# Patient Record
Sex: Female | Born: 1965
Health system: Southern US, Community
[De-identification: ages and names within clinical notes are randomized; demographics above are authoritative.]

## PROBLEM LIST (undated history)

## (undated) DIAGNOSIS — M797 Fibromyalgia: Secondary | ICD-10-CM

## (undated) DIAGNOSIS — W19XXXA Unspecified fall, initial encounter: Secondary | ICD-10-CM

## (undated) DIAGNOSIS — IMO0001 Reserved for inherently not codable concepts without codable children: Secondary | ICD-10-CM

## (undated) DIAGNOSIS — E669 Obesity, unspecified: Secondary | ICD-10-CM

## (undated) DIAGNOSIS — S0300XA Dislocation of jaw, unspecified side, initial encounter: Secondary | ICD-10-CM

## (undated) DIAGNOSIS — F40240 Claustrophobia: Secondary | ICD-10-CM

## (undated) DIAGNOSIS — N390 Urinary tract infection, site not specified: Secondary | ICD-10-CM

## (undated) DIAGNOSIS — N200 Calculus of kidney: Secondary | ICD-10-CM

## (undated) DIAGNOSIS — R000 Tachycardia, unspecified: Secondary | ICD-10-CM

## (undated) DIAGNOSIS — M1990 Unspecified osteoarthritis, unspecified site: Secondary | ICD-10-CM

## (undated) DIAGNOSIS — K219 Gastro-esophageal reflux disease without esophagitis: Secondary | ICD-10-CM

## (undated) DIAGNOSIS — J45909 Unspecified asthma, uncomplicated: Secondary | ICD-10-CM

## (undated) DIAGNOSIS — E119 Type 2 diabetes mellitus without complications: Secondary | ICD-10-CM

## (undated) DIAGNOSIS — D649 Anemia, unspecified: Secondary | ICD-10-CM

## (undated) DIAGNOSIS — E282 Polycystic ovarian syndrome: Secondary | ICD-10-CM

## (undated) DIAGNOSIS — F419 Anxiety disorder, unspecified: Secondary | ICD-10-CM

## (undated) DIAGNOSIS — F32A Depression, unspecified: Secondary | ICD-10-CM

## (undated) DIAGNOSIS — T7840XA Allergy, unspecified, initial encounter: Secondary | ICD-10-CM

## (undated) DIAGNOSIS — R61 Generalized hyperhidrosis: Secondary | ICD-10-CM

## (undated) DIAGNOSIS — F329 Major depressive disorder, single episode, unspecified: Secondary | ICD-10-CM

## (undated) DIAGNOSIS — K760 Fatty (change of) liver, not elsewhere classified: Secondary | ICD-10-CM

## (undated) DIAGNOSIS — R82993 Hyperuricosuria: Secondary | ICD-10-CM

## (undated) DIAGNOSIS — G4730 Sleep apnea, unspecified: Secondary | ICD-10-CM

## (undated) DIAGNOSIS — I890 Lymphedema, not elsewhere classified: Secondary | ICD-10-CM

## (undated) DIAGNOSIS — E785 Hyperlipidemia, unspecified: Secondary | ICD-10-CM

## (undated) HISTORY — PX: ESOPHAGOGASTRODUODENOSCOPY: SHX1529

## (undated) HISTORY — DX: Allergy, unspecified, initial encounter: T78.40XA

## (undated) HISTORY — PX: SEPTOPLASTY: SUR1290

## (undated) HISTORY — DX: Hyperlipidemia, unspecified: E78.5

## (undated) HISTORY — DX: Obesity, unspecified: E66.9

## (undated) HISTORY — PX: TONSILLECTOMY: SUR1361

## (undated) HISTORY — DX: Polycystic ovarian syndrome: E28.2

## (undated) HISTORY — DX: Unspecified fall, initial encounter: W19.XXXA

## (undated) HISTORY — PX: VAGUS NERVE STIMULATOR INSERTION: SHX348

## (undated) HISTORY — DX: Type 2 diabetes mellitus without complications: E11.9

## (undated) HISTORY — DX: Tachycardia, unspecified: R00.0

## (undated) HISTORY — DX: Fatty (change of) liver, not elsewhere classified: K76.0

## (undated) HISTORY — DX: Depression, unspecified: F32.A

## (undated) HISTORY — DX: Gastro-esophageal reflux disease without esophagitis: K21.9

## (undated) HISTORY — DX: Unspecified osteoarthritis, unspecified site: M19.90

## (undated) HISTORY — DX: Anxiety disorder, unspecified: F41.9

## (undated) HISTORY — DX: Lymphedema, not elsewhere classified: I89.0

## (undated) HISTORY — DX: Generalized hyperhidrosis: R61

## (undated) HISTORY — DX: Sleep apnea, unspecified: G47.30

## (undated) HISTORY — DX: Major depressive disorder, single episode, unspecified: F32.9

## (undated) HISTORY — PX: FOOT SURGERY: SHX648

---

## 1999-08-03 ENCOUNTER — Encounter: Payer: Self-pay | Admitting: Family Medicine

## 1999-08-03 ENCOUNTER — Encounter: Payer: Self-pay | Admitting: Emergency Medicine

## 1999-08-03 ENCOUNTER — Emergency Department (HOSPITAL_COMMUNITY): Admission: EM | Admit: 1999-08-03 | Discharge: 1999-08-03 | Payer: Self-pay | Admitting: Emergency Medicine

## 2001-07-04 ENCOUNTER — Encounter: Payer: Self-pay | Admitting: Family Medicine

## 2001-07-04 ENCOUNTER — Other Ambulatory Visit: Admission: RE | Admit: 2001-07-04 | Discharge: 2001-07-04 | Payer: Self-pay | Admitting: Family Medicine

## 2001-07-04 LAB — CONVERTED CEMR LAB: Pap Smear: NORMAL

## 2001-09-12 HISTORY — PX: OTHER SURGICAL HISTORY: SHX169

## 2001-10-14 ENCOUNTER — Encounter: Payer: Self-pay | Admitting: Gastroenterology

## 2001-10-17 ENCOUNTER — Ambulatory Visit (HOSPITAL_COMMUNITY): Admission: RE | Admit: 2001-10-17 | Discharge: 2001-10-17 | Payer: Self-pay | Admitting: Gastroenterology

## 2001-10-17 ENCOUNTER — Encounter: Payer: Self-pay | Admitting: Gastroenterology

## 2002-12-05 ENCOUNTER — Inpatient Hospital Stay (HOSPITAL_COMMUNITY): Admission: AD | Admit: 2002-12-05 | Discharge: 2002-12-07 | Payer: Self-pay | Admitting: Obstetrics and Gynecology

## 2002-12-05 ENCOUNTER — Encounter: Payer: Self-pay | Admitting: Obstetrics and Gynecology

## 2003-07-20 ENCOUNTER — Other Ambulatory Visit: Payer: Self-pay

## 2003-12-28 ENCOUNTER — Ambulatory Visit: Payer: Self-pay | Admitting: Family Medicine

## 2004-01-13 HISTORY — PX: ENDOMETRIAL BIOPSY: SHX622

## 2004-02-01 ENCOUNTER — Ambulatory Visit: Payer: Self-pay | Admitting: Family Medicine

## 2004-02-23 ENCOUNTER — Ambulatory Visit: Payer: Self-pay | Admitting: Family Medicine

## 2004-05-31 ENCOUNTER — Ambulatory Visit: Payer: Self-pay | Admitting: Family Medicine

## 2004-06-30 ENCOUNTER — Ambulatory Visit: Payer: Self-pay | Admitting: Family Medicine

## 2004-10-10 ENCOUNTER — Ambulatory Visit: Payer: Self-pay | Admitting: Family Medicine

## 2004-12-27 ENCOUNTER — Ambulatory Visit: Payer: Self-pay | Admitting: Internal Medicine

## 2005-01-03 ENCOUNTER — Ambulatory Visit: Payer: Self-pay | Admitting: Family Medicine

## 2005-01-31 ENCOUNTER — Ambulatory Visit: Payer: Self-pay | Admitting: Family Medicine

## 2005-02-02 ENCOUNTER — Ambulatory Visit: Payer: Self-pay | Admitting: Otolaryngology

## 2005-04-12 ENCOUNTER — Ambulatory Visit: Payer: Self-pay | Admitting: Family Medicine

## 2005-05-08 ENCOUNTER — Ambulatory Visit: Payer: Self-pay | Admitting: Family Medicine

## 2005-05-31 ENCOUNTER — Ambulatory Visit: Payer: Self-pay | Admitting: Family Medicine

## 2005-06-12 HISTORY — PX: UTERINE FIBROID SURGERY: SHX826

## 2005-06-15 ENCOUNTER — Ambulatory Visit: Payer: Self-pay | Admitting: Unknown Physician Specialty

## 2005-09-13 ENCOUNTER — Ambulatory Visit: Payer: Self-pay | Admitting: Family Medicine

## 2006-01-08 ENCOUNTER — Ambulatory Visit: Payer: Self-pay | Admitting: Family Medicine

## 2006-03-12 ENCOUNTER — Ambulatory Visit: Payer: Self-pay | Admitting: Family Medicine

## 2006-03-12 LAB — CONVERTED CEMR LAB
ALT: 11 units/L (ref 0–40)
AST: 17 units/L (ref 0–37)
Cholesterol: 156 mg/dL (ref 0–200)
Direct LDL: 77.7 mg/dL
HDL: 53.2 mg/dL (ref >39.0)
Hgb A1c MFr Bld: 5.6 % (ref 4.6–6.0)
Total CHOL/HDL Ratio: 2.9
Triglycerides: 213 mg/dL (ref 0–149)
VLDL: 43 mg/dL — ABNORMAL HIGH (ref 0–40)

## 2006-04-23 ENCOUNTER — Ambulatory Visit: Payer: Self-pay | Admitting: Family Medicine

## 2006-05-06 ENCOUNTER — Encounter: Payer: Self-pay | Admitting: Family Medicine

## 2006-05-14 ENCOUNTER — Encounter: Payer: Self-pay | Admitting: Family Medicine

## 2006-08-07 ENCOUNTER — Ambulatory Visit: Payer: Self-pay | Admitting: Otolaryngology

## 2006-10-16 ENCOUNTER — Encounter: Payer: Self-pay | Admitting: Family Medicine

## 2006-10-16 ENCOUNTER — Ambulatory Visit: Payer: Self-pay | Admitting: Pulmonary Disease

## 2006-10-16 DIAGNOSIS — F502 Bulimia nervosa, unspecified: Secondary | ICD-10-CM | POA: Insufficient documentation

## 2006-10-16 DIAGNOSIS — J4520 Mild intermittent asthma, uncomplicated: Secondary | ICD-10-CM | POA: Insufficient documentation

## 2006-10-16 DIAGNOSIS — G5600 Carpal tunnel syndrome, unspecified upper limb: Secondary | ICD-10-CM

## 2006-10-16 DIAGNOSIS — E282 Polycystic ovarian syndrome: Secondary | ICD-10-CM | POA: Insufficient documentation

## 2006-10-16 DIAGNOSIS — M26609 Unspecified temporomandibular joint disorder, unspecified side: Secondary | ICD-10-CM | POA: Insufficient documentation

## 2006-10-16 DIAGNOSIS — M1990 Unspecified osteoarthritis, unspecified site: Secondary | ICD-10-CM | POA: Insufficient documentation

## 2006-10-16 DIAGNOSIS — I890 Lymphedema, not elsewhere classified: Secondary | ICD-10-CM | POA: Insufficient documentation

## 2006-10-16 DIAGNOSIS — G44209 Tension-type headache, unspecified, not intractable: Secondary | ICD-10-CM

## 2006-10-16 DIAGNOSIS — K589 Irritable bowel syndrome without diarrhea: Secondary | ICD-10-CM

## 2006-10-16 DIAGNOSIS — J309 Allergic rhinitis, unspecified: Secondary | ICD-10-CM

## 2006-10-16 DIAGNOSIS — K219 Gastro-esophageal reflux disease without esophagitis: Secondary | ICD-10-CM | POA: Insufficient documentation

## 2006-10-16 DIAGNOSIS — R05 Cough: Secondary | ICD-10-CM

## 2006-10-16 DIAGNOSIS — E785 Hyperlipidemia, unspecified: Secondary | ICD-10-CM

## 2006-10-16 DIAGNOSIS — IMO0001 Reserved for inherently not codable concepts without codable children: Secondary | ICD-10-CM

## 2006-10-16 DIAGNOSIS — F410 Panic disorder [episodic paroxysmal anxiety] without agoraphobia: Secondary | ICD-10-CM

## 2006-10-16 DIAGNOSIS — F411 Generalized anxiety disorder: Secondary | ICD-10-CM

## 2006-10-16 DIAGNOSIS — Z6841 Body Mass Index (BMI) 40.0 and over, adult: Secondary | ICD-10-CM

## 2006-10-16 DIAGNOSIS — F329 Major depressive disorder, single episode, unspecified: Secondary | ICD-10-CM | POA: Insufficient documentation

## 2006-10-30 ENCOUNTER — Ambulatory Visit: Payer: Self-pay | Admitting: Family Medicine

## 2006-10-31 ENCOUNTER — Encounter: Payer: Self-pay | Admitting: Family Medicine

## 2006-11-15 DIAGNOSIS — R5382 Chronic fatigue, unspecified: Secondary | ICD-10-CM

## 2006-11-15 DIAGNOSIS — Z8719 Personal history of other diseases of the digestive system: Secondary | ICD-10-CM

## 2006-11-28 ENCOUNTER — Ambulatory Visit: Payer: Self-pay | Admitting: Pulmonary Disease

## 2006-12-23 ENCOUNTER — Telehealth: Payer: Self-pay | Admitting: Family Medicine

## 2007-02-11 ENCOUNTER — Telehealth: Payer: Self-pay | Admitting: Family Medicine

## 2007-05-09 ENCOUNTER — Encounter: Payer: Self-pay | Admitting: Family Medicine

## 2007-05-12 ENCOUNTER — Telehealth (INDEPENDENT_AMBULATORY_CARE_PROVIDER_SITE_OTHER): Payer: Self-pay | Admitting: *Deleted

## 2007-05-14 ENCOUNTER — Encounter: Payer: Self-pay | Admitting: Family Medicine

## 2007-05-22 ENCOUNTER — Encounter: Payer: Self-pay | Admitting: Family Medicine

## 2007-05-22 ENCOUNTER — Telehealth: Payer: Self-pay | Admitting: Family Medicine

## 2007-05-29 ENCOUNTER — Ambulatory Visit: Payer: Self-pay | Admitting: Family Medicine

## 2007-05-30 LAB — CONVERTED CEMR LAB
Basophils Absolute: 0.1 10*3/uL
Basophils Relative: 0.7 %
Eosinophils Absolute: 0.1 10*3/uL
Eosinophils Relative: 1.9 %
Folate: 5.4 ng/mL
Free T4: 0.7 ng/dL
HCT: 37.8 %
Hemoglobin: 12.8 g/dL
Hgb A1c MFr Bld: 5.8 %
Lymphocytes Relative: 26 %
MCHC: 33.8 g/dL
MCV: 93.4 fL
Monocytes Absolute: 0.4 10*3/uL
Monocytes Relative: 5.8 %
Neutro Abs: 5.1 10*3/uL
Neutrophils Relative %: 65.6 %
Platelets: 231 10*3/uL
RBC: 4.05 M/uL
RDW: 13 %
TSH: 3.67 u[IU]/mL
Vitamin B-12: 140 pg/mL — ABNORMAL LOW
WBC: 7.7 10*3/uL

## 2007-06-04 ENCOUNTER — Ambulatory Visit: Payer: Self-pay | Admitting: Family Medicine

## 2007-06-04 DIAGNOSIS — E538 Deficiency of other specified B group vitamins: Secondary | ICD-10-CM

## 2007-06-10 ENCOUNTER — Telehealth (INDEPENDENT_AMBULATORY_CARE_PROVIDER_SITE_OTHER): Payer: Self-pay | Admitting: *Deleted

## 2007-06-12 ENCOUNTER — Telehealth: Payer: Self-pay | Admitting: Family Medicine

## 2007-06-25 ENCOUNTER — Emergency Department (HOSPITAL_COMMUNITY): Admission: EM | Admit: 2007-06-25 | Discharge: 2007-06-25 | Payer: Self-pay | Admitting: Emergency Medicine

## 2007-06-27 ENCOUNTER — Encounter: Payer: Self-pay | Admitting: Family Medicine

## 2007-07-17 ENCOUNTER — Ambulatory Visit: Payer: Self-pay | Admitting: Family Medicine

## 2007-07-17 ENCOUNTER — Telehealth: Payer: Self-pay | Admitting: Family Medicine

## 2007-09-12 ENCOUNTER — Telehealth (INDEPENDENT_AMBULATORY_CARE_PROVIDER_SITE_OTHER): Payer: Self-pay | Admitting: *Deleted

## 2007-10-10 ENCOUNTER — Telehealth (INDEPENDENT_AMBULATORY_CARE_PROVIDER_SITE_OTHER): Payer: Self-pay | Admitting: *Deleted

## 2007-10-13 ENCOUNTER — Encounter: Payer: Self-pay | Admitting: Family Medicine

## 2007-11-02 ENCOUNTER — Encounter: Payer: Self-pay | Admitting: Pulmonary Disease

## 2007-12-30 ENCOUNTER — Ambulatory Visit: Payer: Self-pay | Admitting: Family Medicine

## 2008-01-01 ENCOUNTER — Telehealth: Payer: Self-pay | Admitting: Family Medicine

## 2008-02-16 ENCOUNTER — Telehealth (INDEPENDENT_AMBULATORY_CARE_PROVIDER_SITE_OTHER): Payer: Self-pay | Admitting: *Deleted

## 2008-04-14 ENCOUNTER — Telehealth: Payer: Self-pay | Admitting: Family Medicine

## 2008-05-07 ENCOUNTER — Ambulatory Visit: Payer: Self-pay | Admitting: Family Medicine

## 2008-05-07 DIAGNOSIS — E559 Vitamin D deficiency, unspecified: Secondary | ICD-10-CM | POA: Insufficient documentation

## 2008-05-17 ENCOUNTER — Ambulatory Visit: Payer: Self-pay | Admitting: Family Medicine

## 2008-05-18 LAB — CONVERTED CEMR LAB: Vit D, 25-Hydroxy: 15 ng/mL — ABNORMAL LOW (ref 30–89)

## 2008-05-19 LAB — CONVERTED CEMR LAB
ALT: 13 units/L (ref 0–35)
AST: 18 units/L (ref 0–37)
Albumin: 3.3 g/dL — ABNORMAL LOW (ref 3.5–5.2)
Alkaline Phosphatase: 59 units/L (ref 39–117)
BUN: 13 mg/dL (ref 6–23)
Basophils Absolute: 0 10*3/uL (ref 0.0–0.1)
Basophils Relative: 0.6 % (ref 0.0–3.0)
Bilirubin, Direct: 0.1 mg/dL (ref 0.0–0.3)
CO2: 25 meq/L (ref 19–32)
Calcium: 9.4 mg/dL (ref 8.4–10.5)
Chloride: 110 meq/L (ref 96–112)
Cholesterol: 145 mg/dL (ref 0–200)
Creatinine, Ser: 0.9 mg/dL (ref 0.4–1.2)
Eosinophils Absolute: 0.2 10*3/uL (ref 0.0–0.7)
Eosinophils Relative: 2.9 % (ref 0.0–5.0)
GFR calc non Af Amer: 72.69 mL/min (ref >60)
Glucose, Bld: 142 mg/dL — ABNORMAL HIGH (ref 70–99)
HCT: 40.3 % (ref 36.0–46.0)
HDL: 43.4 mg/dL (ref >39.00)
Hemoglobin: 13.3 g/dL (ref 12.0–15.0)
Hgb A1c MFr Bld: 6.8 % — ABNORMAL HIGH (ref 4.6–6.5)
LDL Cholesterol: 68 mg/dL (ref 0–99)
Lymphocytes Relative: 25.3 % (ref 12.0–46.0)
Lymphs Abs: 1.5 10*3/uL (ref 0.7–4.0)
MCHC: 33 g/dL (ref 30.0–36.0)
MCV: 93.6 fL (ref 78.0–100.0)
Monocytes Absolute: 0.4 10*3/uL (ref 0.1–1.0)
Monocytes Relative: 6 % (ref 3.0–12.0)
Neutro Abs: 3.8 10*3/uL (ref 1.4–7.7)
Neutrophils Relative %: 65.2 % (ref 43.0–77.0)
Platelets: 205 10*3/uL (ref 150.0–400.0)
Potassium: 4.5 meq/L (ref 3.5–5.1)
RBC: 4.31 M/uL (ref 3.87–5.11)
RDW: 13.1 % (ref 11.5–14.6)
Sodium: 142 meq/L (ref 135–145)
TSH: 3.45 microintl units/mL (ref 0.35–5.50)
Total Bilirubin: 0.4 mg/dL (ref 0.3–1.2)
Total CHOL/HDL Ratio: 3
Total Protein: 5.9 g/dL — ABNORMAL LOW (ref 6.0–8.3)
Triglycerides: 169 mg/dL — ABNORMAL HIGH (ref 0.0–149.0)
VLDL: 33.8 mg/dL (ref 0.0–40.0)
Vitamin B-12: 403 pg/mL (ref 211–911)
WBC: 5.9 10*3/uL (ref 4.5–10.5)

## 2008-06-01 ENCOUNTER — Encounter: Payer: Self-pay | Admitting: Family Medicine

## 2008-06-04 ENCOUNTER — Telehealth: Payer: Self-pay | Admitting: Family Medicine

## 2008-06-21 ENCOUNTER — Ambulatory Visit: Payer: Self-pay | Admitting: Family Medicine

## 2008-07-13 DIAGNOSIS — K760 Fatty (change of) liver, not elsewhere classified: Secondary | ICD-10-CM

## 2008-07-13 HISTORY — DX: Fatty (change of) liver, not elsewhere classified: K76.0

## 2008-07-29 ENCOUNTER — Ambulatory Visit: Payer: Self-pay | Admitting: Family Medicine

## 2008-07-29 DIAGNOSIS — K6289 Other specified diseases of anus and rectum: Secondary | ICD-10-CM

## 2008-07-29 DIAGNOSIS — R197 Diarrhea, unspecified: Secondary | ICD-10-CM

## 2008-08-10 ENCOUNTER — Encounter: Payer: Self-pay | Admitting: Family Medicine

## 2008-08-11 ENCOUNTER — Ambulatory Visit: Payer: Self-pay | Admitting: Family Medicine

## 2008-08-12 ENCOUNTER — Encounter: Payer: Self-pay | Admitting: Family Medicine

## 2008-08-19 ENCOUNTER — Telehealth (INDEPENDENT_AMBULATORY_CARE_PROVIDER_SITE_OTHER): Payer: Self-pay | Admitting: *Deleted

## 2008-08-19 ENCOUNTER — Telehealth: Payer: Self-pay | Admitting: Gastroenterology

## 2008-08-19 ENCOUNTER — Telehealth: Payer: Self-pay | Admitting: Family Medicine

## 2008-08-20 ENCOUNTER — Encounter: Payer: Self-pay | Admitting: Family Medicine

## 2008-08-23 ENCOUNTER — Telehealth: Payer: Self-pay | Admitting: Family Medicine

## 2008-08-23 ENCOUNTER — Ambulatory Visit: Payer: Self-pay | Admitting: Family Medicine

## 2008-08-23 DIAGNOSIS — E119 Type 2 diabetes mellitus without complications: Secondary | ICD-10-CM | POA: Insufficient documentation

## 2008-08-24 ENCOUNTER — Encounter: Payer: Self-pay | Admitting: Family Medicine

## 2008-08-24 LAB — CONVERTED CEMR LAB
AST: 26 units/L (ref 0–37)
CO2: 27 meq/L (ref 19–32)
Calcium: 8.6 mg/dL (ref 8.4–10.5)
HDL: 42.3 mg/dL (ref >39.00)
Hgb A1c MFr Bld: 6.4 % (ref 4.6–6.5)
Potassium: 4.3 meq/L (ref 3.5–5.1)
Sodium: 142 meq/L (ref 135–145)

## 2008-08-25 ENCOUNTER — Encounter: Payer: Self-pay | Admitting: Family Medicine

## 2008-08-26 ENCOUNTER — Telehealth: Payer: Self-pay | Admitting: Family Medicine

## 2008-09-02 ENCOUNTER — Ambulatory Visit: Payer: Self-pay | Admitting: Unknown Physician Specialty

## 2008-09-02 ENCOUNTER — Encounter: Payer: Self-pay | Admitting: Family Medicine

## 2008-09-10 ENCOUNTER — Ambulatory Visit: Payer: Self-pay | Admitting: Family Medicine

## 2008-09-10 ENCOUNTER — Encounter (INDEPENDENT_AMBULATORY_CARE_PROVIDER_SITE_OTHER): Payer: Self-pay | Admitting: Internal Medicine

## 2008-09-15 ENCOUNTER — Encounter: Payer: Self-pay | Admitting: Family Medicine

## 2008-11-26 ENCOUNTER — Ambulatory Visit: Payer: Self-pay | Admitting: Internal Medicine

## 2008-11-26 LAB — CONVERTED CEMR LAB: Rapid Strep: POSITIVE

## 2008-12-17 ENCOUNTER — Telehealth: Payer: Self-pay | Admitting: Family Medicine

## 2008-12-22 ENCOUNTER — Ambulatory Visit: Payer: Self-pay | Admitting: Family Medicine

## 2008-12-22 ENCOUNTER — Telehealth: Payer: Self-pay | Admitting: Family Medicine

## 2008-12-23 ENCOUNTER — Telehealth: Payer: Self-pay | Admitting: Family Medicine

## 2008-12-24 ENCOUNTER — Ambulatory Visit: Payer: Self-pay | Admitting: Family Medicine

## 2008-12-31 LAB — CONVERTED CEMR LAB: Potassium: 4.8 meq/L (ref 3.5–5.1)

## 2009-01-11 ENCOUNTER — Ambulatory Visit: Payer: Self-pay | Admitting: Family Medicine

## 2009-01-18 ENCOUNTER — Ambulatory Visit: Payer: Self-pay | Admitting: Family Medicine

## 2009-01-21 ENCOUNTER — Ambulatory Visit: Payer: Self-pay | Admitting: Cardiology

## 2009-01-25 ENCOUNTER — Ambulatory Visit: Payer: Self-pay

## 2009-01-25 ENCOUNTER — Encounter: Payer: Self-pay | Admitting: Cardiology

## 2009-02-07 ENCOUNTER — Ambulatory Visit: Payer: Self-pay | Admitting: Cardiology

## 2009-02-07 ENCOUNTER — Encounter: Payer: Self-pay | Admitting: Cardiology

## 2009-02-14 ENCOUNTER — Encounter: Payer: Self-pay | Admitting: Cardiology

## 2009-03-04 ENCOUNTER — Telehealth: Payer: Self-pay | Admitting: Family Medicine

## 2009-03-07 ENCOUNTER — Telehealth (INDEPENDENT_AMBULATORY_CARE_PROVIDER_SITE_OTHER): Payer: Self-pay | Admitting: *Deleted

## 2009-03-07 ENCOUNTER — Ambulatory Visit: Payer: Self-pay | Admitting: Family Medicine

## 2009-03-07 LAB — CONVERTED CEMR LAB
Albumin: 3.1 g/dL — ABNORMAL LOW (ref 3.5–5.2)
Calcium: 9.3 mg/dL (ref 8.4–10.5)
Chloride: 105 meq/L (ref 96–112)
Cholesterol: 156 mg/dL (ref 0–200)
Direct LDL: 84.9 mg/dL
Phosphorus: 4 mg/dL (ref 2.3–4.6)
Potassium: 4 meq/L (ref 3.5–5.1)
Triglycerides: 227 mg/dL — ABNORMAL HIGH (ref 0.0–149.0)
Vitamin B-12: 293 pg/mL (ref 211–911)

## 2009-03-23 ENCOUNTER — Ambulatory Visit: Payer: Self-pay | Admitting: Family Medicine

## 2009-06-14 ENCOUNTER — Telehealth: Payer: Self-pay | Admitting: Family Medicine

## 2009-06-17 ENCOUNTER — Ambulatory Visit: Payer: Self-pay | Admitting: Family Medicine

## 2009-06-17 DIAGNOSIS — L820 Inflamed seborrheic keratosis: Secondary | ICD-10-CM | POA: Insufficient documentation

## 2009-06-17 DIAGNOSIS — N951 Menopausal and female climacteric states: Secondary | ICD-10-CM | POA: Insufficient documentation

## 2009-06-20 LAB — CONVERTED CEMR LAB
AST: 17 units/L (ref 0–37)
Albumin: 3.6 g/dL (ref 3.5–5.2)
Alkaline Phosphatase: 60 units/L (ref 39–117)
BUN: 12 mg/dL (ref 6–23)
HDL: 49 mg/dL (ref >39)
LDL Cholesterol: 44 mg/dL (ref 0–99)
Potassium: 4.4 meq/L (ref 3.5–5.3)
Sodium: 139 meq/L (ref 135–145)
TSH: 2.243 microintl units/mL (ref 0.350–4.500)
Total Bilirubin: 0.2 mg/dL — ABNORMAL LOW (ref 0.3–1.2)
VLDL: 59 mg/dL — ABNORMAL HIGH (ref 0–40)
Vit D, 25-Hydroxy: 37 ng/mL (ref 30–89)

## 2009-07-18 ENCOUNTER — Telehealth: Payer: Self-pay | Admitting: Family Medicine

## 2009-07-18 ENCOUNTER — Encounter (INDEPENDENT_AMBULATORY_CARE_PROVIDER_SITE_OTHER): Payer: Self-pay | Admitting: *Deleted

## 2009-09-21 ENCOUNTER — Ambulatory Visit: Payer: Self-pay | Admitting: Family Medicine

## 2009-09-21 DIAGNOSIS — L538 Other specified erythematous conditions: Secondary | ICD-10-CM

## 2009-10-20 ENCOUNTER — Telehealth: Payer: Self-pay | Admitting: Family Medicine

## 2009-10-27 ENCOUNTER — Ambulatory Visit: Payer: Self-pay | Admitting: Family Medicine

## 2009-10-28 ENCOUNTER — Ambulatory Visit: Payer: Self-pay | Admitting: Family Medicine

## 2009-10-28 DIAGNOSIS — L919 Hypertrophic disorder of the skin, unspecified: Secondary | ICD-10-CM

## 2009-10-28 DIAGNOSIS — L909 Atrophic disorder of skin, unspecified: Secondary | ICD-10-CM | POA: Insufficient documentation

## 2009-11-04 ENCOUNTER — Telehealth: Payer: Self-pay | Admitting: Family Medicine

## 2009-11-07 ENCOUNTER — Ambulatory Visit: Payer: Self-pay | Admitting: Family Medicine

## 2009-11-07 DIAGNOSIS — R61 Generalized hyperhidrosis: Secondary | ICD-10-CM

## 2010-03-12 LAB — CONVERTED CEMR LAB
Platelets: 241 10*3/uL (ref 150–400)
RDW: 14.1 % (ref 11.5–15.5)

## 2010-03-14 NOTE — Assessment & Plan Note (Signed)
Summary: RECHECK WHERE SKIN TAG REMOVED/RI   Vital Signs:  Patient profile:   45 year old female Height:      64 inches Temp:     98.3 degrees F oral Pulse rate:   88 / minute Pulse rhythm:   regular BP sitting:   122 / 90  (left arm) Cuff size:   large  Vitals Entered By: Lewanda Rife LPN (November 07, 2009 3:11 PM) CC: Ck tag mole that was removed.   History of Present Illness: area where skin tag was removed  ? infected - is sore -- maybe a little swollen  she herself cannot see it  mother looked at it - ? whether infected  using the bactroban ointment when someone can help her put it on  sweats a lot in that area - has tried otc antipersperant/deoderants and powders  no more itching  has fungal powder to use  also sweats under her breast - ?anything to try, is very bothersome  is weight related   no fever - but eyes have been burning  that may be from allergies -worse when she gets up     Allergies: 1)  ! * Tape 2)  ! Zoloft 3)  Codeine 4)  * Ambien 5)  Halcion 6)  Prednisone 7)  * Ortho Evra 8)  Hydrocodone 9)  * Cymbalta 10)  Keflex 11)  * Seroquel 12)  * Band Aids 13)  * Sonata  Past History:  Past Surgical History: Last updated: 09/08/2008 Tonsillectomy Septoplasty Foot surgery EGD- neg (06/2001) Uterine tumor (09/2001) EGD- normal (09/2001) MRI C-spine-  normal (04/2003) Vagus nerve stimulator implanted Endometrial biopsy (01/2004) Uterine fibroid (06/2005)- uterine ablation Abd [place] - normal (04/2006) abd [place] - fatty liver, slt dilated cbd (no stones) 6/10 7/10 colonoscopy- polyp 7/10 EGD - erythematous mucosa, polyp  Family History: Last updated: 02/05/09 Father: HTN, deceased- pancreatic cancer Mother:  Siblings: sister with HTN Grandmother with MI in her 53s, other grandmother with MI in her 49s.   Social History: Last updated: 02/05/2009 Marital Status: single Children: none Occupation: disabled Nonsmoker No drugs 1  caffeinated beverage daily No ETOH  Risk Factors: Alcohol Use: <1 (02-05-09) Caffeine Use: 3 cups  (05-Feb-2009) Exercise: no (February 05, 2009)  Risk Factors: Smoking Status: never (05-Feb-2009)  Past Medical History: 1. Allergic rhinitis 2. Anxiety 3. Depression 4. GERD 5. Hyperlipidemia 6. Osteoarthritis 7. morbid obesity 8. Lymphedema 9. Tachycardia 10. Fibromyalgia hyperhidrosis  psychiatry GI- [facility] Jordan  Review of Systems General:  Complains of fatigue; denies chills, fever, loss of appetite, and malaise. Eyes:  Complains of itching; denies blurring. ENT:  Complains of postnasal drainage; from allergies. Resp:  Denies cough. GI:  Denies abdominal pain, nausea, and vomiting. Derm:  Complains of excessive perspiration, lesion(s), and poor wound healing; denies itching and rash. Heme:  Denies abnormal bruising and bleeding.  Physical Exam  General:  morbidly obese but well appearing Head:  normocephalic, atraumatic, and no abnormalities observed.   Neck:  supple with full rom and no masses or thyromegally, no JVD or carotid bruit  Lungs:  Normal respiratory effort, chest expands symmetrically. Lungs are clear to auscultation, no crackles or wheezes. Abdomen:  soft and non-tender.   Skin:  skin tag sites clear  larger one has 1-2 mm of slt redness surrounding scar no open areas or drainage  no rash noted this time  much sweating in skin creases - but better hygiene   Inguinal Nodes:  No significant adenopathy Psych:  nl  affect today   Impression & Recommendations:  Problem # 1:  SKIN TAG (ICD-701.9) Assessment Improved  per pt irritated as it heals- and she could not see / came in for a re check wound looks to be healing well / not infected appearing  adv to continue the bactroban until healed - and have someone check regularly for redness/ drainage or update if pain  Orders: Prescription Created Electronically [phone])  Problem # 2:  HYPERHIDROSIS  (ICD-780.8) Assessment: New  for the most part due to obesity - under breasts and in groin under pannus  this sets her up for frequent irritation and yeast infections  will tx with drysol at night and update with results   Orders: Prescription Created Electronically [phone])  Complete Medication List: 1)  Spironolactone 50 Mg Tabs (Spironolactone) .... One by mouth once daily 2)  Xanax 1 Mg Tabs (Alprazolam) .... Two tablets at bedtime and as needed during the day 3)  Yasmin 28 3-0.03 Mg Tabs (Drospirenone-ethinyl estradiol) .... Continuous dosing take by mouth as directed 4)  Simvastatin 80 Mg Tabs (Simvastatin) .... One by mouth once daily 5)  Prilosec 40 Mg Cpdr (Omeprazole) .Marland Kitchen.. 1 by mouth twice daily 6)  Trazodone Hcl 150 Mg Tabs (Trazodone hcl) .... 2-3 tablets by mouth at bedtime 7)  Lidoderm 5 % Ptch (Lidocaine) .... Apply once daily (12 hours on and 12 hours off) to aff areas as directed as needed pain 8)  Cobal-1000 1000 Mcg/ml Inj Soln (Cyanocobalamin) .Marland Kitchen.. 1 ml (1000 micrograms) im as directed every  month 9)  Tramadol Hcl 50 Mg Tabs (Tramadol hcl) .Marland Kitchen.. 1 every 6 hrs as needed pain, may combine with 1 tylenol 500mg   by mouth 10)  Tylenol Extra Strength 500 Mg Tabs (Acetaminophen) .... Otc as directed. 11)  Topicort 0.25 % Crea (Desoximetasone) .... Apply as directed 12)  Refenesen 400 400 Mg Tabs (Guaifenesin) .... Otc as directed. 13)  Prozac 40 Mg Caps (Fluoxetine hcl) .Marland Kitchen.. 1 by mouth daily 14)  Flexeril 5 Mg Tabs (Cyclobenzaprine hcl) .... 1/2 to 1 tab up to three times a day as needed for neck pain and spasm 15)  Tessalon Perles 100 Mg Caps (Benzonatate) .Marland Kitchen.. 1-2 by mouth up to three times a day as needed cough 16)  Vitamin D 1000 Iu  .Marland Kitchen.. 1 by mouth once daily 17)  Clonidine Hcl 0.1 Mg Tabs (Clonidine hcl) .... Take 1 tablet by mouth twice a day 18)  Nystop 100000 Unit/gm Powd (Nystatin) .... Apply to affected area two times a day until clear as needed 19)  Ketoconazole 2  % Crea (Ketoconazole) .... Apply to aff area once daily for 2 weeks 20)  Bactroban 2 % Oint (Mupirocin) .... Apply to affected area two times a day 21)  Clear Eyes Redness Relief 0.012-0.2 % Soln (Naphazoline-glycerin) .... Otc as directed. 22)  Drysol 20 % Soln (Aluminum chloride) .... Apply to affected areas at bedtime - then wash off in the am  once sweating improves- can use twice weekly  Patient Instructions: 1)  keep using the antibiotic ointment on the skin tag area  2)  after about a week(if improved)- can try the drysol for sweating and see if that helps Prescriptions: DRYSOL 20 % SOLN (ALUMINUM CHLORIDE) apply to affected areas at bedtime - then wash off in the am  once sweating improves- can use twice weekly  #1 medium x 3   Entered and Authorized by:   Judith Part MD   Signed  by:   Judith Part MD on 11/07/2009   Method used:   Electronically to        [facility]* (retail)       [address]       [place], [place]  [address]       Ph: [phone]       Fax: [phone]   RxID:   [id number]   Current Allergies (reviewed today): ! * TAPE ! ZOLOFT CODEINE * AMBIEN HALCION PREDNISONE * ORTHO EVRA HYDROCODONE * CYMBALTA KEFLEX * SEROQUEL * BAND AIDS * SONATA

## 2010-03-14 NOTE — Assessment & Plan Note (Signed)
Summary: KNOT ON HEAD, OTHER ISSUES/ 30 MIN/ lb   Vital Signs:  Patient profile:   45 year old female Height:      64 inches Weight:      383.25 pounds BMI:     66.02 Temp:     98.5 degrees F oral Pulse rate:   100 / minute Pulse rhythm:   regular BP sitting:   114 / 80  (left arm) Cuff size:   large  Vitals Entered By: Lewanda Rife LPN (Jun 18, 8411 3:51 PM) CC: knot on head on left side, skin feels warm, some fever and cramping   History of Present Illness: here for multiple issues   knot on head -- unsure if may be a tick bite-- (but never saw a tick)  bled when she scratched it    skin complaint  face stays hot all the time sometimes this happens in limbs and they feel really hot  does not have fever here -- but usually 97 never gets above 100 ? perimenopause  mother was 45 at menopause- early  sister went through in her 40s , other sister is 44 and not menop yet   no periods for years - on continuous birth control   horrible cramps in legs and arms  no swollen joints   fibro- ? if worse   R foot stays swollen has been a while since her last massage  Allergies: 1)  ! * Tape 2)  ! Zoloft 3)  Codeine 4)  * Ambien 5)  Halcion 6)  Prednisone 7)  * Ortho Evra 8)  Hydrocodone 9)  * Cymbalta 10)  Keflex 11)  * Seroquel 12)  * Band Aids 13)  * Sonata  Past History:  Past Medical History: Last updated: 2009-01-30 1. Allergic rhinitis 2. Anxiety 3. Depression 4. GERD 5. Hyperlipidemia 6. Osteoarthritis 7. morbid obesity 8. Lymphedema 9. Tachycardia 10. Fibromyalgia  psychiatry GI- [facility] Jordan  Past Surgical History: Last updated: 09/08/2008 Tonsillectomy Septoplasty Foot surgery EGD- neg (06/2001) Uterine tumor (09/2001) EGD- normal (09/2001) MRI C-spine-  normal (04/2003) Vagus nerve stimulator implanted Endometrial biopsy (01/2004) Uterine fibroid (06/2005)- uterine ablation Abd [place] - normal (04/2006) abd [place] - fatty liver, slt dilated cbd  (no stones) 6/10 7/10 colonoscopy- polyp 7/10 EGD - erythematous mucosa, polyp  Family History: Last updated: 01/30/09 Father: HTN, deceased- pancreatic cancer Mother:  Siblings: sister with HTN Grandmother with MI in her 83s, other grandmother with MI in her 69s.   Social History: Last updated: 2009-01-30 Marital Status: single Children: none Occupation: disabled Nonsmoker No drugs 1 caffeinated beverage daily No ETOH  Risk Factors: Alcohol Use: <1 (2009-01-30) Caffeine Use: 3 cups  (30-Jan-2009) Exercise: no (30-Jan-2009)  Risk Factors: Smoking Status: never (01-30-2009)  Review of Systems General:  Complains of fatigue; denies chills, fever, loss of appetite, and malaise. Eyes:  Denies blurring and eye irritation. CV:  Denies chest pain or discomfort, lightheadness, and palpitations. Resp:  Denies cough, pleuritic, shortness of breath, and wheezing. GI:  Denies abdominal pain, bloody stools, change in bowel habits, and indigestion. GU:  Denies abnormal vaginal bleeding, discharge, dysuria, and urinary frequency. MS:  Complains of joint pain and muscle aches; denies joint redness, joint swelling, cramps, and muscle weakness. Derm:  Denies itching, lesion(s), poor wound healing, and rash. Neuro:  Denies numbness and tingling. Psych:  Complains of anxiety and depression. Endo:  Denies excessive thirst and excessive urination. Heme:  Denies abnormal bruising, bleeding, enlarge lymph nodes, fevers, pallor,  and skin discoloration.  Physical Exam  General:  morbidly obese but well appearing Head:  normocephalic, atraumatic, and no abnormalities observed.   Eyes:  vision grossly intact, pupils equal, pupils round, and pupils reactive to light.  no conjunctival pallor, injection or icterus  Mouth:  pharynx pink and moist, no erythema, and no exudates.   Neck:  supple with full rom and no masses or thyromegally, no JVD or carotid bruit  Chest Wall:  No deformities, masses,  or tenderness noted. Lungs:  Normal respiratory effort, chest expands symmetrically. Lungs are clear to auscultation, no crackles or wheezes. Heart:  normal rate, regular rhythm, and no murmur.   Abdomen:  Bowel sounds positive,abdomen soft and non-tender without masses, organomegaly or hernias noted. no renal bruits  Msk:  No deformity or scoliosis noted of thoracic or lumbar spine.  poor rom knees bilat  Pulses:  R and L carotid,radial,femoral,dorsalis pedis and posterior tibial pulses are full and equal bilaterally Extremities:  No clubbing, cyanosis, edema, or deformity noted with normal full range of motion of all joints.   Neurologic:  sensation intact to light touch, gait normal, and DTRs symmetrical and normal.   Skin:  1 cm raised rough brown SK on L scalp with abrasion and dried blood  rest of scalp is clear  Cervical Nodes:  No lymphadenopathy noted Inguinal Nodes:  No significant adenopathy Psych:  less depressed today- more talkative   Diabetes Management Exam:    Foot Exam (with socks and/or shoes not present):       Sensory-Pinprick/Light touch:          Left medial foot (L-4): normal          Left dorsal foot (L-5): normal          Left lateral foot (S-1): normal          Right medial foot (L-4): normal          Right dorsal foot (L-5): normal          Right lateral foot (S-1): normal       Sensory-Monofilament:          Left foot: normal          Right foot: normal       Inspection:          Left foot: normal          Right foot: normal       Nails:          Left foot: normal          Right foot: normal   Impression & Recommendations:  Problem # 1:  SEBORRHEIC KERATOSIS, INFLAMED (ICD-702.11) Assessment New on scalp --irritated with dried blood adv use of abx ointment  if not healing - may need bx  adv not to pick at it   Problem # 2:  HOT FLASHES (ICD-627.2) Assessment: New suspect this is related to perimenopause- given hx in family disc wt loss/ ways  to keep cool still on continuous oc-refilled that and she will f/u with gyn  Problem # 3:  VITAMIN D DEFICIENCY (ICD-268.9) Assessment: Unchanged lab today with suppl Orders: T- * Misc. Laboratory test [phone]) Specimen Handling ([address]295) Specimen Handling (28413)  Problem # 4:  VITAMIN B12 DEFICIENCY (ICD-266.2) Assessment: Unchanged pt continues shots at drugstore(cheaper) check level today in light of skin c/o Orders: Venipuncture ([address]) T-Renal Function Panel [id number]) T-Lipid Profile [id number]) T-Hepatic Function [phone]) T- Hemoglobin A1C ([id number]) T-Vitamin B12 (30160-10932) Specimen  Handling (99000)  Problem # 5:  HYPERLIPIDEMIA (ICD-272.4) Assessment: Unchanged  labs and disc diet  f/u 1-2 mo to disc  Her updated medication list for this problem includes:    Simvastatin 80 Mg Tabs (Simvastatin) ..... One by mouth once daily  Orders: Venipuncture [phone]) T-Renal Function Panel [phone]) T-Lipid Profile [id number]) T-Hepatic Function [id number]) T- Hemoglobin A1C [phone]) T-Vitamin B12 [phone]) Specimen Handling ([address])  Labs Reviewed: SGOT: 20 (03/07/2009)   SGPT: 13 (03/07/2009)   HDL:50.30 (03/07/2009), 42.30 (08/23/2008)  LDL:94 (08/23/2008), 68 (05/17/2008)  Chol:156 (03/07/2009), 167 (08/23/2008)  Trig:227.0 (03/07/2009), 155.0 (08/23/2008)  Problem # 6:  DIABETES MELLITUS (ICD-250.00) Assessment: Unchanged DM - check AIC rev [facility] diet in detail- feel this is a good option f/u 1-2 mo  keep working on wt loss  Orders: Venipuncture ([address]124) T-Renal Function Panel [phone]) T-Lipid Profile [id number]) T-Hepatic Function [id number]) T- Hemoglobin A1C ([id number]) T-Vitamin B12 (68341-96222) Specimen Handling (97989)  Complete Medication List: 1)  Spironolactone 50 Mg Tabs (Spironolactone) .... One by mouth once daily 2)  Xanax 1 Mg Tabs (Alprazolam) .... Two tablets at bedtime and as needed during the  day 3)  Yasmin 28 3-0.03 Mg Tabs (Drospirenone-ethinyl estradiol) .... Continuous dosing take by mouth as directed 4)  Simvastatin 80 Mg Tabs (Simvastatin) .... One by mouth once daily 5)  Prilosec 40 Mg Cpdr (Omeprazole) .Marland Kitchen.. 1 by mouth twice daily 6)  Trazodone Hcl 150 Mg Tabs (Trazodone hcl) .... 2-3 tablets by mouth at bedtime 7)  Lidoderm 5 % Ptch (Lidocaine) .... Apply once daily (12 hours on and 12 hours off) to aff areas as directed as needed pain 8)  Cobal-1000 1000 Mcg/ml Inj Soln (Cyanocobalamin) .Marland Kitchen.. 1 ml (1000 micrograms) im as directed every  month 9)  Tramadol Hcl 50 Mg Tabs (Tramadol hcl) .Marland Kitchen.. 1 every 6 hrs as needed pain, may combine with 1 tylenol 500mg   by mouth 10)  Tylenol Extra Strength 500 Mg Tabs (Acetaminophen) .... Otc as directed. 11)  Topicort 0.25 % Crea (Desoximetasone) .... Apply as directed 12)  Refenesen 400 400 Mg Tabs (Guaifenesin) .... Otc as directed. 13)  Prozac 40 Mg Caps (Fluoxetine hcl) .Marland Kitchen.. 1 by mouth daily 14)  Flexeril 5 Mg Tabs (Cyclobenzaprine hcl) .... 1/2 to 1 tab up to three times a day as needed for neck pain and spasm 15)  Tessalon Perles 100 Mg Caps (Benzonatate) .Marland Kitchen.. 1-2 by mouth up to three times a day as needed cough 16)  Vitamin D 1000 Iu  .Marland Kitchen.. 1 by mouth once daily  Patient Instructions: 1)  use triple antibiotic ointment on scalp spot until healed  2)  go foward with the [facility] diet and fitness plan  3)  I think you may be going through perimenopause- follow up with your gyn doctor when you can  4)  follow up with me in about a month  Prescriptions: YASMIN 28 3-0.03 MG  TABS (DROSPIRENONE-ETHINYL ESTRADIOL) continuous dosing take by mouth as directed  #1 pack x 5   Entered and Authorized by:   Judith Part MD   Signed by:   Judith Part MD on 06/17/2009   Method used:   Print then Give to Patient   RxID:   [id number] TRAMADOL HCL 50 MG TABS (TRAMADOL HCL) 1 every 6 hrs as needed pain, may combine with 1 tylenol 500mg   by  mouth  #90 x 1   Entered and Authorized by:   Judith Part MD   Signed by:  Judith Part MD on 06/17/2009   Method used:   Print then Give to Patient   RxID:   [id number]   Current Allergies (reviewed today): ! * TAPE ! ZOLOFT CODEINE * AMBIEN HALCION PREDNISONE * ORTHO EVRA HYDROCODONE * CYMBALTA KEFLEX * SEROQUEL * BAND AIDS * SONATA

## 2010-03-14 NOTE — Progress Notes (Signed)
Summary: Benzonatate 100mg  refill  Phone Note From Pharmacy Call back at [phone] Message from:  [facility] on March 04, 2009 3:46 PM  Caller: [facility]* Call For: Dr. Milinda Antis  Summary of Call: [facility] request refill for Benzonatate 100mg  caps take one to two caps every eight hours as needed for cough. Last filled 01/12/09.  I called pt to see if pt was having problem and pt said since endoscopy end of July 2010 pt has had a cough. Sometimes it is productive but mostly a tickle sensation in throat. Pt said had some cough prior to endoscopy but more cough since the procedure. Pt said she cannot take med with decongestant because it speeds up her heart. Initial call taken by: Lewanda Rife LPN,  March 04, 2009 3:50 PM  Follow-up for Phone Call        ok to fill tessalon- but f/u if not imp in 2 weeks px written on EMR for call in  Follow-up by: Judith Part MD,  March 04, 2009 5:22 PM  Additional Follow-up for Phone Call Additional follow up Details #1::        Called to [facility]. Additional Follow-up by: Lowella Petties CMA,  March 04, 2009 5:46 PM    New/Updated Medications: TESSALON PERLES 100 MG CAPS (BENZONATATE) 1-2 by mouth up to three times a day as needed cough Prescriptions: TESSALON PERLES 100 MG CAPS (BENZONATATE) 1-2 by mouth up to three times a day as needed cough  #60 x 0   Entered and Authorized by:   Judith Part MD   Signed by:   Lowella Petties CMA on 03/04/2009   Method used:   Telephoned to ...       [facility]* (retail)       [address]       [place], [place]  [address]       Ph: [phone]       Fax: [phone]   RxID:   [id number]

## 2010-03-14 NOTE — Letter (Signed)
Summary: Clearance Letter  Architectural technologist at [facility] [address]   [place], [place] [address]   Phone: [phone]  Fax: [phone]    February 14, 2009  Re:     [name] Address:   [address]  [address]     [place], [place]  [address] DOB:     Dec 27, 1965 MRN:     [id number]   Dear Dr. Gertie Baron-  [name] has been cleared from a cardiology standpoint to proceed with her facial surgery.  Should you need any additional information or assistance, please feel free to contact our office at [phone].       Sincerely,  Marca Ancona, MD Charlena Cross, RN, BSN

## 2010-03-14 NOTE — Assessment & Plan Note (Signed)
Summary: ?RASH ON THIGHS/CLE   Vital Signs:  Patient profile:   45 year old female Height:      64 inches Weight:      350.25 pounds BMI:     60.34 Temp:     98.8 degrees F oral Pulse rate:   108 / minute Pulse rhythm:   regular BP sitting:   124 / 90  (left arm) Cuff size:   large  Vitals Entered By: Lewanda Rife LPN (September 21, 2009 4:28 PM) CC: rash on thighs and groin   History of Present Illness: rash started on thighs -- on saturday  thinks it is heat rash  used cool wash cloth  better if she can air it out  burns and itches at night   has lost 30 lb eating greek yogurt and healthy diet   Allergies: 1)  ! * Tape 2)  ! Zoloft 3)  Codeine 4)  * Ambien 5)  Halcion 6)  Prednisone 7)  * Ortho Evra 8)  Hydrocodone 9)  * Cymbalta 10)  Keflex 11)  * Seroquel 12)  * Band Aids 13)  * Sonata  Past History:  Past Medical History: Last updated: 01/22/2009 1. Allergic rhinitis 2. Anxiety 3. Depression 4. GERD 5. Hyperlipidemia 6. Osteoarthritis 7. morbid obesity 8. Lymphedema 9. Tachycardia 10. Fibromyalgia  psychiatry GI- [facility]  Past Surgical History: Last updated: 09/08/2008 Tonsillectomy Septoplasty Foot surgery EGD- neg (06/2001) Uterine tumor (09/2001) EGD- normal (09/2001) MRI C-spine-  normal (04/2003) Vagus nerve stimulator implanted Endometrial biopsy (01/2004) Uterine fibroid (06/2005)- uterine ablation Abd [place] - normal (04/2006) abd [place] - fatty liver, slt dilated cbd (no stones) 6/10 7/10 colonoscopy- polyp 7/10 EGD - erythematous mucosa, polyp  Family History: Last updated: Jan 22, 2009 Father: HTN, deceased- pancreatic cancer Mother:  Siblings: sister with HTN Grandmother with MI in her 56s, other grandmother with MI in her 48s.   Social History: Last updated: 01-22-09 Marital Status: single Children: none Occupation: disabled Nonsmoker No drugs 1 caffeinated beverage daily No ETOH  Risk Factors: Alcohol Use: <1  (Jan 22, 2009) Caffeine Use: 3 cups  (2009-01-22) Exercise: no (01/22/09)  Risk Factors: Smoking Status: never (January 22, 2009)  Review of Systems General:  Complains of fatigue; denies fever, loss of appetite, and malaise. Eyes:  Denies blurring and eye irritation. CV:  Denies chest pain or discomfort and palpitations. Resp:  Denies cough, shortness of breath, and wheezing. GI:  Denies abdominal pain and change in bowel habits. GU:  Denies discharge and dysuria. Derm:  Complains of itching and rash. Heme:  Denies abnormal bruising and bleeding.  Physical Exam  General:  morbidly obese but well appearing Head:  normocephalic, atraumatic, and no abnormalities observed.   Mouth:  pharynx pink and moist.   Neck:  supple with full rom and no masses or thyromegally, no JVD or carotid bruit  Heart:  normal rate, regular rhythm, and no murmur.   Abdomen:  soft and non-tender.   Skin:  erythematous rash with satellite lesions in creases under pannus  no open skin or drainage generally sweaty/ wet area  Cervical Nodes:  No lymphadenopathy noted Inguinal Nodes:  No significant adenopathy Psych:  nl affect today- less depressed   Impression & Recommendations:  Problem # 1:  INTERTRIGO, CANDIDAL (ICD-695.89) Assessment New  will tx with nystatin powder under pannus in thigh creases disc keeping the area as clean and dry as possible -- see inst update if not imp next week  Orders: Prescription Created Electronically [phone])  Complete  Medication List: 1)  Spironolactone 50 Mg Tabs (Spironolactone) .... One by mouth once daily 2)  Xanax 1 Mg Tabs (Alprazolam) .... Two tablets at bedtime and as needed during the day 3)  Yasmin 28 3-0.03 Mg Tabs (Drospirenone-ethinyl estradiol) .... Continuous dosing take by mouth as directed 4)  Simvastatin 80 Mg Tabs (Simvastatin) .... One by mouth once daily 5)  Prilosec 40 Mg Cpdr (Omeprazole) .Marland Kitchen.. 1 by mouth twice daily 6)  Trazodone Hcl 150 Mg  Tabs (Trazodone hcl) .... 2-3 tablets by mouth at bedtime 7)  Lidoderm 5 % Ptch (Lidocaine) .... Apply once daily (12 hours on and 12 hours off) to aff areas as directed as needed pain 8)  Cobal-1000 1000 Mcg/ml Inj Soln (Cyanocobalamin) .Marland Kitchen.. 1 ml (1000 micrograms) im as directed every  month 9)  Tramadol Hcl 50 Mg Tabs (Tramadol hcl) .Marland Kitchen.. 1 every 6 hrs as needed pain, may combine with 1 tylenol 500mg   by mouth 10)  Tylenol Extra Strength 500 Mg Tabs (Acetaminophen) .... Otc as directed. 11)  Topicort 0.25 % Crea (Desoximetasone) .... Apply as directed 12)  Refenesen 400 400 Mg Tabs (Guaifenesin) .... Otc as directed. 13)  Prozac 40 Mg Caps (Fluoxetine hcl) .Marland Kitchen.. 1 by mouth daily 14)  Flexeril 5 Mg Tabs (Cyclobenzaprine hcl) .... 1/2 to 1 tab up to three times a day as needed for neck pain and spasm 15)  Tessalon Perles 100 Mg Caps (Benzonatate) .Marland Kitchen.. 1-2 by mouth up to three times a day as needed cough 16)  Vitamin D 1000 Iu  .Marland Kitchen.. 1 by mouth once daily 17)  Clonidine Hcl 0.1 Mg Tabs (Clonidine hcl) .... Take 1 tablet by mouth once a day 18)  Nystop 100000 Unit/gm Powd (Nystatin) .... Apply to affected area two times a day until clear  Patient Instructions: 1)  after bathing  - dry affected area very well -- hairdryer works on [employer] setting you can get  2)  then use the nystatin powder two times a day until clear  3)  if not improved in 7-10 days - call  Prescriptions: NYSTOP 100000 UNIT/GM POWD (NYSTATIN) apply to affected area two times a day until clear  #1 medium x 1   Entered and Authorized by:   Judith Part MD   Signed by:   Judith Part MD on 09/21/2009   Method used:   Electronically to        [facility]* (retail)       [address]       [place], [place]  [address]       Ph: [phone]       Fax: [phone]   RxID:   [id number]   Current Allergies (reviewed today): ! * TAPE ! ZOLOFT CODEINE * AMBIEN HALCION PREDNISONE * ORTHO  EVRA HYDROCODONE * CYMBALTA KEFLEX * SEROQUEL * BAND AIDS * SONATA

## 2010-03-14 NOTE — Progress Notes (Signed)
Summary: Questions regarding labs   Phone Note Call from Patient   Summary of Call: Pt wants to cancell appt for Wed, June 8th to discuss labs. Pt says she review her labs on [facility], however, she has questions. Pt says she went to see her OBGYN on last week, she was given Clonidine 0.1 mg for the hot flashes.Marland KitchenMarland KitchenMarland KitchenDaine Gip  July 18, 2009 11:25 AM. Call back # [phone]  Initial call taken by: Daine Gip,  July 18, 2009 11:25 AM  Follow-up for Phone Call        Spoke with patient.  She says it sounds like everything is okay with her labs from [facility] and I did cancel the follow up appt. at the patient's request.  If you feel differently about this, please let me know and I will phone the patient to reschedule.  I added the Clonidine to her meds list as stated above from her OB/GYN. Follow-up by: Delilah Shan CMA Duncan Dull),  July 18, 2009 3:18 PM

## 2010-03-14 NOTE — Miscellaneous (Signed)
Summary: CONTROLLED SUBSTANCES CONTRACT  CONTROLLED SUBSTANCES CONTRACT   Imported By: Wilder Glade 09/28/2009 12:47:09  _____________________________________________________________________  External Attachment:    Type:   Image     Comment:   External Document

## 2010-03-14 NOTE — Progress Notes (Signed)
Summary: wants medicine for rash  Phone Note Call from Patient Call back at Home Phone [phone]   Caller: Patient Call For: Judith Part MD Summary of Call: Pt was seen for a rash, she says this is spreading and she is asking if she can have an oral medicine for this.  It's difficult for her to get the powder where it's supposed to go.  [facility]ses [facility]. Initial call taken by: Lowella Petties CMA,  October 20, 2009 11:15 AM  Follow-up for Phone Call        I'm going to have her take a diflucan and also try ketoconazole cream  px written on EMR for call in  update / follow up if not imp Follow-up by: Judith Part MD,  October 20, 2009 11:56 AM  Additional Follow-up for Phone Call Additional follow up Details #1::        script written for diflucan x 1. Additional Follow-up by: Eustaquio Boyden  MD,  October 20, 2009 1:11 PM    Additional Follow-up for Phone Call Additional follow up Details #2::    Patient notified as instructed by telephone. Medication sent electronically to [facility] as instructed.Lewanda Rife LPN  October 20, 2009 2:08 PM   New/Updated Medications: KETOCONAZOLE 2 % CREA (KETOCONAZOLE) apply to aff area once daily for 2 weeks FLUCONAZOLE 150 MG TABS (FLUCONAZOLE) one po x 1 Prescriptions: KETOCONAZOLE 2 % CREA (KETOCONAZOLE) apply to aff area once daily for 2 weeks  #1 medium x 0   Entered by:   Lewanda Rife LPN   Authorized by:   Judith Part MD   Signed by:   Lewanda Rife LPN on 91/47/8295   Method used:   Electronically to        [facility]* (retail)       [address]       [place], [place]  [address]       Ph: [phone]       Fax: [phone]   RxID:   [id number] FLUCONAZOLE 150 MG TABS (FLUCONAZOLE) one po x 1  #1 x 0   Entered by:   Lewanda Rife LPN   Authorized by:   Judith Part MD   Signed by:   Lewanda Rife LPN on 42/59/5638   Method used:   Electronically to        [facility]* (retail)       [address]       [place], [place]  [address]       Ph: [phone]       Fax: [phone]   RxID:   [id number] FLUCONAZOLE 150 MG TABS (FLUCONAZOLE) one po x 1  #1 x 0   Entered and Authorized by:   Eustaquio Boyden  MD   Signed by:   Eustaquio Boyden  MD on 10/20/2009   Method used:   Telephoned to ...       [facility]* (retail)       [address]       [place], [place]  [address]       Ph: [phone]       Fax: [phone]   RxID:   [id number] KETOCONAZOLE 2 % CREA (KETOCONAZOLE) apply to aff area once daily for 2 weeks  #1 medium x 0   Entered and Authorized  by:   Judith Part MD   Signed by:   Judith Part MD on 10/20/2009   Method used:   Telephoned to ...       [facility]* (retail)       [address]       [place], [place]  [address]       Ph: [phone]       Fax: [phone]   RxID:   [id number]

## 2010-03-14 NOTE — Progress Notes (Signed)
Summary: skin tag   Phone Note Call from Patient Call back at Home Phone [phone]   Caller: Patient Call For: Judith Part MD Summary of Call: Patient says that the place on her leg were her skin tag was removed is really sore. It feels like the skin is stretching when she stands up. She can't see the area were it was removed to see what it looks like and it is really hard for her to keep it clean and dry. She is asking what she should put on it to help with the pain.  Uses [facility] if needed.  Initial call taken by: Melody Comas,  November 04, 2009 3:09 PM  Follow-up for Phone Call        I will px bactroban -- a stronger px antibiotic ointment to put on it two times a day  if fever or other symptoms - let me know/ or if discharge  she may need to ask a friend or family member if possible to help her see it or clean area  needs re check , so if nothing availible today- she can choose monday or saturday clinic depending on her symptoms  px written on EMR for call in  Follow-up by: Judith Part MD,  November 04, 2009 3:31 PM  Additional Follow-up for Phone Call Additional follow up Details #1::        Patient notified as instructed by telephone. Pt made appt to see Dr Milinda Antis on Mon 11/07/09 at 3pm. Ptsaid she would prefer to see Dr Milinda Antis than any one else. Pt did take Sat clinic # to call if fever or it appears infected. Medication phoned to [facility] as instructed. Lewanda Rife LPN  November 04, 2009 4:36 PM     New/Updated Medications: BACTROBAN 2 % OINT (MUPIROCIN) apply to affected area two times a day Prescriptions: BACTROBAN 2 % OINT (MUPIROCIN) apply to affected area two times a day  #1 small x 0   Entered and Authorized by:   Judith Part MD   Signed by:   Lewanda Rife LPN on 09/81/1914   Method used:   Telephoned to ...       [facility]* (retail)       [address]       [place], [place]  [address]       Ph:  [phone]       Fax: [phone]   RxID:   [id number]

## 2010-03-14 NOTE — Miscellaneous (Signed)
  Medications Added CLONIDINE HCL 0.1 MG TABS (CLONIDINE HCL) Take 1 tablet by mouth once a day       Clinical Lists Changes  Medications: Added new medication of CLONIDINE HCL 0.1 MG TABS (CLONIDINE HCL) Take 1 tablet by mouth once a day

## 2010-03-14 NOTE — Progress Notes (Signed)
----   Converted from flag ---- ---- 03/07/2009 1:45 PM, Colon Flattery Tower MD wrote: go ahead and add it for vit D deficiency-- thanks  ---- 03/07/2009 9:55 AM, Liane Comber CMA (AAMA) wrote: Pt had labs today she wanted to add a vit d level, she says she stopped taking her vit d and now thinks it is low. Is this ok to add?  Thanks Tasha ------------------------------

## 2010-03-14 NOTE — Assessment & Plan Note (Signed)
Summary: F/U/CLE   Vital Signs:  Patient profile:   45 year old female Height:      64 inches Weight:      363.75 pounds BMI:     62.66 Temp:     98.3 degrees F oral Pulse rate:   72 / minute Pulse rhythm:   regular BP sitting:   118 / 84  (left arm) Cuff size:   large  Vitals Entered By: Lewanda Rife LPN (March 23, 2009 12:00 PM)  History of Present Illness: here for f/u of chronic health probs  has been doing about the same  did not end up having her surgery -- did change her plan  financially was too overwhelming   heart rate is better today   anx and dep -- coming out of a cycle   wt is up 3 lb today   lipids fair but trig up at 227  good LDL at 84 sugar and fat intake are about the same - appetite goes up and down  not motivated to change   still trouble with vertigo in am and pm   AIC stable at 6.3 diet - has not really changed  exercise -- doing stuff around the house  injured back - going to the chiropractor and will go to massage  fibro is bad in the winter   chronic cough and congestion at night -- that is about the same- a lot of post nasal drip  no allergy meds work so far  thinks her reflux is relatively well controlled    B12 is 293- down significantly  was getting shots every 6 weeks / and last shot over 2 mo ago    vit D is up to 41 -- up from 20s   not taking any   needs flexeril refilled for her fibromyalgia   had toenail removed - is doing some better   Allergies: 1)  ! * Tape 2)  Codeine 3)  * Ambien 4)  Halcion 5)  Prednisone 6)  * Ortho Evra 7)  Hydrocodone 8)  * Cymbalta 9)  Keflex 10)  * Seroquel 11)  * Band Aids 12)  * Sonata  Past History:  Past Medical History: Last updated: 2009/01/26 1. Allergic rhinitis 2. Anxiety 3. Depression 4. GERD 5. Hyperlipidemia 6. Osteoarthritis 7. morbid obesity 8. Lymphedema 9. Tachycardia 10. Fibromyalgia  psychiatry GI- [facility]  Past Surgical History: Last updated:  09/08/2008 Tonsillectomy Septoplasty Foot surgery EGD- neg (06/2001) Uterine tumor (09/2001) EGD- normal (09/2001) MRI C-spine-  normal (04/2003) Vagus nerve stimulator implanted Endometrial biopsy (01/2004) Uterine fibroid (06/2005)- uterine ablation Abd [place] - normal (04/2006) abd [place] - fatty liver, slt dilated cbd (no stones) 6/10 7/10 colonoscopy- polyp 7/10 EGD - erythematous mucosa, polyp  Family History: Last updated: January 26, 2009 Father: HTN, deceased- pancreatic cancer Mother:  Siblings: sister with HTN Grandmother with MI in her 40s, other grandmother with MI in her 39s.   Social History: Last updated: 01-26-09 Marital Status: single Children: none Occupation: disabled Nonsmoker No drugs 1 caffeinated beverage daily No ETOH  Risk Factors: Alcohol Use: <1 (01/26/09) Caffeine Use: 3 cups  (2009/01/26) Exercise: no (2009-01-26)  Risk Factors: Smoking Status: never (01-26-2009)  Review of Systems General:  Complains of fatigue; denies chills, fever, loss of appetite, and malaise. Eyes:  Denies blurring and eye irritation. ENT:  Complains of nasal congestion and postnasal drainage; denies sinus pressure and sore throat. CV:  Denies chest pain or discomfort, palpitations, and shortness of  breath with exertion. Resp:  Denies cough and wheezing. GI:  Complains of gas; denies abdominal pain, indigestion, nausea, and vomiting. GU:  Denies dysuria and hematuria. MS:  Complains of joint pain and stiffness; denies muscle weakness. Derm:  Denies itching, lesion(s), poor wound healing, and rash. Neuro:  Denies numbness and tingling. Psych:  Complains of anxiety and depression. Endo:  Denies cold intolerance, excessive thirst, excessive urination, and heat intolerance. Heme:  Denies abnormal bruising and bleeding.  Physical Exam  General:  morbidly obese but well appearing Head:  normocephalic, atraumatic, and no abnormalities observed.   Eyes:  vision grossly  intact, pupils equal, pupils round, and pupils reactive to light.  no conjunctival pallor, injection or icterus  Ears:  R ear normal and L ear normal.   Nose:  nares are boggy but clear  Mouth:  pharynx pink and moist, no erythema, and no exudates.   Neck:  supple with full rom and no masses or thyromegally, no JVD or carotid bruit  Chest Wall:  No deformities, masses, or tenderness noted. Lungs:  Normal respiratory effort, chest expands symmetrically. Lungs are clear to auscultation, no crackles or wheezes. Heart:  normal rate, regular rhythm, and no murmur.   Abdomen:  soft, non-tender, and normal bowel sounds.   Msk:  No deformity or scoliosis noted of thoracic or lumbar spine.   Pulses:  R and L carotid,radial,femoral,dorsalis pedis and posterior tibial pulses are full and equal bilaterally Extremities:  No clubbing, cyanosis, edema, or deformity noted with normal full range of motion of all joints.   Neurologic:  sensation intact to light touch, gait normal, and DTRs symmetrical and normal.   Skin:  Intact without suspicious lesions or rashes Cervical Nodes:  No lymphadenopathy noted Psych:  depressed but less than last visit  good eye contact and communication skills  Diabetes Management Exam:    Foot Exam (with socks and/or shoes not present):       Sensory-Pinprick/Light touch:          Left medial foot (L-4): normal          Left dorsal foot (L-5): normal          Left lateral foot (S-1): normal          Right medial foot (L-4): normal          Right dorsal foot (L-5): normal          Right lateral foot (S-1): normal       Sensory-Monofilament:          Left foot: normal          Right foot: normal       Sensory-other: R large toe - wrapped after toenail removal       Inspection:          Left foot: normal          Right foot: normal       Nails:          Left foot: normal          Right foot: normal   Impression & Recommendations:  Problem # 1:  DIABETES MELLITUS  (ICD-250.00) Assessment Unchanged  stable with AIC of 6.3 currently disc healthy diet (low simple sugar/ choose complex carbs/ low sat fat) diet and exercise in detail  suggested wt watchers for wt loss lab and f/u in 6 mo   Labs Reviewed: Creat: 1.0 (03/07/2009)    Reviewed HgBA1c results: 6.3 (03/07/2009)  6.4 (  08/23/2008)  Problem # 2:  VITAMIN D DEFICIENCY (ICD-268.9) Assessment: Improved level ok now  asked to take 1000 international units D3 daily  Problem # 3:  VITAMIN B12 DEFICIENCY (ICD-266.2) Assessment: Deteriorated  step up shots to monthly due to dec levels  also continue otc oral suppl once daily   Orders: Admin of Therapeutic Inj  intramuscular or subcutaneous (13086) Vit B12 1000 mcg (J3420)  Problem # 4:  HYPERLIPIDEMIA (ICD-272.4) Assessment: Deteriorated  overall well controlled except for trig  this may due to inc sugar intake disc healthy diet (low simple sugar/ choose complex carbs/ low sat fat) diet and exercise in detail  re check 6 mo and f/u Her updated medication list for this problem includes:    Simvastatin 80 Mg Tabs (Simvastatin) ..... One by mouth once daily  Labs Reviewed: SGOT: 20 (03/07/2009)   SGPT: 13 (03/07/2009)   HDL:50.30 (03/07/2009), 42.30 (08/23/2008)  LDL:94 (08/23/2008), 68 (05/17/2008)  Chol:156 (03/07/2009), 167 (08/23/2008)  Trig:227.0 (03/07/2009), 155.0 (08/23/2008)  Complete Medication List: 1)  Spironolactone 50 Mg Tabs (Spironolactone) .... One by mouth once daily 2)  Xanax 1 Mg Tabs (Alprazolam) .... Two tablets at bedtime and as needed during the day 3)  Yasmin 28 3-0.03 Mg Tabs (Drospirenone-ethinyl estradiol) .... Continuous dosing take by mouth as directed 4)  Simvastatin 80 Mg Tabs (Simvastatin) .... One by mouth once daily 5)  Prilosec 40 Mg Cpdr (Omeprazole) .Marland Kitchen.. 1 by mouth twice daily 6)  Trazodone Hcl 150 Mg Tabs (Trazodone hcl) .... 2-3 tablets by mouth at bedtime 7)  Lidoderm 5 % Ptch (Lidocaine) ....  Apply once daily (12 hours on and 12 hours off) to aff areas as directed as needed pain 8)  Cobal-1000 1000 Mcg/ml Inj Soln (Cyanocobalamin) .Marland Kitchen.. 1 ml (1000 micrograms) im as directed every  month 9)  Tramadol Hcl 50 Mg Tabs (Tramadol hcl) .Marland Kitchen.. 1 every 6 hrs as needed pain, may combine with 1 tylenol 500mg   by mouth 10)  Tylenol Extra Strength 500 Mg Tabs (Acetaminophen) .... Otc as directed. 11)  Topicort 0.25 % Crea (Desoximetasone) .... Apply as directed 12)  Refenesen 400 400 Mg Tabs (Guaifenesin) .... Otc as directed. 13)  Prozac 40 Mg Caps (Fluoxetine hcl) .Marland Kitchen.. 1 by mouth daily 14)  Flexeril 5 Mg Tabs (Cyclobenzaprine hcl) .... 1/2 to 1 tab up to three times a day as needed for neck pain and spasm 15)  Vitamin D 2000 Unit Tabs (Cholecalciferol) .Marland Kitchen.. 1 by mouth daily 16)  Tessalon Perles 100 Mg Caps (Benzonatate) .Marland Kitchen.. 1-2 by mouth up to three times a day as needed cough 17)  Neosporin Original [phone] Oint (Neomycin-bacitracin-polymyxin) .... Otc as directed. 18)  Vitamin D 1000 Iu  .Marland Kitchen.. 1 by mouth once daily  Other Orders: Pneumococcal Vaccine (57846) Admin 1st Vaccine ([address]) Admin 1st Vaccine [facility]) [phone])  Patient Instructions: 1)  take vitamin D (D3 ) - 1000 international units per day 2)  increase your B12 shots to once per month- schedule next one in 1 mo 3)  B12 shot today  4)  pneumonia vaccine today 5)  keep working on weight loss diet and exercise  6)  think about weight watchers program  7)  schedule fasting lab and then follow up in 6 months lipid/ast/alt/ renal / AIC 272 / 250.0 Prescriptions: FLEXERIL 5 MG TABS (CYCLOBENZAPRINE HCL) 1/2 to 1 tab up to three times a day as needed for neck pain and spasm  #30 x 3   Entered and Authorized  by:   Judith Part MD   Signed by:   Judith Part MD on 03/23/2009   Method used:   Print then Give to Patient   RxID:   [id number]   Current Allergies (reviewed today): ! * TAPE CODEINE *  AMBIEN HALCION PREDNISONE * ORTHO EVRA HYDROCODONE * CYMBALTA KEFLEX * SEROQUEL * BAND AIDS * SONATA   Pneumovax Vaccine    Vaccine Type: Pneumovax    Site: right deltoid    Mfr: Merck    Dose: 0.5 ml    Route: IM    Given by: Lewanda Rife LPN    Exp. Date: 06/02/2010    Lot #: [id number]    VIS given: 09/10/95 version given March 23, 2009.    Medication Administration  Injection # 1:    Medication: Vit B12 1000 mcg    Diagnosis: VITAMIN B12 DEFICIENCY (ICD-266.2)    Route: IM    Site: L deltoid    Exp Date: 11/13/2010    Lot #: [id number]    Mfr: [employer]    Patient tolerated injection without complications    Given by: Lewanda Rife LPN (March 23, 2009 2:16 PM)  Orders Added: 1)  Pneumococcal Vaccine [90732] 2)  Admin 1st Vaccine [90471] 3)  Admin 1st Vaccine [facility]) [phone] 4)  Admin of Therapeutic Inj  intramuscular or subcutaneous [96372] 5)  Vit B12 1000 mcg [J3420] 6)  Est. Patient Level IV [74259]       Medication Administration  Injection # 1:    Medication: Vit B12 1000 mcg    Diagnosis: VITAMIN B12 DEFICIENCY (ICD-266.2)    Route: IM    Site: L deltoid    Exp Date: 11/13/2010    Lot #: [id number]    Mfr: [employer]    Patient tolerated injection without complications    Given by: Lewanda Rife LPN (March 23, 2009 2:16 PM)  Orders Added: 1)  Pneumococcal Vaccine [90732] 2)  Admin 1st Vaccine [90471] 3)  Admin 1st Vaccine [facility]) [75643P] 4)  Admin of Therapeutic Inj  intramuscular or subcutaneous [96372] 5)  Vit B12 1000 mcg [J3420] 6)  Est. Patient Level IV [29518]

## 2010-03-14 NOTE — Progress Notes (Signed)
Summary: several problems  Phone Note Call from Patient Call back at Home Phone [phone]   Caller: Patient Call For: Judith Part MD Summary of Call: Pt has a knot on her scalp for about 2 weeks that she thinks might be a tick bite.  Since finding that she has had unusual sxs.  Feels like she has a suburn, but doesnt- skin burns.  Muscle aches- which could be her fibromyalgia.  I just dont know where to  put her for an appt.  Please look at your schedule and let me know.  Will she need extra time? Initial call taken by: Lowella Petties CMA,  Jun 14, 2009 4:17 PM  Follow-up for Phone Call        please put her in on friday for 3:30 appt and give her 30 minutes  thanks  Follow-up by: Judith Part MD,  Jun 14, 2009 5:00 PM  Additional Follow-up for Phone Call Additional follow up Details #1::        Appt made. Additional Follow-up by: Lowella Petties CMA,  Jun 14, 2009 5:09 PM

## 2010-03-14 NOTE — Assessment & Plan Note (Signed)
Summary: ? ABSCESS UPPER LEFT THIGH   Vital Signs:  Patient profile:   45 year old female Height:      64 inches Weight:      381 pounds BMI:     65.63 Temp:     97.6 degrees F oral Pulse rate:   100 / minute Pulse rhythm:   regular BP sitting:   120 / 90  (left arm) Cuff size:   large  Vitals Entered By: Lewanda Rife LPN (October 28, 2009 12:37 PM) CC: ? abscess upper leg near groin on left   History of Present Illness: wt is up on out scale -- ? does not line up on her scale   feels a little bloated - hard to tell  clothes feel looser   L sided lesion-- ? abcess in crease of leg on L  cannot see it  is hurting  cannot tell if draining   the yeast is better with the oral pill       Allergies: 1)  ! * Tape 2)  ! Zoloft 3)  Codeine 4)  * Ambien 5)  Halcion 6)  Prednisone 7)  * Ortho Evra 8)  Hydrocodone 9)  * Cymbalta 10)  Keflex 11)  * Seroquel 12)  * Band Aids 13)  * Sonata  Past History:  Past Medical History: Last updated: 30-Jan-2009 1. Allergic rhinitis 2. Anxiety 3. Depression 4. GERD 5. Hyperlipidemia 6. Osteoarthritis 7. morbid obesity 8. Lymphedema 9. Tachycardia 10. Fibromyalgia  psychiatry GI- [facility]  Past Surgical History: Last updated: 09/08/2008 Tonsillectomy Septoplasty Foot surgery EGD- neg (06/2001) Uterine tumor (09/2001) EGD- normal (09/2001) MRI C-spine-  normal (04/2003) Vagus nerve stimulator implanted Endometrial biopsy (01/2004) Uterine fibroid (06/2005)- uterine ablation Abd [place] - normal (04/2006) abd [place] - fatty liver, slt dilated cbd (no stones) 6/10 7/10 colonoscopy- polyp 7/10 EGD - erythematous mucosa, polyp  Family History: Last updated: 01/30/09 Father: HTN, deceased- pancreatic cancer Mother:  Siblings: sister with HTN Grandmother with MI in her 32s, other grandmother with MI in her 89s.   Social History: Last updated: 2009/01/30 Marital Status: single Children: none Occupation:  disabled Nonsmoker No drugs 1 caffeinated beverage daily No ETOH  Risk Factors: Alcohol Use: <1 (01/30/09) Caffeine Use: 3 cups  (01-30-2009) Exercise: no (Jan 30, 2009)  Risk Factors: Smoking Status: never (01/30/2009)  Review of Systems General:  Complains of fatigue; denies fever, loss of appetite, and malaise. Eyes:  Denies blurring. CV:  Denies chest pain or discomfort and palpitations. Resp:  Denies cough. GI:  Denies indigestion, nausea, and vomiting. GU:  Denies dysuria and urinary frequency. MS:  Denies joint swelling and cramps. Derm:  Complains of lesion(s); denies itching, poor wound healing, and rash. Neuro:  Denies numbness and tingling. Psych:  Complains of anxiety and depression. Endo:  Complains of heat intolerance; denies cold intolerance, excessive thirst, and excessive urination. Heme:  Denies abnormal bruising and bleeding.  Physical Exam  General:  morbidly obese but well appearing Head:  normocephalic, atraumatic, and no abnormalities observed.   Eyes:  vision grossly intact, pupils equal, pupils round, and pupils reactive to light.   Mouth:  pharynx pink and moist.   Neck:  supple with full rom and no masses or thyromegally, no JVD or carotid bruit  Lungs:  Normal respiratory effort, chest expands symmetrically. Lungs are clear to auscultation, no crackles or wheezes. Heart:  normal rate, regular rhythm, and no murmur.   Abdomen:  obese with large pannus soft and nt  nl bs  Extremities:  No clubbing, cyanosis, edema, or deformity noted with normal full range of motion of all joints.   Neurologic:  sensation intact to light touch, gait normal, and DTRs symmetrical and normal.   Skin:  3 mm and 1 mm skin tags in L hip crease with irritation/ partial autoamputation - tan to black in color cleaned sterilly anesth with topical spray clipped with scissors hemostasis with pressure and silver nitrate stick  pt tol well dressed with abx oint and  bandaid  intertrigo is much improved  Cervical Nodes:  No lymphadenopathy noted Inguinal Nodes:  No significant adenopathy Psych:  tearful today when disc wt problems  Diabetes Management Exam:    Foot Exam (with socks and/or shoes not present):       Sensory-Pinprick/Light touch:          Left medial foot (L-4): normal          Left dorsal foot (L-5): normal          Left lateral foot (S-1): normal          Right medial foot (L-4): normal          Right dorsal foot (L-5): normal          Right lateral foot (S-1): normal       Sensory-Monofilament:          Left foot: normal          Right foot: normal       Sensory-other: generally dry skin on feet       Inspection:          Left foot: normal          Right foot: normal       Nails:          Left foot: normal          Right foot: normal   Impression & Recommendations:  Problem # 1:  SKIN TAG (ICD-701.9) Assessment New  in crease of thigh that is irritated and partially auto amputated without signs of infection  removed today successfully aftercare discussed  pt advised to update me if symptoms worsen or do not improve   Orders: Removal of Skin Tags up to 15 Lesions (11200)  Problem # 2:  INTERTRIGO, CANDIDAL (ICD-695.89) Assessment: Improved this is improved with combination of antifungal cream/ powder and diflucan rev need to keep dry I recommended using powder regularly  Problem # 3:  OBESITY, MORBID (ICD-278.01) Assessment: Deteriorated pt upset by the scale today , but I believe that may not be accurate I adv her to stick to one scale in one place - her own and update me  disc healthy diet (low simple sugar/ choose complex carbs/ low sat fat) diet and exercise in detail   Complete Medication List: 1)  Spironolactone 50 Mg Tabs (Spironolactone) .... One by mouth once daily 2)  Xanax 1 Mg Tabs (Alprazolam) .... Two tablets at bedtime and as needed during the day 3)  Yasmin 28 3-0.03 Mg Tabs  (Drospirenone-ethinyl estradiol) .... Continuous dosing take by mouth as directed 4)  Simvastatin 80 Mg Tabs (Simvastatin) .... One by mouth once daily 5)  Prilosec 40 Mg Cpdr (Omeprazole) .Marland Kitchen.. 1 by mouth twice daily 6)  Trazodone Hcl 150 Mg Tabs (Trazodone hcl) .... 2-3 tablets by mouth at bedtime 7)  Lidoderm 5 % Ptch (Lidocaine) .... Apply once daily (12 hours on and 12 hours off) to aff areas as directed as  needed pain 8)  Cobal-1000 1000 Mcg/ml Inj Soln (Cyanocobalamin) .Marland Kitchen.. 1 ml (1000 micrograms) im as directed every  month 9)  Tramadol Hcl 50 Mg Tabs (Tramadol hcl) .Marland Kitchen.. 1 every 6 hrs as needed pain, may combine with 1 tylenol 500mg   by mouth 10)  Tylenol Extra Strength 500 Mg Tabs (Acetaminophen) .... Otc as directed. 11)  Topicort 0.25 % Crea (Desoximetasone) .... Apply as directed 12)  Refenesen 400 400 Mg Tabs (Guaifenesin) .... Otc as directed. 13)  Prozac 40 Mg Caps (Fluoxetine hcl) .Marland Kitchen.. 1 by mouth daily 14)  Flexeril 5 Mg Tabs (Cyclobenzaprine hcl) .... 1/2 to 1 tab up to three times a day as needed for neck pain and spasm 15)  Tessalon Perles 100 Mg Caps (Benzonatate) .Marland Kitchen.. 1-2 by mouth up to three times a day as needed cough 16)  Vitamin D 1000 Iu  .Marland Kitchen.. 1 by mouth once daily 17)  Clonidine Hcl 0.1 Mg Tabs (Clonidine hcl) .... Take 1 tablet by mouth twice a day 18)  Nystop 100000 Unit/gm Powd (Nystatin) .... Apply to affected area two times a day until clear as needed 19)  Ketoconazole 2 % Crea (Ketoconazole) .... Apply to aff area once daily for 2 weeks  Patient Instructions: 1)  we removed a skin tag today  2)  keep watching diet and work on weight loss  3)  keep area clean and dry - antibiotic ointment is ok 4)  if redness or pain let me know   Current Allergies (reviewed today): ! * TAPE ! ZOLOFT CODEINE * AMBIEN HALCION PREDNISONE * ORTHO EVRA HYDROCODONE * CYMBALTA KEFLEX * SEROQUEL * BAND AIDS * SONATA

## 2010-05-16 ENCOUNTER — Telehealth: Payer: Self-pay | Admitting: *Deleted

## 2010-05-16 NOTE — Telephone Encounter (Signed)
Patient had surgery recently and called to schedule appointment for Dr. Milinda Antis to look at her wound. Per Dr. Milinda Antis patient needs to go back to surgeon to have wound care. Patient was advised.

## 2010-06-08 ENCOUNTER — Other Ambulatory Visit: Payer: Self-pay | Admitting: Family Medicine

## 2010-06-09 NOTE — Telephone Encounter (Signed)
Medication phoned to [facility] pharmacy as instructed.

## 2010-06-09 NOTE — Telephone Encounter (Signed)
Px written for call in   

## 2010-06-10 ENCOUNTER — Encounter: Payer: Self-pay | Admitting: Family Medicine

## 2010-06-16 ENCOUNTER — Telehealth: Payer: Self-pay | Admitting: *Deleted

## 2010-06-16 ENCOUNTER — Ambulatory Visit: Payer: Self-pay | Admitting: Family Medicine

## 2010-06-16 NOTE — Telephone Encounter (Signed)
Pt is asking if she needs labs, she says she usually gets labs every 6 months.

## 2010-06-16 NOTE — Telephone Encounter (Signed)
Go ahead and sched fasting labs for lipids and a1c and B12 level and D level  For 250.0 and 401.1 and vit B12 def and vit D def and 272 Then f/u  Thanks

## 2010-06-16 NOTE — Telephone Encounter (Signed)
Fasting Lab appt scheduled as instructed 06/23/10 at 10:00am.

## 2010-06-20 ENCOUNTER — Other Ambulatory Visit: Payer: Self-pay | Admitting: Family Medicine

## 2010-06-20 DIAGNOSIS — E559 Vitamin D deficiency, unspecified: Secondary | ICD-10-CM

## 2010-06-20 DIAGNOSIS — E782 Mixed hyperlipidemia: Secondary | ICD-10-CM

## 2010-06-20 DIAGNOSIS — E538 Deficiency of other specified B group vitamins: Secondary | ICD-10-CM

## 2010-06-20 DIAGNOSIS — I10 Essential (primary) hypertension: Secondary | ICD-10-CM

## 2010-06-23 ENCOUNTER — Other Ambulatory Visit (INDEPENDENT_AMBULATORY_CARE_PROVIDER_SITE_OTHER): Payer: Medicare Other | Admitting: Family Medicine

## 2010-06-23 DIAGNOSIS — E559 Vitamin D deficiency, unspecified: Secondary | ICD-10-CM

## 2010-06-23 DIAGNOSIS — E782 Mixed hyperlipidemia: Secondary | ICD-10-CM

## 2010-06-23 DIAGNOSIS — E538 Deficiency of other specified B group vitamins: Secondary | ICD-10-CM

## 2010-06-23 DIAGNOSIS — E119 Type 2 diabetes mellitus without complications: Secondary | ICD-10-CM

## 2010-06-23 DIAGNOSIS — Z13220 Encounter for screening for lipoid disorders: Secondary | ICD-10-CM

## 2010-06-23 DIAGNOSIS — I10 Essential (primary) hypertension: Secondary | ICD-10-CM

## 2010-06-23 LAB — LIPID PANEL
HDL: 49.1 mg/dL (ref >39.00)
VLDL: 61.2 mg/dL — ABNORMAL HIGH (ref 0.0–40.0)

## 2010-06-23 LAB — VITAMIN B12: Vitamin B-12: 415 pg/mL (ref 211–911)

## 2010-06-23 LAB — LDL CHOLESTEROL, DIRECT: Direct LDL: 139.1 mg/dL

## 2010-06-23 LAB — HEMOGLOBIN A1C: Hgb A1c MFr Bld: 7.4 % — ABNORMAL HIGH (ref 4.6–6.5)

## 2010-06-24 LAB — VITAMIN D 25 HYDROXY (VIT D DEFICIENCY, FRACTURES): Vit D, 25-Hydroxy: 38 ng/mL (ref 30–89)

## 2010-06-27 NOTE — Assessment & Plan Note (Signed)
[facility]                             PULMONARY OFFICE NOTE   NAME:Mackenzie Bonilla, Mackenzie Bonilla                  MRN:          [id number]  DATE:10/16/2006                            DOB:          07-20-1965    HISTORY OF PRESENT ILLNESS:  The patient is a 45 year old female who I  have been asked to see for evaluation of obstructive sleep apnea.  The  patient had a recent nocturnal polysomnogram which showed a respiratory  disturbance index of three events per hour and O2 desaturation as low as  85%.  Even though the patient did not meet the AHI criteria for  obstructive sleep apnea syndrome, there was a question raised of whether  she may have the upper airway resistant syndrome.  The patient states  that she typically goes to bed between 10 and 11 and gets up between 8  and 10 to start her day.  She is not rested upon arising.  Unfortunately, there is no one around to see if she snores or has pauses  in her breathing during sleep.  She denies any choking arousals.  She  does note a lot of chronic pain at night which significantly disrupts  her sleep.  The patient thinks that her depression may be causing her to  feel poorly in the mornings.  The patient states that she will often  take naps in the afternoon.  She denies sleepiness with short driving,  but does admit to some degree of sleep pressure with long distances.  Overall, the patient's weight has been neutral, although, it has been up  and down over the last two years.   PAST MEDICAL HISTORY:  1. Significant for dyslipidemia.  2. Allergic rhinitis.  3. History of sinus surgery two years ago.  4. History of fibromyalgia and chronic fatigue syndrome.  5. History of IBS.  6. History of polycystic ovary disease.  7. History of depression and anxiety.  8. History of diverticulitis.  9. History of lymphedema.   CURRENT MEDICATIONS:  1. Fluoxetine 80 mg daily.  2. Flunisolide nasal spray daily.  3. Spironolactone 50 mg daily.  4. Wellbutrin XL 300 mg daily.  5. Loratadine 10 mg daily.  6. Trazodone 300 to 450 mg daily.  7. Xanax 2 mg daily.  8. Simvastatin 80 mg daily.  9. Nexium 40 mg daily.  10.Tramadol 50 mg daily.  11.Hyoscyamine daily.  12.Lidoderm patch 5% daily.   ALLERGIES:  No known drug allergies.   SOCIAL HISTORY:  She is single.  She does not have children.  She has  never smoked.   FAMILY HISTORY:  Remarkable for her mother having asthma and allergies.  Her father had pancreatic cancer.   REVIEW OF SYSTEMS:  As per history of present illness.  See patient  intake form documented in the chart.   PHYSICAL EXAMINATION:  GENERAL:  She is a morbidly obese female in no  acute distress.  VITAL SIGNS:  Blood pressure 128/88, pulse 103, temperature 98.8, weight  330 pounds.  She is 5 feet 4 inches tall.  O2 saturation on room air is  96%.  HEENT:  Pupils equal, round, and reactive to light and accommodation.  Extraocular muscles are intact.  Nares show mild septal deviation to the  left, but patent.  Oropharynx shows fairly normal uvula and palate.  NECK:  Large and difficult to assess for JVD.  There is no obvious  thyromegaly or lymphadenopathy.  CHEST:  Totally clear.  HEART:  Regular rate and rhythm.  ABDOMEN:  Soft and nontender with good bowel sounds.  GENITOURINARY:  RECTAL:  BREASTS:  Not done and not indicated.  EXTREMITIES:  Lower extremities show 1+ edema.  Pulses are intact  distally.  NEUROLOGY:  Alert and oriented with no obvious motor deficits.   IMPRESSION:  1. 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  The patient does not      have significant sleep apnea, but does have snoring by her tests      and a lot of sleep disruption.  Certainly some of this could be due      to her chronic pain syndrome.  I also think that a lot of her      daytime sleepiness and decreased alertness could be from her      medications and also from her psychiatric  disease.  The patient      really needs to work aggressively on weight loss.  2. O2 desaturation that is more than likely secondary to hypoaeration      from her morbid obesity while sleeping.  The patient does not      qualify for CPAP but may benefit from oxygen q.h.s. until she is      able to lose weight.   PLAN:  1. Work on weight loss.  2. We will do overnight oximetry on room air.  3. The patient will follow up on a p.r.n. basis.  I will call and let      her know if she does need oxygen.     Barbaraann Share, MD,FCCP  Electronically Signed    [facility]  DD: 11/04/2006  DT: 11/04/2006  Job #: [id number]   cc:   Gertie Baron, M.D. [facility]  Marne A. Milinda Antis, MD

## 2010-06-29 ENCOUNTER — Telehealth: Payer: Self-pay

## 2010-06-29 NOTE — Telephone Encounter (Signed)
Message copied by Lewanda Rife on Thu Jun 29, 2010  4:56 PM ------      Message from: Roxy Manns      Created: Sun Jun 25, 2010  6:30 PM       Sugar control is worse - please sched f/u when able -- 30 min if poss      (if not already sched)      Will rev all labs then      Chol is up some as well

## 2010-06-29 NOTE — Telephone Encounter (Signed)
Patient notified as instructed by telephone. Pt scheduled 30 min appt with Dr Milinda Antis 07/05/10 at 2pm.

## 2010-07-05 ENCOUNTER — Ambulatory Visit (INDEPENDENT_AMBULATORY_CARE_PROVIDER_SITE_OTHER): Payer: Medicare Other | Admitting: Family Medicine

## 2010-07-05 ENCOUNTER — Encounter: Payer: Self-pay | Admitting: Family Medicine

## 2010-07-05 VITALS — BP 110/78 | HR 96 | Temp 98.2°F | Ht 64.0 in | Wt 380.0 lb

## 2010-07-05 DIAGNOSIS — E538 Deficiency of other specified B group vitamins: Secondary | ICD-10-CM

## 2010-07-05 DIAGNOSIS — M25561 Pain in right knee: Secondary | ICD-10-CM | POA: Insufficient documentation

## 2010-07-05 DIAGNOSIS — E559 Vitamin D deficiency, unspecified: Secondary | ICD-10-CM

## 2010-07-05 DIAGNOSIS — M25569 Pain in unspecified knee: Secondary | ICD-10-CM

## 2010-07-05 DIAGNOSIS — E785 Hyperlipidemia, unspecified: Secondary | ICD-10-CM

## 2010-07-05 DIAGNOSIS — E119 Type 2 diabetes mellitus without complications: Secondary | ICD-10-CM

## 2010-07-05 DIAGNOSIS — L538 Other specified erythematous conditions: Secondary | ICD-10-CM

## 2010-07-05 DIAGNOSIS — F329 Major depressive disorder, single episode, unspecified: Secondary | ICD-10-CM

## 2010-07-05 MED ORDER — METFORMIN HCL 500 MG PO TABS: 500.0000 mg | ORAL_TABLET | Freq: Two times a day (BID) | ORAL | Status: DC

## 2010-07-05 NOTE — Assessment & Plan Note (Signed)
Per hx sounds like patellofemoral problem-  Hyperactive patella Obesity puts her at risk Ref sport med

## 2010-07-05 NOTE — Patient Instructions (Addendum)
We will schedule appt with Dr Patsy Lager at check out for knee pain  Cholesterol is up - eat low fat diet and stop fast food  Start low dose metformin for sugar  sched lab and follow up in 3 month Get your glucometer in working order to check sugar twice daily - am and 2 hours after a meal

## 2010-07-05 NOTE — Assessment & Plan Note (Signed)
Area acting up under L breast  Adv continue the nystop powder bid unti imp Also keep as clean and dry as possible

## 2010-07-05 NOTE — Assessment & Plan Note (Signed)
Stable/ improved on current dose No changes Rev labs

## 2010-07-05 NOTE — Assessment & Plan Note (Signed)
Worse now requiring medication Will again try low dose metformin Check sugar bid  Rev low glycemic diet in detail -- very difficult given emotional problems

## 2010-07-05 NOTE — Progress Notes (Signed)
Subjective:    Patient ID: Mackenzie Bonilla, female    DOB: 07/25/1965, 45 y.o.   MRN: [id number]  HPI Here for f/u of DM and lipids and D def and several acute problems  Hurt her knee and has rash   Wt is down 6 lb  a1c is up to 7.4 Up since she went on "new diet"  Seriously craves sugar -- and occ has to have some , tries not to buy sweet  Perhaps too much starch, no longer cooking (too much fast food and sugar)  No meds- ? Metformin in the past  No exercise at all   Cannot walk due to knee instability  This comes and goes  Chiropractor fixes it temporarily  It cracks and pops out of place Hurts to fully extend and flex her knee R Feels unstable   Rash under breast L is worse this am  Used px powder and it burned    Vit D38- stable on current dose   Lipids are up with LDL 130s Eating fast food  Told by her [facility] pharmacist to stop it due to muscle pain  Was on 80 mg simvastatin  Pain did improve  Lab Results  Component Value Date   CHOL 224* 06/23/2010   CHOL 152 06/17/2009   CHOL 156 03/07/2009   Lab Results  Component Value Date   HDL 49.10 06/23/2010   HDL 49 06/17/2009   HDL 50.30 03/07/2009   Lab Results  Component Value Date   LDLCALC 44 06/17/2009   LDLCALC 94 08/23/2008   LDLCALC 68 05/17/2008   Lab Results  Component Value Date   TRIG 306.0* 06/23/2010   TRIG 294* 06/17/2009   TRIG 227.0* 03/07/2009   Lab Results  Component Value Date   CHOLHDL 5 06/23/2010   CHOLHDL 3.1 Ratio 06/17/2009   CHOLHDL 3 03/07/2009   Lab Results  Component Value Date   LDLDIRECT 139.1 06/23/2010   LDLDIRECT 84.9 03/07/2009   LDLDIRECT 77.7 03/12/2006    Past Medical History  Diagnosis Date  . Allergy     allergic rhinitis  . Anxiety   . Depression   . Hyperlipidemia   . Arthritis     osteoarthritis  . Obesity   . Lymphedema   . Tachycardia   . Neuromuscular disorder     fibromyalgia  . Hyperhidrosis   . GERD (gastroesophageal reflux disease)     EGD negative  06/2001// EGD erythematous mucosa, polyp 08/2008  . Fatty liver 07/2008    abd. ultrasound - fatty liver ; slt dilated cbd (no stones ) 06/10// abd.  ultrasound normal on 04/2006    History   Social History  . Marital Status: Single    Spouse Name: N/A    Number of Children: N/A  . Years of Education: N/A   Occupational History  . Not on file.   Social History Main Topics  . Smoking status: Never Smoker   . Smokeless tobacco: Not on file  . Alcohol Use: No  . Drug Use: No  . Sexually Active: Not on file   Other Topics Concern  . Not on file   Social History Narrative  . No narrative on file     Review of Systems Review of Systems  Constitutional: Negative for fever, appetite change, and unexpected weight change. pos for fatigue  Eyes: Negative for pain and visual disturbance.  Respiratory: Negative for cough and shortness of breath.   Cardiovascular: Negative.for cp or sob  Gastrointestinal: Negative for nausea, diarrhea and constipation.  Genitourinary: Negative for urgency and frequency. neg for thirst Skin: Negative for pallor.pos for itchy rash  MSK pos for knee and generalized pain   Neurological: Negative for weakness, light-headedness, numbness and headaches.  Hematological: Negative for adenopathy. Does not bruise/bleed easily.  Psychiatric/Behavioral: pos for anx and depression, no SI          Objective:   Physical Exam  Constitutional: She appears well-developed and well-nourished.       Morbidly obese and depressed appearing   HENT:  Head: Normocephalic and atraumatic.  Mouth/Throat: Oropharynx is clear and moist.  Eyes: Conjunctivae and EOM are normal. Pupils are equal, round, and reactive to light.  Neck: Normal range of motion. Neck supple. No JVD present. Carotid bruit is not present. No thyromegaly present.  Cardiovascular: Normal rate, regular rhythm, normal heart sounds and intact distal pulses.   Pulmonary/Chest: Effort normal and breath  sounds normal. No respiratory distress. She has no wheezes.  Abdominal: Soft. Bowel sounds are normal. She exhibits no distension. There is no tenderness.  Musculoskeletal: She exhibits edema. She exhibits no tenderness.       Non pitting edema in ankles bilat  Generally poor rom knees  Obese legs  Patellar hypermobility suspected  Gait slow and nl  Lymphadenopathy:    She has no cervical adenopathy.  Neurological: She is alert. She has normal reflexes. Coordination normal.  Skin: Skin is warm and dry.       Erythema with sharp borders under L breast  Few satellite lesions No vesicles or skin breakdown           Assessment & Plan:

## 2010-07-05 NOTE — Assessment & Plan Note (Signed)
This is worse with poor diet Long disc about stopping fast food and following low sat fat diet  Currently off zocor for pain  Will re eval in future and see if statin is needed

## 2010-07-05 NOTE — Assessment & Plan Note (Signed)
Stable with shots on current sched No changes

## 2010-07-07 ENCOUNTER — Telehealth: Payer: Self-pay | Admitting: *Deleted

## 2010-07-07 MED ORDER — CLOTRIMAZOLE-BETAMETHASONE 1-0.05 % EX CREA: TOPICAL_CREAM | Freq: Two times a day (BID) | CUTANEOUS | Status: AC

## 2010-07-07 NOTE — Telephone Encounter (Signed)
Patient notified as instructed by telephone. Medication phoned to [facility]  pharmacy as instructed.

## 2010-07-07 NOTE — Telephone Encounter (Signed)
Sure Px written for call in   Let me  Know if no improvement Will try lotrisone cream

## 2010-07-07 NOTE — Telephone Encounter (Signed)
Pt is complaining of yeast under left breast.  She has been using powder and area is red, irritated, painful.  She is asking if she can switch from powder to lotion.  Uses [facility], which closes at 6.

## 2010-07-13 ENCOUNTER — Ambulatory Visit (INDEPENDENT_AMBULATORY_CARE_PROVIDER_SITE_OTHER)
Admission: RE | Admit: 2010-07-13 | Discharge: 2010-07-13 | Disposition: A | Discharge status: Home / Self Care | Payer: Medicare Other | Source: Ambulatory Visit | Attending: Family Medicine | Admitting: Family Medicine

## 2010-07-13 ENCOUNTER — Ambulatory Visit (INDEPENDENT_AMBULATORY_CARE_PROVIDER_SITE_OTHER): Payer: Medicare Other | Admitting: Family Medicine

## 2010-07-13 ENCOUNTER — Encounter: Payer: Self-pay | Admitting: Family Medicine

## 2010-07-13 VITALS — BP 140/80 | HR 119 | Temp 97.8°F | Ht 64.0 in | Wt 380.0 lb

## 2010-07-13 DIAGNOSIS — M25569 Pain in unspecified knee: Secondary | ICD-10-CM

## 2010-07-13 DIAGNOSIS — M1990 Unspecified osteoarthritis, unspecified site: Secondary | ICD-10-CM

## 2010-07-13 DIAGNOSIS — M25561 Pain in right knee: Secondary | ICD-10-CM

## 2010-07-13 NOTE — Progress Notes (Signed)
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  She also complains of significant medial pain.  In the last few months, her knee will hurt almost all the time.  Some on the left, but on the right is where is is really hurting.  She does fall a lot. Has been a year or more where she falls on her knee.   She does describe sensation of intermittent instability, where her knee within about, primarily on the right. She does have significant pain and problems going up and down stairs. This had some limitation in her motion. No mechanical locking up of her joint. No prior history of traumatic fracture or operative intervention.  Patient Active Problem List  Diagnoses  . DIABETES MELLITUS  . POLYCYSTIC OVARIAN DISEASE  . VITAMIN B12 DEFICIENCY  . VITAMIN D DEFICIENCY  . HYPERLIPIDEMIA  . OBESITY, MORBID  . ANXIETY  . PANIC DISORDER  . BULIMIA  . HEADACHE, TENSION  . DEPRESSION  . CARPAL TUNNEL SYNDROME  . LYMPHEDEMA  . ALLERGIC RHINITIS  . ASTHMA, MILD, INTERMITTENT  . TMJ SYNDROME  . GERD  . IRRITABLE BOWEL SYNDROME  . PROCTITIS  . HOT FLASHES  . INTERTRIGO, CANDIDAL  . SKIN TAG  . SEBORRHEIC KERATOSIS, INFLAMED  . OSTEOARTHRITIS  . FIBROMYALGIA  . CHRONIC FATIGUE SYNDROME  . HYPERHIDROSIS  . COUGH, CHRONIC  . DIARRHEA, ACUTE, CHRONIC  . DIVERTICULITIS, HX OF  . Right knee pain   Past Medical History  Diagnosis Date  . Allergy     allergic rhinitis  . Anxiety   . Depression   . Hyperlipidemia   . Arthritis     osteoarthritis  . Obesity   . Lymphedema   . Tachycardia   . Neuromuscular disorder     fibromyalgia  . Hyperhidrosis   . GERD (gastroesophageal reflux disease)     EGD negative 06/2001// EGD erythematous mucosa, polyp 08/2008  . Fatty liver 07/2008    abd. ultrasound - fatty liver ; slt dilated cbd (no stones ) 06/10// abd.   ultrasound normal on 04/2006   Past Surgical History  Procedure Date  . Tonsillectomy   . Septoplasty   . Foot surgery   . Uterine tumor 09/2001  . Vagus nerve stimulator insertion   . Endometrial biopsy 01/2004  . Uterine fibroid surgery 06/2005    ablation   History  Substance Use Topics  . Smoking status: Never Smoker   . Smokeless tobacco: Not on file  . Alcohol Use: No   Family History  Problem Relation Age of Onset  . Hypertension Father   . Cancer Father     pancreatic cancer  . Hypertension Sister   . Heart disease Maternal Grandmother 60    MI  . Heart disease Paternal Grandmother 18    MI   Allergies  Allergen Reactions  . Cephalexin   . Codeine     REACTION: ? reaction  . Duloxetine   . Hydrocodone     REACTION: ? reaction  . Norelgestromin-Eth Estradiol   . Prednisone     REACTION: ? reaction  . Quetiapine   . Sertraline Hcl     REACTION: vivid dreams  . Triazolam     REACTION: ? reaction  . Zaleplon   . Zocor (Simvastatin - High Dose)     achey  .  Zolpidem Tartrate     REACTION: ? reaction   Current Outpatient Prescriptions on File Prior to Visit  Medication Sig Dispense Refill  . acetaminophen (TYLENOL) 500 MG tablet Take 500 mg by mouth every 6 (six) hours as needed. OTC as directed       . ALPRAZolam (XANAX) 1 MG tablet Take 1 mg by mouth at bedtime as needed. Two tablets at bedtime and as needed during the day       . aluminum chloride (DRYSOL) 20 % external solution Apply topically at bedtime. Apply to affected areas at bedtime - then wash off in the AM once sweating improves - can use twice weekly       . benzonatate (TESSALON) 100 MG capsule Take 100 mg by mouth 3 (three) times daily as needed. 1-2 by mouth up to three times a day as needed       . Cholecalciferol (VITAMIN D) 1000 UNITS capsule Take 1,000 Units by mouth daily.        . CloNIDine HCl 0.1 MG TB12 Take by mouth 2 (two) times daily. Take 1 tablet by mouth twice a day       .  clotrimazole-betamethasone (LOTRISONE) cream Apply topically 2 (two) times daily.  15 g  0  . cyanocobalamin (,VITAMIN B-12,) 1000 MCG/ML injection Inject 1,000 mcg into the muscle once. 1 ml (1000 mcg) IM as directed every month       . cyclobenzaprine (FLEXERIL) 5 MG tablet TAKE ONE-HALF TO ONE TABLET THREE       TIMES A DAY AS NEEDED FOR NECK PAIN     AND SPASM  30 tablet  1  . drospirenone-ethinyl estradiol (YASMIN) 3-0.03 MG per tablet Take 1 tablet by mouth daily. Continuous dosing take by mouth       . FLUoxetine (PROZAC) 40 MG capsule Take 80 mg by mouth daily.       Marland Kitchen guaifenesin (REFENESEN 400) 400 MG TABS Take 400 mg by mouth every 4 (four) hours as needed. OTC as directed       . ketoconazole (NIZORAL) 2 % cream Apply topically daily. Apply to affected area once daily for 2 weeks       . lidocaine (LIDODERM) 5 % Place 1 patch onto the skin daily. Remove & Discard patch within 12 hours or as directed by MD       . metFORMIN (GLUCOPHAGE) 500 MG tablet Take 1 tablet (500 mg total) by mouth 2 (two) times daily with a meal.  60 tablet  11  . naphazoline-glycerin (CLEAR EYES) 0.012-0.2 % SOLN Place 1-2 drops into both eyes every 4 (four) hours as needed. OTC as directed       . nystatin (NYSTOP) 100000 UNIT/GM POWD Apply topically 2 (two) times daily as needed. Apply to affected area two times a day until clear asneeded       . omeprazole (PRILOSEC) 40 MG capsule Take 40 mg by mouth 2 (two) times daily.        Marland Kitchen spironolactone (ALDACTONE) 50 MG tablet Take 50 mg by mouth daily.        . TraMADol HCl 50 MG TBSO Take by mouth every 6 (six) hours as needed. 1 every 6 hours as needed for pain; may combine with 1 tylenol 500 mg by mouth       . traZODone (DESYREL) 50 MG tablet Take 6 tablets  By mouth at bedtime. But can take up to 9 tablets daily       .  desoximetasone (TOPICORT) 0.25 % cream Apply topically. Apply as directed       . traZODone (DESYREL) 150 MG tablet Take 150 mg by mouth at  bedtime. 2 to 3 tablets by mouth at bedtime       REVIEW OF SYSTEMS  GEN: No fevers, chills. Nontoxic. Primarily MSK c/o today. MSK: Detailed in the HPI GI: tolerating PO intake without difficulty Neuro: No numbness, parasthesias, or tingling associated. Otherwise the pertinent positives of the ROS are noted above.    Physical Exam  Blood pressure 140/80, pulse 119, temperature 97.8 F (36.6 C), temperature source Oral, height 5\' 4"  (1.626 m), weight 380 lb (172.367 kg), SpO2 98.00%.  GEN: Well-developed, morbidly obese,in no acute distress; alert,appropriate and cooperative throughout examination HEENT: Normocephalic and atraumatic without obvious abnormalities. Ears, externally no deformities PULM: Breathing comfortably in no respiratory distress EXT: No clubbing, cyanosis, or edema PSYCH: Normally interactive. Cooperative during the interview. Pleasant. Friendly and conversant. Not anxious or depressed appearing. Normal, full affect.  Gait: mildly antalgic ROM: 0-110 Effusion: neg Echymosis or edema: none Patellar tendon NT Painful PLICA: neg Patellar grind: pos PATELLAR APPREHENSION NEG EFFECTIVELY NO PATELLAR MOTION ON THE R Medial and lateral patellar facet loading: PAINFUL medial and lateral joint lines: TTP MEDIAL JOINT LINE Mcmurray's pos for pain Flexion-pinch pos Varus and valgus stress: stable Lachman: neg Ant and Post drawer: neg Hip abduction, IR, ER: WNL Hip flexion str: 4+/5 Hip abd: 4+/5 Quad: 4+/5 VMO atrophy: yes - extensive adipose Hamstring concentric and eccentric: 5/5  A/P: Right knee pain. Right knee osteoarthritis.   X-rays: AP Bilateral Weight-bearing, Weightbearing Lateral, Sunrise views Indication: knee pain Findings: There is a moderate degree of osteoarthritic changes, predominantly in the medial compartment as well as the patellofemoral compartment. Notable superior patellar osteophyte.  Moderate osteoarthritis in a patient with  advanced osteoarthritis for age with a BMI of 65. Her prognosis is not good.  I encouraged the patient to lose weight at all costs.  Examination is not consistent with patellar instability. Cannot rule out distantly, but at this point patella is relatively immobile secondary to arthritis. I suspect that her sensation of instability is primarily come from her lack of proprioception and lack of strength for support of her weight.  Arthritic pathophysiology reviewed. Strongly encouraged exercise in a pool. Basic quadricep and hip routine from [facility] was reviewed with the patient. Also encouraged her to use tramadol and Tylenol up to 4 times daily for pain control.

## 2010-07-13 NOTE — Patient Instructions (Signed)
OSTEOARTHRITIS:  For symptomatic relief: Tylenol: 2 tablets up to 3-4 times a day Regular NSAIDS are helpful (avoid in kidney disease and ulcers) Topical Capzaicin Cream, as needed (wear glove to put on)  For flares, corticosteroid injections help. Hyaluronic Acid injections have good success, average relief is 6 months  Glucosamine and Chondroitin often helpful - will take about 3 months to see if you have an effect. If you do, great, keep them up, if none at that point, no need to take in the future.  Omega-3 fish oils may help, 2 grams daily Ice joints on bad days, 20 min, 2-3 x / day REGULAR EXERCISE: swimming, Yoga, Tai Chi, bicycle (NON-IMPACT activity)  

## 2010-08-02 ENCOUNTER — Encounter: Payer: Self-pay | Admitting: Family Medicine

## 2010-08-02 ENCOUNTER — Ambulatory Visit (INDEPENDENT_AMBULATORY_CARE_PROVIDER_SITE_OTHER): Payer: Medicare Other | Admitting: Family Medicine

## 2010-08-02 DIAGNOSIS — N390 Urinary tract infection, site not specified: Secondary | ICD-10-CM | POA: Insufficient documentation

## 2010-08-02 DIAGNOSIS — J019 Acute sinusitis, unspecified: Secondary | ICD-10-CM

## 2010-08-02 DIAGNOSIS — B9689 Other specified bacterial agents as the cause of diseases classified elsewhere: Secondary | ICD-10-CM | POA: Insufficient documentation

## 2010-08-02 DIAGNOSIS — M549 Dorsalgia, unspecified: Secondary | ICD-10-CM

## 2010-08-02 LAB — POCT URINALYSIS DIPSTICK
Nitrite, UA: NEGATIVE
Urobilinogen, UA: 0.2
pH, UA: 6

## 2010-08-02 MED ORDER — LEVOFLOXACIN 500 MG PO TABS: 500.0000 mg | ORAL_TABLET | Freq: Every day | ORAL | Status: AC

## 2010-08-02 NOTE — Assessment & Plan Note (Signed)
Pos ua  Urine micro with 2-3 wbc, 3-5 rbc, few bacteria , 1-2 epi/ no casts or other findings tx with levaquin  Fluids ucx Update if worse or no improvement

## 2010-08-02 NOTE — Assessment & Plan Note (Signed)
With nasal cong and green d/c and maxillary pain  tx with levaquin (also covering uti) Update if not imp Recommend saline ns- simply saline

## 2010-08-02 NOTE — Patient Instructions (Signed)
Drink lots of fluids to help sinus and urine infection  Take levaquin as directed We will culture urine  If worse or not improving next week please let us know Use some nasal saline spray in nose

## 2010-08-02 NOTE — Progress Notes (Signed)
Subjective:    Patient ID: Mackenzie Bonilla, female    DOB: [birth year], 45 y.o.   MRN: [id number]  HPI Here for uti and sinus symptoms   Urine symptoms for several months  Some pressure at top of labia and pubic area / at times painful to urinate Pressure remains even after emptying bladder  This comes and goes  Yesterday was worse -- radiated to low back (not as bad as a kidney stone)  Took some tramadol   Sinus congestion  Pain under eyes and also in top teeth Eyes are uncomfortable Allergies have been bad lately  ENT gave her nasal spray on she lost it   Green d/c from nose  Some cough too   Patient Active Problem List  Diagnoses  . DIABETES MELLITUS  . POLYCYSTIC OVARIAN DISEASE  . VITAMIN B12 DEFICIENCY  . VITAMIN D DEFICIENCY  . HYPERLIPIDEMIA  . OBESITY, MORBID  . ANXIETY  . PANIC DISORDER  . BULIMIA  . HEADACHE, TENSION  . DEPRESSION  . CARPAL TUNNEL SYNDROME  . LYMPHEDEMA  . ALLERGIC RHINITIS  . ASTHMA, MILD, INTERMITTENT  . TMJ SYNDROME  . GERD  . IRRITABLE BOWEL SYNDROME  . PROCTITIS  . HOT FLASHES  . INTERTRIGO, CANDIDAL  . SKIN TAG  . SEBORRHEIC KERATOSIS, INFLAMED  . OSTEOARTHRITIS  . FIBROMYALGIA  . CHRONIC FATIGUE SYNDROME  . HYPERHIDROSIS  . COUGH, CHRONIC  . DIARRHEA, ACUTE, CHRONIC  . DIVERTICULITIS, HX OF  . Right knee pain  . UTI (lower urinary tract infection)  . Acute sinusitis   Past Medical History  Diagnosis Date  . Allergy     allergic rhinitis  . Anxiety   . Depression   . Hyperlipidemia   . Arthritis     osteoarthritis  . Obesity   . Lymphedema   . Tachycardia   . Neuromuscular disorder     fibromyalgia  . Hyperhidrosis   . GERD (gastroesophageal reflux disease)     EGD negative 06/2001// EGD erythematous mucosa, polyp 08/2008  . Fatty liver 07/2008    abd. ultrasound - fatty liver ; slt dilated cbd (no stones ) 06/10// abd.  ultrasound normal on 04/2006   Past Surgical History  Procedure Date  .  Tonsillectomy   . Septoplasty   . Foot surgery   . Uterine tumor 09/2001  . Vagus nerve stimulator insertion   . Endometrial biopsy 01/2004  . Uterine fibroid surgery 06/2005    ablation   History  Substance Use Topics  . Smoking status: Never Smoker   . Smokeless tobacco: Not on file  . Alcohol Use: No   Family History  Problem Relation Age of Onset  . Hypertension Father   . Cancer Father     pancreatic cancer  . Hypertension Sister   . Heart disease Maternal Grandmother 60    MI  . Heart disease Paternal Grandmother 79    MI   Allergies  Allergen Reactions  . Cephalexin   . Codeine     REACTION: ? reaction  . Duloxetine   . Hydrocodone     REACTION: ? reaction  . Norelgestromin-Eth Estradiol   . Prednisone     REACTION: ? reaction  . Quetiapine   . Sertraline Hcl     REACTION: vivid dreams  . Triazolam     REACTION: ? reaction  . Zaleplon   . Zocor (Simvastatin - High Dose)     achey  . Zolpidem Tartrate  REACTION: ? reaction   Current Outpatient Prescriptions on File Prior to Visit  Medication Sig Dispense Refill  . acetaminophen (TYLENOL) 500 MG tablet Take 500 mg by mouth every 6 (six) hours as needed. OTC as directed       . ALPRAZolam (XANAX) 1 MG tablet Take 1 mg by mouth at bedtime as needed. Two tablets at bedtime and as needed during the day       . aluminum chloride (DRYSOL) 20 % external solution Apply topically at bedtime. Apply to affected areas at bedtime - then wash off in the AM once sweating improves - can use twice weekly       . benzonatate (TESSALON) 100 MG capsule Take 100 mg by mouth 3 (three) times daily as needed. 1-2 by mouth up to three times a day as needed       . Cholecalciferol (VITAMIN D) 1000 UNITS capsule Take 1,000 Units by mouth daily.        . CloNIDine HCl 0.1 MG TB12 Take by mouth 2 (two) times daily. Take 1 tablet by mouth twice a day       . clotrimazole-betamethasone (LOTRISONE) cream Apply topically 2 (two) times  daily.  15 g  0  . cyanocobalamin (,VITAMIN B-12,) 1000 MCG/ML injection Inject 1,000 mcg into the muscle once. 1 ml (1000 mcg) IM as directed every month       . cyclobenzaprine (FLEXERIL) 5 MG tablet TAKE ONE-HALF TO ONE TABLET THREE       TIMES A DAY AS NEEDED FOR NECK PAIN     AND SPASM  30 tablet  1  . desoximetasone (TOPICORT) 0.25 % cream Apply topically. Apply as directed       . drospirenone-ethinyl estradiol (YASMIN) 3-0.03 MG per tablet Take 1 tablet by mouth daily. Continuous dosing take by mouth       . FLUoxetine (PROZAC) 40 MG capsule Take 80 mg by mouth daily.       Marland Kitchen guaifenesin (REFENESEN 400) 400 MG TABS Take 400 mg by mouth every 4 (four) hours as needed. OTC as directed       . ketoconazole (NIZORAL) 2 % cream Apply topically daily. Apply to affected area once daily for 2 weeks       . lidocaine (LIDODERM) 5 % Place 1 patch onto the skin daily. Remove & Discard patch within 12 hours or as directed by MD       . metFORMIN (GLUCOPHAGE) 500 MG tablet Take 1 tablet (500 mg total) by mouth 2 (two) times daily with a meal.  60 tablet  11  . naphazoline-glycerin (CLEAR EYES) 0.012-0.2 % SOLN Place 1-2 drops into both eyes every 4 (four) hours as needed. OTC as directed       . nystatin (NYSTOP) 100000 UNIT/GM POWD Apply topically 2 (two) times daily as needed. Apply to affected area two times a day until clear asneeded       . omeprazole (PRILOSEC) 40 MG capsule Take 40 mg by mouth 2 (two) times daily.        Marland Kitchen spironolactone (ALDACTONE) 50 MG tablet Take 50 mg by mouth daily.        . TraMADol HCl 50 MG TBSO Take by mouth every 6 (six) hours as needed. 1 every 6 hours as needed for pain; may combine with 1 tylenol 500 mg by mouth       . traZODone (DESYREL) 150 MG tablet Take 150 mg by mouth at bedtime. 2  to 3 tablets by mouth at bedtime      . traZODone (DESYREL) 50 MG tablet Take 6 tablets  By mouth at bedtime. But can take up to 9 tablets daily            Review of  Systems Review of Systems  Constitutional: Negative for fever, appetite change, and unexpected weight change. pos for fatigue ENT pos for congestion and facial pain / no st Eyes: Negative for pain and visual disturbance.  Respiratory: Negative for cough and shortness of breath.   Cardiovascular: Negative.  for cp or palpitati Gastrointestinal: Negative for nausea, diarrhea and constipation.  Genitourinary: pos for urgency and urinary discomfort / neg for hematuria  Skin: Negative for pallor.  Neurological: Negative for weakness, light-headedness, numbness and headaches.  Hematological: Negative for adenopathy. Does not bruise/bleed easily.  Psychiatric/Behavioral: Negative for dysphoric mood. The patient is not nervous/anxious.          Objective:   Physical Exam  Constitutional: She appears well-developed and well-nourished. No distress.       Obese and fatigued appearing   HENT:  Head: Normocephalic and atraumatic.  Right Ear: External ear normal.  Left Ear: External ear normal.  Mouth/Throat: Oropharynx is clear and moist.       bilat maxillary sinus tenderness Nares are congested and injected  Some post nasal drip   Eyes: Conjunctivae and EOM are normal. Pupils are equal, round, and reactive to light. Right eye exhibits no discharge. Left eye exhibits no discharge.  Neck: Normal range of motion. Neck supple. No JVD present. No thyromegaly present.  Cardiovascular: Normal rate, regular rhythm and normal heart sounds.   Pulmonary/Chest: Effort normal and breath sounds normal. No respiratory distress. She has no wheezes. She has no rales.  Abdominal: Soft. Bowel sounds are normal. There is no rebound and no guarding.       Mild suprapubic tenderness  Musculoskeletal: She exhibits no edema.       No cva tenderness   Lymphadenopathy:    She has no cervical adenopathy.  Neurological: She is alert. She has normal reflexes. She displays normal reflexes.  Skin: Skin is warm and  dry. No rash noted. No erythema. No pallor.  Psychiatric:       Generally depressed - baseline Is pleasant today          Assessment & Plan:

## 2010-08-04 ENCOUNTER — Telehealth: Payer: Self-pay | Admitting: *Deleted

## 2010-08-04 LAB — URINE CULTURE: Organism ID, Bacteria: NO GROWTH

## 2010-08-04 NOTE — Telephone Encounter (Signed)
If not improving, may try monistat. If not better with monistat, may need to return for ov.

## 2010-08-04 NOTE — Telephone Encounter (Signed)
Spoke with patient. She denies any pain and/or discharge at this time-only the itching, pressure and odor. She hasn't tried OTC for yeast. She said that Dr. Milinda Antis put her on the Levaquin to cover both sinus and bladder if indeed she had a UTI. I told her that was correct and that it would cover both, but since culture came back negative that it was likely she didn't have one. I told her I would call her back with suggestion of OTC monistat or Rx.

## 2010-08-04 NOTE — Telephone Encounter (Signed)
Reviewed note.  UCx returned no growth.  Taking levaquin for sinusitis.  Any vaginal discharge?  Any pain? Would have her finish abx, push fluids, and if not better to update [place], may need return visit for further evaluation. Pressure should improve after a few days of abx if bladder infection.

## 2010-08-04 NOTE — Telephone Encounter (Signed)
Uses [facility] if needed.

## 2010-08-04 NOTE — Telephone Encounter (Signed)
Patient notified and will try OTC monistat if no better. She will come back if no improvement.

## 2010-08-04 NOTE — Telephone Encounter (Signed)
Patient was seen two day ago and she says that she is still having some pressure when she urinates. Also has had a lot of itching and odor and didn't know if maybe it was from the antibiotic. I advised her that she could possibly have yeast infection from the antibiotic. She is asking if she should use something for possible yeast infection and also how long before the pressure should start to go away.

## 2010-08-09 ENCOUNTER — Ambulatory Visit (INDEPENDENT_AMBULATORY_CARE_PROVIDER_SITE_OTHER): Payer: Medicare Other | Admitting: Family Medicine

## 2010-08-09 ENCOUNTER — Encounter: Payer: Self-pay | Admitting: Family Medicine

## 2010-08-09 ENCOUNTER — Telehealth: Payer: Self-pay

## 2010-08-09 VITALS — BP 102/80 | HR 106 | Temp 97.6°F | Ht 64.0 in | Wt 363.5 lb

## 2010-08-09 DIAGNOSIS — N898 Other specified noninflammatory disorders of vagina: Secondary | ICD-10-CM

## 2010-08-09 DIAGNOSIS — R319 Hematuria, unspecified: Secondary | ICD-10-CM | POA: Insufficient documentation

## 2010-08-09 DIAGNOSIS — R300 Dysuria: Secondary | ICD-10-CM

## 2010-08-09 DIAGNOSIS — N939 Abnormal uterine and vaginal bleeding, unspecified: Secondary | ICD-10-CM | POA: Insufficient documentation

## 2010-08-09 LAB — POCT URINALYSIS DIPSTICK
Ketones, UA: 40
Protein, UA: 30
pH, UA: 5

## 2010-08-09 NOTE — Telephone Encounter (Signed)
Patient notified as instructed by telephone. Pt said she saw Dr Dayton Martes this morning and has appt to see urologist in [facility] tomorrow 08/10/10.

## 2010-08-09 NOTE — Telephone Encounter (Signed)
Appreciate the update 

## 2010-08-09 NOTE — Progress Notes (Signed)
Subjective:    Patient ID: Mackenzie Bonilla, female    DOB: [birth year], 45 y.o.   MRN: [id number]  HPI 45 yo here pt of Dr. Royden Purl here for ? Irregular vaginal bleeding. Saw Dr. Milinda Antis last week for URI and UTI symptoms, notes reviewed. UA pos- placed on Levaquin, three doses left.  Urine culture negative. Right lower quadrant pain has improved but still has some suprapubic pressure and ?vaginal bleeding.   These symptoms have been intermittent for past several months.   Levaquin making her dizzy and nauseated.  Has a h/o nephrolithiasis but feels this pain is different.  S/p uterine ablation in 2007 so has not had any uterine bleeding since, but past several months, has bright red blood on toilet paper when she wipes.   Due to her body habitus, she is not sure if it's coming from her urine or her vaginal area.   Patient Active Problem List  Diagnoses  . DIABETES MELLITUS  . POLYCYSTIC OVARIAN DISEASE  . VITAMIN B12 DEFICIENCY  . VITAMIN D DEFICIENCY  . HYPERLIPIDEMIA  . OBESITY, MORBID  . ANXIETY  . PANIC DISORDER  . BULIMIA  . HEADACHE, TENSION  . DEPRESSION  . CARPAL TUNNEL SYNDROME  . LYMPHEDEMA  . ALLERGIC RHINITIS  . ASTHMA, MILD, INTERMITTENT  . TMJ SYNDROME  . GERD  . IRRITABLE BOWEL SYNDROME  . PROCTITIS  . HOT FLASHES  . INTERTRIGO, CANDIDAL  . SKIN TAG  . SEBORRHEIC KERATOSIS, INFLAMED  . OSTEOARTHRITIS  . FIBROMYALGIA  . CHRONIC FATIGUE SYNDROME  . HYPERHIDROSIS  . COUGH, CHRONIC  . DIARRHEA, ACUTE, CHRONIC  . DIVERTICULITIS, HX OF  . Right knee pain  . UTI (lower urinary tract infection)  . Acute sinusitis   Past Medical History  Diagnosis Date  . Allergy     allergic rhinitis  . Anxiety   . Depression   . Hyperlipidemia   . Arthritis     osteoarthritis  . Obesity   . Lymphedema   . Tachycardia   . Neuromuscular disorder     fibromyalgia  . Hyperhidrosis   . GERD (gastroesophageal reflux disease)     EGD negative 06/2001// EGD  erythematous mucosa, polyp 08/2008  . Fatty liver 07/2008    abd. ultrasound - fatty liver ; slt dilated cbd (no stones ) 06/10// abd.  ultrasound normal on 04/2006   Past Surgical History  Procedure Date  . Tonsillectomy   . Septoplasty   . Foot surgery   . Uterine tumor 09/2001  . Vagus nerve stimulator insertion   . Endometrial biopsy 01/2004  . Uterine fibroid surgery 06/2005    ablation   History  Substance Use Topics  . Smoking status: Never Smoker   . Smokeless tobacco: Not on file  . Alcohol Use: No   Family History  Problem Relation Age of Onset  . Hypertension Father   . Cancer Father     pancreatic cancer  . Hypertension Sister   . Heart disease Maternal Grandmother 60    MI  . Heart disease Paternal Grandmother 43    MI   Allergies  Allergen Reactions  . Cephalexin   . Codeine     REACTION: ? reaction  . Duloxetine   . Hydrocodone     REACTION: ? reaction  . Norelgestromin-Eth Estradiol   . Prednisone     REACTION: ? reaction  . Quetiapine   . Sertraline Hcl     REACTION: vivid dreams  . Triazolam  REACTION: ? reaction  . Zaleplon   . Zocor (Simvastatin - High Dose)     achey  . Zolpidem Tartrate     REACTION: ? reaction   Current Outpatient Prescriptions on File Prior to Visit  Medication Sig Dispense Refill  . acetaminophen (TYLENOL) 500 MG tablet Take 500 mg by mouth every 6 (six) hours as needed. OTC as directed       . ALPRAZolam (XANAX) 1 MG tablet Take 1 mg by mouth at bedtime as needed. Two tablets at bedtime and as needed during the day       . aluminum chloride (DRYSOL) 20 % external solution Apply topically at bedtime. Apply to affected areas at bedtime - then wash off in the AM once sweating improves - can use twice weekly       . benzonatate (TESSALON) 100 MG capsule Take 100 mg by mouth 3 (three) times daily as needed. 1-2 by mouth up to three times a day as needed       . Cholecalciferol (VITAMIN D) 1000 UNITS capsule Take  1,000 Units by mouth daily.        . CloNIDine HCl 0.1 MG TB12 Take by mouth 2 (two) times daily. Take 1 tablet by mouth twice a day       . clotrimazole-betamethasone (LOTRISONE) cream Apply topically 2 (two) times daily.  15 g  0  . cyanocobalamin (,VITAMIN B-12,) 1000 MCG/ML injection Inject 1,000 mcg into the muscle once. 1 ml (1000 mcg) IM as directed every month       . cyclobenzaprine (FLEXERIL) 5 MG tablet TAKE ONE-HALF TO ONE TABLET THREE       TIMES A DAY AS NEEDED FOR NECK PAIN     AND SPASM  30 tablet  1  . desoximetasone (TOPICORT) 0.25 % cream Apply topically. Apply as directed       . drospirenone-ethinyl estradiol (YASMIN) 3-0.03 MG per tablet Take 1 tablet by mouth daily. Continuous dosing take by mouth       . FLUoxetine (PROZAC) 40 MG capsule Take 80 mg by mouth daily.       Marland Kitchen guaifenesin (REFENESEN 400) 400 MG TABS Take 400 mg by mouth every 4 (four) hours as needed. OTC as directed       . ketoconazole (NIZORAL) 2 % cream Apply topically daily. Apply to affected area once daily for 2 weeks       . levofloxacin (LEVAQUIN) 500 MG tablet Take 1 tablet (500 mg total) by mouth daily.  10 tablet  0  . lidocaine (LIDODERM) 5 % Place 1 patch onto the skin daily. Remove & Discard patch within 12 hours or as directed by MD       . metFORMIN (GLUCOPHAGE) 500 MG tablet Take 1 tablet (500 mg total) by mouth 2 (two) times daily with a meal.  60 tablet  11  . naphazoline-glycerin (CLEAR EYES) 0.012-0.2 % SOLN Place 1-2 drops into both eyes every 4 (four) hours as needed. OTC as directed       . nystatin (NYSTOP) 100000 UNIT/GM POWD Apply topically 2 (two) times daily as needed. Apply to affected area two times a day until clear asneeded       . omeprazole (PRILOSEC) 40 MG capsule Take 40 mg by mouth 2 (two) times daily.        Marland Kitchen spironolactone (ALDACTONE) 50 MG tablet Take 50 mg by mouth daily.        . TraMADol HCl 50  MG TBSO Take by mouth every 6 (six) hours as needed. 1 every 6 hours as  needed for pain; may combine with 1 tylenol 500 mg by mouth       . traZODone (DESYREL) 150 MG tablet Take 150 mg by mouth at bedtime. 2 to 3 tablets by mouth at bedtime      . traZODone (DESYREL) 50 MG tablet Take 6 tablets  By mouth at bedtime. But can take up to 9 tablets daily         The PMH, PSH, Social History, Family History, Medications, and allergies have been reviewed in [facility], and have been updated if relevant.   Review of Systems  See HPI     Objective:   Physical Exam  BP 102/80  Pulse 106  Temp(Src) 97.6 F (36.4 C) (Oral)  Ht 5\' 4"  (1.626 m)  Wt 363 lb 8 oz (164.883 kg)  BMI 62.39 kg/m2  Constitutional: She appears well-developed and well-nourished. No distress.       Obese and fatigued appearing   HENT:  Head: Normocephalic and atraumatic.  Abdominal: Soft. Bowel sounds are normal. There is no rebound and no guarding.     Musculoskeletal: She exhibits no edema.       No cva tenderness   Lymphadenopathy:    She has no cervical adenopathy.  Neurological: She is alert. She has normal reflexes. She displays normal reflexes.  Skin: Skin is warm and dry. No rash noted. No erythema. No pallor.  Psychiatric:       Generally depressed - baseline per office notes Rectal:  no external abnormalities.   Genitalia:  Pelvic Exam:        External: normal female genitalia without lesions or masses, + small abrasion on outer left labia        Vagina: normal without lesions or masses        Cervix: normal without lesions or masses              Assessment & Plan:   1. Vaginal bleeding   Etiology unclear and does not seem to be vaginal source unless it is from labial abrasion. Due to body habitus, pelvic exam was difficult. UA again positive for blood but unlikely UTI given urine culture from last week was negative. Will resend for culture but will also send to urology for further work up of hematuria. The patient indicates understanding of these issues and agrees with the  plan.

## 2010-08-09 NOTE — Patient Instructions (Signed)
Let's stop taking the Levaquin. Please stop by to see Aram Beecham on your way out.

## 2010-08-09 NOTE — Telephone Encounter (Signed)
Message copied by Patience Musca on Wed Aug 09, 2010  1:59 PM ------      Message from: Roxy Manns A      Created: Fri Aug 04, 2010  4:26 PM       Urine cx came back neg      Continue med for sinus infx (levaquin)      Let me know if symptoms do not imp       Avoid any beverages besides water

## 2010-08-14 NOTE — Progress Notes (Signed)
Thanks for the update

## 2010-08-15 ENCOUNTER — Telehealth: Payer: Self-pay | Admitting: *Deleted

## 2010-08-15 NOTE — Telephone Encounter (Signed)
I think he does them here? - please check on that and if so can schedule if he gives the ok thanks

## 2010-08-15 NOTE — Telephone Encounter (Signed)
Patient says that she saw Dr. Patsy Lager a couple of weeks ago for her knee and he suggested her having a hyaluronic acid injection. Patient is asking where she could go for this injection.

## 2010-08-17 ENCOUNTER — Ambulatory Visit: Payer: Self-pay | Admitting: Urology

## 2010-08-21 ENCOUNTER — Telehealth: Payer: Self-pay

## 2010-08-21 NOTE — Telephone Encounter (Signed)
Thanks for the update - please make sure she does end up getting appt with Dr Patsy Lager

## 2010-08-21 NOTE — Telephone Encounter (Signed)
Patient notified as instructed by telephone that we are waiting to hear from Dr Patsy Lager about injection and she said that was fine. Pt also wanted Dr Milinda Antis to know that she is to see Dr Orson Slick Urologist to get results of CT scan.

## 2010-08-21 NOTE — Telephone Encounter (Signed)
Call --- I want you to call and speak to this patient directly, not a message and not on phone tree  I would be happy to do them. I do them in the office. A very reasonable option.   (The product I use is called Synvisc. I have some in stock now) It is an injection series of 3 scheduled 1 week apart.  Please schedule her an appointment on Monday 09/11/2010 and on the next successive 2 Mondays. 8/6 and 8/13

## 2010-08-21 NOTE — Telephone Encounter (Signed)
Spoke with Herbert Seta, Dr Copland's nurse and she is not familiar with that type injection and suggest checking with Dr Patsy Lager. Left message for pt to callback.

## 2010-08-21 NOTE — Telephone Encounter (Signed)
Spoke with Herbert Seta, Dr Copland's nurse and she will let me know Dr Copland's response and then will do appt.

## 2010-08-21 NOTE — Telephone Encounter (Signed)
Patient advised and also advised that a prior authorization from insurance would needed before shots can be recieved but, appt shedules

## 2010-08-22 NOTE — Telephone Encounter (Signed)
Patient set up for injection here with dr copland

## 2010-08-23 NOTE — Telephone Encounter (Signed)
Noted and forms completed

## 2010-09-11 ENCOUNTER — Encounter: Payer: Self-pay | Admitting: Family Medicine

## 2010-09-11 ENCOUNTER — Ambulatory Visit (INDEPENDENT_AMBULATORY_CARE_PROVIDER_SITE_OTHER): Payer: Medicare Other | Admitting: Family Medicine

## 2010-09-11 DIAGNOSIS — M25561 Pain in right knee: Secondary | ICD-10-CM

## 2010-09-11 DIAGNOSIS — J0100 Acute maxillary sinusitis, unspecified: Secondary | ICD-10-CM

## 2010-09-11 DIAGNOSIS — M25569 Pain in unspecified knee: Secondary | ICD-10-CM

## 2010-09-11 DIAGNOSIS — M1990 Unspecified osteoarthritis, unspecified site: Secondary | ICD-10-CM

## 2010-09-11 MED ORDER — AZITHROMYCIN 250 MG PO TABS: ORAL_TABLET | ORAL | Status: DC

## 2010-09-11 NOTE — Patient Instructions (Signed)
F/u with me next Monday, then following Monday

## 2010-09-11 NOTE — Progress Notes (Signed)
Mackenzie Bonilla, a 45 y.o. female presents today in the office for the following:    OA, reviewed films: B knee OA, knee pain  X-rays: AP Bilateral Weight-bearing, Weightbearing Lateral, Sunrise views Indication: knee pain Findings: Moderate medial compartmental changes with superior patellar osteophyte on the R with minimal OA changes on the L. Reviewed in the office with patient. 30 pound weight loss.  Sinusitis: maxillary pain > 1 week Some runny nose, colored.  Minimal cough. No sore throat. No earache  Synvisc-3 for R knee.  Patient Active Problem List  Diagnoses  . DIABETES MELLITUS  . POLYCYSTIC OVARIAN DISEASE  . VITAMIN B12 DEFICIENCY  . VITAMIN D DEFICIENCY  . HYPERLIPIDEMIA  . OBESITY, MORBID  . ANXIETY  . PANIC DISORDER  . BULIMIA  . HEADACHE, TENSION  . DEPRESSION  . CARPAL TUNNEL SYNDROME  . LYMPHEDEMA  . ALLERGIC RHINITIS  . ASTHMA, MILD, INTERMITTENT  . TMJ SYNDROME  . GERD  . IRRITABLE BOWEL SYNDROME  . PROCTITIS  . HOT FLASHES  . INTERTRIGO, CANDIDAL  . SKIN TAG  . SEBORRHEIC KERATOSIS, INFLAMED  . OSTEOARTHRITIS  . FIBROMYALGIA  . CHRONIC FATIGUE SYNDROME  . HYPERHIDROSIS  . COUGH, CHRONIC  . DIARRHEA, ACUTE, CHRONIC  . DIVERTICULITIS, HX OF  . Right knee pain  . UTI (lower urinary tract infection)  . Acute sinusitis  . Vaginal bleeding  . Hematuria   Past Medical History  Diagnosis Date  . Allergy     allergic rhinitis  . Anxiety   . Depression   . Hyperlipidemia   . Arthritis     osteoarthritis  . Obesity   . Lymphedema   . Tachycardia   . Neuromuscular disorder     fibromyalgia  . Hyperhidrosis   . GERD (gastroesophageal reflux disease)     EGD negative 06/2001// EGD erythematous mucosa, polyp 08/2008  . Fatty liver 07/2008    abd. ultrasound - fatty liver ; slt dilated cbd (no stones ) 06/10// abd.  ultrasound normal on 04/2006   Past Surgical History  Procedure Date  . Tonsillectomy   . Septoplasty   . Foot  surgery   . Uterine tumor 09/2001  . Vagus nerve stimulator insertion   . Endometrial biopsy 01/2004  . Uterine fibroid surgery 06/2005    ablation   History  Substance Use Topics  . Smoking status: Never Smoker   . Smokeless tobacco: Not on file  . Alcohol Use: No   Family History  Problem Relation Age of Onset  . Hypertension Father   . Cancer Father     pancreatic cancer  . Hypertension Sister   . Heart disease Maternal Grandmother 60    MI  . Heart disease Paternal Grandmother 98    MI   Allergies  Allergen Reactions  . Cephalexin   . Codeine     REACTION: ? reaction  . Duloxetine   . Hydrocodone     REACTION: ? reaction  . Norelgestromin-Eth Estradiol   . Prednisone     REACTION: ? reaction  . Quetiapine   . Sertraline Hcl     REACTION: vivid dreams  . Triazolam     REACTION: ? reaction  . Zaleplon   . Zocor (Simvastatin - High Dose)     achey  . Zolpidem Tartrate     REACTION: ? reaction   Current Outpatient Prescriptions on File Prior to Visit  Medication Sig Dispense Refill  . acetaminophen (TYLENOL) 500 MG tablet Take 500  mg by mouth every 6 (six) hours as needed. OTC as directed       . ALPRAZolam (XANAX) 1 MG tablet Take 1 mg by mouth at bedtime as needed. Two tablets at bedtime and as needed during the day       . aluminum chloride (DRYSOL) 20 % external solution Apply topically at bedtime. Apply to affected areas at bedtime - then wash off in the AM once sweating improves - can use twice weekly       . benzonatate (TESSALON) 100 MG capsule Take 100 mg by mouth 3 (three) times daily as needed. 1-2 by mouth up to three times a day as needed       . Cholecalciferol (VITAMIN D) 1000 UNITS capsule Take 1,000 Units by mouth daily.        . CloNIDine HCl 0.1 MG TB12 Take by mouth 2 (two) times daily. Take 1 tablet by mouth twice a day       . clotrimazole-betamethasone (LOTRISONE) cream Apply topically 2 (two) times daily.  15 g  0  . cyanocobalamin  (,VITAMIN B-12,) 1000 MCG/ML injection Inject 1,000 mcg into the muscle once. 1 ml (1000 mcg) IM as directed every month       . cyclobenzaprine (FLEXERIL) 5 MG tablet TAKE ONE-HALF TO ONE TABLET THREE       TIMES A DAY AS NEEDED FOR NECK PAIN     AND SPASM  30 tablet  1  . desoximetasone (TOPICORT) 0.25 % cream Apply topically. Apply as directed       . drospirenone-ethinyl estradiol (YASMIN) 3-0.03 MG per tablet Take 1 tablet by mouth daily. Continuous dosing take by mouth       . FLUoxetine (PROZAC) 40 MG capsule Take 80 mg by mouth daily.       Marland Kitchen guaifenesin (REFENESEN 400) 400 MG TABS Take 400 mg by mouth every 4 (four) hours as needed. OTC as directed       . ketoconazole (NIZORAL) 2 % cream Apply topically daily. Apply to affected area once daily for 2 weeks       . lidocaine (LIDODERM) 5 % Place 1 patch onto the skin daily. Remove & Discard patch within 12 hours or as directed by MD       . metFORMIN (GLUCOPHAGE) 500 MG tablet Take 1 tablet (500 mg total) by mouth 2 (two) times daily with a meal.  60 tablet  11  . naphazoline-glycerin (CLEAR EYES) 0.012-0.2 % SOLN Place 1-2 drops into both eyes every 4 (four) hours as needed. OTC as directed       . nystatin (NYSTOP) 100000 UNIT/GM POWD Apply topically 2 (two) times daily as needed. Apply to affected area two times a day until clear asneeded       . omeprazole (PRILOSEC) 40 MG capsule Take 40 mg by mouth 2 (two) times daily.        Marland Kitchen spironolactone (ALDACTONE) 50 MG tablet Take 50 mg by mouth daily.        . TraMADol HCl 50 MG TBSO Take by mouth every 6 (six) hours as needed. 1 every 6 hours as needed for pain; may combine with 1 tylenol 500 mg by mouth       . traZODone (DESYREL) 150 MG tablet Take 150 mg by mouth at bedtime. 2 to 3 tablets by mouth at bedtime      . traZODone (DESYREL) 50 MG tablet Take 6 tablets  By mouth at bedtime. But  can take up to 9 tablets daily        ROS: GEN: Acute illness details above GI: Tolerating PO  intake GU: maintaining adequate hydration and urination Pulm: No SOB Interactive and getting along well at home.  Otherwise, ROS is as per the HPI.   Physical Exam  Blood pressure 130/70, pulse 110, temperature 98.3 F (36.8 C), temperature source Oral, height 5\' 4"  (1.626 m), weight 343 lb 1.9 oz (155.638 kg), SpO2 96.00%.  Gen: WDWN, NAD; alert,appropriate and cooperative throughout exam  HEENT: Normocephalic and atraumatic. Throat clear, w/o exudate, no LAD, R TM clear, L TM - good landmarks, No fluid present. rhinnorhea.  Left frontal and maxillary sinuses: Tender max Right frontal and maxillary sinuses: Tender max  Neck: No ant or post LAD CV: RRR, No M/G/R Pulm: Breathing comfortably in no resp distress. no w/c/r MSK: R knee joint line medially TTP, crepitus Abd: S,NT,ND,+BS Extr: no c/c/e Psych: full affect, pleasant  Assessment and Plan: 1.  Acute sinusitis:  ABX as below.  Refer to the patient instructions sections for details of plan shared with patient.  Reviewed symptomatic care as well as ABX in this case.   2. OA: reviewed supplements and answered all questions. Emphasized weight loss.  Knee Injection: Synvisc-3, 2 mL, R knee Patient verbally consented to procedure. Risks, benefits, and alternatives explained. Sterilely prepped with betadine. Ethyl cholride used for anesthesia, then 5 cc of Lidocaine 1% used for anesthesia in the anterolateral position. Reprepped with Betadine.  Anterolateral approach without difficulty, injected with Synvisc-3, 2 mL. No complications with procedure and tolerated well.

## 2010-09-12 ENCOUNTER — Telehealth: Payer: Self-pay | Admitting: *Deleted

## 2010-09-12 ENCOUNTER — Emergency Department: Payer: Self-pay | Admitting: Internal Medicine

## 2010-09-12 NOTE — Telephone Encounter (Signed)
Patient says that she is having a lot of pain in her lower back, still having just a little pressure when she urinates. She says that the urologist can't see her until the 8-15 and she is having blood in her urine again. She is concerned about waiting until the 15 th in case she has possible infection. She is currently on a zpak for a sinus infection so is wondering if that would help with bladder infection.  She is asking if you could call her at your convenience. She says that you always calm her down.

## 2010-09-12 NOTE — Telephone Encounter (Signed)
Patient walked into the office crying and in pain. Patient was having difficulty breathing because she was so upset, which we were able to get her calmed down. After speaking with Dr. Milinda Antis patient was advised that she needs to go to the ER because she would need test and treatment that we would not be able to give her here at the office. Patient called her sister to come to the office to take her to the ER.  Patient stated that she will go to [facility] for treatment.

## 2010-09-12 NOTE — Telephone Encounter (Signed)
I was told that she may have a kidney stone - will need CT or IVP- so advised ER so she can be evaluated in a timely fashion Of note - our phones are not working today Will wait for records from the ER

## 2010-09-14 ENCOUNTER — Ambulatory Visit: Payer: Self-pay | Admitting: Urology

## 2010-09-18 ENCOUNTER — Ambulatory Visit (INDEPENDENT_AMBULATORY_CARE_PROVIDER_SITE_OTHER): Payer: Medicare Other | Admitting: Family Medicine

## 2010-09-18 ENCOUNTER — Encounter: Payer: Self-pay | Admitting: Family Medicine

## 2010-09-18 DIAGNOSIS — M1990 Unspecified osteoarthritis, unspecified site: Secondary | ICD-10-CM

## 2010-09-18 NOTE — Progress Notes (Signed)
  Subjective:    Patient ID: Mackenzie Bonilla, female    DOB: [birth year], 45 y.o.   MRN: [id number]  HPI    Review of Systems     Objective:   Physical Exam        Assessment & Plan:  OA, R: Synvisc Injection #2  Knee Injection: [id number] Patient verbally consented to procedure. Risks, benefits, and alternatives explained. Sterilely prepped with betadine. Ethyl cholride used for anesthesia, then 5 cc of Lidocaine 1% used for anesthesia in the anterolateral position. Reprepped with Betadine.  Anteromedial approach without difficulty, injected with Synvisc-3, 2 mL. No complications with procedure and tolerated well.

## 2010-09-18 NOTE — Patient Instructions (Signed)
REFERRAL: GO THE THE FRONT ROOM AT THE ENTRANCE OF OUR CLINIC, NEAR CHECK IN. ASK FOR Mackenzie Bonilla. SHE WILL HELP YOU SET UP YOUR REFERRAL. DATE: TIME:  

## 2010-09-25 ENCOUNTER — Telehealth: Payer: Self-pay | Admitting: *Deleted

## 2010-09-25 ENCOUNTER — Ambulatory Visit (INDEPENDENT_AMBULATORY_CARE_PROVIDER_SITE_OTHER): Payer: Medicare Other | Admitting: Family Medicine

## 2010-09-25 ENCOUNTER — Encounter: Payer: Self-pay | Admitting: Family Medicine

## 2010-09-25 DIAGNOSIS — M25561 Pain in right knee: Secondary | ICD-10-CM

## 2010-09-25 DIAGNOSIS — M25569 Pain in unspecified knee: Secondary | ICD-10-CM

## 2010-09-25 DIAGNOSIS — M1990 Unspecified osteoarthritis, unspecified site: Secondary | ICD-10-CM

## 2010-09-25 NOTE — Telephone Encounter (Signed)
Patient notified as instructed by telephone. Pt said she will reschedule lab and f/u appt with Dr Milinda Antis end of Sept. Pt wonders if should have other lab test like cholesterol and liver function etc. Next lab draw. Please advise..Pt already cancelled August appts.

## 2010-09-25 NOTE — Telephone Encounter (Signed)
Patient notified as instructed by telephone. 

## 2010-09-25 NOTE — Progress Notes (Signed)
  Subjective:    Patient ID: Mackenzie Bonilla, female    DOB: 03-01-1965, 45 y.o.   MRN: [id number]  Knee Pain       Review of Systems      Objective:   Physical Exam         Assessment & Plan:  OA, R: Synvisc Injection #3 in series.  Knee Injection: [id number] Patient verbally consented to procedure. Risks, benefits, and alternatives explained. Sterilely prepped with betadine. Ethyl cholride used for anesthesia, then 5 cc of Lidocaine 1% used for anesthesia in the anterolateral position. Reprepped with Betadine.  Anteromedial approach without difficulty, injected with Synvisc-3, 2 mL. No complications with procedure and tolerated well.

## 2010-09-25 NOTE — Telephone Encounter (Signed)
Patient is scheduled for a1c next week wants to know if she should reschedule this b/c she has had and lot of stress and pain medication lately. She doesn't want a false reading because she has been trying so hard

## 2010-09-25 NOTE — Telephone Encounter (Signed)
Pt having dry mouth,fatigue,nausea and abdominal pain. Pt is concerned about ketoacidosis and wonders if should be tested. Pt is looking on the internet and now she states she is just confused. Pt has appt with urologist on Wed.09/27/10 and she will check with them. If pt does not feel satisfied after seeing urologist pt will call back to Dr Milinda Antis.

## 2010-09-25 NOTE — Telephone Encounter (Signed)
The a1c reflects 3 months of sugars - so let her know this and re sched for whenever she wants to do it

## 2010-09-25 NOTE — Telephone Encounter (Signed)
Left v/m on pt's home and cell for pt to call back.

## 2010-09-25 NOTE — Telephone Encounter (Signed)
Ok - follow up with me after urologist if needed

## 2010-10-02 ENCOUNTER — Other Ambulatory Visit: Payer: Medicare Other

## 2010-10-06 LAB — US OB TRANSVAGINAL

## 2010-10-09 ENCOUNTER — Ambulatory Visit: Payer: Medicare Other | Admitting: Family Medicine

## 2010-10-10 ENCOUNTER — Ambulatory Visit: Payer: Medicare Other | Admitting: Family Medicine

## 2010-11-08 LAB — TROPONIN I: Troponin I: <0.01

## 2011-04-16 ENCOUNTER — Emergency Department: Payer: Self-pay

## 2011-04-16 ENCOUNTER — Ambulatory Visit: Payer: Medicare Other | Admitting: Family Medicine

## 2011-04-16 DIAGNOSIS — Z0289 Encounter for other administrative examinations: Secondary | ICD-10-CM

## 2011-04-16 LAB — CBC
HCT: 39.8 % (ref 35.0–47.0)
HGB: 13.2 g/dL (ref 12.0–16.0)
MCHC: 33.2 g/dL (ref 32.0–36.0)
Platelet: 245 10*3/uL (ref 150–440)
WBC: 6.7 10*3/uL (ref 3.6–11.0)

## 2011-04-16 LAB — URINALYSIS, COMPLETE
Bacteria: NONE SEEN
Bilirubin,UR: NEGATIVE
Glucose,UR: NEGATIVE mg/dL (ref 0–75)
Glucose,UR: NEGATIVE mg/dL (ref 0–75)
Leukocyte Esterase: NEGATIVE
Leukocyte Esterase: NEGATIVE
Nitrite: NEGATIVE
Nitrite: NEGATIVE
Ph: 5 (ref 4.5–8.0)
Ph: 5 (ref 4.5–8.0)
Protein: 100
Protein: 100
RBC,UR: 142 /HPF (ref 0–5)
RBC,UR: 556 /HPF (ref 0–5)
Specific Gravity: 1.03 (ref 1.003–1.030)
Specific Gravity: 1.029 (ref 1.003–1.030)
WBC UR: 15 /HPF (ref 0–5)

## 2011-04-16 LAB — COMPREHENSIVE METABOLIC PANEL
Alkaline Phosphatase: 52 U/L (ref 50–136)
Calcium, Total: 8.6 mg/dL (ref 8.5–10.1)
Co2: 25 mmol/L (ref 21–32)
EGFR (Non-African Amer.): >60
SGOT(AST): 19 U/L (ref 15–37)
SGPT (ALT): 20 U/L

## 2011-04-16 LAB — WET PREP, GENITAL

## 2011-04-25 ENCOUNTER — Ambulatory Visit (INDEPENDENT_AMBULATORY_CARE_PROVIDER_SITE_OTHER): Payer: Medicare Other | Admitting: Family Medicine

## 2011-04-25 ENCOUNTER — Encounter: Payer: Self-pay | Admitting: Family Medicine

## 2011-04-25 DIAGNOSIS — R319 Hematuria, unspecified: Secondary | ICD-10-CM

## 2011-04-25 DIAGNOSIS — F410 Panic disorder [episodic paroxysmal anxiety] without agoraphobia: Secondary | ICD-10-CM

## 2011-04-25 LAB — POCT URINALYSIS DIPSTICK
Glucose, UA: NEGATIVE
Ketones, UA: NEGATIVE
Nitrite, UA: NEGATIVE
Spec Grav, UA: >=1.03
pH, UA: 7.5

## 2011-04-25 LAB — POCT UA - MICROSCOPIC ONLY: WBC, Ur, HPF, POC: 0

## 2011-04-25 MED ORDER — CYCLOBENZAPRINE HCL 5 MG PO TABS: 5.0000 mg | ORAL_TABLET | Freq: Three times a day (TID) | ORAL | Status: DC | PRN

## 2011-04-25 MED ORDER — TRAMADOL HCL 50 MG PO TABS: 50.0000 mg | ORAL_TABLET | Freq: Four times a day (QID) | ORAL | Status: AC | PRN

## 2011-04-25 NOTE — Patient Instructions (Signed)
Do your best to try and give a specimen Exam today is reassuring Follow up with Dr Achilles Dunk as planned  Drink more water- dehydration puts a stress on kidneys and bladder (if you are nauseated at night than drink more during the day )  Schedule annual exam with labs prior late spring / early summer
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
## 2011-04-25 NOTE — Assessment & Plan Note (Addendum)
Per pt at first thought this was vaginal - but has had several vaginal exams (nl ) incl one in ER recently  Rev ER notes and labs and studies with pt in detail incl [place] Also cath urine with rbc cx neg  Repeat urine today Cannot r/o kidney stone - pt to see Dr Achilles Dunk soon (worried she cannot do CT due to $$) Rev ultrasound that was reassuring  L flank pain is diminished now
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
## 2011-04-28 LAB — URINE CULTURE: Organism ID, Bacteria: NO GROWTH

## 2011-06-05 ENCOUNTER — Emergency Department: Payer: Self-pay | Admitting: Emergency Medicine

## 2011-06-05 LAB — CBC
MCH: 30.8 pg (ref 26.0–34.0)
MCHC: 32.1 g/dL (ref 32.0–36.0)
MCV: 96 fL (ref 80–100)
Platelet: 250 10*3/uL (ref 150–440)
RBC: 4.41 10*6/uL (ref 3.80–5.20)
WBC: 11.3 10*3/uL — ABNORMAL HIGH (ref 3.6–11.0)

## 2011-06-05 LAB — COMPREHENSIVE METABOLIC PANEL
Alkaline Phosphatase: 51 U/L (ref 50–136)
Anion Gap: 15 (ref 7–16)
BUN: 13 mg/dL (ref 7–18)
Bilirubin,Total: 0.2 mg/dL (ref 0.2–1.0)
Calcium, Total: 9 mg/dL (ref 8.5–10.1)
Chloride: 106 mmol/L (ref 98–107)
Co2: 21 mmol/L (ref 21–32)
Creatinine: 1.13 mg/dL (ref 0.60–1.30)
EGFR (African American): >60
EGFR (Non-African Amer.): 59 — ABNORMAL LOW
SGOT(AST): 22 U/L (ref 15–37)
SGPT (ALT): 17 U/L
Total Protein: 6.9 g/dL (ref 6.4–8.2)

## 2011-06-06 ENCOUNTER — Telehealth: Payer: Self-pay | Admitting: Family Medicine

## 2011-06-06 LAB — URINALYSIS, COMPLETE
Bilirubin,UR: NEGATIVE
Ph: 5 (ref 4.5–8.0)
RBC,UR: 250 /HPF (ref 0–5)
Specific Gravity: 1.035 (ref 1.003–1.030)
Squamous Epithelial: 8
WBC UR: 22 /HPF (ref 0–5)

## 2011-06-06 NOTE — Telephone Encounter (Signed)
If no appts available she needs to go to UC or ER and get checked out --  If could be kidney stone

## 2011-06-06 NOTE — Telephone Encounter (Signed)
Spoke with patient via telephone, she was seen in the ER last night [facility]) and she did have a kidney stone.  She will call to schedule follow up when she feels better.

## 2011-06-06 NOTE — Telephone Encounter (Signed)
Triage Record Num: [id number] Operator: Albertine Grates Patient Name: Mackenzie Bonilla Call Date & Time: 06/05/2011 8:48:27PM Patient Phone: [phone] PCP: Audrie Gallus. Tower Patient Gender: Female PCP Fax : Patient DOB: [birth year] Practice Name: [facility] Reason for Call: Caller: [name]/Patient; PCP: [name]; CB#: [phone]; Call regarding Side pain radiating to back, vomiting, pain pill did not help ; Has pain in flank since 4-23. Took Tramadol but did not help so took Percocet and has helped "take the edge off". Has history of kidney stones and has had similar symptoms. Has vomited due to pain. Afebrile. Pain has been happening frequently and will ease off for few minutes. Advised ED. Protocol(s) Used: Flank Pain Recommended Outcome per Protocol: See Provider within 4 hours Reason for Outcome: Sudden onset of flank pain AND NOT relieved by aspirin, acetaminophen, or nonsteroidal anti-inflammatory agents Care Advice: ~ 06/05/2011 8:59:13PM Page 1 of 1 [email or web address]

## 2011-06-06 NOTE — Telephone Encounter (Signed)
Thanks for the update

## 2011-06-19 ENCOUNTER — Encounter: Payer: Self-pay | Admitting: Family Medicine

## 2011-06-19 ENCOUNTER — Telehealth: Payer: Self-pay

## 2011-06-19 ENCOUNTER — Ambulatory Visit (INDEPENDENT_AMBULATORY_CARE_PROVIDER_SITE_OTHER): Payer: Medicare Other | Admitting: Family Medicine

## 2011-06-19 VITALS — BP 124/86 | HR 84 | Temp 98.2°F | Ht 64.0 in | Wt 358.8 lb

## 2011-06-19 DIAGNOSIS — N939 Abnormal uterine and vaginal bleeding, unspecified: Secondary | ICD-10-CM

## 2011-06-19 DIAGNOSIS — R300 Dysuria: Secondary | ICD-10-CM

## 2011-06-19 DIAGNOSIS — N898 Other specified noninflammatory disorders of vagina: Secondary | ICD-10-CM

## 2011-06-19 LAB — POCT URINALYSIS DIPSTICK
Bilirubin, UA: NEGATIVE
Glucose, UA: NEGATIVE
Ketones, UA: POSITIVE
Leukocytes, UA: NEGATIVE
Nitrite, UA: NEGATIVE
Protein, UA: 30
Spec Grav, UA: 1.025
Urobilinogen, UA: 0.2
pH, UA: 5

## 2011-06-19 NOTE — Telephone Encounter (Signed)
Patient saw Dr. Milinda Antis today.

## 2011-06-19 NOTE — Patient Instructions (Signed)
It appears that your bleeding is vaginal  Go ahead and call to get your appt with Dr Kym Groom also need to drink more water

## 2011-06-19 NOTE — Assessment & Plan Note (Addendum)
In morbidly obese pt with remote hx of endometrial ablation/ neg endom bx/ and stable pelvic US in 8/12 Sees Dr Vincente Poli and will make f/u appt  No cramping or other symptoms Disc the significance of obesity in terms of endometrial risks today - she voiced understanding  Did carefully review ER and urology records we have today with patient

## 2011-06-19 NOTE — Telephone Encounter (Signed)
Pt seen [facility] 2 weeks ago with kidney stone and UTI. Pt not straining urine so not sure if passed kidney stone. Pt finished Cipro for UTI. Pt has low back pain in middle of back; blood on tissue when wipes and blood in panty started this AM. Pt said Dr Milinda Antis wanted to see pt next time she was actively bleeding. Pt not sure if vaginal bleeding or not. Pt said pain level 2. Pt to see Dr Milinda Antis today at 3 pm.

## 2011-06-19 NOTE — Progress Notes (Signed)
Subjective:    Patient ID: Mackenzie Bonilla, female    DOB: 1965-03-16, 46 y.o.   MRN: [id number]  HPI Here for bleeding  Blood when she wipes -- is bright red  Is on the toilet paper - just enough to tint the urine     Sees Dr Achilles Dunk for urology Last renal US did not show stones  ucx 3/14 was negative   Then had a kidney stone since then -- was 2 weeks ago - had to go to ER at [facility] Started vomiting and would not stop  R sided severe pain  Did a CT and xray and [place] -- Kidney stone on the R  ? If it passed on it's own  Also said she had a uti  She was told to f/u with Dr Achilles Dunk - but she called and they told her not to come in if her pain was better   Today not really having pain  Not in abd or back   Lab Results  Component Value Date   CALCIUM 9.9 06/17/2009   PHOS 4.0 03/07/2009     Last gyn [place] was 8/12 and wa normal also   Wt is down 9 lb bmi of 61  Patient Active Problem List  Diagnoses  . DIABETES MELLITUS  . POLYCYSTIC OVARIAN DISEASE  . VITAMIN B12 DEFICIENCY  . VITAMIN D DEFICIENCY  . HYPERLIPIDEMIA  . OBESITY, MORBID  . ANXIETY  . PANIC DISORDER  . BULIMIA  . HEADACHE, TENSION  . DEPRESSION  . CARPAL TUNNEL SYNDROME  . LYMPHEDEMA  . ALLERGIC RHINITIS  . ASTHMA, MILD, INTERMITTENT  . TMJ SYNDROME  . GERD  . IRRITABLE BOWEL SYNDROME  . PROCTITIS  . HOT FLASHES  . INTERTRIGO, CANDIDAL  . SKIN TAG  . SEBORRHEIC KERATOSIS, INFLAMED  . OSTEOARTHRITIS  . FIBROMYALGIA  . CHRONIC FATIGUE SYNDROME  . HYPERHIDROSIS  . COUGH, CHRONIC  . DIARRHEA, ACUTE, CHRONIC  . DIVERTICULITIS, HX OF  . Right knee pain  . UTI (lower urinary tract infection)  . Acute sinusitis  . Vaginal bleeding  . Hematuria   Past Medical History  Diagnosis Date  . Allergy     allergic rhinitis  . Anxiety   . Depression   . Hyperlipidemia   . Arthritis     osteoarthritis  . Obesity   . Lymphedema   . Tachycardia   . Neuromuscular disorder     fibromyalgia  .  Hyperhidrosis   . GERD (gastroesophageal reflux disease)     EGD negative 06/2001// EGD erythematous mucosa, polyp 08/2008  . Fatty liver 07/2008    abd. ultrasound - fatty liver ; slt dilated cbd (no stones ) 06/10// abd.  ultrasound normal on 04/2006   Past Surgical History  Procedure Date  . Tonsillectomy   . Septoplasty   . Foot surgery   . Uterine tumor 09/2001  . Vagus nerve stimulator insertion   . Endometrial biopsy 01/2004  . Uterine fibroid surgery 06/2005    ablation   History  Substance Use Topics  . Smoking status: Never Smoker   . Smokeless tobacco: Not on file  . Alcohol Use: No   Family History  Problem Relation Age of Onset  . Hypertension Father   . Cancer Father     pancreatic cancer  . Hypertension Sister   . Heart disease Maternal Grandmother 60    MI  . Heart disease Paternal Grandmother 17    MI   Allergies  Allergen  Reactions  . Cephalexin   . Codeine     REACTION: ? reaction  . Duloxetine   . Hydrocodone     REACTION: ? reaction  . Norelgestromin-Eth Estradiol   . Prednisone     REACTION: ? reaction  . Quetiapine   . Sertraline Hcl     REACTION: vivid dreams  . Triazolam     REACTION: ? reaction  . Zaleplon   . Zocor (Simvastatin - High Dose)     achey  . Zolpidem Tartrate     REACTION: ? reaction   Current Outpatient Prescriptions on File Prior to Visit  Medication Sig Dispense Refill  . acetaminophen (TYLENOL) 500 MG tablet Take 500 mg by mouth every 6 (six) hours as needed. OTC as directed       . ALPRAZolam (XANAX) 1 MG tablet Take 2 mg by mouth 3 (three) times daily as needed.       Marland Kitchen aluminum chloride (DRYSOL) 20 % external solution Apply topically at bedtime. Apply to affected areas at bedtime - then wash off in the AM once sweating improves - can use twice weekly       . azithromycin (ZITHROMAX) 250 MG tablet 2 tabs po on day 1 then 1 tab po for 4 days   6 each  0  . benzonatate (TESSALON) 100 MG capsule Take 100 mg by  mouth 3 (three) times daily as needed. 1-2 by mouth up to three times a day as needed       . Cholecalciferol (VITAMIN D) 1000 UNITS capsule Take 1,000 Units by mouth daily.        . cloNIDine (CATAPRES) 0.1 MG tablet       . CloNIDine HCl 0.1 MG TB12 Take by mouth 2 (two) times daily. Take 1 tablet by mouth twice a day       . clotrimazole-betamethasone (LOTRISONE) cream Apply topically 2 (two) times daily.  15 g  0  . cyanocobalamin (,VITAMIN B-12,) 1000 MCG/ML injection Inject 1,000 mcg into the muscle once. 1 ml (1000 mcg) IM as directed every month       . cyclobenzaprine (FLEXERIL) 5 MG tablet Take 1 tablet (5 mg total) by mouth 3 (three) times daily as needed for muscle spasms.  30 tablet  1  . desoximetasone (TOPICORT) 0.25 % cream Apply topically. Apply as directed       . drospirenone-ethinyl estradiol (YASMIN) 3-0.03 MG per tablet Take 1 tablet by mouth daily. Continuous dosing take by mouth       . FLUoxetine (PROZAC) 40 MG capsule Take 80 mg by mouth daily.       Marland Kitchen guaifenesin (REFENESEN 400) 400 MG TABS Take 400 mg by mouth every 4 (four) hours as needed. OTC as directed       . ketoconazole (NIZORAL) 2 % cream Apply topically daily. Apply to affected area once daily for 2 weeks       . lidocaine (LIDODERM) 5 % Place 1 patch onto the skin daily. Remove & Discard patch within 12 hours or as directed by MD       . metFORMIN (GLUCOPHAGE) 500 MG tablet Take 1 tablet (500 mg total) by mouth 2 (two) times daily with a meal.  60 tablet  11  . naphazoline-glycerin (CLEAR EYES) 0.012-0.2 % SOLN Place 1-2 drops into both eyes every 4 (four) hours as needed. OTC as directed       . nystatin (NYSTOP) 100000 UNIT/GM POWD Apply topically 2 (two)  times daily as needed. Apply to affected area two times a day until clear asneeded       . omeprazole (PRILOSEC) 40 MG capsule Take 40 mg by mouth 2 (two) times daily.        Marland Kitchen spironolactone (ALDACTONE) 50 MG tablet Take 50 mg by mouth daily.        .  traZODone (DESYREL) 150 MG tablet Take 150 mg by mouth at bedtime. 2 to 3 tablets by mouth at bedtime      . traZODone (DESYREL) 50 MG tablet Take 6 tablets  By mouth at bedtime. But can take up to 9 tablets daily           Review of Systems Review of Systems  Constitutional: Negative for fever, appetite change  Eyes: Negative for pain and visual disturbance.  Respiratory: Negative for cough and shortness of breath.   Cardiovascular: Negative for cp or palpitations    Gastrointestinal: Negative for, diarrhea and constipation. pos for chronic nausea Genitourinary: Negative for urgency and frequency. pos for blood in urine/ neg for flank pain  Skin: Negative for pallor or rash   Neurological: Negative for weakness, light-headedness, numbness and headaches.  Hematological: Negative for adenopathy. Does not bruise/bleed easily.  Psychiatric/Behavioral: pos for depression and anxiety        Objective:   Physical Exam  Constitutional: She appears well-developed and well-nourished. No distress.       mobidly obese and anxious  HENT:  Head: Normocephalic and atraumatic.  Mouth/Throat: Oropharynx is clear and moist.  Eyes: Conjunctivae and EOM are normal. Pupils are equal, round, and reactive to light. No scleral icterus.  Neck: Normal range of motion. Neck supple. No JVD present.  Cardiovascular: Normal rate and regular rhythm.   Pulmonary/Chest: Effort normal and breath sounds normal.  Abdominal: Soft. Bowel sounds are normal. She exhibits no distension and no mass. There is no tenderness. There is no rebound and no guarding.       No suprapubic tenderness    Genitourinary:       Vaginal bleeding noted on speculum exam- appears to be coming from cervix  Mild tenderness on exam  No lesions or vaginal tears noted   Musculoskeletal:       No cva tenderness   Lymphadenopathy:    She has no cervical adenopathy.  Neurological: She is alert.  Skin: Skin is warm and dry. No pallor.    Psychiatric:       anxious          Assessment & Plan:

## 2011-06-21 ENCOUNTER — Telehealth: Payer: Self-pay

## 2011-06-21 NOTE — Telephone Encounter (Signed)
Pt left v/m requesting copy of 06/19/11 visit sent to Dr Vincente Poli. Pt has appt to see Dr Vincente Poli next week.Pt can be reached at [phone].

## 2011-06-21 NOTE — Telephone Encounter (Signed)
Please let me know when it's finished and I will send it to Dr. Vincente Poli.

## 2011-06-24 NOTE — Telephone Encounter (Signed)
Not done-please fax it , thanks

## 2011-06-25 NOTE — Telephone Encounter (Signed)
Note faxed to Dr. Vincente Poli today.

## 2011-06-28 ENCOUNTER — Other Ambulatory Visit: Payer: Self-pay | Admitting: Obstetrics and Gynecology

## 2011-07-04 ENCOUNTER — Other Ambulatory Visit: Payer: Self-pay | Admitting: *Deleted

## 2011-07-04 MED ORDER — OMEPRAZOLE 40 MG PO CPDR: 40.0000 mg | DELAYED_RELEASE_CAPSULE | Freq: Two times a day (BID) | ORAL | Status: DC

## 2011-10-19 ENCOUNTER — Telehealth: Payer: Self-pay

## 2011-10-19 NOTE — Telephone Encounter (Signed)
Pt said [facility] pharmacy has been requesting refill on allopurinol; not on med list and no request noted from pharmacy. Spoke with Amy at [facility] and request has gone to Dr Achilles Dunk who refills that med. Pt notified while on phone.

## 2011-10-19 NOTE — Telephone Encounter (Signed)
Request for Allopurinol 100 mg #30. Ok to refill? Did not see the medication on med list.

## 2011-11-14 ENCOUNTER — Ambulatory Visit (INDEPENDENT_AMBULATORY_CARE_PROVIDER_SITE_OTHER): Payer: Medicare Other | Admitting: Family Medicine

## 2011-11-14 ENCOUNTER — Encounter: Payer: Self-pay | Admitting: Family Medicine

## 2011-11-14 VITALS — BP 138/80 | HR 76 | Temp 98.1°F | Ht 64.0 in | Wt 369.2 lb

## 2011-11-14 DIAGNOSIS — E559 Vitamin D deficiency, unspecified: Secondary | ICD-10-CM

## 2011-11-14 DIAGNOSIS — E538 Deficiency of other specified B group vitamins: Secondary | ICD-10-CM

## 2011-11-14 DIAGNOSIS — E785 Hyperlipidemia, unspecified: Secondary | ICD-10-CM

## 2011-11-14 DIAGNOSIS — E119 Type 2 diabetes mellitus without complications: Secondary | ICD-10-CM

## 2011-11-14 LAB — COMPREHENSIVE METABOLIC PANEL
ALT: 25 U/L (ref 0–35)
CO2: 23 mEq/L (ref 19–32)
Calcium: 9.1 mg/dL (ref 8.4–10.5)
Chloride: 103 mEq/L (ref 96–112)
Creatinine, Ser: 0.7 mg/dL (ref 0.4–1.2)
GFR: 94.06 mL/min (ref >60.00)
Glucose, Bld: 153 mg/dL — ABNORMAL HIGH (ref 70–99)
Total Bilirubin: 0.5 mg/dL (ref 0.3–1.2)
Total Protein: 6.6 g/dL (ref 6.0–8.3)

## 2011-11-14 LAB — VITAMIN B12: Vitamin B-12: 327 pg/mL (ref 211–911)

## 2011-11-14 LAB — CBC WITH DIFFERENTIAL/PLATELET
Basophils Absolute: 0 10*3/uL (ref 0.0–0.1)
Eosinophils Relative: 2 % (ref 0.0–5.0)
HCT: 40.8 % (ref 36.0–46.0)
Hemoglobin: 13.3 g/dL (ref 12.0–15.0)
Lymphocytes Relative: 22.4 % (ref 12.0–46.0)
Lymphs Abs: 1.5 10*3/uL (ref 0.7–4.0)
Monocytes Relative: 6 % (ref 3.0–12.0)
Neutro Abs: 4.7 10*3/uL (ref 1.4–7.7)
RBC: 4.25 Mil/uL (ref 3.87–5.11)
RDW: 14.2 % (ref 11.5–14.6)
WBC: 6.8 10*3/uL (ref 4.5–10.5)

## 2011-11-14 LAB — TSH: TSH: 4.48 u[IU]/mL (ref 0.35–5.50)

## 2011-11-14 NOTE — Patient Instructions (Addendum)
I'm glad you got your flu shot  Labs today  I may refer you again to diabetic teaching if it is appropriate Please try to eliminate simple sugars from your diet Stay as active as you can Follow up with me in 3 months

## 2011-11-14 NOTE — Progress Notes (Signed)
Subjective:    Patient ID: Mackenzie Bonilla, female    DOB: July 23, 1965, 46 y.o.   MRN: [id number]  HPI Here for disc about DM  Wt is up 11 lb with bmi of 63  Had her flu shot  Diabetes Does not check her sugars at home  DM diet - the same - eats what she can afford  Also has to watch diet for uric acid (per urol) Has cut way back on tea, drinks only diet drinks 2-3 times a month, and drinks tons of water  Exercise -not a lot - is morbidly obese and cannot do much  Symptoms - urinates a lot - but has overactive bladder (takes oxybutinin), but no thirst  A1C last  Lab Results  Component Value Date   HGBA1C 7.4* 06/23/2010    Was on metformin and did not tolerate it  Renal protection Last eye exam - oct of last year   She has called about DM teaching - needs formal dx of diabetic    bkfast - frozen pancakes , light syrup Or sandwhich - with low fat meat progresso soup  Dinner- soup or another sandwhich or lean quisine  Does eat sweats  Longest she has gone without is 3-4 days Gets a headache if she does not eat it     Depression is worse lately- she sees psychiatry  Lots of stress  Starting wellbutrin-that dec her appetite a bit   Patient Active Problem List  Diagnosis  . DIABETES MELLITUS  . POLYCYSTIC OVARIAN DISEASE  . VITAMIN B12 DEFICIENCY  . VITAMIN D DEFICIENCY  . HYPERLIPIDEMIA  . OBESITY, MORBID  . ANXIETY  . PANIC DISORDER  . BULIMIA  . HEADACHE, TENSION  . DEPRESSION  . CARPAL TUNNEL SYNDROME  . LYMPHEDEMA  . ALLERGIC RHINITIS  . ASTHMA, MILD, INTERMITTENT  . TMJ SYNDROME  . GERD  . IRRITABLE BOWEL SYNDROME  . PROCTITIS  . HOT FLASHES  . INTERTRIGO, CANDIDAL  . SKIN TAG  . SEBORRHEIC KERATOSIS, INFLAMED  . OSTEOARTHRITIS  . FIBROMYALGIA  . CHRONIC FATIGUE SYNDROME  . HYPERHIDROSIS  . COUGH, CHRONIC  . DIARRHEA, ACUTE, CHRONIC  . DIVERTICULITIS, HX OF  . Right knee pain  . UTI (lower urinary tract infection)  . Acute sinusitis  .  Vaginal bleeding  . Hematuria   Past Medical History  Diagnosis Date  . Allergy     allergic rhinitis  . Anxiety   . Depression   . Hyperlipidemia   . Arthritis     osteoarthritis  . Obesity   . Lymphedema   . Tachycardia   . Neuromuscular disorder     fibromyalgia  . Hyperhidrosis   . GERD (gastroesophageal reflux disease)     EGD negative 06/2001// EGD erythematous mucosa, polyp 08/2008  . Fatty liver 07/2008    abd. ultrasound - fatty liver ; slt dilated cbd (no stones ) 06/10// abd.  ultrasound normal on 04/2006   Past Surgical History  Procedure Date  . Tonsillectomy   . Septoplasty   . Foot surgery   . Uterine tumor 09/2001  . Vagus nerve stimulator insertion   . Endometrial biopsy 01/2004  . Uterine fibroid surgery 06/2005    ablation   History  Substance Use Topics  . Smoking status: Never Smoker   . Smokeless tobacco: Not on file  . Alcohol Use: No   Family History  Problem Relation Age of Onset  . Hypertension Father   . Cancer Father  pancreatic cancer  . Hypertension Sister   . Heart disease Maternal Grandmother 60    MI  . Heart disease Paternal Grandmother 98    MI   Allergies  Allergen Reactions  . Cephalexin   . Codeine     REACTION: ? reaction  . Duloxetine   . Hydrocodone     REACTION: ? reaction  . Norelgestromin-Eth Estradiol   . Prednisone     REACTION: ? reaction  . Quetiapine   . Sertraline Hcl     REACTION: vivid dreams  . Triazolam     REACTION: ? reaction  . Zaleplon   . Zocor (Simvastatin - High Dose)     achey  . Zolpidem Tartrate     REACTION: ? reaction   Current Outpatient Prescriptions on File Prior to Visit  Medication Sig Dispense Refill  . acetaminophen (TYLENOL) 500 MG tablet Take 500 mg by mouth every 6 (six) hours as needed. OTC as directed       . allopurinol (ZYLOPRIM) 100 MG tablet daily.      Marland Kitchen ALPRAZolam (XANAX) 1 MG tablet Take 2 mg by mouth 3 (three) times daily as needed.       Marland Kitchen buPROPion  (WELLBUTRIN XL) 150 MG 24 hr tablet daily.      . cyanocobalamin (,VITAMIN B-12,) 1000 MCG/ML injection Inject 1,000 mcg into the muscle once. 1 ml (1000 mcg) IM as directed every month       . cyclobenzaprine (FLEXERIL) 5 MG tablet Take 1 tablet (5 mg total) by mouth 3 (three) times daily as needed for muscle spasms.  30 tablet  1  . FLUoxetine (PROZAC) 40 MG capsule Take 80 mg by mouth daily.       . fluticasone (FLONASE) 50 MCG/ACT nasal spray Place 2 puffs into the nose daily.      . naphazoline-glycerin (CLEAR EYES) 0.012-0.2 % SOLN Place 1-2 drops into both eyes every 4 (four) hours as needed. OTC as directed       . norethindrone-ethinyl estradiol (GILDESS 1/20) 1-20 MG-MCG tablet Take 1 tablet by mouth daily.      Marland Kitchen omeprazole (PRILOSEC) 40 MG capsule Take 1 capsule (40 mg total) by mouth 2 (two) times daily.  60 capsule  11  . oxybutynin (DITROPAN XL) 15 MG 24 hr tablet daily.      . traZODone (DESYREL) 50 MG tablet Take 6 tablets  By mouth at bedtime. But can take up to 9 tablets daily       . aluminum chloride (DRYSOL) 20 % external solution Apply topically at bedtime. Apply to affected areas at bedtime - then wash off in the AM once sweating improves - can use twice weekly       . benzonatate (TESSALON) 100 MG capsule Take 100 mg by mouth 3 (three) times daily as needed. 1-2 by mouth up to three times a day as needed       . Cholecalciferol (VITAMIN D) 1000 UNITS capsule Take 1,000 Units by mouth daily.        . cloNIDine (CATAPRES) 0.1 MG tablet       . CloNIDine HCl 0.1 MG TB12 Take by mouth 2 (two) times daily. Take 1 tablet by mouth twice a day       . desoximetasone (TOPICORT) 0.25 % cream Apply topically. Apply as directed       . guaifenesin (REFENESEN 400) 400 MG TABS Take 400 mg by mouth every 4 (four) hours as needed. OTC  as directed       . ketoconazole (NIZORAL) 2 % cream Apply topically daily. Apply to affected area once daily for 2 weeks       . lidocaine (LIDODERM) 5 %  Place 1 patch onto the skin daily. Remove & Discard patch within 12 hours or as directed by MD      . metFORMIN (GLUCOPHAGE) 500 MG tablet Take 1 tablet (500 mg total) by mouth 2 (two) times daily with a meal.  60 tablet  11  . nystatin (NYSTOP) 100000 UNIT/GM POWD Apply topically 2 (two) times daily as needed. Apply to affected area two times a day until clear asneeded       . spironolactone (ALDACTONE) 50 MG tablet Take 50 mg by mouth daily.        Marland Kitchen DISCONTD: traZODone (DESYREL) 150 MG tablet Take 150 mg by mouth at bedtime. 2 to 3 tablets by mouth at bedtime          Review of Systems Review of Systems  Constitutional: Negative for fever, appetite change,  and unexpected weight change. pos for chronic fatigue Eyes: Negative for pain and visual disturbance.  Respiratory: Negative for cough and shortness of breath.   Cardiovascular: Negative for cp or palpitations    Gastrointestinal: Negative for nausea, diarrhea and constipation.  Genitourinary: Negative for urgency and frequency.  Skin: Negative for pallor or rash   MSK pos for joint pain from weight and obesity Neurological: Negative for weakness, light-headedness, numbness and headaches.  Hematological: Negative for adenopathy. Does not bruise/bleed easily.  Psychiatric/Behavioral: pos for depression and anxiety that is at times severe with decreased motivation and emotional eating.         Objective:   Physical Exam  Constitutional: She appears well-developed and well-nourished. No distress.  HENT:  Head: Normocephalic and atraumatic.  Mouth/Throat: Oropharynx is clear and moist.  Eyes: Conjunctivae normal and EOM are normal. Pupils are equal, round, and reactive to light. No scleral icterus.  Neck: Normal range of motion. Neck supple. No JVD present. Carotid bruit is not present. No thyromegaly present.  Cardiovascular: Normal rate, regular rhythm, normal heart sounds and intact distal pulses.  Exam reveals no gallop.     Pulmonary/Chest: Effort normal and breath sounds normal. No respiratory distress. She has no wheezes.  Abdominal: Soft. Bowel sounds are normal. She exhibits no distension, no abdominal bruit and no mass. There is no tenderness.  Musculoskeletal: She exhibits tenderness. She exhibits no edema.       Knees- limited rom and tenderness  Lymphadenopathy:    She has no cervical adenopathy.  Neurological: She is alert. She has normal reflexes. No cranial nerve deficit. She exhibits normal muscle tone. Coordination normal.  Skin: Skin is warm and dry. No rash noted. No pallor.  Psychiatric: She has a normal mood and affect.          Assessment & Plan:

## 2011-11-14 NOTE — Assessment & Plan Note (Signed)
Lab today , disc at f/u

## 2011-11-14 NOTE — Assessment & Plan Note (Signed)
Lab today- disc at f/u Diet is poor- pt has a variety of reasons for that - GI and emotional

## 2011-11-14 NOTE — Assessment & Plan Note (Signed)
Suspect fueled by stress and depression Wt is up 10lb Discussed how this problem influences overall health and the risks it imposes  Reviewed plan for weight loss with lower calorie diet (via better food choices and also portion control or program like weight watchers) and exercise building up to or more than 30 minutes 5 days per week including some aerobic activity   pt is not motivated for change at this time

## 2011-11-14 NOTE — Assessment & Plan Note (Signed)
Level today- disc at follow up

## 2011-11-14 NOTE — Assessment & Plan Note (Signed)
Lab today If a1c is above 6.5 then I would like to try again to ref to DM teaching  In general- pt is dismotivated to work on diet and exercise due to depression and stresssors She will get eye exam this month

## 2011-11-15 LAB — LIPID PANEL
HDL: 42.8 mg/dL (ref >39.00)
VLDL: 53.4 mg/dL — ABNORMAL HIGH (ref 0.0–40.0)

## 2011-11-15 LAB — LDL CHOLESTEROL, DIRECT: Direct LDL: 103.7 mg/dL

## 2011-11-16 ENCOUNTER — Telehealth: Payer: Self-pay

## 2011-11-16 NOTE — Telephone Encounter (Signed)
Pt request lab results; pt said she has appt 11/20/11 to discuss labs but hoped for results sooner.Please advise.

## 2011-11-17 NOTE — Telephone Encounter (Signed)
I released her results on My Chart - let her know we will discuss them in detail on my chart  My apologies- let her know I have been out sick and unable to work for several days

## 2011-11-19 NOTE — Telephone Encounter (Signed)
Notified pt lab results are on Mychart and will discuss results at appt. tomorrow

## 2011-11-20 ENCOUNTER — Encounter: Payer: Self-pay | Admitting: Family Medicine

## 2011-11-20 ENCOUNTER — Ambulatory Visit (INDEPENDENT_AMBULATORY_CARE_PROVIDER_SITE_OTHER): Payer: Medicare Other | Admitting: Family Medicine

## 2011-11-20 VITALS — BP 136/82 | HR 62 | Temp 98.1°F | Ht 64.0 in | Wt 366.0 lb

## 2011-11-20 DIAGNOSIS — E119 Type 2 diabetes mellitus without complications: Secondary | ICD-10-CM

## 2011-11-20 DIAGNOSIS — E785 Hyperlipidemia, unspecified: Secondary | ICD-10-CM

## 2011-11-20 DIAGNOSIS — N200 Calculus of kidney: Secondary | ICD-10-CM

## 2011-11-20 DIAGNOSIS — E538 Deficiency of other specified B group vitamins: Secondary | ICD-10-CM

## 2011-11-20 DIAGNOSIS — E559 Vitamin D deficiency, unspecified: Secondary | ICD-10-CM

## 2011-11-20 MED ORDER — CYCLOBENZAPRINE HCL 5 MG PO TABS: 5.0000 mg | ORAL_TABLET | Freq: Three times a day (TID) | ORAL | Status: DC | PRN

## 2011-11-20 NOTE — Patient Instructions (Addendum)
Stop up front for nutrition referral  I sent flexeril px to the pharmacy We will not start new medicine at this time Follow up with me in 3 months  Continue counseling with Clydie Braun
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
## 2011-11-21 ENCOUNTER — Encounter: Payer: Medicare Other | Attending: Family Medicine | Admitting: Dietician

## 2011-11-21 VITALS — Ht 64.0 in | Wt 364.3 lb

## 2011-11-21 DIAGNOSIS — Z713 Dietary counseling and surveillance: Secondary | ICD-10-CM | POA: Insufficient documentation

## 2011-11-21 DIAGNOSIS — E119 Type 2 diabetes mellitus without complications: Secondary | ICD-10-CM | POA: Insufficient documentation

## 2011-11-21 NOTE — Progress Notes (Signed)
Medical Nutrition Therapy:  Appt start time: 1515 end time:  1715.   Assessment:  Primary concerns today: new onset diabetes and is here to learn how to eat and to take care of herself with the new diagnosis of diabetes.  Comes with a number of chronic illnesses.  History of UTI and kidney stones that are of the oxylate and uric acid variety; Obesity, Chronic Fatigue Syndrome/fibromyalgia, depression, Anxiety/Panic Disorder, IBS, Diverticulitis, Bulmia, PCOS, GERD, and TMJ syndrome.  A number of these problems have already impacted on her diet and today, she is concerned that with diabetes, there are no foods left for her to eat.   She has many questions and grows anxious when a direct, simple answer is not available.  BLOOD GLUCOSE MONITORING:  Currently not monitoring  HYPOGLYCEMIA: Reports no S/S of low blood glucose.  HYPERGLYCEMIA:  Reports not S/S of high blood glucose.  MEDICATIONS: Completed review of medications.  Currently not a medication specifically for lowering blood glucose.   DIETARY INTAKE:  Usual eating pattern includes mixed pattern of meals and snacks daily.  Notes that recently with snacking, she is better able to control the portion size of the chips that she enjoys.  She notes a decrease in hunger, and has on the last few days, not purposefullyskipped meals,and later recalled that she had missed a meal.  Everyday foods include a number of limited foods..  Avoided foods include foods containing purines in high proportions, oxylates, and uric acid..    24-hr recall:  B ( AM): 10:00 Honey nut Cheerios  (2 cups) with 1% milk about 1 cup.  English muffin with egg white, canadian bacon. OR Sandwich ham using the Healthy White bread 2 slices and  Lite mayo and sometimes brown mustard.  Store bran of buttermilk pancakes, with the sugar free or lite syrup.  Water.    (64-18-30 gm carb) Snk ( AM): 11;00  [place] Chips (few to many) (1 serving =15gm and more might be as  high as 52 gm for 3-4 handfuls). L ( PM): Less hunger, with new medication.  If eating at home 12:30- 3:00 Sandwich if not had one at breakfast. OR tries to have chips or Soup (Progressio) with crackers (about 55 gm carb), OR fast food,such as stuffed Burrito and nachoes from [employer] ( about 87 gm carb) Snk ( PM): Arnitra Sokol[place]ki or [place] not snack depends on the time for breakfast D ( PM): 4:30 will sometimes have Soup or Sandwich or lean Cusine, Out, grill chicken or spicy chicken or pretzel burger (59 gm carb), rarely has a baked potato, usually the side salad. Snk ( PM): Sometimes chips.(15-+/- 52 gm) Beverages: [place] Water, water (2-3 16 oz bottled water each day) sweet tea.  Usual physical activity: Currently no planned exercise. Limited in activity.  Notes that the MD caring for her lymphedema, recommended she not do exercises.  Is willing to try to work with some weights.  Estimated energy needs: HT:64 in  WT: 364.3 lb  BMI: 62.7 kg/m2  Adj WT: 198 lb (90 kg)   1400-1500 calories 165-170 g carbohydrates 110-115 g protein 40-42 g fat  Progress Towards Goal(s):  No progress.   Nutritional Diagnosis:  [place]-2.1 Inpaired nutrition utilization As related to blood glucose metabolism.  As evidenced by new diagnosis of type 2 diabetes with A1Cof 7.3 and history of fasting glucose levels > 130 .Marland Kitchen    Intervention:  Nutrition Review of the food groups and their effects on blood  glucose. Recommended that she will be using the same foods as other individuals with the exception of using the sugar substitutes and diet beverages and monitoring her portion sizes to maintain a carb level of 30-45 gm per meal and 15 gm at snack times.  She will need to monitor her intake and to limit/omit the nuts and seeds as she has issues with diverticulitis.  She will need to monitor the meats/sea food for issues with the purines.  She will need to monitor the vegetables and some fruits for the presence of oxylates.   Emphasis was  placed on using the food label, monitoring portion sizes, practicing measuring portions at home.  Using the exchange list for portions.  Use the www.calorieking.com web site for carb content.  All used together to assist with trying to count her carbs.   Recommended starting with the food labels currently in her kitchen.  Recommended going to the manufacturer web site for nutrition counts.  She can continue to order her groceries from [facility] and still have done her homework at the computer.  Recommended keeping a small diary of her favorite foods and their carb counts  Handouts given during visit include:  Living Well with Diabetes  Controlling Blood Glucose  Foot Care by [employer] suggestions for 30 and 45 gm CHO meals  Snack list  Yellow Card with diet prescription  Monitoring/Evaluation:  Dietary intake, exercise, and body weight in 4-6 weeks.  To call or e-mail with questions.  To call and schedule a f/u appointment for 60 minutes.

## 2011-11-22 NOTE — Assessment & Plan Note (Signed)
Disc goals for lipids and reasons to control them Rev labs with pt Rev low sat fat diet in detail Dietician ref made F/u 3 mo

## 2011-11-22 NOTE — Assessment & Plan Note (Signed)
Lab Results  Component Value Date   VITAMINB12 327 11/14/2011   This is currently controlled

## 2011-11-22 NOTE — Assessment & Plan Note (Signed)
Long discussion about this dx , and risks associated  There are many many barriers to lifestyle change at this point - see HPI (mainly from a mental health perspective) Lack of motivation and lack of education included Will refer to dietician to help with this  Disc imp of exercise  At this time in light of irratic eating - hesitant to start any glucose reducing meds Also suspect that glucose testing may make pt very anxious Will hold off on those until next visit  Helpful having pharmacy rep here to disc medications

## 2011-11-22 NOTE — Assessment & Plan Note (Signed)
Pt was asked by her urologist to follow very low uric acid/purine diet Dietician ref made

## 2011-11-22 NOTE — Assessment & Plan Note (Signed)
Labs and D sources discussed Pt will begin 2000 iu daily otc D3

## 2011-11-22 NOTE — Assessment & Plan Note (Signed)
Again disc this as causing many of her health problems  Mental health problems and prior stressors/ trauma play a role in emotional eating  Pt will address this with new counselor Mackenzie Bonilla as well as psychiatry  Would not be a candidate for any wt loss sx until these issues were resolved Ref to dietician today  >25 min spent with face to face with patient, >50% counseling and/or coordinating care

## 2011-12-19 ENCOUNTER — Encounter: Payer: Medicare Other | Attending: Family Medicine | Admitting: Dietician

## 2011-12-19 VITALS — Ht 64.0 in | Wt 352.9 lb

## 2011-12-19 DIAGNOSIS — Z713 Dietary counseling and surveillance: Secondary | ICD-10-CM | POA: Insufficient documentation

## 2011-12-19 DIAGNOSIS — E119 Type 2 diabetes mellitus without complications: Secondary | ICD-10-CM

## 2011-12-19 NOTE — Patient Instructions (Addendum)
   Consider the interventions you have used or experienced in the past to relieve constipation.  Then do one intervention  If the intervention does not work, talk with your pharmacist regarding an over-the counter medication for use with constipation.  Look at your food labels for the Lean Cuisine, try to keep the carb levels at about 30 gms.

## 2011-12-19 NOTE — Progress Notes (Signed)
  Medical Nutrition Therapy:  Appt start time: 1200 end time:  1300.  Assessment:  Primary concerns today: Comes today to review her carb counting and her nutritional experiences of the last 4 weeks.  Since her appointment on 11/21/2011, she has lost 11.4 lbs .   Weight is at 352.9 lb and her BMI is at 60.9 kg/m2.  She did do some meal planning and has been reading her food labels and trying to keep her carb content at the 30-45 gm per meal.  She is using her labels and the www.calorieking.com web site for her planning and resource prior to shopping. Today, she reports that over the last 2 weeks she has developed an issue with constipation.  She is having an increase in the reflux issues and this along with bowel changes she believes in constipation.  She is hesitant to use a laxative given her history of bulmia.  In the past, when her diet contained more fat, she did not experience this issue.  She Mackenzie Bonilla try to do one fatty meal to get things going.  If this does not work, then she plans to consult with her pharmacist in [place]. Today she brings a cookbook for diabetes that contains the recipes, the food label for the dish, and use limited ingredients.  She is reviewing the book for recipes she would like to try.  She is trying hard to follow a low carb diet.  But this is proving to be quiet expensive for her.  She continues to shop at [employer] and at times the fresh fruits and vegetables and the sugar free products are more expensive.  We discussed the need to try to use fruits in season and to use the frozen as well as the fresh.  She is eating out less and is making a major effort to monitor and decrease her portion sizes.   She reports that her most recent A1C on 11/14/2011 was 7.3%.  MEDICATIONS: Completed medication review.  DIETARY INTAKE:  Review of her food diary.  She had kept a diary for about 3 days during the last 4 weeks.  With the diary, she brought a number of food related questions.   She is quite concerned that she do the right thing, eat the right things in the right amounts.  Recent physical activity: Has started to do more walking in her home and a little in her neighborhood.   Estimated energy needs:  HT: 64 in  WT:352.9 lb  BMI: 60.2 kg/m2  Adj WT:194 lb (88 kg) 1400-1500 calories 165-170 g carbohydrates 119-115 g protein 40-42 g fat  Progress Towards Goal(s):  In progress.   Nutritional Diagnosis:  [place]-2.1 Inpaired nutrition utilization As related to blood glucose.  As evidenced by new diagnosis of type 2 diabetes with an A1C of 7.2% and a history of fasting blood glucose levels > 130.Marland Kitchen    Intervention:  Nutrition Completed review of food diary and review of the food label.  Made a number of recommendations for meals and snacks and changes that would assist with decreasing carb intake.   Monitoring/Evaluation:  Dietary intake, exercise, blood glucose levels, and body weight in 4-6 weeks, she is to call to make the appointment.Marland Kitchen

## 2011-12-25 ENCOUNTER — Encounter: Payer: Self-pay | Admitting: Family Medicine

## 2011-12-25 ENCOUNTER — Encounter: Payer: Self-pay | Admitting: Dietician

## 2011-12-25 ENCOUNTER — Ambulatory Visit (INDEPENDENT_AMBULATORY_CARE_PROVIDER_SITE_OTHER): Payer: Medicare Other | Admitting: Family Medicine

## 2011-12-25 VITALS — BP 134/82 | HR 76 | Temp 98.4°F | Ht 64.0 in | Wt 351.2 lb

## 2011-12-25 DIAGNOSIS — K5900 Constipation, unspecified: Secondary | ICD-10-CM

## 2011-12-25 DIAGNOSIS — J019 Acute sinusitis, unspecified: Secondary | ICD-10-CM

## 2011-12-25 MED ORDER — CIPROFLOXACIN HCL 500 MG PO TABS: 500.0000 mg | ORAL_TABLET | Freq: Two times a day (BID) | ORAL | Status: DC

## 2011-12-25 NOTE — Assessment & Plan Note (Signed)
After uri Will cover with cipro and update (numerous med intol)  Disc sympt care

## 2011-12-25 NOTE — Assessment & Plan Note (Signed)
Worse with higher fiber diet I suspect due to low fluid intake Disc imp of more fluids in diet -even if it is difficult for her  Also stool softeners/ miralax prn  Update if not starting to improve in a week or if worsening  - esp if fever or other symptoms given hx of diverticulosis

## 2011-12-25 NOTE — Patient Instructions (Addendum)
With new higher fiber diet you need to drink much more water/ fluids/ no calorie miralax is helpful over the counter as directed and stool softeners also  You may be having some diverticular irritation - so let me know if pain does not improved once bowels move better  Take the cipro for sinus infection  Update if not starting to improve in a week or if worsening

## 2011-12-25 NOTE — Progress Notes (Signed)
Subjective:    Patient ID: Mackenzie Bonilla, female    DOB: 1965/07/08, 46 y.o.   MRN: [id number]  HPI Here for sinus and GI issues  Yesterday- pain in her R face  Just got over a sinus infection- finished abx just before last visit Yellow / green d/c Dry nose overall Some cough- no ST  No fever  R ear is bothering her No rash   GI problems started after starting new diabetic diet - after last visit  Pain over L side of abdomen- crampy pain , and some ache over low abdomen Yesterday had to take a pain pill Also constipated Took 2 stool softeners yesterday- then had 2 BMs yesterday evening  Some heartburn in ams also   Stool softener- store brand  Is not drinking enough water - having a difficult time getting 2 bottles of water in a day  She did buy some crystal lite  She really hates to drink fluids   Patient Active Problem List  Diagnosis  . DIABETES MELLITUS  . POLYCYSTIC OVARIAN DISEASE  . VITAMIN B12 DEFICIENCY  . VITAMIN D DEFICIENCY  . HYPERLIPIDEMIA  . OBESITY, MORBID  . ANXIETY  . PANIC DISORDER  . BULIMIA  . HEADACHE, TENSION  . DEPRESSION  . CARPAL TUNNEL SYNDROME  . LYMPHEDEMA  . ALLERGIC RHINITIS  . ASTHMA, MILD, INTERMITTENT  . TMJ SYNDROME  . GERD  . IRRITABLE BOWEL SYNDROME  . PROCTITIS  . HOT FLASHES  . INTERTRIGO, CANDIDAL  . SKIN TAG  . SEBORRHEIC KERATOSIS, INFLAMED  . OSTEOARTHRITIS  . FIBROMYALGIA  . CHRONIC FATIGUE SYNDROME  . HYPERHIDROSIS  . COUGH, CHRONIC  . DIARRHEA, ACUTE, CHRONIC  . DIVERTICULITIS, HX OF  . Right knee pain  . UTI (lower urinary tract infection)  . Acute sinusitis  . Vaginal bleeding  . Hematuria  . Uric acid kidney stone   Past Medical History  Diagnosis Date  . Allergy     allergic rhinitis  . Anxiety   . Depression   . Hyperlipidemia   . Arthritis     osteoarthritis  . Obesity   . Lymphedema   . Tachycardia   . Neuromuscular disorder     fibromyalgia  . Hyperhidrosis   . GERD  (gastroesophageal reflux disease)     EGD negative 06/2001// EGD erythematous mucosa, polyp 08/2008  . Fatty liver 07/2008    abd. ultrasound - fatty liver ; slt dilated cbd (no stones ) 06/10// abd.  ultrasound normal on 04/2006  . Diabetes mellitus without complication    Past Surgical History  Procedure Date  . Tonsillectomy   . Septoplasty   . Foot surgery   . Uterine tumor 09/2001  . Vagus nerve stimulator insertion   . Endometrial biopsy 01/2004  . Uterine fibroid surgery 06/2005    ablation   History  Substance Use Topics  . Smoking status: Never Smoker   . Smokeless tobacco: Not on file  . Alcohol Use: No   Family History  Problem Relation Age of Onset  . Hypertension Father   . Cancer Father     pancreatic cancer  . Hypertension Sister   . Heart disease Maternal Grandmother 60    MI  . Heart disease Paternal Grandmother 23    MI   Allergies  Allergen Reactions  . Cephalexin   . Codeine     REACTION: ? reaction  . Duloxetine   . Hydrocodone     REACTION: ? reaction  .  Levaquin (Levofloxacin) Nausea Only  . Metformin And Related     GI side effects   . Norelgestromin-Eth Estradiol   . Prednisone     REACTION: ? reaction  . Quetiapine   . Sertraline Hcl     REACTION: vivid dreams  . Triazolam     REACTION: ? reaction  . Zaleplon   . Zocor (Simvastatin - High Dose)     achey  . Zolpidem Tartrate     REACTION: ? reaction   Current Outpatient Prescriptions on File Prior to Visit  Medication Sig Dispense Refill  . acetaminophen (TYLENOL) 500 MG tablet Take 500 mg by mouth every 6 (six) hours as needed. OTC as directed       . allopurinol (ZYLOPRIM) 100 MG tablet daily.      Marland Kitchen ALPRAZolam (XANAX) 1 MG tablet Take 2 mg by mouth 3 (three) times daily as needed.       Marland Kitchen aluminum chloride (DRYSOL) 20 % external solution Apply topically at bedtime. Apply to affected areas at bedtime - then wash off in the AM once sweating improves - can use twice weekly        . benzonatate (TESSALON) 100 MG capsule Take 100 mg by mouth 3 (three) times daily as needed. 1-2 by mouth up to three times a day as needed       . buPROPion (WELLBUTRIN XL) 150 MG 24 hr tablet 300 mg daily.       . Cholecalciferol (VITAMIN D) 1000 UNITS capsule Take 1,000 Units by mouth daily.        . cloNIDine (CATAPRES) 0.1 MG tablet       . CloNIDine HCl 0.1 MG TB12 Take by mouth 2 (two) times daily. Take 1 tablet by mouth twice a day       . cyanocobalamin (,VITAMIN B-12,) 1000 MCG/ML injection Inject 1,000 mcg into the muscle once. 1 ml (1000 mcg) IM as directed every month       . cyclobenzaprine (FLEXERIL) 5 MG tablet Take 1 tablet (5 mg total) by mouth 3 (three) times daily as needed for muscle spasms.  30 tablet  1  . desoximetasone (TOPICORT) 0.25 % cream Apply topically. Apply as directed       . dimenhyDRINATE (DRAMAMINE) 50 MG tablet Take 50 mg by mouth every 8 (eight) hours as needed.      Marland Kitchen FLUoxetine (PROZAC) 40 MG capsule Take 80 mg by mouth daily.       . fluticasone (FLONASE) 50 MCG/ACT nasal spray Place 2 puffs into the nose daily.      Marland Kitchen guaifenesin (REFENESEN 400) 400 MG TABS Take 400 mg by mouth every 4 (four) hours as needed. OTC as directed       . ketoconazole (NIZORAL) 2 % cream Apply topically daily. Apply to affected area once daily for 2 weeks       . lidocaine (LIDODERM) 5 % Place 1 patch onto the skin daily. Remove & Discard patch within 12 hours or as directed by MD      . naphazoline-glycerin (CLEAR EYES) 0.012-0.2 % SOLN Place 1-2 drops into both eyes every 4 (four) hours as needed. OTC as directed       . norethindrone-ethinyl estradiol (GILDESS 1/20) 1-20 MG-MCG tablet Take 1 tablet by mouth daily.      Marland Kitchen nystatin (NYSTOP) 100000 UNIT/GM POWD Apply topically 2 (two) times daily as needed. Apply to affected area two times a day until clear asneeded       .  omeprazole (PRILOSEC) 40 MG capsule Take 1 capsule (40 mg total) by mouth 2 (two) times daily.  60  capsule  11  . oxybutynin (DITROPAN XL) 15 MG 24 hr tablet daily.      Marland Kitchen spironolactone (ALDACTONE) 50 MG tablet Take 50 mg by mouth daily.        . traMADol (ULTRAM) 50 MG tablet Take 50 mg by mouth every 6 (six) hours as needed.      . traZODone (DESYREL) 50 MG tablet Take 6 tablets  By mouth at bedtime. But can take up to 9 tablets daily       . metFORMIN (GLUCOPHAGE) 500 MG tablet Take 1 tablet (500 mg total) by mouth 2 (two) times daily with a meal.  60 tablet  11      Review of Systems Review of Systems  Constitutional: Negative for fever, appetite change,  and unexpected weight change. pos for baseline fatigue Eyes: Negative for pain and visual disturbance.  ENT pos for nasal cong and purulent drainage and facial pain  Respiratory: Negative for cough and shortness of breath.   Cardiovascular: Negative for cp or palpitations    Gastrointestinal: Negative for nausea, diarrhea and pos for constipation with bloating, neg for blood in stool or dark stool Genitourinary: Negative for urgency and frequency.  Skin: Negative for pallor or rash   Neurological: Negative for weakness, light-headedness, numbness and headaches.  Hematological: Negative for adenopathy. Does not bruise/bleed easily.  Psychiatric/Behavioral: Negative for dysphoric mood. The patient is not nervous/anxious.         Objective:   Physical Exam  Constitutional: She appears well-developed. No distress.       Morbidly obese and well appearing   HENT:  Head: Normocephalic and atraumatic.  Right Ear: External ear normal.  Left Ear: External ear normal.  Mouth/Throat: Oropharynx is clear and moist. No oropharyngeal exudate.       Nares are injected and congested  R sided sinus tenderness/ maxillary Post nasal drip   Eyes: Conjunctivae normal and EOM are normal. Pupils are equal, round, and reactive to light. Right eye exhibits no discharge. Left eye exhibits no discharge.  Neck: Normal range of motion. Neck supple.  No thyromegaly present.  Cardiovascular: Normal rate, regular rhythm and intact distal pulses.   Pulmonary/Chest: Effort normal and breath sounds normal. No respiratory distress. She has no wheezes.  Abdominal: Soft. Bowel sounds are normal. She exhibits no distension and no mass. There is tenderness. There is no rebound and no guarding.       Mild tenderness L mid abdomen  Nl M palp but difficult exam due to obesity Neg murphy sign  Musculoskeletal: She exhibits no edema.  Lymphadenopathy:    She has no cervical adenopathy.  Neurological: She is alert.  Skin: Skin is warm. No rash noted. No erythema. No pallor.  Psychiatric:       Depressed affect  She voices negative comments about general health and lifestyle change          Assessment & Plan:

## 2012-01-23 ENCOUNTER — Encounter: Payer: Medicare Other | Attending: Family Medicine | Admitting: Dietician

## 2012-01-23 ENCOUNTER — Encounter: Payer: Self-pay | Admitting: Dietician

## 2012-01-23 VITALS — Ht 64.0 in | Wt 350.0 lb

## 2012-01-23 DIAGNOSIS — Z713 Dietary counseling and surveillance: Secondary | ICD-10-CM | POA: Insufficient documentation

## 2012-01-23 DIAGNOSIS — E119 Type 2 diabetes mellitus without complications: Secondary | ICD-10-CM | POA: Insufficient documentation

## 2012-01-23 NOTE — Patient Instructions (Addendum)
   For the family get together at Christmas; Choose the foods you don't have ob a daily basis.  Aim for 1/2-1 cup of the hash  [employer] or the breakfast casserole at 15 gm for 1/2 cup and 30 gm for 1 cup.  Have as serving (about 1 cup of the fruit)  Stay with the 2 rolls of the ham delights at 20-30 gm of carb.  A couple or the cheese straws and a couple of the sausage balls.  Have a sliver of each of the breads.  Carry out one bag of trash per day and mark off on the calendar.  Smart Ones or the [employer] cost and then go on the computer to www.calorieking.com and look for the food label. Aim for a meal that is 30-38-40 gm carb and about 300 calories.  Use the 50 % juice.50% less calories.  Carb at 5-10 gm per serving.

## 2012-01-23 NOTE — Progress Notes (Signed)
  Medical Nutrition Therapy:  Appt start time: 1200 end time:  1300.  Assessment:  Primary concerns today: Concerned that she has not lost weight.  Has had her depression to come back and has found it hard to cope.  She notes that for her," coping is eating all the wrong things." Today she had lost 2.9 lb since her last visit four weeks ago. She was tearful and noted "that the last 4 weeks have  been hard.  Her depression has come back and along with Seasonal Affective Disorder and the holidays, it has been difficult to just do her daily task."  She reports that she is watching TV, reading and sleeping.  Many of her household tasks have gone undone.  She is having problems "keeping her thinking straight at times."  She comes with anxiety regarding the family Christmas celebration that is this Saturday.  She reminds me she does not get along with her sister and that the sisters have pulled together a carb and fat laden menu.  She brings the menu and some of the ingredients and is asking for assistance with her food choices.  MEDICATIONS: Med review completed.  DIETARY INTAKE:  24-hr recall:  Ask that we not cover her intake because she can't really recall all that she has been eating.  She did report having ordered a "[place] with pineapple, ham, and cheese toping with just a little tomato sauce and she just had to try their chocolate cookie."    She request that we review the menu for the family get-together on Saturday and help her make healthier choices.  Together we reviewed the menu and I attempted to guide her in making decisions regarding healthier choices. Beverages: Has continued to try to drink water.  Reports having had one diet coke last week.  Recent physical activity: Decreased activity with the onset of the Depression.  Not completing household task.  Estimated energy needs: HT: 64 in  WT: 350.0 lb  BMI: 60.2 kg/m2  Adj WT: 194 lb (88 kg) 1400-1500 calories 165-170 carbs g  carbohydrates 119-115 g protein 40-42 g fat  Progress Towards Goal(s):  In progress.   Nutritional Diagnosis:  [place]-2.1 Inpaired nutrition utilization As related to blood glucose.  As evidenced by new diagnosis of type 2 diabetes with an A1C of 7.2% and a history of fasting blood glucose levels > 130 mg/dl.    Intervention:  Nutrition Recommended that she aim for 1/2 -1 cup of the egg/sausage/cheese/potato casserole (15-30 gm CHO) and 1-2 of the small roll ham delights (20-30 gm CHO) a small serving of fruit (15 gm CHO), and a sliver of each of the sweetened breads (30 gm CHO).  The goal being to try to stay at about 60 gm CHO for this meal and allow her to feel that she has participated and not over indulged in her carb intake at this special occasion.  Handouts given during visit include:  Novo Nordisk Carb Counting Booklet  Monitoring/Evaluation:  Dietary intake, exercise, and body weight in 4-6 weeks.  She will contact her new insurance company and see how many hours of visits she has left of  her 10 hours of ititial diabetes education with Medicare.

## 2012-02-07 ENCOUNTER — Telehealth: Payer: Self-pay | Admitting: Family Medicine

## 2012-02-07 NOTE — Telephone Encounter (Signed)
Patient has a follow up with Dr. Milinda Antis on 12/30.  She feels like she just needs bloodwork for her A1C and does not need an appointment with Dr. Milinda Antis.  Is that okay?  Also, she wants to know if she has to be fasting for an A1C.

## 2012-02-07 NOTE — Telephone Encounter (Signed)
We can do whatever she wants- if she only wants a1c without a visit , that is fine- go ahead and schedule and it does not need to be fasting

## 2012-02-07 NOTE — Telephone Encounter (Signed)
Patient also states she has depression right now due to it being 1 year since her dad passed away.  She said she has been making some good choices in her eating and some bad choices.  She is not sure if we should wait to do her A1C for a couple of months.

## 2012-02-08 NOTE — Telephone Encounter (Signed)
Advise pt that we could do what she wanted to do we just need to check her a1c. Pt decided to keep appt with Dr. Milinda Antis and get a1c done at [facility], pt is keeping appt for 02/11/12

## 2012-02-11 ENCOUNTER — Ambulatory Visit (INDEPENDENT_AMBULATORY_CARE_PROVIDER_SITE_OTHER): Payer: Medicare Other | Admitting: Family Medicine

## 2012-02-11 ENCOUNTER — Ambulatory Visit: Payer: Medicare Other | Admitting: Family Medicine

## 2012-02-11 ENCOUNTER — Encounter: Payer: Self-pay | Admitting: Family Medicine

## 2012-02-11 VITALS — BP 134/78 | HR 102 | Temp 98.1°F | Ht 64.0 in | Wt 352.8 lb

## 2012-02-11 DIAGNOSIS — M79672 Pain in left foot: Secondary | ICD-10-CM | POA: Insufficient documentation

## 2012-02-11 DIAGNOSIS — M79609 Pain in unspecified limb: Secondary | ICD-10-CM

## 2012-02-11 DIAGNOSIS — E785 Hyperlipidemia, unspecified: Secondary | ICD-10-CM

## 2012-02-11 DIAGNOSIS — E119 Type 2 diabetes mellitus without complications: Secondary | ICD-10-CM

## 2012-02-11 LAB — LIPID PANEL: Cholesterol: 210 mg/dL — ABNORMAL HIGH (ref 0–200)

## 2012-02-11 LAB — HEMOGLOBIN A1C: Hgb A1c MFr Bld: 7 % — ABNORMAL HIGH (ref 4.6–6.5)

## 2012-02-11 NOTE — Assessment & Plan Note (Signed)
Unfortunately- pt can no longer afford to see dietician - I told her to have them call me if we need to change any orders It was helpful, however - and eating improved until depression became overwhelming again  a1c today-hope for some improvement Wt is up 2 lb  Urged to keep working on diet and exercise

## 2012-02-11 NOTE — Progress Notes (Signed)
Subjective:    Patient ID: Mackenzie Bonilla, female    DOB: 10-27-1965, 46 y.o.   MRN: [id number]  HPI Here for f/u of DM and also chronic med problems  Feeling ok overall  Depression is still a problem - and in financial distress due to her medicine bills   Diabetes Does not check her home sugars  DM diet - did really well until the month of December -- her depression got worse and then she eats more sweets  Exercise -not a lot because she is always so sleepy Wt is up 2 lbs  Symptoms A1C last  Lab Results  Component Value Date   HGBA1C 7.3* 11/14/2011  hopes this will be down   Wants to check cholesterol today also   No problems with medications  Renal protection Last eye exam   last visit ref to nutritionist and also had a counselor through [facility]  (dietician was Seward Grater)- she thought this was free and then got charged (so she has not met with her again)  Needs to get "dx number changed"  She had to move and lost her counselor  Also keeps calling [facility] and no luck getting someone to come out or get her shoes They have helped get her some resources   Her L foot is bothering her - big toe bothers her - fleeting stinging pain occas , and feels like there is a knot under it sometimes when she is walking  Is on allopurinol  No numbness   Gets her B12 shots at her pharmacy   Patient Active Problem List  Diagnosis  . DIABETES MELLITUS  . POLYCYSTIC OVARIAN DISEASE  . VITAMIN B12 DEFICIENCY  . VITAMIN D DEFICIENCY  . HYPERLIPIDEMIA  . OBESITY, MORBID  . ANXIETY  . PANIC DISORDER  . BULIMIA  . HEADACHE, TENSION  . DEPRESSION  . CARPAL TUNNEL SYNDROME  . LYMPHEDEMA  . ALLERGIC RHINITIS  . ASTHMA, MILD, INTERMITTENT  . TMJ SYNDROME  . GERD  . IRRITABLE BOWEL SYNDROME  . PROCTITIS  . HOT FLASHES  . INTERTRIGO, CANDIDAL  . SKIN TAG  . SEBORRHEIC KERATOSIS, INFLAMED  . OSTEOARTHRITIS  . FIBROMYALGIA  . CHRONIC FATIGUE SYNDROME  . HYPERHIDROSIS  . COUGH, CHRONIC    . DIARRHEA, ACUTE, CHRONIC  . DIVERTICULITIS, HX OF  . Right knee pain  . UTI (lower urinary tract infection)  . Acute bacterial sinusitis  . Vaginal bleeding  . Hematuria  . Uric acid kidney stone  . Acute constipation   Past Medical History  Diagnosis Date  . Allergy     allergic rhinitis  . Anxiety   . Depression   . Hyperlipidemia   . Arthritis     osteoarthritis  . Obesity   . Lymphedema   . Tachycardia   . Neuromuscular disorder     fibromyalgia  . Hyperhidrosis   . GERD (gastroesophageal reflux disease)     EGD negative 06/2001// EGD erythematous mucosa, polyp 08/2008  . Fatty liver 07/2008    abd. ultrasound - fatty liver ; slt dilated cbd (no stones ) 06/10// abd.  ultrasound normal on 04/2006  . Diabetes mellitus without complication    Past Surgical History  Procedure Date  . Tonsillectomy   . Septoplasty   . Foot surgery   . Uterine tumor 09/2001  . Vagus nerve stimulator insertion   . Endometrial biopsy 01/2004  . Uterine fibroid surgery 06/2005    ablation   History  Substance Use Topics  .  Smoking status: Never Smoker   . Smokeless tobacco: Not on file  . Alcohol Use: No   Family History  Problem Relation Age of Onset  . Hypertension Father   . Cancer Father     pancreatic cancer  . Hypertension Sister   . Heart disease Maternal Grandmother 60    MI  . Heart disease Paternal Grandmother 67    MI   Allergies  Allergen Reactions  . Cephalexin   . Codeine     REACTION: ? reaction  . Duloxetine   . Hydrocodone     REACTION: ? reaction  . Levaquin (Levofloxacin) Nausea Only  . Metformin And Related     GI side effects   . Norelgestromin-Eth Estradiol   . Prednisone     REACTION: ? reaction  . Quetiapine   . Sertraline Hcl     REACTION: vivid dreams  . Triazolam     REACTION: ? reaction  . Zaleplon   . Zocor (Simvastatin - High Dose)     achey  . Zolpidem Tartrate     REACTION: ? reaction   Current Outpatient Prescriptions  on File Prior to Visit  Medication Sig Dispense Refill  . acetaminophen (TYLENOL) 500 MG tablet Take 500 mg by mouth every 6 (six) hours as needed. OTC as directed       . allopurinol (ZYLOPRIM) 100 MG tablet daily.      Marland Kitchen ALPRAZolam (XANAX) 1 MG tablet Take 2 mg by mouth 3 (three) times daily as needed.       Marland Kitchen aluminum chloride (DRYSOL) 20 % external solution Apply topically at bedtime. Apply to affected areas at bedtime - then wash off in the AM once sweating improves - can use twice weekly       . benzonatate (TESSALON) 100 MG capsule Take 100 mg by mouth 3 (three) times daily as needed. 1-2 by mouth up to three times a day as needed       . buPROPion (WELLBUTRIN XL) 150 MG 24 hr tablet 300 mg daily.       . calcium carbonate (TUMS - DOSED IN MG ELEMENTAL CALCIUM) 500 MG chewable tablet Chew 1 tablet by mouth as needed.      . Cholecalciferol (VITAMIN D) 1000 UNITS capsule Take 1,000 Units by mouth daily.        . ciprofloxacin (CIPRO) 500 MG tablet Take 1 tablet (500 mg total) by mouth 2 (two) times daily.  14 tablet  0  . cloNIDine (CATAPRES) 0.1 MG tablet       . CloNIDine HCl 0.1 MG TB12 Take by mouth 2 (two) times daily. Take 1 tablet by mouth twice a day       . cyanocobalamin (,VITAMIN B-12,) 1000 MCG/ML injection Inject 1,000 mcg into the muscle once. 1 ml (1000 mcg) IM as directed every month       . cyclobenzaprine (FLEXERIL) 5 MG tablet Take 1 tablet (5 mg total) by mouth 3 (three) times daily as needed for muscle spasms.  30 tablet  1  . desoximetasone (TOPICORT) 0.25 % cream Apply topically. Apply as directed       . dimenhyDRINATE (DRAMAMINE) 50 MG tablet Take 50 mg by mouth every 8 (eight) hours as needed.      Marland Kitchen FLUoxetine (PROZAC) 40 MG capsule Take 80 mg by mouth daily.       . fluticasone (FLONASE) 50 MCG/ACT nasal spray Place 2 puffs into the nose daily.      Marland Kitchen  guaifenesin (REFENESEN 400) 400 MG TABS Take 400 mg by mouth every 4 (four) hours as needed. OTC as directed         . ketoconazole (NIZORAL) 2 % cream Apply topically daily. Apply to affected area once daily for 2 weeks       . lidocaine (LIDODERM) 5 % Place 1 patch onto the skin daily. Remove & Discard patch within 12 hours or as directed by MD      . naphazoline-glycerin (CLEAR EYES) 0.012-0.2 % SOLN Place 1-2 drops into both eyes every 4 (four) hours as needed. OTC as directed       . norethindrone-ethinyl estradiol (GILDESS 1/20) 1-20 MG-MCG tablet Take 1 tablet by mouth daily.      Marland Kitchen nystatin (NYSTOP) 100000 UNIT/GM POWD Apply topically 2 (two) times daily as needed. Apply to affected area two times a day until clear asneeded       . omeprazole (PRILOSEC) 40 MG capsule Take 1 capsule (40 mg total) by mouth 2 (two) times daily.  60 capsule  11  . oxybutynin (DITROPAN XL) 15 MG 24 hr tablet daily.      Marland Kitchen spironolactone (ALDACTONE) 50 MG tablet Take 50 mg by mouth daily.        . traMADol (ULTRAM) 50 MG tablet Take 50 mg by mouth every 6 (six) hours as needed.      . traZODone (DESYREL) 50 MG tablet Take 6 tablets  By mouth at bedtime. But can take up to 9 tablets daily           Review of Systems Review of Systems  Constitutional: Negative for fever, appetite change,  and unexpected weight change. pos for chronic fatigue  Eyes: Negative for pain and visual disturbance.  Respiratory: Negative for cough and shortness of breath.   Cardiovascular: Negative for cp or palpitations    Gastrointestinal: Negative for nausea, diarrhea and constipation.  Genitourinary: Negative for urgency and frequency.  Skin: Negative for pallor or rash   MSK pos for foot pain, neg for joint swelling or redness Neurological: Negative for weakness, light-headedness, numbness and headaches.  Hematological: Negative for adenopathy. Does not bruise/bleed easily.  Psychiatric/Behavioral: pos for depression and anxiety.         Objective:   Physical Exam  Constitutional: She appears well-developed and well-nourished. No  distress.       Morbidly obese and depressed appearing   HENT:  Head: Normocephalic and atraumatic.  Mouth/Throat: Oropharynx is clear and moist. No oropharyngeal exudate.  Eyes: Conjunctivae normal and EOM are normal. Pupils are equal, round, and reactive to light. No scleral icterus.  Neck: Normal range of motion. Neck supple. No JVD present. Carotid bruit is not present. No thyromegaly present.  Cardiovascular: Normal rate, regular rhythm, normal heart sounds and intact distal pulses.  Exam reveals no gallop.   Pulmonary/Chest: Effort normal and breath sounds normal. No respiratory distress. She has no wheezes. She exhibits no tenderness.  Abdominal: Soft. Bowel sounds are normal. She exhibits no distension, no abdominal bruit and no mass. There is no tenderness.  Musculoskeletal: She exhibits tenderness. She exhibits no edema.       Tender under head of L first metatarsal Nl deformity or skin change Nl rom toes   Lymphadenopathy:    She has no cervical adenopathy.  Neurological: She is alert. She has normal reflexes. No cranial nerve deficit. She exhibits normal muscle tone. Coordination normal.  Skin: Skin is warm and dry. No rash noted. No  erythema. No pallor.  Psychiatric: Her speech is normal. Thought content normal. Her mood appears anxious. Her affect is labile. She is slowed. She exhibits a depressed mood. She expresses no homicidal and no suicidal ideation. She is attentive.          Assessment & Plan:

## 2012-02-11 NOTE — Assessment & Plan Note (Signed)
Lab today Some improvement in diet temporarily - hopes to get back on track

## 2012-02-11 NOTE — Patient Instructions (Addendum)
See your psychiatrist to keep working on your depression and eating  Labs today  Make effort to push yourself to exercise some -even if you are tired  Get a corn pad to use under base of your toe to cushion - hope this will help with your pain

## 2012-02-11 NOTE — Assessment & Plan Note (Signed)
Pt is having pain under head of first metatarsal L foot Urged to get a pad for shoe or foot to offload pressure  Obesity may play a role

## 2012-02-12 ENCOUNTER — Encounter: Payer: Self-pay | Admitting: *Deleted

## 2012-02-19 ENCOUNTER — Encounter: Payer: Self-pay | Admitting: Family Medicine

## 2012-02-19 ENCOUNTER — Telehealth: Payer: Self-pay | Admitting: Family Medicine

## 2012-02-19 ENCOUNTER — Ambulatory Visit (INDEPENDENT_AMBULATORY_CARE_PROVIDER_SITE_OTHER): Payer: Medicare Other | Admitting: Family Medicine

## 2012-02-19 VITALS — BP 128/84 | HR 98 | Temp 98.0°F | Ht 64.0 in | Wt 353.8 lb

## 2012-02-19 DIAGNOSIS — H1030 Unspecified acute conjunctivitis, unspecified eye: Secondary | ICD-10-CM | POA: Insufficient documentation

## 2012-02-19 MED ORDER — NEOMYCIN-POLYMYXIN-HC 3.5-10000-1 OP SUSP: 1.0000 [drp] | Freq: Three times a day (TID) | OPHTHALMIC | Status: DC

## 2012-02-19 MED ORDER — BENZONATATE 200 MG PO CAPS: 200.0000 mg | ORAL_CAPSULE | Freq: Three times a day (TID) | ORAL | Status: DC | PRN

## 2012-02-19 NOTE — Patient Instructions (Addendum)
Use the cortisporin eye drops for conjunctivitis (pink eye) as directed If no improvement in a week or if worse- let me know  Drink lots of fluids and rest for your cold  Use tessalon for cough as needed

## 2012-02-19 NOTE — Progress Notes (Signed)
Subjective:    Patient ID: Mackenzie Bonilla, female    DOB: 01-13-1966, 47 y.o.   MRN: [id number]  HPI Here with pink eye symptoms   Worse in R eye - and she has itching and redness and irritation  No vision problems  Some discharge from eyes - clear  No headache  Some crust along eyelids, worse in the am   Also has runny nose with yellow d/c She may have a cold   Has been coughing  A little st from cough  No fever   Patient Active Problem List  Diagnosis  . DIABETES MELLITUS  . POLYCYSTIC OVARIAN DISEASE  . VITAMIN B12 DEFICIENCY  . VITAMIN D DEFICIENCY  . HYPERLIPIDEMIA  . OBESITY, MORBID  . ANXIETY  . PANIC DISORDER  . BULIMIA  . HEADACHE, TENSION  . DEPRESSION  . CARPAL TUNNEL SYNDROME  . LYMPHEDEMA  . ALLERGIC RHINITIS  . ASTHMA, MILD, INTERMITTENT  . TMJ SYNDROME  . GERD  . IRRITABLE BOWEL SYNDROME  . PROCTITIS  . HOT FLASHES  . INTERTRIGO, CANDIDAL  . SKIN TAG  . SEBORRHEIC KERATOSIS, INFLAMED  . OSTEOARTHRITIS  . FIBROMYALGIA  . CHRONIC FATIGUE SYNDROME  . HYPERHIDROSIS  . COUGH, CHRONIC  . DIARRHEA, ACUTE, CHRONIC  . DIVERTICULITIS, HX OF  . Right knee pain  . UTI (lower urinary tract infection)  . Acute bacterial sinusitis  . Vaginal bleeding  . Hematuria  . Uric acid kidney stone  . Acute constipation  . Left foot pain   Past Medical History  Diagnosis Date  . Allergy     allergic rhinitis  . Anxiety   . Depression   . Hyperlipidemia   . Arthritis     osteoarthritis  . Obesity   . Lymphedema   . Tachycardia   . Neuromuscular disorder     fibromyalgia  . Hyperhidrosis   . GERD (gastroesophageal reflux disease)     EGD negative 06/2001// EGD erythematous mucosa, polyp 08/2008  . Fatty liver 07/2008    abd. ultrasound - fatty liver ; slt dilated cbd (no stones ) 06/10// abd.  ultrasound normal on 04/2006  . Diabetes mellitus without complication    Past Surgical History  Procedure Date  . Tonsillectomy   . Septoplasty   .  Foot surgery   . Uterine tumor 09/2001  . Vagus nerve stimulator insertion   . Endometrial biopsy 01/2004  . Uterine fibroid surgery 06/2005    ablation   History  Substance Use Topics  . Smoking status: Never Smoker   . Smokeless tobacco: Not on file  . Alcohol Use: No   Family History  Problem Relation Age of Onset  . Hypertension Father   . Cancer Father     pancreatic cancer  . Hypertension Sister   . Heart disease Maternal Grandmother 60    MI  . Heart disease Paternal Grandmother 58    MI   Allergies  Allergen Reactions  . Cephalexin   . Codeine     REACTION: ? reaction  . Duloxetine   . Hydrocodone     REACTION: ? reaction  . Levaquin (Levofloxacin) Nausea Only  . Metformin And Related     GI side effects   . Norelgestromin-Eth Estradiol   . Prednisone     REACTION: ? reaction  . Quetiapine   . Sertraline Hcl     REACTION: vivid dreams  . Triazolam     REACTION: ? reaction  . Zaleplon   .  Zocor (Simvastatin - High Dose)     achey  . Zolpidem Tartrate     REACTION: ? reaction   Current Outpatient Prescriptions on File Prior to Visit  Medication Sig Dispense Refill  . acetaminophen (TYLENOL) 500 MG tablet Take 500 mg by mouth every 6 (six) hours as needed. OTC as directed       . allopurinol (ZYLOPRIM) 100 MG tablet daily.      Marland Kitchen ALPRAZolam (XANAX) 1 MG tablet Take 2 mg by mouth 3 (three) times daily as needed.       Marland Kitchen aluminum chloride (DRYSOL) 20 % external solution Apply topically at bedtime. Apply to affected areas at bedtime - then wash off in the AM once sweating improves - can use twice weekly       . buPROPion (WELLBUTRIN XL) 150 MG 24 hr tablet 300 mg daily.       . calcium carbonate (TUMS - DOSED IN MG ELEMENTAL CALCIUM) 500 MG chewable tablet Chew 1 tablet by mouth as needed.      . Cholecalciferol (VITAMIN D) 1000 UNITS capsule Take 1,000 Units by mouth daily.        . cloNIDine (CATAPRES) 0.1 MG tablet       . CloNIDine HCl 0.1 MG TB12 Take  by mouth 2 (two) times daily. Take 1 tablet by mouth twice a day       . cyanocobalamin (,VITAMIN B-12,) 1000 MCG/ML injection Inject 1,000 mcg into the muscle once. 1 ml (1000 mcg) IM as directed every month       . cyclobenzaprine (FLEXERIL) 5 MG tablet Take 1 tablet (5 mg total) by mouth 3 (three) times daily as needed for muscle spasms.  30 tablet  1  . desoximetasone (TOPICORT) 0.25 % cream Apply topically. Apply as directed       . dimenhyDRINATE (DRAMAMINE) 50 MG tablet Take 50 mg by mouth every 8 (eight) hours as needed.      Marland Kitchen FLUoxetine (PROZAC) 40 MG capsule Take 80 mg by mouth daily.       . fluticasone (FLONASE) 50 MCG/ACT nasal spray Place 2 puffs into the nose daily.      Marland Kitchen guaifenesin (REFENESEN 400) 400 MG TABS Take 400 mg by mouth every 4 (four) hours as needed. OTC as directed       . ketoconazole (NIZORAL) 2 % cream Apply topically daily. Apply to affected area once daily for 2 weeks       . lidocaine (LIDODERM) 5 % Place 1 patch onto the skin daily. Remove & Discard patch within 12 hours or as directed by MD      . naphazoline-glycerin (CLEAR EYES) 0.012-0.2 % SOLN Place 1-2 drops into both eyes every 4 (four) hours as needed. OTC as directed       . norethindrone-ethinyl estradiol (GILDESS 1/20) 1-20 MG-MCG tablet Take 1 tablet by mouth daily.      Marland Kitchen nystatin (NYSTOP) 100000 UNIT/GM POWD Apply topically 2 (two) times daily as needed. Apply to affected area two times a day until clear asneeded       . omeprazole (PRILOSEC) 40 MG capsule Take 1 capsule (40 mg total) by mouth 2 (two) times daily.  60 capsule  11  . oxybutynin (DITROPAN XL) 15 MG 24 hr tablet daily.      Marland Kitchen spironolactone (ALDACTONE) 50 MG tablet Take 50 mg by mouth daily.        . traMADol (ULTRAM) 50 MG tablet Take 50 mg  by mouth every 6 (six) hours as needed.      . traZODone (DESYREL) 50 MG tablet Take 6 tablets  By mouth at bedtime. But can take up to 9 tablets daily             Review of Systems Review  of Systems  Constitutional: Negative for fever, appetite change,  and unexpected weight change.  ENT pos for rhinorrhea and cong, neg for sinus pain  Eyes: Negative for  visual disturbance. pos for pain and itching and d/c Respiratory: Negative for wheeze and shortness of breath.   Cardiovascular: Negative for cp or palpitations    Gastrointestinal: Negative for nausea, diarrhea and constipation.  Genitourinary: Negative for urgency and frequency.  Skin: Negative for pallor or rash   Neurological: Negative for weakness, light-headedness, numbness and headaches.  Hematological: Negative for adenopathy. Does not bruise/bleed easily.  Psychiatric/Behavioral: Negative for dysphoric mood. The patient is not nervous/anxious.        Objective:   Physical Exam  Constitutional: She appears well-developed and well-nourished. No distress.       Morbidly obese and well appearing   HENT:  Head: Normocephalic and atraumatic.  Right Ear: External ear normal.  Left Ear: External ear normal.  Mouth/Throat: Oropharynx is clear and moist. No oropharyngeal exudate.       Nares are injected and congested  No facial tenderness  Eyes: EOM are normal. Pupils are equal, round, and reactive to light. Right eye exhibits discharge. Left eye exhibits discharge. No scleral icterus.       Conj injection worse on R with cloudy d/c and no vision changes No lid swelling but some redness  Neck: Normal range of motion. Neck supple.  Cardiovascular: Normal rate and regular rhythm.   Pulmonary/Chest: Effort normal and breath sounds normal. No respiratory distress. She has no wheezes. She has no rales.  Lymphadenopathy:    She has no cervical adenopathy.  Neurological: She is alert.  Skin: Skin is warm and dry. No rash noted. There is erythema.  Psychiatric: She has a normal mood and affect.          Assessment & Plan:

## 2012-02-19 NOTE — Assessment & Plan Note (Signed)
Pt does have mild uri but her eye symptoms seem out of proportion with a pure viral illness tx with cortisporin drops and update if no imp  Disc hygiene and sympt care  Has tessalon for cough symptoms with cold

## 2012-02-19 NOTE — Telephone Encounter (Signed)
Patient Information:  Caller Name: [name]  Phone: [phone]  Patient: Mackenzie, Bonilla  Gender: Female  DOB: 1965-06-28  Age: 47 Years  PCP: Roxy Manns [facility])  Pregnant: No  Office Follow Up:  Does the office need to follow up with this patient?: No  Instructions For The Office: N/A  RN Note:  Eyes have been bothering patient on and off since December 2013.  States that eye itching intensified on 02/16/11 with itching and watering.  Whites of eyes are pink with lids puffy.  Lashes matte together at rest times and night.  Also states has had green- yellow drainage from nose > than 10 days with cough. Patient denies facial pain or pressure.  Patient has multiple allergies.  Appointment made for 16:00 with Dr. Milinda Antis.  Patient unable to reach office in time for 15:30 appointment.  Symptoms  Reason For Call & Symptoms: Eye are itching and swollen.  Has bothered her for past month has gotten worse.  Reviewed Health History In EMR: Yes  Reviewed Medications In EMR: Yes  Reviewed Allergies In EMR: Yes  Reviewed Surgeries / Procedures: Yes  Date of Onset of Symptoms: 02/16/2012  Treatments Tried: Just washing eyes  Treatments Tried Worked: No OB / GYN:  LMP: Unknown  Guideline(s) Used:  Eye - Pus or Discharge  Disposition Per Guideline:   See Today in Office  Reason For Disposition Reached:   Lots of yellow or green nasal discharge  Advice Given:  Call Back If  You become worse.  Appointment Scheduled:  02/19/2012 16:00:00 Appointment Scheduled Provider:  Roxy Manns [facility])

## 2012-02-19 NOTE — Telephone Encounter (Signed)
She was seen  

## 2012-02-20 ENCOUNTER — Ambulatory Visit: Payer: Medicare Other | Admitting: Dietician

## 2012-03-06 ENCOUNTER — Encounter: Payer: Medicare PPO | Attending: Family Medicine | Admitting: Dietician

## 2012-03-06 ENCOUNTER — Encounter: Payer: Self-pay | Admitting: Dietician

## 2012-03-06 VITALS — Ht 64.0 in | Wt 346.1 lb

## 2012-03-06 DIAGNOSIS — E119 Type 2 diabetes mellitus without complications: Secondary | ICD-10-CM | POA: Insufficient documentation

## 2012-03-06 DIAGNOSIS — Z713 Dietary counseling and surveillance: Secondary | ICD-10-CM | POA: Insufficient documentation

## 2012-03-06 NOTE — Progress Notes (Addendum)
Medical Nutrition Therapy:  Appt start time: 1200 end time:  1300.  Assessment:  Primary concerns today: Continued weight loss and the depression that has continued for the last 6-8 weeks.   Notes that she feels that the depression is not getting worse but it is hanging on and not getting better.  She is quite proud of herself in that she has not had a pizza in the last 2-3 weeks.  She is requesting snack ideas.  Since her last visit, she saw her MD and on 02/12/2012, her A1C was at 7.0% a drop of 0.3 point.  Since her last visit on 01/23/2012, she has lost 4.0 lbs.  This pleased her.  She noted that she had gotten through the holidays and tried to stay on her diet, but had strayed off the path at times. Today, she comes with concerns for finances.  She is finding that her health care cost are more than she Mackenzie Bonilla be able to afford.  She is about trying to cut items from her budget in order to be able to continue her therapy and regular MD appointments.  For the visit with me, I suggested that she consider coming to the Diabetes Support Group meetings.  They meet the Second Monday of every month at 6:00-7:00 PM here in this building.  She noted that this might be an option.  She is a little hesitant to drive at night.  She Mackenzie Bonilla consider coming when the daylight savings time changes and it is light later in the evening.  MEDICATIONS: Completed brief med review.  Meds unchanged.  DIETARY INTAKE: Eating patterns remain the same.  She is seeking snack suggestions that will help with weight loss.  Recent physical activity: Has not gotten into a regular exercise regimen.  Will continue to promote using the water for walking.  She is to explore the option of Silver Sneakers with her current insurance plan.  Estimated energy needs:  HT: 64 in  WT: 346.1 lb  BMI: 59.5 kg/m2  Adj WT: 192 lb (87 kg) 1400-1500 calories 165-170 g carbohydrates 110-115 g protein 40-42 g fat  Progress Towards Goal(s):  In  progress.   Nutritional Diagnosis:  [place]-2.1 Inpaired nutrition utilization As related to blood glucose.  As evidenced by new onset DM Type 2 with a current A1C at 7.0% and a history of fasting blood glucose levels > 130 mg/dl..    Intervention:  Nutrition Review of snack suggestions .  Reminded her of the advantages of protein in limiting the increase of blood glucose.  Review of food label and the levels of sugar and fiber she is looking to find in various items.    Consider the Diabetes Support Group when it gets dark later.  Support group is free, Second Monday of every Month.  Meets in the 4th floor conference room at [facility].  6Pm-7pm.     [place] bacon is 25 calories per slice, make only the number of slices that you will eat at one meal.  With the pork chops, use a egg white, beaten to dreg prior to the planko bread crumbs, brown in the pan, then put uncovered in oven to finish cooking.  Use the snack list to make up some snack selections.  Make yourself a list of starchy sides and a list of non-starch sides.  Then, when making a recipe for meat, choose a side and plan to eat about 11/2 cup for the cooked.    Plan to to freeze  half or servings of the meat dish for later.   Handouts given during visit include:  Snack List  New list of the Non-Starchy Vegetables  Monitoring/Evaluation:  Dietary intake, exercise, and body weight to determine what she is able to afford in the way of visits.  She can e-mail and call with questions.  She will make her appointment for follow-up.

## 2012-03-06 NOTE — Patient Instructions (Addendum)
   Consider the Diabetes Support Group when it gets dark later.  Support group is free, Second Monday of every Month.  Meets in the 4th floor conference room at [facility].  6Pm-7pm.     [place] bacon is 25 calories per slice, make only the number of slices that you will eat at one meal.  With the pork chops, use a egg white, beaten to dreg prior to the planko bread crumbs, brown in the pan, then put uncovered in oven to finish cooking.  Use the snack list to make up some snack selections.  Make yourself a list of starchy sides and a list of non-starch sides.  Then, when making a recipe for meat, choose a side and plan to eat about 11/2 cup for the cooked.    Plan to to freeze half or servings of the meat dish for later.

## 2012-04-15 ENCOUNTER — Telehealth: Payer: Self-pay | Admitting: Family Medicine

## 2012-04-15 NOTE — Telephone Encounter (Signed)
Patient Information:  Caller Name: Haruna  Phone: [phone]  Patient: Mackenzie Bonilla, Mackenzie Bonilla  Gender: Female  DOB: [birth year]  Age: 47 Years  PCP: [facility])  Pregnant: No  Office Follow Up:  Does the office need to follow up with this patient?: No  Instructions For The Office: N/A   Symptoms  Reason For Call & Symptoms: Pt has had explosive diarrhea x 3-4 weeks. Pt can barely make it through a meal. She has 3-5 stools per day. Pt is also getting up in the night. No vomiting. No fever but pt maintains a stomach ache.  Reviewed Health History In EMR: Yes  Reviewed Medications In EMR: Yes  Reviewed Allergies In EMR: Yes  Reviewed Surgeries / Procedures: Yes  Date of Onset of Symptoms: 03/24/2012 OB / GYN:  LMP: Unknown  Guideline(s) Used:  Diarrhea  Disposition Per Guideline:   See Within 3 Days in Office  Reason For Disposition Reached:   Diarrhea persists > 7 days  Advice Given:  N/A  Appointment Scheduled:  04/18/2012 03:30:00 Appointment Scheduled Provider:  Roxy Manns [facility])

## 2012-04-18 ENCOUNTER — Ambulatory Visit: Payer: Self-pay | Admitting: Family Medicine

## 2012-04-21 ENCOUNTER — Telehealth: Payer: Self-pay | Admitting: Family Medicine

## 2012-04-21 DIAGNOSIS — E785 Hyperlipidemia, unspecified: Secondary | ICD-10-CM

## 2012-04-21 DIAGNOSIS — E559 Vitamin D deficiency, unspecified: Secondary | ICD-10-CM

## 2012-04-21 DIAGNOSIS — E538 Deficiency of other specified B group vitamins: Secondary | ICD-10-CM

## 2012-04-21 DIAGNOSIS — N200 Calculus of kidney: Secondary | ICD-10-CM

## 2012-04-21 DIAGNOSIS — E119 Type 2 diabetes mellitus without complications: Secondary | ICD-10-CM

## 2012-04-21 NOTE — Telephone Encounter (Signed)
Message copied by Judy Pimple on Mon Apr 21, 2012 10:12 PM ------      Message from: Alvina Chou      Created: Mon Apr 21, 2012  5:13 PM      Regarding: lab orders for Tuesday, 3.11.14       Labs for 3 month f/u appt ------

## 2012-04-22 ENCOUNTER — Other Ambulatory Visit (INDEPENDENT_AMBULATORY_CARE_PROVIDER_SITE_OTHER): Payer: Medicare PPO

## 2012-04-22 DIAGNOSIS — E119 Type 2 diabetes mellitus without complications: Secondary | ICD-10-CM

## 2012-04-22 DIAGNOSIS — E538 Deficiency of other specified B group vitamins: Secondary | ICD-10-CM

## 2012-04-22 DIAGNOSIS — E785 Hyperlipidemia, unspecified: Secondary | ICD-10-CM

## 2012-04-22 LAB — COMPREHENSIVE METABOLIC PANEL
Albumin: 3.3 g/dL — ABNORMAL LOW (ref 3.5–5.2)
BUN: 9 mg/dL (ref 6–23)
CO2: 24 mEq/L (ref 19–32)
Calcium: 8.7 mg/dL (ref 8.4–10.5)
GFR: 65.49 mL/min (ref >60.00)
Glucose, Bld: 159 mg/dL — ABNORMAL HIGH (ref 70–99)
Potassium: 4.2 mEq/L (ref 3.5–5.1)
Sodium: 142 mEq/L (ref 135–145)
Total Protein: 6.7 g/dL (ref 6.0–8.3)

## 2012-04-22 LAB — LIPID PANEL: VLDL: 57.2 mg/dL — ABNORMAL HIGH (ref 0.0–40.0)

## 2012-04-22 LAB — HEMOGLOBIN A1C: Hgb A1c MFr Bld: 6.9 % — ABNORMAL HIGH (ref 4.6–6.5)

## 2012-04-22 LAB — LDL CHOLESTEROL, DIRECT: Direct LDL: 106.1 mg/dL

## 2012-04-23 ENCOUNTER — Encounter: Payer: Self-pay | Admitting: Family Medicine

## 2012-04-23 ENCOUNTER — Ambulatory Visit (INDEPENDENT_AMBULATORY_CARE_PROVIDER_SITE_OTHER): Payer: Medicare PPO | Admitting: Family Medicine

## 2012-04-23 VITALS — BP 124/86 | HR 103 | Temp 98.5°F | Ht 64.0 in | Wt 349.5 lb

## 2012-04-23 DIAGNOSIS — R109 Unspecified abdominal pain: Secondary | ICD-10-CM | POA: Insufficient documentation

## 2012-04-23 DIAGNOSIS — I890 Lymphedema, not elsewhere classified: Secondary | ICD-10-CM

## 2012-04-23 NOTE — Patient Instructions (Addendum)
I filled out forms for diabetic shoes and lymphedema pump today  Keep working on healthy diet and also exercise for weight loss  Shirlee Limerick will call you about referrals  Follow up in 6 months with labs prior

## 2012-04-23 NOTE — Progress Notes (Signed)
Subjective:    Patient ID: Mackenzie Bonilla, female    DOB: 1965-03-06, 47 y.o.   MRN: [id number]  HPI Here for f/u of chronic health problems   Wt is up 3 lb with bmi of 59.9  Diabetes Home sugar results -not checking DM diet - had education, but diet was worse since the bad weather happened Also more depression in winter -- good and bad times with it  Exercise -no exercise , not motivated  Symptoms -not overly thirsty or urinating a lot  She does try to get a good water intake  A1C last  Lab Results  Component Value Date   HGBA1C 6.9* 04/22/2012    No problems with medications  Renal protection Last eye exam - is due for that -- we need to refer   B12 level is ok with current shots Lab Results  Component Value Date   VITAMINB12 412 04/22/2012    Lipid Lab Results  Component Value Date   CHOL 188 04/22/2012   CHOL 210* 02/11/2012   CHOL 185 11/14/2011   Lab Results  Component Value Date   HDL 37.90* 04/22/2012   HDL 42.10 02/11/2012   HDL 42.80 11/14/2011   Lab Results  Component Value Date   LDLCALC 44 06/17/2009   LDLCALC 94 08/23/2008   LDLCALC 68 05/17/2008   Lab Results  Component Value Date   TRIG 286.0* 04/22/2012   TRIG 280.0* 02/11/2012   TRIG 267.0* 11/14/2011   Lab Results  Component Value Date   CHOLHDL 5 04/22/2012   CHOLHDL 5 02/11/2012   CHOLHDL 4 11/14/2011   Lab Results  Component Value Date   LDLDIRECT 106.1 04/22/2012   LDLDIRECT 132.9 02/11/2012   LDLDIRECT 103.7 11/14/2011   some improvement in cholesterol  Trying to order better groceries  She stopped her nutritional counseling right now due to finances   Needs order for lymphedema pump Has had lymphedema in both feet since age 74 - and this was confirmed by surgical procedure that showed poor lymphatic flow in both Has had to wear special shoes ever since  Is morbidly obese She had lymphedema pump in childhood and it really helped   Needs order for DM shoes She has diabetes - and  I am caring for her diabetes and is seen today  No hx of amputation, nad no hx of ulcer or pre ulcerative callus DM neuropathy- does have tingling in big toes only (not numbness  Vascular status Has had toenail loss , has had plantar fasciitis in the past  Has neuropathy in both great toes - but not other areas  Has chronic foot pain and sees podiatry  Patient Active Problem List  Diagnosis  . Type 2 diabetes, controlled, with neuropathy  . POLYCYSTIC OVARIAN DISEASE  . VITAMIN B12 DEFICIENCY  . VITAMIN D DEFICIENCY  . HYPERLIPIDEMIA  . OBESITY, MORBID  . ANXIETY  . PANIC DISORDER  . BULIMIA  . DEPRESSION  . CARPAL TUNNEL SYNDROME  . LYMPHEDEMA  . ALLERGIC RHINITIS  . ASTHMA, MILD, INTERMITTENT  . TMJ SYNDROME  . GERD  . IRRITABLE BOWEL SYNDROME  . HOT FLASHES  . OSTEOARTHRITIS  . FIBROMYALGIA  . CHRONIC FATIGUE SYNDROME  . HYPERHIDROSIS  . COUGH, CHRONIC  . DIVERTICULITIS, HX OF  . Uric acid kidney stone  . Abdominal pain, unspecified site   Past Medical History  Diagnosis Date  . Allergy     allergic rhinitis  . Anxiety   . Depression   .  Hyperlipidemia   . Arthritis     osteoarthritis  . Obesity   . Lymphedema   . Tachycardia   . Neuromuscular disorder     fibromyalgia  . Hyperhidrosis   . GERD (gastroesophageal reflux disease)     EGD negative 06/2001// EGD erythematous mucosa, polyp 08/2008  . Fatty liver 07/2008    abd. ultrasound - fatty liver ; slt dilated cbd (no stones ) 06/10// abd.  ultrasound normal on 04/2006  . Diabetes mellitus without complication    Past Surgical History  Procedure Laterality Date  . Tonsillectomy    . Septoplasty    . Foot surgery    . Uterine tumor  09/2001  . Vagus nerve stimulator insertion    . Endometrial biopsy  01/2004  . Uterine fibroid surgery  06/2005    ablation   History  Substance Use Topics  . Smoking status: Never Smoker   . Smokeless tobacco: Not on file  . Alcohol Use: No   Family History   Problem Relation Age of Onset  . Hypertension Father   . Cancer Father     pancreatic cancer  . Hypertension Sister   . Heart disease Maternal Grandmother 60    MI  . Heart disease Paternal Grandmother 63    MI   Allergies  Allergen Reactions  . Cephalexin   . Codeine     REACTION: ? reaction  . Duloxetine   . Hydrocodone     REACTION: ? reaction  . Levaquin (Levofloxacin) Nausea Only  . Metformin And Related     GI side effects   . Norelgestromin-Eth Estradiol   . Prednisone     REACTION: ? reaction  . Quetiapine   . Sertraline Hcl     REACTION: vivid dreams  . Triazolam     REACTION: ? reaction  . Zaleplon   . Zocor (Simvastatin - High Dose)     achey  . Zolpidem Tartrate     REACTION: ? reaction   Current Outpatient Prescriptions on File Prior to Visit  Medication Sig Dispense Refill  . acetaminophen (TYLENOL) 500 MG tablet Take 500 mg by mouth every 6 (six) hours as needed. OTC as directed       . allopurinol (ZYLOPRIM) 100 MG tablet daily.      Marland Kitchen ALPRAZolam (XANAX) 1 MG tablet Take 2 mg by mouth 3 (three) times daily as needed.       Marland Kitchen aluminum chloride (DRYSOL) 20 % external solution Apply topically at bedtime. Apply to affected areas at bedtime - then wash off in the AM once sweating improves - can use twice weekly       . benzonatate (TESSALON) 200 MG capsule Take 1 capsule (200 mg total) by mouth 3 (three) times daily as needed for cough.  30 capsule  1  . buPROPion (WELLBUTRIN XL) 150 MG 24 hr tablet 300 mg daily.       . calcium carbonate (TUMS - DOSED IN MG ELEMENTAL CALCIUM) 500 MG chewable tablet Chew 1 tablet by mouth as needed.      . Cholecalciferol (VITAMIN D) 1000 UNITS capsule Take 1,000 Units by mouth daily.        . CloNIDine HCl 0.1 MG TB12 Take by mouth 2 (two) times daily. Take 1 tablet by mouth twice a day       . cyanocobalamin (,VITAMIN B-12,) 1000 MCG/ML injection Inject 1,000 mcg into the muscle once. 1 ml (1000 mcg) IM as directed every  month       . cyclobenzaprine (FLEXERIL) 5 MG tablet Take 1 tablet (5 mg total) by mouth 3 (three) times daily as needed for muscle spasms.  30 tablet  1  . desoximetasone (TOPICORT) 0.25 % cream Apply topically as needed. Apply as directed      . dimenhyDRINATE (DRAMAMINE) 50 MG tablet Take 50 mg by mouth every 8 (eight) hours as needed.      Marland Kitchen FLUoxetine (PROZAC) 40 MG capsule Take 80 mg by mouth daily.       Marland Kitchen guaifenesin (REFENESEN 400) 400 MG TABS Take 400 mg by mouth every 4 (four) hours as needed. OTC as directed      . ketoconazole (NIZORAL) 2 % cream Apply topically daily. Apply to affected area once daily for 2 weeks       . naphazoline-glycerin (CLEAR EYES) 0.012-0.2 % SOLN Place 1-2 drops into both eyes every 4 (four) hours as needed. OTC as directed       . norethindrone-ethinyl estradiol (GILDESS 1/20) 1-20 MG-MCG tablet Take 1 tablet by mouth daily.      Marland Kitchen nystatin (NYSTOP) 100000 UNIT/GM POWD Apply topically 2 (two) times daily as needed. Apply to affected area two times a day until clear asneeded       . omeprazole (PRILOSEC) 40 MG capsule Take 1 capsule (40 mg total) by mouth 2 (two) times daily.  60 capsule  11  . oxybutynin (DITROPAN XL) 15 MG 24 hr tablet daily.      . traMADol (ULTRAM) 50 MG tablet Take 50 mg by mouth every 6 (six) hours as needed.      . traZODone (DESYREL) 50 MG tablet Take 6 tablets  By mouth at bedtime. But can take up to 9 tablets daily        No current facility-administered medications on file prior to visit.      Review of Systems Review of Systems  Constitutional: Negative for fever, appetite change,  and unexpected weight change. pos for chronic fatigue Eyes: Negative for pain and visual disturbance.  Respiratory: Negative for cough and shortness of breath.   Cardiovascular: Negative for cp or palpitations    Gastrointestinal: Negative for  diarrhea and constipation. pos for chronic/ constant abdominal pain all over and nausea Genitourinary:  Negative for urgency and frequency.  Skin: Negative for pallor or rash   MSK pos for chronic widespread pain and also oa Neurological: Negative for weakness, light-headedness, numbness and headaches.  Hematological: Negative for adenopathy. Does not bruise/bleed easily.  Psychiatric/Behavioral: pos for chronic depression and anxiety       Objective:   Physical Exam  Constitutional: She appears well-developed and well-nourished. No distress.  mobidly obese and well appearing   HENT:  Head: Normocephalic and atraumatic.  Mouth/Throat: Oropharynx is clear and moist.  Eyes: Conjunctivae and EOM are normal. Pupils are equal, round, and reactive to light. Right eye exhibits no discharge. Left eye exhibits no discharge. No scleral icterus.  Neck: Normal range of motion. Neck supple. No thyromegaly present.  Cardiovascular: Normal rate and regular rhythm.   Pulmonary/Chest: Effort normal and breath sounds normal. No respiratory distress. She has no wheezes.  Abdominal: Soft. Bowel sounds are normal. She exhibits no distension and no mass. There is no tenderness.  Musculoskeletal: She exhibits edema.  Lymphedema bilat lower legs below the knee without drainage or skin interruption    Lymphadenopathy:    She has no cervical adenopathy.  Neurological: She is alert. She has  normal reflexes. No cranial nerve deficit. She exhibits normal muscle tone. Coordination normal.  Skin: Skin is warm and dry. No rash noted. No erythema. No pallor.  Psychiatric: She has a normal mood and affect.          Assessment & Plan:

## 2012-04-24 NOTE — Assessment & Plan Note (Signed)
Discussed how this problem influences overall health and the risks it imposes  Reviewed plan for weight loss with lower calorie diet (via better food choices and also portion control or program like weight watchers) and exercise building up to or more than 30 minutes 5 days per week including some aerobic activity   Pt understands this but states she is not motivated to loose wt at this time

## 2012-04-24 NOTE — Assessment & Plan Note (Signed)
Disc goals for lipids and reasons to control them Rev labs with pt Rev low sat fat diet in detail   

## 2012-04-24 NOTE — Assessment & Plan Note (Signed)
Again stressed imp of diet and wt loss - but pt is not motivated (she blames depression and medical problems) , and states she cannot afford continued dietary counseling  Ref for annual eye exam  Rev labs

## 2012-04-24 NOTE — Assessment & Plan Note (Signed)
Ongoing since age 47 Filled out forms for lymphedema pump she requested - this has been helpful in the past

## 2012-04-24 NOTE — Assessment & Plan Note (Signed)
Level in tx range with current supplementation

## 2012-04-24 NOTE — Assessment & Plan Note (Signed)
Acute on chronic-pt mentions this on the way out -has had this and nausea for years - also has never had colonoscopy She is interested in GI ref- that was done

## 2012-05-07 ENCOUNTER — Telehealth: Payer: Self-pay

## 2012-05-07 ENCOUNTER — Other Ambulatory Visit: Payer: Medicare Other

## 2012-05-07 NOTE — Telephone Encounter (Signed)
Mackenzie Bonilla with [employer] medical request office notes to support lymphedema pump faxed to [phone] atten: Mackenzie Bonilla.

## 2012-05-07 NOTE — Telephone Encounter (Signed)
Office notes faxed 

## 2012-05-13 ENCOUNTER — Telehealth: Payer: Self-pay

## 2012-05-13 NOTE — Telephone Encounter (Signed)
Let pt know her request was denied - and I do not think there is anything I can do to get it approved, unfortunately

## 2012-05-13 NOTE — Telephone Encounter (Signed)
Pt.notified

## 2012-05-13 NOTE — Telephone Encounter (Signed)
Left voicemail requesting pt to call office 

## 2012-05-13 NOTE — Telephone Encounter (Signed)
Dondra Spry nurse clinical advisor with [facility] left v/m re: pneumatic compression device and stockings for that. Not approved as not medically necessary. Dr Milinda Antis can do pier to pier review if call bu 05/14/12 9 central standard time.

## 2012-05-14 ENCOUNTER — Ambulatory Visit: Payer: Medicare Other | Admitting: Family Medicine

## 2012-05-20 ENCOUNTER — Telehealth: Payer: Self-pay | Admitting: Family Medicine

## 2012-05-20 NOTE — Telephone Encounter (Signed)
Sounds like we probably need to do a prior auth--- please call for one  Ask pt what other meds and doses she has taken for this in the past - and what did not work or did not agree with her so I can fill it out thanks

## 2012-05-20 NOTE — Telephone Encounter (Signed)
Caller: Murline/Patient; Phone: [phone]; Reason for Call: Patient went to pick up her Prilosec on 05/02/12.  Pharmacy was only able to give her half of the total prescription.  She is supposed to take 40mg  twice a day.  Her insurance will only cover 20mg  twice a day.  They will only cover 60 pills for the patient at this time.  In order to purchase the other half of the month's supply will be approximately $30.00 which the patient is not able to do.  Please contact patient with instructions on what to do when the prescription runs out.  Thanks.

## 2012-05-27 NOTE — Telephone Encounter (Signed)
Pt called for status of getting more Prilosec; pt has taken Nexium in past and it worked well but was too expensive. Pt said Necium and Prilosec are only 2 meds pt has taken for this.

## 2012-05-29 MED ORDER — OMEPRAZOLE 40 MG PO CPDR: 40.0000 mg | DELAYED_RELEASE_CAPSULE | Freq: Two times a day (BID) | ORAL | Status: DC

## 2012-05-29 NOTE — Telephone Encounter (Signed)
Rx didn't need a prior auth we had to do a quantity override. I did that over the phone with pt's insurance and they said it was approved for pt to get Prilosec 40 mg BID, and gave me a reference # [id number] if there was any problems. I left a detailed voicemail letting pt know insurance said that med was approved and gave her the reference # and advise pt to call pharmacy and request refill of med

## 2012-07-04 ENCOUNTER — Ambulatory Visit: Payer: Medicare PPO | Admitting: *Deleted

## 2012-07-30 ENCOUNTER — Encounter: Payer: Self-pay | Admitting: Family Medicine

## 2012-07-30 ENCOUNTER — Ambulatory Visit (INDEPENDENT_AMBULATORY_CARE_PROVIDER_SITE_OTHER): Payer: Medicare PPO | Admitting: Family Medicine

## 2012-07-30 VITALS — BP 118/78 | HR 88 | Temp 97.9°F | Wt 333.8 lb

## 2012-07-30 DIAGNOSIS — J45909 Unspecified asthma, uncomplicated: Secondary | ICD-10-CM

## 2012-07-30 MED ORDER — ALBUTEROL SULFATE HFA 108 (90 BASE) MCG/ACT IN AERS: 2.0000 | INHALATION_SPRAY | Freq: Four times a day (QID) | RESPIRATORY_TRACT | Status: DC | PRN

## 2012-07-30 NOTE — Progress Notes (Addendum)
  Subjective:    Patient ID: Mackenzie Bonilla, female    DOB: January 31, 1966, 47 y.o.   MRN: [id number]  HPI CC: asthma flare   47 yo patient of Dr. Royden Purl who has h/o mild intermittent asthma, allergic rhinitis, DM, GERD, and morbid obesity presents today with concerns for asthma acting up.  Progressively worsening trouble with dyspnea, wheezing over last 2-3 months.  Last 4 days much worse.  Cough productive of thick mucous.  Feels hot weather/humidity has worsened asthma.  Cold weather is another trigger.  Outdoor exposure is another trigger.  Prior on alb inhaler, but hasn't used recently. Waking up at night coughing.   No fevers/chills, headaches, ear or tooth pain, sinus congestion. No smokers at home.  No sick contacts at home.  Sunday went to park - trouble walking down sidewalk 2/2 dyspnea.  Hasn't tried anything for breathing so far.  Allergic rhinitis - allergies to weeds, mold, dust, grasses and trees. Currently not on asthma/allergy controller medication.   Past Medical History  Diagnosis Date  . Allergy     allergic rhinitis  . Anxiety   . Depression   . Hyperlipidemia   . Arthritis     osteoarthritis  . Obesity   . Lymphedema   . Tachycardia   . Neuromuscular disorder     fibromyalgia  . Hyperhidrosis   . GERD (gastroesophageal reflux disease)     EGD negative 06/2001// EGD erythematous mucosa, polyp 08/2008  . Fatty liver 07/2008    abd. ultrasound - fatty liver ; slt dilated cbd (no stones ) 06/10// abd.  ultrasound normal on 04/2006  . Diabetes mellitus without complication      Review of Systems Per HPI    Objective:   Physical Exam  Nursing note and vitals reviewed. Constitutional: She appears well-developed and well-nourished. No distress.  Morbid obesity  HENT:  Head: Normocephalic and atraumatic.  Right Ear: Hearing, tympanic membrane and external ear normal.  Left Ear: Hearing, tympanic membrane, external ear and ear canal normal.  Nose: No  mucosal edema or rhinorrhea. Right sinus exhibits no maxillary sinus tenderness and no frontal sinus tenderness. Left sinus exhibits no maxillary sinus tenderness and no frontal sinus tenderness.  Mouth/Throat: Uvula is midline, oropharynx is clear and moist and mucous membranes are normal. No oropharyngeal exudate, posterior oropharyngeal edema, posterior oropharyngeal erythema or tonsillar abscesses.  R canal full of soft cerumen   Eyes: Conjunctivae and EOM are normal. Pupils are equal, round, and reactive to light. No scleral icterus.  Neck: Normal range of motion. Neck supple.  Cardiovascular: Normal rate, regular rhythm, normal heart sounds and intact distal pulses.   No murmur heard. Pulmonary/Chest: Effort normal and breath sounds normal. No respiratory distress. She has no wheezes. She has no rales.  diminished breath sounds  Lymphadenopathy:    She has no cervical adenopathy.  Skin: Skin is warm and dry. No rash noted.       Assessment & Plan:

## 2012-07-30 NOTE — Assessment & Plan Note (Addendum)
Anticipate reactive airway flare due to triggers of humidity/weather. Restart albuterol inhaler regularly for next 3-5 days, consider addition of advair if not improving (sample inhaler 115/21 provided today - pt does not do well with DPI). Noted allergy to prednisone - pt unsure what reaction was.  expected PEF for age and height = 420. Actual PEF = 360 which is 86% anticipated.

## 2012-07-30 NOTE — Patient Instructions (Signed)
I think you have reactive airway flare due to allergies and weather/humidity. Let's treat with albuterol inhaler scheduled for the next 3-5 days (every 6 hours).  If not better with this, start advair (sample provided today). Let us know if not improving as expected, return if any fever >101 or worsening productive cough or other concerns. Good to see you today, call us with quesitons.

## 2012-08-08 ENCOUNTER — Ambulatory Visit: Payer: Medicare PPO | Admitting: *Deleted

## 2012-08-12 ENCOUNTER — Ambulatory Visit: Payer: Self-pay | Admitting: Emergency Medicine

## 2012-08-12 HISTORY — PX: COLONOSCOPY: SHX174

## 2012-08-18 ENCOUNTER — Ambulatory Visit: Payer: Self-pay | Admitting: Emergency Medicine

## 2012-08-21 ENCOUNTER — Encounter: Payer: Self-pay | Admitting: Family Medicine

## 2012-08-21 ENCOUNTER — Encounter: Payer: Self-pay | Admitting: Emergency Medicine

## 2012-10-08 ENCOUNTER — Other Ambulatory Visit: Payer: Self-pay | Admitting: Family Medicine

## 2012-10-08 NOTE — Telephone Encounter (Signed)
done

## 2012-10-08 NOTE — Telephone Encounter (Signed)
Please refill 30 with 1 refil -thanks

## 2012-10-08 NOTE — Telephone Encounter (Signed)
Electronic refill request, please advise  

## 2012-11-04 ENCOUNTER — Telehealth: Payer: Self-pay

## 2012-11-04 NOTE — Telephone Encounter (Signed)
Have her f/u when able so we can discuss each med and where she was in terms of specialist follow up so we can get a plan going - since these will be new meds/ issues for me to take on

## 2012-11-04 NOTE — Telephone Encounter (Signed)
Pt wanting Dr Milinda Antis to begin to fill allopurinol 100 mg taking one daily to control uric acid and oxybutynin 15 mg taking one daily per Dr Achilles Dunk urologist. Also junel Fe 1/20 taking one daily per Dr Vincente Poli. [facility] pharmacy. Pt said difficult for her to pay so many copays to specialist. Pt does not need refills now but wants to know if Dr Milinda Antis would agree to take over these meds for future refills. Pt request cb.

## 2012-11-05 NOTE — Telephone Encounter (Signed)
Pt advise to scheduled f/u to discuss meds/issues with Dr. Milinda Antis, pt wasn't able to schedule a f/u now but said she will call back to schedule a f/u

## 2012-11-19 ENCOUNTER — Encounter: Payer: Self-pay | Admitting: Family Medicine

## 2012-11-19 ENCOUNTER — Ambulatory Visit (INDEPENDENT_AMBULATORY_CARE_PROVIDER_SITE_OTHER): Payer: Medicare PPO | Admitting: Family Medicine

## 2012-11-19 VITALS — BP 122/82 | HR 115 | Temp 98.7°F | Ht 64.0 in | Wt 353.5 lb

## 2012-11-19 DIAGNOSIS — F3289 Other specified depressive episodes: Secondary | ICD-10-CM

## 2012-11-19 DIAGNOSIS — F411 Generalized anxiety disorder: Secondary | ICD-10-CM

## 2012-11-19 DIAGNOSIS — Z79899 Other long term (current) drug therapy: Secondary | ICD-10-CM

## 2012-11-19 DIAGNOSIS — Z23 Encounter for immunization: Secondary | ICD-10-CM

## 2012-11-19 DIAGNOSIS — N200 Calculus of kidney: Secondary | ICD-10-CM

## 2012-11-19 DIAGNOSIS — E282 Polycystic ovarian syndrome: Secondary | ICD-10-CM

## 2012-11-19 DIAGNOSIS — F329 Major depressive disorder, single episode, unspecified: Secondary | ICD-10-CM

## 2012-11-19 MED ORDER — ALLOPURINOL 100 MG PO TABS: 100.0000 mg | ORAL_TABLET | Freq: Every day | ORAL | Status: DC

## 2012-11-19 MED ORDER — TRAMADOL HCL 50 MG PO TABS: 50.0000 mg | ORAL_TABLET | Freq: Three times a day (TID) | ORAL | Status: DC | PRN

## 2012-11-19 MED ORDER — CYCLOBENZAPRINE HCL 5 MG PO TABS: 5.0000 mg | ORAL_TABLET | Freq: Two times a day (BID) | ORAL | Status: DC | PRN

## 2012-11-19 MED ORDER — OXYBUTYNIN CHLORIDE ER 15 MG PO TB24: 15.0000 mg | ORAL_TABLET | Freq: Every day | ORAL | Status: DC

## 2012-11-19 MED ORDER — GLYCOPYRROLATE 1 MG PO TABS: ORAL_TABLET | ORAL | Status: DC

## 2012-11-19 NOTE — Progress Notes (Signed)
Subjective:    Patient ID: Mackenzie Bonilla, female    DOB: [birth year], 47 y.o.   MRN: [id number]  HPI Here to review medicines and see what I can poss take over for specialists in light of lack of money for copay visits   Has medicines for different conditions- some for specialists   Dr Cecelia Byars - GI- his practice closed - waiting for a new doctor  She is on robinul- for spasms in intestines - takes it for abdominal cramping - takes it 1-2 times per day  Formal GI dx: include IBS and GERD  Did not make f/u  She had her colonosc  Dr Delle Reining - takes care of psyche meds and will continue that   Dr Vincente Poli 5/13 - she was doing an ultrasound and endo bx  Has also had an ablation  Was ok  Has not seen her this year  ? Does ultrasound every year or not that often  Takes the OC continuously - and does not have a period   Dr Achilles Dunk for urologist Hx of kidney stones - well over a year since last kidney stone that passed  She has uric acid stones and allopuinol has helped that  Last visit 4/13 that we have a note for (Poss a visit since then)  Not doing routine scanning  allupurinol for prev of uric acid stones  Also oxybutinin for her overactive bladder -takes it as needed   Patient Active Problem List   Diagnosis Date Noted  . Abdominal pain, unspecified site 04/23/2012  . Uric acid kidney stone 11/20/2011  . HYPERHIDROSIS 11/07/2009  . HOT FLASHES 06/17/2009  . Type 2 diabetes, controlled, with neuropathy 08/23/2008  . VITAMIN D DEFICIENCY 05/07/2008  . VITAMIN B12 DEFICIENCY 06/04/2007  . CHRONIC FATIGUE SYNDROME 11/15/2006  . DIVERTICULITIS, HX OF 11/15/2006  . POLYCYSTIC OVARIAN DISEASE 10/16/2006  . HYPERLIPIDEMIA 10/16/2006  . OBESITY, MORBID 10/16/2006  . ANXIETY 10/16/2006  . PANIC DISORDER 10/16/2006  . BULIMIA 10/16/2006  . DEPRESSION 10/16/2006  . CARPAL TUNNEL SYNDROME 10/16/2006  . LYMPHEDEMA 10/16/2006  . ALLERGIC RHINITIS 10/16/2006  . ASTHMA, MILD, INTERMITTENT  10/16/2006  . TMJ SYNDROME 10/16/2006  . GERD 10/16/2006  . IRRITABLE BOWEL SYNDROME 10/16/2006  . OSTEOARTHRITIS 10/16/2006  . FIBROMYALGIA 10/16/2006  . COUGH, CHRONIC 10/16/2006   Past Medical History  Diagnosis Date  . Allergy     allergic rhinitis  . Anxiety   . Depression   . Hyperlipidemia   . Arthritis     osteoarthritis  . Obesity   . Lymphedema   . Tachycardia   . Neuromuscular disorder     fibromyalgia  . Hyperhidrosis   . GERD (gastroesophageal reflux disease)     EGD negative 06/2001// EGD erythematous mucosa, polyp 08/2008  . Fatty liver 07/2008    abd. ultrasound - fatty liver ; slt dilated cbd (no stones ) 06/10// abd.  ultrasound normal on 04/2006  . Diabetes mellitus without complication    Past Surgical History  Procedure Laterality Date  . Tonsillectomy    . Septoplasty    . Foot surgery    . Uterine tumor  09/2001  . Vagus nerve stimulator insertion    . Endometrial biopsy  01/2004  . Uterine fibroid surgery  06/2005    ablation   History  Substance Use Topics  . Smoking status: Never Smoker   . Smokeless tobacco: Not on file  . Alcohol Use: No   Family History  Problem Relation Age  of Onset  . Hypertension Father   . Cancer Father     pancreatic cancer  . Hypertension Sister   . Heart disease Maternal Grandmother 60    MI  . Heart disease Paternal Grandmother 55    MI   Allergies  Allergen Reactions  . Cephalexin   . Codeine     REACTION: ? reaction  . Duloxetine   . Hydrocodone     REACTION: ? reaction  . Levaquin [Levofloxacin] Nausea Only  . Metformin And Related     GI side effects   . Norelgestromin-Eth Estradiol   . Prednisone     REACTION: ? reaction  . Quetiapine   . Sertraline Hcl     REACTION: vivid dreams  . Triazolam     REACTION: ? reaction  . Zaleplon   . Zocor [Simvastatin - High Dose]     achey  . Zolpidem Tartrate     REACTION: ? reaction   Current Outpatient Prescriptions on File Prior to Visit   Medication Sig Dispense Refill  . acetaminophen (TYLENOL) 500 MG tablet Take 500 mg by mouth every 6 (six) hours as needed. OTC as directed       . ALPRAZolam (XANAX) 1 MG tablet Take 2 mg by mouth 3 (three) times daily as needed.       Marland Kitchen aluminum chloride (DRYSOL) 20 % external solution Apply topically at bedtime. Apply to affected areas at bedtime - then wash off in the AM once sweating improves - can use twice weekly       . benzonatate (TESSALON) 200 MG capsule Take 1 capsule (200 mg total) by mouth 3 (three) times daily as needed for cough.  30 capsule  1  . buPROPion (WELLBUTRIN XL) 150 MG 24 hr tablet 450 mg daily.       . calcium carbonate (TUMS - DOSED IN MG ELEMENTAL CALCIUM) 500 MG chewable tablet Chew 1 tablet by mouth as needed.      . Cholecalciferol (VITAMIN D) 1000 UNITS capsule Take 1,000 Units by mouth daily.        . CloNIDine HCl 0.1 MG TB12 Take by mouth 2 (two) times daily. Take 1 tablet by mouth twice a day       . cyanocobalamin (,VITAMIN B-12,) 1000 MCG/ML injection Inject 1,000 mcg into the muscle once. 1 ml (1000 mcg) IM as directed every month       . desoximetasone (TOPICORT) 0.25 % cream Apply topically as needed. Apply as directed      . dimenhyDRINATE (DRAMAMINE) 50 MG tablet Take 50 mg by mouth every 8 (eight) hours as needed.      Marland Kitchen FLUoxetine (PROZAC) 40 MG capsule Take 80 mg by mouth daily.       Marland Kitchen guaifenesin (REFENESEN 400) 400 MG TABS Take 400 mg by mouth every 4 (four) hours as needed. OTC as directed      . ketoconazole (NIZORAL) 2 % cream Apply topically daily. Apply to affected area once daily for 2 weeks       . naphazoline-glycerin (CLEAR EYES) 0.012-0.2 % SOLN Place 1-2 drops into both eyes every 4 (four) hours as needed. OTC as directed       . norethindrone-ethinyl estradiol (GILDESS 1/20) 1-20 MG-MCG tablet Take 1 tablet by mouth daily.      Marland Kitchen nystatin (NYSTOP) 100000 UNIT/GM POWD Apply topically 2 (two) times daily as needed. Apply to affected area  two times a day until clear asneeded       .  omeprazole (PRILOSEC) 40 MG capsule Take 1 capsule (40 mg total) by mouth 2 (two) times daily.  60 capsule  5  . talc (ZEASORB) powder Apply topically as needed.      . traZODone (DESYREL) 50 MG tablet Take 6 tablets  By mouth at bedtime. But can take up to 9 tablets daily       . [DISCONTINUED] oxybutynin (DITROPAN XL) 15 MG 24 hr tablet daily.       No current facility-administered medications on file prior to visit.    Review of Systems Review of Systems  Constitutional: Negative for fever, appetite change,  and unexpected weight change. pos for chronic fatigue Eyes: Negative for pain and visual disturbance.  Respiratory: Negative for cough and shortness of breath.   Cardiovascular: Negative for cp or palpitations    Gastrointestinal: pos for IBS with constipation/ diarrhea and abd cramping, pos for gerd .  Genitourinary: pos for urgency and frequency. (baseline) MSK pos for aches and pains/ fibromyalgia-no joint changes  Skin: Negative for pallor or rash   Neurological: Negative for weakness, light-headedness, numbness and headaches.  Hematological: Negative for adenopathy. Does not bruise/bleed easily.  Psychiatric/Behavioral: pos for depression and anxiety without SI         Objective:   Physical Exam  Constitutional: She appears well-developed and well-nourished. No distress.  Morbidly obese and well app  HENT:  Head: Normocephalic and atraumatic.  Eyes: Conjunctivae and EOM are normal. Pupils are equal, round, and reactive to light.  Musculoskeletal: She exhibits no tenderness.  Neurological: She is alert.  No tremor today  Skin: Skin is warm and dry. No rash noted. No erythema. No pallor.  Psychiatric: Her speech is normal. Thought content normal. Her mood appears anxious. Her affect is blunt. She is slowed. She exhibits a depressed mood.          Assessment & Plan:

## 2012-11-19 NOTE — Patient Instructions (Signed)
Take care of yourself  Stay active and this should help the fibromyalgia  Glad you had your flu shot

## 2012-11-20 DIAGNOSIS — Z79899 Other long term (current) drug therapy: Secondary | ICD-10-CM | POA: Insufficient documentation

## 2012-11-20 NOTE — Assessment & Plan Note (Signed)
Has not had any kidney stone activity in over a year On allopurinol to prevent stone formation  Also ditropan for her overactive bladder  I will take over these px for Dr Isabel Caprice as long as she is stable - but in event of stone passage or other problem-she will return to him

## 2012-11-20 NOTE — Assessment & Plan Note (Signed)
Spent a prolonged period of time disc all her medications- rev the reasons for each and poss side eff- and disc which ones I could take over from specialist care  >25 min spent with face to face with patient, >50% counseling and/or coordinating care

## 2012-11-20 NOTE — Assessment & Plan Note (Signed)
This continues to worsen as pt has less and less control over compulsive eating  As usual- I stressed she address this with her mental health professionals and recommended lifestyle change

## 2012-11-20 NOTE — Assessment & Plan Note (Signed)
Pt's anxiety and depression are severe and diff to control It was agreed that she will continue to see Dr Evelene Croon for tx of these problems No SI

## 2012-11-20 NOTE — Assessment & Plan Note (Signed)
Pt will continue to see Dr Vincente Poli for gyn - for this and annual exams as well as to watch her endometrial lining in light of morbid obesity and irreg menses in the past (s/p ablation) Disc menopausal expectations

## 2012-12-05 ENCOUNTER — Encounter: Payer: Self-pay | Admitting: Family Medicine

## 2012-12-05 ENCOUNTER — Ambulatory Visit (INDEPENDENT_AMBULATORY_CARE_PROVIDER_SITE_OTHER): Payer: Medicare PPO | Admitting: Family Medicine

## 2012-12-05 VITALS — BP 132/70 | HR 94 | Temp 98.3°F

## 2012-12-05 DIAGNOSIS — J069 Acute upper respiratory infection, unspecified: Secondary | ICD-10-CM

## 2012-12-05 MED ORDER — BENZONATATE 200 MG PO CAPS: 200.0000 mg | ORAL_CAPSULE | Freq: Three times a day (TID) | ORAL | Status: DC | PRN

## 2012-12-05 NOTE — Patient Instructions (Signed)
I think you have a viral upper respiratory infection (cold) with cough  Drink lots of fluids  Use nasal saline spray as tolerated  Breathe hot steam in shower  Acetaminophen (tylenol ) for aches or fever  Sleep in recliner or prop up if congested  Stop loratadine (Claritin) , and try zyrtec instead (10 mg one pill daily- generic or store brand is fine)  For cough - try the tessalon  Update if not starting to improve in a week or if worsening

## 2012-12-05 NOTE — Progress Notes (Signed)
Subjective:    Patient ID: Mackenzie Bonilla, female    DOB: 08/31/1965, 47 y.o.   MRN: [id number]  HPI Here with uri symptoms   Last night - got a headache and congestion and became dizzy  She got up and laid down on the couch- could not sleep  Also her GERD acted up and she took tums  Congestion started in her chest - and cough became productive over the night- making her nauseated  ? Post nasal drip  Sinus pain- under her eyes and her R ear hurts too   Mucous - ? If color or not  Unsure if she has had a fever - head got hot today (also hot flashes)   No cold medicines   claritin does not work well anymore (loratidine)  Patient Active Problem List   Diagnosis Date Noted  . Medication management 11/20/2012  . Abdominal pain, unspecified site 04/23/2012  . Uric acid kidney stone 11/20/2011  . HYPERHIDROSIS 11/07/2009  . HOT FLASHES 06/17/2009  . Type 2 diabetes, controlled, with neuropathy 08/23/2008  . VITAMIN D DEFICIENCY 05/07/2008  . VITAMIN B12 DEFICIENCY 06/04/2007  . CHRONIC FATIGUE SYNDROME 11/15/2006  . DIVERTICULITIS, HX OF 11/15/2006  . POLYCYSTIC OVARIAN DISEASE 10/16/2006  . HYPERLIPIDEMIA 10/16/2006  . OBESITY, MORBID 10/16/2006  . ANXIETY 10/16/2006  . PANIC DISORDER 10/16/2006  . BULIMIA 10/16/2006  . DEPRESSION 10/16/2006  . CARPAL TUNNEL SYNDROME 10/16/2006  . LYMPHEDEMA 10/16/2006  . ALLERGIC RHINITIS 10/16/2006  . ASTHMA, MILD, INTERMITTENT 10/16/2006  . TMJ SYNDROME 10/16/2006  . GERD 10/16/2006  . IRRITABLE BOWEL SYNDROME 10/16/2006  . OSTEOARTHRITIS 10/16/2006  . FIBROMYALGIA 10/16/2006  . COUGH, CHRONIC 10/16/2006   Past Medical History  Diagnosis Date  . Allergy     allergic rhinitis  . Anxiety   . Depression   . Hyperlipidemia   . Arthritis     osteoarthritis  . Obesity   . Lymphedema   . Tachycardia   . Neuromuscular disorder     fibromyalgia  . Hyperhidrosis   . GERD (gastroesophageal reflux disease)     EGD negative  06/2001// EGD erythematous mucosa, polyp 08/2008  . Fatty liver 07/2008    abd. ultrasound - fatty liver ; slt dilated cbd (no stones ) 06/10// abd.  ultrasound normal on 04/2006  . Diabetes mellitus without complication    Past Surgical History  Procedure Laterality Date  . Tonsillectomy    . Septoplasty    . Foot surgery    . Uterine tumor  09/2001  . Vagus nerve stimulator insertion    . Endometrial biopsy  01/2004  . Uterine fibroid surgery  06/2005    ablation   History  Substance Use Topics  . Smoking status: Never Smoker   . Smokeless tobacco: Not on file  . Alcohol Use: No   Family History  Problem Relation Age of Onset  . Hypertension Father   . Cancer Father     pancreatic cancer  . Hypertension Sister   . Heart disease Maternal Grandmother 60    MI  . Heart disease Paternal Grandmother 29    MI   Allergies  Allergen Reactions  . Cephalexin   . Codeine     REACTION: ? reaction  . Duloxetine   . Hydrocodone     REACTION: ? reaction  . Levaquin [Levofloxacin] Nausea Only  . Metformin And Related     GI side effects   . Norelgestromin-Eth Estradiol   . Prednisone  REACTION: ? reaction  . Quetiapine   . Sertraline Hcl     REACTION: vivid dreams  . Triazolam     REACTION: ? reaction  . Zaleplon   . Zocor [Simvastatin - High Dose]     achey  . Zolpidem Tartrate     REACTION: ? reaction   Current Outpatient Prescriptions on File Prior to Visit  Medication Sig Dispense Refill  . acetaminophen (TYLENOL) 500 MG tablet Take 500 mg by mouth every 6 (six) hours as needed. OTC as directed       . albuterol (PROVENTIL HFA;VENTOLIN HFA) 108 (90 BASE) MCG/ACT inhaler Inhale 2 puffs into the lungs every 4 (four) hours as needed for wheezing.      Marland Kitchen allopurinol (ZYLOPRIM) 100 MG tablet Take 1 tablet (100 mg total) by mouth daily.  30 tablet  11  . ALPRAZolam (XANAX) 1 MG tablet Take 2 mg by mouth 3 (three) times daily as needed.       Marland Kitchen aluminum chloride  (DRYSOL) 20 % external solution Apply topically at bedtime. Apply to affected areas at bedtime - then wash off in the AM once sweating improves - can use twice weekly       . benzonatate (TESSALON) 200 MG capsule Take 1 capsule (200 mg total) by mouth 3 (three) times daily as needed for cough.  30 capsule  1  . buPROPion (WELLBUTRIN XL) 150 MG 24 hr tablet 450 mg daily.       . calcium carbonate (TUMS - DOSED IN MG ELEMENTAL CALCIUM) 500 MG chewable tablet Chew 1 tablet by mouth as needed.      . Cholecalciferol (VITAMIN D) 1000 UNITS capsule Take 1,000 Units by mouth daily.        . CloNIDine HCl 0.1 MG TB12 Take by mouth 2 (two) times daily. Take 1 tablet by mouth twice a day       . cyanocobalamin (,VITAMIN B-12,) 1000 MCG/ML injection Inject 1,000 mcg into the muscle once. 1 ml (1000 mcg) IM as directed every month       . cyclobenzaprine (FLEXERIL) 5 MG tablet Take 1 tablet (5 mg total) by mouth 2 (two) times daily as needed for muscle spasms.  60 tablet  3  . desoximetasone (TOPICORT) 0.25 % cream Apply topically as needed. Apply as directed      . dimenhyDRINATE (DRAMAMINE) 50 MG tablet Take 50 mg by mouth every 8 (eight) hours as needed.      Marland Kitchen FLUoxetine (PROZAC) 40 MG capsule Take 80 mg by mouth daily.       Marland Kitchen glycopyrrolate (ROBINUL) 1 MG tablet Take 2 tablets by mouth twice daily as needed for abdominal cramping  120 tablet  5  . guaifenesin (REFENESEN 400) 400 MG TABS Take 400 mg by mouth every 4 (four) hours as needed. OTC as directed      . ketoconazole (NIZORAL) 2 % cream Apply topically daily. Apply to affected area once daily for 2 weeks       . naphazoline-glycerin (CLEAR EYES) 0.012-0.2 % SOLN Place 1-2 drops into both eyes every 4 (four) hours as needed. OTC as directed       . norethindrone-ethinyl estradiol (GILDESS 1/20) 1-20 MG-MCG tablet Take 1 tablet by mouth daily.      Marland Kitchen nystatin (NYSTOP) 100000 UNIT/GM POWD Apply topically 2 (two) times daily as needed. Apply to affected  area two times a day until clear asneeded       . omeprazole (  PRILOSEC) 40 MG capsule Take 1 capsule (40 mg total) by mouth 2 (two) times daily.  60 capsule  5  . oxybutynin (DITROPAN XL) 15 MG 24 hr tablet Take 1 tablet (15 mg total) by mouth daily.  30 tablet  11  . talc (ZEASORB) powder Apply topically as needed.      . traMADol (ULTRAM) 50 MG tablet Take 1 tablet (50 mg total) by mouth every 8 (eight) hours as needed.  60 tablet  3  . traZODone (DESYREL) 50 MG tablet Take 6 tablets  By mouth at bedtime. But can take up to 9 tablets daily       . [DISCONTINUED] oxybutynin (DITROPAN XL) 15 MG 24 hr tablet daily.       No current facility-administered medications on file prior to visit.     Review of Systems Review of Systems  Constitutional: Negative for , appetite change,  and unexpected weight change.  ENT pos for congestion/ rhinorrhea/ otalgia and ST Eyes: Negative for pain and visual disturbance.  Respiratory: Negative for wheeze and shortness of breath.   Cardiovascular: Negative for cp or palpitations    Gastrointestinal: Negative for nausea, diarrhea and constipation.  Genitourinary: Negative for urgency and frequency.  Skin: Negative for pallor or rash   Neurological: Negative for weakness, light-headedness, numbness and headaches.  Hematological: Negative for adenopathy. Does not bruise/bleed easily.  Psychiatric/Behavioral: Negative for dysphoric mood. The patient is not nervous/anxious.         Objective:   Physical Exam  Constitutional: She appears well-developed and well-nourished. No distress.  Morbidly obese and well app  HENT:  Head: Normocephalic and atraumatic.  Mouth/Throat: Oropharynx is clear and moist. No oropharyngeal exudate.  Nares are injected and congested  TMs dull  Mild maxillary sinus tenderness Clear rhinorrhea  Eyes: Conjunctivae and EOM are normal. Pupils are equal, round, and reactive to light. Right eye exhibits no discharge. Left eye  exhibits no discharge.  Eyes are watery  Neck: Normal range of motion. Neck supple.  Cardiovascular: Regular rhythm and normal heart sounds.   Pulmonary/Chest: Effort normal and breath sounds normal. No respiratory distress. She has no wheezes. She has no rales.  Musculoskeletal: She exhibits no edema.  Lymphadenopathy:    She has no cervical adenopathy.  Neurological: She is alert. She has normal reflexes.  Skin: Skin is warm and dry. No rash noted.  Psychiatric: Her mood appears anxious. She exhibits a depressed mood.  pleasant          Assessment & Plan:

## 2012-12-07 NOTE — Assessment & Plan Note (Signed)
Disc symptomatic care - see instructions on AVS Handout given  Disc use of tessalon for cough prn Update if not starting to improve in a week or if worsening

## 2013-01-19 ENCOUNTER — Other Ambulatory Visit: Payer: Self-pay | Admitting: Family Medicine

## 2013-01-20 ENCOUNTER — Ambulatory Visit (INDEPENDENT_AMBULATORY_CARE_PROVIDER_SITE_OTHER): Payer: Medicare PPO | Admitting: Podiatry

## 2013-01-20 ENCOUNTER — Encounter: Payer: Self-pay | Admitting: Podiatry

## 2013-01-20 VITALS — BP 123/88 | HR 122 | Resp 18

## 2013-01-20 DIAGNOSIS — B351 Tinea unguium: Secondary | ICD-10-CM

## 2013-01-20 DIAGNOSIS — M79609 Pain in unspecified limb: Secondary | ICD-10-CM

## 2013-01-20 DIAGNOSIS — L600 Ingrowing nail: Secondary | ICD-10-CM

## 2013-01-20 NOTE — Telephone Encounter (Signed)
done

## 2013-01-20 NOTE — Telephone Encounter (Signed)
Please refill times 3 

## 2013-01-20 NOTE — Telephone Encounter (Signed)
Electronic refill request, please advise  

## 2013-01-20 NOTE — Patient Instructions (Signed)

## 2013-01-20 NOTE — Progress Notes (Signed)
   Subjective:    Patient ID: Mackenzie Bonilla, female    DOB: 25-Mar-1965, 47 y.o.   MRN: [id number]  HPI Comments: Left great toenail lateral corner , very painful      Review of Systems     Objective:   Physical Exam        Assessment & Plan:

## 2013-01-21 NOTE — Progress Notes (Signed)
Subjective:     Patient ID: Mackenzie Bonilla, female   DOB: 07-12-1965, 47 y.o.   MRN: [id number]  HPI patient presents after not being seen for a number of years with an ingrown toenail left hallux pain in her feet in general and nail disease that she can not take care of herself with thickness and discomfort. She is obese with a long-term history of diabetes and physical ailments   Review of Systems  All other systems reviewed and are negative.       Objective:   Physical Exam  Nursing note and vitals reviewed. Constitutional: She is oriented to person, place, and time.  Cardiovascular: Intact distal pulses.   Musculoskeletal: Normal range of motion.  Neurological: She is oriented to person, place, and time.  Skin: Skin is warm.   patient is found to have incurvated hallux nail left lateral border this painful and nail disease 1-5 both feet. Neurovascular status is intact with edema of the foot and ankle both feet which is related to a obesity and pain disease most likely     Assessment:     Ingrown toenail deformity left hallux with nail disease 1-5 both feet and edema secondary to venous disease    Plan:     H&P and education rendered concerning conditions. Recommended removal of the ingrown toenail trimming of toenails along with long-term diabetic care. Explained risk and infiltrated the left hallux 60 mg Xylocaine Marcaine mixture removing the lateral border exposing matrix and applied 3 applications phenol followed by alcohol lavaged and sterile dressing. Instructed on soaks and debrided remaining nails 1-5 both feet with no iatrogenic bleeding noted

## 2013-03-04 ENCOUNTER — Other Ambulatory Visit: Payer: Self-pay | Admitting: Obstetrics and Gynecology

## 2013-03-31 ENCOUNTER — Ambulatory Visit: Payer: Medicare PPO | Admitting: Podiatry

## 2013-04-07 ENCOUNTER — Ambulatory Visit: Payer: Medicare PPO | Admitting: Podiatry

## 2013-04-17 ENCOUNTER — Ambulatory Visit: Payer: Self-pay | Admitting: Podiatry

## 2013-05-12 ENCOUNTER — Ambulatory Visit (INDEPENDENT_AMBULATORY_CARE_PROVIDER_SITE_OTHER): Payer: Medicare PPO | Admitting: Family Medicine

## 2013-05-12 ENCOUNTER — Encounter: Payer: Self-pay | Admitting: Family Medicine

## 2013-05-12 VITALS — BP 120/88 | HR 105 | Temp 98.4°F | Ht 64.0 in | Wt 340.2 lb

## 2013-05-12 DIAGNOSIS — M79609 Pain in unspecified limb: Secondary | ICD-10-CM

## 2013-05-12 DIAGNOSIS — N200 Calculus of kidney: Secondary | ICD-10-CM

## 2013-05-12 DIAGNOSIS — E1142 Type 2 diabetes mellitus with diabetic polyneuropathy: Secondary | ICD-10-CM

## 2013-05-12 DIAGNOSIS — M79676 Pain in unspecified toe(s): Secondary | ICD-10-CM | POA: Insufficient documentation

## 2013-05-12 DIAGNOSIS — J309 Allergic rhinitis, unspecified: Secondary | ICD-10-CM

## 2013-05-12 DIAGNOSIS — E1149 Type 2 diabetes mellitus with other diabetic neurological complication: Secondary | ICD-10-CM

## 2013-05-12 DIAGNOSIS — E785 Hyperlipidemia, unspecified: Secondary | ICD-10-CM

## 2013-05-12 DIAGNOSIS — E1140 Type 2 diabetes mellitus with diabetic neuropathy, unspecified: Secondary | ICD-10-CM

## 2013-05-12 LAB — URIC ACID: Uric Acid, Serum: 3.4 mg/dL (ref 2.4–7.0)

## 2013-05-12 MED ORDER — FLUTICASONE PROPIONATE 50 MCG/ACT NA SUSP: 2.0000 | Freq: Every day | NASAL | Status: DC

## 2013-05-12 MED ORDER — METFORMIN HCL 500 MG PO TABS: 500.0000 mg | ORAL_TABLET | Freq: Two times a day (BID) | ORAL | Status: DC

## 2013-05-12 NOTE — Assessment & Plan Note (Signed)
No recent stone symptoms Pt has not been compliant with allopurinol and stressed imp of this  Uric acid level today

## 2013-05-12 NOTE — Progress Notes (Signed)
Subjective:    Patient ID: Mackenzie Bonilla, female    DOB: 1965/11/13, 48 y.o.   MRN: [id number]  HPI Here for pain in both big toes  Started several months ago - pretty much just the 2 big toes  In the past Dr Paulla Dolly gave her a shot in her L great toe and removed part of her toenail   No redness or swelling  Did hurt when the sheet touches it in bed  She does not take her allopurinol every day     Her toes are "always cold" Prickly sensation under her toes - ? Wondered about DM neuropathy  Sensitive if she bumps into something also   Had A1C 7.9 with Dr Helane Rima  That was Jan   In the past - metformin gave her diarrhea - but she has that all the time anyway so ? If drug effects  She has not been ready to change her diet for diabetes  Most of the time she is just not motivated   Also suffering from nasal allergies and really needs flonase px  Clear nasal d/c and sniffling and congestion   Patient Active Problem List   Diagnosis Date Noted  . Great toe pain 05/12/2013  . Medication management 11/20/2012  . Abdominal pain, unspecified site 04/23/2012  . Uric acid kidney stone 11/20/2011  . HYPERHIDROSIS 11/07/2009  . HOT FLASHES 06/17/2009  . Type 2 diabetes, controlled, with neuropathy 08/23/2008  . VITAMIN D DEFICIENCY 05/07/2008  . VITAMIN B12 DEFICIENCY 06/04/2007  . CHRONIC FATIGUE SYNDROME 11/15/2006  . DIVERTICULITIS, HX OF 11/15/2006  . POLYCYSTIC OVARIAN DISEASE 10/16/2006  . HYPERLIPIDEMIA 10/16/2006  . OBESITY, MORBID 10/16/2006  . ANXIETY 10/16/2006  . PANIC DISORDER 10/16/2006  . BULIMIA 10/16/2006  . DEPRESSION 10/16/2006  . CARPAL TUNNEL SYNDROME 10/16/2006  . LYMPHEDEMA 10/16/2006  . ALLERGIC RHINITIS 10/16/2006  . ASTHMA, MILD, INTERMITTENT 10/16/2006  . TMJ SYNDROME 10/16/2006  . GERD 10/16/2006  . IRRITABLE BOWEL SYNDROME 10/16/2006  . OSTEOARTHRITIS 10/16/2006  . FIBROMYALGIA 10/16/2006  . COUGH, CHRONIC 10/16/2006   Past Medical History   Diagnosis Date  . Allergy     allergic rhinitis  . Anxiety   . Depression   . Hyperlipidemia   . Arthritis     osteoarthritis  . Obesity   . Lymphedema   . Tachycardia   . Neuromuscular disorder     fibromyalgia  . Hyperhidrosis   . GERD (gastroesophageal reflux disease)     EGD negative 06/2001// EGD erythematous mucosa, polyp 08/2008  . Fatty liver 07/2008    abd. ultrasound - fatty liver ; slt dilated cbd (no stones ) 06/10// abd.  ultrasound normal on 04/2006  . Diabetes mellitus without complication    Past Surgical History  Procedure Laterality Date  . Tonsillectomy    . Septoplasty    . Foot surgery    . Uterine tumor  09/2001  . Vagus nerve stimulator insertion    . Endometrial biopsy  01/2004  . Uterine fibroid surgery  06/2005    ablation   History  Substance Use Topics  . Smoking status: Never Smoker   . Smokeless tobacco: Not on file  . Alcohol Use: No   Family History  Problem Relation Age of Onset  . Hypertension Father   . Cancer Father     pancreatic cancer  . Hypertension Sister   . Heart disease Maternal Grandmother 60    MI  . Heart disease Paternal Grandmother 73  MI   Allergies  Allergen Reactions  . Cephalexin   . Codeine     REACTION: ? reaction  . Duloxetine   . Hydrocodone     REACTION: ? reaction  . Levaquin [Levofloxacin] Nausea Only  . Norelgestromin-Eth Estradiol   . Prednisone     REACTION: ? reaction  . Quetiapine   . Sertraline Hcl     REACTION: vivid dreams  . Triazolam     REACTION: ? reaction  . Zaleplon   . Zocor [Simvastatin - High Dose]     achey  . Zolpidem Tartrate     REACTION: ? reaction   Current Outpatient Prescriptions on File Prior to Visit  Medication Sig Dispense Refill  . acetaminophen (TYLENOL) 500 MG tablet Take 500 mg by mouth every 6 (six) hours as needed. OTC as directed       . albuterol (PROVENTIL HFA;VENTOLIN HFA) 108 (90 BASE) MCG/ACT inhaler Inhale 2 puffs into the lungs every 4  (four) hours as needed for wheezing.      Marland Kitchen allopurinol (ZYLOPRIM) 100 MG tablet Take 1 tablet (100 mg total) by mouth daily.  30 tablet  11  . ALPRAZolam (XANAX) 1 MG tablet Take 2 mg by mouth 3 (three) times daily as needed.       Marland Kitchen aluminum chloride (DRYSOL) 20 % external solution Apply topically at bedtime. Apply to affected areas at bedtime - then wash off in the AM once sweating improves - can use twice weekly       . benzonatate (TESSALON) 200 MG capsule TAKE ONE CAPSULE THREE TIMES A DAY AS NEEDED FOR COUGH--SWALLOW WHOLE--DO NOT CHEW  30 capsule  2  . buPROPion (WELLBUTRIN XL) 150 MG 24 hr tablet 450 mg daily.       . calcium carbonate (TUMS - DOSED IN MG ELEMENTAL CALCIUM) 500 MG chewable tablet Chew 1 tablet by mouth as needed.      . Cholecalciferol (VITAMIN D) 1000 UNITS capsule Take 1,000 Units by mouth daily.        . CloNIDine HCl 0.1 MG TB12 Take by mouth 2 (two) times daily. Take 1 tablet by mouth twice a day       . cyanocobalamin (,VITAMIN B-12,) 1000 MCG/ML injection Inject 1,000 mcg into the muscle once. 1 ml (1000 mcg) IM as directed every month       . cyclobenzaprine (FLEXERIL) 5 MG tablet Take 1 tablet (5 mg total) by mouth 2 (two) times daily as needed for muscle spasms.  60 tablet  3  . desoximetasone (TOPICORT) 0.25 % cream Apply topically as needed. Apply as directed      . dimenhyDRINATE (DRAMAMINE) 50 MG tablet Take 50 mg by mouth every 8 (eight) hours as needed.      Marland Kitchen FLUoxetine (PROZAC) 40 MG capsule Take 80 mg by mouth daily.       Marland Kitchen glycopyrrolate (ROBINUL) 1 MG tablet Take 2 tablets by mouth twice daily as needed for abdominal cramping  120 tablet  5  . guaifenesin (REFENESEN 400) 400 MG TABS Take 400 mg by mouth every 4 (four) hours as needed. OTC as directed      . ketoconazole (NIZORAL) 2 % cream Apply topically daily. Apply to affected area once daily for 2 weeks       . naphazoline-glycerin (CLEAR EYES) 0.012-0.2 % SOLN Place 1-2 drops into both eyes every  4 (four) hours as needed. OTC as directed       .  norethindrone-ethinyl estradiol (GILDESS 1/20) 1-20 MG-MCG tablet Take 1 tablet by mouth daily.      Marland Kitchen nystatin (NYSTOP) 100000 UNIT/GM POWD Apply topically 2 (two) times daily as needed. Apply to affected area two times a day until clear asneeded       . omeprazole (PRILOSEC) 40 MG capsule Take 1 capsule (40 mg total) by mouth 2 (two) times daily.  60 capsule  5  . oxybutynin (DITROPAN XL) 15 MG 24 hr tablet Take 1 tablet (15 mg total) by mouth daily.  30 tablet  11  . talc (ZEASORB) powder Apply topically as needed.      . traMADol (ULTRAM) 50 MG tablet Take 1 tablet (50 mg total) by mouth every 8 (eight) hours as needed.  60 tablet  3  . traZODone (DESYREL) 50 MG tablet Take 6 tablets  By mouth at bedtime. But can take up to 9 tablets daily       . [DISCONTINUED] oxybutynin (DITROPAN XL) 15 MG 24 hr tablet daily.       No current facility-administered medications on file prior to visit.     Review of Systems    Review of Systems  Constitutional: Negative for fever, appetite change, and unexpected weight change.  Eyes: Negative for pain and visual disturbance.  Respiratory: Negative for cough and shortness of breath.   Cardiovascular: Negative for cp or palpitations    Gastrointestinal: Negative for nausea,  and constipation. pos for chronic loose stools from IBS that have not changed  Genitourinary: Negative for urgency and frequency.  Skin: Negative for pallor or rash   Neurological: Negative for weakness, light-headedness, numbness and headaches.  Hematological: Negative for adenopathy. Does not bruise/bleed easily.  Psychiatric/Behavioral: pos for depression and anxiety that is not opt controlled (baseline)      Objective:   Physical Exam  Constitutional: She appears well-developed and well-nourished. No distress.  Morbidly obese and well app female  HENT:  Head: Normocephalic and atraumatic.  Right Ear: External ear normal.    Left Ear: External ear normal.  Mouth/Throat: Oropharynx is clear and moist.  Nares are boggy with clear rhinorrhea  No sinus tenderness  Eyes: Conjunctivae and EOM are normal. Pupils are equal, round, and reactive to light. Right eye exhibits no discharge. Left eye exhibits no discharge.  Neck: Normal range of motion. Neck supple.  Cardiovascular: Regular rhythm.   Pulmonary/Chest: Effort normal and breath sounds normal.  Musculoskeletal: She exhibits tenderness. She exhibits no edema.  bilat great toes - tenderness to fully flex  No redness or swelling  Nl sensation  Toenails nl appearing     Lymphadenopathy:    She has no cervical adenopathy.  Neurological: She is alert. She has normal reflexes. No cranial nerve deficit. She exhibits normal muscle tone. Coordination normal.  Skin: Skin is warm and dry. No rash noted. No erythema. No pallor.  Dry skin on extremeties  Psychiatric: Her speech is normal and behavior is normal. Thought content normal. Her mood appears anxious. Her affect is not blunt and not labile. She exhibits a depressed mood.  Tearful at times This is pretty much baseline for her           Assessment & Plan:

## 2013-05-12 NOTE — Assessment & Plan Note (Signed)
Discussed how this problem influences overall health and the risks it imposes  Reviewed plan for weight loss with lower calorie diet (via better food choices and also portion control or program like weight watchers) and exercise building up to or more than 30 minutes 5 days per week including some aerobic activity   Pt is not motivated - likely due to emotional issues

## 2013-05-12 NOTE — Assessment & Plan Note (Signed)
Will check this at 3 mo f/u Stressed imp of low sat fat diet

## 2013-05-12 NOTE — Progress Notes (Signed)
Pre visit review using our clinic review tool, if applicable. No additional management support is needed unless otherwise documented below in the visit note. 

## 2013-05-12 NOTE — Patient Instructions (Signed)
Uric acid level today-for possible gout and also kidney stones Make sure you are taking allopurinol  Start back on metformin 500 mg twice daily Follow up in 3 months with labs prior for diabetes   Get started back on flonase for allergies

## 2013-05-12 NOTE — Assessment & Plan Note (Signed)
Last A1C at gyn office 7.9 in mid jan  Pt wants to try metformin again  Not motivated for diet/exercise or wt loss unfortunately (her emotional issues are too profound) She has lost wt -? Due to high sugar Rev low glycemic diet

## 2013-05-12 NOTE — Assessment & Plan Note (Signed)
flonase px today This has helped seasonally in the past

## 2013-05-12 NOTE — Assessment & Plan Note (Signed)
Differential includes gout (if so improved now), wear and tear due to wt, oa , or poss DM neuropathy Uric acid level today Start metformin and then lab and f/u 3 mo

## 2013-05-14 ENCOUNTER — Telehealth: Payer: Self-pay

## 2013-05-14 NOTE — Telephone Encounter (Signed)
Her uric acid level is ok - I inst her to continue her allopurinol-please mail her a copy  Under references with epic you can find a low uric acid or low purine diet (? May be listed under gout diet)-please print one out and mail as well  She needs to be walked though the process of "un enlisting" from [employer] please

## 2013-05-14 NOTE — Telephone Encounter (Signed)
Pt request recent lab results; pt does not want to use mychart; pt notified as instructed from mychart note. Pt also request a diet to reduce uric acid; pt is aware to avoid meats. Pt said no rush next couple of weeks OK.

## 2013-05-18 NOTE — Telephone Encounter (Signed)
Unenrolled pt in [employer], mailed letter with Dr. Marliss Coots comments/labs and info abut low uric acid diet to pt

## 2013-05-22 ENCOUNTER — Ambulatory Visit: Payer: Medicare PPO | Admitting: Podiatry

## 2013-05-26 ENCOUNTER — Telehealth: Payer: Self-pay

## 2013-05-26 NOTE — Telephone Encounter (Signed)
Relevant patient education assigned to patient using Emmi. ° °

## 2013-06-03 ENCOUNTER — Telehealth: Payer: Self-pay | Admitting: Family Medicine

## 2013-06-03 NOTE — Telephone Encounter (Signed)
Patient Information:  Caller Name: Sheriann  Phone: [phone]  Patient: Rossi, Silvestro  Gender: Female  DOB: 04/28/1965  Age: 48 Years  PCP: Loura Pardon [facility])  Pregnant: No  Office Follow Up:  Does the office need to follow up with this patient?: No  Instructions For The Office: N/A   Symptoms  Reason For Call & Symptoms: Pt noticed a red dime sized area on her calf. Onset 05/18/13. Pt thinks she got bit by a spider/tic or ant - she didn't see anything at the time. It has not gotten any better . Pt has been applying triple abx ung TID.  Reviewed Health History In EMR: Yes  Reviewed Medications In EMR: Yes  Reviewed Allergies In EMR: Yes  Reviewed Surgeries / Procedures: Yes  Date of Onset of Symptoms: 05/18/2013 OB / GYN:  LMP: Unknown  Guideline(s) Used:  Spider Bite  Disposition Per Guideline:   See Within 3 Days in Office  Reason For Disposition Reached:   Bite starts to look bad (e.g., blister, purplish skin, ulcer)  Advice Given:  N/A  Patient Will Follow Care Advice:  YES  Appointment Scheduled:  06/05/2013 10:15:00 Appointment Scheduled Provider:  Loura Pardon [facility])

## 2013-06-03 NOTE — Telephone Encounter (Signed)
Will see her then 

## 2013-06-05 ENCOUNTER — Ambulatory Visit (INDEPENDENT_AMBULATORY_CARE_PROVIDER_SITE_OTHER): Payer: Medicare PPO | Admitting: Family Medicine

## 2013-06-05 ENCOUNTER — Encounter: Payer: Self-pay | Admitting: Family Medicine

## 2013-06-05 VITALS — BP 128/82 | HR 86 | Temp 98.3°F | Ht 64.0 in | Wt 355.5 lb

## 2013-06-05 DIAGNOSIS — E1149 Type 2 diabetes mellitus with other diabetic neurological complication: Secondary | ICD-10-CM

## 2013-06-05 DIAGNOSIS — E1142 Type 2 diabetes mellitus with diabetic polyneuropathy: Secondary | ICD-10-CM

## 2013-06-05 DIAGNOSIS — R059 Cough, unspecified: Secondary | ICD-10-CM

## 2013-06-05 DIAGNOSIS — T148 Other injury of unspecified body region: Secondary | ICD-10-CM

## 2013-06-05 DIAGNOSIS — E1140 Type 2 diabetes mellitus with diabetic neuropathy, unspecified: Secondary | ICD-10-CM

## 2013-06-05 DIAGNOSIS — R05 Cough: Secondary | ICD-10-CM

## 2013-06-05 DIAGNOSIS — W57XXXA Bitten or stung by nonvenomous insect and other nonvenomous arthropods, initial encounter: Secondary | ICD-10-CM | POA: Insufficient documentation

## 2013-06-05 MED ORDER — MOMETASONE FUROATE 0.1 % EX CREA: 1.0000 "application " | TOPICAL_CREAM | Freq: Every day | CUTANEOUS | Status: DC

## 2013-06-05 MED ORDER — BENZONATATE 200 MG PO CAPS: ORAL_CAPSULE | ORAL | Status: DC

## 2013-06-05 NOTE — Progress Notes (Signed)
Subjective:    Patient ID: Mackenzie Bonilla, female    DOB: Dec 20, 1965, 48 y.o.   MRN: [id number]  HPI Here for a suspected spider bite  Thinks it happened the Sat before Easter- a while ago- [place]  Does not remember being bitten (was just sitting outside) She never picked a tick off of it  No rings or bullseye pattern to it that she can tell  Monday she noticed a spot on her leg - did not itch or hurt but very deep red in color  There was a blister and dhe did not realize until she inadvertently popped it -it bled Is using triple abx  ? If scabbing up  Some diarrhea from the metformin  ? If tolerable  Has had to stop it at times - and it is not as bad  She wants to try 1 pill at lunch  Lab Results  Component Value Date   HGBA1C 6.9* 04/22/2012      Cough -needs tessalon refill - still has a cough -needs a bigger amt   Cough is chronic and it helps a lot    Patient Active Problem List   Diagnosis Date Noted  . Insect bite 06/05/2013  . Great toe pain 05/12/2013  . Medication management 11/20/2012  . Abdominal pain, unspecified site 04/23/2012  . Uric acid kidney stone 11/20/2011  . HYPERHIDROSIS 11/07/2009  . HOT FLASHES 06/17/2009  . Type 2 diabetes, controlled, with neuropathy 08/23/2008  . VITAMIN D DEFICIENCY 05/07/2008  . VITAMIN B12 DEFICIENCY 06/04/2007  . CHRONIC FATIGUE SYNDROME 11/15/2006  . DIVERTICULITIS, HX OF 11/15/2006  . POLYCYSTIC OVARIAN DISEASE 10/16/2006  . HYPERLIPIDEMIA 10/16/2006  . OBESITY, MORBID 10/16/2006  . ANXIETY 10/16/2006  . PANIC DISORDER 10/16/2006  . BULIMIA 10/16/2006  . DEPRESSION 10/16/2006  . CARPAL TUNNEL SYNDROME 10/16/2006  . LYMPHEDEMA 10/16/2006  . ALLERGIC RHINITIS 10/16/2006  . ASTHMA, MILD, INTERMITTENT 10/16/2006  . TMJ SYNDROME 10/16/2006  . GERD 10/16/2006  . IRRITABLE BOWEL SYNDROME 10/16/2006  . OSTEOARTHRITIS 10/16/2006  . FIBROMYALGIA 10/16/2006  . COUGH, CHRONIC  10/16/2006   Past Medical History  Diagnosis Date  . Allergy     allergic rhinitis  . Anxiety   . Depression   . Hyperlipidemia   . Arthritis     osteoarthritis  . Obesity   . Lymphedema   . Tachycardia   . Neuromuscular disorder     fibromyalgia  . Hyperhidrosis   . GERD (gastroesophageal reflux disease)     EGD negative 06/2001// EGD erythematous mucosa, polyp 08/2008  . Fatty liver 07/2008    abd. ultrasound - fatty liver ; slt dilated cbd (no stones ) 06/10// abd.  ultrasound normal on 04/2006  . Diabetes mellitus without complication    Past Surgical History  Procedure Laterality Date  . Tonsillectomy    . Septoplasty    . Foot surgery    . Uterine tumor  09/2001  . Vagus nerve stimulator insertion    . Endometrial biopsy  01/2004  . Uterine fibroid surgery  06/2005    ablation   History  Substance Use Topics  . Smoking status: Never Smoker   . Smokeless tobacco: Not on file  . Alcohol Use: No   Family History  Problem Relation Age of Onset  . Hypertension Father   . Cancer Father     pancreatic cancer  . Hypertension Sister   . Heart disease Maternal Grandmother 68  MI  . Heart disease Paternal Grandmother 19    MI   Allergies  Allergen Reactions  . Cephalexin   . Codeine     REACTION: ? reaction  . Duloxetine   . Hydrocodone     REACTION: ? reaction  . Levaquin [Levofloxacin] Nausea Only  . Norelgestromin-Eth Estradiol   . Prednisone     REACTION: ? reaction  . Quetiapine   . Sertraline Hcl     REACTION: vivid dreams  . Triazolam     REACTION: ? reaction  . Zaleplon   . Zocor [Simvastatin - High Dose]     achey  . Zolpidem Tartrate     REACTION: ? reaction   Current Outpatient Prescriptions on File Prior to Visit  Medication Sig Dispense Refill  . acetaminophen (TYLENOL) 500 MG tablet Take 500 mg by mouth every 6 (six) hours as needed. OTC as directed       . albuterol (PROVENTIL HFA;VENTOLIN HFA) 108 (90 BASE) MCG/ACT inhaler  Inhale 2 puffs into the lungs every 4 (four) hours as needed for wheezing.      Marland Kitchen allopurinol (ZYLOPRIM) 100 MG tablet Take 1 tablet (100 mg total) by mouth daily.  30 tablet  11  . ALPRAZolam (XANAX) 1 MG tablet Take 2 mg by mouth 3 (three) times daily as needed.       Marland Kitchen aluminum chloride (DRYSOL) 20 % external solution Apply topically at bedtime. Apply to affected areas at bedtime - then wash off in the AM once sweating improves - can use twice weekly       . buPROPion (WELLBUTRIN XL) 150 MG 24 hr tablet 450 mg daily.       . calcium carbonate (TUMS - DOSED IN MG ELEMENTAL CALCIUM) 500 MG chewable tablet Chew 1 tablet by mouth as needed.      . Cholecalciferol (VITAMIN D) 1000 UNITS capsule Take 1,000 Units by mouth daily.        . CloNIDine HCl 0.1 MG TB12 Take by mouth 2 (two) times daily. Take 1 tablet by mouth twice a day       . cyanocobalamin (,VITAMIN B-12,) 1000 MCG/ML injection Inject 1,000 mcg into the muscle once. 1 ml (1000 mcg) IM as directed every month       . cyclobenzaprine (FLEXERIL) 5 MG tablet Take 1 tablet (5 mg total) by mouth 2 (two) times daily as needed for muscle spasms.  60 tablet  3  . desoximetasone (TOPICORT) 0.25 % cream Apply topically as needed. Apply as directed      . dimenhyDRINATE (DRAMAMINE) 50 MG tablet Take 50 mg by mouth every 8 (eight) hours as needed.      Marland Kitchen FLUoxetine (PROZAC) 40 MG capsule Take 80 mg by mouth daily.       . fluticasone (FLONASE) 50 MCG/ACT nasal spray Place 2 sprays into both nostrils daily.  16 g  6  . glycopyrrolate (ROBINUL) 1 MG tablet Take 2 tablets by mouth twice daily as needed for abdominal cramping  120 tablet  5  . guaifenesin (REFENESEN 400) 400 MG TABS Take 400 mg by mouth every 4 (four) hours as needed. OTC as directed      . ketoconazole (NIZORAL) 2 % cream Apply topically daily. Apply to affected area once daily for 2 weeks       . metFORMIN (GLUCOPHAGE) 500 MG tablet Take 1 tablet (500 mg total) by mouth 2 (two) times  daily with a meal.  60 tablet  5  . naphazoline-glycerin (CLEAR EYES) 0.012-0.2 % SOLN Place 1-2 drops into both eyes every 4 (four) hours as needed. OTC as directed       . norethindrone-ethinyl estradiol (GILDESS 1/20) 1-20 MG-MCG tablet Take 1 tablet by mouth daily.      Marland Kitchen nystatin (NYSTOP) 100000 UNIT/GM POWD Apply topically 2 (two) times daily as needed. Apply to affected area two times a day until clear asneeded       . omeprazole (PRILOSEC) 40 MG capsule Take 1 capsule (40 mg total) by mouth 2 (two) times daily.  60 capsule  5  . oxybutynin (DITROPAN XL) 15 MG 24 hr tablet Take 1 tablet (15 mg total) by mouth daily.  30 tablet  11  . PARoxetine Mesylate (BRISDELLE) 7.5 MG CAPS Take 7.5 mg by mouth daily.      Marland Kitchen talc (ZEASORB) powder Apply topically as needed.      . traMADol (ULTRAM) 50 MG tablet Take 1 tablet (50 mg total) by mouth every 8 (eight) hours as needed.  60 tablet  3  . traZODone (DESYREL) 50 MG tablet Take 6 tablets  By mouth at bedtime. But can take up to 9 tablets daily       . [DISCONTINUED] oxybutynin (DITROPAN XL) 15 MG 24 hr tablet daily.       No current facility-administered medications on file prior to visit.    Review of Systems Review of Systems  Constitutional: Negative for fever, appetite change, and unexpected weight change.  Eyes: Negative for pain and visual disturbance.  Respiratory: Negative for cough and shortness of breath.   Cardiovascular: Negative for cp or palpitations    Gastrointestinal: Negative for nausea,  and constipation. pos for diarrhea intermittent and IBS Genitourinary: Negative for urgency and frequency.  Skin: Negative for pallor or rash pos for scab/insect bite   MSK pos for chronic pain  Neurological: Negative for weakness, light-headedness, numbness and headaches.  Hematological: Negative for adenopathy. Does not bruise/bleed easily.  Psychiatric/Behavioral: pos for dep and anx          Objective:   Physical Exam    Constitutional: She appears well-developed and well-nourished. No distress.  HENT:  Head: Normocephalic and atraumatic.  Eyes: Conjunctivae and EOM are normal. Pupils are equal, round, and reactive to light.  Cardiovascular: Normal rate and regular rhythm.   Pulmonary/Chest: Effort normal and breath sounds normal.  Musculoskeletal: She exhibits tenderness. She exhibits no edema.  Neurological: She is alert. She has normal reflexes.  Skin: Skin is warm and dry.  Nickel size erythematous scab on lower L leg No tick present and no central clearing  No swelling or drainage   No other lesions   Psychiatric: She has a normal mood and affect.          Assessment & Plan:

## 2013-06-05 NOTE — Progress Notes (Signed)
Pre visit review using our clinic review tool, if applicable. No additional management support is needed unless otherwise documented below in the visit note. 

## 2013-06-05 NOTE — Patient Instructions (Signed)
Play with your metfomin- try one dose in the middle of the day  Get sugar free metamucil or citrucel and try one dose per day  This may help diarrhea  If no improvement -stop the metformin Use elocon cream on the spot on your leg once daily in the evening  Then use triple antibiotic topcial otc once daily in the am  If no improvement-call and let me know

## 2013-06-07 NOTE — Assessment & Plan Note (Signed)
Pt will try metformin dose in middle of day with meal and also fiber supplement daily If diarrhea continues (more than her regular IBS) and is intolerable-inst to stop med and update [place]

## 2013-06-07 NOTE — Assessment & Plan Note (Signed)
Scab/healing and does not app infected inst to keep clean /cover if necessary abx oint otc bid  Update if not starting to improve in a week or if worsening

## 2013-06-07 NOTE — Assessment & Plan Note (Signed)
Refill tessalon- 90 pills with refills

## 2013-06-12 ENCOUNTER — Ambulatory Visit (INDEPENDENT_AMBULATORY_CARE_PROVIDER_SITE_OTHER): Payer: Medicare PPO | Admitting: Podiatry

## 2013-06-12 VITALS — Resp 16 | Ht 64.0 in | Wt 350.0 lb

## 2013-06-12 DIAGNOSIS — B351 Tinea unguium: Secondary | ICD-10-CM

## 2013-06-12 DIAGNOSIS — M79609 Pain in unspecified limb: Secondary | ICD-10-CM

## 2013-06-12 DIAGNOSIS — L600 Ingrowing nail: Secondary | ICD-10-CM

## 2013-06-12 NOTE — Patient Instructions (Signed)

## 2013-06-14 NOTE — Progress Notes (Signed)
Subjective:     Patient ID: Mackenzie Bonilla, female   DOB: [birth year], 48 y.o.   MRN: [id number]  HPI patient presents with nail disease 1-5 both feet that are thick incurvated and possible for her to cut do to extreme obesity and also an incurvated left hallux medial order nail that the patient cannot take care of   Review of Systems     Objective:   Physical Exam Neurovascular status is unchanged with patient's digits are well perfused and well oriented x3. Left hallux medial border is incurvated and sore and nailbeds of both feet are brittle yellow and painful in the corners    Assessment:     Ingrown toenail deformity left hallux medial border and mycotic nail infection to 1-5 both feet with pain    Plan:     Explained removal of the corner which she wants done understanding risk. Infiltrated 60 mg Xylocaine Marcaine mixture and remove the corner exposing matrix and applied phenol 3 applications 30 seconds followed by alcohol lavaged and sterile dressing. Debrided remaining nails with no iatrogenic bleeding noted

## 2013-07-23 ENCOUNTER — Encounter (INDEPENDENT_AMBULATORY_CARE_PROVIDER_SITE_OTHER): Payer: Self-pay

## 2013-07-23 ENCOUNTER — Ambulatory Visit (INDEPENDENT_AMBULATORY_CARE_PROVIDER_SITE_OTHER): Payer: Medicare PPO | Admitting: Family Medicine

## 2013-07-23 ENCOUNTER — Encounter: Payer: Self-pay | Admitting: Family Medicine

## 2013-07-23 ENCOUNTER — Ambulatory Visit (INDEPENDENT_AMBULATORY_CARE_PROVIDER_SITE_OTHER)
Admission: RE | Admit: 2013-07-23 | Discharge: 2013-07-23 | Disposition: A | Discharge status: Home / Self Care | Payer: Medicare PPO | Source: Ambulatory Visit | Attending: Family Medicine | Admitting: Family Medicine

## 2013-07-23 VITALS — BP 118/76 | HR 76 | Temp 98.2°F | Wt 344.2 lb

## 2013-07-23 DIAGNOSIS — W19XXXA Unspecified fall, initial encounter: Secondary | ICD-10-CM | POA: Insufficient documentation

## 2013-07-23 DIAGNOSIS — M25561 Pain in right knee: Secondary | ICD-10-CM

## 2013-07-23 DIAGNOSIS — M25569 Pain in unspecified knee: Secondary | ICD-10-CM

## 2013-07-23 MED ORDER — NAPROXEN 500 MG PO TABS
ORAL_TABLET | ORAL | Status: DC
Start: 2013-07-23 — End: 2013-08-11

## 2013-07-23 NOTE — Assessment & Plan Note (Addendum)
Difficult exam but I'm not appreciating obvious ligament injury. However there is significant swelling/bruising at R knee concerning for intrinsic injury. Check knee xray today - overall stable on my read, will await radiology read. Discussed ice, elevation, NSAID, flexeril and tramadol for breakthrough pain. I did suggest referral to SM/ortho for further eval if not improving over time. Pt did not fit into knee brace offered today given morbid obesity. Discussed importance of weight loss.

## 2013-07-23 NOTE — Progress Notes (Signed)
Pre visit review using our clinic review tool, if applicable. No additional management support is needed unless otherwise documented below in the visit note. 

## 2013-07-23 NOTE — Assessment & Plan Note (Signed)
Fall suffered 4 d ago, with residual diffuse body aches after fall but no localizing sxs other than R knee. rec NSAID, flexeril and tramadol for breakthrough pain. Update if not improving over time.

## 2013-07-23 NOTE — Progress Notes (Signed)
BP 118/76  Pulse 76  Temp(Src) 98.2 F (36.8 C) (Oral)  Wt 344 lb 4 oz (156.151 kg)   CC: fall Saturday  Subjective:    Patient ID: Mackenzie Bonilla, female    DOB: Dec 26, 1965, 48 y.o.   MRN: [id number]  HPI: Mackenzie Bonilla is a 48 y.o. female presenting on 07/23/2013 for Pain   DOI: Fall Saturday evening 07/18/2013 [place] outside tripped into unseen hole. Fell on knees and scraped face, hands and wrist. Since then having neck, arm pain.  Also having R knee pain along with lateral R leg numbness. Knee tightness but no swelling. Anterior knee sensitive as well. R foot numbness present.  H/o R leg lymphedema. H/o FM. Known h/o osteoarthritis - wrists and knees  Taking 600mg  ibuprofen tid per pharmacist's recs which has helped. Started flexeril as well but has not been taking this since ibuprofen was started. Only taken tramadol x1.  Past Medical History  Diagnosis Date  . Allergy     allergic rhinitis  . Anxiety   . Depression   . Hyperlipidemia   . Arthritis     osteoarthritis  . Obesity   . Lymphedema   . Tachycardia   . Neuromuscular disorder     fibromyalgia  . Hyperhidrosis   . GERD (gastroesophageal reflux disease)     EGD negative 06/2001// EGD erythematous mucosa, polyp 08/2008  . Fatty liver 07/2008    abd. ultrasound - fatty liver ; slt dilated cbd (no stones ) 06/10// abd.  ultrasound normal on 04/2006  . Diabetes mellitus without complication      Relevant past medical, surgical, family and social history reviewed and updated as indicated.  Allergies and medications reviewed and updated. Current Outpatient Prescriptions on File Prior to Visit  Medication Sig  . acetaminophen (TYLENOL) 500 MG tablet Take 500 mg by mouth every 6 (six) hours as needed. OTC as directed   . albuterol (PROVENTIL HFA;VENTOLIN HFA) 108 (90 BASE) MCG/ACT inhaler Inhale 2 puffs into the lungs every 4 (four) hours as needed for wheezing.  Marland Kitchen allopurinol (ZYLOPRIM) 100 MG tablet  Take 1 tablet (100 mg total) by mouth daily.  Marland Kitchen ALPRAZolam (XANAX) 1 MG tablet Take 2 mg by mouth 3 (three) times daily as needed.   Marland Kitchen aluminum chloride (DRYSOL) 20 % external solution Apply topically at bedtime. Apply to affected areas at bedtime - then wash off in the AM once sweating improves - can use twice weekly   . benzonatate (TESSALON) 200 MG capsule TAKE ONE CAPSULE THREE TIMES A DAY AS NEEDED FOR COUGH--SWALLOW WHOLE--DO NOT CHEW  . buPROPion (WELLBUTRIN XL) 150 MG 24 hr tablet 450 mg daily.   . calcium carbonate (TUMS - DOSED IN MG ELEMENTAL CALCIUM) 500 MG chewable tablet Chew 1 tablet by mouth as needed.  . Cholecalciferol (VITAMIN D) 1000 UNITS capsule Take 1,000 Units by mouth daily.    . CloNIDine HCl 0.1 MG TB12 Take by mouth 2 (two) times daily. Take 1 tablet by mouth twice a day   . cyanocobalamin (,VITAMIN B-12,) 1000 MCG/ML injection Inject 1,000 mcg into the muscle once. 1 ml (1000 mcg) IM as directed every month   . cyclobenzaprine (FLEXERIL) 5 MG tablet Take 1 tablet (5 mg total) by mouth 2 (two) times daily as needed for muscle spasms.  Marland Kitchen desoximetasone (TOPICORT) 0.25 % cream Apply topically as needed. Apply as directed  . dimenhyDRINATE (DRAMAMINE) 50 MG tablet Take 50 mg by mouth every 8 (eight)  hours as needed.  Marland Kitchen FLUoxetine (PROZAC) 40 MG capsule Take 80 mg by mouth daily.   . fluticasone (FLONASE) 50 MCG/ACT nasal spray Place 2 sprays into both nostrils daily.  Marland Kitchen glycopyrrolate (ROBINUL) 1 MG tablet Take 2 tablets by mouth twice daily as needed for abdominal cramping  . guaifenesin (REFENESEN 400) 400 MG TABS Take 400 mg by mouth every 4 (four) hours as needed. OTC as directed  . ketoconazole (NIZORAL) 2 % cream Apply topically daily. Apply to affected area once daily for 2 weeks   . metFORMIN (GLUCOPHAGE) 500 MG tablet Take 1 tablet (500 mg total) by mouth 2 (two) times daily with a meal.  . mometasone (ELOCON) 0.1 % cream Apply 1 application topically daily. Apply  to affected area once daily  . naphazoline-glycerin (CLEAR EYES) 0.012-0.2 % SOLN Place 1-2 drops into both eyes every 4 (four) hours as needed. OTC as directed   . norethindrone-ethinyl estradiol (GILDESS 1/20) 1-20 MG-MCG tablet Take 1 tablet by mouth daily.  Marland Kitchen nystatin (NYSTOP) 100000 UNIT/GM POWD Apply topically 2 (two) times daily as needed. Apply to affected area two times a day until clear asneeded   . omeprazole (PRILOSEC) 40 MG capsule Take 1 capsule (40 mg total) by mouth 2 (two) times daily.  Marland Kitchen oxybutynin (DITROPAN XL) 15 MG 24 hr tablet Take 1 tablet (15 mg total) by mouth daily.  Marland Kitchen PARoxetine Mesylate (BRISDELLE) 7.5 MG CAPS Take 7.5 mg by mouth daily.  Marland Kitchen talc (ZEASORB) powder Apply topically as needed.  . traMADol (ULTRAM) 50 MG tablet Take 1 tablet (50 mg total) by mouth every 8 (eight) hours as needed.  . traZODone (DESYREL) 50 MG tablet Take 6 tablets  By mouth at bedtime. But can take up to 9 tablets daily   . [DISCONTINUED] oxybutynin (DITROPAN XL) 15 MG 24 hr tablet daily.   No current facility-administered medications on file prior to visit.    Review of Systems Per HPI unless specifically indicated above    Objective:    BP 118/76  Pulse 76  Temp(Src) 98.2 F (36.8 C) (Oral)  Wt 344 lb 4 oz (156.151 kg)  Physical Exam  Nursing note and vitals reviewed. Constitutional: She appears well-developed and well-nourished. No distress.  Morbidly obese - difficult MSK exam  Musculoskeletal: She exhibits edema (chronic nonpitting (lymph)).  Mild discomfort lumbar spine  L knee - mild swelling, ecchymosis anterior leg inferior to patella, tender at medial joint line, neg drawer, neg mcmurray's, no ligament laxity. R knee - moderate swelling with anterior knee abrasion and ecchymosis noted, tender to palpation bilateral joint lines and anterior knee. Neg drawer, neg mcmurray, no evident ligament laxity. Able to ambulate without significant antalgic gait. 2+ DP, good cap  refill.  Neurological:  nonantalgic gait Mild numbness lateral lower leg.       Assessment & Plan:   Problem List Items Addressed This Visit   Right knee pain - Primary     Difficult exam but I'm not appreciating obvious ligament injury. However there is significant swelling/bruising at R knee concerning for intrinsic injury. Check knee xray today - overall stable on my read, will await radiology read. Discussed ice, elevation, NSAID, flexeril and tramadol for breakthrough pain. I did suggest referral to SM/ortho for further eval if not improving over time. Pt did not fit into knee brace offered today given morbid obesity. Discussed importance of weight loss.    Relevant Orders      DG Knee Complete 4 Views  Right   Fall     Fall suffered 4 d ago, with residual diffuse body aches after fall but no localizing sxs other than R knee. rec NSAID, flexeril and tramadol for breakthrough pain. Update if not improving over time.        Follow up plan: Return if symptoms worsen or fail to improve.

## 2013-07-23 NOTE — Patient Instructions (Addendum)
Knee xrays are looking ok - this may be bad knee strain, but I'd still like to send you to orthopedist for further evaluation and help rule out knee ligament injury. In the interim I want you to take flexeril together with naprosyn (sent to pharmacy) and may use tramadol for breakthrough pain. Take naprosyn twice daily with food for 5 days then as needed. We will call you tomorrow to schedule appointment

## 2013-08-11 ENCOUNTER — Ambulatory Visit (INDEPENDENT_AMBULATORY_CARE_PROVIDER_SITE_OTHER): Payer: Medicare PPO | Admitting: Family Medicine

## 2013-08-11 ENCOUNTER — Encounter: Payer: Self-pay | Admitting: Family Medicine

## 2013-08-11 ENCOUNTER — Ambulatory Visit (INDEPENDENT_AMBULATORY_CARE_PROVIDER_SITE_OTHER): Payer: Medicare PPO | Admitting: Podiatry

## 2013-08-11 VITALS — BP 132/86 | HR 117 | Resp 16

## 2013-08-11 VITALS — BP 110/72 | HR 107 | Temp 98.8°F | Ht 64.0 in | Wt 354.0 lb

## 2013-08-11 DIAGNOSIS — J019 Acute sinusitis, unspecified: Secondary | ICD-10-CM | POA: Insufficient documentation

## 2013-08-11 DIAGNOSIS — L600 Ingrowing nail: Secondary | ICD-10-CM | POA: Insufficient documentation

## 2013-08-11 DIAGNOSIS — E1149 Type 2 diabetes mellitus with other diabetic neurological complication: Secondary | ICD-10-CM

## 2013-08-11 DIAGNOSIS — E1140 Type 2 diabetes mellitus with diabetic neuropathy, unspecified: Secondary | ICD-10-CM

## 2013-08-11 DIAGNOSIS — E1142 Type 2 diabetes mellitus with diabetic polyneuropathy: Secondary | ICD-10-CM

## 2013-08-11 DIAGNOSIS — S93609A Unspecified sprain of unspecified foot, initial encounter: Secondary | ICD-10-CM

## 2013-08-11 DIAGNOSIS — J0101 Acute recurrent maxillary sinusitis: Secondary | ICD-10-CM

## 2013-08-11 DIAGNOSIS — M25561 Pain in right knee: Secondary | ICD-10-CM

## 2013-08-11 DIAGNOSIS — J0100 Acute maxillary sinusitis, unspecified: Secondary | ICD-10-CM

## 2013-08-11 DIAGNOSIS — E538 Deficiency of other specified B group vitamins: Secondary | ICD-10-CM

## 2013-08-11 DIAGNOSIS — M25569 Pain in unspecified knee: Secondary | ICD-10-CM

## 2013-08-11 LAB — COMPREHENSIVE METABOLIC PANEL
ALBUMIN: 3.4 g/dL — AB (ref 3.5–5.2)
ALT: 20 U/L (ref 0–35)
AST: 25 U/L (ref 0–37)
Alkaline Phosphatase: 48 U/L (ref 39–117)
BUN: 9 mg/dL (ref 6–23)
CALCIUM: 9.3 mg/dL (ref 8.4–10.5)
CHLORIDE: 101 meq/L (ref 96–112)
CO2: 27 mEq/L (ref 19–32)
CREATININE: 0.9 mg/dL (ref 0.4–1.2)
GFR: 74.83 mL/min (ref >60.00)
GLUCOSE: 282 mg/dL — AB (ref 70–99)
Potassium: 4.2 mEq/L (ref 3.5–5.1)
Sodium: 139 mEq/L (ref 135–145)
Total Bilirubin: 0.3 mg/dL (ref 0.2–1.2)
Total Protein: 6.8 g/dL (ref 6.0–8.3)

## 2013-08-11 LAB — HEMOGLOBIN A1C: HEMOGLOBIN A1C: 8 % — AB (ref 4.6–6.5)

## 2013-08-11 LAB — VITAMIN B12: Vitamin B-12: 247 pg/mL (ref 211–911)

## 2013-08-11 MED ORDER — AMOXICILLIN-POT CLAVULANATE 875-125 MG PO TABS: 1.0000 | ORAL_TABLET | Freq: Two times a day (BID) | ORAL | Status: DC

## 2013-08-11 NOTE — Progress Notes (Signed)
Subjective:     Patient ID: Mackenzie Bonilla, female   DOB: 07-29-1965, 48 y.o.   MRN: [id number]  HPI patient presents stating that she traumatized her right foot and it's been swelling and that also she is an ingrown toenail on the fourth nail right that's been sore. States her sugar has been okay  Review of Systems     Objective:   Physical Exam Neurovascular status unchanged with edema in the forefoot right of a nondescript nature and an incurvated fourth nail right that is painful and lateral corner    Assessment:     Trauma to the right foot with no apparent fracture and ingrown toenail right fourth toe    Plan:     Reviewed condition and discussed and at this time discussed correction of the ingrown toenail. Patient wants to have this done and I explained the procedure and risk and today I infiltrated 60 mg Xylocaine Marcaine mixture to the right fourth toe and after sterile prep remove the nail border lateral and exposed the matrix applying chemical phenol 3 applications followed by alcohol lavaged and sterile dressing. Reappoint to recheck and instructed on soaks

## 2013-08-11 NOTE — Progress Notes (Signed)
Subjective:    Patient ID: Mackenzie Bonilla, female    DOB: 1965-03-03, 48 y.o.   MRN: [id number]  HPI Here with several problems   R knee pain since fall on 6/6 - ? Twisted or fell on it  Got some better and then worse again  Saw Dr Darnell Level (see his note) rec elevation and nsaid and flexeril and tramadol  Knee is now numb in a different place  Knee hurts- over top of it - pins and needles and then sharp pains - on and off  Bruises are gone at this point  ? If swollen  Pops when she bends it  (this is also her lymphedema leg)   Also 1/2 of her toenail came off -and bled a little yesterday   She bought a knee brace - having trouble getting it to stay on - ? Too big and complicates lymphedema   Also nasal symptoms -  Has headaches and also upper teeth hurt  Pain is just under her eyes - with purulent nasal drainage  Temp is up a bit  No cough  Patient Active Problem List   Diagnosis Date Noted  . Acute sinusitis 08/11/2013  . Ingrown toenail 08/11/2013  . Right knee pain 07/23/2013  . Fall 07/23/2013  . Insect bite 06/05/2013  . Great toe pain 05/12/2013  . Medication management 11/20/2012  . Abdominal pain, unspecified site 04/23/2012  . Uric acid kidney stone 11/20/2011  . HYPERHIDROSIS 11/07/2009  . HOT FLASHES 06/17/2009  . Type 2 diabetes, controlled, with neuropathy 08/23/2008  . VITAMIN D DEFICIENCY 05/07/2008  . VITAMIN B12 DEFICIENCY 06/04/2007  . CHRONIC FATIGUE SYNDROME 11/15/2006  . DIVERTICULITIS, HX OF 11/15/2006  . POLYCYSTIC OVARIAN DISEASE 10/16/2006  . HYPERLIPIDEMIA 10/16/2006  . OBESITY, MORBID 10/16/2006  . ANXIETY 10/16/2006  . PANIC DISORDER 10/16/2006  . BULIMIA 10/16/2006  . DEPRESSION 10/16/2006  . CARPAL TUNNEL SYNDROME 10/16/2006  . LYMPHEDEMA 10/16/2006  . ALLERGIC RHINITIS 10/16/2006  . ASTHMA, MILD, INTERMITTENT 10/16/2006  . TMJ SYNDROME 10/16/2006  . GERD 10/16/2006  . IRRITABLE BOWEL SYNDROME 10/16/2006  . OSTEOARTHRITIS  10/16/2006  . FIBROMYALGIA 10/16/2006  . COUGH, CHRONIC 10/16/2006   Past Medical History  Diagnosis Date  . Allergy     allergic rhinitis  . Anxiety   . Depression   . Hyperlipidemia   . Arthritis     osteoarthritis  . Obesity   . Lymphedema   . Tachycardia   . Neuromuscular disorder     fibromyalgia  . Hyperhidrosis   . GERD (gastroesophageal reflux disease)     EGD negative 06/2001// EGD erythematous mucosa, polyp 08/2008  . Fatty liver 07/2008    abd. ultrasound - fatty liver ; slt dilated cbd (no stones ) 06/10// abd.  ultrasound normal on 04/2006  . Diabetes mellitus without complication    Past Surgical History  Procedure Laterality Date  . Tonsillectomy    . Septoplasty    . Foot surgery    . Uterine tumor  09/2001  . Vagus nerve stimulator insertion    . Endometrial biopsy  01/2004  . Uterine fibroid surgery  06/2005    ablation   History  Substance Use Topics  . Smoking status: Never Smoker   . Smokeless tobacco: Not on file  . Alcohol Use: No   Family History  Problem Relation Age of Onset  . Hypertension Father   . Cancer Father     pancreatic cancer  . Hypertension Sister   .  Heart disease Maternal Grandmother 60    MI  . Heart disease Paternal Grandmother 75    MI   Allergies  Allergen Reactions  . Cephalexin   . Codeine     REACTION: ? reaction  . Duloxetine   . Hydrocodone     REACTION: ? reaction  . Levaquin [Levofloxacin] Nausea Only  . Norelgestromin-Eth Estradiol   . Prednisone     REACTION: ? reaction  . Quetiapine   . Sertraline Hcl     REACTION: vivid dreams  . Triazolam     REACTION: ? reaction  . Zaleplon   . Zocor [Simvastatin - High Dose]     achey  . Zolpidem Tartrate     REACTION: ? reaction   Current Outpatient Prescriptions on File Prior to Visit  Medication Sig Dispense Refill  . acetaminophen (TYLENOL) 500 MG tablet Take 500 mg by mouth every 6 (six) hours as needed. OTC as directed       . albuterol  (PROVENTIL HFA;VENTOLIN HFA) 108 (90 BASE) MCG/ACT inhaler Inhale 2 puffs into the lungs every 4 (four) hours as needed for wheezing.      Marland Kitchen allopurinol (ZYLOPRIM) 100 MG tablet Take 1 tablet (100 mg total) by mouth daily.  30 tablet  11  . ALPRAZolam (XANAX) 1 MG tablet Take 2 mg by mouth 3 (three) times daily as needed.       Marland Kitchen aluminum chloride (DRYSOL) 20 % external solution Apply topically at bedtime. Apply to affected areas at bedtime - then wash off in the AM once sweating improves - can use twice weekly       . benzonatate (TESSALON) 200 MG capsule TAKE ONE CAPSULE THREE TIMES A DAY AS NEEDED FOR COUGH--SWALLOW WHOLE--DO NOT CHEW  90 capsule  3  . buPROPion (WELLBUTRIN XL) 150 MG 24 hr tablet 450 mg daily.       . calcium carbonate (TUMS - DOSED IN MG ELEMENTAL CALCIUM) 500 MG chewable tablet Chew 1 tablet by mouth as needed.      . Cholecalciferol (VITAMIN D) 1000 UNITS capsule Take 1,000 Units by mouth daily.        . CloNIDine HCl 0.1 MG TB12 Take by mouth 2 (two) times daily. Take 1 tablet by mouth twice a day       . cyanocobalamin (,VITAMIN B-12,) 1000 MCG/ML injection Inject 1,000 mcg into the muscle once. 1 ml (1000 mcg) IM as directed every month       . cyclobenzaprine (FLEXERIL) 5 MG tablet Take 1 tablet (5 mg total) by mouth 2 (two) times daily as needed for muscle spasms.  60 tablet  3  . desoximetasone (TOPICORT) 0.25 % cream Apply topically as needed. Apply as directed      . dimenhyDRINATE (DRAMAMINE) 50 MG tablet Take 50 mg by mouth every 8 (eight) hours as needed.      Marland Kitchen FLUoxetine (PROZAC) 40 MG capsule Take 80 mg by mouth daily.       . fluticasone (FLONASE) 50 MCG/ACT nasal spray Place 2 sprays into both nostrils daily.  16 g  6  . glycopyrrolate (ROBINUL) 1 MG tablet Take 2 tablets by mouth twice daily as needed for abdominal cramping  120 tablet  5  . guaifenesin (REFENESEN 400) 400 MG TABS Take 400 mg by mouth every 4 (four) hours as needed. OTC as directed      .  ketoconazole (NIZORAL) 2 % cream Apply topically daily. Apply to affected area once  daily for 2 weeks       . metFORMIN (GLUCOPHAGE) 500 MG tablet Take 1 tablet (500 mg total) by mouth 2 (two) times daily with a meal.  60 tablet  5  . mometasone (ELOCON) 0.1 % cream Apply 1 application topically daily. Apply to affected area once daily  15 g  0  . naphazoline-glycerin (CLEAR EYES) 0.012-0.2 % SOLN Place 1-2 drops into both eyes every 4 (four) hours as needed. OTC as directed       . norethindrone-ethinyl estradiol (GILDESS 1/20) 1-20 MG-MCG tablet Take 1 tablet by mouth daily.      Marland Kitchen nystatin (NYSTOP) 100000 UNIT/GM POWD Apply topically 2 (two) times daily as needed. Apply to affected area two times a day until clear asneeded       . omeprazole (PRILOSEC) 40 MG capsule Take 1 capsule (40 mg total) by mouth 2 (two) times daily.  60 capsule  5  . oxybutynin (DITROPAN XL) 15 MG 24 hr tablet Take 1 tablet (15 mg total) by mouth daily.  30 tablet  11  . PARoxetine Mesylate (BRISDELLE) 7.5 MG CAPS Take 7.5 mg by mouth daily.      Marland Kitchen talc (ZEASORB) powder Apply topically as needed.      . traMADol (ULTRAM) 50 MG tablet Take 1 tablet (50 mg total) by mouth every 8 (eight) hours as needed.  60 tablet  3  . traZODone (DESYREL) 50 MG tablet Take 6 tablets  By mouth at bedtime. But can take up to 9 tablets daily        No current facility-administered medications on file prior to visit.    Review of Systems Review of Systems  Constitutional: Negative for fever, appetite change,  and unexpected weight change.  Eyes: Negative for pain and visual disturbance. ENT pos for congestion/ facial pain and purulent nasal drainage  Respiratory: Negative for cough and shortness of breath.   Cardiovascular: Negative for cp or palpitations    Gastrointestinal: Negative for nausea, diarrhea and constipation.  Genitourinary: Negative for urgency and frequency.  Skin: Negative for pallor or rash  pos for toenail  abnormality MSK pos for R knee pain/ neg for other joint c/o Neurological: Negative for weakness, light-headedness, numbness and pos for  headaches.  Hematological: Negative for adenopathy. Does not bruise/bleed easily.  Psychiatric/Behavioral: Negative for dysphoric mood. The patient is not nervous/anxious.         Objective:   Physical Exam  Constitutional: She appears well-developed and well-nourished. No distress.  Morbidly obese and well appearing   HENT:  Head: Normocephalic and atraumatic.  Right Ear: External ear normal.  Left Ear: External ear normal.  Nose: Nose normal.  Mouth/Throat: Oropharynx is clear and moist. No oropharyngeal exudate.  Nares are injected and congested   Bilateral maxillary sinus tenderness Throat clear   Eyes: Conjunctivae and EOM are normal. Pupils are equal, round, and reactive to light. No scleral icterus.  Neck: Normal range of motion. Neck supple. No JVD present. Carotid bruit is not present. No thyromegaly present.  Cardiovascular: Regular rhythm and intact distal pulses.   Pulmonary/Chest: Effort normal and breath sounds normal. No respiratory distress. She has no wheezes. She has no rales.  Musculoskeletal: She exhibits tenderness. She exhibits no edema.  L knee- slt soft tissue swelling (difficult to examine in light of obesity) Tender over patella and patellar tendon and lat joint line Limited rom  Pt favors other leg   Lymphadenopathy:    She has no  cervical adenopathy.  Neurological: She is alert. She has normal reflexes. No cranial nerve deficit. She exhibits normal muscle tone. Coordination normal.  Skin: Skin is warm and dry. No rash noted.  R 4th toenail is ingrown laterally with some erythema  Diffuse dry skin on feet   Psychiatric: Her mood appears anxious. She exhibits a depressed mood.          Assessment & Plan:   Problem List Items Addressed This Visit     Respiratory   Acute sinusitis     Cover with augmentin    Fluids/ nasal saline as needed Update if not starting to improve in a week or if worsening      Relevant Medications      amoxicillin-clavulanate (AUGMENTIN) tablet 875-125 mg     Endocrine   Type 2 diabetes, controlled, with neuropathy     A1c today    Relevant Orders      Hemoglobin A1c (Completed)      Comprehensive metabolic panel (Completed)     Musculoskeletal and Integument   Ingrown toenail     4th R toenail appears to be ingrown laterally with scant erythema  No pus or drainage  Cover with augmentin - disc soaks and keeping it clean Update if not starting to improve in a week or if worsening        Other   VITAMIN B12 DEFICIENCY     Level today on current supplementation     Relevant Orders      Vitamin B12 (Completed)   Right knee pain - Primary     After injury Pain has returned and worsened Hx of OA as well  No imp with nsaid Using acetaminophen Cannot find a knee brace to fit her - morbidly obese and lymphedema  Will schedule f/u with Dr Roderic Scarce rev-deg change

## 2013-08-11 NOTE — Assessment & Plan Note (Signed)
Level today on current supplementation

## 2013-08-11 NOTE — Progress Notes (Signed)
Pre visit review using our clinic review tool, if applicable. No additional management support is needed unless otherwise documented below in the visit note. 

## 2013-08-11 NOTE — Patient Instructions (Signed)
At check out -please make an appointment with Dr Lorelei Pont for knee pain  Use ice on your knee whenever you can  Keep toenail clean with antibacterial soap and water  Take the augmentin for sinus and toenail infection Update if not starting to improve in a week or if worsening

## 2013-08-11 NOTE — Assessment & Plan Note (Signed)
After injury Pain has returned and worsened Hx of OA as well  No imp with nsaid Using acetaminophen Cannot find a knee brace to fit her - morbidly obese and lymphedema  Will schedule f/u with Dr Roderic Scarce rev-deg change

## 2013-08-11 NOTE — Assessment & Plan Note (Signed)
4th R toenail appears to be ingrown laterally with scant erythema  No pus or drainage  Cover with augmentin - disc soaks and keeping it clean Update if not starting to improve in a week or if worsening

## 2013-08-11 NOTE — Assessment & Plan Note (Signed)
A1c today.  

## 2013-08-11 NOTE — Patient Instructions (Signed)

## 2013-08-11 NOTE — Assessment & Plan Note (Signed)
Cover with augmentin  Fluids/ nasal saline as needed Update if not starting to improve in a week or if worsening

## 2013-08-12 ENCOUNTER — Encounter: Payer: Self-pay | Admitting: Family Medicine

## 2013-08-12 ENCOUNTER — Ambulatory Visit (INDEPENDENT_AMBULATORY_CARE_PROVIDER_SITE_OTHER): Payer: Medicare PPO | Admitting: Family Medicine

## 2013-08-12 VITALS — BP 126/92 | HR 109 | Temp 98.4°F | Ht 64.0 in | Wt 352.8 lb

## 2013-08-12 DIAGNOSIS — IMO0001 Reserved for inherently not codable concepts without codable children: Secondary | ICD-10-CM

## 2013-08-12 DIAGNOSIS — Z6841 Body Mass Index (BMI) 40.0 and over, adult: Secondary | ICD-10-CM

## 2013-08-12 DIAGNOSIS — F411 Generalized anxiety disorder: Secondary | ICD-10-CM

## 2013-08-12 DIAGNOSIS — S8411XA Injury of peroneal nerve at lower leg level, right leg, initial encounter: Secondary | ICD-10-CM

## 2013-08-12 DIAGNOSIS — M1710 Unilateral primary osteoarthritis, unspecified knee: Secondary | ICD-10-CM

## 2013-08-12 DIAGNOSIS — S8011XS Contusion of right lower leg, sequela: Secondary | ICD-10-CM

## 2013-08-12 DIAGNOSIS — S8410XA Injury of peroneal nerve at lower leg level, unspecified leg, initial encounter: Secondary | ICD-10-CM

## 2013-08-12 DIAGNOSIS — I890 Lymphedema, not elsewhere classified: Secondary | ICD-10-CM

## 2013-08-12 DIAGNOSIS — M1711 Unilateral primary osteoarthritis, right knee: Secondary | ICD-10-CM

## 2013-08-12 DIAGNOSIS — F329 Major depressive disorder, single episode, unspecified: Secondary | ICD-10-CM

## 2013-08-12 DIAGNOSIS — T148XXA Other injury of unspecified body region, initial encounter: Secondary | ICD-10-CM

## 2013-08-12 DIAGNOSIS — IMO0002 Reserved for concepts with insufficient information to code with codable children: Secondary | ICD-10-CM

## 2013-08-12 DIAGNOSIS — F3289 Other specified depressive episodes: Secondary | ICD-10-CM

## 2013-08-12 NOTE — Progress Notes (Signed)
Pre visit review using our clinic review tool, if applicable. No additional management support is needed unless otherwise documented below in the visit note. 

## 2013-08-12 NOTE — Progress Notes (Signed)
[address] [place] [address] Phone: [phone] Fax: [phone]  Patient ID: Mackenzie Bonilla MRN: [id number], DOB: 07/20/1965, 48 y.o. Date of Encounter: 08/12/2013  Primary Physician:  Roxy Manns, MD   Chief Complaint: Knee Pain   Subjective:   History of Present Illness:  Mackenzie Bonilla is a 48 y.o. very pleasant female patient who presents with the following:  Dr. Milinda Antis asked me to evaluate the patient's knee pain. This patient quite well, and I did bilateral Synvisc injections on the patient's knees in August of 2012. She had a dramatic, excellent response to these injections, and she has been basically pain-free in her knees until her recent accident last month.  DOI: Fall Saturday evening 07/18/2013  Was hurting and fell down the steps. Was walking in mother's backyard and then out of the blue, fell flat on her face. TMJ is "coming out." She describes falling and striking her knee directly on the ground, she also struck part of her face per report, but she had no sequelae or head injury. She was by herself, and she had to take a significant amount of time to try to reposition herself so she could stand up and RIGHT herself.  Her history is significant for a BMI is 61. When I last saw the patient she did have bilateral significant moderate osteoarthritis of both knees. I reviewed her prior films as well as her 4 view non-weightbearing trauma series.  She also describes when she hit she had a large hematoma that developed on the lateral aspect of her lower extremity. Subsequent to this, the patient's had some very significant numbness and paresthesias on the lateral aspect of her lower extremity.  Past Medical History, Surgical History, Social History, Family History, Problem List, Medications, and Allergies have been reviewed and updated if relevant.  Review of Systems: GEN: no acute illness or fever CV: No chest pain or shortness of breath MSK: detailed  above Neuro: neurological signs are described above O/w per HPI  Objective:   Physical Examination: BP 126/92  Pulse 109  Temp(Src) 98.4 F (36.9 C) (Oral)  Ht 5\' 4"  (1.626 m)  Wt 352 lb 12 oz (160.006 kg)  BMI 60.52 kg/m2   GEN: WDWN, NAD, Non-toxic, Alert & Oriented x 3 HEENT: Atraumatic, Normocephalic.  Ears and Nose: No external deformity. EXTR: 1+ LE edema NEURO: antalgic gait.  PSYCH: Normally interactive. Conversant. Not depressed or anxious appearing.  Flat affect.  RIGHT knee: Compared to the rest there is some mild tenderness at tibial tubercle and medially in the tibia. No significant tenderness in the patellar tendon. The patella was also mildly tender to palpation. The medial joint line is significantly tender. Lateral joint line is also significantly tender, but less than compared to the medial joint line.  On the lateral aspect of the lower extremity the patient has decreased sensation to pinprick and soft touch just distal to the fibular head. This extends throughout most of the lateral lower extremity on the RIGHT.  There is a minimal effusion. Her extension is lacking 2 and she flexes to 117. Bounce home test is negative. McMurray's test is grossly negative. Flexion pinch test is minimally painful.  Medial collateral ligament, lateral collateral ligament, anterior cruciate ligament, and posterior cruciate ligament appear negative and intact on examination.    Radiology: Dg Knee Complete 4 Views Right  07/24/2013   CLINICAL DATA:  Right knee pain status post fall  EXAM: RIGHT KNEE - COMPLETE 4+ VIEW  COMPARISON:  Right knee series of Jul 13, 2010  FINDINGS: The study is degraded due to scatter effects from the overlying soft tissues. The bones are adequately mineralized. There is narrowing of all 3 joint compartments. There is beaking of the tibial spines. There is a lateral tibial plateau osteophyte. There is no joint effusion. Mild soft tissue swelling anteriorly  is present.  IMPRESSION: There is no acute bony abnormality. There are moderate degenerative changes.   Electronically Signed   By: David  [place]   On: 07/24/2013 08:38   The radiological images were independently reviewed by myself in the office and results were reviewed with the patient. My independent interpretation of images:  Intervally, do not think there is any significant change compared to prior films in 2012. There is tricompartmental arthritis, to a moderate degree in all compartments. There is no evidence for occult fracture or dislocation. Multiple osteophytes are present. Electronically Signed  By: Hannah Beat, MD On: 08/13/2013 11:11 AM    Assessment & Plan:   Primary osteoarthritis of right knee  Morbid obesity with body mass index of 60.0-69.9 in adult  LYMPHEDEMA  Bone bruise  Common peroneal nerve dysfunction, right, initial encounter  FIBROMYALGIA  DEPRESSION  ANXIETY  Traumatic hematoma of lower leg, right, sequela  >40 minutes spent in face to face time with patient, >50% spent in counselling or coordination of care: extensive amount of time spent reviewing case and probable cause of injury with the patient. She has multiple questions, and she was anxious and repeated herself sometimes in the office. Using anatomical models, tried to explain what happened to the patient.  It sounds as if the patient had a blunt injury to the lateral aspect of her knee, and she sustained a significant bone bruising throughout most of her knee, patella and proximal tibia. She also developed a hematoma laterally, and from either the blunt trauma or the hematoma has done some trauma to her common peroneal nerve. At this point, this only needs time, and may resolve over time, but this may take on the order of months.  BMI 61 complicates this case and may have led to longer time needed for bone bruise healing with daily ambulation.  Psychiatric co-morbidities may impact pain  response. Pain response greater than anticipated from minimal touch CRPS cannot be excluded. Would only follow this over time.   OA exacerbation suspected as primary pain driver now.   Knee Injection, RIGHT Patient verbally consented to procedure. Risks (including potential rare risk of infection), benefits, and alternatives explained. Sterilely prepped with Chloraprep. Ethyl cholride used for anesthesia. 8 cc Lidocaine 1% mixed with Depo-Medrol 80 mg injected using the anterolateral approach without difficulty with visual confirmation of joint fluid. Initial anteromedial approach unsuccessful. No complications with procedure and tolerated well. Patient had decreased pain post-injection.   Follow-up: 1 month Unless noted above, the patient is to follow-up if symptoms worsen. Red flags were reviewed with the patient.  Signed,  Elpidio Galea. Xander Jutras, MD, [facility]   Discontinued Medications   CYCLOBENZAPRINE (FLEXERIL) 5 MG TABLET    Take 1 tablet (5 mg total) by mouth 2 (two) times daily as needed for muscle spasms.   Current Medications at Discharge:   Medication List       This list is accurate as of: 08/12/13 11:59 PM.  Always use your most recent med list.               acetaminophen 500 MG tablet  Commonly  known as:  TYLENOL  Take 500 mg by mouth every 6 (six) hours as needed. OTC as directed     albuterol 108 (90 BASE) MCG/ACT inhaler  Commonly known as:  PROVENTIL HFA;VENTOLIN HFA  Inhale 2 puffs into the lungs every 4 (four) hours as needed for wheezing.     allopurinol 100 MG tablet  Commonly known as:  ZYLOPRIM  Take 1 tablet (100 mg total) by mouth daily.     ALPRAZolam 1 MG tablet  Commonly known as:  XANAX  Take 2 mg by mouth 3 (three) times daily as needed.     aluminum chloride 20 % external solution  Commonly known as:  DRYSOL  Apply topically at bedtime. Apply to affected areas at bedtime - then wash off in the AM once sweating improves - can use  twice weekly     amoxicillin-clavulanate 875-125 MG per tablet  Commonly known as:  AUGMENTIN  Take 1 tablet by mouth 2 (two) times daily.     benzonatate 200 MG capsule  Commonly known as:  TESSALON  TAKE ONE CAPSULE THREE TIMES A DAY AS NEEDED FOR COUGH--SWALLOW WHOLE--DO NOT CHEW     BRISDELLE 7.5 MG Caps  Generic drug:  PARoxetine Mesylate  Take 7.5 mg by mouth daily.     buPROPion 150 MG 24 hr tablet  Commonly known as:  WELLBUTRIN XL  450 mg daily.     calcium carbonate 500 MG chewable tablet  Commonly known as:  TUMS - dosed in mg elemental calcium  Chew 1 tablet by mouth as needed.     cloNIDine HCl 0.1 MG Tb12 ER tablet  Commonly known as:  KAPVAY  Take by mouth 2 (two) times daily. Take 1 tablet by mouth twice a day     cyanocobalamin 1000 MCG/ML injection  Commonly known as:  (VITAMIN B-12)  Inject 1,000 mcg into the muscle once. 1 ml (1000 mcg) IM as directed every month     desoximetasone 0.25 % cream  Commonly known as:  TOPICORT  Apply topically as needed. Apply as directed     dimenhyDRINATE 50 MG tablet  Commonly known as:  DRAMAMINE  Take 50 mg by mouth every 8 (eight) hours as needed.     FLUoxetine 40 MG capsule  Commonly known as:  PROZAC  Take 80 mg by mouth daily.     fluticasone 50 MCG/ACT nasal spray  Commonly known as:  FLONASE  Place 2 sprays into both nostrils daily.     GILDESS 1/20 1-20 MG-MCG tablet  Generic drug:  norethindrone-ethinyl estradiol  Take 1 tablet by mouth daily.     glycopyrrolate 1 MG tablet  Commonly known as:  ROBINUL  Take 2 tablets by mouth twice daily as needed for abdominal cramping     ketoconazole 2 % cream  Commonly known as:  NIZORAL  Apply topically daily. Apply to affected area once daily for 2 weeks     metFORMIN 500 MG tablet  Commonly known as:  GLUCOPHAGE  Take 1 tablet (500 mg total) by mouth 2 (two) times daily with a meal.     mometasone 0.1 % cream  Commonly known as:  ELOCON  Apply 1  application topically daily. Apply to affected area once daily     naphazoline-glycerin 0.012-0.2 % Soln  Commonly known as:  CLEAR EYES  Place 1-2 drops into both eyes every 4 (four) hours as needed. OTC as directed     nystatin 100000 UNIT/GM Powd  Apply topically 2 (two) times daily as needed. Apply to affected area two times a day until clear asneeded     omeprazole 40 MG capsule  Commonly known as:  PRILOSEC  Take 1 capsule (40 mg total) by mouth 2 (two) times daily.     oxybutynin 15 MG 24 hr tablet  Commonly known as:  DITROPAN XL  Take 1 tablet (15 mg total) by mouth daily.     REFENESEN 400 400 MG Tabs tablet  Generic drug:  guaifenesin  Take 400 mg by mouth every 4 (four) hours as needed. OTC as directed     traMADol 50 MG tablet  Commonly known as:  ULTRAM  Take 1 tablet (50 mg total) by mouth every 8 (eight) hours as needed.     traZODone 50 MG tablet  Commonly known as:  DESYREL  Take 6 tablets  By mouth at bedtime. But can take up to 9 tablets daily     Vitamin D 1000 UNITS capsule  Take 1,000 Units by mouth daily.     ZEASORB powder  Generic drug:  talc  Apply topically as needed.

## 2013-08-12 NOTE — Patient Instructions (Signed)
Flexeril STOP this medication.

## 2013-09-01 ENCOUNTER — Ambulatory Visit: Payer: Medicare PPO | Admitting: Family Medicine

## 2013-09-02 ENCOUNTER — Other Ambulatory Visit: Payer: Self-pay | Admitting: *Deleted

## 2013-09-02 MED ORDER — OMEPRAZOLE 40 MG PO CPDR: 40.0000 mg | DELAYED_RELEASE_CAPSULE | Freq: Two times a day (BID) | ORAL | Status: DC

## 2013-09-03 MED ORDER — OMEPRAZOLE 20 MG PO CPDR: 20.0000 mg | DELAYED_RELEASE_CAPSULE | Freq: Two times a day (BID) | ORAL | Status: DC

## 2013-09-03 NOTE — Addendum Note (Signed)
Addended by: Tammi Sou on: 09/03/2013 01:26 PM   Modules accepted: Orders

## 2013-09-03 NOTE — Telephone Encounter (Signed)
Received fax from [facility] saying pt is taking omeprazole 20mg  BID not 40 mg. They requested new Rx. Rx sent

## 2013-09-08 ENCOUNTER — Ambulatory Visit (INDEPENDENT_AMBULATORY_CARE_PROVIDER_SITE_OTHER): Payer: Medicare PPO | Admitting: Family Medicine

## 2013-09-08 ENCOUNTER — Encounter: Payer: Self-pay | Admitting: Family Medicine

## 2013-09-08 ENCOUNTER — Other Ambulatory Visit: Payer: Self-pay | Admitting: Family Medicine

## 2013-09-08 VITALS — BP 112/74 | HR 101 | Temp 98.7°F | Ht 64.0 in | Wt 349.5 lb

## 2013-09-08 DIAGNOSIS — E785 Hyperlipidemia, unspecified: Secondary | ICD-10-CM

## 2013-09-08 DIAGNOSIS — E1142 Type 2 diabetes mellitus with diabetic polyneuropathy: Secondary | ICD-10-CM

## 2013-09-08 DIAGNOSIS — F410 Panic disorder [episodic paroxysmal anxiety] without agoraphobia: Secondary | ICD-10-CM

## 2013-09-08 DIAGNOSIS — F411 Generalized anxiety disorder: Secondary | ICD-10-CM

## 2013-09-08 DIAGNOSIS — E538 Deficiency of other specified B group vitamins: Secondary | ICD-10-CM

## 2013-09-08 DIAGNOSIS — E1149 Type 2 diabetes mellitus with other diabetic neurological complication: Secondary | ICD-10-CM

## 2013-09-08 DIAGNOSIS — E1140 Type 2 diabetes mellitus with diabetic neuropathy, unspecified: Secondary | ICD-10-CM

## 2013-09-08 DIAGNOSIS — Z6841 Body Mass Index (BMI) 40.0 and over, adult: Secondary | ICD-10-CM

## 2013-09-08 MED ORDER — TALC EX POWD: CUTANEOUS | Status: DC | PRN

## 2013-09-08 MED ORDER — CYANOCOBALAMIN 1000 MCG/ML IJ SOLN
1000.0000 ug | Freq: Once | INTRAMUSCULAR | Status: AC
  Administered 2013-09-08: 1000 ug via INTRAMUSCULAR

## 2013-09-08 MED ORDER — GLIPIZIDE ER 5 MG PO TB24: 5.0000 mg | ORAL_TABLET | Freq: Every day | ORAL | Status: DC

## 2013-09-08 NOTE — Patient Instructions (Signed)
Call your insurance to find out what brand of meter/stips/lancets you need and let us know so I can Px them  Stop at check out for ref to opthamology and also counseling  Start glipizide xl 5 mg 1 pill each am -do not skip meals and also go over your diabetic literature again for diet  Follow up in 3 months with labs prior  B 12 shot today
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
## 2013-09-08 NOTE — Progress Notes (Signed)
Pre visit review using our clinic review tool, if applicable. No additional management support is needed unless otherwise documented below in the visit note. 

## 2013-09-09 NOTE — Assessment & Plan Note (Signed)
Lab Results  Component Value Date   HGBA1C 8.0* 08/11/2013   Intol of metformin  Will try glipizide 5 xl - only if she can keep up with regular meals  Also will call in testing equipt when she knows what brand medicare will cover  Check glucose bid  Will review her literature for DM diet -has worked with dietician in the past

## 2013-09-09 NOTE — Assessment & Plan Note (Signed)
Injection today-she prefers this to pills

## 2013-09-09 NOTE — Assessment & Plan Note (Signed)
LDL almost at goal  Trig high Disc goals for lipids and reasons to control them Rev labs with pt Rev low sat fat diet in detail
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
## 2013-09-09 NOTE — Assessment & Plan Note (Signed)
Discussed how this problem influences overall health and the risks it imposes  Reviewed plan for weight loss with lower calorie diet (via better food choices and also portion control or program like weight watchers) and exercise building up to or more than 30 minutes 5 days per week including some aerobic activity   Pt has emotional / mental health issues that keep her from doing this - disc in detail and will try to connect her with a counselor

## 2013-09-10 ENCOUNTER — Encounter: Payer: Self-pay | Admitting: Family Medicine

## 2013-09-10 ENCOUNTER — Ambulatory Visit (INDEPENDENT_AMBULATORY_CARE_PROVIDER_SITE_OTHER): Payer: Medicare PPO | Admitting: Family Medicine

## 2013-09-10 VITALS — BP 118/78 | HR 108 | Temp 98.8°F | Ht 64.0 in | Wt 349.0 lb

## 2013-09-10 DIAGNOSIS — M1711 Unilateral primary osteoarthritis, right knee: Secondary | ICD-10-CM

## 2013-09-10 DIAGNOSIS — S8411XA Injury of peroneal nerve at lower leg level, right leg, initial encounter: Secondary | ICD-10-CM

## 2013-09-10 DIAGNOSIS — S8011XS Contusion of right lower leg, sequela: Secondary | ICD-10-CM

## 2013-09-10 DIAGNOSIS — T148XXA Other injury of unspecified body region, initial encounter: Secondary | ICD-10-CM

## 2013-09-10 DIAGNOSIS — S8410XA Injury of peroneal nerve at lower leg level, unspecified leg, initial encounter: Secondary | ICD-10-CM

## 2013-09-10 DIAGNOSIS — IMO0002 Reserved for concepts with insufficient information to code with codable children: Secondary | ICD-10-CM

## 2013-09-10 DIAGNOSIS — M1710 Unilateral primary osteoarthritis, unspecified knee: Secondary | ICD-10-CM

## 2013-09-10 NOTE — Progress Notes (Signed)
Pre visit review using our clinic review tool, if applicable. No additional management support is needed unless otherwise documented below in the visit note. 

## 2013-09-10 NOTE — Progress Notes (Signed)
[address] [place] [address] Phone: [phone] Fax: [phone]  Patient ID: Mackenzie Bonilla MRN: [id number], DOB: 03-06-1965, 48 y.o. Date of Encounter: 09/10/2013  Primary Physician:  Roxy Manns, MD   Chief Complaint: Follow-up   Subjective:   History of Present Illness:  Mackenzie Bonilla is a 48 y.o. very pleasant female patient who presents with the following:  F/u R leg and R knee: Overall, she is doing quite a bit better, s/p rest and injection on last OV.  Slipped this morning.   Knee and leg feel much better, pins and needles will come and will bend up to her. Worse then. Sometimes out of the blue.  Sheet will bother it sometimes.   Left knee is hurting her some.    08/12/2013 Last OV with Hannah Beat, MD  Dr. Milinda Antis asked me to evaluate the patient's knee pain. This patient quite well, and I did bilateral Synvisc injections on the patient's knees in August of 2012. She had a dramatic, excellent response to these injections, and she has been basically pain-free in her knees until her recent accident last month.  DOI: Fall Saturday evening 07/18/2013  Was hurting and fell down the steps. Was walking in mother's backyard and then out of the blue, fell flat on her face. TMJ is "coming out." She describes falling and striking her knee directly on the ground, she also struck part of her face per report, but she had no sequelae or head injury. She was by herself, and she had to take a significant amount of time to try to reposition herself so she could stand up and RIGHT herself.  Her history is significant for a BMI is 61. When I last saw the patient she did have bilateral significant moderate osteoarthritis of both knees. I reviewed her prior films as well as her 4 view non-weightbearing trauma series.  She also describes when she hit she had a large hematoma that developed on the lateral aspect of her lower extremity. Subsequent to this, the patient's had  some very significant numbness and paresthesias on the lateral aspect of her lower extremity.  Past Medical History, Surgical History, Social History, Family History, Problem List, Medications, and Allergies have been reviewed and updated if relevant.  Review of Systems: GEN: no acute illness or fever CV: No chest pain or shortness of breath MSK: detailed above Neuro: neurological signs are described above O/w per HPI  Objective:   Physical Examination: BP 118/78  Pulse 108  Temp(Src) 98.8 F (37.1 C) (Oral)  Ht 5\' 4"  (1.626 m)  Wt 349 lb (158.305 kg)  BMI 59.88 kg/m2   GEN: WDWN, NAD, Non-toxic, Alert & Oriented x 3 HEENT: Atraumatic, Normocephalic.  Ears and Nose: No external deformity. EXTR: 1+ LE edema NEURO: antalgic gait.  PSYCH: Normally interactive. Conversant. Not depressed or anxious appearing.  Flat affect.  RIGHT knee: No to mild tenderness at tibial tubercle and medially in the tibia. No significant tenderness in the patellar tendon. The patella NT,  The medial joint line is minimally tender. Lateral joint line is nontender  On the lateral aspect of the lower extremity the patient has decreased sensation to pinprick and soft touch just distal to the fibular head. This extends throughout most of the lateral lower extremity on the RIGHT. Overall, much better  There is a minimal effusion. Her extension is lacking 2 and she flexes to 117. Bounce home test is negative. McMurray's test is grossly negative. Flexion  pinch test is minimally painful.  Medial collateral ligament, lateral collateral ligament, anterior cruciate ligament, and posterior cruciate ligament appear negative and intact on examination.    Radiology: Dg Knee Complete 4 Views Right  07/24/2013   CLINICAL DATA:  Right knee pain status post fall  EXAM: RIGHT KNEE - COMPLETE 4+ VIEW  COMPARISON:  Right knee series of Jul 13, 2010  FINDINGS: The study is degraded due to scatter effects from the overlying  soft tissues. The bones are adequately mineralized. There is narrowing of all 3 joint compartments. There is beaking of the tibial spines. There is a lateral tibial plateau osteophyte. There is no joint effusion. Mild soft tissue swelling anteriorly is present.  IMPRESSION: There is no acute bony abnormality. There are moderate degenerative changes.   Electronically Signed   By: David  [place]   On: 07/24/2013 08:38   The radiological images were independently reviewed by myself in the office and results were reviewed with the patient. My independent interpretation of images:  Intervally, do not think there is any significant change compared to prior films in 2012. There is tricompartmental arthritis, to a moderate degree in all compartments. There is no evidence for occult fracture or dislocation. Multiple osteophytes are present. Electronically Signed  By: Hannah Beat, MD On: 08/13/2013 11:11 AM    Assessment & Plan:   Primary osteoarthritis of right knee  Bone bruise  Common peroneal nerve dysfunction, right, initial encounter  Traumatic hematoma of lower leg, right, sequela  Overall much better.  Suspect nerve trauma at fibular head will take months to resolve - hopefully it will  Follow-up:  Unless noted above, the patient is to follow-up if symptoms worsen. Red flags were reviewed with the patient.  Signed,  Elpidio Galea. Analei Whinery, MD, [facility] Sports Medicine   Discontinued Medications   No medications on file   Current Medications at Discharge:   Medication List       This list is accurate as of: 09/10/13  6:48 PM.  Always use your most recent med list.               acetaminophen 500 MG tablet  Commonly known as:  TYLENOL  Take 500 mg by mouth every 6 (six) hours as needed. OTC as directed     albuterol 108 (90 BASE) MCG/ACT inhaler  Commonly known as:  PROVENTIL HFA;VENTOLIN HFA  Inhale 2 puffs into the lungs every 4 (four) hours as needed for wheezing.      allopurinol 100 MG tablet  Commonly known as:  ZYLOPRIM  Take 1 tablet (100 mg total) by mouth daily.     ALPRAZolam 1 MG tablet  Commonly known as:  XANAX  Take 2 mg by mouth 3 (three) times daily as needed.     aluminum chloride 20 % external solution  Commonly known as:  DRYSOL  Apply topically at bedtime. Apply to affected areas at bedtime - then wash off in the AM once sweating improves - can use twice weekly     benzonatate 200 MG capsule  Commonly known as:  TESSALON  TAKE ONE CAPSULE THREE TIMES A DAY AS NEEDED FOR COUGH--SWALLOW WHOLE--DO NOT CHEW     BRISDELLE 7.5 MG Caps  Generic drug:  PARoxetine Mesylate  Take 7.5 mg by mouth daily.     buPROPion 150 MG 24 hr tablet  Commonly known as:  WELLBUTRIN XL  450 mg daily.     calcium carbonate 500 MG chewable tablet  Commonly  known as:  TUMS - dosed in mg elemental calcium  Chew 1 tablet by mouth as needed.     cyanocobalamin 1000 MCG/ML injection  Commonly known as:  (VITAMIN B-12)  Inject 1,000 mcg into the muscle once a month.     dimenhyDRINATE 50 MG tablet  Commonly known as:  DRAMAMINE  Take 50 mg by mouth every 8 (eight) hours as needed.     FLUoxetine 40 MG capsule  Commonly known as:  PROZAC  Take 80 mg by mouth daily.     fluticasone 50 MCG/ACT nasal spray  Commonly known as:  FLONASE  Place 2 sprays into both nostrils daily.     GILDESS 1/20 1-20 MG-MCG tablet  Generic drug:  norethindrone-ethinyl estradiol  Take 1 tablet by mouth daily.     glipiZIDE 5 MG 24 hr tablet  Commonly known as:  GLUCOTROL XL  Take 1 tablet (5 mg total) by mouth daily with breakfast.     glycopyrrolate 1 MG tablet  Commonly known as:  ROBINUL  Take 2 tablets by mouth twice daily as needed for abdominal cramping     ketoconazole 2 % cream  Commonly known as:  NIZORAL  Apply topically daily. Apply to affected area once daily for 2 weeks     mometasone 0.1 % cream  Commonly known as:  ELOCON  Apply 1 application  topically daily. Apply to affected area once daily     naphazoline-glycerin 0.012-0.2 % Soln  Commonly known as:  CLEAR EYES  Place 1-2 drops into both eyes every 4 (four) hours as needed. OTC as directed     nystatin 100000 UNIT/GM Powd  Apply topically 2 (two) times daily as needed. Apply to affected area two times a day until clear asneeded     omeprazole 20 MG capsule  Commonly known as:  PRILOSEC  Take 1 capsule (20 mg total) by mouth 2 (two) times daily.     oxybutynin 15 MG 24 hr tablet  Commonly known as:  DITROPAN XL  Take 1 tablet (15 mg total) by mouth daily.     REFENESEN 400 400 MG Tabs tablet  Generic drug:  guaifenesin  Take 400 mg by mouth every 4 (four) hours as needed. OTC as directed     talc powder  Commonly known as:  ZEASORB  Apply topically as needed.     traMADol 50 MG tablet  Commonly known as:  ULTRAM  Take 1 tablet (50 mg total) by mouth every 8 (eight) hours as needed.     traZODone 50 MG tablet  Commonly known as:  DESYREL  Take 6 tablets  By mouth at bedtime. But can take up to 9 tablets daily     Vitamin D 1000 UNITS capsule  Take 1,000 Units by mouth daily.

## 2013-09-15 ENCOUNTER — Telehealth: Payer: Self-pay | Admitting: Family Medicine

## 2013-09-15 NOTE — Telephone Encounter (Signed)
Pt needs to know if we got the auth form from [facility] to fill out approving her to get a diabetic meter, test strips, and lancets. Please call pt.

## 2013-09-16 NOTE — Telephone Encounter (Signed)
Pt had not received phone call yet. Spoke to [place] and she stated forms were faxed Monday 09/14/13. Pt informed and will call [employer] in 1 week.

## 2013-09-23 ENCOUNTER — Ambulatory Visit (INDEPENDENT_AMBULATORY_CARE_PROVIDER_SITE_OTHER): Payer: Medicare PPO | Admitting: Psychology

## 2013-09-23 DIAGNOSIS — F331 Major depressive disorder, recurrent, moderate: Secondary | ICD-10-CM

## 2013-10-13 ENCOUNTER — Ambulatory Visit (INDEPENDENT_AMBULATORY_CARE_PROVIDER_SITE_OTHER): Payer: Medicare PPO | Admitting: Psychology

## 2013-10-13 DIAGNOSIS — F331 Major depressive disorder, recurrent, moderate: Secondary | ICD-10-CM

## 2013-10-16 ENCOUNTER — Ambulatory Visit: Payer: Medicare PPO | Admitting: Podiatry

## 2013-10-20 ENCOUNTER — Ambulatory Visit (INDEPENDENT_AMBULATORY_CARE_PROVIDER_SITE_OTHER): Payer: Medicare PPO | Admitting: Psychology

## 2013-10-20 ENCOUNTER — Other Ambulatory Visit: Payer: Self-pay | Admitting: Family Medicine

## 2013-10-20 DIAGNOSIS — F331 Major depressive disorder, recurrent, moderate: Secondary | ICD-10-CM

## 2013-10-20 NOTE — Telephone Encounter (Signed)
Will refill electronically  

## 2013-10-20 NOTE — Telephone Encounter (Signed)
Electronic refill request, please advise  

## 2013-10-21 ENCOUNTER — Ambulatory Visit: Payer: Medicare PPO | Admitting: Family Medicine

## 2013-10-21 ENCOUNTER — Ambulatory Visit (INDEPENDENT_AMBULATORY_CARE_PROVIDER_SITE_OTHER): Payer: Medicare PPO | Admitting: Family Medicine

## 2013-10-21 ENCOUNTER — Encounter: Payer: Self-pay | Admitting: Family Medicine

## 2013-10-21 VITALS — BP 118/78 | HR 106 | Temp 98.3°F | Ht 64.0 in | Wt 350.2 lb

## 2013-10-21 DIAGNOSIS — H6504 Acute serous otitis media, recurrent, right ear: Secondary | ICD-10-CM

## 2013-10-21 DIAGNOSIS — H6500 Acute serous otitis media, unspecified ear: Secondary | ICD-10-CM

## 2013-10-21 DIAGNOSIS — H6691 Otitis media, unspecified, right ear: Secondary | ICD-10-CM | POA: Insufficient documentation

## 2013-10-21 DIAGNOSIS — J019 Acute sinusitis, unspecified: Secondary | ICD-10-CM

## 2013-10-21 DIAGNOSIS — B9689 Other specified bacterial agents as the cause of diseases classified elsewhere: Secondary | ICD-10-CM | POA: Insufficient documentation

## 2013-10-21 MED ORDER — AMOXICILLIN-POT CLAVULANATE 875-125 MG PO TABS: 1.0000 | ORAL_TABLET | Freq: Two times a day (BID) | ORAL | Status: DC

## 2013-10-21 NOTE — Assessment & Plan Note (Signed)
With sinusitis Cover with augmentin  Update if not starting to improve in a week or if worsening

## 2013-10-21 NOTE — Progress Notes (Signed)
Subjective:    Patient ID: Mackenzie Bonilla, female    DOB: Jul 17, 1965, 48 y.o.   MRN: [id number]  HPI Here for "another sinus infection"  2 weeks  Green nasal discharge  Dry packed nose and congestion- pain behind eyes  R ear is uncomfortable and also full  Cannot sleep - tries to stay propped up  Cannot breathe out of her nose   Has not checked temp  Feels lousy -like a fever  No chills  Patient Active Problem List   Diagnosis Date Noted  . Acute sinusitis 08/11/2013  . Ingrown toenail 08/11/2013  . Right knee pain 07/23/2013  . Fall 07/23/2013  . Insect bite 06/05/2013  . Great toe pain 05/12/2013  . Medication management 11/20/2012  . Abdominal pain, unspecified site 04/23/2012  . Uric acid kidney stone 11/20/2011  . HYPERHIDROSIS 11/07/2009  . HOT FLASHES 06/17/2009  . Type 2 diabetes, controlled, with neuropathy 08/23/2008  . VITAMIN D DEFICIENCY 05/07/2008  . VITAMIN B12 DEFICIENCY 06/04/2007  . CHRONIC FATIGUE SYNDROME 11/15/2006  . DIVERTICULITIS, HX OF 11/15/2006  . POLYCYSTIC OVARIAN DISEASE 10/16/2006  . HYPERLIPIDEMIA 10/16/2006  . Morbid obesity with body mass index of 60.0-69.9 in adult 10/16/2006  . ANXIETY 10/16/2006  . PANIC DISORDER 10/16/2006  . BULIMIA 10/16/2006  . DEPRESSION 10/16/2006  . CARPAL TUNNEL SYNDROME 10/16/2006  . LYMPHEDEMA 10/16/2006  . ALLERGIC RHINITIS 10/16/2006  . ASTHMA, MILD, INTERMITTENT 10/16/2006  . TMJ SYNDROME 10/16/2006  . GERD 10/16/2006  . IRRITABLE BOWEL SYNDROME 10/16/2006  . OSTEOARTHRITIS 10/16/2006  . FIBROMYALGIA 10/16/2006  . COUGH, CHRONIC 10/16/2006   Past Medical History  Diagnosis Date  . Allergy     allergic rhinitis  . Anxiety   . Depression   . Hyperlipidemia   . Arthritis     osteoarthritis  . Obesity   . Lymphedema   . Tachycardia   . Neuromuscular disorder     fibromyalgia  . Hyperhidrosis   . GERD (gastroesophageal reflux disease)     EGD negative 06/2001// EGD erythematous  mucosa, polyp 08/2008  . Fatty liver 07/2008    abd. ultrasound - fatty liver ; slt dilated cbd (no stones ) 06/10// abd.  ultrasound normal on 04/2006  . Diabetes mellitus without complication    Past Surgical History  Procedure Laterality Date  . Tonsillectomy    . Septoplasty    . Foot surgery    . Uterine tumor  09/2001  . Vagus nerve stimulator insertion    . Endometrial biopsy  01/2004  . Uterine fibroid surgery  06/2005    ablation   History  Substance Use Topics  . Smoking status: Never Smoker   . Smokeless tobacco: Never Used  . Alcohol Use: No   Family History  Problem Relation Age of Onset  . Hypertension Father   . Cancer Father     pancreatic cancer  . Hypertension Sister   . Heart disease Maternal Grandmother 60    MI  . Heart disease Paternal Grandmother 98    MI   Allergies  Allergen Reactions  . Cephalexin   . Codeine     REACTION: ? reaction  . Duloxetine   . Hydrocodone     REACTION: ? reaction  . Levaquin [Levofloxacin] Nausea Only  . Metformin And Related Diarrhea    GI side effects   . Norelgestromin-Eth Estradiol   . Prednisone     Tolerates intraarticular depo-medrol.  . Quetiapine   . Sertraline Hcl  REACTION: vivid dreams  . Triazolam     REACTION: ? reaction  . Zaleplon   . Zocor [Simvastatin - High Dose]     achey  . Zolpidem Tartrate     REACTION: ? reaction   Current Outpatient Prescriptions on File Prior to Visit  Medication Sig Dispense Refill  . acetaminophen (TYLENOL) 500 MG tablet Take 500 mg by mouth every 6 (six) hours as needed. OTC as directed       . albuterol (PROVENTIL HFA;VENTOLIN HFA) 108 (90 BASE) MCG/ACT inhaler Inhale 2 puffs into the lungs every 4 (four) hours as needed for wheezing.      Marland Kitchen allopurinol (ZYLOPRIM) 100 MG tablet Take 1 tablet (100 mg total) by mouth daily.  30 tablet  11  . ALPRAZolam (XANAX) 1 MG tablet Take 2 mg by mouth 3 (three) times daily as needed.       Marland Kitchen aluminum chloride  (DRYSOL) 20 % external solution Apply topically at bedtime. Apply to affected areas at bedtime - then wash off in the AM once sweating improves - can use twice weekly       . benzonatate (TESSALON) 200 MG capsule TAKE ONE CAPSULE THREE TIMES A DAY AS NEEDED FOR COUGH--SWALLOW WHOLE--DO NOT CHEW  90 capsule  3  . buPROPion (WELLBUTRIN XL) 150 MG 24 hr tablet 450 mg daily.       . calcium carbonate (TUMS - DOSED IN MG ELEMENTAL CALCIUM) 500 MG chewable tablet Chew 1 tablet by mouth as needed.      . Cholecalciferol (VITAMIN D) 1000 UNITS capsule Take 1,000 Units by mouth daily.        . cyanocobalamin (,VITAMIN B-12,) 1000 MCG/ML injection Inject 1,000 mcg into the muscle once a month.  1 mL  5  . cyclobenzaprine (FLEXERIL) 5 MG tablet TAKE 1 TABLET 3 TIMES DAILY AS NEEDED FOR MUSCLE SPASM  30 tablet  1  . dimenhyDRINATE (DRAMAMINE) 50 MG tablet Take 50 mg by mouth every 8 (eight) hours as needed.      Marland Kitchen FLUoxetine (PROZAC) 40 MG capsule Take 80 mg by mouth daily.       . fluticasone (FLONASE) 50 MCG/ACT nasal spray Place 2 sprays into both nostrils daily.  16 g  6  . glipiZIDE (GLUCOTROL XL) 5 MG 24 hr tablet Take 1 tablet (5 mg total) by mouth daily with breakfast.  30 tablet  3  . glycopyrrolate (ROBINUL) 1 MG tablet Take 2 tablets by mouth twice daily as needed for abdominal cramping  120 tablet  5  . guaifenesin (REFENESEN 400) 400 MG TABS Take 400 mg by mouth every 4 (four) hours as needed. OTC as directed      . ketoconazole (NIZORAL) 2 % cream Apply topically daily. Apply to affected area once daily for 2 weeks       . mometasone (ELOCON) 0.1 % cream Apply 1 application topically daily. Apply to affected area once daily  15 g  0  . naphazoline-glycerin (CLEAR EYES) 0.012-0.2 % SOLN Place 1-2 drops into both eyes every 4 (four) hours as needed. OTC as directed       . norethindrone-ethinyl estradiol (GILDESS 1/20) 1-20 MG-MCG tablet Take 1 tablet by mouth daily.      Marland Kitchen nystatin (NYSTOP) 100000  UNIT/GM POWD Apply topically 2 (two) times daily as needed. Apply to affected area two times a day until clear asneeded       . omeprazole (PRILOSEC) 20 MG capsule Take 1  capsule (20 mg total) by mouth 2 (two) times daily.  60 capsule  5  . oxybutynin (DITROPAN XL) 15 MG 24 hr tablet Take 1 tablet (15 mg total) by mouth daily.  30 tablet  11  . PARoxetine Mesylate (BRISDELLE) 7.5 MG CAPS Take 7.5 mg by mouth daily.      Marland Kitchen talc (ZEASORB) powder Apply topically as needed.  240 g  3  . traMADol (ULTRAM) 50 MG tablet Take 1 tablet (50 mg total) by mouth every 8 (eight) hours as needed.  60 tablet  3  . traZODone (DESYREL) 50 MG tablet Take 6 tablets  By mouth at bedtime. But can take up to 9 tablets daily        No current facility-administered medications on file prior to visit.       Review of Systems Review of Systems  Constitutional: Negative for fever, appetite change,  and unexpected weight change.  ENT pos for congestion/ facial pain/ ear pain and purulent nasal drainage Eyes: Negative for pain and visual disturbance.  Respiratory: Negative for wheeze and shortness of breath.   Cardiovascular: Negative for cp or palpitations    Gastrointestinal: Negative for nausea, diarrhea and constipation.  Genitourinary: Negative for urgency and frequency.  Skin: Negative for pallor or rash   Neurological: Negative for weakness, light-headedness, numbness and headaches.  Hematological: Negative for adenopathy. Does not bruise/bleed easily.  Psychiatric/Behavioral: pos for dysphoric mood. The patient is  nervous/anxious.         Objective:   Physical Exam  Constitutional: She appears well-developed and well-nourished. No distress.  Morbidly obese and well appearing   HENT:  Head: Normocephalic and atraumatic.  Mouth/Throat: Oropharynx is clear and moist. No oropharyngeal exudate.  Nares are injected and congested   bilat ethmoid and L maxillary tenderness R TM is erythematous with effusion  and bulging    Eyes: Conjunctivae and EOM are normal. Pupils are equal, round, and reactive to light. Right eye exhibits no discharge. Left eye exhibits no discharge. No scleral icterus.  Neck: Normal range of motion. Neck supple.  Cardiovascular: Normal rate, regular rhythm and normal heart sounds.   Pulmonary/Chest: Effort normal and breath sounds normal. No respiratory distress. She has no wheezes. She has no rales.  Lymphadenopathy:    She has no cervical adenopathy.  Neurological: She is alert.  Skin: Skin is warm and dry. No rash noted.  Psychiatric: She has a normal mood and affect.          Assessment & Plan:   Problem List Items Addressed This Visit     Respiratory   Acute bacterial sinusitis - Primary     With R OM as well Remote hx of nasal surgery- ? If this predisposes her  augmentin as directed Disc symptomatic care - see instructions on AVS  Update if not starting to improve in a week or if worsening      Relevant Medications      amoxicillin-clavulanate (AUGMENTIN) tablet 875-125 mg     Nervous and Auditory   Right otitis media     With sinusitis Cover with augmentin  Update if not starting to improve in a week or if worsening       Relevant Medications      amoxicillin-clavulanate (AUGMENTIN) tablet 875-125 mg

## 2013-10-21 NOTE — Assessment & Plan Note (Signed)
With R OM as well Remote hx of nasal surgery- ? If this predisposes her  augmentin as directed Disc symptomatic care - see instructions on AVS  Update if not starting to improve in a week or if worsening

## 2013-10-21 NOTE — Patient Instructions (Signed)
Take augmentin as directed for sinusitis mucinex may help congestion  Breathe steam  Warm compresses on face can help also  Update if not starting to improve in a week or if worsening

## 2013-10-21 NOTE — Progress Notes (Signed)
Pre visit review using our clinic review tool, if applicable. No additional management support is needed unless otherwise documented below in the visit note. 

## 2013-10-30 ENCOUNTER — Ambulatory Visit (INDEPENDENT_AMBULATORY_CARE_PROVIDER_SITE_OTHER): Payer: Medicare PPO | Admitting: Podiatry

## 2013-10-30 DIAGNOSIS — M79609 Pain in unspecified limb: Secondary | ICD-10-CM

## 2013-10-30 DIAGNOSIS — M79676 Pain in unspecified toe(s): Secondary | ICD-10-CM

## 2013-10-30 DIAGNOSIS — B351 Tinea unguium: Secondary | ICD-10-CM

## 2013-10-30 NOTE — Progress Notes (Signed)
Subjective:  °  ° Patient ID: Mackenzie Bonilla, female   DOB: 07/10/1965, 48 y.o.   MRN: [id number] ° °HPI patient presents with incurvated painful nailbeds 1-5 both feet that she cannot reach or cut ° ° °Review of Systems ° °   °Objective:  ° Physical Exam °Neurovascular status unchanged with thick incurvated nail bed 1-5 both feet that are painful °   °Assessment:  °   °Mycotic nail infection is with pain 1-5 both feet °   °Plan:  °   °Debris painful nailbeds 1-5 both feet with no iatrogenic bleeding noted °   ° ° °

## 2013-11-04 ENCOUNTER — Ambulatory Visit (INDEPENDENT_AMBULATORY_CARE_PROVIDER_SITE_OTHER): Payer: Medicare PPO | Admitting: Psychology

## 2013-11-04 ENCOUNTER — Telehealth: Payer: Self-pay

## 2013-11-04 DIAGNOSIS — F331 Major depressive disorder, recurrent, moderate: Secondary | ICD-10-CM

## 2013-11-04 DIAGNOSIS — H6504 Acute serous otitis media, recurrent, right ear: Secondary | ICD-10-CM

## 2013-11-04 NOTE — Telephone Encounter (Signed)
Pt left note; pt seeing Dr Rexene Edison today from 12:45 - 1:45 pm; pt did not want to make f/u appt; pt finished antibiotic 2 weeks ago;rt ear still has pain and rt side of face hurts including jaw; pt is also dizzy.Please advise.

## 2013-11-04 NOTE — Telephone Encounter (Signed)
I want to ref her to ENT for further eval  Make sure she is using her flonase  Referral done

## 2013-11-04 NOTE — Telephone Encounter (Signed)
Pt stopped on way out of office and advised as instructed; pt said she cannot use flonase because it causes her to get sinus infection. Pt will wait for pt care coordinator call and pt wants to see young Dr Pryor Ochoa at [facility].

## 2013-11-04 NOTE — Telephone Encounter (Signed)
I will route to [facility], thanks

## 2013-11-06 ENCOUNTER — Ambulatory Visit: Payer: Medicare PPO

## 2013-12-02 ENCOUNTER — Other Ambulatory Visit (INDEPENDENT_AMBULATORY_CARE_PROVIDER_SITE_OTHER): Payer: Medicare PPO

## 2013-12-02 DIAGNOSIS — E785 Hyperlipidemia, unspecified: Secondary | ICD-10-CM

## 2013-12-02 DIAGNOSIS — E1140 Type 2 diabetes mellitus with diabetic neuropathy, unspecified: Secondary | ICD-10-CM

## 2013-12-02 LAB — HEMOGLOBIN A1C: Hgb A1c MFr Bld: 7.6 % — ABNORMAL HIGH (ref 4.6–6.5)

## 2013-12-03 LAB — LIPID PANEL
Cholesterol: 221 mg/dL — ABNORMAL HIGH (ref 0–200)
HDL: 40.2 mg/dL (ref >39.00)
NonHDL: 180.8
Total CHOL/HDL Ratio: 5
Triglycerides: 392 mg/dL — ABNORMAL HIGH (ref 0.0–149.0)
VLDL: 78.4 mg/dL — ABNORMAL HIGH (ref 0.0–40.0)

## 2013-12-03 LAB — COMPREHENSIVE METABOLIC PANEL
ALT: 15 U/L (ref 0–35)
AST: 20 U/L (ref 0–37)
Albumin: 3.1 g/dL — ABNORMAL LOW (ref 3.5–5.2)
Alkaline Phosphatase: 45 U/L (ref 39–117)
BILIRUBIN TOTAL: 0.6 mg/dL (ref 0.2–1.2)
BUN: 9 mg/dL (ref 6–23)
CO2: 29 mEq/L (ref 19–32)
Calcium: 9.3 mg/dL (ref 8.4–10.5)
Chloride: 102 mEq/L (ref 96–112)
Creatinine, Ser: 1 mg/dL (ref 0.4–1.2)
GFR: 65.83 mL/min (ref >60.00)
GLUCOSE: 185 mg/dL — AB (ref 70–99)
Potassium: 4.5 mEq/L (ref 3.5–5.1)
SODIUM: 139 meq/L (ref 135–145)
Total Protein: 7 g/dL (ref 6.0–8.3)

## 2013-12-03 LAB — LDL CHOLESTEROL, DIRECT: Direct LDL: 124.3 mg/dL

## 2013-12-04 ENCOUNTER — Encounter: Payer: Self-pay | Admitting: *Deleted

## 2013-12-08 ENCOUNTER — Ambulatory Visit: Payer: Medicare PPO | Admitting: Psychology

## 2013-12-09 ENCOUNTER — Ambulatory Visit: Payer: Medicare PPO | Admitting: Family Medicine

## 2013-12-09 ENCOUNTER — Ambulatory Visit: Payer: Medicare PPO | Admitting: Psychology

## 2013-12-16 ENCOUNTER — Encounter: Payer: Self-pay | Admitting: Family Medicine

## 2013-12-16 ENCOUNTER — Ambulatory Visit (INDEPENDENT_AMBULATORY_CARE_PROVIDER_SITE_OTHER): Payer: Medicare PPO | Admitting: Family Medicine

## 2013-12-16 VITALS — BP 128/78 | HR 99 | Temp 98.5°F | Ht 64.0 in | Wt 356.5 lb

## 2013-12-16 DIAGNOSIS — E785 Hyperlipidemia, unspecified: Secondary | ICD-10-CM

## 2013-12-16 DIAGNOSIS — E1140 Type 2 diabetes mellitus with diabetic neuropathy, unspecified: Secondary | ICD-10-CM

## 2013-12-16 DIAGNOSIS — Z6841 Body Mass Index (BMI) 40.0 and over, adult: Secondary | ICD-10-CM

## 2013-12-16 NOTE — Progress Notes (Signed)
Subjective:    Patient ID: Mackenzie Bonilla, female    DOB: 23-Oct-1965, 48 y.o.   MRN: [id number]  HPI Here for f/u of DM and lipids and obesity and other chronic health problems   Having TMJ problems - went to ENT and dentist and chiropractor and also cracked a back tooth on the R  Has a temporary tooth in  Has a mouth guard  Still having pain on that side  Naproxen helps pain but it makes her "dazed"   Wt is up 6 lb  Morbidly obese with bmi of 61 She has not been able to loose wt due to psychologic problems   Diabetes Home sugar results -had a low sugar at the beginning and now stabilized  Having been testing blood sugars  DM diet - stays on DM diet about 1/2 the time  Exercise not much  No symptoms -nocturia times 2  A1C last  Lab Results  Component Value Date   HGBA1C 7.6* 12/02/2013  improved from 8  No problems with medications  - put on glipizide -  Renal protection not on ace or arb -intolerant  Last eye exam -needs to get her eye exam (but insurance will not cover it)  Had her flu shot  Hyperlipidemia is worse Lab Results  Component Value Date   CHOL 221* 12/02/2013   CHOL 188 04/22/2012   CHOL 210* 02/11/2012   Lab Results  Component Value Date   HDL 40.20 12/02/2013   HDL 37.90* 04/22/2012   HDL 42.10 02/11/2012   Lab Results  Component Value Date   LDLCALC 44 06/17/2009   LDLCALC 94 08/23/2008   LDLCALC 68 05/17/2008   Lab Results  Component Value Date   TRIG 392.0* 12/02/2013   TRIG 286.0* 04/22/2012   TRIG 280.0* 02/11/2012   Lab Results  Component Value Date   CHOLHDL 5 12/02/2013   CHOLHDL 5 04/22/2012   CHOLHDL 5 02/11/2012   Lab Results  Component Value Date   LDLDIRECT 124.3 12/02/2013   LDLDIRECT 106.1 04/22/2012   LDLDIRECT 132.9 02/11/2012     ? If eating a little differently  Eating at different times  No red meat , occ fried chicken and cheese hot dogs  Has taken zocor -gave her myalgias , she wants to avoid other  statins   Patient Active Problem List   Diagnosis Date Noted  . Acute bacterial sinusitis 10/21/2013  . Right otitis media 10/21/2013  . Ingrown toenail 08/11/2013  . Right knee pain 07/23/2013  . Fall 07/23/2013  . Insect bite 06/05/2013  . Great toe pain 05/12/2013  . Medication management 11/20/2012  . Abdominal pain, unspecified site 04/23/2012  . Uric acid kidney stone 11/20/2011  . HYPERHIDROSIS 11/07/2009  . HOT FLASHES 06/17/2009  . Type 2 diabetes, controlled, with neuropathy 08/23/2008  . VITAMIN D DEFICIENCY 05/07/2008  . VITAMIN B12 DEFICIENCY 06/04/2007  . CHRONIC FATIGUE SYNDROME 11/15/2006  . DIVERTICULITIS, HX OF 11/15/2006  . POLYCYSTIC OVARIAN DISEASE 10/16/2006  . Hyperlipidemia LDL goal <100 10/16/2006  . Morbid obesity with body mass index of 60.0-69.9 in adult 10/16/2006  . ANXIETY 10/16/2006  . PANIC DISORDER 10/16/2006  . BULIMIA 10/16/2006  . DEPRESSION 10/16/2006  . CARPAL TUNNEL SYNDROME 10/16/2006  . LYMPHEDEMA 10/16/2006  . ALLERGIC RHINITIS 10/16/2006  . ASTHMA, MILD, INTERMITTENT 10/16/2006  . TMJ SYNDROME 10/16/2006  . GERD 10/16/2006  . IRRITABLE BOWEL SYNDROME 10/16/2006  . OSTEOARTHRITIS 10/16/2006  . FIBROMYALGIA 10/16/2006  . COUGH, CHRONIC  10/16/2006   Past Medical History  Diagnosis Date  . Allergy     allergic rhinitis  . Anxiety   . Depression   . Hyperlipidemia   . Arthritis     osteoarthritis  . Obesity   . Lymphedema   . Tachycardia   . Neuromuscular disorder     fibromyalgia  . Hyperhidrosis   . GERD (gastroesophageal reflux disease)     EGD negative 06/2001// EGD erythematous mucosa, polyp 08/2008  . Fatty liver 07/2008    abd. ultrasound - fatty liver ; slt dilated cbd (no stones ) 06/10// abd.  ultrasound normal on 04/2006  . Diabetes mellitus without complication    Past Surgical History  Procedure Laterality Date  . Tonsillectomy    . Septoplasty    . Foot surgery    . Uterine tumor  09/2001  . Vagus  nerve stimulator insertion    . Endometrial biopsy  01/2004  . Uterine fibroid surgery  06/2005    ablation   History  Substance Use Topics  . Smoking status: Never Smoker   . Smokeless tobacco: Never Used  . Alcohol Use: No   Family History  Problem Relation Age of Onset  . Hypertension Father   . Cancer Father     pancreatic cancer  . Hypertension Sister   . Heart disease Maternal Grandmother 60    MI  . Heart disease Paternal Grandmother 5    MI   Allergies  Allergen Reactions  . Cephalexin   . Codeine     REACTION: ? reaction  . Duloxetine   . Hydrocodone     REACTION: ? reaction  . Levaquin [Levofloxacin] Nausea Only  . Metformin And Related Diarrhea    GI side effects   . Norelgestromin-Eth Estradiol   . Prednisone     Tolerates intraarticular depo-medrol.  . Quetiapine   . Sertraline Hcl     REACTION: vivid dreams  . Triazolam     REACTION: ? reaction  . Zaleplon   . Zocor [Simvastatin - High Dose]     achey  . Zolpidem Tartrate     REACTION: ? reaction   Current Outpatient Prescriptions on File Prior to Visit  Medication Sig Dispense Refill  . acetaminophen (TYLENOL) 500 MG tablet Take 500 mg by mouth every 6 (six) hours as needed. OTC as directed     . albuterol (PROVENTIL HFA;VENTOLIN HFA) 108 (90 BASE) MCG/ACT inhaler Inhale 2 puffs into the lungs every 4 (four) hours as needed for wheezing.    Marland Kitchen allopurinol (ZYLOPRIM) 100 MG tablet Take 1 tablet (100 mg total) by mouth daily. 30 tablet 11  . ALPRAZolam (XANAX) 1 MG tablet Take 2 mg by mouth 3 (three) times daily as needed.     Marland Kitchen aluminum chloride (DRYSOL) 20 % external solution Apply topically at bedtime. Apply to affected areas at bedtime - then wash off in the AM once sweating improves - can use twice weekly     . amoxicillin-clavulanate (AUGMENTIN) 875-125 MG per tablet Take 1 tablet by mouth 2 (two) times daily. 20 tablet 0  . benzonatate (TESSALON) 200 MG capsule TAKE ONE CAPSULE THREE TIMES A  DAY AS NEEDED FOR COUGH--SWALLOW WHOLE--DO NOT CHEW 90 capsule 3  . buPROPion (WELLBUTRIN XL) 150 MG 24 hr tablet 450 mg daily.     . calcium carbonate (TUMS - DOSED IN MG ELEMENTAL CALCIUM) 500 MG chewable tablet Chew 1 tablet by mouth as needed.    . Cholecalciferol (  VITAMIN D) 1000 UNITS capsule Take 1,000 Units by mouth daily.      . cyanocobalamin (,VITAMIN B-12,) 1000 MCG/ML injection Inject 1,000 mcg into the muscle once a month. 1 mL 5  . cyclobenzaprine (FLEXERIL) 5 MG tablet TAKE 1 TABLET 3 TIMES DAILY AS NEEDED FOR MUSCLE SPASM 30 tablet 1  . dimenhyDRINATE (DRAMAMINE) 50 MG tablet Take 50 mg by mouth every 8 (eight) hours as needed.    Marland Kitchen FLUoxetine (PROZAC) 40 MG capsule Take 80 mg by mouth daily.     . fluticasone (FLONASE) 50 MCG/ACT nasal spray Place 2 sprays into both nostrils daily. 16 g 6  . glipiZIDE (GLUCOTROL XL) 5 MG 24 hr tablet Take 1 tablet (5 mg total) by mouth daily with breakfast. 30 tablet 3  . glycopyrrolate (ROBINUL) 1 MG tablet Take 2 tablets by mouth twice daily as needed for abdominal cramping 120 tablet 5  . guaifenesin (REFENESEN 400) 400 MG TABS Take 400 mg by mouth every 4 (four) hours as needed. OTC as directed    . ketoconazole (NIZORAL) 2 % cream Apply topically daily. Apply to affected area once daily for 2 weeks     . mometasone (ELOCON) 0.1 % cream Apply 1 application topically daily. Apply to affected area once daily 15 g 0  . naphazoline-glycerin (CLEAR EYES) 0.012-0.2 % SOLN Place 1-2 drops into both eyes every 4 (four) hours as needed. OTC as directed     . norethindrone-ethinyl estradiol (GILDESS 1/20) 1-20 MG-MCG tablet Take 1 tablet by mouth daily.    Marland Kitchen nystatin (NYSTOP) 100000 UNIT/GM POWD Apply topically 2 (two) times daily as needed. Apply to affected area two times a day until clear asneeded     . omeprazole (PRILOSEC) 20 MG capsule Take 1 capsule (20 mg total) by mouth 2 (two) times daily. 60 capsule 5  . oxybutynin (DITROPAN XL) 15 MG 24 hr  tablet Take 1 tablet (15 mg total) by mouth daily. 30 tablet 11  . PARoxetine Mesylate (BRISDELLE) 7.5 MG CAPS Take 7.5 mg by mouth daily.    Marland Kitchen talc (ZEASORB) powder Apply topically as needed. 240 g 3  . traMADol (ULTRAM) 50 MG tablet Take 1 tablet (50 mg total) by mouth every 8 (eight) hours as needed. 60 tablet 3  . traZODone (DESYREL) 50 MG tablet Take 6 tablets  By mouth at bedtime. But can take up to 9 tablets daily      No current facility-administered medications on file prior to visit.    Review of Systems Review of Systems  Constitutional: Negative for fever, appetite change,  and unexpected weight change.  Eyes: Negative for pain and visual disturbance.  ENT pos for TMJ pain  Respiratory: Negative for cough and shortness of breath.   Cardiovascular: Negative for cp or palpitations    Gastrointestinal: Negative for nausea, diarrhea and constipation.  Genitourinary: Negative for urgency and frequency.  Skin: Negative for pallor or rash   Neurological: Negative for weakness, light-headedness, numbness and headaches.  Hematological: Negative for adenopathy. Does not bruise/bleed easily.  Psychiatric/Behavioral: pos for baseline poorly controlled depression and anxiety         Objective:   Physical Exam  Constitutional: She appears well-developed and well-nourished. No distress.  Morbidly obese and well app  HENT:  Head: Normocephalic and atraumatic.  Mouth/Throat: Oropharynx is clear and moist.  Eyes: Conjunctivae and EOM are normal. Pupils are equal, round, and reactive to light. No scleral icterus.  Neck: Normal range of motion.  Neck supple. No JVD present. Carotid bruit is not present. No thyromegaly present.  Cardiovascular: Normal rate, regular rhythm and normal heart sounds.   Pulmonary/Chest: Effort normal and breath sounds normal. No respiratory distress. She has no wheezes. She has no rales.  Musculoskeletal: She exhibits no edema.  Lymphadenopathy:    She has no  cervical adenopathy.  Neurological: She is alert. She has normal reflexes. No cranial nerve deficit. She exhibits normal muscle tone. Coordination normal.  Skin: Skin is warm and dry. No rash noted. No erythema. No pallor.  Psychiatric: Her mood appears anxious. She is slowed. She exhibits a depressed mood.          Assessment & Plan:

## 2013-12-16 NOTE — Patient Instructions (Signed)
Check your glucose twice daily (am fasting and 2 hours after a meal)- send me some results in about 2 weeks so I know whether to increase your glipizide  Try to eat a diabetic diet  For cholesterol - also a low fat diet (Avoid red meat/ fried foods/ egg yolks/ fatty breakfast meats/ butter, cheese and high fat dairy/ and shellfish ) Don't forget to schedule an annual diabetic eye exam   Follow up in 3 months with labs prior

## 2013-12-16 NOTE — Progress Notes (Signed)
Pre visit review using our clinic review tool, if applicable. No additional management support is needed unless otherwise documented below in the visit note. 

## 2013-12-17 NOTE — Assessment & Plan Note (Signed)
Cholesterol is up  Disc goals for lipids and reasons to control them Rev labs with pt Rev low sat fat diet in detail  Intol of simvastatin in the past - she declines other statins  Rev diet and she will try to make some changes F.u 3 mo with labs prior

## 2013-12-17 NOTE — Assessment & Plan Note (Signed)
A1C is improved with glipizide though wt has gone up  Noncompliant with diet and glucose monitoring  Asked pt to check glucose bid and prn and send results in 2 wk  May inc dose if no hypoglycemia Rev low glycemic diet and need for wt loss and exercise  Enc to make her eye exam appt when she can afford to

## 2013-12-17 NOTE — Assessment & Plan Note (Signed)
Again-this fuels her other health problems  Very difficult for her to loose in light of her mood disorder issues however Discussed how this problem influences overall health and the risks it imposes  Reviewed plan for weight loss with lower calorie diet (via better food choices and also portion control or program like weight watchers) and exercise building up to or more than 30 minutes 5 days per week including some aerobic activity   Enc her to do her best

## 2013-12-22 ENCOUNTER — Telehealth: Payer: Self-pay | Admitting: *Deleted

## 2013-12-22 ENCOUNTER — Ambulatory Visit (INDEPENDENT_AMBULATORY_CARE_PROVIDER_SITE_OTHER): Payer: Medicare PPO | Admitting: Psychology

## 2013-12-22 DIAGNOSIS — Z6841 Body Mass Index (BMI) 40.0 and over, adult: Secondary | ICD-10-CM

## 2013-12-22 DIAGNOSIS — E1140 Type 2 diabetes mellitus with diabetic neuropathy, unspecified: Secondary | ICD-10-CM

## 2013-12-22 DIAGNOSIS — F332 Major depressive disorder, recurrent severe without psychotic features: Secondary | ICD-10-CM

## 2013-12-22 NOTE — Telephone Encounter (Signed)
Pt was seeing a dietitian named Burman Nieves but she has retired. Pt would like a referral to see a new dietitian in the same office as [place]. Maggie's address was: [address] On the 4th floor  Pt aware Marion/Linda will call her back once referral is done

## 2014-01-05 ENCOUNTER — Ambulatory Visit (INDEPENDENT_AMBULATORY_CARE_PROVIDER_SITE_OTHER): Payer: Medicare PPO | Admitting: Psychology

## 2014-01-05 DIAGNOSIS — F332 Major depressive disorder, recurrent severe without psychotic features: Secondary | ICD-10-CM

## 2014-01-14 ENCOUNTER — Telehealth: Payer: Self-pay

## 2014-01-14 NOTE — Telephone Encounter (Signed)
On 01/11/14 pt had back pain with hx of kidney stones, then pt developed fever, chills and vomiting, yesterday pt started with non prod cough with clear phlegm and chest congestion, ears have started to ache today. Pt cannot drive and has no way to go to be evaluated. Pt spoke with pharmacist and was advised could be the flu and call PCP. Pt request cb. Lea. Dr Glori Bickers has not been on computer so far today.Please advise.

## 2014-01-14 NOTE — Telephone Encounter (Signed)
I'll defer to Dr. Glori Bickers but it appears she would already be out of the window for treatment with tamiflu even if she did have the flu. Home care if not SOB.   She has tessalon rx for cough, would use that in the meantime.  Rest and fluids, prn tylenol for fever.  Routed to PCP as FYI.

## 2014-01-14 NOTE — Telephone Encounter (Signed)
Agree with Dr Damita Dunnings- home care/tessalon/fluids  Watch for s/s of dehydration - UC or ER if necessary  Too late for tamiflu   Thanks

## 2014-01-14 NOTE — Telephone Encounter (Signed)
Pt notified of Dr. Tower/Dr. Josefine Class comments/recommendations and pt verbalized understanding

## 2014-01-26 LAB — HM DIABETES EYE EXAM

## 2014-01-27 ENCOUNTER — Encounter: Payer: Self-pay | Admitting: Family Medicine

## 2014-01-27 ENCOUNTER — Ambulatory Visit (INDEPENDENT_AMBULATORY_CARE_PROVIDER_SITE_OTHER): Payer: Medicare PPO | Admitting: Family Medicine

## 2014-01-27 VITALS — BP 110/76 | HR 81 | Temp 98.5°F | Ht 64.0 in | Wt 345.2 lb

## 2014-01-27 DIAGNOSIS — S8411XD Injury of peroneal nerve at lower leg level, right leg, subsequent encounter: Secondary | ICD-10-CM

## 2014-01-27 DIAGNOSIS — S8011XS Contusion of right lower leg, sequela: Secondary | ICD-10-CM

## 2014-01-27 DIAGNOSIS — IMO0001 Reserved for inherently not codable concepts without codable children: Secondary | ICD-10-CM

## 2014-01-27 NOTE — Progress Notes (Signed)
[address] [place] [address] Phone: [phone] Fax: [phone]  Patient ID: Mackenzie Bonilla MRN: [id number], DOB: 06-29-1965, 48 y.o. Date of Encounter: 01/27/2014  Primary Physician:  Roxy Manns, MD   Chief Complaint: Knee Pain   Subjective:   History of Present Illness:  Mackenzie Bonilla is a 48 y.o. very pleasant female patient who presents with the following:  On 07/18/2013, fell. All soft tissue injury. Still having altered sensation to the right knee and will get some neuropathic sensation. More tight on the right. The history is gone over in detail below.  The patient fell, it sounds as if she had a relatively large hematoma, and she subsequently has developed some neuropathic sensation around her, peroneal nerve and the fibular head.  These have not gone away completely, and she is here today to discuss them again.   09/10/2013 Last OV with Hannah Beat, MD  F/u R leg and R knee: Overall, she is doing quite a bit better, s/p rest and injection on last OV.  Slipped this morning.   Knee and leg feel much better, pins and needles will come and will bend up to her. Worse then. Sometimes out of the blue.  Sheet will bother it sometimes.   Left knee is hurting her some.    08/12/2013 Last OV with Hannah Beat, MD  Dr. Milinda Antis asked me to evaluate the patient's knee pain. This patient quite well, and I did bilateral Synvisc injections on the patient's knees in August of 2012. She had a dramatic, excellent response to these injections, and she has been basically pain-free in her knees until her recent accident last month.  DOI: Fall Saturday evening 07/18/2013  Was hurting and fell down the steps. Was walking in mother's backyard and then out of the blue, fell flat on her face. TMJ is "coming out." She describes falling and striking her knee directly on the ground, she also struck part of her face per report, but she had no sequelae or head injury. She was by  herself, and she had to take a significant amount of time to try to reposition herself so she could stand up and RIGHT herself.  Her history is significant for a BMI is 61. When I last saw the patient she did have bilateral significant moderate osteoarthritis of both knees. I reviewed her prior films as well as her 4 view non-weightbearing trauma series.  She also describes when she hit she had a large hematoma that developed on the lateral aspect of her lower extremity. Subsequent to this, the patient's had some very significant numbness and paresthesias on the lateral aspect of her lower extremity.  Past Medical History, Surgical History, Social History, Family History, Problem List, Medications, and Allergies have been reviewed and updated if relevant.  Review of Systems: GEN: no acute illness or fever CV: No chest pain or shortness of breath MSK: detailed above Neuro: neurological signs are described above O/w per HPI  Objective:   Physical Examination: BP 110/76 mmHg  Pulse 81  Temp(Src) 98.5 F (36.9 C) (Oral)  Ht 5\' 4"  (1.626 m)  Wt 345 lb 4 oz (156.604 kg)  BMI 59.23 kg/m2   GEN: WDWN, NAD, Non-toxic, Alert & Oriented x 3 HEENT: Atraumatic, Normocephalic.  Ears and Nose: No external deformity. EXTR: 1+ LE edema NEURO: antalgic gait.  PSYCH: Normally interactive. Conversant. Not depressed or anxious appearing.  Flat affect.  RIGHT knee: No to mild tenderness at tibial tubercle and  medially in the tibia. No significant tenderness in the patellar tendon. The patella NT,  The medial joint line is minimally tender. Lateral joint line is nontender  On the lateral aspect of the lower extremity the patient has decreased sensation to pinprick and soft touch just distal to the fibular head. Overall, much better compared to exam over the summer.  There is a minimal effusion. Her extension is lacking 2 and she flexes to 117. Bounce home test is negative. McMurray's test is grossly  negative. Flexion pinch test is minimally painful.  Medial collateral ligament, lateral collateral ligament, anterior cruciate ligament, and posterior cruciate ligament appear negative and intact on examination.    Radiology: Dg Knee Complete 4 Views Right  07/24/2013   CLINICAL DATA:  Right knee pain status post fall  EXAM: RIGHT KNEE - COMPLETE 4+ VIEW  COMPARISON:  Right knee series of Jul 13, 2010  FINDINGS: The study is degraded due to scatter effects from the overlying soft tissues. The bones are adequately mineralized. There is narrowing of all 3 joint compartments. There is beaking of the tibial spines. There is a lateral tibial plateau osteophyte. There is no joint effusion. Mild soft tissue swelling anteriorly is present.  IMPRESSION: There is no acute bony abnormality. There are moderate degenerative changes.   Electronically Signed   By: David  [place]   On: 07/24/2013 08:38   The radiological images were independently reviewed by myself in the office and results were reviewed with the patient. My independent interpretation of images:  Intervally, do not think there is any significant change compared to prior films in 2012. There is tricompartmental arthritis, to a moderate degree in all compartments. There is no evidence for occult fracture or dislocation. Multiple osteophytes are present. Electronically Signed  By: Hannah Beat, MD On: 08/13/2013 11:11 AM    Assessment & Plan:   Common peroneal nerve dysfunction, right, subsequent encounter  Traumatic hematoma of lower leg, right, sequela  This is a challenging case, and it is possible that she may have some symptoms of a neuropathic nature in this region of her lateral knee indefinitely.  He certainly could try some neuropathic agents, TCA or something such as gabapentin, but the patient is [name] on a well psychiatric medication, so I am very hesitant to do so.  The patient agrees with me about this.  For now we will follow this, and  I again recommended weight loss.  Signed,  Elpidio Galea. Nadav Swindell, MD, [facility] Sports [facility]  Patient's Medications  New Prescriptions   No medications on file  Previous Medications   ACETAMINOPHEN (TYLENOL) 500 MG TABLET    Take 500 mg by mouth every 6 (six) hours as needed. OTC as directed    ALBUTEROL (PROVENTIL HFA;VENTOLIN HFA) 108 (90 BASE) MCG/ACT INHALER    Inhale 2 puffs into the lungs every 4 (four) hours as needed for wheezing.   ALLOPURINOL (ZYLOPRIM) 100 MG TABLET    Take 1 tablet (100 mg total) by mouth daily.   ALPRAZOLAM (XANAX) 1 MG TABLET    Take 2 mg by mouth 3 (three) times daily as needed.    ALUMINUM CHLORIDE (DRYSOL) 20 % EXTERNAL SOLUTION    Apply topically at bedtime. Apply to affected areas at bedtime - then wash off in the AM once sweating improves - can use twice weekly    AMOXICILLIN-CLAVULANATE (AUGMENTIN) 875-125 MG PER TABLET    Take 1 tablet by mouth 2 (two) times daily.   BENZONATATE (TESSALON) 200  MG CAPSULE    TAKE ONE CAPSULE THREE TIMES A DAY AS NEEDED FOR COUGH--SWALLOW WHOLE--DO NOT CHEW   BUPROPION (WELLBUTRIN XL) 150 MG 24 HR TABLET    450 mg daily.    CALCIUM CARBONATE (TUMS - DOSED IN MG ELEMENTAL CALCIUM) 500 MG CHEWABLE TABLET    Chew 1 tablet by mouth as needed.   CHOLECALCIFEROL (VITAMIN D) 1000 UNITS CAPSULE    Take 1,000 Units by mouth daily.     CLONIDINE (CATAPRES) 0.2 MG TABLET    Take 0.2 mg by mouth at bedtime.    CYANOCOBALAMIN (,VITAMIN B-12,) 1000 MCG/ML INJECTION    Inject 1,000 mcg into the muscle once a month.   CYCLOBENZAPRINE (FLEXERIL) 5 MG TABLET    TAKE 1 TABLET 3 TIMES DAILY AS NEEDED FOR MUSCLE SPASM   DIMENHYDRINATE (DRAMAMINE) 50 MG TABLET    Take 50 mg by mouth every 8 (eight) hours as needed.   FLUOXETINE (PROZAC) 40 MG CAPSULE    Take 80 mg by mouth daily.    FLUTICASONE (FLONASE) 50 MCG/ACT NASAL SPRAY    Place 2 sprays into both nostrils daily.   FLUTICASONE-SALMETEROL (ADVAIR HFA) 115-21 MCG/ACT INHALER    Inhale 2 puffs  into the lungs 2 (two) times daily.   GLIPIZIDE (GLUCOTROL XL) 5 MG 24 HR TABLET    Take 1 tablet (5 mg total) by mouth daily with breakfast.   GLYCOPYRROLATE (ROBINUL) 1 MG TABLET    Take 2 tablets by mouth twice daily as needed for abdominal cramping   GUAIFENESIN (REFENESEN 400) 400 MG TABS    Take 400 mg by mouth every 4 (four) hours as needed. OTC as directed   KETOCONAZOLE (NIZORAL) 2 % CREAM    Apply topically daily. Apply to affected area once daily for 2 weeks    MOMETASONE (ELOCON) 0.1 % CREAM    Apply 1 application topically daily. Apply to affected area once daily   NAPHAZOLINE-GLYCERIN (CLEAR EYES) 0.012-0.2 % SOLN    Place 1-2 drops into both eyes every 4 (four) hours as needed. OTC as directed    NORETHINDRONE-ETHINYL ESTRADIOL (GILDESS 1/20) 1-20 MG-MCG TABLET    Take 1 tablet by mouth daily.   NYSTATIN (NYSTOP) 100000 UNIT/GM POWD    Apply topically 2 (two) times daily as needed. Apply to affected area two times a day until clear asneeded    OMEPRAZOLE (PRILOSEC) 20 MG CAPSULE    Take 1 capsule (20 mg total) by mouth 2 (two) times daily.   OXYBUTYNIN (DITROPAN XL) 15 MG 24 HR TABLET    Take 1 tablet (15 mg total) by mouth daily.   PAROXETINE MESYLATE (BRISDELLE) 7.5 MG CAPS    Take 7.5 mg by mouth daily.   TALC (ZEASORB) POWDER    Apply topically as needed.   TRAMADOL (ULTRAM) 50 MG TABLET    Take 1 tablet (50 mg total) by mouth every 8 (eight) hours as needed.   TRAZODONE (DESYREL) 50 MG TABLET    Take 6 tablets  By mouth at bedtime. But can take up to 9 tablets daily   Modified Medications   No medications on file  Discontinued Medications   No medications on file

## 2014-01-27 NOTE — Progress Notes (Signed)
Pre visit review using our clinic review tool, if applicable. No additional management support is needed unless otherwise documented below in the visit note. 

## 2014-01-28 ENCOUNTER — Encounter: Payer: Self-pay | Admitting: *Deleted

## 2014-01-28 ENCOUNTER — Encounter: Payer: Medicare PPO | Attending: Family Medicine | Admitting: *Deleted

## 2014-01-28 VITALS — Ht 64.0 in | Wt 345.5 lb

## 2014-01-28 DIAGNOSIS — E1140 Type 2 diabetes mellitus with diabetic neuropathy, unspecified: Secondary | ICD-10-CM | POA: Diagnosis present

## 2014-01-28 DIAGNOSIS — Z713 Dietary counseling and surveillance: Secondary | ICD-10-CM | POA: Insufficient documentation

## 2014-01-28 DIAGNOSIS — Z6841 Body Mass Index (BMI) 40.0 and over, adult: Secondary | ICD-10-CM | POA: Diagnosis not present

## 2014-01-28 NOTE — Patient Instructions (Signed)
Plan:  Aim for 3 Carb Choices per meal (45 grams) +/- 1 either way  Aim for 0-1 Carbs per snack if hungry  Include protein in moderation with your meals and snacks Consider reading food labels for Total Carbohydrate of foods We will discuss ways to increase your activity level at our next visit.

## 2014-01-28 NOTE — Progress Notes (Signed)
  Medical Nutrition Therapy:  Appt start time: 1400 end time:  0947.  Assessment:  Primary concerns today: 01/28/14.Marland Kitchen Patient has worked with Burman Nieves May, RD, RN, CDE in this office before, but she has retired so I will be seeing her now.  She has had DM 2 for a couple of years. She states she suffers with depression and bipolar disorder along with anxiety attacks and claustrophobia. She struggles with being in crowds such as shopping. She states she has fibromyalgia and PCOS. She has a meter but has not been testing lately. She states she is new on Glipizide and her BG's have improved some. She has a meter but has not checked since having teh flu and being too sick. Not physically active. 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 Doesn't like water.   Preferred Learning Style:  Visual  Hands on   Learning Readiness:   Contemplating  Ready  Change in progress  MEDICATIONS: see list, diabetes medication is Glipizide XL   DIETARY INTAKE:  24-hr recall:  B ( AM): has problem with food intake in the AM, and she sleeps late  Snk ( AM): no  L ( PM): depends on what is in the house. May go to [employer] and get a burger, drink such as sweet tea and fries or other food (she thinks in 3's) Snk ( PM): grazes throughout the day D ( PM): She may cook 2-3 nights a week. Then she may not cook for a week or so. She cooks for 4 servings so she can save for a later day. Snk ( PM): no Beverages: soda, water  Usual physical activity: none at this time. She would prefer inside versus outside activities, she is not at all interested in water activities  Estimated energy needs: 1600 calories 180 g carbohydrates 120 g protein 44 g fat  Progress Towards Goal(s):  In progress.   Nutritional Diagnosis:  NI-1.5 Excessive energy intake As related to activity level.  As evidenced by BMI of 59.4.    Intervention:   Nutrition counseling and diabetes education initiated. Discussed Carb Counting by food group as method of portion control, reading food labels, and benefits of increased activity. We concentrated on reading food labels and recipes to identify carb content and how to integrate into her meal plan. Discussed that sugar is a type of carbohydrate and is included in the total on the food label. Provided ideas for her Grocery List per her request. She is not ready to discuss activity today.  Teaching Method Utilized:  Visual Auditory Hands on  Handouts given during visit include: Carb Counting and Food Label handouts Meal Plan Card  Barriers to learning/adherence to lifestyle change: multiple mental health and physical issues  Demonstrated degree of understanding via:  Teach Back   Monitoring/Evaluation:  Dietary intake, exercise, reading food labels, and body weight in 6 week(s).

## 2014-01-29 ENCOUNTER — Other Ambulatory Visit: Payer: Medicare PPO

## 2014-02-02 ENCOUNTER — Ambulatory Visit: Payer: Medicare PPO | Admitting: Psychology

## 2014-02-12 DIAGNOSIS — W19XXXA Unspecified fall, initial encounter: Secondary | ICD-10-CM

## 2014-02-12 HISTORY — PX: BREAST BIOPSY: SHX20

## 2014-02-12 HISTORY — DX: Unspecified fall, initial encounter: W19.XXXA

## 2014-02-19 ENCOUNTER — Telehealth: Payer: Self-pay | Admitting: Family Medicine

## 2014-02-19 NOTE — Telephone Encounter (Signed)
Schedule appt next week please  Make sure to drink lots of water

## 2014-02-19 NOTE — Telephone Encounter (Signed)
°  Patient Name: Mackenzie Bonilla  DOB: 03-04-1965    Nurse Assessment  Nurse: Donalynn Furlong, RN, Myna Hidalgo Date/Time Eilene Ghazi Time): 02/19/2014 12:13:05 PM  Confirm and document reason for call. If symptomatic, describe symptoms. ---Caller states is having some vaginal itching, cloudy urine, lower back ache for over a week . Has colon spasms and back spasms.Has been taking Oxybutinin 15 mg once a day for over a year. Also has quit taking the medicine for 2 weeks, and started back on the medicine 1 hour ago  Has the patient traveled out of the country within the last 30 days? ---No  Does the patient require triage? ---Yes  Related visit to physician within the last 2 weeks? ---No  Does the PT have any chronic conditions? (i.e. diabetes, asthma, etc.) ---Yes  List chronic conditions. ---diabetes, depression, Hx bladder and colon spasms  Did the patient indicate they were pregnant? ---No     Guidelines    Guideline Title Affirmed Question Affirmed Notes  Vaginal Discharge Symptoms of a vaginal yeast infection (i.e., white, thick, cottage-cheese-like, itchy, not bad smelling discharge)   Urinary Symptoms All other urine symptoms    Final Disposition User   See PCP within [place], RN, Myna Hidalgo    Comments  cbwn - missed the nurses call  pt states the slight cloudiness in urine may be from the "itchiness in my vagina", no urinary burning, urgency. Also started drinking cranberry juice(low sugar) yesterday. Will not wear tight jeans etc.

## 2014-02-19 NOTE — Telephone Encounter (Signed)
Pt advise Dr. Glori Bickers wants to see her next week. Pt declined to schedule an appt., she is going to see how she feels and call us back on Monday to schedule appt if not improved

## 2014-03-01 ENCOUNTER — Other Ambulatory Visit: Payer: Self-pay | Admitting: Family Medicine

## 2014-03-02 ENCOUNTER — Encounter: Payer: Self-pay | Admitting: Family Medicine

## 2014-03-02 ENCOUNTER — Ambulatory Visit (INDEPENDENT_AMBULATORY_CARE_PROVIDER_SITE_OTHER): Payer: Medicare PPO | Admitting: Family Medicine

## 2014-03-02 VITALS — BP 112/72 | HR 96 | Temp 98.2°F | Ht 64.0 in | Wt 331.8 lb

## 2014-03-02 DIAGNOSIS — N3000 Acute cystitis without hematuria: Secondary | ICD-10-CM

## 2014-03-02 DIAGNOSIS — R350 Frequency of micturition: Secondary | ICD-10-CM

## 2014-03-02 DIAGNOSIS — N390 Urinary tract infection, site not specified: Secondary | ICD-10-CM | POA: Insufficient documentation

## 2014-03-02 LAB — POCT URINALYSIS DIPSTICK
Bilirubin, UA: NEGATIVE
NITRITE UA: NEGATIVE
Urobilinogen, UA: 0.2
pH, UA: 5.5

## 2014-03-02 MED ORDER — FLUCONAZOLE 150 MG PO TABS: 150.0000 mg | ORAL_TABLET | Freq: Once | ORAL | Status: DC

## 2014-03-02 MED ORDER — CIPROFLOXACIN HCL 250 MG PO TABS: 250.0000 mg | ORAL_TABLET | Freq: Two times a day (BID) | ORAL | Status: DC

## 2014-03-02 MED ORDER — SPIRONOLACTONE 50 MG PO TABS: 50.0000 mg | ORAL_TABLET | Freq: Every day | ORAL | Status: DC

## 2014-03-02 NOTE — Progress Notes (Signed)
Pre visit review using our clinic review tool, if applicable. No additional management support is needed unless otherwise documented below in the visit note. 

## 2014-03-02 NOTE — Patient Instructions (Addendum)
I think you have a urinary tract infection  We will send your urine for a culture and get back to you with result Take cipro as directed  Take diflucan for yeast infection after done with cipro   Drink lots of water - increase by at least 2 more bottles per day   Update if not starting to improve in several days  or if worsening

## 2014-03-02 NOTE — Progress Notes (Signed)
Subjective:    Patient ID: Mackenzie Bonilla, female    DOB: [birth year], 49 y.o.   MRN: [id number]  HPI Here for urinary issues and poss yeast infection   Urine cloudy  A little bit of vaginal itch/odor - unsure if scant white d/c  Low back pain also - went to chiropractor -helpful  Some flank discomfort  No blood in urine  No burning to urinate  Some frequency - hard to say if more than usual  Some urgency  Some cramping in low abdomen/ bladder area - no vaginal bleeding     Results for orders placed or performed in visit on 03/02/14  POCT urinalysis dipstick  Result Value Ref Range   Color, UA Yellow    Clarity, UA Cloudy    Glucose, UA 100+    Bilirubin, UA neg.    Ketones, UA 5+    Spec Grav, UA >=1.030    Blood, UA moderate    pH, UA 5.5    Protein, UA 15+    Urobilinogen, UA 0.2    Nitrite, UA neg.    Leukocytes, UA large (3+)     She did start drinking diet cranberry juice   Has been seeing the nutritionist and doing better -it helped a lot! Scheduled for another visit  Wt is down 14 lb  Working hard to [employer] better / has DM cook books and got a slow cooker    Lab Results  Component Value Date   HGBA1C 7.6* 12/02/2013   Lab Results  Component Value Date   CREATININE 1.0 12/02/2013   BUN 9 12/02/2013   NA 139 12/02/2013   K 4.5 12/02/2013   CL 102 12/02/2013   CO2 29 12/02/2013   Patient Active Problem List   Diagnosis Date Noted  . Right otitis media 10/21/2013  . Ingrown toenail 08/11/2013  . Right knee pain 07/23/2013  . Fall 07/23/2013  . Insect bite 06/05/2013  . Great toe pain 05/12/2013  . Medication management 11/20/2012  . Abdominal pain, unspecified site 04/23/2012  . Uric acid kidney stone 11/20/2011  . HYPERHIDROSIS 11/07/2009  . HOT FLASHES 06/17/2009  . Type 2 diabetes, controlled, with neuropathy 08/23/2008  . VITAMIN D DEFICIENCY 05/07/2008  . VITAMIN B12 DEFICIENCY 06/04/2007  . CHRONIC FATIGUE SYNDROME 11/15/2006  .  DIVERTICULITIS, HX OF 11/15/2006  . POLYCYSTIC OVARIAN DISEASE 10/16/2006  . Hyperlipidemia LDL goal <100 10/16/2006  . Morbid obesity with body mass index of 60.0-69.9 in adult 10/16/2006  . ANXIETY 10/16/2006  . PANIC DISORDER 10/16/2006  . BULIMIA 10/16/2006  . DEPRESSION 10/16/2006  . CARPAL TUNNEL SYNDROME 10/16/2006  . LYMPHEDEMA 10/16/2006  . ALLERGIC RHINITIS 10/16/2006  . ASTHMA, MILD, INTERMITTENT 10/16/2006  . TMJ SYNDROME 10/16/2006  . GERD 10/16/2006  . IRRITABLE BOWEL SYNDROME 10/16/2006  . OSTEOARTHRITIS 10/16/2006  . FIBROMYALGIA 10/16/2006  . COUGH, CHRONIC 10/16/2006   Past Medical History  Diagnosis Date  . Allergy     allergic rhinitis  . Anxiety   . Depression   . Hyperlipidemia   . Arthritis     osteoarthritis  . Obesity   . Lymphedema   . Tachycardia   . Neuromuscular disorder     fibromyalgia  . Hyperhidrosis   . GERD (gastroesophageal reflux disease)     EGD negative 06/2001// EGD erythematous mucosa, polyp 08/2008  . Fatty liver 07/2008    abd. ultrasound - fatty liver ; slt dilated cbd (no stones ) 06/10// abd.  ultrasound normal  on 04/2006  . Diabetes mellitus without complication    Past Surgical History  Procedure Laterality Date  . Tonsillectomy    . Septoplasty    . Foot surgery    . Uterine tumor  09/2001  . Vagus nerve stimulator insertion    . Endometrial biopsy  01/2004  . Uterine fibroid surgery  06/2005    ablation   History  Substance Use Topics  . Smoking status: Never Smoker   . Smokeless tobacco: Never Used  . Alcohol Use: No   Family History  Problem Relation Age of Onset  . Hypertension Father   . Cancer Father     pancreatic cancer  . Hypertension Sister   . Heart disease Maternal Grandmother 60    MI  . Heart disease Paternal Grandmother 75    MI   Allergies  Allergen Reactions  . Cephalexin   . Codeine     REACTION: ? reaction  . Duloxetine   . Hydrocodone     REACTION: ? reaction  . Levaquin  [Levofloxacin] Nausea Only  . Metformin And Related Diarrhea    GI side effects   . Norelgestromin-Eth Estradiol   . Prednisone     Tolerates intraarticular depo-medrol.  . Quetiapine   . Sertraline Hcl     REACTION: vivid dreams  . Triazolam     REACTION: ? reaction  . Zaleplon   . Zocor [Simvastatin - High Dose]     achey  . Zolpidem Tartrate     REACTION: ? reaction   Current Outpatient Prescriptions on File Prior to Visit  Medication Sig Dispense Refill  . acetaminophen (TYLENOL) 500 MG tablet Take 500 mg by mouth every 6 (six) hours as needed. OTC as directed     . albuterol (PROVENTIL HFA;VENTOLIN HFA) 108 (90 BASE) MCG/ACT inhaler Inhale 2 puffs into the lungs every 4 (four) hours as needed for wheezing.    Marland Kitchen allopurinol (ZYLOPRIM) 100 MG tablet Take 1 tablet (100 mg total) by mouth daily. 30 tablet 11  . ALPRAZolam (XANAX) 1 MG tablet Take 2 mg by mouth 3 (three) times daily as needed.     Marland Kitchen aluminum chloride (DRYSOL) 20 % external solution Apply topically at bedtime. Apply to affected areas at bedtime - then wash off in the AM once sweating improves - can use twice weekly     . amoxicillin-clavulanate (AUGMENTIN) 875-125 MG per tablet Take 1 tablet by mouth 2 (two) times daily. 20 tablet 0  . benzonatate (TESSALON) 200 MG capsule TAKE ONE CAPSULE THREE TIMES A DAY AS NEEDED FOR COUGH--SWALLOW WHOLE--DO NOT CHEW 90 capsule 3  . buPROPion (WELLBUTRIN XL) 150 MG 24 hr tablet 450 mg daily.     . calcium carbonate (TUMS - DOSED IN MG ELEMENTAL CALCIUM) 500 MG chewable tablet Chew 1 tablet by mouth as needed.    . Cholecalciferol (VITAMIN D) 1000 UNITS capsule Take 1,000 Units by mouth daily.      . cloNIDine (CATAPRES) 0.2 MG tablet Take 0.2 mg by mouth at bedtime.     . cyanocobalamin (,VITAMIN B-12,) 1000 MCG/ML injection Inject 1,000 mcg into the muscle once a month. 1 mL 5  . cyclobenzaprine (FLEXERIL) 5 MG tablet TAKE 1 TABLET 3 TIMES DAILY AS NEEDED FOR MUSCLE SPASM 30 tablet  1  . dimenhyDRINATE (DRAMAMINE) 50 MG tablet Take 50 mg by mouth every 8 (eight) hours as needed.    Marland Kitchen FLUoxetine (PROZAC) 40 MG capsule Take 80 mg by mouth daily.     Marland Kitchen  fluticasone (FLONASE) 50 MCG/ACT nasal spray Place 2 sprays into both nostrils daily. 16 g 6  . fluticasone-salmeterol (ADVAIR HFA) 115-21 MCG/ACT inhaler Inhale 2 puffs into the lungs 2 (two) times daily.    Marland Kitchen GLIPIZIDE XL 5 MG 24 hr tablet TAKE 1 TABLET ONCE DAILY WITH BREAKFAST 30 tablet 0  . glycopyrrolate (ROBINUL) 1 MG tablet Take 2 tablets by mouth twice daily as needed for abdominal cramping 120 tablet 5  . guaifenesin (REFENESEN 400) 400 MG TABS Take 400 mg by mouth every 4 (four) hours as needed. OTC as directed    . ketoconazole (NIZORAL) 2 % cream Apply topically daily. Apply to affected area once daily for 2 weeks     . mometasone (ELOCON) 0.1 % cream Apply 1 application topically daily. Apply to affected area once daily 15 g 0  . naphazoline-glycerin (CLEAR EYES) 0.012-0.2 % SOLN Place 1-2 drops into both eyes every 4 (four) hours as needed. OTC as directed     . norethindrone-ethinyl estradiol (GILDESS 1/20) 1-20 MG-MCG tablet Take 1 tablet by mouth daily.    Marland Kitchen nystatin (NYSTOP) 100000 UNIT/GM POWD Apply topically 2 (two) times daily as needed. Apply to affected area two times a day until clear asneeded     . omeprazole (PRILOSEC) 20 MG capsule Take 1 capsule (20 mg total) by mouth 2 (two) times daily. 60 capsule 5  . oxybutynin (DITROPAN XL) 15 MG 24 hr tablet Take 1 tablet (15 mg total) by mouth daily. 30 tablet 11  . PARoxetine Mesylate (BRISDELLE) 7.5 MG CAPS Take 7.5 mg by mouth daily.    Marland Kitchen talc (ZEASORB) powder Apply topically as needed. 240 g 3  . traMADol (ULTRAM) 50 MG tablet Take 1 tablet (50 mg total) by mouth every 8 (eight) hours as needed. 60 tablet 3  . traZODone (DESYREL) 50 MG tablet Take 6 tablets  By mouth at bedtime. But can take up to 9 tablets daily      No current facility-administered  medications on file prior to visit.      Review of Systems Review of Systems  Constitutional: Negative for fever, appetite change, fatigue and unexpected weight change.  Eyes: Negative for pain and visual disturbance.  Respiratory: Negative for cough and shortness of breath.   Cardiovascular: Negative for cp or palpitations    Gastrointestinal: Negative for nausea, diarrhea and constipation.  Genitourinary: pos  for urgency and frequency. neg for hematuria or flank pain  Skin: Negative for pallor or rash   Neurological: Negative for weakness, light-headedness, numbness and headaches.  Hematological: Negative for adenopathy. Does not bruise/bleed easily.  Psychiatric/Behavioral: Negative for dysphoric mood. The patient is not nervous/anxious.         Objective:   Physical Exam  Constitutional: She appears well-developed and well-nourished.  Morbidly obese and well appearing   HENT:  Head: Normocephalic and atraumatic.  Eyes: Conjunctivae and EOM are normal.  Neck: Normal range of motion. Neck supple.  Cardiovascular: Normal rate and regular rhythm.   Pulmonary/Chest: Effort normal and breath sounds normal.  Abdominal: Soft. Bowel sounds are normal. She exhibits no distension. There is tenderness. There is no rebound and no guarding.  Mild suprapubic tenderness without rebound or guarding   Neurological: She is alert.  Skin: Skin is warm and dry. No rash noted. No erythema. No pallor.  Psychiatric: She has a normal mood and affect.          Assessment & Plan:   Problem List Items Addressed  This Visit      Genitourinary   UTI (urinary tract infection) - Primary    Cover with cipro  Enc fluids  Pend urine culture      Relevant Medications   fluconazole (DIFLUCAN) tablet 150 mg   Other Relevant Orders   Urine culture (Completed)    Other Visit Diagnoses    Urinary frequency        Relevant Orders    POCT urinalysis dipstick (Completed)

## 2014-03-05 LAB — URINE CULTURE: Colony Count: >=100000

## 2014-03-05 NOTE — Assessment & Plan Note (Signed)
Cover with cipro  Enc fluids  Pend urine culture

## 2014-03-08 ENCOUNTER — Encounter: Payer: Self-pay | Admitting: *Deleted

## 2014-03-09 ENCOUNTER — Ambulatory Visit (INDEPENDENT_AMBULATORY_CARE_PROVIDER_SITE_OTHER): Payer: Medicare PPO | Admitting: Podiatry

## 2014-03-09 DIAGNOSIS — M79676 Pain in unspecified toe(s): Secondary | ICD-10-CM

## 2014-03-09 DIAGNOSIS — B351 Tinea unguium: Secondary | ICD-10-CM

## 2014-03-09 NOTE — Progress Notes (Signed)
Subjective:     Patient ID: Mackenzie Bonilla, female   DOB: [birth year], 49 y.o.   MRN: [id number]  HPI patient presents with incurvated painful nailbeds 1-5 both feet that she cannot reach or cut   Review of Systems     Objective:   Physical Exam Neurovascular status unchanged with thick incurvated nail bed 1-5 both feet that are painful    Assessment:     Mycotic nail infection is with pain 1-5 both feet    Plan:     Debris painful nailbeds 1-5 both feet with no iatrogenic bleeding noted

## 2014-03-10 NOTE — Progress Notes (Signed)
Subjective:     Patient ID: Mackenzie Bonilla, female   DOB: [birth year], 49 y.o.   MRN: [id number]  HPI obese female who is had long-term history of diabetes who has swelling of her feet structural deformity and nail disease with thickness 1-5 both feet that are painful and she cannot reach or cut   Review of Systems     Objective:   Physical Exam Neurovascular status is intact which is quite a bit of swelling of her feet and structural digital deformities along with nail disease with thickness 1-5 both feet that become painful with pressure    Assessment:     Obese female with structural deformities and edema along with nail disease and mycosis with pain 1-5 both feet    Plan:     Debrided nailbeds 1-5 both feet with no iatrogenic bleeding and discussed long-term diabetic shoes that she states will help with the discomfort she experiences in her feet and the swelling which I do believe would be of an advantage to her

## 2014-03-16 ENCOUNTER — Other Ambulatory Visit: Payer: Medicare PPO

## 2014-03-23 ENCOUNTER — Encounter: Payer: Self-pay | Admitting: Podiatry

## 2014-03-23 ENCOUNTER — Ambulatory Visit: Payer: Medicare PPO | Admitting: Family Medicine

## 2014-03-23 ENCOUNTER — Ambulatory Visit (INDEPENDENT_AMBULATORY_CARE_PROVIDER_SITE_OTHER): Payer: Medicare PPO | Admitting: Podiatry

## 2014-03-23 DIAGNOSIS — M2040 Other hammer toe(s) (acquired), unspecified foot: Secondary | ICD-10-CM

## 2014-03-23 DIAGNOSIS — E1151 Type 2 diabetes mellitus with diabetic peripheral angiopathy without gangrene: Secondary | ICD-10-CM

## 2014-03-23 DIAGNOSIS — R609 Edema, unspecified: Secondary | ICD-10-CM

## 2014-03-23 NOTE — Progress Notes (Signed)
Measured for diabetic shoes and insoles. 

## 2014-03-23 NOTE — Progress Notes (Signed)
Subjective:     Patient ID: Mackenzie Bonilla, female   DOB: 05-Feb-1966, 49 y.o.   MRN: [id number]  HPI patient is long-term diabetic with obesity and swelling feet along with digital deformities noted and dry skin.   Review of Systems     Objective:   Physical Exam Neurovascular status diminished with swelling of both feet digital deformities and hyper dry type skin secondary to above conditions with poor health also    Assessment:     At risk diabetic with multiple health issues    Plan:     Patient's placed and casted for diabetic shoes which we will get back to her as soon as possible

## 2014-03-25 ENCOUNTER — Ambulatory Visit: Payer: Medicare PPO | Admitting: *Deleted

## 2014-03-31 ENCOUNTER — Other Ambulatory Visit (INDEPENDENT_AMBULATORY_CARE_PROVIDER_SITE_OTHER): Payer: Medicare PPO

## 2014-03-31 ENCOUNTER — Ambulatory Visit (INDEPENDENT_AMBULATORY_CARE_PROVIDER_SITE_OTHER): Payer: Medicare PPO | Admitting: Psychology

## 2014-03-31 DIAGNOSIS — E1140 Type 2 diabetes mellitus with diabetic neuropathy, unspecified: Secondary | ICD-10-CM

## 2014-03-31 DIAGNOSIS — E785 Hyperlipidemia, unspecified: Secondary | ICD-10-CM

## 2014-03-31 DIAGNOSIS — F332 Major depressive disorder, recurrent severe without psychotic features: Secondary | ICD-10-CM

## 2014-03-31 LAB — LIPID PANEL
CHOL/HDL RATIO: 5
CHOLESTEROL: 203 mg/dL — AB (ref 0–200)
HDL: 38.1 mg/dL — AB (ref >39.00)

## 2014-03-31 LAB — HEMOGLOBIN A1C: Hgb A1c MFr Bld: 8.9 % — ABNORMAL HIGH (ref 4.6–6.5)

## 2014-03-31 LAB — LDL CHOLESTEROL, DIRECT: Direct LDL: 90 mg/dL

## 2014-04-07 ENCOUNTER — Ambulatory Visit: Payer: Medicare PPO | Admitting: Family Medicine

## 2014-04-13 ENCOUNTER — Encounter: Payer: Self-pay | Admitting: Family Medicine

## 2014-04-13 ENCOUNTER — Ambulatory Visit (INDEPENDENT_AMBULATORY_CARE_PROVIDER_SITE_OTHER): Payer: Medicare PPO | Admitting: Family Medicine

## 2014-04-13 ENCOUNTER — Ambulatory Visit: Payer: Medicare PPO | Admitting: Psychology

## 2014-04-13 VITALS — BP 114/72 | HR 103 | Temp 98.4°F | Ht 64.0 in | Wt 338.8 lb

## 2014-04-13 DIAGNOSIS — E785 Hyperlipidemia, unspecified: Secondary | ICD-10-CM

## 2014-04-13 DIAGNOSIS — Z6841 Body Mass Index (BMI) 40.0 and over, adult: Secondary | ICD-10-CM

## 2014-04-13 DIAGNOSIS — E1140 Type 2 diabetes mellitus with diabetic neuropathy, unspecified: Secondary | ICD-10-CM

## 2014-04-13 DIAGNOSIS — E559 Vitamin D deficiency, unspecified: Secondary | ICD-10-CM

## 2014-04-13 MED ORDER — CYANOCOBALAMIN 1000 MCG/ML IJ SOLN
1000.0000 ug | Freq: Once | INTRAMUSCULAR | Status: AC
  Administered 2014-04-13: 1000 ug via INTRAMUSCULAR

## 2014-04-13 MED ORDER — FENOFIBRATE 48 MG PO TABS: 48.0000 mg | ORAL_TABLET | Freq: Every day | ORAL | Status: DC

## 2014-04-13 NOTE — Progress Notes (Signed)
Subjective:    Patient ID: Mackenzie Bonilla, female    DOB: 08/04/1965, 49 y.o.   MRN: [id number]  HPI Here for f/u of hyperlipidemia and DM2  Has been feeling very tired - still sleeping late  More difficult to breathe with wt gain  Bloated in abdomen - struggling with IBS and constipation  She cannot get stomach to feel "empty" or right   Diet is not good  Nutritionist cancelled last appt - last appt will be Thursday (she is retiring)  Last visit was really helpful  Uses a diabetic cookbook and freezing portions - (uses crock pot)  Eats too much  Needs more green vegetables   Depression is bad (she sees Dr Toy Care soon)  Her motivation is gone She sometimes does not eat in the am and will not take her medicine    Wt is up 7 lb bmi 56 Morbid obesity  Hyperlipidemia Intol of statin (zocor in the past) Lab Results  Component Value Date   CHOL 203* 03/31/2014   CHOL 221* 12/02/2013   CHOL 188 04/22/2012   Lab Results  Component Value Date   HDL 38.10* 03/31/2014   HDL 40.20 12/02/2013   HDL 37.90* 04/22/2012   Lab Results  Component Value Date   LDLCALC 44 06/17/2009   [place] 94 08/23/2008   [place] 68 05/17/2008   Lab Results  Component Value Date   TRIG * 03/31/2014    526.0 Triglyceride is over 400; calculations on Lipids are invalid.   TRIG 392.0* 12/02/2013   TRIG 286.0* 04/22/2012   Lab Results  Component Value Date   CHOLHDL 5 03/31/2014   CHOLHDL 5 12/02/2013   CHOLHDL 5 04/22/2012   Lab Results  Component Value Date   LDLDIRECT 90.0 03/31/2014   LDLDIRECT 124.3 12/02/2013   LDLDIRECT 106.1 04/22/2012    Diabetes Home sugar results -have gone up  DM diet -not great  Exercise she started walking -(she will get her new diabetic shoes next week)  Symptoms- gastroparesis symptoms  A1C last  Lab Results  Component Value Date   HGBA1C 8.9* 03/31/2014   This is up from 7.6 Is missing doses of her glipizide because she does not eat      Patient Active Problem List   Diagnosis Date Noted  . UTI (urinary tract infection) 03/02/2014  . Right otitis media 10/21/2013  . Ingrown toenail 08/11/2013  . Right knee pain 07/23/2013  . Fall 07/23/2013  . Insect bite 06/05/2013  . Great toe pain 05/12/2013  . Medication management 11/20/2012  . Abdominal pain, unspecified site 04/23/2012  . Uric acid kidney stone 11/20/2011  . HYPERHIDROSIS 11/07/2009  . HOT FLASHES 06/17/2009  . Type 2 diabetes, controlled, with neuropathy 08/23/2008  . VITAMIN D DEFICIENCY 05/07/2008  . VITAMIN B12 DEFICIENCY 06/04/2007  . CHRONIC FATIGUE SYNDROME 11/15/2006  . DIVERTICULITIS, HX OF 11/15/2006  . POLYCYSTIC OVARIAN DISEASE 10/16/2006  . Hyperlipidemia LDL goal <100 10/16/2006  . Morbid obesity with body mass index of 60.0-69.9 in adult 10/16/2006  . ANXIETY 10/16/2006  . PANIC DISORDER 10/16/2006  . BULIMIA 10/16/2006  . DEPRESSION 10/16/2006  . CARPAL TUNNEL SYNDROME 10/16/2006  . LYMPHEDEMA 10/16/2006  . ALLERGIC RHINITIS 10/16/2006  . ASTHMA, MILD, INTERMITTENT 10/16/2006  . TMJ SYNDROME 10/16/2006  . GERD 10/16/2006  . IRRITABLE BOWEL SYNDROME 10/16/2006  . OSTEOARTHRITIS 10/16/2006  . FIBROMYALGIA 10/16/2006  . COUGH, CHRONIC 10/16/2006   Past Medical History  Diagnosis Date  . Allergy  allergic rhinitis  . Anxiety   . Depression   . Hyperlipidemia   . Arthritis     osteoarthritis  . Obesity   . Lymphedema   . Tachycardia   . Neuromuscular disorder     fibromyalgia  . Hyperhidrosis   . GERD (gastroesophageal reflux disease)     EGD negative 06/2001// EGD erythematous mucosa, polyp 08/2008  . Fatty liver 07/2008    abd. ultrasound - fatty liver ; slt dilated cbd (no stones ) 06/10// abd.  ultrasound normal on 04/2006  . Diabetes mellitus without complication    Past Surgical History  Procedure Laterality Date  . Tonsillectomy    . Septoplasty    . Foot surgery    . Uterine tumor  09/2001  . Vagus nerve  stimulator insertion    . Endometrial biopsy  01/2004  . Uterine fibroid surgery  06/2005    ablation   History  Substance Use Topics  . Smoking status: Never Smoker   . Smokeless tobacco: Never Used  . Alcohol Use: No   Family History  Problem Relation Age of Onset  . Hypertension Father   . Cancer Father     pancreatic cancer  . Hypertension Sister   . Heart disease Maternal Grandmother 60    MI  . Heart disease Paternal Grandmother 60    MI   Allergies  Allergen Reactions  . Cephalexin   . Codeine     REACTION: ? reaction  . Duloxetine   . Hydrocodone     REACTION: ? reaction  . Levaquin [Levofloxacin] Nausea Only  . Metformin And Related Diarrhea    GI side effects   . Norelgestromin-Eth Estradiol   . Prednisone     Tolerates intraarticular depo-medrol.  . Quetiapine   . Sertraline Hcl     REACTION: vivid dreams  . Triazolam     REACTION: ? reaction  . Zaleplon   . Zocor [Simvastatin - High Dose]     achey  . Zolpidem Tartrate     REACTION: ? reaction   Current Outpatient Prescriptions on File Prior to Visit  Medication Sig Dispense Refill  . acetaminophen (TYLENOL) 500 MG tablet Take 500 mg by mouth every 6 (six) hours as needed. OTC as directed     . albuterol (PROVENTIL HFA;VENTOLIN HFA) 108 (90 BASE) MCG/ACT inhaler Inhale 2 puffs into the lungs every 4 (four) hours as needed for wheezing.    Marland Kitchen allopurinol (ZYLOPRIM) 100 MG tablet Take 1 tablet (100 mg total) by mouth daily. 30 tablet 11  . ALPRAZolam (XANAX) 1 MG tablet Take 2 mg by mouth 3 (three) times daily as needed.     Marland Kitchen aluminum chloride (DRYSOL) 20 % external solution Apply topically at bedtime. Apply to affected areas at bedtime - then wash off in the AM once sweating improves - can use twice weekly     . benzonatate (TESSALON) 200 MG capsule TAKE ONE CAPSULE THREE TIMES A DAY AS NEEDED FOR COUGH--SWALLOW WHOLE--DO NOT CHEW 90 capsule 3  . buPROPion (WELLBUTRIN XL) 150 MG 24 hr tablet 450 mg  daily.     . calcium carbonate (TUMS - DOSED IN MG ELEMENTAL CALCIUM) 500 MG chewable tablet Chew 1 tablet by mouth as needed.    . Cholecalciferol (VITAMIN D) 1000 UNITS capsule Take 1,000 Units by mouth daily.      . ciprofloxacin (CIPRO) 250 MG tablet Take 1 tablet (250 mg total) by mouth 2 (two) times daily. 10 tablet  0  . cloNIDine (CATAPRES) 0.2 MG tablet Take 0.2 mg by mouth at bedtime.     . cyanocobalamin (,VITAMIN B-12,) 1000 MCG/ML injection Inject 1,000 mcg into the muscle once a month. 1 mL 5  . cyclobenzaprine (FLEXERIL) 5 MG tablet TAKE 1 TABLET 3 TIMES DAILY AS NEEDED FOR MUSCLE SPASM 30 tablet 1  . dimenhyDRINATE (DRAMAMINE) 50 MG tablet Take 50 mg by mouth every 8 (eight) hours as needed.    . fluconazole (DIFLUCAN) 150 MG tablet Take 1 tablet (150 mg total) by mouth once. Take it when done with your antibiotic 1 tablet 0  . FLUoxetine (PROZAC) 40 MG capsule Take 80 mg by mouth daily.     . fluticasone (FLONASE) 50 MCG/ACT nasal spray Place 2 sprays into both nostrils daily. 16 g 6  . fluticasone-salmeterol (ADVAIR HFA) 115-21 MCG/ACT inhaler Inhale 2 puffs into the lungs 2 (two) times daily.    Marland Kitchen GLIPIZIDE XL 5 MG 24 hr tablet TAKE 1 TABLET ONCE DAILY WITH BREAKFAST 30 tablet 0  . glycopyrrolate (ROBINUL) 1 MG tablet Take 2 tablets by mouth twice daily as needed for abdominal cramping 120 tablet 5  . guaifenesin (REFENESEN 400) 400 MG TABS Take 400 mg by mouth every 4 (four) hours as needed. OTC as directed    . ketoconazole (NIZORAL) 2 % cream Apply topically daily. Apply to affected area once daily for 2 weeks     . mometasone (ELOCON) 0.1 % cream Apply 1 application topically daily. Apply to affected area once daily 15 g 0  . naphazoline-glycerin (CLEAR EYES) 0.012-0.2 % SOLN Place 1-2 drops into both eyes every 4 (four) hours as needed. OTC as directed     . norethindrone-ethinyl estradiol (GILDESS 1/20) 1-20 MG-MCG tablet Take 1 tablet by mouth daily.    Marland Kitchen nystatin (NYSTOP)  100000 UNIT/GM POWD Apply topically 2 (two) times daily as needed. Apply to affected area two times a day until clear asneeded     . omeprazole (PRILOSEC) 20 MG capsule Take 1 capsule (20 mg total) by mouth 2 (two) times daily. 60 capsule 5  . oxybutynin (DITROPAN XL) 15 MG 24 hr tablet Take 1 tablet (15 mg total) by mouth daily. 30 tablet 11  . PARoxetine Mesylate (BRISDELLE) 7.5 MG CAPS Take 7.5 mg by mouth daily.    Marland Kitchen spironolactone (ALDACTONE) 50 MG tablet Take 1 tablet (50 mg total) by mouth daily. 30 tablet 3  . talc (ZEASORB) powder Apply topically as needed. 240 g 3  . traMADol (ULTRAM) 50 MG tablet Take 1 tablet (50 mg total) by mouth every 8 (eight) hours as needed. 60 tablet 3  . traZODone (DESYREL) 50 MG tablet Take 6 tablets  By mouth at bedtime. But can take up to 9 tablets daily      No current facility-administered medications on file prior to visit.     Review of Systems Review of Systems  Constitutional: Negative for fever, appetite change,  and unexpected weight change.  Eyes: Negative for pain and visual disturbance.  Respiratory: Negative for cough and shortness of breath.   Cardiovascular: Negative for cp or palpitations    Gastrointestinal: Negative for nausea, diarrhea and constipation.  Genitourinary: Negative for urgency and frequency.  Skin: Negative for pallor or rash   Neurological: Negative for weakness, light-headedness, numbness and headaches.  Hematological: Negative for adenopathy. Does not bruise/bleed easily.  Psychiatric/Behavioral: pos for worsening anxiety and depression        Objective:  Physical Exam  Constitutional: She appears well-developed and well-nourished. No distress.  Morbidly obese and depressed appearing   HENT:  Head: Normocephalic and atraumatic.  Mouth/Throat: Oropharynx is clear and moist.  Eyes: Conjunctivae and EOM are normal. Pupils are equal, round, and reactive to light. No scleral icterus.  Neck: Normal range of  motion. Neck supple. No JVD present. Carotid bruit is not present. No thyromegaly present.  Cardiovascular: Normal rate, regular rhythm, normal heart sounds and intact distal pulses.  Exam reveals no gallop.   Pulmonary/Chest: Effort normal and breath sounds normal. No respiratory distress. She has no wheezes. She has no rales.  Abdominal: Soft. Bowel sounds are normal. She exhibits no distension and no mass. There is no tenderness.  Musculoskeletal: She exhibits no edema.  Lymphadenopathy:    She has no cervical adenopathy.  Neurological: She is alert. She has normal reflexes. No cranial nerve deficit. She exhibits normal muscle tone. Coordination normal.  Skin: Skin is warm and dry. No rash noted. No erythema. No pallor.  Psychiatric: Her mood appears anxious. Her affect is labile. Her affect is not blunt. She is slowed. Thought content is not paranoid. She exhibits a depressed mood. She expresses no homicidal and no suicidal ideation.  Tearful at times           Assessment & Plan:   Problem List Items Addressed This Visit      Endocrine   Type 2 diabetes, controlled, with neuropathy - Primary    Now less well controlled Lab Results  Component Value Date   HGBA1C 8.9* 03/31/2014   irratic eating - compulsive overeating mixed with times of fasting -skips meals/ very picky, cannot seem to put together a dm eating plan despite work with nutritionist Will continue this  Depression plays a big role Ref to endocrine inst not to take glipizide unless she eats regular meals re: risk of hypoglycemia       Relevant Orders   Ambulatory referral to Endocrinology     Other   Hyperlipidemia LDL goal <100    Triglycerides on the rise Some rel to sugar control Intol of statin Will try tricor low dose Schedule f/u lab Long disc re: low fat diet      Relevant Medications   fenofibrate (TRICOR) tablet   Morbid obesity with body mass index of 60.0-69.9 in adult    This seems to be  linked to eating disorder/picky eating and depression Discussed how this problem influences overall health and the risks it imposes  Reviewed plan for weight loss with lower calorie diet (via better food choices and also portion control or program like weight watchers) and exercise building up to or more than 30 minutes 5 days per week including some aerobic activity   Pt is only sometimes motivated  Urged to continue to work with counselor and psychiatrist to control her depression        Other Visit Diagnoses    Vitamin D deficiency        Relevant Medications    cyanocobalamin ((VITAMIN B-12)) injection 1,000 mcg (Completed)

## 2014-04-13 NOTE — Progress Notes (Signed)
Pre visit review using our clinic review tool, if applicable. No additional management support is needed unless otherwise documented below in the visit note. 

## 2014-04-13 NOTE — Patient Instructions (Signed)
Stop at check out for referral to endocrinology for diabetes treatment  Don't take the glipizide unless you do not skip meals  See your nutritionist as planned  For high triglycerides take tricor 48 mg one daily -if any problems or side effects let me know  Schedule fasting lab for cholesterol in 6 weeks

## 2014-04-15 ENCOUNTER — Encounter: Payer: Medicare PPO | Attending: Family Medicine | Admitting: *Deleted

## 2014-04-15 VITALS — Ht 64.0 in | Wt 339.6 lb

## 2014-04-15 DIAGNOSIS — Z6841 Body Mass Index (BMI) 40.0 and over, adult: Secondary | ICD-10-CM | POA: Insufficient documentation

## 2014-04-15 DIAGNOSIS — Z713 Dietary counseling and surveillance: Secondary | ICD-10-CM | POA: Insufficient documentation

## 2014-04-15 DIAGNOSIS — E1140 Type 2 diabetes mellitus with diabetic neuropathy, unspecified: Secondary | ICD-10-CM | POA: Diagnosis present

## 2014-04-15 NOTE — Progress Notes (Signed)
  Medical Nutrition Therapy:  Appt start time: 9371 end time:  1600.  Assessment:  Primary concerns today: 04/15/14.. States her A1c is up to 8.9%. States she is not feeling well. Weight loss of 6 pounds since December visit noted. She is sleeping later and not getting her AM meds. She brought a list of questions for me for this visit. Per last visit, she has had DM 2 for a couple of years. She states she suffers with depression and bipolar disorder along with anxiety attacks and claustrophobia. She struggles with being in crowds such as shopping. She states she has fibromyalgia and PCOS. She has a meter but has not been testing lately.  Not physically active. She would like some guidance in menu planning but did not offer that until the end of our planned visit.  Preferred Learning Style:  Visual  Hands on   Learning Readiness:   Contemplating  Ready  Change in progress  MEDICATIONS: see list, diabetes medication is Glipizide XL   DIETARY INTAKE:  24-hr recall:  B ( AM): has problem with food intake in the AM, and she sleeps late  Snk ( AM): no  L ( PM): depends on what is in the house. May go to [employer] and get a burger, drink such as sweet tea and fries or other food (she thinks in 3's) Snk ( PM): grazes throughout the day D ( PM): She may cook 2-3 nights a week. Then she may not cook for a week or so. She cooks for 4 servings so she can save for a later day. Snk ( PM): no Beverages: soda, water  Usual physical activity: none at this time. She would prefer inside versus outside activities, she is not at all interested in water activities  Estimated energy needs: 1600 calories 180 g carbohydrates 120 g protein 44 g fat  Progress Towards Goal(s):  In progress.   Nutritional Diagnosis:  NI-1.5 Excessive energy intake As related to activity level.  As evidenced by BMI of 59.4 now down to 58.4    Intervention:  Nutrition counseling and diabetes education continued. I  answered her questions which were quite diverse in nature. We discussed how to start buying her foods in such a way that she would have healthy choices in the house. We reviewed some of her more recent meals and how they relate to the concept of Carb Counting and resulting BG control.  Teaching Method Utilized:  Visual Auditory Hands on  Handouts given during visit include: Menu Planning Sheet  Barriers to learning/adherence to lifestyle change: multiple mental health and physical issues  Demonstrated degree of understanding via:  Teach Back   Monitoring/Evaluation:  Dietary intake, exercise, reading food labels, and body weight in 4 week(s).

## 2014-04-15 NOTE — Patient Instructions (Addendum)
Plan:  Aim for 3 Carb Choices per meal (45 grams) +/- 1 either way  Aim for 0-1 Carbs per snack if hungry  Include protein in moderation with your meals and snacks Consider reading food labels for Total Carbohydrate of foods  Ask the new endocrinologist about "Gastroparesis" Try Almond milk on your cereal. You may have 1 bowl of cereal a day if you'd like

## 2014-04-18 NOTE — Assessment & Plan Note (Signed)
Triglycerides on the rise Some rel to sugar control Intol of statin Will try tricor low dose Schedule f/u lab Long disc re: low fat diet

## 2014-04-18 NOTE — Assessment & Plan Note (Signed)
This seems to be linked to eating disorder/picky eating and depression Discussed how this problem influences overall health and the risks it imposes  Reviewed plan for weight loss with lower calorie diet (via better food choices and also portion control or program like weight watchers) and exercise building up to or more than 30 minutes 5 days per week including some aerobic activity   Pt is only sometimes motivated  Urged to continue to work with counselor and psychiatrist to control her depression

## 2014-04-18 NOTE — Assessment & Plan Note (Signed)
Now less well controlled Lab Results  Component Value Date   HGBA1C 8.9* 03/31/2014   irratic eating - compulsive overeating mixed with times of fasting -skips meals/ very picky, cannot seem to put together a dm eating plan despite work with nutritionist Will continue this  Depression plays a big role Ref to endocrine inst not to take glipizide unless she eats regular meals re: risk of hypoglycemia

## 2014-04-19 ENCOUNTER — Ambulatory Visit (INDEPENDENT_AMBULATORY_CARE_PROVIDER_SITE_OTHER): Payer: Medicare PPO | Admitting: Podiatry

## 2014-04-19 DIAGNOSIS — M2040 Other hammer toe(s) (acquired), unspecified foot: Secondary | ICD-10-CM

## 2014-04-19 DIAGNOSIS — E1151 Type 2 diabetes mellitus with diabetic peripheral angiopathy without gangrene: Secondary | ICD-10-CM

## 2014-04-19 NOTE — Progress Notes (Signed)
Diabetic shoes dispensed.Instructions for breakin given.

## 2014-04-27 ENCOUNTER — Encounter (INDEPENDENT_AMBULATORY_CARE_PROVIDER_SITE_OTHER): Payer: Self-pay

## 2014-04-27 ENCOUNTER — Ambulatory Visit: Payer: Medicare PPO | Admitting: Psychology

## 2014-04-27 ENCOUNTER — Encounter: Payer: Self-pay | Admitting: Endocrinology

## 2014-04-27 ENCOUNTER — Ambulatory Visit (INDEPENDENT_AMBULATORY_CARE_PROVIDER_SITE_OTHER): Payer: Medicare PPO | Admitting: Endocrinology

## 2014-04-27 VITALS — BP 120/76 | HR 109 | Resp 18 | Ht 64.0 in | Wt 339.8 lb

## 2014-04-27 DIAGNOSIS — E785 Hyperlipidemia, unspecified: Secondary | ICD-10-CM

## 2014-04-27 DIAGNOSIS — E1140 Type 2 diabetes mellitus with diabetic neuropathy, unspecified: Secondary | ICD-10-CM | POA: Diagnosis not present

## 2014-04-27 DIAGNOSIS — Z6841 Body Mass Index (BMI) 40.0 and over, adult: Secondary | ICD-10-CM | POA: Diagnosis not present

## 2014-04-27 NOTE — Assessment & Plan Note (Signed)
Recently started on Fenofibrate- pt hasn't started medication yet and asked her to do so.

## 2014-04-27 NOTE — Assessment & Plan Note (Signed)
Discussed that last A1c is not controlled. Need for her to start checking her sugars. Use those as learning tools regarding your diet and try not to get frustrated.  Mentioned that she needed help with urinary incontinence and polyuria and I have asked her to follow up with her PCP regarding this. If it is from elevated sugars, then it should get better after they improve.   Discussed that due to her mood issues and eating disorder it is best that she continues to work with nutrition for meal planning.  Discussed principles of healthy eating at this visit.  Discussed checking sugars 2 x daily.  Restart Glipizide for now at 5 mg daily.  Discussed addition of SGLT2 versus GLP1 and discussed risk profiles. She is going to research more about this and will find out by next visit, which of the listed options are covered by her insurance and which one she would be willing to try. Mentioned added benefit of weight loss with these medications, and some appetite control with GLP-1 agents.

## 2014-04-27 NOTE — Progress Notes (Signed)
Pre visit review using our clinic review tool, if applicable. No additional management support is needed unless otherwise documented below in the visit note. 

## 2014-04-27 NOTE — Patient Instructions (Signed)
Check sugars 2 x daily ( before breakfast/lunch/supper and beddtime).  Record them in a log book and bring that/meter to next appointment.   Work with nutrition on better meal planning. earing more vegetables, eating better portion sizes, not skipping meals.   Restart Glipizide at 5 mg daily.   By next visit, please find out which of these agents will be covered under your insurance plan- Trulicity/tanzeum/Bydureon ( injectables) or Invokana ( tablets).   Please come back for a follow-up appointment in 3 weeks

## 2014-04-27 NOTE — Assessment & Plan Note (Signed)
Continue to work on portion control, not skipping meals and continue nutrition follow up.

## 2014-04-27 NOTE — Progress Notes (Signed)
Reason for visit-  Mackenzie Bonilla is a 49 y.o.-year-old female, referred by her PCP,  Tower, Wynelle Fanny, MD for management of Type 2 diabetes, uncontrolled, with complications ( neuropathy). Associated hx Bipolar disease, PCOS, morbid obesity and bulimia. Used to see Dr Lew Dawes at [facility] in remote past for DM.   HPI- Patient has been diagnosed with diabetes in 2014-2015, with prior hx PCOS and PreDM. Recalls being initially on lifestyle modifications. Recently started seeing new nutritionist.  Tried  Metformin, Glipizide. she has not been on insulin before.   *Didn't tolerate metformin due to cramping, GI upset *Gets frustrated very easily. Addicted to sugars. Binges per mood changes, more if depressed. In the morning can't eat anything Then gets to taking her medications later in the day and eats and snacks through the day.  Gets freq kidney stones, urologist told to cut back on root veggies. Now feels that she is very limited with her dietary choices Trying hard to buy better grocery , cook per DM cookbook but not that successful Needs help with meal planning and sees nutr monthly.  * earlier didn't have family support to go through bariatric surgery program and also felt not to be a good candidate psych wise.   Pt is currently on a regimen of:  -Glipizide 5 mg daily ( stopped Feb 2016- stopped due to ?erratic eating)>>now has symptoms of polyuria    Last hemoglobin A1c was: Lab Results  Component Value Date   HGBA1C 8.9* 03/31/2014   HGBA1C 7.6* 12/02/2013   HGBA1C 8.0* 08/11/2013     Pt checks her sugars 0 a day . Uses AccuChek glucometer. By recall/meter download/meter review they are:  PREMEAL Breakfast Lunch Dinner Bedtime Overall  Glucose range:     n/a  Mean/median:        POST-MEAL PC Breakfast PC Lunch PC Dinner  Glucose range:     Mean/median:       Hypoglycemia-  No lows. Lowest sugar was n/a; she has hypoglycemia awareness at 70.   Dietary habits- eats  2 times daily. Sees nutrition. See above Exercise- started walking- sees podiatrist - q 3 months-started to form blisters on balls of feet- got Dm shoes-podiatry asked her to hold off on walking for some time due to this Weight - going up Wt Readings from Last 3 Encounters:  04/27/14 339 lb 12 oz (154.11 kg)  04/15/14 339 lb 9.6 oz (154.042 kg)  04/13/14 338 lb 12.8 oz (153.679 kg)    Diabetes Complications-  Nephropathy- No, ? Developing Stage 2  CKD, last BUN/creatinine- GFR 65-75 Lab Results  Component Value Date   BUN 9 12/02/2013   CREATININE 1.0 12/02/2013   Lab Results  Component Value Date   GFR 65.83 12/02/2013     Retinopathy- No, Last DEE was in 2016 Neuropathy- some numbness and tingling in )her feet. known neuropathy. Sees podiatry and wears DM shoes Associated history - No CAD . No prior stroke. No hypothyroidism. her last TSH was  Lab Results  Component Value Date   TSH 4.48 11/14/2011    Hyperlipidemia-  her last set of lipids were- Currently on fenofibrate ( just started Feb 2016- pt hasnt started med).  Allergic to high dose Zocor ( muscle aches). Lab Results  Component Value Date   CHOL 203* 03/31/2014   HDL 38.10* 03/31/2014   LDLCALC 44 06/17/2009   LDLDIRECT 90.0 03/31/2014   TRIG * 03/31/2014    526.0 Triglyceride is over 400; calculations  on Lipids are invalid.   CHOLHDL 5 03/31/2014    Blood Pressure/HTN- Patient's blood pressure is well controlled today on current regimen.  Pt has FH of DM in grandmothers.  I have reviewed the patient's past medical history, family and social history, surgical history, medications and allergies.  Past Medical History  Diagnosis Date  . Allergy     allergic rhinitis  . Anxiety   . Depression   . Hyperlipidemia   . Arthritis     osteoarthritis  . Obesity   . Lymphedema   . Tachycardia   . Neuromuscular disorder     fibromyalgia  . Hyperhidrosis   . GERD (gastroesophageal reflux disease)     EGD  negative 06/2001// EGD erythematous mucosa, polyp 08/2008  . Fatty liver 07/2008    abd. ultrasound - fatty liver ; slt dilated cbd (no stones ) 06/10// abd.  ultrasound normal on 04/2006  . Diabetes mellitus without complication    Past Surgical History  Procedure Laterality Date  . Tonsillectomy    . Septoplasty    . Foot surgery    . Uterine tumor  09/2001  . Vagus nerve stimulator insertion    . Endometrial biopsy  01/2004  . Uterine fibroid surgery  06/2005    ablation   Family History  Problem Relation Age of Onset  . Hypertension Father   . Cancer Father     pancreatic cancer  . Hypertension Sister   . Heart disease Maternal Grandmother 60    MI  . Diabetes Maternal Grandmother   . Heart disease Paternal Grandmother 35    MI  . Diabetes Paternal Grandmother    History   Social History  . Marital Status: Single    Spouse Name: N/A  . Number of Children: N/A  . Years of Education: N/A   Occupational History  . Not on file.   Social History Main Topics  . Smoking status: Never Smoker   . Smokeless tobacco: Never Used  . Alcohol Use: No  . Drug Use: No  . Sexual Activity: Not on file   Other Topics Concern  . Not on file   Social History Narrative   Current Outpatient Prescriptions on File Prior to Visit  Medication Sig Dispense Refill  . acetaminophen (TYLENOL) 500 MG tablet Take 500 mg by mouth every 6 (six) hours as needed. OTC as directed     . albuterol (PROVENTIL HFA;VENTOLIN HFA) 108 (90 BASE) MCG/ACT inhaler Inhale 2 puffs into the lungs every 4 (four) hours as needed for wheezing.    Marland Kitchen allopurinol (ZYLOPRIM) 100 MG tablet Take 1 tablet (100 mg total) by mouth daily. 30 tablet 11  . ALPRAZolam (XANAX) 1 MG tablet Take 2 mg by mouth 3 (three) times daily as needed.     Marland Kitchen aluminum chloride (DRYSOL) 20 % external solution Apply topically at bedtime. Apply to affected areas at bedtime - then wash off in the AM once sweating improves - can use twice  weekly     . benzonatate (TESSALON) 200 MG capsule TAKE ONE CAPSULE THREE TIMES A DAY AS NEEDED FOR COUGH--SWALLOW WHOLE--DO NOT CHEW 90 capsule 3  . buPROPion (WELLBUTRIN XL) 150 MG 24 hr tablet 450 mg daily.     . calcium carbonate (TUMS - DOSED IN MG ELEMENTAL CALCIUM) 500 MG chewable tablet Chew 1 tablet by mouth as needed.    . Cholecalciferol (VITAMIN D) 1000 UNITS capsule Take 1,000 Units by mouth daily.      Marland Kitchen  cloNIDine (CATAPRES) 0.2 MG tablet Take 0.2 mg by mouth at bedtime.     . cyanocobalamin (,VITAMIN B-12,) 1000 MCG/ML injection Inject 1,000 mcg into the muscle once a month. 1 mL 5  . cyclobenzaprine (FLEXERIL) 5 MG tablet TAKE 1 TABLET 3 TIMES DAILY AS NEEDED FOR MUSCLE SPASM 30 tablet 1  . dimenhyDRINATE (DRAMAMINE) 50 MG tablet Take 50 mg by mouth every 8 (eight) hours as needed.    . fenofibrate (TRICOR) 48 MG tablet Take 1 tablet (48 mg total) by mouth daily. 30 tablet 11  . FLUoxetine (PROZAC) 40 MG capsule Take 80 mg by mouth daily.     . fluticasone (FLONASE) 50 MCG/ACT nasal spray Place 2 sprays into both nostrils daily. 16 g 6  . fluticasone-salmeterol (ADVAIR HFA) 115-21 MCG/ACT inhaler Inhale 2 puffs into the lungs 2 (two) times daily.    Marland Kitchen GLIPIZIDE XL 5 MG 24 hr tablet TAKE 1 TABLET ONCE DAILY WITH BREAKFAST 30 tablet 0  . glycopyrrolate (ROBINUL) 1 MG tablet Take 2 tablets by mouth twice daily as needed for abdominal cramping 120 tablet 5  . guaifenesin (REFENESEN 400) 400 MG TABS Take 400 mg by mouth every 4 (four) hours as needed. OTC as directed    . ketoconazole (NIZORAL) 2 % cream Apply topically daily. Apply to affected area once daily for 2 weeks     . mometasone (ELOCON) 0.1 % cream Apply 1 application topically daily. Apply to affected area once daily 15 g 0  . naphazoline-glycerin (CLEAR EYES) 0.012-0.2 % SOLN Place 1-2 drops into both eyes every 4 (four) hours as needed. OTC as directed     . norethindrone-ethinyl estradiol (GILDESS 1/20) 1-20 MG-MCG tablet  Take 1 tablet by mouth daily.    Marland Kitchen nystatin (NYSTOP) 100000 UNIT/GM POWD Apply topically 2 (two) times daily as needed. Apply to affected area two times a day until clear asneeded     . omeprazole (PRILOSEC) 20 MG capsule Take 1 capsule (20 mg total) by mouth 2 (two) times daily. 60 capsule 5  . oxybutynin (DITROPAN XL) 15 MG 24 hr tablet Take 1 tablet (15 mg total) by mouth daily. 30 tablet 11  . PARoxetine Mesylate (BRISDELLE) 7.5 MG CAPS Take 7.5 mg by mouth daily.    Marland Kitchen spironolactone (ALDACTONE) 50 MG tablet Take 1 tablet (50 mg total) by mouth daily. 30 tablet 3  . talc (ZEASORB) powder Apply topically as needed. 240 g 3  . traMADol (ULTRAM) 50 MG tablet Take 1 tablet (50 mg total) by mouth every 8 (eight) hours as needed. 60 tablet 3  . traZODone (DESYREL) 50 MG tablet Take 6 tablets  By mouth at bedtime. But can take up to 9 tablets daily      No current facility-administered medications on file prior to visit.   Allergies  Allergen Reactions  . Cephalexin   . Codeine     REACTION: ? reaction  . Duloxetine   . Hydrocodone     REACTION: ? reaction  . Levaquin [Levofloxacin] Nausea Only  . Metformin And Related Diarrhea    GI side effects   . Norelgestromin-Eth Estradiol   . Prednisone     Tolerates intraarticular depo-medrol.  . Quetiapine   . Sertraline Hcl     REACTION: vivid dreams  . Triazolam     REACTION: ? reaction  . Zaleplon   . Zocor [Simvastatin - High Dose]     achey  . Zolpidem Tartrate     REACTION: ?  reaction     Review of Systems: [x]  complains of  [  ] denies General:   [ x ] Recent weight change [x  ] Fatigue  [ x ] Loss of appetite Eyes: [x  ]  Vision Difficulty [  ]  Eye pain ENT: [  ]  Hearing difficulty [  ]  Difficulty Swallowing CVS: [  ] Chest pain [  ]  Palpitations/Irregular Heart beat [  ]  Shortness of breath lying flat [x  ] Swelling of legs Resp: [ x ] Frequent Cough [ x ] Shortness of Breath  [ x ]  Wheezing GI: [x  ] Heartburn  [x  ]  Nausea , no Vomiting  [ x ] Diarrhea [ x ] Constipation  [ x ] Abdominal Pain GU: [ x ]  Polyuria  [x  ]  nocturia Bones/joints:  [  ]  Muscle aches  [x  ] Joint Pain  [ x ] Bone pain Skin/Hair/Nails: [  ]  Rash  [  ] New stretch marks [ x ]  Itching [x  ] Hair loss [x  ]  Excessive hair growth Reproduction: [  ] Low sexual desire , [  ]  Women: Menstrual cycle problems [  ]  Women: Breast Discharge [  ] Men: Difficulty with erections [  ]  Men: Enlarged Breasts CNS: [  ] Frequent Headaches [  ] Blurry vision [  ] Tremors [  ] Seizures [  ] Loss of consciousness [  x] Localized weakness-wrists and knees Endocrine: [ x ]  Excess thirst [ x ]  Feeling excessively hot [x  ]  Feeling excessively cold Heme: [  ]  Easy bruising [  ]  Enlarged glands or lumps in neck Allergy: [ x ]  Food allergies [ x ] Environmental allergies  PE: BP 120/76 mmHg  Pulse 109  Resp 18  Ht 5\' 4"  (1.626 m)  Wt 339 lb 12 oz (154.11 kg)  BMI 58.29 kg/m2  SpO2 96% Wt Readings from Last 3 Encounters:  04/27/14 339 lb 12 oz (154.11 kg)  04/15/14 339 lb 9.6 oz (154.042 kg)  04/13/14 338 lb 12.8 oz (153.679 kg)   GENERAL: No acute distress, well developed, obese HEENT:  Eye exam shows normal external appearance. Oral exam shows normal mucosa .  NECK:   Neck exam shows no lymphadenopathy. No Carotids bruits. Thyroid is not enlarged and no nodules felt.   LUNGS:         Chest is symmetrical. Lungs are clear to auscultation.Marland Kitchen   HEART:         Heart sounds:  S1 and S2 are normal. No murmurs or clicks heard. ABDOMEN:  No Distention present. Liver and spleen are not palpable. No other mass or tenderness present. limited exam EXTREMITIES:     There is no edema.   NEUROLOGICAL:     Grossly intact.            Diabetic foot exam deferred as she needed help with footwear removal  SKIN:       No rash  ASSESSMENT AND PLAN: Problem List Items Addressed This Visit      Endocrine   Type 2 diabetes, controlled, with neuropathy     Discussed that last A1c is not controlled. Need for her to start checking her sugars. Use those as learning tools regarding your diet and try not to get frustrated.  Mentioned that she needed help with urinary  incontinence and polyuria and I have asked her to follow up with her PCP regarding this. If it is from elevated sugars, then it should get better after they improve.   Discussed that due to her mood issues and eating disorder it is best that she continues to work with nutrition for meal planning.  Discussed principles of healthy eating at this visit.  Discussed checking sugars 2 x daily.  Restart Glipizide for now at 5 mg daily.  Discussed addition of SGLT2 versus GLP1 and discussed risk profiles. She is going to research more about this and will find out by next visit, which of the listed options are covered by her insurance and which one she would be willing to try. Mentioned added benefit of weight loss with these medications, and some appetite control with GLP-1 agents.         Other   Hyperlipidemia LDL goal <100    Recently started on Fenofibrate- pt hasn't started medication yet and asked her to do so.       Morbid obesity with body mass index of 60.0-69.9 in adult - Primary    Continue to work on portion control, not skipping meals and continue nutrition follow up.            - Return to clinic in 3 weeks with sugar log/meter.  Jemima Petko [facility] 04/27/2014 3:14 PM

## 2014-04-28 ENCOUNTER — Telehealth: Payer: Self-pay

## 2014-04-28 NOTE — Telephone Encounter (Signed)
Well, we expect some weight loss with both lines of treatment. GLP-1 has the added benefit of suppressing appetite.  The weekly GLP-1 agents have similar GI side effects with the most convenience- Trulicity pen is simplest to use if she wants to try that.  There are no generics, unless her plan prefers daily Victoza injection ( GLP-1) or twice daily ( Byetta- GLP-1). These might be lower co pay. The number of injections go up though.  Please ask her to find out. thanks

## 2014-04-28 NOTE — Telephone Encounter (Signed)
I think this is supposed to go to Dr Howell Rucks -I will forward it

## 2014-04-28 NOTE — Telephone Encounter (Signed)
Patient to let me know that she checked on the prices of the medication and they all are too expensive for the patient.  Trulicity $85 Tranzeum $46 Bydureon [id number] Invokana $40 Patient stated that he would need a discount card or a medication that has a generic. She wanted to know if the bydureon would help with significant weight loss. Please advise

## 2014-04-29 NOTE — Telephone Encounter (Signed)
Spoke to patient to notify her of Dr. Boyd Kerbs comments. Patient verbalized understanding. Stated that she call to check prices for victoza and byetta and give me call back. Awaiting patient's call at this time.

## 2014-04-29 NOTE — Telephone Encounter (Signed)
Called patient, no answer. Left voicemail on patient's phone reqesting a callback. Left office phone number on vic[facility] as well.

## 2014-05-04 ENCOUNTER — Ambulatory Visit: Payer: Medicare PPO | Admitting: Psychology

## 2014-05-04 ENCOUNTER — Ambulatory Visit (INDEPENDENT_AMBULATORY_CARE_PROVIDER_SITE_OTHER): Payer: Medicare PPO | Admitting: Psychology

## 2014-05-04 ENCOUNTER — Other Ambulatory Visit: Payer: Self-pay

## 2014-05-04 DIAGNOSIS — F332 Major depressive disorder, recurrent severe without psychotic features: Secondary | ICD-10-CM

## 2014-05-04 MED ORDER — OXYBUTYNIN CHLORIDE ER 15 MG PO TB24: 15.0000 mg | ORAL_TABLET | Freq: Every day | ORAL | Status: DC

## 2014-05-04 NOTE — Telephone Encounter (Signed)
Called patient, no answer. Left voicemail on home phone requesting a callback to discuss medication options. Left callback number for patient to reach me.

## 2014-05-04 NOTE — Telephone Encounter (Signed)
Oxybutynin refilled.

## 2014-05-05 NOTE — Telephone Encounter (Signed)
sooner she starts the medication- the better. She can continue on glipizide for now like discussed in clinic. Okay to do the April 6th visit for pen teaching.

## 2014-05-05 NOTE — Telephone Encounter (Signed)
Noted  

## 2014-05-05 NOTE — Telephone Encounter (Signed)
Spoke to patient to notify her of Dr. Boyd Kerbs comments. Patient stated she will wait until office visit before starting the medication. Patient has more questions and would like to discuss further with dr. Howell Rucks before starting the medication. Patient wants to make she its the right choice before spending the co pay.

## 2014-05-05 NOTE — Telephone Encounter (Signed)
Victoza $40 Byetta needs prior authorization

## 2014-05-05 NOTE — Telephone Encounter (Signed)
Patient would like to start the trulicity but does not want to start until her appt on April 6 so I can teach her how to use the pen. Explained to patient that I could teach her during a nurse visit but patient stated that she has a hard time coming to appts and she already has enough scheduled within the next 2wks. Patient wants to be taught during her office visit on April 6th. Is that okay or does patient need to come in sooner?

## 2014-05-10 ENCOUNTER — Telehealth: Payer: Self-pay

## 2014-05-10 MED ORDER — GLIPIZIDE ER 5 MG PO TB24: ORAL_TABLET | ORAL | Status: DC

## 2014-05-10 NOTE — Telephone Encounter (Signed)
Patient need refill on Rx sent to local pharmacy.

## 2014-05-19 ENCOUNTER — Ambulatory Visit (INDEPENDENT_AMBULATORY_CARE_PROVIDER_SITE_OTHER): Payer: Medicare PPO | Admitting: Endocrinology

## 2014-05-19 ENCOUNTER — Other Ambulatory Visit: Payer: Self-pay | Admitting: Family Medicine

## 2014-05-19 VITALS — BP 124/82 | HR 90 | Resp 16 | Ht 64.0 in | Wt 333.2 lb

## 2014-05-19 DIAGNOSIS — Z6841 Body Mass Index (BMI) 40.0 and over, adult: Secondary | ICD-10-CM

## 2014-05-19 DIAGNOSIS — E1140 Type 2 diabetes mellitus with diabetic neuropathy, unspecified: Secondary | ICD-10-CM

## 2014-05-19 MED ORDER — DULAGLUTIDE 0.75 MG/0.5ML ~~LOC~~ SOAJ: 0.7500 mg | SUBCUTANEOUS | Status: DC

## 2014-05-19 NOTE — Telephone Encounter (Signed)
Tramadol refill request.  Last seen 04/13/2014.  Last filled 11/19/2012.  Please advise.

## 2014-05-19 NOTE — Patient Instructions (Addendum)
Check sugars 2 x daily ( before breakfast and before supper/rotating time checks).  Record them in a log book and bring that/meter to next appointment.   Continue working with dietary efforts and weight loss efforts with help of nutrition.   Continue current Glipizide for now.  If you start to notice numbers below 100, then let me know so that Glipizide could be adjusted.   Start Trulicity 7.48 mg Traill weekly. Report back if donot tolerate due to nausea, vomiting, upper abdominal pain radiating to back. Try cutting back on portion size, greasy foods if develop nausea. Try ginger if you develop nausea.   Please come back for a follow-up appointment in 1 month.

## 2014-05-19 NOTE — Progress Notes (Signed)
Reason for visit-  Mackenzie Bonilla is a 49 y.o.-year-old female,  For follow up management of Type 2 diabetes, uncontrolled, with complications ( neuropathy). Associated hx Bipolar disease, PCOS, morbid obesity and bulimia. Used to see Dr Lew Dawes at [facility] in remote past for DM.   HPI- Patient has been diagnosed with diabetes in 2014-2015, with prior hx PCOS and PreDM. Recalls being initially on lifestyle modifications. Recently started seeing new nutritionist.  Tried  Metformin, Glipizide. she has not been on insulin before.   *Didn't tolerate metformin due to cramping, GI upset *Gets frustrated very easily. Addicted to sugars. Binges per mood changes, more if depressed. In the morning can't eat anything Then gets to taking her medications later in the day and eats and snacks through the day.  Gets freq kidney stones, urologist told to cut back on root veggies. Now feels that she is very limited with her dietary choices Trying hard to buy better grocery , cook per DM cookbook but not that successful Needs help with meal planning and sees nutr monthly.  * earlier didn't have family support to go through bariatric surgery program and also felt not to be a good candidate psych wise.   Pt is currently on a regimen of:  -Glipizide 5 mg daily ( restarted 3 weeks ago- tolerating well)    Last hemoglobin A1c was: Lab Results  Component Value Date   HGBA1C 8.9* 03/31/2014   HGBA1C 7.6* 12/02/2013   HGBA1C 8.0* 08/11/2013     Pt checks her sugars 2 a day . Uses AccuChek glucometer. By sugar log they are:  PREMEAL Breakfast Lunch Dinner Bedtime Overall  Glucose range: 105-191   115-155   Mean/median:        POST-MEAL PC Breakfast PC Lunch PC Dinner  Glucose range:     Mean/median:      * higher sugars with dietary discretions * done much much better with dietary modifications   Hypoglycemia-  No lows. Lowest sugar was n/a; she has hypoglycemia awareness at 70.   Dietary  habits- eats 2 times daily. Sees nutrition. See above Exercise- started walking- sees podiatrist - q 3 months-started to form blisters on balls of feet- got Dm shoes-podiatry asked her to hold off on walking for some time due to this. Going to get stretch bands through [facility] Weight - going down Wt Readings from Last 3 Encounters:  05/19/14 333 lb 4 oz (151.161 kg)  04/27/14 339 lb 12 oz (154.11 kg)  04/15/14 339 lb 9.6 oz (154.042 kg)    Diabetes Complications-  Nephropathy- No, ? Developing Stage 2  CKD, last BUN/creatinine- GFR 65-75 Lab Results  Component Value Date   BUN 9 12/02/2013   CREATININE 1.0 12/02/2013   Lab Results  Component Value Date   GFR 65.83 12/02/2013   No results found for: MICRALBCREAT     Retinopathy- No, Last DEE was in 2016 Neuropathy- some numbness and tingling in )her feet. known neuropathy. Sees podiatry and wears DM shoes Associated history - No CAD . No prior stroke. No hypothyroidism. her last TSH was  Lab Results  Component Value Date   TSH 4.48 11/14/2011    Hyperlipidemia-  her last set of lipids were- Currently on fenofibrate ( just started Feb 2016- pt hasnt started med).  Allergic to high dose Zocor ( muscle aches). Lab Results  Component Value Date   CHOL 203* 03/31/2014   HDL 38.10* 03/31/2014   LDLCALC 44 06/17/2009   LDLDIRECT  90.0 03/31/2014   TRIG * 03/31/2014    526.0 Triglyceride is over 400; calculations on Lipids are invalid.   CHOLHDL 5 03/31/2014    Blood Pressure/HTN- Patient's blood pressure is well controlled today on current regimen.    I have reviewed the patient's past medical history, medications and allergies.   Current Outpatient Prescriptions on File Prior to Visit  Medication Sig Dispense Refill  . acetaminophen (TYLENOL) 500 MG tablet Take 500 mg by mouth every 6 (six) hours as needed. OTC as directed     . albuterol (PROVENTIL HFA;VENTOLIN HFA) 108 (90 BASE) MCG/ACT inhaler Inhale 2 puffs into the  lungs every 4 (four) hours as needed for wheezing.    Marland Kitchen allopurinol (ZYLOPRIM) 100 MG tablet Take 1 tablet (100 mg total) by mouth daily. 30 tablet 11  . ALPRAZolam (XANAX) 1 MG tablet Take 2 mg by mouth 3 (three) times daily as needed.     . benzonatate (TESSALON) 200 MG capsule TAKE ONE CAPSULE THREE TIMES A DAY AS NEEDED FOR COUGH--SWALLOW WHOLE--DO NOT CHEW 90 capsule 3  . buPROPion (WELLBUTRIN XL) 150 MG 24 hr tablet 450 mg daily.     . calcium carbonate (TUMS - DOSED IN MG ELEMENTAL CALCIUM) 500 MG chewable tablet Chew 1 tablet by mouth as needed.    . Cholecalciferol (VITAMIN D) 1000 UNITS capsule Take 1,000 Units by mouth daily.      . cloNIDine (CATAPRES) 0.2 MG tablet Take 0.2 mg by mouth at bedtime.     . cyanocobalamin (,VITAMIN B-12,) 1000 MCG/ML injection Inject 1,000 mcg into the muscle once a month. 1 mL 5  . cyclobenzaprine (FLEXERIL) 5 MG tablet TAKE 1 TABLET 3 TIMES DAILY AS NEEDED FOR MUSCLE SPASM 30 tablet 1  . dimenhyDRINATE (DRAMAMINE) 50 MG tablet Take 50 mg by mouth every 8 (eight) hours as needed.    . fenofibrate (TRICOR) 48 MG tablet Take 1 tablet (48 mg total) by mouth daily. 30 tablet 11  . FLUoxetine (PROZAC) 40 MG capsule Take 80 mg by mouth daily.     . fluticasone-salmeterol (ADVAIR HFA) 115-21 MCG/ACT inhaler Inhale 2 puffs into the lungs 2 (two) times daily.    Marland Kitchen glipiZIDE (GLIPIZIDE XL) 5 MG 24 hr tablet TAKE 1 TABLET ONCE DAILY WITH BREAKFAST 30 tablet 5  . glycopyrrolate (ROBINUL) 1 MG tablet Take 2 tablets by mouth twice daily as needed for abdominal cramping 120 tablet 5  . guaifenesin (REFENESEN 400) 400 MG TABS Take 400 mg by mouth every 4 (four) hours as needed. OTC as directed    . mometasone (ELOCON) 0.1 % cream Apply 1 application topically daily. Apply to affected area once daily 15 g 0  . naphazoline-glycerin (CLEAR EYES) 0.012-0.2 % SOLN Place 1-2 drops into both eyes every 4 (four) hours as needed. OTC as directed     . norethindrone-ethinyl  estradiol (GILDESS 1/20) 1-20 MG-MCG tablet Take 1 tablet by mouth daily.    Marland Kitchen nystatin (NYSTOP) 100000 UNIT/GM POWD Apply topically 2 (two) times daily as needed. Apply to affected area two times a day until clear asneeded     . omeprazole (PRILOSEC) 20 MG capsule Take 1 capsule (20 mg total) by mouth 2 (two) times daily. 60 capsule 5  . oxybutynin (DITROPAN XL) 15 MG 24 hr tablet Take 1 tablet (15 mg total) by mouth daily. 90 tablet 3  . PARoxetine Mesylate (BRISDELLE) 7.5 MG CAPS Take 7.5 mg by mouth daily.    Marland Kitchen spironolactone (ALDACTONE)  50 MG tablet Take 1 tablet (50 mg total) by mouth daily. 30 tablet 3  . talc (ZEASORB) powder Apply topically as needed. 240 g 3  . traMADol (ULTRAM) 50 MG tablet Take 1 tablet (50 mg total) by mouth every 8 (eight) hours as needed. 60 tablet 3  . traZODone (DESYREL) 50 MG tablet Take 6 tablets  By mouth at bedtime. But can take up to 9 tablets daily      No current facility-administered medications on file prior to visit.   Allergies  Allergen Reactions  . Cephalexin   . Codeine     REACTION: ? reaction  . Duloxetine   . Hydrocodone     REACTION: ? reaction  . Levaquin [Levofloxacin] Nausea Only  . Metformin And Related Diarrhea    GI side effects   . Norelgestromin-Eth Estradiol   . Prednisone     Tolerates intraarticular depo-medrol.  . Quetiapine   . Sertraline Hcl     REACTION: vivid dreams  . Triazolam     REACTION: ? reaction  . Zaleplon   . Zocor [Simvastatin - High Dose]     achey  . Zolpidem Tartrate     REACTION: ? reaction    Review of Systems- [ x ]  Complains of    [  ]  denies [  ] Recent weight change [ x ]  Fatigue [ x ] polydipsia [  ] polyuria [  ]  nocturia [ x ]  vision difficulty [  ] chest pain [  ] shortness of breath [ x ] leg swelling [  x] cough [ x ] nausea/vomiting [  ] diarrhea [ x ] constipation [  ] abdominal pain [x  ]  tingling/numbness in extremities [  ]  concern with feet ( wounds/sores)   PE: BP  124/82 mmHg  Pulse 90  Resp 16  Ht 5\' 4"  (1.626 m)  Wt 333 lb 4 oz (151.161 kg)  BMI 57.17 kg/m2  SpO2 97% Wt Readings from Last 3 Encounters:  05/19/14 333 lb 4 oz (151.161 kg)  04/27/14 339 lb 12 oz (154.11 kg)  04/15/14 339 lb 9.6 oz (154.042 kg)       Diabetic foot exam deferred as she needed help with footwear removal    ASSESSMENT AND PLAN: Problem List Items Addressed This Visit      Endocrine   Type 2 diabetes, controlled, with neuropathy - Primary    Congratulated her on recent lifestyle efforts. Her sugars have improved since last time on the log.  She still struggles with binge eating and we discussed the role of GLP-1 therapy in appetite control. She is going to cut back the freq of her therapy sessions in order to pay for the medication.   Continue to follow up with nutrition. Discussed checking sugars 2 x daily.  Continue Glipizide for now at 5 mg daily. If start to see lows, then notify me for further changes.  She is agreeable to try Trulicity. Pen teaching given. Start at 0.75 mg [place] weekly.  Discussed risk of pancreatitis and common side effects - aware to report back if does not tolerate.          Relevant Medications   Dulaglutide (TRULICITY) 5.36 UY/4.0HK SOPN     Other   Morbid obesity with body mass index of 60.0-69.9 in adult    Continue to work on portion control, not skipping meals and continue nutrition follow up.   Congratulated her  on her dietary effort and recent weight loss.  We discussed at length regarding binge eating and avoiding too restrictive of a diet.       Relevant Medications   Dulaglutide (TRULICITY) 9.15 WC/1.3SC SOPN        - Return to clinic in 4 weeks with sugar log/meter. 25 minutes spent with the patient, >50% time spent on discussion of topics mentioned above.   Io Dieujuste [facility] 05/19/2014 2:49 PM

## 2014-05-19 NOTE — Telephone Encounter (Signed)
Phoned in medication to [facility], patient notified.

## 2014-05-19 NOTE — Assessment & Plan Note (Addendum)
Congratulated her on recent lifestyle efforts. Her sugars have improved since last time on the log.  She still struggles with binge eating and we discussed the role of GLP-1 therapy in appetite control. She is going to cut back the freq of her therapy sessions in order to pay for the medication.   Continue to follow up with nutrition. Discussed checking sugars 2 x daily.  Continue Glipizide for now at 5 mg daily. If start to see lows, then notify me for further changes.  She is agreeable to try Trulicity. Pen teaching given. Start at 0.75 mg [place] weekly.  Discussed risk of pancreatitis and common side effects - aware to report back if does not tolerate.

## 2014-05-19 NOTE — Progress Notes (Signed)
Pre visit review using our clinic review tool, if applicable. No additional management support is needed unless otherwise documented below in the visit note. 

## 2014-05-19 NOTE — Assessment & Plan Note (Signed)
Continue to work on portion control, not skipping meals and continue nutrition follow up.   Congratulated her on her dietary effort and recent weight loss.  We discussed at length regarding binge eating and avoiding too restrictive of a diet.

## 2014-05-19 NOTE — Telephone Encounter (Signed)
Px written for call in   

## 2014-05-20 ENCOUNTER — Encounter: Payer: Self-pay | Admitting: *Deleted

## 2014-05-20 ENCOUNTER — Other Ambulatory Visit: Payer: Self-pay | Admitting: Family Medicine

## 2014-05-20 ENCOUNTER — Encounter: Payer: Medicare PPO | Attending: Family Medicine | Admitting: *Deleted

## 2014-05-20 VITALS — Ht 64.0 in | Wt 334.0 lb

## 2014-05-20 DIAGNOSIS — E1140 Type 2 diabetes mellitus with diabetic neuropathy, unspecified: Secondary | ICD-10-CM | POA: Diagnosis not present

## 2014-05-20 DIAGNOSIS — Z713 Dietary counseling and surveillance: Secondary | ICD-10-CM | POA: Diagnosis not present

## 2014-05-20 DIAGNOSIS — Z6841 Body Mass Index (BMI) 40.0 and over, adult: Secondary | ICD-10-CM | POA: Diagnosis not present

## 2014-05-20 NOTE — Telephone Encounter (Signed)
Last OV 04/13/14, Last

## 2014-05-20 NOTE — Telephone Encounter (Signed)
Request was for Tramadol. It was filled by Dr. Glori Bickers on 05/19/2014.

## 2014-05-20 NOTE — Progress Notes (Signed)
  Medical Nutrition Therapy:  Appt start time: 1400 end time:  1500.  Assessment:  Primary concerns today: 05/20/14.  She returns for follow up visit for Diabetes and Obesity. She is in pain today with severe headache and jaw pain. She states she saw a new MD yesterday, an Endocrinologist in [place], who is suggesting additional medications to help control her BG better. She has questions about how these medications work and expresses concern over their cost. She acknowledged the additional 6 pounds she has lost in past 4 weeks, though wishes it was more. She is also very concerned over a letter she received yesterday that indicated there were some discrepancies with her taxes and she is anxious about what that might mean.  She has started cooking more at home and reviewed some recipes she has tried. She brought her BG record sheet and is recording twice a day, fasting and 2 hour post any meal of the day. Most are within [employer].  Preferred Learning Style:  Visual  Hands on   Learning Readiness:   Contemplating  Ready  Change in progress  MEDICATIONS: see list, diabetes medication is Glipizide XL   DIETARY INTAKE:  24-hr recall:  B ( AM): has problem with food intake in the AM, and she sleeps late  Snk ( AM): no  L ( PM): depends on what is in the house. May go to [employer] and get a burger, drink such as sweet tea and fries or other food (she thinks in 3's) Snk ( PM): grazes throughout the day D ( PM): She may cook 2-3 nights a week. Then she may not cook for a week or so. She cooks for 4 servings so she can save for a later day. Snk ( PM): no Beverages: soda, water  Usual physical activity: none at this time. She would prefer inside versus outside activities, she is not at all interested in water activities  Estimated energy needs: 1600 calories 180 g carbohydrates 120 g protein 44 g fat  Progress Towards Goal(s):  In progress.   Nutritional Diagnosis:  NI-1.5  Excessive energy intake As related to activity level.  As evidenced by BMI of 59.4 now down to 58.4    Intervention:  Nutrition counseling and diabetes education continued. I commended her on trying new recipes she is finding in [place]. Reviewed carb content of these choices and discussed future ideas to try. Also reviewed portion control to assist with further weight loss.   Teaching Method Utilized:  Visual Auditory Hands on  Handouts given during visit include: Menu Planning Sheet  Barriers to learning/adherence to lifestyle change: multiple mental health and physical issues  Demonstrated degree of understanding via:  Teach Back   Monitoring/Evaluation:  Dietary intake, exercise, reading food labels, and body weight in 4 week(s).

## 2014-05-21 ENCOUNTER — Telehealth: Payer: Self-pay | Admitting: Family Medicine

## 2014-05-21 NOTE — Telephone Encounter (Signed)
Thanks -will look for those

## 2014-05-21 NOTE — Telephone Encounter (Signed)
[facility] Patient Name: Mackenzie Bonilla DOB: [birth year] Initial Comment Caller states she has a headache. Nurse Assessment Nurse: Mechele Dawley, RN, Amy Date/Time Eilene Ghazi Time): 05/21/2014 2:07:02 PM Confirm and document reason for call. If symptomatic, describe symptoms. ---Headache all week. She states even her scalp hurts. Right ear and right side of her nose hurts. She has been to the dentist to see if it has been her teeth and they are fine. She has been taking tramadol, flexeril and Tylenol. She states that when it hurts its very intense . She can not get relief. She states she is not sure if it is sinus infection or tmj may be causing her some issues. She does not have migraines. Pain level is a 10/10 Has the patient traveled out of the country within the last 30 days? ---Not Applicable Does the patient require triage? ---Yes Related visit to physician within the last 2 weeks? ---No Does the PT have any chronic conditions? (i.e. diabetes, asthma, etc.) ---Yes List chronic conditions. ---diabetic, Did the patient indicate they were pregnant? ---No Guidelines Guideline Title Affirmed Question Affirmed Notes Headache [1] SEVERE headache (e.g., excruciating) AND [2] "worst headache" of life Final Disposition User Go to ED Now (or PCP triage) Mechele Dawley, RN, Amy Comments caller may go to urgent care. she does not want to go to the ed. tried to get her an appt with the office - triage nurse checked everyone's schedule and no one had an opening today. she did not want to go to [facility] tomorrow. called office to speak with nurse to report patient doesn't want to go to ed and she is with a patient. left message with the office stafff.

## 2014-05-21 NOTE — Telephone Encounter (Signed)
Amy @ [facility] called She stated ms Mackenzie Bonilla refused to go to er for her headaches She stated she will either go to urgent care or follow up next week  Amy will also sent her notes

## 2014-05-21 NOTE — Telephone Encounter (Signed)
Pt will go to [facility]

## 2014-05-24 ENCOUNTER — Telehealth: Payer: Self-pay | Admitting: Family Medicine

## 2014-05-24 DIAGNOSIS — E785 Hyperlipidemia, unspecified: Secondary | ICD-10-CM

## 2014-05-24 NOTE — Telephone Encounter (Signed)
-----   Message from Ellamae Sia sent at 05/20/2014  3:56 PM EDT ----- Regarding: Lab orders for Tuesday, 4.12.16 Lab orders for a 6 week f/u

## 2014-05-25 ENCOUNTER — Other Ambulatory Visit: Payer: Self-pay

## 2014-05-26 ENCOUNTER — Telehealth: Payer: Self-pay | Admitting: Dietician

## 2014-05-26 NOTE — Telephone Encounter (Signed)
Patient called [facility] this morning asking to speak w/ Jeanie Sewer who was out on vacation & will not be back in the office until Tuesday, 4/19. The patient advised that she was having a "crisis" & needed to talk to [facility] as she is an "emotional eater" & "just cannot stop eating." I explained to her that Rise Paganini was out of the office & not on call. I asked if she had another provider/PCP that she could call to talk to & she said no. She was very tearful. I spoke with Lilly Cove as I wanted to know if there was someone else that she could speak to/someone else that I could refer her to. Kathlee Nations advised that I suggest that she call [facility] Crisis Line that is available 24/7. Called the patient back & provided her with the Crisis line #. She was still tearful but took the # advising she would give them a call. Reminded her that Rise Paganini would be back next week.

## 2014-06-01 ENCOUNTER — Ambulatory Visit (INDEPENDENT_AMBULATORY_CARE_PROVIDER_SITE_OTHER): Payer: Medicare PPO | Admitting: Psychology

## 2014-06-01 ENCOUNTER — Telehealth: Payer: Self-pay | Admitting: *Deleted

## 2014-06-01 DIAGNOSIS — F332 Major depressive disorder, recurrent severe without psychotic features: Secondary | ICD-10-CM | POA: Diagnosis not present

## 2014-06-01 NOTE — Telephone Encounter (Signed)
Noted  

## 2014-06-01 NOTE — Telephone Encounter (Signed)
Pt called states she does not feel well.  States her blood sugar was 157 at 9:05am before meals.  She states she feels nauseated.  She further states she started Trulicity last week.  I advised pt to call her PCP however she wanted to send a message to Dr Howell Rucks and Joycelyn Schmid.

## 2014-06-01 NOTE — Telephone Encounter (Signed)
Trulicity can cause some nausea. If she can keep things down ( no vomiting/severe upper abdominal pain)- then would recommend continuing the medication.  The nausea should get better with the next few injections.  In the meantime, please decrease amount of food that you are eating, decrease greasiness of foods,  Try ginger tea to reduce nausea. She could see her PCP too since "she is not feeling well"

## 2014-06-01 NOTE — Telephone Encounter (Signed)
Spoke to patient to get an update on symptoms. Patient stated that she has a headache, stomach ache, diarreha and feels nausea Patient stated she was Dx with a kidney infection yesterday by her GYN provider. She also stated that she got really upset last week and began binge eating. Patient stated that she does not believe these symptoms are releated to her trulicity. She just wanted to let us know what was going on. She has an appointment with her psychiatrist this afternoon.

## 2014-06-07 ENCOUNTER — Other Ambulatory Visit: Payer: Self-pay | Admitting: Family Medicine

## 2014-06-07 NOTE — Telephone Encounter (Signed)
Refill sent to pharmacy electronically as instructed.

## 2014-06-07 NOTE — Telephone Encounter (Signed)
Please refill # 90 with 3 ref

## 2014-06-07 NOTE — Telephone Encounter (Signed)
Received refill request electronically from pharmacy. Last refill 06/07/13 #90/3, last office visit 04/1614. Is it okay to refill medication?

## 2014-06-10 ENCOUNTER — Encounter: Payer: Self-pay | Admitting: Podiatry

## 2014-06-10 ENCOUNTER — Ambulatory Visit (INDEPENDENT_AMBULATORY_CARE_PROVIDER_SITE_OTHER): Payer: Medicare PPO | Admitting: Podiatry

## 2014-06-10 ENCOUNTER — Other Ambulatory Visit: Payer: Self-pay | Admitting: *Deleted

## 2014-06-10 VITALS — Ht 64.0 in | Wt 330.0 lb

## 2014-06-10 DIAGNOSIS — B351 Tinea unguium: Secondary | ICD-10-CM | POA: Diagnosis not present

## 2014-06-10 DIAGNOSIS — M79676 Pain in unspecified toe(s): Secondary | ICD-10-CM

## 2014-06-11 ENCOUNTER — Other Ambulatory Visit: Payer: Self-pay | Admitting: Obstetrics and Gynecology

## 2014-06-11 ENCOUNTER — Ambulatory Visit: Payer: Medicare PPO

## 2014-06-11 DIAGNOSIS — N649 Disorder of breast, unspecified: Secondary | ICD-10-CM

## 2014-06-14 NOTE — Progress Notes (Signed)
Patient ID: ALENCIA GORDON, female   DOB: 12/08/1965, 49 y.o.   MRN: [id number]  Subjective: 49 y.o.-year-old female returns the office today for painful, elongated, thickened toenails. Denies any redness or drainage around the nails. Denies any acute changes since last appointment and no new complaints today. Denies any systemic complaints such as fevers, chills, nausea, vomiting.   Objective: AAO 3, NAD DP/PT pulses palpable, CRT less than 3 seconds Protective sensation decreased with Simms Weinstein monofilament, Achilles tendon reflex intact.  Nails hypertrophic, dystrophic, elongated, brittle, discolored 10. There is tenderness overlying the nails 1-5 bilaterally. There is no surrounding erythema or drainage along the nail sites. No open lesions or pre-ulcerative lesions are identified. Hammertoe contractures are present bilaterally with prominent metatarsal heads and atrophy of the fat pad.  No other areas of tenderness bilateral lower extremities. No overlying edema, erythema, increased warmth. No pain with calf compression, swelling, warmth, erythema.  Assessment: Patient presents with symptomatic onychomycosis  Plan: -Treatment options including alternatives, risks, complications were discussed -Nails sharply debrided 10 without complication/bleeding. -Discussed daily foot inspection. If there are any changes, to call the office immediately.  -Follow-up in 3 months or sooner if any problems are to arise. In the meantime, encouraged to call the office with any questions, concerns, changes symptoms.

## 2014-06-17 ENCOUNTER — Encounter: Payer: Medicare PPO | Attending: Family Medicine | Admitting: *Deleted

## 2014-06-17 VITALS — Ht 64.0 in | Wt 337.8 lb

## 2014-06-17 DIAGNOSIS — Z713 Dietary counseling and surveillance: Secondary | ICD-10-CM | POA: Insufficient documentation

## 2014-06-17 DIAGNOSIS — E1140 Type 2 diabetes mellitus with diabetic neuropathy, unspecified: Secondary | ICD-10-CM | POA: Insufficient documentation

## 2014-06-17 DIAGNOSIS — Z6841 Body Mass Index (BMI) 40.0 and over, adult: Secondary | ICD-10-CM | POA: Diagnosis not present

## 2014-06-17 NOTE — Progress Notes (Signed)
Medical Nutrition Therapy:  Appt start time: 1400 end time:  4665.  Assessment:  Primary concerns today: 06/17/14.  She returns for follow up visit for Diabetes and Obesity. She has been very upset since her last MD visit as she was told she had gained all of her weight back when in reality she had continued to lose another 4 pounds. She has been eating fast foods in the meantime out of spite and disappointment. She states she has binged too. She does state there have been some family gatherings with salad bar meals and she brought her own healthier food choices. She is still trying new recipes and would like some more to work with. She continues to be pain with severe headache and jaw pain. She is now working with an Musician in [place], who is suggesting additional medications to help control her BG better. She is still very concerned over a letter she received that indicated there were some discrepancies with her taxes and she is anxious about what that might mean.    Preferred Learning Style:  Visual  Hands on   Learning Readiness:   Contemplating  Ready  Change in progress  MEDICATIONS: see list, diabetes medication is Glipizide XL   DIETARY INTAKE:  24-hr recall:  B ( AM): has problem with food intake in the AM, and she sleeps late  Snk ( AM): no  L ( PM): depends on what is in the house. May go to [employer] and get a burger, drink such as sweet tea and fries or other food (she thinks in 3's) Snk ( PM): grazes throughout the day D ( PM): She may cook 2-3 nights a week. Then she may not cook for a week or so. She cooks for 4 servings so she can save for a later day. Snk ( PM): no Beverages: soda, water  Usual physical activity: none at this time. She would prefer inside versus outside activities, she is not at all interested in water activities  Estimated energy needs: 1600 calories 180 g carbohydrates 120 g protein 44 g fat  Progress Towards Goal(s):  In  progress.   Nutritional Diagnosis:  NI-1.5 Excessive energy intake As related to activity level.  As evidenced by BMI of 59.4 now down to 58.4    Intervention:  Nutrition counseling and diabetes education continued. Spent most of visit listening to her concerns and encouraging her to follow up with her therapist regarding her depression. Helped her acknowledge the successes she has made with her improved eating habits overall and to resume those now that she is aware of the continued weight loss that she was truly having.  I again commended her on trying new recipes she is finding in [place].   Plan:  Aim for 3 Carb Choices per meal (45 grams) +/- 1 either way  Aim for 0-1 Carbs per snack if hungry  Include protein in moderation with your meals and snacks Consider reading food labels for Total Carbohydrate of foods Continue looking for new recipes to try  Ask the new endocrinologist about "Gastroparesis" Try Almond milk on your cereal. You may have 1 bowl of cereal a day if you'd like   Teaching Method Utilized:  Visual Auditory  Handouts given during visit include: Menu Planning Sheet  Barriers to learning/adherence to lifestyle change: multiple mental health and physical issues  Demonstrated degree of understanding via:  Teach Back   Monitoring/Evaluation:  Dietary intake, exercise, reading food labels, and body weight in  4 week(s).

## 2014-06-17 NOTE — Patient Instructions (Signed)
Plan:  Aim for 3 Carb Choices per meal (45 grams) +/- 1 either way  Aim for 0-1 Carbs per snack if hungry  Include protein in moderation with your meals and snacks Consider reading food labels for Total Carbohydrate of foods Continue looking for new recipes to try  Ask the new endocrinologist about "Gastroparesis" Try Almond milk on your cereal. You may have 1 bowl of cereal a day if you'd like

## 2014-06-17 NOTE — Progress Notes (Deleted)
Patient called [facility] this morning asking to speak w/ Jeanie Sewer who was out on vacation & will not be back in the office until Tuesday, 4/19. The patient advised that she was having a "crisis" & needed to talk to [facility] as she is an "emotional eater" & "just cannot stop eating." I explained to her that Rise Paganini was out of the office & not on call. I asked if she had another provider/PCP that she could call to talk to & she said no. She was very tearful. I spoke with Lilly C[place] as I wanted to know if there was someone else that she could speak to/someone else that I could refer her to. Kathlee Nations advised that I suggest that she call [facility] Crisis Line that is available 24/7. Called the patient back & provided her with the Crisis line #. She was still tearful but took the # advising she would give them a call. Reminded her that Rise Paganini would be back next week.

## 2014-06-21 ENCOUNTER — Other Ambulatory Visit: Payer: Self-pay | Admitting: Obstetrics and Gynecology

## 2014-06-21 ENCOUNTER — Ambulatory Visit
Admission: RE | Admit: 2014-06-21 | Discharge: 2014-06-21 | Disposition: A | Discharge status: Home / Self Care | Payer: Medicare PPO | Source: Ambulatory Visit | Attending: Obstetrics and Gynecology | Admitting: Obstetrics and Gynecology

## 2014-06-21 ENCOUNTER — Ambulatory Visit: Payer: Self-pay

## 2014-06-21 DIAGNOSIS — N649 Disorder of breast, unspecified: Secondary | ICD-10-CM

## 2014-06-21 DIAGNOSIS — R920 Mammographic microcalcification found on diagnostic imaging of breast: Secondary | ICD-10-CM | POA: Insufficient documentation

## 2014-06-21 DIAGNOSIS — R922 Inconclusive mammogram: Secondary | ICD-10-CM | POA: Diagnosis not present

## 2014-06-23 ENCOUNTER — Encounter: Payer: Self-pay | Admitting: Endocrinology

## 2014-06-23 ENCOUNTER — Ambulatory Visit (INDEPENDENT_AMBULATORY_CARE_PROVIDER_SITE_OTHER): Payer: Medicare PPO | Admitting: Endocrinology

## 2014-06-23 VITALS — BP 110/78 | HR 94 | Resp 14 | Ht 64.0 in | Wt 334.0 lb

## 2014-06-23 DIAGNOSIS — E785 Hyperlipidemia, unspecified: Secondary | ICD-10-CM

## 2014-06-23 DIAGNOSIS — Z6841 Body Mass Index (BMI) 40.0 and over, adult: Secondary | ICD-10-CM

## 2014-06-23 DIAGNOSIS — E1140 Type 2 diabetes mellitus with diabetic neuropathy, unspecified: Secondary | ICD-10-CM | POA: Diagnosis not present

## 2014-06-23 LAB — HM DIABETES FOOT EXAM: HM DIABETIC FOOT EXAM: NORMAL

## 2014-06-23 NOTE — Patient Instructions (Signed)
Continue current meds.  Labs next week. Please come back for a follow-up appointment in 1 month.

## 2014-06-23 NOTE — Progress Notes (Signed)
Pre visit review using our clinic review tool, if applicable. No additional management support is needed unless otherwise documented below in the visit note. 

## 2014-06-23 NOTE — Progress Notes (Signed)
Reason for visit-  Mackenzie Bonilla is a 49 y.o.-year-old female,  For follow up management of Type 2 diabetes, uncontrolled, with complications ( neuropathy). Associated hx Bipolar disease, PCOS, morbid obesity and bulimia. Used to see Dr Lew Dawes at [facility] in remote past for DM. Last visit with me April 2016   HPI- Patient has been diagnosed with diabetes in 2014-2015, with prior hx PCOS and PreDM. Recalls being initially on lifestyle modifications. Recently started seeing new nutritionist.  Tried  Metformin, Glipizide. she has not been on insulin before.   *Didn't tolerate metformin due to cramping, GI upset *Gets frustrated very easily. Addicted to sugars. Binges per mood changes, more if depressed. In the morning can't eat anything Then gets to taking her medications later in the day and eats and snacks through the day.  Gets freq kidney stones, urologist told to cut back on root veggies. Now feels that she is very limited with her dietary choices Trying hard to buy better grocery , cook per DM cookbook but not that successful Needs help with meal planning and sees nutr monthly.  * earlier didn't have family support to go through bariatric surgery program and also felt not to be a good candidate psych wise.  *recent stress increase, as needs breast bopisy    Pt is currently on a regimen of:  -Glipizide 5 mg daily ( restarted March 1962) -Trulicity 2.29 mg [place] weekly ( start April 2016)  * notes some bloating and constant GERD, with nausea. Can tolerate these symptoms. Since last time, had another course of cipro for UTI, still having on and off left flank pain   Last hemoglobin A1c was: Lab Results  Component Value Date   HGBA1C 8.9* 03/31/2014   HGBA1C 7.6* 12/02/2013   HGBA1C 8.0* 08/11/2013     Pt checks her sugars 2 a day . Uses AccuChek glucometer. By sugar log they are:  PREMEAL Breakfast Lunch Dinner Bedtime Overall  Glucose range: 137-153 110-191 115-155     Mean/median:        POST-MEAL PC Breakfast PC Lunch PC Dinner  Glucose range:     Mean/median:      * higher sugars with dietary discretions * not done so well with diet recently  Hypoglycemia-  No lows. Lowest sugar was n/a; she has hypoglycemia awareness at 70.   Dietary habits- eats 2 times daily. Sees nutrition. See above Exercise- started walking- sees podiatrist - q 3 months-started to form blisters on balls of feet- got Dm shoes-podiatry asked her to hold off on walking for some time due to this. Going to get stretch bands through [facility].  Weight - up again  Wt Readings from Last 3 Encounters:  06/23/14 334 lb (151.501 kg)  06/17/14 337 lb 12.8 oz (153.225 kg)  06/10/14 330 lb (149.687 kg)    Diabetes Complications-  Nephropathy- No, ? Developing Stage 2  CKD, last BUN/creatinine- GFR 65-75 Lab Results  Component Value Date   BUN 9 12/02/2013   CREATININE 1.0 12/02/2013   Lab Results  Component Value Date   GFR 65.83 12/02/2013   No results found for: MICRALBCREAT     Retinopathy- No, Last DEE was in 2016 Neuropathy- some numbness and tingling in )her feet. known neuropathy. Sees podiatry and wears DM shoes Associated history - No CAD . No prior stroke. No hypothyroidism. her last TSH was at upper end of normal Lab Results  Component Value Date   TSH 4.48 11/14/2011  Hyperlipidemia-  her last set of lipids were- Currently on fenofibrate ( just started Feb 2016- started taking med since last time).  Allergic to high dose Zocor ( muscle aches). Lab Results  Component Value Date   CHOL 203* 03/31/2014   HDL 38.10* 03/31/2014   LDLCALC 44 06/17/2009   LDLDIRECT 90.0 03/31/2014   TRIG * 03/31/2014    526.0 Triglyceride is over 400; calculations on Lipids are invalid.   CHOLHDL 5 03/31/2014    Blood Pressure/HTN- Patient's blood pressure is well controlled today on current regimen.    I have reviewed the patient's past medical history, medications and  allergies.   Current Outpatient Prescriptions on File Prior to Visit  Medication Sig Dispense Refill  . acetaminophen (TYLENOL) 500 MG tablet Take 500 mg by mouth every 6 (six) hours as needed. OTC as directed     . albuterol (PROVENTIL HFA;VENTOLIN HFA) 108 (90 BASE) MCG/ACT inhaler Inhale 2 puffs into the lungs every 4 (four) hours as needed for wheezing.    Marland Kitchen allopurinol (ZYLOPRIM) 100 MG tablet Take 1 tablet (100 mg total) by mouth daily. 30 tablet 11  . ALPRAZolam (XANAX) 1 MG tablet Take 2 mg by mouth 3 (three) times daily as needed.     Marland Kitchen azelastine (ASTELIN) 0.1 % nasal spray     . benzonatate (TESSALON) 200 MG capsule TAKE ONE CAPSULE BY MOUTH THREE TIMES DAILY AS NEEDED FOR COUGH 90 capsule 3  . buPROPion (WELLBUTRIN XL) 150 MG 24 hr tablet 450 mg daily.     . calcium carbonate (TUMS - DOSED IN MG ELEMENTAL CALCIUM) 500 MG chewable tablet Chew 1 tablet by mouth as needed.    . chlorhexidine (PERIDEX) 0.12 % solution Use as directed 15 mLs in the mouth or throat 3 (three) times daily.    . Cholecalciferol (VITAMIN D) 1000 UNITS capsule Take 1,000 Units by mouth daily.      . cloNIDine (CATAPRES) 0.2 MG tablet Take 0.2 mg by mouth at bedtime.     . cyanocobalamin (,VITAMIN B-12,) 1000 MCG/ML injection Inject 1,000 mcg into the muscle once a month. 1 mL 5  . cyclobenzaprine (FLEXERIL) 5 MG tablet TAKE 1 TABLET 3 TIMES DAILY AS NEEDED FOR MUSCLE SPASM 30 tablet 1  . dimenhyDRINATE (DRAMAMINE) 50 MG tablet Take 50 mg by mouth every 8 (eight) hours as needed.    . Dulaglutide (TRULICITY) 2.42 PN/3.6RW SOPN Inject 0.75 mg into the skin once a week. 2 mL 3  . fenofibrate (TRICOR) 48 MG tablet Take 1 tablet (48 mg total) by mouth daily. 30 tablet 11  . FLUoxetine (PROZAC) 40 MG capsule Take 80 mg by mouth daily.     . fluticasone-salmeterol (ADVAIR HFA) 115-21 MCG/ACT inhaler Inhale 2 puffs into the lungs 2 (two) times daily.    Marland Kitchen glipiZIDE (GLIPIZIDE XL) 5 MG 24 hr tablet TAKE 1 TABLET ONCE  DAILY WITH BREAKFAST 30 tablet 5  . glycopyrrolate (ROBINUL) 1 MG tablet Take 2 tablets by mouth twice daily as needed for abdominal cramping 120 tablet 5  . guaifenesin (REFENESEN 400) 400 MG TABS Take 400 mg by mouth every 4 (four) hours as needed. OTC as directed    . JUNEL FE 1/20 1-20 MG-MCG tablet Take 1 tablet by mouth daily.    . mometasone (ELOCON) 0.1 % cream Apply 1 application topically daily. Apply to affected area once daily 15 g 0  . naphazoline-glycerin (CLEAR EYES) 0.012-0.2 % SOLN Place 1-2 drops into both eyes  every 4 (four) hours as needed. OTC as directed     . norethindrone-ethinyl estradiol (GILDESS 1/20) 1-20 MG-MCG tablet Take 1 tablet by mouth daily.    Marland Kitchen nystatin (NYSTOP) 100000 UNIT/GM POWD Apply topically 2 (two) times daily as needed. Apply to affected area two times a day until clear asneeded     . omeprazole (PRILOSEC) 20 MG capsule Take 1 capsule (20 mg total) by mouth 2 (two) times daily. 60 capsule 5  . oxybutynin (DITROPAN XL) 15 MG 24 hr tablet Take 1 tablet (15 mg total) by mouth daily. 90 tablet 3  . PARoxetine Mesylate (BRISDELLE) 7.5 MG CAPS Take 7.5 mg by mouth daily.    . simvastatin (ZOCOR) 5 MG tablet Take 5 mg by mouth daily.    Marland Kitchen spironolactone (ALDACTONE) 100 MG tablet     . spironolactone (ALDACTONE) 50 MG tablet Take 1 tablet (50 mg total) by mouth daily. 30 tablet 3  . talc (ZEASORB) powder Apply topically as needed. 240 g 3  . traMADol (ULTRAM) 50 MG tablet TAKE ONE TABLET EVERY EIGHT HOURS AS NEEDED FOR PAIN 60 tablet 1  . traZODone (DESYREL) 150 MG tablet     . traZODone (DESYREL) 50 MG tablet Take 6 tablets  By mouth at bedtime. But can take up to 9 tablets daily      No current facility-administered medications on file prior to visit.   Allergies  Allergen Reactions  . Cephalexin   . Codeine     REACTION: ? reaction  . Duloxetine   . Hydrocodone     REACTION: ? reaction  . Levaquin [Levofloxacin] Nausea Only  . Metformin And  Related Diarrhea    GI side effects   . Norelgestromin-Eth Estradiol   . Prednisone     Tolerates intraarticular depo-medrol.  . Quetiapine   . Sertraline Hcl     REACTION: vivid dreams  . Triazolam     REACTION: ? reaction  . Zaleplon   . Zocor [Simvastatin - High Dose]     achey  . Zolpidem Tartrate     REACTION: ? reaction    Review of Systems- [ x ]  Complains of    [  ]  denies [ x ] Recent weight change [ x ]  Fatigue [  ] polydipsia [  ] polyuria [  ]  nocturia [  ]  vision difficulty [  ] chest pain [  ] shortness of breath [x  ] leg swelling [  ] cough [ x ] nausea/vomiting [  ] diarrhea [ x ] constipation [ x ] abdominal pain ( left flank on and off feels that it could be related to her kidneys) [  ]  tingling/numbness in extremities [  ]  concern with feet ( wounds/sores)    PE: BP 110/78 mmHg  Pulse 94  Resp 14  Ht 5\' 4"  (1.626 m)  Wt 334 lb (151.501 kg)  BMI 57.30 kg/m2  SpO2 94% Wt Readings from Last 3 Encounters:  06/23/14 334 lb (151.501 kg)  06/17/14 337 lb 12.8 oz (153.225 kg)  06/10/14 330 lb (149.687 kg)       EXTREMITIES:     There is 1+ edema. 1+ DP pulses  NEUROLOGICAL:     Grossly intact.            Diabetic foot exam done with shoes and socks removed: Normal Monofilament testing bilaterally. No deformities of toes.  Nails  Not dystrophic. Skin normal color.  No open wounds. Dry scaly skin.      ASSESSMENT AND PLAN: Problem List Items Addressed This Visit      Endocrine   Type 2 diabetes, controlled, with neuropathy    Congratulated her on recent lifestyle efforts. Her sugars are looking good since last time on the log.  She still struggles with binge eating and we discussed the role of GLP-1 therapy in appetite control. She was going to cut back the freq of her therapy sessions in order to pay for the medication.   Continue to follow up with nutrition. Discussed checking sugars 2 x daily.  Continue Glipizide for now at 5 mg daily. If  start to see lows, then notify me for further changes.  She is having mild intolerance from the Trulicity. Asked her to avoid binge eating and assess symptoms for the next few weeks -  If does not tolerate, then will discuss about switching to alternate. Continue current Trulicity at 6.43 mg [place] weekly.   Labs next week.             Relevant Orders   Comprehensive metabolic panel   TSH   T4, free   Hemoglobin A1c   Microalbumin / creatinine urine ratio     Other   Hyperlipidemia LDL goal <100    Recently started on Fenofibrate about 1 month ago.         Relevant Orders   Comprehensive metabolic panel   TSH   T4, free   Hemoglobin A1c   Microalbumin / creatinine urine ratio   Morbid obesity with body mass index of 60.0-69.9 in adult - Primary    Continue to work on portion control, not skipping meals and continue nutrition follow up.   Congratulated her on her dietary effort and asked her to keep working on this.  We discussed at length regarding binge eating and avoiding too restrictive of a diet.         Relevant Orders   Comprehensive metabolic panel   TSH   T4, free   Hemoglobin A1c   Microalbumin / creatinine urine ratio        - Return to clinic in 4 weeks with sugar log/meter. She will visit with her PCP regarding the flank pain, that is episodic. Recently completed extended course of cipro.    Mackenzie Bonilla [facility] 06/24/2014 1:31 PM

## 2014-06-24 ENCOUNTER — Other Ambulatory Visit: Payer: Self-pay | Admitting: Obstetrics and Gynecology

## 2014-06-24 DIAGNOSIS — R921 Mammographic calcification found on diagnostic imaging of breast: Secondary | ICD-10-CM

## 2014-06-24 NOTE — Assessment & Plan Note (Signed)
Congratulated her on recent lifestyle efforts. Her sugars are looking good since last time on the log.  She still struggles with binge eating and we discussed the role of GLP-1 therapy in appetite control. She was going to cut back the freq of her therapy sessions in order to pay for the medication.   Continue to follow up with nutrition. Discussed checking sugars 2 x daily.  Continue Glipizide for now at 5 mg daily. If start to see lows, then notify me for further changes.  She is having mild intolerance from the Trulicity. Asked her to avoid binge eating and assess symptoms for the next few weeks -  If does not tolerate, then will discuss about switching to alternate. Continue current Trulicity at 5.44 mg [place] weekly.   Labs next week.

## 2014-06-24 NOTE — Assessment & Plan Note (Signed)
Continue to work on portion control, not skipping meals and continue nutrition follow up.   Congratulated her on her dietary effort and asked her to keep working on this.  We discussed at length regarding binge eating and avoiding too restrictive of a diet.

## 2014-06-24 NOTE — Assessment & Plan Note (Signed)
Recently started on Fenofibrate about 1 month ago.

## 2014-06-25 ENCOUNTER — Ambulatory Visit
Admission: RE | Admit: 2014-06-25 | Discharge: 2014-06-25 | Disposition: A | Discharge status: Home / Self Care | Payer: Medicare PPO | Source: Ambulatory Visit

## 2014-06-25 ENCOUNTER — Ambulatory Visit
Admission: RE | Admit: 2014-06-25 | Discharge: 2014-06-25 | Disposition: A | Discharge status: Home / Self Care | Payer: Medicare PPO | Source: Ambulatory Visit | Attending: Obstetrics and Gynecology | Admitting: Obstetrics and Gynecology

## 2014-06-25 ENCOUNTER — Other Ambulatory Visit: Payer: Self-pay | Admitting: Obstetrics and Gynecology

## 2014-06-25 ENCOUNTER — Other Ambulatory Visit: Payer: Self-pay

## 2014-06-25 DIAGNOSIS — R921 Mammographic calcification found on diagnostic imaging of breast: Secondary | ICD-10-CM

## 2014-06-25 DIAGNOSIS — N6011 Diffuse cystic mastopathy of right breast: Secondary | ICD-10-CM | POA: Diagnosis not present

## 2014-06-25 DIAGNOSIS — N649 Disorder of breast, unspecified: Secondary | ICD-10-CM

## 2014-06-25 DIAGNOSIS — N62 Hypertrophy of breast: Secondary | ICD-10-CM | POA: Insufficient documentation

## 2014-06-25 DIAGNOSIS — N63 Unspecified lump in breast: Secondary | ICD-10-CM | POA: Diagnosis present

## 2014-06-28 LAB — SURGICAL PATHOLOGY

## 2014-06-29 ENCOUNTER — Other Ambulatory Visit (INDEPENDENT_AMBULATORY_CARE_PROVIDER_SITE_OTHER): Payer: Medicare PPO

## 2014-06-29 ENCOUNTER — Other Ambulatory Visit: Payer: Medicare PPO

## 2014-06-29 ENCOUNTER — Ambulatory Visit: Payer: Medicare PPO | Admitting: Psychology

## 2014-06-29 DIAGNOSIS — E1140 Type 2 diabetes mellitus with diabetic neuropathy, unspecified: Secondary | ICD-10-CM | POA: Diagnosis not present

## 2014-06-29 DIAGNOSIS — Z6841 Body Mass Index (BMI) 40.0 and over, adult: Secondary | ICD-10-CM | POA: Diagnosis not present

## 2014-06-29 DIAGNOSIS — E785 Hyperlipidemia, unspecified: Secondary | ICD-10-CM

## 2014-06-29 LAB — COMPREHENSIVE METABOLIC PANEL
ALT: 9 U/L (ref 0–35)
AST: 10 U/L (ref 0–37)
Albumin: 3.7 g/dL (ref 3.5–5.2)
Alkaline Phosphatase: 58 U/L (ref 39–117)
BUN: 9 mg/dL (ref 6–23)
CALCIUM: 9.3 mg/dL (ref 8.4–10.5)
CO2: 27 mEq/L (ref 19–32)
CREATININE: 0.79 mg/dL (ref 0.40–1.20)
Chloride: 105 mEq/L (ref 96–112)
GFR: 82.23 mL/min (ref >60.00)
Glucose, Bld: 134 mg/dL — ABNORMAL HIGH (ref 70–99)
POTASSIUM: 4.4 meq/L (ref 3.5–5.1)
Sodium: 139 mEq/L (ref 135–145)
TOTAL PROTEIN: 6.2 g/dL (ref 6.0–8.3)
Total Bilirubin: 0.3 mg/dL (ref 0.2–1.2)

## 2014-06-29 LAB — MICROALBUMIN / CREATININE URINE RATIO
CREATININE, U: 146.5 mg/dL
Microalb Creat Ratio: 6.5 mg/g (ref 0.0–30.0)
Microalb, Ur: 9.5 mg/dL — ABNORMAL HIGH (ref 0.0–1.9)

## 2014-06-29 LAB — T4, FREE: Free T4: 0.72 ng/dL (ref 0.60–1.60)

## 2014-06-29 LAB — HEMOGLOBIN A1C: Hgb A1c MFr Bld: 7.3 % — ABNORMAL HIGH (ref 4.6–6.5)

## 2014-06-29 LAB — TSH: TSH: 2.19 u[IU]/mL (ref 0.35–4.50)

## 2014-07-15 ENCOUNTER — Ambulatory Visit: Payer: Self-pay | Admitting: *Deleted

## 2014-07-16 ENCOUNTER — Ambulatory Visit (INDEPENDENT_AMBULATORY_CARE_PROVIDER_SITE_OTHER): Payer: Medicare PPO | Admitting: Podiatry

## 2014-07-16 DIAGNOSIS — M79673 Pain in unspecified foot: Secondary | ICD-10-CM

## 2014-07-16 DIAGNOSIS — B351 Tinea unguium: Secondary | ICD-10-CM | POA: Diagnosis not present

## 2014-07-16 DIAGNOSIS — E1142 Type 2 diabetes mellitus with diabetic polyneuropathy: Secondary | ICD-10-CM | POA: Diagnosis not present

## 2014-07-16 DIAGNOSIS — E1151 Type 2 diabetes mellitus with diabetic peripheral angiopathy without gangrene: Secondary | ICD-10-CM | POA: Diagnosis not present

## 2014-07-16 MED ORDER — GABAPENTIN 100 MG PO CAPS: 100.0000 mg | ORAL_CAPSULE | Freq: Two times a day (BID) | ORAL | Status: DC

## 2014-07-17 NOTE — Progress Notes (Signed)
She presents today complaining of pain to the ball of her foot bilaterally while walking sitting and at bedtime. She states that the pain ranges from pressure numbness and tingling to sharp shooting pain and burning. She states she continues to see her endocrinologist and her sugars continue to improve stating that her A1c is around a 7.6. She denies any other changes in her past medical history medications or allergies.  Objective: Vital signs are stable she is alert and oriented 3 she seems to be sad or depressed today and is crying very easily. Pulses are palpable bilateral. Neurologic sensorium is intact per Semmes-Weinstein monofilament. She has some reproducible pain on palpation of the forefoot and the toes bilaterally. She has flexible hammertoe deformities left greater than right. Edematous legs and feet with small petechiae scattered dorsally as well as lichen simplex chronicus to the dorsum of the toes from either shoe gear or some other irritation. Nails are elongated and dystrophic. They are painful on debridement.  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 Nails are elongated  Plan: We discussed the etiology pathology conservative versus surgical therapies. I expressed to her at this point to diagnose small fiber disease we would actually have to take a punch biopsy and send for epidermal nerve fiber counts. However I suggested because of our uncertainty to wound healing at this point we try gabapentin 100 mg twice daily to see if this makes any difference for her at all. I will follow-up with her in 1 month for reevaluation and further titration of the gabapentin if tolerated. Nursing staff also debrided her nails for her today.

## 2014-07-21 ENCOUNTER — Ambulatory Visit: Payer: Medicare PPO | Admitting: Endocrinology

## 2014-07-22 ENCOUNTER — Ambulatory Visit: Payer: Self-pay | Admitting: *Deleted

## 2014-07-27 ENCOUNTER — Ambulatory Visit: Payer: Medicare PPO | Admitting: Psychology

## 2014-07-28 ENCOUNTER — Other Ambulatory Visit: Payer: Self-pay | Admitting: General Surgery

## 2014-07-28 DIAGNOSIS — N63 Unspecified lump in unspecified breast: Secondary | ICD-10-CM

## 2014-07-29 ENCOUNTER — Ambulatory Visit
Admission: RE | Admit: 2014-07-29 | Discharge: 2014-07-29 | Disposition: A | Discharge status: Home / Self Care | Payer: Medicare PPO | Source: Ambulatory Visit | Attending: General Surgery | Admitting: General Surgery

## 2014-07-29 ENCOUNTER — Inpatient Hospital Stay (HOSPITAL_COMMUNITY): Admission: RE | Admit: 2014-07-29 | Payer: Self-pay | Source: Ambulatory Visit

## 2014-07-29 DIAGNOSIS — N6489 Other specified disorders of breast: Secondary | ICD-10-CM | POA: Diagnosis not present

## 2014-07-29 DIAGNOSIS — N63 Unspecified lump in unspecified breast: Secondary | ICD-10-CM

## 2014-07-30 ENCOUNTER — Encounter (HOSPITAL_COMMUNITY): Payer: Self-pay

## 2014-07-30 ENCOUNTER — Encounter (HOSPITAL_COMMUNITY)
Admission: RE | Admit: 2014-07-30 | Discharge: 2014-07-30 | Disposition: A | Discharge status: Home / Self Care | Payer: Medicare PPO | Source: Ambulatory Visit | Attending: General Surgery | Admitting: General Surgery

## 2014-07-30 ENCOUNTER — Other Ambulatory Visit (HOSPITAL_COMMUNITY): Payer: Self-pay

## 2014-07-30 DIAGNOSIS — E119 Type 2 diabetes mellitus without complications: Secondary | ICD-10-CM | POA: Diagnosis not present

## 2014-07-30 DIAGNOSIS — F329 Major depressive disorder, single episode, unspecified: Secondary | ICD-10-CM | POA: Diagnosis not present

## 2014-07-30 DIAGNOSIS — Z800 Family history of malignant neoplasm of digestive organs: Secondary | ICD-10-CM | POA: Diagnosis not present

## 2014-07-30 DIAGNOSIS — Z6841 Body Mass Index (BMI) 40.0 and over, adult: Secondary | ICD-10-CM | POA: Diagnosis not present

## 2014-07-30 DIAGNOSIS — K219 Gastro-esophageal reflux disease without esophagitis: Secondary | ICD-10-CM | POA: Diagnosis not present

## 2014-07-30 DIAGNOSIS — N63 Unspecified lump in breast: Secondary | ICD-10-CM | POA: Diagnosis present

## 2014-07-30 DIAGNOSIS — Z833 Family history of diabetes mellitus: Secondary | ICD-10-CM | POA: Diagnosis not present

## 2014-07-30 DIAGNOSIS — Z87442 Personal history of urinary calculi: Secondary | ICD-10-CM | POA: Diagnosis not present

## 2014-07-30 DIAGNOSIS — Z888 Allergy status to other drugs, medicaments and biological substances status: Secondary | ICD-10-CM | POA: Diagnosis not present

## 2014-07-30 DIAGNOSIS — M1990 Unspecified osteoarthritis, unspecified site: Secondary | ICD-10-CM | POA: Diagnosis not present

## 2014-07-30 DIAGNOSIS — N6091 Unspecified benign mammary dysplasia of right breast: Secondary | ICD-10-CM | POA: Diagnosis not present

## 2014-07-30 DIAGNOSIS — M797 Fibromyalgia: Secondary | ICD-10-CM | POA: Diagnosis not present

## 2014-07-30 DIAGNOSIS — F419 Anxiety disorder, unspecified: Secondary | ICD-10-CM | POA: Diagnosis not present

## 2014-07-30 DIAGNOSIS — Z8249 Family history of ischemic heart disease and other diseases of the circulatory system: Secondary | ICD-10-CM | POA: Diagnosis not present

## 2014-07-30 DIAGNOSIS — E785 Hyperlipidemia, unspecified: Secondary | ICD-10-CM | POA: Diagnosis not present

## 2014-07-30 DIAGNOSIS — J45909 Unspecified asthma, uncomplicated: Secondary | ICD-10-CM | POA: Diagnosis not present

## 2014-07-30 HISTORY — DX: Urinary tract infection, site not specified: N39.0

## 2014-07-30 HISTORY — DX: Calculus of kidney: N20.0

## 2014-07-30 HISTORY — DX: Fibromyalgia: M79.7

## 2014-07-30 HISTORY — DX: Anemia, unspecified: D64.9

## 2014-07-30 HISTORY — DX: Claustrophobia: F40.240

## 2014-07-30 HISTORY — DX: Hyperuricosuria: R82.993

## 2014-07-30 HISTORY — DX: Reserved for inherently not codable concepts without codable children: IMO0001

## 2014-07-30 HISTORY — DX: Dislocation of jaw, unspecified side, initial encounter: S03.00XA

## 2014-07-30 HISTORY — DX: Unspecified asthma, uncomplicated: J45.909

## 2014-07-30 LAB — GLUCOSE, CAPILLARY: GLUCOSE-CAPILLARY: 153 mg/dL — AB (ref 65–99)

## 2014-07-30 LAB — CBC WITH DIFFERENTIAL/PLATELET
BASOS PCT: 0 % (ref 0–1)
Basophils Absolute: 0 10*3/uL (ref 0.0–0.1)
EOS ABS: 0.2 10*3/uL (ref 0.0–0.7)
Eosinophils Relative: 2 % (ref 0–5)
HCT: 42 % (ref 36.0–46.0)
HEMOGLOBIN: 13.7 g/dL (ref 12.0–15.0)
Lymphocytes Relative: 24 % (ref 12–46)
Lymphs Abs: 1.7 10*3/uL (ref 0.7–4.0)
MCH: 30.4 pg (ref 26.0–34.0)
MCHC: 32.6 g/dL (ref 30.0–36.0)
MCV: 93.3 fL (ref 78.0–100.0)
MONO ABS: 0.5 10*3/uL (ref 0.1–1.0)
MONOS PCT: 7 % (ref 3–12)
Neutro Abs: 4.8 10*3/uL (ref 1.7–7.7)
Neutrophils Relative %: 67 % (ref 43–77)
Platelets: 254 10*3/uL (ref 150–400)
RBC: 4.5 MIL/uL (ref 3.87–5.11)
RDW: 13.5 % (ref 11.5–15.5)
WBC: 7.3 10*3/uL (ref 4.0–10.5)

## 2014-07-30 LAB — BASIC METABOLIC PANEL
Anion gap: 9 (ref 5–15)
BUN: 10 mg/dL (ref 6–20)
CALCIUM: 9.7 mg/dL (ref 8.9–10.3)
CO2: 25 mmol/L (ref 22–32)
CREATININE: 0.82 mg/dL (ref 0.44–1.00)
Chloride: 106 mmol/L (ref 101–111)
GFR calc Af Amer: >60 mL/min (ref >60)
GFR calc non Af Amer: >60 mL/min (ref >60)
Glucose, Bld: 131 mg/dL — ABNORMAL HIGH (ref 65–99)
Potassium: 4.3 mmol/L (ref 3.5–5.1)
SODIUM: 140 mmol/L (ref 135–145)

## 2014-07-30 NOTE — Pre-Procedure Instructions (Signed)
MCKYNZI CAMMON  07/30/2014      [place], [place] [address] [place] [address] Phone: [phone] Fax: [phone]    Your procedure is scheduled on June 20  Report to [facility] at 1015 A.M.  Call this number if you have problems the morning of surgery:  [phone]   Remember:  Do not eat food or drink liquids after midnight.  Take these medicines the morning of surgery with A SIP OF WATER tylenol if needed, albuterol inhaler if needed, allopurinol, xanax is needed, astelin nasal spray if needed, Wellbutrin, Flexeril, if needed, Prozac, Neurontin, Prilosec, oxybutynin, ultram if needed  Stop taking Aspirin, Ibuprofen, Aleve, BC's, Goody's, Herbal medications, Fish Oil   Do not wear jewelry, make-up or nail polish.  Do not wear lotions, powders, or perfumes.  You may wear deodorant.  Do not shave 48 hours prior to surgery.  Men may shave face and neck.  Do not bring valuables to the hospital.  [facility] is not responsible for any belongings or valuables.  Contacts, dentures or bridgework may not be worn into surgery.  Leave your suitcase in the car.  After surgery it may be brought to your room.  For patients admitted to the hospital, discharge time will be determined by your treatment team.  Patients discharged the day of surgery will not be allowed to drive home.    Special instructions:  [facility] - Preparing for Surgery  Before surgery, you can play an important role.  Because skin is not sterile, your skin needs to be as free of germs as possible.  You can reduce the number of germs on you skin by washing with CHG (chlorahexidine gluconate) soap before surgery.  CHG is an antiseptic cleaner which kills germs and bonds with the skin to continue killing germs even after washing.  Please DO NOT use if you have an allergy to CHG or antibacterial soaps.  If your skin becomes reddened/irritated  stop using the CHG and inform your nurse when you arrive at Short Stay.  Do not shave (including legs and underarms) for at least 48 hours prior to the first CHG shower.  You may shave your face.  Please follow these instructions carefully:   1.  Shower with CHG Soap the night before surgery and the     morning of Surgery.  2.  If you choose to wash your hair, wash your hair first as usual with your   normal shampoo.  3.  After you shampoo, rinse your hair and body thoroughly to remove the      Shampoo.  4.  Use CHG as you would any other liquid soap.  You can apply chg directly   to the skin and wash gently with scrungie or a clean washcloth.  5.  Apply the CHG Soap to your body ONLY FROM THE NECK DOWN.        Do not use on open wounds or open sores.  Avoid contact with your eyes,  ears, mouth and genitals (private parts).  Wash genitals (private parts) with your normal soap.  6.  Wash thoroughly, paying special attention to the area where your surgery  will be performed.  7.  Thoroughly rinse your body with warm water from the neck down.  8.  DO NOT shower/wash with your normal soap after using and rinsing off the CHG Soap.  9.  Pat yourself dry with a  clean towel.            10.  Wear clean pajamas.            11.  Place clean sheets on your bed the night of your first shower and do not sleep with pets.  Day of Surgery  Do not apply any lotions/deoderants the morning of surgery.  Please wear clean clothes to the hospital/surgery center.     Please read over the following fact sheets that you were given. Pain Booklet, Coughing and Deep Breathing and Surgical Site Infection Prevention

## 2014-07-30 NOTE — Progress Notes (Signed)
Anesthesia Chart Review:  Pt is 49 year old female scheduled for R breast lumpectomy with radioactive seed localization on 08/02/2014 with Dr. Donne Hazel.   PMH includes: DM, hyperlipidemia, tachycardia, fatty liver, asthma, anemia. Never smoker. BMI 58.  Medications include: dulaglutide, glipizide, spironolactone  Preoperative labs reviewed.    EKG 07/30/2014: NSR. Possible Left atrial enlargement. Left posterior fascicular block. Possible Anterior infarct, age undetermined. Appears unchanged when compared to EKGs dating back to 01/21/2009.  Echo 01/25/2009:  - Left ventricle: LV not well seen. Overall EF appears normal - Impressions: Extremely limited study due to poor image quality  If no changes, I anticipate pt can proceed with surgery as scheduled.   Willeen Cass, FNP-BC [facility] Stay Surgical [facility]/Anesthesiology Phone: [phone] 07/30/2014 4:15 PM

## 2014-07-30 NOTE — Progress Notes (Signed)
PCP is Dr Glori Bickers Denies seeing a cardiologist. Denies having a stress test, echo, or card cath. Denies having a recent CXR, or EKG Reports fasting cbg's run in the 120's.

## 2014-07-31 ENCOUNTER — Other Ambulatory Visit: Payer: Self-pay | Admitting: Family Medicine

## 2014-07-31 LAB — HEMOGLOBIN A1C
Hgb A1c MFr Bld: 7.3 % — ABNORMAL HIGH (ref 4.8–5.6)
MEAN PLASMA GLUCOSE: 163 mg/dL

## 2014-08-02 ENCOUNTER — Ambulatory Visit (HOSPITAL_COMMUNITY)
Admission: RE | Admit: 2014-08-02 | Discharge: 2014-08-02 | Disposition: A | Discharge status: Home / Self Care | Payer: Medicare PPO | Source: Ambulatory Visit | Attending: General Surgery | Admitting: General Surgery

## 2014-08-02 ENCOUNTER — Ambulatory Visit
Admission: RE | Admit: 2014-08-02 | Discharge: 2014-08-02 | Disposition: A | Discharge status: Home / Self Care | Payer: Medicare PPO | Source: Ambulatory Visit | Attending: General Surgery | Admitting: General Surgery

## 2014-08-02 ENCOUNTER — Encounter (HOSPITAL_COMMUNITY): Payer: Self-pay | Admitting: *Deleted

## 2014-08-02 ENCOUNTER — Ambulatory Visit (HOSPITAL_COMMUNITY): Payer: Medicare PPO | Admitting: Emergency Medicine

## 2014-08-02 ENCOUNTER — Encounter (HOSPITAL_COMMUNITY)
Admission: RE | Disposition: A | Discharge status: Home / Self Care | Payer: Self-pay | Source: Ambulatory Visit | Attending: General Surgery

## 2014-08-02 ENCOUNTER — Ambulatory Visit (HOSPITAL_COMMUNITY): Payer: Medicare PPO | Admitting: Certified Registered"

## 2014-08-02 DIAGNOSIS — F419 Anxiety disorder, unspecified: Secondary | ICD-10-CM | POA: Insufficient documentation

## 2014-08-02 DIAGNOSIS — Z8249 Family history of ischemic heart disease and other diseases of the circulatory system: Secondary | ICD-10-CM | POA: Insufficient documentation

## 2014-08-02 DIAGNOSIS — K219 Gastro-esophageal reflux disease without esophagitis: Secondary | ICD-10-CM | POA: Insufficient documentation

## 2014-08-02 DIAGNOSIS — E785 Hyperlipidemia, unspecified: Secondary | ICD-10-CM | POA: Diagnosis not present

## 2014-08-02 DIAGNOSIS — J45909 Unspecified asthma, uncomplicated: Secondary | ICD-10-CM | POA: Diagnosis not present

## 2014-08-02 DIAGNOSIS — F329 Major depressive disorder, single episode, unspecified: Secondary | ICD-10-CM | POA: Insufficient documentation

## 2014-08-02 DIAGNOSIS — R921 Mammographic calcification found on diagnostic imaging of breast: Secondary | ICD-10-CM | POA: Diagnosis not present

## 2014-08-02 DIAGNOSIS — E119 Type 2 diabetes mellitus without complications: Secondary | ICD-10-CM | POA: Diagnosis not present

## 2014-08-02 DIAGNOSIS — Z888 Allergy status to other drugs, medicaments and biological substances status: Secondary | ICD-10-CM | POA: Insufficient documentation

## 2014-08-02 DIAGNOSIS — N6091 Unspecified benign mammary dysplasia of right breast: Secondary | ICD-10-CM | POA: Diagnosis not present

## 2014-08-02 DIAGNOSIS — Z6841 Body Mass Index (BMI) 40.0 and over, adult: Secondary | ICD-10-CM | POA: Insufficient documentation

## 2014-08-02 DIAGNOSIS — M797 Fibromyalgia: Secondary | ICD-10-CM | POA: Insufficient documentation

## 2014-08-02 DIAGNOSIS — Z87442 Personal history of urinary calculi: Secondary | ICD-10-CM | POA: Insufficient documentation

## 2014-08-02 DIAGNOSIS — N63 Unspecified lump in unspecified breast: Secondary | ICD-10-CM

## 2014-08-02 DIAGNOSIS — N6489 Other specified disorders of breast: Secondary | ICD-10-CM | POA: Diagnosis not present

## 2014-08-02 DIAGNOSIS — N6011 Diffuse cystic mastopathy of right breast: Secondary | ICD-10-CM | POA: Diagnosis not present

## 2014-08-02 DIAGNOSIS — M1990 Unspecified osteoarthritis, unspecified site: Secondary | ICD-10-CM | POA: Insufficient documentation

## 2014-08-02 DIAGNOSIS — Z800 Family history of malignant neoplasm of digestive organs: Secondary | ICD-10-CM | POA: Insufficient documentation

## 2014-08-02 DIAGNOSIS — D241 Benign neoplasm of right breast: Secondary | ICD-10-CM | POA: Diagnosis not present

## 2014-08-02 DIAGNOSIS — Z833 Family history of diabetes mellitus: Secondary | ICD-10-CM | POA: Insufficient documentation

## 2014-08-02 HISTORY — PX: BREAST LUMPECTOMY WITH RADIOACTIVE SEED LOCALIZATION: SHX6424

## 2014-08-02 LAB — GLUCOSE, CAPILLARY
GLUCOSE-CAPILLARY: 163 mg/dL — AB (ref 65–99)
Glucose-Capillary: 111 mg/dL — ABNORMAL HIGH (ref 65–99)

## 2014-08-02 SURGERY — BREAST LUMPECTOMY WITH RADIOACTIVE SEED LOCALIZATION
Anesthesia: General | Site: Breast | Laterality: Right

## 2014-08-02 MED ORDER — FENTANYL CITRATE (PF) 100 MCG/2ML IJ SOLN
INTRAMUSCULAR | Status: DC | PRN
  Administered 2014-08-02: 50 ug via INTRAVENOUS
  Administered 2014-08-02: 75 ug via INTRAVENOUS
  Administered 2014-08-02: 25 ug via INTRAVENOUS

## 2014-08-02 MED ORDER — ONDANSETRON HCL 4 MG/2ML IJ SOLN: 4.0000 mg | Freq: Once | INTRAMUSCULAR | Status: DC | PRN

## 2014-08-02 MED ORDER — LIDOCAINE HCL (CARDIAC) 20 MG/ML IV SOLN
INTRAVENOUS | Status: DC | PRN
Start: 2014-08-02 — End: 2014-08-02
  Administered 2014-08-02: 100 mg via INTRAVENOUS

## 2014-08-02 MED ORDER — SUCCINYLCHOLINE CHLORIDE 20 MG/ML IJ SOLN
INTRAMUSCULAR | Status: DC | PRN
  Administered 2014-08-02: 125 mg via INTRAVENOUS

## 2014-08-02 MED ORDER — ONDANSETRON HCL 4 MG/2ML IJ SOLN
INTRAMUSCULAR | Status: DC | PRN
  Administered 2014-08-02 (×2): 4 mg via INTRAVENOUS

## 2014-08-02 MED ORDER — HYDROCODONE-ACETAMINOPHEN 10-325 MG PO TABS: 1.0000 | ORAL_TABLET | Freq: Four times a day (QID) | ORAL | Status: DC | PRN

## 2014-08-02 MED ORDER — SODIUM CHLORIDE 0.9 % IJ SOLN
INTRAMUSCULAR | Status: AC
  Filled 2014-08-02: qty 20

## 2014-08-02 MED ORDER — PHENYLEPHRINE 40 MCG/ML (10ML) SYRINGE FOR IV PUSH (FOR BLOOD PRESSURE SUPPORT)
PREFILLED_SYRINGE | INTRAVENOUS | Status: AC
  Filled 2014-08-02: qty 10

## 2014-08-02 MED ORDER — EPHEDRINE SULFATE 50 MG/ML IJ SOLN
INTRAMUSCULAR | Status: AC
  Filled 2014-08-02: qty 2

## 2014-08-02 MED ORDER — SCOPOLAMINE 1 MG/3DAYS TD PT72
MEDICATED_PATCH | TRANSDERMAL | Status: AC
  Filled 2014-08-02: qty 1

## 2014-08-02 MED ORDER — ACETAMINOPHEN 325 MG PO TABS
650.0000 mg | ORAL_TABLET | ORAL | Status: DC | PRN
  Administered 2014-08-02: 650 mg via ORAL

## 2014-08-02 MED ORDER — PROPOFOL 10 MG/ML IV BOLUS
INTRAVENOUS | Status: AC
  Filled 2014-08-02: qty 20

## 2014-08-02 MED ORDER — HYDROMORPHONE HCL 1 MG/ML IJ SOLN
INTRAMUSCULAR | Status: AC
  Filled 2014-08-02: qty 1

## 2014-08-02 MED ORDER — 0.9 % SODIUM CHLORIDE (POUR BTL) OPTIME
TOPICAL | Status: DC | PRN
  Administered 2014-08-02: 1000 mL

## 2014-08-02 MED ORDER — LACTATED RINGERS IV SOLN
INTRAVENOUS | Status: DC
  Administered 2014-08-02 (×2): via INTRAVENOUS

## 2014-08-02 MED ORDER — OXYCODONE-ACETAMINOPHEN 5-325 MG PO TABS
ORAL_TABLET | ORAL | Status: AC
  Filled 2014-08-02: qty 2

## 2014-08-02 MED ORDER — OXYCODONE HCL 5 MG PO TABS
5.0000 mg | ORAL_TABLET | ORAL | Status: DC | PRN
  Administered 2014-08-02: 10 mg via ORAL

## 2014-08-02 MED ORDER — BUPIVACAINE-EPINEPHRINE 0.25% -1:200000 IJ SOLN
INTRAMUSCULAR | Status: DC | PRN
  Administered 2014-08-02: 10 mL

## 2014-08-02 MED ORDER — LIDOCAINE HCL (CARDIAC) 20 MG/ML IV SOLN
INTRAVENOUS | Status: AC
  Filled 2014-08-02: qty 25

## 2014-08-02 MED ORDER — ACETAMINOPHEN 650 MG RE SUPP: 650.0000 mg | RECTAL | Status: DC | PRN

## 2014-08-02 MED ORDER — OXYCODONE HCL 5 MG PO TABS
ORAL_TABLET | ORAL | Status: AC
  Administered 2014-08-02: 10 mg via ORAL
  Filled 2014-08-02: qty 2

## 2014-08-02 MED ORDER — PROPOFOL 10 MG/ML IV BOLUS
INTRAVENOUS | Status: DC | PRN
  Administered 2014-08-02: 200 mg via INTRAVENOUS

## 2014-08-02 MED ORDER — FENTANYL CITRATE (PF) 100 MCG/2ML IJ SOLN
25.0000 ug | INTRAMUSCULAR | Status: DC | PRN
  Administered 2014-08-02: 50 ug via INTRAVENOUS

## 2014-08-02 MED ORDER — ARTIFICIAL TEARS OP OINT
TOPICAL_OINTMENT | OPHTHALMIC | Status: AC
  Filled 2014-08-02: qty 3.5

## 2014-08-02 MED ORDER — ONDANSETRON HCL 4 MG/2ML IJ SOLN
INTRAMUSCULAR | Status: AC
  Filled 2014-08-02: qty 8

## 2014-08-02 MED ORDER — LIDOCAINE HCL 4 % MT SOLN
OROMUCOSAL | Status: DC | PRN
  Administered 2014-08-02: 4 mL via TOPICAL

## 2014-08-02 MED ORDER — FENTANYL CITRATE (PF) 250 MCG/5ML IJ SOLN
INTRAMUSCULAR | Status: AC
  Filled 2014-08-02: qty 5

## 2014-08-02 MED ORDER — FENTANYL CITRATE (PF) 100 MCG/2ML IJ SOLN
INTRAMUSCULAR | Status: AC
  Administered 2014-08-02: 50 ug via INTRAVENOUS
  Filled 2014-08-02: qty 2

## 2014-08-02 MED ORDER — ACETAMINOPHEN 325 MG PO TABS
ORAL_TABLET | ORAL | Status: AC
  Administered 2014-08-02: 650 mg via ORAL
  Filled 2014-08-02: qty 2

## 2014-08-02 MED ORDER — SUCCINYLCHOLINE CHLORIDE 20 MG/ML IJ SOLN
INTRAMUSCULAR | Status: AC
  Filled 2014-08-02: qty 2

## 2014-08-02 MED ORDER — ROCURONIUM BROMIDE 50 MG/5ML IV SOLN
INTRAVENOUS | Status: AC
  Filled 2014-08-02: qty 1

## 2014-08-02 MED ORDER — MENTHOL 3 MG MT LOZG
LOZENGE | OROMUCOSAL | Status: AC
  Filled 2014-08-02: qty 9

## 2014-08-02 SURGICAL SUPPLY — 51 items
APPLIER CLIP 9.375 MED OPEN (MISCELLANEOUS)
APR CLP MED 9.3 20 MLT OPN (MISCELLANEOUS)
BINDER BREAST LRG (GAUZE/BANDAGES/DRESSINGS) IMPLANT
BINDER BREAST XLRG (GAUZE/BANDAGES/DRESSINGS) ×2 IMPLANT
BLADE SURG 10 STRL SS (BLADE) ×3 IMPLANT
BLADE SURG 15 STRL LF DISP TIS (BLADE) ×1 IMPLANT
BLADE SURG 15 STRL SS (BLADE) ×3
CANISTER SUCTION 2500CC (MISCELLANEOUS) IMPLANT
CHLORAPREP W/TINT 26ML (MISCELLANEOUS) ×3 IMPLANT
CLIP APPLIE 9.375 MED OPEN (MISCELLANEOUS) IMPLANT
CLOSURE WOUND 1/2 X4 (GAUZE/BANDAGES/DRESSINGS) ×1
COVER PROBE W GEL 5X96 (DRAPES) ×3 IMPLANT
COVER SURGICAL LIGHT HANDLE (MISCELLANEOUS) ×3 IMPLANT
DEVICE DUBIN SPECIMEN MAMMOGRA (MISCELLANEOUS) ×3 IMPLANT
DRAPE CHEST BREAST 15X10 FENES (DRAPES) ×3 IMPLANT
DRAPE UTILITY W/TAPE 26X15 (DRAPES) ×3 IMPLANT
ELECT CAUTERY BLADE 6.4 (BLADE) ×2 IMPLANT
ELECT COATED BLADE 2.86 ST (ELECTRODE) ×3 IMPLANT
ELECT REM PT RETURN 9FT ADLT (ELECTROSURGICAL) ×3
ELECTRODE REM PT RTRN 9FT ADLT (ELECTROSURGICAL) ×1 IMPLANT
GLOVE BIO SURGEON STRL SZ7 (GLOVE) ×6 IMPLANT
GLOVE BIOGEL PI IND STRL 7.0 (GLOVE) IMPLANT
GLOVE BIOGEL PI IND STRL 7.5 (GLOVE) ×1 IMPLANT
GLOVE BIOGEL PI INDICATOR 7.0 (GLOVE) ×2
GLOVE BIOGEL PI INDICATOR 7.5 (GLOVE) ×2
GLOVE SURG SS PI 7.0 STRL IVOR (GLOVE) ×2 IMPLANT
GOWN STRL REUS W/ TWL LRG LVL3 (GOWN DISPOSABLE) ×2 IMPLANT
GOWN STRL REUS W/TWL LRG LVL3 (GOWN DISPOSABLE) ×6
KIT BASIN OR (CUSTOM PROCEDURE TRAY) ×3 IMPLANT
KIT MARKER MARGIN INK (KITS) ×3 IMPLANT
LIQUID BAND (GAUZE/BANDAGES/DRESSINGS) ×2 IMPLANT
NDL HYPO 25X1 1.5 SAFETY (NEEDLE) ×1 IMPLANT
NEEDLE HYPO 25X1 1.5 SAFETY (NEEDLE) ×3 IMPLANT
NS IRRIG 1000ML POUR BTL (IV SOLUTION) IMPLANT
PACK SURGICAL SETUP 50X90 (CUSTOM PROCEDURE TRAY) ×3 IMPLANT
PENCIL BUTTON HOLSTER BLD 10FT (ELECTRODE) ×3 IMPLANT
SPONGE LAP 18X18 X RAY DECT (DISPOSABLE) ×3 IMPLANT
STRIP CLOSURE SKIN 1/2X4 (GAUZE/BANDAGES/DRESSINGS) ×1 IMPLANT
SUT MNCRL AB 4-0 PS2 18 (SUTURE) ×3 IMPLANT
SUT SILK 2 0 SH (SUTURE) IMPLANT
SUT VIC AB 2-0 SH 27 (SUTURE) ×3
SUT VIC AB 2-0 SH 27XBRD (SUTURE) ×1 IMPLANT
SUT VIC AB 3-0 SH 27 (SUTURE) ×3
SUT VIC AB 3-0 SH 27X BRD (SUTURE) ×1 IMPLANT
SYR BULB 3OZ (MISCELLANEOUS) ×3 IMPLANT
SYR CONTROL 10ML LL (SYRINGE) ×3 IMPLANT
TOWEL OR 17X24 6PK STRL BLUE (TOWEL DISPOSABLE) ×3 IMPLANT
TOWEL OR 17X26 10 PK STRL BLUE (TOWEL DISPOSABLE) ×3 IMPLANT
TUBE CONNECTING 12'X1/4 (SUCTIONS)
TUBE CONNECTING 12X1/4 (SUCTIONS) IMPLANT
YANKAUER SUCT BULB TIP NO VENT (SUCTIONS) IMPLANT

## 2014-08-02 NOTE — Discharge Instructions (Signed)
[facility] °Office Phone Number [phone] °POST OP INSTRUCTIONS ° °Always review your discharge instruction sheet given to you by the facility where your surgery was performed. ° °IF YOU HAVE DISABILITY OR FAMILY LEAVE FORMS, YOU MUST BRING THEM TO THE OFFICE FOR PROCESSING.  DO NOT GIVE THEM TO YOUR DOCTOR. ° °1. A prescription for pain medication may be given to you upon discharge.  Take your pain medication as prescribed, if needed.  If narcotic pain medicine is not needed, then you may take acetaminophen (Tylenol), naprosyn (Alleve) or ibuprofen (Advil) as needed. °2. Take your usually prescribed medications unless otherwise directed °3. If you need a refill on your pain medication, please contact your pharmacy.  They will contact our office to request authorization.  Prescriptions will not be filled after 5pm or on week-ends. °4. You should eat very light the first 24 hours after surgery, such as soup, crackers, pudding, etc.  Resume your normal diet the day after surgery. °5. Most patients will experience some swelling and bruising in the breast.  Ice packs and a good support bra will help.  Wear the breast binder provided or a sports bra for 72 hours day and night.  After that wear a sports bra during the day until you return to the office. Swelling and bruising can take several days to resolve.  °6. It is common to experience some constipation if taking pain medication after surgery.  Increasing fluid intake and taking a stool softener will usually help or prevent this problem from occurring.  A mild laxative (Milk of Magnesia or Miralax) should be taken according to package directions if there are no bowel movements after 48 hours. °7. Unless discharge instructions indicate otherwise, you may remove your bandages 48 hours after surgery and you may shower at that time.  You may have steri-strips (small skin tapes) in place directly over the incision.  These strips should be left on the  skin for 7-10 days and will come off on their own.  If your surgeon used skin glue on the incision, you may shower in 24 hours.  The glue will flake off over the next 2-3 weeks.  Any sutures or staples will be removed at the office during your follow-up visit. °8. ACTIVITIES:  You may resume regular daily activities (gradually increasing) beginning the next day.  Wearing a good support bra or sports bra minimizes pain and swelling.  You may have sexual intercourse when it is comfortable. °a. You may drive when you no longer are taking prescription pain medication, you can comfortably wear a seatbelt, and you can safely maneuver your car and apply brakes. °b. RETURN TO WORK:  ______________________________________________________________________________________ °9. You should see your doctor in the office for a follow-up appointment approximately two weeks after your surgery.  Your doctor’s nurse will typically make your follow-up appointment when she calls you with your pathology report.  Expect your pathology report 3-4 business days after your surgery.  You may call to check if you do not hear from us after three days. °10. OTHER INSTRUCTIONS: _______________________________________________________________________________________________ _____________________________________________________________________________________________________________________________________ °_____________________________________________________________________________________________________________________________________ °_____________________________________________________________________________________________________________________________________ ° °WHEN TO CALL DR Dezeray Puccio: °1. Fever over 101.0 °2. Nausea and/or vomiting. °3. Extreme swelling or bruising. °4. Continued bleeding from incision. °5. Increased pain, redness, or drainage from the incision. ° °The clinic staff is available to answer your questions during regular  business hours.  Please don’t hesitate to call and ask to speak to one of the nurses for clinical concerns.  If you   have a medical emergency, go to the nearest emergency room or call 911.  A surgeon from [facility] is always on call at the hospital. ° °For further questions, please visit [email or web address] ° °

## 2014-08-02 NOTE — Transfer of Care (Signed)
Immediate Anesthesia Transfer of Care Note  Patient: Mackenzie Bonilla  Procedure(s) Performed: Procedure(s): BREAST LUMPECTOMY WITH RADIOACTIVE SEED LOCALIZATION (Right)  Patient Location: PACU  Anesthesia Type:General  Level of Consciousness: awake and alert   Airway & Oxygen Therapy: Patient Spontanous Breathing and Patient connected to face mask oxygen  Post-op Assessment: Report given to RN and Post -op Vital signs reviewed and stable  Post vital signs: Reviewed and stable  Last Vitals:  Filed Vitals:   08/02/14 1035  BP: 142/79  Pulse: 96  Temp: 36.5 C  Resp: 20    Complications: No apparent anesthesia complications

## 2014-08-02 NOTE — Anesthesia Procedure Notes (Signed)
Procedure Name: Intubation Date/Time: 08/02/2014 12:38 PM Performed by: Suzy Bouchard Pre-anesthesia Checklist: Patient identified, Timeout performed, Emergency Drugs available, Suction available and Patient being monitored Patient Re-evaluated:Patient Re-evaluated prior to inductionOxygen Delivery Method: Circle system utilized Preoxygenation: Pre-oxygenation with 100% oxygen Intubation Type: IV induction and Rapid sequence Ventilation: Mask ventilation without difficulty Laryngoscope Size: Miller and 2 Grade View: Grade I Tube type: Oral Number of attempts: 1 Airway Equipment and Method: Stylet and LTA kit utilized Placement Confirmation: ETT inserted through vocal cords under direct vision,  breath sounds checked- equal and bilateral and positive ETCO2 Secured at: 23 cm Tube secured with: Tape Dental Injury: Teeth and Oropharynx as per pre-operative assessment

## 2014-08-02 NOTE — Op Note (Signed)
Preoperative diagnosis: Right breast mass with discordant core biopsy Postoperative diagnosis: Same as above Procedure: Right breast radioactive seed guided excisional biopsy Surgeon: Dr. Serita Grammes Anesthesia: Gen. Specimens: Right breast tissue marked with paint EBL: minimal Complications: None Drains: None Disposition to recovery in stable condition Sponge and needle count correct at completion  Indications: This a 13 yof who had screening mammogram with right breast abnormality.  Core biopsy showed pash that was discordant.  We discussed excision due to this. She had a radioactive seed placed prior to beginning.  These mammograms were in the operating room.   Procedure: After informed consent was obtained the patient was taken to the operating room. Sequential compression devices were on her legs. She was then placed under general anesthesia without complication. Her right breast was prepped and draped in the standard sterile surgical fashion. A surgical time out was then performed.  I located the radioactive seed in the superior portion of the breast with the neoprobe. I elected to make an incision over it as opposed to the areola as it was quite a long distance. I then was able to identify the location of the seed after making a curvilinear incision. I infiltrated Marcaine.  I then proceeded to remove the area around the radioactive seed. This was then passed off the table. Hemostasis was observed. The radioactive seed was confirmed to be removed also by taking a mammogram. The clip was also present.  This was confirmed by radiology. I then closed with 2-0 Vicryl, 3-0 Vicryl, 4-0 Monocryl, glue and Steri-Strips. She tolerated this well. A breast binder was placed. She was transferred to recovery in stable condition.

## 2014-08-02 NOTE — Telephone Encounter (Signed)
Electronic refill request, last OV was f/u on 04/13/14, please advise

## 2014-08-02 NOTE — Anesthesia Postprocedure Evaluation (Signed)
  Anesthesia Post-op Note  Patient: Mackenzie Bonilla  Procedure(s) Performed: Procedure(s): BREAST LUMPECTOMY WITH RADIOACTIVE SEED LOCALIZATION (Right)  Patient Location: PACU  Anesthesia Type:General  Level of Consciousness: awake, alert , oriented and patient cooperative  Airway and Oxygen Therapy: Patient Spontanous Breathing  Post-op Pain: mild  Post-op Assessment: Post-op Vital signs reviewed, Patient's Cardiovascular Status Stable, Respiratory Function Stable, Patent Airway, No signs of Nausea or vomiting and Pain level controlled              Post-op Vital Signs: stable  Last Vitals:  Filed Vitals:   08/02/14 1035  BP: 142/79  Pulse: 96  Temp: 36.5 C  Resp: 20    Complications: No apparent anesthesia complications

## 2014-08-02 NOTE — Interval H&P Note (Signed)
History and Physical Interval Note:  08/02/2014 12:17 PM  Mackenzie Bonilla  has presented today for surgery, with the diagnosis of RIGHT BREAST MASS  The various methods of treatment have been discussed with the patient and family. After consideration of risks, benefits and other options for treatment, the patient has consented to  Procedure(s): BREAST LUMPECTOMY WITH RADIOACTIVE SEED LOCALIZATION (Right) as a surgical intervention .  The patient's history has been reviewed, patient examined, no change in status, stable for surgery.  I have reviewed the patient's chart and labs.  Questions were answered to the patient's satisfaction.     Balbina Depace

## 2014-08-02 NOTE — Telephone Encounter (Signed)
Please refill times 3 

## 2014-08-02 NOTE — H&P (Signed)
Mackenzie Bonilla is an 49 y.o. female.   Chief Complaint: right breast mass HPI:  89 yof with multiple medical problems, no prior breast history, presents after undergoing routine mm that shows an upper outer breast mass/distortion. US shows a cluster of small cysts. density is B. biopsy was done showing fibrocystic changes and pash- biopsy found to be discordant. she was referred for excision. no discharge or palpable mass   Past Medical History  Diagnosis Date  . Allergy     allergic rhinitis  . Depression   . Hyperlipidemia   . Arthritis     osteoarthritis  . Obesity   . Lymphedema   . Tachycardia   . Neuromuscular disorder     fibromyalgia  . Hyperhidrosis   . GERD (gastroesophageal reflux disease)     EGD negative 06/2001// EGD erythematous mucosa, polyp 08/2008  . Fatty liver 07/2008    abd. ultrasound - fatty liver ; slt dilated cbd (no stones ) 06/10// abd.  ultrasound normal on 04/2006  . Diabetes mellitus without complication   . Fibromyalgia   . Shortness of breath dyspnea   . Asthma   . Kidney stones   . UTI (lower urinary tract infection)   . High uric acid in 24 hour urine specimen   . Anemia   . TMJ (dislocation of temporomandibular joint)   . Anxiety   . Claustrophobia     does not like oxygen mask covering face    Past Surgical History  Procedure Laterality Date  . Tonsillectomy    . Septoplasty      two times  . Foot surgery    . Uterine tumor  09/2001  . Vagus nerve stimulator insertion    . Endometrial biopsy  01/2004  . Uterine fibroid surgery  06/2005    ablation  . Colonoscopy    . Esophagogastroduodenoscopy      Family History  Problem Relation Age of Onset  . Hypertension Father   . Cancer Father     pancreatic cancer  . Hypertension Sister   . Heart disease Maternal Grandmother 60    MI  . Diabetes Maternal Grandmother   . Heart disease Paternal Grandmother 74    MI  . Diabetes Paternal Grandmother    Social History:   reports that she has never smoked. She has never used smokeless tobacco. She reports that she does not drink alcohol or use illicit drugs.  Allergies:  Allergies  Allergen Reactions  . Cephalexin   . Codeine     REACTION: ? reaction  . Duloxetine   . Hydrocodone     REACTION: ? reaction  . Levaquin [Levofloxacin] Nausea Only  . Metformin And Related Diarrhea    GI side effects   . Norelgestromin-Eth Estradiol   . Prednisone     Tolerates intraarticular depo-medrol.  . Quetiapine   . Sertraline Hcl     REACTION: vivid dreams  . Triazolam     REACTION: ? reaction  . Zaleplon   . Zocor [Simvastatin - High Dose]     achey  . Zolpidem Tartrate     REACTION: ? reaction    Medications Prior to Admission  Medication Sig Dispense Refill  . acetaminophen (TYLENOL) 500 MG tablet Take 500 mg by mouth every 6 (six) hours as needed.     Marland Kitchen albuterol (PROVENTIL HFA;VENTOLIN HFA) 108 (90 BASE) MCG/ACT inhaler Inhale 2 puffs into the lungs every 4 (four) hours as needed for wheezing.    Marland Kitchen  allopurinol (ZYLOPRIM) 100 MG tablet Take 1 tablet (100 mg total) by mouth daily. 30 tablet 11  . ALPRAZolam (XANAX) 1 MG tablet Take 2 mg by mouth 3 (three) times daily as needed.     . benzonatate (TESSALON) 200 MG capsule TAKE ONE CAPSULE BY MOUTH THREE TIMES DAILY AS NEEDED FOR COUGH (Patient taking differently: TAKE ONE CAPSULE BY MOUTH AT BEDTIME AS NEEDED FOR COUGH) 90 capsule 3  . buPROPion (WELLBUTRIN XL) 150 MG 24 hr tablet Take 300 mg by mouth every morning.     . cyclobenzaprine (FLEXERIL) 5 MG tablet TAKE 1 TABLET 3 TIMES DAILY AS NEEDED FOR MUSCLE SPASM 30 tablet 1  . Dulaglutide (TRULICITY) 1.61 WR/6.0AV SOPN Inject 0.75 mg into the skin once a week. 2 mL 3  . fenofibrate (TRICOR) 48 MG tablet Take 1 tablet (48 mg total) by mouth daily. 30 tablet 11  . FLUoxetine (PROZAC) 40 MG capsule Take 80 mg by mouth daily.     Marland Kitchen gabapentin (NEURONTIN) 100 MG capsule Take 1 capsule (100 mg total) by mouth  2 (two) times daily. 60 capsule 3  . glipiZIDE (GLIPIZIDE XL) 5 MG 24 hr tablet TAKE 1 TABLET ONCE DAILY WITH BREAKFAST 30 tablet 5  . norethindrone-ethinyl estradiol (GILDESS 1/20) 1-20 MG-MCG tablet Take 1 tablet by mouth daily.    Marland Kitchen omeprazole (PRILOSEC) 20 MG capsule Take 1 capsule (20 mg total) by mouth 2 (two) times daily. 60 capsule 5  . oxybutynin (DITROPAN XL) 15 MG 24 hr tablet Take 1 tablet (15 mg total) by mouth daily. 90 tablet 3  . spironolactone (ALDACTONE) 100 MG tablet Take 100 mg by mouth daily.     Marland Kitchen talc (ZEASORB) powder Apply topically as needed. (Patient taking differently: Apply 1 application topically daily as needed (sweat). ) 240 g 3  . traMADol (ULTRAM) 50 MG tablet TAKE ONE TABLET EVERY EIGHT HOURS AS NEEDED FOR PAIN 60 tablet 1  . traZODone (DESYREL) 150 MG tablet Take 450 mg by mouth at bedtime.     Marland Kitchen azelastine (ASTELIN) 0.1 % nasal spray Place 1 spray into both nostrils daily as needed for allergies.     . calcium carbonate (TUMS - DOSED IN MG ELEMENTAL CALCIUM) 500 MG chewable tablet Chew 1 tablet by mouth as needed for indigestion.       Results for orders placed or performed during the hospital encounter of 08/02/14 (from the past 48 hour(s))  Glucose, capillary     Status: Abnormal   Collection Time: 08/02/14 10:32 AM  Result Value Ref Range   Glucose-Capillary 163 (H) 65 - 99 mg/dL   No results found.  Review of Systems  All other systems reviewed and are negative.   Blood pressure 142/79, pulse 96, temperature 97.7 F (36.5 C), temperature source Oral, resp. rate 20, height 5\' 4"  (1.626 m), weight 149.687 kg (330 lb), SpO2 95 %. Physical Exam  Vitals (Sonya Bynum CMA; 07/22/2014 2:43 PM) 07/22/2014 2:43 PM Weight: 341 lb Height: 64in Body Surface Area: 2.64 m Body Mass Index: 58.53 kg/m Temp.: 80F(Temporal)  Pulse: 105 (Regular)  BP: 132/76 (Sitting, Left Arm, Standard)    Physical Exam Rolm Bookbinder MD; 07/22/2014 4:10  PM) General Mental Status-Alert. Orientation-Oriented X3.  Chest and Lung Exam Chest and lung exam reveals -on auscultation, normal breath sounds, no adventitious sounds and normal vocal resonance.  Breast Nipples-No Discharge. Breast Lump-No Palpable Breast Mass.  Cardiovascular Cardiovascular examination reveals -normal heart sounds, regular rate and rhythm with no  murmurs.  Lymphatic Note: difficult to examine due to habitus  Assessment/Plan Assessment & Plan Rolm Bookbinder MD; 07/22/2014 4:13 PM) BREAST MASS IN FEMALE [id number]  N63) Story: Right breast seed guided excisional biopsy discussed indication due to discordance and possibility of even having breast cancer. risks/recovery discussed. will do with seed guidance.     [place] 08/02/2014, 12:14 PM

## 2014-08-02 NOTE — Anesthesia Preprocedure Evaluation (Addendum)
Anesthesia Evaluation  Patient identified by MRN, date of birth, ID band  Reviewed: Allergy & Precautions, NPO status , Patient's Chart, lab work & pertinent test results  Airway Mallampati: II  TM Distance: <3 FB Neck ROM: Full    Dental  (+) Teeth Intact, Dental Advisory Given   Pulmonary shortness of breath, asthma ,    Pulmonary exam normal       Cardiovascular Normal cardiovascular exam    Neuro/Psych PSYCHIATRIC DISORDERS Anxiety Depression  Neuromuscular disease    GI/Hepatic GERD-  ,  Endo/Other  diabetes, Type 2, Oral Hypoglycemic AgentsMorbid obesity  Renal/GU Renal disease     Musculoskeletal  (+) Arthritis -, Fibromyalgia -  Abdominal   Peds  Hematology   Anesthesia Other Findings   Reproductive/Obstetrics                            Anesthesia Physical Anesthesia Plan  ASA: III  Anesthesia Plan: General   Post-op Pain Management:    Induction: Intravenous  Airway Management Planned: Oral ETT  Additional Equipment:   Intra-op Plan:   Post-operative Plan: Extubation in OR  Informed Consent: I have reviewed the patients History and Physical, chart, labs and discussed the procedure including the risks, benefits and alternatives for the proposed anesthesia with the patient or authorized representative who has indicated his/her understanding and acceptance.     Plan Discussed with: CRNA, Anesthesiologist and Surgeon  Anesthesia Plan Comments:         Anesthesia Quick Evaluation

## 2014-08-03 ENCOUNTER — Encounter (HOSPITAL_COMMUNITY): Payer: Self-pay | Admitting: General Surgery

## 2014-08-03 NOTE — Telephone Encounter (Signed)
done

## 2014-08-06 ENCOUNTER — Telehealth: Payer: Self-pay

## 2014-08-06 NOTE — Telephone Encounter (Signed)
Diabetic Bundle. Left voicemail advising pt her A1C blood test is due. Pt advised to contact PCP's office to schedule.  

## 2014-08-10 ENCOUNTER — Other Ambulatory Visit: Payer: Self-pay | Admitting: Family Medicine

## 2014-08-11 ENCOUNTER — Telehealth: Payer: Self-pay | Admitting: *Deleted

## 2014-08-11 NOTE — Telephone Encounter (Signed)
See note below

## 2014-08-11 NOTE — Telephone Encounter (Signed)
Spoke to the patient. We discussed about follow up care and she has elected to follow up with Dr Dwyane Dee. Asked her to check sugars 2 x daily and continue current meds. Last A1c looks better. Follow up plan at next visit might be to lower glipizide and increase Trulicity per tolerance.  Please could you schedule an appt with Dr Dwyane Dee for mid July.

## 2014-08-11 NOTE — Telephone Encounter (Signed)
PT requested acll back from Dr. Howell Rucks. I tried to help her but prefers a call back from her because she values her opinion on what she is to do from here and who she recommends she follow up. Offered pt appointment with [facility] in [place] or referral to [facility]. States she would like Dr. Howell Rucks to decide. She states she would like to know waht she should be doing, started Trulicity at last appointment. Pt states she has not been checking her sugars since she had breast surgery. Call back [phone]

## 2014-08-12 NOTE — Telephone Encounter (Signed)
See Dr. Boyd Kerbs note, pt needs scheduled with Dr. Dwyane Dee and notified. Thanks!

## 2014-08-13 NOTE — Telephone Encounter (Signed)
Pt scheduled 09/23/14 w/ Dr. Dwyane Dee. Pt is aware

## 2014-08-18 ENCOUNTER — Ambulatory Visit (INDEPENDENT_AMBULATORY_CARE_PROVIDER_SITE_OTHER): Payer: Medicare PPO | Admitting: Podiatry

## 2014-08-18 DIAGNOSIS — E1142 Type 2 diabetes mellitus with diabetic polyneuropathy: Secondary | ICD-10-CM | POA: Diagnosis not present

## 2014-08-18 MED ORDER — GABAPENTIN 300 MG PO CAPS: 300.0000 mg | ORAL_CAPSULE | Freq: Two times a day (BID) | ORAL | Status: DC

## 2014-08-18 NOTE — Progress Notes (Signed)
Patient presents today for follow up numbness and tingling as well as burning sensations to both feet. She does feel like the medication is helping, but makes her sleepy.   Objective: Vital signs are stable she is alert and oriented 3. Pulses are palpable bilateral. Neurologic sensorium is intact per Semmes-Weinstein monofilament. She seems to be tolerating the  gabapentin fairly well.  Assessment: Small fiber neuropathy most likely associated with diabetic peripheral neuropathy  Plan: Increased gabapentin to 300 mg twice daily. I will follow up with her in 1 month to see how she is doing.

## 2014-08-19 ENCOUNTER — Ambulatory Visit: Payer: Self-pay | Admitting: *Deleted

## 2014-09-02 ENCOUNTER — Ambulatory Visit: Payer: Self-pay | Admitting: *Deleted

## 2014-09-07 ENCOUNTER — Ambulatory Visit: Payer: Medicare PPO

## 2014-09-07 ENCOUNTER — Other Ambulatory Visit: Payer: Self-pay | Admitting: Endocrinology

## 2014-09-07 NOTE — Telephone Encounter (Signed)
This is a patient of Dr. Boyd Kerbs, she has an appointment with you on August 9th, she is requesting a refill of Trulicity, Please advise on what to do?

## 2014-09-21 ENCOUNTER — Ambulatory Visit: Payer: Self-pay | Admitting: Endocrinology

## 2014-09-22 ENCOUNTER — Ambulatory Visit (INDEPENDENT_AMBULATORY_CARE_PROVIDER_SITE_OTHER): Payer: Medicare PPO | Admitting: Podiatry

## 2014-09-22 ENCOUNTER — Encounter: Payer: Self-pay | Admitting: Podiatry

## 2014-09-22 DIAGNOSIS — M79673 Pain in unspecified foot: Secondary | ICD-10-CM | POA: Diagnosis not present

## 2014-09-22 DIAGNOSIS — M9901 Segmental and somatic dysfunction of cervical region: Secondary | ICD-10-CM | POA: Diagnosis not present

## 2014-09-22 DIAGNOSIS — E1142 Type 2 diabetes mellitus with diabetic polyneuropathy: Secondary | ICD-10-CM

## 2014-09-22 DIAGNOSIS — M9902 Segmental and somatic dysfunction of thoracic region: Secondary | ICD-10-CM | POA: Diagnosis not present

## 2014-09-22 DIAGNOSIS — B351 Tinea unguium: Secondary | ICD-10-CM

## 2014-09-22 DIAGNOSIS — M5432 Sciatica, left side: Secondary | ICD-10-CM | POA: Diagnosis not present

## 2014-09-22 DIAGNOSIS — M797 Fibromyalgia: Secondary | ICD-10-CM | POA: Diagnosis not present

## 2014-09-22 DIAGNOSIS — M2550 Pain in unspecified joint: Secondary | ICD-10-CM | POA: Diagnosis not present

## 2014-09-22 DIAGNOSIS — M9903 Segmental and somatic dysfunction of lumbar region: Secondary | ICD-10-CM | POA: Diagnosis not present

## 2014-09-22 NOTE — Progress Notes (Signed)
Patient presents today for follow up numbness and tingling and to have her toenails cut. She does feel like the medication is helping, but makes her sleepy.   Objective: Vital signs are stable she is alert and oriented 3. Pulses are palpable bilateral. Neurologic sensorium is intact per Semmes-Weinstein monofilament. She seems to be tolerating the  gabapentin fairly well.  Assessment: Small fiber neuropathy most likely associated with diabetic peripheral neuropathy. Onychomycosis 1 through 5 bilateral.  Plan: Continue with gabapentin and I will follow up with her in 2 months.

## 2014-09-23 ENCOUNTER — Other Ambulatory Visit: Payer: Self-pay | Admitting: Family Medicine

## 2014-09-23 ENCOUNTER — Ambulatory Visit: Payer: Self-pay | Admitting: *Deleted

## 2014-09-28 ENCOUNTER — Telehealth: Payer: Self-pay | Admitting: Family Medicine

## 2014-09-28 NOTE — Telephone Encounter (Signed)
Pt needs diabetic testing supplies refilled but is in between endocrinologists at this time.  She sees her new endoconroligist in Sept. Can you authorize rx for testing supplies for pt. Pt uses [facility] mail order pharmacy fax number is [phone].   Pt call back [phone] thanks

## 2014-09-28 NOTE — Telephone Encounter (Signed)
Yes -please get me whatever paperwork is required-thanks  Ask her how many times a day she checks her blood sugar

## 2014-09-29 ENCOUNTER — Other Ambulatory Visit: Payer: Self-pay | Admitting: *Deleted

## 2014-09-29 ENCOUNTER — Encounter: Payer: Self-pay | Admitting: Family Medicine

## 2014-09-29 MED ORDER — ALBUTEROL SULFATE HFA 108 (90 BASE) MCG/ACT IN AERS: 2.0000 | INHALATION_SPRAY | RESPIRATORY_TRACT | Status: DC | PRN

## 2014-09-29 NOTE — Telephone Encounter (Signed)
See prev note, pt check BS twice a day, pt said we have to fax over a paper Rx to them since it's diabetic supplies

## 2014-09-29 NOTE — Telephone Encounter (Signed)
Written order in IN box 

## 2014-09-29 NOTE — Telephone Encounter (Signed)
Rx faxed

## 2014-09-29 NOTE — Telephone Encounter (Signed)
Left voicemail asking pt what testing supplies she needs and what brand, and also is there a form we need to fill out or does she need [place] to just send in the Rx electronically

## 2014-09-29 NOTE — Telephone Encounter (Signed)
Pt returned your call. She needs Accu check aviva plus test strips, accu check softclick lancets and accu check aviva control solutions.  She said that a rx needs to be faxed to [phone]. She stated that there were no forms that need to be filled out

## 2014-09-30 ENCOUNTER — Encounter: Payer: Self-pay | Admitting: Family Medicine

## 2014-09-30 ENCOUNTER — Ambulatory Visit (INDEPENDENT_AMBULATORY_CARE_PROVIDER_SITE_OTHER): Payer: Medicare PPO | Admitting: Family Medicine

## 2014-09-30 VITALS — BP 124/84 | HR 99 | Temp 98.6°F | Ht 64.0 in | Wt 333.2 lb

## 2014-09-30 DIAGNOSIS — Z6841 Body Mass Index (BMI) 40.0 and over, adult: Secondary | ICD-10-CM

## 2014-09-30 DIAGNOSIS — M1711 Unilateral primary osteoarthritis, right knee: Secondary | ICD-10-CM

## 2014-09-30 NOTE — Progress Notes (Signed)
Dr. Karleen Hampshire T. Rori Goar, MD, CAQ Sports Medicine Primary [facility] and Sports Medicine [address] [place], [address] Phone: [phone] Fax: [phone]  09/30/2014  Patient: Mackenzie Bonilla, MRN: [id number], DOB: Jun 14, 1965, 49 y.o.  Primary Physician:  Roxy Manns, MD  Chief Complaint: Knee Pain  Subjective:   MASSIEL Bonilla is a 49 y.o. very pleasant female patient who presents with the following:  Joined a gym.  R lateral numbness has started to gone away.   Leg curls -  Could only do 6 yesterday.  Working out at [employer].  Showed a few arm workouts.  Neck pain down the her neck and had to take a tylenol yesterday.   Leg extensions.   Past Medical History, Surgical History, Social History, Family History, Problem List, Medications, and Allergies have been reviewed and updated if relevant.  Patient Active Problem List   Diagnosis Date Noted  . Breast mass in female 07/28/2014  . UTI (urinary tract infection) 03/02/2014  . Right otitis media 10/21/2013  . Ingrown toenail 08/11/2013  . Right knee pain 07/23/2013  . Fall 07/23/2013  . Insect bite 06/05/2013  . Great toe pain 05/12/2013  . Medication management 11/20/2012  . Abdominal pain, unspecified site 04/23/2012  . Uric acid kidney stone 11/20/2011  . HYPERHIDROSIS 11/07/2009  . HOT FLASHES 06/17/2009  . Type 2 diabetes, uncontrolled, with neuropathy 08/23/2008  . VITAMIN D DEFICIENCY 05/07/2008  . VITAMIN B12 DEFICIENCY 06/04/2007  . CHRONIC FATIGUE SYNDROME 11/15/2006  . DIVERTICULITIS, HX OF 11/15/2006  . POLYCYSTIC OVARIAN DISEASE 10/16/2006  . Hyperlipidemia LDL goal <100 10/16/2006  . Morbid obesity with body mass index of 60.0-69.9 in adult 10/16/2006  . ANXIETY 10/16/2006  . PANIC DISORDER 10/16/2006  . BULIMIA 10/16/2006  . DEPRESSION 10/16/2006  . CARPAL TUNNEL SYNDROME 10/16/2006  . LYMPHEDEMA 10/16/2006  . ALLERGIC RHINITIS 10/16/2006  . ASTHMA, MILD, INTERMITTENT 10/16/2006    . TMJ SYNDROME 10/16/2006  . GERD 10/16/2006  . IRRITABLE BOWEL SYNDROME 10/16/2006  . OSTEOARTHRITIS 10/16/2006  . FIBROMYALGIA 10/16/2006  . COUGH, CHRONIC 10/16/2006    Past Medical History  Diagnosis Date  . Allergy     allergic rhinitis  . Depression   . Hyperlipidemia   . Arthritis     osteoarthritis  . Obesity   . Lymphedema   . Tachycardia   . Neuromuscular disorder     fibromyalgia  . Hyperhidrosis   . GERD (gastroesophageal reflux disease)     EGD negative 06/2001// EGD erythematous mucosa, polyp 08/2008  . Fatty liver 07/2008    abd. ultrasound - fatty liver ; slt dilated cbd (no stones ) 06/10// abd.  ultrasound normal on 04/2006  . Diabetes mellitus without complication   . Fibromyalgia   . Shortness of breath dyspnea   . Asthma   . Kidney stones   . UTI (lower urinary tract infection)   . High uric acid in 24 hour urine specimen   . Anemia   . TMJ (dislocation of temporomandibular joint)   . Anxiety   . Claustrophobia     does not like oxygen mask covering face    Past Surgical History  Procedure Laterality Date  . Tonsillectomy    . Septoplasty      two times  . Foot surgery    . Uterine tumor  09/2001  . Vagus nerve stimulator insertion    . Endometrial biopsy  01/2004  . Uterine fibroid surgery  06/2005  ablation  . Colonoscopy    . Esophagogastroduodenoscopy    . Breast lumpectomy with radioactive seed localization Right 08/02/2014    Procedure: BREAST LUMPECTOMY WITH RADIOACTIVE SEED LOCALIZATION;  Surgeon: Emelia Loron, MD;  Location: [facility] OR;  Service: General;  Laterality: Right;    Social History   Social History  . Marital Status: Single    Spouse Name: N/A  . Number of Children: N/A  . Years of Education: N/A   Occupational History  . Not on file.   Social History Main Topics  . Smoking status: Never Smoker   . Smokeless tobacco: Never Used  . Alcohol Use: No  . Drug Use: No  . Sexual Activity: Not on file    Other Topics Concern  . Not on file   Social History Narrative    Family History  Problem Relation Age of Onset  . Hypertension Father   . Cancer Father     pancreatic cancer  . Hypertension Sister   . Heart disease Maternal Grandmother 60    MI  . Diabetes Maternal Grandmother   . Heart disease Paternal Grandmother 54    MI  . Diabetes Paternal Grandmother     Allergies  Allergen Reactions  . Cephalexin   . Codeine     REACTION: ? reaction  . Duloxetine   . Hydrocodone     REACTION: ? reaction  . Levaquin [Levofloxacin] Nausea Only  . Metformin And Related Diarrhea    GI side effects   . Norelgestromin-Eth Estradiol   . Prednisone     Tolerates intraarticular depo-medrol.  . Quetiapine   . Sertraline Hcl     REACTION: vivid dreams  . Triazolam     REACTION: ? reaction  . Zaleplon   . Zocor [Simvastatin - High Dose]     achey  . Zolpidem Tartrate     REACTION: ? reaction   Medication list reviewed and updated in full in [facility].  GEN: No fevers, chills. Nontoxic. Primarily MSK c/o today. MSK: Detailed in the HPI GI: tolerating PO intake without difficulty Neuro: No numbness, parasthesias, or tingling associated. Otherwise the pertinent positives of the ROS are noted above.   Objective:   BP 124/84 mmHg  Pulse 99  Temp(Src) 98.6 F (37 C) (Oral)  Ht 5\' 4"  (1.626 m)  Wt 333 lb 4 oz (151.161 kg)  BMI 57.17 kg/m2   GEN: WDWN, NAD, Non-toxic, Alert & Oriented x 3 HEENT: Atraumatic, Normocephalic.  Ears and Nose: No external deformity. EXTR: No clubbing/cyanosis/edema NEURO: Normal gait.  PSYCH: tearful  There is crepitus on bilateral patellar with motion. She also has medial and lateral joint line tenderness.  Radiology: No results found.  Assessment and Plan:   Primary osteoarthritis of right knee  Morbid obesity with body mass index of 60.0-69.9 in adult  >15 minutes spent in face to face time with patient, >50% spent in  counselling or coordination of care: Essentially all of this visit was spent in counseling. I talked about basic exercise and nutrition with the patient. She is quite discouraged and she has been to the gym twice after joining, but she is discouraged and requested a doctor's note documenting her arthritis and restriction in her motion on her right wrist as a reason to get out of her gym payment. I provided her such, but tried to encourage her to the best of my ability to remain physically active and do whatever she could to lose weight at all  cost.  She also appeared more emotional than at other times when I had seen her in the past, and she is frustrated by her size and pain limitations. Body mass index is 57.17 kg/(m^2).   Signed,  Elpidio Galea. Reya Aurich, MD   Patient's Medications  New Prescriptions   No medications on file  Previous Medications   ACETAMINOPHEN (TYLENOL) 500 MG TABLET    Take 500 mg by mouth every 6 (six) hours as needed.    ALBUTEROL (PROVENTIL HFA;VENTOLIN HFA) 108 (90 BASE) MCG/ACT INHALER    Inhale 2 puffs into the lungs every 4 (four) hours as needed for wheezing.   ALLOPURINOL (ZYLOPRIM) 100 MG TABLET    Take 1 tablet (100 mg total) by mouth daily.   ALPRAZOLAM (XANAX) 1 MG TABLET    Take 2 mg by mouth 3 (three) times daily as needed.    BENZONATATE (TESSALON) 200 MG CAPSULE    TAKE ONE CAPSULE BY MOUTH THREE TIMES DAILY AS NEEDED FOR COUGH   BUPROPION (WELLBUTRIN XL) 150 MG 24 HR TABLET    Take 300 mg by mouth every morning.    CALCIUM CARBONATE (TUMS - DOSED IN MG ELEMENTAL CALCIUM) 500 MG CHEWABLE TABLET    Chew 1 tablet by mouth as needed for indigestion.    CYCLOBENZAPRINE (FLEXERIL) 5 MG TABLET    TAKE ONE TABLET THREE TIMES A DAY AS NEEDED FOR MUSCLE SPASM   FENOFIBRATE (TRICOR) 48 MG TABLET    Take 1 tablet (48 mg total) by mouth daily.   FLUOXETINE (PROZAC) 40 MG CAPSULE    Take 80 mg by mouth daily.    GABAPENTIN (NEURONTIN) 300 MG CAPSULE    Take 1 capsule (300  mg total) by mouth 2 (two) times daily.   GLIPIZIDE (GLIPIZIDE XL) 5 MG 24 HR TABLET    TAKE 1 TABLET ONCE DAILY WITH BREAKFAST   NORETHINDRONE-ETHINYL ESTRADIOL (JUNEL FE,GILDESS FE,LOESTRIN FE) 1-20 MG-MCG TABLET    Take 1 tablet by mouth daily.   OMEPRAZOLE (PRILOSEC) 20 MG CAPSULE    TAKE ONE CAPSULE TWICE A DAY   OXYBUTYNIN (DITROPAN XL) 15 MG 24 HR TABLET    Take 1 tablet (15 mg total) by mouth daily.   SPIRONOLACTONE (ALDACTONE) 100 MG TABLET    Take 100 mg by mouth daily.    TALC (ZEASORB) POWDER    Apply topically as needed.   TRAMADOL (ULTRAM) 50 MG TABLET    TAKE ONE TABLET EVERY EIGHT HOURS AS NEEDED FOR PAIN   TRAZODONE (DESYREL) 150 MG TABLET    Take 450 mg by mouth at bedtime.    TRULICITY 0.75 MG/0.5ML SOPN    INJECT THE CONTENTS OF ONE SYRINGE (0.75MG ) INTO THE SKIN ONCE A WEEK  Modified Medications   No medications on file  Discontinued Medications   AZELASTINE (ASTELIN) 0.1 % NASAL SPRAY    Place 1 spray into both nostrils daily as needed for allergies.    HYDROCODONE-ACETAMINOPHEN (NORCO) 10-325 MG PER TABLET    Take 1 tablet by mouth every 6 (six) hours as needed.   NORETHINDRONE-ETHINYL ESTRADIOL (GILDESS 1/20) 1-20 MG-MCG TABLET    Take 1 tablet by mouth daily.   OMEPRAZOLE (PRILOSEC) 20 MG CAPSULE    Take 1 capsule (20 mg total) by mouth 2 (two) times daily.

## 2014-09-30 NOTE — Progress Notes (Signed)
Pre visit review using our clinic review tool, if applicable. No additional management support is needed unless otherwise documented below in the visit note. 

## 2014-10-06 DIAGNOSIS — M2550 Pain in unspecified joint: Secondary | ICD-10-CM | POA: Diagnosis not present

## 2014-10-06 DIAGNOSIS — M9903 Segmental and somatic dysfunction of lumbar region: Secondary | ICD-10-CM | POA: Diagnosis not present

## 2014-10-06 DIAGNOSIS — M5432 Sciatica, left side: Secondary | ICD-10-CM | POA: Diagnosis not present

## 2014-10-06 DIAGNOSIS — M9902 Segmental and somatic dysfunction of thoracic region: Secondary | ICD-10-CM | POA: Diagnosis not present

## 2014-10-06 DIAGNOSIS — M9901 Segmental and somatic dysfunction of cervical region: Secondary | ICD-10-CM | POA: Diagnosis not present

## 2014-10-06 DIAGNOSIS — M797 Fibromyalgia: Secondary | ICD-10-CM | POA: Diagnosis not present

## 2014-10-14 ENCOUNTER — Ambulatory Visit (INDEPENDENT_AMBULATORY_CARE_PROVIDER_SITE_OTHER): Payer: Medicare PPO | Admitting: Endocrinology

## 2014-10-14 ENCOUNTER — Ambulatory Visit (INDEPENDENT_AMBULATORY_CARE_PROVIDER_SITE_OTHER): Payer: Medicare PPO | Admitting: Family Medicine

## 2014-10-14 ENCOUNTER — Ambulatory Visit: Payer: Self-pay | Admitting: Endocrinology

## 2014-10-14 ENCOUNTER — Encounter: Payer: Self-pay | Admitting: Endocrinology

## 2014-10-14 ENCOUNTER — Encounter: Payer: Self-pay | Admitting: Family Medicine

## 2014-10-14 VITALS — BP 120/90 | HR 106 | Temp 98.7°F | Ht 64.0 in | Wt 339.0 lb

## 2014-10-14 VITALS — BP 138/84 | HR 105 | Temp 98.2°F | Resp 16 | Ht 64.0 in | Wt 340.0 lb

## 2014-10-14 DIAGNOSIS — M791 Myalgia: Secondary | ICD-10-CM

## 2014-10-14 DIAGNOSIS — M1711 Unilateral primary osteoarthritis, right knee: Secondary | ICD-10-CM

## 2014-10-14 DIAGNOSIS — E1140 Type 2 diabetes mellitus with diabetic neuropathy, unspecified: Secondary | ICD-10-CM | POA: Diagnosis not present

## 2014-10-14 DIAGNOSIS — E781 Pure hyperglyceridemia: Secondary | ICD-10-CM

## 2014-10-14 DIAGNOSIS — E1165 Type 2 diabetes mellitus with hyperglycemia: Secondary | ICD-10-CM | POA: Diagnosis not present

## 2014-10-14 DIAGNOSIS — F410 Panic disorder [episodic paroxysmal anxiety] without agoraphobia: Secondary | ICD-10-CM | POA: Diagnosis not present

## 2014-10-14 DIAGNOSIS — IMO0002 Reserved for concepts with insufficient information to code with codable children: Secondary | ICD-10-CM

## 2014-10-14 DIAGNOSIS — Z6841 Body Mass Index (BMI) 40.0 and over, adult: Secondary | ICD-10-CM

## 2014-10-14 DIAGNOSIS — F502 Bulimia nervosa, unspecified: Secondary | ICD-10-CM

## 2014-10-14 DIAGNOSIS — IMO0001 Reserved for inherently not codable concepts without codable children: Secondary | ICD-10-CM

## 2014-10-14 DIAGNOSIS — M609 Myositis, unspecified: Secondary | ICD-10-CM

## 2014-10-14 LAB — POCT GLUCOSE (DEVICE FOR HOME USE): GLUCOSE FASTING, POC: 190 mg/dL — AB (ref 70–99)

## 2014-10-14 MED ORDER — FUROSEMIDE 20 MG PO TABS: 20.0000 mg | ORAL_TABLET | Freq: Every day | ORAL | Status: DC

## 2014-10-14 NOTE — Patient Instructions (Addendum)
Check blood sugars on waking up .Marland Kitchen 2-3 .Marland Kitchen times a week Also check blood sugars about 2 hours after a meal and do this after different meals by rotation  Recommended blood sugar levels on waking up is 90-130 and about 2 hours after meal is 140-180 Please bring blood sugar monitor to each visit.  Take Trulicty 2x per week and call for rx on 9/12  Find out cost of Invokana   When sugars <110 will reduce Glipizide  No sweet tea

## 2014-10-14 NOTE — Progress Notes (Signed)
Pre visit review using our clinic review tool, if applicable. No additional management support is needed unless otherwise documented below in the visit note. 

## 2014-10-14 NOTE — Patient Instructions (Signed)
OSTEOARTHRITIS:  For symptomatic relief: Tylenol: 2 tablets up to 3-4 times a day Regular NSAIDS are helpful (avoid in kidney disease and ulcers)  Topical Capzaicin Cream, as needed (wear glove to put on)  Hyaluronic Acid injections have good success, average relief is 6 months Glucosamine and Chondroitin often helpful - will take about 3 months to see if you have an effect. If you do, great, keep them up, if none at that point, no need to take in the future.  Omega-3 fish oils may help, 2 grams daily Ice joints on bad days, 20 min, 2-3 x / day REGULAR EXERCISE: swimming, Yoga, Tai Chi, bicycle (NON-IMPACT activity)  

## 2014-10-14 NOTE — Progress Notes (Signed)
Dr. Karleen Hampshire T. Melinna Linarez, MD, CAQ Sports Medicine Primary [facility] and Sports Medicine [address] [place], [address] Phone: [phone] Fax: [phone]  10/14/2014  Patient: Mackenzie Bonilla, MRN: [id number], DOB: July 20, 1965, 49 y.o.  Primary Physician:  Roxy Manns, MD  Chief Complaint: Knee Pain  Subjective:   Mackenzie Bonilla is a 49 y.o. very pleasant female patient who presents with the following:  See dictation below.  Pins and needles started in her R knee, muscles are jumping in her arms and neck.  Having a lot of pain, multiple places.   Lymphedema: B Trial of   09/30/2014 Last OV with Hannah Beat, MD  Joined a gym.  R lateral numbness has started to gone away.   Leg curls -  Could only do 6 yesterday.  Working out at [employer].  Showed a few arm workouts.  Neck pain down the her neck and had to take a tylenol yesterday.   Leg extensions.   Past Medical History, Surgical History, Social History, Family History, Problem List, Medications, and Allergies have been reviewed and updated if relevant.  Patient Active Problem List   Diagnosis Date Noted  . Hypertriglyceridemia 10/14/2014  . Breast mass in female 07/28/2014  . UTI (urinary tract infection) 03/02/2014  . Right otitis media 10/21/2013  . Ingrown toenail 08/11/2013  . Right knee pain 07/23/2013  . Fall 07/23/2013  . Insect bite 06/05/2013  . Great toe pain 05/12/2013  . Medication management 11/20/2012  . Abdominal pain, unspecified site 04/23/2012  . Uric acid kidney stone 11/20/2011  . HYPERHIDROSIS 11/07/2009  . HOT FLASHES 06/17/2009  . Type 2 diabetes, uncontrolled, with neuropathy 08/23/2008  . VITAMIN D DEFICIENCY 05/07/2008  . VITAMIN B12 DEFICIENCY 06/04/2007  . CHRONIC FATIGUE SYNDROME 11/15/2006  . DIVERTICULITIS, HX OF 11/15/2006  . POLYCYSTIC OVARIAN DISEASE 10/16/2006  . Hyperlipidemia LDL goal <100 10/16/2006  . Morbid obesity with body mass index of 60.0-69.9 in  adult 10/16/2006  . ANXIETY 10/16/2006  . PANIC DISORDER 10/16/2006  . BULIMIA 10/16/2006  . DEPRESSION 10/16/2006  . CARPAL TUNNEL SYNDROME 10/16/2006  . LYMPHEDEMA 10/16/2006  . ALLERGIC RHINITIS 10/16/2006  . ASTHMA, MILD, INTERMITTENT 10/16/2006  . TMJ SYNDROME 10/16/2006  . GERD 10/16/2006  . IRRITABLE BOWEL SYNDROME 10/16/2006  . OSTEOARTHRITIS 10/16/2006  . FIBROMYALGIA 10/16/2006  . COUGH, CHRONIC 10/16/2006    Past Medical History  Diagnosis Date  . Allergy     allergic rhinitis  . Depression   . Hyperlipidemia   . Arthritis     osteoarthritis  . Obesity   . Lymphedema   . Tachycardia   . Neuromuscular disorder     fibromyalgia  . Hyperhidrosis   . GERD (gastroesophageal reflux disease)     EGD negative 06/2001// EGD erythematous mucosa, polyp 08/2008  . Fatty liver 07/2008    abd. ultrasound - fatty liver ; slt dilated cbd (no stones ) 06/10// abd.  ultrasound normal on 04/2006  . Diabetes mellitus without complication   . Fibromyalgia   . Shortness of breath dyspnea   . Asthma   . Kidney stones   . UTI (lower urinary tract infection)   . High uric acid in 24 hour urine specimen   . Anemia   . TMJ (dislocation of temporomandibular joint)   . Anxiety   . Claustrophobia     does not like oxygen mask covering face    Past Surgical History  Procedure Laterality Date  . Tonsillectomy    .  Septoplasty      two times  . Foot surgery    . Uterine tumor  09/2001  . Vagus nerve stimulator insertion    . Endometrial biopsy  01/2004  . Uterine fibroid surgery  06/2005    ablation  . Colonoscopy    . Esophagogastroduodenoscopy    . Breast lumpectomy with radioactive seed localization Right 08/02/2014    Procedure: BREAST LUMPECTOMY WITH RADIOACTIVE SEED LOCALIZATION;  Surgeon: Emelia Loron, MD;  Location: [facility] OR;  Service: General;  Laterality: Right;    Social History   Social History  . Marital Status: Single    Spouse Name: N/A  . Number of  Children: N/A  . Years of Education: N/A   Occupational History  . Not on file.   Social History Main Topics  . Smoking status: Never Smoker   . Smokeless tobacco: Never Used  . Alcohol Use: No  . Drug Use: No  . Sexual Activity: Not on file   Other Topics Concern  . Not on file   Social History Narrative    Family History  Problem Relation Age of Onset  . Hypertension Father   . Cancer Father     pancreatic cancer  . Hypertension Sister   . Heart disease Maternal Grandmother 60    MI  . Diabetes Maternal Grandmother   . Heart disease Paternal Grandmother 78    MI  . Diabetes Paternal Grandmother     Allergies  Allergen Reactions  . Cephalexin   . Codeine     REACTION: ? reaction  . Duloxetine   . Hydrocodone     REACTION: ? reaction  . Levaquin [Levofloxacin] Nausea Only  . Metformin And Related Diarrhea    GI side effects   . Norelgestromin-Eth Estradiol   . Prednisone     Tolerates intraarticular depo-medrol.  . Quetiapine   . Sertraline Hcl     REACTION: vivid dreams  . Triazolam     REACTION: ? reaction  . Zaleplon   . Zocor [Simvastatin - High Dose]     achey  . Zolpidem Tartrate     REACTION: ? reaction   Medication list reviewed and updated in full in [facility].  GEN: No fevers, chills. Nontoxic. Primarily MSK c/o today. MSK: Detailed in the HPI GI: tolerating PO intake without difficulty Neuro: No numbness, parasthesias, or tingling associated. Otherwise the pertinent positives of the ROS are noted above.   Objective:   BP 120/90 mmHg  Pulse 106  Temp(Src) 98.7 F (37.1 C) (Oral)  Ht 5\' 4"  (1.626 m)  Wt 339 lb (153.769 kg)  BMI 58.16 kg/m2   GEN: WDWN, NAD, Non-toxic, Alert & Oriented x 3 HEENT: Atraumatic, Normocephalic.  Ears and Nose: No external deformity. EXTR: No clubbing/cyanosis/diffuse lower extremity lymphedema. NEURO: Normal gait.  PSYCH: tearful  There is crepitus on bilateral patellar with motion. She  also has medial and lateral joint line tenderness. Limited motion with flexion to only about a 100 on the right.  Radiology: No results found.  Assessment and Plan:   Primary osteoarthritis of right knee  Morbid obesity with body mass index of 60.0-69.9 in adult  PANIC DISORDER  Myalgia and myositis  BULIMIA  >25 minutes spent in face to face time with patient, >50% spent in counselling or coordination of care  The patient seems to not be doing all that well from a general mental health standpoint.  She is relatively decompensated and cried throughout most of  the examination and discussion today.  She seems worse compared to prior examinations in this regard.  She is embarrassed and seems to be ashamed that she is having difficulty at the gym.  She requested for me to get a note for her to get out of her financial obligations.  This is reasonable I think in this situation, given that the patient has gained 8 pounds since going to the gym, and she has resumed binge eating at fast food restaurants.  She actually is having to take some Xanax prior to going to [employer]. She does not appear acutely unstable, but notably worse than prior.  Previously, I gave her the contact information of Ms. Glennie Isle, who in my opinion has more experience and expertise compared to any other female personal trainer in this area.  The patient had a good rapport with her, and she is going to try to work out with her some in her smaller gym.  The patient's knees are not in good shape, and she has a BMI of 60.  She had a great response to Synvisc 3 series, so he may want to repeat that in the future.  She had a number of questions about lymphedema, and I did my best to answer them.  I'm going to send a copy of this note to my partner.  I think it might be reasonable to use some Lasix a few times a week to help with edema.  I was very clear with the patient that extra doses of this medication is harmful and  potentially lethal.  New Prescriptions   FUROSEMIDE (LASIX) 20 MG TABLET    Take 1 tablet (20 mg total) by mouth daily. Prn edema   Patient Instructions  OSTEOARTHRITIS:  For symptomatic relief: Tylenol: 2 tablets up to 3-4 times a day Regular NSAIDS are helpful (avoid in kidney disease and ulcers)  Topical Capzaicin Cream, as needed (wear glove to put on)  Hyaluronic Acid injections have good success, average relief is 6 months Glucosamine and Chondroitin often helpful - will take about 3 months to see if you have an effect. If you do, great, keep them up, if none at that point, no need to take in the future.  Omega-3 fish oils may help, 2 grams daily Ice joints on bad days, 20 min, 2-3 x / day REGULAR EXERCISE: swimming, Yoga, Tai Chi, bicycle (NON-IMPACT activity)      Signed,  Neenah Canter T. Inocencia Murtaugh, MD   Patient's Medications  New Prescriptions   FUROSEMIDE (LASIX) 20 MG TABLET    Take 1 tablet (20 mg total) by mouth daily. Prn edema  Previous Medications   ACCU-CHEK AVIVA PLUS TEST STRIP       ACCU-CHEK SOFTCLIX LANCETS LANCETS       ACETAMINOPHEN (TYLENOL) 500 MG TABLET    Take 500 mg by mouth every 6 (six) hours as needed.    ALBUTEROL (PROVENTIL HFA;VENTOLIN HFA) 108 (90 BASE) MCG/ACT INHALER    Inhale 2 puffs into the lungs every 4 (four) hours as needed for wheezing.   ALLOPURINOL (ZYLOPRIM) 100 MG TABLET    Take 1 tablet (100 mg total) by mouth daily.   ALPRAZOLAM (XANAX) 1 MG TABLET    Take 2 mg by mouth 3 (three) times daily as needed.    BENZONATATE (TESSALON) 200 MG CAPSULE    TAKE ONE CAPSULE BY MOUTH THREE TIMES DAILY AS NEEDED FOR COUGH   BLOOD GLUCOSE CALIBRATION (ACCU-CHEK AVIVA) SOLN  BUPROPION (WELLBUTRIN XL) 150 MG 24 HR TABLET    Take 300 mg by mouth every morning.    CALCIUM CARBONATE (TUMS - DOSED IN MG ELEMENTAL CALCIUM) 500 MG CHEWABLE TABLET    Chew 1 tablet by mouth as needed for indigestion.    CYCLOBENZAPRINE (FLEXERIL) 5 MG TABLET    TAKE  ONE TABLET THREE TIMES A DAY AS NEEDED FOR MUSCLE SPASM   FENOFIBRATE (TRICOR) 48 MG TABLET    Take 1 tablet (48 mg total) by mouth daily.   FLUOXETINE (PROZAC) 40 MG CAPSULE    Take 80 mg by mouth daily.    GABAPENTIN (NEURONTIN) 300 MG CAPSULE    Take 1 capsule (300 mg total) by mouth 2 (two) times daily.   GLIPIZIDE (GLIPIZIDE XL) 5 MG 24 HR TABLET    TAKE 1 TABLET ONCE DAILY WITH BREAKFAST   NORETHINDRONE-ETHINYL ESTRADIOL (JUNEL FE,GILDESS FE,LOESTRIN FE) 1-20 MG-MCG TABLET    Take 1 tablet by mouth daily.   OMEPRAZOLE (PRILOSEC) 20 MG CAPSULE    TAKE ONE CAPSULE TWICE A DAY   OXYBUTYNIN (DITROPAN XL) 15 MG 24 HR TABLET    Take 1 tablet (15 mg total) by mouth daily.   SPIRONOLACTONE (ALDACTONE) 100 MG TABLET    Take 100 mg by mouth daily.    TALC (ZEASORB) POWDER    Apply topically as needed.   TRAMADOL (ULTRAM) 50 MG TABLET    TAKE ONE TABLET EVERY EIGHT HOURS AS NEEDED FOR PAIN   TRAZODONE (DESYREL) 150 MG TABLET    Take 450 mg by mouth at bedtime.    TRULICITY 0.75 MG/0.5ML SOPN    INJECT THE CONTENTS OF ONE SYRINGE (0.75MG ) INTO THE SKIN ONCE A WEEK  Modified Medications   No medications on file  Discontinued Medications   No medications on file

## 2014-10-14 NOTE — Progress Notes (Signed)
Patient ID: Mackenzie Bonilla, female   DOB: Apr 25, 1965, 49 y.o.   MRN: [id number]   Reason for Appointment: Type II Diabetes follow-up   History of Present Illness   Diagnosis date: 2012  Previous history:    She probably had prediabetes at the onset and was initially tried on diet changes only  Her diagnosis was probably made when her A1c was 7.4 in 2012  Tried  Metformin which caused GI side effects   Also has had Glipizide on and off.  she has not been on insulin before.   Recent history:      She was last seen by her endocrinologist in May.  Because of her A1c going up to 8.9% in 2/16 she was started on Trulicity 0.75 mg weekly   With this her blood sugars appear to be improving in her A1c was better in June /May Oral hypoglycemic drugs:  Glipizide ER 5 mg daily ( restarted March 2016)  -Trulicity 0.75 mg Wallace weekly ( start April 2016)        Side effects from medications: metformin due to cramping, GI upset         Monitors blood glucose:  none since June at least.    Glucometer:  Accu-Chek         Blood Glucose readings 140-179  Hypoglycemia:  none         Meals: 2 meals per day.  Has difficulty complying with diet despite having follow-up sessions with dietitian Gets frustrated very easily. Addicted to sugars. Binges per mood changes, more if depressed. In the morning can't eat anything          Physical activity: exercise: at Gym, some elliptical, toning           Dietician visit: Most recent: 5/16   Weight control:  Wt Readings from Last 3 Encounters:  10/14/14 340 lb (154.223 kg)  09/30/14 333 lb 4 oz (151.161 kg)  08/02/14 330 lb (149.687 kg)           Diabetes labs:  Lab Results  Component Value Date   HGBA1C 7.3* 07/30/2014   HGBA1C 7.3* 06/29/2014   HGBA1C 8.9* 03/31/2014   Lab Results  Component Value Date   MICROALBUR 9.5* 06/29/2014   LDLCALC 44 06/17/2009   CREATININE 0.82 07/30/2014       Medication List       This list  is accurate as of: 10/14/14  2:50 PM.  Always use your most recent med list.               ACCU-CHEK AVIVA PLUS test strip  Generic drug:  glucose blood     ACCU-CHEK AVIVA Soln     ACCU-CHEK SOFTCLIX LANCETS lancets     acetaminophen 500 MG tablet  Commonly known as:  TYLENOL  Take 500 mg by mouth every 6 (six) hours as needed.     albuterol 108 (90 BASE) MCG/ACT inhaler  Commonly known as:  PROVENTIL HFA;VENTOLIN HFA  Inhale 2 puffs into the lungs every 4 (four) hours as needed for wheezing.     allopurinol 100 MG tablet  Commonly known as:  ZYLOPRIM  Take 1 tablet (100 mg total) by mouth daily.     ALPRAZolam 1 MG tablet  Commonly known as:  XANAX  Take 2 mg by mouth 3 (three) times daily as needed.     benzonatate 200 MG capsule  Commonly known as:  TESSALON  TAKE ONE CAPSULE BY MOUTH  THREE TIMES DAILY AS NEEDED FOR COUGH     buPROPion 150 MG 24 hr tablet  Commonly known as:  WELLBUTRIN XL  Take 300 mg by mouth every morning.     calcium carbonate 500 MG chewable tablet  Commonly known as:  TUMS - dosed in mg elemental calcium  Chew 1 tablet by mouth as needed for indigestion.     cyclobenzaprine 5 MG tablet  Commonly known as:  FLEXERIL  TAKE ONE TABLET THREE TIMES A DAY AS NEEDED FOR MUSCLE SPASM     fenofibrate 48 MG tablet  Commonly known as:  TRICOR  Take 1 tablet (48 mg total) by mouth daily.     FLUoxetine 40 MG capsule  Commonly known as:  PROZAC  Take 80 mg by mouth daily.     gabapentin 300 MG capsule  Commonly known as:  NEURONTIN  Take 1 capsule (300 mg total) by mouth 2 (two) times daily.     glipiZIDE 5 MG 24 hr tablet  Commonly known as:  GLIPIZIDE XL  TAKE 1 TABLET ONCE DAILY WITH BREAKFAST     norethindrone-ethinyl estradiol 1-20 MG-MCG tablet  Commonly known as:  JUNEL FE,GILDESS FE,LOESTRIN FE  Take 1 tablet by mouth daily.     omeprazole 20 MG capsule  Commonly known as:  PRILOSEC  TAKE ONE CAPSULE TWICE A DAY     oxybutynin  15 MG 24 hr tablet  Commonly known as:  DITROPAN XL  Take 1 tablet (15 mg total) by mouth daily.     spironolactone 100 MG tablet  Commonly known as:  ALDACTONE  Take 100 mg by mouth daily.     talc powder  Commonly known as:  ZEASORB  Apply topically as needed.     traMADol 50 MG tablet  Commonly known as:  ULTRAM  TAKE ONE TABLET EVERY EIGHT HOURS AS NEEDED FOR PAIN     traZODone 150 MG tablet  Commonly known as:  DESYREL  Take 450 mg by mouth at bedtime.     TRULICITY 0.75 MG/0.5ML Sopn  Generic drug:  Dulaglutide  INJECT THE CONTENTS OF ONE SYRINGE (0.75MG ) INTO THE SKIN ONCE A WEEK        Allergies:  Allergies  Allergen Reactions  . Cephalexin   . Codeine     REACTION: ? reaction  . Duloxetine   . Hydrocodone     REACTION: ? reaction  . Levaquin [Levofloxacin] Nausea Only  . Metformin And Related Diarrhea    GI side effects   . Norelgestromin-Eth Estradiol   . Prednisone     Tolerates intraarticular depo-medrol.  . Quetiapine   . Sertraline Hcl     REACTION: vivid dreams  . Triazolam     REACTION: ? reaction  . Zaleplon   . Zocor [Simvastatin - High Dose]     achey  . Zolpidem Tartrate     REACTION: ? reaction    Past Medical History  Diagnosis Date  . Allergy     allergic rhinitis  . Depression   . Hyperlipidemia   . Arthritis     osteoarthritis  . Obesity   . Lymphedema   . Tachycardia   . Neuromuscular disorder     fibromyalgia  . Hyperhidrosis   . GERD (gastroesophageal reflux disease)     EGD negative 06/2001// EGD erythematous mucosa, polyp 08/2008  . Fatty liver 07/2008    abd. ultrasound - fatty liver ; slt dilated cbd (no stones ) 06/10// abd.  ultrasound normal on 04/2006  . Diabetes mellitus without complication   . Fibromyalgia   . Shortness of breath dyspnea   . Asthma   . Kidney stones   . UTI (lower urinary tract infection)   . High uric acid in 24 hour urine specimen   . Anemia   . TMJ (dislocation of  temporomandibular joint)   . Anxiety   . Claustrophobia     does not like oxygen mask covering face    Past Surgical History  Procedure Laterality Date  . Tonsillectomy    . Septoplasty      two times  . Foot surgery    . Uterine tumor  09/2001  . Vagus nerve stimulator insertion    . Endometrial biopsy  01/2004  . Uterine fibroid surgery  06/2005    ablation  . Colonoscopy    . Esophagogastroduodenoscopy    . Breast lumpectomy with radioactive seed localization Right 08/02/2014    Procedure: BREAST LUMPECTOMY WITH RADIOACTIVE SEED LOCALIZATION;  Surgeon: Emelia Loron, MD;  Location: [facility] OR;  Service: General;  Laterality: Right;    Family History  Problem Relation Age of Onset  . Hypertension Father   . Cancer Father     pancreatic cancer  . Hypertension Sister   . Heart disease Maternal Grandmother 60    MI  . Diabetes Maternal Grandmother   . Heart disease Paternal Grandmother 67    MI  . Diabetes Paternal Grandmother     Social History:  reports that she has never smoked. She has never used smokeless tobacco. She reports that she does not drink alcohol or use illicit drugs.  Review of Systems:  Review of Systems  Gastrointestinal: Positive for nausea.       Worse this year, transient, some at bedtime   Retinopathy- No, Last DEE was in 2016  Neuropathy- some numbness and tingling in her feet. known neuropathy. Sees podiatry and wears DM shoes  Associated history - No CAD . No prior stroke.   No hypothyroidism. her last TSH was   Lab Results  Component Value Date   TSH 2.19 06/29/2014      Hyperlipidemia- her last set of lipids  Showed high triglycerides Currently on fenofibrate just started  In 3/ 2016  But no follow-up labs available  Allergic to high dose Zocor ( muscle aches).    Lab Results  Component Value Date   CHOL 203* 03/31/2014   HDL 38.10* 03/31/2014   LDLCALC 44 06/17/2009   LDLDIRECT 90.0 03/31/2014   TRIG * 03/31/2014     526.0 Triglyceride is over 400; calculations on Lipids are invalid.   CHOLHDL 5 03/31/2014       Examination:   BP 138/84 mmHg  Pulse 105  Temp(Src) 98.2 F (36.8 C)  Resp 16  Ht 5\' 4"  (1.626 m)  Wt 340 lb (154.223 kg)  BMI 58.33 kg/m2  SpO2 94%  Body mass index is 58.33 kg/(m^2).   She is anxious. No pedal edema  ASSESSMENT/ PLAN:    Diabetes type 2:  Blood glucose control is fair and can improve but is limited by her compliance with lifestyle changes She is not able to deal with requirements to manage her diabetes because of intercurrent illnesses, severe anxiety problems and difficulties with remembering what to do And financial limitations.   Her blood sugars are probably not controlled at present but A1c was done about 2 months ago.    Her glucose is high in the  office because of having regular  Sugar drink at lunch   Although she reports intermittent transient nausea and not clear this is more since she started Trulicity , she has difficulty pinpointing the symptoms.  However she is willing to try an increased dose of Trulicity  For simplicity will try her on the 0.75 mg twice a week since she has a supply at home and if she has no nausea with this she can call for a prescription for 1.5 mg  weekly.  She is not objecting to trying this medication for her diabetes if it helps despite her cost out of pocket per  Discussed importance of consistent diet and avoiding simple sugars She will also need to start checking her blood sugar again as directed at various times and discussed blood sugar targets    She has started a mild exercise program and hopefully she can continue to increase intensity and frequency    She may be able to taper off her glipizide when her blood sugars are near-normal , discussed when to call.   LIPIDS: she will request fasting lipids to be done by her PCP which will be more convenient  Patient Instructions  Check blood sugars on waking up .Marland Kitchen 2-3 .Marland Kitchen  times a week Also check blood sugars about 2 hours after a meal and do this after different meals by rotation  Recommended blood sugar levels on waking up is 90-130 and about 2 hours after meal is 140-180 Please bring blood sugar monitor to each visit.  Take Trulicty 2x per week and call for rx on 9/12  Find out cost of Invokana   When sugars <110 will reduce Glipizide  No sweet tea   Counseling time on subjects discussed above is over 50% of today's 25 minute visit   Zannie Locastro 10/14/2014, 2:50 PM

## 2014-10-26 ENCOUNTER — Telehealth: Payer: Self-pay | Admitting: Endocrinology

## 2014-10-26 ENCOUNTER — Other Ambulatory Visit: Payer: Self-pay | Admitting: *Deleted

## 2014-10-26 MED ORDER — DULAGLUTIDE 0.75 MG/0.5ML ~~LOC~~ SOAJ: SUBCUTANEOUS | Status: DC

## 2014-10-26 NOTE — Telephone Encounter (Signed)
Pt needs refill called in for trulicity please call into [facility]

## 2014-10-26 NOTE — Telephone Encounter (Signed)
Stay on Trulicity?

## 2014-10-26 NOTE — Telephone Encounter (Signed)
FYI  Please see below

## 2014-10-26 NOTE — Telephone Encounter (Signed)
rx sent

## 2014-10-26 NOTE — Telephone Encounter (Signed)
Pt letting us know that invokana is 40 dollars but she does not want to switch, the cost is no different

## 2014-10-27 ENCOUNTER — Other Ambulatory Visit: Payer: Self-pay | Admitting: *Deleted

## 2014-10-27 MED ORDER — DULAGLUTIDE 1.5 MG/0.5ML ~~LOC~~ SOAJ: SUBCUTANEOUS | Status: DC

## 2014-10-27 NOTE — Telephone Encounter (Signed)
15

## 2014-10-27 NOTE — Telephone Encounter (Signed)
Fax from Pharmacy says patient was expecting a dose increase on her Trulicity, rx was sent in for 0.75, is she suppose to be on the 1.5?

## 2014-10-27 NOTE — Telephone Encounter (Signed)
Noted, rx sent.

## 2014-10-28 ENCOUNTER — Encounter: Payer: Medicare PPO | Attending: Family Medicine | Admitting: *Deleted

## 2014-10-28 VITALS — Ht 64.0 in | Wt 336.0 lb

## 2014-10-28 DIAGNOSIS — Z713 Dietary counseling and surveillance: Secondary | ICD-10-CM | POA: Insufficient documentation

## 2014-10-28 DIAGNOSIS — E1165 Type 2 diabetes mellitus with hyperglycemia: Secondary | ICD-10-CM | POA: Insufficient documentation

## 2014-10-28 DIAGNOSIS — E1140 Type 2 diabetes mellitus with diabetic neuropathy, unspecified: Secondary | ICD-10-CM | POA: Diagnosis not present

## 2014-10-28 DIAGNOSIS — IMO0002 Reserved for concepts with insufficient information to code with codable children: Secondary | ICD-10-CM

## 2014-10-28 NOTE — Progress Notes (Signed)
  Medical Nutrition Therapy:  Appt start time: 1400 end time:  1430.  Assessment:  Primary concerns today:10/28/14.  Her endocrinologist from [facility] has moved away and she is now seeing Dr. Dwyane Dee here in [place]. She states she is cooking more now and she states she joined [employer]. But she states her Dr. Has told her not to go to gym due to her knee problems. Her ankles were swollen too so she is now on Lasix. Weight is stable at 336 pounds, but she has not gained weight since last visit in May.   Preferred Learning Style:  Visual  Hands on   Learning Readiness:   Contemplating  Ready  Change in progress  MEDICATIONS: see list, diabetes medication is Glipizide XL and now Trulicity   DIETARY INTAKE:  24-hr recall:  B ( AM): has problem with food intake in the AM, and she sleeps late  Snk ( AM): no  L ( PM): depends on what is in the house. May go to [employer] and get a burger, drink such as sweet tea and fries or other food (she thinks in 3's) Snk ( PM): grazes throughout the day D ( PM): She may cook 2-3 nights a week. Then she may not cook for a week or so. She cooks for 4 servings so she can save for a later day. Snk ( PM): no Beverages: soda, water  Usual physical activity: none at this time. She would prefer inside versus outside activities, she is not at all interested in water activities  Estimated energy needs: 1600 calories 180 g carbohydrates 120 g protein 44 g fat  Progress Towards Goal(s):  In progress.   Nutritional Diagnosis:  NI-1.5 Excessive energy intake As related to activity level.  As evidenced by BMI of 59.4 now down to 57.8    Intervention:  Nutrition counseling and diabetes education continued. Spent most of visit listening to her concerns and comending her on cooking more and the effort she appears to be making to cook more meals at home.  We disussed Sodium in more detail today due to the swelling in her legs recently. Also encouraged her  to be active every day in whatever form works for her.  Plan:  Aim for 3 Carb Choices per meal (45 grams) +/- 1 either way  Aim for 0-1 Carbs per snack if hungry  Include protein in moderation with your meals and snacks Continue reading food labels for Total Carbohydrate of foods Every 15 grams of carbohydrate = 1 Carb Choice and  Every 5 grams of fat is 1 Fat Choice Aim for 2300 mg Sodium per day  Aim for 600 mg or less / meal  Aim for 200 mg or less/snack Continue looking for new recipes to try, good job Consider options for an activity every day. Arm chair exercises at home would be terrific!  Teaching Method Utilized:  Visual Auditory  Handouts given during visit include: Menu Planning Sheet  Barriers to learning/adherence to lifestyle change: multiple mental health and physical issues  Demonstrated degree of understanding via:  Teach Back   Monitoring/Evaluation:  Dietary intake, exercise, reading food labels, and body weight in 4 week(s).

## 2014-10-28 NOTE — Patient Instructions (Addendum)
Plan:  Aim for 3 Carb Choices per meal (45 grams) +/- 1 either way  Aim for 0-1 Carbs per snack if hungry  Include protein in moderation with your meals and snacks Continue reading food labels for Total Carbohydrate of foods Every 15 grams of carbohydrate = 1 Carb Choice and  Every 5 grams of fat is 1 Fat Choice Aim for 2300 mg Sodium per day  Aim for 600 mg or less / meal  Aim for 200 mg or less/snack Continue looking for new recipes to try, good job Consider options for an activity every day. Arm chair exercises at home would be terrific!

## 2014-11-04 ENCOUNTER — Encounter: Payer: Self-pay | Admitting: *Deleted

## 2014-11-05 ENCOUNTER — Ambulatory Visit (INDEPENDENT_AMBULATORY_CARE_PROVIDER_SITE_OTHER): Payer: Medicare PPO | Admitting: Family Medicine

## 2014-11-05 ENCOUNTER — Encounter: Payer: Self-pay | Admitting: Family Medicine

## 2014-11-05 VITALS — BP 100/60 | HR 102 | Temp 98.8°F | Ht 64.0 in | Wt 339.0 lb

## 2014-11-05 DIAGNOSIS — F410 Panic disorder [episodic paroxysmal anxiety] without agoraphobia: Secondary | ICD-10-CM

## 2014-11-05 DIAGNOSIS — F329 Major depressive disorder, single episode, unspecified: Secondary | ICD-10-CM | POA: Diagnosis not present

## 2014-11-05 DIAGNOSIS — E1140 Type 2 diabetes mellitus with diabetic neuropathy, unspecified: Secondary | ICD-10-CM

## 2014-11-05 DIAGNOSIS — E1165 Type 2 diabetes mellitus with hyperglycemia: Secondary | ICD-10-CM

## 2014-11-05 DIAGNOSIS — F32A Depression, unspecified: Secondary | ICD-10-CM

## 2014-11-05 DIAGNOSIS — E781 Pure hyperglyceridemia: Secondary | ICD-10-CM

## 2014-11-05 DIAGNOSIS — IMO0002 Reserved for concepts with insufficient information to code with codable children: Secondary | ICD-10-CM

## 2014-11-05 DIAGNOSIS — Z23 Encounter for immunization: Secondary | ICD-10-CM

## 2014-11-05 DIAGNOSIS — E785 Hyperlipidemia, unspecified: Secondary | ICD-10-CM | POA: Diagnosis not present

## 2014-11-05 LAB — LIPID PANEL
Cholesterol: 192 mg/dL (ref 0–200)
HDL: 43.3 mg/dL (ref >39.00)
NonHDL: 148.27
Total CHOL/HDL Ratio: 4
Triglycerides: 305 mg/dL — ABNORMAL HIGH (ref 0.0–149.0)
VLDL: 61 mg/dL — ABNORMAL HIGH (ref 0.0–40.0)

## 2014-11-05 LAB — COMPREHENSIVE METABOLIC PANEL
ALK PHOS: 50 U/L (ref 39–117)
ALT: 14 U/L (ref 0–35)
AST: 15 U/L (ref 0–37)
Albumin: 3.8 g/dL (ref 3.5–5.2)
BUN: 13 mg/dL (ref 6–23)
CO2: 28 mEq/L (ref 19–32)
Calcium: 9.5 mg/dL (ref 8.4–10.5)
Chloride: 104 mEq/L (ref 96–112)
Creatinine, Ser: 0.82 mg/dL (ref 0.40–1.20)
GFR: 78.66 mL/min (ref >60.00)
Glucose, Bld: 152 mg/dL — ABNORMAL HIGH (ref 70–99)
Potassium: 4.3 mEq/L (ref 3.5–5.1)
Sodium: 142 mEq/L (ref 135–145)
TOTAL PROTEIN: 6.6 g/dL (ref 6.0–8.3)
Total Bilirubin: 0.2 mg/dL (ref 0.2–1.2)

## 2014-11-05 LAB — HEMOGLOBIN A1C: HEMOGLOBIN A1C: 6.9 % — AB (ref 4.6–6.5)

## 2014-11-05 LAB — LDL CHOLESTEROL, DIRECT: Direct LDL: 112 mg/dL

## 2014-11-05 NOTE — Patient Instructions (Addendum)
Talk to Dr Lajoyce Corners about considering biofeedback for your anxiety Mackenzie Bonilla  Keep working on diet change When you are ready for a referral to a new counselor please let me know  Labs today  Follow up with Dr. Dwyane Dee as planned  I do not think you are anemic- normal labs in June     Flu shot today

## 2014-11-05 NOTE — Progress Notes (Signed)
Subjective:    Patient ID: Mackenzie Bonilla, female    DOB: January 23, 1966, 49 y.o.   MRN: [id number]  HPI Here with several acute issues   It was mentioned to her that her nail beds were too pale - ? Of anemia  Also pale in her eyes ?   BP Readings from Last 3 Encounters:  11/05/14 100/60  10/14/14 120/90  10/14/14 138/84      Panic attacks -- 2 xanax am and 2 in pm  Having palpitations at the beginning of her panic attacks  Has "cried for 2 months solid" and depression  Sees psychiatry - Dr Lajoyce Corners - sees her twice monthly --"she says that she is medicine resistant" and that things will likely not change  She does not want to change doctors  Has seen Dr Rexene Edison for psychology - did now work out well  Cannot afford another counselor right now   Coping skills from years of counseling "are gone"  Has tried to meditate  Is not open to ECT She did have some success with biofeedback - briefly in her 20s   She scheduled her eye exam (was done last dec)     A lot of stress Breast surgery  Medical bills are coming in - overwhelming   Lab Results  Component Value Date   HGBA1C 7.3* 07/30/2014    Lab Results  Component Value Date   WBC 7.3 07/30/2014   HGB 13.7 07/30/2014   HCT 42.0 07/30/2014   MCV 93.3 07/30/2014   PLT 254 07/30/2014     Patient Active Problem List   Diagnosis Date Noted  . Hypertriglyceridemia 10/14/2014  . Breast mass in female 07/28/2014  . UTI (urinary tract infection) 03/02/2014  . Right otitis media 10/21/2013  . Ingrown toenail 08/11/2013  . Right knee pain 07/23/2013  . Fall 07/23/2013  . Insect bite 06/05/2013  . Great toe pain 05/12/2013  . Medication management 11/20/2012  . Abdominal pain, unspecified site 04/23/2012  . Uric acid kidney stone 11/20/2011  . HYPERHIDROSIS 11/07/2009  . HOT FLASHES 06/17/2009  . Type 2 diabetes, uncontrolled, with neuropathy 08/23/2008  . VITAMIN D DEFICIENCY 05/07/2008  . VITAMIN B12 DEFICIENCY  06/04/2007  . CHRONIC FATIGUE SYNDROME 11/15/2006  . DIVERTICULITIS, HX OF 11/15/2006  . POLYCYSTIC OVARIAN DISEASE 10/16/2006  . Hyperlipidemia LDL goal <100 10/16/2006  . Morbid obesity with body mass index of 60.0-69.9 in adult 10/16/2006  . ANXIETY 10/16/2006  . PANIC DISORDER 10/16/2006  . BULIMIA 10/16/2006  . Depression 10/16/2006  . CARPAL TUNNEL SYNDROME 10/16/2006  . LYMPHEDEMA 10/16/2006  . ALLERGIC RHINITIS 10/16/2006  . ASTHMA, MILD, INTERMITTENT 10/16/2006  . TMJ SYNDROME 10/16/2006  . GERD 10/16/2006  . IRRITABLE BOWEL SYNDROME 10/16/2006  . OSTEOARTHRITIS 10/16/2006  . Myalgia and myositis 10/16/2006  . COUGH, CHRONIC 10/16/2006   Past Medical History  Diagnosis Date  . Allergy     allergic rhinitis  . Depression   . Hyperlipidemia   . Arthritis     osteoarthritis  . Obesity   . Lymphedema   . Tachycardia   . Neuromuscular disorder     fibromyalgia  . Hyperhidrosis   . GERD (gastroesophageal reflux disease)     EGD negative 06/2001// EGD erythematous mucosa, polyp 08/2008  . Fatty liver 07/2008    abd. ultrasound - fatty liver ; slt dilated cbd (no stones ) 06/10// abd.  ultrasound normal on 04/2006  . Diabetes mellitus without complication   . Fibromyalgia   .  Shortness of breath dyspnea   . Asthma   . Kidney stones   . UTI (lower urinary tract infection)   . High uric acid in 24 hour urine specimen   . Anemia   . TMJ (dislocation of temporomandibular joint)   . Anxiety   . Claustrophobia     does not like oxygen mask covering face   Past Surgical History  Procedure Laterality Date  . Tonsillectomy    . Septoplasty      two times  . Foot surgery    . Uterine tumor  09/2001  . Vagus nerve stimulator insertion    . Endometrial biopsy  01/2004  . Uterine fibroid surgery  06/2005    ablation  . Colonoscopy    . Esophagogastroduodenoscopy    . Breast lumpectomy with radioactive seed localization Right 08/02/2014    Procedure: BREAST  LUMPECTOMY WITH RADIOACTIVE SEED LOCALIZATION;  Surgeon: Rolm Bookbinder, MD;  Location: [facility];  Service: General;  Laterality: Right;   Social History  Substance Use Topics  . Smoking status: Never Smoker   . Smokeless tobacco: Never Used  . Alcohol Use: No   Family History  Problem Relation Age of Onset  . Hypertension Father   . Cancer Father     pancreatic cancer  . Hypertension Sister   . Heart disease Maternal Grandmother 60    MI  . Diabetes Maternal Grandmother   . Heart disease Paternal Grandmother 31    MI  . Diabetes Paternal Grandmother    Allergies  Allergen Reactions  . Cephalexin   . Codeine     REACTION: ? reaction  . Duloxetine   . Hydrocodone     REACTION: ? reaction  . Levaquin [Levofloxacin] Nausea Only  . Metformin And Related Diarrhea    GI side effects   . Prednisone     Tolerates intraarticular depo-medrol.  . Quetiapine   . Sertraline Hcl     REACTION: vivid dreams  . Triazolam     REACTION: ? reaction  . Zaleplon   . Zocor [Simvastatin - High Dose]     achey  . Zolpidem Tartrate     hallucinations  . Norelgestromin-Eth Estradiol     Patch didn't stick to skin   Current Outpatient Prescriptions on File Prior to Visit  Medication Sig Dispense Refill  . ACCU-CHEK AVIVA PLUS test strip     . ACCU-CHEK SOFTCLIX LANCETS lancets     . acetaminophen (TYLENOL) 500 MG tablet Take 500 mg by mouth every 6 (six) hours as needed.     Marland Kitchen albuterol (PROVENTIL HFA;VENTOLIN HFA) 108 (90 BASE) MCG/ACT inhaler Inhale 2 puffs into the lungs every 4 (four) hours as needed for wheezing. 1 Inhaler 3  . allopurinol (ZYLOPRIM) 100 MG tablet Take 1 tablet (100 mg total) by mouth daily. 30 tablet 11  . ALPRAZolam (XANAX) 1 MG tablet Take 2 mg by mouth 3 (three) times daily as needed.     . benzonatate (TESSALON) 200 MG capsule TAKE ONE CAPSULE BY MOUTH THREE TIMES DAILY AS NEEDED FOR COUGH (Patient taking differently: TAKE ONE CAPSULE BY MOUTH AT BEDTIME AS  NEEDED FOR COUGH) 90 capsule 3  . Blood Glucose Calibration (ACCU-CHEK AVIVA) SOLN     . buPROPion (WELLBUTRIN XL) 150 MG 24 hr tablet Take 300 mg by mouth every morning.     . calcium carbonate (TUMS - DOSED IN MG ELEMENTAL CALCIUM) 500 MG chewable tablet Chew 1 tablet by mouth as needed for  indigestion.     . cyclobenzaprine (FLEXERIL) 5 MG tablet TAKE ONE TABLET THREE TIMES A DAY AS NEEDED FOR MUSCLE SPASM 30 tablet 2  . Dulaglutide (TRULICITY) 1.5 TI/4.5YK SOPN Inject contents of one pen once a week 4 pen 3  . fenofibrate (TRICOR) 48 MG tablet Take 1 tablet (48 mg total) by mouth daily. 30 tablet 11  . FLUoxetine (PROZAC) 40 MG capsule Take 80 mg by mouth daily.     . furosemide (LASIX) 20 MG tablet Take 1 tablet (20 mg total) by mouth daily. Prn edema 30 tablet 3  . gabapentin (NEURONTIN) 300 MG capsule Take 1 capsule (300 mg total) by mouth 2 (two) times daily. 60 capsule 5  . glipiZIDE (GLIPIZIDE XL) 5 MG 24 hr tablet TAKE 1 TABLET ONCE DAILY WITH BREAKFAST 30 tablet 5  . norethindrone-ethinyl estradiol (JUNEL FE,GILDESS FE,LOESTRIN FE) 1-20 MG-MCG tablet Take 1 tablet by mouth daily.    Marland Kitchen omeprazole (PRILOSEC) 20 MG capsule TAKE ONE CAPSULE TWICE A DAY 60 capsule 3  . oxybutynin (DITROPAN XL) 15 MG 24 hr tablet Take 1 tablet (15 mg total) by mouth daily. 90 tablet 3  . spironolactone (ALDACTONE) 100 MG tablet Take 100 mg by mouth daily.     Marland Kitchen talc (ZEASORB) powder Apply topically as needed. (Patient taking differently: Apply 1 application topically daily as needed (sweat). ) 240 g 3  . traMADol (ULTRAM) 50 MG tablet TAKE ONE TABLET EVERY EIGHT HOURS AS NEEDED FOR PAIN 60 tablet 1  . traZODone (DESYREL) 150 MG tablet Take 450 mg by mouth at bedtime.      No current facility-administered medications on file prior to visit.    Review of Systems Review of Systems  Constitutional: Negative for fever, appetite change,  and unexpected weight change. pos for fatigue  Eyes: Negative for pain  and visual disturbance.  Respiratory: Negative for  shortness of breath.   Cardiovascular: Negative for cp or palpitations    Gastrointestinal: Negative for nausea, diarrhea and constipation. pos for chronic abd pain and indigestion  Genitourinary: Negative for urgency and frequency.  Skin: Negative for pallor or rash   Neurological: Negative for weakness, light-headedness, numbness and headaches.  Hematological: Negative for adenopathy. Does not bruise/bleed easily.  Psychiatric/Behavioral: pos for severe dep/anx mood disorder with panic and also eating disorder .         Objective:   Physical Exam  Constitutional: She appears well-developed and well-nourished. No distress.  Morbidly obese and anxious appearing  HENT:  Head: Normocephalic and atraumatic.  Mouth/Throat: Oropharynx is clear and moist.  Eyes: Conjunctivae and EOM are normal. Pupils are equal, round, and reactive to light.  No conj pallor  Neck: Normal range of motion. Neck supple. No JVD present. Carotid bruit is not present. No thyromegaly present.  Cardiovascular: Normal rate, regular rhythm, normal heart sounds and intact distal pulses.  Exam reveals no gallop.   Pulmonary/Chest: Effort normal and breath sounds normal. No respiratory distress. She has no wheezes. She has no rales.  No crackles  Abdominal: Soft. Bowel sounds are normal. She exhibits no distension, no abdominal bruit and no mass. There is no tenderness.  Musculoskeletal: She exhibits no edema.  Lymphadenopathy:    She has no cervical adenopathy.  Neurological: She is alert. She has normal reflexes.  Skin: Skin is warm and dry. No rash noted. No erythema. No pallor.  Psychiatric: Her mood appears anxious. Her affect is labile. Her speech is tangential. She is slowed. She  is not agitated. Thought content is not paranoid. Cognition and memory are normal. She exhibits a depressed mood. She expresses no homicidal and no suicidal ideation. She is inattentive.           Assessment & Plan:   Problem List Items Addressed This Visit      Endocrine   Type 2 diabetes, uncontrolled, with neuropathy    Seeing endocrinology  Pt requested A1C with lab today  Her anx/dep and eating disorder complicate the disease-unable to work on lifestyle change       Relevant Orders   Hemoglobin A1c (Completed)     Other   Depression    Mixed mood disorder with eating disorder (bulemia / compulsive eating) Per pt is "medicine resistant" and she wants to stay with her current psychiatrist  Cannot currently afford counseling (which would be most important) I suspect an underlying personality disorder   This adds to many of her health problems ie : inability to control eating and lifestyle       Hyperlipidemia LDL goal <100    Diet not optimal  On tricor for trig  Lab today      Relevant Orders   Comprehensive metabolic panel (Completed)   Lipid panel (Completed)   Hypertriglyceridemia    Lab today Diet not optimal due to mood disorder  Has worked with a nutritionist       Relevant Orders   Comprehensive metabolic panel (Completed)   Lipid panel (Completed)   PANIC DISORDER - Primary    Urged to continue psychiatric f/u and see if she has coverage for ongoing CBT        Other Visit Diagnoses    Need for influenza vaccination        Relevant Orders    Flu Vaccine QUAD 36+ mos PF IM (Fluarix & Fluzone Quad PF) (Completed)

## 2014-11-05 NOTE — Progress Notes (Signed)
Pre visit review using our clinic review tool, if applicable. No additional management support is needed unless otherwise documented below in the visit note. 

## 2014-11-07 NOTE — Assessment & Plan Note (Signed)
Mixed mood disorder with eating disorder (bulemia / compulsive eating) Per pt is "medicine resistant" and she wants to stay with her current psychiatrist  Cannot currently afford counseling (which would be most important) I suspect an underlying personality disorder   This adds to many of her health problems ie : inability to control eating and lifestyle

## 2014-11-07 NOTE — Assessment & Plan Note (Signed)
Seeing endocrinology  Pt requested A1C with lab today  Her anx/dep and eating disorder complicate the disease-unable to work on lifestyle change

## 2014-11-07 NOTE — Assessment & Plan Note (Signed)
Urged to continue psychiatric f/u and see if she has coverage for ongoing CBT

## 2014-11-07 NOTE — Assessment & Plan Note (Signed)
Diet not optimal  On tricor for trig  Lab today

## 2014-11-07 NOTE — Assessment & Plan Note (Signed)
Lab today Diet not optimal due to mood disorder  Has worked with a nutritionist

## 2014-11-08 ENCOUNTER — Encounter: Payer: Self-pay | Admitting: *Deleted

## 2014-11-09 ENCOUNTER — Ambulatory Visit: Payer: Self-pay | Admitting: Family Medicine

## 2014-11-18 ENCOUNTER — Telehealth: Payer: Self-pay | Admitting: Endocrinology

## 2014-11-18 NOTE — Telephone Encounter (Signed)
Kayzlee,  What is she talking about??

## 2014-11-18 NOTE — Telephone Encounter (Signed)
Patient stated that she only need to come see him on need to basis, because she can't pay the copay, she has to choose food over her co pay. And send to all medication to [facility] on [address]

## 2014-11-22 NOTE — Telephone Encounter (Signed)
[facility] is on [address] and [address] wanted [place] to know that

## 2014-11-22 NOTE — Telephone Encounter (Signed)
She is pretty much say she can't afford to come to come to the doctor. She has to choose between copay, medication and food.

## 2014-11-25 ENCOUNTER — Ambulatory Visit: Payer: Self-pay | Admitting: Endocrinology

## 2014-11-26 ENCOUNTER — Other Ambulatory Visit: Payer: Self-pay | Admitting: *Deleted

## 2014-11-26 MED ORDER — ALLOPURINOL 100 MG PO TABS: 100.0000 mg | ORAL_TABLET | Freq: Every day | ORAL | Status: DC

## 2014-11-29 ENCOUNTER — Ambulatory Visit (INDEPENDENT_AMBULATORY_CARE_PROVIDER_SITE_OTHER): Payer: Medicare PPO | Admitting: Podiatry

## 2014-11-29 ENCOUNTER — Encounter: Payer: Self-pay | Admitting: Podiatry

## 2014-11-29 DIAGNOSIS — M9902 Segmental and somatic dysfunction of thoracic region: Secondary | ICD-10-CM | POA: Diagnosis not present

## 2014-11-29 DIAGNOSIS — M2550 Pain in unspecified joint: Secondary | ICD-10-CM | POA: Diagnosis not present

## 2014-11-29 DIAGNOSIS — B351 Tinea unguium: Secondary | ICD-10-CM

## 2014-11-29 DIAGNOSIS — M79676 Pain in unspecified toe(s): Secondary | ICD-10-CM

## 2014-11-29 DIAGNOSIS — M9903 Segmental and somatic dysfunction of lumbar region: Secondary | ICD-10-CM | POA: Diagnosis not present

## 2014-11-29 DIAGNOSIS — M9901 Segmental and somatic dysfunction of cervical region: Secondary | ICD-10-CM | POA: Diagnosis not present

## 2014-11-29 DIAGNOSIS — M5432 Sciatica, left side: Secondary | ICD-10-CM | POA: Diagnosis not present

## 2014-11-29 DIAGNOSIS — M797 Fibromyalgia: Secondary | ICD-10-CM | POA: Diagnosis not present

## 2014-11-29 DIAGNOSIS — E1142 Type 2 diabetes mellitus with diabetic polyneuropathy: Secondary | ICD-10-CM

## 2014-11-29 NOTE — Progress Notes (Signed)
She presents today for chief complaint of elongated nails diabetes neuropathy and pain radiating down last lateral aspect and posterior lateral aspect of her left leg all she's resting.  Objective: Vital signs are stable she is alert and oriented 3. Pulses are palpable. Nails are elongated and painful. No reproducible pain.  Assessment: Diabetic peripheral neuropathy painful elongated nails.  Plan: Debridement of nails and thickness in the length. Suggested that she follow-up with her primary care provider about the radiating pain in the posterior aspect of her left leg.  Roselind Messier DPM

## 2014-12-02 ENCOUNTER — Ambulatory Visit: Payer: Self-pay | Admitting: *Deleted

## 2014-12-13 ENCOUNTER — Telehealth: Payer: Self-pay | Admitting: *Deleted

## 2014-12-13 DIAGNOSIS — M9901 Segmental and somatic dysfunction of cervical region: Secondary | ICD-10-CM | POA: Diagnosis not present

## 2014-12-13 DIAGNOSIS — M5432 Sciatica, left side: Secondary | ICD-10-CM | POA: Diagnosis not present

## 2014-12-13 DIAGNOSIS — M797 Fibromyalgia: Secondary | ICD-10-CM | POA: Diagnosis not present

## 2014-12-13 DIAGNOSIS — M9903 Segmental and somatic dysfunction of lumbar region: Secondary | ICD-10-CM | POA: Diagnosis not present

## 2014-12-13 DIAGNOSIS — M9902 Segmental and somatic dysfunction of thoracic region: Secondary | ICD-10-CM | POA: Diagnosis not present

## 2014-12-13 DIAGNOSIS — M2550 Pain in unspecified joint: Secondary | ICD-10-CM | POA: Diagnosis not present

## 2014-12-13 NOTE — Telephone Encounter (Addendum)
Pt complains of worsening pain in her legs, feet and toes.  Pt request advice for pain management. I spoke with Dr. Milinda Pointer and he states pt needs to be evaluated by her primary with the pain is also in her upper legs.  Pt states she is having severe pain in the big toes, and she spoke with her pharmacist that knows her condition and he told her to take an extra Gabapentin at night, Tramadol and Tylenol together for the pain.  Pt states she has slight decrease in pain but is still hurting.  Pt scheduled an appt to see Dr. Milinda Pointer in [facility] 12/15/2014, his assistant states have pt keep this appt.  Pt is informed.

## 2014-12-15 ENCOUNTER — Ambulatory Visit (INDEPENDENT_AMBULATORY_CARE_PROVIDER_SITE_OTHER): Payer: Medicare PPO | Admitting: Podiatry

## 2014-12-15 ENCOUNTER — Encounter: Payer: Self-pay | Admitting: Podiatry

## 2014-12-15 VITALS — BP 147/93 | HR 99 | Resp 18

## 2014-12-15 DIAGNOSIS — G5762 Lesion of plantar nerve, left lower limb: Secondary | ICD-10-CM

## 2014-12-15 DIAGNOSIS — G5761 Lesion of plantar nerve, right lower limb: Secondary | ICD-10-CM

## 2014-12-15 DIAGNOSIS — E1142 Type 2 diabetes mellitus with diabetic polyneuropathy: Secondary | ICD-10-CM | POA: Diagnosis not present

## 2014-12-15 DIAGNOSIS — D3613 Benign neoplasm of peripheral nerves and autonomic nervous system of lower limb, including hip: Secondary | ICD-10-CM

## 2014-12-16 NOTE — Progress Notes (Signed)
She presents today for follow-up of her pain in her feet. She has failed to be seen by her primary doctor as I had suggested last visit. She states that she called her pharmacist and asked about pain and how to treat it with her current medications he advised her to take 2 gabapentin and 1 Tylenol at bedtime and she states that this seemed to help. She states she is having pain across the forefoot bilaterally. States that she's had injections before in this area that alleviated her symptoms for quite some time.  Objective: Vital signs are stable alert and oriented 3. Pulses are strongly palpable. Neurologic sensorium is diminished. she does have palpable Mulder's click to the third interdigital space bilateral.  Assessment: Diabetic peripheral neuropathy with neuromas third interdigital space bilateral.  Plan: Discussed etiology pathology conservative or surgical therapies. I injected the third interdigital space today with Kenalog and local anesthetic I recommended that she continue her current therapies with her gabapentin and Tylenol in the evening. I will follow-up with her as needed.  Roselind Messier DPM

## 2014-12-22 ENCOUNTER — Encounter: Payer: Medicare PPO | Attending: Family Medicine | Admitting: *Deleted

## 2014-12-22 ENCOUNTER — Encounter: Payer: Self-pay | Admitting: *Deleted

## 2014-12-22 VITALS — Ht 64.0 in | Wt 334.2 lb

## 2014-12-22 DIAGNOSIS — E1140 Type 2 diabetes mellitus with diabetic neuropathy, unspecified: Secondary | ICD-10-CM | POA: Insufficient documentation

## 2014-12-22 DIAGNOSIS — IMO0002 Reserved for concepts with insufficient information to code with codable children: Secondary | ICD-10-CM

## 2014-12-22 DIAGNOSIS — E1165 Type 2 diabetes mellitus with hyperglycemia: Secondary | ICD-10-CM | POA: Diagnosis not present

## 2014-12-22 DIAGNOSIS — Z713 Dietary counseling and surveillance: Secondary | ICD-10-CM | POA: Diagnosis not present

## 2014-12-22 NOTE — Progress Notes (Signed)
Medical Nutrition Therapy:  Appt start time: 7017 end time:  1600.  Assessment:  Primary concerns today:12/22/14. She is here for obesity and diabetes education, but primary concern is weight management. Weight loss of 2 pounds noted today from her September 15th appointment with me.She states she has been binge eating more but has done better the last couple of weeks. She complains of not feeling full. She is aware of the advantages of cooking at home vs. eating out but struggles with recipes and grocery shopping. She brought some recipes today for [place] to review. She would prefer using her crock pot more often as this simplifies her meal preparation. She states she gets nauseated in the afternoons and is having severe headaches. 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   Preferred Learning Style:  Visual  Hands on   Learning Readiness:   Contemplating  Ready  Change in progress  MEDICATIONS: see list, diabetes medication is Glipizide XL and now Trulicity   DIETARY INTAKE:  24-hr recall:  B ( AM): has problem with food intake in the AM, and she sleeps late  Snk ( AM): no  L ( PM): depends on what is in the house. May go to [employer] and get a burger, drink such as sweet tea and fries or other food (she thinks in 3's) Snk ( PM): grazes throughout the day D ( PM): She may cook 2-3 nights a week. Then she may not cook for a week or so. She cooks for 4 servings so she can save for a later day. Snk ( PM): no Beverages: soda, water  Usual physical activity: none at this time. She would prefer inside versus outside activities, she is not at all interested in water activities  Estimated energy needs: 1600 calories 180 g carbohydrates 120 g protein 44 g fat  Progress Towards Goal(s):  In progress.   Nutritional Diagnosis:  NI-1.5 Excessive energy intake As related to activity level.  As  evidenced by BMI of 59.4 now down to 57.5    Intervention:  Nutrition counseling and diabetes education continued. Again spent most of visit listening to her concerns and comending her on cooking more and the effort she appears to be making to cook more meals at home.  I addressed her statement about binge eating and suggested she follow up with another RD on our staff that has more experience with eating disorders. She expressed some interest in meeting with that RD at her next visit and alternating between the two of [place]. I reviewed the rationale of being active every day in whatever form works for her.  Plan:  Aim for 3 Carb Choices per meal (45 grams) +/- 1 either way  Aim for 0-1 Carbs per snack if hungry  Include protein in moderation with your meals and snacks Continue reading food labels for Total Carbohydrate of foods Every 15 grams of carbohydrate = 1 Carb Choice and  Every 5 grams of fat is 1 Fat Choice Aim for 2300 mg Sodium per day  Aim for 600 mg or less / meal  Aim for 200 mg or less/snack Continue looking for new recipes to try, good job Consider options for an activity every day. Arm chair exercises at home would be terrific!  Teaching Method Utilized:  Visual Auditory  Handouts given during visit include: Menu Planning Sheet  Barriers to learning/adherence to lifestyle change:  multiple mental health and physical issues  Demonstrated degree of understanding via:  Teach Back   Monitoring/Evaluation:  Dietary intake, exercise, reading food labels, and body weight in 4 week(s).

## 2014-12-24 ENCOUNTER — Telehealth: Payer: Self-pay | Admitting: *Deleted

## 2014-12-24 MED ORDER — CYANOCOBALAMIN 1000 MCG/ML IJ SOLN: 1000.0000 ug | INTRAMUSCULAR | Status: DC

## 2014-12-24 NOTE — Telephone Encounter (Signed)
You can order it every 8 weeks -thanks

## 2014-12-24 NOTE — Telephone Encounter (Signed)
Done and pt aware

## 2014-12-24 NOTE — Telephone Encounter (Signed)
Received a fax requesting a refill of pt's b12 inj 1052mcg/ml, requesting #1 mL, per meds b12 was d/c back in June 2016 with note pt not doing anymore  Called pt to see if she is still doing b12 inj and no answer so left voicemail requesting pt to call office back

## 2014-12-24 NOTE — Telephone Encounter (Signed)
See prev note, pt said she is still doing the b12 inj when she can remember (she has missed a few months), she get the pharmacist to give them to her, she received one yesterday so this is a refill request for the next injection.   Before I refill Rx pt wanted me to ask you if she needs a b12 inj every month (4 weeks), 6 weeks or 8 weeks, pt said it's hard to remember to get them every month so if you are okay with every 6 weeks or every 8 weeks would be better for her, but she will do what ever Dr. Glori Bickers recommends.

## 2014-12-27 DIAGNOSIS — M9901 Segmental and somatic dysfunction of cervical region: Secondary | ICD-10-CM | POA: Diagnosis not present

## 2014-12-27 DIAGNOSIS — M9902 Segmental and somatic dysfunction of thoracic region: Secondary | ICD-10-CM | POA: Diagnosis not present

## 2014-12-27 DIAGNOSIS — M797 Fibromyalgia: Secondary | ICD-10-CM | POA: Diagnosis not present

## 2014-12-27 DIAGNOSIS — M9903 Segmental and somatic dysfunction of lumbar region: Secondary | ICD-10-CM | POA: Diagnosis not present

## 2014-12-27 DIAGNOSIS — M2550 Pain in unspecified joint: Secondary | ICD-10-CM | POA: Diagnosis not present

## 2014-12-27 DIAGNOSIS — M5432 Sciatica, left side: Secondary | ICD-10-CM | POA: Diagnosis not present

## 2014-12-28 ENCOUNTER — Encounter: Payer: Self-pay | Admitting: Family Medicine

## 2014-12-28 ENCOUNTER — Ambulatory Visit (INDEPENDENT_AMBULATORY_CARE_PROVIDER_SITE_OTHER): Payer: Medicare PPO | Admitting: Family Medicine

## 2014-12-28 VITALS — BP 128/76 | HR 91 | Temp 98.3°F | Wt 337.0 lb

## 2014-12-28 DIAGNOSIS — J019 Acute sinusitis, unspecified: Secondary | ICD-10-CM | POA: Insufficient documentation

## 2014-12-28 DIAGNOSIS — M545 Low back pain, unspecified: Secondary | ICD-10-CM

## 2014-12-28 DIAGNOSIS — B373 Candidiasis of vulva and vagina: Secondary | ICD-10-CM | POA: Diagnosis not present

## 2014-12-28 DIAGNOSIS — N3000 Acute cystitis without hematuria: Secondary | ICD-10-CM

## 2014-12-28 DIAGNOSIS — J0100 Acute maxillary sinusitis, unspecified: Secondary | ICD-10-CM | POA: Diagnosis not present

## 2014-12-28 DIAGNOSIS — B3731 Acute candidiasis of vulva and vagina: Secondary | ICD-10-CM | POA: Insufficient documentation

## 2014-12-28 DIAGNOSIS — R350 Frequency of micturition: Secondary | ICD-10-CM

## 2014-12-28 DIAGNOSIS — M7918 Myalgia, other site: Secondary | ICD-10-CM | POA: Insufficient documentation

## 2014-12-28 LAB — POCT URINALYSIS DIPSTICK
BILIRUBIN UA: NEGATIVE
GLUCOSE UA: NEGATIVE
NITRITE UA: NEGATIVE
Protein, UA: 15
Spec Grav, UA: >=1.03
UROBILINOGEN UA: 0.2
pH, UA: 5.5

## 2014-12-28 MED ORDER — FLUCONAZOLE 150 MG PO TABS: ORAL_TABLET | ORAL | Status: DC

## 2014-12-28 MED ORDER — AZITHROMYCIN 250 MG PO TABS: ORAL_TABLET | ORAL | Status: DC

## 2014-12-28 NOTE — Progress Notes (Signed)
Pre visit review using our clinic review tool, if applicable. No additional management support is needed unless otherwise documented below in the visit note. 

## 2014-12-28 NOTE — Progress Notes (Signed)
Subjective:    Patient ID: Mackenzie Bonilla, female    DOB: 1965-11-23, 49 y.o.   MRN: [id number]  HPI Here for several acute issues today incl urinary and vaginal symptoms and sinus symptoms   Urinary symptoms  Low back pain  She can have a uti and not have symptoms  ? If she has a uti  Not drinking enough - habit is the reason  Incontinence comes and goes   Has had 2 chiropractic adj that hurt    Vaginal itching - more on the inside  That started 2 weeks ago  Has not tried any otc meds  It is hard to clean herself due to her morbid obesity  Some vaginal discharge - ? She thinks/ in underwear  Sinus symptoms  Had a R back tooth pulled 2 mo ago  Pain persists in the jaw  Ears hurt and feel pressure Blows nose a lot and spits out mucous-is yellow  No fever but she is really tired  Has chronic cough - hard to tell    Results for orders placed or performed in visit on 12/28/14  POCT urinalysis dipstick  Result Value Ref Range   Color, UA Yellow    Clarity, UA Cloudy    Glucose, UA Neg.    Bilirubin, UA Neg.    Ketones, UA Trace    Spec Grav, UA >=1.030    Blood, UA large 200 Ery/uL    pH, UA 5.5    Protein, UA 15 mg/dL    Urobilinogen, UA 0.2    Nitrite, UA Neg.    Leukocytes, UA Trace (A) Negative    Patient Active Problem List   Diagnosis Date Noted  . Acute sinusitis 12/28/2014  . Yeast vaginitis 12/28/2014  . Urine frequency 12/28/2014  . Back pain 12/28/2014  . Hypertriglyceridemia 10/14/2014  . Breast mass in female 07/28/2014  . UTI (urinary tract infection) 03/02/2014  . Right otitis media 10/21/2013  . Ingrown toenail 08/11/2013  . Right knee pain 07/23/2013  . Fall 07/23/2013  . Insect bite 06/05/2013  . Great toe pain 05/12/2013  . Medication management 11/20/2012  . Abdominal pain, unspecified site 04/23/2012  . Uric acid kidney stone 11/20/2011  . HYPERHIDROSIS 11/07/2009  . HOT FLASHES 06/17/2009  . Type 2 diabetes, uncontrolled, with  neuropathy ([place]) 08/23/2008  . VITAMIN D DEFICIENCY 05/07/2008  . VITAMIN B12 DEFICIENCY 06/04/2007  . CHRONIC FATIGUE SYNDROME 11/15/2006  . DIVERTICULITIS, HX OF 11/15/2006  . POLYCYSTIC OVARIAN DISEASE 10/16/2006  . Hyperlipidemia LDL goal <100 10/16/2006  . Morbid obesity with body mass index of 60.0-69.9 in adult ([place]) 10/16/2006  . ANXIETY 10/16/2006  . PANIC DISORDER 10/16/2006  . BULIMIA 10/16/2006  . Depression 10/16/2006  . CARPAL TUNNEL SYNDROME 10/16/2006  . LYMPHEDEMA 10/16/2006  . ALLERGIC RHINITIS 10/16/2006  . ASTHMA, MILD, INTERMITTENT 10/16/2006  . TMJ SYNDROME 10/16/2006  . GERD 10/16/2006  . IRRITABLE BOWEL SYNDROME 10/16/2006  . OSTEOARTHRITIS 10/16/2006  . Myalgia and myositis 10/16/2006  . COUGH, CHRONIC 10/16/2006   Past Medical History  Diagnosis Date  . Allergy     allergic rhinitis  . Depression   . Hyperlipidemia   . Arthritis     osteoarthritis  . Obesity   . Lymphedema   . Tachycardia   . Neuromuscular disorder (HCC)     fibromyalgia  . Hyperhidrosis   . GERD (gastroesophageal reflux disease)     EGD negative 06/2001// EGD erythematous mucosa, polyp 08/2008  . Fatty  liver 07/2008    abd. ultrasound - fatty liver ; slt dilated cbd (no stones ) 06/10// abd.  ultrasound normal on 04/2006  . Diabetes mellitus without complication ([place])   . Fibromyalgia   . Shortness of breath dyspnea   . Asthma   . Kidney stones   . UTI (lower urinary tract infection)   . High uric acid in 24 hour urine specimen   . Anemia   . TMJ (dislocation of temporomandibular joint)   . Anxiety   . Claustrophobia     does not like oxygen mask covering face   Past Surgical History  Procedure Laterality Date  . Tonsillectomy    . Septoplasty      two times  . Foot surgery    . Uterine tumor  09/2001  . Vagus nerve stimulator insertion    . Endometrial biopsy  01/2004  . Uterine fibroid surgery  06/2005    ablation  . Colonoscopy    .  Esophagogastroduodenoscopy    . Breast lumpectomy with radioactive seed localization Right 08/02/2014    Procedure: BREAST LUMPECTOMY WITH RADIOACTIVE SEED LOCALIZATION;  Surgeon: Rolm Bookbinder, MD;  Location: [facility];  Service: General;  Laterality: Right;   Social History  Substance Use Topics  . Smoking status: Never Smoker   . Smokeless tobacco: Never Used  . Alcohol Use: No   Family History  Problem Relation Age of Onset  . Hypertension Father   . Cancer Father     pancreatic cancer  . Hypertension Sister   . Heart disease Maternal Grandmother 60    MI  . Diabetes Maternal Grandmother   . Heart disease Paternal Grandmother 24    MI  . Diabetes Paternal Grandmother    Allergies  Allergen Reactions  . Cephalexin   . Codeine     REACTION: ? reaction  . Duloxetine   . Hydrocodone     REACTION: ? reaction  . Levaquin [Levofloxacin] Nausea Only  . Metformin And Related Diarrhea    GI side effects   . Prednisone     Tolerates intraarticular depo-medrol.  . Quetiapine   . Sertraline Hcl     REACTION: vivid dreams  . Triazolam     REACTION: ? reaction  . Zaleplon   . Zocor [Simvastatin - High Dose]     achey  . Zolpidem Tartrate     hallucinations  . Norelgestromin-Eth Estradiol     Patch didn't stick to skin   Current Outpatient Prescriptions on File Prior to Visit  Medication Sig Dispense Refill  . ACCU-CHEK AVIVA PLUS test strip     . ACCU-CHEK SOFTCLIX LANCETS lancets     . acetaminophen (TYLENOL) 500 MG tablet Take 500 mg by mouth every 6 (six) hours as needed.     Marland Kitchen albuterol (PROVENTIL HFA;VENTOLIN HFA) 108 (90 BASE) MCG/ACT inhaler Inhale 2 puffs into the lungs every 4 (four) hours as needed for wheezing. 1 Inhaler 3  . allopurinol (ZYLOPRIM) 100 MG tablet Take 1 tablet (100 mg total) by mouth daily. 30 tablet 11  . ALPRAZolam (XANAX) 1 MG tablet Take 2 mg by mouth 3 (three) times daily as needed.     . benzonatate (TESSALON) 200 MG capsule TAKE ONE  CAPSULE BY MOUTH THREE TIMES DAILY AS NEEDED FOR COUGH (Patient taking differently: TAKE ONE CAPSULE BY MOUTH AT BEDTIME AS NEEDED FOR COUGH) 90 capsule 3  . Blood Glucose Calibration (ACCU-CHEK AVIVA) SOLN     . buPROPion (WELLBUTRIN XL)  150 MG 24 hr tablet Take 300 mg by mouth every morning.     . calcium carbonate (TUMS - DOSED IN MG ELEMENTAL CALCIUM) 500 MG chewable tablet Chew 1 tablet by mouth as needed for indigestion.     . cyanocobalamin (,VITAMIN B-12,) 1000 MCG/ML injection Inject 1 mL (1,000 mcg total) into the muscle every 8 (eight) weeks. 1 mL 3  . cyclobenzaprine (FLEXERIL) 5 MG tablet TAKE ONE TABLET THREE TIMES A DAY AS NEEDED FOR MUSCLE SPASM 30 tablet 2  . Dulaglutide (TRULICITY) 1.5 0000000 SOPN Inject contents of one pen once a week 4 pen 3  . fenofibrate (TRICOR) 48 MG tablet Take 1 tablet (48 mg total) by mouth daily. 30 tablet 11  . FLUoxetine (PROZAC) 40 MG capsule Take 80 mg by mouth daily.     . furosemide (LASIX) 20 MG tablet Take 1 tablet (20 mg total) by mouth daily. Prn edema 30 tablet 3  . gabapentin (NEURONTIN) 300 MG capsule Take 1 capsule (300 mg total) by mouth 2 (two) times daily. 60 capsule 5  . glipiZIDE (GLIPIZIDE XL) 5 MG 24 hr tablet TAKE 1 TABLET ONCE DAILY WITH BREAKFAST 30 tablet 5  . norethindrone-ethinyl estradiol (JUNEL FE,GILDESS FE,LOESTRIN FE) 1-20 MG-MCG tablet Take 1 tablet by mouth daily.    Marland Kitchen omeprazole (PRILOSEC) 20 MG capsule TAKE ONE CAPSULE TWICE A DAY 60 capsule 3  . oxybutynin (DITROPAN XL) 15 MG 24 hr tablet Take 1 tablet (15 mg total) by mouth daily. 90 tablet 3  . spironolactone (ALDACTONE) 100 MG tablet Take 100 mg by mouth daily.     Marland Kitchen talc (ZEASORB) powder Apply topically as needed. (Patient taking differently: Apply 1 application topically daily as needed (sweat). ) 240 g 3  . traMADol (ULTRAM) 50 MG tablet TAKE ONE TABLET EVERY EIGHT HOURS AS NEEDED FOR PAIN 60 tablet 1  . traZODone (DESYREL) 150 MG tablet Take 450 mg by mouth  at bedtime.      No current facility-administered medications on file prior to visit.    Review of Systems Review of Systems  Constitutional: Negative for fever, appetite change,  and unexpected weight change.  Eyes: Negative for pain and visual disturbance.  ENt pos for sinus pain/congestion  Respiratory: Negative for wheeze and shortness of breath.  pos for cough  Cardiovascular: Negative for cp or palpitations    Gastrointestinal: Negative for nausea, diarrhea and constipation.  Genitourinary: pos  for urgency and frequency. pos for incontinence/ pos for vaginal itching  Skin: Negative for pallor and pos for itching  Neurological: Negative for weakness, light-headedness, numbness and headaches.  Hematological: Negative for adenopathy. Does not bruise/bleed easily.  Psychiatric/Behavioral: pos for uncontrolled anxiety and depression, neg for SI         Objective:   Physical Exam  Constitutional: She appears well-developed and well-nourished. No distress.  Morbidly obese and anxious appearing   HENT:  Head: Normocephalic and atraumatic.  Right Ear: External ear normal.  Left Ear: External ear normal.  Mouth/Throat: Oropharynx is clear and moist. No oropharyngeal exudate.  Nares are injected and congested  Bilateral maxillary sinus tenderness  Post nasal drip   Eyes: Conjunctivae and EOM are normal. Pupils are equal, round, and reactive to light. Right eye exhibits no discharge. Left eye exhibits no discharge.  Neck: Normal range of motion. Neck supple.  Cardiovascular: Normal rate, regular rhythm and normal heart sounds.   Pulmonary/Chest: Effort normal and breath sounds normal. No respiratory distress. She has no  wheezes. She has no rales.  Abdominal: Soft. Bowel sounds are normal. She exhibits no distension. There is tenderness. There is no rebound.  No cva tenderness  Mild suprapubic tenderness  Genitourinary:  Intertrigo is mild - some odor of skin under pannus    Mildly  injected vaginal mucosa  No vaginal discharge or lesions   Musculoskeletal: She exhibits tenderness. She exhibits no edema.  LS and R lumbar musculature are tender  Poor rom spine  Needs help getting on and off the table   Neg SLR  Lymphadenopathy:    She has no cervical adenopathy.  Neurological: She is alert. She has normal reflexes. No cranial nerve deficit. She exhibits normal muscle tone. Coordination normal.  Skin: Skin is warm and dry. No rash noted.  Psychiatric:  Anxious and depressed  Tearful at times           Assessment & Plan:   Problem List Items Addressed This Visit      Respiratory   Acute sinusitis    Pt has some medicine intolerances Cover with zpak Disc symptomatic care - see instructions on AVS  Update if not starting to improve in a week or if worsening        Relevant Medications   azithromycin (ZITHROMAX Z-PAK) 250 MG tablet   fluconazole (DIFLUCAN) 150 MG tablet     Genitourinary   UTI (urinary tract infection)    Borderline ua  Difficult to get a clean catch  Sent for cx       Relevant Medications   azithromycin (ZITHROMAX Z-PAK) 250 MG tablet   fluconazole (DIFLUCAN) 150 MG tablet   Yeast vaginitis    With pos wet prep  tx with diflucan  Disc cleansing and staying dry - challenge with morbid obesity  Update if not starting to improve in a week or if worsening        Relevant Medications   azithromycin (ZITHROMAX Z-PAK) 250 MG tablet   fluconazole (DIFLUCAN) 150 MG tablet   Other Relevant Orders   POCT Wet Prep [employer]) (Completed)     Other   Back pain - Primary    Suspect musculoskeletal in morbidly obese pt  Did send urine for culture  Will continue chiropractor treatment and stretching as tolerated  Consider specialist eval if needed       Relevant Orders   POCT urinalysis dipstick (Completed)   Urine frequency    Likely overactive bladder or poor glucose control  Borderline ua Sent for cx        Relevant Orders   Urine culture (Completed)

## 2014-12-28 NOTE — Assessment & Plan Note (Signed)
Pt has some medicine intolerances Cover with zpak Disc symptomatic care - see instructions on AVS  Update if not starting to improve in a week or if worsening

## 2014-12-28 NOTE — Patient Instructions (Addendum)
Take antibiotic - zpak (sinuses and urine) We will culture urine to see if it grows bacteria  You have to drink more fluids-you are dehydrated  For vaginal yeast infection -take diflucan as directed   Keep checking in with your chiropractor about back pain

## 2014-12-29 LAB — URINE CULTURE
Colony Count: NO GROWTH
ORGANISM ID, BACTERIA: NO GROWTH

## 2014-12-30 LAB — POCT WET PREP (WET MOUNT): KOH WET PREP POC: NEGATIVE

## 2014-12-30 NOTE — Assessment & Plan Note (Signed)
Borderline ua  Difficult to get a clean catch  Sent for cx

## 2014-12-30 NOTE — Patient Instructions (Signed)
Plan:  Aim for 3 Carb Choices per meal (45 grams) +/- 1 either way  Aim for 0-1 Carbs per snack if hungry  Include protein in moderation with your meals and snacks Continue reading food labels for Total Carbohydrate of foods Every 15 grams of carbohydrate = 1 Carb Choice and  Every 5 grams of fat is 1 Fat Choice Aim for 2300 mg Sodium per day  Aim for 600 mg or less / meal  Aim for 200 mg or less/snack Continue looking for new recipes to try, good job Consider options for an activity every day. Arm chair exercises at home would be terrific! 

## 2014-12-30 NOTE — Assessment & Plan Note (Signed)
Likely overactive bladder or poor glucose control  Borderline ua Sent for cx

## 2014-12-30 NOTE — Assessment & Plan Note (Signed)
Suspect musculoskeletal in morbidly obese pt  Did send urine for culture  Will continue chiropractor treatment and stretching as tolerated  Consider specialist eval if needed

## 2014-12-30 NOTE — Assessment & Plan Note (Signed)
With pos wet prep  tx with diflucan  Disc cleansing and staying dry - challenge with morbid obesity  Update if not starting to improve in a week or if worsening

## 2015-01-12 ENCOUNTER — Emergency Department: Payer: Medicare PPO

## 2015-01-12 ENCOUNTER — Emergency Department
Admission: EM | Admit: 2015-01-12 | Discharge: 2015-01-12 | Disposition: A | Discharge status: Home / Self Care | Payer: Medicare PPO | Attending: Emergency Medicine | Admitting: Emergency Medicine

## 2015-01-12 DIAGNOSIS — T148XXA Other injury of unspecified body region, initial encounter: Secondary | ICD-10-CM

## 2015-01-12 DIAGNOSIS — M25572 Pain in left ankle and joints of left foot: Secondary | ICD-10-CM | POA: Diagnosis not present

## 2015-01-12 DIAGNOSIS — S80812A Abrasion, left lower leg, initial encounter: Secondary | ICD-10-CM | POA: Insufficient documentation

## 2015-01-12 DIAGNOSIS — S82435A Nondisplaced oblique fracture of shaft of left fibula, initial encounter for closed fracture: Secondary | ICD-10-CM | POA: Diagnosis not present

## 2015-01-12 DIAGNOSIS — S40811A Abrasion of right upper arm, initial encounter: Secondary | ICD-10-CM | POA: Insufficient documentation

## 2015-01-12 DIAGNOSIS — W19XXXA Unspecified fall, initial encounter: Secondary | ICD-10-CM

## 2015-01-12 DIAGNOSIS — S5011XA Contusion of right forearm, initial encounter: Secondary | ICD-10-CM | POA: Diagnosis not present

## 2015-01-12 DIAGNOSIS — S50811A Abrasion of right forearm, initial encounter: Secondary | ICD-10-CM | POA: Insufficient documentation

## 2015-01-12 DIAGNOSIS — S3992XA Unspecified injury of lower back, initial encounter: Secondary | ICD-10-CM | POA: Diagnosis not present

## 2015-01-12 DIAGNOSIS — Z23 Encounter for immunization: Secondary | ICD-10-CM | POA: Insufficient documentation

## 2015-01-12 DIAGNOSIS — Y92009 Unspecified place in unspecified non-institutional (private) residence as the place of occurrence of the external cause: Secondary | ICD-10-CM | POA: Insufficient documentation

## 2015-01-12 DIAGNOSIS — M545 Low back pain: Secondary | ICD-10-CM | POA: Diagnosis not present

## 2015-01-12 DIAGNOSIS — S92354A Nondisplaced fracture of fifth metatarsal bone, right foot, initial encounter for closed fracture: Secondary | ICD-10-CM | POA: Insufficient documentation

## 2015-01-12 DIAGNOSIS — Y998 Other external cause status: Secondary | ICD-10-CM | POA: Diagnosis not present

## 2015-01-12 DIAGNOSIS — S80811A Abrasion, right lower leg, initial encounter: Secondary | ICD-10-CM | POA: Diagnosis not present

## 2015-01-12 DIAGNOSIS — S8011XA Contusion of right lower leg, initial encounter: Secondary | ICD-10-CM | POA: Insufficient documentation

## 2015-01-12 DIAGNOSIS — S8012XA Contusion of left lower leg, initial encounter: Secondary | ICD-10-CM | POA: Diagnosis not present

## 2015-01-12 DIAGNOSIS — E1140 Type 2 diabetes mellitus with diabetic neuropathy, unspecified: Secondary | ICD-10-CM | POA: Insufficient documentation

## 2015-01-12 DIAGNOSIS — Y9301 Activity, walking, marching and hiking: Secondary | ICD-10-CM | POA: Insufficient documentation

## 2015-01-12 DIAGNOSIS — S8261XA Displaced fracture of lateral malleolus of right fibula, initial encounter for closed fracture: Secondary | ICD-10-CM | POA: Diagnosis not present

## 2015-01-12 DIAGNOSIS — S92351A Displaced fracture of fifth metatarsal bone, right foot, initial encounter for closed fracture: Secondary | ICD-10-CM | POA: Diagnosis not present

## 2015-01-12 DIAGNOSIS — S82402A Unspecified fracture of shaft of left fibula, initial encounter for closed fracture: Secondary | ICD-10-CM

## 2015-01-12 DIAGNOSIS — W108XXA Fall (on) (from) other stairs and steps, initial encounter: Secondary | ICD-10-CM | POA: Diagnosis not present

## 2015-01-12 DIAGNOSIS — S92301A Fracture of unspecified metatarsal bone(s), right foot, initial encounter for closed fracture: Secondary | ICD-10-CM

## 2015-01-12 DIAGNOSIS — S99912A Unspecified injury of left ankle, initial encounter: Secondary | ICD-10-CM | POA: Diagnosis present

## 2015-01-12 DIAGNOSIS — S40021A Contusion of right upper arm, initial encounter: Secondary | ICD-10-CM | POA: Insufficient documentation

## 2015-01-12 DIAGNOSIS — S82434A Nondisplaced oblique fracture of shaft of right fibula, initial encounter for closed fracture: Secondary | ICD-10-CM | POA: Diagnosis not present

## 2015-01-12 MED ORDER — OXYCODONE-ACETAMINOPHEN 5-325 MG PO TABS
2.0000 | ORAL_TABLET | Freq: Once | ORAL | Status: AC
  Administered 2015-01-12: 2 via ORAL
  Filled 2015-01-12: qty 2

## 2015-01-12 MED ORDER — OXYCODONE-ACETAMINOPHEN 5-325 MG PO TABS: 2.0000 | ORAL_TABLET | Freq: Four times a day (QID) | ORAL | Status: DC | PRN

## 2015-01-12 MED ORDER — TETANUS-DIPHTH-ACELL PERTUSSIS 5-2.5-18.5 LF-MCG/0.5 IM SUSP
0.5000 mL | Freq: Once | INTRAMUSCULAR | Status: AC
  Administered 2015-01-12: 0.5 mL via INTRAMUSCULAR
  Filled 2015-01-12: qty 0.5

## 2015-01-12 MED ORDER — DIPHENHYDRAMINE HCL 25 MG PO CAPS
25.0000 mg | ORAL_CAPSULE | Freq: Once | ORAL | Status: AC
  Administered 2015-01-12: 25 mg via ORAL
  Filled 2015-01-12: qty 1

## 2015-01-12 NOTE — ED Notes (Signed)
Pt fall down approx 4-5 stairs PTA. Pt believes that she tripped and fell. Pt did hit her head but is c/o of left leg/knee/ankle. Abrasions to bilateral forearms. Abrasion to right forehead, approx 1inX1in. Pt denies LOC. Pt unable to bear weight to left side. Pt alert and oriented X4, active, cooperative, pt in NAD. RR even and unlabored, color WNL.

## 2015-01-12 NOTE — ED Provider Notes (Signed)
[facility] Emergency Department Provider Note     Time seen: ----------------------------------------- 4:59 PM on 01/12/2015 -----------------------------------------    I have reviewed the triage vital signs and the nursing notes.   HISTORY  Chief Complaint Fall    HPI Mackenzie Bonilla is a 49 y.o. female who presents to ER after a fall. Patient states she was walking on 6-7 bricks stepsoutside of her home in her feet got tangled up and she fell on top of them onto a brick landing. She reports hitting her head, denies any headache as well as low back pain, Main complaint is left ankle pain. She is unable to walk due to the severe left ankle pain and swelling.   Past Medical History  Diagnosis Date  . Allergy     allergic rhinitis  . Depression   . Hyperlipidemia   . Arthritis     osteoarthritis  . Obesity   . Lymphedema   . Tachycardia   . Neuromuscular disorder (HCC)     fibromyalgia  . Hyperhidrosis   . GERD (gastroesophageal reflux disease)     EGD negative 06/2001// EGD erythematous mucosa, polyp 08/2008  . Fatty liver 07/2008    abd. ultrasound - fatty liver ; slt dilated cbd (no stones ) 06/10// abd.  ultrasound normal on 04/2006  . Diabetes mellitus without complication ([place])   . Fibromyalgia   . Shortness of breath dyspnea   . Asthma   . Kidney stones   . UTI (lower urinary tract infection)   . High uric acid in 24 hour urine specimen   . Anemia   . TMJ (dislocation of temporomandibular joint)   . Anxiety   . Claustrophobia     does not like oxygen mask covering face    Patient Active Problem List   Diagnosis Date Noted  . Acute sinusitis 12/28/2014  . Yeast vaginitis 12/28/2014  . Urine frequency 12/28/2014  . Back pain 12/28/2014  . Hypertriglyceridemia 10/14/2014  . Breast mass in female 07/28/2014  . UTI (urinary tract infection) 03/02/2014  . Right otitis media 10/21/2013  . Ingrown toenail 08/11/2013  .  Right knee pain 07/23/2013  . Fall 07/23/2013  . Insect bite 06/05/2013  . Great toe pain 05/12/2013  . Medication management 11/20/2012  . Abdominal pain, unspecified site 04/23/2012  . Uric acid kidney stone 11/20/2011  . HYPERHIDROSIS 11/07/2009  . HOT FLASHES 06/17/2009  . Type 2 diabetes, uncontrolled, with neuropathy ([place]) 08/23/2008  . VITAMIN D DEFICIENCY 05/07/2008  . VITAMIN B12 DEFICIENCY 06/04/2007  . CHRONIC FATIGUE SYNDROME 11/15/2006  . DIVERTICULITIS, HX OF 11/15/2006  . POLYCYSTIC OVARIAN DISEASE 10/16/2006  . Hyperlipidemia LDL goal <100 10/16/2006  . Morbid obesity with body mass index of 60.0-69.9 in adult ([place]) 10/16/2006  . ANXIETY 10/16/2006  . PANIC DISORDER 10/16/2006  . BULIMIA 10/16/2006  . Depression 10/16/2006  . CARPAL TUNNEL SYNDROME 10/16/2006  . LYMPHEDEMA 10/16/2006  . ALLERGIC RHINITIS 10/16/2006  . ASTHMA, MILD, INTERMITTENT 10/16/2006  . TMJ SYNDROME 10/16/2006  . GERD 10/16/2006  . IRRITABLE BOWEL SYNDROME 10/16/2006  . OSTEOARTHRITIS 10/16/2006  . Myalgia and myositis 10/16/2006  . COUGH, CHRONIC 10/16/2006    Past Surgical History  Procedure Laterality Date  . Tonsillectomy    . Septoplasty      two times  . Foot surgery    . Uterine tumor  09/2001  . Vagus nerve stimulator insertion    . Endometrial biopsy  01/2004  . Uterine fibroid surgery  06/2005    ablation  . Colonoscopy    . Esophagogastroduodenoscopy    . Breast lumpectomy with radioactive seed localization Right 08/02/2014    Procedure: BREAST LUMPECTOMY WITH RADIOACTIVE SEED LOCALIZATION;  Surgeon: Rolm Bookbinder, MD;  Location: [facility];  Service: General;  Laterality: Right;    Allergies Cephalexin; Codeine; Duloxetine; Hydrocodone; Levaquin; Metformin and related; Prednisone; Quetiapine; Sertraline hcl; Triazolam; Zaleplon; Zocor; Zolpidem tartrate; and Norelgestromin-eth estradiol  Social History Social History  Substance Use Topics  . Smoking status: Never  Smoker   . Smokeless tobacco: Never Used  . Alcohol Use: No    Review of Systems Constitutional: Negative for fever. Eyes: Negative for visual changes. ENT: Negative for sore throat. Cardiovascular: Negative for chest pain. Respiratory: Negative for shortness of breath. Gastrointestinal: Negative for abdominal pain, vomiting and diarrhea. Genitourinary: Negative for dysuria. Musculoskeletal: Positive for back pain and left ankle pain Skin: Negative for rash. Neurological: Negative for headaches, focal weakness or numbness.  10-point ROS otherwise negative.  ____________________________________________   PHYSICAL EXAM:  VITAL SIGNS: ED Triage Vitals  Enc Vitals Group     BP 01/12/15 1524 159/79 mmHg     Pulse Rate 01/12/15 1524 95     Resp 01/12/15 1524 16     Temp 01/12/15 1524 98 F (36.7 C)     Temp src --      SpO2 01/12/15 1524 95 %     Weight 01/12/15 1524 330 lb (149.687 kg)     Height 01/12/15 1524 5\' 4"  (1.626 m)     Head Cir --      Peak Flow --      Pain Score 01/12/15 1524 10     Pain Loc --      Pain Edu? --      Excl. in [place]? --     Constitutional: Alert and oriented. Well appearing and in no distress. Eyes: Conjunctivae are normal. PERRL. Normal extraocular movements. ENT   Head: Normocephalic and atraumatic.   Nose: No congestion/rhinnorhea.   Mouth/Throat: Mucous membranes are moist.   Neck: No stridor. Cardiovascular: Normal rate, regular rhythm. Normal and symmetric distal pulses are present in all extremities. No murmurs, rubs, or gallops. Respiratory: Normal respiratory effort without tachypnea nor retractions. Breath sounds are clear and equal bilaterally. No wheezes/rales/rhonchi. Gastrointestinal: Soft and nontender. No distention. No abdominal bruits.  Musculoskeletal:  Markedly ankle swe and pain with range of motion of the left ankle. Scattered abrasions and contusions are present over the upper and lower extremity is. Most  notably on the right upper forearm  Neurologic:  Normal speech and language. No gross focal neurologic deficits are appreciated. Speech is normal.  Skin:  Skin is warm, dry and intact. No rash noted. Psychiatric: Mood and affect are normal. Speech and behavior are normal. Patient exhibits appropriate insight and judgment. ____________________________________________  ED COURSE:  Pertinent labs & imaging results that were available during my care of the patient were reviewed by me and considered in my medical decision making (see chart for details).  patient is in no distress, will obtain ankle x-rays and reevaluate.   RADIOLOGY Images were viewed by me  Left ankle x-rays, right ankle x-rays, lumbar spine x-rays IMPRESSION: Oblique nondisplaced fracture of the distal fibular diaphysis.  IMPRESSION: Negative lumbar spine radiographs. MPRESSION: Acute nondisplaced fracture right fifth metatarsal base.  FINAL ASSESSMENT AND PLAN:  Impression: Fall, abrasion, metatarsal fracture, fibular fracture.   Plan: Patient with labs and imaging as dictated above. Patient will  have a short leg splint on the left and a cast shoe on the right. She was given a walker to try to ambulate with. I have discussed with Dr. Rudene Christians who will follow up with her in the clinic.   Earleen Newport, MD   Earleen Newport, MD 01/12/15 [phone]

## 2015-01-12 NOTE — ED Notes (Signed)
Patient discharged to home per MD order. Patient in stable condition, and deemed medically cleared by ED provider for discharge. Discharge instructions reviewed with patient/family using "Teach Back"; verbalized understanding of medication education and administration, and information about follow-up care. Denies further concerns. ° °

## 2015-01-12 NOTE — Discharge Instructions (Signed)
Fibular Ankle Fracture Treated With or Without Immobilization, Adult A fibular fracture at your ankle is a break (fracture) bone in the smallest of the two bones in your lower leg, located on the outside of your leg (fibula) close to the area at your ankle joint. CAUSES  Rolling your ankle.  Twisting your ankle.  Extreme flexing or extending of your foot.  Severe force on your ankle as when falling from a distance. RISK FACTORS  Jumping activities.  Participation in sports.  Osteoporosis.  Advanced age.  Previous ankle injuries. SIGNS AND SYMPTOMS  Pain.  Swelling.  Inability to put weight on injured ankle.  Bruising.  Bone deformities at site of injury. DIAGNOSIS  This fracture is diagnosed with the help of an X-ray exam. TREATMENT  If the fractured bone did not move out of place it usually will heal without problems and does casting or splinting. If immobilization is needed for comfort or the fractured bone moved out of place and will not heal properly with immobilization, a cast or splint will be used. HOME CARE INSTRUCTIONS   Apply ice to the area of injury:  Put ice in a plastic bag.  Place a towel between your skin and the bag.  Leave the ice on for 20 minutes, 2-3 times a day.  Use crutches as directed. Resume walking without crutches as directed by your health care provider.  Only take over-the-counter or prescription medicines for pain, discomfort, or fever as directed by your health care provider.  If you have a removable splint or boot, do not remove the boot unless directed by your health care provider. SEEK MEDICAL CARE IF:   You have continued pain or more swelling  The medications do not control the pain. SEEK IMMEDIATE MEDICAL CARE IF:  You develop severe pain in the leg or foot.  Your skin or nails below the injury turn blue or grey or feel cold or numb. MAKE SURE YOU:   Understand these instructions.  Will watch your  condition.  Will get help right away if you are not doing well or get worse.   This information is not intended to replace advice given to you by your health care provider. Make sure you discuss any questions you have with your health care provider.   Document Released: 01/29/2005 Document Revised: 02/19/2014 Document Reviewed: 09/10/2012 Elsevier Interactive Patient Education 2016 Elsevier Inc.  Metatarsal Fracture A metatarsal fracture is a break in a metatarsal bone. Metatarsal bones connect your toe bones to your ankle bones. CAUSES This type of fracture may be caused by:  A sudden twisting of your foot.  A fall onto your foot.  Overuse or repetitive exercise. RISK FACTORS This condition is more likely to develop in people who:  Play contact sports.  Have a bone disease.  Have a low calcium level. SYMPTOMS Symptoms of this condition include:  Pain that is worse when walking or standing.  Pain when pressing on the foot or moving the toes.  Swelling.  Bruising on the top or bottom of the foot.  A foot that appears shorter than the other one. DIAGNOSIS This condition is diagnosed with a physical exam. You may also have imaging tests, such as:  X-rays.  A CT scan.  MRI. TREATMENT Treatment for this condition depends on its severity and whether a bone has moved out of place. Treatment may involve:  Rest.  Wearing foot support such as a cast, splint, or boot for several weeks.  Using crutches.  Surgery to move bones back into the right position. Surgery is usually needed if there are many pieces of broken bone or bones that are very out of place (displaced fracture).  Physical therapy. This may be needed to help you regain full movement and strength in your foot. You will need to return to your health care provider to have X-rays taken until your bones heal. Your health care provider will look at the X-rays to make sure that your foot is healing well. HOME  CARE INSTRUCTIONS  If You Have a Cast:  Do not stick anything inside the cast to scratch your skin. Doing that increases your risk of infection.  Check the skin around the cast every day. Report any concerns to your health care provider. You may put lotion on dry skin around the edges of the cast. Do not apply lotion to the skin underneath the cast.  Keep the cast clean and dry. If You Have a Splint or a Supportive Boot:  Wear it as directed by your health care provider. Remove it only as directed by your health care provider.  Loosen it if your toes become numb and tingle, or if they turn cold and blue.  Keep it clean and dry. Bathing  Do not take baths, swim, or use a hot tub until your health care provider approves. Ask your health care provider if you can take showers. You may only be allowed to take sponge baths for bathing.  If your health care provider approves bathing and showering, cover the cast or splint with a watertight plastic bag to protect it from water. Do not let the cast or splint get wet. Managing Pain, Stiffness, and Swelling  If directed, apply ice to the injured area (if you have a splint, not a cast).  Put ice in a plastic bag.  Place a towel between your skin and the bag.  Leave the ice on for 20 minutes, 2-3 times per day.  Move your toes often to avoid stiffness and to lessen swelling.  Raise (elevate) the injured area above the level of your heart while you are sitting or lying down. Driving  Do not drive or operate heavy machinery while taking pain medicine.  Do not drive while wearing foot support on a foot that you use for driving. Activity  Return to your normal activities as directed by your health care provider. Ask your health care provider what activities are safe for you.  Perform exercises as directed by your health care provider or physical therapist. Safety  Do not use the injured foot to support your body weight until your health  care provider says that you can. Use crutches as directed by your health care provider. General Instructions  Do not put pressure on any part of the cast or splint until it is fully hardened. This may take several hours.  Do not use any tobacco products, including cigarettes, chewing tobacco, or e-cigarettes. Tobacco can delay bone healing. If you need help quitting, ask your health care provider.  Take medicines only as directed by your health care provider.  Keep all follow-up visits as directed by your health care provider. This is important. SEEK MEDICAL CARE IF:  You have a fever.  Your cast, splint, or boot is too loose or too tight.  Your cast, splint, or boot is damaged.  Your pain medicine is not helping.  You have pain, tingling, or numbness in your foot that is not going away. [place]  CARE IF:  You have severe pain.  You have tingling or numbness in your foot that is getting worse.  Your foot feels cold or becomes numb.  Your foot changes color.   This information is not intended to replace advice given to you by your health care provider. Make sure you discuss any questions you have with your health care provider.   Document Released: 10/21/2001 Document Revised: 06/15/2014 Document Reviewed: 11/25/2013 Elsevier Interactive Patient Education [employer].

## 2015-01-12 NOTE — ED Notes (Signed)
Patient comes in via EMS from home for fall.  Patient was walking down 6-7 brick steps outside of home. Patient unsure how she fell as she denies tripping or dizziness with fall.  Patient reports hitting head on brick.  Patient denies headache, but complains of lower back pain.  No obvious deformity noted.  Patient alert and oriented.

## 2015-01-14 ENCOUNTER — Ambulatory Visit (INDEPENDENT_AMBULATORY_CARE_PROVIDER_SITE_OTHER): Payer: Medicare PPO | Admitting: Sports Medicine

## 2015-01-14 ENCOUNTER — Encounter: Payer: Self-pay | Admitting: Sports Medicine

## 2015-01-14 DIAGNOSIS — M25579 Pain in unspecified ankle and joints of unspecified foot: Secondary | ICD-10-CM

## 2015-01-14 DIAGNOSIS — S93409A Sprain of unspecified ligament of unspecified ankle, initial encounter: Secondary | ICD-10-CM

## 2015-01-14 DIAGNOSIS — S92301A Fracture of unspecified metatarsal bone(s), right foot, initial encounter for closed fracture: Secondary | ICD-10-CM | POA: Diagnosis not present

## 2015-01-14 DIAGNOSIS — M79671 Pain in right foot: Secondary | ICD-10-CM

## 2015-01-14 DIAGNOSIS — M79672 Pain in left foot: Secondary | ICD-10-CM | POA: Diagnosis not present

## 2015-01-14 DIAGNOSIS — S82402A Unspecified fracture of shaft of left fibula, initial encounter for closed fracture: Secondary | ICD-10-CM | POA: Diagnosis not present

## 2015-01-14 DIAGNOSIS — E1142 Type 2 diabetes mellitus with diabetic polyneuropathy: Secondary | ICD-10-CM

## 2015-01-14 MED ORDER — IBUPROFEN 600 MG PO TABS: 600.0000 mg | ORAL_TABLET | Freq: Three times a day (TID) | ORAL | Status: DC | PRN

## 2015-01-14 MED ORDER — HYDROMORPHONE HCL 4 MG PO TABS: 4.0000 mg | ORAL_TABLET | ORAL | Status: DC | PRN

## 2015-01-14 MED ORDER — OXYCODONE-ACETAMINOPHEN 7.5-325 MG PO TABS: 1.0000 | ORAL_TABLET | ORAL | Status: DC | PRN

## 2015-01-15 NOTE — Progress Notes (Addendum)
Patient ID: KANOE WANNER, female   DOB: Jun 13, 1965, 49 y.o.   MRN: [id number] Subjective: KATLYNE NISHIDA is a 49 y.o. type 2 diabetic female patient who presents to office for evaluation of bilateral foot pain. Patient states that she injured both feet/ankles when she tripped and fell on steps on 11/30 states that her legs folded like a pretzel and she was unable to get up; she yelled for help and her neighbors called the ambulance. Patient reports that she was rushed to ER where it was determined that she had fractures on her right foot and ankle. Patient reports that she is in a lot of pain and that she wants to understand what is exactly broken. Patient is assisted by mom at this visit who desires assistance for daughter with home nursing or rehab placement. Patient states that with fractures on both sides she will have a hard time feeding/cooking, getting dressed, and taking care of herself. Patient denies any other pedal complaints or concerns.   FBS 100   Patient Active Problem List   Diagnosis Date Noted  . Acute sinusitis 12/28/2014  . Yeast vaginitis 12/28/2014  . Urine frequency 12/28/2014  . Back pain 12/28/2014  . Hypertriglyceridemia 10/14/2014  . Breast mass in female 07/28/2014  . UTI (urinary tract infection) 03/02/2014  . Right otitis media 10/21/2013  . Ingrown toenail 08/11/2013  . Right knee pain 07/23/2013  . Fall 07/23/2013  . Insect bite 06/05/2013  . Great toe pain 05/12/2013  . Medication management 11/20/2012  . Abdominal pain, unspecified site 04/23/2012  . Uric acid kidney stone 11/20/2011  . HYPERHIDROSIS 11/07/2009  . HOT FLASHES 06/17/2009  . Type 2 diabetes, uncontrolled, with neuropathy ([place]) 08/23/2008  . VITAMIN D DEFICIENCY 05/07/2008  . VITAMIN B12 DEFICIENCY 06/04/2007  . CHRONIC FATIGUE SYNDROME 11/15/2006  . DIVERTICULITIS, HX OF 11/15/2006  . POLYCYSTIC OVARIAN DISEASE 10/16/2006  . Hyperlipidemia LDL goal <100 10/16/2006  . Morbid  obesity with body mass index of 60.0-69.9 in adult ([place]) 10/16/2006  . ANXIETY 10/16/2006  . PANIC DISORDER 10/16/2006  . BULIMIA 10/16/2006  . Depression 10/16/2006  . CARPAL TUNNEL SYNDROME 10/16/2006  . LYMPHEDEMA 10/16/2006  . ALLERGIC RHINITIS 10/16/2006  . ASTHMA, MILD, INTERMITTENT 10/16/2006  . TMJ SYNDROME 10/16/2006  . GERD 10/16/2006  . IRRITABLE BOWEL SYNDROME 10/16/2006  . OSTEOARTHRITIS 10/16/2006  . Myalgia and myositis 10/16/2006  . COUGH, CHRONIC 10/16/2006   Current Outpatient Prescriptions on File Prior to Visit  Medication Sig Dispense Refill  . ACCU-CHEK AVIVA PLUS test strip     . ACCU-CHEK SOFTCLIX LANCETS lancets     . acetaminophen (TYLENOL) 500 MG tablet Take 500 mg by mouth every 6 (six) hours as needed.     Marland Kitchen albuterol (PROVENTIL HFA;VENTOLIN HFA) 108 (90 BASE) MCG/ACT inhaler Inhale 2 puffs into the lungs every 4 (four) hours as needed for wheezing. 1 Inhaler 3  . allopurinol (ZYLOPRIM) 100 MG tablet Take 1 tablet (100 mg total) by mouth daily. 30 tablet 11  . ALPRAZolam (XANAX) 1 MG tablet Take 2 mg by mouth 3 (three) times daily as needed.     Marland Kitchen azithromycin (ZITHROMAX Z-PAK) 250 MG tablet Take 2 pills by mouth today and then 1 pill daily for 4 days 6 tablet 0  . benzonatate (TESSALON) 200 MG capsule TAKE ONE CAPSULE BY MOUTH THREE TIMES DAILY AS NEEDED FOR COUGH (Patient taking differently: TAKE ONE CAPSULE BY MOUTH AT BEDTIME AS NEEDED FOR COUGH) 90 capsule 3  .  Blood Glucose Calibration (ACCU-CHEK AVIVA) SOLN     . buPROPion (WELLBUTRIN XL) 150 MG 24 hr tablet Take 300 mg by mouth every morning.     . calcium carbonate (TUMS - DOSED IN MG ELEMENTAL CALCIUM) 500 MG chewable tablet Chew 1 tablet by mouth as needed for indigestion.     . cyanocobalamin (,VITAMIN B-12,) 1000 MCG/ML injection Inject 1 mL (1,000 mcg total) into the muscle every 8 (eight) weeks. 1 mL 3  . cyclobenzaprine (FLEXERIL) 5 MG tablet TAKE ONE TABLET THREE TIMES A DAY AS NEEDED FOR  MUSCLE SPASM 30 tablet 2  . Dulaglutide (TRULICITY) 1.5 VO/5.9YT SOPN Inject contents of one pen once a week 4 pen 3  . fenofibrate (TRICOR) 48 MG tablet Take 1 tablet (48 mg total) by mouth daily. 30 tablet 11  . fluconazole (DIFLUCAN) 150 MG tablet Take one pill by mouth every 3 days 3 tablet 0  . FLUoxetine (PROZAC) 40 MG capsule Take 80 mg by mouth daily.     . furosemide (LASIX) 20 MG tablet Take 1 tablet (20 mg total) by mouth daily. Prn edema 30 tablet 3  . gabapentin (NEURONTIN) 300 MG capsule Take 1 capsule (300 mg total) by mouth 2 (two) times daily. 60 capsule 5  . glipiZIDE (GLIPIZIDE XL) 5 MG 24 hr tablet TAKE 1 TABLET ONCE DAILY WITH BREAKFAST 30 tablet 5  . norethindrone-ethinyl estradiol (JUNEL FE,GILDESS FE,LOESTRIN FE) 1-20 MG-MCG tablet Take 1 tablet by mouth daily.    Marland Kitchen omeprazole (PRILOSEC) 20 MG capsule TAKE ONE CAPSULE TWICE A DAY 60 capsule 3  . oxybutynin (DITROPAN XL) 15 MG 24 hr tablet Take 1 tablet (15 mg total) by mouth daily. 90 tablet 3  . oxyCODONE-acetaminophen (PERCOCET) 5-325 MG tablet Take 2 tablets by mouth every 6 (six) hours as needed for moderate pain or severe pain. 30 tablet 0  . spironolactone (ALDACTONE) 100 MG tablet Take 100 mg by mouth daily.     Marland Kitchen talc (ZEASORB) powder Apply topically as needed. (Patient taking differently: Apply 1 application topically daily as needed (sweat). ) 240 g 3  . traMADol (ULTRAM) 50 MG tablet TAKE ONE TABLET EVERY EIGHT HOURS AS NEEDED FOR PAIN 60 tablet 1  . traZODone (DESYREL) 150 MG tablet Take 450 mg by mouth at bedtime.      No current facility-administered medications on file prior to visit.   Allergies  Allergen Reactions  . Cephalexin   . Codeine     REACTION: ? reaction  . Duloxetine   . Hydrocodone     REACTION: ? reaction  . Levaquin [Levofloxacin] Nausea Only  . Metformin And Related Diarrhea    GI side effects   . Prednisone     Tolerates intraarticular depo-medrol.  . Quetiapine   . Sertraline  Hcl     REACTION: vivid dreams  . Triazolam     REACTION: ? reaction  . Zaleplon   . Zocor [Simvastatin - High Dose]     achey  . Zolpidem Tartrate     hallucinations  . Norelgestromin-Eth Estradiol     Patch didn't stick to skin    Objective:  General: Alert and oriented x3 in acute distress; patient is very emotional and anxious   Dermatology: No open lesions bilateral lower extremities, no webspace macerations, + ecchymosis left lateral foot and ankle and right lateral foot, all nails x 10 are short and mildly dystrophic.  Vascular: Focal edema noted to both feet and ankles. Dorsalis Pedis and  Posterior Tibial pedal pulses 2/4, Capillary Fill Time 3 seconds,(+)scant  pedal hair growth bilateral, Temperature gradient within normal limits.  Neurology: Johney Maine sensation intact via light touch bilateral, Protective sensation diminished  with Thornell Mule Monofilament to all pedal sites  Musculoskeletal: There is tenderness with palpation at left lateral ankle and fibula and right 5th metatarsal base. No pain with calf compression bilateral.  Strength and Range of motion is limited and guarded due to pain bilateral.   Xrays from 01-12-15 reviewed revealing acute nondisplaced fracture of right 5th metatarsal and nondisplaced fracture of fibula on left.   Assessment and Plan: Problem List Items Addressed This Visit    None    Visit Diagnoses    Closed fracture of metatarsal bone, right, initial encounter    -  Primary    5th met, non displaced    Relevant Medications    HYDROmorphone (DILAUDID) 4 MG tablet    Closed fibular fracture, left, initial encounter        nondisplaced    Relevant Medications    HYDROmorphone (DILAUDID) 4 MG tablet    Sprain of ankle, unspecified laterality, initial encounter        Relevant Medications    HYDROmorphone (DILAUDID) 4 MG tablet    Foot pain, bilateral        Relevant Medications    HYDROmorphone (DILAUDID) 4 MG tablet    Ankle pain,  unspecified laterality        Relevant Medications    HYDROmorphone (DILAUDID) 4 MG tablet    Diabetic polyneuropathy associated with type 2 diabetes mellitus (HCC)           -Complete examination performed -Xrays reviewed -Discussed treatement options for fracture; risks, alternatives, and benefits explained. -Due to patient's body habitus the risks associated with complete immobilization in fiberglass casts out weigh the benefits. Discussed cast disease, skin irration, blood clots, mobility issues, etc. -Applied walking sturup cast to left; to keep clean, dry, intact. Patient may bear full weight on left. -Applied CAM walker to patient to wear at all times for right and keep clean, dry, and intact. Patient may bear full weight on right with CAM walker.  -Recommend use of walker to assist with ambulation -Recommend protection, rest, ice, elevation daily to assist with improving symptomology  -Rx Dilaudid for pain and motrin for breakthrough pain -Informed patient will have my nursing staff to work on home care/rehab options in the meantime advised patient that if services are not approved by insurance in a timely manner and that she should consider improving her home conditions or living with relatives or a friend to have the assistance that she requires for daily activities of living. -Patient to return to office in 3 weeks for serial x-rays to assess healing or sooner if condition worsens.  Landis Martins, DPM

## 2015-01-17 ENCOUNTER — Telehealth: Payer: Self-pay | Admitting: Family Medicine

## 2015-01-17 ENCOUNTER — Telehealth: Payer: Self-pay

## 2015-01-17 DIAGNOSIS — E1140 Type 2 diabetes mellitus with diabetic neuropathy, unspecified: Secondary | ICD-10-CM | POA: Diagnosis not present

## 2015-01-17 DIAGNOSIS — Z6841 Body Mass Index (BMI) 40.0 and over, adult: Secondary | ICD-10-CM

## 2015-01-17 DIAGNOSIS — E785 Hyperlipidemia, unspecified: Secondary | ICD-10-CM

## 2015-01-17 DIAGNOSIS — F411 Generalized anxiety disorder: Secondary | ICD-10-CM

## 2015-01-17 DIAGNOSIS — I890 Lymphedema, not elsewhere classified: Secondary | ICD-10-CM

## 2015-01-17 DIAGNOSIS — S82402A Unspecified fracture of shaft of left fibula, initial encounter for closed fracture: Secondary | ICD-10-CM | POA: Diagnosis not present

## 2015-01-17 DIAGNOSIS — E1165 Type 2 diabetes mellitus with hyperglycemia: Secondary | ICD-10-CM | POA: Diagnosis not present

## 2015-01-17 DIAGNOSIS — R5382 Chronic fatigue, unspecified: Secondary | ICD-10-CM

## 2015-01-17 DIAGNOSIS — S92301A Fracture of unspecified metatarsal bone(s), right foot, initial encounter for closed fracture: Secondary | ICD-10-CM | POA: Diagnosis not present

## 2015-01-17 DIAGNOSIS — E538 Deficiency of other specified B group vitamins: Secondary | ICD-10-CM

## 2015-01-17 DIAGNOSIS — F329 Major depressive disorder, single episode, unspecified: Secondary | ICD-10-CM

## 2015-01-17 NOTE — Telephone Encounter (Signed)
Spoke with pt's mother and I advise them that we don't have Fl2 forms and that they get them from the facility they are trying to get pt in to, mother verbalized understanding and will drop off form

## 2015-01-17 NOTE — Telephone Encounter (Signed)
Mother notified form ready for pick-up

## 2015-01-17 NOTE — Telephone Encounter (Signed)
I filled it out  Do not know what to put for rec level of care (you may have to contact her care manager)-it is rehab  Did the best I could with meds -without changes from what is in the chart  Thanks In IN box

## 2015-01-17 NOTE — Telephone Encounter (Signed)
Mom called for daughter - they need an fl2 filled out. Mom has found a facility that has a bed . The name of it is [facility] and they have an opening.  Please call mom back at [phone] Thank you

## 2015-01-17 NOTE — Telephone Encounter (Signed)
Pt's mother dropped off the fl2 form to be filled out. She dropped off 2 in the event there was a mistake made on the first one. She asked if we could get this done today as the facility has a bed ready today. The best number to contact Pat at is [phone] (cell phone). Gave ppw to [employer].

## 2015-01-17 NOTE — Telephone Encounter (Signed)
Pt has fx both ankles; pt saw Landis Martins podiatrist 01/14/15(see note in pts chart); pt wants to talk with Dr Glori Bickers about her meds; pt is taking Dilaudid and is not sure if should take with Trazodone. Pt also confused basically about all meds; pt said she does not know what meds to take. Pt also thinks she may need to be in rehab facility.podiatrist office is supposed to be cking on home health or rehab facility but may be end of week before can get answers and pt cannot wait that long. Pt wants to know what facility Dr Glori Bickers would recommend for pt. Advised pt to ck with her ins co to see which rehab facilities are approved by her ins co. Pt scheduled 30 min appt on 01/18/15 to see Dr Glori Bickers to discuss her concerns but pt wants to know if could do call with Dr Glori Bickers rather than coming in for appt. Pt said both ankles are in splints wrapped with large amt of gauze and boots and difficult for pt to maneuver around. Pt request cb.

## 2015-01-18 ENCOUNTER — Telehealth: Payer: Self-pay | Admitting: Family Medicine

## 2015-01-18 ENCOUNTER — Ambulatory Visit: Payer: Self-pay | Admitting: Family Medicine

## 2015-01-18 ENCOUNTER — Telehealth: Payer: Self-pay | Admitting: *Deleted

## 2015-01-18 ENCOUNTER — Encounter: Payer: Self-pay | Admitting: *Deleted

## 2015-01-18 DIAGNOSIS — S82402A Unspecified fracture of shaft of left fibula, initial encounter for closed fracture: Secondary | ICD-10-CM

## 2015-01-18 NOTE — Telephone Encounter (Signed)
Fully ambulatory before fall , semi ambulatory after fall .  Morbid obesity slows her down (baseline).   Print this out and I will sign it , thanks

## 2015-01-18 NOTE — Telephone Encounter (Addendum)
-----   Message from Landis Martins, [place] sent at 01/14/2015  5:22 PM EST ----- Regarding: Home Care or Rehab Placement [facility] Ms. Mckendry fractured her left ankle and right 5th metatarsal in a trip and fall accident on 11-30. I saw her today and placed her in a walking boot on the right and in a walking posterior splint on the left with use of a rolling walker. The patient is diabetic poorly controlled, morbidly obese, and suffers with anxiety. Patient came to office assisted by mom who states that patient has a hard time caring for herself in this condition she lives alone and has a home condition that makes it difficult.  I advised patient to live with parents or get assistance from other relatives or friends during this time; patient has limited social support and was wondering if we can assist her with home care or either rehab placement? Thanks Dr. Cannon Kettle.  Left message with [facility] to call concerning pt hygiene and mobility problems.

## 2015-01-18 NOTE — Telephone Encounter (Signed)
[name] stated [facility] called and they need base line before ms Kabat feel. Ex.  Could she walk? Fully ambulatory?? Before fall and after  Mus be sign by dr Virgel Gess back to  [phone] She wanted to know if this could happen today.  [facility] has a bed ready for her As soon as they get letter she go

## 2015-01-18 NOTE — Telephone Encounter (Signed)
Letter done and Dr. Glori Bickers signed letter and I faxed it back and notified [name] letter faxed

## 2015-01-19 NOTE — Telephone Encounter (Signed)
Mackenzie Bonilla updated and advise once we know something from pt's insurance we will let her know

## 2015-01-19 NOTE — Telephone Encounter (Signed)
Please let them know I have not been able to get a hold of the doctor for the peer to peer consult - I left them my numbers and I'm also working the evening clinic tonight Will let them know when I hear something

## 2015-01-19 NOTE — Telephone Encounter (Signed)
Mackenzie Bonilla called back and [employer] denied [facility]'s request for rehab. They are requiring a Peer to Peer review from Dr. Glori Bickers. Mackenzie Bonilla requested Dr. Glori Bickers to call the [facility] dpt. ASAP and speak to their MD to see if we can get an Physician Appeal for her rehab because [facility]'s rehab is trying to hold her bed as long as possible but she doesn't know how long they can hold that bed. Mackenzie Bonilla said called [phone], opt 2, and it's ext. [name] request call back with outcome of appeal

## 2015-01-20 ENCOUNTER — Ambulatory Visit: Payer: Self-pay | Admitting: *Deleted

## 2015-01-20 NOTE — Telephone Encounter (Signed)
Mackenzie Bonilla called back and I notified her that the rehab was approved.  She was very happy!

## 2015-01-20 NOTE — Telephone Encounter (Signed)
I just got through to her insurance -they are going to approve the rehab and get things going- please let them know

## 2015-01-21 DIAGNOSIS — R262 Difficulty in walking, not elsewhere classified: Secondary | ICD-10-CM | POA: Diagnosis not present

## 2015-01-21 DIAGNOSIS — F419 Anxiety disorder, unspecified: Secondary | ICD-10-CM | POA: Diagnosis not present

## 2015-01-21 DIAGNOSIS — M84373D Stress fracture, unspecified ankle, subsequent encounter for fracture with routine healing: Secondary | ICD-10-CM | POA: Diagnosis not present

## 2015-01-21 DIAGNOSIS — S92301D Fracture of unspecified metatarsal bone(s), right foot, subsequent encounter for fracture with routine healing: Secondary | ICD-10-CM | POA: Diagnosis not present

## 2015-01-21 DIAGNOSIS — S92811A Other fracture of right foot, initial encounter for closed fracture: Secondary | ICD-10-CM | POA: Diagnosis not present

## 2015-01-21 DIAGNOSIS — M109 Gout, unspecified: Secondary | ICD-10-CM | POA: Diagnosis not present

## 2015-01-21 DIAGNOSIS — F329 Major depressive disorder, single episode, unspecified: Secondary | ICD-10-CM | POA: Diagnosis not present

## 2015-01-21 DIAGNOSIS — K219 Gastro-esophageal reflux disease without esophagitis: Secondary | ICD-10-CM | POA: Diagnosis not present

## 2015-01-21 DIAGNOSIS — G933 Postviral fatigue syndrome: Secondary | ICD-10-CM | POA: Diagnosis not present

## 2015-01-21 DIAGNOSIS — S92812A Other fracture of left foot, initial encounter for closed fracture: Secondary | ICD-10-CM | POA: Diagnosis not present

## 2015-01-21 DIAGNOSIS — R41841 Cognitive communication deficit: Secondary | ICD-10-CM | POA: Diagnosis not present

## 2015-01-21 DIAGNOSIS — M6281 Muscle weakness (generalized): Secondary | ICD-10-CM | POA: Diagnosis not present

## 2015-01-21 DIAGNOSIS — E784 Other hyperlipidemia: Secondary | ICD-10-CM | POA: Diagnosis not present

## 2015-01-21 DIAGNOSIS — S82402A Unspecified fracture of shaft of left fibula, initial encounter for closed fracture: Secondary | ICD-10-CM | POA: Diagnosis not present

## 2015-01-21 DIAGNOSIS — M25579 Pain in unspecified ankle and joints of unspecified foot: Secondary | ICD-10-CM | POA: Diagnosis not present

## 2015-01-21 DIAGNOSIS — E089 Diabetes mellitus due to underlying condition without complications: Secondary | ICD-10-CM | POA: Diagnosis not present

## 2015-01-21 DIAGNOSIS — R1312 Dysphagia, oropharyngeal phase: Secondary | ICD-10-CM | POA: Diagnosis not present

## 2015-01-21 DIAGNOSIS — Z5189 Encounter for other specified aftercare: Secondary | ICD-10-CM | POA: Diagnosis not present

## 2015-01-21 DIAGNOSIS — D509 Iron deficiency anemia, unspecified: Secondary | ICD-10-CM | POA: Diagnosis not present

## 2015-01-21 DIAGNOSIS — I10 Essential (primary) hypertension: Secondary | ICD-10-CM | POA: Diagnosis not present

## 2015-01-21 DIAGNOSIS — E119 Type 2 diabetes mellitus without complications: Secondary | ICD-10-CM | POA: Diagnosis not present

## 2015-01-21 DIAGNOSIS — E785 Hyperlipidemia, unspecified: Secondary | ICD-10-CM | POA: Diagnosis not present

## 2015-01-21 NOTE — Telephone Encounter (Addendum)
Letter stating pt is in [facility] a inpatient rehab facility.  [facility] states pt's mtr wanted them to wait to see if she could get pt in to [facility].   01/25/2015 - I tried to contact [facility], but the mailbox to [phone] was not able to accept messages, may have been a wrong number.  Picked up call from [facility] and was told they need orders for weight bearing status due to pt's weight and fracture.  I requested a copy of weight bearing criteria to be faxed to [phone], but it was not received.  [facility] called states they are still waiting orders.  Faxed [facility] note with clarification for orders on weight bearing to Dr. Cannon Kettle.  01/26/2015 - Faxed [facility] note with clarification of orders on weight bearing to [facility] - Dr. Cannon Kettle.

## 2015-01-25 DIAGNOSIS — G933 Postviral fatigue syndrome: Secondary | ICD-10-CM | POA: Diagnosis not present

## 2015-01-25 DIAGNOSIS — K219 Gastro-esophageal reflux disease without esophagitis: Secondary | ICD-10-CM | POA: Diagnosis not present

## 2015-01-25 DIAGNOSIS — F419 Anxiety disorder, unspecified: Secondary | ICD-10-CM | POA: Diagnosis not present

## 2015-01-25 DIAGNOSIS — S92811A Other fracture of right foot, initial encounter for closed fracture: Secondary | ICD-10-CM | POA: Diagnosis not present

## 2015-01-25 DIAGNOSIS — E784 Other hyperlipidemia: Secondary | ICD-10-CM | POA: Diagnosis not present

## 2015-01-25 DIAGNOSIS — S92812A Other fracture of left foot, initial encounter for closed fracture: Secondary | ICD-10-CM | POA: Diagnosis not present

## 2015-01-25 DIAGNOSIS — F329 Major depressive disorder, single episode, unspecified: Secondary | ICD-10-CM | POA: Diagnosis not present

## 2015-01-25 DIAGNOSIS — E119 Type 2 diabetes mellitus without complications: Secondary | ICD-10-CM | POA: Diagnosis not present

## 2015-01-31 ENCOUNTER — Ambulatory Visit: Payer: Medicare PPO

## 2015-01-31 ENCOUNTER — Other Ambulatory Visit: Payer: Self-pay | Admitting: Podiatry

## 2015-01-31 ENCOUNTER — Ambulatory Visit (INDEPENDENT_AMBULATORY_CARE_PROVIDER_SITE_OTHER): Payer: Medicare PPO | Admitting: Podiatry

## 2015-01-31 ENCOUNTER — Ambulatory Visit: Payer: Medicare PPO | Admitting: Podiatry

## 2015-01-31 ENCOUNTER — Ambulatory Visit (INDEPENDENT_AMBULATORY_CARE_PROVIDER_SITE_OTHER): Payer: Medicare PPO

## 2015-01-31 DIAGNOSIS — S82402A Unspecified fracture of shaft of left fibula, initial encounter for closed fracture: Secondary | ICD-10-CM | POA: Diagnosis not present

## 2015-01-31 DIAGNOSIS — S92301D Fracture of unspecified metatarsal bone(s), right foot, subsequent encounter for fracture with routine healing: Secondary | ICD-10-CM

## 2015-01-31 NOTE — Progress Notes (Signed)
She presents today with assistance from her mother and a rehabilitation employee regarding her bilateral lower extremities. She is currently in rehabilitation for fracture of the left fibula and a fracture of the fifth metatarsal base right foot. She is currently requesting discharged from that facility to home care. I expressed to her that due to her comorbidities including her severe diabetes and her obesity that home care at this point with nearly be impossible. She states that her feet are still painful and swollen.  Objective: Vital signs stable. Cam Walker right lower extremity intact left lower extremity is in a Darco shoe. Dressings removed demonstrates moderate edema with excoriations and irritation to the skin from the dressings. At this point I think she would be better off without dressings rather than this irritation resulting in skin breakdown. There is no signs of infection. She has minimal pain on palpation right foot. She has some tenderness on palpation of the left fibula. Radiographs today taken confirm minimally displaced fibula left minimally displaced with metatarsal right.  Assessment: Fractured fibula left ankle. Fracture fifth metatarsal base right foot.  Plan: We discussed etiology pathology conservative versus surgical therapies. I encouraged her to continue rehabilitation with physical therapy and occupational therapy. I placed her in bilateral Cam Walker's and remaining recommended physical therapy and occupational therapy continue all treatment plans. I will follow-up with her in 2 weeks to see if we have any healing. Also encouraged her to take aspirin daily to help prevent blood clots.

## 2015-02-04 DIAGNOSIS — E119 Type 2 diabetes mellitus without complications: Secondary | ICD-10-CM | POA: Diagnosis not present

## 2015-02-04 DIAGNOSIS — M25579 Pain in unspecified ankle and joints of unspecified foot: Secondary | ICD-10-CM | POA: Diagnosis not present

## 2015-02-04 DIAGNOSIS — F419 Anxiety disorder, unspecified: Secondary | ICD-10-CM | POA: Diagnosis not present

## 2015-02-04 DIAGNOSIS — M109 Gout, unspecified: Secondary | ICD-10-CM | POA: Diagnosis not present

## 2015-02-04 DIAGNOSIS — E784 Other hyperlipidemia: Secondary | ICD-10-CM | POA: Diagnosis not present

## 2015-02-04 DIAGNOSIS — K219 Gastro-esophageal reflux disease without esophagitis: Secondary | ICD-10-CM | POA: Diagnosis not present

## 2015-02-08 ENCOUNTER — Ambulatory Visit: Payer: Medicare PPO | Admitting: Sports Medicine

## 2015-02-15 ENCOUNTER — Encounter: Payer: Self-pay | Admitting: Podiatry

## 2015-02-15 ENCOUNTER — Ambulatory Visit (INDEPENDENT_AMBULATORY_CARE_PROVIDER_SITE_OTHER): Payer: Medicare Other | Admitting: Podiatry

## 2015-02-15 ENCOUNTER — Ambulatory Visit (INDEPENDENT_AMBULATORY_CARE_PROVIDER_SITE_OTHER): Payer: Medicare Other

## 2015-02-15 ENCOUNTER — Ambulatory Visit: Payer: Medicare PPO | Admitting: Podiatry

## 2015-02-15 VITALS — BP 126/81 | HR 81 | Resp 18

## 2015-02-15 DIAGNOSIS — L600 Ingrowing nail: Secondary | ICD-10-CM

## 2015-02-15 DIAGNOSIS — S82402D Unspecified fracture of shaft of left fibula, subsequent encounter for closed fracture with routine healing: Secondary | ICD-10-CM | POA: Diagnosis not present

## 2015-02-15 DIAGNOSIS — S92301D Fracture of unspecified metatarsal bone(s), right foot, subsequent encounter for fracture with routine healing: Secondary | ICD-10-CM | POA: Diagnosis not present

## 2015-02-15 DIAGNOSIS — R52 Pain, unspecified: Secondary | ICD-10-CM

## 2015-02-15 NOTE — Progress Notes (Signed)
Patient ID: Mackenzie Bonilla, female   DOB: [birth year], 50 y.o.   MRN: [id number]  Subjective: 50 year old female presents the office today for follow-up evaluation of left fibular fracture as well as right fifth metatarsal base fracture. She presents today to wheelchair. She still following the steps on 01/12/2015 while at home. After that she was seen at the emergency room and followed up with Dr. Milinda Pointer. She is currently in a rehabilitation facility and asking to be discharged. She states that overall the pain to her feet and ankles bilaterally is much better. She does wear the CAM boot. She does ambulate from the bed to the bathroom other than that she has tried remaining nonweightbearing as possible. No other complaints.  Objective: AAO x3, NAD DP/PT pulses palpable, CRT less than 3 seconds Protective sensation decreased with Simms Weinstein monofilament There is mild tenderness to palpation of the fifth metatarsal base and the right foot into the lateral aspect the left ankle on the distal fibula. There is no other areas of tenderness to bilateral lower extremities. There is chronic lower extremity edema per the patient and she feels that subjectively the swelling has decreased. There is no overlying erythema. No significant deformity is present. No open lesions. Bilateral hallux nails are hypertrophic, dystrophic, discolored and incurvated without any surrounding erythema or drainage. There is tenderness hallux toenails. There is no pain with calf compression, swelling, warmth, erythema bilaterally.  Assessment: 50 year old female with right fifth metatarsal base fracture and right fibular fracture; bilateral hallux onychodystrophy  Plan: -Treatment options discussed including all alternatives, risks, and complications -X-rays were obtained and reviewed with the patient. There does continue to be evidence of fracture the right fifth metatarsal base and the left fibula.  -Continue  nonweightbearing CAM boots bilaterally. -At this point I do not think it is safe for her to go home and I recommend continuing the rehabilitation facility. -Continue aspirin daily. Discussed symptoms of DVT/PE and directed to the ER immediately should any occur. -Bilateral hallux nails debrided without complication/bleeding -Follow-up in 2 weeks or sooner if any problems arise. In the meantime, encouraged to call the office with any questions, concerns, change in symptoms.   Celesta Gentile, DPM

## 2015-02-16 ENCOUNTER — Ambulatory Visit: Payer: Medicare PPO | Admitting: Podiatry

## 2015-03-02 ENCOUNTER — Ambulatory Visit (INDEPENDENT_AMBULATORY_CARE_PROVIDER_SITE_OTHER): Payer: Medicare Other

## 2015-03-02 ENCOUNTER — Ambulatory Visit: Payer: Medicare PPO | Admitting: Podiatry

## 2015-03-02 ENCOUNTER — Ambulatory Visit (INDEPENDENT_AMBULATORY_CARE_PROVIDER_SITE_OTHER): Payer: Medicare Other | Admitting: Podiatry

## 2015-03-02 ENCOUNTER — Encounter: Payer: Self-pay | Admitting: Podiatry

## 2015-03-02 VITALS — BP 118/79 | HR 74 | Resp 18

## 2015-03-02 DIAGNOSIS — R52 Pain, unspecified: Secondary | ICD-10-CM | POA: Diagnosis not present

## 2015-03-02 DIAGNOSIS — S82402A Unspecified fracture of shaft of left fibula, initial encounter for closed fracture: Secondary | ICD-10-CM

## 2015-03-02 DIAGNOSIS — Z9181 History of falling: Secondary | ICD-10-CM

## 2015-03-02 DIAGNOSIS — S92301D Fracture of unspecified metatarsal bone(s), right foot, subsequent encounter for fracture with routine healing: Secondary | ICD-10-CM | POA: Diagnosis not present

## 2015-03-02 NOTE — Progress Notes (Signed)
She presents today for follow-up of a fifth metatarsal base fracture right foot and a fractured fibula left. She presents in a wheelchair from [facility] with her mother and one assistant. She continues to wear her Cam Walker's regularly. She states the physical therapy has not tried to walk her at all. She is requesting to leave the facility to go home. Her mother states to me that she is going to modify her daughter's house to make it more conducive to a wheelchair or scooter.  Objective: She is alert and oriented 3 presents in a wheelchair. Cam Walker is intact. X-rays taken today do not demonstrate healing of the fibula or the fifth metatarsal base.  Assessment: Secondary to her obesity and other comorbidities her bones just or nonhealing. At this point utilizing the [facility] on a regular basis I feel that she could be stable enough to ambulate.  Plan: I'm requesting that physical therapy and occupational therapy evaluate her for at home therapy as well as use of a walker at home to ambulate. I will her trained and evaluated by PT for this. She will not be released until she is able to walk with a walker under own power. I put this in the order form today.

## 2015-03-09 ENCOUNTER — Telehealth: Payer: Self-pay | Admitting: *Deleted

## 2015-03-09 ENCOUNTER — Other Ambulatory Visit: Payer: Self-pay

## 2015-03-09 NOTE — Telephone Encounter (Signed)
Please have her check with Dr Dwyane Dee to see if they can refill it until she is well enough to get in for an appointment first -this should come from her endocrinologist - she can update them with what is going on

## 2015-03-09 NOTE — Telephone Encounter (Addendum)
Mackenzie Bonilla, Physical Therapist at [facility] states pt is going home from an assisted living facility and is in need of strength and gait training 2xweek for 3 weeks and to use assistance devices while ambulating through her home that has some architectural issues.  03/28/2015-[name] - [name] states he needs a verbal order stating he is to continue previous PT orders 2x week for 3 weeks.  I okayed the continuation of the orders, Dr. Milinda Pointer had ordered last week.

## 2015-03-09 NOTE — Telephone Encounter (Signed)
Keep me posted, thanks  [facility] she is having a good recovery

## 2015-03-09 NOTE — Telephone Encounter (Signed)
Pt left v/m requesting refill truclicity 1.5mg  to [facility] [address] Pt usually gets refill from Dr Dwyane Dee but pt cannot go in for visit due to fx feet. Pt request cb.

## 2015-03-10 ENCOUNTER — Telehealth: Payer: Self-pay | Admitting: Endocrinology

## 2015-03-10 ENCOUNTER — Other Ambulatory Visit: Payer: Self-pay | Admitting: *Deleted

## 2015-03-10 MED ORDER — DULAGLUTIDE 1.5 MG/0.5ML ~~LOC~~ SOAJ: SUBCUTANEOUS | Status: DC

## 2015-03-10 NOTE — Telephone Encounter (Signed)
30 day prescription, no refill, needs to schedule follow-up anyway

## 2015-03-10 NOTE — Telephone Encounter (Signed)
Patient called stating to request refill on her Rx  On 11.30.16 broke both feet when she fell down her front stairs; rehabilitation center for 40 days  Mrs. Wait can not leave her house at the moment   Rx: Trulicity 1.5   Pharmacy: [facility]   Thank you

## 2015-03-10 NOTE — Telephone Encounter (Signed)
If she tolerates it - I recommend mucinex DM for cough  Zyrtec may help dry up the drip  Both otc   F/u if no improvement

## 2015-03-10 NOTE — Telephone Encounter (Signed)
Pt will call endo and let them know her situation and see if they will fill med for her.  Pt did want me to let Dr. Glori Bickers know that she has a bad cough and congestion. Pt said the mucus has no color but it's really thick and constant and it makes her cough all the time and it also makes her really nauseous to the point she is vomiting often. Pt said she "can't keep any food down", pt has tried allergy med and OTC dramamine to try to help with the congestion and nausea but nothing is helping and wants Dr. Glori Bickers to advise her on what to do or prescribe her something, please advise

## 2015-03-10 NOTE — Telephone Encounter (Signed)
Noted, rx sent for 30 day supply with 0 refills.

## 2015-03-10 NOTE — Telephone Encounter (Signed)
Please see below, her last visit with you was 10/2014

## 2015-03-11 ENCOUNTER — Ambulatory Visit: Payer: Self-pay | Admitting: Family Medicine

## 2015-03-14 ENCOUNTER — Other Ambulatory Visit: Payer: Self-pay | Admitting: *Deleted

## 2015-03-14 MED ORDER — GLIPIZIDE ER 5 MG PO TB24: ORAL_TABLET | ORAL | Status: DC

## 2015-03-14 NOTE — Telephone Encounter (Signed)
Pt notified of Dr. Tower's comments and verbalized understanding  

## 2015-03-16 ENCOUNTER — Telehealth: Payer: Self-pay

## 2015-03-16 NOTE — Telephone Encounter (Signed)
I have to give them a reason she HAS to stay on the ventolin or a hx of adverse reaction to pro air -otherwise they will not cover it  Does she have more specific reasons?   If not - the pro air should be pretty much the same

## 2015-03-16 NOTE — Telephone Encounter (Signed)
Pt hasn't tried the pro air so she is willing to try it 1st before we try to do a PA for the ventolin inhaler, pt doesn't need a refill right now she just received the letter from her insurance company. Pt will call us back when she needs [place] to send in the pro air inhaler

## 2015-03-16 NOTE — Telephone Encounter (Signed)
See prev note, can we do PA without pt trying Proair inhaler, please advise

## 2015-03-16 NOTE — Telephone Encounter (Signed)
Pt left v/m; pt ins changed 02/13/2015 to [employer]; [facility] does not want to cover ventolin inhaler and pt has used ventolin for a long time and does well for pt; [employer] wants pt to try Proair HFA; pt request to stay on ventolin inhaler and request ins co to be contacted to allow pt to stay on ventolin inhaler; ?PA.

## 2015-03-22 ENCOUNTER — Telehealth: Payer: Self-pay

## 2015-03-22 MED ORDER — SULFAMETHOXAZOLE-TRIMETHOPRIM 800-160 MG PO TABS: 1.0000 | ORAL_TABLET | Freq: Two times a day (BID) | ORAL | Status: DC

## 2015-03-22 NOTE — Telephone Encounter (Signed)
That is very concerning - her lack of support physically concerns me - it sounds like they should not have released her so soon then.   The best I can do is send in a sulfa abx (bactrim) and hope it covers sinus and uti  Before she starts taking it- I need a family member to bring in a urine sample so be can get it off for culture (they can pick up supplies here) If symptoms worsen- the only other option is to call and ambulance to take her to the hospital - I would rather avoid that  She will have to use an otc yeast cream for the itching   I will send bactrim to the pharmacy-do not start it until they drop off the urine sample Keep me posted

## 2015-03-22 NOTE — Telephone Encounter (Signed)
Pt has been home for 2 weeks, she can't get to the office because she can only go from her room to her bathroom and that's all the weight bearing she is allowed to do right now, also pt doesn't have a wheelchair so she would have to walk to her car and she has steps from her front door and pt is afraid to go down them by herself and her mother can't help her so when she did have to go to one appt after coming home her step father had to get off of work and help her get to the car and he can't get off of work anytime soon to help her get to the car, pt is stuck in the house until her fx are healed

## 2015-03-22 NOTE — Telephone Encounter (Signed)
Pt notified of Dr. Marliss Coots comments and instructions and verbalized understanding pt will give a urine sample before starting abx so we can send it for a culture

## 2015-03-22 NOTE — Telephone Encounter (Signed)
Pt left v/m requesting abx for sinus infection, UTI or yeast infection to [facility]; pt is still non weight bearing due to fx both feet. Spoke with pt; having a lot of drainage and prod cough with thick white mucus and facial soreness. No fever and no h/a. On and off when pt urinates had burning sensation and perineal itching. No vaginal discharge or itching. Pt request cb.

## 2015-03-22 NOTE — Telephone Encounter (Signed)
I need to examine her before px abx and check a ua - is she transportable by wheelchair to come in for a visit?  Does the care facility she stays in have a physician that visits?

## 2015-03-22 NOTE — Telephone Encounter (Signed)
How long have her symptoms been going on?  Any fever ?  Is she a candidate for an e visit by any chance? Let me know-thanks

## 2015-03-22 NOTE — Telephone Encounter (Signed)
Pt doesn't have a tablet or smart phone, so she can't do the e visits Pt said her sinus sxs have been off and on for about 3 weeks and her UTI/ yeast infections for about 3 weeks too

## 2015-03-23 ENCOUNTER — Ambulatory Visit (INDEPENDENT_AMBULATORY_CARE_PROVIDER_SITE_OTHER): Payer: Medicare Other

## 2015-03-23 ENCOUNTER — Other Ambulatory Visit (INDEPENDENT_AMBULATORY_CARE_PROVIDER_SITE_OTHER): Payer: Medicare Other

## 2015-03-23 ENCOUNTER — Ambulatory Visit (INDEPENDENT_AMBULATORY_CARE_PROVIDER_SITE_OTHER): Payer: Medicare Other | Admitting: Podiatry

## 2015-03-23 DIAGNOSIS — S92301A Fracture of unspecified metatarsal bone(s), right foot, initial encounter for closed fracture: Secondary | ICD-10-CM | POA: Diagnosis not present

## 2015-03-23 DIAGNOSIS — R52 Pain, unspecified: Secondary | ICD-10-CM | POA: Diagnosis not present

## 2015-03-23 DIAGNOSIS — R300 Dysuria: Secondary | ICD-10-CM | POA: Diagnosis not present

## 2015-03-23 DIAGNOSIS — S82402D Unspecified fracture of shaft of left fibula, subsequent encounter for closed fracture with routine healing: Secondary | ICD-10-CM | POA: Diagnosis not present

## 2015-03-23 NOTE — Progress Notes (Signed)
She presents today for follow-up of fracture to the right foot fifth metatarsal base and left fibula. She continues to wear her Cam Gilford Rile is on a regular basis has home health aides attending to her to help her with showering and cooking. She also has physical therapy to help strengthen her legs and arms. She also relates to me today that when physical therapy is not bear she does not actively tried to perform the exercises demonstrated to her. She states that her feet and legs still hurt and that she is very unstable and cannot stand without a walker R boots.  Objective: Vital signs are stable she is alert and oriented 3 there is no change on physical exam. Feet are swollen there is no tenderness on palpation radiographs confirm no healing of the fractures at this point.  Assessment: Nonhealing fracture fifth metatarsal base right fibula left.  Plan: We will request that physical therapy and home health be continued and we will continue the use of the Cam Walker and walker daily and I will follow-up with her in 1 month to 6 weeks.

## 2015-03-23 NOTE — Telephone Encounter (Signed)
Pt  Called stating she will be here this afternoon today between 3:30 and 4 to leave urine sample.  She wanted you to know this so you can have everything ready for her Best number [phone]

## 2015-03-24 NOTE — Telephone Encounter (Signed)
Pt called to see if you have the results of the urine sample  rx will need to go to [facility] they will deliver to her only between certain times  Please advise pt  By 12-12:30  That will give them enough time to deliver to her

## 2015-03-24 NOTE — Telephone Encounter (Signed)
Pt notified the Rx was sent in when we 1st talked, and that we will call pt back with urine cx once it returns

## 2015-03-25 ENCOUNTER — Telehealth: Payer: Self-pay | Admitting: Endocrinology

## 2015-03-25 ENCOUNTER — Other Ambulatory Visit: Payer: Self-pay | Admitting: Endocrinology

## 2015-03-25 MED ORDER — GLIPIZIDE ER 5 MG PO TB24: ORAL_TABLET | ORAL | Status: DC

## 2015-03-25 NOTE — Telephone Encounter (Signed)
-----   Message from [place] sent at 03/23/2015  4:30 PM EST ----- Fredia Sorrow, Dr Milinda Pointer wants [facility]  and [facility].  Thanks Lattie Haw

## 2015-03-25 NOTE — Telephone Encounter (Signed)
Dr. Milinda Pointer ordered continue home health and physical therapy for 4-6 weeks, focusing on strengthing, gait and balance, safety and self care, continuing to use cam walker and walker.

## 2015-03-25 NOTE — Telephone Encounter (Signed)
Patient need refill  of Glipizide, send to  [facility], [place] [phone] (Phone) [phone] (Fax)

## 2015-03-25 NOTE — Telephone Encounter (Signed)
Patient stated that she has two broken feet and not able make it the doctor, she need he medication (Glipizide), please advise

## 2015-03-25 NOTE — Telephone Encounter (Signed)
Rx submitted

## 2015-03-27 LAB — URINE CULTURE

## 2015-04-06 ENCOUNTER — Encounter: Payer: Self-pay | Admitting: Family Medicine

## 2015-04-06 ENCOUNTER — Ambulatory Visit (INDEPENDENT_AMBULATORY_CARE_PROVIDER_SITE_OTHER): Payer: Medicare Other | Admitting: Family Medicine

## 2015-04-06 VITALS — BP 114/80 | HR 83 | Temp 97.9°F

## 2015-04-06 DIAGNOSIS — B373 Candidiasis of vulva and vagina: Secondary | ICD-10-CM

## 2015-04-06 DIAGNOSIS — K5909 Other constipation: Secondary | ICD-10-CM | POA: Insufficient documentation

## 2015-04-06 DIAGNOSIS — N8110 Cystocele, unspecified: Secondary | ICD-10-CM | POA: Diagnosis not present

## 2015-04-06 DIAGNOSIS — L304 Erythema intertrigo: Secondary | ICD-10-CM | POA: Diagnosis not present

## 2015-04-06 DIAGNOSIS — B372 Candidiasis of skin and nail: Secondary | ICD-10-CM | POA: Insufficient documentation

## 2015-04-06 DIAGNOSIS — IMO0002 Reserved for concepts with insufficient information to code with codable children: Secondary | ICD-10-CM | POA: Insufficient documentation

## 2015-04-06 DIAGNOSIS — K5900 Constipation, unspecified: Secondary | ICD-10-CM | POA: Diagnosis not present

## 2015-04-06 DIAGNOSIS — Z6841 Body Mass Index (BMI) 40.0 and over, adult: Secondary | ICD-10-CM

## 2015-04-06 DIAGNOSIS — B3731 Acute candidiasis of vulva and vagina: Secondary | ICD-10-CM

## 2015-04-06 MED ORDER — FLUCONAZOLE 150 MG PO TABS: ORAL_TABLET | ORAL | Status: DC

## 2015-04-06 MED ORDER — TERCONAZOLE 0.8 % VA CREA: TOPICAL_CREAM | VAGINAL | Status: DC

## 2015-04-06 NOTE — Progress Notes (Signed)
Pre visit review using our clinic review tool, if applicable. No additional management support is needed unless otherwise documented below in the visit note. 

## 2015-04-06 NOTE — Progress Notes (Signed)
Subjective:    Patient ID: Mackenzie Bonilla, female    DOB: Jun 15, 1965, 50 y.o.   MRN: [id number]  HPI Here for several issues /vaginal and rash and ear popping   Has continued f/u with Dr Milinda Pointer - f/u early next month for bilateral foot fractures  Can walk with a walker and use boots Healing very very slowly  PT and bath aide end on Friday  Hardest part is getting boots on and off -has to get them off in the shower  Has a shower seat   Vaginal problems   - itching won't stop - more on the outside  Tried otc powder with corn starch for wetness  (mother helps her dry that part)-now the skin is splitting a bit  Stopped sea-sorb powder  Has been wearing skirts mostly  Pants pull on the area    Feels like she has to urinate all the time - wears a pad (that makes her itch also) Her ucx showed lactobacillus  Bactrim did not help her symptoms  Also constipated at times   Ears pop-that is bothersome   Patient Active Problem List   Diagnosis Date Noted  . Intertrigo 04/06/2015  . Cystocele 04/06/2015  . Constipation 04/06/2015  . Yeast vaginitis 12/28/2014  . Urine frequency 12/28/2014  . Back pain 12/28/2014  . Hypertriglyceridemia 10/14/2014  . Breast mass in female 07/28/2014  . UTI (urinary tract infection) 03/02/2014  . Right otitis media 10/21/2013  . Ingrown toenail 08/11/2013  . Right knee pain 07/23/2013  . Fall 07/23/2013  . Insect bite 06/05/2013  . Great toe pain 05/12/2013  . Medication management 11/20/2012  . Abdominal pain, unspecified site 04/23/2012  . Uric acid kidney stone 11/20/2011  . HYPERHIDROSIS 11/07/2009  . HOT FLASHES 06/17/2009  . Type 2 diabetes, uncontrolled, with neuropathy ([place]) 08/23/2008  . VITAMIN D DEFICIENCY 05/07/2008  . VITAMIN B12 DEFICIENCY 06/04/2007  . CHRONIC FATIGUE SYNDROME 11/15/2006  . DIVERTICULITIS, HX OF 11/15/2006  . POLYCYSTIC OVARIAN DISEASE 10/16/2006  . Hyperlipidemia LDL goal <100 10/16/2006  . Morbid obesity  with body mass index of 60.0-69.9 in adult ([place]) 10/16/2006  . ANXIETY 10/16/2006  . PANIC DISORDER 10/16/2006  . BULIMIA 10/16/2006  . Depression 10/16/2006  . CARPAL TUNNEL SYNDROME 10/16/2006  . LYMPHEDEMA 10/16/2006  . ALLERGIC RHINITIS 10/16/2006  . ASTHMA, MILD, INTERMITTENT 10/16/2006  . TMJ SYNDROME 10/16/2006  . GERD 10/16/2006  . IRRITABLE BOWEL SYNDROME 10/16/2006  . OSTEOARTHRITIS 10/16/2006  . Myalgia and myositis 10/16/2006  . COUGH, CHRONIC 10/16/2006   Past Medical History  Diagnosis Date  . Allergy     allergic rhinitis  . Depression   . Hyperlipidemia   . Arthritis     osteoarthritis  . Obesity   . Lymphedema   . Tachycardia   . Neuromuscular disorder (HCC)     fibromyalgia  . Hyperhidrosis   . GERD (gastroesophageal reflux disease)     EGD negative 06/2001// EGD erythematous mucosa, polyp 08/2008  . Fatty liver 07/2008    abd. ultrasound - fatty liver ; slt dilated cbd (no stones ) 06/10// abd.  ultrasound normal on 04/2006  . Diabetes mellitus without complication ([place])   . Fibromyalgia   . Shortness of breath dyspnea   . Asthma   . Kidney stones   . UTI (lower urinary tract infection)   . High uric acid in 24 hour urine specimen   . Anemia   . TMJ (dislocation of temporomandibular joint)   .  Anxiety   . Claustrophobia     does not like oxygen mask covering face   Past Surgical History  Procedure Laterality Date  . Tonsillectomy    . Septoplasty      two times  . Foot surgery    . Uterine tumor  09/2001  . Vagus nerve stimulator insertion    . Endometrial biopsy  01/2004  . Uterine fibroid surgery  06/2005    ablation  . Colonoscopy    . Esophagogastroduodenoscopy    . Breast lumpectomy with radioactive seed localization Right 08/02/2014    Procedure: BREAST LUMPECTOMY WITH RADIOACTIVE SEED LOCALIZATION;  Surgeon: Rolm Bookbinder, MD;  Location: [facility];  Service: General;  Laterality: Right;   Social History  Substance Use Topics  .  Smoking status: Never Smoker   . Smokeless tobacco: Never Used  . Alcohol Use: No   Family History  Problem Relation Age of Onset  . Hypertension Father   . Cancer Father     pancreatic cancer  . Hypertension Sister   . Heart disease Maternal Grandmother 60    MI  . Diabetes Maternal Grandmother   . Heart disease Paternal Grandmother 73    MI  . Diabetes Paternal Grandmother    Allergies  Allergen Reactions  . Cephalexin   . Codeine     REACTION: ? reaction  . Duloxetine   . Hydrocodone     REACTION: ? reaction  . Levaquin [Levofloxacin] Nausea Only  . Metformin And Related Diarrhea    GI side effects   . Prednisone     Tolerates intraarticular depo-medrol.  . Quetiapine   . Sertraline Hcl     REACTION: vivid dreams  . Triazolam     REACTION: ? reaction  . Zaleplon   . Zocor [Simvastatin - High Dose]     achey  . Zolpidem Tartrate     hallucinations  . Norelgestromin-Eth Estradiol     Patch didn't stick to skin   Current Outpatient Prescriptions on File Prior to Visit  Medication Sig Dispense Refill  . ACCU-CHEK AVIVA PLUS test strip     . ACCU-CHEK SOFTCLIX LANCETS lancets     . acetaminophen (TYLENOL) 500 MG tablet Take 500 mg by mouth every 6 (six) hours as needed.     Marland Kitchen albuterol (PROVENTIL HFA;VENTOLIN HFA) 108 (90 BASE) MCG/ACT inhaler Inhale 2 puffs into the lungs every 4 (four) hours as needed for wheezing. 1 Inhaler 3  . allopurinol (ZYLOPRIM) 100 MG tablet Take 1 tablet (100 mg total) by mouth daily. 30 tablet 11  . ALPRAZolam (XANAX) 1 MG tablet Take 2 mg by mouth 3 (three) times daily as needed.     . benzonatate (TESSALON) 200 MG capsule TAKE ONE CAPSULE BY MOUTH THREE TIMES DAILY AS NEEDED FOR COUGH (Patient taking differently: TAKE ONE CAPSULE BY MOUTH AT BEDTIME AS NEEDED FOR COUGH) 90 capsule 3  . Blood Glucose Calibration (ACCU-CHEK AVIVA) SOLN     . buPROPion (WELLBUTRIN XL) 150 MG 24 hr tablet Take 300 mg by mouth every morning.     . calcium  carbonate (TUMS - DOSED IN MG ELEMENTAL CALCIUM) 500 MG chewable tablet Chew 1 tablet by mouth as needed for indigestion.     . cyanocobalamin (,VITAMIN B-12,) 1000 MCG/ML injection Inject 1 mL (1,000 mcg total) into the muscle every 8 (eight) weeks. 1 mL 3  . cyclobenzaprine (FLEXERIL) 5 MG tablet TAKE ONE TABLET THREE TIMES A DAY AS NEEDED FOR MUSCLE SPASM  30 tablet 2  . Dulaglutide (TRULICITY) 1.5 0000000 SOPN Inject contents of one pen once a week 4 pen 0  . fenofibrate (TRICOR) 48 MG tablet Take 1 tablet (48 mg total) by mouth daily. 30 tablet 11  . FLUoxetine (PROZAC) 40 MG capsule Take 80 mg by mouth daily.     . furosemide (LASIX) 20 MG tablet Take 1 tablet (20 mg total) by mouth daily. Prn edema 30 tablet 3  . gabapentin (NEURONTIN) 300 MG capsule Take 1 capsule (300 mg total) by mouth 2 (two) times daily. 60 capsule 5  . glipiZIDE (GLUCOTROL XL) 5 MG 24 hr tablet Take 1 tablet every day with breakfast. APPOINTMENT NEEDED FOR FURTHER REFILLS. 30 tablet 0  . HYDROmorphone (DILAUDID) 4 MG tablet Take 1 tablet (4 mg total) by mouth every 4 (four) hours as needed for moderate pain or severe pain. 30 tablet 0  . ibuprofen (ADVIL,MOTRIN) 600 MG tablet Take 1 tablet (600 mg total) by mouth every 8 (eight) hours as needed. 30 tablet 0  . norethindrone-ethinyl estradiol (JUNEL FE,GILDESS FE,LOESTRIN FE) 1-20 MG-MCG tablet Take 1 tablet by mouth daily.    Marland Kitchen omeprazole (PRILOSEC) 20 MG capsule TAKE ONE CAPSULE TWICE A DAY 60 capsule 3  . oxybutynin (DITROPAN XL) 15 MG 24 hr tablet Take 1 tablet (15 mg total) by mouth daily. 90 tablet 3  . oxyCODONE-acetaminophen (PERCOCET) 5-325 MG tablet Take 2 tablets by mouth every 6 (six) hours as needed for moderate pain or severe pain. 30 tablet 0  . spironolactone (ALDACTONE) 100 MG tablet Take 100 mg by mouth daily.     Marland Kitchen talc (ZEASORB) powder Apply topically as needed. (Patient taking differently: Apply 1 application topically daily as needed (sweat). ) 240 g  3  . traMADol (ULTRAM) 50 MG tablet TAKE ONE TABLET EVERY EIGHT HOURS AS NEEDED FOR PAIN 60 tablet 1  . traZODone (DESYREL) 150 MG tablet Take 450 mg by mouth at bedtime.      No current facility-administered medications on file prior to visit.    Review of Systems Review of Systems  Constitutional: Negative for fever, appetite change,  and unexpected weight change.  Eyes: Negative for pain and visual disturbance.  Respiratory: Negative for cough and shortness of breath.   Cardiovascular: Negative for cp or palpitations    Gastrointestinal: Negative for nausea, diarrhea and constipation.  Genitourinary: pos  for urgency and frequency. neg for dysuria or hematuria , pos for urinary incontinence  Skin: Negative for pallor or rash   Neurological: Negative for weakness, light-headedness, numbness and headaches.  Hematological: Negative for adenopathy. Does not bruise/bleed easily.  Psychiatric/Behavioral: pos for anx and depression        Objective:   Physical Exam  Constitutional: She appears well-developed and well-nourished. No distress.  Morbidly obese and fatigued appearing  Wearing 2 boots from fx feet and using a walker   HENT:  Head: Normocephalic and atraumatic.  Mouth/Throat: Oropharynx is clear and moist.  Eyes: Conjunctivae and EOM are normal. Pupils are equal, round, and reactive to light.  Neck: Normal range of motion. Neck supple. No JVD present. Carotid bruit is not present. No thyromegaly present.  Cardiovascular: Normal rate, regular rhythm, normal heart sounds and intact distal pulses.  Exam reveals no gallop.   Pulmonary/Chest: Effort normal and breath sounds normal. No respiratory distress. She has no wheezes. She has no rales.  No crackles  Abdominal: Soft. Bowel sounds are normal. She exhibits no distension, no abdominal bruit  and no mass. There is no tenderness.  Genitourinary:  Injected labia majora Mild white vaginal d/c Some intertrigo in vulvar and inner  thigh area w/o skin breakdown or excoriation   No pelvic LN or tenderness   Musculoskeletal: She exhibits tenderness. She exhibits no edema.  Mobility impaired with 2 boots and walker    Lymphadenopathy:    She has no cervical adenopathy.  Neurological: She is alert. She has normal reflexes.  Skin: Skin is warm and dry. No rash noted.  Psychiatric: Her speech is normal and behavior is normal. Thought content normal. Her mood appears anxious. She exhibits a depressed mood.          Assessment & Plan:   Problem List Items Addressed This Visit      Digestive   Constipation    Suggest inc fluids and fiber  Trial of miralax daily- titrate to effect Update if no improvement         Musculoskeletal and Integument   Intertrigo - Primary    Improved with cornstarch Disc care with her mother -who does much of her care - disc imp of drying areas very well with hairdryer set on cool  Keep try  Update if no further imp  Keep working on weight loss         Genitourinary   Cystocele    This is mild and bothersome to pt  Wt loss would help symptoms  She is already being tx for overactive bladder  ? If she would consider urol eval /gyn eval once her feet are healed       Yeast vaginitis    tx with oral diflucan times 3 (QOD) Also terazol for external itching (cream) Rev with her mother who is helping with tx   Update if not starting to improve in a week or if worsening        Relevant Medications   fluconazole (DIFLUCAN) 150 MG tablet     Other   Morbid obesity with body mass index of 60.0-69.9 in adult ([place])    Enc pt to keep loosing wt  Discussed how this problem influences overall health and the risks it imposes  Reviewed plan for weight loss with lower calorie diet (via better food choices and also portion control or program like weight watchers) and exercise building up to or more than 30 minutes 5 days per week including some aerobic activity    She is limited  with broken feet re: exercise  Enc upper body exercise as tolerated

## 2015-04-06 NOTE — Patient Instructions (Signed)
For constipation- try miralax over the counter - mix with water as directed and continue daily until you start having more regular bowel movements -then use as often as needed  For yeast- take the diflucan as directed  Also start using the terazol cream as directed  When things settle down go back to cornstarch  Try to keep affected areas dry - a hair dryer on the cool setting is excellent   Update if not starting to improve in a week or if worsening

## 2015-04-07 ENCOUNTER — Telehealth: Payer: Self-pay

## 2015-04-07 LAB — POCT WET PREP (WET MOUNT): KOH Wet Prep POC: NEGATIVE

## 2015-04-07 NOTE — Assessment & Plan Note (Signed)
tx with oral diflucan times 3 (QOD) Also terazol for external itching (cream) Rev with her mother who is helping with tx   Update if not starting to improve in a week or if worsening

## 2015-04-07 NOTE — Telephone Encounter (Signed)
Pt left v/m with question about terconazole. I called pt and since calling [facility] she had called pharmacist and gotten answer to her question; nothing further needed. Pt will cb if needed.

## 2015-04-07 NOTE — Assessment & Plan Note (Signed)
Enc pt to keep loosing wt  Discussed how this problem influences overall health and the risks it imposes  Reviewed plan for weight loss with lower calorie diet (via better food choices and also portion control or program like weight watchers) and exercise building up to or more than 30 minutes 5 days per week including some aerobic activity    She is limited with broken feet re: exercise  Enc upper body exercise as tolerated

## 2015-04-07 NOTE — Assessment & Plan Note (Signed)
Improved with cornstarch Disc care with her mother -who does much of her care - disc imp of drying areas very well with hairdryer set on cool  Keep try  Update if no further imp  Keep working on weight loss

## 2015-04-07 NOTE — Assessment & Plan Note (Signed)
This is mild and bothersome to pt  Wt loss would help symptoms  She is already being tx for overactive bladder  ? If she would consider urol eval /gyn eval once her feet are healed

## 2015-04-07 NOTE — Assessment & Plan Note (Signed)
Suggest inc fluids and fiber  Trial of miralax daily- titrate to effect Update if no improvement

## 2015-04-11 ENCOUNTER — Other Ambulatory Visit: Payer: Self-pay | Admitting: *Deleted

## 2015-04-11 ENCOUNTER — Telehealth: Payer: Self-pay | Admitting: Endocrinology

## 2015-04-11 DIAGNOSIS — E1140 Type 2 diabetes mellitus with diabetic neuropathy, unspecified: Secondary | ICD-10-CM

## 2015-04-11 DIAGNOSIS — IMO0002 Reserved for concepts with insufficient information to code with codable children: Secondary | ICD-10-CM

## 2015-04-11 DIAGNOSIS — E1165 Type 2 diabetes mellitus with hyperglycemia: Principal | ICD-10-CM

## 2015-04-11 MED ORDER — ACCU-CHEK AVIVA PLUS VI STRP: ORAL_STRIP | Status: DC

## 2015-04-11 MED ORDER — ACCU-CHEK SOFTCLIX LANCETS MISC: Status: DC

## 2015-04-11 NOTE — Telephone Encounter (Signed)
rx sent

## 2015-04-11 NOTE — Telephone Encounter (Signed)
Pt would like new test strips and lancets for the meter call [facility] is going to send [place] a rx.

## 2015-04-20 ENCOUNTER — Ambulatory Visit (INDEPENDENT_AMBULATORY_CARE_PROVIDER_SITE_OTHER): Payer: Medicare Other

## 2015-04-20 ENCOUNTER — Other Ambulatory Visit: Payer: Self-pay | Admitting: Endocrinology

## 2015-04-20 ENCOUNTER — Encounter: Payer: Self-pay | Admitting: Podiatry

## 2015-04-20 ENCOUNTER — Ambulatory Visit (INDEPENDENT_AMBULATORY_CARE_PROVIDER_SITE_OTHER): Payer: Medicare Other | Admitting: Podiatry

## 2015-04-20 DIAGNOSIS — E1142 Type 2 diabetes mellitus with diabetic polyneuropathy: Secondary | ICD-10-CM

## 2015-04-20 DIAGNOSIS — S82402D Unspecified fracture of shaft of left fibula, subsequent encounter for closed fracture with routine healing: Secondary | ICD-10-CM

## 2015-04-20 DIAGNOSIS — M79676 Pain in unspecified toe(s): Secondary | ICD-10-CM

## 2015-04-20 DIAGNOSIS — S92301D Fracture of unspecified metatarsal bone(s), right foot, subsequent encounter for fracture with routine healing: Secondary | ICD-10-CM

## 2015-04-20 DIAGNOSIS — B351 Tinea unguium: Secondary | ICD-10-CM

## 2015-04-20 NOTE — Progress Notes (Signed)
She presents today for follow-up of her fracture fifth metatarsal right foot and her fractured fibula left foot. She ambulates much better today utilizing her cam walker is bilaterally and her rolling walker.  Objective: Vital signs are stable she is alert and oriented 3. Pulses are palpable bilateral. She has no reproducible pain on palpation. This is consent that come down considerably. Radiographs taken today of the ankle 3 views left demonstrates fracture posteriorly displaced fibula which appears to be healing via bone callus. Also demonstrates distracted fifth metatarsal base fracture or Jones fracture which will more than likely fibrosis and I don't think this will ever heal. Her toenails are thick yellow dystrophic onychomycotic and painful on palpation.  Assessment fracture fifth met base right fracture fibula left slowly healing fibula left. Pain in limb secondary to onychomycosis.  Plan: I encouraged her to continue to utilize the [facility] and utilize her walker as balance control only. We debrided her toenails 1 through 5 bilateral.

## 2015-04-21 ENCOUNTER — Telehealth: Payer: Self-pay | Admitting: Endocrinology

## 2015-04-21 NOTE — Telephone Encounter (Signed)
Would like to see her in 30 days, no refills to be given

## 2015-04-21 NOTE — Telephone Encounter (Signed)
Last seen on 10/14/2014, she seems to get to her other physicians, Please see below and advise.

## 2015-04-21 NOTE — Telephone Encounter (Signed)
Pt wants to know if it is absolutely necessary for her to come in for her appointment because she has two broken feet and has trouble with transportation and she said she would still need a refill on trulicity but said she needs to know if the appointment is absolutely necessary.  She said her home care provider came and did her A1C and that it was a 5.6 she thinks and wanted to also let you know that.

## 2015-04-21 NOTE — Telephone Encounter (Signed)
Noted, she is aware and will keep her appointment on March 15th.

## 2015-04-22 ENCOUNTER — Other Ambulatory Visit: Payer: Self-pay | Admitting: Podiatry

## 2015-04-27 ENCOUNTER — Encounter: Payer: Self-pay | Admitting: Endocrinology

## 2015-04-27 ENCOUNTER — Ambulatory Visit (INDEPENDENT_AMBULATORY_CARE_PROVIDER_SITE_OTHER): Payer: Medicare Other | Admitting: Endocrinology

## 2015-04-27 ENCOUNTER — Other Ambulatory Visit: Payer: Self-pay | Admitting: *Deleted

## 2015-04-27 VITALS — BP 108/70 | HR 94 | Temp 98.3°F | Resp 16 | Ht 64.0 in | Wt 313.0 lb

## 2015-04-27 DIAGNOSIS — E1140 Type 2 diabetes mellitus with diabetic neuropathy, unspecified: Secondary | ICD-10-CM

## 2015-04-27 DIAGNOSIS — E1165 Type 2 diabetes mellitus with hyperglycemia: Secondary | ICD-10-CM | POA: Diagnosis not present

## 2015-04-27 DIAGNOSIS — IMO0002 Reserved for concepts with insufficient information to code with codable children: Secondary | ICD-10-CM

## 2015-04-27 LAB — COMPREHENSIVE METABOLIC PANEL
ALBUMIN: 3.9 g/dL (ref 3.5–5.2)
ALT: 10 U/L (ref 0–35)
AST: 12 U/L (ref 0–37)
Alkaline Phosphatase: 55 U/L (ref 39–117)
BUN: 12 mg/dL (ref 6–23)
CALCIUM: 9.2 mg/dL (ref 8.4–10.5)
CHLORIDE: 104 meq/L (ref 96–112)
CO2: 29 mEq/L (ref 19–32)
Creatinine, Ser: 0.84 mg/dL (ref 0.40–1.20)
GFR: 76.35 mL/min (ref >60.00)
Glucose, Bld: 85 mg/dL (ref 70–99)
POTASSIUM: 4.3 meq/L (ref 3.5–5.1)
Sodium: 140 mEq/L (ref 135–145)
Total Bilirubin: 0.3 mg/dL (ref 0.2–1.2)
Total Protein: 6.9 g/dL (ref 6.0–8.3)

## 2015-04-27 LAB — POCT GLUCOSE (DEVICE FOR HOME USE): Glucose Fasting, POC: 89 mg/dL (ref 70–99)

## 2015-04-27 LAB — MICROALBUMIN / CREATININE URINE RATIO
CREATININE, U: 234.5 mg/dL
MICROALB UR: 12.7 mg/dL — AB (ref 0.0–1.9)
Microalb Creat Ratio: 5.4 mg/g (ref 0.0–30.0)

## 2015-04-27 LAB — POCT GLYCOSYLATED HEMOGLOBIN (HGB A1C): Hemoglobin A1C: 5.8

## 2015-04-27 MED ORDER — GLIPIZIDE ER 2.5 MG PO TB24: 2.5000 mg | ORAL_TABLET | Freq: Every day | ORAL | Status: DC

## 2015-04-27 MED ORDER — DULAGLUTIDE 1.5 MG/0.5ML ~~LOC~~ SOAJ: SUBCUTANEOUS | Status: DC

## 2015-04-27 NOTE — Patient Instructions (Signed)
Check blood sugars on waking up 2 times a week Also check blood sugars about 2 hours after a meal and do this after different meals by rotation  Recommended blood sugar levels on waking up is 90-130 and about 2 hours after meal is 130-160  Please bring your blood sugar monitor to each visit, thank you  Glipizide 2.5mg  daily

## 2015-04-27 NOTE — Progress Notes (Signed)
Patient ID: Mackenzie Bonilla, female   DOB: [birth year], 50 y.o.   MRN: [id number]   Reason for Appointment: Type II Diabetes follow-up   History of Present Illness   Diagnosis date: 2012  Previous history:   She probably had prediabetes at the onset and was initially tried on diet changes only  Her diagnosis was likely made when her A1c was 7.4 in 2012  Tried  Metformin which caused GI side effects   Also has had Glipizide    Because of her A1c going up to 8.9% in 2/16 she was started on Trulicity 0.75 mg weekly  Recent history:        She was last seen in 9/16  Although her sugars improved with Trulicity 0.75 mg she still had some relatively high readings and A1c of 7.3 Also had difficulty losing weight Trulicity was increased to 1.5 mg in 9/16 but she has not been seen in follow-up since then She does not think she has any change in her nausea levels with this With not being able to go out much and her decreased appetite from Trulicity she has been able to watch her diet better and has stopped eating fast food and sweets    With this her blood sugars appear to be significantly improved with A1c near normal She has not been checking her blood sugars even after coming out of rehabilitation in January  Non-insulin hypoglycemic drugs:  Glipizide ER 5 mg daily; Trulicity 1.5 mg [place] weekly        Side effects from medications: metformin due to cramping, GI upset         Monitors blood glucose:  none    Glucometer:  Accu-Chek          Hypoglycemia:  none, has never had a low blood sugar.  Occasionally does feel dizzy but no other associated symptoms         Meals: 2 meals per day.  Has previously had difficulty complying with diet despite having follow-up sessions with dietitian Less sweets, some skittles       Physical activity: exercise: Minimal, recent leg fracture           Dietician visit: Most recent: 5/16   Weight control:  Wt Readings from Last 3 Encounters:    04/27/15 313 lb (141.976 kg)  01/12/15 330 lb (149.687 kg)  12/28/14 337 lb (152.862 kg)           Diabetes labs:  Lab Results  Component Value Date   HGBA1C 5.8 04/27/2015   HGBA1C 6.9* 11/05/2014   HGBA1C 7.3* 07/30/2014   Lab Results  Component Value Date   MICROALBUR 12.7* 04/27/2015   LDLCALC 44 06/17/2009   CREATININE 0.84 04/27/2015       Medication List       This list is accurate as of: 04/27/15 11:59 PM.  Always use your most recent med list.               ACCU-CHEK AVIVA PLUS test strip  Generic drug:  glucose blood  Use to check blood sugar once a day dx code E11.40     ACCU-CHEK AVIVA Soln     ACCU-CHEK SOFTCLIX LANCETS lancets  Use to check blood sugar once a day dx code E11.40     acetaminophen 500 MG tablet  Commonly known as:  TYLENOL  Take 500 mg by mouth every 6 (six) hours as needed.     albuterol 108 (90 Base)  MCG/ACT inhaler  Commonly known as:  PROVENTIL HFA;VENTOLIN HFA  Inhale 2 puffs into the lungs every 4 (four) hours as needed for wheezing.     allopurinol 100 MG tablet  Commonly known as:  ZYLOPRIM  Take 1 tablet (100 mg total) by mouth daily.     ALPRAZolam 1 MG tablet  Commonly known as:  XANAX  Take 2 mg by mouth 3 (three) times daily as needed.     aspirin 81 MG tablet  Take 81 mg by mouth daily.     benzonatate 200 MG capsule  Commonly known as:  TESSALON  TAKE ONE CAPSULE BY MOUTH THREE TIMES DAILY AS NEEDED FOR COUGH     buPROPion 300 MG 24 hr tablet  Commonly known as:  WELLBUTRIN XL     buPROPion 150 MG 24 hr tablet  Commonly known as:  WELLBUTRIN XL     calcium carbonate 500 MG chewable tablet  Commonly known as:  TUMS - dosed in mg elemental calcium  Chew 1 tablet by mouth as needed for indigestion.     cyanocobalamin 1000 MCG/ML injection  Commonly known as:  (VITAMIN B-12)  Inject 1 mL (1,000 mcg total) into the muscle every 8 (eight) weeks.     cyclobenzaprine 5 MG tablet  Commonly known as:   FLEXERIL  TAKE ONE TABLET THREE TIMES A DAY AS NEEDED FOR MUSCLE SPASM     Dulaglutide 1.5 MG/0.5ML Sopn  Commonly known as:  TRULICITY  Inject contents of one pen once a week     fenofibrate 48 MG tablet  Commonly known as:  TRICOR  Take 1 tablet (48 mg total) by mouth daily.     fluconazole 150 MG tablet  Commonly known as:  DIFLUCAN  Take one pill by mouth every other day for 3 dose     FLUoxetine 40 MG capsule  Commonly known as:  PROZAC  Take 80 mg by mouth daily.     furosemide 20 MG tablet  Commonly known as:  LASIX  Take 1 tablet (20 mg total) by mouth daily. Prn edema     gabapentin 300 MG capsule  Commonly known as:  NEURONTIN  TAKE ONE CAPSULE TWICE A DAY     glipiZIDE 2.5 MG 24 hr tablet  Commonly known as:  GLUCOTROL XL  Take 1 tablet (2.5 mg total) by mouth daily with breakfast.     HYDROmorphone 4 MG tablet  Commonly known as:  DILAUDID  Take 1 tablet (4 mg total) by mouth every 4 (four) hours as needed for moderate pain or severe pain.     ibuprofen 600 MG tablet  Commonly known as:  ADVIL,MOTRIN  Take 1 tablet (600 mg total) by mouth every 8 (eight) hours as needed.     norethindrone-ethinyl estradiol 1-20 MG-MCG tablet  Commonly known as:  JUNEL FE,GILDESS FE,LOESTRIN FE  Take 1 tablet by mouth daily.     omeprazole 20 MG capsule  Commonly known as:  PRILOSEC  TAKE ONE CAPSULE TWICE A DAY     oxybutynin 15 MG 24 hr tablet  Commonly known as:  DITROPAN XL  Take 1 tablet (15 mg total) by mouth daily.     oxyCODONE-acetaminophen 5-325 MG tablet  Commonly known as:  PERCOCET  Take 2 tablets by mouth every 6 (six) hours as needed for moderate pain or severe pain.     spironolactone 100 MG tablet  Commonly known as:  ALDACTONE  Take 100 mg by mouth daily.  talc powder  Commonly known as:  ZEASORB  Apply topically as needed.     terconazole 0.8 % vaginal cream  Commonly known as:  TERAZOL 3  Use externally to itchy areas daily as needed,  try to dry area well before applying     traMADol 50 MG tablet  Commonly known as:  ULTRAM  TAKE ONE TABLET EVERY EIGHT HOURS AS NEEDED FOR PAIN     traZODone 150 MG tablet  Commonly known as:  DESYREL  Take 450 mg by mouth at bedtime.        Allergies:  Allergies  Allergen Reactions  . Cephalexin   . Codeine     REACTION: ? reaction  . Duloxetine   . Hydrocodone     REACTION: ? reaction  . Levaquin [Levofloxacin] Nausea Only  . Metformin And Related Diarrhea    GI side effects   . Prednisone     Tolerates intraarticular depo-medrol.  . Quetiapine   . Sertraline Hcl     REACTION: vivid dreams  . Triazolam     REACTION: ? reaction  . Zaleplon   . Zocor [Simvastatin - High Dose]     achey  . Zolpidem Tartrate     hallucinations  . Norelgestromin-Eth Estradiol     Patch didn't stick to skin    Past Medical History  Diagnosis Date  . Allergy     allergic rhinitis  . Depression   . Hyperlipidemia   . Arthritis     osteoarthritis  . Obesity   . Lymphedema   . Tachycardia   . Neuromuscular disorder (HCC)     fibromyalgia  . Hyperhidrosis   . GERD (gastroesophageal reflux disease)     EGD negative 06/2001// EGD erythematous mucosa, polyp 08/2008  . Fatty liver 07/2008    abd. ultrasound - fatty liver ; slt dilated cbd (no stones ) 06/10// abd.  ultrasound normal on 04/2006  . Diabetes mellitus without complication (HCC)   . Fibromyalgia   . Shortness of breath dyspnea   . Asthma   . Kidney stones   . UTI (lower urinary tract infection)   . High uric acid in 24 hour urine specimen   . Anemia   . TMJ (dislocation of temporomandibular joint)   . Anxiety   . Claustrophobia     does not like oxygen mask covering face    Past Surgical History  Procedure Laterality Date  . Tonsillectomy    . Septoplasty      two times  . Foot surgery    . Uterine tumor  09/2001  . Vagus nerve stimulator insertion    . Endometrial biopsy  01/2004  . Uterine fibroid  surgery  06/2005    ablation  . Colonoscopy    . Esophagogastroduodenoscopy    . Breast lumpectomy with radioactive seed localization Right 08/02/2014    Procedure: BREAST LUMPECTOMY WITH RADIOACTIVE SEED LOCALIZATION;  Surgeon: Emelia Loron, MD;  Location: [facility] OR;  Service: General;  Laterality: Right;    Family History  Problem Relation Age of Onset  . Hypertension Father   . Cancer Father     pancreatic cancer  . Hypertension Sister   . Heart disease Maternal Grandmother 60    MI  . Diabetes Maternal Grandmother   . Heart disease Paternal Grandmother 53    MI  . Diabetes Paternal Grandmother     Social History:  reports that she has never smoked. She has never used smokeless tobacco. She  reports that she does not drink alcohol or use illicit drugs.  Review of Systems:  Review of Systems  Gastrointestinal: Positive for diarrhea.           Retinopathy- No, Last DEE was in 2016  Neuropathy- some numbness and tingling in her feet. known neuropathy. Sees podiatry and wears diabetic shoes  No hypothyroidism. her last TSH was as follows:  Lab Results  Component Value Date   TSH 2.19 06/29/2014      Hyperlipidemia- her last set of lipids  Showed high triglycerides Currently on fenofibrate  started  In 3/ 2016 And followed by PCP  Allergic to high dose Zocor ( muscle aches).    Lab Results  Component Value Date   CHOL 192 11/05/2014   HDL 43.30 11/05/2014   LDLCALC 44 06/17/2009   LDLDIRECT 112.0 11/05/2014   TRIG 305.0* 11/05/2014   CHOLHDL 4 11/05/2014    Asking about postprandial diarrhea   Examination:   BP 108/70 mmHg  Pulse 94  Temp(Src) 98.3 F (36.8 C)  Resp 16  Ht 5\' 4"  (1.626 m)  Wt 313 lb (141.976 kg)  BMI 53.70 kg/m2  SpO2 94%  Body mass index is 53.7 kg/(m^2).      ASSESSMENT/ PLAN:    Diabetes type 2 with obesity:  See history of present illness for detailed discussion of current diabetes management, blood sugar patterns and  problems identified  Blood glucose control is much better with increasing her Trulicity as also her being able to lose weight for various reasons However she does need to restart glucose monitoring at home and distance were prescribed. Because of her sugars being relatively lower she can reduce her glipizide ER to 2.5 mg now Continue 1.5 mg weekly of Trulicity and hopefully she can maintain her weight loss  She can restart walking or other exercise when able to  DIARRHEA: She will discuss treatment with her PCP or go to see gastroenterologist    LIPIDS: she will continue follow-up with PCP  Patient Instructions  Check blood sugars on waking up 2 times a week Also check blood sugars about 2 hours after a meal and do this after different meals by rotation  Recommended blood sugar levels on waking up is 90-130 and about 2 hours after meal is 130-160  Please bring your blood sugar monitor to each visit, thank you  Glipizide 2.5mg  daily          Sha Amer 04/28/2015, 1:04 PM

## 2015-04-28 ENCOUNTER — Telehealth: Payer: Self-pay | Admitting: Podiatry

## 2015-04-28 NOTE — Telephone Encounter (Signed)
Yes with the boots on

## 2015-04-28 NOTE — Telephone Encounter (Addendum)
Pt wants to know if she can go to a funeral.  Informed pt of Dr. Stephenie Acres orders.

## 2015-04-28 NOTE — Telephone Encounter (Signed)
PT CALLED IN WITH A QUESTION FOR DR HYATT WANTING TO KNOW IF SHE WOULD BE ABLE TO GO TO A FUNERAL SHE HAS THE CAST BOOT ON AND RIGHT FOOT IS BROKEN AND LEFT ANKLE BROKE ALSO.. JUST WANTS TO KNOW IF SHE COULD GO OR WOULD THE WALKING AFFECT HER

## 2015-05-01 ENCOUNTER — Other Ambulatory Visit: Payer: Self-pay | Admitting: Endocrinology

## 2015-05-04 ENCOUNTER — Telehealth: Payer: Self-pay | Admitting: *Deleted

## 2015-05-04 MED ORDER — GABAPENTIN 300 MG PO CAPS: 300.0000 mg | ORAL_CAPSULE | Freq: Three times a day (TID) | ORAL | Status: DC

## 2015-05-04 NOTE — Telephone Encounter (Signed)
Informed pt of the change in the Gabapentin 300mg  dosing and escribed to [facility].

## 2015-05-04 NOTE — Telephone Encounter (Signed)
Patient called-states that she is having a lot of pain, thinks it her neuropathy, especially big toe left and lower leg pain. She is taking gabapentin 300 mg BID. Wondering if she needs to increase it. Please advise.

## 2015-05-04 NOTE — Telephone Encounter (Signed)
Yes increase gabapentin 300 mg tid 90 with 4 rf

## 2015-05-10 ENCOUNTER — Telehealth: Payer: Self-pay | Admitting: Endocrinology

## 2015-05-10 NOTE — Telephone Encounter (Signed)
Pt is asking for lab results from 3/15 and asking if we can mail them to her

## 2015-05-11 NOTE — Telephone Encounter (Signed)
Labs mailed to patient.

## 2015-05-13 ENCOUNTER — Telehealth: Payer: Self-pay | Admitting: *Deleted

## 2015-05-13 NOTE — Telephone Encounter (Signed)
Patient called again asking [place] to go ahead with assistance if possible.

## 2015-05-13 NOTE — Telephone Encounter (Signed)
Patient called back.  She would like for [place] to hold off on initiating any services.  She has a follow up with her foot doctor on 4/19 and will discuss any needs at that time.

## 2015-05-13 NOTE — Telephone Encounter (Signed)
Please route again to [place] -thanks

## 2015-05-13 NOTE — Telephone Encounter (Signed)
Shelly from [facility] called in regards to concerns she has with this mutual patient.  Shelly feels the patient is in need of some home health assistance.  She is living on the second floor and broke both of her feet and ankles in November 2016.  She is still wearing boots and ambulates with a walker.  Darrick Penna states the patient sounded like she was "just having a lot of difficulty and isn't doing well".    I called Ms Bourdon to discuss this with her.  She had home health previously but this was discontinued due to cost.  She requires assistance in the home as far as cleaning and preparing meals.  She has lost a lot of weight because she isn't able to prepare her own meals.  She is becoming depressed because she isn't able to leave the home and/or do things on her own (she became tearful during our conversation).  It sounds like her family is not able to help her out.    Maegen -  Do you know of any resources for her?  Routed to Dr. Glori Bickers as Juluis Rainier.

## 2015-05-23 ENCOUNTER — Other Ambulatory Visit: Payer: Self-pay | Admitting: Family Medicine

## 2015-05-30 ENCOUNTER — Other Ambulatory Visit: Payer: Self-pay | Admitting: Endocrinology

## 2015-06-01 ENCOUNTER — Ambulatory Visit (INDEPENDENT_AMBULATORY_CARE_PROVIDER_SITE_OTHER): Payer: Medicare Other | Admitting: Podiatry

## 2015-06-01 ENCOUNTER — Ambulatory Visit (INDEPENDENT_AMBULATORY_CARE_PROVIDER_SITE_OTHER): Payer: Medicare Other

## 2015-06-01 ENCOUNTER — Encounter: Payer: Self-pay | Admitting: Podiatry

## 2015-06-01 VITALS — BP 126/72 | HR 87 | Resp 18

## 2015-06-01 DIAGNOSIS — S92301D Fracture of unspecified metatarsal bone(s), right foot, subsequent encounter for fracture with routine healing: Secondary | ICD-10-CM | POA: Diagnosis not present

## 2015-06-01 DIAGNOSIS — E1142 Type 2 diabetes mellitus with diabetic polyneuropathy: Secondary | ICD-10-CM | POA: Diagnosis not present

## 2015-06-01 DIAGNOSIS — S82402D Unspecified fracture of shaft of left fibula, subsequent encounter for closed fracture with routine healing: Secondary | ICD-10-CM

## 2015-06-02 NOTE — Progress Notes (Signed)
She presents today for follow-up of a fractured fibula left and a fifth metatarsal fracture right. She states that she is getting around better with her walker and utilizing her cam walkers. Her mother is with her today and states that she seems to be doing better.   Objective: vital signs are stable alert and oriented 3. Pulses are palpable. 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   Assessment: stable fractures bilateral foot.  Plan: I will allow her to get back to her regular shoes provided she continues to use her walker at all times. Follow-up with her in about 6 weeks

## 2015-06-10 ENCOUNTER — Telehealth: Payer: Self-pay | Admitting: *Deleted

## 2015-06-10 DIAGNOSIS — S82402D Unspecified fracture of shaft of left fibula, subsequent encounter for closed fracture with routine healing: Secondary | ICD-10-CM

## 2015-06-10 DIAGNOSIS — S92301D Fracture of unspecified metatarsal bone(s), right foot, subsequent encounter for fracture with routine healing: Secondary | ICD-10-CM

## 2015-06-10 NOTE — Telephone Encounter (Addendum)
[place] [facility] states pt says someone was to schedule home health care PT for her, since [facility] no longer goes to [place], but no one has gotten in touch with her.  I told Jonelle Sidle, I had not received orders for PT for this pt since 02/2015 and I would ask Dr. Milinda Pointer and contact pt and a [facility] facility.  Jonelle Sidle gave the [facility] [phone].  06/13/2015-Pt states [facility] does not come to her area of town anymore, would still like referral to PT to assist with transferring from cast, cast boot in to regular shoes and gait, strength and balance training. Pt referred to [place] home health [phone] for PT, orders faxed.  Left message informing pt of the referral.  Pt called asked if [facility] was a good group and if they knew PT and I told them we use them quite often for this type of situation and they were good.

## 2015-06-12 NOTE — Telephone Encounter (Signed)
Please schedule the same PT/OT that she had previously.  It was ordered by her pcp initially I think.

## 2015-06-23 ENCOUNTER — Encounter (HOSPITAL_COMMUNITY): Payer: Self-pay | Admitting: Nurse Practitioner

## 2015-06-23 ENCOUNTER — Emergency Department (HOSPITAL_COMMUNITY)
Admission: EM | Admit: 2015-06-23 | Discharge: 2015-06-23 | Disposition: A | Discharge status: Home / Self Care | Payer: Medicare Other | Attending: Emergency Medicine | Admitting: Emergency Medicine

## 2015-06-23 ENCOUNTER — Emergency Department (HOSPITAL_COMMUNITY): Payer: Medicare Other

## 2015-06-23 ENCOUNTER — Telehealth: Payer: Self-pay | Admitting: Family Medicine

## 2015-06-23 DIAGNOSIS — R339 Retention of urine, unspecified: Secondary | ICD-10-CM | POA: Diagnosis present

## 2015-06-23 DIAGNOSIS — J45909 Unspecified asthma, uncomplicated: Secondary | ICD-10-CM | POA: Diagnosis not present

## 2015-06-23 DIAGNOSIS — F329 Major depressive disorder, single episode, unspecified: Secondary | ICD-10-CM | POA: Insufficient documentation

## 2015-06-23 DIAGNOSIS — E785 Hyperlipidemia, unspecified: Secondary | ICD-10-CM | POA: Diagnosis not present

## 2015-06-23 DIAGNOSIS — M797 Fibromyalgia: Secondary | ICD-10-CM | POA: Diagnosis not present

## 2015-06-23 DIAGNOSIS — R319 Hematuria, unspecified: Secondary | ICD-10-CM

## 2015-06-23 DIAGNOSIS — Z8669 Personal history of other diseases of the nervous system and sense organs: Secondary | ICD-10-CM | POA: Diagnosis not present

## 2015-06-23 DIAGNOSIS — E119 Type 2 diabetes mellitus without complications: Secondary | ICD-10-CM | POA: Diagnosis not present

## 2015-06-23 DIAGNOSIS — Z7984 Long term (current) use of oral hypoglycemic drugs: Secondary | ICD-10-CM | POA: Diagnosis not present

## 2015-06-23 DIAGNOSIS — M1990 Unspecified osteoarthritis, unspecified site: Secondary | ICD-10-CM | POA: Insufficient documentation

## 2015-06-23 DIAGNOSIS — E669 Obesity, unspecified: Secondary | ICD-10-CM | POA: Insufficient documentation

## 2015-06-23 DIAGNOSIS — Z7982 Long term (current) use of aspirin: Secondary | ICD-10-CM | POA: Insufficient documentation

## 2015-06-23 DIAGNOSIS — R000 Tachycardia, unspecified: Secondary | ICD-10-CM | POA: Insufficient documentation

## 2015-06-23 DIAGNOSIS — N390 Urinary tract infection, site not specified: Secondary | ICD-10-CM | POA: Insufficient documentation

## 2015-06-23 DIAGNOSIS — N200 Calculus of kidney: Secondary | ICD-10-CM | POA: Diagnosis not present

## 2015-06-23 DIAGNOSIS — K219 Gastro-esophageal reflux disease without esophagitis: Secondary | ICD-10-CM | POA: Insufficient documentation

## 2015-06-23 DIAGNOSIS — Z872 Personal history of diseases of the skin and subcutaneous tissue: Secondary | ICD-10-CM | POA: Insufficient documentation

## 2015-06-23 DIAGNOSIS — Z793 Long term (current) use of hormonal contraceptives: Secondary | ICD-10-CM | POA: Diagnosis not present

## 2015-06-23 DIAGNOSIS — Z862 Personal history of diseases of the blood and blood-forming organs and certain disorders involving the immune mechanism: Secondary | ICD-10-CM | POA: Insufficient documentation

## 2015-06-23 DIAGNOSIS — Z79899 Other long term (current) drug therapy: Secondary | ICD-10-CM | POA: Insufficient documentation

## 2015-06-23 DIAGNOSIS — F419 Anxiety disorder, unspecified: Secondary | ICD-10-CM | POA: Diagnosis not present

## 2015-06-23 LAB — URINE MICROSCOPIC-ADD ON

## 2015-06-23 LAB — BASIC METABOLIC PANEL
Anion gap: 13 (ref 5–15)
BUN: 12 mg/dL (ref 6–20)
CALCIUM: 9.3 mg/dL (ref 8.9–10.3)
CHLORIDE: 104 mmol/L (ref 101–111)
CO2: 23 mmol/L (ref 22–32)
CREATININE: 1.25 mg/dL — AB (ref 0.44–1.00)
GFR calc non Af Amer: 50 mL/min — ABNORMAL LOW (ref >60)
GFR, EST AFRICAN AMERICAN: 58 mL/min — AB (ref >60)
GLUCOSE: 124 mg/dL — AB (ref 65–99)
Potassium: 4.2 mmol/L (ref 3.5–5.1)
Sodium: 140 mmol/L (ref 135–145)

## 2015-06-23 LAB — URINALYSIS, ROUTINE W REFLEX MICROSCOPIC
Glucose, UA: NEGATIVE mg/dL
Glucose, UA: NEGATIVE mg/dL
KETONES UR: NEGATIVE mg/dL
Ketones, ur: NEGATIVE mg/dL
NITRITE: NEGATIVE
Nitrite: NEGATIVE
PH: 5 (ref 5.0–8.0)
PROTEIN: NEGATIVE mg/dL
Protein, ur: NEGATIVE mg/dL
SPECIFIC GRAVITY, URINE: 1.029 (ref 1.005–1.030)
SPECIFIC GRAVITY, URINE: 1.029 (ref 1.005–1.030)
pH: 5 (ref 5.0–8.0)

## 2015-06-23 LAB — CBC
HEMATOCRIT: 41.3 % (ref 36.0–46.0)
Hemoglobin: 13.7 g/dL (ref 12.0–15.0)
MCH: 31.1 pg (ref 26.0–34.0)
MCHC: 33.2 g/dL (ref 30.0–36.0)
MCV: 93.9 fL (ref 78.0–100.0)
Platelets: 285 10*3/uL (ref 150–400)
RBC: 4.4 MIL/uL (ref 3.87–5.11)
RDW: 12.6 % (ref 11.5–15.5)
WBC: 11.7 10*3/uL — AB (ref 4.0–10.5)

## 2015-06-23 LAB — I-STAT CG4 LACTIC ACID, ED: Lactic Acid, Venous: 1.11 mmol/L (ref 0.5–2.0)

## 2015-06-23 MED ORDER — OXYCODONE-ACETAMINOPHEN 5-325 MG PO TABS: 1.0000 | ORAL_TABLET | Freq: Four times a day (QID) | ORAL | Status: DC | PRN

## 2015-06-23 MED ORDER — DEXTROSE 5 % IV SOLN
1.0000 g | Freq: Once | INTRAVENOUS | Status: AC
  Administered 2015-06-23: 1 g via INTRAVENOUS
  Filled 2015-06-23: qty 10

## 2015-06-23 MED ORDER — ONDANSETRON 4 MG PO TBDP
ORAL_TABLET | ORAL | Status: AC
  Filled 2015-06-23: qty 1

## 2015-06-23 MED ORDER — TAMSULOSIN HCL 0.4 MG PO CAPS: 0.4000 mg | ORAL_CAPSULE | Freq: Every day | ORAL | Status: DC

## 2015-06-23 MED ORDER — SODIUM CHLORIDE 0.9 % IV BOLUS (SEPSIS)
1000.0000 mL | Freq: Once | INTRAVENOUS | Status: AC
  Administered 2015-06-23: 1000 mL via INTRAVENOUS

## 2015-06-23 MED ORDER — SULFAMETHOXAZOLE-TRIMETHOPRIM 800-160 MG PO TABS: 1.0000 | ORAL_TABLET | Freq: Two times a day (BID) | ORAL | Status: DC

## 2015-06-23 MED ORDER — FENTANYL CITRATE (PF) 100 MCG/2ML IJ SOLN
50.0000 ug | INTRAMUSCULAR | Status: DC | PRN
  Administered 2015-06-23 (×2): 50 ug via INTRAVENOUS
  Filled 2015-06-23 (×2): qty 2

## 2015-06-23 MED ORDER — ONDANSETRON 4 MG PO TBDP
4.0000 mg | ORAL_TABLET | Freq: Once | ORAL | Status: AC
  Administered 2015-06-23: 4 mg via ORAL

## 2015-06-23 NOTE — ED Notes (Signed)
She c/o decreased urination, fevers, chills, flank pain, nasuea radiating into lower abd since yesterday morning. She is alert, breathing easily

## 2015-06-23 NOTE — Telephone Encounter (Signed)
I spoke with pt and she has lt flank pain and void very small amts but pt feels like she needs to urinate but cannot;  Pt has hx of kidney stones and pt said she does not want to go into a UTI. Pt is nauseated, no vomiting; pt ate small amt of rice today. Pt having chills and shivering; has not taken temp, no thermometer. No available appts at [facility] or [place] station.Pt advised to go to ED for eval. Pt said she will go to [facility] in [place].

## 2015-06-23 NOTE — ED Provider Notes (Signed)
CSN: [id number]     Arrival date & time 06/23/15  1716 History   First MD Initiated Contact with Patient 06/23/15 1959     Chief Complaint  Patient presents with  . Urinary Retention     (Consider location/radiation/quality/duration/timing/severity/associated sxs/prior Treatment) Patient is a 50 y.o. female presenting with general illness. The history is provided by the patient.  Illness Location:  Left-sided flank pain Severity:  Moderate Onset quality:  Gradual Duration:  2 days Timing:  Constant Progression:  Worsening Chronicity:  New Context:  History of kidney stones, recurrent UTIs. 2 days of difficulty with urinating, subjective fevers and chills, nausea. No vomiting. No diarrhea. Onset of left-sided flank pain as well. Says it feels like prior kidney stones. No vaginal bleeding or vaginal discharge. No other localizing infectious symptoms. Associated symptoms: abdominal pain, fatigue and nausea   Associated symptoms: no chest pain, no cough, no diarrhea, no headaches, no shortness of breath and no vomiting     Past Medical History  Diagnosis Date  . Allergy     allergic rhinitis  . Depression   . Hyperlipidemia   . Arthritis     osteoarthritis  . Obesity   . Lymphedema   . Tachycardia   . Neuromuscular disorder (HCC)     fibromyalgia  . Hyperhidrosis   . GERD (gastroesophageal reflux disease)     EGD negative 06/2001// EGD erythematous mucosa, polyp 08/2008  . Fatty liver 07/2008    abd. ultrasound - fatty liver ; slt dilated cbd (no stones ) 06/10// abd.  ultrasound normal on 04/2006  . Diabetes mellitus without complication ([place])   . Fibromyalgia   . Shortness of breath dyspnea   . Asthma   . Kidney stones   . UTI (lower urinary tract infection)   . High uric acid in 24 hour urine specimen   . Anemia   . TMJ (dislocation of temporomandibular joint)   . Anxiety   . Claustrophobia     does not like oxygen mask covering face   Past Surgical History   Procedure Laterality Date  . Tonsillectomy    . Septoplasty      two times  . Foot surgery    . Uterine tumor  09/2001  . Vagus nerve stimulator insertion    . Endometrial biopsy  01/2004  . Uterine fibroid surgery  06/2005    ablation  . Colonoscopy    . Esophagogastroduodenoscopy    . Breast lumpectomy with radioactive seed localization Right 08/02/2014    Procedure: BREAST LUMPECTOMY WITH RADIOACTIVE SEED LOCALIZATION;  Surgeon: Rolm Bookbinder, MD;  Location: [facility];  Service: General;  Laterality: Right;   Family History  Problem Relation Age of Onset  . Hypertension Father   . Cancer Father     pancreatic cancer  . Hypertension Sister   . Heart disease Maternal Grandmother 60    MI  . Diabetes Maternal Grandmother   . Heart disease Paternal Grandmother 69    MI  . Diabetes Paternal Grandmother    Social History  Substance Use Topics  . Smoking status: Never Smoker   . Smokeless tobacco: Never Used  . Alcohol Use: No   OB History    No data available     Review of Systems  Constitutional: Positive for chills and fatigue.  Respiratory: Negative for cough and shortness of breath.   Cardiovascular: Negative for chest pain.  Gastrointestinal: Positive for nausea and abdominal pain. Negative for vomiting and diarrhea.  Genitourinary:  Positive for urgency, frequency and flank pain. Negative for dysuria, vaginal bleeding and vaginal discharge.  Musculoskeletal: Negative for back pain.  Neurological: Negative for light-headedness and headaches.  All other systems reviewed and are negative.     Allergies  Cephalexin; Codeine; Duloxetine; Hydrocodone; Levaquin; Metformin and related; Prednisone; Quetiapine; Sertraline hcl; Triazolam; Zaleplon; Zocor; Zolpidem tartrate; and Norelgestromin-eth estradiol  Home Medications   Prior to Admission medications   Medication Sig Start Date End Date Taking? Authorizing Provider  ACCU-CHEK AVIVA PLUS test strip Use to check  blood sugar  once a day 05/30/15   Elayne Snare, MD  ACCU-CHEK SOFTCLIX LANCETS lancets Use to check blood sugar  once a day 05/30/15   Elayne Snare, MD  acetaminophen (TYLENOL) 500 MG tablet Take 500 mg by mouth every 6 (six) hours as needed.     Historical Provider, MD  albuterol (PROVENTIL HFA;VENTOLIN HFA) 108 (90 BASE) MCG/ACT inhaler Inhale 2 puffs into the lungs every 4 (four) hours as needed for wheezing. 09/29/14   Abner Greenspan, MD  allopurinol (ZYLOPRIM) 100 MG tablet Take 1 tablet (100 mg total) by mouth daily. 11/26/14   Abner Greenspan, MD  ALPRAZolam Duanne Moron) 1 MG tablet Take 2 mg by mouth 3 (three) times daily as needed.     Historical Provider, MD  aspirin 81 MG tablet Take 81 mg by mouth daily.    Historical Provider, MD  benzonatate (TESSALON) 200 MG capsule TAKE ONE CAPSULE BY MOUTH THREE TIMES DAILY AS NEEDED FOR COUGH Patient taking differently: TAKE ONE CAPSULE BY MOUTH AT BEDTIME AS NEEDED FOR COUGH 06/07/14   Abner Greenspan, MD  Blood Glucose Calibration (ACCU-CHEK AVIVA) SOLN  10/01/14   Historical Provider, MD  buPROPion (WELLBUTRIN XL) 150 MG 24 hr tablet  03/31/15   Historical Provider, MD  buPROPion (WELLBUTRIN XL) 300 MG 24 hr tablet  03/04/15   Historical Provider, MD  calcium carbonate (TUMS - DOSED IN MG ELEMENTAL CALCIUM) 500 MG chewable tablet Chew 1 tablet by mouth as needed for indigestion.     Historical Provider, MD  cyanocobalamin (,VITAMIN B-12,) 1000 MCG/ML injection Inject 1 mL (1,000 mcg total) into the muscle every 8 (eight) weeks. 12/24/14   Abner Greenspan, MD  cyclobenzaprine (FLEXERIL) 5 MG tablet TAKE ONE TABLET THREE TIMES A DAY AS NEEDED FOR MUSCLE SPASM 08/03/14   Abner Greenspan, MD  Dulaglutide (TRULICITY) 1.5 0000000 SOPN Inject contents of one pen once a week 04/27/15   Elayne Snare, MD  fenofibrate (TRICOR) 48 MG tablet Take 1 tablet (48 mg total) by mouth daily. 04/13/14   Abner Greenspan, MD  fluconazole (DIFLUCAN) 150 MG tablet Take one pill by mouth every other  day for 3 dose 04/06/15   Abner Greenspan, MD  FLUoxetine (PROZAC) 40 MG capsule Take 80 mg by mouth daily.     Historical Provider, MD  furosemide (LASIX) 20 MG tablet Take 1 tablet (20 mg total) by mouth daily. Prn edema 10/14/14   Owens Loffler, MD  gabapentin (NEURONTIN) 300 MG capsule TAKE ONE CAPSULE TWICE A DAY 04/22/15   Trula Slade, DPM  gabapentin (NEURONTIN) 300 MG capsule Take 1 capsule (300 mg total) by mouth 3 (three) times daily. 05/04/15   Max T Hyatt, DPM  glipiZIDE (GLUCOTROL XL) 2.5 MG 24 hr tablet Take 1 tablet (2.5 mg total) by mouth daily with breakfast. 04/27/15   Elayne Snare, MD  HYDROmorphone (DILAUDID) 4 MG tablet Take 1 tablet (4 mg total)  by mouth every 4 (four) hours as needed for moderate pain or severe pain. Patient not taking: Reported on 04/27/2015 01/14/15   Landis Martins, DPM  ibuprofen (ADVIL,MOTRIN) 600 MG tablet Take 1 tablet (600 mg total) by mouth every 8 (eight) hours as needed. 01/14/15   Landis Martins, DPM  norethindrone-ethinyl estradiol (JUNEL FE,GILDESS FE,LOESTRIN FE) 1-20 MG-MCG tablet Take 1 tablet by mouth daily.    Historical Provider, MD  omeprazole (PRILOSEC) 20 MG capsule TAKE ONE CAPSULE TWICE A DAY 05/23/15   Abner Greenspan, MD  oxybutynin (DITROPAN XL) 15 MG 24 hr tablet TAKE 1 TABLET EVERY DAY 05/23/15   Abner Greenspan, MD  oxyCODONE-acetaminophen (PERCOCET) 5-325 MG tablet Take 2 tablets by mouth every 6 (six) hours as needed for moderate pain or severe pain. Patient not taking: Reported on 04/27/2015 01/12/15   Earleen Newport, MD  spironolactone (ALDACTONE) 100 MG tablet Take 100 mg by mouth daily.  06/01/14   Historical Provider, MD  talc (ZEASORB) powder Apply topically as needed. Patient taking differently: Apply 1 application topically daily as needed (sweat).  09/08/13   Abner Greenspan, MD  terconazole (TERAZOL 3) 0.8 % vaginal cream Use externally to itchy areas daily as needed, try to dry area well before applying 04/06/15   Abner Greenspan,  MD  traMADol (ULTRAM) 50 MG tablet TAKE ONE TABLET EVERY EIGHT HOURS AS NEEDED FOR PAIN 05/19/14   Abner Greenspan, MD  traZODone (DESYREL) 150 MG tablet Take 450 mg by mouth at bedtime.  05/20/14   Historical Provider, MD  TRULICITY 1.5 0000000 SOPN INJECT CONTENTS OF 1 PEN ONCE A WEEK 05/02/15   Elayne Snare, MD   BP 130/97 mmHg  Pulse 116  Temp(Src) 99.3 F (37.4 C) (Oral)  Resp 18  SpO2 96% Physical Exam  Constitutional: She is oriented to person, place, and time. She appears well-developed and well-nourished. No distress.  HENT:  Head: Normocephalic and atraumatic.  Eyes: EOM are normal. Pupils are equal, round, and reactive to light.  Neck: Normal range of motion.  Cardiovascular: Regular rhythm.  Tachycardia present.   Pulmonary/Chest: No tachypnea. No respiratory distress.  Abdominal: Soft. Normal appearance. There is generalized tenderness. There is CVA tenderness (left). There is no rebound and no guarding.  Morbidly obese  Neurological: She is alert and oriented to person, place, and time. GCS eye subscore is 4. GCS verbal subscore is 5. GCS motor subscore is 6.  Skin: Skin is warm and dry.    ED Course  Procedures (including critical care time) Labs Review Labs Reviewed  URINALYSIS, ROUTINE W REFLEX MICROSCOPIC (NOT AT [facility]) - Abnormal; Notable for the following:    APPearance CLOUDY (*)    Hgb urine dipstick LARGE (*)    Bilirubin Urine SMALL (*)    Leukocytes, UA LARGE (*)    All other components within normal limits  BASIC METABOLIC PANEL - Abnormal; Notable for the following:    Glucose, Bld 124 (*)    Creatinine, Ser 1.25 (*)    GFR calc non Af Amer 50 (*)    GFR calc Af Amer 58 (*)    All other components within normal limits  CBC - Abnormal; Notable for the following:    WBC 11.7 (*)    All other components within normal limits  URINE MICROSCOPIC-ADD ON - Abnormal; Notable for the following:    Squamous Epithelial / LPF 6-30 (*)    Bacteria, UA FEW (*)    All  other components within normal limits  URINALYSIS, ROUTINE W REFLEX MICROSCOPIC (NOT AT [facility]) - Abnormal; Notable for the following:    APPearance CLOUDY (*)    Hgb urine dipstick LARGE (*)    Bilirubin Urine SMALL (*)    Leukocytes, UA LARGE (*)    All other components within normal limits  URINE MICROSCOPIC-ADD ON - Abnormal; Notable for the following:    Squamous Epithelial / LPF 0-5 (*)    Bacteria, UA FEW (*)    All other components within normal limits  URINE CULTURE  CULTURE, BLOOD (ROUTINE X 2)  CULTURE, BLOOD (ROUTINE X 2)  I-STAT CG4 LACTIC ACID, ED    Imaging Review Ct Renal Stone Study  06/23/2015  CLINICAL DATA:  Left flank pain and nausea for 2 days. Nephrolithiasis. EXAM: CT ABDOMEN AND PELVIS WITHOUT CONTRAST TECHNIQUE: Multidetector CT imaging of the abdomen and pelvis was performed following the standard protocol without IV contrast. COMPARISON:  06/06/2011 FINDINGS: Lower chest:  No acute findings. Hepatobiliary: Relative enlargement of left and caudate lobes of liver is suspicious for hepatic cirrhosis. No liver masses visualized on this unenhanced exam. Gallbladder is unremarkable. Pancreas: No mass or inflammatory process identified on this un-enhanced exam. Spleen: Within normal limits in size. Adrenals/Urinary Tract: Normal adrenal glands. No evidence of right renal calculi or hydronephrosis. Several calculi are seen within the left renal collecting system, largest measuring 2.1 cm. Mild left hydronephrosis is seen with 2 mm calculus in the proximal left ureter and a 2 mm calculus in the distal left ureter at the ureterovesical junction. No bladder calculi identified. Stomach/Bowel: No evidence of obstruction, inflammatory process, or abnormal fluid collections. Normal appendix visualized. Vascular/Lymphatic: No pathologically enlarged lymph nodes. No evidence of abdominal aortic aneurysm. Reproductive: No mass or other significant abnormality. Other: None. Musculoskeletal:   No suspicious bone lesions identified. IMPRESSION: Mild left hydroureteronephrosis, with 2 mm calculi in both the proximal and distal left ureter. Multiple calculi within left renal collecting system, largest measuring 21 mm. Probable hepatic cirrhosis. Electronically Signed   By: Earle Gell M.D.   On: 06/23/2015 22:35   I have personally reviewed and evaluated these images and lab results as part of my medical decision-making.   EKG Interpretation None      MDM   Final diagnoses:  Nephrolithiasis  Urinary tract infection with hematuria, site unspecified    50 year old female with past medical history of recurrent nephrolithiasis, recurrent UTIs, presenting with UTI in the setting of kidney stone. Symptoms are last 2 days, getting progressive worse. Endorses left-sided flank pain. Feels like prior kidney stone. Denies fevers and chills. On arrival patient well-appearing in no acute distress. Afebrile. Slightly tachycardic initially. No tachypnea. No hypoxia. Normotensive.  No tenderness to palpation of her abdomen. No perineal signs. Doubt acute abdomen. She does have some left-sided CVA tenderness. Moist mucous members. Cardiopulmonary exam normal. No lower extremity swelling.  Concern for UTI versus kidney stone. Getting a CT stone study. Urinalysis was obtained already; appears contaminated. Sending off and out catheter urinalysis. Patient has a mild AKI. Gave the patient IV fluids here. Tolerating by mouth. Electrolytes otherwise within normal limits. Mild leukocytosis. No anemia.  10:35 PM Repeat in and out catheter urinalysis showed evidence of UTI. EMR mentions a history of gait Keflex. Interviewed the patient. She reports that made her feel nauseated before. No anaphylactic symptoms. Giving Rocephin and sending off blood cultures and lactate.  CT stones study showed evidence of mild left hydroureteronephrosis with 2 mm  oculi in both the proximal and distal left ureter.  Spoke  with urology, Dr. Alyson Ingles. Since the patient is well-appearing and the stones are less than 5 mm, feel appropriate to discharge the patient home. We'll have her follow-up with her primary care doctor as soon as possible, and have her establish an appointment with urology on an outpatient basis. Patient agreeable to this plan. We'll give her a prescription for antibiotics, pain medications, Flomax. Interactions and side effects of the medications provided.  Strict return percussion provided. Patient discharged in stable condition.  Maryan Puls, MD 06/23/15 [place], MD 06/24/15 [phone]

## 2015-06-23 NOTE — Telephone Encounter (Signed)
Patient Name: Mackenzie Bonilla  DOB: [birth year]    Initial Comment caller states she has left flank pain and unable to urinate   Nurse Assessment  Nurse: Julien Girt, RN, Almyra Free Date/Time Eilene Ghazi Time): 06/23/2015 11:02:45 AM  Confirm and document reason for call. If symptomatic, describe symptoms. You must click the next button to save text entered. ---Caller states she has left flank pain and is able to urinate in very small amts. She has a prolapsed Bladder and her sx are worse today.  Has the patient traveled out of the country within the last 30 days? ---Not Applicable  Does the patient have any new or worsening symptoms? ---Yes  Will a triage be completed? ---Yes  Related visit to physician within the last 2 weeks? ---No  Does the PT have any chronic conditions? (i.e. diabetes, asthma, etc.) ---Yes  List chronic conditions. ---Diabetes, Fibromylagia, Fx left ankle /right foot, Bladder prolapsed/Overactive bladder  Is the patient pregnant or possibly pregnant? (Ask all females between the ages of 59-55) ---No  Is this a behavioral health or substance abuse call? ---No     Guidelines    Guideline Title Affirmed Question Affirmed Notes  Flank Pain [1] Unable to urinate (or only a few drops) > 4 hours AND [2] bladder feels very full (e.g., palpable bladder or strong urge to urinate)    Final Disposition User   Go to ED Now Julien Girt, RN, Almyra Free    Comments  Caller refused ED outcome, states she can not afford and would prefer to see Dr. Glori Bickers. Advised I would notify the nurse and she can anticipate a cb. Verbalized understanding of all instructions and information.   Referrals  GO TO FACILITY REFUSED   Disagree/Comply: Disagree  Disagree/Comply Reason: Disagree with instructions

## 2015-06-23 NOTE — Telephone Encounter (Signed)
Aware, thanks!

## 2015-06-24 NOTE — ED Notes (Signed)
Fentanyl 20mcg wasted with Threasa Beards RN in sharps container.

## 2015-06-24 NOTE — ED Provider Notes (Signed)
Complains of left-sided flank pain and left lower back pain onset 2 days ago. Pain is moderate and not as severe as kidney stone she's had in the past Symptoms, he by nausea. Patient is alert nontoxic abdomen morbidly obese, normal active bowel sounds tender at left mid abdomen.. No flank tenderness  Orlie Dakin, MD 06/24/15 [phone]

## 2015-06-25 LAB — URINE CULTURE

## 2015-06-28 LAB — CULTURE, BLOOD (ROUTINE X 2)
CULTURE: NO GROWTH
Culture: NO GROWTH

## 2015-07-01 ENCOUNTER — Other Ambulatory Visit: Payer: Self-pay | Admitting: Endocrinology

## 2015-07-08 ENCOUNTER — Other Ambulatory Visit: Payer: Self-pay | Admitting: Family Medicine

## 2015-07-08 NOTE — Telephone Encounter (Signed)
Rx called in as prescribed 

## 2015-07-08 NOTE — Telephone Encounter (Signed)
Last OV was 04/06/15, last filled on 05/19/14 #60 with 1 additional refill, please advise

## 2015-07-08 NOTE — Telephone Encounter (Signed)
Px written for call in   

## 2015-07-18 ENCOUNTER — Ambulatory Visit (INDEPENDENT_AMBULATORY_CARE_PROVIDER_SITE_OTHER): Payer: Medicare Other | Admitting: Podiatry

## 2015-07-18 ENCOUNTER — Encounter: Payer: Self-pay | Admitting: Podiatry

## 2015-07-18 VITALS — BP 127/77 | HR 89 | Resp 12

## 2015-07-18 DIAGNOSIS — M79676 Pain in unspecified toe(s): Secondary | ICD-10-CM | POA: Diagnosis not present

## 2015-07-18 DIAGNOSIS — B351 Tinea unguium: Secondary | ICD-10-CM | POA: Diagnosis not present

## 2015-07-18 MED ORDER — GABAPENTIN 300 MG PO CAPS: 300.0000 mg | ORAL_CAPSULE | Freq: Two times a day (BID) | ORAL | Status: DC

## 2015-07-19 NOTE — Progress Notes (Signed)
She presents today states that she is doing about 90% better that she refers to her ability to walk and maneuver after fractures of her right foot her left ankle. She's also complaining of painful elongated toenails. Physical therapy continues to work with her on a regular basis to strengthen her and encourage balance and activity.  Objective: Vital signs are stable she is alert and oriented 3 she has good range of motion and strength to bilateral lower extremities. She has no reproducible pain bilaterally. Her toenails are thick yellow dystrophic onychomycotic and painful palpation.  Assessment: 1 fractures bilateral foot with increase in activity and ability to walk more normally. He lives secondary to onychomycosis.  Plan: I encouraged physical therapy to continue current treatment plan. We also provided her with a 3 month handicap sticker. I debrided toenails 1 through 5 bilateral. I will follow-up with her in 3 months.

## 2015-07-27 ENCOUNTER — Telehealth: Payer: Self-pay | Admitting: *Deleted

## 2015-07-27 NOTE — Telephone Encounter (Addendum)
[facility] request 2 additional weeks of in home rehab for pt, to continue negotiating stairs.  Dr. Marta Antu continued PT. 09/20/2015-Pt states her left ankle is swelling has been moving. I told pt she could continue elevation and icing, but since she previously had a fracture she should come in to be evaluated by Dr. Milinda Pointer. I told pt I would have a scheduler call her tomorrow to schedule.

## 2015-08-04 ENCOUNTER — Other Ambulatory Visit: Payer: Self-pay | Admitting: Family Medicine

## 2015-08-04 NOTE — Telephone Encounter (Signed)
Please refill times 5  Thanks

## 2015-08-04 NOTE — Telephone Encounter (Signed)
Last filled on 06/07/14 #90 with 3 additional refills, please advise

## 2015-08-04 NOTE — Telephone Encounter (Signed)
done

## 2015-08-23 ENCOUNTER — Telehealth: Payer: Self-pay | Admitting: Endocrinology

## 2015-08-23 DIAGNOSIS — E1140 Type 2 diabetes mellitus with diabetic neuropathy, unspecified: Secondary | ICD-10-CM

## 2015-08-23 DIAGNOSIS — E1165 Type 2 diabetes mellitus with hyperglycemia: Principal | ICD-10-CM

## 2015-08-23 DIAGNOSIS — IMO0002 Reserved for concepts with insufficient information to code with codable children: Secondary | ICD-10-CM

## 2015-08-23 DIAGNOSIS — E282 Polycystic ovarian syndrome: Secondary | ICD-10-CM

## 2015-08-23 NOTE — Telephone Encounter (Signed)
Pt called in - she does not want to see dr Dwyane Dee any more because she can not drive to [place] due to her broken foot.  She would like to see dr Lucilla Lame at [facility]. Also, Rosalyn Charters deals with more issues that the pt has  cb number is [phone] for pt  Fax number to the new doc is [phone] attention to felicia

## 2015-08-23 NOTE — Telephone Encounter (Signed)
I will ref to Dr Gabriel Carina and cc to [facility]

## 2015-08-24 NOTE — Telephone Encounter (Signed)
Pt called and was told that Dr Gabriel Carina at [facility] needs a diagnosis code. They got the referral, but need a diagnosis code.

## 2015-08-31 ENCOUNTER — Ambulatory Visit: Payer: Self-pay | Admitting: Endocrinology

## 2015-09-21 ENCOUNTER — Encounter: Payer: Self-pay | Admitting: Podiatry

## 2015-09-21 ENCOUNTER — Ambulatory Visit (INDEPENDENT_AMBULATORY_CARE_PROVIDER_SITE_OTHER): Payer: Medicare Other | Admitting: Podiatry

## 2015-09-21 DIAGNOSIS — M79676 Pain in unspecified toe(s): Secondary | ICD-10-CM

## 2015-09-21 DIAGNOSIS — B351 Tinea unguium: Secondary | ICD-10-CM | POA: Diagnosis not present

## 2015-09-21 NOTE — Progress Notes (Signed)
She presents today with a chief complaint of painful swelling of her legs and ankles bilaterally left greater than right. She relates radiating pain from her feet along the dorsum plantar medial and lateral aspects proximally to the level of her knee. She denies any calf pain she denies fever chills nausea and vomiting. She denies any trauma to the foot. She would also like to have her nails cut.  Objective: Vital signs are stable she is alert and oriented 3. Pulses are palpable. Moderate to severe pitting edema to the bilateral lower extremity pain on palpation. Toenails are elongated and incurvated.  Assessment: Edema bilateral not of podiatric source. Pain in limb secondary to nail dystrophy.  Plan: Debridement of nails bilaterally. I suggested that she follow-up with Dr. Glori Bickers for cardiac evaluation and consider a diuretic.  Roselind Messier DPM

## 2015-09-22 ENCOUNTER — Ambulatory Visit: Payer: Self-pay | Admitting: Family Medicine

## 2015-09-27 ENCOUNTER — Telehealth: Payer: Self-pay | Admitting: Family Medicine

## 2015-09-27 NOTE — Telephone Encounter (Signed)
Pt has appt 09/28/15 at 3:45 with Dr Lorelei Pont.

## 2015-09-27 NOTE — Telephone Encounter (Signed)
Patient Name: Mackenzie Bonilla  DOB: Dec 01, 1965    Initial Comment Caller is having pain in right knee--    Nurse Assessment  Nurse: Mallie Mussel, RN, Alveta Heimlich Date/Time (Eastern Time): 09/27/2015 11:42:36 AM  Confirm and document reason for call. If symptomatic, describe symptoms. You must click the next button to save text entered. ---Caller states that she has chronic right knee pain. She has arthritis and the cartilage is almost gone. She has moved and now has stairs to climb. She is asking about getting injections again. She rates her pain as 9-10 on 0-10 scale while climbing stairs. Denies recent injury. She is having to climb stairs a lot due to packing to move.  Has the patient traveled out of the country within the last 30 days? ---No  Does the patient have any new or worsening symptoms? ---Yes  Will a triage be completed? ---Yes  Related visit to physician within the last 2 weeks? ---No  Does the PT have any chronic conditions? (i.e. diabetes, asthma, etc.) ---Yes  List chronic conditions. ---Arthritis, Diabetes, Hypercholesterolemia, Fibromyalgia, Lymphedema in right leg  Is the patient pregnant or possibly pregnant? (Ask all females between the ages of 82-55) ---No  Is this a behavioral health or substance abuse call? ---No     Guidelines    Guideline Title Affirmed Question Affirmed Notes  Knee Pain [1] MODERATE pain (e.g., interferes with normal activities, limping) AND [2] present > 3 days    Final Disposition User   See PCP When Office is Open (within 3 days) Mallie Mussel, RN, Alveta Heimlich    Comments  I called the backline and verified with Shirlean Mylar this patient has to have a 30 minute appointment instead of a 15.  Dr. Lorelei Pont has a 30 minute time frame tomorrow at 3:45pm. I tried twice to schedule the time, but it would not allow me. I called the backline and was on hold for quite a bit. I advised the caller I would call her back once I have this scheduled for her so she doesn't have to hold for a long  time. She asked if I would just leave her a message, she has to leave.  I have called the backline 3 times with extended hold time each time. I was connected to [place]. She was able to make the appointment for me.  1223- I called the caller back and left a message as requested that the appointment has been scheduled for her.   Referrals  REFERRED TO PCP OFFICE   Disagree/Comply: Comply

## 2015-09-28 ENCOUNTER — Encounter: Payer: Self-pay | Admitting: Family Medicine

## 2015-09-28 ENCOUNTER — Ambulatory Visit (INDEPENDENT_AMBULATORY_CARE_PROVIDER_SITE_OTHER): Payer: Medicare Other | Admitting: Family Medicine

## 2015-09-28 DIAGNOSIS — M1711 Unilateral primary osteoarthritis, right knee: Secondary | ICD-10-CM | POA: Diagnosis not present

## 2015-09-28 NOTE — Progress Notes (Signed)
Pre visit review using our clinic review tool, if applicable. No additional management support is needed unless otherwise documented below in the visit note. 

## 2015-09-28 NOTE — Progress Notes (Signed)
Dr. Karleen Hampshire T. Laraya Pestka, MD, CAQ Sports [facility] [address] [place], [address] Phone: [phone] Fax: [phone]  09/28/2015  Patient: Mackenzie Bonilla, MRN: [id number], DOB: [birth year], 50 y.o.  Primary Physician:  Reather Littler, MD  Chief Complaint: Knee Pain (right started about 3 weeks, ankles swell)  Subjective:   Mackenzie Bonilla is a 50 y.o. very pleasant female patient who presents with the following:  Broke L ankle and R foot.  Weent into [facility] for 40 days.   Was NWB for that time. PT to the house  Now is selling condo and does not like the steps.   9/19 Moving again. To place with no steps.  Knee has flared up also in the last few months.   09/27/2015 Last OV with Hannah Beat, MD  See dictation below.  Pins and needles started in her R knee, muscles are jumping in her arms and neck.  Having a lot of pain, multiple places.   Lymphedema: B Trial of   09/30/2014 Last OV with Hannah Beat, MD  Joined a gym.  R lateral numbness has started to gone away.   Leg curls -  Could only do 6 yesterday.  Working out at [employer].  Showed a few arm workouts.  Neck pain down the her neck and had to take a tylenol yesterday.   Leg extensions.   Past Medical History, Surgical History, Social History, Family History, Problem List, Medications, and Allergies have been reviewed and updated if relevant.  Patient Active Problem List   Diagnosis Date Noted  . Intertrigo 04/06/2015  . Cystocele 04/06/2015  . Constipation 04/06/2015  . Yeast vaginitis 12/28/2014  . Urine frequency 12/28/2014  . Back pain 12/28/2014  . Hypertriglyceridemia 10/14/2014  . Breast mass in female 07/28/2014  . UTI (urinary tract infection) 03/02/2014  . Right otitis media 10/21/2013  . Ingrown toenail 08/11/2013  . Right knee pain 07/23/2013  . Fall 07/23/2013  . Insect bite 06/05/2013  . Great toe pain 05/12/2013  . Medication management  11/20/2012  . Abdominal pain, unspecified site 04/23/2012  . Uric acid kidney stone 11/20/2011  . HYPERHIDROSIS 11/07/2009  . HOT FLASHES 06/17/2009  . Type 2 diabetes, uncontrolled, with neuropathy (HCC) 08/23/2008  . VITAMIN D DEFICIENCY 05/07/2008  . VITAMIN B12 DEFICIENCY 06/04/2007  . CHRONIC FATIGUE SYNDROME 11/15/2006  . DIVERTICULITIS, HX OF 11/15/2006  . POLYCYSTIC OVARIAN DISEASE 10/16/2006  . Hyperlipidemia LDL goal <100 10/16/2006  . Morbid obesity with body mass index of 60.0-69.9 in adult (HCC) 10/16/2006  . ANXIETY 10/16/2006  . PANIC DISORDER 10/16/2006  . BULIMIA 10/16/2006  . Depression 10/16/2006  . CARPAL TUNNEL SYNDROME 10/16/2006  . LYMPHEDEMA 10/16/2006  . ALLERGIC RHINITIS 10/16/2006  . ASTHMA, MILD, INTERMITTENT 10/16/2006  . TMJ SYNDROME 10/16/2006  . GERD 10/16/2006  . IRRITABLE BOWEL SYNDROME 10/16/2006  . OSTEOARTHRITIS 10/16/2006  . Myalgia and myositis 10/16/2006  . COUGH, CHRONIC 10/16/2006    Past Medical History:  Diagnosis Date  . Allergy    allergic rhinitis  . Anemia   . Anxiety   . Arthritis    osteoarthritis  . Asthma   . Claustrophobia    does not like oxygen mask covering face  . Depression   . Diabetes mellitus without complication (HCC)   . Fatty liver 07/2008   abd. ultrasound - fatty liver ; slt dilated cbd (no stones ) 06/10// abd.  ultrasound normal on 04/2006  . Fibromyalgia   .  GERD (gastroesophageal reflux disease)    EGD negative 06/2001// EGD erythematous mucosa, polyp 08/2008  . High uric acid in 24 hour urine specimen   . Hyperhidrosis   . Hyperlipidemia   . Kidney stones   . Lymphedema   . Neuromuscular disorder (HCC)    fibromyalgia  . Obesity   . Shortness of breath dyspnea   . Tachycardia   . TMJ (dislocation of temporomandibular joint)   . UTI (lower urinary tract infection)     Past Surgical History:  Procedure Laterality Date  . BREAST LUMPECTOMY WITH RADIOACTIVE SEED LOCALIZATION Right  08/02/2014   Procedure: BREAST LUMPECTOMY WITH RADIOACTIVE SEED LOCALIZATION;  Surgeon: Emelia Loron, MD;  Location: [facility] OR;  Service: General;  Laterality: Right;  . COLONOSCOPY    . ENDOMETRIAL BIOPSY  01/2004  . ESOPHAGOGASTRODUODENOSCOPY    . FOOT SURGERY    . SEPTOPLASTY     two times  . TONSILLECTOMY    . UTERINE FIBROID SURGERY  06/2005   ablation  . uterine tumor  09/2001  . VAGUS NERVE STIMULATOR INSERTION      Social History   Social History  . Marital status: Single    Spouse name: N/A  . Number of children: N/A  . Years of education: N/A   Occupational History  . Not on file.   Social History Main Topics  . Smoking status: Never Smoker  . Smokeless tobacco: Never Used  . Alcohol use No  . Drug use: No  . Sexual activity: Not on file   Other Topics Concern  . Not on file   Social History Narrative  . No narrative on file    Family History  Problem Relation Age of Onset  . Hypertension Father   . Cancer Father     pancreatic cancer  . Hypertension Sister   . Heart disease Maternal Grandmother 60    MI  . Diabetes Maternal Grandmother   . Heart disease Paternal Grandmother 43    MI  . Diabetes Paternal Grandmother     Allergies  Allergen Reactions  . Cephalexin   . Codeine     REACTION: ? reaction  . Duloxetine   . Hydrocodone     REACTION: ? reaction  . Levaquin [Levofloxacin] Nausea Only  . Metformin And Related Diarrhea    GI side effects   . Prednisone     Tolerates intraarticular depo-medrol.  . Quetiapine   . Sertraline Hcl     REACTION: vivid dreams  . Triazolam     REACTION: ? reaction  . Zaleplon   . Zocor [Simvastatin - High Dose]     achey  . Zolpidem Tartrate     hallucinations  . Norelgestromin-Eth Estradiol     Patch didn't stick to skin   Medication list reviewed and updated in full in [facility].  GEN: No fevers, chills. Nontoxic. Primarily MSK c/o today. MSK: Detailed in the HPI GI: tolerating PO  intake without difficulty Neuro: No numbness, parasthesias, or tingling associated. Otherwise the pertinent positives of the ROS are noted above.   Objective:   BP 118/78   Pulse 72   Temp 98.3 F (36.8 C) (Oral)   Ht 5\' 4"  (1.626 m)   Wt (!) 319 lb (144.7 kg)   BMI 54.76 kg/m    GEN: WDWN, NAD, Non-toxic, Alert & Oriented x 3 HEENT: Atraumatic, Normocephalic.  Ears and Nose: No external deformity. EXTR: No clubbing/cyanosis/diffuse lower extremity lymphedema. NEURO: Normal gait.  PSYCH: no distress.  There is crepitus on bilateral patellar with motion. She also has medial and lateral joint line tenderness. Limited motion with flexion to only about a 100 on the right.  Radiology: No results found.  Assessment and Plan:   Primary osteoarthritis of right knee  Previously with great response to Synvisc-3 series. Now with healing fractures of foot and ankle - we will avoid steroids.   Knee Injection: Synvisc-One, R Patient verbally consented to procedure. Risks (including infection), benefits, and alternatives explained. Sterilely prepped with Chloraprep. Ethyl cholride used for anesthesia, then 7 cc of Lidocaine 1% used for anesthesia in the anterolateral position. Reprepped with Chloraprep.  Anterolateral approach used to inject joint without difficulty, injected with Synvisc-One, 6 mL. No complications with procedure and tolerated well.   Follow-up: prn  Signed,  Dyrell Tuccillo T. Callie Bunyard, MD   Patient's Medications  New Prescriptions   No medications on file  Previous Medications   ACCU-CHEK AVIVA PLUS TEST STRIP    Use to check blood sugar  once a day   ACCU-CHEK SOFTCLIX LANCETS LANCETS    Use to check blood sugar  once a day   ACETAMINOPHEN (TYLENOL) 500 MG TABLET    Take 500 mg by mouth every 6 (six) hours as needed.    ALBUTEROL (PROVENTIL HFA;VENTOLIN HFA) 108 (90 BASE) MCG/ACT INHALER    Inhale 2 puffs into the lungs every 4 (four) hours as needed for wheezing.    ALLOPURINOL (ZYLOPRIM) 100 MG TABLET    Take 1 tablet (100 mg total) by mouth daily.   ALPRAZOLAM (XANAX) 1 MG TABLET    Take 2 mg by mouth 3 (three) times daily as needed.    ASPIRIN 81 MG TABLET    Take 81 mg by mouth daily.   BENZONATATE (TESSALON) 200 MG CAPSULE    TAKE ONE CAPSULE THREE TIMES A DAY AS NEEDED FOR COUGH   BLOOD GLUCOSE CALIBRATION (ACCU-CHEK AVIVA) SOLN       BUPROPION (WELLBUTRIN XL) 300 MG 24 HR TABLET       CYANOCOBALAMIN (,VITAMIN B-12,) 1000 MCG/ML INJECTION    Inject 1 mL (1,000 mcg total) into the muscle every 8 (eight) weeks.   CYCLOBENZAPRINE (FLEXERIL) 5 MG TABLET    TAKE ONE TABLET THREE TIMES A DAY AS NEEDED FOR MUSCLE SPASM   FENOFIBRATE (TRICOR) 48 MG TABLET    Take 1 tablet (48 mg total) by mouth daily.   FLUOXETINE (PROZAC) 40 MG CAPSULE    Take 80 mg by mouth daily.    FUROSEMIDE (LASIX) 20 MG TABLET    Take 1 tablet (20 mg total) by mouth daily. Prn edema   GABAPENTIN (NEURONTIN) 300 MG CAPSULE    Take 1 capsule (300 mg total) by mouth 3 (three) times daily.   GABAPENTIN (NEURONTIN) 300 MG CAPSULE    Take 1 capsule (300 mg total) by mouth 2 (two) times daily.   GLIPIZIDE (GLUCOTROL XL) 2.5 MG 24 HR TABLET    TAKE 1 TABLET BY MOUTH DAILY WITH BREAKFAST   HYDROMORPHONE (DILAUDID) 4 MG TABLET    Take 1 tablet (4 mg total) by mouth every 4 (four) hours as needed for moderate pain or severe pain.   IBUPROFEN (ADVIL,MOTRIN) 600 MG TABLET    Take 1 tablet (600 mg total) by mouth every 8 (eight) hours as needed.   NORETHINDRONE-ETHINYL ESTRADIOL (JUNEL FE,GILDESS FE,LOESTRIN FE) 1-20 MG-MCG TABLET    Take 1 tablet by mouth daily.   OMEPRAZOLE (PRILOSEC) 20 MG  CAPSULE    TAKE ONE CAPSULE TWICE A DAY   OXYBUTYNIN (DITROPAN XL) 15 MG 24 HR TABLET    TAKE 1 TABLET EVERY DAY   OXYCODONE-ACETAMINOPHEN (PERCOCET) 5-325 MG TABLET    Take 1 tablet by mouth every 6 (six) hours as needed for moderate pain or severe pain.   SPIRONOLACTONE (ALDACTONE) 100 MG TABLET    Take 100 mg  by mouth daily.    TALC (ZEASORB) POWDER    Apply topically as needed.   TAMSULOSIN (FLOMAX) 0.4 MG CAPS CAPSULE    Take 1 capsule (0.4 mg total) by mouth daily.   TRAMADOL (ULTRAM) 50 MG TABLET    TAKE ONE TABLET EVERY EIGHT HOURS AS NEEDED FOR PAIN   TRAZODONE (DESYREL) 150 MG TABLET    Take 450 mg by mouth at bedtime.    TRULICITY 1.5 MG/0.5ML SOPN    INJECT CONTENTS OF 1 PEN ONCE A WEEK  Modified Medications   No medications on file  Discontinued Medications   FLUCONAZOLE (DIFLUCAN) 150 MG TABLET    Take one pill by mouth every other day for 3 dose   SULFAMETHOXAZOLE-TRIMETHOPRIM (BACTRIM DS,SEPTRA DS) 800-160 MG TABLET    Take 1 tablet by mouth 2 (two) times daily.   TERCONAZOLE (TERAZOL 3) 0.8 % VAGINAL CREAM    Use externally to itchy areas daily as needed, try to dry area well before applying   TRULICITY 1.5 MG/0.5ML SOPN    INJECT CONTENTS OF ONE PEN ONCE A WEEK

## 2015-10-01 ENCOUNTER — Telehealth: Payer: Self-pay

## 2015-10-01 NOTE — Telephone Encounter (Signed)
Patient is on the list for [employer] 2017 and may be a good candidate for an AWV in 2017. Please let me know if/when appt is scheduled.    Not sure who is the best individual to send this to.

## 2015-10-10 LAB — HM DIABETES EYE EXAM

## 2015-10-19 ENCOUNTER — Ambulatory Visit: Payer: Medicare Other | Admitting: Podiatry

## 2015-11-16 ENCOUNTER — Encounter: Payer: Self-pay | Admitting: Podiatry

## 2015-11-16 ENCOUNTER — Ambulatory Visit (INDEPENDENT_AMBULATORY_CARE_PROVIDER_SITE_OTHER): Payer: Medicare Other | Admitting: Podiatry

## 2015-11-16 DIAGNOSIS — M79676 Pain in unspecified toe(s): Secondary | ICD-10-CM | POA: Diagnosis not present

## 2015-11-16 DIAGNOSIS — B351 Tinea unguium: Secondary | ICD-10-CM

## 2015-11-16 DIAGNOSIS — L03032 Cellulitis of left toe: Secondary | ICD-10-CM | POA: Diagnosis not present

## 2015-11-16 MED ORDER — CLINDAMYCIN HCL 150 MG PO CAPS: 150.0000 mg | ORAL_CAPSULE | Freq: Three times a day (TID) | ORAL | 0 refills | Status: DC

## 2015-11-16 NOTE — Progress Notes (Signed)
She presents today with a chief complaint of painful elongated toenails. She is also complaining of ingrown toenail to the tibial border hallux left. She states this been like this for quite some time on and off seems to be worsening.  Objective: Vital signs are stable she is alert and oriented 3. Pulses are palpable. Superficial paronychia tibial border hallux right. Her toenails are thick yellow dystrophic with mycotic with strong palpable pulses. I see no other lesions or wounds. Edema has decreased considerably from bilateral foot.  Assessment: Mild paronychia tibial border hallux left. Pain in limb secondary to onychomycosis.  Plan: Start her on clindamycin 150 mg, 1 by mouth 3 times a day 10 days. I will follow up with her in 2 weeks for the paronychia she'll start soaking at least daily in Epsom salts and water. 3 months for her routine nail debridement. We may need to consider matrixectomy again.

## 2015-11-23 ENCOUNTER — Telehealth: Payer: Self-pay | Admitting: Endocrinology

## 2015-11-23 DIAGNOSIS — N63 Unspecified lump in unspecified breast: Secondary | ICD-10-CM

## 2015-11-23 NOTE — Telephone Encounter (Signed)
Patient states it is her right breast that is bothering her. She had surgery on the same breast  Summer 2016, she states the surgeon took a "spider like mass out". She also states there is a cherry size lump on the same breast at the incision. She thinks that the lump maybe from the incision. She states her entire r/breast hurts including the nipple. The nipple has had pain in it for a couple years now. PC,CMA

## 2015-11-23 NOTE — Telephone Encounter (Signed)
Pt would like referral for diagnostic mammogram. She is pain and a bump at the end of a scar. [phone]

## 2015-11-23 NOTE — Telephone Encounter (Signed)
Referral done  Let me know if I have to change the order  Will route to [facility]

## 2015-11-23 NOTE — Telephone Encounter (Signed)
Which breast? Where is the area?   How big is the lump/bump? Is it sensitive or painful?  What is the scare from?   Thanks

## 2015-11-30 ENCOUNTER — Ambulatory Visit (INDEPENDENT_AMBULATORY_CARE_PROVIDER_SITE_OTHER): Payer: Medicare Other | Admitting: Podiatry

## 2015-11-30 ENCOUNTER — Encounter: Payer: Self-pay | Admitting: Podiatry

## 2015-11-30 DIAGNOSIS — L03032 Cellulitis of left toe: Secondary | ICD-10-CM | POA: Diagnosis not present

## 2015-11-30 MED ORDER — NEOMYCIN-POLYMYXIN-HC 3.5-10000-1 OT SOLN: OTIC | 0 refills | Status: DC

## 2015-11-30 NOTE — Patient Instructions (Signed)

## 2015-12-01 NOTE — Progress Notes (Signed)
She presents today chief complaint of a painful hallux nail left. She states it's ingrowing again I does need a side cut out.  Objective: Vital signs are stable she is alert and oriented 3. Pulses are palpable. Sharp and greater nail margin along the tibial border hallux left with erythema and edema. The smaller tender on palpation.  Assessment: Chronic ingrown toenail tibial border of the hallux left.  Plan: We will perform chemical matrixectomy today after local anesthesia was nourished. She tolerated procedure very well without complications. She was provided with both oral and home-going instructions for care setting of her toe twice daily. Also provided her with a prescription for Cortisporin Otic to be applied twice daily. Follow-up with her in 2 weeks

## 2015-12-02 ENCOUNTER — Telehealth: Payer: Self-pay | Admitting: Endocrinology

## 2015-12-02 ENCOUNTER — Encounter: Payer: Self-pay | Admitting: Family Medicine

## 2015-12-02 ENCOUNTER — Ambulatory Visit (INDEPENDENT_AMBULATORY_CARE_PROVIDER_SITE_OTHER): Payer: Medicare Other | Admitting: Family Medicine

## 2015-12-02 VITALS — BP 128/64 | HR 82 | Temp 97.7°F | Wt 322.2 lb

## 2015-12-02 DIAGNOSIS — J4520 Mild intermittent asthma, uncomplicated: Secondary | ICD-10-CM

## 2015-12-02 DIAGNOSIS — B9789 Other viral agents as the cause of diseases classified elsewhere: Secondary | ICD-10-CM

## 2015-12-02 DIAGNOSIS — J3089 Other allergic rhinitis: Secondary | ICD-10-CM

## 2015-12-02 DIAGNOSIS — R32 Unspecified urinary incontinence: Secondary | ICD-10-CM | POA: Insufficient documentation

## 2015-12-02 DIAGNOSIS — J329 Chronic sinusitis, unspecified: Secondary | ICD-10-CM

## 2015-12-02 LAB — POC URINALSYSI DIPSTICK (AUTOMATED)
BILIRUBIN UA: NEGATIVE
GLUCOSE UA: NEGATIVE
KETONES UA: NEGATIVE
Nitrite, UA: NEGATIVE
PH UA: 6
Protein, UA: NEGATIVE
Urobilinogen, UA: 0.2

## 2015-12-02 MED ORDER — ALBUTEROL SULFATE HFA 108 (90 BASE) MCG/ACT IN AERS: 2.0000 | INHALATION_SPRAY | RESPIRATORY_TRACT | 3 refills | Status: DC | PRN

## 2015-12-02 MED ORDER — BENZONATATE 200 MG PO CAPS: 200.0000 mg | ORAL_CAPSULE | Freq: Three times a day (TID) | ORAL | 0 refills | Status: DC | PRN

## 2015-12-02 NOTE — Progress Notes (Signed)
Pre visit review using our clinic review tool, if applicable. No additional management support is needed unless otherwise documented below in the visit note. 

## 2015-12-02 NOTE — Telephone Encounter (Signed)
[place] Patient Name: Mackenzie Bonilla DOB: 1965-09-06 Initial Comment Caller says, has a bad sinus infection, head throbbing for a couple of days, pain under both eye, and teeth hurt. Nurse Assessment Nurse: Dimas Chyle, RN, Dellis Filbert Date/Time Eilene Ghazi Time): 12/02/2015 11:43:42 AM Confirm and document reason for call. If symptomatic, describe symptoms. You must click the next button to save text entered. ---Caller says, has a bad sinus infection, head throbbing for a couple of days, pain under both eye, and teeth hurt. Symptoms started 2 days ago. Possible low grade fever. Has the patient traveled out of the country within the last 30 days? ---No Does the patient have any new or worsening symptoms? ---Yes Will a triage be completed? ---Yes Related visit to physician within the last 2 weeks? ---No Does the PT have any chronic conditions? (i.e. diabetes, asthma, etc.) ---Yes List chronic conditions. ---Diabetes type 2 Is the patient pregnant or possibly pregnant? (Ask all females between the ages of 33-55) ---No Is this a behavioral health or substance abuse call? ---No Guidelines Guideline Title Affirmed Question Affirmed Notes Sinus Pain or Congestion Earache Final Disposition User See Physician within 24 Hours Dimas Chyle, RN, [employer] Referrals REFERRED TO PCP OFFICE Disagree/Comply: Leta Baptist

## 2015-12-02 NOTE — Assessment & Plan Note (Signed)
No sign of bacterial infection. Likely viral .. Treat symptomatically.

## 2015-12-02 NOTE — Assessment & Plan Note (Signed)
No current exacerbation, but given history will refill pt's albuterol to use prn wheeze if develops.

## 2015-12-02 NOTE — Progress Notes (Signed)
   Subjective:    Patient ID: Mackenzie Bonilla, female    DOB: 1965-10-31, 50 y.o.   MRN: [id number]  Sinus Problem  This is a new problem. The current episode started in the past 7 days ( 3 days). The problem has been gradually worsening since onset. There has been no fever. Her pain is at a severity of 5/10. The pain is moderate. Associated symptoms include congestion, coughing, ear pain, headaches and sinus pressure. Pertinent negatives include no shortness of breath, sneezing or sore throat. (Teeth hurting Watery itchy eyes.  Bilateral ear pain, left greater. White mucus. Feels like chest fluttering. She is under a lot of stress trying to sell her condo.) Past treatments include acetaminophen (benzonatate on in past for chronic cough.. this helped in past, uses loratidine.. does not help much,  request refill of albuterol for cough.). The treatment provided no relief.    She has moved to old  apartment, with dust and moving  She has also noted urinary incontinence in last few days.. Uncontrollable. No dysuria, no blood in urine.    HX of morbid obesity and DM. On antibiotics ( clindamycin) in last month for toe nail, now on topical neomycin on toe.Marland Kitchen  HX of asthma mild, intermittant.. Use albuterol prn at times Nonsmoker  Review of Systems  HENT: Positive for congestion, ear pain and sinus pressure. Negative for sneezing and sore throat.   Respiratory: Positive for cough. Negative for shortness of breath.   Neurological: Positive for headaches.       Objective:   Physical Exam  Constitutional: Vital signs are normal. She appears well-developed and well-nourished. She is cooperative.  Non-toxic appearance. She does not appear ill. No distress.  Morbid obesity  HENT:  Head: Normocephalic.  Right Ear: Hearing, external ear and ear canal normal. Tympanic membrane is not erythematous, not retracted and not bulging. A middle ear effusion is present.  Left Ear: Hearing, external ear  and ear canal normal. Tympanic membrane is not erythematous, not retracted and not bulging. A middle ear effusion is present.  Nose: Mucosal edema and rhinorrhea present. Right sinus exhibits maxillary sinus tenderness. Right sinus exhibits no frontal sinus tenderness. Left sinus exhibits maxillary sinus tenderness. Left sinus exhibits no frontal sinus tenderness.  Mouth/Throat: Uvula is midline, oropharynx is clear and moist and mucous membranes are normal. No oropharyngeal exudate, posterior oropharyngeal edema or posterior oropharyngeal erythema.  Eyes: Conjunctivae, EOM and lids are normal. Pupils are equal, round, and reactive to light. Lids are everted and swept, no foreign bodies found.  Neck: Trachea normal and normal range of motion. Neck supple. Carotid bruit is not present. No thyroid mass and no thyromegaly present.  Cardiovascular: Normal rate, regular rhythm, S1 normal, S2 normal, normal heart sounds, intact distal pulses and normal pulses.  Exam reveals no gallop and no friction rub.   No murmur heard. Pulmonary/Chest: Effort normal and breath sounds normal. No tachypnea. No respiratory distress. She has no decreased breath sounds. She has no wheezes. She has no rhonchi. She has no rales.  Neurological: She is alert.  Skin: Skin is warm, dry and intact. No rash noted.  Psychiatric: Her speech is normal and behavior is normal. Judgment normal. Her mood appears not anxious. Cognition and memory are normal. She does not exhibit a depressed mood.          Assessment & Plan:

## 2015-12-02 NOTE — Assessment & Plan Note (Signed)
Change to Xyzal pr zyrtec at bedtime and add flonase or other nasal steroid as well as nasal saline spray.

## 2015-12-02 NOTE — Telephone Encounter (Signed)
Pt has appt with Dr Diona Browner 12/02/15 at 1:15.

## 2015-12-02 NOTE — Addendum Note (Signed)
Addended by: Modena Nunnery on: 12/02/2015 02:29 PM   Modules accepted: Orders

## 2015-12-02 NOTE — Assessment & Plan Note (Addendum)
No other urinary symptoms. Likely secondary to cough but with hx of frequent UTIs, will eval with UA CC. Positive for wbc and rbc on dip..Sample tossed by CMA before micro spun down. Possible contamination given pt's habitus.  Will send for culture.

## 2015-12-02 NOTE — Patient Instructions (Addendum)
Benzonatate for cough . Change to Xyzal pr zyrtec at bedtime and add flonase or other nasal steroid  If tolerated. Also add nasal saline spray or irrigation to flush sinuses. Albuterol sent in to use as needed for wheeze or shortness of breath only. Call if not improving after 7-10 days of illness or if mood worsening.  I doubt a urinary infeciton, but we will send for culture and call to let you know the results.

## 2015-12-05 ENCOUNTER — Other Ambulatory Visit: Payer: Self-pay | Admitting: Family Medicine

## 2015-12-05 LAB — URINE CULTURE

## 2015-12-06 ENCOUNTER — Telehealth: Payer: Self-pay | Admitting: Family Medicine

## 2015-12-06 MED ORDER — SULFAMETHOXAZOLE-TRIMETHOPRIM 800-160 MG PO TABS: 1.0000 | ORAL_TABLET | Freq: Two times a day (BID) | ORAL | 0 refills | Status: DC

## 2015-12-06 NOTE — Telephone Encounter (Signed)
Notify pt her urine did grow Klebsiella.. Needs to complete course of  TMP/SMX.Marland Kitchensent in. Have her call if symptoms not resolved after antibitoics.

## 2015-12-09 ENCOUNTER — Ambulatory Visit
Admission: RE | Admit: 2015-12-09 | Discharge: 2015-12-09 | Disposition: A | Discharge status: Home / Self Care | Payer: Medicare Other | Source: Ambulatory Visit | Attending: Family Medicine | Admitting: Family Medicine

## 2015-12-09 DIAGNOSIS — N63 Unspecified lump in unspecified breast: Secondary | ICD-10-CM

## 2015-12-09 DIAGNOSIS — N6310 Unspecified lump in the right breast, unspecified quadrant: Secondary | ICD-10-CM | POA: Insufficient documentation

## 2015-12-09 LAB — HM MAMMOGRAPHY

## 2015-12-12 ENCOUNTER — Encounter: Payer: Self-pay | Admitting: *Deleted

## 2015-12-14 ENCOUNTER — Ambulatory Visit: Payer: Medicare Other | Admitting: Podiatry

## 2015-12-19 ENCOUNTER — Other Ambulatory Visit: Payer: Self-pay | Admitting: Family Medicine

## 2015-12-20 NOTE — Telephone Encounter (Signed)
She sees a lot of specialists and does not come here as often -also difficult for her to get out  I will refill electronically

## 2015-12-20 NOTE — Telephone Encounter (Signed)
Pt has had a few recent acute appts with other providers but no recent f/u or CPE, please advise

## 2015-12-26 ENCOUNTER — Ambulatory Visit: Payer: Medicare Other | Admitting: Podiatry

## 2015-12-30 ENCOUNTER — Other Ambulatory Visit: Payer: Self-pay | Admitting: Family Medicine

## 2016-01-25 ENCOUNTER — Ambulatory Visit: Payer: Medicare Other | Admitting: Podiatry

## 2016-01-30 ENCOUNTER — Ambulatory Visit: Payer: Medicare Other | Admitting: Podiatry

## 2016-01-30 ENCOUNTER — Encounter: Payer: Self-pay | Admitting: Podiatry

## 2016-01-30 ENCOUNTER — Ambulatory Visit (INDEPENDENT_AMBULATORY_CARE_PROVIDER_SITE_OTHER): Payer: Medicare Other | Admitting: Podiatry

## 2016-01-30 DIAGNOSIS — M79676 Pain in unspecified toe(s): Secondary | ICD-10-CM

## 2016-01-30 DIAGNOSIS — E1142 Type 2 diabetes mellitus with diabetic polyneuropathy: Secondary | ICD-10-CM

## 2016-01-30 DIAGNOSIS — B351 Tinea unguium: Secondary | ICD-10-CM | POA: Diagnosis not present

## 2016-01-30 DIAGNOSIS — M779 Enthesopathy, unspecified: Secondary | ICD-10-CM

## 2016-01-30 NOTE — Progress Notes (Signed)
She presents today for complaint of painful elongated toenails and a painful right ankle.  Objective: She has pain on palpation of the sinus tarsi right ankle as well as him in range of motion of the right foot. Toenails are long thick yellow dystrophic with mycotic.  Assessment: Diabetes mellitus, obesity, pain in limb secondary to onychomycosis and capsulitis of the subtalar joint right.  Plan: Injected the subtalar joint today right with Kenalog and local anesthetic. Debridement of nails 1 through 5 bilateral.

## 2016-03-07 ENCOUNTER — Other Ambulatory Visit: Payer: Self-pay | Admitting: Family Medicine

## 2016-03-07 NOTE — Telephone Encounter (Signed)
No recent/future appts with Dr. Glori Bickers but pt has had a few acute appts with other providers, last filled on 08/03/14 #30 tabs with 2 additional refills, please advise

## 2016-03-07 NOTE — Telephone Encounter (Signed)
Will refill electronically  

## 2016-04-04 ENCOUNTER — Ambulatory Visit (INDEPENDENT_AMBULATORY_CARE_PROVIDER_SITE_OTHER): Payer: Medicare Other | Admitting: Podiatry

## 2016-04-04 DIAGNOSIS — M79676 Pain in unspecified toe(s): Secondary | ICD-10-CM

## 2016-04-04 DIAGNOSIS — B351 Tinea unguium: Secondary | ICD-10-CM

## 2016-04-04 DIAGNOSIS — E1151 Type 2 diabetes mellitus with diabetic peripheral angiopathy without gangrene: Secondary | ICD-10-CM | POA: Diagnosis not present

## 2016-04-04 NOTE — Progress Notes (Signed)
She presents today for follow-up of her diabetic peripheral neuropathy complaining of painful elongated toenails.  Objective: Vital signs are stable she is alert and oriented 3 pulses remain palpable. No change in neuropathic findings. Toenails are long thick yellow dystrophic onychomycotic.  Assessment: Diabetes mellitus with diabetic peripheral neuropathy and painful elongated toenails.  Plan: Debridement of toenails 1 through 5 bilateral.  Note: Remember to ask how the wetting went.

## 2016-04-25 ENCOUNTER — Ambulatory Visit: Payer: Self-pay | Admitting: *Deleted

## 2016-05-02 ENCOUNTER — Ambulatory Visit: Payer: Self-pay | Admitting: *Deleted

## 2016-05-16 ENCOUNTER — Ambulatory Visit: Payer: Self-pay | Admitting: *Deleted

## 2016-06-06 ENCOUNTER — Ambulatory Visit: Payer: Medicare Other | Admitting: Podiatry

## 2016-06-11 ENCOUNTER — Ambulatory Visit (INDEPENDENT_AMBULATORY_CARE_PROVIDER_SITE_OTHER): Payer: Medicare Other | Admitting: Podiatry

## 2016-06-11 DIAGNOSIS — E1142 Type 2 diabetes mellitus with diabetic polyneuropathy: Secondary | ICD-10-CM

## 2016-06-11 DIAGNOSIS — B351 Tinea unguium: Secondary | ICD-10-CM

## 2016-06-11 DIAGNOSIS — M79676 Pain in unspecified toe(s): Secondary | ICD-10-CM | POA: Diagnosis not present

## 2016-06-11 DIAGNOSIS — L600 Ingrowing nail: Secondary | ICD-10-CM

## 2016-06-11 NOTE — Progress Notes (Signed)
This patient presents the office for continued evaluation of her left big toe. She says that she is now having pain at the tip of the nail as it grows following her previous nail surgery. This is painful walking and wearing her shoes. She says the nail surgery has healed well. She also says that she stumped her third toe of the right foot, which is now causing pain and discomfort. She she requests that the third toe B examined for pathology. She presents for an evaluation and treatment of her diabetic feet  GENERAL APPEARANCE: Alert, conversant. Appropriately groomed. No acute distress.  VASCULAR: Pedal pulses are  palpable at  [facility] and PT bilateral.  Capillary refill time is immediate to all digits,  Normal temperature gradient.   NEUROLOGIC: sensation is diminished  to 5.07 monofilament at 5/5 sites bilateral.  Light touch is intact bilateral, Muscle strength normal.  MUSCULOSKELETAL: acceptable muscle strength, tone and stability bilateral.  Intrinsic muscluature intact bilateral.  Rectus appearance of foot and digits noted bilateral. No evidence of any redness, swelling or bruising in the third toe, right foot.  DERMATOLOGIC: skin color, texture, and turgor are within normal limits.  No preulcerative lesions or ulcers  are seen, no interdigital maceration noted.  No open lesions present.  . No drainage noted. NAILS  thick disfigured discolored nails on both feet. She also has the distal aspect of the left hallux growing into the tip of her toe. No evidence of any redness, swelling or infection  Diagnosis  Onychomycosis  Diabetes with neuropathy.  ROV  Debridement of nails.  Examination of her third toe revealed no evidence of any redness, swelling, ecchymosis or pain. I offered to take an x-ray to examine her toe and she decided not to have the x-rays taken. She requested that I buddy taped the toe for her'   Gardiner Barefoot [facility]

## 2016-07-16 ENCOUNTER — Other Ambulatory Visit: Payer: Self-pay | Admitting: Family Medicine

## 2016-07-16 NOTE — Telephone Encounter (Signed)
No recent/future appts. with you, please advise

## 2016-07-16 NOTE — Telephone Encounter (Signed)
Please schedule a late summer f/u and refill until then

## 2016-07-16 NOTE — Telephone Encounter (Signed)
F/u appt scheduled and med refilled 

## 2016-08-01 ENCOUNTER — Encounter: Payer: Self-pay | Admitting: Primary Care

## 2016-08-01 ENCOUNTER — Ambulatory Visit (INDEPENDENT_AMBULATORY_CARE_PROVIDER_SITE_OTHER): Payer: Medicare Other | Admitting: Primary Care

## 2016-08-01 ENCOUNTER — Ambulatory Visit: Payer: Self-pay | Admitting: Family Medicine

## 2016-08-01 VITALS — BP 126/80 | HR 94 | Temp 98.1°F | Wt 332.8 lb

## 2016-08-01 DIAGNOSIS — J0100 Acute maxillary sinusitis, unspecified: Secondary | ICD-10-CM

## 2016-08-01 DIAGNOSIS — H6121 Impacted cerumen, right ear: Secondary | ICD-10-CM

## 2016-08-01 MED ORDER — AZITHROMYCIN 250 MG PO TABS: ORAL_TABLET | ORAL | 0 refills | Status: DC

## 2016-08-01 NOTE — Patient Instructions (Addendum)
Start Azithromycin antibiotics. Take 2 tablets by mouth today, then 1 tablet daily for 4 additional days.  Nasal Congestion/Ear Pressure: Try using Flonase (fluticasone) nasal spray. Instill 1 spray in each nostril twice daily.   Use Neti Pot Rinses as needed to help clean out sinuses.   It was a pleasure meeting you!

## 2016-08-01 NOTE — Progress Notes (Signed)
Subjective:    Patient ID: Mackenzie Bonilla, female    DOB: 1965/09/02, 51 y.o.   MRN: [id number]  HPI  Mackenzie Bonilla is a 51 year old female with a history of allergic rhinitis, mild intermittent asthma, sinusitis who presents today with a chief complaint of sinus pressure. Mackenzie Bonilla also reports ear fullness (right ear), nasal congestion, cough. Her sinus pressure is on the right maxillary side. Her symptoms began 3 weeks ago. Mackenzie Bonilla's expelling copious amounts of thick, yellow mucous from her nasal cavity that has recently progressed.  Mackenzie Bonilla underwent dental surgery in April/May for a retained tooth to her right upper molar.   Mackenzie Bonilla's taken tylenol, ear wax removal drops, and OTC "allergy medicine" with some improvement. Mackenzie Bonilla denies fevers. Mackenzie Bonilla was last on antibiotics one month ago due to her dental infection.   Review of Systems  HENT: Positive for congestion, sinus pain and sinus pressure. Negative for sore throat.        Right ear fullness  Respiratory: Positive for cough. Negative for shortness of breath and wheezing.        Past Medical History:  Diagnosis Date  . Allergy    allergic rhinitis  . Anemia   . Anxiety   . Arthritis    osteoarthritis  . Asthma   . Claustrophobia    does not like oxygen mask covering face  . Depression   . Diabetes mellitus without complication ([place])   . Fatty liver 07/2008   abd. ultrasound - fatty liver ; slt dilated cbd (no stones ) 06/10// abd.  ultrasound normal on 04/2006  . Fibromyalgia   . GERD (gastroesophageal reflux disease)    EGD negative 06/2001// EGD erythematous mucosa, polyp 08/2008  . High uric acid in 24 hour urine specimen   . Hyperhidrosis   . Hyperlipidemia   . Kidney stones   . Lymphedema   . Neuromuscular disorder (HCC)    fibromyalgia  . Obesity   . Shortness of breath dyspnea   . Tachycardia   . TMJ (dislocation of temporomandibular joint)   . UTI (lower urinary tract infection)      Social History   Social History    . Marital status: Single    Spouse name: N/A  . Number of children: N/A  . Years of education: N/A   Occupational History  . Not on file.   Social History Main Topics  . Smoking status: Never Smoker  . Smokeless tobacco: Never Used  . Alcohol use No  . Drug use: No  . Sexual activity: Not on file   Other Topics Concern  . Not on file   Social History Narrative  . No narrative on file    Past Surgical History:  Procedure Laterality Date  . BREAST BIOPSY Right 2016   neg  . BREAST LUMPECTOMY WITH RADIOACTIVE SEED LOCALIZATION Right 08/02/2014   Procedure: BREAST LUMPECTOMY WITH RADIOACTIVE SEED LOCALIZATION;  Surgeon: Rolm Bookbinder, MD;  Location: [facility];  Service: General;  Laterality: Right;  . COLONOSCOPY    . ENDOMETRIAL BIOPSY  01/2004  . ESOPHAGOGASTRODUODENOSCOPY    . FOOT SURGERY    . SEPTOPLASTY     two times  . TONSILLECTOMY    . UTERINE FIBROID SURGERY  06/2005   ablation  . uterine tumor  09/2001  . VAGUS NERVE STIMULATOR INSERTION      Family History  Problem Relation Age of Onset  . Hypertension Father   . Cancer Father  pancreatic cancer  . Hypertension Sister   . Heart disease Maternal Grandmother 60       MI  . Diabetes Maternal Grandmother   . Heart disease Paternal Grandmother 91       MI  . Diabetes Paternal Grandmother   . Breast cancer Neg Hx     Allergies  Allergen Reactions  . Cephalexin   . Codeine     REACTION: ? reaction  . Duloxetine   . Hydrocodone     REACTION: ? reaction  . Levaquin [Levofloxacin] Nausea Only  . Metformin And Related Diarrhea    GI side effects   . Prednisone     Tolerates intraarticular depo-medrol.  . Quetiapine   . Sertraline Hcl     REACTION: vivid dreams  . Triazolam     REACTION: ? reaction  . Zaleplon   . Zocor [Simvastatin - High Dose]     achey  . Zolpidem Tartrate     hallucinations  . Norelgestromin-Eth Estradiol     Patch didn't stick to skin    Current Outpatient  Prescriptions on File Prior to Visit  Medication Sig Dispense Refill  . ACCU-CHEK AVIVA PLUS test strip Use to check blood sugar  once a day 100 each 0  . ACCU-CHEK SOFTCLIX LANCETS lancets Use to check blood sugar  once a day 100 each 0  . acetaminophen (TYLENOL) 500 MG tablet Take 500 mg by mouth every 6 (six) hours as needed.     Marland Kitchen albuterol (PROVENTIL HFA;VENTOLIN HFA) 108 (90 Base) MCG/ACT inhaler Inhale 2 puffs into the lungs every 4 (four) hours as needed for wheezing. 1 Inhaler 3  . allopurinol (ZYLOPRIM) 100 MG tablet TAKE ONE TABLET BY MOUTH DAILY 30 tablet 5  . ALPRAZolam (XANAX) 1 MG tablet Take 2 mg by mouth 3 (three) times daily as needed.     Marland Kitchen aspirin 81 MG tablet Take 81 mg by mouth daily.    . benzonatate (TESSALON) 200 MG capsule Take 1 capsule (200 mg total) by mouth 3 (three) times daily as needed for cough. 20 capsule 0  . Blood Glucose Calibration (ACCU-CHEK AVIVA) SOLN     . buPROPion (WELLBUTRIN XL) 300 MG 24 hr tablet     . cyanocobalamin (,VITAMIN B-12,) 1000 MCG/ML injection Inject 1 mL (1,000 mcg total) into the muscle every 8 (eight) weeks. 1 mL 3  . cyclobenzaprine (FLEXERIL) 5 MG tablet TAKE ONE TABLET THREE TIMES A DAY IF NEEDED FOR MUSCLE SPASM 30 tablet 3  . fenofibrate (TRICOR) 48 MG tablet Take 1 tablet (48 mg total) by mouth daily. 30 tablet 11  . FLUoxetine (PROZAC) 40 MG capsule Take 80 mg by mouth daily.     . furosemide (LASIX) 20 MG tablet Take 1 tablet (20 mg total) by mouth daily. Prn edema 30 tablet 3  . gabapentin (NEURONTIN) 300 MG capsule Take 1 capsule (300 mg total) by mouth 2 (two) times daily. 60 capsule 11  . HYDROmorphone (DILAUDID) 4 MG tablet Take 1 tablet (4 mg total) by mouth every 4 (four) hours as needed for moderate pain or severe pain. 30 tablet 0  . ibuprofen (ADVIL,MOTRIN) 600 MG tablet Take 1 tablet (600 mg total) by mouth every 8 (eight) hours as needed. 30 tablet 0  . neomycin-polymyxin-hydrocortisone (CORTISPORIN) otic solution  Apply one to two drops to toe after soaking twice daily. 10 mL 0  . norethindrone-ethinyl estradiol (JUNEL FE,GILDESS FE,LOESTRIN FE) 1-20 MG-MCG tablet Take 1 tablet by mouth  daily.    . omeprazole (PRILOSEC) 20 MG capsule TAKE ONE CAPSULE TWICE A DAY 60 capsule 11  . oxybutynin (DITROPAN XL) 15 MG 24 hr tablet TAKE 1 TABLET BY MOUTH DAILY 90 tablet 0  . oxyCODONE-acetaminophen (PERCOCET) 5-325 MG tablet Take 1 tablet by mouth every 6 (six) hours as needed for moderate pain or severe pain. 15 tablet 0  . spironolactone (ALDACTONE) 100 MG tablet Take 100 mg by mouth daily.     Marland Kitchen sulfamethoxazole-trimethoprim (BACTRIM DS,SEPTRA DS) 800-160 MG tablet Take 1 tablet by mouth 2 (two) times daily. 10 tablet 0  . talc (ZEASORB) powder Apply topically as needed. (Patient taking differently: Apply 1 application topically daily as needed (sweat). ) 240 g 3  . tamsulosin (FLOMAX) 0.4 MG CAPS capsule Take 1 capsule (0.4 mg total) by mouth daily. 30 capsule 0  . traMADol (ULTRAM) 50 MG tablet TAKE ONE TABLET EVERY EIGHT HOURS AS NEEDED FOR PAIN 60 tablet 1  . traZODone (DESYREL) 150 MG tablet Take 450 mg by mouth at bedtime.     . TRULICITY 1.5 LG/9.2JJ SOPN INJECT CONTENTS OF 1 PEN ONCE A WEEK 2 mL 3   No current facility-administered medications on file prior to visit.     BP 126/80   Pulse 94   Temp 98.1 F (36.7 C) (Oral)   Wt (!) 332 lb 12.8 oz (151 kg)   SpO2 95%   BMI 57.12 kg/m    Objective:   Physical Exam  Constitutional: Mackenzie Bonilla appears well-nourished. Mackenzie Bonilla does not appear ill.  HENT:  Right Ear: Tympanic membrane and ear canal normal.  Left Ear: Tympanic membrane and ear canal normal.  Nose: Mucosal edema present. Right sinus exhibits maxillary sinus tenderness. Right sinus exhibits no frontal sinus tenderness. Left sinus exhibits no maxillary sinus tenderness and no frontal sinus tenderness.  Mouth/Throat: Oropharynx is clear and moist.  Cerumen impaction to right canal. TM and canal  post irrigation unremarkable.  Eyes: Conjunctivae are normal.  Neck: Neck supple.  Cardiovascular: Normal rate and regular rhythm.   Pulmonary/Chest: Effort normal and breath sounds normal. Mackenzie Bonilla has no wheezes. Mackenzie Bonilla has no rales.  Lymphadenopathy:    Mackenzie Bonilla has no cervical adenopathy.  Skin: Skin is warm and dry.          Assessment & Plan:  Sinusitis:  Right maxillary pressure/pain x 3 weeks. Exam today with tenderness. Oral cavity overall unremarkable. Given examination today and duration of symptoms, will treat for possible bacterial involvement.  Rx for Zpak sent to pharmacy (Beta-Lactam allergy). Discussed potential harms for recurrent antibiotic use. Discussed use of nasal sprays, neti pot rinses. Right ear irrigated due to cerumen impaction. TM and canal post irrigation unremarkable.  Sheral Flow, NP

## 2016-08-08 ENCOUNTER — Ambulatory Visit (INDEPENDENT_AMBULATORY_CARE_PROVIDER_SITE_OTHER): Payer: Medicare Other | Admitting: Family Medicine

## 2016-08-08 ENCOUNTER — Encounter: Payer: Self-pay | Admitting: Family Medicine

## 2016-08-08 VITALS — BP 136/78 | HR 99 | Temp 98.3°F | Wt 333.5 lb

## 2016-08-08 DIAGNOSIS — M170 Bilateral primary osteoarthritis of knee: Secondary | ICD-10-CM | POA: Diagnosis not present

## 2016-08-08 MED ORDER — HYLAN G-F 20 48 MG/6ML IX SOSY
48.0000 [IU] | PREFILLED_SYRINGE | Freq: Once | INTRA_ARTICULAR | Status: AC
  Administered 2016-08-08: 48 [IU] via INTRA_ARTICULAR

## 2016-08-08 MED ORDER — ALBUTEROL SULFATE HFA 108 (90 BASE) MCG/ACT IN AERS: 2.0000 | INHALATION_SPRAY | RESPIRATORY_TRACT | 3 refills | Status: DC | PRN

## 2016-08-08 NOTE — Progress Notes (Signed)
Dr. Karleen Hampshire T. Ori Kreiter, MD, CAQ [facility] [address] [place], [address] Phone: [phone] Fax: [phone]  08/08/2016  Patient: Mackenzie Bonilla, MRN: [id number], DOB: 1965/11/04, 51 y.o.  Primary Physician:  Tower, Audrie Gallus, MD   Chief Complaint  Patient presents with  . Knee Pain    both knee  . ankle pain    both ankle   Subjective:   [name] is a 51 y.o. very pleasant female patient who presents with the following:  B synvisc inj-one, Procedure only.  Patient has known B osteoarthritis of both knees. She also has a history of traumatic significant fracture on the right ankle.  She presents today with bilateral worsening of her knee pain off and on for months. She is having a good response to Hyaluronic acid injections in the past.   Arthritis of both knees  We're going to do a repeat Synvisc injection today.  Knee Injection: Synvisc-One, R Patient verbally consented to procedure. Risks (including infection), benefits, and alternatives explained. Sterilely prepped with Chloraprep. Ethyl cholride used for anesthesia, then 7 cc of Lidocaine 1% used for anesthesia in the anterolateral position. Reprepped with Chloraprep.  Anterolateral approach used to inject joint without difficulty, injected with Synvisc-One, 6 mL. No complications with procedure and tolerated well.   Knee Injection: Synvisc-One, L Patient verbally consented to procedure. Risks (including infection), benefits, and alternatives explained. Sterilely prepped with Chloraprep. Ethyl cholride used for anesthesia, then 7 cc of Lidocaine 1% used for anesthesia in the anterolateral position. Reprepped with Chloraprep.  Anterolateral approach used to inject joint without difficulty, injected with Synvisc-One, 6 mL. No complications with procedure and tolerated well.   Follow-up: prn  Signed,  Jamarii Banks T. Daylan Boggess, MD

## 2016-08-14 NOTE — Progress Notes (Signed)
Thanks! Yes.

## 2016-08-17 ENCOUNTER — Other Ambulatory Visit: Payer: Self-pay | Admitting: Family Medicine

## 2016-08-17 NOTE — Telephone Encounter (Signed)
Last filled on 07/18/15 #60 caps with 11 additional refills, f/u scheduled 09/18/16

## 2016-08-20 ENCOUNTER — Ambulatory Visit (INDEPENDENT_AMBULATORY_CARE_PROVIDER_SITE_OTHER): Payer: Medicare Other | Admitting: Podiatry

## 2016-08-20 DIAGNOSIS — M79676 Pain in unspecified toe(s): Secondary | ICD-10-CM

## 2016-08-20 DIAGNOSIS — B351 Tinea unguium: Secondary | ICD-10-CM

## 2016-08-20 DIAGNOSIS — E1142 Type 2 diabetes mellitus with diabetic polyneuropathy: Secondary | ICD-10-CM

## 2016-08-20 DIAGNOSIS — E1151 Type 2 diabetes mellitus with diabetic peripheral angiopathy without gangrene: Secondary | ICD-10-CM

## 2016-08-20 NOTE — Progress Notes (Signed)
This patient presents the office for continued evaluation of her long thick nails.  The nails are painful walking and wearing her shoes.  She has distal red skin left big toe which she wants checked to make sure there is no infection.  She presents for preventive foot care services.  GENERAL APPEARANCE: Alert, conversant. Appropriately groomed. No acute distress.  VASCULAR: Pedal pulses are  palpable at  [facility] and PT bilateral.  Capillary refill time is immediate to all digits,  Normal temperature gradient.   NEUROLOGIC: sensation is diminished  to 5.07 monofilament at 5/5 sites bilateral.  Light touch is intact bilateral, Muscle strength normal.  MUSCULOSKELETAL: acceptable muscle strength, tone and stability bilateral.  Intrinsic muscluature intact bilateral.  Rectus appearance of foot and digits noted bilateral. IPJ dorsiflexed at IPJ  Left greater than right.  DERMATOLOGIC: skin color, texture, and turgor are within normal limits.  No preulcerative lesions or ulcers  are seen, no interdigital maceration noted.  No open lesions present.  . No drainage noted. NAILS  thick disfigured discolored nails on both feet. She also has the distal aspect of the left hallux growing into the tip of her toe. No evidence of any redness, swelling or infection  Diagnosis  Onychomycosis  Diabetes with neuropathy.  ROV  Debridement of nails.  Her third toe right foot has healed.  RTC 10 weeks.   Gardiner Barefoot DPM

## 2016-08-27 ENCOUNTER — Other Ambulatory Visit: Payer: Self-pay | Admitting: Family Medicine

## 2016-08-29 ENCOUNTER — Ambulatory Visit: Payer: Medicare Other | Admitting: Podiatry

## 2016-09-05 ENCOUNTER — Encounter: Payer: Self-pay | Admitting: Family Medicine

## 2016-09-05 ENCOUNTER — Telehealth: Payer: Self-pay | Admitting: Family Medicine

## 2016-09-05 ENCOUNTER — Ambulatory Visit (INDEPENDENT_AMBULATORY_CARE_PROVIDER_SITE_OTHER): Payer: Medicare Other | Admitting: Family Medicine

## 2016-09-05 VITALS — BP 122/72 | HR 85 | Temp 98.3°F | Wt 336.8 lb

## 2016-09-05 DIAGNOSIS — R1084 Generalized abdominal pain: Secondary | ICD-10-CM | POA: Diagnosis not present

## 2016-09-05 DIAGNOSIS — Z6841 Body Mass Index (BMI) 40.0 and over, adult: Secondary | ICD-10-CM

## 2016-09-05 DIAGNOSIS — R197 Diarrhea, unspecified: Secondary | ICD-10-CM | POA: Insufficient documentation

## 2016-09-05 NOTE — Telephone Encounter (Signed)
Pt has appt with Dr Glori Bickers today at [facility].

## 2016-09-05 NOTE — Progress Notes (Signed)
Subjective:    Patient ID: Mackenzie Bonilla, female    DOB: 1965/10/09, 51 y.o.   MRN: [id number]  HPI  51 yo here with GI symptoms    Complains of abd cramping and bloating over lower abd for 2-3 wk No sick contacts  Some watery stools (dark brown) no blood  Eating BRAT diet 2-3 days  Urgency when she gets up in the am  Sometimes is woken up by need to have bm  Diet - not great /lately trying to eat a bland diet   Could not remember what she ate before this started No particular food triggers (tends to steer clear of lactose)  Tried immodium and dramamine  Nausea w/o vomiting   She just finished 20th session for [place] treatment -unsure if helpful  Sees psychiatrist  (having a hard time -her mother does not know and does not believe in psychiatric care )   Has DM Not checking blood sugars Sees Dr Mackenzie Bonilla   Last abx was in May ? Struggling with dental problems after having tooth pulled    Colonoscopy nl 7/14   Wt Readings from Last 3 Encounters:  09/05/16 (!) 336 lb 12 oz (152.7 kg)  08/08/16 (!) 333 lb 8 oz (151.3 kg)  08/01/16 (!) 332 lb 12.8 oz (151 kg)   57.80 kg/m   Lab Results  Component Value Date   WBC 11.7 (H) 06/23/2015   HGB 13.7 06/23/2015   HCT 41.3 06/23/2015   MCV 93.9 06/23/2015   PLT 285 06/23/2015     Chemistry      Component Value Date/Time   NA 140 06/23/2015 1744   NA 142 06/05/2011 2203   K 4.2 06/23/2015 1744   K 3.8 06/05/2011 2203   CL 104 06/23/2015 1744   CL 106 06/05/2011 2203   CO2 23 06/23/2015 1744   CO2 21 06/05/2011 2203   BUN 12 06/23/2015 1744   BUN 13 06/05/2011 2203   CREATININE 1.25 (H) 06/23/2015 1744   CREATININE 1.13 06/05/2011 2203      Component Value Date/Time   CALCIUM 9.3 06/23/2015 1744   CALCIUM 9.0 06/05/2011 2203   ALKPHOS 55 04/27/2015 1619   ALKPHOS 51 06/05/2011 2203   AST 12 04/27/2015 1619   AST 22 06/05/2011 2203   ALT 10 04/27/2015 1619   ALT 17 06/05/2011 2203   BILITOT 0.3  04/27/2015 1619   BILITOT 0.2 06/05/2011 2203        Patient Active Problem List   Diagnosis Date Noted  . Diarrhea 09/05/2016  . Urinary incontinence 12/02/2015  . Cystocele 04/06/2015  . Constipation 04/06/2015  . Urine frequency 12/28/2014  . Back pain 12/28/2014  . Hypertriglyceridemia 10/14/2014  . Breast mass in female 07/28/2014  . Right otitis media 10/21/2013  . Right knee pain 07/23/2013  . Fall 07/23/2013  . Insect bite 06/05/2013  . Great toe pain 05/12/2013  . Medication management 11/20/2012  . Abdominal pain 04/23/2012  . Uric acid kidney stone 11/20/2011  . HYPERHIDROSIS 11/07/2009  . HOT FLASHES 06/17/2009  . Type 2 diabetes mellitus without complication, without long-term current use of insulin ([place]ackenzie Bonilla) 08/23/2008  . VITAMIN D DEFICIENCY 05/07/2008  . VITAMIN B12 DEFICIENCY 06/04/2007  . CHRONIC FATIGUE SYNDROME 11/15/2006  . DIVERTICULITIS, HX OF 11/15/2006  . PCOS (polycystic ovarian syndrome) 10/16/2006  . Hyperlipidemia LDL goal <100 10/16/2006  . Morbid obesity with BMI of 50.0-59.9, adult ([place]) 10/16/2006  . ANXIETY 10/16/2006  . PANIC DISORDER 10/16/2006  .  BULIMIA 10/16/2006  . Chronic depression 10/16/2006  . CARPAL TUNNEL SYNDROME 10/16/2006  . LYMPHEDEMA 10/16/2006  . Allergic rhinitis 10/16/2006  . Mild intermittent asthma 10/16/2006  . TMJ SYNDROME 10/16/2006  . GERD 10/16/2006  . IRRITABLE BOWEL SYNDROME 10/16/2006  . OSTEOARTHRITIS 10/16/2006  . Myalgia and myositis 10/16/2006  . COUGH, CHRONIC 10/16/2006   Past Medical History:  Diagnosis Date  . Allergy    allergic rhinitis  . Anemia   . Anxiety   . Arthritis    osteoarthritis  . Asthma   . Claustrophobia    does not like oxygen mask covering face  . Depression   . Diabetes mellitus without complication ([place])   . Fatty liver 07/2008   abd. ultrasound - fatty liver ; slt dilated cbd (no stones ) 06/10// abd.  ultrasound normal on 04/2006  . Fibromyalgia   . GERD  (gastroesophageal reflux disease)    EGD negative 06/2001// EGD erythematous mucosa, polyp 08/2008  . High uric acid in 24 hour urine specimen   . Hyperhidrosis   . Hyperlipidemia   . Kidney stones   . Lymphedema   . Neuromuscular disorder (HCC)    fibromyalgia  . Obesity   . Shortness of breath dyspnea   . Tachycardia   . TMJ (dislocation of temporomandibular joint)   . UTI (lower urinary tract infection)    Past Surgical History:  Procedure Laterality Date  . BREAST BIOPSY Right 2016   neg  . BREAST LUMPECTOMY WITH RADIOACTIVE SEED LOCALIZATION Right 08/02/2014   Procedure: BREAST LUMPECTOMY WITH RADIOACTIVE SEED LOCALIZATION;  Surgeon: Mackenzie Bookbinder, MD;  Location: [facility];  Service: General;  Laterality: Right;  . COLONOSCOPY    . ENDOMETRIAL BIOPSY  01/2004  . ESOPHAGOGASTRODUODENOSCOPY    . FOOT SURGERY    . SEPTOPLASTY     two times  . TONSILLECTOMY    . UTERINE FIBROID SURGERY  06/2005   ablation  . uterine tumor  09/2001  . VAGUS NERVE STIMULATOR INSERTION     Social History  Substance Use Topics  . Smoking status: Never Smoker  . Smokeless tobacco: Never Used  . Alcohol use No   Family History  Problem Relation Age of Onset  . Hypertension Father   . Cancer Father        pancreatic cancer  . Hypertension Sister   . Heart disease Maternal Grandmother 60       MI  . Diabetes Maternal Grandmother   . Heart disease Paternal Grandmother 55       MI  . Diabetes Paternal Grandmother   . Breast cancer Neg Hx    Allergies  Allergen Reactions  . Cephalexin   . Codeine     REACTION: ? reaction  . Duloxetine   . Hydrocodone     REACTION: ? reaction  . Levaquin [Levofloxacin] Nausea Only  . Metformin And Related Diarrhea    GI side effects   . Prednisone     Tolerates intraarticular depo-medrol.  . Quetiapine   . Sertraline Hcl     REACTION: vivid dreams  . Triazolam     REACTION: ? reaction  . Zaleplon   . Zocor [Simvastatin - High Dose]      achey  . Zolpidem Tartrate     hallucinations  . Norelgestromin-Eth Estradiol     Patch didn't stick to skin   Current Outpatient Prescriptions on File Prior to Visit  Medication Sig Dispense Refill  . ACCU-CHEK AVIVA PLUS test strip  Use to check blood sugar  once a day 100 each 0  . ACCU-CHEK SOFTCLIX LANCETS lancets Use to check blood sugar  once a day 100 each 0  . acetaminophen (TYLENOL) 500 MG tablet Take 500 mg by mouth every 6 (six) hours as needed.     Marland Kitchen albuterol (PROVENTIL HFA;VENTOLIN HFA) 108 (90 Base) MCG/ACT inhaler Inhale 2 puffs into the lungs every 4 (four) hours as needed for wheezing. 1 Inhaler 3  . ALPRAZolam (XANAX) 1 MG tablet Take 2 mg by mouth 3 (three) times daily as needed.     . Blood Glucose Calibration (ACCU-CHEK AVIVA) SOLN     . buPROPion (WELLBUTRIN XL) 300 MG 24 hr tablet     . cyclobenzaprine (FLEXERIL) 5 MG tablet TAKE ONE TABLET THREE TIMES A DAY IF NEEDED FOR MUSCLE SPASM 30 tablet 3  . FLUoxetine (PROZAC) 40 MG capsule Take 80 mg by mouth daily.     . furosemide (LASIX) 20 MG tablet Take 1 tablet (20 mg total) by mouth daily. Prn edema 30 tablet 3  . gabapentin (NEURONTIN) 300 MG capsule TAKE 1 CAPSULE BY MOUTH 3 TIMES DAILY 90 capsule 5  . ibuprofen (ADVIL,MOTRIN) 600 MG tablet Take 1 tablet (600 mg total) by mouth every 8 (eight) hours as needed. 30 tablet 0  . norethindrone-ethinyl estradiol (JUNEL FE,GILDESS FE,LOESTRIN FE) 1-20 MG-MCG tablet Take 1 tablet by mouth daily.    Marland Kitchen omeprazole (PRILOSEC) 20 MG capsule TAKE ONE CAPSULE TWICE A DAY 60 capsule 11  . oxybutynin (DITROPAN XL) 15 MG 24 hr tablet TAKE 1 TABLET BY MOUTH DAILY 90 tablet 0  . spironolactone (ALDACTONE) 100 MG tablet Take 100 mg by mouth daily.     Marland Kitchen talc (ZEASORB) powder Apply topically as needed. (Patient taking differently: Apply 1 application topically daily as needed (sweat). ) 240 g 3  . tamsulosin (FLOMAX) 0.4 MG CAPS capsule Take 1 capsule (0.4 mg total) by mouth daily. 30  capsule 0  . traZODone (DESYREL) 150 MG tablet Take 450 mg by mouth at bedtime.     . TRULICITY 1.5 PZ/0.2HE SOPN INJECT CONTENTS OF 1 PEN ONCE A WEEK 2 mL 3   No current facility-administered medications on file prior to visit.     Review of Systems Review of Systems  Constitutional: Negative for fever, appetite change,  and unexpected weight change.  Eyes: Negative for pain and visual disturbance.  Respiratory: Negative for cough and shortness of breath.   Cardiovascular: Negative for cp or palpitations    Gastrointestinal: Negative for nausea,  and constipation. Neg for blood in stool or dark stool , neg for vomiting Genitourinary: Negative for urgency and frequency.  Skin: Negative for pallor or rash   Neurological: Negative for weakness, light-headedness, numbness and headaches.  Hematological: Negative for adenopathy. Does not bruise/bleed easily.  Psychiatric/Behavioral: pos for chronic dep and anx in treatment          Objective:   Physical Exam  Constitutional: She appears well-developed and well-nourished. No distress.  Morbidly obese and well app  HENT:  Head: Normocephalic and atraumatic.  Mouth/Throat: Oropharynx is clear and moist.  Eyes: Pupils are equal, round, and reactive to light. Conjunctivae and EOM are normal. No scleral icterus.  Neck: Normal range of motion. Neck supple.  Cardiovascular: Normal rate, regular rhythm and normal heart sounds.   Pulmonary/Chest: Effort normal and breath sounds normal. No respiratory distress. She has no wheezes. She has no rales.  Abdominal: Soft. Bowel sounds  are normal. She exhibits no distension, no pulsatile liver, no abdominal bruit, no pulsatile midline mass and no mass. There is tenderness in the right lower quadrant, epigastric area, periumbilical area and left lower quadrant. There is no rigidity, no rebound, no guarding, no CVA tenderness, no tenderness at McBurney's point and negative Murphy's sign.  Lymphadenopathy:     She has no cervical adenopathy.  Neurological: She is alert.  Skin: Skin is warm and dry. No erythema. No pallor.  No jaundice   Brisk cap refill Nl color and turgor   Psychiatric: Her speech is normal. Her mood appears anxious. She exhibits a depressed mood.  Tearful at times           Assessment & Plan:   Problem List Items Addressed This Visit      Other   Abdominal pain - Primary    Low abd pain/bloating and cramping with diarrhea for 2-3 wk  Lab Stool cx and c diff Then plan from there immodium prn if needed and if it does not worsen abd bloat  Brat diet  Reassuring exam Rev last colonoscopy      Relevant Orders   CBC with Differential/Platelet (Completed)   Comprehensive metabolic panel (Completed)   Amylase (Completed)   Lipase (Completed)   Diarrhea    With abd pain and bloating Hx of ibs Rev colonoscopy last  Lab  Stool cx and c diff Disc hydration Plan from there       Relevant Orders   CBC with Differential/Platelet (Completed)   Comprehensive metabolic panel (Completed)   TSH (Completed)   C. difficile GDH and Toxin A/B   Stool culture   Morbid obesity with BMI of 50.0-59.9, adult ([place])    Discussed how this problem influences overall health and the risks it imposes  Reviewed plan for weight loss with lower calorie diet (via better food choices and also portion control or program like weight watchers) and exercise building up to or more than 30 minutes 5 days per week including some aerobic activity          Other Visit Diagnoses    Morbid obesity with body mass index of 60.0-69.9 in adult [facility])   (Chronic)

## 2016-09-05 NOTE — Telephone Encounter (Signed)
I will see her then  

## 2016-09-05 NOTE — Patient Instructions (Signed)
Lab today for stool culture and c diff stool test as well as serum labs for diarrhea and abdominal pain   immodium is ok to use occasionally if necessary   Take your temp if you have fever symptoms   Try hard to keep up with fluids so you do not get dehydrated

## 2016-09-05 NOTE — Telephone Encounter (Signed)
[place] Patient Name: Mackenzie Bonilla DOB: 02-17-1965 Initial Comment caller states the past 3 weeks she gets nauseas when she eats an stomach cramps and has bloating and diarrhea . She has an appointment on Monday Nurse Assessment Nurse: Markus Daft, RN, Sherre Poot Date/Time Eilene Ghazi Time): 09/05/2016 2:46:38 PM Confirm and document reason for call. If symptomatic, describe symptoms. ---Caller states the past 3 weeks she gets nauseas when she eats as well as lower to mid abdominal cramps. Rates the pain at 2-3/10 and then suddenly increase to 5/10. It goes away at times. Feels like she has to have a BM, but she can't. Last BM was yesterday AM and it was loose. c/o bloating and diarrhea/loose stools - none in last 24 hrs. She has followed a bland diet. -- She has an appointment on Monday set. Does the patient have any new or worsening symptoms? ---Yes Will a triage be completed? ---Yes Related visit to physician within the last 2 weeks? ---No Does the PT have any chronic conditions? (i.e. diabetes, asthma, etc.) ---Yes List chronic conditions. ---Fibromyalgia, Diabetic, IBS Is the patient pregnant or possibly pregnant? (Ask all females between the ages of 4-55) ---No Is this a behavioral health or substance abuse call? ---No Guidelines Guideline Title Affirmed Question Affirmed Notes Abdominal Pain - Female [1] MILD-MODERATE pain AND [2] constant AND [3] present > 2 hours Final Disposition User See Physician within 4 Hours (or PCP triage) Markus Daft, RN, Sherre Poot Comments Today appt made with Dr. Glori Bickers at 6 pm. (Office staff assisted in making appt). Referrals GO TO FACILITY UNDECIDED REFERRED TO PCP OFFICE Disagree/Comply: Comply

## 2016-09-06 ENCOUNTER — Telehealth: Payer: Self-pay

## 2016-09-06 LAB — CBC WITH DIFFERENTIAL/PLATELET
BASOS ABS: 0.1 10*3/uL (ref 0.0–0.1)
Basophils Relative: 1.1 % (ref 0.0–3.0)
EOS PCT: 1.9 % (ref 0.0–5.0)
Eosinophils Absolute: 0.2 10*3/uL (ref 0.0–0.7)
HEMATOCRIT: 42.2 % (ref 36.0–46.0)
HEMOGLOBIN: 14 g/dL (ref 12.0–15.0)
LYMPHS PCT: 28.9 % (ref 12.0–46.0)
Lymphs Abs: 2.4 10*3/uL (ref 0.7–4.0)
MCHC: 33.3 g/dL (ref 30.0–36.0)
MCV: 96 fl (ref 78.0–100.0)
MONOS PCT: 8.5 % (ref 3.0–12.0)
Monocytes Absolute: 0.7 10*3/uL (ref 0.1–1.0)
Neutro Abs: 4.9 10*3/uL (ref 1.4–7.7)
Neutrophils Relative %: 59.6 % (ref 43.0–77.0)
Platelets: 250 10*3/uL (ref 150.0–400.0)
RBC: 4.4 Mil/uL (ref 3.87–5.11)
RDW: 13.6 % (ref 11.5–15.5)
WBC: 8.2 10*3/uL (ref 4.0–10.5)

## 2016-09-06 LAB — COMPREHENSIVE METABOLIC PANEL
ALBUMIN: 3.9 g/dL (ref 3.5–5.2)
ALK PHOS: 42 U/L (ref 39–117)
ALT: 12 U/L (ref 0–35)
AST: 15 U/L (ref 0–37)
BILIRUBIN TOTAL: 0.3 mg/dL (ref 0.2–1.2)
BUN: 14 mg/dL (ref 6–23)
CO2: 27 mEq/L (ref 19–32)
Calcium: 9.9 mg/dL (ref 8.4–10.5)
Chloride: 104 mEq/L (ref 96–112)
Creatinine, Ser: 1.12 mg/dL (ref 0.40–1.20)
GFR: 54.48 mL/min — AB (ref >60.00)
GLUCOSE: 139 mg/dL — AB (ref 70–99)
Potassium: 4.2 mEq/L (ref 3.5–5.1)
Sodium: 140 mEq/L (ref 135–145)
TOTAL PROTEIN: 6.8 g/dL (ref 6.0–8.3)

## 2016-09-06 LAB — LIPASE: LIPASE: 22 U/L (ref 11.0–59.0)

## 2016-09-06 LAB — TSH: TSH: 2.63 u[IU]/mL (ref 0.35–4.50)

## 2016-09-06 LAB — AMYLASE: AMYLASE: 34 U/L (ref 27–131)

## 2016-09-06 NOTE — Telephone Encounter (Signed)
Aware-I doubt that has anything to do with her current symptoms / diarrhea/abd etc  I hope things go well

## 2016-09-06 NOTE — Assessment & Plan Note (Signed)
With abd pain and bloating Hx of ibs Rev colonoscopy last  Lab  Stool cx and c diff Disc hydration Plan from there

## 2016-09-06 NOTE — Assessment & Plan Note (Signed)
Low abd pain/bloating and cramping with diarrhea for 2-3 wk  Lab Stool cx and c diff Then plan from there immodium prn if needed and if it does not worsen abd bloat  Brat diet  Reassuring exam Rev last colonoscopy

## 2016-09-06 NOTE — Telephone Encounter (Signed)
PLEASE NOTE: All timestamps contained within this report are represented as [place] Standard Time. CONFIDENTIALTY NOTICE: This fax transmission is intended only for the addressee. It contains information that is legally privileged, confidential or otherwise protected from use or disclosure. If you are not the intended recipient, you are strictly prohibited from reviewing, disclosing, copying using or disseminating any of this information or taking any action in reliance on or regarding this information. If you have received this fax in error, please notify us immediately by telephone so that we can arrange for its return to [place]. Phone: [phone], Toll-Free: [phone], Fax: [phone] Page: 1 of 1 Call Id: [id number] [place] Night - Client Nonclinical Telephone Record [place] Night - Client Client Site [place] Physician Glori Bickers, Roque Lias - MD Contact Type Call Who Is Calling Patient / Member / Family / Caregiver Caller Name Declined to provide Caller Phone Number [phone] Patient Name Mackenzie Bonilla Call Type Message Only Information Provided Reason for Call Request to Speak to a Physician Initial Comment Caller States she is calling to let Dr. Glori Bickers know that she has been going to his dentist's office 5 times to have pieces of a tooth removed. Call Closed By: Sharlet Salina Transaction Date/Time: 09/05/2016 7:58:59 PM (ET)

## 2016-09-06 NOTE — Telephone Encounter (Signed)
Pt came by office and wanted to make sure Dr Glori Bickers got the note that tooth keeps coming back and has seen dentist x 5. Pt has appt to see oral surgeon today. Pt request cb if Dr Glori Bickers has any comment or suggestions.

## 2016-09-06 NOTE — Telephone Encounter (Signed)
Left voicemail letting pt know Dr. Marliss Coots comments

## 2016-09-06 NOTE — Assessment & Plan Note (Signed)
Discussed how this problem influences overall health and the risks it imposes  Reviewed plan for weight loss with lower calorie diet (via better food choices and also portion control or program like weight watchers) and exercise building up to or more than 30 minutes 5 days per week including some aerobic activity    

## 2016-09-07 LAB — C. DIFFICILE GDH AND TOXIN A/B
C. DIFF TOXIN A/B: NOT DETECTED
C. DIFFICILE GDH: NOT DETECTED

## 2016-09-10 ENCOUNTER — Ambulatory Visit: Payer: Medicare Other | Admitting: Family Medicine

## 2016-09-10 ENCOUNTER — Telehealth: Payer: Self-pay | Admitting: Family Medicine

## 2016-09-10 LAB — STOOL CULTURE

## 2016-09-10 NOTE — Telephone Encounter (Signed)
Patient called to get her stool culture results.  I let patient know her results.  Patient said she tried to eat regular foods for the past 2 days and she has had constipation. Patient has a bad stomachache and low back pain. Her stomach is bloated and hard. Patient said she hasn't had diarrhea since Friday.  Patient would like some recommendations for foods she can eat.  The pharmacist gave her the [place], but it causes her sugar to increase.

## 2016-09-10 NOTE — Telephone Encounter (Signed)
I'm glad the diarrhea is better.  It is ok to start eating some fruits and vegetables (the ones she likes and tolerates)  The BRAt is low fiber- so she can start to increase fiber  Metamucil or citrucel are also helpful for IBS (can improve both constipation and diarrhea) - unsure if she has tried this before   If no improvement we can refer to GI so let me know how it goes

## 2016-09-11 NOTE — Telephone Encounter (Signed)
Per DPR: left voicemail letting pt know Dr. Marliss Coots comments and recommendations and to let us know if she wants to proceed with GI referral in the future if sxs don't resolve

## 2016-09-18 ENCOUNTER — Institutional Professional Consult (permissible substitution): Payer: Medicare Other | Admitting: Family Medicine

## 2016-10-17 ENCOUNTER — Other Ambulatory Visit: Payer: Self-pay | Admitting: Family Medicine

## 2016-10-17 NOTE — Telephone Encounter (Signed)
Not on med list, last OV was an acute appt and no recent or future f/u or CPE, please advise

## 2016-10-17 NOTE — Telephone Encounter (Signed)
Please call and see what symptoms are  Thanks

## 2016-10-18 NOTE — Telephone Encounter (Signed)
Please refill tessalon times one  F/u if symptoms worsen or do not improve

## 2016-10-18 NOTE — Telephone Encounter (Signed)
Rx sent and pt notified of Dr. Marliss Coots comments

## 2016-10-18 NOTE — Telephone Encounter (Signed)
Pt said for the last 3 weeks she has had a very bad cough. She said in the morning it's very productive and she is coughing up cloudy white to yellow phlegm. During the day it can range from a dry cough to a productive cough, and the cough is the worse at night when she is trying to sleep. Pt got OTC cough syrup but it was making her drossy so she only takes it at night, and the tessalon during the day. Pt had an old Rx for the tessalon she has been taking that has been helping a little but she ran out of it that's why she requested [place] to refill it. Pt said she doesn't have any other sxs, no fever, no chills, no body aches (besides normal leg pain). Pt request refill of med, please advise

## 2016-10-22 ENCOUNTER — Ambulatory Visit: Payer: Medicare Other | Admitting: Podiatry

## 2016-10-29 ENCOUNTER — Ambulatory Visit (INDEPENDENT_AMBULATORY_CARE_PROVIDER_SITE_OTHER): Payer: Medicare Other

## 2016-10-29 ENCOUNTER — Ambulatory Visit (INDEPENDENT_AMBULATORY_CARE_PROVIDER_SITE_OTHER): Payer: Medicare Other | Admitting: Podiatry

## 2016-10-29 DIAGNOSIS — E1142 Type 2 diabetes mellitus with diabetic polyneuropathy: Secondary | ICD-10-CM

## 2016-10-29 DIAGNOSIS — B351 Tinea unguium: Secondary | ICD-10-CM | POA: Diagnosis not present

## 2016-10-29 DIAGNOSIS — M79676 Pain in unspecified toe(s): Secondary | ICD-10-CM

## 2016-10-29 DIAGNOSIS — D3613 Benign neoplasm of peripheral nerves and autonomic nervous system of lower limb, including hip: Secondary | ICD-10-CM | POA: Diagnosis not present

## 2016-10-29 NOTE — Progress Notes (Signed)
This patient presents the office for continued evaluation of her long thick nails.  The nails are painful walking and wearing her shoes.    She presents for preventive foot care services..  She says she started to develop sharp shooting pain in her right foot which was caused by driving. She also has pain between 3,4 toes right foot,  GENERAL APPEARANCE: Alert, conversant. Appropriately groomed. No acute distress.  VASCULAR: Pedal pulses are  palpable at  [facility] and PT bilateral.  Capillary refill time is immediate to all digits,  Normal temperature gradient.   NEUROLOGIC: sensation is diminished  to 5.07 monofilament at 5/5 sites bilateral.  Light touch is intact bilateral, Muscle strength normal.  MUSCULOSKELETAL: acceptable muscle strength, tone and stability bilateral.  Intrinsic muscluature intact bilateral.  Rectus appearance of foot and digits noted bilateral. IPJ dorsiflexed at IPJ .  Palpable pain second metatarsal right foot. Palpable pain third interspace right foot.    DERMATOLOGIC: skin color, texture, and turgor are within normal limits.  No preulcerative lesions or ulcers  are seen, no interdigital maceration noted.  No open lesions present.  . No drainage noted. NAILS  thick disfigured discolored nails on both feet. She also has the distal aspect of the left hallux growing into the tip of her toe. No evidence of any redness, swelling or infection  Diagnosis  Onychomycosis  Diabetes with neuropathy. Neuroma right foot.  ROV  Debridement of nails. Told her to take advil for pain over her second metatarsal.  RTC 10 weeks. Injection therapy right foot third interspace.   Gardiner Barefoot DPM

## 2016-11-07 LAB — HM DIABETES EYE EXAM

## 2016-11-12 ENCOUNTER — Encounter: Payer: Self-pay | Admitting: Family Medicine

## 2016-11-12 ENCOUNTER — Ambulatory Visit (INDEPENDENT_AMBULATORY_CARE_PROVIDER_SITE_OTHER): Payer: Medicare Other | Admitting: Family Medicine

## 2016-11-12 VITALS — BP 128/68 | HR 96 | Temp 98.8°F | Wt 339.5 lb

## 2016-11-12 DIAGNOSIS — Z7409 Other reduced mobility: Secondary | ICD-10-CM | POA: Insufficient documentation

## 2016-11-12 DIAGNOSIS — Z23 Encounter for immunization: Secondary | ICD-10-CM

## 2016-11-12 DIAGNOSIS — J3089 Other allergic rhinitis: Secondary | ICD-10-CM

## 2016-11-12 DIAGNOSIS — Z6841 Body Mass Index (BMI) 40.0 and over, adult: Secondary | ICD-10-CM | POA: Diagnosis not present

## 2016-11-12 DIAGNOSIS — F32A Depression, unspecified: Secondary | ICD-10-CM

## 2016-11-12 DIAGNOSIS — R05 Cough: Secondary | ICD-10-CM

## 2016-11-12 DIAGNOSIS — F329 Major depressive disorder, single episode, unspecified: Secondary | ICD-10-CM

## 2016-11-12 DIAGNOSIS — R059 Cough, unspecified: Secondary | ICD-10-CM

## 2016-11-12 DIAGNOSIS — F411 Generalized anxiety disorder: Secondary | ICD-10-CM

## 2016-11-12 NOTE — Assessment & Plan Note (Signed)
Fairly stable  Continues psychiatric f/u and current medicines

## 2016-11-12 NOTE — Assessment & Plan Note (Signed)
Exercise is challenging  Still enc to work on diet best she can

## 2016-11-12 NOTE — Progress Notes (Signed)
Subjective:    Patient ID: Mackenzie Bonilla, female    DOB: [birth year], 51 y.o.   MRN: [id number]  HPI Here to discuss getting a handicap sticker for parking (mobility impaired)   Only 10 spaces at her appt  Has a handicapped ramp - need to open another one / needs it to use her grocery cart    Hx of morbid obesity  Also OA and previous fractures as well as chronic back pain and myalgias and fibromyalgia  Also neuroma in foot  Has had a fall before-does not want to   Also anxiety  Hx of anxiety disorder and panic  and depression treated by psychiatry  Still very afraid of falling   Takes trazodone prozac Xanax   Still coughing a lot  Gunk in throat- mucous  She wakes up with it / thick  Has not taken mucinex in a while  Some tussin DM  Tessalon  Cough drops occ  Both night and day  Some exp to dust - and her air cond vent is over the couch - blows on her   Takes omeprazole and that helps gerd-not a problem   Tried a wedge pillow-too uncomfortable    Wt Readings from Last 3 Encounters:  11/12/16 (!) 339 lb 8 oz (154 kg)  09/05/16 (!) 336 lb 12 oz (152.7 kg)  08/08/16 (!) 333 lb 8 oz (151.3 kg)   58.28 kg/m   Patient Active Problem List   Diagnosis Date Noted  . Mobility impaired 11/12/2016  . Diarrhea 09/05/2016  . Urinary incontinence 12/02/2015  . Cystocele 04/06/2015  . Constipation 04/06/2015  . Urine frequency 12/28/2014  . Back pain 12/28/2014  . Hypertriglyceridemia 10/14/2014  . Breast mass in female 07/28/2014  . Right otitis media 10/21/2013  . Right knee pain 07/23/2013  . Fall 07/23/2013  . Insect bite 06/05/2013  . Great toe pain 05/12/2013  . Medication management 11/20/2012  . Abdominal pain 04/23/2012  . Uric acid kidney stone 11/20/2011  . HYPERHIDROSIS 11/07/2009  . HOT FLASHES 06/17/2009  . Type 2 diabetes mellitus without complication, without long-term current use of insulin ([place]) 08/23/2008  . VITAMIN D DEFICIENCY  05/07/2008  . VITAMIN B12 DEFICIENCY 06/04/2007  . CHRONIC FATIGUE SYNDROME 11/15/2006  . DIVERTICULITIS, HX OF 11/15/2006  . PCOS (polycystic ovarian syndrome) 10/16/2006  . Hyperlipidemia LDL goal <100 10/16/2006  . Morbid obesity with BMI of 50.0-59.9, adult ([place]) 10/16/2006  . Generalized anxiety disorder 10/16/2006  . PANIC DISORDER 10/16/2006  . BULIMIA 10/16/2006  . Chronic depression 10/16/2006  . CARPAL TUNNEL SYNDROME 10/16/2006  . LYMPHEDEMA 10/16/2006  . Allergic rhinitis 10/16/2006  . Mild intermittent asthma 10/16/2006  . TMJ SYNDROME 10/16/2006  . GERD 10/16/2006  . IRRITABLE BOWEL SYNDROME 10/16/2006  . OSTEOARTHRITIS 10/16/2006  . Myalgia and myositis 10/16/2006  . COUGH, CHRONIC 10/16/2006   Past Medical History:  Diagnosis Date  . Allergy    allergic rhinitis  . Anemia   . Anxiety   . Arthritis    osteoarthritis  . Asthma   . Claustrophobia    does not like oxygen mask covering face  . Depression   . Diabetes mellitus without complication ([place])   . Fatty liver 07/2008   abd. ultrasound - fatty liver ; slt dilated cbd (no stones ) 06/10// abd.  ultrasound normal on 04/2006  . Fibromyalgia   . GERD (gastroesophageal reflux disease)    EGD negative 06/2001// EGD erythematous mucosa, polyp 08/2008  . High  uric acid in 24 hour urine specimen   . Hyperhidrosis   . Hyperlipidemia   . Kidney stones   . Lymphedema   . Neuromuscular disorder ([facility])    fibromyalgia  . Obesity   . Shortness of breath dyspnea   . Tachycardia   . TMJ (dislocation of temporomandibular joint)   . UTI (lower urinary tract infection)    Past Surgical History:  Procedure Laterality Date  . BREAST BIOPSY Right 2016   neg  . BREAST LUMPECTOMY WITH RADIOACTIVE SEED LOCALIZATION Right 08/02/2014   Procedure: BREAST LUMPECTOMY WITH RADIOACTIVE SEED LOCALIZATION;  Surgeon: Rolm Bookbinder, MD;  Location: [facility];  Service: General;  Laterality: Right;  . COLONOSCOPY    .  ENDOMETRIAL BIOPSY  01/2004  . ESOPHAGOGASTRODUODENOSCOPY    . FOOT SURGERY    . SEPTOPLASTY     two times  . TONSILLECTOMY    . UTERINE FIBROID SURGERY  06/2005   ablation  . uterine tumor  09/2001  . VAGUS NERVE STIMULATOR INSERTION     Social History  Substance Use Topics  . Smoking status: Never Smoker  . Smokeless tobacco: Never Used  . Alcohol use No   Family History  Problem Relation Age of Onset  . Hypertension Father   . Cancer Father        pancreatic cancer  . Hypertension Sister   . Heart disease Maternal Grandmother 60       MI  . Diabetes Maternal Grandmother   . Heart disease Paternal Grandmother 64       MI  . Diabetes Paternal Grandmother   . Breast cancer Neg Hx    Allergies  Allergen Reactions  . Cephalexin   . Codeine     REACTION: ? reaction  . Duloxetine   . Levaquin [Levofloxacin] Nausea Only  . Metformin And Related Diarrhea    GI side effects   . Prednisone     Tolerates intraarticular depo-medrol.  . Quetiapine Other (See Comments)  . Sertraline Hcl     REACTION: vivid dreams  . Simvastatin Other (See Comments)    achey  . Triazolam     REACTION: ? reaction  . Zaleplon   . Zocor [Simvastatin - High Dose]     achey  . Zolpidem Tartrate     hallucinations  . Hydrocodone Rash    REACTION: ? reaction REACTION: ? reaction  . Norelgestromin-Eth Estradiol     Patch didn't stick to skin   Current Outpatient Prescriptions on File Prior to Visit  Medication Sig Dispense Refill  . ACCU-CHEK AVIVA PLUS test strip Use to check blood sugar  once a day 100 each 0  . ACCU-CHEK SOFTCLIX LANCETS lancets Use to check blood sugar  once a day 100 each 0  . acetaminophen (TYLENOL) 500 MG tablet Take 500 mg by mouth every 6 (six) hours as needed.     Marland Kitchen albuterol (PROVENTIL HFA;VENTOLIN HFA) 108 (90 Base) MCG/ACT inhaler Inhale 2 puffs into the lungs every 4 (four) hours as needed for wheezing. 1 Inhaler 3  . ALPRAZolam (XANAX) 1 MG tablet Take 2 mg  by mouth 3 (three) times daily as needed.     . benzonatate (TESSALON) 200 MG capsule TAKE 1 CAPSULES BY MOUTH 3 TIMES DAILY AS NEEDED FOR COUGH 90 capsule 1  . Blood Glucose Calibration (ACCU-CHEK AVIVA) SOLN     . buPROPion (WELLBUTRIN XL) 300 MG 24 hr tablet     . Cholecalciferol (VITAMIN D3) 10000 units  TABS Take 1 capsule by mouth once a week.    . cyclobenzaprine (FLEXERIL) 5 MG tablet TAKE ONE TABLET THREE TIMES A DAY IF NEEDED FOR MUSCLE SPASM 30 tablet 3  . FLUoxetine (PROZAC) 40 MG capsule Take 80 mg by mouth daily.     . furosemide (LASIX) 20 MG tablet Take 1 tablet (20 mg total) by mouth daily. Prn edema 30 tablet 3  . gabapentin (NEURONTIN) 300 MG capsule TAKE 1 CAPSULE BY MOUTH 3 TIMES DAILY 90 capsule 5  . ibuprofen (ADVIL,MOTRIN) 600 MG tablet Take 1 tablet (600 mg total) by mouth every 8 (eight) hours as needed. 30 tablet 0  . norethindrone-ethinyl estradiol (JUNEL FE,GILDESS FE,LOESTRIN FE) 1-20 MG-MCG tablet Take 1 tablet by mouth daily.    Marland Kitchen omeprazole (PRILOSEC) 20 MG capsule TAKE ONE CAPSULE TWICE A DAY 60 capsule 11  . oxybutynin (DITROPAN XL) 15 MG 24 hr tablet TAKE 1 TABLET BY MOUTH DAILY 90 tablet 0  . spironolactone (ALDACTONE) 100 MG tablet Take 100 mg by mouth daily.     Marland Kitchen talc (ZEASORB) powder Apply topically as needed. (Patient taking differently: Apply 1 application topically daily as needed (sweat). ) 240 g 3  . tamsulosin (FLOMAX) 0.4 MG CAPS capsule Take 1 capsule (0.4 mg total) by mouth daily. 30 capsule 0  . traZODone (DESYREL) 150 MG tablet Take 450 mg by mouth at bedtime.     . TRULICITY 1.5 ZO/1.0RU SOPN INJECT CONTENTS OF 1 PEN ONCE A WEEK 2 mL 3  . vitamin B-12 (CYANOCOBALAMIN) 1000 MCG tablet Take 1,000 mcg by mouth daily.     No current facility-administered medications on file prior to visit.     Review of Systems  Constitutional: Positive for fatigue. Negative for activity change, appetite change, fever and unexpected weight change.  HENT:  Positive for postnasal drip and rhinorrhea. Negative for congestion, ear discharge, ear pain, sinus pressure and sore throat.   Eyes: Negative for pain, redness and visual disturbance.  Respiratory: Positive for cough. Negative for choking, shortness of breath, wheezing and stridor.   Cardiovascular: Negative for chest pain and palpitations.  Gastrointestinal: Negative for abdominal pain, blood in stool, constipation and diarrhea.  Endocrine: Negative for polydipsia and polyuria.  Genitourinary: Negative for dysuria, frequency and urgency.  Musculoskeletal: Positive for arthralgias, gait problem and myalgias. Negative for back pain.  Skin: Negative for pallor and rash.  Allergic/Immunologic: Negative for environmental allergies.  Neurological: Negative for dizziness, syncope and headaches.  Hematological: Negative for adenopathy. Does not bruise/bleed easily.  Psychiatric/Behavioral: Positive for dysphoric mood. Negative for decreased concentration. The patient is nervous/anxious.        Objective:   Physical Exam  Constitutional: She appears well-developed and well-nourished. No distress.  Morbidly obese and well appearing   HENT:  Head: Normocephalic and atraumatic.  Right Ear: External ear normal.  Left Ear: External ear normal.  Mouth/Throat: Oropharynx is clear and moist.  Nares are injected and congested  Clear post nasal drip   Occ clears throat  Eyes: Pupils are equal, round, and reactive to light. Conjunctivae and EOM are normal.  Neck: Normal range of motion. Neck supple. No JVD present. Carotid bruit is not present. No thyromegaly present.  Cardiovascular: Normal rate, regular rhythm, normal heart sounds and intact distal pulses.  Exam reveals no gallop.   Pulmonary/Chest: Effort normal and breath sounds normal. No respiratory distress. She has no wheezes. She has no rales. She exhibits no tenderness.  No crackles occ dry cough  Good air exch   Abdominal: Soft. Bowel  sounds are normal. She exhibits no distension, no abdominal bruit and no mass. There is no tenderness.  Musculoskeletal: She exhibits no edema.  Gait is slow and labored due to joint pain and obesity   Lymphadenopathy:    She has no cervical adenopathy.  Neurological: She is alert. She has normal reflexes.  Skin: Skin is warm and dry. No rash noted.  Psychiatric: Her speech is normal and behavior is normal. Her mood appears anxious. Her affect is not blunt, not labile and not inappropriate. She exhibits a depressed mood.  Baseline affect  Answers questions appropriately           Assessment & Plan:   Problem List Items Addressed This Visit      Respiratory   Allergic rhinitis    Pt thinks her chronic cough is due to allergies/pnd Will see which antihistamine works best- Sports coach or claritin  Also add flonase ns daily  Update if not helpful          Other   Chronic depression   COUGH, CHRONIC    Ongoing - perhaps worse at night Pt does not think it is gerd related  Suspect more rel to allergies  Will continue antihistamine  Also add flonase ns  For thick mucous or congestion can add mucinex Tessalon prn  Update if not imp  Per pt-has seen ENT in the past w/o help  Also suggested sleeping more propped up if possible       Generalized anxiety disorder    Fairly stable  Continues psychiatric f/u and current medicines        Mobility impaired    Morbid obesity and OA of multiple joints  Handicapped sticker form done  Also letter to her apt complex requesting a ramp        Morbid obesity with BMI of 50.0-59.9, adult ([place]) - Primary    Exercise is challenging  Still enc to work on diet best she can        Other Visit Diagnoses    Need for influenza vaccination       Relevant Orders   Flu Vaccine QUAD 6+ mos PF IM (Fluarix Quad PF) (Completed)

## 2016-11-12 NOTE — Assessment & Plan Note (Signed)
Pt thinks her chronic cough is due to allergies/pnd Will see which antihistamine works best- Sports coach or claritin  Also add flonase ns daily  Update if not helpful

## 2016-11-12 NOTE — Assessment & Plan Note (Signed)
Morbid obesity and OA of multiple joints  Handicapped sticker form done  Also letter to her apt complex requesting a ramp

## 2016-11-12 NOTE — Patient Instructions (Addendum)
The 3 non sedating antihistamines I like are zyrtec (cetrizine), allegra or clartiin  See which one works best  [employer] can take any of these at night  A steroid nasal spray is also helpful - 2 sprays in each nostril once daily (also at night)   Drink lots of fluids If mucous remains very thick- use mucinex as well   If you can put a brick under head of the bed to tilt it up -this may help a bit also   Take care of yourself

## 2016-11-12 NOTE — Assessment & Plan Note (Signed)
Ongoing - perhaps worse at night Pt does not think it is gerd related  Suspect more rel to allergies  Will continue antihistamine  Also add flonase ns  For thick mucous or congestion can add mucinex Tessalon prn  Update if not imp  Per pt-has seen ENT in the past w/o help  Also suggested sleeping more propped up if possible

## 2016-12-19 ENCOUNTER — Ambulatory Visit: Payer: Self-pay | Admitting: *Deleted

## 2016-12-19 NOTE — Telephone Encounter (Signed)
Pt sched with [employer]

## 2016-12-19 NOTE — Telephone Encounter (Signed)
   Reason for Disposition . Cough has been present for > 3 weeks  Answer Assessment - Initial Assessment Questions 1. ONSET: "When did the cough begin?"      3 months - patient was seen 1 month ago  2. SEVERITY: "How bad is the cough today?"      Comes and goes- has spells-"fits" 3. RESPIRATORY DISTRESS: "Describe your breathing."      The congestion and coughing makes is difficult at times -but other than that patient not in distress 4. FEVER: "Do you have a fever?" If so, ask: "What is your temperature, how was it measured, and when did it start?"     Cold and hot- has not taken temperture 5. HEMOPTYSIS: "Are you coughing up any blood?" If so ask: "How much?" (flecks, streaks, tablespoons, etc.)     no 6. TREATMENT: "What have you done so far to treat the cough?" (e.g., meds, fluids, humidifier)     Cough drop, musinex,allergy medication,cough syrup DM 7. CARDIAC HISTORY: "Do you have any history of heart disease?" (e.g., heart attack, congestive heart failure)      no 8. LUNG HISTORY: "Do you have any history of lung disease?"  (e.g., pulmonary embolus, asthma, emphysema)     Slight asthma- patient has an inhaler- has used the inhaler a few times a week- it burns her throat 9. PE RISK FACTORS: "Do you have a history of blood clots?" (or: recent major surgery, recent prolonged travel, bedridden )     no 10. OTHER SYMPTOMS: "Do you have any other symptoms? (e.g., runny nose, wheezing, chest pain)       Runny nose, headache, teeth hurt, face is pain on left side 11. PREGNANCY: "Is there any chance you are pregnant?" "When was your last menstrual period?"       N/A- OCP 12. TRAVEL: "Have you traveled out of the country in the last month?" (e.g., travel history, exposures)       N/A  Protocols used: COUGH - ACUTE NON-PRODUCTIVE-A-AH

## 2016-12-21 ENCOUNTER — Telehealth: Payer: Self-pay | Admitting: Family Medicine

## 2016-12-21 ENCOUNTER — Ambulatory Visit (INDEPENDENT_AMBULATORY_CARE_PROVIDER_SITE_OTHER)
Admission: RE | Admit: 2016-12-21 | Discharge: 2016-12-21 | Disposition: A | Discharge status: Home / Self Care | Payer: Medicare Other | Source: Ambulatory Visit | Attending: Family Medicine | Admitting: Family Medicine

## 2016-12-21 ENCOUNTER — Encounter: Payer: Self-pay | Admitting: Family Medicine

## 2016-12-21 ENCOUNTER — Ambulatory Visit: Payer: Medicare Other | Admitting: Family Medicine

## 2016-12-21 VITALS — BP 124/88 | HR 94 | Temp 97.8°F | Wt 321.5 lb

## 2016-12-21 DIAGNOSIS — R05 Cough: Secondary | ICD-10-CM

## 2016-12-21 DIAGNOSIS — R059 Cough, unspecified: Secondary | ICD-10-CM

## 2016-12-21 DIAGNOSIS — J309 Allergic rhinitis, unspecified: Secondary | ICD-10-CM

## 2016-12-21 LAB — CBC
HEMATOCRIT: 46.4 % — AB (ref 36.0–46.0)
Hemoglobin: 15.1 g/dL — ABNORMAL HIGH (ref 12.0–15.0)
MCHC: 32.5 g/dL (ref 30.0–36.0)
MCV: 97.3 fl (ref 78.0–100.0)
PLATELETS: 255 10*3/uL (ref 150.0–400.0)
RBC: 4.77 Mil/uL (ref 3.87–5.11)
RDW: 13.6 % (ref 11.5–15.5)
WBC: 7.3 10*3/uL (ref 4.0–10.5)

## 2016-12-21 NOTE — Patient Instructions (Addendum)
It was a pleasure to meet you today  Please get some Afrin nasal spray (generic is fine) and use twice a day for 4 days. Use before you use your flonase.  Then use flonase every night before bedtime  Try over the counter Delsym for cough  Continue antihistamine  Use your albuterol inhaler every 4-6 hours for next two days

## 2016-12-21 NOTE — Telephone Encounter (Signed)
Requesting  The  Results  Of  Her chest    X  Ray  Results     Have  Not  Been  Released  At this  Time   Pt   Is  Following  Her  Plan of  Care      She  Was   Seen  At the  Office  Today  Had  Blood  Work  And  cxr     Done  And  She  Is  Awaiting  A  Call back  For  Results    Pt  Advised  To that   She  Would  Be  Notified  Of results  When  Available     Attempted  To contact  [place]  But  Unable    To get in contact

## 2016-12-21 NOTE — Progress Notes (Signed)
Subjective:    Patient ID: Mackenzie Bonilla, female    DOB: 06/25/1965, 51 y.o.   MRN: [id number]  HPI This is a 51 yo female who presents today with cough x 3 months. Was seen by Dr. Glori Bickers for cough last month. She reports a lot of post nasal drainage with thick mucus. She has known allergies, thinks related to her apartment. She has been taking mucinex and antihistamine no significant improvement. Has not been using flonase because of the congestion. Feels some pressure in her face. Unsure what color her nasal drainage is. Uses her inhaler without relief. Feels occasionally SOB, no wheezing.  Stomach has been upset and she has had some intermittent diarrhea. She thinks this is from the drainage.   Past Medical History:  Diagnosis Date  . Allergy    allergic rhinitis  . Anemia   . Anxiety   . Arthritis    osteoarthritis  . Asthma   . Claustrophobia    does not like oxygen mask covering face  . Depression   . Diabetes mellitus without complication ([place])   . Fatty liver 07/2008   abd. ultrasound - fatty liver ; slt dilated cbd (no stones ) 06/10// abd.  ultrasound normal on 04/2006  . Fibromyalgia   . GERD (gastroesophageal reflux disease)    EGD negative 06/2001// EGD erythematous mucosa, polyp 08/2008  . High uric acid in 24 hour urine specimen   . Hyperhidrosis   . Hyperlipidemia   . Kidney stones   . Lymphedema   . Neuromuscular disorder (HCC)    fibromyalgia  . Obesity   . Shortness of breath dyspnea   . Tachycardia   . TMJ (dislocation of temporomandibular joint)   . UTI (lower urinary tract infection)    Past Surgical History:  Procedure Laterality Date  . BREAST BIOPSY Right 2016   neg  . COLONOSCOPY    . ENDOMETRIAL BIOPSY  01/2004  . ESOPHAGOGASTRODUODENOSCOPY    . FOOT SURGERY    . SEPTOPLASTY     two times  . TONSILLECTOMY    . UTERINE FIBROID SURGERY  06/2005   ablation  . uterine tumor  09/2001  . VAGUS NERVE STIMULATOR INSERTION     Family  History  Problem Relation Age of Onset  . Hypertension Father   . Cancer Father        pancreatic cancer  . Hypertension Sister   . Heart disease Maternal Grandmother 60       MI  . Diabetes Maternal Grandmother   . Heart disease Paternal Grandmother 80       MI  . Diabetes Paternal Grandmother   . Breast cancer Neg Hx    Social History   Tobacco Use  . Smoking status: Never Smoker  . Smokeless tobacco: Never Used  Substance Use Topics  . Alcohol use: No    Alcohol/week: 0.0 oz  . Drug use: No      Review of Systems Per HPI    Objective:   Physical Exam  Constitutional: She is oriented to person, place, and time. She appears well-developed and well-nourished. No distress.  HENT:  Head: Normocephalic and atraumatic.  Right Ear: Tympanic membrane, external ear and ear canal normal.  Left Ear: Tympanic membrane, external ear and ear canal normal.  Nose: Mucosal edema and rhinorrhea present.  Mouth/Throat: Uvula is midline. Posterior oropharyngeal erythema present. No oropharyngeal exudate or posterior oropharyngeal edema.  Cardiovascular: Normal rate, regular rhythm and normal heart sounds.  Pulmonary/Chest: Effort normal. She has wheezes (few, scattered, expiratory left mid).  Neurological: She is alert and oriented to person, place, and time.  Skin: Skin is warm and dry. She is not diaphoretic.  Psychiatric: She has a normal mood and affect. Her behavior is normal. Judgment and thought content normal.  Vitals reviewed.     BP 124/88 (BP Location: Right Arm, Patient Position: Sitting, Cuff Size: Normal)   Pulse 94   Temp 97.8 F (36.6 C) (Oral)   Wt (!) 321 lb 8 oz (145.8 kg)   SpO2 94%   BMI 55.19 kg/m  Wt Readings from Last 3 Encounters:  12/21/16 (!) 321 lb 8 oz (145.8 kg)  11/12/16 (!) 339 lb 8 oz (154 kg)  09/05/16 (!) 336 lb 12 oz (152.7 kg)       Assessment & Plan:  1. Cough - given persistence, will check CXR and CBC, she is unable to tolerate  prednisone, so I have asked her to use her albuterol inhaler every 4-6 hours for the next 2 days then prn - CBC - DG Chest 2 View; Future  2. Allergic rhinitis, unspecified seasonality, unspecified trigger -  Patient Instructions  It was a pleasure to meet you today  Please get some Afrin nasal spray (generic is fine) and use twice a day for 4 days. Use before you use your flonase.  Then use flonase every night before bedtime  Try over the counter Delsym for cough  Continue antihistamine  Use your albuterol inhaler every 4-6 hours for next two days    Mackenzie Reamer, FNP-BC  [facility]  12/22/2016 7:49 AM

## 2016-12-22 ENCOUNTER — Encounter: Payer: Self-pay | Admitting: Family Medicine

## 2016-12-25 ENCOUNTER — Other Ambulatory Visit: Payer: Self-pay | Admitting: Family Medicine

## 2017-01-07 ENCOUNTER — Other Ambulatory Visit: Payer: Self-pay | Admitting: Unknown Physician Specialty

## 2017-01-07 DIAGNOSIS — R1310 Dysphagia, unspecified: Secondary | ICD-10-CM

## 2017-01-10 ENCOUNTER — Encounter: Payer: Self-pay | Admitting: Podiatry

## 2017-01-10 ENCOUNTER — Ambulatory Visit: Payer: Medicare Other | Admitting: Podiatry

## 2017-01-10 DIAGNOSIS — D3613 Benign neoplasm of peripheral nerves and autonomic nervous system of lower limb, including hip: Secondary | ICD-10-CM

## 2017-01-10 DIAGNOSIS — E1142 Type 2 diabetes mellitus with diabetic polyneuropathy: Secondary | ICD-10-CM

## 2017-01-10 DIAGNOSIS — B351 Tinea unguium: Secondary | ICD-10-CM | POA: Diagnosis not present

## 2017-01-10 DIAGNOSIS — M79676 Pain in unspecified toe(s): Secondary | ICD-10-CM | POA: Diagnosis not present

## 2017-01-10 NOTE — Progress Notes (Signed)
This patient presents the office for continued evaluation of her long thick nails.  The nails are painful walking and wearing her shoes.    She presents for preventive foot care services..  She says her right foot has improved since the injection last visit.  She says that she has the same problem now that has developed in the ball of her left foot.  She requests treatment and evaluation of this problem.  GENERAL APPEARANCE: Alert, conversant. Appropriately groomed. No acute distress.  VASCULAR: Pedal pulses are  palpable at  [facility] and PT bilateral.  Capillary refill time is immediate to all digits,  Normal temperature gradient.   NEUROLOGIC: sensation is diminished  to 5.07 monofilament at 5/5 sites bilateral.  Light touch is intact bilateral, Muscle strength normal.  MUSCULOSKELETAL: acceptable muscle strength, tone and stability bilateral.  Intrinsic muscluature intact bilateral.  Rectus appearance of foot and digits noted bilateral. IPJ dorsiflexed at IPJ .   Palpable pain third interspace left  foot.    DERMATOLOGIC: skin color, texture, and turgor are within normal limits.  No preulcerative lesions or ulcers  are seen, no interdigital maceration noted.  No open lesions present.  . No drainage noted. NAILS  thick disfigured discolored nails on both feet. She also has the distal aspect of the left hallux growing into the tip of her toe. No evidence of any redness, swelling or infection  Diagnosis  Onychomycosis  Diabetes with neuropathy. Neuroma left  foot.  ROV  Debridement of nails.   RTC 10 weeks. Injection therapy left  foot third interspace.Injection therapy using 1.0 cc. Of 2% xylocaine( 20 mg.) plus 1 cc. of kenalog-la ( 10 mg) plus 1/2 cc. of dexamethazone phosphate ( 2 mg)   Gardiner Barefoot DPM

## 2017-01-11 ENCOUNTER — Ambulatory Visit: Payer: Medicare Other

## 2017-01-16 ENCOUNTER — Ambulatory Visit: Payer: Medicare Other

## 2017-01-17 ENCOUNTER — Ambulatory Visit: Payer: Medicare Other

## 2017-02-14 ENCOUNTER — Ambulatory Visit: Payer: Self-pay | Admitting: *Deleted

## 2017-02-14 NOTE — Telephone Encounter (Signed)
Pt reports fatigue, weakness since "before Thanksgiving."  Feels achy, No appetite, mild nausea, no vomiting, no diarrhea. Denies dizziness, states mild lower to mid back pain. No pain with urination, states urine clear, odorless. Noted anxiety, mild SOB( which resolved quickly), during triage call; pt states she has not been taking her medications "because she wakes up late."  Pt refuses to see anyone other than Dr. Glori Bickers. Home Care advice given.  Appt made by agent with Dr. Glori Bickers 02/20/17.  Instructed pt to call back if symptoms worsen; any increased anxiety, and to take her medications as ordered. Also advised to call 911 for any severe SOB related to anxiety.

## 2017-02-14 NOTE — Telephone Encounter (Signed)
   Reason for Disposition . [1] Fatigue (i.e., tires easily, decreased energy) AND [2] persists > 1 week  Answer Assessment - Initial Assessment Questions 1. DESCRIPTION: "Describe how you are feeling."     Very achy, fatigued, weakness,no appetite, nausea, mid-lower back pain. 2. SEVERITY: "How bad is it?"  "Can you stand and walk?"   - MILD - Feels weak or tired, but does not interfere with work, school or normal activities   - [place] to stand and walk; weakness interferes with work, school, or normal activities   - SEVERE - Unable to stand or walk     Moderate 3. ONSET:  "When did the weakness begin?"     01/02/2017 4. CAUSE: "What do you think is causing the weakness?"     unsure 5. MEDICINES: "Have you recently started a new medicine or had a change in the amount of a medicine?"     unsure 6. OTHER SYMPTOMS: "Do you have any other symptoms?" (e.g., chest pain, fever, cough, SOB, vomiting, diarrhea, bleeding)     SOB, anxiety, depression 7. PREGNANCY: "Is there any chance you are pregnant?" "When was your last menstrual period?"     no  Protocols used: WEAKNESS (GENERALIZED) AND FATIGUE-A-AH

## 2017-02-16 ENCOUNTER — Encounter: Payer: Self-pay | Admitting: Emergency Medicine

## 2017-02-16 ENCOUNTER — Other Ambulatory Visit: Payer: Self-pay

## 2017-02-16 ENCOUNTER — Emergency Department
Admission: EM | Admit: 2017-02-16 | Discharge: 2017-02-16 | Disposition: A | Discharge status: Home / Self Care | Payer: Medicare Other | Attending: Student in an Organized Health Care Education/Training Program | Admitting: Student in an Organized Health Care Education/Training Program

## 2017-02-16 DIAGNOSIS — Z79899 Other long term (current) drug therapy: Secondary | ICD-10-CM | POA: Diagnosis not present

## 2017-02-16 DIAGNOSIS — N3000 Acute cystitis without hematuria: Secondary | ICD-10-CM | POA: Insufficient documentation

## 2017-02-16 DIAGNOSIS — J45909 Unspecified asthma, uncomplicated: Secondary | ICD-10-CM | POA: Diagnosis not present

## 2017-02-16 DIAGNOSIS — B372 Candidiasis of skin and nail: Secondary | ICD-10-CM

## 2017-02-16 DIAGNOSIS — E119 Type 2 diabetes mellitus without complications: Secondary | ICD-10-CM | POA: Diagnosis not present

## 2017-02-16 DIAGNOSIS — R21 Rash and other nonspecific skin eruption: Secondary | ICD-10-CM | POA: Diagnosis present

## 2017-02-16 LAB — URINALYSIS, COMPLETE (UACMP) WITH MICROSCOPIC
Bilirubin Urine: NEGATIVE
Glucose, UA: NEGATIVE mg/dL
KETONES UR: 5 mg/dL — AB
NITRITE: NEGATIVE
PROTEIN: 100 mg/dL — AB
Specific Gravity, Urine: 1.024 (ref 1.005–1.030)
pH: 5 (ref 5.0–8.0)

## 2017-02-16 MED ORDER — MICONAZOLE NITRATE 2 % EX CREA
TOPICAL_CREAM | Freq: Once | CUTANEOUS | Status: AC
  Administered 2017-02-16: 20:00:00 via TOPICAL
  Filled 2017-02-16: qty 14

## 2017-02-16 MED ORDER — SULFAMETHOXAZOLE-TRIMETHOPRIM 800-160 MG PO TABS
1.0000 | ORAL_TABLET | Freq: Once | ORAL | Status: AC
  Administered 2017-02-16: 1 via ORAL
  Filled 2017-02-16: qty 1

## 2017-02-16 MED ORDER — MICONAZOLE NITRATE 2 % EX CREA: TOPICAL_CREAM | CUTANEOUS | 1 refills | Status: DC

## 2017-02-16 MED ORDER — FLUCONAZOLE 50 MG PO TABS
150.0000 mg | ORAL_TABLET | Freq: Once | ORAL | Status: AC
  Administered 2017-02-16: 150 mg via ORAL
  Filled 2017-02-16: qty 1

## 2017-02-16 MED ORDER — FLUCONAZOLE 150 MG PO TABS: 150.0000 mg | ORAL_TABLET | ORAL | 0 refills | Status: AC

## 2017-02-16 NOTE — ED Provider Notes (Signed)
[facility] Emergency Department Provider Note ____________________________________________  Time seen: 1642  I have reviewed the triage vital signs and the nursing notes.  HISTORY  Chief Complaint  Rash  HPI Mackenzie Bonilla is a 52 y.o. female presents to the ED for evaluation of an itchy, weeping, red rash to her panniculus.  Patient who is morbidly obese with a history of diabetes mellitus, presents for she seems to be a yeast infection to the skin folds of her pannus.  She notes difficulty with keeping the area clean and dry as she has been noted in the past.  She is previously had similar skin infections to the skin under her breasts.  She also reports some low-grade fevers, malaise, and some dysuria.  She called intended to be seen by her primary care provider, but was not able to secure an appointment until the middle of next week.  Past Medical History:  Diagnosis Date  . Allergy    allergic rhinitis  . Anemia   . Anxiety   . Arthritis    osteoarthritis  . Asthma   . Claustrophobia    does not like oxygen mask covering face  . Depression   . Diabetes mellitus without complication ([place])   . Fatty liver 07/2008   abd. ultrasound - fatty liver ; slt dilated cbd (no stones ) 06/10// abd.  ultrasound normal on 04/2006  . Fibromyalgia   . GERD (gastroesophageal reflux disease)    EGD negative 06/2001// EGD erythematous mucosa, polyp 08/2008  . High uric acid in 24 hour urine specimen   . Hyperhidrosis   . Hyperlipidemia   . Kidney stones   . Lymphedema   . Neuromuscular disorder (HCC)    fibromyalgia  . Obesity   . Shortness of breath dyspnea   . Tachycardia   . TMJ (dislocation of temporomandibular joint)   . UTI (lower urinary tract infection)     Patient Active Problem List   Diagnosis Date Noted  . Mobility impaired 11/12/2016  . Diarrhea 09/05/2016  . Urinary incontinence 12/02/2015  . Cystocele 04/06/2015  . Constipation  04/06/2015  . Urine frequency 12/28/2014  . Back pain 12/28/2014  . Hypertriglyceridemia 10/14/2014  . Breast mass in female 07/28/2014  . Right otitis media 10/21/2013  . Right knee pain 07/23/2013  . Fall 07/23/2013  . Insect bite 06/05/2013  . Great toe pain 05/12/2013  . Medication management 11/20/2012  . Abdominal pain 04/23/2012  . Uric acid kidney stone 11/20/2011  . HYPERHIDROSIS 11/07/2009  . HOT FLASHES 06/17/2009  . Type 2 diabetes mellitus without complication, without long-term current use of insulin ([place]) 08/23/2008  . VITAMIN D DEFICIENCY 05/07/2008  . VITAMIN B12 DEFICIENCY 06/04/2007  . CHRONIC FATIGUE SYNDROME 11/15/2006  . DIVERTICULITIS, HX OF 11/15/2006  . PCOS (polycystic ovarian syndrome) 10/16/2006  . Hyperlipidemia LDL goal <100 10/16/2006  . Morbid obesity with BMI of 50.0-59.9, adult ([place]) 10/16/2006  . Generalized anxiety disorder 10/16/2006  . PANIC DISORDER 10/16/2006  . BULIMIA 10/16/2006  . Chronic depression 10/16/2006  . CARPAL TUNNEL SYNDROME 10/16/2006  . LYMPHEDEMA 10/16/2006  . Allergic rhinitis 10/16/2006  . Mild intermittent asthma 10/16/2006  . TMJ SYNDROME 10/16/2006  . GERD 10/16/2006  . IRRITABLE BOWEL SYNDROME 10/16/2006  . OSTEOARTHRITIS 10/16/2006  . Myalgia and myositis 10/16/2006  . COUGH, CHRONIC 10/16/2006    Past Surgical History:  Procedure Laterality Date  . BREAST BIOPSY Right 2016   neg  . BREAST LUMPECTOMY WITH RADIOACTIVE SEED  LOCALIZATION Right 08/02/2014   Procedure: BREAST LUMPECTOMY WITH RADIOACTIVE SEED LOCALIZATION;  Surgeon: Rolm Bookbinder, MD;  Location: [facility];  Service: General;  Laterality: Right;  . COLONOSCOPY    . ENDOMETRIAL BIOPSY  01/2004  . ESOPHAGOGASTRODUODENOSCOPY    . FOOT SURGERY    . SEPTOPLASTY     two times  . TONSILLECTOMY    . UTERINE FIBROID SURGERY  06/2005   ablation  . uterine tumor  09/2001  . VAGUS NERVE STIMULATOR INSERTION      Prior to Admission medications    Medication Sig Start Date End Date Taking? Authorizing Provider  ACCU-CHEK AVIVA PLUS test strip Use to check blood sugar  once a day 05/30/15   Elayne Snare, MD  ACCU-CHEK SOFTCLIX LANCETS lancets Use to check blood sugar  once a day 05/30/15   Elayne Snare, MD  acetaminophen (TYLENOL) 500 MG tablet Take 500 mg by mouth every 6 (six) hours as needed.     [provider]  albuterol (PROVENTIL HFA;VENTOLIN HFA) 108 (90 Base) MCG/ACT inhaler Inhale 2 puffs into the lungs every 4 (four) hours as needed for wheezing. 08/08/16   Copland, Frederico Hamman, MD  ALPRAZolam Duanne Moron) 1 MG tablet Take 2 mg by mouth 3 (three) times daily as needed.     [provider]  benzonatate (TESSALON) 200 MG capsule TAKE 1 CAPSULES BY MOUTH 3 TIMES DAILY AS NEEDED FOR COUGH 10/18/16   Tower, Wynelle Fanny, MD  Blood Glucose Calibration (ACCU-CHEK AVIVA) SOLN  10/01/14   [provider]  buPROPion (WELLBUTRIN XL) 300 MG 24 hr tablet  03/04/15   [provider]  Cetirizine HCl 10 MG CAPS Take 1 capsule by mouth daily.    [provider]  Cholecalciferol (VITAMIN D3) 10000 units TABS Take 1 capsule by mouth once a week.    [provider]  cyclobenzaprine (FLEXERIL) 5 MG tablet TAKE ONE TABLET THREE TIMES A DAY IF NEEDED FOR MUSCLE SPASM 03/07/16   Tower, Wynelle Fanny, MD  Dextromethorphan-Guaifenesin (TUSSIN DM PO) Take by mouth daily as needed.    [provider]  fluconazole (DIFLUCAN) 150 MG tablet Take 1 tablet (150 mg total) by mouth once a week for 4 doses. 02/16/17 03/10/17  Ygnacio Fecteau, Dannielle Karvonen, PA-C  FLUoxetine (PROZAC) 40 MG capsule Take 80 mg by mouth daily.     [provider]  furosemide (LASIX) 20 MG tablet Take 1 tablet (20 mg total) by mouth daily. Prn edema 10/14/14   Copland, Frederico Hamman, MD  gabapentin (NEURONTIN) 300 MG capsule TAKE 1 CAPSULE BY MOUTH 3 TIMES DAILY 08/17/16   Tower, Wynelle Fanny, MD  ibuprofen (ADVIL,MOTRIN) 600 MG tablet Take 1 tablet (600 mg total) by  mouth every 8 (eight) hours as needed. 01/14/15   Landis Martins, DPM  lisinopril (PRINIVIL,ZESTRIL) 5 MG tablet Take 5 tablets by mouth daily. 09/14/16 09/14/17  [provider]  miconazole (MICATIN) 2 % cream Apply to affected area 2 times daily 02/16/17 03/19/17  Hezakiah Champeau, Dannielle Karvonen, PA-C  norethindrone-ethinyl estradiol (JUNEL FE,GILDESS FE,LOESTRIN FE) 1-20 MG-MCG tablet Take 1 tablet by mouth daily.    [provider]  omeprazole (PRILOSEC) 20 MG capsule TAKE 1 CAPSULE BY MOUTH TWO TIMES DAILY 12/25/16   Tower, Wynelle Fanny, MD  oxybutynin (DITROPAN XL) 15 MG 24 hr tablet TAKE 1 TABLET BY MOUTH DAILY 12/25/16   Tower, Annabella A, MD  PRAVASTATIN SODIUM PO Take 1 tablet by mouth daily.    [provider]  spironolactone (ALDACTONE) 100 MG tablet Take 100 mg by mouth daily.  06/01/14   [provider]  talc (ZEASORB) powder Apply topically as needed. Patient taking differently: Apply 1 application topically daily as needed (sweat).  09/08/13   Tower, Wynelle Fanny, MD  tamsulosin (FLOMAX) 0.4 MG CAPS capsule Take 1 capsule (0.4 mg total) by mouth daily. 06/23/15   Maryan Puls, MD  traZODone (DESYREL) 150 MG tablet Take 450 mg by mouth at bedtime.  05/20/14   [provider]  TRULICITY 1.5 HE/5.2DP SOPN INJECT CONTENTS OF 1 PEN ONCE A WEEK 05/02/15   Elayne Snare, MD  vitamin B-12 (CYANOCOBALAMIN) 1000 MCG tablet Take 1,000 mcg by mouth daily.    [provider]    Allergies Cephalexin; Codeine; Duloxetine; Levaquin [levofloxacin]; Metformin and related; Prednisone; Quetiapine; Sertraline hcl; Simvastatin; Triazolam; Zaleplon; Zocor [simvastatin - high dose]; Zolpidem tartrate; Hydrocodone; and Norelgestromin-eth estradiol  Family History  Problem Relation Age of Onset  . Hypertension Father   . Cancer Father        pancreatic cancer  . Hypertension Sister   . Heart disease Maternal Grandmother 60       MI  . Diabetes Maternal Grandmother   . Heart  disease Paternal Grandmother 86       MI  . Diabetes Paternal Grandmother   . Breast cancer Neg Hx     Social History Social History   Tobacco Use  . Smoking status: Never Smoker  . Smokeless tobacco: Never Used  Substance Use Topics  . Alcohol use: No    Alcohol/week: 0.0 oz  . Drug use: No    Review of Systems  Constitutional: Positive for low-grade fever. Eyes: Negative for visual changes. ENT: Negative for sore throat. Cardiovascular: Negative for chest pain. Respiratory: Negative for shortness of breath. Gastrointestinal: Negative for abdominal pain, vomiting and diarrhea. Genitourinary: Positive for dysuria. Musculoskeletal: Negative for back pain. Skin: Positive for rash. Neurological: Negative for headaches, focal weakness or numbness. ____________________________________________  PHYSICAL EXAM:  VITAL SIGNS: ED Triage Vitals  Enc Vitals Group     BP 02/16/17 1456 113/61     Pulse Rate 02/16/17 1456 (!) 114     Resp 02/16/17 1456 20     Temp 02/16/17 1456 97.9 F (36.6 C)     Temp Source 02/16/17 1456 Oral     SpO2 02/16/17 1456 96 %     Weight 02/16/17 1457 (!) 340 lb (154.2 kg)     Height 02/16/17 1457 5\' 4"  (1.626 m)     Head Circumference --      Peak Flow --      Pain Score 02/16/17 1455 9     Pain Loc --      Pain Edu? --      Excl. in [place]? --     Constitutional: Alert and oriented. Well appearing and in no distress. Head: Normocephalic and atraumatic. Cardiovascular: Normal rate, regular rhythm. Normal distal pulses. Respiratory: Normal respiratory effort. No wheezes/rales/rhonchi. Gastrointestinal: Soft and nontender. No distention. Protuberant with a large pannus. Some lymphedema noted to the left lower abdomen.  Musculoskeletal: Nontender with normal range of motion in all extremities.  Neurologic:  Normal gait without ataxia. Normal speech and language. No gross focal neurologic deficits are appreciated. Skin:  Skin is warm, dry and  intact.  Patient noted to have a well-demarcated, erythematous papular rash to the skin folds of her lower pannus.  There is moisture and weeping noted in the area.  No  skin breakdown, cellulitis, blister formation, or desquamation is appreciated. Psychiatric: Mood and affect are normal. Patient exhibits appropriate insight and judgment. ____________________________________________   LABS (pertinent positives/negatives)  Labs Reviewed  URINALYSIS, COMPLETE (UACMP) WITH MICROSCOPIC - Abnormal; Notable for the following components:      Result Value   Color, Urine YELLOW (*)    APPearance CLOUDY (*)    Hgb urine dipstick LARGE (*)    Ketones, ur 5 (*)    Protein, ur 100 (*)    Leukocytes, UA MODERATE (*)    Bacteria, UA MANY (*)    Squamous Epithelial / LPF 6-30 (*)    All other components within normal limits  URINE CULTURE  ____________________________________________  PROCEDURES  Procedures Bactrim DS 1 PO Diflucan 150 mg PO Miconazole 2% cream applied after: - intertriginous skin is washed with soap & water. - skin is thoroughly dried - covered with Telfa pads ____________________________________________  INITIAL IMPRESSION / ASSESSMENT AND PLAN / ED COURSE  Patient with ED evaluation of skin rash and dysuria.  Her exam is consistent with an intertriginous candidiasis infection.  Her lab confirms a urinary tract infection.  The majority of her time in the ED is spent discussing strategies for managing her skin infection including keeping the area clean and drying the area either using a cool hair dryer or clean pillowcases.  Patient is discharged with some wound care instructions and prescription for miconazole, Diflucan, and Bactrim.  She is also referred to her primary care provider for daily wound care and dressing changes.  Her body habitus makes it difficult for her to manage this wound and my concern is that it will progress to a superficial staph infection if not managed  appropriately.  Patient verbalizes understanding and is thankful for the care she received in the ED today.  ----------------------------------------- 11:42 PM on 02/16/2017 -----------------------------------------  Upon completion of the chart realized the patient did not receive her Bactrim prescription upon discharge. I will leave verbal instructions for the charge RN to call the patient to determine what pharmacy she uses. The prescription will be called in, in the morning.  ____________________________________________  FINAL CLINICAL IMPRESSION(S) / ED DIAGNOSES  Final diagnoses:  Intertriginous candidiasis  Acute cystitis without hematuria      Graysin Luczynski, Dannielle Karvonen, PA-C 02/16/17 2344    Merlyn Lot, MD 02/17/17 1624

## 2017-02-16 NOTE — Discharge Instructions (Signed)
You are being treated for skin yeast infection. Keep the area clean and dry. Consider using a hair dryer on a cool setting, after bathing. Pat the area dry and apply the cream as directed. Apply non-stick dressings to prevent skin from touching. Take the antifungal pill, weekly as directed. Consider using OTC dandruff shampoo as a bodywash.

## 2017-02-16 NOTE — ED Triage Notes (Signed)
Pt reports that she has a rash in her abd folds. She states that all the urgent care are closed and could not get into see her PMD.

## 2017-02-17 ENCOUNTER — Telehealth: Payer: Self-pay | Admitting: Emergency Medicine

## 2017-02-17 NOTE — Telephone Encounter (Signed)
-----   Message from [place], [place] sent at 02/16/2017 11:45 PM EST ----- I forgot to print her Bactrim prescription. Will you please call the patient to verify her pharmacy of choice? Then call in her prescription.  RX: Bactrim DS #14 Sig: 1 PO BID x 7 days   Thanks,  [employer]

## 2017-02-17 NOTE — Telephone Encounter (Signed)
Called and left message at [facility] , (pt preference) [phone] , Cannon Kettle PA wants pt to receive RX of Bactrim DS 1 tab PO 2 BID x 7days, QTY 14.

## 2017-02-18 ENCOUNTER — Encounter: Payer: Self-pay | Admitting: Family Medicine

## 2017-02-18 ENCOUNTER — Ambulatory Visit: Payer: Medicare Other | Admitting: Family Medicine

## 2017-02-18 VITALS — BP 124/80 | HR 96 | Temp 98.1°F | Wt 317.8 lb

## 2017-02-18 DIAGNOSIS — B372 Candidiasis of skin and nail: Secondary | ICD-10-CM

## 2017-02-18 LAB — URINE CULTURE: Special Requests: NORMAL

## 2017-02-18 MED ORDER — MICONAZOLE NITRATE 2 % EX CREA: 1.0000 "application " | TOPICAL_CREAM | Freq: Two times a day (BID) | CUTANEOUS | 0 refills | Status: DC

## 2017-02-18 NOTE — Progress Notes (Signed)
Subjective:    Patient ID: Mackenzie Bonilla, female    DOB: Mar 22, 1965, 52 y.o.   MRN: [id number]  HPI This is a 52 yo female who presents today for dressing change of intertriginous candida of pannus. She was seen in ER two days ago with severe pain and some bleeding of left underside of pannus. She was given a diflucan and prescriptions for miconazole. Telfa pads were placed but patient reports difficulty keeping them in place. She lives alone and her mother came to help her manage cream application and replace Telfa pads. It is difficult for her to manage cleaning and caring for the area due to her size. Her mother is not able to assist much. The pain is improved today and she has not seen any more blood.   She was also diagnosed with UTI and was unable to pick up her antibiotic until today. She is feeling generally miserable.    Past Medical History:  Diagnosis Date  . Allergy    allergic rhinitis  . Anemia   . Anxiety   . Arthritis    osteoarthritis  . Asthma   . Claustrophobia    does not like oxygen mask covering face  . Depression   . Diabetes mellitus without complication ([place])   . Fatty liver 07/2008   abd. ultrasound - fatty liver ; slt dilated cbd (no stones ) 06/10// abd.  ultrasound normal on 04/2006  . Fibromyalgia   . GERD (gastroesophageal reflux disease)    EGD negative 06/2001// EGD erythematous mucosa, polyp 08/2008  . High uric acid in 24 hour urine specimen   . Hyperhidrosis   . Hyperlipidemia   . Kidney stones   . Lymphedema   . Neuromuscular disorder (HCC)    fibromyalgia  . Obesity   . Shortness of breath dyspnea   . Tachycardia   . TMJ (dislocation of temporomandibular joint)   . UTI (lower urinary tract infection)    Past Surgical History:  Procedure Laterality Date  . BREAST BIOPSY Right 2016   neg  . BREAST LUMPECTOMY WITH RADIOACTIVE SEED LOCALIZATION Right 08/02/2014   Procedure: BREAST LUMPECTOMY WITH RADIOACTIVE SEED LOCALIZATION;   Surgeon: Rolm Bookbinder, MD;  Location: [place];  Service: General;  Laterality: Right;  . COLONOSCOPY    . ENDOMETRIAL BIOPSY  01/2004  . ESOPHAGOGASTRODUODENOSCOPY    . FOOT SURGERY    . SEPTOPLASTY     two times  . TONSILLECTOMY    . UTERINE FIBROID SURGERY  06/2005   ablation  . uterine tumor  09/2001  . VAGUS NERVE STIMULATOR INSERTION     Family History  Problem Relation Age of Onset  . Hypertension Father   . Cancer Father        pancreatic cancer  . Hypertension Sister   . Heart disease Maternal Grandmother 60       MI  . Diabetes Maternal Grandmother   . Heart disease Paternal Grandmother 93       MI  . Diabetes Paternal Grandmother   . Breast cancer Neg Hx    Social History   Socioeconomic History  . Marital status: Single    Spouse name: Not on file  . Number of children: Not on file  . Years of education: Not on file  . Highest education level: Not on file  Social Needs  . Financial resource strain: Not on file  . Food insecurity - worry: Not on file  . Food insecurity - inability: Not  on file  . Transportation needs - medical: Not on file  . Transportation needs - non-medical: Not on file  Occupational History  . Not on file  Tobacco Use  . Smoking status: Never Smoker  . Smokeless tobacco: Never Used  Substance and Sexual Activity  . Alcohol use: No    Alcohol/week: 0.0 oz  . Drug use: No  . Sexual activity: Not on file  Other Topics Concern  . Not on file  Social History Narrative  . Not on file      Review of Systems Per HPI    Objective:   Physical Exam  Constitutional: She is oriented to person, place, and time. She appears well-developed and well-nourished. No distress.  Morbidly obese.   HENT:  Head: Normocephalic and atraumatic.  Eyes: Conjunctivae are normal.  Cardiovascular: Normal rate.  Pulmonary/Chest: Effort normal.  Neurological: She is alert and oriented to person, place, and time.  Skin: Skin is warm and dry. She  is not diaphoretic.  Pannus lifted and Telfa pads removed, they had bunched up under skin. Very large area of hyperpigmentation/ moderate erythema. A few small areas excoriation. Area dry. Malodorous. Thin layer of patient's supply of miconazole cream applied and abdominal pads were placed between skin folds.   Psychiatric: She has a normal mood and affect. Her behavior is normal. Judgment and thought content normal.  Vitals reviewed.     .BP 124/80 (BP Location: Right Arm, Patient Position: Sitting, Cuff Size: Large)   Pulse 96   Temp 98.1 F (36.7 C) (Oral)   Wt (!) 317 lb 12 oz (144.1 kg)   SpO2 94%   BMI 54.54 kg/m  Wt Readings from Last 3 Encounters:  02/18/17 (!) 317 lb 12 oz (144.1 kg)  02/16/17 (!) 340 lb (154.2 kg)  12/21/16 (!) 321 lb 8 oz (145.8 kg)   Media Information   Document Information   Photos    02/18/2017 12:22  Attached To:  Office Visit on 02/18/17 with Elby Beck, [place], [place], [place]:  Discussed with Dr. Glori Bickers who agreed with plan of care 1. Candidiasis, intertriginous - patient with less pain today, no bleeding, area seems improved according to initial description from ER. - Will see if her prescription plan will cover larger container of miconazole.  - miconazole (MICOTIN) 2 % cream; Apply 1 application topically 2 (two) times daily.  Dispense: 198 g; Refill: 0 - was assisted by RN today who will do dressing change/medication application tomorrow. Patient has a follow up appointment with Dr. Glori Bickers in two days - discussed ways patient can achieve cleansing and drying of area- may need home health  Clarene Reamer, FNP-BC  [facility], [place]  02/19/2017 8:23 AM

## 2017-02-18 NOTE — Patient Instructions (Signed)
It was a pleasure to see you today  Please schedule a nurse visit with [facility] for tomorrow for dressing change, have your shower before you come, so if the dressings fall out, it is ok. Please schedule a follow up visit for Wed. With me or Dr. Glori Bickers.   Bring your cream with you.   I have provided a new printed prescription for a larger container of cream, you can see how much it will cost through your insurance and without insurance and if it is less expensive.

## 2017-02-19 ENCOUNTER — Ambulatory Visit: Payer: Medicare Other

## 2017-02-19 ENCOUNTER — Encounter: Payer: Self-pay | Admitting: Family Medicine

## 2017-02-19 DIAGNOSIS — B372 Candidiasis of skin and nail: Secondary | ICD-10-CM

## 2017-02-19 NOTE — Progress Notes (Signed)
Patient seen today as follow up from office visit yesterday.    Patient requires wound care treatment of yeast infection area under left panis area extending down in to groin.    Area is continuing to show great improvement with no evidence of open, draining areas and significant decrease in color of redness.   Patient has been following treatment protocol and is allowing area to remain free of constricting elastic and keeping pads in place with bid miconazole applications.  Area is clean and dry with very minimal odor today.  We discussed at length healthy options for diabetic diet and to promote urinary health as she is also suffering from a UTI.  She was highly discouraged from drinking orange juice and will replace with water and/or ZERO calorie gatorade.    Mackenzie Bonilla, CMA, assists me with wound care.  Area was cleansed with sterile NS and patted dry with gauze pad.  An appropriate layer of miconazole cream was applied (patient's supply she brought with her) and 2 ABD pads were placed in fold of her panis to promote better healing and absorb moisture.    Patient is to see Dr. Glori Bickers tomorrow for further follow up and care.  This Probation officer gave verbal report to Dr. Glori Bickers and suggested consideration to move to Nystatin powder in the near future for long term tx and prevention.  Dr. Glori Bickers aware and will prescribe in future as appropriate.   Patient tolerated procedure well and was very appreciative of our supports today.  In addition, we are trying to help her find incontinence briefs that will comfortably fit her and provide a better security of ABD pads while she is healing.  We will follow up with her tomorrow with any updates when she comes in to see PCP.

## 2017-02-20 ENCOUNTER — Ambulatory Visit: Payer: Self-pay

## 2017-02-20 ENCOUNTER — Encounter: Payer: Self-pay | Admitting: Family Medicine

## 2017-02-20 ENCOUNTER — Ambulatory Visit: Payer: Medicare Other | Admitting: Family Medicine

## 2017-02-20 VITALS — BP 116/72 | HR 80 | Temp 97.7°F | Wt 340.5 lb

## 2017-02-20 DIAGNOSIS — B372 Candidiasis of skin and nail: Secondary | ICD-10-CM

## 2017-02-20 DIAGNOSIS — H9201 Otalgia, right ear: Secondary | ICD-10-CM | POA: Diagnosis not present

## 2017-02-20 DIAGNOSIS — Z6841 Body Mass Index (BMI) 40.0 and over, adult: Secondary | ICD-10-CM

## 2017-02-20 DIAGNOSIS — E119 Type 2 diabetes mellitus without complications: Secondary | ICD-10-CM | POA: Diagnosis not present

## 2017-02-20 MED ORDER — TRAMADOL HCL 50 MG PO TABS: 50.0000 mg | ORAL_TABLET | Freq: Two times a day (BID) | ORAL | 0 refills | Status: DC | PRN

## 2017-02-20 MED ORDER — NYSTATIN 100000 UNIT/GM EX POWD: Freq: Two times a day (BID) | CUTANEOUS | 3 refills | Status: DC

## 2017-02-20 NOTE — Assessment & Plan Note (Signed)
Acute on chronic  Rev notes from ED as well as last 2 enc here with Garnette Gunner and Jacqualyn Posey  Reassuring exam of area under L pannus  -fading pink color with less scalloped edge and no skin breakdown No oozing or evidence of it on pad  Area cleaned with sterile water and well dried  Abdominal pads applied over miconazole cream  She wants to attempt to change drsg herself tomorrow (challenge with morbid obesity)  Will have re visit with [facility]. [facility] for skin check/drsg change on Friday  If stable to imp-can go ahead and transition to nystatin powder bid and obs  She feels she could do this at home  Disc opt for tx of morbid obesity as well

## 2017-02-20 NOTE — Telephone Encounter (Signed)
That is fine- especially now that she was not having drainage today Thanks

## 2017-02-20 NOTE — Assessment & Plan Note (Signed)
Pt continues to see endocrinologist  On trulicity

## 2017-02-20 NOTE — Progress Notes (Signed)
Subjective:    Patient ID: Mackenzie Bonilla, female    DOB: [birth year], 52 y.o.   MRN: [id number]  HPI Here for re check of wound   Also GI issues and R ear pain   Sharp pains in R ear on and off  Has had infection in the past   Some soreness in R flank  May have pulled a muscle    Saw Mandy for wound check yesterday- much better! Used abdominal pads into folds of pannus - may work better than smaller gauze for stability  Intertrigo/candida of pannus which she has a hard time reaching and cleaning  Using miconazole   Oozed a bit if any  Itching more last night  She cannot really see it    May need nystatin powder once healed for prevention   Wt Readings from Last 3 Encounters:  02/20/17 (!) 340 lb 8 oz (154.4 kg)  02/18/17 (!) 317 lb 12 oz (144.1 kg)  02/16/17 (!) 340 lb (154.2 kg)   Last wt was erroneous-different scale  58.45 kg/m  Morbid obesity limits self care /difficult for her   Mackenzie Bonilla was going to look into options for larger incontinence undergarments for her as well  Has incontinence from morbid obesity as well  That is irritating along with mal fitting clothes    Recent urine cx 1/5 was inconclusive with multiple species   Sees endocrinology for her DM On trulicity  Patient Active Problem List   Diagnosis Date Noted  . Right ear pain 02/20/2017  . Mobility impaired 11/12/2016  . Diarrhea 09/05/2016  . Urinary incontinence 12/02/2015  . Candidal intertrigo 04/06/2015  . Cystocele 04/06/2015  . Constipation 04/06/2015  . Urine frequency 12/28/2014  . Back pain 12/28/2014  . Hypertriglyceridemia 10/14/2014  . Breast mass in female 07/28/2014  . Right knee pain 07/23/2013  . Fall 07/23/2013  . Great toe pain 05/12/2013  . Medication management 11/20/2012  . Abdominal pain 04/23/2012  . Uric acid kidney stone 11/20/2011  . HYPERHIDROSIS 11/07/2009  . HOT FLASHES 06/17/2009  . Type 2 diabetes mellitus without complication,  without long-term current use of insulin ([place]) 08/23/2008  . VITAMIN D DEFICIENCY 05/07/2008  . VITAMIN B12 DEFICIENCY 06/04/2007  . CHRONIC FATIGUE SYNDROME 11/15/2006  . DIVERTICULITIS, HX OF 11/15/2006  . PCOS (polycystic ovarian syndrome) 10/16/2006  . Hyperlipidemia LDL goal <100 10/16/2006  . Morbid obesity with BMI of 50.0-59.9, adult ([place]) 10/16/2006  . Generalized anxiety disorder 10/16/2006  . PANIC DISORDER 10/16/2006  . BULIMIA 10/16/2006  . Chronic depression 10/16/2006  . CARPAL TUNNEL SYNDROME 10/16/2006  . LYMPHEDEMA 10/16/2006  . Allergic rhinitis 10/16/2006  . Mild intermittent asthma 10/16/2006  . TMJ SYNDROME 10/16/2006  . GERD 10/16/2006  . IRRITABLE BOWEL SYNDROME 10/16/2006  . OSTEOARTHRITIS 10/16/2006  . Myalgia and myositis 10/16/2006  . COUGH, CHRONIC 10/16/2006   Past Medical History:  Diagnosis Date  . Allergy    allergic rhinitis  . Anemia   . Anxiety   . Arthritis    osteoarthritis  . Asthma   . Claustrophobia    does not like oxygen mask covering face  . Depression   . Diabetes mellitus without complication ([place])   . Fatty liver 07/2008   abd. ultrasound - fatty liver ; slt dilated cbd (no stones ) 06/10// abd.  ultrasound normal on 04/2006  . Fibromyalgia   . GERD (gastroesophageal reflux disease)    EGD negative 06/2001// EGD erythematous mucosa, polyp 08/2008  .  High uric acid in 24 hour urine specimen   . Hyperhidrosis   . Hyperlipidemia   . Kidney stones   . Lymphedema   . Neuromuscular disorder (HCC)    fibromyalgia  . Obesity   . Shortness of breath dyspnea   . Tachycardia   . TMJ (dislocation of temporomandibular joint)   . UTI (lower urinary tract infection)    Past Surgical History:  Procedure Laterality Date  . BREAST BIOPSY Right 2016   neg  . BREAST LUMPECTOMY WITH RADIOACTIVE SEED LOCALIZATION Right 08/02/2014   Procedure: BREAST LUMPECTOMY WITH RADIOACTIVE SEED LOCALIZATION;  Surgeon: Rolm Bookbinder, MD;   Location: [place];  Service: General;  Laterality: Right;  . COLONOSCOPY    . ENDOMETRIAL BIOPSY  01/2004  . ESOPHAGOGASTRODUODENOSCOPY    . FOOT SURGERY    . SEPTOPLASTY     two times  . TONSILLECTOMY    . UTERINE FIBROID SURGERY  06/2005   ablation  . uterine tumor  09/2001  . VAGUS NERVE STIMULATOR INSERTION     Social History   Tobacco Use  . Smoking status: Never Smoker  . Smokeless tobacco: Never Used  Substance Use Topics  . Alcohol use: No    Alcohol/week: 0.0 oz  . Drug use: No   Family History  Problem Relation Age of Onset  . Hypertension Father   . Cancer Father        pancreatic cancer  . Hypertension Sister   . Heart disease Maternal Grandmother 60       MI  . Diabetes Maternal Grandmother   . Heart disease Paternal Grandmother 60       MI  . Diabetes Paternal Grandmother   . Breast cancer Neg Hx    Allergies  Allergen Reactions  . Cephalexin   . Codeine     REACTION: ? reaction  . Duloxetine   . Levaquin [Levofloxacin] Nausea Only  . Lisinopril Cough  . Metformin And Related Diarrhea    GI side effects   . Prednisone     Tolerates intraarticular depo-medrol.  . Quetiapine Other (See Comments)  . Sertraline Hcl     REACTION: vivid dreams  . Simvastatin Other (See Comments)    achey  . Triazolam     REACTION: ? reaction  . Zaleplon   . Zocor [Simvastatin - High Dose]     achey  . Zolpidem Tartrate     hallucinations  . Hydrocodone Rash    REACTION: ? reaction REACTION: ? reaction  . Norelgestromin-Eth Estradiol     Patch didn't stick to skin   Current Outpatient Medications on File Prior to Visit  Medication Sig Dispense Refill  . ACCU-CHEK AVIVA PLUS test strip Use to check blood sugar  once a day 100 each 0  . ACCU-CHEK SOFTCLIX LANCETS lancets Use to check blood sugar  once a day 100 each 0  . acetaminophen (TYLENOL) 500 MG tablet Take 500 mg by mouth every 6 (six) hours as needed.     Marland Kitchen albuterol (PROVENTIL HFA;VENTOLIN HFA) 108  (90 Base) MCG/ACT inhaler Inhale 2 puffs into the lungs every 4 (four) hours as needed for wheezing. 1 Inhaler 3  . ALPRAZolam (XANAX) 1 MG tablet Take 2 mg by mouth 3 (three) times daily as needed.     . benzonatate (TESSALON) 200 MG capsule TAKE 1 CAPSULES BY MOUTH 3 TIMES DAILY AS NEEDED FOR COUGH 90 capsule 1  . Blood Glucose Calibration (ACCU-CHEK AVIVA) SOLN     .  buPROPion (WELLBUTRIN XL) 300 MG 24 hr tablet     . Cetirizine HCl 10 MG CAPS Take 1 capsule by mouth daily.    . Cholecalciferol (VITAMIN D3) 10000 units TABS Take 1 capsule by mouth once a week.    . cyclobenzaprine (FLEXERIL) 5 MG tablet TAKE ONE TABLET THREE TIMES A DAY IF NEEDED FOR MUSCLE SPASM 30 tablet 3  . Dextromethorphan-Guaifenesin (TUSSIN DM PO) Take by mouth daily as needed.    . fluconazole (DIFLUCAN) 150 MG tablet Take 1 tablet (150 mg total) by mouth once a week for 4 doses. 4 tablet 0  . FLUoxetine (PROZAC) 40 MG capsule Take 80 mg by mouth daily.     . furosemide (LASIX) 20 MG tablet Take 1 tablet (20 mg total) by mouth daily. Prn edema 30 tablet 3  . gabapentin (NEURONTIN) 300 MG capsule TAKE 1 CAPSULE BY MOUTH 3 TIMES DAILY 90 capsule 5  . glipiZIDE (GLUCOTROL XL) 5 MG 24 hr tablet Take by mouth.    Marland Kitchen ibuprofen (ADVIL,MOTRIN) 600 MG tablet Take 1 tablet (600 mg total) by mouth every 8 (eight) hours as needed. 30 tablet 0  . lisinopril (PRINIVIL,ZESTRIL) 5 MG tablet Take 5 tablets by mouth daily.    Marland Kitchen loratadine (CLARITIN) 10 MG tablet Take by mouth.    . losartan (COZAAR) 25 MG tablet Take by mouth.    . miconazole (MICOTIN) 2 % cream Apply 1 application topically 2 (two) times daily. 198 g 0  . norethindrone-ethinyl estradiol (JUNEL FE,GILDESS FE,LOESTRIN FE) 1-20 MG-MCG tablet Take 1 tablet by mouth daily.    . norethindrone-ethinyl estradiol (MICROGESTIN,JUNEL,LOESTRIN) 1-20 MG-MCG tablet Take by mouth.    Marland Kitchen omeprazole (PRILOSEC) 20 MG capsule TAKE 1 CAPSULE BY MOUTH TWO TIMES DAILY 180 capsule 1  .  oxybutynin (DITROPAN XL) 15 MG 24 hr tablet TAKE 1 TABLET BY MOUTH DAILY 90 tablet 1  . PRAVASTATIN SODIUM PO Take 1 tablet by mouth daily.    Marland Kitchen spironolactone (ALDACTONE) 100 MG tablet Take 100 mg by mouth daily.     Marland Kitchen talc (ZEASORB) powder Apply topically as needed. (Patient taking differently: Apply 1 application topically daily as needed (sweat). ) 240 g 3  . tamsulosin (FLOMAX) 0.4 MG CAPS capsule Take 1 capsule (0.4 mg total) by mouth daily. 30 capsule 0  . traZODone (DESYREL) 150 MG tablet Take 450 mg by mouth at bedtime.     . TRULICITY 1.5 SW/1.0XN SOPN INJECT CONTENTS OF 1 PEN ONCE A WEEK 2 mL 3  . vitamin B-12 (CYANOCOBALAMIN) 1000 MCG tablet Take 1,000 mcg by mouth daily.     No current facility-administered medications on file prior to visit.     Review of Systems  Constitutional: Positive for fatigue. Negative for activity change, appetite change, fever and unexpected weight change.  HENT: Positive for ear pain and postnasal drip. Negative for congestion, ear discharge, rhinorrhea, sinus pressure and sore throat.   Eyes: Negative for pain, redness and visual disturbance.  Respiratory: Positive for cough. Negative for shortness of breath and wheezing.        Chronic intermittent cough  Cardiovascular: Negative for chest pain and palpitations.  Gastrointestinal: Negative for abdominal pain, blood in stool, constipation and diarrhea.  Endocrine: Negative for polydipsia and polyuria.  Genitourinary: Negative for dysuria, frequency and urgency.  Musculoskeletal: Positive for arthralgias, back pain and gait problem. Negative for myalgias.  Skin: Positive for rash. Negative for pallor.  Allergic/Immunologic: Negative for environmental allergies.  Neurological: Negative for  dizziness, syncope and headaches.  Hematological: Negative for adenopathy. Does not bruise/bleed easily.  Psychiatric/Behavioral: Positive for dysphoric mood. Negative for decreased concentration. The patient is  nervous/anxious.        Objective:   Physical Exam  Constitutional: She appears well-developed and well-nourished. No distress.  Morbidly obese and well app  HENT:  Head: Normocephalic and atraumatic.  Mouth/Throat: Oropharynx is clear and moist.  Eyes: Conjunctivae and EOM are normal. Pupils are equal, round, and reactive to light. No scleral icterus.  Neck: Normal range of motion. Neck supple. Carotid bruit is not present.  Cardiovascular: Normal rate, regular rhythm and normal heart sounds.  Pulmonary/Chest: Effort normal and breath sounds normal. No respiratory distress. She has no wheezes. She has no rales.  Abdominal: Soft. Bowel sounds are normal. She exhibits no distension. There is no tenderness.  Very large pannus due to obesity  Musculoskeletal: She exhibits edema.  Baseline pedal lymphedema  Unchanged   Lymphadenopathy:    She has no cervical adenopathy.  Neurological: She is alert. She has normal reflexes. No cranial nerve deficit.  Skin: Skin is warm and dry. Rash noted. There is erythema. No pallor.  Candidal intertrigo under L side of pannus is much improved  Scantly pink/less defined border and no satellite lesions or excoriations Few stretch marks laterally   Area well cleaned and dried Miconazole applied with sterile abd pads   Psychiatric: Her speech is normal and behavior is normal. Her mood appears not anxious. Her affect is not blunt, not labile and not inappropriate. She exhibits a depressed mood.  Tearful slt when discussing a sick friend with cancer           Assessment & Plan:   Problem List Items Addressed This Visit      Endocrine   Type 2 diabetes mellitus without complication, without long-term current use of insulin (HCC)    Pt continues to see endocrinologist  On trulicity        Musculoskeletal and Integument   Candidal intertrigo - Primary    Acute on chronic  Rev notes from ED as well as last 2 enc here with Garnette Gunner and [place]    Reassuring exam of area under L pannus  -fading pink color with less scalloped edge and no skin breakdown No oozing or evidence of it on pad  Area cleaned with sterile water and well dried  Abdominal pads applied over miconazole cream  She wants to attempt to change drsg herself tomorrow (challenge with morbid obesity)  Will have re visit with [facility] for skin check/drsg change on Friday  If stable to imp-can go ahead and transition to nystatin powder bid and obs  She feels she could do this at home  Disc opt for tx of morbid obesity as well       Relevant Medications   nystatin (MYCOSTATIN/NYSTOP) powder     Other   Morbid obesity with BMI of 50.0-59.9, adult ([place])    Pt is interested in surgical tx again-has not looked into it for a long time  Disc info session done by [facility]  Showed interest Mental health issues are barrier to wt loss  Morbid obesity is becoming a huge hindrance to self care and independence (which worsens depression) Again disc red of simple carbs/processed foods when able  Exercise as tol       Right ear pain    Nl exam-suspect intermittent ETD in ear with prior infections Recommend re starting flonase ns daily as directed  Update if not starting to improve in a week or if worsening

## 2017-02-20 NOTE — Patient Instructions (Signed)
Use the flonase nasal spray to open up your ear/sinus system   Follow up on Friday for a skin re check   Keep area clean and dry the best you can   Here is a px for nystatin powder for use after we are done with the miconazole

## 2017-02-20 NOTE — Assessment & Plan Note (Signed)
Pt is interested in surgical tx again-has not looked into it for a long time  Disc info session done by [facility]  Showed interest Mental health issues are barrier to wt loss  Morbid obesity is becoming a huge hindrance to self care and independence (which worsens depression) Again disc red of simple carbs/processed foods when able  Exercise as tol

## 2017-02-20 NOTE — Telephone Encounter (Signed)
Pt called because she has only 1 ABD pad and Telfa pads. Pt is asking if she can use these to change her dressing for tomorrow. She wants to fold the Telfa pad in half and put that in place. Pt calling to see if that is ok. Would apprecitate a call back  Reason for Disposition . [1] Follow-up call to recent contact AND [2] information only call, no triage required  Protocols used: INFORMATION ONLY CALL-A-AH

## 2017-02-20 NOTE — Assessment & Plan Note (Signed)
Nl exam-suspect intermittent ETD in ear with prior infections Recommend re starting flonase ns daily as directed Update if not starting to improve in a week or if worsening

## 2017-02-21 NOTE — Telephone Encounter (Signed)
Left VM requesting pt to call the office back (noted CRM)

## 2017-02-21 NOTE — Telephone Encounter (Signed)
Pt called back in regards to wound dressing; she needs further direction on dressing her wound; pt states that she has an appointment scheduled for tomorrow; will route to [place] for further direction; pt can be contacted at [phone].

## 2017-02-21 NOTE — Telephone Encounter (Signed)
Spoke with patient.  Confirmed that I do have her on my schedule for 11am tomorrow 1/11 for dressing change.  Also, reviewed instructions for dressing application today using supplies that patient has on hand.  Patient verbalizes understanding.

## 2017-02-22 ENCOUNTER — Ambulatory Visit: Payer: Medicare Other

## 2017-02-22 ENCOUNTER — Ambulatory Visit: Payer: Self-pay

## 2017-02-22 DIAGNOSIS — B372 Candidiasis of skin and nail: Secondary | ICD-10-CM

## 2017-02-22 NOTE — Progress Notes (Signed)
Patient came in for wound care of yeast infection under Left pannus.    Area is almost completely healed.  D/C'ed miconazole and ABD padding at this point due to healing (okay per 02/20/17 ov note). Skin is intact and without any odor or drainage.  Area cleansed with NS and patted dry prior to applying Nystatin powder to areas.  Patient educated on proper technique to apply powder.  She was encouraged to do the best she can with reaching areas and to keep [place] informed with how she is doing.

## 2017-02-25 ENCOUNTER — Telehealth: Payer: Self-pay | Admitting: Family Medicine

## 2017-02-25 NOTE — Telephone Encounter (Signed)
Please let pt know that I looked into [facility] for weight loss surgery and can do a referral if she would like to attend one of their information presentations   Let me know what she thinks It may be worth learning a bit about it   Thanks

## 2017-02-25 NOTE — Telephone Encounter (Signed)
Called cell # and no answer and VM wasn't set up, called home and left VM requesting pt to call back (CRM created)

## 2017-03-01 NOTE — Telephone Encounter (Signed)
Pt said that she is getting over "some stuff" right now and declined to get a referral to the information presentation for the weight loss surgery. Pt said once she gets over everything she is going through if she decides to go to one in the future she will call us back and request the referral but for right now she doesn't want to pursue this

## 2017-03-02 IMAGING — US US  BREAST BX W/ LOC DEV 1ST LESION IMG BX SPEC US GUIDE*R*
1 series · 8 of 8 positions shown · non-contrast
Comparison: Previous exam(s).

ADDENDUM:
Pathology of the right breast biopsy revealed FIBROCYSTIC CHANGES
WITH ASSOCIATED MICROCALCIFICATIONS. PSEUDO-ANGIOMATOUS STROMAL
HYPERPLASIA. NEGATIVE FOR ATYPIA AND MALIGNANCY. Note: Fibrocystic
changes are present and include focal usual ductal hyperplasia,
apocrine cysts with fibrosis of the cyst wall, microcyst formation,
and columnar cell change. Focal chronic inflammation within the
stroma is seen.

This was found to be discordant by Dr. Tat Tiger.
At the patient's request, pathology and recommendations were relayed
to the patient by Dr. Nya. She stated she is doing well following
the biopsy. She was instructed to call the [HOSPITAL]
with any questions or concerns.
Recommendations: Surgical excision. Biopsy results are benign and
discordant. Due to distortion on imaging, surgical excision is
recommended. This was discussed with the patient by Dr. Nya both
prior to the biopsy and at the time results were given on 06/28/14.
Recommendations were relayed to Vance, Dr. Majkell nurse on 06/29/14
by Dx, Isthiyag. The surgical referral will be made by Dr.
[REDACTED].
CLINICAL DATA: Patient with distortion and right breast mass, for
percutaneous biopsy.
EXAM:
ULTRASOUND GUIDED RIGHT BREAST CORE NEEDLE BIOPSY

[Series 1: us breast bx w/ loc dev 1st lesion img bx spec us  · 0.08mm/px · 8 of 8 slices shown]
[im 1/8]
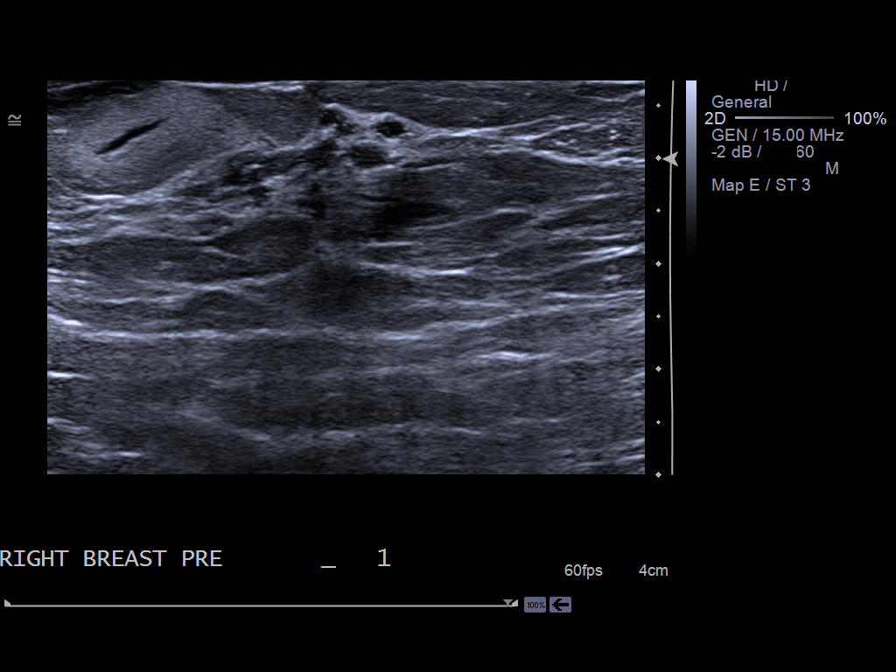
[im 2/8]
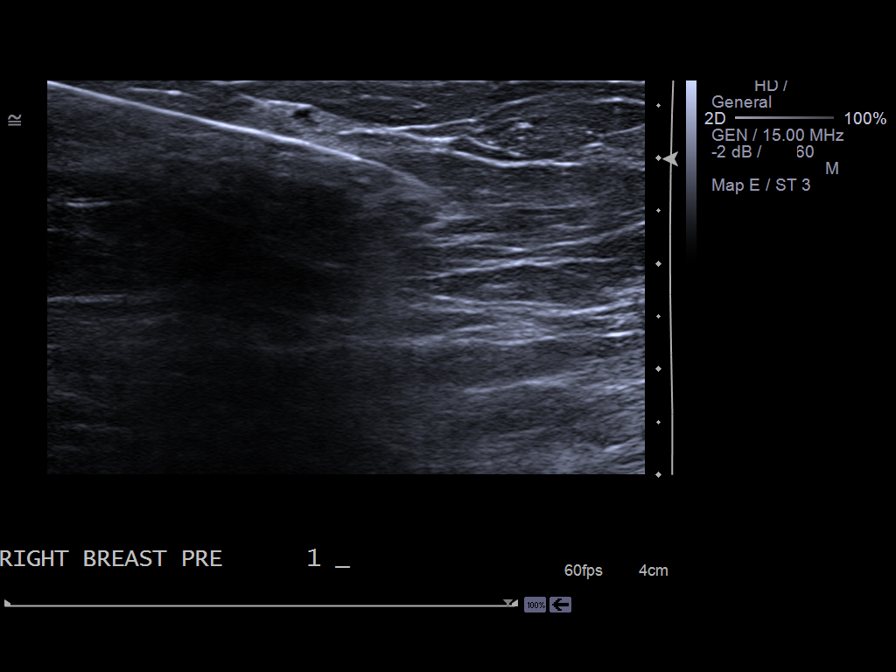
[im 3/8]
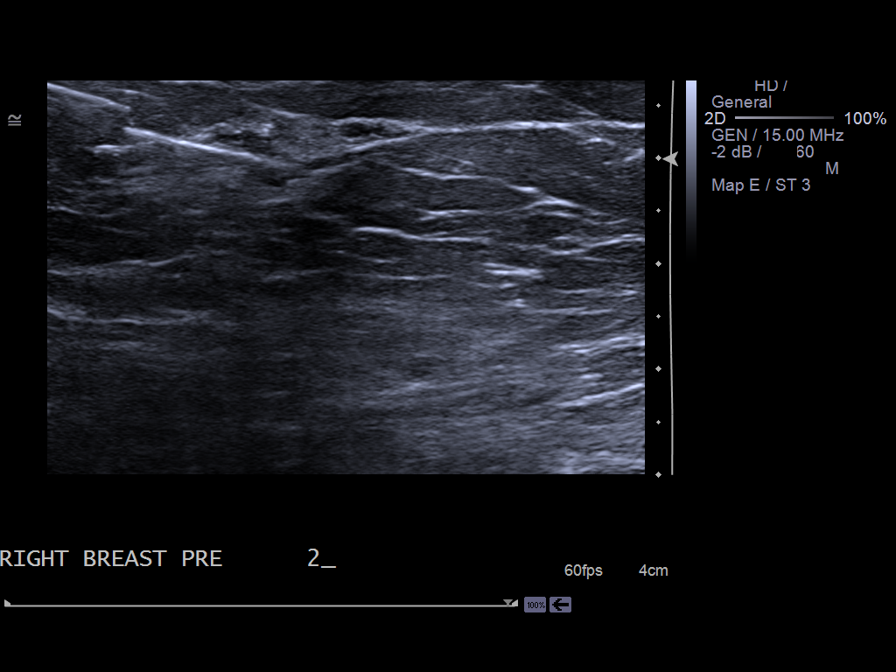
[im 4/8]
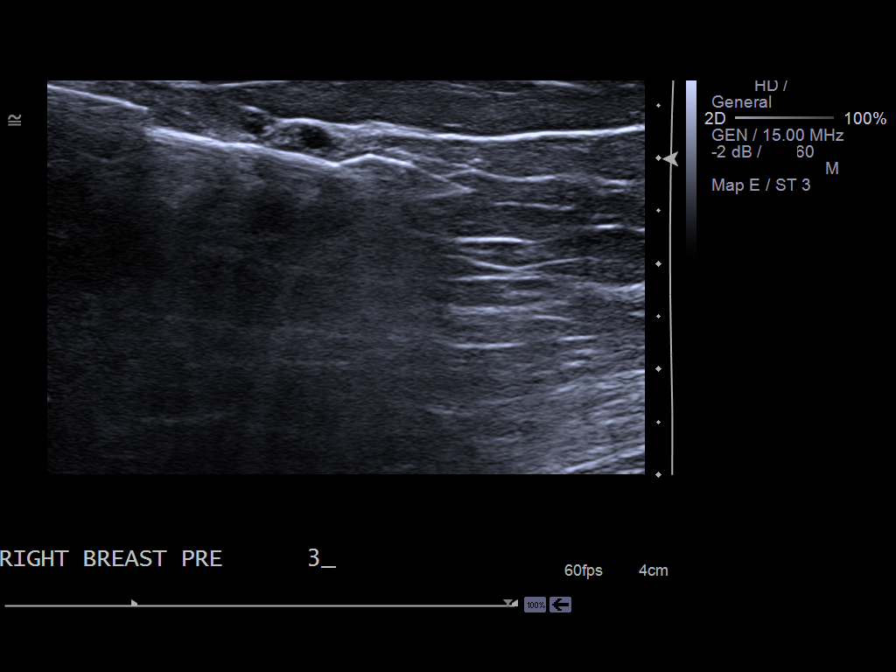
[im 5/8]
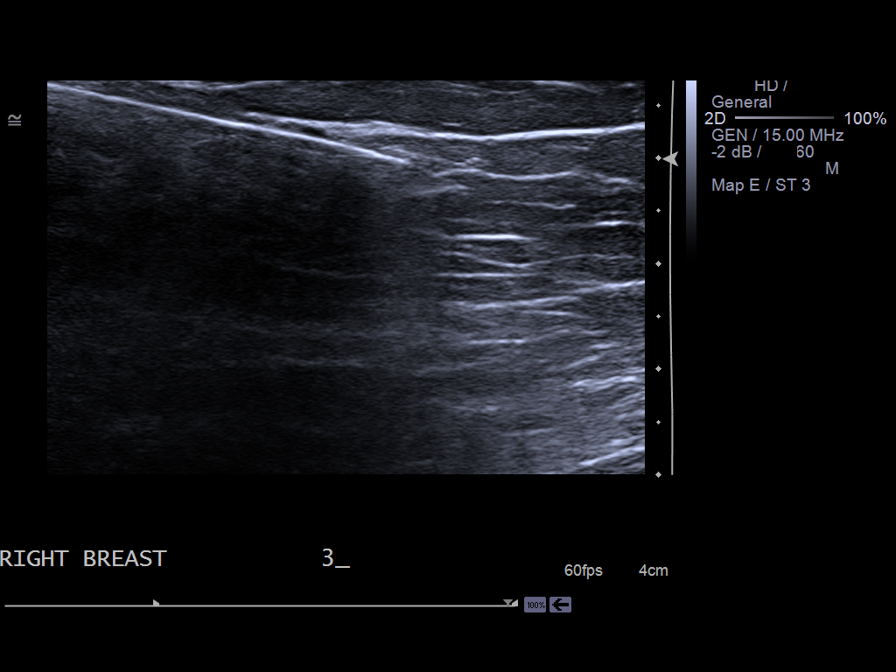
[im 6/8]
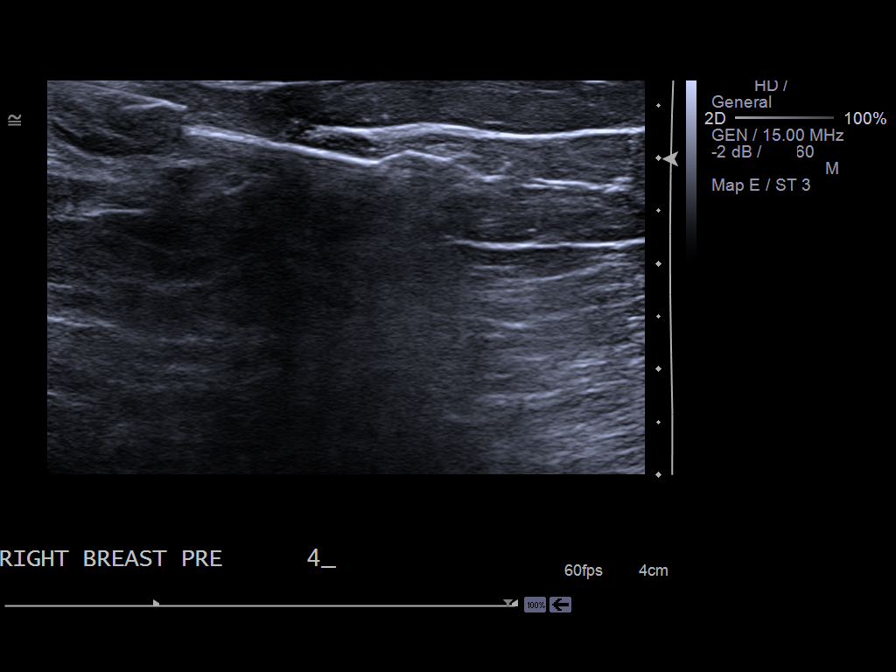
[im 7/8]
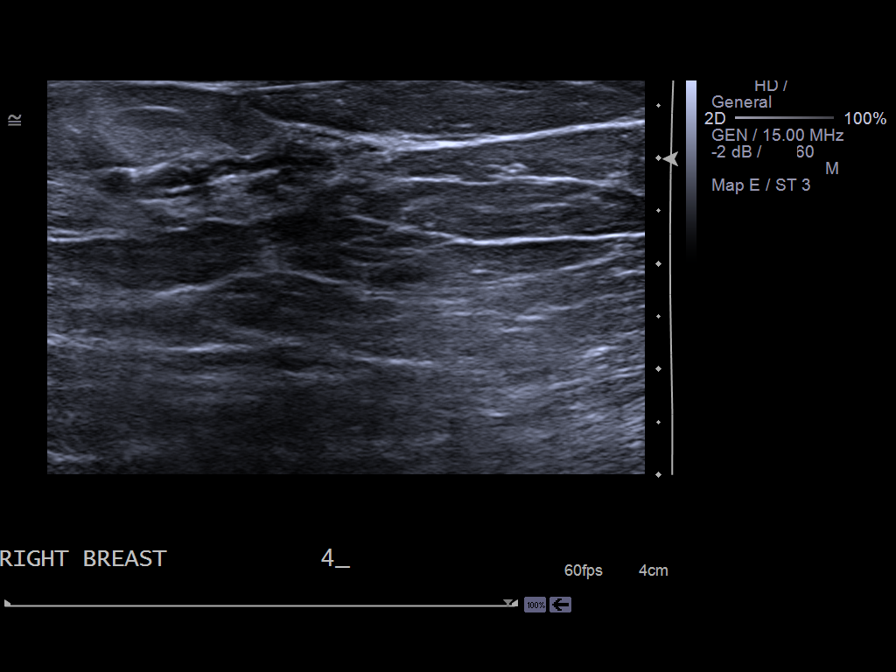
[im 8/8]
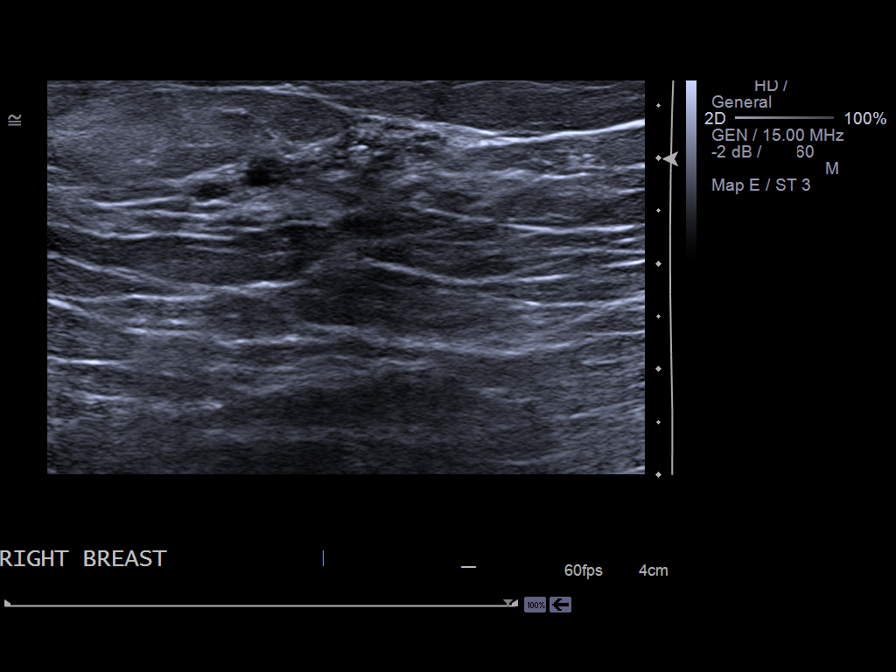

[8 of 8 positions shown; findings below may reference images not displayed]

PROCEDURE:
I met with the patient and we discussed the procedure of
ultrasound-guided biopsy, including benefits and alternatives. We
discussed the high likelihood of a successful procedure. We
discussed the risks of the procedure including infection, bleeding,
tissue injury, clip migration, and inadequate sampling. Informed
written consent was given. The usual time-out protocol was performed
immediately prior to the procedure.

Using sterile technique and 2% Lidocaine as local anesthetic, under
direct ultrasound visualization, a 12 gauge vacuum-assisted device
was used to perform biopsy of the right breast mass and distortion
within the 11 o'clock positionusing a lateral approach. At the
conclusion of the procedure, a coil shaped tissue marker clip was
deployed into the biopsy cavity. Follow-up 2-view mammogram was
performed and dictated separately.
IMPRESSION: Ultrasound-guided biopsy of right breast mass and distortion. No
apparent complications.

## 2017-03-28 ENCOUNTER — Ambulatory Visit: Payer: Medicare Other | Admitting: Podiatry

## 2017-04-03 ENCOUNTER — Other Ambulatory Visit: Payer: Self-pay | Admitting: *Deleted

## 2017-04-03 MED ORDER — BENZONATATE 200 MG PO CAPS: ORAL_CAPSULE | ORAL | 2 refills | Status: DC

## 2017-04-03 NOTE — Telephone Encounter (Signed)
done

## 2017-04-03 NOTE — Telephone Encounter (Signed)
Please refill tessalon times 2  I think the OC comes from gyn

## 2017-04-03 NOTE — Telephone Encounter (Signed)
Received fax requesting refills of meds. Pt has had a few recent acute appts., tessalon last filled on 10/18/16 #90 caps with 1 additional refills, BCP looks like it was a historical entry and I can't see any past refills so not sure if you fill this Rx, please advise

## 2017-04-05 ENCOUNTER — Encounter: Payer: Self-pay | Admitting: Family Medicine

## 2017-04-05 ENCOUNTER — Ambulatory Visit: Payer: Medicare Other | Admitting: Family Medicine

## 2017-04-05 VITALS — BP 122/74 | HR 70 | Temp 98.3°F | Wt 312.2 lb

## 2017-04-05 DIAGNOSIS — E1151 Type 2 diabetes mellitus with diabetic peripheral angiopathy without gangrene: Secondary | ICD-10-CM

## 2017-04-05 DIAGNOSIS — E119 Type 2 diabetes mellitus without complications: Secondary | ICD-10-CM | POA: Diagnosis not present

## 2017-04-05 DIAGNOSIS — E282 Polycystic ovarian syndrome: Secondary | ICD-10-CM

## 2017-04-05 DIAGNOSIS — E1142 Type 2 diabetes mellitus with diabetic polyneuropathy: Secondary | ICD-10-CM

## 2017-04-05 DIAGNOSIS — Z6841 Body Mass Index (BMI) 40.0 and over, adult: Secondary | ICD-10-CM

## 2017-04-05 DIAGNOSIS — F32A Depression, unspecified: Secondary | ICD-10-CM

## 2017-04-05 DIAGNOSIS — F329 Major depressive disorder, single episode, unspecified: Secondary | ICD-10-CM | POA: Diagnosis not present

## 2017-04-05 DIAGNOSIS — K582 Mixed irritable bowel syndrome: Secondary | ICD-10-CM | POA: Diagnosis not present

## 2017-04-05 DIAGNOSIS — R1084 Generalized abdominal pain: Secondary | ICD-10-CM

## 2017-04-05 DIAGNOSIS — N951 Menopausal and female climacteric states: Secondary | ICD-10-CM

## 2017-04-05 NOTE — Patient Instructions (Addendum)
I think the spironolactone is helping facial hair growth- if this is bothersome then re start it   I think menopause is causing the temperature changes   Reach out to your counselor as much as you need to   See Otila Kluver next week and see Dr Lajoyce Corners next week as planned   I think some of your GI symptoms are coming from the mental distress  Eat when you can (when the nausea allows her)    I will refer you to a new gyn in [place]  Also I will do referral for abdominal ultrasound to look for gallstones
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
## 2017-04-07 DIAGNOSIS — E1151 Type 2 diabetes mellitus with diabetic peripheral angiopathy without gangrene: Secondary | ICD-10-CM | POA: Insufficient documentation

## 2017-04-07 DIAGNOSIS — E1142 Type 2 diabetes mellitus with diabetic polyneuropathy: Secondary | ICD-10-CM | POA: Insufficient documentation

## 2017-04-07 DIAGNOSIS — N951 Menopausal and female climacteric states: Secondary | ICD-10-CM | POA: Insufficient documentation

## 2017-04-07 MED ORDER — SPIRONOLACTONE 100 MG PO TABS: 100.0000 mg | ORAL_TABLET | Freq: Every day | ORAL | 3 refills | Status: DC

## 2017-04-07 NOTE — Assessment & Plan Note (Signed)
Pt states with chronic GI problems she eats very little  Unable to exercise  Had entertained idea of bariatric surgery in the past but emotional situation is very bad today  Have worked with nutritionists w/o success as well

## 2017-04-07 NOTE — Assessment & Plan Note (Signed)
Worse hot and cold spells and mood after stopping her OC  Referring to new gyn

## 2017-04-07 NOTE — Assessment & Plan Note (Signed)
Pt sees endocrinology 

## 2017-04-07 NOTE — Assessment & Plan Note (Signed)
Suspect this has worsened with poor mood/worse dep and anxiety

## 2017-04-07 NOTE — Assessment & Plan Note (Signed)
With hx of IBS worse with mental distress lately  Now more RUQ pain  Rev last [place] -2013  She is high risk for gallstones- will work on scheduling another [place]  Nausea may be related to this or her emotional problems  Update if vomiting or blood in stool/ fever or other symptoms  Antacids/pepto prn

## 2017-04-07 NOTE — Assessment & Plan Note (Signed)
No change  Sees endocrinology for DM

## 2017-04-07 NOTE — Assessment & Plan Note (Signed)
Sees endocrinology

## 2017-04-07 NOTE — Assessment & Plan Note (Signed)
Pt wishes to see a gyn closer/in [place]  She is morbidly obese and at risk for endometrial hyperplasia  Has had an endometrial ablation in the past  Stopped her combined OC recently-suspect she is having menopausal symptoms  Stopped her spironolactone (agrees to re start it with facial hair growth) -will refill this today Intol of metformin [facility] endocrinologist for DM2

## 2017-04-07 NOTE — Assessment & Plan Note (Signed)
With generalized anx disorder Sees Dr Mackenzie Bonilla appt on Monday  Stopped her prozac and wellbutrin several mo ago - because she thought they were not working  With this and menopausal symptoms - depression and anxiety are much worse (in fact interview was difficult today)  She has f/u with Dr Mackenzie Bonilla on Monday and her counselor Mackenzie Bonilla later in the week  Her outlook is poor for improvement but denies SI No paranoia or delusions  Voices frustration over lack of support and inability to afford more freq counseling appointments  Tearful today  No doubt emotional symptoms are worsening physical ones- disc this Reviewed stressors/ coping techniques/symptoms/ support sources/ tx options and side effects in detail today

## 2017-04-17 LAB — HEMOGLOBIN A1C: HEMOGLOBIN A1C: 7

## 2017-04-18 ENCOUNTER — Encounter: Payer: Self-pay | Admitting: Internal Medicine

## 2017-04-18 ENCOUNTER — Ambulatory Visit
Admission: RE | Admit: 2017-04-18 | Discharge: 2017-04-18 | Disposition: A | Discharge status: Home / Self Care | Payer: Medicare Other | Source: Ambulatory Visit | Attending: Family Medicine | Admitting: Family Medicine

## 2017-04-18 DIAGNOSIS — K8020 Calculus of gallbladder without cholecystitis without obstruction: Secondary | ICD-10-CM | POA: Diagnosis not present

## 2017-04-18 DIAGNOSIS — R932 Abnormal findings on diagnostic imaging of liver and biliary tract: Secondary | ICD-10-CM | POA: Diagnosis not present

## 2017-04-18 DIAGNOSIS — R1084 Generalized abdominal pain: Secondary | ICD-10-CM | POA: Insufficient documentation

## 2017-04-19 ENCOUNTER — Telehealth: Payer: Self-pay | Admitting: Family Medicine

## 2017-04-19 DIAGNOSIS — K8020 Calculus of gallbladder without cholecystitis without obstruction: Secondary | ICD-10-CM | POA: Insufficient documentation

## 2017-04-19 NOTE — Telephone Encounter (Signed)
Pt notified of Dr. Marliss Coots comments and recommendations. Pt will come in Monday for labs please put orders in

## 2017-04-19 NOTE — Telephone Encounter (Signed)
Not sure how long it will take to get in-I will send ref to [facility]  For constipation- take miralax one to three doses per day as needed until bowels start moving   We should get a liver profile (labs) on her while waiting for the referral (gallstones can affect this)  Please schedule lab appt

## 2017-04-19 NOTE — Telephone Encounter (Deleted)
Addressed in result notes  

## 2017-04-19 NOTE — Telephone Encounter (Signed)
Copied from [place] [phone]. Topic: Quick Communication - See Telephone Encounter >> Apr 19, 2017  2:44 PM Percell Belt A wrote: CRM for notification. See Telephone encounter for: pt called in and would like someone to call her with her test results today.  She just had an ultrasound done yesterday  Best number [phone]   04/19/17.

## 2017-04-19 NOTE — Telephone Encounter (Signed)
-----   Message from Tammi Sou, [place] sent at 04/19/2017  4:45 PM EST ----- Pt notified of [place] results and Dr. Marliss Coots comments. She does want to see a surgeon in [place], pt didn't know how urgently you can do referral because she is having lower back pain, her stomach feels bloated. Also she did want me to let Dr. Glori Bickers know she is pretty constipated pt said she usually eats fried shrimp when she's constipated and it usually helps her have a BM but that didn't work, please advise pt on what she should do

## 2017-04-20 NOTE — Telephone Encounter (Signed)
Orders done

## 2017-04-22 ENCOUNTER — Other Ambulatory Visit (INDEPENDENT_AMBULATORY_CARE_PROVIDER_SITE_OTHER): Payer: Medicare Other

## 2017-04-22 ENCOUNTER — Encounter: Payer: Self-pay | Admitting: General Surgery

## 2017-04-22 ENCOUNTER — Telehealth: Payer: Self-pay | Admitting: *Deleted

## 2017-04-22 DIAGNOSIS — K8020 Calculus of gallbladder without cholecystitis without obstruction: Secondary | ICD-10-CM

## 2017-04-22 LAB — HEPATIC FUNCTION PANEL
ALBUMIN: 4.1 g/dL (ref 3.5–5.2)
ALT: 12 U/L (ref 0–35)
AST: 11 U/L (ref 0–37)
Alkaline Phosphatase: 58 U/L (ref 39–117)
Bilirubin, Direct: 0.1 mg/dL (ref 0.0–0.3)
TOTAL PROTEIN: 6.9 g/dL (ref 6.0–8.3)
Total Bilirubin: 0.3 mg/dL (ref 0.2–1.2)

## 2017-04-22 NOTE — Telephone Encounter (Signed)
Left VM letting pt know Dr. Marliss Coots comments and instructions and to call back if she has any other questions

## 2017-04-22 NOTE — Telephone Encounter (Signed)
Pt was here for labs and requested a copy of her [place] results. Pt reviewed them and understood the part about the gallstones but she said she wasn't aware of the result that said "Echogenic liver compatible with chronic liver disease" She wants to know what is a echogenic liver and what does chronic liver disease mean. She said that worried her a lot and she wants to know what Dr. Glori Bickers thinks about it

## 2017-04-22 NOTE — Telephone Encounter (Signed)
This means she most likely has a condition called fatty liver - caused by being overweight or obese and a high fat diet  Weight loss it the treatment It is very common especially as an incidental finding  She was given handouts today regarding diet/eating for gallstones and also fatty liver disease (both stressing imp of low fat diet and wt loss)

## 2017-04-29 ENCOUNTER — Encounter: Payer: Self-pay | Admitting: *Deleted

## 2017-04-30 ENCOUNTER — Ambulatory Visit: Payer: Medicare Other | Admitting: General Surgery

## 2017-04-30 ENCOUNTER — Encounter: Payer: Self-pay | Admitting: General Surgery

## 2017-04-30 VITALS — BP 118/78 | HR 96 | Resp 16 | Ht 64.0 in | Wt 322.0 lb

## 2017-04-30 DIAGNOSIS — K591 Functional diarrhea: Secondary | ICD-10-CM

## 2017-04-30 DIAGNOSIS — R1011 Right upper quadrant pain: Secondary | ICD-10-CM | POA: Diagnosis not present

## 2017-04-30 NOTE — Patient Instructions (Addendum)
Schedule HIDA scan  Gallbladder Nuclear Scan A gallbladder nuclear scan (hepatobiliary scan or HIDA scan) is an imaging test that checks the function of your liver, your gallbladder, and the ducts of those organs. These parts make up your hepatobiliary system. You may need this scan if you have symptoms of liver or gallbladder disease. This scan is done with a camera that detects radioactive energy (gamma rays). For this exam, you will be given a radioactive substance, called a radiotracer, through an IV tube inserted in your hand or arm. As the radiotracer moves through your hepatobiliary system, the camera and a computer detect the gamma rays and form them into images that show how well your system is working. Tell a health care provider about:  Any possibility of pregnancy or if you are breastfeeding.  Any allergies you have.  All medicines you are taking, including vitamins, herbs, eye drops, creams, and over-the-counter medicines.  Any problems you or family members have had with anesthetic medicines.  Any blood disorders you have.  Any surgeries you have had.  Any medical conditions you have. What happens before the procedure?  Take medicines only as directed by your health care provider.  Your health care provider will let you know when you should start fasting. You may not be able to eat or drink anything after midnight on the night before the procedure or for at least four hours before the test.  Do not wear jewelry to the exam.  Wear loose, comfortable clothing. You may be asked to put on a gown for the procedure. What happens during the procedure?  An IV tube will be inserted into a vein in your hand or arm. It will remain in place throughout the exam.  A small amount of the radiotracer material will be injected through your IV tube.  You may feel a cold sensation as the material runs through your IV tube.  While you are lying down, a technician will place the gamma  camera over your abdomen.  The camera may rotate around your body. You may be asked to stay still or move into a certain position.  You will then be given a medicine called cholecystokinin (CCK) through your IV tube. This will make your gallbladder empty. You may feel nauseous or have cramping for a short time.  Additional images will be taken after CCK is given.  After all the images have been taken, your IV tube will be removed. What happens after the procedure?  You may resume normal activities and diet as directed by your health care provider.  The radiotracer will leave your body over the next few days. There are no long-term side effects from the radiotracer. Drink lots of fluids to help flush it out of your body.  A health care provider who specializes in nuclear medicine will read the scan and send a report to your regular health care provider. This information is not intended to replace advice given to you by your health care provider. Make sure you discuss any questions you have with your health care provider. Document Released: 01/27/2000 Document Revised: 07/07/2015 Document Reviewed: 06/03/2013 Elsevier Interactive Patient Education  [employer].   The patient is scheduled for a HIDA scan at [facility] on 05/09/17 at 12:30 pm. She is to arrive there by 12:00 pm and have nothing to eat or drink for 6 hours prior. The patient is aware of date, time, and instructions.

## 2017-04-30 NOTE — Progress Notes (Signed)
Patient ID: Mackenzie Bonilla, female   DOB: Jan 21, 1966, 52 y.o.   MRN: [id number]  Chief Complaint  Patient presents with  . Other    gall stones    HPI Mackenzie Bonilla is a 52 y.o. female.  Here for evaluation of gall stones referred by Dr Mackenzie Bonilla. She states she is having right sided abdominal/ lower chest wall pain for 6-7 months radiating to her back ( flank) and into the apex of her right axilla.  She does not feel eating makes the pain worse. She does feel activity can make the pain worse. She states for 3-4 days she has been hurting under her right axilla. She thought it was a pulled muscle at first.   She does admit to diarrhea (watery and pudding) and nausea. She does admit to constipation for 2 weeks with a history of IBS. Sometimes she doesn't make it through a meal before she has to go to the bathroom.   She feels everything food wise bothers her with her bowels.   She has not been drinking a lot due to the nausea.  Abdominal ultrasound was 04-18-17.  Most recent Hem A1C was 7.0.  HPI  Past Medical History:  Diagnosis Date  . Allergy    allergic rhinitis  . Anemia   . Anxiety   . Arthritis    osteoarthritis  . Asthma   . Claustrophobia    does not like oxygen mask covering face  . Depression   . Diabetes mellitus without complication (HCC)   . Fall 2016  . Fatty liver 07/2008   abd. ultrasound - fatty liver ; slt dilated cbd (no stones ) 06/10// abd.  ultrasound normal on 04/2006  . Fibromyalgia   . GERD (gastroesophageal reflux disease)    EGD negative 06/2001// EGD erythematous mucosa, polyp 08/2008  . High uric acid in 24 hour urine specimen   . Hyperhidrosis   . Hyperlipidemia   . Kidney stones   . Lymphedema   . Neuromuscular disorder (HCC)    fibromyalgia  . Obesity   . Shortness of breath dyspnea   . Tachycardia   . TMJ (dislocation of temporomandibular joint)   . UTI (lower urinary tract infection)     Past Surgical History:  Procedure  Laterality Date  . BREAST BIOPSY Right 2016   neg  . BREAST LUMPECTOMY WITH RADIOACTIVE SEED LOCALIZATION Right 08/02/2014   Procedure: BREAST LUMPECTOMY WITH RADIOACTIVE SEED LOCALIZATION;  Surgeon: Emelia Loron, MD;  Location: [facility] OR;  Service: General;  Laterality: Right;  . COLONOSCOPY    . ENDOMETRIAL BIOPSY  01/2004  . ESOPHAGOGASTRODUODENOSCOPY    . FOOT SURGERY    . SEPTOPLASTY     two times  . TONSILLECTOMY    . UTERINE FIBROID SURGERY  06/2005   ablation  . uterine tumor  09/2001  . VAGUS NERVE STIMULATOR INSERTION      Family History  Problem Relation Age of Onset  . Hypertension Father   . Cancer Father        pancreatic cancer  . Hypertension Sister   . Heart disease Maternal Grandmother 60       MI  . Diabetes Maternal Grandmother   . Heart disease Paternal Grandmother 61       MI  . Diabetes Paternal Grandmother   . Breast cancer Neg Hx     Social History Social History   Tobacco Use  . Smoking status: Never Smoker  . Smokeless tobacco: Never Used  Substance Use Topics  . Alcohol use: No    Alcohol/week: 0.0 oz  . Drug use: No    Allergies  Allergen Reactions  . Cephalexin   . Codeine     REACTION: ? reaction  . Duloxetine   . Levaquin [Levofloxacin] Nausea Only  . Lisinopril Cough  . Metformin And Related Diarrhea    GI side effects   . Prednisone     Tolerates intraarticular depo-medrol.  . Quetiapine Other (See Comments)  . Sertraline Hcl     REACTION: vivid dreams  . Simvastatin Other (See Comments)    achey  . Triazolam     REACTION: ? reaction  . Zaleplon   . Zocor [Simvastatin - High Dose]     achey  . Zolpidem Tartrate     hallucinations  . Hydrocodone Rash    REACTION: ? reaction REACTION: ? reaction  . Norelgestromin-Eth Estradiol     Patch didn't stick to skin    Current Outpatient Medications  Medication Sig Dispense Refill  . ACCU-CHEK AVIVA PLUS test strip Use to check blood sugar  once a day 100 each 0  .  ACCU-CHEK SOFTCLIX LANCETS lancets Use to check blood sugar  once a day 100 each 0  . acetaminophen (TYLENOL) 500 MG tablet Take 500 mg by mouth every 6 (six) hours as needed.     Marland Kitchen albuterol (PROVENTIL HFA;VENTOLIN HFA) 108 (90 Base) MCG/ACT inhaler Inhale 2 puffs into the lungs every 4 (four) hours as needed for wheezing. 1 Inhaler 3  . ALPRAZolam (XANAX) 1 MG tablet Take 2 mg by mouth 3 (three) times daily as needed.     . benzonatate (TESSALON) 200 MG capsule TAKE 1 CAPSULES BY MOUTH 3 TIMES DAILY AS NEEDED FOR COUGH 90 capsule 2  . Blood Glucose Calibration (ACCU-CHEK AVIVA) SOLN     . Cetirizine HCl 10 MG CAPS Take 1 capsule by mouth daily.    . cyclobenzaprine (FLEXERIL) 5 MG tablet TAKE ONE TABLET THREE TIMES A DAY IF NEEDED FOR MUSCLE SPASM 30 tablet 3  . Dextromethorphan-Guaifenesin (TUSSIN DM PO) Take by mouth daily as needed.    . DULoxetine (CYMBALTA) 20 MG capsule Take 20 mg by mouth daily.    . Ergocalciferol (VITAMIN D2 PO) Take by mouth once a week.    . fluticasone (FLONASE ALLERGY RELIEF) 50 MCG/ACT nasal spray Place into both nostrils daily.    . furosemide (LASIX) 20 MG tablet Take 1 tablet (20 mg total) by mouth daily. Prn edema 30 tablet 3  . gabapentin (NEURONTIN) 300 MG capsule TAKE 1 CAPSULE BY MOUTH 3 TIMES DAILY 90 capsule 5  . glipiZIDE (GLUCOTROL XL) 5 MG 24 hr tablet Take by mouth.    Marland Kitchen ibuprofen (ADVIL,MOTRIN) 600 MG tablet Take 1 tablet (600 mg total) by mouth every 8 (eight) hours as needed. 30 tablet 0  . loratadine (CLARITIN) 10 MG tablet Take by mouth.    . miconazole (MICOTIN) 2 % cream Apply 1 application topically 2 (two) times daily. 198 g 0  . nystatin (MYCOSTATIN/NYSTOP) powder Apply topically 2 (two) times daily. To affected areas of yeast skin infection (between skin folds) 60 g 3  . omeprazole (PRILOSEC) 20 MG capsule TAKE 1 CAPSULE BY MOUTH TWO TIMES DAILY 180 capsule 1  . oxybutynin (DITROPAN XL) 15 MG 24 hr tablet TAKE 1 TABLET BY MOUTH DAILY 90  tablet 1  . Simethicone (GAS-X PO) Take by mouth.    . spironolactone (ALDACTONE) 100 MG  tablet Take 1 tablet (100 mg total) by mouth daily. 30 tablet 3  . talc (ZEASORB) powder Apply topically as needed. (Patient taking differently: Apply 1 application topically daily as needed (sweat). ) 240 g 3  . tamsulosin (FLOMAX) 0.4 MG CAPS capsule Take 1 capsule (0.4 mg total) by mouth daily. 30 capsule 0  . traMADol (ULTRAM) 50 MG tablet Take 1 tablet (50 mg total) by mouth 2 (two) times daily as needed. 30 tablet 0  . traZODone (DESYREL) 150 MG tablet Take 450 mg by mouth at bedtime.     . TRULICITY 1.5 MG/0.5ML SOPN INJECT CONTENTS OF 1 PEN ONCE A WEEK 2 mL 3  . vitamin B-12 (CYANOCOBALAMIN) 1000 MCG tablet Take 1,000 mcg by mouth daily.     No current facility-administered medications for this visit.     Review of Systems Review of Systems  Constitutional: Negative.   Respiratory: Negative.   Cardiovascular: Negative.   Gastrointestinal: Positive for abdominal pain, diarrhea and nausea. Negative for vomiting.    Blood pressure 118/78, pulse 96, resp. rate 16, height 5\' 4"  (1.626 m), weight (!) 322 lb (146.1 kg), SpO2 97 %.  Physical Exam Physical Exam  Constitutional: She is oriented to person, place, and time. She appears well-developed and well-nourished.  HENT:  Mouth/Throat: Oropharynx is clear and moist.  Eyes: Conjunctivae are normal. No scleral icterus.  Neck: Neck supple.  Cardiovascular: Normal rate, regular rhythm and normal heart sounds.  Pulmonary/Chest: Effort normal and breath sounds normal.        No pleural friction rub.  No wheezes, rales or rhonchi.  No tenderness to palpation along the rib cage down to the costal margin.  Abdominal: Soft. Normal appearance and bowel sounds are normal. There is no tenderness.  Lymphadenopathy:    She has no cervical adenopathy.  Neurological: She is alert and oriented to person, place, and time.  Skin: Skin is warm and dry.   Psychiatric: Her behavior is normal.    Data Reviewed Abdominal ultrasound of April 18, 2017 was reviewed.  Cholelithiasis without evidence of gallbladder wall thickening, pericholecystic fluid or common bile duct abnormality.  Changes consistent with chronic fatty liver infiltration.  Liver panel dated April 22, 2017 was reviewed.  Entirely normal.  CBC dated December 21, 2016 notable for an elevated hemoglobin of 15.1 (non-smoker), MCV of 97, white blood cell count 7300, platelet count 255,000.  Colonoscopy completed August 12, 2012 was reported as entirely normal.  Indication for the study was unclear.  CT scan of the abdomen for a renal stone study dated Jun 23, 2015 was reviewed.  No pertinent findings involving the upper abdomen on this study completed without oral contrast.  Oral vasculature.  No evidence of hepatic mass.   Assessment    Cholelithiasis without clear clinical correlation to her abdominal pain or diarrhea.  Change in bowel habits with the development of frequent loose stools.  Multiple psychiatric comorbidities.  Massive obesity.    Plan    The patient's clinical history does not suggest that the gallstones are symptomatic at this time.  I have recommended that she have a HIDA scan just to determine if the gallbladder is still functioning, this will not include a ejection fraction due to the presence of stones, but if the gallbladder visualizes and in correlation with the ultrasound showing a normal gallbladder wall thickness, I think it is unlikely she will benefit from elective cholecystectomy.  Patient denies any recent medication change prior to the onset  of her diarrhea (now resolved).  I spoke with her primary care physician, Dr. Milinda Bonilla, by phone regarding evaluation plans. Schedule HIDA scan     HPI, Physical Exam, Assessment and Plan have been scribed under the direction and in the presence of Earline Mayotte, MD. Dorathy Daft, RN  I have completed  the exam and reviewed the above documentation for accuracy and completeness.  I agree with the above.  Museum/gallery conservator has been used and any errors in dictation or transcription are unintentional.  Donnalee Curry, M.D., F.A.C.S.  The patient is scheduled for a HIDA scan at [facility] on 05/09/17 at 12:30 pm. She is to arrive there by 12:00 pm and have nothing to eat or drink for 6 hours prior. The patient is aware of date, time, and instructions. Documented by Caryl-Lyn Louanna Raw LPN  Merrily Pew Kadeja Granada 05/01/2017, 11:45 AM

## 2017-05-01 ENCOUNTER — Encounter: Payer: Self-pay | Admitting: General Surgery

## 2017-05-01 DIAGNOSIS — R1011 Right upper quadrant pain: Secondary | ICD-10-CM | POA: Insufficient documentation

## 2017-05-02 ENCOUNTER — Encounter: Payer: Self-pay | Admitting: Podiatry

## 2017-05-02 ENCOUNTER — Telehealth: Payer: Self-pay | Admitting: *Deleted

## 2017-05-02 ENCOUNTER — Ambulatory Visit: Payer: Medicare Other | Admitting: Podiatry

## 2017-05-02 DIAGNOSIS — E1142 Type 2 diabetes mellitus with diabetic polyneuropathy: Secondary | ICD-10-CM

## 2017-05-02 DIAGNOSIS — M79676 Pain in unspecified toe(s): Secondary | ICD-10-CM | POA: Diagnosis not present

## 2017-05-02 DIAGNOSIS — B351 Tinea unguium: Secondary | ICD-10-CM | POA: Diagnosis not present

## 2017-05-02 NOTE — Progress Notes (Addendum)
This patient presents the office for continued evaluation of her long thick nails.  The nails are painful walking and wearing her shoes.  She has distal red skin left big toe which she wants checked to make sure there is no infection.  She presents for preventive foot care services.  GENERAL APPEARANCE: Alert, conversant. Appropriately groomed. No acute distress.  VASCULAR: Pedal pulses are  palpable at  [facility] and PT bilateral.  Capillary refill time is immediate to all digits,  Normal temperature gradient.   NEUROLOGIC: sensation is diminished  to 5.07 monofilament at 5/5 sites bilateral.  Light touch is intact bilateral, Muscle strength normal.  MUSCULOSKELETAL: acceptable muscle strength, tone and stability bilateral.  Intrinsic muscluature intact bilateral.  Rectus appearance of foot and digits noted bilateral. IPJ dorsiflexed at IPJ  Left greater than right.  DERMATOLOGIC: skin color, texture, and turgor are within normal limits.  No preulcerative lesions or ulcers  are seen, no interdigital maceration noted.  No open lesions present.  . No drainage noted. NAILS  thick disfigured discolored nails on both feet. She also has the distal aspect of the left hallux growing into the tip of her toe. No evidence of any redness, swelling or infection  Diagnosis  Onychomycosis  Diabetes with neuropathy.  ROV  Debridement of nails.    RTC 10 weeks.  Patient desires to talk with Dr.  Milinda Pointer about her gabapentin.   Gardiner Barefoot DPM

## 2017-05-02 NOTE — Telephone Encounter (Signed)
Patient called and stated that she was originally scheduled for her Nuclear Med Exam on 05/09/17 but had it rescheduled to 05/06/17. Her approved Auth was good from 04/30/17 to 06/14/17 so no need to do anything with insurance side.

## 2017-05-02 NOTE — Addendum Note (Signed)
Addended by: Graceann Congress D on: 05/02/2017 11:43 AM   Modules accepted: Orders

## 2017-05-06 ENCOUNTER — Encounter
Admission: RE | Admit: 2017-05-06 | Discharge: 2017-05-06 | Disposition: A | Discharge status: Home / Self Care | Payer: Medicare Other | Source: Ambulatory Visit | Attending: General Surgery | Admitting: General Surgery

## 2017-05-06 DIAGNOSIS — K591 Functional diarrhea: Secondary | ICD-10-CM | POA: Insufficient documentation

## 2017-05-06 DIAGNOSIS — R1011 Right upper quadrant pain: Secondary | ICD-10-CM | POA: Diagnosis present

## 2017-05-06 MED ORDER — TECHNETIUM TC 99M MEBROFENIN IV KIT
5.6000 | PACK | Freq: Once | INTRAVENOUS | Status: AC | PRN
  Administered 2017-05-06: 5.6 via INTRAVENOUS

## 2017-05-09 ENCOUNTER — Telehealth: Payer: Self-pay | Admitting: General Surgery

## 2017-05-09 DIAGNOSIS — R1084 Generalized abdominal pain: Secondary | ICD-10-CM

## 2017-05-09 DIAGNOSIS — K219 Gastro-esophageal reflux disease without esophagitis: Secondary | ICD-10-CM

## 2017-05-09 DIAGNOSIS — K582 Mixed irritable bowel syndrome: Secondary | ICD-10-CM

## 2017-05-09 NOTE — Telephone Encounter (Signed)
Patient called and would like her results from her HIDA Scan done on 05-06-17 from [facility] [place].Please call.

## 2017-05-10 ENCOUNTER — Encounter: Payer: Self-pay | Admitting: Obstetrics and Gynecology

## 2017-05-10 NOTE — Telephone Encounter (Signed)
-----   Message from Tammi Sou, [place] sent at 05/10/2017 12:00 PM EDT ----- Pt notified of Dr. Marliss Coots comments and she does agree with referral to GI, if possible she wants to see someone in [place], please put referral in and I advise pt our [facility] will call to schedule appt

## 2017-05-10 NOTE — Telephone Encounter (Signed)
Referral done Will route to [facility]  

## 2017-05-13 ENCOUNTER — Telehealth: Payer: Self-pay

## 2017-05-13 NOTE — Telephone Encounter (Signed)
Patient has been referred to [facility]

## 2017-05-14 ENCOUNTER — Ambulatory Visit: Payer: Medicare Other | Admitting: General Surgery

## 2017-05-15 ENCOUNTER — Telehealth: Payer: Self-pay

## 2017-05-15 DIAGNOSIS — Z6841 Body Mass Index (BMI) 40.0 and over, adult: Principal | ICD-10-CM

## 2017-05-15 DIAGNOSIS — E785 Hyperlipidemia, unspecified: Secondary | ICD-10-CM

## 2017-05-15 DIAGNOSIS — E1142 Type 2 diabetes mellitus with diabetic polyneuropathy: Secondary | ICD-10-CM

## 2017-05-15 NOTE — Telephone Encounter (Signed)
Spoke to pt. She is asking if [facility] and I could help her with a diet plan and help her to stay accountable. I spoke with [facility]. We think we need to consult with Dr Glori Bickers on a good diet plan for her. We do not mind keeping in touch with her to make sure she is doing what she needs to, but the diet needs to come from the physician.

## 2017-05-15 NOTE — Telephone Encounter (Signed)
Copied from [place] #[id number]. Topic: General - Other >> May 15, 2017  3:22 PM Rutherford Nail, NT wrote: Reason for CRM: Patient called wanting to [facility]. Refused to disclose what she would like to talk about. >> May 15, 2017  3:39 PM Helene Shoe, LPN wrote: I spoke with pt and pt wanted to speak with [facility] about wound care; Larene [place] had helped pt in the past and she really wants to talk with [facility].

## 2017-05-16 ENCOUNTER — Telehealth: Payer: Self-pay | Admitting: Family Medicine

## 2017-05-16 ENCOUNTER — Ambulatory Visit: Payer: Medicare Other | Admitting: Podiatry

## 2017-05-16 NOTE — Telephone Encounter (Signed)
Spoke to pt

## 2017-05-16 NOTE — Telephone Encounter (Signed)
Referral done Will route to [facility]  

## 2017-05-16 NOTE — Telephone Encounter (Signed)
Pt notified of Dr. Marliss Coots recommendations and does agree with referral to nutritionist please put referral in and I advise pt our [facility] will call to schedule an appt

## 2017-05-16 NOTE — Telephone Encounter (Signed)
This has been a challenge because of multiple food intolerances and GI problems  Happy to refer her to a nutritionist-this would be much more helpful Let me know

## 2017-05-16 NOTE — Telephone Encounter (Signed)
Copied from [place] [id number]. Topic: Quick Communication - See Telephone Encounter >> May 16, 2017  4:21 PM Robina Ade, Helene Kelp D wrote: CRM for notification. See Telephone encounter for: 05/16/17. Patient called and said that she would like a call back from [facility] and that she will know what the call is about. Pt did not was to give more information for the reason of call.

## 2017-05-21 ENCOUNTER — Ambulatory Visit (INDEPENDENT_AMBULATORY_CARE_PROVIDER_SITE_OTHER): Payer: Medicare Other | Admitting: Obstetrics and Gynecology

## 2017-05-21 ENCOUNTER — Encounter: Payer: Self-pay | Admitting: Obstetrics and Gynecology

## 2017-05-21 VITALS — BP 131/82 | HR 105 | Ht 64.0 in | Wt 326.2 lb

## 2017-05-21 DIAGNOSIS — Z0000 Encounter for general adult medical examination without abnormal findings: Secondary | ICD-10-CM | POA: Diagnosis not present

## 2017-05-21 DIAGNOSIS — L292 Pruritus vulvae: Secondary | ICD-10-CM | POA: Diagnosis not present

## 2017-05-21 DIAGNOSIS — Z7689 Persons encountering health services in other specified circumstances: Secondary | ICD-10-CM

## 2017-05-21 DIAGNOSIS — E282 Polycystic ovarian syndrome: Secondary | ICD-10-CM | POA: Diagnosis not present

## 2017-05-21 DIAGNOSIS — Z1239 Encounter for other screening for malignant neoplasm of breast: Secondary | ICD-10-CM

## 2017-05-21 DIAGNOSIS — Z9889 Other specified postprocedural states: Secondary | ICD-10-CM | POA: Diagnosis not present

## 2017-05-21 DIAGNOSIS — Z01419 Encounter for gynecological examination (general) (routine) without abnormal findings: Secondary | ICD-10-CM | POA: Diagnosis not present

## 2017-05-21 DIAGNOSIS — Z1231 Encounter for screening mammogram for malignant neoplasm of breast: Secondary | ICD-10-CM | POA: Diagnosis not present

## 2017-05-21 DIAGNOSIS — E782 Mixed hyperlipidemia: Secondary | ICD-10-CM

## 2017-05-21 NOTE — Progress Notes (Signed)
Pt is having some itching in the vaginal area also some itching in the folds of her stomach area.

## 2017-05-21 NOTE — Progress Notes (Signed)
ANNUAL PREVENTATIVE CARE GYNECOLOGY  ENCOUNTER NOTE  Subjective:       Mackenzie Bonilla is a 52 y.o. G0P0000 female here to establish care, and for a routine annual gynecologic exam. She is transitioning from [facility] in [place], [place]. The patient is not sexually active. The patient is not taking hormone replacement therapy. Patient denies post-menopausal vaginal bleeding. The patient wears seatbelts: yes. The patient participates in regular exercise: no. Has the patient ever been transfused or tattooed?: no. The patient reports that there is not domestic violence in her life.  Current complaints: 1.  Has h/o PCOS,  been of OCPs (for PCOS) x 2 months, wonders if she still needs it as she is approaching menopausal age.  Was also on Spironolactone.  Has a h/o endometrial ablation in 2005, so has not had a cycle in over 10 years.  2. Has itching in the vulvar area and in folds of stomach. Currently uses Nystatin powder in creases but wants to make sure she is not getting a yeast infection in her folds.   Gynecologic History No LMP recorded. Patient has had an ablation. Contraception: none currently.   Last Pap: Patient reports having a pap smear last year, but last one seen in Epic was from 02/2013. Results were: normal. Denies h/o abnormal pap smears.  Last mammogram: 11/2015. Results were: normal with breast ultrasound.  Patient with h/o abnormal mammogram in 2016, with radial scar, fibrocystic breast changes and had excision of right breast cyst.  Last Colonoscopy: 2014. Results were: normal.  Recommend in 10 years.     Obstetric History OB History  Gravida Para Term Preterm AB Living  0 0 0 0 0 0  SAB TAB Ectopic Multiple Live Births  0 0 0 0 0  Obstetric Comments  1st Menstrual Cycle:  12        Past Medical History:  Diagnosis Date  . Allergy    allergic rhinitis  . Anemia   . Anxiety   . Arthritis    osteoarthritis  . Asthma   . Claustrophobia    does  not like oxygen mask covering face  . Depression   . Diabetes mellitus without complication ([place])   . Fall 2016  . Fatty liver 07/2008   abd. ultrasound - fatty liver ; slt dilated cbd (no stones ) 06/10// abd.  ultrasound normal on 04/2006  . Fibromyalgia   . GERD (gastroesophageal reflux disease)    EGD negative 06/2001// EGD erythematous mucosa, polyp 08/2008  . High uric acid in 24 hour urine specimen   . Hyperhidrosis   . Hyperlipidemia   . Kidney stones   . Lymphedema   . Neuromuscular disorder ([facility])    fibromyalgia  . Obesity   . Shortness of breath dyspnea   . Tachycardia   . TMJ (dislocation of temporomandibular joint)   . UTI (lower urinary tract infection)     Family History  Problem Relation Age of Onset  . Hypertension Father   . Cancer Father        pancreatic cancer  . Hypertension Sister   . Heart disease Maternal Grandmother 60       MI  . Diabetes Maternal Grandmother   . Heart disease Paternal Grandmother 75       MI  . Diabetes Paternal Grandmother   . Breast cancer Neg Hx     Past Surgical History:  Procedure Laterality Date  . BREAST BIOPSY Right 2016   neg  .  BREAST LUMPECTOMY WITH RADIOACTIVE SEED LOCALIZATION Right 08/02/2014   Procedure: BREAST LUMPECTOMY WITH RADIOACTIVE SEED LOCALIZATION;  Surgeon: Rolm Bookbinder, MD;  Location: [facility];  Service: General;  Laterality: Right;  . COLONOSCOPY  08/12/2012   Completed by Dr. Phylis Bougie, normal exam.   . ENDOMETRIAL BIOPSY  01/2004  . ESOPHAGOGASTRODUODENOSCOPY    . FOOT SURGERY    . SEPTOPLASTY     two times  . TONSILLECTOMY    . UTERINE FIBROID SURGERY  06/2005   ablation  . uterine tumor  09/2001  . VAGUS NERVE STIMULATOR INSERTION      Social History   Socioeconomic History  . Marital status: Single    Spouse name: Not on file  . Number of children: Not on file  . Years of education: Not on file  . Highest education level: Not on file  Occupational History  . Not on file    Social Needs  . Financial resource strain: Not on file  . Food insecurity:    Worry: Not on file    Inability: Not on file  . Transportation needs:    Medical: Not on file    Non-medical: Not on file  Tobacco Use  . Smoking status: Never Smoker  . Smokeless tobacco: Never Used  Substance and Sexual Activity  . Alcohol use: No    Alcohol/week: 0.0 oz  . Drug use: No  . Sexual activity: Not Currently    Birth control/protection: None, Post-menopausal  Lifestyle  . Physical activity:    Days per week: Not on file    Minutes per session: Not on file  . Stress: Not on file  Relationships  . Social connections:    Talks on phone: Not on file    Gets together: Not on file    Attends religious service: Not on file    Active member of club or organization: Not on file    Attends meetings of clubs or organizations: Not on file    Relationship status: Not on file  . Intimate partner violence:    Fear of current or ex partner: Not on file    Emotionally abused: Not on file    Physically abused: Not on file    Forced sexual activity: Not on file  Other Topics Concern  . Not on file  Social History Narrative  . Not on file    Current Outpatient Medications on File Prior to Visit  Medication Sig Dispense Refill  . ACCU-CHEK AVIVA PLUS test strip Use to check blood sugar  once a day 100 each 0  . ACCU-CHEK SOFTCLIX LANCETS lancets Use to check blood sugar  once a day 100 each 0  . acetaminophen (TYLENOL) 500 MG tablet Take 500 mg by mouth every 6 (six) hours as needed.     Marland Kitchen albuterol (PROVENTIL HFA;VENTOLIN HFA) 108 (90 Base) MCG/ACT inhaler Inhale 2 puffs into the lungs every 4 (four) hours as needed for wheezing. 1 Inhaler 3  . ALPRAZolam (XANAX) 1 MG tablet Take 2 mg by mouth 3 (three) times daily as needed.     . benzonatate (TESSALON) 200 MG capsule TAKE 1 CAPSULES BY MOUTH 3 TIMES DAILY AS NEEDED FOR COUGH 90 capsule 2  . Blood Glucose Calibration (ACCU-CHEK AVIVA) SOLN      . cyclobenzaprine (FLEXERIL) 5 MG tablet TAKE ONE TABLET THREE TIMES A DAY IF NEEDED FOR MUSCLE SPASM 30 tablet 3  . DULoxetine (CYMBALTA) 60 MG capsule Take 60 mg by mouth daily.    Marland Kitchen  Ergocalciferol (VITAMIN D2 PO) Take by mouth once a week.    . fluticasone (FLONASE ALLERGY RELIEF) 50 MCG/ACT nasal spray Place into both nostrils daily.    . furosemide (LASIX) 20 MG tablet Take 1 tablet (20 mg total) by mouth daily. Prn edema 30 tablet 3  . gabapentin (NEURONTIN) 100 MG capsule Take 100 mg by mouth once.    . gabapentin (NEURONTIN) 300 MG capsule TAKE 1 CAPSULE BY MOUTH 3 TIMES DAILY (Patient taking differently: TAKE 1 CAPSULE BY mouth at bedtime) 90 capsule 5  . glipiZIDE (GLUCOTROL XL) 5 MG 24 hr tablet Take by mouth.    Marland Kitchen ibuprofen (ADVIL,MOTRIN) 600 MG tablet Take 1 tablet (600 mg total) by mouth every 8 (eight) hours as needed. 30 tablet 0  . loratadine (CLARITIN) 10 MG tablet Take by mouth.    . miconazole (MICOTIN) 2 % cream Apply 1 application topically 2 (two) times daily. 198 g 0  . nystatin (MYCOSTATIN/NYSTOP) powder Apply topically 2 (two) times daily. To affected areas of yeast skin infection (between skin folds) 60 g 3  . omeprazole (PRILOSEC) 20 MG capsule TAKE 1 CAPSULE BY MOUTH TWO TIMES DAILY 180 capsule 1  . oxybutynin (DITROPAN XL) 15 MG 24 hr tablet TAKE 1 TABLET BY MOUTH DAILY 90 tablet 1  . Simethicone (GAS-X PO) Take by mouth.    . spironolactone (ALDACTONE) 100 MG tablet Take 1 tablet (100 mg total) by mouth daily. 30 tablet 3  . talc (ZEASORB) powder Apply topically as needed. (Patient taking differently: Apply 1 application topically daily as needed (sweat). ) 240 g 3  . traMADol (ULTRAM) 50 MG tablet Take 1 tablet (50 mg total) by mouth 2 (two) times daily as needed. 30 tablet 0  . traZODone (DESYREL) 150 MG tablet Take 450 mg by mouth at bedtime.     . TRULICITY 1.5 BT/5.1VO SOPN INJECT CONTENTS OF 1 PEN ONCE A WEEK 2 mL 3  . Cetirizine HCl 10 MG CAPS Take 1  capsule by mouth daily.    . tamsulosin (FLOMAX) 0.4 MG CAPS capsule Take 1 capsule (0.4 mg total) by mouth daily. (Patient not taking: Reported on 05/21/2017) 30 capsule 0  . vitamin B-12 (CYANOCOBALAMIN) 1000 MCG tablet Take 1,000 mcg by mouth daily.     No current facility-administered medications on file prior to visit.     Allergies  Allergen Reactions  . Cephalexin   . Codeine     REACTION: ? reaction  . Duloxetine   . Levaquin [Levofloxacin] Nausea Only  . Lisinopril Cough  . Metformin And Related Diarrhea    GI side effects   . Prednisone     Tolerates intraarticular depo-medrol.  . Quetiapine Other (See Comments)  . Sertraline Hcl     REACTION: vivid dreams  . Simvastatin Other (See Comments)    achey  . Triazolam     REACTION: ? reaction  . Zaleplon   . Zocor [Simvastatin - High Dose]     achey  . Zolpidem Tartrate     hallucinations  . Hydrocodone Rash    REACTION: ? reaction REACTION: ? reaction  . Norelgestromin-Eth Estradiol     Patch didn't stick to skin      Review of Systems ROS Review of Systems - General ROS: negative for - chills, fatigue, fever, hot flashes, night sweats, weight gain or weight loss Psychological ROS: negative for - anxiety, decreased libido, depression, mood swings, physical abuse or sexual abuse Ophthalmic ROS: negative for -  blurry vision, eye pain or loss of vision ENT ROS: negative for - headaches, hearing change, visual changes or vocal changes Allergy and Immunology ROS: negative for - hives, itchy/watery eyes or seasonal allergies Hematological and Lymphatic ROS: negative for - bleeding problems, bruising, swollen lymph nodes or weight loss Endocrine ROS: negative for - galactorrhea, hair pattern changes, hot flashes, malaise/lethargy, mood swings, palpitations, polydipsia/polyuria, skin changes, temperature intolerance or unexpected weight changes Breast ROS: negative for - new or changing breast lumps or nipple  discharge Respiratory ROS: negative for - cough or shortness of breath Cardiovascular ROS: negative for - chest pain, irregular heartbeat, palpitations or shortness of breath Gastrointestinal ROS: no abdominal pain, change in bowel habits, or black or bloody stools Genito-Urinary ROS: no dysuria, trouble voiding, or hematuria Musculoskeletal ROS: negative for - joint pain or joint stiffness Neurological ROS: negative for - bowel and bladder control changes Dermatological ROS: negative for rash and skin lesion changes. Positive for itching in vulvar and stomach folds.    Objective:   BP 131/82   Pulse (!) 105   Ht 5\' 4"  (1.626 m)   Wt (!) 326 lb 3.2 oz (148 kg)   BMI 55.99 kg/m  CONSTITUTIONAL: Well-developed, well-nourished female in no acute distress. Morbid obesity  PSYCHIATRIC: Normal mood and affect. Normal behavior. Normal judgment and thought content. [place]: Alert and oriented to person, place, and time. Normal muscle tone coordination. No cranial nerve deficit noted. HENT:  Normocephalic, atraumatic, External right and left ear normal. Oropharynx is clear and moist EYES: Conjunctivae and EOM are normal. Pupils are equal, round, and reactive to light. No scleral icterus.  NECK: Normal range of motion, supple, no masses.  Normal thyroid.  SKIN: Skin is warm and dry. No rash noted. Not diaphoretic. No erythema. No pallor. CARDIOVASCULAR: Normal heart rate noted, regular rhythm, no murmur. RESPIRATORY: Clear to auscultation bilaterally. Effort and breath sounds normal, no problems with respiration noted. BREASTS: Symmetric in size. No masses, skin changes, nipple drainage, or lymphadenopathy. ABDOMEN: Soft, normal bowel sounds, no distention noted.  No tenderness, rebound or guarding.  BLADDER: Normal PELVIC:  Bladder no bladder distension noted  Urethra: normal appearing urethra with no masses, tenderness or lesions  Vulva: normal appearing vulva with no masses, tenderness or  lesions  Vagina: normal appearing vagina with normal color and discharge, no lesions  Cervix: normal appearing cervix without discharge or lesions  Uterus: difficult to palpate due to morbid obesity but no enlargement or masses noted.   Adnexa: normal adnexa in size, nontender and no masses  RV: External Exam NormaI, No Rectal Masses and Normal Sphincter tone  MUSCULOSKELETAL: Normal range of motion. No tenderness.  No cyanosis, clubbing, or edema.  2+ distal pulses. LYMPHATIC: No Axillary, Supraclavicular, or Inguinal Adenopathy.   Labs: Lab Results  Component Value Date   WBC 7.3 12/21/2016   HGB 15.1 (H) 12/21/2016   HCT 46.4 (H) 12/21/2016   MCV 97.3 12/21/2016   PLT 255.0 12/21/2016    Lab Results  Component Value Date   CREATININE 1.12 09/05/2016   BUN 14 09/05/2016   NA 140 09/05/2016   K 4.2 09/05/2016   CL 104 09/05/2016   CO2 27 09/05/2016    Lab Results  Component Value Date   ALT 12 04/22/2017   AST 11 04/22/2017   ALKPHOS 58 04/22/2017   BILITOT 0.3 04/22/2017    Lab Results  Component Value Date   CHOL 192 11/05/2014   HDL 43.30 11/05/2014  LDLCALC 44 06/17/2009   LDLDIRECT 112.0 11/05/2014   TRIG 305.0 (H) 11/05/2014   CHOLHDL 4 11/05/2014    Lab Results  Component Value Date   TSH 2.63 09/05/2016    Lab Results  Component Value Date   HGBA1C 7.0 04/17/2017     Assessment:   Annual gynecologic examination 52 y.o. H/o PCOS H/o endometrial ablation Morbid Obesity, BMI > 55  Dyslipidemia (elevated triglycerides) Vulvar itching  Plan:  Pap: Patient reports having a pap smear done last year, but last pap in EPIC was from 2018. Will request medical records to confirm, and if pap needed, will inform patient to return.  Mammogram: Ordered.  Discussed self-breast exam techniques.  Stool Guaiac Testing:  Not Indicated.  Colonoscopy is up to date.  Labs: Testosterone, FSH/LH, Estradiol and Progesterone, CBC, CMP, Lipid 1 and TSH Routine  preventative health maintenance measures emphasized: Exercise/Diet/Weight control and Stress Management Vulvar itching noted, but no rashes, lesions in groin, vulva, or stomach area noted. Advised to continue use of Nystatin powder as directed. Can use Coconut oil or Vitamin E oil on areas of itching.  Will check hormone levels to asses hormonal status (menopausal or perimenopause) to see if OCPs still necessary for management of PCOS. Based on results, will prescribe appropriate medication management.  Medical records release form to be signed at check out.   Return to [place], [place]

## 2017-05-22 ENCOUNTER — Encounter: Payer: Self-pay | Admitting: Obstetrics and Gynecology

## 2017-05-22 LAB — COMPREHENSIVE METABOLIC PANEL
ALT: 24 IU/L (ref 0–32)
AST: 18 IU/L (ref 0–40)
Albumin/Globulin Ratio: 2 (ref 1.2–2.2)
Albumin: 4.5 g/dL (ref 3.5–5.5)
Alkaline Phosphatase: 79 IU/L (ref 39–117)
BUN/Creatinine Ratio: 10 (ref 9–23)
BUN: 10 mg/dL (ref 6–24)
CHLORIDE: 100 mmol/L (ref 96–106)
CO2: 26 mmol/L (ref 20–29)
CREATININE: 1.04 mg/dL — AB (ref 0.57–1.00)
Calcium: 10.3 mg/dL — ABNORMAL HIGH (ref 8.7–10.2)
GFR calc Af Amer: 72 mL/min/{1.73_m2} (ref >59)
GFR calc non Af Amer: 62 mL/min/{1.73_m2} (ref >59)
GLUCOSE: 154 mg/dL — AB (ref 65–99)
Globulin, Total: 2.2 g/dL (ref 1.5–4.5)
Potassium: 5 mmol/L (ref 3.5–5.2)
Sodium: 143 mmol/L (ref 134–144)
Total Protein: 6.7 g/dL (ref 6.0–8.5)

## 2017-05-22 LAB — TESTOSTERONE, FREE, TOTAL, SHBG
Sex Hormone Binding: 55.9 nmol/L (ref 17.3–125.0)
Testosterone, Free: 2.1 pg/mL (ref 0.0–4.2)
Testosterone: 12 ng/dL (ref 3–41)

## 2017-05-22 LAB — LIPID PANEL
Chol/HDL Ratio: 6.2 ratio — ABNORMAL HIGH (ref 0.0–4.4)
Cholesterol, Total: 247 mg/dL — ABNORMAL HIGH (ref 100–199)
HDL: 40 mg/dL (ref >39)
LDL Calculated: 153 mg/dL — ABNORMAL HIGH (ref 0–99)
Triglycerides: 269 mg/dL — ABNORMAL HIGH (ref 0–149)
VLDL Cholesterol Cal: 54 mg/dL — ABNORMAL HIGH (ref 5–40)

## 2017-05-22 LAB — PROGESTERONE: Progesterone: 0.2 ng/mL

## 2017-05-22 LAB — CBC
Hematocrit: 44 % (ref 34.0–46.6)
Hemoglobin: 14.8 g/dL (ref 11.1–15.9)
MCH: 31.4 pg (ref 26.6–33.0)
MCHC: 33.6 g/dL (ref 31.5–35.7)
MCV: 93 fL (ref 79–97)
PLATELETS: 254 10*3/uL (ref 150–379)
RBC: 4.72 x10E6/uL (ref 3.77–5.28)
RDW: 14 % (ref 12.3–15.4)
WBC: 8.5 10*3/uL (ref 3.4–10.8)

## 2017-05-22 LAB — FSH/LH
FSH: 89.5 m[IU]/mL
LH: 59.2 m[IU]/mL

## 2017-05-22 LAB — ESTRADIOL: Estradiol: 11 pg/mL

## 2017-05-23 ENCOUNTER — Telehealth: Payer: Self-pay

## 2017-05-23 DIAGNOSIS — F509 Eating disorder, unspecified: Secondary | ICD-10-CM

## 2017-05-23 DIAGNOSIS — Z6841 Body Mass Index (BMI) 40.0 and over, adult: Secondary | ICD-10-CM

## 2017-05-23 DIAGNOSIS — E119 Type 2 diabetes mellitus without complications: Secondary | ICD-10-CM

## 2017-05-23 NOTE — Telephone Encounter (Signed)
Copied from [place] [id number]. Topic: Referral - Request >> May 23, 2017  9:55 AM Yvette Rack wrote: Reason for CRM: patient states that [facility] [place] to see [facility] phone number [phone] and that the referral has to have codes Diabetes and eating disorder

## 2017-05-23 NOTE — Telephone Encounter (Signed)
I re did the referral to be more specific   Will route to [facility]

## 2017-05-23 NOTE — Telephone Encounter (Signed)
Pt last seen 04/05/17.Please advise.

## 2017-05-23 NOTE — Telephone Encounter (Signed)
Called [facility], spoke with Pamala Hurry. They have the Referral and know who the patient wants to be referred to. They will call the patient to schedule.

## 2017-06-07 ENCOUNTER — Other Ambulatory Visit: Payer: Self-pay | Admitting: Podiatry

## 2017-06-10 ENCOUNTER — Ambulatory Visit
Admission: RE | Admit: 2017-06-10 | Discharge: 2017-06-10 | Disposition: A | Discharge status: Home / Self Care | Payer: Medicare Other | Source: Ambulatory Visit | Attending: Obstetrics and Gynecology | Admitting: Obstetrics and Gynecology

## 2017-06-10 DIAGNOSIS — Z1231 Encounter for screening mammogram for malignant neoplasm of breast: Secondary | ICD-10-CM | POA: Insufficient documentation

## 2017-06-10 DIAGNOSIS — Z1239 Encounter for other screening for malignant neoplasm of breast: Secondary | ICD-10-CM

## 2017-06-11 ENCOUNTER — Telehealth: Payer: Self-pay | Admitting: Podiatry

## 2017-06-11 ENCOUNTER — Ambulatory Visit: Payer: Self-pay | Admitting: Gastroenterology

## 2017-06-11 ENCOUNTER — Other Ambulatory Visit: Payer: Self-pay | Admitting: Family Medicine

## 2017-06-11 ENCOUNTER — Other Ambulatory Visit: Payer: Self-pay

## 2017-06-11 ENCOUNTER — Other Ambulatory Visit
Admission: RE | Admit: 2017-06-11 | Discharge: 2017-06-11 | Disposition: A | Discharge status: Home / Self Care | Payer: Medicare Other | Source: Ambulatory Visit | Attending: Gastroenterology | Admitting: Gastroenterology

## 2017-06-11 ENCOUNTER — Ambulatory Visit (INDEPENDENT_AMBULATORY_CARE_PROVIDER_SITE_OTHER): Payer: Medicare Other | Admitting: Gastroenterology

## 2017-06-11 ENCOUNTER — Encounter: Payer: Self-pay | Admitting: Gastroenterology

## 2017-06-11 VITALS — BP 131/82 | HR 102 | Ht 64.0 in | Wt 332.0 lb

## 2017-06-11 DIAGNOSIS — K582 Mixed irritable bowel syndrome: Secondary | ICD-10-CM | POA: Insufficient documentation

## 2017-06-11 NOTE — Telephone Encounter (Signed)
Patient stopped by the office to check on her refill request for Gabepentin on 06/07/17 from Dr. Milinda Pointer. Patient states the RX needs to be updated for 100 mlg capsules 2 in the am, and then 3 in the pm. Patient states that the 300 mlg tablets make her to sleepy during the day. Pt would like nurse call about this before refill is sent in to her pharmacy. She has a doctors appt this morning and would like a call back after lunch. Pharmacy has been asking for a call back on this as well and no one has returned the call.

## 2017-06-11 NOTE — Telephone Encounter (Signed)
Okay to send new Rx in for gabapentin

## 2017-06-11 NOTE — Progress Notes (Signed)
Cephas Darby, MD [address]  [place]  [place],  [address]  Main: [phone]  Fax: [phone]    Gastroenterology Consultation  Referring Provider:     Abner Greenspan, MD Primary Care Physician:  Tower, Wynelle Fanny, MD Primary Gastroenterologist:  Dr. Cephas Darby Reason for Consultation:     Irritable bowel syndrome        HPI:   Mackenzie Bonilla is a 52 y.o. female referred by Dr. Glori Bickers, Wynelle Fanny, MD  for consultation & management of irritable bowel syndrome. Patient is morbidly obese female with metabolic syndrome, multiple psychiatric conditions has been suffering from chronic gastrointestinal symptoms including alternating episodes of diarrhea and constipation, nonbloody stools, abdominal bloating and cramps, nausea, heartburn. She is working with her psychiatric is closely with different medications and cognitive behavioral therapy. She is currently on omeprazole 20 mg twice daily for heartburn and nausea. She does consume fried foods, snacks, junk foods and red meat. She also has history of eating disorder, bulimia nervosa. She had stool studies in the past negative for infection. Unclear if patient was ever tested for celiac disease or H. Pylori infection. Her TSH is normal  NSAIDs: none  Antiplts/Anticoagulants/Anti thrombotics: none  GI Procedures:  She denies family history of GI malignancy Had a colonoscopy in 08/2012, normal exam and 08/2008, polyp was removed EGD 10/2001 normal  Past Medical History:  Diagnosis Date  . Allergy    allergic rhinitis  . Anemia   . Anxiety   . Arthritis    osteoarthritis  . Asthma   . Claustrophobia    does not like oxygen mask covering face  . Depression   . Diabetes mellitus without complication ([place])   . Fall 2016  . Fatty liver 07/2008   abd. ultrasound - fatty liver ; slt dilated cbd (no stones ) 06/10// abd.  ultrasound normal on 04/2006  . Fibromyalgia   . GERD (gastroesophageal reflux disease)    EGD negative 06/2001// EGD erythematous mucosa, polyp 08/2008  . High uric acid in 24 hour urine specimen   . Hyperhidrosis   . Hyperlipidemia   . Kidney stones   . Lymphedema   . Neuromuscular disorder ([facility])    fibromyalgia  . Obesity   . Shortness of breath dyspnea   . Tachycardia   . TMJ (dislocation of temporomandibular joint)   . UTI (lower urinary tract infection)     Past Surgical History:  Procedure Laterality Date  . BREAST BIOPSY Right 2016   neg  . BREAST LUMPECTOMY WITH RADIOACTIVE SEED LOCALIZATION Right 08/02/2014   Procedure: BREAST LUMPECTOMY WITH RADIOACTIVE SEED LOCALIZATION;  Surgeon: Rolm Bookbinder, MD;  Location: [facility];  Service: General;  Laterality: Right;  . COLONOSCOPY  08/12/2012   Completed by Dr. Phylis Bougie, normal exam.   . ENDOMETRIAL BIOPSY  01/2004  . ESOPHAGOGASTRODUODENOSCOPY    . FOOT SURGERY    . SEPTOPLASTY     two times  . TONSILLECTOMY    . UTERINE FIBROID SURGERY  06/2005   ablation  . uterine tumor  09/2001  . VAGUS NERVE STIMULATOR INSERTION      Current Outpatient Medications:  .  ACCU-CHEK AVIVA PLUS test strip, Use to check blood sugar  once a day, Disp: 100 each, Rfl: 0 .  ACCU-CHEK SOFTCLIX LANCETS lancets, Use to check blood sugar  once a day, Disp: 100 each, Rfl: 0 .  acetaminophen (TYLENOL) 500 MG tablet, Take 500 mg by mouth every 6 (  six) hours as needed. , Disp: , Rfl:  .  buPROPion (WELLBUTRIN XL) 150 MG 24 hr tablet, , Disp: , Rfl:  .  Cetirizine HCl 10 MG CAPS, Take 1 capsule by mouth daily., Disp: , Rfl:  .  DULoxetine (CYMBALTA) 60 MG capsule, Take 60 mg by mouth daily., Disp: , Rfl:  .  Ergocalciferol (VITAMIN D2 PO), Take by mouth once a week., Disp: , Rfl:  .  fluticasone (FLONASE ALLERGY RELIEF) 50 MCG/ACT nasal spray, Place into both nostrils daily., Disp: , Rfl:  .  gabapentin (NEURONTIN) 100 MG capsule, Take 100 mg by mouth once., Disp: , Rfl:  .  gabapentin (NEURONTIN) 300 MG capsule, TAKE 1 CAPSULE BY MOUTH 3  TIMES DAILY (Patient taking differently: TAKE 1 CAPSULE BY mouth at bedtime), Disp: 90 capsule, Rfl: 5 .  ibuprofen (ADVIL,MOTRIN) 600 MG tablet, Take 1 tablet (600 mg total) by mouth every 8 (eight) hours as needed., Disp: 30 tablet, Rfl: 0 .  loratadine (CLARITIN) 10 MG tablet, Take by mouth., Disp: , Rfl:  .  losartan (COZAAR) 25 MG tablet, , Disp: , Rfl:  .  miconazole (MICOTIN) 2 % cream, Apply 1 application topically 2 (two) times daily., Disp: 198 g, Rfl: 0 .  nystatin (MYCOSTATIN/NYSTOP) powder, Apply topically 2 (two) times daily. To affected areas of yeast skin infection (between skin folds), Disp: 60 g, Rfl: 3 .  omeprazole (PRILOSEC) 20 MG capsule, TAKE 1 CAPSULE BY MOUTH TWO TIMES DAILY, Disp: 180 capsule, Rfl: 1 .  oxybutynin (DITROPAN XL) 15 MG 24 hr tablet, TAKE 1 TABLET BY MOUTH DAILY, Disp: 90 tablet, Rfl: 1 .  pravastatin (PRAVACHOL) 20 MG tablet, , Disp: , Rfl:  .  Simethicone (GAS-X PO), Take by mouth., Disp: , Rfl:  .  spironolactone (ALDACTONE) 100 MG tablet, Take 1 tablet (100 mg total) by mouth daily., Disp: 30 tablet, Rfl: 3 .  traMADol (ULTRAM) 50 MG tablet, Take 1 tablet (50 mg total) by mouth 2 (two) times daily as needed., Disp: 30 tablet, Rfl: 0 .  traZODone (DESYREL) 150 MG tablet, Take 450 mg by mouth at bedtime. , Disp: , Rfl:  .  TRULICITY 1.5 TK/1.6WF SOPN, INJECT CONTENTS OF 1 PEN ONCE A WEEK, Disp: 2 mL, Rfl: 3 .  vitamin B-12 (CYANOCOBALAMIN) 1000 MCG tablet, Take 1,000 mcg by mouth daily., Disp: , Rfl:  .  albuterol (PROVENTIL HFA;VENTOLIN HFA) 108 (90 Base) MCG/ACT inhaler, Inhale 2 puffs into the lungs every 4 (four) hours as needed for wheezing., Disp: 1 Inhaler, Rfl: 3 .  ALPRAZolam (XANAX) 1 MG tablet, Take 2 mg by mouth 3 (three) times daily as needed. , Disp: , Rfl:  .  benzonatate (TESSALON) 200 MG capsule, TAKE 1 CAPSULES BY MOUTH 3 TIMES DAILY AS NEEDED FOR COUGH, Disp: 90 capsule, Rfl: 2 .  Blood Glucose Calibration (ACCU-CHEK AVIVA) SOLN, , Disp:  , Rfl:  .  cyclobenzaprine (FLEXERIL) 5 MG tablet, TAKE ONE TABLET THREE TIMES A DAY IF NEEDED FOR MUSCLE SPASM, Disp: 30 tablet, Rfl: 3 .  furosemide (LASIX) 20 MG tablet, Take 1 tablet (20 mg total) by mouth daily. Prn edema (Patient not taking: Reported on 06/11/2017), Disp: 30 tablet, Rfl: 3 .  talc (ZEASORB) powder, Apply topically as needed. (Patient taking differently: Apply 1 application topically daily as needed (sweat). ), Disp: 240 g, Rfl: 3 .  tamsulosin (FLOMAX) 0.4 MG CAPS capsule, Take 1 capsule (0.4 mg total) by mouth daily. (Patient not taking: Reported on 05/21/2017),  Disp: 30 capsule, Rfl: 0   Family History  Problem Relation Age of Onset  . Hypertension Father   . Cancer Father        pancreatic cancer  . Hypertension Sister   . Heart disease Maternal Grandmother 60       MI  . Diabetes Maternal Grandmother   . Heart disease Paternal Grandmother 66       MI  . Diabetes Paternal Grandmother   . Breast cancer Neg Hx      Social History   Tobacco Use  . Smoking status: Never Smoker  . Smokeless tobacco: Never Used  Substance Use Topics  . Alcohol use: No    Alcohol/week: 0.0 oz  . Drug use: No    Allergies as of 06/11/2017 - Review Complete 06/11/2017  Allergen Reaction Noted  . Cephalexin  10/16/2006  . Codeine  10/16/2006  . Duloxetine  10/16/2006  . Levaquin [levofloxacin] Nausea Only 12/25/2011  . Lisinopril Cough 01/16/2017  . Metformin and related Diarrhea 11/14/2011  . Prednisone  10/16/2006  . Quetiapine Other (See Comments) 10/16/2006  . Sertraline hcl    . Simvastatin Other (See Comments) 07/05/2010  . Triazolam  10/16/2006  . Zaleplon  10/16/2006  . Zocor [simvastatin - high dose]  07/05/2010  . Zolpidem tartrate  10/16/2006  . Hydrocodone Rash 10/16/2006  . Norelgestromin-eth estradiol  10/16/2006    Review of Systems:    All systems reviewed and negative except where noted in HPI.   Physical Exam:  BP 131/82   Pulse (!) 102   Ht 5'  4" (1.626 m)   Wt (!) 332 lb (150.6 kg)   BMI 56.99 kg/m  No LMP recorded. Patient has had an ablation.  General:   Alert,  Well-developed, well-nourished, pleasant and cooperative in NAD Head:  Normocephalic and atraumatic, double Chin, short neck. Eyes:  Sclera clear, no icterus.   Conjunctiva pink. Ears:  Normal auditory acuity. Nose:  No deformity, discharge, or lesions. Mouth:  No deformity or lesions,oropharynx pink & moist. Neck:  Supple; no masses or thyromegaly. Lungs:  Respirations even and unlabored.  Clear throughout to auscultation.   No wheezes, crackles, or rhonchi. No acute distress. Heart:  Regular rate and rhythm; no murmurs, clicks, rubs, or gallops. Abdomen:  Normal bowel sounds. Soft, severe obesity, non-tender and non-distended, limited abdominal exam due to her body habitus  No guarding or rebound tenderness.   Rectal: Not performed Msk:  Symmetrical without gross deformities. Good, equal movement & strength bilaterally. Pulses:  Normal pulses noted. Extremities:  No clubbing or edema.  No cyanosis. Neurologic:  Alert and oriented x3;  grossly normal neurologically. Skin:  Dry scaly skin in her upper extremities No jaundice. Psych:  Alert and cooperative. Normal mood and affect.  Imaging Studies: reviewed  Assessment and Plan:   ZAKHIA SERES is a 52 y.o. Caucasian female with metabolic syndrome, BMI 56, fatty liver disease, multiple underlying psychiatric illnesses with chronic symptoms of alternating episodes of diarrhea constipation, abdominal bloating, cramps, heartburn and globus sensation. Her symptoms are highly suggestive of functional GI disorder, irritable bowel syndrome-mixed type. Discussed in length with her about management of IBS including combination of dietary modification, medications, managing her underlying psychiatric illnesses, cognitive behavioral therapy and importantly weight loss.  - will try IB guard and probiotics - I will try  antibiotics if her symptoms persist - H. Pylori breath test, serum TTG and IgA - provided her with information about [facility]  at [facility] - screening colonoscopy due in 2024   Follow up in 4 weeks   Cephas Darby, MD

## 2017-06-11 NOTE — Patient Instructions (Signed)
Irritable Bowel Syndrome, Adult Irritable bowel syndrome (IBS) is not one specific disease. It is a group of symptoms that affects the organs responsible for digestion (gastrointestinal or GI tract). To regulate how your GI tract works, your body sends signals back and forth between your intestines and your brain. If you have IBS, there may be a problem with these signals. As a result, your GI tract does not function normally. Your intestines may become more sensitive and overreact to certain things. This is especially true when you eat certain foods or when you are under stress. There are four types of IBS. These may be determined based on the consistency of your stool:  IBS with diarrhea.  IBS with constipation.  Mixed IBS.  Unsubtyped IBS.  It is important to know which type of IBS you have. Some treatments are more likely to be helpful for certain types of IBS. What are the causes? The exact cause of IBS is not known. What increases the risk? You may have a higher risk of IBS if:  You are a woman.  You are younger than 52 years old.  You have a family history of IBS.  You have mental health problems.  You have had bacterial infection of your GI tract.  What are the signs or symptoms? Symptoms of IBS vary from person to person. The main symptom is abdominal pain or discomfort. Additional symptoms usually include one or more of the following:  Diarrhea, constipation, or both.  Abdominal swelling or bloating.  Feeling full or sick after eating a small or regular-size meal.  Frequent gas.  Mucus in the stool.  A feeling of having more stool left after a bowel movement.  Symptoms tend to come and go. They may be associated with stress, psychiatric conditions, or nothing at all. How is this diagnosed? There is no specific test to diagnose IBS. Your health care provider will make a diagnosis based on a physical exam, medical history, and your symptoms. You may have other  tests to rule out other conditions that may be causing your symptoms. These may include:  Blood tests.  X-rays.  CT scan.  Endoscopy and colonoscopy. This is a test in which your GI tract is viewed with a long, thin, flexible tube.  How is this treated? There is no cure for IBS, but treatment can help relieve symptoms. IBS treatment often includes:  Changes to your diet, such as: ? Eating more fiber. ? Avoiding foods that cause symptoms. ? Drinking more water. ? Eating regular, medium-sized portioned meals.  Medicines. These may include: ? Fiber supplements if you have constipation. ? Medicine to control diarrhea (antidiarrheal medicines). ? Medicine to help control muscle spasms in your GI tract (antispasmodic medicines). ? Medicines to help with any mental health issues, such as antidepressants or tranquilizers.  Therapy. ? Talk therapy may help with anxiety, depression, or other mental health issues that can make IBS symptoms worse.  Stress reduction. ? Managing your stress can help keep symptoms under control.  Follow these instructions at home:  Take medicines only as directed by your health care provider.  Eat a healthy diet. ? Avoid foods and drinks with added sugar. ? Include more whole grains, fruits, and vegetables gradually into your diet. This may be especially helpful if you have IBS with constipation. ? Avoid any foods and drinks that make your symptoms worse. These may include dairy products and caffeinated or carbonated drinks. ? Do not eat large meals. ? Drink enough   fluid to keep your urine clear or pale yellow.  Exercise regularly. Ask your health care provider for recommendations of good activities for you.  Keep all follow-up visits as directed by your health care provider. This is important. Contact a health care provider if:  You have constant pain.  You have trouble or pain with swallowing.  You have worsening diarrhea. Get help right away  if:  You have severe and worsening abdominal pain.  You have diarrhea and: ? You have a rash, stiff neck, or severe headache. ? You are irritable, sleepy, or difficult to awaken. ? You are weak, dizzy, or extremely thirsty.  You have bright red blood in your stool or you have black tarry stools.  You have unusual abdominal swelling that is painful.  You vomit continuously.  You vomit blood (hematemesis).  You have both abdominal pain and a fever. This information is not intended to replace advice given to you by your health care provider. Make sure you discuss any questions you have with your health care provider. Document Released: 01/29/2005 Document Revised: 07/01/2015 Document Reviewed: 10/16/2013 Elsevier Interactive Patient Education  2018 Elsevier Inc. Diet for Irritable Bowel Syndrome When you have irritable bowel syndrome (IBS), the foods you eat and your eating habits are very important. IBS may cause various symptoms, such as abdominal pain, constipation, or diarrhea. Choosing the right foods can help ease discomfort caused by these symptoms. Work with your health care provider and dietitian to find the best eating plan to help control your symptoms. What general guidelines do I need to follow?  Keep a food diary. This will help you identify foods that cause symptoms. Write down: ? What you eat and when. ? What symptoms you have. ? When symptoms occur in relation to your meals.  Avoid foods that cause symptoms. Talk with your dietitian about other ways to get the same nutrients that are in these foods.  Eat more foods that contain fiber. Take a fiber supplement if directed by your dietitian.  Eat your meals slowly, in a relaxed setting.  Aim to eat 5-6 small meals per day. Do not skip meals.  Drink enough fluids to keep your urine clear or pale yellow.  Ask your health care provider if you should take an over-the-counter probiotic during flare-ups to help restore  healthy gut bacteria.  If you have cramping or diarrhea, try making your meals low in fat and high in carbohydrates. Examples of carbohydrates are pasta, rice, whole grain breads and cereals, fruits, and vegetables.  If dairy products cause your symptoms to flare up, try eating less of them. You might be able to handle yogurt better than other dairy products because it contains bacteria that help with digestion. What foods are not recommended? The following are some foods and drinks that may worsen your symptoms:  Fatty foods, such as French fries.  Milk products, such as cheese or ice cream.  Chocolate.  Alcohol.  Products with caffeine, such as coffee.  Carbonated drinks, such as soda.  The items listed above may not be a complete list of foods and beverages to avoid. Contact your dietitian for more information. What foods are good sources of fiber? Your health care provider or dietitian may recommend that you eat more foods that contain fiber. Fiber can help reduce constipation and other IBS symptoms. Add foods with fiber to your diet a little at a time so that your body can get used to them. Too much fiber at once   might cause gas and swelling of your abdomen. The following are some foods that are good sources of fiber:  Apples.  Peaches.  Pears.  Berries.  Figs.  Broccoli (raw).  Cabbage.  Carrots.  Raw peas.  Kidney beans.  Lima beans.  Whole grain bread.  Whole grain cereal.  Where to find more information: International Foundation for Functional Gastrointestinal Disorders: www.iffgd.org National Institute of Diabetes and Digestive and Kidney Diseases: www.niddk.nih.gov/health-information/health-topics/digestive-diseases/ibs/Pages/facts.aspx This information is not intended to replace advice given to you by your health care provider. Make sure you discuss any questions you have with your health care provider. Document Released: 04/21/2003 Document Revised:  07/07/2015 Document Reviewed: 05/01/2013 Elsevier Interactive Patient Education  2018 Elsevier Inc.  

## 2017-06-12 ENCOUNTER — Encounter: Payer: Medicare Other | Attending: Family Medicine | Admitting: *Deleted

## 2017-06-12 ENCOUNTER — Telehealth: Payer: Self-pay | Admitting: *Deleted

## 2017-06-12 ENCOUNTER — Telehealth: Payer: Self-pay | Admitting: Gastroenterology

## 2017-06-12 DIAGNOSIS — F509 Eating disorder, unspecified: Secondary | ICD-10-CM

## 2017-06-12 DIAGNOSIS — Z713 Dietary counseling and surveillance: Secondary | ICD-10-CM | POA: Diagnosis present

## 2017-06-12 DIAGNOSIS — Z6841 Body Mass Index (BMI) 40.0 and over, adult: Secondary | ICD-10-CM | POA: Insufficient documentation

## 2017-06-12 DIAGNOSIS — E119 Type 2 diabetes mellitus without complications: Secondary | ICD-10-CM | POA: Diagnosis not present

## 2017-06-12 LAB — IGA: IgA: 152 mg/dL (ref 87–352)

## 2017-06-12 LAB — TISSUE TRANSGLUTAMINASE, IGA: Tissue Transglutaminase Ab, IgA: <2 U/mL (ref 0–3)

## 2017-06-12 LAB — H. PYLORI BREATH TEST: H. pylori UBiT: NEGATIVE

## 2017-06-12 MED ORDER — GABAPENTIN 100 MG PO CAPS: ORAL_CAPSULE | ORAL | 3 refills | Status: DC

## 2017-06-12 NOTE — Telephone Encounter (Signed)
Pt states she needs a new rx for Gabapentin 200mg  in the morning and Gabapentin 300mg  in the evening.

## 2017-06-12 NOTE — Telephone Encounter (Signed)
LVM informing: Patient has been informed that IB Guard is available over the counter to purchase.  No prescription needed, and we could provide her coupons to help with the price.

## 2017-06-12 NOTE — Addendum Note (Signed)
Addended by: Clovis Riley E on: 06/12/2017 01:45 PM   Modules accepted: Orders

## 2017-06-12 NOTE — Progress Notes (Signed)
Appointment start time: 1500  Appointment end time: 1600  Patient was seen on 06/12/17 for nutrition counseling pertaining to disordered eating  Primary care provider: Dr. Glori Bickers Therapist: chuck burnett   Assessment History provided by patient is disjointed   Started seeing psychologist and has 3 visits. Thinks he will help her with her gut He claims to specialize in weight loss and eating disorders  hsitory of [facility] States now that she gets in a trance like state and eats, but isn't aware of it Has gall stones, but isn't able to get surgery so was instructed to follow a diet.  History of kidney stones and was advised to follow another diet.  Also has diabetes  Thinks she is addicted to sugar.  States she rarely gets full If she is upset or thinks she won't have more food the next day she will keep eating.  Thinks she has no self control  Thinks about food all the time.  Usually skips breakfast and so she is really hungry by lunch so she eats more.  Then lunch gets pushed back.  Doesn't want to eat past 6 pm and that is hard.  Is on multiple medications that depend on her eating.    Has acid reflux  Was referred to me 2 years ago, but she fell before she could get scheduled.  Has IBS and that affects her quality of life because she feels like she can't go anywhere Went through menopause last year.  Has PCOS  Lost 19 lb when she was in the rehab center  Has a lot of conflicting information about how to eat Dizzy almost daily.  Doesn't check her glucose hardly. A1C is 7% per patient    Dietary assessment: A typical day consists of 2-3 meals and several snacks  Safe foods include: fast food- taco bell, fried shrimp Avoided foods include:many  24 hour recall:  B:  none L: [employer] grand [place] club without lettuce D: [employer] 2 gyros without lettuce Beverages: tea and water  Nutrition Diagnosis: NB-1.5 Disordered eating pattern As related to meal skipping and reported binges.  As  evidenced by self report.  Intervention/Goals: Nutrition counseling provided.  As it was difficult to discern what all is going on with her healthcare needs, I suggest having 3 meals a day as a start. Suggested carb, protein, and fat with all three meals.  Discussed meal plans will not be provided, restrictive lists will not be provided, and that to heal form binging, clients need to heal from restricting first.  Monitoring and Evaluation: Patient will follow up in 2 weeks.

## 2017-06-12 NOTE — Telephone Encounter (Signed)
Pt is calling she states she was seen yesterday and Dr. Marius Ditch told her to stop  rx Amedrasole   For 2 weeks and take IV Guard  She needs to know  What the further instructions are  What to take and when to stop please call pt. Ok to leave message [phone]

## 2017-06-12 NOTE — Telephone Encounter (Signed)
Patient LVM that the IB gard will cost her 120.00/monthly. Is there something else she can use? Please call

## 2017-06-13 ENCOUNTER — Telehealth: Payer: Self-pay

## 2017-06-13 NOTE — Telephone Encounter (Signed)
Good morning Dr. Marius Ditch,  Patient contacted office stated that she has been taking IBG as instructed-two capsules 30 mins before each of her meals.  She has experienced indigestion, cramping and diarrhea 5 times yesterday.    Although IBG is OTC over the counter since she is taking 6 a day the largest box is a qty of 48 and cost $27.44 this would be very costly for her.  She would like to know if there is a rx equivalent to the IBG that she can take.   Thanks [employer]

## 2017-06-13 NOTE — Telephone Encounter (Signed)
It has already been sent over.  

## 2017-06-13 NOTE — Telephone Encounter (Signed)
Patient notified via voice mail that medication has been sent to pharmacy.

## 2017-06-14 ENCOUNTER — Other Ambulatory Visit: Payer: Self-pay

## 2017-06-14 ENCOUNTER — Telehealth: Payer: Self-pay | Admitting: Gastroenterology

## 2017-06-14 MED ORDER — DICYCLOMINE HCL 10 MG PO CAPS: 10.0000 mg | ORAL_CAPSULE | Freq: Three times a day (TID) | ORAL | 2 refills | Status: DC

## 2017-06-14 NOTE — Telephone Encounter (Signed)
Discussed with Dr. Marius Ditch.  Notified patient that Dr. Marius Ditch has authorized Dicyclomine 10 mg TID rx sent to pharmacy.  Thanks,  Sharyn Lull

## 2017-06-14 NOTE — Telephone Encounter (Signed)
Patient LVM that she feels the IB gard isn't working and wondering if she can try Dicyclomine or Amitriptyline. Please call

## 2017-06-17 ENCOUNTER — Telehealth: Payer: Self-pay | Admitting: Gastroenterology

## 2017-06-17 NOTE — Telephone Encounter (Signed)
Please call patient with results.

## 2017-06-18 NOTE — Telephone Encounter (Signed)
Patient has been notified of normal results.  She has not picked up Dicyclomine yet.  Complains of abdominal pain and diarrhea.  I asked her to pick up the rx for dicyclomine to see if this will help with the abdominal pain.  She wants to know why you suggest cipro and the other antibiotic.  Thanks [employer]

## 2017-06-18 NOTE — Telephone Encounter (Signed)
Hi-do we have results for patient? She is inquiring.  Thanks [employer]

## 2017-06-21 ENCOUNTER — Telehealth: Payer: Self-pay | Admitting: Gastroenterology

## 2017-06-21 NOTE — Telephone Encounter (Signed)
Pt left vm she states she was supposed to get rx Cipro and it was supposed to be at [facility] she also like to know why she is on the rx

## 2017-06-24 NOTE — Telephone Encounter (Signed)
Patient stated  That she has been taking dicyclomine for about a week and is experiencing loose and runny stools.   Thanks [employer]

## 2017-06-26 ENCOUNTER — Encounter: Payer: Medicare Other | Admitting: *Deleted

## 2017-06-26 DIAGNOSIS — F509 Eating disorder, unspecified: Secondary | ICD-10-CM

## 2017-06-26 DIAGNOSIS — Z713 Dietary counseling and surveillance: Secondary | ICD-10-CM | POA: Diagnosis not present

## 2017-06-26 NOTE — Progress Notes (Signed)
Appointment start time: 1500  Appointment end time: 1600  Patient was seen on 06/26/17 for nutrition counseling pertaining to disordered eating  Primary care provider: Dr. Glori Bickers Therapist: chuck burnett   Assessment Has been more depressed and hasn't left the house in several days Having a internal battle in her mind between the messages she received here last time and the messages society (her mother) gives her.  Got happy at first giving herself permission, then thought that maybe I was misleading her on purpose   Stopped bentyl because she was needing to defecate when she was out. Was taking it 4 times a day.  Wondering if she could decrease the does?   Felt like she was voiding too much, but it was actually normal.  Realizing she is not sure what "normal" is.     Dietary assessment: A typical day consists of 2-3 meals and several snacks  Safe foods include: fast food- taco bell, fried shrimp Avoided foods include:many  24 hour recall:  B: none L: lean cusine S:2 cookies Sugar free hawaiian punch D: protein bar, marshmellows Nabs  Was still hungry  Nutrition Diagnosis: NB-1.5 Disordered eating pattern As related to meal skipping and reported binges.  As evidenced by self report.  Intervention/Goals: Nutrition counseling provided.  Goals are: having 3 meals a day as a start. Suggested carb, protein, and fat with all three meals. Try to eat at a table without the tv   Monitoring and Evaluation: Patient will follow up in 2 weeks.

## 2017-07-03 ENCOUNTER — Encounter: Payer: Medicare Other | Admitting: *Deleted

## 2017-07-03 DIAGNOSIS — Z713 Dietary counseling and surveillance: Secondary | ICD-10-CM | POA: Diagnosis not present

## 2017-07-03 DIAGNOSIS — F509 Eating disorder, unspecified: Secondary | ICD-10-CM

## 2017-07-03 NOTE — Progress Notes (Signed)
Appointment start time: 1400  Appointment end time: 1500  Patient was seen on 07/03/17 for nutrition counseling pertaining to disordered eating  Primary care provider: Dr. Glori Bickers Therapist: chuck burnett   Assessment Not sleeping well, states this is normal. She is more tired  Ordered [employer]    Dietary assessment: A typical day consists of 2-3 meals and several snacks  Safe foods include: fast food- taco bell, fried shrimp Avoided foods include:many  24 hour recall:  cheetohs throughout And chicken on a bagel   Nutrition Diagnosis: NB-1.5 Disordered eating pattern As related to meal skipping and reported binges.  As evidenced by self report.  Intervention/Goals: Nutrition counseling provided.  Goals are: having 3 meals a day as a start. Suggested carb, protein, and fat with all three meals. Try to eat at a table without the tv   Monitoring and Evaluation: Patient will follow up in 2 weeks.

## 2017-07-10 ENCOUNTER — Encounter

## 2017-07-10 ENCOUNTER — Ambulatory Visit: Payer: Medicare Other | Admitting: Podiatry

## 2017-07-10 ENCOUNTER — Encounter: Payer: Self-pay | Admitting: Podiatry

## 2017-07-10 DIAGNOSIS — B351 Tinea unguium: Secondary | ICD-10-CM | POA: Diagnosis not present

## 2017-07-10 DIAGNOSIS — M79676 Pain in unspecified toe(s): Secondary | ICD-10-CM | POA: Diagnosis not present

## 2017-07-10 DIAGNOSIS — I890 Lymphedema, not elsewhere classified: Secondary | ICD-10-CM | POA: Diagnosis not present

## 2017-07-10 DIAGNOSIS — E1142 Type 2 diabetes mellitus with diabetic polyneuropathy: Secondary | ICD-10-CM | POA: Diagnosis not present

## 2017-07-10 MED ORDER — GABAPENTIN 300 MG PO CAPS: 300.0000 mg | ORAL_CAPSULE | Freq: Every day | ORAL | 3 refills | Status: DC

## 2017-07-10 MED ORDER — GABAPENTIN 100 MG PO CAPS: 200.0000 mg | ORAL_CAPSULE | Freq: Two times a day (BID) | ORAL | 3 refills | Status: DC

## 2017-07-10 NOTE — Progress Notes (Signed)
She presents today chief complaint of painful elongated toenails.  She is also complaining of swelling to the bilateral ankles and continued pain in her feet.  She states that she has recently cut back her gabapentin due to the way it makes her feel which is sleepy.  She thinks that it may have something to do with her balance as well that she was having balance difficulties earlier.  Objective: Vital signs are stable she is alert oriented x3 toenails are thick yellow dystrophic clinically mycotic she does have edema to the ankles.  No other palpable pain.  Pain in limb secondary to onychomycosis  Assessment: Diabetic neuropathy associated with chronic pain.  Pain in limb secondary onychomycosis.  Plan: Debridement of toenails 1 through 5 bilateral.  Also debrided reactive hyperkeratosis also told her to increase her gabapentin 200 in the morning 200 at midday and 300 at bedtime.  She was to follow-up with Dr. Amalia Hailey.

## 2017-07-11 ENCOUNTER — Telehealth: Payer: Self-pay | Admitting: Podiatry

## 2017-07-11 ENCOUNTER — Ambulatory Visit: Payer: Medicare Other | Admitting: Podiatry

## 2017-07-11 NOTE — Telephone Encounter (Signed)
Pt said she has too much going on right now with other appointments and would rather not do Physical Therapy right now. Patient asked if you would cancel that order. She would also like a copy of the RX that were sent on 07/10/17.

## 2017-07-11 NOTE — Telephone Encounter (Signed)
I spoke with patient, she stated that she had too much going on right now and wanted to hold off on physical therapy.  I told her I would cancel that order.  I did encourage her that Dr. Milinda Pointer wants her to be see at [facility] clinic and I would call to set that up.  I told her someone would call her with appt time and date.  She verbalized understanding

## 2017-07-11 NOTE — Addendum Note (Signed)
Addended by: Graceann Congress D on: 07/11/2017 09:01 AM   Modules accepted: Orders

## 2017-07-11 NOTE — Addendum Note (Signed)
Addended by: Graceann Congress D on: 07/11/2017 02:11 PM   Modules accepted: Orders

## 2017-07-11 NOTE — Addendum Note (Signed)
Addended by: Graceann Congress D on: 07/11/2017 02:03 PM   Modules accepted: Orders

## 2017-07-11 NOTE — Telephone Encounter (Signed)
She had the gabapentin and other meds sent to the pharmacy.  No copies ????

## 2017-07-12 ENCOUNTER — Telehealth: Payer: Self-pay | Admitting: Podiatry

## 2017-07-12 NOTE — Telephone Encounter (Signed)
Patient is now interested in having therapy. Please call patient back at [phone]

## 2017-07-17 ENCOUNTER — Other Ambulatory Visit: Payer: Self-pay

## 2017-07-17 ENCOUNTER — Other Ambulatory Visit: Payer: Self-pay | Admitting: Family Medicine

## 2017-07-17 ENCOUNTER — Ambulatory Visit: Payer: Medicare Other | Admitting: Gastroenterology

## 2017-07-17 DIAGNOSIS — E1142 Type 2 diabetes mellitus with diabetic polyneuropathy: Secondary | ICD-10-CM

## 2017-07-17 NOTE — Telephone Encounter (Signed)
PT referral has been faxed to [facility] PT and patient has been notified.

## 2017-07-23 ENCOUNTER — Encounter

## 2017-07-24 ENCOUNTER — Encounter: Payer: Self-pay | Admitting: Occupational Therapy

## 2017-07-24 ENCOUNTER — Ambulatory Visit: Payer: Self-pay | Admitting: *Deleted

## 2017-07-24 ENCOUNTER — Other Ambulatory Visit: Payer: Self-pay

## 2017-07-24 ENCOUNTER — Ambulatory Visit: Payer: Medicare Other | Attending: Podiatry | Admitting: Occupational Therapy

## 2017-07-24 DIAGNOSIS — I890 Lymphedema, not elsewhere classified: Secondary | ICD-10-CM | POA: Insufficient documentation

## 2017-07-24 NOTE — Patient Instructions (Signed)

## 2017-07-25 NOTE — Therapy (Signed)
[place] MAIN [facility] SERVICES [address] [place], [place], [address] Phone: [phone]   Fax:  [phone]  Occupational Therapy Evaluation  Patient Details  Name: Mackenzie Bonilla MRN: [id number] Date of Birth: 16-Apr-1965 No data recorded  Encounter Date: 07/24/2017  OT End of Session - 07/24/17 0856    Visit Number  1    Number of Visits  36    Date for OT Re-Evaluation  10/22/17    OT Start Time  1105    OT Stop Time  1208    OT Time Calculation (min)  63 min       Past Medical History:  Diagnosis Date  . Allergy    allergic rhinitis  . Anemia   . Anxiety   . Arthritis    osteoarthritis  . Asthma   . Claustrophobia    does not like oxygen mask covering face  . Depression   . Diabetes mellitus without complication ([place])   . Fall 2016  . Fatty liver 07/2008   abd. ultrasound - fatty liver ; slt dilated cbd (no stones ) 06/10// abd.  ultrasound normal on 04/2006  . Fibromyalgia   . GERD (gastroesophageal reflux disease)    EGD negative 06/2001// EGD erythematous mucosa, polyp 08/2008  . High uric acid in 24 hour urine specimen   . Hyperhidrosis   . Hyperlipidemia   . Kidney stones   . Lymphedema   . Neuromuscular disorder (HCC)    fibromyalgia  . Obesity   . Shortness of breath dyspnea   . Tachycardia   . TMJ (dislocation of temporomandibular joint)   . UTI (lower urinary tract infection)     Past Surgical History:  Procedure Laterality Date  . BREAST BIOPSY Right 2016   neg  . BREAST LUMPECTOMY WITH RADIOACTIVE SEED LOCALIZATION Right 08/02/2014   Procedure: BREAST LUMPECTOMY WITH RADIOACTIVE SEED LOCALIZATION;  Surgeon: Rolm Bookbinder, MD;  Location: [facility];  Service: General;  Laterality: Right;  . COLONOSCOPY  08/12/2012   Completed by Dr. Phylis Bougie, normal exam.   . ENDOMETRIAL BIOPSY  01/2004  . ESOPHAGOGASTRODUODENOSCOPY    . FOOT SURGERY    . SEPTOPLASTY     two times  . TONSILLECTOMY    . UTERINE  FIBROID SURGERY  06/2005   ablation  . uterine tumor  09/2001  . VAGUS NERVE STIMULATOR INSERTION      There were no vitals filed for this visit.     [facility] OT Assessment - 07/25/17 0001      Assessment   Medical Diagnosis  mild, stage II, BLE primary lymphedema (LE). ( Suspect LE [place])  (Pended)     Referring Provider  Tyson Dense, MD  (Pended)     Onset Date/Surgical Date  --  (Pended)  age 52    Hand Dominance  Right  (Pended)     Prior Therapy  no CDT; poor tolerance for compression garments to date by report  (Pended)       Precautions   Precautions  --  (Pended)  DIABETES SKIN PRECAUTIONS      Restrictions   Weight Bearing Restrictions  No  (Pended)       Balance Screen   Has the patient fallen in the past 6 months  No  (Pended)       Home  Environment   Lives With  Alone  (Pended)       Prior Function   Level of Independence  Independent  with basic ADLs;Independent with household mobility without device;Independent with community mobility without device;Independent with transfers  (Pended)     Vocation  On disability  (Pended)     Vocation Requirements  retired Restaurant manager, 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)     Leisure  family time  Museum/gallery conservator)       Mobility   Mobility Status  History of falls  (Pended)     Mobility Status Comments  fall in 2017 resulted in R foot fractures and L ankle fractures w/ sub acute rehab stay  (Pended)       Vision - History   Baseline Vision  Wears glasses all the time  (Pended)       Activity Tolerance   Activity Tolerance  Endurance does not limit participation in activity  (Pended)     Activity Tolerance Comments  activities requiring standing, walking and/ or sitting in dependent osition > 15 mintes cause increased leg and foot pain and increased swelling  (Pended)       Cognition   Overall Cognitive Status  Within Functional Limits for tasks assessed  (Pended)     Cognition Comments  hx severe depression.Pt states this may interfer  w/ participation at times  (Pended)       Posture/Postural Control   Posture/Postural Control  No significant limitations  (Pended)     Posture Comments  mild flexible "head forward"  kyphosis  (Pended)       Sensation   Light Touch  Impaired by gross assessment  (Pended)       Coordination   Gross Motor Movements are Fluid and Coordinated  Yes  (Pended)       ROM / Strength   AROM / PROM / Strength  AROM  (Pended)                       OT Education - 07/24/17 1120    Education Details  Provided Pt/caregiver skilled education regarding lymphatic structure and function, various lymphedema etiologies, obnset patterns and progression,  impact of obesity on lymphatic system function, and  discussed Complete Decongestive Therapy for LE care, including 4 components of Intensive and Self Management Phases.  Discussed   Importance of daily daily LE self care to retain clinical gains and limit progression.  Discussed lymphedema precautions, cellulitis risk,  Provided printed Lymphedema Workbook for reference.    Person(s) Educated  Patient    Methods  Explanation;Demonstration    Comprehension  Verbalized understanding;Returned demonstration          OT Long Term Goals - 07/25/17 1030      OT LONG TERM GOAL #1   Title  Pt independent w/ lymphedema precautions/prevention principals and using printed reference to limit LE progression and infection risk.    Baseline  Max A    Time  2    Period  Weeks    Status  New    Target Date  -- 10th OT Rx visit      OT LONG TERM GOAL #2   Title  Lymphedema (LE) management/ self-care: Pt able to apply knee  length gradient compression wraps using correct techniques with modified independence ( extra time and assistive device/s PRN)   within 2 weeks to achieve optimal limb volume reduction during Intensive Phase of CDT and optimal self-management over time.    Baseline  max A    Time  2    Period  Weeks    Status  New    Target Date   -- 10th OT Rx visit      OT LONG TERM GOAL #3   Title  Lymphedema (LE) management/ self-care:  Pt to achieve at least 10%  limb volume reductions bilaterally  during Intensive Phase CDT to limit LE progression, to decrease infection risk and to improve functional performance in all occupational domains.     Baseline  max a    Time  12    Period  Weeks    Status  New    Target Date  10/22/17      OT LONG TERM GOAL #4   Title  Lymphedema (LE) management/ self-care:  Pt to tolerate daily compression garments and/ or HOS devices in keeping w/ prescribed wear regime within 1 week of issue date to restore normal limb shape to reduce tissue density and protein build up leading to progression.    Baseline  max A    Time  12    Period  Weeks    Status  New    Target Date  10/22/17      OT LONG TERM GOAL #5   Title  Lymphedema (LE) Pain: Pt to report 25% reduction in discomfort/ pain described as LE  "heaviness", "fullness", tightness"" by DC to facilitate improved functional ambulation and mobility and increased independence with basic and instrumental ADLs.     Baseline  max A    Time  12    Period  Weeks    Status  New    Target Date  10/22/17      Long Term Additional Goals   Additional Long Term Goals  Yes      OT LONG TERM GOAL #6   Title  Lymphedema (LE) management/ self-care:  During Management Phase CDT Pt to sustain reduced limb volumes achieved during Intensive Phase CDT during Management Phase CDT within 3% using LE self-care home program components as directed ( simple self MLD, skin care, ther ex and daily/ nightly compression garments/ devices) to limit LE progression and further functional decline.    Baseline  max a    Time  6    Period  Months    Status  New    Target Date  01/21/18            Plan - 07/25/17 1636    Clinical Impression Statement  Mackenzie Bonilla presents with mild stage II, primary, bilateral lower extremity (BLE) lymphedema (LE), left greater  than right. Pt reports diagnosis with lymphoscintigraphy at age 88, which is in keeping with Lymphedema Preacox pattern of onset. Exacerbating factors include morbid obesity, dependent positioning, sedentary lifestyle, systemic fluid retention and chronic inflammation 2/2 OA of the knees. Pt denies hx of cellulitis. BLE LE and associated  pain currently limits functional performance in all occupational domains, including basic and instrumental ADLs, (difficulty fitting LB clothing and street shoes, difficulty reaching feet to bathe legs and feet, inspect skin and perform skin and nail care) impaired standing, walking and sitting tolerance , limited participation in  home management tasks and productive activities, difficulty driving, and limited ability to participate in leisure, social, and community activities. Lastly, disfiguring leg swelling contributes to depression and decreased body image. Without skilled Occupational Therapy for Intensive and Management phase Complete Decongestive Therapy (CDT) to address chronic, progressive BLE LE, this patient's condition will worsen and further functional decline is expected.    Occupational Profile and client history currently impacting functional performance  Pt states  episodes of severe depression may impact participation in OT    Occupational performance deficits (Please refer to evaluation for details):  ADL's;IADL's;Work;Leisure;Social Participation;Other decreased body image    Rehab Potential  Good    OT Frequency  3x / week    OT Duration  12 weeks    OT Treatment/Interventions  Self-care/ADL training;Therapeutic exercise;Manual lymph drainage;Coping strategies training;Compression bandaging;Patient/family education;Other (comment);Energy conservation;Therapist, nutritional;Therapeutic activities;DME and/or AE instruction;Manual Therapy    Plan  Fit with knee length BLE compression garments for daytime use and knee length HOS device to limit  fibrosis formation and improve lymphatic function during HOS.    Clinical Decision Making  Several treatment options, min-mod task modification necessary    Consulted and Agree with Plan of Care  Patient       Patient will benefit from skilled therapeutic intervention in order to improve the following deficits and impairments:  Abnormal gait, Decreased knowledge of use of DME, Decreased skin integrity, Increased edema, Impaired flexibility, Pain, Decreased mobility, Impaired sensation, Decreased coping skills, Decreased range of motion, Decreased activity tolerance, Decreased knowledge of precautions, Difficulty walking, Obesity  Visit Diagnosis: Lymphedema, not elsewhere classified - Plan: Ot plan of care cert/re-cert    Problem List Patient Active Problem List   Diagnosis Date Noted  . Eating disorder 05/23/2017  . Abdominal pain, right upper quadrant 05/01/2017  . Gallstones 04/19/2017  . Diabetic polyneuropathy associated with type 2 diabetes mellitus ([place]) 04/07/2017  . Diabetic angiopathy ([place]) 04/07/2017  . Vasomotor symptoms due to menopause 04/07/2017  . Mobility impaired 11/12/2016  . Diarrhea 09/05/2016  . Urinary incontinence 12/02/2015  . Candidal intertrigo 04/06/2015  . Cystocele 04/06/2015  . Constipation 04/06/2015  . Back pain 12/28/2014  . Hypertriglyceridemia 10/14/2014  . Breast mass in female 07/28/2014  . Fall 07/23/2013  . Great toe pain 05/12/2013  . Medication management 11/20/2012  . Abdominal pain 04/23/2012  . Uric acid kidney stone 11/20/2011  . HYPERHIDROSIS 11/07/2009  . Menopausal symptoms 06/17/2009  . Type 2 diabetes mellitus without complication, without long-term current use of insulin ([place]) 08/23/2008  . VITAMIN D DEFICIENCY 05/07/2008  . VITAMIN B12 DEFICIENCY 06/04/2007  . CHRONIC FATIGUE SYNDROME 11/15/2006  . DIVERTICULITIS, HX OF 11/15/2006  . PCOS (polycystic ovarian syndrome) 10/16/2006  . Hyperlipidemia LDL goal <100 10/16/2006   . Morbid obesity with BMI of 50.0-59.9, adult ([place]) 10/16/2006  . Generalized anxiety disorder 10/16/2006  . PANIC DISORDER 10/16/2006  . BULIMIA 10/16/2006  . Chronic depression 10/16/2006  . CARPAL TUNNEL SYNDROME 10/16/2006  . LYMPHEDEMA 10/16/2006  . Allergic rhinitis 10/16/2006  . Mild intermittent asthma 10/16/2006  . TMJ SYNDROME 10/16/2006  . GERD 10/16/2006  . IRRITABLE BOWEL SYNDROME 10/16/2006  . OSTEOARTHRITIS 10/16/2006  . Myalgia and myositis 10/16/2006  . COUGH, CHRONIC 10/16/2006    Andrey Spearman, MS, OTR/L, [facility] 07/25/17 4:43 PM   [place] MAIN [facility] SERVICES [address] [place], [place], [address] Phone: [phone]   Fax:  [phone]  Name: Mackenzie Bonilla MRN: [id number] Date of Birth: January 25, 1966

## 2017-07-29 ENCOUNTER — Ambulatory Visit: Payer: Medicare Other | Admitting: Occupational Therapy

## 2017-07-29 DIAGNOSIS — I890 Lymphedema, not elsewhere classified: Secondary | ICD-10-CM | POA: Diagnosis not present

## 2017-07-29 NOTE — Therapy (Signed)
[place] MAIN [facility] SERVICES [address] [place], [place], [address] Phone: [phone]   Fax:  [phone]  Occupational Therapy Treatment  Patient Details  Name: Mackenzie Bonilla MRN: [id number] Date of Birth: [birth year] No data recorded  Encounter Date: 07/29/2017  OT End of Session - 07/29/17 1740    Visit Number  2    Number of Visits  36    Date for OT Re-Evaluation  10/22/17    OT Start Time  0208    OT Stop Time  0310    OT Time Calculation (min)  62 min       Past Medical History:  Diagnosis Date  . Allergy    allergic rhinitis  . Anemia   . Anxiety   . Arthritis    osteoarthritis  . Asthma   . Claustrophobia    does not like oxygen mask covering face  . Depression   . Diabetes mellitus without complication ([place])   . Fall 2016  . Fatty liver 07/2008   abd. ultrasound - fatty liver ; slt dilated cbd (no stones ) 06/10// abd.  ultrasound normal on 04/2006  . Fibromyalgia   . GERD (gastroesophageal reflux disease)    EGD negative 06/2001// EGD erythematous mucosa, polyp 08/2008  . High uric acid in 24 hour urine specimen   . Hyperhidrosis   . Hyperlipidemia   . Kidney stones   . Lymphedema   . Neuromuscular disorder (HCC)    fibromyalgia  . Obesity   . Shortness of breath dyspnea   . Tachycardia   . TMJ (dislocation of temporomandibular joint)   . UTI (lower urinary tract infection)     Past Surgical History:  Procedure Laterality Date  . BREAST BIOPSY Right 2016   neg  . BREAST LUMPECTOMY WITH RADIOACTIVE SEED LOCALIZATION Right 08/02/2014   Procedure: BREAST LUMPECTOMY WITH RADIOACTIVE SEED LOCALIZATION;  Surgeon: Rolm Bookbinder, MD;  Location: [place];  Service: General;  Laterality: Right;  . COLONOSCOPY  08/12/2012   Completed by Dr. Phylis Bougie, normal exam.   . ENDOMETRIAL BIOPSY  01/2004  . ESOPHAGOGASTRODUODENOSCOPY    . FOOT SURGERY    . SEPTOPLASTY     two times  . TONSILLECTOMY    . UTERINE  FIBROID SURGERY  06/2005   ablation  . uterine tumor  09/2001  . VAGUS NERVE STIMULATOR INSERTION      There were no vitals filed for this visit.  Subjective Assessment - 07/29/17 1726    Subjective   Mackenzie Bonilla is here for OT visit 2/36 to commence Intensive Phase CDT to BLE, with inital emphasis on LLE. Pt is eager to commence treatment. She expresses concern regarding tolerating compression wraps with high outdoor temperatures.    Pertinent History  Primary LE dx at age 32 is consistent with LE Praecox, Lymphedema praecox, also known as Meige disease, is the most common form of primary lymphedema. By definition, this disease becomes clinically evident after birth and before age 37 years. L ankle fx 2017, obesity, depression, OA- B knees, chromnic back pain, type II diabetes     Limitations  chronic leg pain and swelling, impaired standing tolerance, difficulty fitting LB clothing and street shoes,     Repetition  Increases Symptoms    Patient Stated Goals  get swelling down and relieve leg pain so I can do more    Currently in Pain?  Yes unchanged    frm report    at initial  eval.     Pain Onset  More than a month ago                   OT Treatments/Exercises (OP) - 07/29/17 0001      ADLs   ADL Education Given  Yes      Manual Therapy   Manual Therapy  Edema management;Compression Bandaging    Manual therapy comments  completed initial comparativeBLE limb volumetrics     from feet to groin.                  OT Long Term Goals - 07/25/17 1030      OT LONG TERM GOAL #1   Title  Pt independent w/ lymphedema precautions/prevention principals and using printed reference to limit LE progression and infection risk.    Baseline  Max A    Time  2    Period  Weeks    Status  New    Target Date  -- 10th OT Rx visit      OT LONG TERM GOAL #2   Title  Lymphedema (LE) management/ self-care: Pt able to apply knee  length gradient compression wraps using correct  techniques with modified independence ( extra time and assistive device/s PRN)   within 2 weeks to achieve optimal limb volume reduction during Intensive Phase of CDT and optimal self-management over time.    Baseline  max A    Time  2    Period  Weeks    Status  New    Target Date  -- 10th OT Rx visit      OT LONG TERM GOAL #3   Title  Lymphedema (LE) management/ self-care:  Pt to achieve at least 10%  limb volume reductions bilaterally  during Intensive Phase CDT to limit LE progression, to decrease infection risk and to improve functional performance in all occupational domains.     Baseline  max a    Time  12    Period  Weeks    Status  New    Target Date  10/22/17      OT LONG TERM GOAL #4   Title  Lymphedema (LE) management/ self-care:  Pt to tolerate daily compression garments and/ or HOS devices in keeping w/ prescribed wear regime within 1 week of issue date to restore normal limb shape to reduce tissue density and protein build up leading to progression.    Baseline  max A    Time  12    Period  Weeks    Status  New    Target Date  10/22/17      OT LONG TERM GOAL #5   Title  Lymphedema (LE) Pain: Pt to report 25% reduction in discomfort/ pain described as LE  "heaviness", "fullness", tightness"" by DC to facilitate improved functional ambulation and mobility and increased independence with basic and instrumental ADLs.     Baseline  max A    Time  12    Period  Weeks    Status  New    Target Date  10/22/17      Long Term Additional Goals   Additional Long Term Goals  Yes      OT LONG TERM GOAL #6   Title  Lymphedema (LE) management/ self-care:  During Management Phase CDT Pt to sustain reduced limb volumes achieved during Intensive Phase CDT during Management Phase CDT within 3% using LE self-care home program components as directed ( simple self MLD,  skin care, ther ex and daily/ nightly compression garments/ devices) to limit LE progression and further functional  decline.    Baseline  max a    Time  6    Period  Months    Status  New    Target Date  01/21/18            Plan - 07/29/17 1729    Clinical Impression Statement  completed initial comparativeBLE limb volumetrics from feet to groin. LLE presents with obvious increased limb volume    to visual assessment, and increased, dense   tissue congestion by palpation. Interestingly limb volumetrics fromn ankle to tibial tuberosity (A-D) reveal only a very slight limb volume differential (LVD) measuring 0.03%, R>L. RLE knee to thigh (E-G) LVG measures 6.9% , L>R, and LVG for full leg A-G measures minimal difference aslo WNL. as  with A-D), at 3.66%, R>L. What these values do not capture is that both legs are swollen fairly symetrically, but swelling is distributed differntly, Pt  tolerated knee length, gradient compression wrap using garadient tecyhnique. Pt off to PT for leg strengthening after OT session. Cont as per POC.    Occupational Profile and client history currently impacting functional performance  Pt states episodes of severe depression may impact participation in OT    Occupational performance deficits (Please refer to evaluation for details):  ADL's;IADL's;Work;Leisure;Social Participation;Other decreased body image    Rehab Potential  Good    OT Frequency  3x / week    OT Duration  12 weeks    OT Treatment/Interventions  Self-care/ADL training;Therapeutic exercise;Manual lymph drainage;Coping strategies training;Compression bandaging;Patient/family education;Other (comment);Energy conservation;Therapist, nutritional;Therapeutic activities;DME and/or AE instruction;Manual Therapy    Plan  Fit with knee length BLE compression garments for daytime use and knee length HOS device to limit fibrosis formation and improve lymphatic function during HOS.    Clinical Decision Making  Several treatment options, min-mod task modification necessary    Consulted and Agree with Plan of Care   Patient       Patient will benefit from skilled therapeutic intervention in order to improve the following deficits and impairments:  Abnormal gait, Decreased knowledge of use of DME, Decreased skin integrity, Increased edema, Impaired flexibility, Pain, Decreased mobility, Impaired sensation, Decreased coping skills, Decreased range of motion, Decreased activity tolerance, Decreased knowledge of precautions, Difficulty walking, Obesity  Visit Diagnosis: Lymphedema, not elsewhere classified    Problem List Patient Active Problem List   Diagnosis Date Noted  . Eating disorder 05/23/2017  . Abdominal pain, right upper quadrant 05/01/2017  . Gallstones 04/19/2017  . Diabetic polyneuropathy associated with type 2 diabetes mellitus ([place]) 04/07/2017  . Diabetic angiopathy ([place]) 04/07/2017  . Vasomotor symptoms due to menopause 04/07/2017  . Mobility impaired 11/12/2016  . Diarrhea 09/05/2016  . Urinary incontinence 12/02/2015  . Candidal intertrigo 04/06/2015  . Cystocele 04/06/2015  . Constipation 04/06/2015  . Back pain 12/28/2014  . Hypertriglyceridemia 10/14/2014  . Breast mass in female 07/28/2014  . Fall 07/23/2013  . Great toe pain 05/12/2013  . Medication management 11/20/2012  . Abdominal pain 04/23/2012  . Uric acid kidney stone 11/20/2011  . HYPERHIDROSIS 11/07/2009  . Menopausal symptoms 06/17/2009  . Type 2 diabetes mellitus without complication, without long-term current use of insulin ([place]) 08/23/2008  . VITAMIN D DEFICIENCY 05/07/2008  . VITAMIN B12 DEFICIENCY 06/04/2007  . CHRONIC FATIGUE SYNDROME 11/15/2006  . DIVERTICULITIS, HX OF 11/15/2006  . PCOS (polycystic ovarian syndrome) 10/16/2006  . Hyperlipidemia LDL goal <100  10/16/2006  . Morbid obesity with BMI of 50.0-59.9, adult ([place]) 10/16/2006  . Generalized anxiety disorder 10/16/2006  . PANIC DISORDER 10/16/2006  . BULIMIA 10/16/2006  . Chronic depression 10/16/2006  . CARPAL TUNNEL SYNDROME 10/16/2006   . LYMPHEDEMA 10/16/2006  . Allergic rhinitis 10/16/2006  . Mild intermittent asthma 10/16/2006  . TMJ SYNDROME 10/16/2006  . GERD 10/16/2006  . IRRITABLE BOWEL SYNDROME 10/16/2006  . OSTEOARTHRITIS 10/16/2006  . Myalgia and myositis 10/16/2006  . COUGH, CHRONIC 10/16/2006   Andrey Spearman, MS, OTR/L, [facility] 07/29/17 5:43 PM    [place] MAIN [facility] SERVICES [address] [place], [place], [address] Phone: [phone]   Fax:  [phone]  Name: [place] Bonilla MRN: [id number] Date of Birth: 10/21/1965

## 2017-07-31 ENCOUNTER — Encounter: Payer: Medicare Other | Attending: Family Medicine | Admitting: *Deleted

## 2017-07-31 DIAGNOSIS — E1169 Type 2 diabetes mellitus with other specified complication: Secondary | ICD-10-CM

## 2017-07-31 DIAGNOSIS — Z6841 Body Mass Index (BMI) 40.0 and over, adult: Secondary | ICD-10-CM | POA: Insufficient documentation

## 2017-07-31 DIAGNOSIS — F502 Bulimia nervosa: Secondary | ICD-10-CM

## 2017-07-31 DIAGNOSIS — Z713 Dietary counseling and surveillance: Secondary | ICD-10-CM | POA: Diagnosis not present

## 2017-07-31 DIAGNOSIS — F509 Eating disorder, unspecified: Secondary | ICD-10-CM | POA: Insufficient documentation

## 2017-07-31 DIAGNOSIS — K5900 Constipation, unspecified: Secondary | ICD-10-CM

## 2017-07-31 DIAGNOSIS — E119 Type 2 diabetes mellitus without complications: Secondary | ICD-10-CM | POA: Insufficient documentation

## 2017-07-31 NOTE — Progress Notes (Signed)
Appointment start time: 1600  Appointment end time: 1700  Patient was seen on 07/31/17 for nutrition counseling pertaining to disordered eating  Primary care provider: Dr. Glori Bickers Therapist: chuck burnett   Assessment Reading [employer] and it's confusing because her mother disagrees with those messages Is in a lot of pain from lymphedema  Struggles to get in consistent meals due to various health maladies.  Has been given conflicting advice   Dietary assessment: A typical day consists of 2-3 meals and several snacks  Safe foods include: fast food- taco bell, fried shrimp Avoided foods include:many  24 hour recall:  Slept late Ate something small Stomach started cramping and got nauseated  arby's at 4 pm : 2 cookies, 2 sliders, italian sandwich cheeseball  Today ate around 11 am and lost control of her bowels   Nutrition Diagnosis: NB-1.5 Disordered eating pattern As related to meal skipping and reported binges.  As evidenced by self report.  Intervention/Goals: Nutrition counseling provided.  Goals are: having 3 meals a day as a start. Suggested carb, protein, and fat with all three meals.    Monitoring and Evaluation: Patient will follow up in 1 weeks.

## 2017-08-01 ENCOUNTER — Ambulatory Visit: Payer: Medicare Other | Admitting: Occupational Therapy

## 2017-08-01 DIAGNOSIS — I890 Lymphedema, not elsewhere classified: Secondary | ICD-10-CM

## 2017-08-01 NOTE — Therapy (Signed)
[place] MAIN [facility] SERVICES [address] [place], [place], [address] Phone: [phone]   Fax:  [phone]  Occupational Therapy Treatment  Patient Details  Name: [place] Bonilla MRN: [id number] Date of Birth: [birth year] No data recorded  Encounter Date: 08/01/2017  OT End of Session - 08/01/17 1747    Visit Number  3    Number of Visits  36    Date for OT Re-Evaluation  10/22/17    OT Start Time  0300    OT Stop Time  0430    OT Time Calculation (min)  90 min       Past Medical History:  Diagnosis Date  . Allergy    allergic rhinitis  . Anemia   . Anxiety   . Arthritis    osteoarthritis  . Asthma   . Claustrophobia    does not like oxygen mask covering face  . Depression   . Diabetes mellitus without complication ([place])   . Fall 2016  . Fatty liver 07/2008   abd. ultrasound - fatty liver ; slt dilated cbd (no stones ) 06/10// abd.  ultrasound normal on 04/2006  . Fibromyalgia   . GERD (gastroesophageal reflux disease)    EGD negative 06/2001// EGD erythematous mucosa, polyp 08/2008  . High uric acid in 24 hour urine specimen   . Hyperhidrosis   . Hyperlipidemia   . Kidney stones   . Lymphedema   . Neuromuscular disorder (HCC)    fibromyalgia  . Obesity   . Shortness of breath dyspnea   . Tachycardia   . TMJ (dislocation of temporomandibular joint)   . UTI (lower urinary tract infection)     Past Surgical History:  Procedure Laterality Date  . BREAST BIOPSY Right 2016   neg  . BREAST LUMPECTOMY WITH RADIOACTIVE SEED LOCALIZATION Right 08/02/2014   Procedure: BREAST LUMPECTOMY WITH RADIOACTIVE SEED LOCALIZATION;  Surgeon: Rolm Bookbinder, MD;  Location: [place];  Service: General;  Laterality: Right;  . COLONOSCOPY  08/12/2012   Completed by Dr. Phylis Bougie, normal exam.   . ENDOMETRIAL BIOPSY  01/2004  . ESOPHAGOGASTRODUODENOSCOPY    . FOOT SURGERY    . SEPTOPLASTY     two times  . TONSILLECTOMY    . UTERINE  FIBROID SURGERY  06/2005   ablation  . uterine tumor  09/2001  . VAGUS NERVE STIMULATOR INSERTION      There were no vitals filed for this visit.  Subjective Assessment - 08/01/17 1509    Subjective   M[place]enzie Bonilla is here for OT visit 3/36 to commence Intensive Phase CDT to BLE, with inital emphasis on LLE. Pt is accompanied by her mother, who is hear to learn compressiong wrapping techniques to assist Pt between clinical visits. Pt reports she tolerated compression wraps > 24 hours since ilast visit. She experienced histamine skin reaction ( redness and itchnig) when removing wraps. "They are hard to get around in. I was OK as long as I was sitting still. They didnt bother my feet and I hand no neuropathy in that foot when it was wrapped."    Pertinent History  Primary LE dx at age 52 is consistent with LE Praecox, Lymphedema praecox, also known as Meige disease, is the most common form of primary lymphedema. By definition, this disease becomes clinically evident after birth and before age 30 years. L ankle fx 2017, obesity, depression, OA- B knees, chromnic back pain, type II diabetes     Limitations  chronic leg pain and swelling, impaired standing tolerance, difficulty fitting LB clothing and street shoes,     Repetition  Increases Symptoms    Patient Stated Goals  get swelling down and relieve leg pain so I can do more    Currently in Pain?  Yes chronmic pain unchanged.    Pain Location  Leg    Pain Orientation  Left;Right    Pain Descriptors / Indicators  Numbness;Tightness;Burning;Tingling;Shooting;Constant;Guarding;Sore;Tiring;Pins and needles;Heaviness;Cramping;Pressure;Tender;Discomfort;Throbbing    Pain Type  Chronic pain    Pain Onset  More than a month ago                   OT Treatments/Exercises (OP) - 08/01/17 0001      ADLs   ADL Education Given  Yes      Manual Therapy   Manual Therapy  Edema management;Compression Bandaging    Compression Bandaging  4  layer gradient compressino wrap to LLE             OT Education - 08/01/17 1747    Education Details  Session devoted to Pt and parent edu for correct gradient compression wrapping techjniques    Person(s) Educated  Patient;Parent(s)    Methods  Explanation;Demonstration;Tactile cues;Verbal cues;Handout    Comprehension  Verbalized understanding;Returned demonstration;Verbal cues required;Tactile cues required;Need further instruction          OT Long Term Goals - 07/25/17 1030      OT LONG TERM GOAL #1   Title  Pt independent w/ lymphedema precautions/prevention principals and using printed reference to limit LE progression and infection risk.    Baseline  Max A    Time  2    Period  Weeks    Status  New    Target Date  -- 10th OT Rx visit      OT LONG TERM GOAL #2   Title  Lymphedema (LE) management/ self-care: Pt able to apply knee  length gradient compression wraps using correct techniques with modified independence ( extra time and assistive device/s PRN)   within 2 weeks to achieve optimal limb volume reduction during Intensive Phase of CDT and optimal self-management over time.    Baseline  max A    Time  2    Period  Weeks    Status  New    Target Date  -- 10th OT Rx visit      OT LONG TERM GOAL #3   Title  Lymphedema (LE) management/ self-care:  Pt to achieve at least 10%  limb volume reductions bilaterally  during Intensive Phase CDT to limit LE progression, to decrease infection risk and to improve functional performance in all occupational domains.     Baseline  max a    Time  12    Period  Weeks    Status  New    Target Date  10/22/17      OT LONG TERM GOAL #4   Title  Lymphedema (LE) management/ self-care:  Pt to tolerate daily compression garments and/ or HOS devices in keeping w/ prescribed wear regime within 1 week of issue date to restore normal limb shape to reduce tissue density and protein build up leading to progression.    Baseline  max A     Time  12    Period  Weeks    Status  New    Target Date  10/22/17      OT LONG TERM GOAL #5   Title  Lymphedema (LE)  Pain: Pt to report 25% reduction in discomfort/ pain described as LE  "heaviness", "fullness", tightness"" by DC to facilitate improved functional ambulation and mobility and increased independence with basic and instrumental ADLs.     Baseline  max A    Time  12    Period  Weeks    Status  New    Target Date  10/22/17      Long Term Additional Goals   Additional Long Term Goals  Yes      OT LONG TERM GOAL #6   Title  Lymphedema (LE) management/ self-care:  During Management Phase CDT Pt to sustain reduced limb volumes achieved during Intensive Phase CDT during Management Phase CDT within 3% using LE self-care home program components as directed ( simple self MLD, skin care, ther ex and daily/ nightly compression garments/ devices) to limit LE progression and further functional decline.    Baseline  max a    Time  6    Period  Months    Status  New    Target Date  01/21/18            Plan - 08/01/17 1748    Clinical Impression Statement  Pt's Lymphedema Life Impact Scale (LLIS) scored 26.47% on initial measu5rement . This value reresents the extent to which Pt feels lymphedema related problems have affected her life in the last week. Session devoted to Pt and CG edu for applying compression wraps usng correct gradient techniques. By end of session  mother was able to apply wraps with max assist. Pt is currently dependent for applying wraps as she is unable to reach her feet. It is hoped tyhat she will be able to direct a caregive w/ verbal instructions in how to apply wraps if her mother is unavailable. Cont as per POC.    Occupational Profile and client history currently impacting functional performance  Pt states episodes of severe depression may impact participation in OT    Occupational performance deficits (Please refer to evaluation for details):   ADL's;IADL's;Work;Leisure;Social Participation;Other decreased body image    Rehab Potential  Good    OT Frequency  3x / week    OT Duration  12 weeks    OT Treatment/Interventions  Self-care/ADL training;Therapeutic exercise;Manual lymph drainage;Coping strategies training;Compression bandaging;Patient/family education;Other (comment);Energy conservation;Therapist, nutritional;Therapeutic activities;DME and/or AE instruction;Manual Therapy    Plan  Fit with knee length BLE compression garments for daytime use and knee length HOS device to limit fibrosis formation and improve lymphatic function during HOS.    Clinical Decision Making  Several treatment options, min-mod task modification necessary    Consulted and Agree with Plan of Care  Patient       Patient will benefit from skilled therapeutic intervention in order to improve the following deficits and impairments:  Abnormal gait, Decreased knowledge of use of DME, Decreased skin integrity, Increased edema, Impaired flexibility, Pain, Decreased mobility, Impaired sensation, Decreased coping skills, Decreased range of motion, Decreased activity tolerance, Decreased knowledge of precautions, Difficulty walking, Obesity  Visit Diagnosis: Lymphedema, not elsewhere classified    Problem List Patient Active Problem List   Diagnosis Date Noted  . Eating disorder 05/23/2017  . Abdominal pain, right upper quadrant 05/01/2017  . Gallstones 04/19/2017  . Diabetic polyneuropathy associated with type 2 diabetes mellitus ([place]) 04/07/2017  . Diabetic angiopathy ([place]) 04/07/2017  . Vasomotor symptoms due to menopause 04/07/2017  . Mobility impaired 11/12/2016  . Diarrhea 09/05/2016  . Urinary incontinence 12/02/2015  . Candidal  intertrigo 04/06/2015  . Cystocele 04/06/2015  . Constipation 04/06/2015  . Back pain 12/28/2014  . Hypertriglyceridemia 10/14/2014  . Breast mass in female 07/28/2014  . Fall 07/23/2013  . Great toe pain  05/12/2013  . Medication management 11/20/2012  . Abdominal pain 04/23/2012  . Uric acid kidney stone 11/20/2011  . HYPERHIDROSIS 11/07/2009  . Menopausal symptoms 06/17/2009  . Type 2 diabetes mellitus without complication, without long-term current use of insulin ([place]) 08/23/2008  . VITAMIN D DEFICIENCY 05/07/2008  . VITAMIN B12 DEFICIENCY 06/04/2007  . CHRONIC FATIGUE SYNDROME 11/15/2006  . DIVERTICULITIS, HX OF 11/15/2006  . PCOS (polycystic ovarian syndrome) 10/16/2006  . Hyperlipidemia LDL goal <100 10/16/2006  . Morbid obesity with BMI of 50.0-59.9, adult ([place]) 10/16/2006  . Generalized anxiety disorder 10/16/2006  . PANIC DISORDER 10/16/2006  . BULIMIA 10/16/2006  . Chronic depression 10/16/2006  . CARPAL TUNNEL SYNDROME 10/16/2006  . LYMPHEDEMA 10/16/2006  . Allergic rhinitis 10/16/2006  . Mild intermittent asthma 10/16/2006  . TMJ SYNDROME 10/16/2006  . GERD 10/16/2006  . IRRITABLE BOWEL SYNDROME 10/16/2006  . OSTEOARTHRITIS 10/16/2006  . Myalgia and myositis 10/16/2006  . COUGH, CHRONIC 10/16/2006   Andrey Spearman, MS, OTR/L, [facility] 08/01/17 5:52 PM    [place] MAIN [facility] SERVICES [address] [place], [place], [address] Phone: [phone]   Fax:  [phone]  Name: GREGG HOLSTER MRN: [id number] Date of Birth: 07-02-1965

## 2017-08-05 ENCOUNTER — Ambulatory Visit: Payer: Medicare Other | Admitting: Occupational Therapy

## 2017-08-05 DIAGNOSIS — I890 Lymphedema, not elsewhere classified: Secondary | ICD-10-CM

## 2017-08-05 NOTE — Therapy (Signed)
[place] MAIN [facility] SERVICES [address] [place], [place], [address] Phone: [phone]   Fax:  [phone]  Occupational Therapy Treatment  Patient Details  Name: Mackenzie Bonilla MRN: [id number] Date of Birth: 1965/05/11 No data recorded  Encounter Date: 08/05/2017  OT End of Session - 08/05/17 1231    Visit Number  4    Number of Visits  36    Date for OT Re-Evaluation  10/22/17    OT Start Time  1100    OT Stop Time  1223    OT Time Calculation (min)  83 min       Past Medical History:  Diagnosis Date  . Allergy    allergic rhinitis  . Anemia   . Anxiety   . Arthritis    osteoarthritis  . Asthma   . Claustrophobia    does not like oxygen mask covering face  . Depression   . Diabetes mellitus without complication ([place])   . Fall 2016  . Fatty liver 07/2008   abd. ultrasound - fatty liver ; slt dilated cbd (no stones ) 06/10// abd.  ultrasound normal on 04/2006  . Fibromyalgia   . GERD (gastroesophageal reflux disease)    EGD negative 06/2001// EGD erythematous mucosa, polyp 08/2008  . High uric acid in 24 hour urine specimen   . Hyperhidrosis   . Hyperlipidemia   . Kidney stones   . Lymphedema   . Neuromuscular disorder (HCC)    fibromyalgia  . Obesity   . Shortness of breath dyspnea   . Tachycardia   . TMJ (dislocation of temporomandibular joint)   . UTI (lower urinary tract infection)     Past Surgical History:  Procedure Laterality Date  . BREAST BIOPSY Right 2016   neg  . BREAST LUMPECTOMY WITH RADIOACTIVE SEED LOCALIZATION Right 08/02/2014   Procedure: BREAST LUMPECTOMY WITH RADIOACTIVE SEED LOCALIZATION;  Surgeon: Rolm Bookbinder, MD;  Location: [facility];  Service: General;  Laterality: Right;  . COLONOSCOPY  08/12/2012   Completed by Dr. Phylis Bougie, normal exam.   . ENDOMETRIAL BIOPSY  01/2004  . ESOPHAGOGASTRODUODENOSCOPY    . FOOT SURGERY    . SEPTOPLASTY     two times  . TONSILLECTOMY    . UTERINE  FIBROID SURGERY  06/2005   ablation  . uterine tumor  09/2001  . VAGUS NERVE STIMULATOR INSERTION      There were no vitals filed for this visit.  Subjective Assessment - 08/05/17 1101    Subjective   Mackenzie Bonilla is here for OT visit 4/36 for Intensive Phase CDT to BLE, with inital emphasis on LLE. Pt is unaccompanied today. Pt reports she and her mom attempted compression wrapping over the weekend visit interval as instructed at llast clinical visit.  Pt reorts  she aher mom disagreed re several steps in wrapping sequence. "I tried it on several different seats and    on my sofa, but no matter what I couldnt reach my feet to wrap my leg. I can't ask my mother to do the wraps every day, maybe one a week I can. " Pt and O)T discussed options for compression wrapping schedule during intervals.    Pertinent History  Primary LE dx at age 72 is consistent with LE Praecox, Lymphedema praecox, also known as Meige disease, is the most common form of primary lymphedema. By definition, this disease becomes clinically evident after birth and before age 77 years. L ankle fx 2017, obesity, depression,  OA- B knees, chromnic back pain, type II diabetes     Limitations  chronic leg pain and swelling, impaired standing tolerance, difficulty fitting LB clothing and street shoes,     Repetition  Increases Symptoms    Patient Stated Goals  get swelling down and relieve leg pain so I can do more    Currently in Pain?  No/denies    Pain Onset  More than a month ago                   OT Treatments/Exercises (OP) - 08/05/17 0001      ADLs   ADL Education Given  Yes      Manual Therapy   Manual Therapy  Edema management;Manual Lymphatic Drainage (MLD);Compression Bandaging    Manual therapy comments  skin care toLLE w/ low ph castor oil during MLD, and with low ph Eucerin lotio0n prior to reapplying wraps.    Manual Lymphatic Drainage (MLD)  MLD to LLE utilizing deep abdominal lymphatics, short neck  sequence, functional inguinal LN and popliteal LN in supine. Pt instructed for diaphragmatic breathing, J stroke and sh9ort neck sequence. Handouts given.     Compression Bandaging  4 layer gradient compressino wrap to LLE             OT Education - 08/05/17 1230    Education Details  Cont Pt edu for cmopression wrapping. Pt instructed on intro level simple self MLD    Person(s) Educated  Patient;Parent(s)    Methods  Explanation;Demonstration;Tactile cues;Verbal cues;Handout    Comprehension  Verbalized understanding;Returned demonstration;Verbal cues required;Tactile cues required;Need further instruction          OT Long Term Goals - 07/25/17 1030      OT LONG TERM GOAL #1   Title  Pt independent w/ lymphedema precautions/prevention principals and using printed reference to limit LE progression and infection risk.    Baseline  Max A    Time  2    Period  Weeks    Status  New    Target Date  -- 10th OT Rx visit      OT LONG TERM GOAL #2   Title  Lymphedema (LE) management/ self-care: Pt able to apply knee  length gradient compression wraps using correct techniques with modified independence ( extra time and assistive device/s PRN)   within 2 weeks to achieve optimal limb volume reduction during Intensive Phase of CDT and optimal self-management over time.    Baseline  max A    Time  2    Period  Weeks    Status  New    Target Date  -- 10th OT Rx visit      OT LONG TERM GOAL #3   Title  Lymphedema (LE) management/ self-care:  Pt to achieve at least 10%  limb volume reductions bilaterally  during Intensive Phase CDT to limit LE progression, to decrease infection risk and to improve functional performance in all occupational domains.     Baseline  max a    Time  12    Period  Weeks    Status  New    Target Date  10/22/17      OT LONG TERM GOAL #4   Title  Lymphedema (LE) management/ self-care:  Pt to tolerate daily compression garments and/ or HOS devices in keeping w/  prescribed wear regime within 1 week of issue date to restore normal limb shape to reduce tissue density and protein build up leading  to progression.    Baseline  max A    Time  12    Period  Weeks    Status  New    Target Date  10/22/17      OT LONG TERM GOAL #5   Title  Lymphedema (LE) Pain: Pt to report 25% reduction in discomfort/ pain described as LE  "heaviness", "fullness", tightness"" by DC to facilitate improved functional ambulation and mobility and increased independence with basic and instrumental ADLs.     Baseline  max A    Time  12    Period  Weeks    Status  New    Target Date  10/22/17      Long Term Additional Goals   Additional Long Term Goals  Yes      OT LONG TERM GOAL #6   Title  Lymphedema (LE) management/ self-care:  During Management Phase CDT Pt to sustain reduced limb volumes achieved during Intensive Phase CDT during Management Phase CDT within 3% using LE self-care home program components as directed ( simple self MLD, skin care, ther ex and daily/ nightly compression garments/ devices) to limit LE progression and further functional decline.    Baseline  max a    Time  6    Period  Months    Status  New    Target Date  01/21/18            Plan - 08/05/17 1231    Clinical Impression Statement  MLD to LLE utilizing deep abdominal lymphatics, short neck sequence, functional inguinal LN and popliteal LN in supine. Pt instructed for diaphragmatic breathing, J stroke , and short neck sequence,  Provided skin care to LLE w/ low ph castor oil during MLD, and with low ph Eucerin lotion before applying wraps. Reviewed compression wrapping with Pt again and Pt made video to assist w/ teaching her mother to assist. Cont as per POC.    Occupational Profile and client history currently impacting functional performance  Pt states episodes of severe depression may impact participation in OT    Occupational performance deficits (Please refer to evaluation for details):   ADL's;IADL's;Work;Leisure;Social Participation;Other decreased body image    Rehab Potential  Good    OT Frequency  3x / week    OT Duration  12 weeks    OT Treatment/Interventions  Self-care/ADL training;Therapeutic exercise;Manual lymph drainage;Coping strategies training;Compression bandaging;Patient/family education;Other (comment);Energy conservation;Therapist, nutritional;Therapeutic activities;DME and/or AE instruction;Manual Therapy    Plan  Fit with knee length BLE compression garments for daytime use and knee length HOS device to limit fibrosis formation and improve lymphatic function during HOS.    Clinical Decision Making  Several treatment options, min-mod task modification necessary    Consulted and Agree with Plan of Care  Patient       Patient will benefit from skilled therapeutic intervention in order to improve the following deficits and impairments:  Abnormal gait, Decreased knowledge of use of DME, Decreased skin integrity, Increased edema, Impaired flexibility, Pain, Decreased mobility, Impaired sensation, Decreased coping skills, Decreased range of motion, Decreased activity tolerance, Decreased knowledge of precautions, Difficulty walking, Obesity  Visit Diagnosis: Lymphedema, not elsewhere classified    Problem List Patient Active Problem List   Diagnosis Date Noted  . Eating disorder 05/23/2017  . Abdominal pain, right upper quadrant 05/01/2017  . Gallstones 04/19/2017  . Diabetic polyneuropathy associated with type 2 diabetes mellitus ([place]) 04/07/2017  . Diabetic angiopathy ([place]) 04/07/2017  . Vasomotor symptoms due to  menopause 04/07/2017  . Mobility impaired 11/12/2016  . Diarrhea 09/05/2016  . Urinary incontinence 12/02/2015  . Candidal intertrigo 04/06/2015  . Cystocele 04/06/2015  . Constipation 04/06/2015  . Back pain 12/28/2014  . Hypertriglyceridemia 10/14/2014  . Breast mass in female 07/28/2014  . Fall 07/23/2013  . Great toe pain  05/12/2013  . Medication management 11/20/2012  . Abdominal pain 04/23/2012  . Uric acid kidney stone 11/20/2011  . HYPERHIDROSIS 11/07/2009  . Menopausal symptoms 06/17/2009  . Type 2 diabetes mellitus without complication, without long-term current use of insulin ([place]) 08/23/2008  . VITAMIN D DEFICIENCY 05/07/2008  . VITAMIN B12 DEFICIENCY 06/04/2007  . CHRONIC FATIGUE SYNDROME 11/15/2006  . DIVERTICULITIS, HX OF 11/15/2006  . PCOS (polycystic ovarian syndrome) 10/16/2006  . Hyperlipidemia LDL goal <100 10/16/2006  . Morbid obesity with BMI of 50.0-59.9, adult ([place]) 10/16/2006  . Generalized anxiety disorder 10/16/2006  . PANIC DISORDER 10/16/2006  . BULIMIA 10/16/2006  . Chronic depression 10/16/2006  . CARPAL TUNNEL SYNDROME 10/16/2006  . LYMPHEDEMA 10/16/2006  . Allergic rhinitis 10/16/2006  . Mild intermittent asthma 10/16/2006  . TMJ SYNDROME 10/16/2006  . GERD 10/16/2006  . IRRITABLE BOWEL SYNDROME 10/16/2006  . OSTEOARTHRITIS 10/16/2006  . Myalgia and myositis 10/16/2006  . COUGH, CHRONIC 10/16/2006    Andrey Spearman, MS, OTR/L, [facility] 08/05/17 12:35 PM  [place] MAIN [facility] SERVICES [address] [place], [place], [address] Phone: [phone]   Fax:  [phone]  Name: Mackenzie Bonilla MRN: [id number] Date of Birth: [birth year]

## 2017-08-07 ENCOUNTER — Ambulatory Visit: Payer: Self-pay | Admitting: *Deleted

## 2017-08-08 ENCOUNTER — Ambulatory Visit: Payer: Medicare Other | Admitting: Occupational Therapy

## 2017-08-08 DIAGNOSIS — I890 Lymphedema, not elsewhere classified: Secondary | ICD-10-CM | POA: Diagnosis not present

## 2017-08-08 NOTE — Therapy (Signed)
[place] MAIN [facility] SERVICES [address] [place], [place], [address] Phone: [phone]   Fax:  [phone]  Occupational Therapy Treatment  Patient Details  Name: Mackenzie Bonilla MRN: [id number] Date of Birth: 03-10-1965 No data recorded  Encounter Date: 08/08/2017  OT End of Session - 08/08/17 1734    Visit Number  5    Number of Visits  36    Date for OT Re-Evaluation  10/22/17    OT Start Time  0210    OT Stop Time  0305    OT Time Calculation (min)  55 min    Activity Tolerance  Patient tolerated treatment well;No increased pain    Behavior During Therapy  WFL for tasks assessed/performed       Past Medical History:  Diagnosis Date  . Allergy    allergic rhinitis  . Anemia   . Anxiety   . Arthritis    osteoarthritis  . Asthma   . Claustrophobia    does not like oxygen mask covering face  . Depression   . Diabetes mellitus without complication ([place])   . Fall 2016  . Fatty liver 07/2008   abd. ultrasound - fatty liver ; slt dilated cbd (no stones ) 06/10// abd.  ultrasound normal on 04/2006  . Fibromyalgia   . GERD (gastroesophageal reflux disease)    EGD negative 06/2001// EGD erythematous mucosa, polyp 08/2008  . High uric acid in 24 hour urine specimen   . Hyperhidrosis   . Hyperlipidemia   . Kidney stones   . Lymphedema   . Neuromuscular disorder (HCC)    fibromyalgia  . Obesity   . Shortness of breath dyspnea   . Tachycardia   . TMJ (dislocation of temporomandibular joint)   . UTI (lower urinary tract infection)     Past Surgical History:  Procedure Laterality Date  . BREAST BIOPSY Right 2016   neg  . BREAST LUMPECTOMY WITH RADIOACTIVE SEED LOCALIZATION Right 08/02/2014   Procedure: BREAST LUMPECTOMY WITH RADIOACTIVE SEED LOCALIZATION;  Surgeon: Rolm Bookbinder, MD;  Location: [facility];  Service: General;  Laterality: Right;  . COLONOSCOPY  08/12/2012   Completed by Dr. Phylis Bougie, normal exam.   .  ENDOMETRIAL BIOPSY  01/2004  . ESOPHAGOGASTRODUODENOSCOPY    . FOOT SURGERY    . SEPTOPLASTY     two times  . TONSILLECTOMY    . UTERINE FIBROID SURGERY  06/2005   ablation  . uterine tumor  09/2001  . VAGUS NERVE STIMULATOR INSERTION      There were no vitals filed for this visit.  Subjective Assessment - 08/08/17 1731    Subjective   Ms. Kolker is here for OT visit 4536 for Intensive Phase CDT to BLE, with inital emphasis on LLE. Pt is unaccompanied. Pt presents with compression wraps in place below the knee applied by Pt and mother working together.    Pertinent History  Primary LE dx at age 57 is consistent with LE Praecox, Lymphedema praecox, also known as Meige disease, is the most common form of primary lymphedema. By definition, this disease becomes clinically evident after birth and before age 53 years. L ankle fx 2017, obesity, depression, OA- B knees, chromnic back pain, type II diabetes     Limitations  chronic leg pain and swelling, impaired standing tolerance, difficulty fitting LB clothing and street shoes,     Repetition  Increases Symptoms    Patient Stated Goals  get swelling down and relieve leg  pain so I can do more    Currently in Pain?  Yes leg pain unchanged from itital  eval    Pain Onset  More than a month ago                   OT Treatments/Exercises (OP) - 08/08/17 0001      ADLs   ADL Education Given  Yes      Manual Therapy   Manual Therapy  Edema management;Manual Lymphatic Drainage (MLD);Compression Bandaging    Manual therapy comments  skin care toLLE w/ low ph castor oil during MLD, and with low ph Eucerin lotio0n prior to reapplying wraps.    Manual Lymphatic Drainage (MLD)  MLD to LLE utilizing deep abdominal lymphatics, short neck sequence, functional inguinal LN and popliteal LN in supine. Pt instructed for diaphragmatic breathing, J stroke and sh9ort neck sequence. Handouts given.     Compression Bandaging  4 layer gradient  compressino wrap to LLE             OT Education - 08/08/17 1733    Education Details  Continued skilled Pt/caregiver education  And LE ADL training throughout visit for lymphedema self care/ home program, including compression wrapping, compression garment and device wear/care, lymphatic pumping ther ex, simple self-MLD, and skin care. Discussed progress towards goals.     Person(s) Educated  Patient;Parent(s)    Methods  Explanation;Demonstration;Tactile cues;Verbal cues;Handout    Comprehension  Verbalized understanding;Returned demonstration;Verbal cues required;Tactile cues required;Need further instruction          OT Long Term Goals - 07/25/17 1030      OT LONG TERM GOAL #1   Title  Pt independent w/ lymphedema precautions/prevention principals and using printed reference to limit LE progression and infection risk.    Baseline  Max A    Time  2    Period  Weeks    Status  New    Target Date  -- 10th OT Rx visit      OT LONG TERM GOAL #2   Title  Lymphedema (LE) management/ self-care: Pt able to apply knee  length gradient compression wraps using correct techniques with modified independence ( extra time and assistive device/s PRN)   within 2 weeks to achieve optimal limb volume reduction during Intensive Phase of CDT and optimal self-management over time.    Baseline  max A    Time  2    Period  Weeks    Status  New    Target Date  -- 10th OT Rx visit      OT LONG TERM GOAL #3   Title  Lymphedema (LE) management/ self-care:  Pt to achieve at least 10%  limb volume reductions bilaterally  during Intensive Phase CDT to limit LE progression, to decrease infection risk and to improve functional performance in all occupational domains.     Baseline  max a    Time  12    Period  Weeks    Status  New    Target Date  10/22/17      OT LONG TERM GOAL #4   Title  Lymphedema (LE) management/ self-care:  Pt to tolerate daily compression garments and/ or HOS devices in  keeping w/ prescribed wear regime within 1 week of issue date to restore normal limb shape to reduce tissue density and protein build up leading to progression.    Baseline  max A    Time  12    Period  Weeks    Status  New    Target Date  10/22/17      OT LONG TERM GOAL #5   Title  Lymphedema (LE) Pain: Pt to report 25% reduction in discomfort/ pain described as LE  "heaviness", "fullness", tightness"" by DC to facilitate improved functional ambulation and mobility and increased independence with basic and instrumental ADLs.     Baseline  max A    Time  12    Period  Weeks    Status  New    Target Date  10/22/17      Long Term Additional Goals   Additional Long Term Goals  Yes      OT LONG TERM GOAL #6   Title  Lymphedema (LE) management/ self-care:  During Management Phase CDT Pt to sustain reduced limb volumes achieved during Intensive Phase CDT during Management Phase CDT within 3% using LE self-care home program components as directed ( simple self MLD, skin care, ther ex and daily/ nightly compression garments/ devices) to limit LE progression and further functional decline.    Baseline  max a    Time  6    Period  Months    Status  New    Target Date  01/21/18            Plan - 08/08/17 1734    Clinical Impression Statement  Pt and mother worked together during visit interval to apply compression wraps below the knee. Althouigh they did not achieve perfection, they did a really nice job applying wraps. I encouraged Pt to continue wrapping dail;y between sessions in oreder to achieve optimal clinical outcome for LE management. Pt tolerated MLD and compression wrapping in clin8ic today without difficulty. Cont as per POC.    Occupational Profile and client history currently impacting functional performance  Pt states episodes of severe depression may impact participation in OT    Occupational performance deficits (Please refer to evaluation for details):   ADL's;IADL's;Work;Leisure;Social Participation;Other decreased body image    Rehab Potential  Good    OT Frequency  3x / week    OT Duration  12 weeks    OT Treatment/Interventions  Self-care/ADL training;Therapeutic exercise;Manual lymph drainage;Coping strategies training;Compression bandaging;Patient/family education;Other (comment);Energy conservation;Therapist, nutritional;Therapeutic activities;DME and/or AE instruction;Manual Therapy    Plan  Fit with knee length BLE compression garments for daytime use and knee length HOS device to limit fibrosis formation and improve lymphatic function during HOS.    Clinical Decision Making  Several treatment options, min-mod task modification necessary    Consulted and Agree with Plan of Care  Patient       Patient will benefit from skilled therapeutic intervention in order to improve the following deficits and impairments:  Abnormal gait, Decreased knowledge of use of DME, Decreased skin integrity, Increased edema, Impaired flexibility, Pain, Decreased mobility, Impaired sensation, Decreased coping skills, Decreased range of motion, Decreased activity tolerance, Decreased knowledge of precautions, Difficulty walking, Obesity  Visit Diagnosis: Lymphedema, not elsewhere classified    Problem List Patient Active Problem List   Diagnosis Date Noted  . Eating disorder 05/23/2017  . Abdominal pain, right upper quadrant 05/01/2017  . Gallstones 04/19/2017  . Diabetic polyneuropathy associated with type 2 diabetes mellitus ([place]) 04/07/2017  . Diabetic angiopathy ([place]) 04/07/2017  . Vasomotor symptoms due to menopause 04/07/2017  . Mobility impaired 11/12/2016  . Diarrhea 09/05/2016  . Urinary incontinence 12/02/2015  . Candidal intertrigo 04/06/2015  . Cystocele 04/06/2015  . Constipation 04/06/2015  .  Back pain 12/28/2014  . Hypertriglyceridemia 10/14/2014  . Breast mass in female 07/28/2014  . Fall 07/23/2013  . Great toe pain  05/12/2013  . Medication management 11/20/2012  . Abdominal pain 04/23/2012  . Uric acid kidney stone 11/20/2011  . HYPERHIDROSIS 11/07/2009  . Menopausal symptoms 06/17/2009  . Type 2 diabetes mellitus without complication, without long-term current use of insulin ([place]) 08/23/2008  . VITAMIN D DEFICIENCY 05/07/2008  . VITAMIN B12 DEFICIENCY 06/04/2007  . CHRONIC FATIGUE SYNDROME 11/15/2006  . DIVERTICULITIS, HX OF 11/15/2006  . PCOS (polycystic ovarian syndrome) 10/16/2006  . Hyperlipidemia LDL goal <100 10/16/2006  . Morbid obesity with BMI of 50.0-59.9, adult ([place]) 10/16/2006  . Generalized anxiety disorder 10/16/2006  . PANIC DISORDER 10/16/2006  . BULIMIA 10/16/2006  . Chronic depression 10/16/2006  . CARPAL TUNNEL SYNDROME 10/16/2006  . LYMPHEDEMA 10/16/2006  . Allergic rhinitis 10/16/2006  . Mild intermittent asthma 10/16/2006  . TMJ SYNDROME 10/16/2006  . GERD 10/16/2006  . IRRITABLE BOWEL SYNDROME 10/16/2006  . OSTEOARTHRITIS 10/16/2006  . Myalgia and myositis 10/16/2006  . COUGH, CHRONIC 10/16/2006    Andrey Spearman, MS, OTR/L, [facility] 08/08/17 5:38 PM  [place] MAIN [facility] SERVICES [address] [place], [place], [address] Phone: [phone]   Fax:  [phone]  Name: Mackenzie Bonilla MRN: [id number] Date of Birth: 12-23-1965

## 2017-08-12 ENCOUNTER — Encounter: Payer: Medicare Other | Admitting: Occupational Therapy

## 2017-08-12 ENCOUNTER — Ambulatory Visit: Payer: Medicare Other | Admitting: Family Medicine

## 2017-08-12 ENCOUNTER — Encounter: Payer: Self-pay | Admitting: Family Medicine

## 2017-08-12 VITALS — BP 128/84 | HR 102 | Temp 98.5°F | Ht 64.0 in | Wt 333.2 lb

## 2017-08-12 DIAGNOSIS — B372 Candidiasis of skin and nail: Secondary | ICD-10-CM | POA: Diagnosis not present

## 2017-08-12 MED ORDER — KETOCONAZOLE 2 % EX CREA: 1.0000 "application " | TOPICAL_CREAM | Freq: Every day | CUTANEOUS | 1 refills | Status: DC

## 2017-08-12 NOTE — Assessment & Plan Note (Signed)
Again acute on chronic under L pannus  Erythema/ satellite lesions indicate yeast Disc imp of cleaning and drying very well  Ketoconazole px given  Has nystatin powder to use when it calms down Update if worse or not starting to improve in the next week  Wt loss would help Also having lymphedema treated

## 2017-08-12 NOTE — Patient Instructions (Signed)
Keep rash area clean and dry  If you want to use soap - try dove for sensitive skin  If you can use cool air/hair dryer to get very dry with bathing -do that  Otherwise just dry very well   Let's try ketoconazole cream once daily - after bathing and drying  Once rash settles down- can go back to the powder (when no longer burning and uncomfortable)   Alert me in 1-2 weeks if not improving

## 2017-08-12 NOTE — Progress Notes (Signed)
Subjective:    Patient ID: Mackenzie Bonilla, female    DOB: [birth year], 52 y.o.   MRN: [id number]  HPI  Here for skin tag Also a rash   Skin has been burning  Cleaning with warm wash cloth and water  Uses cold and warm compresses on and off (cold feels better)  Using mycostatin powder  (had a cream- was more wet) miconazole cream   Friction is worse- leg work in PT for lymphedema and also balance/ ankle strength  Wrapping is helping the edema    Wt Readings from Last 3 Encounters:  08/12/17 (!) 333 lb 4 oz (151.2 kg)  06/11/17 (!) 332 lb (150.6 kg)  05/21/17 (!) 326 lb 3.2 oz (148 kg)   57.20 kg/m    Patient Active Problem List   Diagnosis Date Noted  . Eating disorder 05/23/2017  . Abdominal pain, right upper quadrant 05/01/2017  . Gallstones 04/19/2017  . Diabetic polyneuropathy associated with type 2 diabetes mellitus ([place]) 04/07/2017  . Diabetic angiopathy ([place]) 04/07/2017  . Vasomotor symptoms due to menopause 04/07/2017  . Mobility impaired 11/12/2016  . Diarrhea 09/05/2016  . Urinary incontinence 12/02/2015  . Candidal intertrigo 04/06/2015  . Cystocele 04/06/2015  . Constipation 04/06/2015  . Back pain 12/28/2014  . Hypertriglyceridemia 10/14/2014  . Breast mass in female 07/28/2014  . Fall 07/23/2013  . Great toe pain 05/12/2013  . Medication management 11/20/2012  . Abdominal pain 04/23/2012  . Uric acid kidney stone 11/20/2011  . HYPERHIDROSIS 11/07/2009  . Menopausal symptoms 06/17/2009  . Type 2 diabetes mellitus without complication, without long-term current use of insulin ([place]) 08/23/2008  . VITAMIN D DEFICIENCY 05/07/2008  . VITAMIN B12 DEFICIENCY 06/04/2007  . CHRONIC FATIGUE SYNDROME 11/15/2006  . DIVERTICULITIS, HX OF 11/15/2006  . PCOS (polycystic ovarian syndrome) 10/16/2006  . Hyperlipidemia LDL goal <100 10/16/2006  . Morbid obesity with BMI of 50.0-59.9, adult ([place]) 10/16/2006  . Generalized anxiety disorder 10/16/2006  . PANIC  DISORDER 10/16/2006  . BULIMIA 10/16/2006  . Chronic depression 10/16/2006  . CARPAL TUNNEL SYNDROME 10/16/2006  . LYMPHEDEMA 10/16/2006  . Allergic rhinitis 10/16/2006  . Mild intermittent asthma 10/16/2006  . TMJ SYNDROME 10/16/2006  . GERD 10/16/2006  . IRRITABLE BOWEL SYNDROME 10/16/2006  . OSTEOARTHRITIS 10/16/2006  . Myalgia and myositis 10/16/2006  . COUGH, CHRONIC 10/16/2006   Past Medical History:  Diagnosis Date  . Allergy    allergic rhinitis  . Anemia   . Anxiety   . Arthritis    osteoarthritis  . Asthma   . Claustrophobia    does not like oxygen mask covering face  . Depression   . Diabetes mellitus without complication ([place])   . Fall 2016  . Fatty liver 07/2008   abd. ultrasound - fatty liver ; slt dilated cbd (no stones ) 06/10// abd.  ultrasound normal on 04/2006  . Fibromyalgia   . GERD (gastroesophageal reflux disease)    EGD negative 06/2001// EGD erythematous mucosa, polyp 08/2008  . High uric acid in 24 hour urine specimen   . Hyperhidrosis   . Hyperlipidemia   . Kidney stones   . Lymphedema   . Neuromuscular disorder (HCC)    fibromyalgia  . Obesity   . Shortness of breath dyspnea   . Tachycardia   . TMJ (dislocation of temporomandibular joint)   . UTI (lower urinary tract infection)    Past Surgical History:  Procedure Laterality Date  . BREAST BIOPSY Right 2016   neg  .  BREAST LUMPECTOMY WITH RADIOACTIVE SEED LOCALIZATION Right 08/02/2014   Procedure: BREAST LUMPECTOMY WITH RADIOACTIVE SEED LOCALIZATION;  Surgeon: Rolm Bookbinder, MD;  Location: [facility];  Service: General;  Laterality: Right;  . COLONOSCOPY  08/12/2012   Completed by Dr. Phylis Bougie, normal exam.   . ENDOMETRIAL BIOPSY  01/2004  . ESOPHAGOGASTRODUODENOSCOPY    . FOOT SURGERY    . SEPTOPLASTY     two times  . TONSILLECTOMY    . UTERINE FIBROID SURGERY  06/2005   ablation  . uterine tumor  09/2001  . VAGUS NERVE STIMULATOR INSERTION     Social History   Tobacco Use    . Smoking status: Never Smoker  . Smokeless tobacco: Never Used  Substance Use Topics  . Alcohol use: No    Alcohol/week: 0.0 oz  . Drug use: No   Family History  Problem Relation Age of Onset  . Hypertension Father   . Cancer Father        pancreatic cancer  . Hypertension Sister   . Heart disease Maternal Grandmother 60       MI  . Diabetes Maternal Grandmother   . Heart disease Paternal Grandmother 46       MI  . Diabetes Paternal Grandmother   . Breast cancer Neg Hx    Allergies  Allergen Reactions  . Cephalexin   . Codeine     REACTION: ? reaction  . Duloxetine   . Levaquin [Levofloxacin] Nausea Only  . Lisinopril Cough  . Metformin And Related Diarrhea    GI side effects   . Prednisone     Tolerates intraarticular depo-medrol.  . Quetiapine Other (See Comments)  . Sertraline Hcl     REACTION: vivid dreams  . Simvastatin Other (See Comments)    achey  . Triazolam     REACTION: ? reaction  . Zaleplon   . Zocor [Simvastatin - High Dose]     achey  . Zolpidem Tartrate     hallucinations  . Hydrocodone Rash    REACTION: ? reaction REACTION: ? reaction  . Norelgestromin-Eth Estradiol     Patch didn't stick to skin   Current Outpatient Medications on File Prior to Visit  Medication Sig Dispense Refill  . ACCU-CHEK AVIVA PLUS test strip Use to check blood sugar  once a day 100 each 0  . ACCU-CHEK SOFTCLIX LANCETS lancets Use to check blood sugar  once a day 100 each 0  . acetaminophen (TYLENOL) 500 MG tablet Take 500 mg by mouth every 6 (six) hours as needed.     Marland Kitchen albuterol (PROVENTIL HFA;VENTOLIN HFA) 108 (90 Base) MCG/ACT inhaler Inhale 2 puffs into the lungs every 4 (four) hours as needed for wheezing. 1 Inhaler 3  . ALPRAZolam (XANAX) 1 MG tablet Take 2 mg by mouth 3 (three) times daily as needed.     . benzonatate (TESSALON) 200 MG capsule TAKE 1 CAPSULES BY MOUTH 3 TIMES DAILY AS NEEDED FOR COUGH 90 capsule 2  . Blood Glucose Calibration (ACCU-CHEK  AVIVA) SOLN     . buPROPion (WELLBUTRIN XL) 150 MG 24 hr tablet     . Cetirizine HCl 10 MG CAPS Take 1 capsule by mouth daily.    . cholecalciferol (VITAMIN D) 1000 units tablet Take 5,000 Units by mouth daily.    . cyclobenzaprine (FLEXERIL) 5 MG tablet TAKE ONE TABLET THREE TIMES A DAY IF NEEDED FOR MUSCLE SPASM 30 tablet 3  . dicyclomine (BENTYL) 10 MG capsule Take 1 capsule (  10 mg total) by mouth 4 (four) times daily -  before meals and at bedtime. 120 capsule 2  . dimenhyDRINATE (DRAMAMINE) 50 MG tablet Take 50 mg by mouth every 8 (eight) hours as needed.    . DULoxetine (CYMBALTA) 60 MG capsule Take 60 mg by mouth daily.    . Ergocalciferol (VITAMIN D2 PO) Take by mouth once a week.    Marland Kitchen FLUoxetine (PROZAC) 40 MG capsule Take 80 mg by mouth daily.    . fluticasone (FLONASE ALLERGY RELIEF) 50 MCG/ACT nasal spray Place into both nostrils daily.    . furosemide (LASIX) 20 MG tablet Take 1 tablet (20 mg total) by mouth daily. Prn edema 30 tablet 3  . gabapentin (NEURONTIN) 100 MG capsule Take 2 capsules (200 mg total) by mouth 2 (two) times daily. 360 capsule 3  . gabapentin (NEURONTIN) 300 MG capsule Take 1 capsule (300 mg total) by mouth at bedtime. 90 capsule 3  . glipiZIDE (GLUCOTROL XL) 5 MG 24 hr tablet Take 5 mg by mouth daily with breakfast.    . ibuprofen (ADVIL,MOTRIN) 600 MG tablet Take 1 tablet (600 mg total) by mouth every 8 (eight) hours as needed. 30 tablet 0  . loratadine (CLARITIN) 10 MG tablet Take by mouth.    . losartan (COZAAR) 25 MG tablet     . nystatin (MYCOSTATIN/NYSTOP) powder Apply topically 2 (two) times daily. To affected areas of yeast skin infection (between skin folds) 60 g 3  . omeprazole (PRILOSEC) 20 MG capsule TAKE 1 CAPSULE BY MOUTH TWO TIMES DAILY 180 capsule 1  . oxybutynin (DITROPAN XL) 15 MG 24 hr tablet TAKE ONE TABLET BY MOUTH EVERY DAY 90 tablet 0  . pravastatin (PRAVACHOL) 20 MG tablet TAKE 1 TABLET BY MOUTH NIGHTLY 30 tablet 5  . Simethicone  (GAS-X PO) Take by mouth.    . spironolactone (ALDACTONE) 100 MG tablet Take 1 tablet (100 mg total) by mouth daily. 30 tablet 3  . talc (ZEASORB) powder Apply topically as needed. (Patient taking differently: Apply 1 application topically daily as needed (sweat). ) 240 g 3  . tamsulosin (FLOMAX) 0.4 MG CAPS capsule Take 1 capsule (0.4 mg total) by mouth daily. 30 capsule 0  . traMADol (ULTRAM) 50 MG tablet Take 1 tablet (50 mg total) by mouth 2 (two) times daily as needed. 30 tablet 0  . traZODone (DESYREL) 150 MG tablet Take 450 mg by mouth at bedtime.     . TRULICITY 1.5 CW/2.3JS SOPN INJECT CONTENTS OF 1 PEN ONCE A WEEK 2 mL 3  . vitamin B-12 (CYANOCOBALAMIN) 1000 MCG tablet Take 1,000 mcg by mouth daily.     No current facility-administered medications on file prior to visit.     Review of Systems  Constitutional: Positive for fatigue. Negative for activity change, appetite change, fever and unexpected weight change.  HENT: Negative for congestion, rhinorrhea, sore throat and trouble swallowing.   Eyes: Negative for pain, redness, itching and visual disturbance.  Respiratory: Negative for cough, chest tightness, shortness of breath and wheezing.   Cardiovascular: Negative for chest pain and palpitations.  Gastrointestinal: Positive for diarrhea and nausea. Negative for abdominal pain, blood in stool and constipation.  Endocrine: Negative for cold intolerance, heat intolerance, polydipsia and polyuria.  Genitourinary: Negative for difficulty urinating, dysuria, frequency and urgency.  Musculoskeletal: Positive for arthralgias and myalgias. Negative for joint swelling.  Skin: Positive for rash. Negative for pallor.  Neurological: Negative for dizziness, tremors, weakness, numbness and headaches.  Hematological:  Negative for adenopathy. Does not bruise/bleed easily.  Psychiatric/Behavioral: Positive for dysphoric mood. Negative for decreased concentration. The patient is nervous/anxious.          Objective:   Physical Exam  Constitutional: She is oriented to person, place, and time. She appears well-developed and well-nourished. No distress.  Morbidly obese and well app  HENT:  Head: Normocephalic and atraumatic.  Mod soft cerumen in R ear   Eyes: Pupils are equal, round, and reactive to light. Conjunctivae and EOM are normal.  Neck: Normal range of motion. Neck supple.  Cardiovascular: Normal rate, regular rhythm and normal heart sounds.  Pulmonary/Chest: Effort normal and breath sounds normal.  Abdominal: Soft. Bowel sounds are normal. She exhibits no distension. There is no tenderness.  Musculoskeletal: She exhibits edema.  Lymphedema is improved from baseline-both legs   Lymphadenopathy:    She has no cervical adenopathy.  Neurological: She is alert and oriented to person, place, and time. She displays normal reflexes. No cranial nerve deficit. Coordination normal.  Skin: Skin is warm and dry. Rash noted. She is not diaphoretic.  Intertrigo under L side of pannus- erythema with scalloped edge and some satellite lesions No skin breakdown  Slight odor  No swelling/oozing   No other areas involved   Psychiatric:  pleasant          Assessment & Plan:   Problem List Items Addressed This Visit      Musculoskeletal and Integument   Candidal intertrigo - Primary    Again acute on chronic under L pannus  Erythema/ satellite lesions indicate yeast Disc imp of cleaning and drying very well  Ketoconazole px given  Has nystatin powder to use when it calms down Update if worse or not starting to improve in the next week  Wt loss would help Also having lymphedema treated        Relevant Medications   ketoconazole (NIZORAL) 2 % cream

## 2017-08-13 ENCOUNTER — Ambulatory Visit: Payer: Medicare Other | Attending: Podiatry | Admitting: Occupational Therapy

## 2017-08-13 ENCOUNTER — Encounter: Payer: Medicare Other | Attending: Family Medicine | Admitting: *Deleted

## 2017-08-13 DIAGNOSIS — Z713 Dietary counseling and surveillance: Secondary | ICD-10-CM | POA: Diagnosis not present

## 2017-08-13 DIAGNOSIS — E119 Type 2 diabetes mellitus without complications: Secondary | ICD-10-CM | POA: Insufficient documentation

## 2017-08-13 DIAGNOSIS — Z6841 Body Mass Index (BMI) 40.0 and over, adult: Secondary | ICD-10-CM | POA: Insufficient documentation

## 2017-08-13 DIAGNOSIS — F509 Eating disorder, unspecified: Secondary | ICD-10-CM | POA: Diagnosis not present

## 2017-08-13 DIAGNOSIS — I890 Lymphedema, not elsewhere classified: Secondary | ICD-10-CM | POA: Diagnosis not present

## 2017-08-13 NOTE — Progress Notes (Signed)
Appointment start time: 1100  Appointment end time: 1200  Patient was seen on 08/13/17 for nutrition counseling pertaining to disordered eating  Primary care provider: Dr. Glori Bickers Therapist: chuck burnett   Assessment Not sleeping great due to pain.  Has another yeast infection on her skin Weight is fluctuating... Could it be fluid retention?  Is struggling a good deal with pain and edema.   Is too tired to read so hasn't read anything else  Thinks she still is not eating enough and stomach is still bothering her Getting takeout and eats half at a time  Not binging as much  Dietary assessment: A typical day consists of 2-3 meals and several snacks   Safe foods include: fast food- taco bell, fried shrimp Avoided foods include:many  24 hour recall:  1/2 Chicken salad sandwich from [employer].  Cookie 1/2 chicken salad sandwich doritos   Nutrition Diagnosis: NB-1.5 Disordered eating pattern As related to meal skipping and reported binges.  As evidenced by self report.  Intervention/Goals: Nutrition counseling provided.  Listen to [employer] podcast  Goals are: having 3 meals a day as a start. Suggested carb, protein, and fat with all three meals.    Monitoring and Evaluation: Patient will follow up in 1 weeks.

## 2017-08-14 NOTE — Therapy (Signed)
[place] MAIN [facility] SERVICES [address] [place], [place], [address] Phone: [phone]   Fax:  [phone]  Occupational Therapy Treatment  Patient Details  Name: Mackenzie Bonilla MRN: [id number] Date of Birth: 1965/06/26 No data recorded  Encounter Date: 08/13/2017  OT End of Session - 08/14/17 1131    Visit Number  6    Number of Visits  36    Date for OT Re-Evaluation  10/22/17    OT Start Time  0207    OT Stop Time  0308    OT Time Calculation (min)  61 min    Activity Tolerance  Patient tolerated treatment well;No increased pain    Behavior During Therapy  WFL for tasks assessed/performed       Past Medical History:  Diagnosis Date  . Allergy    allergic rhinitis  . Anemia   . Anxiety   . Arthritis    osteoarthritis  . Asthma   . Claustrophobia    does not like oxygen mask covering face  . Depression   . Diabetes mellitus without complication ([place])   . Fall 2016  . Fatty liver 07/2008   abd. ultrasound - fatty liver ; slt dilated cbd (no stones ) 06/10// abd.  ultrasound normal on 04/2006  . Fibromyalgia   . GERD (gastroesophageal reflux disease)    EGD negative 06/2001// EGD erythematous mucosa, polyp 08/2008  . High uric acid in 24 hour urine specimen   . Hyperhidrosis   . Hyperlipidemia   . Kidney stones   . Lymphedema   . Neuromuscular disorder (HCC)    fibromyalgia  . Obesity   . Shortness of breath dyspnea   . Tachycardia   . TMJ (dislocation of temporomandibular joint)   . UTI (lower urinary tract infection)     Past Surgical History:  Procedure Laterality Date  . BREAST BIOPSY Right 2016   neg  . BREAST LUMPECTOMY WITH RADIOACTIVE SEED LOCALIZATION Right 08/02/2014   Procedure: BREAST LUMPECTOMY WITH RADIOACTIVE SEED LOCALIZATION;  Surgeon: Rolm Bookbinder, MD;  Location: [facility];  Service: General;  Laterality: Right;  . COLONOSCOPY  08/12/2012   Completed by Dr. Phylis Bougie, normal exam.   .  ENDOMETRIAL BIOPSY  01/2004  . ESOPHAGOGASTRODUODENOSCOPY    . FOOT SURGERY    . SEPTOPLASTY     two times  . TONSILLECTOMY    . UTERINE FIBROID SURGERY  06/2005   ablation  . uterine tumor  09/2001  . VAGUS NERVE STIMULATOR INSERTION      There were no vitals filed for this visit.  Subjective Assessment - 08/13/17 1131    Subjective   Ms. Lehan is here for OT visit 6/36 for Intensive Phase CDT to BLE, with inital emphasis on LLE. Pt is unaccompanied. Pt presents with compression wraps in place below the knee . Pt reports she wrapped herself   yesterday. Pt is tearful today reporting increased body ached after PT session yesterday..    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 By definition, this disease becomes clinically evident after birth and before age 38 years. L ankle fx 2017, obesity, depression, OA- B knees, chromnic back pain, type II diabetes     Limitations  chronic leg pain and swelling, impaired standing tolerance, difficulty fitting LB clothing and street shoes,     Repetition  Increases  Symptoms    Patient Stated Goals  get swelling down and relieve leg pain so I can do more    Currently in Pain?  Yes not rated numerically- generalized muscle pain in legs and hips    Pain Descriptors / Indicators  Aching;Heaviness;Sore;Crying;Tender;Discomfort;Tightness;Dull;Tiring    Pain Type  Chronic pain;Acute pain    Pain Onset  In the past 7 days    Aggravating Factors   PT                   OT Treatments/Exercises (OP) - 08/14/17 0001      ADLs   ADL Education Given  Yes      Manual Therapy   Manual Therapy  Edema management;Manual Lymphatic Drainage (MLD);Compression Bandaging    Manual therapy comments  skin care toLLE w/ low ph castor oil during MLD, and with low ph Eucerin lotio0n prior to reapplying wraps.    Manual Lymphatic  Drainage (MLD)  MLD to LLE utilizing deep abdominal lymphatics, short neck sequence, functional inguinal LN and popliteal LN in supine. Pt instructed for diaphragmatic breathing, J stroke and sh9ort neck sequence. Handouts given.     Compression Bandaging  4 layer gradient compressino wrap to LLE             OT Education - 08/14/17 1135    Education Details  Cont Pt edu for self compression wrapping     Person(s) Educated  Patient;Parent(s)    Methods  Explanation;Demonstration;Tactile cues;Verbal cues;Handout    Comprehension  Verbalized understanding;Returned demonstration;Verbal cues required;Tactile cues required;Need further instruction          OT Long Term Goals - 07/25/17 1030      OT LONG TERM GOAL #1   Title  Pt independent w/ lymphedema precautions/prevention principals and using printed reference to limit LE progression and infection risk.    Baseline  Max A    Time  2    Period  Weeks    Status  New    Target Date  -- 10th OT Rx visit      OT LONG TERM GOAL #2   Title  Lymphedema (LE) management/ self-care: Pt able to apply knee  length gradient compression wraps using correct techniques with modified independence ( extra time and assistive device/s PRN)   within 2 weeks to achieve optimal limb volume reduction during Intensive Phase of CDT and optimal self-management over time.    Baseline  max A    Time  2    Period  Weeks    Status  New    Target Date  -- 10th OT Rx visit      OT LONG TERM GOAL #3   Title  Lymphedema (LE) management/ self-care:  Pt to achieve at least 10%  limb volume reductions bilaterally  during Intensive Phase CDT to limit LE progression, to decrease infection risk and to improve functional performance in all occupational domains.     Baseline  max a    Time  12    Period  Weeks    Status  New    Target Date  10/22/17      OT LONG TERM GOAL #4   Title  Lymphedema (LE) management/ self-care:  Pt to tolerate daily compression  garments and/ or HOS devices in keeping w/ prescribed wear regime within 1 week of issue date to restore normal limb shape to reduce tissue density and protein build up leading to progression.    Baseline  max A    Time  12    Period  Weeks    Status  New    Target Date  10/22/17      OT LONG TERM GOAL #5   Title  Lymphedema (LE) Pain: Pt to report 25% reduction in discomfort/ pain described as LE  "heaviness", "fullness", tightness"" by DC to facilitate improved functional ambulation and mobility and increased independence with basic and instrumental ADLs.     Baseline  max A    Time  12    Period  Weeks    Status  New    Target Date  10/22/17      Long Term Additional Goals   Additional Long Term Goals  Yes      OT LONG TERM GOAL #6   Title  Lymphedema (LE) management/ self-care:  During Management Phase CDT Pt to sustain reduced limb volumes achieved during Intensive Phase CDT during Management Phase CDT within 3% using LE self-care home program components as directed ( simple self MLD, skin care, ther ex and daily/ nightly compression garments/ devices) to limit LE progression and further functional decline.    Baseline  max a    Time  6    Period  Months    Status  New    Target Date  01/21/18            Plan - 08/14/17 1135    Clinical Impression Statement  Pt having tough time coping with demands of PT and rigorous OT for lymphedema care simultaneously. Pt reporting increased muscle ached has limited mobility and activity level over the past 24 hours.Provided introductory conceps for Mindfulness as coping strategy and stress relef. Pt tolerated MLD without increased pain. Pt able to apply compression wraps from foot to knee with omax A and verbal & tactile cues. Cont as per POC. Continue to support Pt in her efforts to proactively address her health and functional goals.    Occupational Profile and client history currently impacting functional performance  Pt states episodes  of severe depression may impact participation in OT    Occupational performance deficits (Please refer to evaluation for details):  ADL's;IADL's;Work;Leisure;Social Participation;Other decreased body image    Rehab Potential  Good    OT Frequency  3x / week    OT Duration  12 weeks    OT Treatment/Interventions  Self-care/ADL training;Therapeutic exercise;Manual lymph drainage;Coping strategies training;Compression bandaging;Patient/family education;Other (comment);Energy conservation;Therapist, nutritional;Therapeutic activities;DME and/or AE instruction;Manual Therapy    Plan  Fit with knee length BLE compression garments for daytime use and knee length HOS device to limit fibrosis formation and improve lymphatic function during HOS.    Clinical Decision Making  Several treatment options, min-mod task modification necessary    Consulted and Agree with Plan of Care  Patient       Patient will benefit from skilled therapeutic intervention in order to improve the following deficits and impairments:  Abnormal gait, Decreased knowledge of use of DME, Decreased skin integrity, Increased edema, Impaired flexibility, Pain, Decreased mobility, Impaired sensation, Decreased coping skills, Decreased range of motion, Decreased activity tolerance, Decreased knowledge of precautions, Difficulty walking, Obesity  Visit Diagnosis: Lymphedema, not elsewhere classified    Problem List Patient Active Problem List   Diagnosis Date Noted  . Eating disorder 05/23/2017  . Abdominal pain, right upper quadrant 05/01/2017  . Gallstones 04/19/2017  . Diabetic polyneuropathy associated with type 2 diabetes mellitus ([place]) 04/07/2017  . Diabetic angiopathy ([place]) 04/07/2017  .  Vasomotor symptoms due to menopause 04/07/2017  . Mobility impaired 11/12/2016  . Diarrhea 09/05/2016  . Urinary incontinence 12/02/2015  . Candidal intertrigo 04/06/2015  . Cystocele 04/06/2015  . Constipation 04/06/2015  . Back  pain 12/28/2014  . Hypertriglyceridemia 10/14/2014  . Breast mass in female 07/28/2014  . Fall 07/23/2013  . Great toe pain 05/12/2013  . Medication management 11/20/2012  . Abdominal pain 04/23/2012  . Uric acid kidney stone 11/20/2011  . HYPERHIDROSIS 11/07/2009  . Menopausal symptoms 06/17/2009  . Type 2 diabetes mellitus without complication, without long-term current use of insulin ([place]) 08/23/2008  . VITAMIN D DEFICIENCY 05/07/2008  . VITAMIN B12 DEFICIENCY 06/04/2007  . CHRONIC FATIGUE SYNDROME 11/15/2006  . DIVERTICULITIS, HX OF 11/15/2006  . PCOS (polycystic ovarian syndrome) 10/16/2006  . Hyperlipidemia LDL goal <100 10/16/2006  . Morbid obesity with BMI of 50.0-59.9, adult ([place]) 10/16/2006  . Generalized anxiety disorder 10/16/2006  . PANIC DISORDER 10/16/2006  . BULIMIA 10/16/2006  . Chronic depression 10/16/2006  . CARPAL TUNNEL SYNDROME 10/16/2006  . LYMPHEDEMA 10/16/2006  . Allergic rhinitis 10/16/2006  . Mild intermittent asthma 10/16/2006  . TMJ SYNDROME 10/16/2006  . GERD 10/16/2006  . IRRITABLE BOWEL SYNDROME 10/16/2006  . OSTEOARTHRITIS 10/16/2006  . Myalgia and myositis 10/16/2006  . COUGH, CHRONIC 10/16/2006    Andrey Spearman, MS, OTR/L, [facility] 08/14/17 11:41 AM  [place] MAIN [facility] SERVICES [address] [place], [place], [address] Phone: [phone]   Fax:  [phone]  Name: EVANELL REDLICH MRN: [id number] Date of Birth: 1965/08/27

## 2017-08-19 ENCOUNTER — Telehealth: Payer: Self-pay | Admitting: *Deleted

## 2017-08-19 ENCOUNTER — Ambulatory Visit: Payer: Medicare Other | Admitting: Internal Medicine

## 2017-08-19 ENCOUNTER — Encounter: Payer: Self-pay | Admitting: Internal Medicine

## 2017-08-19 VITALS — BP 124/80 | HR 101 | Temp 97.8°F | Wt 332.0 lb

## 2017-08-19 DIAGNOSIS — B372 Candidiasis of skin and nail: Secondary | ICD-10-CM

## 2017-08-19 MED ORDER — FLUCONAZOLE 150 MG PO TABS: 150.0000 mg | ORAL_TABLET | Freq: Once | ORAL | 0 refills | Status: DC

## 2017-08-19 MED ORDER — FLUCONAZOLE 150 MG PO TABS: 150.0000 mg | ORAL_TABLET | ORAL | 0 refills | Status: DC

## 2017-08-19 NOTE — Patient Instructions (Signed)
Intertrigo Intertrigo is skin irritation (inflammation) that happens in warm, moist areas of the body. The irritation can cause a rash and make skin raw and itchy. The rash is usually pink or red. It happens mostly between folds of skin or where skin rubs together, such as:  Toes.  Armpits.  Groin.  Belly.  Breasts.  Buttocks.  This condition is not passed from person to person (is not contagious). Follow these instructions at home:  Keep the affected area clean and dry.  Do not scratch your skin.  Stay cool as much as possible. Use an air conditioner or fan, if you can.  Apply over-the-counter and prescription medicines only as told by your doctor.  If you were prescribed an antibiotic medicine, use it as told by your doctor. Do not stop using the antibiotic even if your condition starts to get better.  Keep all follow-up visits as told by your doctor. This is important. How is this prevented?  Stay at a healthy weight.  Keep your feet dry. This is very important if you have diabetes. Wear cotton or wool socks.  Take care of and protect the skin in your groin and butt area as told by your doctor.  Do not wear tight clothes. Wear clothes that: ? Are loose. ? Take away moisture from your body. ? Are made of cotton.  Wear a bra that gives good support, if needed.  Shower and dry yourself fully after being active.  Keep your blood sugar under control if you have diabetes. Contact a doctor if:  Your symptoms do not get better with treatment.  Your symptoms get worse or they spread.  You notice more redness and warmth.  You have a fever. This information is not intended to replace advice given to you by your health care provider. Make sure you discuss any questions you have with your health care provider. Document Released: 03/03/2010 Document Revised: 07/07/2015 Document Reviewed: 08/02/2014 Elsevier Interactive Patient Education  2018 Elsevier Inc.  

## 2017-08-19 NOTE — Telephone Encounter (Signed)
[facility] Patient Name: Mackenzie Bonilla Gender: Female DOB: 09/22/1965 Age: 52 Y 28 M Return Phone Number: [phone] (Primary), [phone] (Secondary) Address: City/State/Zip: [place] [address] Client [facility] Night - Client Client Site [facility], Roque Lias - MD Contact Type Call Who Is Calling Patient / Member / Family / Caregiver Call Type Triage / Clinical Relationship To Patient Self Return Phone Number [phone] (Primary) Chief Complaint Breast Symptoms Reason for Call Request to Schedule Office Appointment Initial Comment Caller states she is needing an appointment about an yeast infection which has gotten worse and is spreading on her leg and under the right breast. She is in pain and it has an odor. She is wanting an appointment. Translation No Nurse Assessment Nurse: Ward, RN, Corene Cornea Date/Time (Eastern Time): 08/18/2017 4:09:25 PM Confirm and document reason for call. If symptomatic, describe symptoms. ---Caller states she is needing an appointment about an yeast infection which has gotten worse and is spreading on her leg and under the right breast. She is in pain and it has an odor. Saw MD last week and was told to call back if she became worse. Denies need for assessment. Wants to call PCP tomorrow. Does the patient have any new or worsening symptoms? ---No Please document clinical information provided and list any resource used. ---Patient wants to wait to speak to PCP. Offered SMO Diflucan and she wanted to wait until tomorrow. Guidelines Guideline Title Affirmed Question Affirmed Notes Nurse Date/Time (Eastern Time) Disp. Time Eilene Ghazi Time) Disposition Final User 08/18/2017 4:22:02 PM Clinical Call Yes Ward, RN, Rennis Golden Disagree/Comply Comply Caller Understands Yes PreDisposition  Home Care Referrals REFERRED TO PCP OFFICE

## 2017-08-19 NOTE — Progress Notes (Signed)
Subjective:    Patient ID: Mackenzie Bonilla, female    DOB: [birth year], 52 y.o.   MRN: [id number]  HPI  Pt presents to the clinic today with c/o worsening rash under right breast, left groin area. She was seen 7/1 for the same, diagnosed with Candida Intertrigo, treated with Nystatin cream. She reports the area feels good after she uses the cream but the then the rash gets irritated, burns. She also reports an odor coming from the area. She feels like the Nystatin cream has gotten worse since using the cream. She has been using abdominal pads to keep the area warm and dry. She has not tried anything additional OTC.   Review of Systems  Past Medical History:  Diagnosis Date  . Allergy    allergic rhinitis  . Anemia   . Anxiety   . Arthritis    osteoarthritis  . Asthma   . Claustrophobia    does not like oxygen mask covering face  . Depression   . Diabetes mellitus without complication ([facility])   . Fall 2016  . Fatty liver 07/2008   abd. ultrasound - fatty liver ; slt dilated cbd (no stones ) 06/10// abd.  ultrasound normal on 04/2006  . Fibromyalgia   . GERD (gastroesophageal reflux disease)    EGD negative 06/2001// EGD erythematous mucosa, polyp 08/2008  . High uric acid in 24 hour urine specimen   . Hyperhidrosis   . Hyperlipidemia   . Kidney stones   . Lymphedema   . Neuromuscular disorder (HCC)    fibromyalgia  . Obesity   . Shortness of breath dyspnea   . Tachycardia   . TMJ (dislocation of temporomandibular joint)   . UTI (lower urinary tract infection)     Current Outpatient Medications  Medication Sig Dispense Refill  . ACCU-CHEK AVIVA PLUS test strip Use to check blood sugar  once a day 100 each 0  . ACCU-CHEK SOFTCLIX LANCETS lancets Use to check blood sugar  once a day 100 each 0  . acetaminophen (TYLENOL) 500 MG tablet Take 500 mg by mouth every 6 (six) hours as needed.     Marland Kitchen albuterol (PROVENTIL HFA;VENTOLIN HFA) 108 (90 Base) MCG/ACT inhaler Inhale 2  puffs into the lungs every 4 (four) hours as needed for wheezing. 1 Inhaler 3  . ALPRAZolam (XANAX) 1 MG tablet Take 2 mg by mouth 3 (three) times daily as needed.     . benzonatate (TESSALON) 200 MG capsule TAKE 1 CAPSULES BY MOUTH 3 TIMES DAILY AS NEEDED FOR COUGH 90 capsule 2  . Blood Glucose Calibration (ACCU-CHEK AVIVA) SOLN     . buPROPion (WELLBUTRIN XL) 150 MG 24 hr tablet     . Cetirizine HCl 10 MG CAPS Take 1 capsule by mouth daily.    . cholecalciferol (VITAMIN D) 1000 units tablet Take 5,000 Units by mouth daily.    . cyclobenzaprine (FLEXERIL) 5 MG tablet TAKE ONE TABLET THREE TIMES A DAY IF NEEDED FOR MUSCLE SPASM 30 tablet 3  . dicyclomine (BENTYL) 10 MG capsule Take 1 capsule (10 mg total) by mouth 4 (four) times daily -  before meals and at bedtime. 120 capsule 2  . dimenhyDRINATE (DRAMAMINE) 50 MG tablet Take 50 mg by mouth every 8 (eight) hours as needed.    . DULoxetine (CYMBALTA) 60 MG capsule Take 60 mg by mouth daily.    . Ergocalciferol (VITAMIN D2 PO) Take by mouth once a week.    Marland Kitchen FLUoxetine (PROZAC)  40 MG capsule Take 80 mg by mouth daily.    . fluticasone (FLONASE ALLERGY RELIEF) 50 MCG/ACT nasal spray Place into both nostrils daily.    . furosemide (LASIX) 20 MG tablet Take 1 tablet (20 mg total) by mouth daily. Prn edema 30 tablet 3  . gabapentin (NEURONTIN) 100 MG capsule Take 2 capsules (200 mg total) by mouth 2 (two) times daily. 360 capsule 3  . gabapentin (NEURONTIN) 300 MG capsule Take 1 capsule (300 mg total) by mouth at bedtime. 90 capsule 3  . glipiZIDE (GLUCOTROL XL) 5 MG 24 hr tablet Take 5 mg by mouth daily with breakfast.    . ibuprofen (ADVIL,MOTRIN) 600 MG tablet Take 1 tablet (600 mg total) by mouth every 8 (eight) hours as needed. 30 tablet 0  . ketoconazole (NIZORAL) 2 % cream Apply 1 application topically daily. To affected area 60 g 1  . loratadine (CLARITIN) 10 MG tablet Take by mouth.    . losartan (COZAAR) 25 MG tablet     . nystatin  (MYCOSTATIN/NYSTOP) powder Apply topically 2 (two) times daily. To affected areas of yeast skin infection (between skin folds) 60 g 3  . omeprazole (PRILOSEC) 20 MG capsule TAKE 1 CAPSULE BY MOUTH TWO TIMES DAILY 180 capsule 1  . oxybutynin (DITROPAN XL) 15 MG 24 hr tablet TAKE ONE TABLET BY MOUTH EVERY DAY 90 tablet 0  . pravastatin (PRAVACHOL) 20 MG tablet TAKE 1 TABLET BY MOUTH NIGHTLY 30 tablet 5  . Simethicone (GAS-X PO) Take by mouth.    . spironolactone (ALDACTONE) 100 MG tablet Take 1 tablet (100 mg total) by mouth daily. 30 tablet 3  . talc (ZEASORB) powder Apply topically as needed. (Patient taking differently: Apply 1 application topically daily as needed (sweat). ) 240 g 3  . tamsulosin (FLOMAX) 0.4 MG CAPS capsule Take 1 capsule (0.4 mg total) by mouth daily. 30 capsule 0  . traMADol (ULTRAM) 50 MG tablet Take 1 tablet (50 mg total) by mouth 2 (two) times daily as needed. 30 tablet 0  . traZODone (DESYREL) 150 MG tablet Take 450 mg by mouth at bedtime.     . TRULICITY 1.5 IE/3.3IR SOPN INJECT CONTENTS OF 1 PEN ONCE A WEEK 2 mL 3  . vitamin B-12 (CYANOCOBALAMIN) 1000 MCG tablet Take 1,000 mcg by mouth daily.    . fluconazole (DIFLUCAN) 150 MG tablet Take 1 tablet (150 mg total) by mouth once a week. 4 tablet 0   No current facility-administered medications for this visit.     Allergies  Allergen Reactions  . Cephalexin   . Codeine     REACTION: ? reaction  . Duloxetine   . Levaquin [Levofloxacin] Nausea Only  . Lisinopril Cough  . Metformin And Related Diarrhea    GI side effects   . Prednisone     Tolerates intraarticular depo-medrol.  . Quetiapine Other (See Comments)  . Sertraline Hcl     REACTION: vivid dreams  . Simvastatin Other (See Comments)    achey  . Triazolam     REACTION: ? reaction  . Zaleplon   . Zocor [Simvastatin - High Dose]     achey  . Zolpidem Tartrate     hallucinations  . Hydrocodone Rash    REACTION: ? reaction REACTION: ? reaction  .  Norelgestromin-Eth Estradiol     Patch didn't stick to skin    Family History  Problem Relation Age of Onset  . Hypertension Father   . Cancer Father  pancreatic cancer  . Hypertension Sister   . Heart disease Maternal Grandmother 60       MI  . Diabetes Maternal Grandmother   . Heart disease Paternal Grandmother 40       MI  . Diabetes Paternal Grandmother   . Breast cancer Neg Hx     Social History   Socioeconomic History  . Marital status: Single    Spouse name: Not on file  . Number of children: Not on file  . Years of education: Not on file  . Highest education level: Not on file  Occupational History  . Not on file  Social Needs  . Financial resource strain: Not on file  . Food insecurity:    Worry: Not on file    Inability: Not on file  . Transportation needs:    Medical: Not on file    Non-medical: Not on file  Tobacco Use  . Smoking status: Never Smoker  . Smokeless tobacco: Never Used  Substance and Sexual Activity  . Alcohol use: No    Alcohol/week: 0.0 oz  . Drug use: No  . Sexual activity: Not Currently    Birth control/protection: None, Post-menopausal  Lifestyle  . Physical activity:    Days per week: Not on file    Minutes per session: Not on file  . Stress: Not on file  Relationships  . Social connections:    Talks on phone: Not on file    Gets together: Not on file    Attends religious service: Not on file    Active member of club or organization: Not on file    Attends meetings of clubs or organizations: Not on file    Relationship status: Not on file  . Intimate partner violence:    Fear of current or ex partner: Not on file    Emotionally abused: Not on file    Physically abused: Not on file    Forced sexual activity: Not on file  Other Topics Concern  . Not on file  Social History Narrative  . Not on file     Constitutional: Denies fever, malaise, fatigue, headache or abrupt weight changes.  Skin: Pt reports rash  under right breast, left groin. Denies redness, rashes, lesions or ulcercations.    No other specific complaints in a complete review of systems (except as listed in HPI above).     Objective:   Physical Exam  BP 124/80   Pulse (!) 101   Temp 97.8 F (36.6 C) (Oral)   Wt (!) 332 lb (150.6 kg)   SpO2 95%   BMI 56.99 kg/m  Wt Readings from Last 3 Encounters:  08/19/17 (!) 332 lb (150.6 kg)  08/12/17 (!) 333 lb 4 oz (151.2 kg)  06/11/17 (!) 332 lb (150.6 kg)    General: Appears her stated age, well developed, well nourished in NAD. Skin: Scattered erythematous rash noted under right breast. Convalescent rash, with shiny center, erythematous border, foul odor of left groin area noted.  BMET    Component Value Date/Time   NA 143 05/21/2017 1559   NA 142 06/05/2011 2203   K 5.0 05/21/2017 1559   K 3.8 06/05/2011 2203   CL 100 05/21/2017 1559   CL 106 06/05/2011 2203   CO2 26 05/21/2017 1559   CO2 21 06/05/2011 2203   GLUCOSE 154 (H) 05/21/2017 1559   GLUCOSE 139 (H) 09/05/2016 1859   GLUCOSE 204 (H) 06/05/2011 2203   BUN 10 05/21/2017 1559  BUN 13 06/05/2011 2203   CREATININE 1.04 (H) 05/21/2017 1559   CREATININE 1.13 06/05/2011 2203   CALCIUM 10.3 (H) 05/21/2017 1559   CALCIUM 9.0 06/05/2011 2203   GFRNONAA 62 05/21/2017 1559   GFRNONAA 59 (L) 06/05/2011 2203   GFRAA 72 05/21/2017 1559   GFRAA >60 06/05/2011 2203    Lipid Panel     Component Value Date/Time   CHOL 247 (H) 05/21/2017 1559   TRIG 269 (H) 05/21/2017 1559   HDL 40 05/21/2017 1559   CHOLHDL 6.2 (H) 05/21/2017 1559   CHOLHDL 4 11/05/2014 1204   VLDL 61.0 (H) 11/05/2014 1204   LDLCALC 153 (H) 05/21/2017 1559    CBC    Component Value Date/Time   WBC 8.5 05/21/2017 1559   WBC 7.3 12/21/2016 1316   RBC 4.72 05/21/2017 1559   RBC 4.77 12/21/2016 1316   HGB 14.8 05/21/2017 1559   HCT 44.0 05/21/2017 1559   PLT 254 05/21/2017 1559   MCV 93 05/21/2017 1559   MCV 96 06/05/2011 2203   MCH 31.4  05/21/2017 1559   MCH 31.1 06/23/2015 1744   MCHC 33.6 05/21/2017 1559   MCHC 32.5 12/21/2016 1316   RDW 14.0 05/21/2017 1559   RDW 13.9 06/05/2011 2203   LYMPHSABS 2.4 09/05/2016 1859   MONOABS 0.7 09/05/2016 1859   EOSABS 0.2 09/05/2016 1859   BASOSABS 0.1 09/05/2016 1859    Hgb A1C Lab Results  Component Value Date   HGBA1C 7.0 04/17/2017           Assessment & Plan:   Candidal Intertrigo:  Unresponsive to topical medication eRx for Fluconazole 150 mg weekly x 4 weeks Continue Nystatin cream topically Encouraged to keep the area warm, dry and free from friction although difficult due to morbid obesity  Advised to follow up with PCP if symptoms persist or worsen Webb Silversmith, NP

## 2017-08-20 ENCOUNTER — Encounter: Payer: Medicare Other | Admitting: Occupational Therapy

## 2017-08-21 ENCOUNTER — Encounter: Payer: Medicare Other | Admitting: *Deleted

## 2017-08-21 DIAGNOSIS — Z713 Dietary counseling and surveillance: Secondary | ICD-10-CM | POA: Diagnosis not present

## 2017-08-21 DIAGNOSIS — F509 Eating disorder, unspecified: Secondary | ICD-10-CM

## 2017-08-21 NOTE — Progress Notes (Signed)
Appointment start time:1500  Appointment end time: 1600  Patient was seen on 08/21/17 for nutrition counseling pertaining to disordered eating  Primary care provider: Dr. Glori Bickers Therapist: chuck burnett   Assessment Psychologist giving diet advice in contrast to this provider Felt fat shamed by medical provider who subbed for her PCP Yeast infection isn't healing well  PCOS not medically managed  She's very overwhelmed with feeling like a failure and wanting to lose weight, but knowing she can't  Dietary assessment: A typical day consists of 2-3 meals and several snacks   Safe foods include: fast food- taco bell, fried shrimp Avoided foods include:many  24 hour recall:  Bagel thin with cream cheese and deli meat Chicken with salsa and cheese with pretzel bun   Nutrition Diagnosis: NB-1.5 Disordered eating pattern As related to meal skipping and reported binges.  As evidenced by self report.  Intervention/Goals: Nutrition counseling provided.  Listen to Food Psych podcast and PCOS and [employer] Gave PCOS Tips Goals are: having 3 meals a day as a start. Suggested carb, protein, and fat with all three meals.    Monitoring and Evaluation: Patient will follow up in 2 weeks.

## 2017-08-22 ENCOUNTER — Ambulatory Visit: Payer: Medicare Other | Admitting: Occupational Therapy

## 2017-08-22 ENCOUNTER — Encounter: Payer: Self-pay | Admitting: Family Medicine

## 2017-08-22 ENCOUNTER — Ambulatory Visit: Payer: Medicare Other | Admitting: Family Medicine

## 2017-08-22 VITALS — BP 126/74 | HR 84 | Temp 98.4°F | Ht 64.0 in | Wt 334.5 lb

## 2017-08-22 DIAGNOSIS — I890 Lymphedema, not elsewhere classified: Secondary | ICD-10-CM

## 2017-08-22 DIAGNOSIS — B372 Candidiasis of skin and nail: Secondary | ICD-10-CM

## 2017-08-22 DIAGNOSIS — E282 Polycystic ovarian syndrome: Secondary | ICD-10-CM | POA: Diagnosis not present

## 2017-08-22 DIAGNOSIS — L918 Other hypertrophic disorders of the skin: Secondary | ICD-10-CM

## 2017-08-22 NOTE — Assessment & Plan Note (Signed)
Primarily in L groin-causing pain Morbidly obese with intertrigo as well  Ref to derm  Tricky in light of size of pannus

## 2017-08-22 NOTE — Assessment & Plan Note (Signed)
Ongoing in L groin Some improvement on exam  Using ketoconazole (applied today as well)  Enc her to keep area clean and dry -difficult as she is morbidly obese and cannot lift pannus  She thinks skin tags are causing some discomfort Derm ref done   New area under R breast Enc her to use cream there as well  Update if not starting to improve in a week or if worsening

## 2017-08-22 NOTE — Progress Notes (Signed)
Subjective:    Patient ID: Mackenzie Bonilla, female    DOB: [birth year], 52 y.o.   MRN: [id number]  HPI  Here for f/u of candidal intertrigo   L groin area Recurrent  Morbidly obese-hard to clean area   Last visit px ketoconazole cream  Has nystatin powder as well   Her mother cares for it  Looks a little better  One spot is tender- she has skin tags - ? If that is the cause    Says rash is now under breast as well -R one  Using zea sorb on it  Has not tried cream    ovasitol   (inositol)-was recommended from her nutritionist for help with wt loss with PCOS Cannot take metformin    Patient Active Problem List   Diagnosis Date Noted  . Cutaneous skin tags 08/22/2017  . Eating disorder 05/23/2017  . Abdominal pain, right upper quadrant 05/01/2017  . Gallstones 04/19/2017  . Diabetic polyneuropathy associated with type 2 diabetes mellitus ([place]) 04/07/2017  . Diabetic angiopathy ([place]) 04/07/2017  . Vasomotor symptoms due to menopause 04/07/2017  . Mobility impaired 11/12/2016  . Diarrhea 09/05/2016  . Urinary incontinence 12/02/2015  . Candidal intertrigo 04/06/2015  . Cystocele 04/06/2015  . Constipation 04/06/2015  . Back pain 12/28/2014  . Hypertriglyceridemia 10/14/2014  . Breast mass in female 07/28/2014  . Fall 07/23/2013  . Great toe pain 05/12/2013  . Medication management 11/20/2012  . Abdominal pain 04/23/2012  . Uric acid kidney stone 11/20/2011  . HYPERHIDROSIS 11/07/2009  . Menopausal symptoms 06/17/2009  . Type 2 diabetes mellitus without complication, without long-term current use of insulin ([place]) 08/23/2008  . VITAMIN D DEFICIENCY 05/07/2008  . VITAMIN B12 DEFICIENCY 06/04/2007  . CHRONIC FATIGUE SYNDROME 11/15/2006  . DIVERTICULITIS, HX OF 11/15/2006  . PCOS (polycystic ovarian syndrome) 10/16/2006  . Hyperlipidemia LDL goal <100 10/16/2006  . Morbid obesity with BMI of 50.0-59.9, adult ([place]) 10/16/2006  . Generalized anxiety disorder  10/16/2006  . PANIC DISORDER 10/16/2006  . BULIMIA 10/16/2006  . Chronic depression 10/16/2006  . CARPAL TUNNEL SYNDROME 10/16/2006  . LYMPHEDEMA 10/16/2006  . Allergic rhinitis 10/16/2006  . Mild intermittent asthma 10/16/2006  . TMJ SYNDROME 10/16/2006  . GERD 10/16/2006  . IRRITABLE BOWEL SYNDROME 10/16/2006  . OSTEOARTHRITIS 10/16/2006  . Myalgia and myositis 10/16/2006  . COUGH, CHRONIC 10/16/2006   Past Medical History:  Diagnosis Date  . Allergy    allergic rhinitis  . Anemia   . Anxiety   . Arthritis    osteoarthritis  . Asthma   . Claustrophobia    does not like oxygen mask covering face  . Depression   . Diabetes mellitus without complication ([place])   . Fall 2016  . Fatty liver 07/2008   abd. ultrasound - fatty liver ; slt dilated cbd (no stones ) 06/10// abd.  ultrasound normal on 04/2006  . Fibromyalgia   . GERD (gastroesophageal reflux disease)    EGD negative 06/2001// EGD erythematous mucosa, polyp 08/2008  . High uric acid in 24 hour urine specimen   . Hyperhidrosis   . Hyperlipidemia   . Kidney stones   . Lymphedema   . Neuromuscular disorder ([facility])    fibromyalgia  . Obesity   . Shortness of breath dyspnea   . Tachycardia   . TMJ (dislocation of temporomandibular joint)   . UTI (lower urinary tract infection)    Past Surgical History:  Procedure Laterality Date  . BREAST BIOPSY Right 2016  neg  . BREAST LUMPECTOMY WITH RADIOACTIVE SEED LOCALIZATION Right 08/02/2014   Procedure: BREAST LUMPECTOMY WITH RADIOACTIVE SEED LOCALIZATION;  Surgeon: Rolm Bookbinder, MD;  Location: [facility];  Service: General;  Laterality: Right;  . COLONOSCOPY  08/12/2012   Completed by Dr. Phylis Bougie, normal exam.   . ENDOMETRIAL BIOPSY  01/2004  . ESOPHAGOGASTRODUODENOSCOPY    . FOOT SURGERY    . SEPTOPLASTY     two times  . TONSILLECTOMY    . UTERINE FIBROID SURGERY  06/2005   ablation  . uterine tumor  09/2001  . VAGUS NERVE STIMULATOR INSERTION     Social  History   Tobacco Use  . Smoking status: Never Smoker  . Smokeless tobacco: Never Used  Substance Use Topics  . Alcohol use: No    Alcohol/week: 0.0 oz  . Drug use: No   Family History  Problem Relation Age of Onset  . Hypertension Father   . Cancer Father        pancreatic cancer  . Hypertension Sister   . Heart disease Maternal Grandmother 60       MI  . Diabetes Maternal Grandmother   . Heart disease Paternal Grandmother 59       MI  . Diabetes Paternal Grandmother   . Breast cancer Neg Hx    Allergies  Allergen Reactions  . Cephalexin   . Codeine     REACTION: ? reaction  . Duloxetine   . Levaquin [Levofloxacin] Nausea Only  . Lisinopril Cough  . Metformin And Related Diarrhea    GI side effects   . Prednisone     Tolerates intraarticular depo-medrol.  . Quetiapine Other (See Comments)  . Sertraline Hcl     REACTION: vivid dreams  . Simvastatin Other (See Comments)    achey  . Triazolam     REACTION: ? reaction  . Zaleplon   . Zocor [Simvastatin - High Dose]     achey  . Zolpidem Tartrate     hallucinations  . Hydrocodone Rash    REACTION: ? reaction REACTION: ? reaction  . Norelgestromin-Eth Estradiol     Patch didn't stick to skin   Current Outpatient Medications on File Prior to Visit  Medication Sig Dispense Refill  . ACCU-CHEK AVIVA PLUS test strip Use to check blood sugar  once a day 100 each 0  . ACCU-CHEK SOFTCLIX LANCETS lancets Use to check blood sugar  once a day 100 each 0  . acetaminophen (TYLENOL) 500 MG tablet Take 500 mg by mouth every 6 (six) hours as needed.     Marland Kitchen albuterol (PROVENTIL HFA;VENTOLIN HFA) 108 (90 Base) MCG/ACT inhaler Inhale 2 puffs into the lungs every 4 (four) hours as needed for wheezing. 1 Inhaler 3  . ALPRAZolam (XANAX) 1 MG tablet Take 2 mg by mouth 3 (three) times daily as needed.     . benzonatate (TESSALON) 200 MG capsule TAKE 1 CAPSULES BY MOUTH 3 TIMES DAILY AS NEEDED FOR COUGH 90 capsule 2  . Blood Glucose  Calibration (ACCU-CHEK AVIVA) SOLN     . buPROPion (WELLBUTRIN XL) 150 MG 24 hr tablet     . Cetirizine HCl 10 MG CAPS Take 1 capsule by mouth daily.    . cholecalciferol (VITAMIN D) 1000 units tablet Take 5,000 Units by mouth daily.    . cyclobenzaprine (FLEXERIL) 5 MG tablet TAKE ONE TABLET THREE TIMES A DAY IF NEEDED FOR MUSCLE SPASM 30 tablet 3  . dicyclomine (BENTYL) 10 MG capsule Take  1 capsule (10 mg total) by mouth 4 (four) times daily -  before meals and at bedtime. 120 capsule 2  . dimenhyDRINATE (DRAMAMINE) 50 MG tablet Take 50 mg by mouth every 8 (eight) hours as needed.    . DULoxetine (CYMBALTA) 60 MG capsule Take 60 mg by mouth daily.    . Ergocalciferol (VITAMIN D2 PO) Take by mouth once a week.    . fluconazole (DIFLUCAN) 150 MG tablet Take 1 tablet (150 mg total) by mouth once a week. 4 tablet 0  . FLUoxetine (PROZAC) 40 MG capsule Take 80 mg by mouth daily.    . fluticasone (FLONASE ALLERGY RELIEF) 50 MCG/ACT nasal spray Place into both nostrils daily.    . furosemide (LASIX) 20 MG tablet Take 1 tablet (20 mg total) by mouth daily. Prn edema 30 tablet 3  . gabapentin (NEURONTIN) 100 MG capsule Take 2 capsules (200 mg total) by mouth 2 (two) times daily. 360 capsule 3  . gabapentin (NEURONTIN) 300 MG capsule Take 1 capsule (300 mg total) by mouth at bedtime. 90 capsule 3  . glipiZIDE (GLUCOTROL XL) 5 MG 24 hr tablet Take 5 mg by mouth daily with breakfast.    . ibuprofen (ADVIL,MOTRIN) 600 MG tablet Take 1 tablet (600 mg total) by mouth every 8 (eight) hours as needed. 30 tablet 0  . ketoconazole (NIZORAL) 2 % cream Apply 1 application topically daily. To affected area 60 g 1  . loratadine (CLARITIN) 10 MG tablet Take by mouth.    . losartan (COZAAR) 25 MG tablet     . nystatin (MYCOSTATIN/NYSTOP) powder Apply topically 2 (two) times daily. To affected areas of yeast skin infection (between skin folds) 60 g 3  . omeprazole (PRILOSEC) 20 MG capsule TAKE 1 CAPSULE BY MOUTH TWO  TIMES DAILY 180 capsule 1  . oxybutynin (DITROPAN XL) 15 MG 24 hr tablet TAKE ONE TABLET BY MOUTH EVERY DAY 90 tablet 0  . pravastatin (PRAVACHOL) 20 MG tablet TAKE 1 TABLET BY MOUTH NIGHTLY 30 tablet 5  . Simethicone (GAS-X PO) Take by mouth.    . spironolactone (ALDACTONE) 100 MG tablet Take 1 tablet (100 mg total) by mouth daily. 30 tablet 3  . talc (ZEASORB) powder Apply topically as needed. (Patient taking differently: Apply 1 application topically daily as needed (sweat). ) 240 g 3  . tamsulosin (FLOMAX) 0.4 MG CAPS capsule Take 1 capsule (0.4 mg total) by mouth daily. 30 capsule 0  . traMADol (ULTRAM) 50 MG tablet Take 1 tablet (50 mg total) by mouth 2 (two) times daily as needed. 30 tablet 0  . traZODone (DESYREL) 150 MG tablet Take 450 mg by mouth at bedtime.     . TRULICITY 1.5 YW/7.3XT SOPN INJECT CONTENTS OF 1 PEN ONCE A WEEK 2 mL 3  . vitamin B-12 (CYANOCOBALAMIN) 1000 MCG tablet Take 1,000 mcg by mouth daily.     No current facility-administered medications on file prior to visit.     Review of Systems  Constitutional: Positive for fatigue. Negative for activity change, appetite change, fever and unexpected weight change.  HENT: Negative for congestion, ear pain, rhinorrhea, sinus pressure and sore throat.   Eyes: Negative for pain, redness and visual disturbance.  Respiratory: Negative for cough, shortness of breath and wheezing.   Cardiovascular: Negative for chest pain and palpitations.  Gastrointestinal: Positive for abdominal distention and abdominal pain. Negative for blood in stool, constipation and diarrhea.       Chronic abd pain and bloating  Many food intolerances   Endocrine: Negative for polydipsia and polyuria.  Genitourinary: Negative for dysuria, frequency and urgency.  Musculoskeletal: Positive for arthralgias, back pain and myalgias.  Skin: Positive for rash. Negative for pallor.  Allergic/Immunologic: Negative for environmental allergies.  Neurological:  Positive for headaches. Negative for dizziness and syncope.  Hematological: Negative for adenopathy. Does not bruise/bleed easily.  Psychiatric/Behavioral: Negative for decreased concentration and dysphoric mood. The patient is not nervous/anxious.        Objective:   Physical Exam  Constitutional: She appears well-developed and well-nourished. No distress.  Morbidly obese and well appearing   HENT:  Head: Normocephalic and atraumatic.  Mouth/Throat: Oropharynx is clear and moist.  Eyes: Pupils are equal, round, and reactive to light. Conjunctivae and EOM are normal. Right eye exhibits no discharge. Left eye exhibits no discharge. No scleral icterus.  Neck: Normal range of motion. Neck supple.  Pulmonary/Chest: Effort normal and breath sounds normal.  Musculoskeletal: She exhibits edema.  Baseline lymphedema LEs  Lymphadenopathy:    She has no cervical adenopathy.  Neurological: She is alert. A cranial nerve deficit is present. Coordination normal.  Skin: Skin is warm and dry. Capillary refill takes less than 2 seconds. Rash noted. There is erythema.  Erythematous rash in L pannus is improved  Erythema/scalloped edges/no longer has satellite lesions No skin breakdown  Multiple skin tags of different sizes   Erythema under R breast -mild with scalloped edge-resembles yeast intertrigo also     Psychiatric: Her mood appears anxious. She exhibits a depressed mood.          Assessment & Plan:   Problem List Items Addressed This Visit      Endocrine   PCOS (polycystic ovarian syndrome)    Pt is interested in supplement she was recommended called inositol  Wants me to look into it  She is intol of metformin  This is supposed to help with weight and insulin resistance Will see what I can find        Musculoskeletal and Integument   Candidal intertrigo - Primary    Ongoing in L groin Some improvement on exam  Using ketoconazole (applied today as well)  Enc her to keep  area clean and dry -difficult as she is morbidly obese and cannot lift pannus  She thinks skin tags are causing some discomfort Derm ref done   New area under R breast Enc her to use cream there as well  Update if not starting to improve in a week or if worsening        Relevant Orders   Ambulatory referral to Dermatology   Cutaneous skin tags    Primarily in L groin-causing pain Morbidly obese with intertrigo as well  Ref to derm  Tricky in light of size of pannus       Relevant Orders   Ambulatory referral to Dermatology

## 2017-08-22 NOTE — Patient Instructions (Addendum)
The rash is looking better  Continue the ketoconazole cream  For area under right breast-also use the ketoconazole cream   Keep as clean and dry in the areas of rash  Change clothes or pads frequently as needed for comfort and dryness   Lets refer you to dermatology for help with skin tags and rash   I will look up the inositol medicine and let you know what I think

## 2017-08-22 NOTE — Assessment & Plan Note (Signed)
Pt is interested in supplement she was recommended called inositol  Wants me to look into it  She is intol of metformin  This is supposed to help with weight and insulin resistance Will see what I can find

## 2017-08-22 NOTE — Therapy (Signed)
[place] MAIN [facility] SERVICES [address] [place], [place], [address] Phone: [phone]   Fax:  [phone]  Occupational Therapy Treatment  Patient Details  Name: Mackenzie Bonilla MRN: [id number] Date of Birth: [birth year] No data recorded  Encounter Date: 08/22/2017  OT End of Session - 08/22/17 1718    Visit Number  7    Number of Visits  36    Date for OT Re-Evaluation  10/22/17    OT Start Time  0200    OT Stop Time  0315    OT Time Calculation (min)  75 min    Activity Tolerance  Patient tolerated treatment well;No increased pain    Behavior During Therapy  WFL for tasks assessed/performed       Past Medical History:  Diagnosis Date  . Allergy    allergic rhinitis  . Anemia   . Anxiety   . Arthritis    osteoarthritis  . Asthma   . Claustrophobia    does not like oxygen mask covering face  . Depression   . Diabetes mellitus without complication ([place])   . Fall 2016  . Fatty liver 07/2008   abd. ultrasound - fatty liver ; slt dilated cbd (no stones ) 06/10// abd.  ultrasound normal on 04/2006  . Fibromyalgia   . GERD (gastroesophageal reflux disease)    EGD negative 06/2001// EGD erythematous mucosa, polyp 08/2008  . High uric acid in 24 hour urine specimen   . Hyperhidrosis   . Hyperlipidemia   . Kidney stones   . Lymphedema   . Neuromuscular disorder (HCC)    fibromyalgia  . Obesity   . Shortness of breath dyspnea   . Tachycardia   . TMJ (dislocation of temporomandibular joint)   . UTI (lower urinary tract infection)     Past Surgical History:  Procedure Laterality Date  . BREAST BIOPSY Right 2016   neg  . BREAST LUMPECTOMY WITH RADIOACTIVE SEED LOCALIZATION Right 08/02/2014   Procedure: BREAST LUMPECTOMY WITH RADIOACTIVE SEED LOCALIZATION;  Surgeon: Rolm Bookbinder, MD;  Location: [facility];  Service: General;  Laterality: Right;  . COLONOSCOPY  08/12/2012   Completed by Dr. Phylis Bougie, normal exam.   .  ENDOMETRIAL BIOPSY  01/2004  . ESOPHAGOGASTRODUODENOSCOPY    . FOOT SURGERY    . SEPTOPLASTY     two times  . TONSILLECTOMY    . UTERINE FIBROID SURGERY  06/2005   ablation  . uterine tumor  09/2001  . VAGUS NERVE STIMULATOR INSERTION      There were no vitals filed for this visit.  Subjective Assessment - 08/22/17 1714    Subjective   Ms. Pinard is here for OT visit 7/36 for Intensive Phase CDT to BLE, with inital emphasis on LLE. Pt is unaccompanied. Pt presents without compression wraps in place below the knee . Pt discusses concerns about increased leg swelling and pain during road trip over the weekend to/from  [place] . OT advised Pt to elevate legs in back seet as much as possible and request a break hourly to get out and walk around .    Pertinent History  Primary LE dx at age 52 is consistent with LE Praecox, Lymphedema praecox, also known as Meige disease, is the most common form of primary lymphedema. By definition, this disease becomes clinically evident after birth and before age 52 years. L ankle fx 2017, obesity, depression, OA- B knees, chromnic back pain, type II diabetes  Limitations  chronic leg pain and swelling, impaired standing tolerance, difficulty fitting LB clothing and street shoes,     Repetition  Increases Symptoms    Patient Stated Goals  get swelling down and relieve leg pain so I can do more    Currently in Pain?  Yes chronic leg pain unchanged. Increased neopathic symptoms in foot this week by report.    Pain Onset  In the past 7 days                   OT Treatments/Exercises (OP) - 08/22/17 0001      ADLs   ADL Education Given  Yes      Manual Therapy   Manual Therapy  Edema management;Manual Lymphatic Drainage (MLD);Compression Bandaging;Taping    Manual therapy comments  skin care toLLE w/ low ph castor oil during MLD, and with low ph Eucerin lotio0n prior to reapplying wraps.    Edema Management  kinesio tape to R knee for OA  pain supporting knee with teardrop shaped configuration .    Manual Lymphatic Drainage (MLD)  MLD to LLE utilizing deep abdominal lymphatics, short neck sequence, functional inguinal LN and popliteal LN in supine. Pt instructed for diaphragmatic breathing, J stroke and sh9ort neck sequence. Handouts given.     Compression Bandaging  4 layer gradient compressino wrap to LLE             OT Education - 08/22/17 1718    Education Details  Continued skilled Pt/caregiver education  And LE ADL training throughout visit for lymphedema self care/ home program, including kinesio tape wear and care, compression wrapping, compression garment and device wear/care, lymphatic pumping ther ex, simple self-MLD, and skin care. Discussed progress towards goals.     Person(s) Educated  Patient;Parent(s)    Methods  Explanation;Demonstration;Tactile cues;Verbal cues;Handout    Comprehension  Verbalized understanding;Returned demonstration;Verbal cues required;Tactile cues required;Need further instruction          OT Long Term Goals - 07/25/17 1030      OT LONG TERM GOAL #1   Title  Pt independent w/ lymphedema precautions/prevention principals and using printed reference to limit LE progression and infection risk.    Baseline  Max A    Time  2    Period  Weeks    Status  New    Target Date  -- 10th OT Rx visit      OT LONG TERM GOAL #2   Title  Lymphedema (LE) management/ self-care: Pt able to apply knee  length gradient compression wraps using correct techniques with modified independence ( extra time and assistive device/s PRN)   within 2 weeks to achieve optimal limb volume reduction during Intensive Phase of CDT and optimal self-management over time.    Baseline  max A    Time  2    Period  Weeks    Status  New    Target Date  -- 10th OT Rx visit      OT LONG TERM GOAL #3   Title  Lymphedema (LE) management/ self-care:  Pt to achieve at least 10%  limb volume reductions bilaterally  during  Intensive Phase CDT to limit LE progression, to decrease infection risk and to improve functional performance in all occupational domains.     Baseline  max a    Time  12    Period  Weeks    Status  New    Target Date  10/22/17  OT LONG TERM GOAL #4   Title  Lymphedema (LE) management/ self-care:  Pt to tolerate daily compression garments and/ or HOS devices in keeping w/ prescribed wear regime within 1 week of issue date to restore normal limb shape to reduce tissue density and protein build up leading to progression.    Baseline  max A    Time  12    Period  Weeks    Status  New    Target Date  10/22/17      OT LONG TERM GOAL #5   Title  Lymphedema (LE) Pain: Pt to report 25% reduction in discomfort/ pain described as LE  "heaviness", "fullness", tightness"" by DC to facilitate improved functional ambulation and mobility and increased independence with basic and instrumental ADLs.     Baseline  max A    Time  12    Period  Weeks    Status  New    Target Date  10/22/17      Long Term Additional Goals   Additional Long Term Goals  Yes      OT LONG TERM GOAL #6   Title  Lymphedema (LE) management/ self-care:  During Management Phase CDT Pt to sustain reduced limb volumes achieved during Intensive Phase CDT during Management Phase CDT within 3% using LE self-care home program components as directed ( simple self MLD, skin care, ther ex and daily/ nightly compression garments/ devices) to limit LE progression and further functional decline.    Baseline  max a    Time  6    Period  Months    Status  New    Target Date  01/21/18            Plan - 08/22/17 1719    Clinical Impression Statement  Pt reorts she has decreased PT frequncy to 1 x weekly to deal with pain and fatigue after sessions. Pt tolerated LLE MLD today without difficulty. Swelling reduction noted last visit is not noted today. Pt's mother isa ssisting her with wraps intermmittently only during visit intervals.  Pt is unable to wrap herself to date due to inability to reach her feet. Skin condition is mildly improved on the LL. R skin condition unchanged,. Applied kinesio tape to R knee today in effort to reduce knee pain 2/2 OA.  Applied LLE compression wrap as established. Cont as per POC..    Occupational Profile and client history currently impacting functional performance  Pt states episodes of severe depression may impact participation in OT    Occupational performance deficits (Please refer to evaluation for details):  ADL's;IADL's;Work;Leisure;Social Participation;Other decreased body image    Rehab Potential  Good    OT Frequency  3x / week    OT Duration  12 weeks    OT Treatment/Interventions  Self-care/ADL training;Therapeutic exercise;Manual lymph drainage;Coping strategies training;Compression bandaging;Patient/family education;Other (comment);Energy conservation;Therapist, nutritional;Therapeutic activities;DME and/or AE instruction;Manual Therapy    Plan  Fit with knee length BLE compression garments for daytime use and knee length HOS device to limit fibrosis formation and improve lymphatic function during HOS.    Clinical Decision Making  Several treatment options, min-mod task modification necessary    Consulted and Agree with Plan of Care  Patient       Patient will benefit from skilled therapeutic intervention in order to improve the following deficits and impairments:  Abnormal gait, Decreased knowledge of use of DME, Decreased skin integrity, Increased edema, Impaired flexibility, Pain, Decreased mobility, Impaired sensation, Decreased coping skills,  Decreased range of motion, Decreased activity tolerance, Decreased knowledge of precautions, Difficulty walking, Obesity  Visit Diagnosis: Lymphedema, not elsewhere classified    Problem List Patient Active Problem List   Diagnosis Date Noted  . Cutaneous skin tags 08/22/2017  . Eating disorder 05/23/2017  . Abdominal pain,  right upper quadrant 05/01/2017  . Gallstones 04/19/2017  . Diabetic polyneuropathy associated with type 2 diabetes mellitus ([place]) 04/07/2017  . Diabetic angiopathy ([place]) 04/07/2017  . Vasomotor symptoms due to menopause 04/07/2017  . Mobility impaired 11/12/2016  . Diarrhea 09/05/2016  . Urinary incontinence 12/02/2015  . Candidal intertrigo 04/06/2015  . Cystocele 04/06/2015  . Constipation 04/06/2015  . Back pain 12/28/2014  . Hypertriglyceridemia 10/14/2014  . Breast mass in female 07/28/2014  . Fall 07/23/2013  . Great toe pain 05/12/2013  . Medication management 11/20/2012  . Abdominal pain 04/23/2012  . Uric acid kidney stone 11/20/2011  . HYPERHIDROSIS 11/07/2009  . Menopausal symptoms 06/17/2009  . Type 2 diabetes mellitus without complication, without long-term current use of insulin ([place]) 08/23/2008  . VITAMIN D DEFICIENCY 05/07/2008  . VITAMIN B12 DEFICIENCY 06/04/2007  . CHRONIC FATIGUE SYNDROME 11/15/2006  . DIVERTICULITIS, HX OF 11/15/2006  . PCOS (polycystic ovarian syndrome) 10/16/2006  . Hyperlipidemia LDL goal <100 10/16/2006  . Morbid obesity with BMI of 50.0-59.9, adult ([place]) 10/16/2006  . Generalized anxiety disorder 10/16/2006  . PANIC DISORDER 10/16/2006  . BULIMIA 10/16/2006  . Chronic depression 10/16/2006  . CARPAL TUNNEL SYNDROME 10/16/2006  . LYMPHEDEMA 10/16/2006  . Allergic rhinitis 10/16/2006  . Mild intermittent asthma 10/16/2006  . TMJ SYNDROME 10/16/2006  . GERD 10/16/2006  . IRRITABLE BOWEL SYNDROME 10/16/2006  . OSTEOARTHRITIS 10/16/2006  . Myalgia and myositis 10/16/2006  . COUGH, CHRONIC 10/16/2006    Andrey Spearman, MS, OTR/L, [facility] 08/22/17 5:24 PM  [place] MAIN [facility] SERVICES [address] [place], [place], [address] Phone: [phone]   Fax:  [phone]  Name: ORA BOLLIG MRN: [id number] Date of Birth: 1965-06-16

## 2017-08-23 ENCOUNTER — Telehealth: Payer: Self-pay | Admitting: *Deleted

## 2017-08-23 NOTE — Telephone Encounter (Signed)
-----   Message from Abner Greenspan, MD sent at 08/22/2017  7:26 PM EDT ----- Please let pt know I looked into the inositol for her PCOS It has not been studied extensively -unsure if it would help  It may have an affect on mood - so check with your psychiatrist before ordering  It may be helpful and worth a try

## 2017-08-23 NOTE — Telephone Encounter (Signed)
Left VM requesting pt to call the office back, CRM created 

## 2017-08-26 ENCOUNTER — Ambulatory Visit: Payer: Medicare Other | Admitting: Occupational Therapy

## 2017-08-26 ENCOUNTER — Telehealth: Payer: Self-pay

## 2017-08-26 DIAGNOSIS — I890 Lymphedema, not elsewhere classified: Secondary | ICD-10-CM | POA: Diagnosis not present

## 2017-08-26 NOTE — Telephone Encounter (Signed)
Copied from [place] [phone]. Topic: General - Other >> Aug 26, 2017 12:20 PM Judyann Munson wrote: Reason for CRM: Patient is requesting some Home health care, in regards to help with bathing. She is needing call back after 3pm. Her best contact number is [phone]. Please advise

## 2017-08-26 NOTE — Telephone Encounter (Signed)
Pt last seen 08/22/17.

## 2017-08-26 NOTE — Telephone Encounter (Signed)
I think unless it is a skilled nursing need or PT, insurance will not pay I will forward this to [facility] to see what she says/ thinks  They may have to hire someone independently

## 2017-08-26 NOTE — Telephone Encounter (Signed)
Spoke with Merrilee Seashore at [facility]. For personal/hygiene services insurance does not cover and patient would need to pay out of pocket. But if the service is needed also for PT this can be combined and filed with insurance if the patient qualifies when home health goes out for assessment. Patient currently goes to [facility] and patient was advised of all the information and that she can try to see if [facility] can start doing PT at home and bathing aid for her, but this is not a guarantee that she would be qualified. Patient also wonders for how long Home health aid would be able to come out to help with personal hygiene because this is a chronic issue. I advised patient usually they come for a certain period of time and once an issue with skin is healed the services are terminated but that home health representative could give her more information when they call. Patient will think about all of this and call back with her decision-Anastasiya Estell Harpin, RMA

## 2017-08-26 NOTE — Telephone Encounter (Signed)
Thanks

## 2017-08-26 NOTE — Telephone Encounter (Signed)
Patient notified of providers response.

## 2017-08-26 NOTE — Therapy (Signed)
[place] MAIN [facility] SERVICES [address] [place], [place], [address] Phone: [phone]   Fax:  [phone]  Occupational Therapy Treatment  Patient Details  Name: Mackenzie Bonilla MRN: [id number] Date of Birth: [birth year] No data recorded  Encounter Date: 08/26/2017  OT End of Session - 08/26/17 1649    Visit Number  8    Number of Visits  36    Date for OT Re-Evaluation  10/22/17    OT Start Time  0105    OT Stop Time  0201    OT Time Calculation (min)  56 min    Activity Tolerance  Patient tolerated treatment well;No increased pain    Behavior During Therapy  WFL for tasks assessed/performed       Past Medical History:  Diagnosis Date  . Allergy    allergic rhinitis  . Anemia   . Anxiety   . Arthritis    osteoarthritis  . Asthma   . Claustrophobia    does not like oxygen mask covering face  . Depression   . Diabetes mellitus without complication ([place])   . Fall 2016  . Fatty liver 07/2008   abd. ultrasound - fatty liver ; slt dilated cbd (no stones ) 06/10// abd.  ultrasound normal on 04/2006  . Fibromyalgia   . GERD (gastroesophageal reflux disease)    EGD negative 06/2001// EGD erythematous mucosa, polyp 08/2008  . High uric acid in 24 hour urine specimen   . Hyperhidrosis   . Hyperlipidemia   . Kidney stones   . Lymphedema   . Neuromuscular disorder (HCC)    fibromyalgia  . Obesity   . Shortness of breath dyspnea   . Tachycardia   . TMJ (dislocation of temporomandibular joint)   . UTI (lower urinary tract infection)     Past Surgical History:  Procedure Laterality Date  . BREAST BIOPSY Right 2016   neg  . BREAST LUMPECTOMY WITH RADIOACTIVE SEED LOCALIZATION Right 08/02/2014   Procedure: BREAST LUMPECTOMY WITH RADIOACTIVE SEED LOCALIZATION;  Surgeon: Rolm Bookbinder, MD;  Location: [facility];  Service: General;  Laterality: Right;  . COLONOSCOPY  08/12/2012   Completed by Dr. Phylis Bougie, normal exam.   .  ENDOMETRIAL BIOPSY  01/2004  . ESOPHAGOGASTRODUODENOSCOPY    . FOOT SURGERY    . SEPTOPLASTY     two times  . TONSILLECTOMY    . UTERINE FIBROID SURGERY  06/2005   ablation  . uterine tumor  09/2001  . VAGUS NERVE STIMULATOR INSERTION      There were no vitals filed for this visit.  Subjective Assessment - 08/26/17 1311    Subjective   [name] is here for OT visit 8/36 for Intensive Phase CDT to BLE, with inital emphasis on LLE. Pt is unaccompanied. Pt presents without compression wraps in place below the knee . Pt reports she was able to attend family party this weekend with help for compression wraps. "My right ankle's swollen a little more." Pt tearfull for a few moments during session today stating, "I think I'm fighting a loosing battle with my legs."    Pertinent History  Primary LE dx at age 59 is consistent with LE Praecox, Lymphedema praecox, also known as Meige disease, is the most common form of primary lymphedema. By definition, this disease becomes clinically evident after birth and before age 39 years. L ankle fx 2017, obesity, depression, OA- B knees, chromnic back pain, type II diabetes     Limitations  chronic leg pain and swelling, impaired standing tolerance, difficulty fitting LB clothing and street shoes,     Repetition  Increases Symptoms    Patient Stated Goals  get swelling down and relieve leg pain so I can do more    Currently in Pain?  Yes    Pain Location  Knee    Pain Orientation  Right;Left    Pain Descriptors / Indicators  Heaviness;Numbness;Discomfort;Tiring    Pain Type  Chronic pain    Pain Onset  In the past 7 days          LYMPHEDEMA/ONCOLOGY QUESTIONNAIRE - 08/26/17 1646      Lymphedema Assessments   Lymphedema Assessments  Lower extremities      Left Lower Extremity Lymphedema   Other  LLE limb volume below the knee measures 5612.98 ml    Other  LLE limb volume from ankle to tbial tuberosity (A-D)  is increased by 2.01% since  initial;ly measured on 07/29/2017              OT Treatments/Exercises (OP) - 08/26/17 0001      ADLs   ADL Education Given  Yes      Manual Therapy   Manual Therapy  Edema management;Manual Lymphatic Drainage (MLD);Compression Bandaging;Taping    Manual therapy comments  LLE volumetric measurements    Edema Management  skin care toLLE w/ low ph castor oil during MLD, and with low ph Eucerin lotio0n prior to reapplying wraps.    Manual Lymphatic Drainage (MLD)  MLD to LLE utilizing deep abdominal lymphatics, short neck sequence, functional inguinal LN and popliteal LN in supine. Pt instructed for diaphragmatic breathing, J stroke and sh9ort neck sequence. Handouts given.     Compression Bandaging  2 layer gradient compressino wrap to LLE             OT Education - 08/26/17 1646    Education Details  Continued skilled Pt/caregiver education  And LE ADL training throughout visit for lymphedema self care/ home program, including kinesio tape wear and care, compression wrapping, compression garment and device wear/care, lymphatic pumping ther ex, simple self-MLD, and skin care. Discussed progress towards goals.     Person(s) Educated  Patient;Parent(s)    Methods  Explanation;Demonstration;Tactile cues;Verbal cues;Handout    Comprehension  Verbalized understanding;Returned demonstration;Verbal cues required;Tactile cues required;Need further instruction          OT Long Term Goals - 07/25/17 1030      OT LONG TERM GOAL #1   Title  Pt independent w/ lymphedema precautions/prevention principals and using printed reference to limit LE progression and infection risk.    Baseline  Max A    Time  2    Period  Weeks    Status  New    Target Date  -- 10th OT Rx visit      OT LONG TERM GOAL #2   Title  Lymphedema (LE) management/ self-care: Pt able to apply knee  length gradient compression wraps using correct techniques with modified independence ( extra time and assistive  device/s PRN)   within 2 weeks to achieve optimal limb volume reduction during Intensive Phase of CDT and optimal self-management over time.    Baseline  max A    Time  2    Period  Weeks    Status  New    Target Date  -- 10th OT Rx visit      OT LONG TERM GOAL #3   Title  Lymphedema (LE)  management/ self-care:  Pt to achieve at least 10%  limb volume reductions bilaterally  during Intensive Phase CDT to limit LE progression, to decrease infection risk and to improve functional performance in all occupational domains.     Baseline  max a    Time  12    Period  Weeks    Status  New    Target Date  10/22/17      OT LONG TERM GOAL #4   Title  Lymphedema (LE) management/ self-care:  Pt to tolerate daily compression garments and/ or HOS devices in keeping w/ prescribed wear regime within 1 week of issue date to restore normal limb shape to reduce tissue density and protein build up leading to progression.    Baseline  max A    Time  12    Period  Weeks    Status  New    Target Date  10/22/17      OT LONG TERM GOAL #5   Title  Lymphedema (LE) Pain: Pt to report 25% reduction in discomfort/ pain described as LE  "heaviness", "fullness", tightness"" by DC to facilitate improved functional ambulation and mobility and increased independence with basic and instrumental ADLs.     Baseline  max A    Time  12    Period  Weeks    Status  New    Target Date  10/22/17      Long Term Additional Goals   Additional Long Term Goals  Yes      OT LONG TERM GOAL #6   Title  Lymphedema (LE) management/ self-care:  During Management Phase CDT Pt to sustain reduced limb volumes achieved during Intensive Phase CDT during Management Phase CDT within 3% using LE self-care home program components as directed ( simple self MLD, skin care, ther ex and daily/ nightly compression garments/ devices) to limit LE progression and further functional decline.    Baseline  max a    Time  6    Period  Months    Status   New    Target Date  01/21/18            Plan - 08/26/17 1649    Clinical Impression Statement  LLE limb volume from ankle to tbial tuberosity (A-D)  is increased by 2.01% since initial;ly measured on 07/29/2017. Although Pt has not had hoped for response for limb volume reduction  from CDT we had hoped for, tissue density below the knees is palpably less dense , but only mildly decreased. Pt continues to have difficulty managing personal hygiene optimally since commencing compression wrapping. Consedequently she has a yeast infection at panis area . We agreed today to DC compression wrapping ASAP and complete compression garment measurements next visit. The majority of our session today was devoted to Pt edu re progress towa4rds wgoals and measuring comparative volumes. We'll complete garment measurements next session,.    Occupational Profile and client history currently impacting functional performance  Pt states episodes of severe depression may impact participation in OT    Occupational performance deficits (Please refer to evaluation for details):  ADL's;IADL's;Work;Leisure;Social Participation;Other decreased body image    Rehab Potential  Good    OT Frequency  3x / week    OT Duration  12 weeks    OT Treatment/Interventions  Self-care/ADL training;Therapeutic exercise;Manual lymph drainage;Coping strategies training;Compression bandaging;Patient/family education;Other (comment);Energy conservation;Therapist, nutritional;Therapeutic activities;DME and/or AE instruction;Manual Therapy    Plan  Fit with knee length BLE compression garments for  daytime use and knee length HOS device to limit fibrosis formation and improve lymphatic function during HOS.    Clinical Decision Making  Several treatment options, min-mod task modification necessary    Consulted and Agree with Plan of Care  Patient       Patient will benefit from skilled therapeutic intervention in order to improve the  following deficits and impairments:  Abnormal gait, Decreased knowledge of use of DME, Decreased skin integrity, Increased edema, Impaired flexibility, Pain, Decreased mobility, Impaired sensation, Decreased coping skills, Decreased range of motion, Decreased activity tolerance, Decreased knowledge of precautions, Difficulty walking, Obesity  Visit Diagnosis: Lymphedema, not elsewhere classified    Problem List Patient Active Problem List   Diagnosis Date Noted  . Cutaneous skin tags 08/22/2017  . Eating disorder 05/23/2017  . Abdominal pain, right upper quadrant 05/01/2017  . Gallstones 04/19/2017  . Diabetic polyneuropathy associated with type 2 diabetes mellitus ([place]) 04/07/2017  . Diabetic angiopathy ([place]) 04/07/2017  . Vasomotor symptoms due to menopause 04/07/2017  . Mobility impaired 11/12/2016  . Diarrhea 09/05/2016  . Urinary incontinence 12/02/2015  . Candidal intertrigo 04/06/2015  . Cystocele 04/06/2015  . Constipation 04/06/2015  . Back pain 12/28/2014  . Hypertriglyceridemia 10/14/2014  . Breast mass in female 07/28/2014  . Fall 07/23/2013  . Great toe pain 05/12/2013  . Medication management 11/20/2012  . Abdominal pain 04/23/2012  . Uric acid kidney stone 11/20/2011  . HYPERHIDROSIS 11/07/2009  . Menopausal symptoms 06/17/2009  . Type 2 diabetes mellitus without complication, without long-term current use of insulin ([place]) 08/23/2008  . VITAMIN D DEFICIENCY 05/07/2008  . VITAMIN B12 DEFICIENCY 06/04/2007  . CHRONIC FATIGUE SYNDROME 11/15/2006  . DIVERTICULITIS, HX OF 11/15/2006  . PCOS (polycystic ovarian syndrome) 10/16/2006  . Hyperlipidemia LDL goal <100 10/16/2006  . Morbid obesity with BMI of 50.0-59.9, adult ([place]) 10/16/2006  . Generalized anxiety disorder 10/16/2006  . PANIC DISORDER 10/16/2006  . BULIMIA 10/16/2006  . Chronic depression 10/16/2006  . CARPAL TUNNEL SYNDROME 10/16/2006  . LYMPHEDEMA 10/16/2006  . Allergic rhinitis 10/16/2006  .  Mild intermittent asthma 10/16/2006  . TMJ SYNDROME 10/16/2006  . GERD 10/16/2006  . IRRITABLE BOWEL SYNDROME 10/16/2006  . OSTEOARTHRITIS 10/16/2006  . Myalgia and myositis 10/16/2006  . COUGH, CHRONIC 10/16/2006    Andrey Spearman, MS, OTR/L, [facility] 08/26/17 4:54 PM   [place] MAIN [facility] SERVICES [address] [place], [place], [address] Phone: [phone]   Fax:  [phone]  Name: Mackenzie Bonilla MRN: [id number] Date of Birth: 07-06-1965

## 2017-08-27 ENCOUNTER — Encounter: Payer: Medicare Other | Admitting: Occupational Therapy

## 2017-08-28 ENCOUNTER — Ambulatory Visit: Payer: Self-pay | Admitting: *Deleted

## 2017-08-29 ENCOUNTER — Ambulatory Visit: Payer: Medicare Other | Admitting: Occupational Therapy

## 2017-08-29 DIAGNOSIS — I890 Lymphedema, not elsewhere classified: Secondary | ICD-10-CM | POA: Diagnosis not present

## 2017-08-29 NOTE — Therapy (Signed)
[place] MAIN [facility] SERVICES [address] [place], [place], [address] Phone: [phone]   Fax:  [phone]  Occupational Therapy Treatment  Patient Details  Name: Mackenzie Bonilla MRN: [id number] Date of Birth: 02-26-1965 No data recorded  Encounter Date: 08/29/2017  OT End of Session - 08/29/17 1457    Visit Number  9    Number of Visits  36    Date for OT Re-Evaluation  10/22/17    OT Start Time  0100    OT Stop Time  0215    OT Time Calculation (min)  75 min    Activity Tolerance  Patient tolerated treatment well;No increased pain    Behavior During Therapy  WFL for tasks assessed/performed       Past Medical History:  Diagnosis Date  . Allergy    allergic rhinitis  . Anemia   . Anxiety   . Arthritis    osteoarthritis  . Asthma   . Claustrophobia    does not like oxygen mask covering face  . Depression   . Diabetes mellitus without complication ([place])   . Fall 2016  . Fatty liver 07/2008   abd. ultrasound - fatty liver ; slt dilated cbd (no stones ) 06/10// abd.  ultrasound normal on 04/2006  . Fibromyalgia   . GERD (gastroesophageal reflux disease)    EGD negative 06/2001// EGD erythematous mucosa, polyp 08/2008  . High uric acid in 24 hour urine specimen   . Hyperhidrosis   . Hyperlipidemia   . Kidney stones   . Lymphedema   . Neuromuscular disorder (HCC)    fibromyalgia  . Obesity   . Shortness of breath dyspnea   . Tachycardia   . TMJ (dislocation of temporomandibular joint)   . UTI (lower urinary tract infection)     Past Surgical History:  Procedure Laterality Date  . BREAST BIOPSY Right 2016   neg  . BREAST LUMPECTOMY WITH RADIOACTIVE SEED LOCALIZATION Right 08/02/2014   Procedure: BREAST LUMPECTOMY WITH RADIOACTIVE SEED LOCALIZATION;  Surgeon: Rolm Bookbinder, MD;  Location: [facility];  Service: General;  Laterality: Right;  . COLONOSCOPY  08/12/2012   Completed by Dr. Phylis Bougie, normal exam.   .  ENDOMETRIAL BIOPSY  01/2004  . ESOPHAGOGASTRODUODENOSCOPY    . FOOT SURGERY    . SEPTOPLASTY     two times  . TONSILLECTOMY    . UTERINE FIBROID SURGERY  06/2005   ablation  . uterine tumor  09/2001  . VAGUS NERVE STIMULATOR INSERTION      There were no vitals filed for this visit.  Subjective Assessment - 08/29/17 1452    Subjective   Ms. Carmon is here for OT visit 9/36 for Intensive Phase CDT to BLE, with inital emphasis on LLE. Pt in agreement  with plan to complete customm compression garments and HOS device measurements for BLE today.    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 By definition, this disease becomes clinically evident after birth and before age 68 years. L ankle fx 2017, obesity, depression, OA- B knees, chromnic back pain, type II diabetes     Limitations  chronic leg pain and swelling, impaired standing tolerance, difficulty fitting LB clothing and street shoes,     Repetition  Increases Symptoms    Patient Stated Goals  get swelling down and relieve leg pain so  I can do more    Currently in Pain?  Yes chronic neuropathic pain unchanged    Pain Location  Leg    Pain Orientation  Right;Left    Pain Descriptors / Indicators  Heaviness;Numbness;Tender;Discomfort;Pins and needles;Tightness;Tiring    Pain Type  Chronic pain;Neuropathic pain    Pain Onset  In the past 7 days                   OT Treatments/Exercises (OP) - 08/29/17 0001      ADLs   ADL Education Given  Yes      Manual Therapy   Manual Therapy  Edema management;Compression Bandaging    Manual therapy comments  completed   garment measurements    Compression Bandaging  2 layer gradient compressino wrap to LLE             OT Education - 08/29/17 1456    Education Details  Pt edu for compression garment and HOS devices specifications. Pt educated to  participate in        choice making for some non-clinical aspects of garments.    Person(s) Educated  Patient;Parent(s)    Methods  Explanation;Demonstration;Tactile cues;Verbal cues;Handout    Comprehension  Verbalized understanding;Returned demonstration;Verbal cues required;Tactile cues required;Need further instruction          OT Long Term Goals - 07/25/17 1030      OT LONG TERM GOAL #1   Title  Pt independent w/ lymphedema precautions/prevention principals and using printed reference to limit LE progression and infection risk.    Baseline  Max A    Time  2    Period  Weeks    Status  New    Target Date  -- 10th OT Rx visit      OT LONG TERM GOAL #2   Title  Lymphedema (LE) management/ self-care: Pt able to apply knee  length gradient compression wraps using correct techniques with modified independence ( extra time and assistive device/s PRN)   within 2 weeks to achieve optimal limb volume reduction during Intensive Phase of CDT and optimal self-management over time.    Baseline  max A    Time  2    Period  Weeks    Status  New    Target Date  -- 10th OT Rx visit      OT LONG TERM GOAL #3   Title  Lymphedema (LE) management/ self-care:  Pt to achieve at least 10%  limb volume reductions bilaterally  during Intensive Phase CDT to limit LE progression, to decrease infection risk and to improve functional performance in all occupational domains.     Baseline  max a    Time  12    Period  Weeks    Status  New    Target Date  10/22/17      OT LONG TERM GOAL #4   Title  Lymphedema (LE) management/ self-care:  Pt to tolerate daily compression garments and/ or HOS devices in keeping w/ prescribed wear regime within 1 week of issue date to restore normal limb shape to reduce tissue density and protein build up leading to progression.    Baseline  max A    Time  12    Period  Weeks    Status  New    Target Date  10/22/17      OT LONG TERM GOAL #5   Title  Lymphedema (LE)  Pain: Pt to report 25% reduction in discomfort/  pain described as LE  "heaviness", "fullness", tightness"" by DC to facilitate improved functional ambulation and mobility and increased independence with basic and instrumental ADLs.     Baseline  max A    Time  12    Period  Weeks    Status  New    Target Date  10/22/17      Long Term Additional Goals   Additional Long Term Goals  Yes      OT LONG TERM GOAL #6   Title  Lymphedema (LE) management/ self-care:  During Management Phase CDT Pt to sustain reduced limb volumes achieved during Intensive Phase CDT during Management Phase CDT within 3% using LE self-care home program components as directed ( simple self MLD, skin care, ther ex and daily/ nightly compression garments/ devices) to limit LE progression and further functional decline.    Baseline  max a    Time  6    Period  Months    Status  New    Target Date  01/21/18            Plan - 08/29/17 1457    Clinical Impression Statement  Completed anatomical measurements for BLE custom Elvarex compression knee highs, and Jobst RELAX HOD devices. Ordered flat knit, Elvarex classic ( ccl 3: 35-45 mmHg) garments with open toes and 2.5 silicone top bands. Ordered knee length Jobst relax to facilitate improved lymphatic function and to limit figbrosis formation durinh HOS. Applied compression wraps to LLE. Contas per POC.    Occupational Profile and client history currently impacting functional performance  Pt states episodes of severe depression may impact participation in OT    Occupational performance deficits (Please refer to evaluation for details):  ADL's;IADL's;Work;Leisure;Social Participation;Other decreased body image    Rehab Potential  Good    OT Frequency  3x / week    OT Duration  12 weeks    OT Treatment/Interventions  Self-care/ADL training;Therapeutic exercise;Manual lymph drainage;Coping strategies training;Compression bandaging;Patient/family education;Other  (comment);Energy conservation;Therapist, nutritional;Therapeutic activities;DME and/or AE instruction;Manual Therapy    Plan  Fit with knee length BLE compression garments for daytime use and knee length HOS device to limit fibrosis formation and improve lymphatic function during HOS.    Clinical Decision Making  Several treatment options, min-mod task modification necessary    Consulted and Agree with Plan of Care  Patient       Patient will benefit from skilled therapeutic intervention in order to improve the following deficits and impairments:  Abnormal gait, Decreased knowledge of use of DME, Decreased skin integrity, Increased edema, Impaired flexibility, Pain, Decreased mobility, Impaired sensation, Decreased coping skills, Decreased range of motion, Decreased activity tolerance, Decreased knowledge of precautions, Difficulty walking, Obesity  Visit Diagnosis: Lymphedema, not elsewhere classified    Problem List Patient Active Problem List   Diagnosis Date Noted  . Cutaneous skin tags 08/22/2017  . Eating disorder 05/23/2017  . Abdominal pain, right upper quadrant 05/01/2017  . Gallstones 04/19/2017  . Diabetic polyneuropathy associated with type 2 diabetes mellitus ([place]) 04/07/2017  . Diabetic angiopathy ([place]) 04/07/2017  . Vasomotor symptoms due to menopause 04/07/2017  . Mobility impaired 11/12/2016  . Diarrhea 09/05/2016  . Urinary incontinence 12/02/2015  . Candidal intertrigo 04/06/2015  . Cystocele 04/06/2015  . Constipation 04/06/2015  . Back pain 12/28/2014  . Hypertriglyceridemia 10/14/2014  . Breast mass in female 07/28/2014  . Fall 07/23/2013  . Great toe pain 05/12/2013  . Medication management 11/20/2012  . Abdominal pain 04/23/2012  .  Uric acid kidney stone 11/20/2011  . HYPERHIDROSIS 11/07/2009  . Menopausal symptoms 06/17/2009  . Type 2 diabetes mellitus without complication, without long-term current use of insulin ([place]) 08/23/2008  . VITAMIN D  DEFICIENCY 05/07/2008  . VITAMIN B12 DEFICIENCY 06/04/2007  . CHRONIC FATIGUE SYNDROME 11/15/2006  . DIVERTICULITIS, HX OF 11/15/2006  . PCOS (polycystic ovarian syndrome) 10/16/2006  . Hyperlipidemia LDL goal <100 10/16/2006  . Morbid obesity with BMI of 50.0-59.9, adult ([place]) 10/16/2006  . Generalized anxiety disorder 10/16/2006  . PANIC DISORDER 10/16/2006  . BULIMIA 10/16/2006  . Chronic depression 10/16/2006  . CARPAL TUNNEL SYNDROME 10/16/2006  . LYMPHEDEMA 10/16/2006  . Allergic rhinitis 10/16/2006  . Mild intermittent asthma 10/16/2006  . TMJ SYNDROME 10/16/2006  . GERD 10/16/2006  . IRRITABLE BOWEL SYNDROME 10/16/2006  . OSTEOARTHRITIS 10/16/2006  . Myalgia and myositis 10/16/2006  . COUGH, CHRONIC 10/16/2006    Andrey Spearman, MS, OTR/L, [facility] 08/29/17 3:01 PM  [place] MAIN [facility] SERVICES [address] [place], [place], [address] Phone: [phone]   Fax:  [phone]  Name: Mackenzie Bonilla MRN: [id number] Date of Birth: 10-27-1965

## 2017-09-02 ENCOUNTER — Other Ambulatory Visit: Payer: Self-pay

## 2017-09-02 ENCOUNTER — Ambulatory Visit: Payer: Medicare Other | Admitting: Gastroenterology

## 2017-09-02 ENCOUNTER — Encounter: Payer: Self-pay | Admitting: Gastroenterology

## 2017-09-02 VITALS — BP 112/76 | HR 73 | Resp 17 | Ht 64.0 in | Wt 333.6 lb

## 2017-09-02 DIAGNOSIS — K582 Mixed irritable bowel syndrome: Secondary | ICD-10-CM

## 2017-09-02 NOTE — Progress Notes (Signed)
Cephas Darby, MD [address]  [place]  [place], [place] [address]  Main: [phone]  Fax: [phone]    Gastroenterology Consultation  Referring Provider:     Abner Greenspan, MD Primary Care Physician:  Tower, Wynelle Fanny, MD Primary Gastroenterologist:  Dr. Cephas Darby Reason for Consultation:     Irritable bowel syndrome        HPI:   Mackenzie Bonilla is a 52 y.o. female referred by Dr. Glori Bickers, Wynelle Fanny, MD  for consultation & management of irritable bowel syndrome. Patient is morbidly obese female with metabolic syndrome, multiple psychiatric conditions has been suffering from chronic gastrointestinal symptoms including alternating episodes of diarrhea and constipation, nonbloody stools, abdominal bloating and cramps, nausea, heartburn. She is working with her psychiatric is closely with different medications and cognitive behavioral therapy. She is currently on omeprazole 20 mg twice daily for heartburn and nausea. She does consume fried foods, snacks, junk foods and red meat. She also has history of eating disorder, bulimia nervosa. She had stool studies in the past negative for infection. Unclear if patient was ever tested for celiac disease or H. Pylori infection. Her TSH is normal  Follow-up visit 09/02/2017 She continues to have ongoing symptoms of alternating diarrhea and constipation, bloating, upper abdominal discomfort. H. Pylori breath test came back negative, celiac serologies were negative. I tried IB guard which did not help. She tried dicyclomine which resulted in worsening of diarrhea. She is seeing a nutritionist who is helping with her eating habits. She reports that she would have one of the above symptoms almost on a daily basis.she is interested to try probiotics. She did not visit bariatric wellness Center at [facility]. She is currently on Prozac, Wellbutrin, Xanax. She is worried about going out and especially traveling for long hours due to diarrhea  and not able to find a bathroom. She reports that she tries to avoid dairy as it makes her symptoms worse  NSAIDs: none  Antiplts/Anticoagulants/Anti thrombotics: none  GI Procedures:  She denies family history of GI malignancy Had a colonoscopy in 08/2012, normal exam and 08/2008, polyp was removed EGD 10/2001 normal  Past Medical History:  Diagnosis Date  . Allergy    allergic rhinitis  . Anemia   . Anxiety   . Arthritis    osteoarthritis  . Asthma   . Claustrophobia    does not like oxygen mask covering face  . Depression   . Diabetes mellitus without complication ([place])   . Fall 2016  . Fatty liver 07/2008   abd. ultrasound - fatty liver ; slt dilated cbd (no stones ) 06/10// abd.  ultrasound normal on 04/2006  . Fibromyalgia   . GERD (gastroesophageal reflux disease)    EGD negative 06/2001// EGD erythematous mucosa, polyp 08/2008  . High uric acid in 24 hour urine specimen   . Hyperhidrosis   . Hyperlipidemia   . Kidney stones   . Lymphedema   . Neuromuscular disorder (HCC)    fibromyalgia  . Obesity   . Shortness of breath dyspnea   . Tachycardia   . TMJ (dislocation of temporomandibular joint)   . UTI (lower urinary tract infection)     Past Surgical History:  Procedure Laterality Date  . BREAST BIOPSY Right 2016   neg  . BREAST LUMPECTOMY WITH RADIOACTIVE SEED LOCALIZATION Right 08/02/2014   Procedure: BREAST LUMPECTOMY WITH RADIOACTIVE SEED LOCALIZATION;  Surgeon: Rolm Bookbinder, MD;  Location: [place];  Service: General;  Laterality: Right;  . COLONOSCOPY  08/12/2012   Completed by Dr. Phylis Bougie, normal exam.   . ENDOMETRIAL BIOPSY  01/2004  . ESOPHAGOGASTRODUODENOSCOPY    . FOOT SURGERY    . SEPTOPLASTY     two times  . TONSILLECTOMY    . UTERINE FIBROID SURGERY  06/2005   ablation  . uterine tumor  09/2001  . VAGUS NERVE STIMULATOR INSERTION      Current Outpatient Medications:  .  ACCU-CHEK AVIVA PLUS test strip, Use to check blood sugar  once a  day, Disp: 100 each, Rfl: 0 .  ACCU-CHEK SOFTCLIX LANCETS lancets, Use to check blood sugar  once a day, Disp: 100 each, Rfl: 0 .  acetaminophen (TYLENOL) 500 MG tablet, Take 500 mg by mouth every 6 (six) hours as needed. , Disp: , Rfl:  .  albuterol (PROVENTIL HFA;VENTOLIN HFA) 108 (90 Base) MCG/ACT inhaler, Inhale 2 puffs into the lungs every 4 (four) hours as needed for wheezing., Disp: 1 Inhaler, Rfl: 3 .  ALPRAZolam (XANAX) 1 MG tablet, Take 2 mg by mouth 3 (three) times daily as needed. , Disp: , Rfl:  .  benzonatate (TESSALON) 200 MG capsule, TAKE 1 CAPSULES BY MOUTH 3 TIMES DAILY AS NEEDED FOR COUGH, Disp: 90 capsule, Rfl: 2 .  Blood Glucose Calibration (ACCU-CHEK AVIVA) SOLN, , Disp: , Rfl:  .  buPROPion (WELLBUTRIN XL) 150 MG 24 hr tablet, , Disp: , Rfl:  .  Cetirizine HCl 10 MG CAPS, Take 1 capsule by mouth daily., Disp: , Rfl:  .  cyclobenzaprine (FLEXERIL) 5 MG tablet, TAKE ONE TABLET THREE TIMES A DAY IF NEEDED FOR MUSCLE SPASM, Disp: 30 tablet, Rfl: 3 .  dimenhyDRINATE (DRAMAMINE) 50 MG tablet, Take 50 mg by mouth every 8 (eight) hours as needed., Disp: , Rfl:  .  fluconazole (DIFLUCAN) 150 MG tablet, Take 1 tablet (150 mg total) by mouth once a week., Disp: 4 tablet, Rfl: 0 .  FLUoxetine (PROZAC) 40 MG capsule, Take 80 mg by mouth daily., Disp: , Rfl:  .  fluticasone (FLONASE ALLERGY RELIEF) 50 MCG/ACT nasal spray, Place into both nostrils daily., Disp: , Rfl:  .  gabapentin (NEURONTIN) 100 MG capsule, Take 2 capsules (200 mg total) by mouth 2 (two) times daily., Disp: 360 capsule, Rfl: 3 .  gabapentin (NEURONTIN) 300 MG capsule, Take 1 capsule (300 mg total) by mouth at bedtime., Disp: 90 capsule, Rfl: 3 .  glipiZIDE (GLUCOTROL XL) 5 MG 24 hr tablet, Take 5 mg by mouth daily with breakfast., Disp: , Rfl:  .  ibuprofen (ADVIL,MOTRIN) 600 MG tablet, Take 1 tablet (600 mg total) by mouth every 8 (eight) hours as needed., Disp: 30 tablet, Rfl: 0 .  losartan (COZAAR) 25 MG tablet, ,  Disp: , Rfl:  .  omeprazole (PRILOSEC) 20 MG capsule, TAKE 1 CAPSULE BY MOUTH TWO TIMES DAILY, Disp: 180 capsule, Rfl: 1 .  oxybutynin (DITROPAN XL) 15 MG 24 hr tablet, TAKE ONE TABLET BY MOUTH EVERY DAY, Disp: 90 tablet, Rfl: 0 .  pravastatin (PRAVACHOL) 20 MG tablet, TAKE 1 TABLET BY MOUTH NIGHTLY, Disp: 30 tablet, Rfl: 5 .  Simethicone (GAS-X PO), Take by mouth., Disp: , Rfl:  .  spironolactone (ALDACTONE) 100 MG tablet, Take 1 tablet (100 mg total) by mouth daily., Disp: 30 tablet, Rfl: 3 .  tamsulosin (FLOMAX) 0.4 MG CAPS capsule, Take 1 capsule (0.4 mg total) by mouth daily., Disp: 30 capsule, Rfl: 0 .  traMADol (ULTRAM) 50 MG tablet, Take  1 tablet (50 mg total) by mouth 2 (two) times daily as needed., Disp: 30 tablet, Rfl: 0 .  traZODone (DESYREL) 150 MG tablet, Take 450 mg by mouth at bedtime. , Disp: , Rfl:  .  TRULICITY 1.5 CX/4.4YJ SOPN, INJECT CONTENTS OF 1 PEN ONCE A WEEK, Disp: 2 mL, Rfl: 3 .  cholecalciferol (VITAMIN D) 1000 units tablet, Take 5,000 Units by mouth daily., Disp: , Rfl:  .  Ergocalciferol (VITAMIN D2 PO), Take by mouth once a week., Disp: , Rfl:  .  furosemide (LASIX) 20 MG tablet, Take 1 tablet (20 mg total) by mouth daily. Prn edema (Patient not taking: Reported on 09/02/2017), Disp: 30 tablet, Rfl: 3 .  ketoconazole (NIZORAL) 2 % cream, Apply 1 application topically daily. To affected area (Patient not taking: Reported on 09/02/2017), Disp: 60 g, Rfl: 1 .  loratadine (CLARITIN) 10 MG tablet, Take by mouth., Disp: , Rfl:  .  nystatin (MYCOSTATIN/NYSTOP) powder, Apply topically 2 (two) times daily. To affected areas of yeast skin infection (between skin folds) (Patient not taking: Reported on 09/02/2017), Disp: 60 g, Rfl: 3 .  talc (ZEASORB) powder, Apply topically as needed. (Patient not taking: Reported on 09/02/2017), Disp: 240 g, Rfl: 3 .  triamcinolone cream (KENALOG) 0.1 %, , Disp: , Rfl:  .  vitamin B-12 (CYANOCOBALAMIN) 1000 MCG tablet, Take 1,000 mcg by mouth  daily., Disp: , Rfl:    Family History  Problem Relation Age of Onset  . Hypertension Father   . Cancer Father        pancreatic cancer  . Hypertension Sister   . Heart disease Maternal Grandmother 60       MI  . Diabetes Maternal Grandmother   . Heart disease Paternal Grandmother 35       MI  . Diabetes Paternal Grandmother   . Breast cancer Neg Hx      Social History   Tobacco Use  . Smoking status: Never Smoker  . Smokeless tobacco: Never Used  Substance Use Topics  . Alcohol use: No    Alcohol/week: 0.0 oz  . Drug use: No    Allergies as of 09/02/2017 - Review Complete 09/02/2017  Allergen Reaction Noted  . Cephalexin  10/16/2006  . Codeine  10/16/2006  . Duloxetine  10/16/2006  . Levaquin [levofloxacin] Nausea Only 12/25/2011  . Lisinopril Cough 01/16/2017  . Metformin and related Diarrhea 11/14/2011  . Prednisone  10/16/2006  . Quetiapine Other (See Comments) 10/16/2006  . Sertraline hcl    . Simvastatin Other (See Comments) 07/05/2010  . Triazolam  10/16/2006  . Zaleplon  10/16/2006  . Zocor [simvastatin - high dose]  07/05/2010  . Zolpidem tartrate  10/16/2006  . Hydrocodone Rash 10/16/2006  . Norelgestromin-eth estradiol  10/16/2006    Review of Systems:    All systems reviewed and negative except where noted in HPI.   Physical Exam:  BP 112/76 (BP Location: Left Arm, Patient Position: Sitting, Cuff Size: Large)   Pulse 73   Resp 17   Ht 5\' 4"  (1.626 m)   Wt (!) 333 lb 9.6 oz (151.3 kg)   BMI 57.26 kg/m  No LMP recorded. Patient has had an ablation.  General:   Alert,  Well-developed, well-nourished, pleasant and cooperative in NAD Head:  Normocephalic and atraumatic, double Chin, short neck. Eyes:  Sclera clear, no icterus.   Conjunctiva pink. Ears:  Normal auditory acuity. Nose:  No deformity, discharge, or lesions. Mouth:  No deformity or lesions,oropharynx pink &  moist. Neck:  Supple; no masses or thyromegaly. Lungs:  Respirations even  and unlabored.  Clear throughout to auscultation.   No wheezes, crackles, or rhonchi. No acute distress. Heart:  Regular rate and rhythm; no murmurs, clicks, rubs, or gallops. Abdomen:  Normal bowel sounds. Soft, severe obesity, non-tender and non-distended, limited abdominal exam due to her body habitus  No guarding or rebound tenderness.   Rectal: Not performed Msk:  Symmetrical without gross deformities. Good, equal movement & strength bilaterally. Pulses:  Normal pulses noted. Extremities:  No clubbing or edema.  No cyanosis. Neurologic:  Alert and oriented x3;  grossly normal neurologically. Skin:  Dry scaly skin in her upper extremities No jaundice. Psych:  Alert and cooperative. Normal mood and affect.  Imaging Studies: reviewed  Assessment and Plan:   AUDRIONNA LAMPTON is a 52 y.o. Caucasian female with metabolic syndrome, BMI 56, fatty liver disease, multiple underlying psychiatric illnesses with chronic symptoms of alternating episodes of diarrhea constipation, abdominal bloating, cramps, heartburn and globus sensation. Her symptoms are highly suggestive of functional GI disorder, irritable bowel syndrome-mixed type. Discussed in length with her about management of IBS including combination of dietary modification, medications, managing her underlying psychiatric illnesses, cognitive behavioral therapy and importantly weight loss.  - will try probiotics - discussed with her about low FODMAPS diet - screening colonoscopy due in 2024  Follow up in 2 months   Cephas Darby, MD

## 2017-09-03 ENCOUNTER — Ambulatory Visit: Payer: Medicare Other | Admitting: Occupational Therapy

## 2017-09-03 DIAGNOSIS — I890 Lymphedema, not elsewhere classified: Secondary | ICD-10-CM

## 2017-09-03 NOTE — Therapy (Signed)
[place] MAIN [facility] SERVICES [address] [place], [place], [address] Phone: [phone]   Fax:  [phone]  Occupational Therapy Treatment Note and Progress Report  Patient Details  Name: Mackenzie Bonilla MRN: [id number] Date of Birth: 1965-04-03 No data recorded  Encounter Date: 09/03/2017  OT End of Session - 09/03/17 1426    Visit Number  10    Number of Visits  36    Date for OT Re-Evaluation  10/22/17    OT Start Time  0100    OT Stop Time  0215    OT Time Calculation (min)  75 min    Activity Tolerance  Patient tolerated treatment well;No increased pain    Behavior During Therapy  WFL for tasks assessed/performed       Past Medical History:  Diagnosis Date  . Allergy    allergic rhinitis  . Anemia   . Anxiety   . Arthritis    osteoarthritis  . Asthma   . Claustrophobia    does not like oxygen mask covering face  . Depression   . Diabetes mellitus without complication ([place])   . Fall 2016  . Fatty liver 07/2008   abd. ultrasound - fatty liver ; slt dilated cbd (no stones ) 06/10// abd.  ultrasound normal on 04/2006  . Fibromyalgia   . GERD (gastroesophageal reflux disease)    EGD negative 06/2001// EGD erythematous mucosa, polyp 08/2008  . High uric acid in 24 hour urine specimen   . Hyperhidrosis   . Hyperlipidemia   . Kidney stones   . Lymphedema   . Neuromuscular disorder (HCC)    fibromyalgia  . Obesity   . Shortness of breath dyspnea   . Tachycardia   . TMJ (dislocation of temporomandibular joint)   . UTI (lower urinary tract infection)     Past Surgical History:  Procedure Laterality Date  . BREAST BIOPSY Right 2016   neg  . BREAST LUMPECTOMY WITH RADIOACTIVE SEED LOCALIZATION Right 08/02/2014   Procedure: BREAST LUMPECTOMY WITH RADIOACTIVE SEED LOCALIZATION;  Surgeon: Rolm Bookbinder, MD;  Location: [place];  Service: General;  Laterality: Right;  . COLONOSCOPY  08/12/2012   Completed by Dr. Phylis Bougie,  normal exam.   . ENDOMETRIAL BIOPSY  01/2004  . ESOPHAGOGASTRODUODENOSCOPY    . FOOT SURGERY    . SEPTOPLASTY     two times  . TONSILLECTOMY    . UTERINE FIBROID SURGERY  06/2005   ablation  . uterine tumor  09/2001  . VAGUS NERVE STIMULATOR INSERTION      There were no vitals filed for this visit.  Subjective Assessment - 09/03/17 1319    Subjective   Mackenzie Bonilla is here for OT visit 10/36 for Intensive Phase CDT to BLE, with inital emphasis on LLE. Pt ireports that DME vendor called during visit interval and informed her that her BCBS Medicare benefits do not cover recommended custom compression garments and devices. Pt  states she is anxious to obtain recommened compression. Pt and therapist discussed charitable funding thru [facility] that can assist her with obtaining 1 pr opf cmpression garments.    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 By definition, this disease becomes clinically evident after birth and before age 59 years. L ankle fx 2017, obesity, depression, OA- B knees, chromnic back pain, type II diabetes  Limitations  chronic leg pain and swelling, impaired standing tolerance, difficulty fitting LB clothing and street shoes,     Repetition  Increases Symptoms    Patient Stated Goals  get swelling down and relieve leg pain so I can do more    Currently in Pain?  Yes knees, lower back, neck and shoulders- chronic pain  not rated    Pain Onset  In the past 7 days                   OT Treatments/Exercises (OP) - 09/03/17 0001      ADLs   ADL Education Given  Yes      Manual Therapy   Manual Therapy  Edema management;Compression Bandaging    Edema Management  skin care toLLE w/ low ph castor oil during MLD, and with low ph Eucerin lotio0n prior to reapplying wraps.    Manual Lymphatic Drainage (MLD)  MLD to LLE utilizing deep  abdominal lymphatics, short neck sequence, functional inguinal LN and popliteal LN in supine. Pt instructed for diaphragmatic breathing, J stroke and sh9ort neck sequence. Handouts given.     Compression Bandaging  2 layer gradient compressino wrap to LLE             OT Education - 09/03/17 1424    Education Details  Assisted Pt w/ ordering compression garments and devices after explaining `Medicare's policy for non funding this important DME.Assisted Pt to complete application for charitable funds through health care system to assist w/ compression garments.    Person(s) Educated  Patient;Parent(s)    Methods  Explanation;Demonstration;Tactile cues;Verbal cues;Handout    Comprehension  Verbalized understanding;Returned demonstration;Verbal cues required;Tactile cues required;Need further instruction          OT Long Term Goals - 09/03/17 1427      OT LONG TERM GOAL #1   Title  Pt independent w/ lymphedema precautions/prevention principals and using printed reference to limit LE progression and infection risk.    Baseline  Max A    Time  2    Period  Weeks    Status  Achieved      OT LONG TERM GOAL #2   Title  Lymphedema (LE) management/ self-care: Pt able to apply knee  length gradient compression wraps using correct techniques with modified independence ( extra time and assistive device/s PRN)   within 2 weeks to achieve optimal limb volume reduction during Intensive Phase of CDT and optimal self-management over time.    Baseline  max A  09/03/17: Pt unable to apply compression wraps. Mother assists her during visit intervals    Time  2    Period  Weeks    Status  Not Met      OT LONG TERM GOAL #3   Title  Lymphedema (LE) management/ self-care:  Pt to achieve at least 10%  limb volume reductions bilaterally  during Intensive Phase CDT to limit LE progression, to decrease infection risk and to improve functional performance in all occupational domains.     Baseline  max a   09/03/17: To date LLE limb volume reduction actually measures as a 2.01% increase below the knee despite consistent compression wraps and clinical MLD. Pt and therapist agree on plan to fit compression garments ASAP to maximize mobility.    Time  12    Period  Weeks    Status  Partially Met      OT LONG TERM GOAL #4   Title  Lymphedema (LE)  management/ self-care:  Pt to tolerate daily compression garments and/ or HOS devices in keeping w/ prescribed wear regime within 1 week of issue date to restore normal limb shape to reduce tissue density and protein build up leading to progression.    Baseline  max A 09/03/17: Garments on order. Not yet fit    Time  12    Period  Weeks    Status  On-going      OT LONG TERM GOAL #5   Title  Lymphedema (LE) Pain: Pt to report 25% reduction in discomfort/ pain described as LE  "heaviness", "fullness", tightness"" by DC to facilitate improved functional ambulation and mobility and increased independence with basic and instrumental ADLs.     Baseline  max A    Time  12    Period  Weeks    Status  Partially Met      OT LONG TERM GOAL #6   Title  Lymphedema (LE) management/ self-care:  During Management Phase CDT Pt to sustain reduced limb volumes achieved during Intensive Phase CDT during Management Phase CDT within 3% using LE self-care home program components as directed ( simple self MLD, skin care, ther ex and daily/ nightly compression garments/ devices) to limit LE progression and further functional decline.    Baseline  max a    Time  6    Period  Months    Status  On-going            Plan - 09/03/17 1433    Clinical Impression Statement  Pt demonstrates progress towards some goals but not all.  Limb volume reduction goal os 10% is not met to date despite diligent self care and clinical particvpation. Pt is opting to fit compression garments and devices ASAP to limit continued  compression bandaging. Pt is not able to apply compression wraps  herselfm, but her mother consistently wraps q day during visit intervals. Pt is I with LE precautions. Paion reduction    and long term management goals are ongoing. Overall response to CDT has been slow.  Assisted Pt with compression garment order through specified DME vendor while performing MLD at the sdame time. Appplied compression wraps . Pt  tearful during Rx due to frustration over insurance not providing coverage for custom compression. Cont as per POC.    Occupational Profile and client history currently impacting functional performance  Pt states episodes of severe depression may impact participation in OT    Occupational performance deficits (Please refer to evaluation for details):  ADL's;IADL's;Work;Leisure;Social Participation;Other decreased body image    Rehab Potential  Good    OT Frequency  3x / week    OT Duration  12 weeks    OT Treatment/Interventions  Self-care/ADL training;Therapeutic exercise;Manual lymph drainage;Coping strategies training;Compression bandaging;Patient/family education;Other (comment);Energy conservation;Therapist, nutritional;Therapeutic activities;DME and/or AE instruction;Manual Therapy    Plan  Fit with knee length BLE compression garments for daytime use and knee length HOS device to limit fibrosis formation and improve lymphatic function during HOS.    Clinical Decision Making  Several treatment options, min-mod task modification necessary    Consulted and Agree with Plan of Care  Patient       Patient will benefit from skilled therapeutic intervention in order to improve the following deficits and impairments:  Abnormal gait, Decreased knowledge of use of DME, Decreased skin integrity, Increased edema, Impaired flexibility, Pain, Decreased mobility, Impaired sensation, Decreased coping skills, Decreased range of motion, Decreased activity tolerance, Decreased knowledge of precautions,  Difficulty walking, Obesity  Visit Diagnosis: Lymphedema, not  elsewhere classified    Problem List Patient Active Problem List   Diagnosis Date Noted  . Cutaneous skin tags 08/22/2017  . Eating disorder 05/23/2017  . Abdominal pain, right upper quadrant 05/01/2017  . Gallstones 04/19/2017  . Diabetic polyneuropathy associated with type 2 diabetes mellitus ([place]) 04/07/2017  . Diabetic angiopathy ([place]) 04/07/2017  . Vasomotor symptoms due to menopause 04/07/2017  . Mobility impaired 11/12/2016  . Diarrhea 09/05/2016  . Urinary incontinence 12/02/2015  . Candidal intertrigo 04/06/2015  . Cystocele 04/06/2015  . Constipation 04/06/2015  . Back pain 12/28/2014  . Hypertriglyceridemia 10/14/2014  . Breast mass in female 07/28/2014  . Fall 07/23/2013  . Great toe pain 05/12/2013  . Medication management 11/20/2012  . Abdominal pain 04/23/2012  . Uric acid kidney stone 11/20/2011  . HYPERHIDROSIS 11/07/2009  . Menopausal symptoms 06/17/2009  . Type 2 diabetes mellitus without complication, without long-term current use of insulin ([place]) 08/23/2008  . VITAMIN D DEFICIENCY 05/07/2008  . VITAMIN B12 DEFICIENCY 06/04/2007  . CHRONIC FATIGUE SYNDROME 11/15/2006  . DIVERTICULITIS, HX OF 11/15/2006  . PCOS (polycystic ovarian syndrome) 10/16/2006  . Hyperlipidemia LDL goal <100 10/16/2006  . Morbid obesity with BMI of 50.0-59.9, adult ([place]) 10/16/2006  . Generalized anxiety disorder 10/16/2006  . PANIC DISORDER 10/16/2006  . BULIMIA 10/16/2006  . Chronic depression 10/16/2006  . CARPAL TUNNEL SYNDROME 10/16/2006  . LYMPHEDEMA 10/16/2006  . Allergic rhinitis 10/16/2006  . Mild intermittent asthma 10/16/2006  . TMJ SYNDROME 10/16/2006  . GERD 10/16/2006  . IRRITABLE BOWEL SYNDROME 10/16/2006  . OSTEOARTHRITIS 10/16/2006  . Myalgia and myositis 10/16/2006  . COUGH, CHRONIC 10/16/2006    Andrey Spearman, MS, OTR/L, [facility] 09/03/17 2:38 PM  [place] MAIN [facility] SERVICES [address] [place],  [place], [address] Phone: [phone]   Fax:  [phone]  Name: Mackenzie Bonilla MRN: [id number] Date of Birth: [birth year]

## 2017-09-04 ENCOUNTER — Ambulatory Visit: Payer: Self-pay | Admitting: *Deleted

## 2017-09-05 ENCOUNTER — Ambulatory Visit: Payer: Medicare Other | Admitting: Occupational Therapy

## 2017-09-05 DIAGNOSIS — I890 Lymphedema, not elsewhere classified: Secondary | ICD-10-CM | POA: Diagnosis not present

## 2017-09-05 NOTE — Therapy (Signed)
[place] MAIN [facility] SERVICES [address] [place], [place], [address] Phone: [phone]   Fax:  [phone]  Occupational Therapy Treatment  Patient Details  Name: Mackenzie Bonilla MRN: [id number] Date of Birth: 08-01-1965 No data recorded  Encounter Date: 09/05/2017  OT End of Session - 09/05/17 1424    Visit Number  11    Number of Visits  36    Date for OT Re-Evaluation  10/22/17    OT Start Time  0100    OT Stop Time  0200    OT Time Calculation (min)  60 min    Activity Tolerance  Patient tolerated treatment well;No increased pain    Behavior During Therapy  WFL for tasks assessed/performed       Past Medical History:  Diagnosis Date  . Allergy    allergic rhinitis  . Anemia   . Anxiety   . Arthritis    osteoarthritis  . Asthma   . Claustrophobia    does not like oxygen mask covering face  . Depression   . Diabetes mellitus without complication ([place])   . Fall 2016  . Fatty liver 07/2008   abd. ultrasound - fatty liver ; slt dilated cbd (no stones ) 06/10// abd.  ultrasound normal on 04/2006  . Fibromyalgia   . GERD (gastroesophageal reflux disease)    EGD negative 06/2001// EGD erythematous mucosa, polyp 08/2008  . High uric acid in 24 hour urine specimen   . Hyperhidrosis   . Hyperlipidemia   . Kidney stones   . Lymphedema   . Neuromuscular disorder (HCC)    fibromyalgia  . Obesity   . Shortness of breath dyspnea   . Tachycardia   . TMJ (dislocation of temporomandibular joint)   . UTI (lower urinary tract infection)     Past Surgical History:  Procedure Laterality Date  . BREAST BIOPSY Right 2016   neg  . BREAST LUMPECTOMY WITH RADIOACTIVE SEED LOCALIZATION Right 08/02/2014   Procedure: BREAST LUMPECTOMY WITH RADIOACTIVE SEED LOCALIZATION;  Surgeon: Rolm Bookbinder, MD;  Location: [facility];  Service: General;  Laterality: Right;  . COLONOSCOPY  08/12/2012   Completed by Dr. Phylis Bougie, normal exam.   .  ENDOMETRIAL BIOPSY  01/2004  . ESOPHAGOGASTRODUODENOSCOPY    . FOOT SURGERY    . SEPTOPLASTY     two times  . TONSILLECTOMY    . UTERINE FIBROID SURGERY  06/2005   ablation  . uterine tumor  09/2001  . VAGUS NERVE STIMULATOR INSERTION      There were no vitals filed for this visit.  Subjective Assessment - 09/05/17 1420    Subjective   Ms. Lyles is here for OT visit 11/36 for Intensive Phase CDT to BLE, with inital emphasis on LLE. Pt reports she spoke with insurance company yesterday and they assusred her that her compression garments  would be covered. Pt spoke with venor again and vendor assured her that they would not be covered.     Pertinent History  Primary LE dx at age 18 is consistent with LE Praecox, Lymphedema praecox, also known as Meige disease, is the most common form of primary lymphedema. By definition, this disease becomes clinically evident after birth and before age 60 years. L ankle fx 2017, obesity, depression, OA- B knees, chromnic back pain, type II diabetes     Limitations  chronic leg pain and swelling, impaired standing tolerance, difficulty fitting LB clothing and street shoes,     Repetition  Increases Symptoms    Patient Stated Goals  get swelling down and relieve leg pain so I can do more    Currently in Pain?  Yes chronic pain unchanged    Pain Onset  Other (comment) chronic    Pain Frequency  Intermittent                   OT Treatments/Exercises (OP) - 09/05/17 0001      ADLs   ADL Education Given  Yes      Manual Therapy   Manual Therapy  Edema management;Compression Bandaging;Manual Lymphatic Drainage (MLD)    Edema Management  skin care toLLE w/ low ph castor oil during MLD, and with low ph Eucerin lotio0n prior to reapplying wraps.    Manual Lymphatic Drainage (MLD)  MLD to LLE utilizing deep abdominal lymphatics, short neck sequence, functional inguinal LN and popliteal LN in supine. Pt instructed for diaphragmatic breathing, J  stroke and sh9ort neck sequence. Handouts given.     Compression Bandaging  2 layer gradient compressino wrap to LLE             OT Education - 09/05/17 1424    Education Details  Continued skilled Pt/caregiver education  And LE ADL training throughout visit for lymphedema self care/ home program, including kinesio tape wear and care, compression wrapping, compression garment and device wear/care, lymphatic pumping ther ex, simple self-MLD, and skin care. Discussed progress towards goals.     Person(s) Educated  Patient;Parent(s)    Methods  Explanation;Demonstration    Comprehension  Verbalized understanding;Returned demonstration          OT Long Term Goals - 09/03/17 1427      OT LONG TERM GOAL #1   Title  Pt independent w/ lymphedema precautions/prevention principals and using printed reference to limit LE progression and infection risk.    Baseline  Max A    Time  2    Period  Weeks    Status  Achieved      OT LONG TERM GOAL #2   Title  Lymphedema (LE) management/ self-care: Pt able to apply knee  length gradient compression wraps using correct techniques with modified independence ( extra time and assistive device/s PRN)   within 2 weeks to achieve optimal limb volume reduction during Intensive Phase of CDT and optimal self-management over time.    Baseline  max A  09/03/17: Pt unable to apply compression wraps. Mother assists her during visit intervals    Time  2    Period  Weeks    Status  Not Met      OT LONG TERM GOAL #3   Title  Lymphedema (LE) management/ self-care:  Pt to achieve at least 10%  limb volume reductions bilaterally  during Intensive Phase CDT to limit LE progression, to decrease infection risk and to improve functional performance in all occupational domains.     Baseline  max a  09/03/17: To date LLE limb volume reduction actually measures as a 2.01% increase below the knee despite consistent compression wraps and clinical MLD. Pt and therapist agree  on plan to fit compression garments ASAP to maximize mobility.    Time  12    Period  Weeks    Status  Partially Met      OT LONG TERM GOAL #4   Title  Lymphedema (LE) management/ self-care:  Pt to tolerate daily compression garments and/ or HOS devices in keeping w/ prescribed wear regime within 1  week of issue date to restore normal limb shape to reduce tissue density and protein build up leading to progression.    Baseline  max A 09/03/17: Garments on order. Not yet fit    Time  12    Period  Weeks    Status  On-going      OT LONG TERM GOAL #5   Title  Lymphedema (LE) Pain: Pt to report 25% reduction in discomfort/ pain described as LE  "heaviness", "fullness", tightness"" by DC to facilitate improved functional ambulation and mobility and increased independence with basic and instrumental ADLs.     Baseline  max A    Time  12    Period  Weeks    Status  Partially Met      OT LONG TERM GOAL #6   Title  Lymphedema (LE) management/ self-care:  During Management Phase CDT Pt to sustain reduced limb volumes achieved during Intensive Phase CDT during Management Phase CDT within 3% using LE self-care home program components as directed ( simple self MLD, skin care, ther ex and daily/ nightly compression garments/ devices) to limit LE progression and further functional decline.    Baseline  max a    Time  6    Period  Months    Status  On-going            Plan - 09/05/17 1425    Clinical Impression Statement  Provided LLE MLD, skin care and compression therapy , in addtion to edu for LE self care. Pt tolerated all aspects of OT without difficulty today. Pt ordered recommended compression garments    from vendor. OT submitted charity application yesterday.Cont as per POC.    Occupational Profile and client history currently impacting functional performance  Pt states episodes of severe depression may impact participation in OT    Occupational performance deficits (Please refer to  evaluation for details):  ADL's;IADL's;Work;Leisure;Social Participation;Other decreased body image    Rehab Potential  Good    OT Frequency  3x / week    OT Duration  12 weeks    OT Treatment/Interventions  Self-care/ADL training;Therapeutic exercise;Manual lymph drainage;Coping strategies training;Compression bandaging;Patient/family education;Other (comment);Energy conservation;Therapist, nutritional;Therapeutic activities;DME and/or AE instruction;Manual Therapy    Plan  Fit with knee length BLE compression garments for daytime use and knee length HOS device to limit fibrosis formation and improve lymphatic function during HOS.    Clinical Decision Making  Several treatment options, min-mod task modification necessary    Consulted and Agree with Plan of Care  Patient       Patient will benefit from skilled therapeutic intervention in order to improve the following deficits and impairments:  Abnormal gait, Decreased knowledge of use of DME, Decreased skin integrity, Increased edema, Impaired flexibility, Pain, Decreased mobility, Impaired sensation, Decreased coping skills, Decreased range of motion, Decreased activity tolerance, Decreased knowledge of precautions, Difficulty walking, Obesity  Visit Diagnosis: Lymphedema, not elsewhere classified    Problem List Patient Active Problem List   Diagnosis Date Noted  . Cutaneous skin tags 08/22/2017  . Eating disorder 05/23/2017  . Abdominal pain, right upper quadrant 05/01/2017  . Gallstones 04/19/2017  . Diabetic polyneuropathy associated with type 2 diabetes mellitus ([place]) 04/07/2017  . Diabetic angiopathy ([place]) 04/07/2017  . Vasomotor symptoms due to menopause 04/07/2017  . Mobility impaired 11/12/2016  . Diarrhea 09/05/2016  . Urinary incontinence 12/02/2015  . Candidal intertrigo 04/06/2015  . Cystocele 04/06/2015  . Constipation 04/06/2015  . Back pain 12/28/2014  .  Hypertriglyceridemia 10/14/2014  . Breast mass in  female 07/28/2014  . Fall 07/23/2013  . Great toe pain 05/12/2013  . Medication management 11/20/2012  . Abdominal pain 04/23/2012  . Uric acid kidney stone 11/20/2011  . HYPERHIDROSIS 11/07/2009  . Menopausal symptoms 06/17/2009  . Type 2 diabetes mellitus without complication, without long-term current use of insulin ([place]) 08/23/2008  . VITAMIN D DEFICIENCY 05/07/2008  . VITAMIN B12 DEFICIENCY 06/04/2007  . CHRONIC FATIGUE SYNDROME 11/15/2006  . DIVERTICULITIS, HX OF 11/15/2006  . PCOS (polycystic ovarian syndrome) 10/16/2006  . Hyperlipidemia LDL goal <100 10/16/2006  . Morbid obesity with BMI of 50.0-59.9, adult ([place]) 10/16/2006  . Generalized anxiety disorder 10/16/2006  . PANIC DISORDER 10/16/2006  . BULIMIA 10/16/2006  . Chronic depression 10/16/2006  . CARPAL TUNNEL SYNDROME 10/16/2006  . LYMPHEDEMA 10/16/2006  . Allergic rhinitis 10/16/2006  . Mild intermittent asthma 10/16/2006  . TMJ SYNDROME 10/16/2006  . GERD 10/16/2006  . IRRITABLE BOWEL SYNDROME 10/16/2006  . OSTEOARTHRITIS 10/16/2006  . Myalgia and myositis 10/16/2006  . COUGH, CHRONIC 10/16/2006    Andrey Spearman, MS, OTR/L, [facility] 09/05/17 2:27 PM  [place] MAIN [facility] SERVICES [address] [place], [place], [address] Phone: [phone]   Fax:  [phone]  Name: Mackenzie Bonilla MRN: [id number] Date of Birth: Oct 21, 1965

## 2017-09-07 ENCOUNTER — Other Ambulatory Visit: Payer: Self-pay | Admitting: Family Medicine

## 2017-09-09 ENCOUNTER — Ambulatory Visit: Payer: Medicare Other | Admitting: Occupational Therapy

## 2017-09-09 DIAGNOSIS — I890 Lymphedema, not elsewhere classified: Secondary | ICD-10-CM | POA: Diagnosis not present

## 2017-09-09 NOTE — Therapy (Signed)
[place] MAIN [facility] SERVICES [address] [place], [place], [address] Phone: [phone]   Fax:  [phone]  Occupational Therapy Treatment  Patient Details  Name: Mackenzie Bonilla MRN: [id number] Date of Birth: May 10, 1965 No data recorded  Encounter Date: 09/09/2017  OT End of Session - 09/09/17 1626    Visit Number  12    Number of Visits  36    Date for OT Re-Evaluation  10/22/17    OT Start Time  0100    OT Stop Time  0215    OT Time Calculation (min)  75 min    Activity Tolerance  Patient tolerated treatment well;No increased pain    Behavior During Therapy  WFL for tasks assessed/performed       Past Medical History:  Diagnosis Date  . Allergy    allergic rhinitis  . Anemia   . Anxiety   . Arthritis    osteoarthritis  . Asthma   . Claustrophobia    does not like oxygen mask covering face  . Depression   . Diabetes mellitus without complication ([place])   . Fall 2016  . Fatty liver 07/2008   abd. ultrasound - fatty liver ; slt dilated cbd (no stones ) 06/10// abd.  ultrasound normal on 04/2006  . Fibromyalgia   . GERD (gastroesophageal reflux disease)    EGD negative 06/2001// EGD erythematous mucosa, polyp 08/2008  . High uric acid in 24 hour urine specimen   . Hyperhidrosis   . Hyperlipidemia   . Kidney stones   . Lymphedema   . Neuromuscular disorder (HCC)    fibromyalgia  . Obesity   . Shortness of breath dyspnea   . Tachycardia   . TMJ (dislocation of temporomandibular joint)   . UTI (lower urinary tract infection)     Past Surgical History:  Procedure Laterality Date  . BREAST BIOPSY Right 2016   neg  . BREAST LUMPECTOMY WITH RADIOACTIVE SEED LOCALIZATION Right 08/02/2014   Procedure: BREAST LUMPECTOMY WITH RADIOACTIVE SEED LOCALIZATION;  Surgeon: Rolm Bookbinder, MD;  Location: [place];  Service: General;  Laterality: Right;  . COLONOSCOPY  08/12/2012   Completed by Dr. Phylis Bougie, normal exam.   .  ENDOMETRIAL BIOPSY  01/2004  . ESOPHAGOGASTRODUODENOSCOPY    . FOOT SURGERY    . SEPTOPLASTY     two times  . TONSILLECTOMY    . UTERINE FIBROID SURGERY  06/2005   ablation  . uterine tumor  09/2001  . VAGUS NERVE STIMULATOR INSERTION      There were no vitals filed for this visit.  Subjective Assessment - 09/09/17 1623    Subjective   Ms. Severt is here for OT visit 12/36 for Intensive Phase CDT to BLE, with inital emphasis on LLE. Pt reports she had a touch weekend and she is teatful intermittently throughout session. Pt reports she removed compression wraps on Saturday because they had bunched around ankle and were huring. Pt reports she h is no longer asking her mother to assist with wraps, so she was not able to reapply them.    Pertinent History  Primary LE dx at age 45 is consistent with LE Praecox, Lymphedema praecox, also known as Meige disease, is the most common form of primary lymphedema. By definition, this disease becomes clinically evident after birth and before age 3 years. L ankle fx 2017, obesity, depression, OA- B knees, chromnic back pain, type II diabetes     Limitations  chronic leg pain  and swelling, impaired standing tolerance, difficulty fitting LB clothing and street shoes,     Repetition  Increases Symptoms    Patient Stated Goals  get swelling down and relieve leg pain so I can do more    Currently in Pain?  Yes chronic pain unchanged    Pain Onset  Other (comment) chronic                   OT Treatments/Exercises (OP) - 09/09/17 0001      ADLs   ADL Education Given  Yes      Manual Therapy   Manual Therapy  Edema management;Manual Lymphatic Drainage (MLD);Compression Bandaging    Edema Management  skin care toLLE w/ low ph castor oil during MLD, and with low ph Eucerin lotio0n prior to reapplying wraps.    Manual Lymphatic Drainage (MLD)  MLD to LLE utilizing deep abdominal lymphatics, short neck sequence, functional inguinal LN and  popliteal LN in supine. Pt instructed for diaphragmatic breathing, J stroke and sh9ort neck sequence. Handouts given.     Compression Bandaging  2 layer gradient compressino wrap to LLE             OT Education - 09/09/17 1625    Education Details  Continued skilled Pt/caregiver education  And LE ADL training throughout visit for lymphedema self care/ home program, including kinesio tape wear and care, compression wrapping, compression garment and device wear/care, lymphatic pumping ther ex, simple self-MLD, and skin care. Discussed progress towards goals.     Person(s) Educated  Patient;Parent(s)    Methods  Explanation;Demonstration    Comprehension  Verbalized understanding;Returned demonstration          OT Long Term Goals - 09/03/17 1427      OT LONG TERM GOAL #1   Title  Pt independent w/ lymphedema precautions/prevention principals and using printed reference to limit LE progression and infection risk.    Baseline  Max A    Time  2    Period  Weeks    Status  Achieved      OT LONG TERM GOAL #2   Title  Lymphedema (LE) management/ self-care: Pt able to apply knee  length gradient compression wraps using correct techniques with modified independence ( extra time and assistive device/s PRN)   within 2 weeks to achieve optimal limb volume reduction during Intensive Phase of CDT and optimal self-management over time.    Baseline  max A  09/03/17: Pt unable to apply compression wraps. Mother assists her during visit intervals    Time  2    Period  Weeks    Status  Not Met      OT LONG TERM GOAL #3   Title  Lymphedema (LE) management/ self-care:  Pt to achieve at least 10%  limb volume reductions bilaterally  during Intensive Phase CDT to limit LE progression, to decrease infection risk and to improve functional performance in all occupational domains.     Baseline  max a  09/03/17: To date LLE limb volume reduction actually measures as a 2.01% increase below the knee despite  consistent compression wraps and clinical MLD. Pt and therapist agree on plan to fit compression garments ASAP to maximize mobility.    Time  12    Period  Weeks    Status  Partially Met      OT LONG TERM GOAL #4   Title  Lymphedema (LE) management/ self-care:  Pt to tolerate daily compression garments and/  or HOS devices in keeping w/ prescribed wear regime within 1 week of issue date to restore normal limb shape to reduce tissue density and protein build up leading to progression.    Baseline  max A 09/03/17: Garments on order. Not yet fit    Time  12    Period  Weeks    Status  On-going      OT LONG TERM GOAL #5   Title  Lymphedema (LE) Pain: Pt to report 25% reduction in discomfort/ pain described as LE  "heaviness", "fullness", tightness"" by DC to facilitate improved functional ambulation and mobility and increased independence with basic and instrumental ADLs.     Baseline  max A    Time  12    Period  Weeks    Status  Partially Met      OT LONG TERM GOAL #6   Title  Lymphedema (LE) management/ self-care:  During Management Phase CDT Pt to sustain reduced limb volumes achieved during Intensive Phase CDT during Management Phase CDT within 3% using LE self-care home program components as directed ( simple self MLD, skin care, ther ex and daily/ nightly compression garments/ devices) to limit LE progression and further functional decline.    Baseline  max a    Time  6    Period  Months    Status  On-going            Plan - 09/09/17 1627    Clinical Impression Statement  Provided MLD and compression wraps today and Pt tolerated welll. LLE is mildly more swollen today thanat last visit. Increased swelling is expected without 23/7 compression between visits. Cont as per POC.    Occupational Profile and client history currently impacting functional performance  Pt states episodes of severe depression may impact participation in OT    Occupational performance deficits (Please refer  to evaluation for details):  ADL's;IADL's;Work;Leisure;Social Participation;Other decreased body image    Rehab Potential  Good    OT Frequency  3x / week    OT Duration  12 weeks    OT Treatment/Interventions  Self-care/ADL training;Therapeutic exercise;Manual lymph drainage;Coping strategies training;Compression bandaging;Patient/family education;Other (comment);Energy conservation;Therapist, nutritional;Therapeutic activities;DME and/or AE instruction;Manual Therapy    Plan  Fit with knee length BLE compression garments for daytime use and knee length HOS device to limit fibrosis formation and improve lymphatic function during HOS.    Clinical Decision Making  Several treatment options, min-mod task modification necessary    Consulted and Agree with Plan of Care  Patient       Patient will benefit from skilled therapeutic intervention in order to improve the following deficits and impairments:  Abnormal gait, Decreased knowledge of use of DME, Decreased skin integrity, Increased edema, Impaired flexibility, Pain, Decreased mobility, Impaired sensation, Decreased coping skills, Decreased range of motion, Decreased activity tolerance, Decreased knowledge of precautions, Difficulty walking, Obesity  Visit Diagnosis: Lymphedema, not elsewhere classified    Problem List Patient Active Problem List   Diagnosis Date Noted  . Cutaneous skin tags 08/22/2017  . Eating disorder 05/23/2017  . Abdominal pain, right upper quadrant 05/01/2017  . Gallstones 04/19/2017  . Diabetic polyneuropathy associated with type 2 diabetes mellitus ([place]) 04/07/2017  . Diabetic angiopathy ([place]) 04/07/2017  . Vasomotor symptoms due to menopause 04/07/2017  . Mobility impaired 11/12/2016  . Diarrhea 09/05/2016  . Urinary incontinence 12/02/2015  . Candidal intertrigo 04/06/2015  . Cystocele 04/06/2015  . Constipation 04/06/2015  . Back pain 12/28/2014  . Hypertriglyceridemia  10/14/2014  . Breast mass in  female 07/28/2014  . Fall 07/23/2013  . Great toe pain 05/12/2013  . Medication management 11/20/2012  . Abdominal pain 04/23/2012  . Uric acid kidney stone 11/20/2011  . HYPERHIDROSIS 11/07/2009  . Menopausal symptoms 06/17/2009  . Type 2 diabetes mellitus without complication, without long-term current use of insulin ([place]) 08/23/2008  . VITAMIN D DEFICIENCY 05/07/2008  . VITAMIN B12 DEFICIENCY 06/04/2007  . CHRONIC FATIGUE SYNDROME 11/15/2006  . DIVERTICULITIS, HX OF 11/15/2006  . PCOS (polycystic ovarian syndrome) 10/16/2006  . Hyperlipidemia LDL goal <100 10/16/2006  . Morbid obesity with BMI of 50.0-59.9, adult ([place]) 10/16/2006  . Generalized anxiety disorder 10/16/2006  . PANIC DISORDER 10/16/2006  . BULIMIA 10/16/2006  . Chronic depression 10/16/2006  . CARPAL TUNNEL SYNDROME 10/16/2006  . LYMPHEDEMA 10/16/2006  . Allergic rhinitis 10/16/2006  . Mild intermittent asthma 10/16/2006  . TMJ SYNDROME 10/16/2006  . GERD 10/16/2006  . IRRITABLE BOWEL SYNDROME 10/16/2006  . OSTEOARTHRITIS 10/16/2006  . Myalgia and myositis 10/16/2006  . COUGH, CHRONIC 10/16/2006    Andrey Spearman, MS, OTR/L, [facility] 09/09/17 4:30 PM  [place] MAIN [facility] SERVICES [address] [place], [place], [address] Phone: [phone]   Fax:  [phone]  Name: Mackenzie Bonilla MRN: [id number] Date of Birth: 1965/12/22

## 2017-09-10 ENCOUNTER — Ambulatory Visit: Payer: Medicare Other | Admitting: Occupational Therapy

## 2017-09-10 ENCOUNTER — Encounter: Payer: Self-pay | Admitting: Podiatry

## 2017-09-10 ENCOUNTER — Ambulatory Visit: Payer: Medicare Other | Admitting: Podiatry

## 2017-09-10 DIAGNOSIS — E1142 Type 2 diabetes mellitus with diabetic polyneuropathy: Secondary | ICD-10-CM | POA: Diagnosis not present

## 2017-09-10 DIAGNOSIS — I890 Lymphedema, not elsewhere classified: Secondary | ICD-10-CM

## 2017-09-10 DIAGNOSIS — M79676 Pain in unspecified toe(s): Secondary | ICD-10-CM | POA: Diagnosis not present

## 2017-09-10 DIAGNOSIS — B351 Tinea unguium: Secondary | ICD-10-CM

## 2017-09-11 ENCOUNTER — Telehealth: Payer: Self-pay | Admitting: *Deleted

## 2017-09-11 NOTE — Telephone Encounter (Signed)
Insurance ran out and not able to come anymore.  I recommended [employer], A guide to Eating Eat What you love, love what you eat with diabetes [employer] podcast  Recommended not dieting, not restricting, and not limiting

## 2017-09-12 ENCOUNTER — Ambulatory Visit: Payer: Medicare Other | Attending: Podiatry | Admitting: Occupational Therapy

## 2017-09-12 DIAGNOSIS — I890 Lymphedema, not elsewhere classified: Secondary | ICD-10-CM | POA: Insufficient documentation

## 2017-09-12 NOTE — Therapy (Signed)
[place] MAIN [facility] SERVICES [address] [place], [place], [address] Phone: [phone]   Fax:  [phone]  Occupational Therapy Treatment  Patient Details  Name: Mackenzie Bonilla MRN: [id number] Date of Birth: Jul 02, 1965 No data recorded  Encounter Date: 09/12/2017  OT End of Session - 09/12/17 1300    Visit Number  12    Number of Visits  36    Date for OT Re-Evaluation  10/22/17    OT Start Time  0100    OT Stop Time  0205    OT Time Calculation (min)  65 min    Activity Tolerance  Patient tolerated treatment well;No increased pain    Behavior During Therapy  WFL for tasks assessed/performed       Past Medical History:  Diagnosis Date  . Allergy    allergic rhinitis  . Anemia   . Anxiety   . Arthritis    osteoarthritis  . Asthma   . Claustrophobia    does not like oxygen mask covering face  . Depression   . Diabetes mellitus without complication ([place])   . Fall 2016  . Fatty liver 07/2008   abd. ultrasound - fatty liver ; slt dilated cbd (no stones ) 06/10// abd.  ultrasound normal on 04/2006  . Fibromyalgia   . GERD (gastroesophageal reflux disease)    EGD negative 06/2001// EGD erythematous mucosa, polyp 08/2008  . High uric acid in 24 hour urine specimen   . Hyperhidrosis   . Hyperlipidemia   . Kidney stones   . Lymphedema   . Neuromuscular disorder (HCC)    fibromyalgia  . Obesity   . Shortness of breath dyspnea   . Tachycardia   . TMJ (dislocation of temporomandibular joint)   . UTI (lower urinary tract infection)     Past Surgical History:  Procedure Laterality Date  . BREAST BIOPSY Right 2016   neg  . BREAST LUMPECTOMY WITH RADIOACTIVE SEED LOCALIZATION Right 08/02/2014   Procedure: BREAST LUMPECTOMY WITH RADIOACTIVE SEED LOCALIZATION;  Surgeon: Rolm Bookbinder, MD;  Location: [facility];  Service: General;  Laterality: Right;  . COLONOSCOPY  08/12/2012   Completed by Dr. Phylis Bougie, normal exam.   .  ENDOMETRIAL BIOPSY  01/2004  . ESOPHAGOGASTRODUODENOSCOPY    . FOOT SURGERY    . SEPTOPLASTY     two times  . TONSILLECTOMY    . UTERINE FIBROID SURGERY  06/2005   ablation  . uterine tumor  09/2001  . VAGUS NERVE STIMULATOR INSERTION      There were no vitals filed for this visit.  Subjective Assessment - 09/12/17 1312    Subjective   Mackenzie Bonilla is here for OT visit 13/36 for Intensive Phase CDT to BLE, with inital emphasis on LLE. Pt called DME vendor to check status of custom compression garments. DME vendor reports garments have been shipped.    Pertinent History  Primary LE dx at age 19 is consistent with LE Praecox, Lymphedema praecox, also known as Meige disease, is the most common form of primary lymphedema. By definition, this disease becomes clinically evident after birth and before age 66 years. L ankle fx 2017, obesity, depression, OA- B knees, chromnic back pain, type II diabetes     Limitations  chronic leg pain and swelling, impaired standing tolerance, difficulty fitting LB clothing and street shoes,     Repetition  Increases Symptoms    Patient Stated Goals  get swelling down and relieve leg pain so  I can do more    Currently in Pain?  Yes chronic pain unchanged    Pain Onset  Other (comment) chronic                   OT Treatments/Exercises (OP) - 09/12/17 0001      ADLs   ADL Education Given  Yes      Manual Therapy   Manual Therapy  Edema management;Manual Lymphatic Drainage (MLD);Compression Bandaging    Edema Management  skin care toLLE w/ low ph castor oil during MLD, and with low ph Eucerin lotio0n prior to reapplying wraps.    Manual Lymphatic Drainage (MLD)  MLD to LLE utilizing deep abdominal lymphatics, short neck sequence, functional inguinal LN and popliteal LN in supine. Pt instructed for diaphragmatic breathing, J stroke and sh9ort neck sequence. Handouts given.     Compression Bandaging  3 layer gradient compressino wrap to LLE              OT Education - 09/12/17 1314    Education Details  Continued skilled Pt/caregiver education  And LE ADL training throughout visit for lymphedema self care/ home program, including kinesio tape wear and care, compression wrapping, compression garment and device wear/care, lymphatic pumping ther ex, simple self-MLD, and skin care. Discussed progress towards goals.     Person(s) Educated  Patient    Methods  Explanation;Demonstration    Comprehension  Verbalized understanding;Returned demonstration          OT Long Term Goals - 09/03/17 1427      OT LONG TERM GOAL #1   Title  Pt independent w/ lymphedema precautions/prevention principals and using printed reference to limit LE progression and infection risk.    Baseline  Max A    Time  2    Period  Weeks    Status  Achieved      OT LONG TERM GOAL #2   Title  Lymphedema (LE) management/ self-care: Pt able to apply knee  length gradient compression wraps using correct techniques with modified independence ( extra time and assistive device/s PRN)   within 2 weeks to achieve optimal limb volume reduction during Intensive Phase of CDT and optimal self-management over time.    Baseline  max A  09/03/17: Pt unable to apply compression wraps. Mother assists her during visit intervals    Time  2    Period  Weeks    Status  Not Met      OT LONG TERM GOAL #3   Title  Lymphedema (LE) management/ self-care:  Pt to achieve at least 10%  limb volume reductions bilaterally  during Intensive Phase CDT to limit LE progression, to decrease infection risk and to improve functional performance in all occupational domains.     Baseline  max a  09/03/17: To date LLE limb volume reduction actually measures as a 2.01% increase below the knee despite consistent compression wraps and clinical MLD. Pt and therapist agree on plan to fit compression garments ASAP to maximize mobility.    Time  12    Period  Weeks    Status  Partially Met      OT  LONG TERM GOAL #4   Title  Lymphedema (LE) management/ self-care:  Pt to tolerate daily compression garments and/ or HOS devices in keeping w/ prescribed wear regime within 1 week of issue date to restore normal limb shape to reduce tissue density and protein build up leading to progression.    Baseline  max A 09/03/17: Garments on order. Not yet fit    Time  12    Period  Weeks    Status  On-going      OT LONG TERM GOAL #5   Title  Lymphedema (LE) Pain: Pt to report 25% reduction in discomfort/ pain described as LE  "heaviness", "fullness", tightness"" by DC to facilitate improved functional ambulation and mobility and increased independence with basic and instrumental ADLs.     Baseline  max A    Time  12    Period  Weeks    Status  Partially Met      OT LONG TERM GOAL #6   Title  Lymphedema (LE) management/ self-care:  During Management Phase CDT Pt to sustain reduced limb volumes achieved during Intensive Phase CDT during Management Phase CDT within 3% using LE self-care home program components as directed ( simple self MLD, skin care, ther ex and daily/ nightly compression garments/ devices) to limit LE progression and further functional decline.    Baseline  max a    Time  6    Period  Months    Status  On-going            Plan - 09/12/17 1711    Clinical Impression Statement  Provioded MLD, skin care and PT edu for LE self care today. Pt tolerated all aspects of OT visit without difficulty. Pt called  DME vendor during session to check status of her   garments, and vendor confirmed first pair has been ordered. Cont as per POC. Fit LLE compression ASAP.    Occupational Profile and client history currently impacting functional performance  Pt states episodes of severe depression may impact participation in OT    Occupational performance deficits (Please refer to evaluation for details):  ADL's;IADL's;Work;Leisure;Social Participation;Other decreased body image    Rehab Potential   Good    OT Frequency  3x / week    OT Duration  12 weeks    OT Treatment/Interventions  Self-care/ADL training;Therapeutic exercise;Manual lymph drainage;Coping strategies training;Compression bandaging;Patient/family education;Other (comment);Energy conservation;Therapist, nutritional;Therapeutic activities;DME and/or AE instruction;Manual Therapy    Plan  Fit with knee length BLE compression garments for daytime use and knee length HOS device to limit fibrosis formation and improve lymphatic function during HOS.    Clinical Decision Making  Several treatment options, min-mod task modification necessary    Consulted and Agree with Plan of Care  Patient       Patient will benefit from skilled therapeutic intervention in order to improve the following deficits and impairments:  Abnormal gait, Decreased knowledge of use of DME, Decreased skin integrity, Increased edema, Impaired flexibility, Pain, Decreased mobility, Impaired sensation, Decreased coping skills, Decreased range of motion, Decreased activity tolerance, Decreased knowledge of precautions, Difficulty walking, Obesity  Visit Diagnosis: Lymphedema, not elsewhere classified    Problem List Patient Active Problem List   Diagnosis Date Noted  . Cutaneous skin tags 08/22/2017  . Eating disorder 05/23/2017  . Abdominal pain, right upper quadrant 05/01/2017  . Gallstones 04/19/2017  . Diabetic polyneuropathy associated with type 2 diabetes mellitus ([place]) 04/07/2017  . Diabetic angiopathy ([place]) 04/07/2017  . Vasomotor symptoms due to menopause 04/07/2017  . Mobility impaired 11/12/2016  . Diarrhea 09/05/2016  . Urinary incontinence 12/02/2015  . Candidal intertrigo 04/06/2015  . Cystocele 04/06/2015  . Constipation 04/06/2015  . Back pain 12/28/2014  . Hypertriglyceridemia 10/14/2014  . Breast mass in female 07/28/2014  . Fall 07/23/2013  . Great toe  pain 05/12/2013  . Medication management 11/20/2012  . Abdominal pain  04/23/2012  . Uric acid kidney stone 11/20/2011  . HYPERHIDROSIS 11/07/2009  . Menopausal symptoms 06/17/2009  . Type 2 diabetes mellitus without complication, without long-term current use of insulin ([place]) 08/23/2008  . VITAMIN D DEFICIENCY 05/07/2008  . VITAMIN B12 DEFICIENCY 06/04/2007  . CHRONIC FATIGUE SYNDROME 11/15/2006  . DIVERTICULITIS, HX OF 11/15/2006  . PCOS (polycystic ovarian syndrome) 10/16/2006  . Hyperlipidemia LDL goal <100 10/16/2006  . Morbid obesity with BMI of 50.0-59.9, adult ([place]) 10/16/2006  . Generalized anxiety disorder 10/16/2006  . PANIC DISORDER 10/16/2006  . BULIMIA 10/16/2006  . Chronic depression 10/16/2006  . CARPAL TUNNEL SYNDROME 10/16/2006  . LYMPHEDEMA 10/16/2006  . Allergic rhinitis 10/16/2006  . Mild intermittent asthma 10/16/2006  . TMJ SYNDROME 10/16/2006  . GERD 10/16/2006  . IRRITABLE BOWEL SYNDROME 10/16/2006  . OSTEOARTHRITIS 10/16/2006  . Myalgia and myositis 10/16/2006  . COUGH, CHRONIC 10/16/2006    Andrey Spearman, MS, OTR/L, [facility] 09/12/17 5:14 PM   [place] MAIN [facility] SERVICES [address] [place], [place], [address] Phone: [phone]   Fax:  [phone]  Name: Mackenzie Bonilla MRN: [id number] Date of Birth: Apr 26, 1965

## 2017-09-15 NOTE — Progress Notes (Signed)
   SUBJECTIVE Patient with a history of diabetes mellitus presents to office today complaining of elongated, thickened nails that cause pain while ambulating in shoes. She is unable to trim her own nails. Patient is here for further evaluation and treatment.   Past Medical History:  Diagnosis Date  . Allergy    allergic rhinitis  . Anemia   . Anxiety   . Arthritis    osteoarthritis  . Asthma   . Claustrophobia    does not like oxygen mask covering face  . Depression   . Diabetes mellitus without complication ([place])   . Fall 2016  . Fatty liver 07/2008   abd. ultrasound - fatty liver ; slt dilated cbd (no stones ) 06/10// abd.  ultrasound normal on 04/2006  . Fibromyalgia   . GERD (gastroesophageal reflux disease)    EGD negative 06/2001// EGD erythematous mucosa, polyp 08/2008  . High uric acid in 24 hour urine specimen   . Hyperhidrosis   . Hyperlipidemia   . Kidney stones   . Lymphedema   . Neuromuscular disorder (HCC)    fibromyalgia  . Obesity   . Shortness of breath dyspnea   . Tachycardia   . TMJ (dislocation of temporomandibular joint)   . UTI (lower urinary tract infection)     OBJECTIVE General Patient is awake, alert, and oriented x 3 and in no acute distress. Derm Skin is dry and supple bilateral. Negative open lesions or macerations. Remaining integument unremarkable. Nails are tender, long, thickened and dystrophic with subungual debris, consistent with onychomycosis, 1-5 bilateral. No signs of infection noted. Vasc  DP and PT pedal pulses palpable bilaterally. Temperature gradient within normal limits. Edema noted to bilateral lower extremities.  Neuro Epicritic and protective threshold sensation diminished bilaterally.  Musculoskeletal Exam No symptomatic pedal deformities noted bilateral. Muscular strength within normal limits.  ASSESSMENT 1. Diabetes Mellitus w/ peripheral neuropathy 2. Onychomycosis of nail due to dermatophyte bilateral 3. Pain in foot  bilateral 4. Chronic BLE edema   PLAN OF CARE 1. Patient evaluated today. 2. Instructed to maintain good pedal hygiene and foot care. Stressed importance of controlling blood sugar.  3. Mechanical debridement of nails 1-5 bilaterally performed using a nail nipper. Filed with dremel without incident.  4. Continue care with lymphedema specialist.  5. Return to clinic in 3 mos.   Goes by [employer].  Edrick Kins, DPM [facility]  Dr. Edrick Kins, [place]                                        [place], [place] [address]                Office [phone]  Fax [phone]

## 2017-09-16 ENCOUNTER — Ambulatory Visit: Payer: Medicare Other | Admitting: Occupational Therapy

## 2017-09-16 DIAGNOSIS — I890 Lymphedema, not elsewhere classified: Secondary | ICD-10-CM

## 2017-09-16 NOTE — Therapy (Signed)
[place] MAIN [facility] SERVICES [address] [place], [place], [address] Phone: [phone]   Fax:  [phone]  Occupational Therapy Treatment  Patient Details  Name: Mackenzie Bonilla MRN: [id number] Date of Birth: [birth year] No data recorded  Encounter Date: 09/16/2017  OT End of Session - 09/16/17 1526    Visit Number  13    Number of Visits  36    Date for OT Re-Evaluation  10/22/17    OT Start Time  0106    OT Stop Time  0206    OT Time Calculation (min)  60 min    Activity Tolerance  Patient tolerated treatment well;No increased pain    Behavior During Therapy  WFL for tasks assessed/performed       Past Medical History:  Diagnosis Date  . Allergy    allergic rhinitis  . Anemia   . Anxiety   . Arthritis    osteoarthritis  . Asthma   . Claustrophobia    does not like oxygen mask covering face  . Depression   . Diabetes mellitus without complication ([place])   . Fall 2016  . Fatty liver 07/2008   abd. ultrasound - fatty liver ; slt dilated cbd (no stones ) 06/10// abd.  ultrasound normal on 04/2006  . Fibromyalgia   . GERD (gastroesophageal reflux disease)    EGD negative 06/2001// EGD erythematous mucosa, polyp 08/2008  . High uric acid in 24 hour urine specimen   . Hyperhidrosis   . Hyperlipidemia   . Kidney stones   . Lymphedema   . Neuromuscular disorder (HCC)    fibromyalgia  . Obesity   . Shortness of breath dyspnea   . Tachycardia   . TMJ (dislocation of temporomandibular joint)   . UTI (lower urinary tract infection)     Past Surgical History:  Procedure Laterality Date  . BREAST BIOPSY Right 2016   neg  . BREAST LUMPECTOMY WITH RADIOACTIVE SEED LOCALIZATION Right 08/02/2014   Procedure: BREAST LUMPECTOMY WITH RADIOACTIVE SEED LOCALIZATION;  Surgeon: Rolm Bookbinder, MD;  Location: [place];  Service: General;  Laterality: Right;  . COLONOSCOPY  08/12/2012   Completed by Dr. Phylis Bougie, normal exam.   .  ENDOMETRIAL BIOPSY  01/2004  . ESOPHAGOGASTRODUODENOSCOPY    . FOOT SURGERY    . SEPTOPLASTY     two times  . TONSILLECTOMY    . UTERINE FIBROID SURGERY  06/2005   ablation  . uterine tumor  09/2001  . VAGUS NERVE STIMULATOR INSERTION      There were no vitals filed for this visit.  Subjective Assessment - 09/16/17 1525    Subjective   Mackenzie Bonilla is here for OT visit 14/36 for Intensive Phase CDT to BLE, with inital emphasis on LLE. Pt reports her mother came over and wrapped her LLE over the weekend, but soon after she left the wrap started huring and came lose.    Pertinent History  Primary LE dx at age 24 is consistent with LE Praecox, Lymphedema praecox, also known as Meige disease, is the most common form of primary lymphedema. By definition, this disease becomes clinically evident after birth and before age 25 years. L ankle fx 2017, obesity, depression, OA- B knees, chromnic back pain, type II diabetes     Limitations  chronic leg pain and swelling, impaired standing tolerance, difficulty fitting LB clothing and street shoes,     Repetition  Increases Symptoms    Patient Stated Goals  get  swelling down and relieve leg pain so I can do more    Currently in Pain?  Yes chronic pain unchanged. Not rated numerically    Pain Onset  Other (comment) chronic                   OT Treatments/Exercises (OP) - 09/16/17 0001      ADLs   ADL Education Given  Yes      Manual Therapy   Manual Therapy  Edema management;Manual Lymphatic Drainage (MLD);Compression Bandaging    Edema Management  skin care toLLE w/ low ph castor oil during MLD, and with low ph Eucerin lotio0n prior to reapplying wraps.    Manual Lymphatic Drainage (MLD)  MLD to LLE utilizing deep abdominal lymphatics, short neck sequence, functional inguinal LN and popliteal LN in supine. Pt instructed for diaphragmatic breathing, J stroke and sh9ort neck sequence. Handouts given.     Compression Bandaging  3 layer  gradient compressino wrap to LLE             OT Education - 09/16/17 1527    Education Details  Continued skilled Pt/caregiver education  And LE ADL training throughout visit for lymphedema self care/ home program, including kinesio tape wear and care, compression wrapping, compression garment and device wear/care, lymphatic pumping ther ex, simple self-MLD, and skin care. Discussed progress towards goals.     Person(s) Educated  Patient    Methods  Explanation;Demonstration    Comprehension  Verbalized understanding;Returned demonstration          OT Long Term Goals - 09/03/17 1427      OT LONG TERM GOAL #1   Title  Pt independent w/ lymphedema precautions/prevention principals and using printed reference to limit LE progression and infection risk.    Baseline  Max A    Time  2    Period  Weeks    Status  Achieved      OT LONG TERM GOAL #2   Title  Lymphedema (LE) management/ self-care: Pt able to apply knee  length gradient compression wraps using correct techniques with modified independence ( extra time and assistive device/s PRN)   within 2 weeks to achieve optimal limb volume reduction during Intensive Phase of CDT and optimal self-management over time.    Baseline  max A  09/03/17: Pt unable to apply compression wraps. Mother assists her during visit intervals    Time  2    Period  Weeks    Status  Not Met      OT LONG TERM GOAL #3   Title  Lymphedema (LE) management/ self-care:  Pt to achieve at least 10%  limb volume reductions bilaterally  during Intensive Phase CDT to limit LE progression, to decrease infection risk and to improve functional performance in all occupational domains.     Baseline  max a  09/03/17: To date LLE limb volume reduction actually measures as a 2.01% increase below the knee despite consistent compression wraps and clinical MLD. Pt and therapist agree on plan to fit compression garments ASAP to maximize mobility.    Time  12    Period  Weeks     Status  Partially Met      OT LONG TERM GOAL #4   Title  Lymphedema (LE) management/ self-care:  Pt to tolerate daily compression garments and/ or HOS devices in keeping w/ prescribed wear regime within 1 week of issue date to restore normal limb shape to reduce tissue density and  protein build up leading to progression.    Baseline  max A 09/03/17: Garments on order. Not yet fit    Time  12    Period  Weeks    Status  On-going      OT LONG TERM GOAL #5   Title  Lymphedema (LE) Pain: Pt to report 25% reduction in discomfort/ pain described as LE  "heaviness", "fullness", tightness"" by DC to facilitate improved functional ambulation and mobility and increased independence with basic and instrumental ADLs.     Baseline  max A    Time  12    Period  Weeks    Status  Partially Met      OT LONG TERM GOAL #6   Title  Lymphedema (LE) management/ self-care:  During Management Phase CDT Pt to sustain reduced limb volumes achieved during Intensive Phase CDT during Management Phase CDT within 3% using LE self-care home program components as directed ( simple self MLD, skin care, ther ex and daily/ nightly compression garments/ devices) to limit LE progression and further functional decline.    Baseline  max a    Time  6    Period  Months    Status  On-going            Plan - 09/16/17 1527    Clinical Impression Statement  LLE presents with moderate increase in limb swelling and increased tissue density and firmness. Clinical gains at this point are minimal due to limited and inconsistent assistance with compression wraps between visits. Pt tolerated MLD, skin care, compression wrap, and self-cre edu without difficulty. Garments have been ordered. Fit ASAP and reduce Rx frequency to follow along status.    Occupational Profile and client history currently impacting functional performance  Pt states episodes of severe depression may impact participation in OT    Occupational performance  deficits (Please refer to evaluation for details):  ADL's;IADL's;Work;Leisure;Social Participation;Other decreased body image    Rehab Potential  Good    OT Frequency  3x / week    OT Duration  12 weeks    OT Treatment/Interventions  Self-care/ADL training;Therapeutic exercise;Manual lymph drainage;Coping strategies training;Compression bandaging;Patient/family education;Other (comment);Energy conservation;Therapist, nutritional;Therapeutic activities;DME and/or AE instruction;Manual Therapy    Plan  Fit with knee length BLE compression garments for daytime use and knee length HOS device to limit fibrosis formation and improve lymphatic function during HOS.    Clinical Decision Making  Several treatment options, min-mod task modification necessary    Consulted and Agree with Plan of Care  Patient       Patient will benefit from skilled therapeutic intervention in order to improve the following deficits and impairments:  Abnormal gait, Decreased knowledge of use of DME, Decreased skin integrity, Increased edema, Impaired flexibility, Pain, Decreased mobility, Impaired sensation, Decreased coping skills, Decreased range of motion, Decreased activity tolerance, Decreased knowledge of precautions, Difficulty walking, Obesity  Visit Diagnosis: Lymphedema, not elsewhere classified    Problem List Patient Active Problem List   Diagnosis Date Noted  . Cutaneous skin tags 08/22/2017  . Eating disorder 05/23/2017  . Abdominal pain, right upper quadrant 05/01/2017  . Gallstones 04/19/2017  . Diabetic polyneuropathy associated with type 2 diabetes mellitus ([place]) 04/07/2017  . Diabetic angiopathy ([place]) 04/07/2017  . Vasomotor symptoms due to menopause 04/07/2017  . Mobility impaired 11/12/2016  . Diarrhea 09/05/2016  . Urinary incontinence 12/02/2015  . Candidal intertrigo 04/06/2015  . Cystocele 04/06/2015  . Constipation 04/06/2015  . Back pain 12/28/2014  .  Hypertriglyceridemia  10/14/2014  . Breast mass in female 07/28/2014  . Fall 07/23/2013  . Great toe pain 05/12/2013  . Medication management 11/20/2012  . Abdominal pain 04/23/2012  . Uric acid kidney stone 11/20/2011  . HYPERHIDROSIS 11/07/2009  . Menopausal symptoms 06/17/2009  . Type 2 diabetes mellitus without complication, without long-term current use of insulin ([place]) 08/23/2008  . VITAMIN D DEFICIENCY 05/07/2008  . VITAMIN B12 DEFICIENCY 06/04/2007  . CHRONIC FATIGUE SYNDROME 11/15/2006  . DIVERTICULITIS, HX OF 11/15/2006  . PCOS (polycystic ovarian syndrome) 10/16/2006  . Hyperlipidemia LDL goal <100 10/16/2006  . Morbid obesity with BMI of 50.0-59.9, adult ([place]) 10/16/2006  . Generalized anxiety disorder 10/16/2006  . PANIC DISORDER 10/16/2006  . BULIMIA 10/16/2006  . Chronic depression 10/16/2006  . CARPAL TUNNEL SYNDROME 10/16/2006  . LYMPHEDEMA 10/16/2006  . Allergic rhinitis 10/16/2006  . Mild intermittent asthma 10/16/2006  . TMJ SYNDROME 10/16/2006  . GERD 10/16/2006  . IRRITABLE BOWEL SYNDROME 10/16/2006  . OSTEOARTHRITIS 10/16/2006  . Myalgia and myositis 10/16/2006  . COUGH, CHRONIC 10/16/2006   Andrey Spearman, MS, OTR/L, [facility] 09/16/17 3:31 PM  [place] MAIN [facility] SERVICES [address] [place], [place], [address] Phone: [phone]   Fax:  [phone]  Name: Mackenzie Bonilla MRN: [id number] Date of Birth: 07-08-1965

## 2017-09-17 ENCOUNTER — Encounter: Payer: Self-pay | Admitting: Occupational Therapy

## 2017-09-18 ENCOUNTER — Ambulatory Visit: Payer: Self-pay | Admitting: *Deleted

## 2017-09-19 ENCOUNTER — Ambulatory Visit: Payer: Medicare Other | Admitting: Occupational Therapy

## 2017-09-19 DIAGNOSIS — I890 Lymphedema, not elsewhere classified: Secondary | ICD-10-CM | POA: Diagnosis not present

## 2017-09-19 NOTE — Therapy (Signed)
[place] MAIN [facility] SERVICES [address] [place], [place], [address] Phone: [phone]   Fax:  [phone]  Occupational Therapy Treatment  Patient Details  Name: Mackenzie Bonilla MRN: [id number] Date of Birth: 1965-11-18 No data recorded  Encounter Date: 09/19/2017  OT End of Session - 09/19/17 1449    Visit Number  14    Number of Visits  36    Date for OT Re-Evaluation  10/22/17    OT Start Time  0100    OT Stop Time  0149    OT Time Calculation (min)  49 min    Activity Tolerance  Patient tolerated treatment well;No increased pain    Behavior During Therapy  WFL for tasks assessed/performed       Past Medical History:  Diagnosis Date  . Allergy    allergic rhinitis  . Anemia   . Anxiety   . Arthritis    osteoarthritis  . Asthma   . Claustrophobia    does not like oxygen mask covering face  . Depression   . Diabetes mellitus without complication ([place])   . Fall 2016  . Fatty liver 07/2008   abd. ultrasound - fatty liver ; slt dilated cbd (no stones ) 06/10// abd.  ultrasound normal on 04/2006  . Fibromyalgia   . GERD (gastroesophageal reflux disease)    EGD negative 06/2001// EGD erythematous mucosa, polyp 08/2008  . High uric acid in 24 hour urine specimen   . Hyperhidrosis   . Hyperlipidemia   . Kidney stones   . Lymphedema   . Neuromuscular disorder (HCC)    fibromyalgia  . Obesity   . Shortness of breath dyspnea   . Tachycardia   . TMJ (dislocation of temporomandibular joint)   . UTI (lower urinary tract infection)     Past Surgical History:  Procedure Laterality Date  . BREAST BIOPSY Right 2016   neg  . BREAST LUMPECTOMY WITH RADIOACTIVE SEED LOCALIZATION Right 08/02/2014   Procedure: BREAST LUMPECTOMY WITH RADIOACTIVE SEED LOCALIZATION;  Surgeon: Rolm Bookbinder, MD;  Location: [place];  Service: General;  Laterality: Right;  . COLONOSCOPY  08/12/2012   Completed by Dr. Phylis Bougie, normal exam.   .  ENDOMETRIAL BIOPSY  01/2004  . ESOPHAGOGASTRODUODENOSCOPY    . FOOT SURGERY    . SEPTOPLASTY     two times  . TONSILLECTOMY    . UTERINE FIBROID SURGERY  06/2005   ablation  . uterine tumor  09/2001  . VAGUS NERVE STIMULATOR INSERTION      There were no vitals filed for this visit.  Subjective Assessment - 09/19/17 1447    Subjective   Mackenzie Bonilla is here for OT visit 15/36 for Intensive Phase CDT to BLE, with inital emphasis on LLE. Pt reports DME vendor notified her that her compression garments will ship next week.    Pertinent History  Primary LE dx at age 76 is consistent with LE Praecox, Lymphedema praecox, also known as Meige disease, is the most common form of primary lymphedema. By definition, this disease becomes clinically evident after birth and before age 38 years. L ankle fx 2017, obesity, depression, OA- B knees, chromnic back pain, type II diabetes     Limitations  chronic leg pain and swelling, impaired standing tolerance, difficulty fitting LB clothing and street shoes,     Repetition  Increases Symptoms    Patient Stated Goals  get swelling down and relieve leg pain so I can do more  Currently in Pain?  Yes   chronic pain unchanged.   Pain Onset  Other (comment)   chronic                  OT Treatments/Exercises (OP) - 09/19/17 0001      ADLs   ADL Education Given  Yes      Manual Therapy   Manual Therapy  Edema management;Manual Lymphatic Drainage (MLD);Compression Bandaging    Edema Management  skin care toLLE w/ low ph castor oil during MLD, and with low ph Eucerin lotio0n prior to reapplying wraps.    Manual Lymphatic Drainage (MLD)  MLD to LLE utilizing deep abdominal lymphatics, short neck sequence, functional inguinal LN and popliteal LN in supine. Pt instructed for diaphragmatic breathing, J stroke and sh9ort neck sequence. Handouts given.     Compression Bandaging  3 layer gradient compressino wrap to LLE             OT  Education - 09/19/17 1449    Education Details  Continued skilled Pt/caregiver education  And LE ADL training throughout visit for lymphedema self care/ home program, including kinesio tape wear and care, compression wrapping, compression garment and device wear/care, lymphatic pumping ther ex, simple self-MLD, and skin care. Discussed progress towards goals.     Person(s) Educated  Patient    Methods  Explanation;Demonstration    Comprehension  Verbalized understanding;Returned demonstration          OT Long Term Goals - 09/03/17 1427      OT LONG TERM GOAL #1   Title  Pt independent w/ lymphedema precautions/prevention principals and using printed reference to limit LE progression and infection risk.    Baseline  Max A    Time  2    Period  Weeks    Status  Achieved      OT LONG TERM GOAL #2   Title  Lymphedema (LE) management/ self-care: Pt able to apply knee  length gradient compression wraps using correct techniques with modified independence ( extra time and assistive device/s PRN)   within 2 weeks to achieve optimal limb volume reduction during Intensive Phase of CDT and optimal self-management over time.    Baseline  max A  09/03/17: Pt unable to apply compression wraps. Mother assists her during visit intervals    Time  2    Period  Weeks    Status  Not Met      OT LONG TERM GOAL #3   Title  Lymphedema (LE) management/ self-care:  Pt to achieve at least 10%  limb volume reductions bilaterally  during Intensive Phase CDT to limit LE progression, to decrease infection risk and to improve functional performance in all occupational domains.     Baseline  max a  09/03/17: To date LLE limb volume reduction actually measures as a 2.01% increase below the knee despite consistent compression wraps and clinical MLD. Pt and therapist agree on plan to fit compression garments ASAP to maximize mobility.    Time  12    Period  Weeks    Status  Partially Met      OT LONG TERM GOAL #4    Title  Lymphedema (LE) management/ self-care:  Pt to tolerate daily compression garments and/ or HOS devices in keeping w/ prescribed wear regime within 1 week of issue date to restore normal limb shape to reduce tissue density and protein build up leading to progression.    Baseline  max A 09/03/17: Garments  on order. Not yet fit    Time  12    Period  Weeks    Status  On-going      OT LONG TERM GOAL #5   Title  Lymphedema (LE) Pain: Pt to report 25% reduction in discomfort/ pain described as LE  "heaviness", "fullness", tightness"" by DC to facilitate improved functional ambulation and mobility and increased independence with basic and instrumental ADLs.     Baseline  max A    Time  12    Period  Weeks    Status  Partially Met      OT LONG TERM GOAL #6   Title  Lymphedema (LE) management/ self-care:  During Management Phase CDT Pt to sustain reduced limb volumes achieved during Intensive Phase CDT during Management Phase CDT within 3% using LE self-care home program components as directed ( simple self MLD, skin care, ther ex and daily/ nightly compression garments/ devices) to limit LE progression and further functional decline.    Baseline  max a    Time  6    Period  Months    Status  On-going            Plan - 09/19/17 1449    Clinical Impression Statement  Provided MLD, skin care , and compression wraps      today. Pt tolerated all aspects of care, including ongoing  LE self-care edu, without difficulty. Pt requested apt abbreviated by 10 minutes today. Cont as per POC. Fit garments ASAP and decrease OT frequency PRN..    Occupational Profile and client history currently impacting functional performance  Pt states episodes of severe depression may impact participation in OT    Occupational performance deficits (Please refer to evaluation for details):  ADL's;IADL's;Work;Leisure;Social Participation;Other   decreased body image   Rehab Potential  Good    OT Frequency  3x / week     OT Duration  12 weeks    OT Treatment/Interventions  Self-care/ADL training;Therapeutic exercise;Manual lymph drainage;Coping strategies training;Compression bandaging;Patient/family education;Other (comment);Energy conservation;Therapist, nutritional;Therapeutic activities;DME and/or AE instruction;Manual Therapy    Plan  Fit with knee length BLE compression garments for daytime use and knee length HOS device to limit fibrosis formation and improve lymphatic function during HOS.    Clinical Decision Making  Several treatment options, min-mod task modification necessary    Consulted and Agree with Plan of Care  Patient       Patient will benefit from skilled therapeutic intervention in order to improve the following deficits and impairments:  Abnormal gait, Decreased knowledge of use of DME, Decreased skin integrity, Increased edema, Impaired flexibility, Pain, Decreased mobility, Impaired sensation, Decreased coping skills, Decreased range of motion, Decreased activity tolerance, Decreased knowledge of precautions, Difficulty walking, Obesity  Visit Diagnosis: Lymphedema, not elsewhere classified    Problem List Patient Active Problem List   Diagnosis Date Noted  . Cutaneous skin tags 08/22/2017  . Eating disorder 05/23/2017  . Abdominal pain, right upper quadrant 05/01/2017  . Gallstones 04/19/2017  . Diabetic polyneuropathy associated with type 2 diabetes mellitus ([place]) 04/07/2017  . Diabetic angiopathy ([place]) 04/07/2017  . Vasomotor symptoms due to menopause 04/07/2017  . Mobility impaired 11/12/2016  . Diarrhea 09/05/2016  . Urinary incontinence 12/02/2015  . Candidal intertrigo 04/06/2015  . Cystocele 04/06/2015  . Constipation 04/06/2015  . Back pain 12/28/2014  . Hypertriglyceridemia 10/14/2014  . Breast mass in female 07/28/2014  . Fall 07/23/2013  . Great toe pain 05/12/2013  . Medication management 11/20/2012  .  Abdominal pain 04/23/2012  . Uric acid kidney  stone 11/20/2011  . HYPERHIDROSIS 11/07/2009  . Menopausal symptoms 06/17/2009  . Type 2 diabetes mellitus without complication, without long-term current use of insulin ([place]) 08/23/2008  . VITAMIN D DEFICIENCY 05/07/2008  . VITAMIN B12 DEFICIENCY 06/04/2007  . CHRONIC FATIGUE SYNDROME 11/15/2006  . DIVERTICULITIS, HX OF 11/15/2006  . PCOS (polycystic ovarian syndrome) 10/16/2006  . Hyperlipidemia LDL goal <100 10/16/2006  . Morbid obesity with BMI of 50.0-59.9, adult ([place]) 10/16/2006  . Generalized anxiety disorder 10/16/2006  . PANIC DISORDER 10/16/2006  . BULIMIA 10/16/2006  . Chronic depression 10/16/2006  . CARPAL TUNNEL SYNDROME 10/16/2006  . LYMPHEDEMA 10/16/2006  . Allergic rhinitis 10/16/2006  . Mild intermittent asthma 10/16/2006  . TMJ SYNDROME 10/16/2006  . GERD 10/16/2006  . IRRITABLE BOWEL SYNDROME 10/16/2006  . OSTEOARTHRITIS 10/16/2006  . Myalgia and myositis 10/16/2006  . COUGH, CHRONIC 10/16/2006    Andrey Spearman, MS, OTR/L, [facility] 09/19/17 2:52 PM  [place] MAIN [facility] SERVICES [address] [place], [place], [address] Phone: [phone]   Fax:  [phone]  Name: Mackenzie Bonilla MRN: [id number] Date of Birth: April 03, 1965

## 2017-09-23 ENCOUNTER — Ambulatory Visit: Payer: Medicare Other | Admitting: Occupational Therapy

## 2017-09-23 DIAGNOSIS — I890 Lymphedema, not elsewhere classified: Secondary | ICD-10-CM

## 2017-09-23 NOTE — Therapy (Signed)
[place] MAIN [facility] SERVICES [address] [place], [place], [address] Phone: [phone]   Fax:  [phone]  Occupational Therapy Treatment  Patient Details  Name: MARKEETA SCALF MRN: [id number] Date of Birth: [birth year] No data recorded  Encounter Date: 09/23/2017  OT End of Session - 09/23/17 1649    Visit Number  15    Number of Visits  36    Date for OT Re-Evaluation  10/22/17    OT Start Time  0100    OT Stop Time  0200    OT Time Calculation (min)  60 min    Activity Tolerance  Patient tolerated treatment well;No increased pain    Behavior During Therapy  WFL for tasks assessed/performed       Past Medical History:  Diagnosis Date  . Allergy    allergic rhinitis  . Anemia   . Anxiety   . Arthritis    osteoarthritis  . Asthma   . Claustrophobia    does not like oxygen mask covering face  . Depression   . Diabetes mellitus without complication ([place])   . Fall 2016  . Fatty liver 07/2008   abd. ultrasound - fatty liver ; slt dilated cbd (no stones ) 06/10// abd.  ultrasound normal on 04/2006  . Fibromyalgia   . GERD (gastroesophageal reflux disease)    EGD negative 06/2001// EGD erythematous mucosa, polyp 08/2008  . High uric acid in 24 hour urine specimen   . Hyperhidrosis   . Hyperlipidemia   . Kidney stones   . Lymphedema   . Neuromuscular disorder (HCC)    fibromyalgia  . Obesity   . Shortness of breath dyspnea   . Tachycardia   . TMJ (dislocation of temporomandibular joint)   . UTI (lower urinary tract infection)     Past Surgical History:  Procedure Laterality Date  . BREAST BIOPSY Right 2016   neg  . BREAST LUMPECTOMY WITH RADIOACTIVE SEED LOCALIZATION Right 08/02/2014   Procedure: BREAST LUMPECTOMY WITH RADIOACTIVE SEED LOCALIZATION;  Surgeon: Rolm Bookbinder, MD;  Location: [facility];  Service: General;  Laterality: Right;  . COLONOSCOPY  08/12/2012   Completed by Dr. Phylis Bougie, normal exam.   .  ENDOMETRIAL BIOPSY  01/2004  . ESOPHAGOGASTRODUODENOSCOPY    . FOOT SURGERY    . SEPTOPLASTY     two times  . TONSILLECTOMY    . UTERINE FIBROID SURGERY  06/2005   ablation  . uterine tumor  09/2001  . VAGUS NERVE STIMULATOR INSERTION      There were no vitals filed for this visit.  Subjective Assessment - 09/23/17 1647    Subjective   Ms. Yera is here for OT visit 16/36 for Intensive Phase CDT to BLE, with inital emphasis on LLE. Pt brings R and L custom compression knee highs to clinic for assessment.    Pertinent History  Primary LE dx at age 60 is consistent with LE Praecox, Lymphedema praecox, also known as Meige disease, is the most common form of primary lymphedema. By definition, this disease becomes clinically evident after birth and before age 43 years. L ankle fx 2017, obesity, depression, OA- B knees, chromnic back pain, type II diabetes     Limitations  chronic leg pain and swelling, impaired standing tolerance, difficulty fitting LB clothing and street shoes,     Repetition  Increases Symptoms    Patient Stated Goals  get swelling down and relieve leg pain so I can do more  Currently in Pain?  Yes   chronic pain unchanged   Pain Onset  Other (comment)   chronic                  OT Treatments/Exercises (OP) - 09/23/17 0001      ADLs   ADL Education Given  Yes      Manual Therapy   Manual Therapy  Edema management    Manual therapy comments  BLE custom knee high compression garments fitted and assessed             OT Education - 09/23/17 1649    Education Details  Continued skilled Pt/caregiver education  And LE ADL training throughout visit for lymphedema self care/ home program, including kinesio tape wear and care, compression wrapping, compression garment and device wear/care, lymphatic pumping ther ex, simple self-MLD, and skin care. Discussed progress towards goals.     Person(s) Educated  Patient    Methods  Explanation;Demonstration     Comprehension  Verbalized understanding;Returned demonstration          OT Long Term Goals - 09/03/17 1427      OT LONG TERM GOAL #1   Title  Pt independent w/ lymphedema precautions/prevention principals and using printed reference to limit LE progression and infection risk.    Baseline  Max A    Time  2    Period  Weeks    Status  Achieved      OT LONG TERM GOAL #2   Title  Lymphedema (LE) management/ self-care: Pt able to apply knee  length gradient compression wraps using correct techniques with modified independence ( extra time and assistive device/s PRN)   within 2 weeks to achieve optimal limb volume reduction during Intensive Phase of CDT and optimal self-management over time.    Baseline  max A  09/03/17: Pt unable to apply compression wraps. Mother assists her during visit intervals    Time  2    Period  Weeks    Status  Not Met      OT LONG TERM GOAL #3   Title  Lymphedema (LE) management/ self-care:  Pt to achieve at least 10%  limb volume reductions bilaterally  during Intensive Phase CDT to limit LE progression, to decrease infection risk and to improve functional performance in all occupational domains.     Baseline  max a  09/03/17: To date LLE limb volume reduction actually measures as a 2.01% increase below the knee despite consistent compression wraps and clinical MLD. Pt and therapist agree on plan to fit compression garments ASAP to maximize mobility.    Time  12    Period  Weeks    Status  Partially Met      OT LONG TERM GOAL #4   Title  Lymphedema (LE) management/ self-care:  Pt to tolerate daily compression garments and/ or HOS devices in keeping w/ prescribed wear regime within 1 week of issue date to restore normal limb shape to reduce tissue density and protein build up leading to progression.    Baseline  max A 09/03/17: Garments on order. Not yet fit    Time  12    Period  Weeks    Status  On-going      OT LONG TERM GOAL #5   Title  Lymphedema (LE)  Pain: Pt to report 25% reduction in discomfort/ pain described as LE  "heaviness", "fullness", tightness"" by DC to facilitate improved functional ambulation and mobility and increased independence  with basic and instrumental ADLs.     Baseline  max A    Time  12    Period  Weeks    Status  Partially Met      OT LONG TERM GOAL #6   Title  Lymphedema (LE) management/ self-care:  During Management Phase CDT Pt to sustain reduced limb volumes achieved during Intensive Phase CDT during Management Phase CDT within 3% using LE self-care home program components as directed ( simple self MLD, skin care, ther ex and daily/ nightly compression garments/ devices) to limit LE progression and further functional decline.    Baseline  max a    Time  6    Period  Months    Status  On-going            Plan - 09/23/17 1650    Clinical Impression Statement  BLE custom, Elvarex, knee highs fit appropriately , and Pt is able to don and doff them using assistive devices with modified independence and extra time ( friction mat, friction gloves, and medi butker OFF) after skilled training and several practice trials Cont Pt edu for garment wear and care next visit. Complete final volumetrics. Cont as per POC.    Occupational Profile and client history currently impacting functional performance  Pt states episodes of severe depression may impact participation in OT    Occupational performance deficits (Please refer to evaluation for details):  ADL's;IADL's;Work;Leisure;Social Participation;Other   decreased body image   Rehab Potential  Good    OT Frequency  3x / week    OT Duration  12 weeks    OT Treatment/Interventions  Self-care/ADL training;Therapeutic exercise;Manual lymph drainage;Coping strategies training;Compression bandaging;Patient/family education;Other (comment);Energy conservation;Therapist, nutritional;Therapeutic activities;DME and/or AE instruction;Manual Therapy    Plan  Fit with knee  length BLE compression garments for daytime use and knee length HOS device to limit fibrosis formation and improve lymphatic function during HOS.    Clinical Decision Making  Several treatment options, min-mod task modification necessary    Consulted and Agree with Plan of Care  Patient       Patient will benefit from skilled therapeutic intervention in order to improve the following deficits and impairments:  Abnormal gait, Decreased knowledge of use of DME, Decreased skin integrity, Increased edema, Impaired flexibility, Pain, Decreased mobility, Impaired sensation, Decreased coping skills, Decreased range of motion, Decreased activity tolerance, Decreased knowledge of precautions, Difficulty walking, Obesity  Visit Diagnosis: Lymphedema, not elsewhere classified    Problem List Patient Active Problem List   Diagnosis Date Noted  . Cutaneous skin tags 08/22/2017  . Eating disorder 05/23/2017  . Abdominal pain, right upper quadrant 05/01/2017  . Gallstones 04/19/2017  . Diabetic polyneuropathy associated with type 2 diabetes mellitus ([place]) 04/07/2017  . Diabetic angiopathy ([place]) 04/07/2017  . Vasomotor symptoms due to menopause 04/07/2017  . Mobility impaired 11/12/2016  . Diarrhea 09/05/2016  . Urinary incontinence 12/02/2015  . Candidal intertrigo 04/06/2015  . Cystocele 04/06/2015  . Constipation 04/06/2015  . Back pain 12/28/2014  . Hypertriglyceridemia 10/14/2014  . Breast mass in female 07/28/2014  . Fall 07/23/2013  . Great toe pain 05/12/2013  . Medication management 11/20/2012  . Abdominal pain 04/23/2012  . Uric acid kidney stone 11/20/2011  . HYPERHIDROSIS 11/07/2009  . Menopausal symptoms 06/17/2009  . Type 2 diabetes mellitus without complication, without long-term current use of insulin ([place]) 08/23/2008  . VITAMIN D DEFICIENCY 05/07/2008  . VITAMIN B12 DEFICIENCY 06/04/2007  . CHRONIC FATIGUE SYNDROME 11/15/2006  .  DIVERTICULITIS, HX OF 11/15/2006  . PCOS  (polycystic ovarian syndrome) 10/16/2006  . Hyperlipidemia LDL goal <100 10/16/2006  . Morbid obesity with BMI of 50.0-59.9, adult ([place]) 10/16/2006  . Generalized anxiety disorder 10/16/2006  . PANIC DISORDER 10/16/2006  . BULIMIA 10/16/2006  . Chronic depression 10/16/2006  . CARPAL TUNNEL SYNDROME 10/16/2006  . LYMPHEDEMA 10/16/2006  . Allergic rhinitis 10/16/2006  . Mild intermittent asthma 10/16/2006  . TMJ SYNDROME 10/16/2006  . GERD 10/16/2006  . IRRITABLE BOWEL SYNDROME 10/16/2006  . OSTEOARTHRITIS 10/16/2006  . Myalgia and myositis 10/16/2006  . COUGH, CHRONIC 10/16/2006    Andrey Spearman, MS, OTR/L, [facility] 09/23/17 4:53 PM  [place] MAIN [facility] SERVICES [address] [place], [place], [address] Phone: [phone]   Fax:  [phone]  Name: SARHA BARTELT MRN: [id number] Date of Birth: 1965/11/08

## 2017-09-24 ENCOUNTER — Ambulatory Visit: Payer: Self-pay | Admitting: *Deleted

## 2017-09-24 ENCOUNTER — Encounter: Payer: Self-pay | Admitting: Occupational Therapy

## 2017-09-26 ENCOUNTER — Ambulatory Visit: Payer: Medicare Other | Admitting: Occupational Therapy

## 2017-09-26 DIAGNOSIS — I890 Lymphedema, not elsewhere classified: Secondary | ICD-10-CM

## 2017-09-26 NOTE — Therapy (Signed)
[place] MAIN [facility] SERVICES [address] [place], [place], [address] Phone: [phone]   Fax:  [phone]  Occupational Therapy Treatment  Patient Details  Name: Mackenzie Bonilla MRN: [id number] Date of Birth: 12-29-1965 No data recorded  Encounter Date: 09/26/2017  OT End of Session - 09/26/17 1634    Visit Number  16    Number of Visits  36    Date for OT Re-Evaluation  10/22/17    OT Start Time  0100    OT Stop Time  0213    OT Time Calculation (min)  73 min    Activity Tolerance  Patient tolerated treatment well;No increased pain    Behavior During Therapy  WFL for tasks assessed/performed       Past Medical History:  Diagnosis Date  . Allergy    allergic rhinitis  . Anemia   . Anxiety   . Arthritis    osteoarthritis  . Asthma   . Claustrophobia    does not like oxygen mask covering face  . Depression   . Diabetes mellitus without complication ([place])   . Fall 2016  . Fatty liver 07/2008   abd. ultrasound - fatty liver ; slt dilated cbd (no stones ) 06/10// abd.  ultrasound normal on 04/2006  . Fibromyalgia   . GERD (gastroesophageal reflux disease)    EGD negative 06/2001// EGD erythematous mucosa, polyp 08/2008  . High uric acid in 24 hour urine specimen   . Hyperhidrosis   . Hyperlipidemia   . Kidney stones   . Lymphedema   . Neuromuscular disorder (HCC)    fibromyalgia  . Obesity   . Shortness of breath dyspnea   . Tachycardia   . TMJ (dislocation of temporomandibular joint)   . UTI (lower urinary tract infection)     Past Surgical History:  Procedure Laterality Date  . BREAST BIOPSY Right 2016   neg  . BREAST LUMPECTOMY WITH RADIOACTIVE SEED LOCALIZATION Right 08/02/2014   Procedure: BREAST LUMPECTOMY WITH RADIOACTIVE SEED LOCALIZATION;  Surgeon: Rolm Bookbinder, MD;  Location: [place];  Service: General;  Laterality: Right;  . COLONOSCOPY  08/12/2012   Completed by Dr. Phylis Bougie, normal exam.   .  ENDOMETRIAL BIOPSY  01/2004  . ESOPHAGOGASTRODUODENOSCOPY    . FOOT SURGERY    . SEPTOPLASTY     two times  . TONSILLECTOMY    . UTERINE FIBROID SURGERY  06/2005   ablation  . uterine tumor  09/2001  . VAGUS NERVE STIMULATOR INSERTION      There were no vitals filed for this visit.  Subjective Assessment - 09/26/17 1632    Subjective   Mackenzie Bonilla is here for OT visit 17/36 for Intensive Phase CDT to BLE, with inital emphasis on LLE. Mackenzie Bonilla wears BLE custom compression     knee highs to clinic. OPt c/o "pinching" at anterior ankles, tifgght ness in ankle region    on LLE and pinching iat back of calf  where seam is located.    Pertinent History  Primary LE dx at age 20 is consistent with LE Praecox, Lymphedema praecox, also known as Meige disease, is the most common form of primary lymphedema. By definition, this disease becomes clinically evident after birth and before age 41 years. L ankle fx 2017, obesity, depression, OA- B knees, chromnic back pain, type II diabetes     Limitations  chronic leg pain and swelling, impaired standing tolerance, difficulty fitting LB clothing and street shoes,  Repetition  Increases Symptoms    Patient Stated Goals  get swelling down and relieve leg pain so I can do more    Currently in Pain?  Yes   chronic pain unchanged.   Pain Onset  Other (comment)   chronic                  OT Treatments/Exercises (OP) - 09/26/17 0001      ADLs   ADL Education Given  Yes      Manual Therapy   Manual Therapy  Edema management             OT Education - 09/26/17 1634    Education Details  Continued skilled Mackenzie Bonilla/caregiver education  And LE ADL training throughout visit for lymphedema self care/ home program, including kinesio tape wear and care, compression wrapping, compression garment and device wear/care, lymphatic pumping ther ex, simple self-MLD, and skin care. Discussed progress towards goals.     Person(s) Educated  Patient    Methods   Explanation;Demonstration    Comprehension  Verbalized understanding;Returned demonstration          OT Long Term Goals - 09/03/17 1427      OT LONG TERM GOAL #1   Title  Mackenzie Bonilla independent w/ lymphedema precautions/prevention principals and using printed reference to limit LE progression and infection risk.    Baseline  Max A    Time  2    Period  Weeks    Status  Achieved      OT LONG TERM GOAL #2   Title  Lymphedema (LE) management/ self-care: Mackenzie Bonilla able to apply knee  length gradient compression wraps using correct techniques with modified independence ( extra time and assistive device/s PRN)   within 2 weeks to achieve optimal limb volume reduction during Intensive Phase of CDT and optimal self-management over time.    Baseline  max A  09/03/17: Mackenzie Bonilla unable to apply compression wraps. Mother assists her during visit intervals    Time  2    Period  Weeks    Status  Not Met      OT LONG TERM GOAL #3   Title  Lymphedema (LE) management/ self-care:  Mackenzie Bonilla to achieve at least 10%  limb volume reductions bilaterally  during Intensive Phase CDT to limit LE progression, to decrease infection risk and to improve functional performance in all occupational domains.     Baseline  max a  09/03/17: To date LLE limb volume reduction actually measures as a 2.01% increase below the knee despite consistent compression wraps and clinical MLD. Mackenzie Bonilla and therapist agree on plan to fit compression garments ASAP to maximize mobility.    Time  12    Period  Weeks    Status  Partially Met      OT LONG TERM GOAL #4   Title  Lymphedema (LE) management/ self-care:  Mackenzie Bonilla to tolerate daily compression garments and/ or HOS devices in keeping w/ prescribed wear regime within 1 week of issue date to restore normal limb shape to reduce tissue density and protein build up leading to progression.    Baseline  max A 09/03/17: Garments on order. Not yet fit    Time  12    Period  Weeks    Status  On-going      OT LONG TERM GOAL #5    Title  Lymphedema (LE) Pain: Mackenzie Bonilla to report 25% reduction in discomfort/ pain described as LE  "heaviness", "fullness", tightness"" by DC  to facilitate improved functional ambulation and mobility and increased independence with basic and instrumental ADLs.     Baseline  max A    Time  12    Period  Weeks    Status  Partially Met      OT LONG TERM GOAL #6   Title  Lymphedema (LE) management/ self-care:  During Management Phase CDT Mackenzie Bonilla to sustain reduced limb volumes achieved during Intensive Phase CDT during Management Phase CDT within 3% using LE self-care home program components as directed ( simple self MLD, skin care, ther ex and daily/ nightly compression garments/ devices) to limit LE progression and further functional decline.    Baseline  max a    Time  6    Period  Months    Status  On-going            Plan - 09/26/17 1635    Clinical Impression Statement  Mackenzie Bonilla frustrated today with compression garments as they are challeninging to don and doff and also somewhat uncomforable in areas outlined in [place] section of note. Removed Tricot patched anterior ankles to improve multidirectional stretch and reduce tightness , or :"pinching in this region biolaterally. Remainder of session spent ion edu for donning garmnents correctly using multiple assistive devices as discomfor Mackenzie Bonilla is experiencing is most likely caused by not pulling stockings all the way up. Fabric density is not properly distributed to alighn compression change landmarks on legs and dense fabric   is tight. Mackenzie Bonilla able to don garment using 7"wide compression garment donnor made by [employer] using modified Tyvek sock and extra time. Mackenzie Bonilla will launder garment over the weekend which will also soften it up. Mackenzie Bonilla also tried garment adhesive in an effort to keep top edgof garment in place. With garment in the correct orientation I believe it will stay up better. If these trials dont solve  the problem we'll request remakes with  slightly larger anatomical measurements and  add 5 cm silicone band. Cont as per POC.    Occupational Profile and client history currently impacting functional performance  Mackenzie Bonilla states episodes of severe depression may impact participation in OT    Occupational performance deficits (Please refer to evaluation for details):  ADL's;IADL's;Work;Leisure;Social Participation;Other   decreased body image   Rehab Potential  Good    OT Frequency  3x / week    OT Duration  12 weeks    OT Treatment/Interventions  Self-care/ADL training;Therapeutic exercise;Manual lymph drainage;Coping strategies training;Compression bandaging;Patient/family education;Other (comment);Energy conservation;Therapist, nutritional;Therapeutic activities;DME and/or AE instruction;Manual Therapy    Plan  Fit with knee length BLE compression garments for daytime use and knee length HOS device to limit fibrosis formation and improve lymphatic function during HOS.    Clinical Decision Making  Several treatment options, min-mod task modification necessary    Consulted and Agree with Plan of Care  Patient       Patient will benefit from skilled therapeutic intervention in order to improve the following deficits and impairments:  Abnormal gait, Decreased knowledge of use of DME, Decreased skin integrity, Increased edema, Impaired flexibility, Pain, Decreased mobility, Impaired sensation, Decreased coping skills, Decreased range of motion, Decreased activity tolerance, Decreased knowledge of precautions, Difficulty walking, Obesity  Visit Diagnosis: Lymphedema, not elsewhere classified    Problem List Patient Active Problem List   Diagnosis Date Noted  . Cutaneous skin tags 08/22/2017  . Eating disorder 05/23/2017  . Abdominal pain, right upper quadrant 05/01/2017  . Gallstones 04/19/2017  . Diabetic polyneuropathy  associated with type 2 diabetes mellitus ([place]) 04/07/2017  . Diabetic angiopathy ([place]) 04/07/2017  . Vasomotor  symptoms due to menopause 04/07/2017  . Mobility impaired 11/12/2016  . Diarrhea 09/05/2016  . Urinary incontinence 12/02/2015  . Candidal intertrigo 04/06/2015  . Cystocele 04/06/2015  . Constipation 04/06/2015  . Back pain 12/28/2014  . Hypertriglyceridemia 10/14/2014  . Breast mass in female 07/28/2014  . Fall 07/23/2013  . Great toe pain 05/12/2013  . Medication management 11/20/2012  . Abdominal pain 04/23/2012  . Uric acid kidney stone 11/20/2011  . HYPERHIDROSIS 11/07/2009  . Menopausal symptoms 06/17/2009  . Type 2 diabetes mellitus without complication, without long-term current use of insulin ([place]) 08/23/2008  . VITAMIN D DEFICIENCY 05/07/2008  . VITAMIN B12 DEFICIENCY 06/04/2007  . CHRONIC FATIGUE SYNDROME 11/15/2006  . DIVERTICULITIS, HX OF 11/15/2006  . PCOS (polycystic ovarian syndrome) 10/16/2006  . Hyperlipidemia LDL goal <100 10/16/2006  . Morbid obesity with BMI of 50.0-59.9, adult ([place]) 10/16/2006  . Generalized anxiety disorder 10/16/2006  . PANIC DISORDER 10/16/2006  . BULIMIA 10/16/2006  . Chronic depression 10/16/2006  . CARPAL TUNNEL SYNDROME 10/16/2006  . LYMPHEDEMA 10/16/2006  . Allergic rhinitis 10/16/2006  . Mild intermittent asthma 10/16/2006  . TMJ SYNDROME 10/16/2006  . GERD 10/16/2006  . IRRITABLE BOWEL SYNDROME 10/16/2006  . OSTEOARTHRITIS 10/16/2006  . Myalgia and myositis 10/16/2006  . COUGH, CHRONIC 10/16/2006    Andrey Spearman, MS, OTR/L, [facility] 09/26/17 4:42 PM  [place] MAIN [facility] SERVICES [address] [place], [place], [address] Phone: [phone]   Fax:  [phone]  Name: FILOMENA POKORNEY MRN: [id number] Date of Birth: 1965-04-08

## 2017-09-30 ENCOUNTER — Ambulatory Visit: Payer: Medicare Other | Admitting: Occupational Therapy

## 2017-09-30 DIAGNOSIS — I890 Lymphedema, not elsewhere classified: Secondary | ICD-10-CM

## 2017-10-01 ENCOUNTER — Encounter: Payer: Self-pay | Admitting: Occupational Therapy

## 2017-10-01 NOTE — Therapy (Signed)
[place] MAIN [facility] SERVICES [address] [place], [place], [address] Phone: [phone]   Fax:  [phone]  Occupational Therapy Treatment  Patient Details  Name: Mackenzie Bonilla MRN: [id number] Date of Birth: Mar 12, 1965 No data recorded  Encounter Date: 09/30/2017  OT End of Session - 10/01/17 1158    Visit Number  17    Number of Visits  36    Date for OT Re-Evaluation  10/22/17    OT Start Time  0100    OT Stop Time  0200    OT Time Calculation (min)  60 min    Activity Tolerance  Patient tolerated treatment well;No increased pain    Behavior During Therapy  WFL for tasks assessed/performed       Past Medical History:  Diagnosis Date  . Allergy    allergic rhinitis  . Anemia   . Anxiety   . Arthritis    osteoarthritis  . Asthma   . Claustrophobia    does not like oxygen mask covering face  . Depression   . Diabetes mellitus without complication ([employer])   . Fall 2016  . Fatty liver 07/2008   abd. ultrasound - fatty liver ; slt dilated cbd (no stones ) 06/10// abd.  ultrasound normal on 04/2006  . Fibromyalgia   . GERD (gastroesophageal reflux disease)    EGD negative 06/2001// EGD erythematous mucosa, polyp 08/2008  . High uric acid in 24 hour urine specimen   . Hyperhidrosis   . Hyperlipidemia   . Kidney stones   . Lymphedema   . Neuromuscular disorder (HCC)    fibromyalgia  . Obesity   . Shortness of breath dyspnea   . Tachycardia   . TMJ (dislocation of temporomandibular joint)   . UTI (lower urinary tract infection)     Past Surgical History:  Procedure Laterality Date  . BREAST BIOPSY Right 2016   neg  . BREAST LUMPECTOMY WITH RADIOACTIVE SEED LOCALIZATION Right 08/02/2014   Procedure: BREAST LUMPECTOMY WITH RADIOACTIVE SEED LOCALIZATION;  Surgeon: Rolm Bookbinder, MD;  Location: [place];  Service: General;  Laterality: Right;  . COLONOSCOPY  08/12/2012   Completed by Dr. Phylis Bougie, normal exam.   .  ENDOMETRIAL BIOPSY  01/2004  . ESOPHAGOGASTRODUODENOSCOPY    . FOOT SURGERY    . SEPTOPLASTY     two times  . TONSILLECTOMY    . UTERINE FIBROID SURGERY  06/2005   ablation  . uterine tumor  09/2001  . VAGUS NERVE STIMULATOR INSERTION      There were no vitals filed for this visit.  Subjective Assessment - 09/30/17 1154    Subjective   Ms. Sturgell is here for OT visit 18/36 for Intensive Phase CDT to BLE, with inital emphasis on LLE. Pt wears BLE custom compression  knee highs to clinic. Pt reports she laundered compression knee highs as instructed, but continues to have difficulty donning and doffing garments. "They are still pinching at the front of my ankle and it makes my arms hurt to take them off and put them on."    Pertinent History  Primary LE dx at age 52 is consistent with LE Praecox, Lymphedema praecox, also known as Meige disease, is the most common form of primary lymphedema. By definition, this disease becomes clinically evident after birth and before age 44 years. L ankle fx 2017, obesity, depression, OA- B knees, chromnic back pain, type II diabetes     Limitations  chronic leg pain and  swelling, impaired standing tolerance, difficulty fitting LB clothing and street shoes,     Repetition  Increases Symptoms    Patient Stated Goals  get swelling down and relieve leg pain so I can do more    Currently in Pain?  Yes   Pt continues to have chronic pain due to multiple comorbidities. Not rated today.   Pain Location  Leg    Pain Orientation  Right;Left    Pain Onset  Other (comment)   chronic                  OT Treatments/Exercises (OP) - 10/01/17 0001      ADLs   ADL Education Given  Yes             OT Education - 10/01/17 1157    Education Details  Continued Pt edu for donning and doffing customn compression garments using assistive devices.    Person(s) Educated  Patient    Methods  Explanation;Demonstration    Comprehension  Verbalized  understanding;Returned demonstration          OT Long Term Goals - 09/03/17 1427      OT LONG TERM GOAL #1   Title  Pt independent w/ lymphedema precautions/prevention principals and using printed reference to limit LE progression and infection risk.    Baseline  Max A    Time  2    Period  Weeks    Status  Achieved      OT LONG TERM GOAL #2   Title  Lymphedema (LE) management/ self-care: Pt able to apply knee  length gradient compression wraps using correct techniques with modified independence ( extra time and assistive device/s PRN)   within 2 weeks to achieve optimal limb volume reduction during Intensive Phase of CDT and optimal self-management over time.    Baseline  max A  09/03/17: Pt unable to apply compression wraps. Mother assists her during visit intervals    Time  2    Period  Weeks    Status  Not Met      OT LONG TERM GOAL #3   Title  Lymphedema (LE) management/ self-care:  Pt to achieve at least 10%  limb volume reductions bilaterally  during Intensive Phase CDT to limit LE progression, to decrease infection risk and to improve functional performance in all occupational domains.     Baseline  max a  09/03/17: To date LLE limb volume reduction actually measures as a 2.01% increase below the knee despite consistent compression wraps and clinical MLD. Pt and therapist agree on plan to fit compression garments ASAP to maximize mobility.    Time  12    Period  Weeks    Status  Partially Met      OT LONG TERM GOAL #4   Title  Lymphedema (LE) management/ self-care:  Pt to tolerate daily compression garments and/ or HOS devices in keeping w/ prescribed wear regime within 1 week of issue date to restore normal limb shape to reduce tissue density and protein build up leading to progression.    Baseline  max A 09/03/17: Garments on order. Not yet fit    Time  12    Period  Weeks    Status  On-going      OT LONG TERM GOAL #5   Title  Lymphedema (LE) Pain: Pt to report 25%  reduction in discomfort/ pain described as LE  "heaviness", "fullness", tightness"" by DC to facilitate improved functional ambulation and  mobility and increased independence with basic and instrumental ADLs.     Baseline  max A    Time  12    Period  Weeks    Status  Partially Met      OT LONG TERM GOAL #6   Title  Lymphedema (LE) management/ self-care:  During Management Phase CDT Pt to sustain reduced limb volumes achieved during Intensive Phase CDT during Management Phase CDT within 3% using LE self-care home program components as directed ( simple self MLD, skin care, ther ex and daily/ nightly compression garments/ devices) to limit LE progression and further functional decline.    Baseline  max a    Time  6    Period  Months    Status  On-going            Plan - 09/30/17 1158    Clinical Impression Statement  Continued Pt edu today throughout session for donning and doffing custom comp5rssion garments using assistive devices. Pt continues to have difficulty bending  to reach feet due to body habitus. Low frustration tolerance during session presents a significant obstacle to sucess. Pt tearful off and on throughout session. Cont as per POC. Cont to support Pt to achieve ralistic perception of her functional abilities  and to support growth of coping skills in clinical setting.     Occupational Profile and client history currently impacting functional performance  Pt states episodes of severe depression may impact participation in OT    Occupational performance deficits (Please refer to evaluation for details):  ADL's;IADL's;Work;Leisure;Social Participation;Other   decreased body image   Rehab Potential  Good    OT Frequency  3x / week    OT Duration  12 weeks    OT Treatment/Interventions  Self-care/ADL training;Therapeutic exercise;Manual lymph drainage;Coping strategies training;Compression bandaging;Patient/family education;Other (comment);Energy conservation;Advertising account executive;Therapeutic activities;DME and/or AE instruction;Manual Therapy    Plan  Fit with knee length BLE compression garments for daytime use and knee length HOS device to limit fibrosis formation and improve lymphatic function during HOS.    Clinical Decision Making  Several treatment options, min-mod task modification necessary    Consulted and Agree with Plan of Care  Patient       Patient will benefit from skilled therapeutic intervention in order to improve the following deficits and impairments:  Abnormal gait, Decreased knowledge of use of DME, Decreased skin integrity, Increased edema, Impaired flexibility, Pain, Decreased mobility, Impaired sensation, Decreased coping skills, Decreased range of motion, Decreased activity tolerance, Decreased knowledge of precautions, Difficulty walking, Obesity  Visit Diagnosis: Lymphedema, not elsewhere classified    Problem List Patient Active Problem List   Diagnosis Date Noted  . Cutaneous skin tags 08/22/2017  . Eating disorder 05/23/2017  . Abdominal pain, right upper quadrant 05/01/2017  . Gallstones 04/19/2017  . Diabetic polyneuropathy associated with type 2 diabetes mellitus ([place]) 04/07/2017  . Diabetic angiopathy ([place]) 04/07/2017  . Vasomotor symptoms due to menopause 04/07/2017  . Mobility impaired 11/12/2016  . Diarrhea 09/05/2016  . Urinary incontinence 12/02/2015  . Candidal intertrigo 04/06/2015  . Cystocele 04/06/2015  . Constipation 04/06/2015  . Back pain 12/28/2014  . Hypertriglyceridemia 10/14/2014  . Breast mass in female 07/28/2014  . Fall 07/23/2013  . Great toe pain 05/12/2013  . Medication management 11/20/2012  . Abdominal pain 04/23/2012  . Uric acid kidney stone 11/20/2011  . HYPERHIDROSIS 11/07/2009  . Menopausal symptoms 06/17/2009  . Type 2 diabetes mellitus without complication, without long-term current use of insulin (  [place]) 08/23/2008  . VITAMIN D DEFICIENCY 05/07/2008  . VITAMIN B12  DEFICIENCY 06/04/2007  . CHRONIC FATIGUE SYNDROME 11/15/2006  . DIVERTICULITIS, HX OF 11/15/2006  . PCOS (polycystic ovarian syndrome) 10/16/2006  . Hyperlipidemia LDL goal <100 10/16/2006  . Morbid obesity with BMI of 50.0-59.9, adult ([place]) 10/16/2006  . Generalized anxiety disorder 10/16/2006  . PANIC DISORDER 10/16/2006  . BULIMIA 10/16/2006  . Chronic depression 10/16/2006  . CARPAL TUNNEL SYNDROME 10/16/2006  . LYMPHEDEMA 10/16/2006  . Allergic rhinitis 10/16/2006  . Mild intermittent asthma 10/16/2006  . TMJ SYNDROME 10/16/2006  . GERD 10/16/2006  . IRRITABLE BOWEL SYNDROME 10/16/2006  . OSTEOARTHRITIS 10/16/2006  . Myalgia and myositis 10/16/2006  . COUGH, CHRONIC 10/16/2006    Andrey Spearman, MS, OTR/L, [facility] 10/01/17 12:08 PM  [place] MAIN [facility] SERVICES [address] [place], [place], [address] Phone: [phone]   Fax:  [phone]  Name: ALEETA SCHMALTZ MRN: [id number] Date of Birth: Jun 02, 1965

## 2017-10-03 ENCOUNTER — Ambulatory Visit: Payer: Medicare Other | Admitting: Occupational Therapy

## 2017-10-07 ENCOUNTER — Ambulatory Visit: Payer: Medicare Other | Admitting: Occupational Therapy

## 2017-10-07 DIAGNOSIS — I890 Lymphedema, not elsewhere classified: Secondary | ICD-10-CM

## 2017-10-07 NOTE — Therapy (Signed)
[name] MAIN [facility] SERVICES [address] [place], [place], [address] Phone: [phone]   Fax:  [phone]  Occupational Therapy Treatment  Patient Details  Name: Mackenzie Bonilla MRN: [id number] Date of Birth: 04/13/1965 No data recorded  Encounter Date: 10/07/2017  OT End of Session - 10/07/17 1655    Visit Number  18    Number of Visits  36    Date for OT Re-Evaluation  10/22/17    OT Start Time  0100    OT Stop Time  0214    OT Time Calculation (min)  74 min    Activity Tolerance  Patient tolerated treatment well;No increased pain    Behavior During Therapy  WFL for tasks assessed/performed       Past Medical History:  Diagnosis Date  . Allergy    allergic rhinitis  . Anemia   . Anxiety   . Arthritis    osteoarthritis  . Asthma   . Claustrophobia    does not like oxygen mask covering face  . Depression   . Diabetes mellitus without complication ([place])   . Fall 2016  . Fatty liver 07/2008   abd. ultrasound - fatty liver ; slt dilated cbd (no stones ) 06/10// abd.  ultrasound normal on 04/2006  . Fibromyalgia   . GERD (gastroesophageal reflux disease)    EGD negative 06/2001// EGD erythematous mucosa, polyp 08/2008  . High uric acid in 24 hour urine specimen   . Hyperhidrosis   . Hyperlipidemia   . Kidney stones   . Lymphedema   . Neuromuscular disorder (HCC)    fibromyalgia  . Obesity   . Shortness of breath dyspnea   . Tachycardia   . TMJ (dislocation of temporomandibular joint)   . UTI (lower urinary tract infection)     Past Surgical History:  Procedure Laterality Date  . BREAST BIOPSY Right 2016   neg  . BREAST LUMPECTOMY WITH RADIOACTIVE SEED LOCALIZATION Right 08/02/2014   Procedure: BREAST LUMPECTOMY WITH RADIOACTIVE SEED LOCALIZATION;  Surgeon: Rolm Bookbinder, MD;  Location: [place];  Service: General;  Laterality: Right;  . COLONOSCOPY  08/12/2012   Completed by Dr. Phylis Bougie, normal exam.   .  ENDOMETRIAL BIOPSY  01/2004  . ESOPHAGOGASTRODUODENOSCOPY    . FOOT SURGERY    . SEPTOPLASTY     two times  . TONSILLECTOMY    . UTERINE FIBROID SURGERY  06/2005   ablation  . uterine tumor  09/2001  . VAGUS NERVE STIMULATOR INSERTION      There were no vitals filed for this visit.  Subjective Assessment - 10/07/17 1651    Subjective   Mackenzie Bonilla is here for OT visit 19/36 for Intensive Phase CDT to BLE, Pt presents w/ compression garments in place. She reports that she has worn them nearly every day since last visit, but she continues to have difficulty w/ donning and doffing. Pt reports she is working on upper body strengthening with her PT .    Pertinent History  Primary LE dx at age 10 is consistent with LE Praecox, Lymphedema praecox, also known as Meige disease, is the most common form of primary lymphedema. By definition, this disease becomes clinically evident after birth and before age 73 years. L ankle fx 2017, obesity, depression, OA- B knees, chromnic back pain, type II diabetes     Limitations  chronic leg pain and swelling, impaired standing tolerance, difficulty fitting LB clothing and street shoes,  Repetition  Increases Symptoms    Patient Stated Goals  get swelling down and relieve leg pain so I can do more    Currently in Pain?  Yes   chronicv pain unchanged. Not rated today   Pain Onset  Other (comment)   chronic                  OT Treatments/Exercises (OP) - 10/07/17 0001      ADLs   ADL Education Given  Yes      Manual Therapy   Manual Therapy  Edema management    Manual therapy comments  Repeated anatomical measurements of BLE for custom compression garment remake    Compression Bandaging  Max A to don compression garments at end of session due to time constraints.             OT Education - 10/07/17 1655    Education Details  Pt educated re adjustments  to custom compression garments to improve fit, function and comfort  throughout session.    Person(s) Educated  Patient    Methods  Explanation;Demonstration    Comprehension  Verbalized understanding;Returned demonstration          OT Long Term Goals - 09/03/17 1427      OT LONG TERM GOAL #1   Title  Pt independent w/ lymphedema precautions/prevention principals and using printed reference to limit LE progression and infection risk.    Baseline  Max A    Time  2    Period  Weeks    Status  Achieved      OT LONG TERM GOAL #2   Title  Lymphedema (LE) management/ self-care: Pt able to apply knee  length gradient compression wraps using correct techniques with modified independence ( extra time and assistive device/s PRN)   within 2 weeks to achieve optimal limb volume reduction during Intensive Phase of CDT and optimal self-management over time.    Baseline  max A  09/03/17: Pt unable to apply compression wraps. Mother assists her during visit intervals    Time  2    Period  Weeks    Status  Not Met      OT LONG TERM GOAL #3   Title  Lymphedema (LE) management/ self-care:  Pt to achieve at least 10%  limb volume reductions bilaterally  during Intensive Phase CDT to limit LE progression, to decrease infection risk and to improve functional performance in all occupational domains.     Baseline  max a  09/03/17: To date LLE limb volume reduction actually measures as a 2.01% increase below the knee despite consistent compression wraps and clinical MLD. Pt and therapist agree on plan to fit compression garments ASAP to maximize mobility.    Time  12    Period  Weeks    Status  Partially Met      OT LONG TERM GOAL #4   Title  Lymphedema (LE) management/ self-care:  Pt to tolerate daily compression garments and/ or HOS devices in keeping w/ prescribed wear regime within 1 week of issue date to restore normal limb shape to reduce tissue density and protein build up leading to progression.    Baseline  max A 09/03/17: Garments on order. Not yet fit    Time  12     Period  Weeks    Status  On-going      OT LONG TERM GOAL #5   Title  Lymphedema (LE) Pain: Pt to report 25%  reduction in discomfort/ pain described as LE  "heaviness", "fullness", tightness"" by DC to facilitate improved functional ambulation and mobility and increased independence with basic and instrumental ADLs.     Baseline  max A    Time  12    Period  Weeks    Status  Partially Met      OT LONG TERM GOAL #6   Title  Lymphedema (LE) management/ self-care:  During Management Phase CDT Pt to sustain reduced limb volumes achieved during Intensive Phase CDT during Management Phase CDT within 3% using LE self-care home program components as directed ( simple self MLD, skin care, ther ex and daily/ nightly compression garments/ devices) to limit LE progression and further functional decline.    Baseline  max a    Time  6    Period  Months    Status  On-going            Plan - 10/07/17 1656    Clinical Impression Statement  Completed new anatomical measurements to remake custom compression knee highs to improve fit, funtion and comfort. Added oblique top edge by providing medial and lateral leg  length measurenets in effort to increase length and coverage at large calf. Replaced 2.5 cm silicne band w/ 5 cm band at top to keep garments anchored and limit falling down and bunching at ankles.All other measurements, despuite actually be decreased  since initial measurements were taken, were   recorded and submitted as actual measurements. Faxed to DME. Pt agrees to cx remaining treatment   visits and return when remakes are received. Pt transitions to Management Phase CDT today.     Occupational Profile and client history currently impacting functional performance  Pt states episodes of severe depression may impact participation in OT    Occupational performance deficits (Please refer to evaluation for details):  ADL's;IADL's;Work;Leisure;Social Participation;Other   decreased body image    Rehab Potential  Good    OT Frequency  3x / week    OT Duration  12 weeks    OT Treatment/Interventions  Self-care/ADL training;Therapeutic exercise;Manual lymph drainage;Coping strategies training;Compression bandaging;Patient/family education;Other (comment);Energy conservation;Therapist, nutritional;Therapeutic activities;DME and/or AE instruction;Manual Therapy    Plan  Fit with knee length BLE compression garments for daytime use and knee length HOS device to limit fibrosis formation and improve lymphatic function during HOS.    Clinical Decision Making  Several treatment options, min-mod task modification necessary    Consulted and Agree with Plan of Care  Patient       Patient will benefit from skilled therapeutic intervention in order to improve the following deficits and impairments:  Abnormal gait, Decreased knowledge of use of DME, Decreased skin integrity, Increased edema, Impaired flexibility, Pain, Decreased mobility, Impaired sensation, Decreased coping skills, Decreased range of motion, Decreased activity tolerance, Decreased knowledge of precautions, Difficulty walking, Obesity  Visit Diagnosis: Lymphedema, not elsewhere classified    Problem List Patient Active Problem List   Diagnosis Date Noted  . Cutaneous skin tags 08/22/2017  . Eating disorder 05/23/2017  . Abdominal pain, right upper quadrant 05/01/2017  . Gallstones 04/19/2017  . Diabetic polyneuropathy associated with type 2 diabetes mellitus ([place]) 04/07/2017  . Diabetic angiopathy ([place]) 04/07/2017  . Vasomotor symptoms due to menopause 04/07/2017  . Mobility impaired 11/12/2016  . Diarrhea 09/05/2016  . Urinary incontinence 12/02/2015  . Candidal intertrigo 04/06/2015  . Cystocele 04/06/2015  . Constipation 04/06/2015  . Back pain 12/28/2014  . Hypertriglyceridemia 10/14/2014  . Breast mass in  female 07/28/2014  . Fall 07/23/2013  . Great toe pain 05/12/2013  . Medication management 11/20/2012   . Abdominal pain 04/23/2012  . Uric acid kidney stone 11/20/2011  . HYPERHIDROSIS 11/07/2009  . Menopausal symptoms 06/17/2009  . Type 2 diabetes mellitus without complication, without long-term current use of insulin ([place]) 08/23/2008  . VITAMIN D DEFICIENCY 05/07/2008  . VITAMIN B12 DEFICIENCY 06/04/2007  . CHRONIC FATIGUE SYNDROME 11/15/2006  . DIVERTICULITIS, HX OF 11/15/2006  . PCOS (polycystic ovarian syndrome) 10/16/2006  . Hyperlipidemia LDL goal <100 10/16/2006  . Morbid obesity with BMI of 50.0-59.9, adult ([place]) 10/16/2006  . Generalized anxiety disorder 10/16/2006  . PANIC DISORDER 10/16/2006  . BULIMIA 10/16/2006  . Chronic depression 10/16/2006  . CARPAL TUNNEL SYNDROME 10/16/2006  . LYMPHEDEMA 10/16/2006  . Allergic rhinitis 10/16/2006  . Mild intermittent asthma 10/16/2006  . TMJ SYNDROME 10/16/2006  . GERD 10/16/2006  . IRRITABLE BOWEL SYNDROME 10/16/2006  . OSTEOARTHRITIS 10/16/2006  . Myalgia and myositis 10/16/2006  . COUGH, CHRONIC 10/16/2006    Andrey Spearman, MS, OTR/L, [facility] 10/07/17 5:02 PM   [place] MAIN [facility] SERVICES [address] [place], [place], [address] Phone: [phone]   Fax:  [phone]  Name: Mackenzie Bonilla MRN: [id number] Date of Birth: August 04, 1965

## 2017-10-08 ENCOUNTER — Other Ambulatory Visit: Payer: Self-pay | Admitting: Family Medicine

## 2017-10-08 ENCOUNTER — Encounter: Payer: Self-pay | Admitting: Occupational Therapy

## 2017-10-09 ENCOUNTER — Ambulatory Visit: Payer: Self-pay | Admitting: *Deleted

## 2017-10-10 ENCOUNTER — Ambulatory Visit: Payer: Medicare Other | Admitting: Occupational Therapy

## 2017-10-31 ENCOUNTER — Ambulatory Visit: Payer: Medicare Other | Attending: Podiatry | Admitting: Occupational Therapy

## 2017-10-31 DIAGNOSIS — I890 Lymphedema, not elsewhere classified: Secondary | ICD-10-CM | POA: Insufficient documentation

## 2017-10-31 NOTE — Therapy (Signed)
[place] MAIN [facility] SERVICES [address] [place], [place], [address] Phone: [phone]   Fax:  [phone]  Occupational Therapy Treatment  Patient Details  Name: Mackenzie Bonilla MRN: [id number] Date of Birth: Feb 02, 1966 No data recorded  Encounter Date: 10/31/2017  OT End of Session - 10/31/17 1547    Visit Number  19    Number of Visits  36    Date for OT Re-Evaluation  10/22/17    OT Start Time  0100    OT Stop Time  0200    OT Time Calculation (min)  60 min    Activity Tolerance  Patient tolerated treatment well;No increased pain    Behavior During Therapy  WFL for tasks assessed/performed       Past Medical History:  Diagnosis Date  . Allergy    allergic rhinitis  . Anemia   . Anxiety   . Arthritis    osteoarthritis  . Asthma   . Claustrophobia    does not like oxygen mask covering face  . Depression   . Diabetes mellitus without complication ([place])   . Fall 2016  . Fatty liver 07/2008   abd. ultrasound - fatty liver ; slt dilated cbd (no stones ) 06/10// abd.  ultrasound normal on 04/2006  . Fibromyalgia   . GERD (gastroesophageal reflux disease)    EGD negative 06/2001// EGD erythematous mucosa, polyp 08/2008  . High uric acid in 24 hour urine specimen   . Hyperhidrosis   . Hyperlipidemia   . Kidney stones   . Lymphedema   . Neuromuscular disorder (HCC)    fibromyalgia  . Obesity   . Shortness of breath dyspnea   . Tachycardia   . TMJ (dislocation of temporomandibular joint)   . UTI (lower urinary tract infection)     Past Surgical History:  Procedure Laterality Date  . BREAST BIOPSY Right 2016   neg  . BREAST LUMPECTOMY WITH RADIOACTIVE SEED LOCALIZATION Right 08/02/2014   Procedure: BREAST LUMPECTOMY WITH RADIOACTIVE SEED LOCALIZATION;  Surgeon: Rolm Bookbinder, MD;  Location: [place];  Service: General;  Laterality: Right;  . COLONOSCOPY  08/12/2012   Completed by Dr. Phylis Bougie, normal exam.   .  ENDOMETRIAL BIOPSY  01/2004  . ESOPHAGOGASTRODUODENOSCOPY    . FOOT SURGERY    . SEPTOPLASTY     two times  . TONSILLECTOMY    . UTERINE FIBROID SURGERY  06/2005   ablation  . uterine tumor  09/2001  . VAGUS NERVE STIMULATOR INSERTION      There were no vitals filed for this visit.  Subjective Assessment - 10/31/17 1544    Subjective   Ms. Mishkin is here for OT visit 19/36 for Intensive Phase CDT to BLE, Pt presents with remade custom BLE compression knee highs in place. Pt states remade garments arrived yesterday and she's washed and worn them without difficulty.     Pertinent History  Primary LE dx at age 50 is consistent with LE Praecox, Lymphedema praecox, also known as Meige disease, is the most common form of primary lymphedema. By definition, this disease becomes clinically evident after birth and before age 42 years. L ankle fx 2017, obesity, depression, OA- B knees, chromnic back pain, type II diabetes     Limitations  chronic leg pain and swelling, impaired standing tolerance, difficulty fitting LB clothing and street shoes,     Repetition  Increases Symptoms    Patient Stated Goals  get swelling down and relieve  leg pain so I can do more    Pain Onset  Other (comment)   chronic                  OT Treatments/Exercises (OP) - 10/31/17 0001      ADLs   ADL Education Given  Yes      Manual Therapy   Manual Therapy  Edema management    Manual therapy comments  fitting and assessment for remade custom compression knee highs             OT Education - 10/31/17 1546    Education Details  Pt edu for garment fitting, donning and doffing using assistivbe devices.    Person(s) Educated  Patient    Methods  Explanation;Demonstration    Comprehension  Verbalized understanding;Returned demonstration          OT Long Term Goals - 10/31/17 1334      OT LONG TERM GOAL #1   Title  Pt independent w/ lymphedema precautions/prevention principals and using  printed reference to limit LE progression and infection risk.  (Pended)     Baseline  Max A  (Pended)     Time  2  (Pended)     Period  Weeks  (Pended)     Status  Achieved  (Pended)       OT LONG TERM GOAL #2   Title  Lymphedema (LE) management/ self-care: Pt able to apply knee  length gradient compression wraps using correct techniques with modified independence ( extra time and assistive device/s PRN)   within 2 weeks to achieve optimal limb volume reduction during Intensive Phase of CDT and optimal self-management over time.  (Pended)     Baseline  max A  09/03/17: Pt unable to apply compression wraps. Mother assists her during visit intervals  (Pended)     Time  2  (Pended)     Period  Weeks  (Pended)     Status  Achieved  (Pended)       OT LONG TERM GOAL #3   Title  Lymphedema (LE) management/ self-care:  Pt to achieve at least 10%  limb volume reductions bilaterally  during Intensive Phase CDT to limit LE progression, to decrease infection risk and to improve functional performance in all occupational domains.   (Pended)     Baseline  max a  09/03/17: To date LLE limb volume reduction actually measures as a 2.01% increase below the knee despite consistent compression wraps and clinical MLD. Pt and therapist agree on plan to fit compression garments ASAP to maximize mobility.  (Pended)     Time  12  (Pended)     Period  Weeks  (Pended)     Status  Partially Met  (Pended)       OT LONG TERM GOAL #4   Title  Lymphedema (LE) management/ self-care:  Pt to tolerate daily compression garments and/ or HOS devices in keeping w/ prescribed wear regime within 1 week of issue date to restore normal limb shape to reduce tissue density and protein build up leading to progression.  (Pended)     Baseline  max A 09/03/17: Garments on order. Not yet fit  (Pended)     Time  12  (Pended)     Period  Weeks  (Pended)     Status  Achieved  (Pended)       OT LONG TERM GOAL #5   Title  Lymphedema (LE) Pain:  Pt to report  25% reduction in discomfort/ pain described as LE  "heaviness", "fullness", tightness"" by DC to facilitate improved functional ambulation and mobility and increased independence with basic and instrumental ADLs.   (Pended)     Baseline  max A  (Pended)     Time  12  (Pended)     Period  Weeks  (Pended)     Status  Partially Met  (Pended)       OT LONG TERM GOAL #6   Title  Lymphedema (LE) management/ self-care:  During Management Phase CDT Pt to sustain reduced limb volumes achieved during Intensive Phase CDT during Management Phase CDT within 3% using LE self-care home program components as directed ( simple self MLD, skin care, ther ex and daily/ nightly compression garments/ devices) to limit LE progression and further functional decline.  (Pended)     Baseline  max a  (Pended)     Time  6  (Pended)     Period  Months  (Pended)     Status  On-going  (Pended)             Plan - 10/31/17 1548    Clinical Impression Statement  Remade compression garments fitted and assessed today. Adding oblique top edge much improved garment fit and length. Replaceing 2.5 cm top band w/ 5 cm top band helps keep garments in place. Pt declined demo of Teacher, adult education. Opened circumference at  cy helped to reduce effort for donning. After skilled edu Pt able to use custom doffiong device to remove stockings with left physical effort. Lymphedema is very well managed today. OKd completeion of 2nd pair of garments  . Will fit thos when available,. complete volumetric measurements and LLIS. If Pt remains stable and at current level of independence w/ LE self management will DC skilled OT.    Occupational Profile and client history currently impacting functional performance  Pt states episodes of severe depression may impact participation in OT    Occupational performance deficits (Please refer to evaluation for details):  ADL's;IADL's;Work;Leisure;Social Participation;Other   decreased body  image   Rehab Potential  Good    OT Frequency  3x / week    OT Duration  12 weeks    OT Treatment/Interventions  Self-care/ADL training;Therapeutic exercise;Manual lymph drainage;Coping strategies training;Compression bandaging;Patient/family education;Other (comment);Energy conservation;Therapist, nutritional;Therapeutic activities;DME and/or AE instruction;Manual Therapy    Plan  Fit with knee length BLE compression garments for daytime use and knee length HOS device to limit fibrosis formation and improve lymphatic function during HOS.    Clinical Decision Making  Several treatment options, min-mod task modification necessary    Consulted and Agree with Plan of Care  Patient       Patient will benefit from skilled therapeutic intervention in order to improve the following deficits and impairments:  Abnormal gait, Decreased knowledge of use of DME, Decreased skin integrity, Increased edema, Impaired flexibility, Pain, Decreased mobility, Impaired sensation, Decreased coping skills, Decreased range of motion, Decreased activity tolerance, Decreased knowledge of precautions, Difficulty walking, Obesity  Visit Diagnosis: Lymphedema, not elsewhere classified    Problem List Patient Active Problem List   Diagnosis Date Noted  . Cutaneous skin tags 08/22/2017  . Eating disorder 05/23/2017  . Abdominal pain, right upper quadrant 05/01/2017  . Gallstones 04/19/2017  . Diabetic polyneuropathy associated with type 2 diabetes mellitus ([place]) 04/07/2017  . Diabetic angiopathy ([place]) 04/07/2017  . Vasomotor symptoms due to menopause 04/07/2017  . Mobility impaired 11/12/2016  . Diarrhea 09/05/2016  .  Urinary incontinence 12/02/2015  . Candidal intertrigo 04/06/2015  . Cystocele 04/06/2015  . Constipation 04/06/2015  . Back pain 12/28/2014  . Hypertriglyceridemia 10/14/2014  . Breast mass in female 07/28/2014  . Fall 07/23/2013  . Great toe pain 05/12/2013  . Medication management  11/20/2012  . Abdominal pain 04/23/2012  . Uric acid kidney stone 11/20/2011  . HYPERHIDROSIS 11/07/2009  . Menopausal symptoms 06/17/2009  . Type 2 diabetes mellitus without complication, without long-term current use of insulin ([place]) 08/23/2008  . VITAMIN D DEFICIENCY 05/07/2008  . VITAMIN B12 DEFICIENCY 06/04/2007  . CHRONIC FATIGUE SYNDROME 11/15/2006  . DIVERTICULITIS, HX OF 11/15/2006  . PCOS (polycystic ovarian syndrome) 10/16/2006  . Hyperlipidemia LDL goal <100 10/16/2006  . Morbid obesity with BMI of 50.0-59.9, adult ([place]) 10/16/2006  . Generalized anxiety disorder 10/16/2006  . PANIC DISORDER 10/16/2006  . BULIMIA 10/16/2006  . Chronic depression 10/16/2006  . CARPAL TUNNEL SYNDROME 10/16/2006  . LYMPHEDEMA 10/16/2006  . Allergic rhinitis 10/16/2006  . Mild intermittent asthma 10/16/2006  . TMJ SYNDROME 10/16/2006  . GERD 10/16/2006  . IRRITABLE BOWEL SYNDROME 10/16/2006  . OSTEOARTHRITIS 10/16/2006  . Myalgia and myositis 10/16/2006  . COUGH, CHRONIC 10/16/2006   Andrey Spearman, MS, OTR/L, [facility] 10/31/17 3:54 PM  [place] MAIN [facility] SERVICES [address] [place], [place], [address] Phone: [phone]   Fax:  [phone]  Name: TRINETTA ALEMU MRN: [id number] Date of Birth: 02-Dec-1965

## 2017-11-11 ENCOUNTER — Ambulatory Visit: Payer: Medicare Other | Admitting: Podiatry

## 2017-11-12 ENCOUNTER — Ambulatory Visit: Payer: Medicare Other | Admitting: Podiatry

## 2017-11-12 ENCOUNTER — Encounter: Payer: Self-pay | Admitting: Podiatry

## 2017-11-12 DIAGNOSIS — E1142 Type 2 diabetes mellitus with diabetic polyneuropathy: Secondary | ICD-10-CM

## 2017-11-12 DIAGNOSIS — B351 Tinea unguium: Secondary | ICD-10-CM | POA: Diagnosis not present

## 2017-11-12 DIAGNOSIS — M79676 Pain in unspecified toe(s): Secondary | ICD-10-CM | POA: Diagnosis not present

## 2017-11-12 DIAGNOSIS — I890 Lymphedema, not elsewhere classified: Secondary | ICD-10-CM

## 2017-11-12 NOTE — Progress Notes (Signed)
   SUBJECTIVE Patient with a history of diabetes mellitus presents to office today complaining of elongated, thickened nails that cause pain while ambulating in shoes. She is unable to trim her own nails. Patient is here for further evaluation and treatment. Patient also has chronic bilateral lower extremity edema. She is being treated currently by a lymphedema specialist with medical grade compression hose.    Past Medical History:  Diagnosis Date  . Allergy    allergic rhinitis  . Anemia   . Anxiety   . Arthritis    osteoarthritis  . Asthma   . Claustrophobia    does not like oxygen mask covering face  . Depression   . Diabetes mellitus without complication ([place])   . Fall 2016  . Fatty liver 07/2008   abd. ultrasound - fatty liver ; slt dilated cbd (no stones ) 06/10// abd.  ultrasound normal on 04/2006  . Fibromyalgia   . GERD (gastroesophageal reflux disease)    EGD negative 06/2001// EGD erythematous mucosa, polyp 08/2008  . High uric acid in 24 hour urine specimen   . Hyperhidrosis   . Hyperlipidemia   . Kidney stones   . Lymphedema   . Neuromuscular disorder (HCC)    fibromyalgia  . Obesity   . Shortness of breath dyspnea   . Tachycardia   . TMJ (dislocation of temporomandibular joint)   . UTI (lower urinary tract infection)     OBJECTIVE General Patient is awake, alert, and oriented x 3 and in no acute distress. Derm Skin is dry and supple bilateral. Negative open lesions or macerations. Remaining integument unremarkable. Nails are tender, long, thickened and dystrophic with subungual debris, consistent with onychomycosis, 1-5 bilateral. No signs of infection noted. Vasc  DP and PT pedal pulses palpable bilaterally. Temperature gradient within normal limits. Edema noted to bilateral lower extremities.  Neuro Epicritic and protective threshold sensation diminished bilaterally.  Musculoskeletal Exam No symptomatic pedal deformities noted bilateral. Muscular strength  within normal limits.  ASSESSMENT 1. Diabetes Mellitus w/ peripheral neuropathy 2. Onychomycosis of nail due to dermatophyte bilateral 3. Chronic BLE edema   PLAN OF CARE 1. Patient evaluated today. 2. Instructed to maintain good pedal hygiene and foot care. Stressed importance of controlling blood sugar.  3. Mechanical debridement of nails 1-5 bilaterally performed using a nail nipper. Filed with dremel without incident.  4. Continue care with lymphedema specialist.  5. Patient currently goes to physical therapy weekly. She would like to have a hold put on her physical therapy for one month, then resume. Note provided for patient.  6. Return to clinic in 3 mos.   Goes by [employer].  Edrick Kins, DPM [facility]  Dr. Edrick Kins, [place]                                        [place], [place] [address]                Office [phone]  Fax [phone]

## 2017-11-14 ENCOUNTER — Encounter: Payer: Self-pay | Admitting: Occupational Therapy

## 2017-11-14 ENCOUNTER — Ambulatory Visit: Payer: Medicare Other | Attending: Podiatry | Admitting: Occupational Therapy

## 2017-11-14 DIAGNOSIS — I890 Lymphedema, not elsewhere classified: Secondary | ICD-10-CM | POA: Diagnosis not present

## 2017-11-14 NOTE — Therapy (Signed)
[place] MAIN [facility] SERVICES [address] [place], [place], [address] Phone: [phone]   Fax:  [phone]  Occupational Therapy Treatment Note, Progress Report and Discharge Summary: Lymphedema Care  Patient Details  Name: Mackenzie Bonilla MRN: [id number] Date of Birth: [birth year] No data recorded  Encounter Date: 11/14/2017  OT End of Session - 11/14/17 1043    Visit Number  20    Number of Visits  36    Date for OT Re-Evaluation  10/22/17    Authorization Type  OT DC and Progress reports `10/3/`19    OT Start Time  1012    OT Stop Time  1052    OT Time Calculation (min)  40 min    Activity Tolerance  Patient tolerated treatment well;No increased pain    Behavior During Therapy  WFL for tasks assessed/performed       Past Medical History:  Diagnosis Date  . Allergy    allergic rhinitis  . Anemia   . Anxiety   . Arthritis    osteoarthritis  . Asthma   . Claustrophobia    does not like oxygen mask covering face  . Depression   . Diabetes mellitus without complication ([place])   . Fall 2016  . Fatty liver 07/2008   abd. ultrasound - fatty liver ; slt dilated cbd (no stones ) 06/10// abd.  ultrasound normal on 04/2006  . Fibromyalgia   . GERD (gastroesophageal reflux disease)    EGD negative 06/2001// EGD erythematous mucosa, polyp 08/2008  . High uric acid in 24 hour urine specimen   . Hyperhidrosis   . Hyperlipidemia   . Kidney stones   . Lymphedema   . Neuromuscular disorder (HCC)    fibromyalgia  . Obesity   . Shortness of breath dyspnea   . Tachycardia   . TMJ (dislocation of temporomandibular joint)   . UTI (lower urinary tract infection)     Past Surgical History:  Procedure Laterality Date  . BREAST BIOPSY Right 2016   neg  . BREAST LUMPECTOMY WITH RADIOACTIVE SEED LOCALIZATION Right 08/02/2014   Procedure: BREAST LUMPECTOMY WITH RADIOACTIVE SEED LOCALIZATION;  Surgeon: Rolm Bookbinder, MD;  Location: [place];  Service: General;  Laterality: Right;  . COLONOSCOPY  08/12/2012   Completed by Dr. Phylis Bougie, normal exam.   . ENDOMETRIAL BIOPSY  01/2004  . ESOPHAGOGASTRODUODENOSCOPY    . FOOT SURGERY    . SEPTOPLASTY     two times  . TONSILLECTOMY    . UTERINE FIBROID SURGERY  06/2005   ablation  . uterine tumor  09/2001  . VAGUS NERVE STIMULATOR INSERTION      There were no vitals filed for this visit.  Subjective Assessment - 11/14/17 1057    Subjective   Ms. Buch is here for OT visit 20/36 for final custom compression garment fitting as she transitions to self-management phase of CDT. IPt brings 2nd pair of garments     for fitting and assessment.    Pertinent History  Primary LE dx at age 52 is consistent with LE Praecox, Lymphedema praecox, also known as Meige disease, is the most common form of primary lymphedema. By definition, this disease becomes clinically evident after birth and before age 10 years. L ankle fx 2017, obesity, depression, OA- B knees, chromnic back pain, type II diabetes     Limitations  chronic leg pain and swelling, impaired standing tolerance, difficulty fitting LB clothing and street shoes,  Repetition  Increases Symptoms    Patient Stated Goals  get swelling down and relieve leg pain so I can do more    Pain Onset  Other (comment)   chronic                  OT Treatments/Exercises (OP) - 11/14/17 0001      ADLs   ADL Education Given  Yes             OT Education - 11/14/17 1058    Education Details  Pt edu for garment fitting, donning and doffing using assistivbe devices. Reviewed LE self -care hme program components or optimal long term outcome    Person(s) Educated  Patient    Methods  Explanation;Demonstration;Tactile cues;Verbal cues    Comprehension  Verbalized understanding;Returned demonstration;Tactile cues required;Verbal cues required          OT Long Term Goals - 11/14/17 1101      OT LONG TERM GOAL #1   Title   Pt independent w/ lymphedema precautions/prevention principals and using printed reference to limit LE progression and infection risk.    Baseline  Max A    Time  2    Period  Weeks    Status  Achieved      OT LONG TERM GOAL #2   Title  Lymphedema (LE) management/ self-care: Pt able to apply knee  length gradient compression wraps using correct techniques with modified independence ( extra time and assistive device/s PRN)   within 2 weeks to achieve optimal limb volume reduction during Intensive Phase of CDT and optimal self-management over time.    Baseline  max A  09/03/17: Pt unable to apply compression wraps. Mother assists her during visit intervals    Time  2    Period  Weeks    Status  Achieved      OT LONG TERM GOAL #3   Title  Lymphedema (LE) management/ self-care:  Pt to achieve at least 10%  limb volume reductions bilaterally  during Intensive Phase CDT to limit LE progression, to decrease infection risk and to improve functional performance in all occupational domains.     Baseline  max a  09/03/17: To date LLE limb volume reduction actually measures as a 2.01% increase below the knee despite consistent compression wraps and clinical MLD. Pt and therapist agree on plan to fit compression garments ASAP to maximize mobility.    Time  12    Period  Weeks    Status  Partially Met      OT LONG TERM GOAL #4   Title  Lymphedema (LE) management/ self-care:  Pt to tolerate daily compression garments and/ or HOS devices in keeping w/ prescribed wear regime within 1 week of issue date to restore normal limb shape to reduce tissue density and protein build up leading to progression.    Baseline  max A 09/03/17: Garments on order. Not yet fit    Time  12    Period  Weeks    Status  Achieved      OT LONG TERM GOAL #5   Title  Lymphedema (LE) Pain: Pt to report 25% reduction in discomfort/ pain described as LE  "heaviness", "fullness", tightness"" by DC to facilitate improved functional  ambulation and mobility and increased independence with basic and instrumental ADLs.     Baseline  max A    Time  12    Period  Weeks    Status  Achieved  OT LONG TERM GOAL #6   Title  Lymphedema (LE) management/ self-care:  During Management Phase CDT Pt to sustain reduced limb volumes achieved during Intensive Phase CDT during Management Phase CDT within 3% using LE self-care home program components as directed ( simple self MLD, skin care, ther ex and daily/ nightly compression garments/ devices) to limit LE progression and further functional decline.    Baseline  max a    Time  6    Period  Months    Status  On-going              Patient will benefit from skilled therapeutic intervention in order to improve the following deficits and impairments:     Visit Diagnosis: Lymphedema, not elsewhere classified    Problem List Patient Active Problem List   Diagnosis Date Noted  . Cutaneous skin tags 08/22/2017  . Eating disorder 05/23/2017  . Abdominal pain, right upper quadrant 05/01/2017  . Gallstones 04/19/2017  . Diabetic polyneuropathy associated with type 2 diabetes mellitus ([place]) 04/07/2017  . Diabetic angiopathy ([place]) 04/07/2017  . Vasomotor symptoms due to menopause 04/07/2017  . Mobility impaired 11/12/2016  . Diarrhea 09/05/2016  . Urinary incontinence 12/02/2015  . Candidal intertrigo 04/06/2015  . Cystocele 04/06/2015  . Constipation 04/06/2015  . Back pain 12/28/2014  . Hypertriglyceridemia 10/14/2014  . Breast mass in female 07/28/2014  . Fall 07/23/2013  . Great toe pain 05/12/2013  . Medication management 11/20/2012  . Abdominal pain 04/23/2012  . Uric acid kidney stone 11/20/2011  . HYPERHIDROSIS 11/07/2009  . Menopausal symptoms 06/17/2009  . Type 2 diabetes mellitus without complication, without long-term current use of insulin ([place]) 08/23/2008  . VITAMIN D DEFICIENCY 05/07/2008  . VITAMIN B12 DEFICIENCY 06/04/2007  . CHRONIC FATIGUE  SYNDROME 11/15/2006  . DIVERTICULITIS, HX OF 11/15/2006  . PCOS (polycystic ovarian syndrome) 10/16/2006  . Hyperlipidemia LDL goal <100 10/16/2006  . Morbid obesity with BMI of 50.0-59.9, adult ([place]) 10/16/2006  . Generalized anxiety disorder 10/16/2006  . PANIC DISORDER 10/16/2006  . BULIMIA 10/16/2006  . Chronic depression 10/16/2006  . CARPAL TUNNEL SYNDROME 10/16/2006  . LYMPHEDEMA 10/16/2006  . Allergic rhinitis 10/16/2006  . Mild intermittent asthma 10/16/2006  . TMJ SYNDROME 10/16/2006  . GERD 10/16/2006  . IRRITABLE BOWEL SYNDROME 10/16/2006  . OSTEOARTHRITIS 10/16/2006  . Myalgia and myositis 10/16/2006  . COUGH, CHRONIC 10/16/2006    Andrey Spearman, MS, OTR/L, [facility] 11/14/17 11:19 AM   [place] MAIN [facility] SERVICES [address] [place], [place], [address] Phone: [phone]   Fax:  [phone]  Name: Mackenzie Bonilla MRN: [id number] Date of Birth: 1965/09/01

## 2017-11-27 LAB — HM DIABETES EYE EXAM

## 2017-12-03 ENCOUNTER — Other Ambulatory Visit: Payer: Self-pay | Admitting: Family Medicine

## 2017-12-05 ENCOUNTER — Encounter: Payer: Self-pay | Admitting: Family Medicine

## 2017-12-26 ENCOUNTER — Other Ambulatory Visit: Payer: Self-pay | Admitting: Family Medicine

## 2018-01-21 ENCOUNTER — Encounter: Payer: Self-pay | Admitting: Podiatry

## 2018-01-21 ENCOUNTER — Ambulatory Visit: Payer: Medicare Other | Admitting: Podiatry

## 2018-01-21 DIAGNOSIS — B351 Tinea unguium: Secondary | ICD-10-CM

## 2018-01-21 DIAGNOSIS — M79676 Pain in unspecified toe(s): Secondary | ICD-10-CM

## 2018-01-21 DIAGNOSIS — E1142 Type 2 diabetes mellitus with diabetic polyneuropathy: Secondary | ICD-10-CM

## 2018-01-21 DIAGNOSIS — I890 Lymphedema, not elsewhere classified: Secondary | ICD-10-CM

## 2018-01-21 MED ORDER — GABAPENTIN 100 MG PO CAPS: 100.0000 mg | ORAL_CAPSULE | Freq: Two times a day (BID) | ORAL | 3 refills | Status: DC

## 2018-01-21 MED ORDER — GABAPENTIN 300 MG PO CAPS: 300.0000 mg | ORAL_CAPSULE | Freq: Every day | ORAL | 3 refills | Status: DC

## 2018-01-22 NOTE — Progress Notes (Signed)
   SUBJECTIVE Patient with a history of diabetes mellitus presents to office today complaining of elongated, thickened nails that cause pain while ambulating in shoes. She is unable to trim her own nails. She also has chronic bilateral lower extremity edema. She is being treated currently by a lymphedema specialist with medical grade compression hose. Patient is here for further evaluation and treatment.    Past Medical History:  Diagnosis Date  . Allergy    allergic rhinitis  . Anemia   . Anxiety   . Arthritis    osteoarthritis  . Asthma   . Claustrophobia    does not like oxygen mask covering face  . Depression   . Diabetes mellitus without complication ([place])   . Fall 2016  . Fatty liver 07/2008   abd. ultrasound - fatty liver ; slt dilated cbd (no stones ) 06/10// abd.  ultrasound normal on 04/2006  . Fibromyalgia   . GERD (gastroesophageal reflux disease)    EGD negative 06/2001// EGD erythematous mucosa, polyp 08/2008  . High uric acid in 24 hour urine specimen   . Hyperhidrosis   . Hyperlipidemia   . Kidney stones   . Lymphedema   . Neuromuscular disorder (HCC)    fibromyalgia  . Obesity   . Shortness of breath dyspnea   . Tachycardia   . TMJ (dislocation of temporomandibular joint)   . UTI (lower urinary tract infection)     OBJECTIVE General Patient is awake, alert, and oriented x 3 and in no acute distress. Derm Skin is dry and supple bilateral. Negative open lesions or macerations. Remaining integument unremarkable. Nails are tender, long, thickened and dystrophic with subungual debris, consistent with onychomycosis, 1-5 bilateral. No signs of infection noted. Vasc  DP and PT pedal pulses palpable bilaterally. Temperature gradient within normal limits. Edema noted to bilateral lower extremities.  Neuro Epicritic and protective threshold sensation diminished bilaterally.  Musculoskeletal Exam No symptomatic pedal deformities noted bilateral. Muscular strength  within normal limits.  ASSESSMENT 1. Diabetes Mellitus w/ peripheral neuropathy 2. Onychomycosis of nail due to dermatophyte bilateral 3. Chronic BLE edema   PLAN OF CARE 1. Patient evaluated today. 2. Instructed to maintain good pedal hygiene and foot care. Stressed importance of controlling blood sugar.  3. Mechanical debridement of nails 1-5 bilaterally performed using a nail nipper. Filed with dremel without incident.  4. Continue care with lymphedema specialist.  5. Continue taking Gabapentin 100 mg in the morning, noon and 300 mg every night at bedtime.  6. Continue physical therapy weekly for knee pain and gait instability.  7. Return to clinic in 3 months.    Goes by [employer].  Edrick Kins, DPM [facility]  Dr. Edrick Kins, [place]                                        [place], [place] [address]                Office [phone]  Fax [phone]

## 2018-02-03 ENCOUNTER — Ambulatory Visit: Payer: Medicare Other | Admitting: Podiatry

## 2018-02-06 ENCOUNTER — Other Ambulatory Visit: Payer: Self-pay | Admitting: Family Medicine

## 2018-02-28 ENCOUNTER — Ambulatory Visit: Payer: Self-pay | Admitting: Family Medicine

## 2018-03-21 ENCOUNTER — Ambulatory Visit (INDEPENDENT_AMBULATORY_CARE_PROVIDER_SITE_OTHER): Payer: Medicare Other

## 2018-03-21 ENCOUNTER — Ambulatory Visit: Payer: Medicare Other | Admitting: Podiatry

## 2018-03-21 ENCOUNTER — Other Ambulatory Visit: Payer: Self-pay | Admitting: Podiatry

## 2018-03-21 ENCOUNTER — Encounter: Payer: Self-pay | Admitting: Podiatry

## 2018-03-21 DIAGNOSIS — I890 Lymphedema, not elsewhere classified: Secondary | ICD-10-CM | POA: Diagnosis not present

## 2018-03-21 DIAGNOSIS — M659 Synovitis and tenosynovitis, unspecified: Secondary | ICD-10-CM | POA: Diagnosis not present

## 2018-03-21 DIAGNOSIS — E0843 Diabetes mellitus due to underlying condition with diabetic autonomic (poly)neuropathy: Secondary | ICD-10-CM

## 2018-03-21 DIAGNOSIS — M79673 Pain in unspecified foot: Secondary | ICD-10-CM

## 2018-03-23 NOTE — Progress Notes (Signed)
   SUBJECTIVE 53 year old female presenting today for follow up evaluation of BLE edema, left greater than right. She reports continued pain in the left foot that radiates up to her knee. She was being treated by the lymphedema specialist and has been using compression hose. There are no modifying factors noted. Patient is here for further evaluation and treatment.   Past Medical History:  Diagnosis Date  . Allergy    allergic rhinitis  . Anemia   . Anxiety   . Arthritis    osteoarthritis  . Asthma   . Claustrophobia    does not like oxygen mask covering face  . Depression   . Diabetes mellitus without complication ([place])   . Fall 2016  . Fatty liver 07/2008   abd. ultrasound - fatty liver ; slt dilated cbd (no stones ) 06/10// abd.  ultrasound normal on 04/2006  . Fibromyalgia   . GERD (gastroesophageal reflux disease)    EGD negative 06/2001// EGD erythematous mucosa, polyp 08/2008  . High uric acid in 24 hour urine specimen   . Hyperhidrosis   . Hyperlipidemia   . Kidney stones   . Lymphedema   . Neuromuscular disorder (HCC)    fibromyalgia  . Obesity   . Shortness of breath dyspnea   . Tachycardia   . TMJ (dislocation of temporomandibular joint)   . UTI (lower urinary tract infection)     OBJECTIVE General Patient is awake, alert, and oriented x 3 and in no acute distress. Derm Skin is dry and supple bilateral. Negative open lesions or macerations. Remaining integument unremarkable. Nails are tender, long, thickened and dystrophic with subungual debris, consistent with onychomycosis, 1-5 bilateral. No signs of infection noted. Vasc  DP and PT pedal pulses palpable bilaterally. Temperature gradient within normal limits. Edema noted to bilateral lower extremities.  Neuro Epicritic and protective threshold sensation diminished bilaterally.  Musculoskeletal Exam Pain with palpation noted to the anterior, lateral and medial aspects of the ankles bilaterally. No symptomatic  pedal deformities noted bilateral. Muscular strength within normal limits.  Radiographic Exam:  Normal osseous mineralization. Joint spaces preserved. No fracture/dislocation/boney destruction.    ASSESSMENT 1. Diabetes Mellitus w/ peripheral neuropathy 2. Chronic BLE edema  3. Bilateral ankle synovitis   PLAN OF CARE 1. Patient evaluated today. X-Rays reviewed.  2. Injection of 0.5 mLs Celestone Soluspan injected into the bilateral ankle joints.  3. Prescription for Naproxen 500 mg BID provided to patient.  4. Continue care with lymphedema specialist. Patient cannot currently afford management with specialist.  5. Continue taking Gabapentin 100 mg in the morning, noon and 300 mg every night at bedtime.  6. Continue using compression hose bilaterally.  7. Return to clinic in 3 months.    Goes by [employer].  Edrick Kins, DPM [facility]  Dr. Edrick Kins, [address]                                        [place], [place] [address]                Office [phone]  Fax [phone]

## 2018-03-25 ENCOUNTER — Telehealth: Payer: Self-pay | Admitting: Podiatry

## 2018-03-25 ENCOUNTER — Ambulatory Visit: Payer: Medicare Other | Admitting: Podiatry

## 2018-03-25 MED ORDER — NAPROXEN 500 MG PO TABS: 500.0000 mg | ORAL_TABLET | Freq: Two times a day (BID) | ORAL | 1 refills | Status: DC

## 2018-03-25 NOTE — Telephone Encounter (Signed)
Please call patient she is having a reaction to the injection she has received

## 2018-03-25 NOTE — Telephone Encounter (Signed)
Spoke with patient, she stated that her ankles were hurting and feel weak.  She also stated that she was flushing from the injection.  I informed her that she could be having a steroid flare up and to try and soak her feet in warm epson salt, ice and elevate her feet and if no better by end of week, to call back.  She verbalized understanding.  She also requested to never receive Betamethasone again.

## 2018-03-25 NOTE — Addendum Note (Signed)
Addended by: Graceann Congress D on: 03/25/2018 05:06 PM   Modules accepted: Orders

## 2018-03-27 ENCOUNTER — Other Ambulatory Visit: Payer: Self-pay

## 2018-03-27 DIAGNOSIS — I890 Lymphedema, not elsewhere classified: Secondary | ICD-10-CM

## 2018-03-27 NOTE — Progress Notes (Signed)
Referral placed for Lymphedema clinic

## 2018-04-07 ENCOUNTER — Telehealth: Payer: Self-pay

## 2018-04-07 NOTE — Telephone Encounter (Signed)
Pt notified of Dr. Marliss Coots comments regarding increasing fluids today

## 2018-04-07 NOTE — Telephone Encounter (Signed)
Pt said over weekend started noticing dark urine in early AM or late PM. During the day urine color is medium to light yellow. Pt does not feel like emptying bladder when urinates, pt getting up more during the night to urinate. No frequency of urine, burning or pain upon urination, no fever or abd pain.pt has been constipated and that makes it more difficult for pt to void. Pt scheduled 30' appt to see Dr Glori Bickers 04/08/18 at 12 noon. FYI to Dr Glori Bickers.

## 2018-04-07 NOTE — Telephone Encounter (Signed)
I will see her then  Please encourage her to keep fluid intake up (dark urine can be a sign of dehydration)

## 2018-04-08 ENCOUNTER — Encounter: Payer: Self-pay | Admitting: Podiatry

## 2018-04-08 ENCOUNTER — Ambulatory Visit: Payer: Medicare Other | Admitting: Family Medicine

## 2018-04-08 ENCOUNTER — Ambulatory Visit: Payer: Medicare Other | Admitting: Podiatry

## 2018-04-08 ENCOUNTER — Encounter: Payer: Self-pay | Admitting: Family Medicine

## 2018-04-08 VITALS — BP 124/76 | HR 87 | Temp 98.3°F | Ht 64.0 in | Wt 319.1 lb

## 2018-04-08 DIAGNOSIS — E1151 Type 2 diabetes mellitus with diabetic peripheral angiopathy without gangrene: Secondary | ICD-10-CM

## 2018-04-08 DIAGNOSIS — E0843 Diabetes mellitus due to underlying condition with diabetic autonomic (poly)neuropathy: Secondary | ICD-10-CM

## 2018-04-08 DIAGNOSIS — R339 Retention of urine, unspecified: Secondary | ICD-10-CM | POA: Diagnosis not present

## 2018-04-08 DIAGNOSIS — M79676 Pain in unspecified toe(s): Secondary | ICD-10-CM | POA: Diagnosis not present

## 2018-04-08 DIAGNOSIS — M545 Low back pain: Secondary | ICD-10-CM

## 2018-04-08 DIAGNOSIS — N390 Urinary tract infection, site not specified: Secondary | ICD-10-CM | POA: Insufficient documentation

## 2018-04-08 DIAGNOSIS — N3000 Acute cystitis without hematuria: Secondary | ICD-10-CM

## 2018-04-08 DIAGNOSIS — G8929 Other chronic pain: Secondary | ICD-10-CM

## 2018-04-08 DIAGNOSIS — B351 Tinea unguium: Secondary | ICD-10-CM | POA: Diagnosis not present

## 2018-04-08 DIAGNOSIS — I890 Lymphedema, not elsewhere classified: Secondary | ICD-10-CM

## 2018-04-08 DIAGNOSIS — K582 Mixed irritable bowel syndrome: Secondary | ICD-10-CM | POA: Diagnosis not present

## 2018-04-08 DIAGNOSIS — Z6841 Body Mass Index (BMI) 40.0 and over, adult: Secondary | ICD-10-CM

## 2018-04-08 LAB — POC URINALSYSI DIPSTICK (AUTOMATED)
Bilirubin, UA: 1
Blood, UA: 200
Glucose, UA: NEGATIVE
Ketones, UA: NEGATIVE
Nitrite, UA: POSITIVE
PROTEIN UA: POSITIVE — AB
Spec Grav, UA: 1.025 (ref 1.010–1.025)
UROBILINOGEN UA: 0.2 U/dL
pH, UA: 5.5 (ref 5.0–8.0)

## 2018-04-08 MED ORDER — CYCLOBENZAPRINE HCL 5 MG PO TABS: ORAL_TABLET | ORAL | 3 refills | Status: DC

## 2018-04-08 MED ORDER — SULFAMETHOXAZOLE-TRIMETHOPRIM 800-160 MG PO TABS: 1.0000 | ORAL_TABLET | Freq: Two times a day (BID) | ORAL | 0 refills | Status: DC

## 2018-04-08 NOTE — Assessment & Plan Note (Signed)
Worse with recent stressors  No pain or blood in stool  If no improvement with anxiety treatment will alert [place]

## 2018-04-08 NOTE — Assessment & Plan Note (Signed)
Discussed how this problem influences overall health and the risks it imposes  Reviewed plan for weight loss with lower calorie diet (via better food choices and also portion control or program like weight watchers) and exercise building up to or more than 30 minutes 5 days per week including some aerobic activity   She is working with her counselor re: binging behavior

## 2018-04-08 NOTE — Assessment & Plan Note (Signed)
Pos ua with increase in urinary frequency  Sent for culture  tx with bactrim ds for 5 d  Handout given  Again stressed imp of fluid intake -will work to increase it

## 2018-04-08 NOTE — Patient Instructions (Signed)
Drink lots of fluids (for throat issues you may want to drink more slowly or drink something different)  Get back to ENT if the throat symptoms persist   I refilled flexeril   For uti -I sent in bactrim DS Take as directed  We will culture your urine to confirm this and call you when it returns  Update if not starting to improve in a week or if worsening

## 2018-04-08 NOTE — Assessment & Plan Note (Signed)
Continues to see endocrinology

## 2018-04-08 NOTE — Progress Notes (Signed)
Subjective:    Patient ID: Mackenzie Bonilla, female    DOB: 11/16/1965, 53 y.o.   MRN: [id number]  HPI  Here for dark urine , some urinary retention and frequent urination at night   Ditropan 15 mg xl   Stomach upset from anxiety  Has IBS ? If stool has been darker (but not black) Has urgency after eating  This worsens when she worries  Recently lost some family members   Recently retired  Some financial stressors lately  Her psychiatrist started her on [place] which seems to be helping more   Mental health New psychologist- DR Bary Castilla  (thinks it helps her) also    Wt Readings from Last 3 Encounters:  04/08/18 (!) 319 lb 2 oz (144.8 kg)  09/02/17 (!) 333 lb 9.6 oz (151.3 kg)  08/22/17 (!) 334 lb 8 oz (151.7 kg)   54.78 kg/m   Urinary symptoms started 2 mo ago  Noticed nocturia 4-5 times per night  Then slowed down  Now worse again  Urine has darker (cannot see blood)  Frequency and urgency  Had some burning (warmth) to urinate -that is not as bad now  Had vaginal itching - that went away also  No fever    Does not get enough fluids  Used to get 4-5 bottles (16 oz) of fluid per day  Now she has had throat pain when she drinks water -so taking in less Thinks she may need to get in with ENT Drinks some diet flavored water also    UA is positive   Results for orders placed or performed in visit on 04/08/18  POCT Urinalysis Dipstick (Automated)  Result Value Ref Range   Color, UA Yellow    Clarity, UA Cloudy    Glucose, UA Negative Negative   Bilirubin, UA 1 mg/dL    Ketones, UA Negative    Spec Grav, UA 1.025 1.010 - 1.025   Blood, UA 200 Ery/uL    pH, UA 5.5 5.0 - 8.0   Protein, UA Positive (A) Negative   Urobilinogen, UA 0.2 0.2 or 1.0 E.U./dL   Nitrite, UA Positive    Leukocytes, UA Large (3+) (A) Negative     Lab Results  Component Value Date   CREATININE 1.04 (H) 05/21/2017   BUN 10 05/21/2017   NA 143 05/21/2017   K 5.0 05/21/2017   CL  100 05/21/2017   CO2 26 05/21/2017      Had shots in ankles from podiatrist recently -had a reaction to it  Hot flashes /sweats and ankles swelled  Seeing lymphedema specialist   Patient Active Problem List   Diagnosis Date Noted  . UTI (urinary tract infection) 04/08/2018  . Eating disorder 05/23/2017  . Gallstones 04/19/2017  . Diabetic polyneuropathy associated with type 2 diabetes mellitus ([place]) 04/07/2017  . Diabetic angiopathy ([place]) 04/07/2017  . Vasomotor symptoms due to menopause 04/07/2017  . Mobility impaired 11/12/2016  . Diarrhea 09/05/2016  . Urinary incontinence 12/02/2015  . Candidal intertrigo 04/06/2015  . Cystocele 04/06/2015  . Constipation 04/06/2015  . Back pain 12/28/2014  . Hypertriglyceridemia 10/14/2014  . Breast mass in female 07/28/2014  . Fall 07/23/2013  . Great toe pain 05/12/2013  . Medication management 11/20/2012  . Uric acid kidney stone 11/20/2011  . HYPERHIDROSIS 11/07/2009  . Menopausal symptoms 06/17/2009  . Type 2 diabetes mellitus without complication, without long-term current use of insulin ([place]) 08/23/2008  . VITAMIN D DEFICIENCY 05/07/2008  . VITAMIN B12 DEFICIENCY  06/04/2007  . CHRONIC FATIGUE SYNDROME 11/15/2006  . DIVERTICULITIS, HX OF 11/15/2006  . PCOS (polycystic ovarian syndrome) 10/16/2006  . Hyperlipidemia LDL goal <100 10/16/2006  . Morbid obesity with BMI of 50.0-59.9, adult ([place]) 10/16/2006  . Generalized anxiety disorder 10/16/2006  . PANIC DISORDER 10/16/2006  . BULIMIA 10/16/2006  . Chronic depression 10/16/2006  . CARPAL TUNNEL SYNDROME 10/16/2006  . LYMPHEDEMA 10/16/2006  . Allergic rhinitis 10/16/2006  . Mild intermittent asthma 10/16/2006  . TMJ SYNDROME 10/16/2006  . GERD 10/16/2006  . IRRITABLE BOWEL SYNDROME 10/16/2006  . OSTEOARTHRITIS 10/16/2006  . COUGH, CHRONIC 10/16/2006   Past Medical History:  Diagnosis Date  . Allergy    allergic rhinitis  . Anemia   . Anxiety   . Arthritis     osteoarthritis  . Asthma   . Claustrophobia    does not like oxygen mask covering face  . Depression   . Diabetes mellitus without complication ([place])   . Fall 2016  . Fatty liver 07/2008   abd. ultrasound - fatty liver ; slt dilated cbd (no stones ) 06/10// abd.  ultrasound normal on 04/2006  . Fibromyalgia   . GERD (gastroesophageal reflux disease)    EGD negative 06/2001// EGD erythematous mucosa, polyp 08/2008  . High uric acid in 24 hour urine specimen   . Hyperhidrosis   . Hyperlipidemia   . Kidney stones   . Lymphedema   . Neuromuscular disorder (HCC)    fibromyalgia  . Obesity   . Shortness of breath dyspnea   . Tachycardia   . TMJ (dislocation of temporomandibular joint)   . UTI (lower urinary tract infection)    Past Surgical History:  Procedure Laterality Date  . BREAST BIOPSY Right 2016   neg  . BREAST LUMPECTOMY WITH RADIOACTIVE SEED LOCALIZATION Right 08/02/2014   Procedure: BREAST LUMPECTOMY WITH RADIOACTIVE SEED LOCALIZATION;  Surgeon: Rolm Bookbinder, MD;  Location: [facility];  Service: General;  Laterality: Right;  . COLONOSCOPY  08/12/2012   Completed by Dr. Phylis Bougie, normal exam.   . ENDOMETRIAL BIOPSY  01/2004  . ESOPHAGOGASTRODUODENOSCOPY    . FOOT SURGERY    . SEPTOPLASTY     two times  . TONSILLECTOMY    . UTERINE FIBROID SURGERY  06/2005   ablation  . uterine tumor  09/2001  . VAGUS NERVE STIMULATOR INSERTION     Social History   Tobacco Use  . Smoking status: Never Smoker  . Smokeless tobacco: Never Used  Substance Use Topics  . Alcohol use: No    Alcohol/week: 0.0 standard drinks  . Drug use: No   Family History  Problem Relation Age of Onset  . Hypertension Father   . Cancer Father        pancreatic cancer  . Hypertension Sister   . Heart disease Maternal Grandmother 60       MI  . Diabetes Maternal Grandmother   . Heart disease Paternal Grandmother 63       MI  . Diabetes Paternal Grandmother   . Breast cancer Neg Hx     Allergies  Allergen Reactions  . Cephalexin   . Codeine     REACTION: ? reaction  . Duloxetine   . Levaquin [Levofloxacin] Nausea Only  . Lisinopril Cough  . Metformin And Related Diarrhea    GI side effects   . Prednisone     Tolerates intraarticular depo-medrol.  . Quetiapine Other (See Comments)  . Sertraline Hcl     REACTION: vivid dreams  .  Simvastatin Other (See Comments)    achey  . Triazolam     REACTION: ? reaction  . Zaleplon   . Zocor [Simvastatin - High Dose]     achey  . Zolpidem Tartrate     hallucinations  . Hydrocodone Rash    REACTION: ? reaction REACTION: ? reaction  . Norelgestromin-Eth Estradiol     Patch didn't stick to skin   Current Outpatient Medications on File Prior to Visit  Medication Sig Dispense Refill  . ACCU-CHEK AVIVA PLUS test strip Use to check blood sugar  once a day 100 each 0  . ACCU-CHEK SOFTCLIX LANCETS lancets Use to check blood sugar  once a day 100 each 0  . acetaminophen (TYLENOL) 500 MG tablet Take 500 mg by mouth every 6 (six) hours as needed.     Marland Kitchen albuterol (PROVENTIL HFA;VENTOLIN HFA) 108 (90 Base) MCG/ACT inhaler Inhale 2 puffs into the lungs every 4 (four) hours as needed for wheezing. 1 Inhaler 3  . ALPRAZolam (XANAX) 1 MG tablet Take 2 mg by mouth 3 (three) times daily as needed.     . Blood Glucose Calibration (ACCU-CHEK AVIVA) SOLN     . Cetirizine HCl 10 MG CAPS Take 1 capsule by mouth daily.    Marland Kitchen desvenlafaxine (PRISTIQ) 100 MG 24 hr tablet     . dimenhyDRINATE (DRAMAMINE) 50 MG tablet Take 50 mg by mouth every 8 (eight) hours as needed.    . Ergocalciferol (VITAMIN D2 PO) Take 1 tablet by mouth once a week.     . fluconazole (DIFLUCAN) 150 MG tablet Take 1 tablet (150 mg total) by mouth once a week. 4 tablet 0  . fluticasone (FLONASE ALLERGY RELIEF) 50 MCG/ACT nasal spray Place into both nostrils daily.    Marland Kitchen gabapentin (NEURONTIN) 100 MG capsule Take 1 capsule (100 mg total) by mouth 2 (two) times daily. 360  capsule 3  . gabapentin (NEURONTIN) 300 MG capsule Take 1 capsule (300 mg total) by mouth at bedtime. 90 capsule 3  . glipiZIDE (GLUCOTROL XL) 5 MG 24 hr tablet Take 5 mg by mouth daily with breakfast.    . ibuprofen (ADVIL,MOTRIN) 600 MG tablet Take 1 tablet (600 mg total) by mouth every 8 (eight) hours as needed. 30 tablet 0  . loratadine (CLARITIN) 10 MG tablet Take by mouth.    . losartan (COZAAR) 25 MG tablet     . naproxen (NAPROSYN) 500 MG tablet Take 1 tablet (500 mg total) by mouth 2 (two) times daily with a meal. 60 tablet 1  . omeprazole (PRILOSEC) 20 MG capsule TAKE 1 CAPSULE BY MOUTH TWICE DAILY 180 capsule 1  . oxybutynin (DITROPAN XL) 15 MG 24 hr tablet TAKE 1 TABLET BY MOUTH DAILY 90 tablet 1  . Polyethyl Glycol-Propyl Glycol (SYSTANE OP) Apply 1 drop to eye daily as needed.    . pravastatin (PRAVACHOL) 20 MG tablet TAKE 1 TABLET BY MOUTH NIGHTLY 30 tablet 5  . Simethicone (GAS-X PO) Take by mouth.    . spironolactone (ALDACTONE) 100 MG tablet TAKE 1 TABLET BY MOUTH DAILY 30 tablet 3  . traZODone (DESYREL) 150 MG tablet Take 300 mg by mouth at bedtime.     . TRULICITY 1.5 JX/9.1YN SOPN INJECT CONTENTS OF 1 PEN ONCE A WEEK 2 mL 3  . traMADol (ULTRAM) 50 MG tablet Take 1 tablet (50 mg total) by mouth 2 (two) times daily as needed. (Patient not taking: Reported on 04/08/2018) 30 tablet 0   No  current facility-administered medications on file prior to visit.     Review of Systems  Constitutional: Positive for appetite change and fatigue. Negative for activity change, fever and unexpected weight change.  HENT: Negative for congestion, ear pain, rhinorrhea, sinus pressure and sore throat.        ST on and off when drinking water  Eyes: Negative for pain, redness and visual disturbance.  Respiratory: Negative for cough, shortness of breath and wheezing.   Cardiovascular: Negative for chest pain and palpitations.  Gastrointestinal: Positive for diarrhea. Negative for abdominal pain,  blood in stool and constipation.  Endocrine: Negative for polydipsia and polyuria.  Genitourinary: Positive for dysuria, frequency and urgency. Negative for decreased urine volume, flank pain and vaginal discharge.  Musculoskeletal: Positive for arthralgias and back pain. Negative for myalgias.  Skin: Negative for pallor and rash.  Allergic/Immunologic: Negative for environmental allergies.  Neurological: Negative for dizziness, syncope and headaches.  Hematological: Negative for adenopathy. Does not bruise/bleed easily.  Psychiatric/Behavioral: Negative for decreased concentration and dysphoric mood. The patient is not nervous/anxious.        Objective:   Physical Exam Constitutional:      General: She is not in acute distress.    Appearance: Normal appearance. She is obese.  HENT:     Head: Normocephalic and atraumatic.     Mouth/Throat:     Mouth: Mucous membranes are moist.     Pharynx: Oropharynx is clear.  Eyes:     Conjunctiva/sclera: Conjunctivae normal.     Pupils: Pupils are equal, round, and reactive to light.  Cardiovascular:     Rate and Rhythm: Normal rate and regular rhythm.     Heart sounds: Normal heart sounds.  Pulmonary:     Effort: Pulmonary effort is normal. No respiratory distress.     Breath sounds: Normal breath sounds. No wheezing or rales.  Abdominal:     General: Bowel sounds are normal. There is no distension.     Palpations: Abdomen is soft. There is no mass.     Tenderness: There is no abdominal tenderness.     Comments: No suprapubic tenderness or fullness  No cva tenderness   Musculoskeletal:     Comments: Bilateral LE lymphedema   Skin:    General: Skin is warm and dry.     Coloration: Skin is not jaundiced.     Findings: No rash.  Neurological:     Mental Status: She is alert.  Psychiatric:        Mood and Affect: Mood is anxious and depressed.        Speech: Speech normal.        Behavior: Behavior normal.     Comments: Less anxious  and depressed than usual this visit            Assessment & Plan:   Problem List Items Addressed This Visit      Cardiovascular and Mediastinum   Diabetic angiopathy ([place])    Continues to see endocrinology        Digestive   IRRITABLE BOWEL SYNDROME    Worse with recent stressors  No pain or blood in stool  If no improvement with anxiety treatment will alert [place]          Genitourinary   UTI (urinary tract infection) - Primary    Pos ua with increase in urinary frequency  Sent for culture  tx with bactrim ds for 5 d  Handout given  Again stressed imp of fluid  intake -will work to increase it       Relevant Medications   sulfamethoxazole-trimethoprim (BACTRIM DS,SEPTRA DS) 800-160 MG tablet   Other Relevant Orders   Urine Culture     Other   Morbid obesity with BMI of 50.0-59.9, adult ([place])    Discussed how this problem influences overall health and the risks it imposes  Reviewed plan for weight loss with lower calorie diet (via better food choices and also portion control or program like weight watchers) and exercise building up to or more than 30 minutes 5 days per week including some aerobic activity   She is working with her counselor re: binging behavior       Back pain    Chronic with muscle pain Refilled flexeril today  Warned re: sedation and side eff along with poss interaction with SSRI (has not had problems with it before)       Relevant Medications   cyclobenzaprine (FLEXERIL) 5 MG tablet    Other Visit Diagnoses    Urinary retention       Relevant Orders   POCT Urinalysis Dipstick (Automated) (Completed)

## 2018-04-08 NOTE — Assessment & Plan Note (Signed)
Chronic with muscle pain Refilled flexeril today  Warned re: sedation and side eff along with poss interaction with SSRI (has not had problems with it before)

## 2018-04-09 LAB — URINE CULTURE
MICRO NUMBER: 239729
SPECIMEN QUALITY:: ADEQUATE

## 2018-04-11 NOTE — Telephone Encounter (Addendum)
This morning her urine was really dark again she has drank about 3 bottles of water so now it's back to more of a "medium yellow" but pt said she was advised to let Dr. Glori Bickers know what's going on. Pt said she is drinking as much as she can. Pt said she is still sluggish and still confused, she is getting forgetful and doesn't remember what she did yesterday, she is also very fatigued too. Pt said she doesn't know if she is drinking enough water due to the throat issues/pain she said she told Dr. Glori Bickers about at her OV she ?? If that's due to allergies if not she isn't sure why throat hurts but she has been using Flonase. Pt asked if Dr. Glori Bickers thinks that the abx is causing some of the sxs she is having

## 2018-04-11 NOTE — Progress Notes (Signed)
   SUBJECTIVE Patient with a history of diabetes mellitus presents to office today complaining of elongated, thickened nails that cause pain while ambulating in shoes. She is unable to trim her own nails. Patient is here for further evaluation and treatment.   Past Medical History:  Diagnosis Date  . Allergy    allergic rhinitis  . Anemia   . Anxiety   . Arthritis    osteoarthritis  . Asthma   . Claustrophobia    does not like oxygen mask covering face  . Depression   . Diabetes mellitus without complication ([place])   . Fall 2016  . Fatty liver 07/2008   abd. ultrasound - fatty liver ; slt dilated cbd (no stones ) 06/10// abd.  ultrasound normal on 04/2006  . Fibromyalgia   . GERD (gastroesophageal reflux disease)    EGD negative 06/2001// EGD erythematous mucosa, polyp 08/2008  . High uric acid in 24 hour urine specimen   . Hyperhidrosis   . Hyperlipidemia   . Kidney stones   . Lymphedema   . Neuromuscular disorder (HCC)    fibromyalgia  . Obesity   . Shortness of breath dyspnea   . Tachycardia   . TMJ (dislocation of temporomandibular joint)   . UTI (lower urinary tract infection)     OBJECTIVE General Patient is awake, alert, and oriented x 3 and in no acute distress. Derm Skin is dry and supple bilateral. Negative open lesions or macerations. Remaining integument unremarkable. Nails are tender, long, thickened and dystrophic with subungual debris, consistent with onychomycosis, 1-5 bilateral. No signs of infection noted. Vasc  DP and PT pedal pulses palpable bilaterally. Temperature gradient within normal limits.  Neuro Epicritic and protective threshold sensation diminished bilaterally.  Musculoskeletal Exam No symptomatic pedal deformities noted bilateral. Muscular strength within normal limits.  ASSESSMENT 1. Diabetes Mellitus w/ peripheral neuropathy 2. Onychomycosis of nail due to dermatophyte bilateral 3. Pain in foot bilateral  PLAN OF CARE 1. Patient  evaluated today. 2. Instructed to maintain good pedal hygiene and foot care. Stressed importance of controlling blood sugar.  3. Mechanical debridement of nails 1-5 bilaterally performed using a nail nipper. Filed with dremel without incident.  4. Silicone toe caps dispensed for bilateral great toes.  5. Return to clinic in 3 mos.     Edrick Kins, DPM [facility]  Dr. Edrick Kins, [address]                                        [place], [place] [address]                Office [phone]  Fax [phone]

## 2018-04-11 NOTE — Telephone Encounter (Signed)
I have already talked and addressed this with pt

## 2018-04-11 NOTE — Telephone Encounter (Signed)
Pt notified of Dr. Tower's comments and instructions and verbalized understanding  

## 2018-04-11 NOTE — Telephone Encounter (Signed)
Do continue drinking water -I think concentrated urine is the reason for the color change (or it would not lighten with water intake)  Please update your psychiatrist re: mood and mental status changes - if severely confused do call 911/go to ED (or if suicidal)  In some folks flonase can cause a sore throat- please hold it for a week just to see  If fever or other symptoms let me know  If no further improvement please follow up and we will get some lab work/re eval

## 2018-04-11 NOTE — Telephone Encounter (Signed)
Pt returning your call from yesterday.

## 2018-04-11 NOTE — Telephone Encounter (Signed)
Pt called wanting shapale to call her regarding her symptoms  Best number [phone]

## 2018-04-14 ENCOUNTER — Ambulatory Visit: Payer: Medicare Other | Admitting: Occupational Therapy

## 2018-04-14 ENCOUNTER — Ambulatory Visit: Payer: Medicare Other | Admitting: Family Medicine

## 2018-04-14 ENCOUNTER — Encounter: Payer: Self-pay | Admitting: Family Medicine

## 2018-04-14 VITALS — BP 114/72 | HR 92 | Temp 98.2°F | Ht 64.0 in | Wt 316.2 lb

## 2018-04-14 DIAGNOSIS — Z6841 Body Mass Index (BMI) 40.0 and over, adult: Secondary | ICD-10-CM

## 2018-04-14 DIAGNOSIS — R319 Hematuria, unspecified: Secondary | ICD-10-CM | POA: Diagnosis not present

## 2018-04-14 DIAGNOSIS — N3000 Acute cystitis without hematuria: Secondary | ICD-10-CM

## 2018-04-14 DIAGNOSIS — G8929 Other chronic pain: Secondary | ICD-10-CM

## 2018-04-14 DIAGNOSIS — M545 Low back pain, unspecified: Secondary | ICD-10-CM

## 2018-04-14 LAB — POC URINALSYSI DIPSTICK (AUTOMATED)
Bilirubin, UA: NEGATIVE
Blood, UA: 200
GLUCOSE UA: NEGATIVE
Ketones, UA: NEGATIVE
Nitrite, UA: NEGATIVE
Protein, UA: POSITIVE — AB
Spec Grav, UA: 1.015 (ref 1.010–1.025)
Urobilinogen, UA: 0.2 E.U./dL
pH, UA: 6 (ref 5.0–8.0)

## 2018-04-14 MED ORDER — TRAMADOL HCL 50 MG PO TABS: 50.0000 mg | ORAL_TABLET | Freq: Two times a day (BID) | ORAL | 0 refills | Status: DC | PRN

## 2018-04-14 NOTE — Assessment & Plan Note (Signed)
Some gross blood in urine at home -now resolved Micro again today  cx pending (repeat)- last was contaminated /it is hard for her to give clean catch with obesity Hx of renal stones-watching for change in symptoms Enc her to keep up water intake

## 2018-04-14 NOTE — Patient Instructions (Addendum)
If you develop kidney stone pain - strain urine through the strainer and let us know  I will refill your tramadol  We will culture your urine again   Keep drinking your water   See your chiropractor about back pain   The belly button area is ok -we will watch that

## 2018-04-14 NOTE — Assessment & Plan Note (Signed)
3 lb lost Drinking more water and trying to eat better

## 2018-04-14 NOTE — Assessment & Plan Note (Signed)
Pt had no improvement in symptoms since tx with bactrim  Urine cx showed likely contamination Will re cx today (UA is the same with blood and leukocytes)  She has had an episode of gross blood in urine since then  This resolved (she ? If she passed a stone) Now some R flank discomfort (not bad enough to be a stone) but she is afraid of one Given urine screen Px tramadol for pain prn (she cannot take hydrocodone) Enc good water intake  ucx repeat today-tx if pos  Consider CT or urology (pref female) ref if she develops more pain  Want to re check urine for blood in 2 weeks regardless

## 2018-04-14 NOTE — Assessment & Plan Note (Signed)
This is likely unrelated to urinary symptoms  Some rad to L buttock  She plans to f/u with chirpractor

## 2018-04-14 NOTE — Progress Notes (Signed)
Subjective:    Patient ID: Mackenzie Bonilla, female    DOB: 1965-05-06, 53 y.o.   MRN: [id number]  HPI Here for several issues including urine color change /? Blood  Last urine cx had multiple organisms - poss contamination  Was tx for poss uti  Symptoms did not improve  Wt Readings from Last 3 Encounters:  04/14/18 (!) 316 lb 3 oz (143.4 kg)  04/08/18 (!) 319 lb 2 oz (144.8 kg)  09/02/17 (!) 333 lb 9.6 oz (151.3 kg)   54.27 kg/m     Results for orders placed or performed in visit on 04/14/18  POCT Urinalysis Dipstick (Automated)  Result Value Ref Range   Color, UA Yellow    Clarity, UA Cloudy    Glucose, UA Negative Negative   Bilirubin, UA Negative    Ketones, UA Negative    Spec Grav, UA 1.015 1.010 - 1.025   Blood, UA 200 Ery/uL    pH, UA 6.0 5.0 - 8.0   Protein, UA Positive (A) Negative   Urobilinogen, UA 0.2 0.2 or 1.0 E.U./dL   Nitrite, UA Negative    Leukocytes, UA Large (3+) (A) Negative  similar to last ua   Drinking more water  Urine has looked bright red at times  Sunday am her bladder area hurt a lot -went to the bathroom and thinks she may have passed a kidney stone  After that she did not see blood any more   Pressure and need to urinate all the time   Last urologist was Dr Eliberto Ivory  Would like to see a female   Cannot take hydrocodone -rash/flushed hot    Now has some low back pain and R flank/side  Some nausea  No fever  No vomiting    Has passed kidney stones before but usually more painful (?)- 2017   Concerned about poss umbilical hernia   Also L leg/hip pain   Patient Active Problem List   Diagnosis Date Noted  . UTI (urinary tract infection) 04/08/2018  . Eating disorder 05/23/2017  . Gallstones 04/19/2017  . Diabetic polyneuropathy associated with type 2 diabetes mellitus ([place]) 04/07/2017  . Diabetic angiopathy ([place]) 04/07/2017  . Vasomotor symptoms due to menopause 04/07/2017  . Mobility impaired 11/12/2016  . Urinary  incontinence 12/02/2015  . Candidal intertrigo 04/06/2015  . Cystocele 04/06/2015  . Constipation 04/06/2015  . Low back pain 12/28/2014  . Hypertriglyceridemia 10/14/2014  . Breast mass in female 07/28/2014  . Fall 07/23/2013  . Great toe pain 05/12/2013  . Medication management 11/20/2012  . Uric acid kidney stone 11/20/2011  . Hematuria 08/09/2010  . HYPERHIDROSIS 11/07/2009  . Menopausal symptoms 06/17/2009  . Type 2 diabetes mellitus without complication, without long-term current use of insulin ([place]) 08/23/2008  . VITAMIN D DEFICIENCY 05/07/2008  . VITAMIN B12 DEFICIENCY 06/04/2007  . CHRONIC FATIGUE SYNDROME 11/15/2006  . DIVERTICULITIS, HX OF 11/15/2006  . PCOS (polycystic ovarian syndrome) 10/16/2006  . Hyperlipidemia LDL goal <100 10/16/2006  . Morbid obesity with BMI of 50.0-59.9, adult ([place]) 10/16/2006  . Generalized anxiety disorder 10/16/2006  . PANIC DISORDER 10/16/2006  . BULIMIA 10/16/2006  . Chronic depression 10/16/2006  . CARPAL TUNNEL SYNDROME 10/16/2006  . LYMPHEDEMA 10/16/2006  . Allergic rhinitis 10/16/2006  . Mild intermittent asthma 10/16/2006  . TMJ SYNDROME 10/16/2006  . GERD 10/16/2006  . IRRITABLE BOWEL SYNDROME 10/16/2006  . OSTEOARTHRITIS 10/16/2006  . COUGH, CHRONIC 10/16/2006   Past Medical History:  Diagnosis Date  . Allergy  allergic rhinitis  . Anemia   . Anxiety   . Arthritis    osteoarthritis  . Asthma   . Claustrophobia    does not like oxygen mask covering face  . Depression   . Diabetes mellitus without complication ([place])   . Fall 2016  . Fatty liver 07/2008   abd. ultrasound - fatty liver ; slt dilated cbd (no stones ) 06/10// abd.  ultrasound normal on 04/2006  . Fibromyalgia   . GERD (gastroesophageal reflux disease)    EGD negative 06/2001// EGD erythematous mucosa, polyp 08/2008  . High uric acid in 24 hour urine specimen   . Hyperhidrosis   . Hyperlipidemia   . Kidney stones   . Lymphedema   . Obesity   .  Shortness of breath dyspnea   . Tachycardia   . TMJ (dislocation of temporomandibular joint)   . UTI (lower urinary tract infection)    Past Surgical History:  Procedure Laterality Date  . BREAST BIOPSY Right 2016   neg  . BREAST LUMPECTOMY WITH RADIOACTIVE SEED LOCALIZATION Right 08/02/2014   Procedure: BREAST LUMPECTOMY WITH RADIOACTIVE SEED LOCALIZATION;  Surgeon: Rolm Bookbinder, MD;  Location: [facility];  Service: General;  Laterality: Right;  . COLONOSCOPY  08/12/2012   Completed by Dr. Phylis Bougie, normal exam.   . ENDOMETRIAL BIOPSY  01/2004  . ESOPHAGOGASTRODUODENOSCOPY    . FOOT SURGERY    . SEPTOPLASTY     two times  . TONSILLECTOMY    . UTERINE FIBROID SURGERY  06/2005   ablation  . uterine tumor  09/2001  . VAGUS NERVE STIMULATOR INSERTION     Social History   Tobacco Use  . Smoking status: Never Smoker  . Smokeless tobacco: Never Used  Substance Use Topics  . Alcohol use: No    Alcohol/week: 0.0 standard drinks  . Drug use: No   Family History  Problem Relation Age of Onset  . Hypertension Father   . Cancer Father        pancreatic cancer  . Hypertension Sister   . Heart disease Maternal Grandmother 60       MI  . Diabetes Maternal Grandmother   . Heart disease Paternal Grandmother 59       MI  . Diabetes Paternal Grandmother   . Breast cancer Neg Hx    Allergies  Allergen Reactions  . Cephalexin   . Codeine     REACTION: ? reaction  . Duloxetine   . Levaquin [Levofloxacin] Nausea Only  . Lisinopril Cough  . Metformin And Related Diarrhea    GI side effects   . Prednisone     Tolerates intraarticular depo-medrol.  . Quetiapine Other (See Comments)  . Sertraline Hcl     REACTION: vivid dreams  . Simvastatin Other (See Comments)    achey  . Triazolam     REACTION: ? reaction  . Zaleplon   . Zocor [Simvastatin - High Dose]     achey  . Zolpidem Tartrate     hallucinations  . Hydrocodone Rash    REACTION: ? reaction REACTION: ? reaction  .  Norelgestromin-Eth Estradiol     Patch didn't stick to skin   Current Outpatient Medications on File Prior to Visit  Medication Sig Dispense Refill  . ACCU-CHEK AVIVA PLUS test strip Use to check blood sugar  once a day 100 each 0  . ACCU-CHEK SOFTCLIX LANCETS lancets Use to check blood sugar  once a day 100 each 0  . acetaminophen (  TYLENOL) 500 MG tablet Take 500 mg by mouth every 6 (six) hours as needed.     Marland Kitchen albuterol (PROVENTIL HFA;VENTOLIN HFA) 108 (90 Base) MCG/ACT inhaler Inhale 2 puffs into the lungs every 4 (four) hours as needed for wheezing. 1 Inhaler 3  . ALPRAZolam (XANAX) 1 MG tablet Take 2 mg by mouth 3 (three) times daily as needed.     . Blood Glucose Calibration (ACCU-CHEK AVIVA) SOLN     . Cetirizine HCl 10 MG CAPS Take 1 capsule by mouth daily.    . cyclobenzaprine (FLEXERIL) 5 MG tablet TAKE ONE TABLET THREE TIMES A DAY IF NEEDED FOR MUSCLE SPASM 30 tablet 3  . desvenlafaxine (PRISTIQ) 100 MG 24 hr tablet     . dimenhyDRINATE (DRAMAMINE) 50 MG tablet Take 50 mg by mouth every 8 (eight) hours as needed.    . Ergocalciferol (VITAMIN D2 PO) Take 1 tablet by mouth once a week.     . fluconazole (DIFLUCAN) 150 MG tablet Take 1 tablet (150 mg total) by mouth once a week. 4 tablet 0  . gabapentin (NEURONTIN) 100 MG capsule Take 1 capsule (100 mg total) by mouth 2 (two) times daily. 360 capsule 3  . gabapentin (NEURONTIN) 300 MG capsule Take 1 capsule (300 mg total) by mouth at bedtime. 90 capsule 3  . glipiZIDE (GLUCOTROL XL) 5 MG 24 hr tablet Take 5 mg by mouth daily with breakfast.    . ibuprofen (ADVIL,MOTRIN) 600 MG tablet Take 1 tablet (600 mg total) by mouth every 8 (eight) hours as needed. 30 tablet 0  . loratadine (CLARITIN) 10 MG tablet Take by mouth.    . losartan (COZAAR) 25 MG tablet     . naproxen (NAPROSYN) 500 MG tablet Take 1 tablet (500 mg total) by mouth 2 (two) times daily with a meal. 60 tablet 1  . omeprazole (PRILOSEC) 20 MG capsule TAKE 1 CAPSULE BY MOUTH  TWICE DAILY 180 capsule 1  . oxybutynin (DITROPAN XL) 15 MG 24 hr tablet TAKE 1 TABLET BY MOUTH DAILY 90 tablet 1  . Polyethyl Glycol-Propyl Glycol (SYSTANE OP) Apply 1 drop to eye daily as needed.    . pravastatin (PRAVACHOL) 20 MG tablet TAKE 1 TABLET BY MOUTH NIGHTLY 30 tablet 5  . Simethicone (GAS-X PO) Take by mouth.    . spironolactone (ALDACTONE) 100 MG tablet TAKE 1 TABLET BY MOUTH DAILY 30 tablet 3  . traZODone (DESYREL) 150 MG tablet Take 300 mg by mouth at bedtime.     . TRULICITY 1.5 TK/2.4OX SOPN INJECT CONTENTS OF 1 PEN ONCE A WEEK 2 mL 3   No current facility-administered medications on file prior to visit.     Review of Systems  Constitutional: Negative for activity change, appetite change, fatigue, fever and unexpected weight change.  HENT: Negative for congestion, ear pain, rhinorrhea, sinus pressure and sore throat.   Eyes: Negative for pain, redness and visual disturbance.  Respiratory: Negative for cough, shortness of breath and wheezing.   Cardiovascular: Negative for chest pain and palpitations.  Gastrointestinal: Negative for abdominal pain, blood in stool, constipation and diarrhea.  Endocrine: Negative for polydipsia and polyuria.  Genitourinary: Positive for frequency, hematuria and urgency. Negative for decreased urine volume and vaginal bleeding.  Musculoskeletal: Positive for arthralgias and back pain. Negative for myalgias.  Skin: Negative for pallor and rash.  Allergic/Immunologic: Negative for environmental allergies.  Neurological: Negative for dizziness, syncope and headaches.  Hematological: Negative for adenopathy. Does not bruise/bleed easily.  Psychiatric/Behavioral:  Positive for dysphoric mood. Negative for decreased concentration. The patient is nervous/anxious.        Objective:   Physical Exam Constitutional:      General: She is not in acute distress.    Appearance: Normal appearance. She is well-developed. She is obese. She is not  ill-appearing or diaphoretic.  HENT:     Head: Normocephalic and atraumatic.     Mouth/Throat:     Mouth: Mucous membranes are moist.     Pharynx: Oropharynx is clear.  Eyes:     General: No scleral icterus.    Conjunctiva/sclera: Conjunctivae normal.     Pupils: Pupils are equal, round, and reactive to light.  Neck:     Musculoskeletal: Normal range of motion and neck supple.  Cardiovascular:     Rate and Rhythm: Regular rhythm. Tachycardia present.     Heart sounds: Normal heart sounds.  Pulmonary:     Effort: Pulmonary effort is normal. No respiratory distress.     Breath sounds: Normal breath sounds. No wheezing or rales.  Abdominal:     General: Bowel sounds are normal. There is no distension.     Palpations: Abdomen is soft. There is no mass.     Tenderness: There is abdominal tenderness. There is no rebound.     Comments: No cva tenderness  Mild suprapubic tenderness (also large pannus)  Very small umbilical hernia that is reducible  Musculoskeletal:     Right lower leg: No edema.     Left lower leg: No edema.     Comments: Wearing support hose   Lymphadenopathy:     Cervical: No cervical adenopathy.  Skin:    Findings: No erythema or rash.  Neurological:     Mental Status: She is alert. Mental status is at baseline.  Psychiatric:     Comments: Less anxious today  Pleasant  Not tangential            Assessment & Plan:   Problem List Items Addressed This Visit      Genitourinary   UTI (urinary tract infection)    Pt had no improvement in symptoms since tx with bactrim  Urine cx showed likely contamination Will re cx today (UA is the same with blood and leukocytes)  She has had an episode of gross blood in urine since then  This resolved (she ? If she passed a stone) Now some R flank discomfort (not bad enough to be a stone) but she is afraid of one Given urine screen Px tramadol for pain prn (she cannot take hydrocodone) Enc good water intake  ucx  repeat today-tx if pos  Consider CT or urology (pref female) ref if she develops more pain  Want to re check urine for blood in 2 weeks regardless         Other   Morbid obesity with BMI of 50.0-59.9, adult ([place])    3 lb lost Drinking more water and trying to eat better      Hematuria - Primary    Some gross blood in urine at home -now resolved Micro again today  cx pending (repeat)- last was contaminated /it is hard for her to give clean catch with obesity Hx of renal stones-watching for change in symptoms Enc her to keep up water intake      Relevant Orders   POCT Urinalysis Dipstick (Automated) (Completed)   Urine Culture   Low back pain    This is likely unrelated to urinary symptoms  Some rad to L buttock  She plans to f/u with chirpractor      Relevant Medications   traMADol (ULTRAM) 50 MG tablet

## 2018-04-15 ENCOUNTER — Ambulatory Visit: Payer: Medicare Other | Attending: Podiatry | Admitting: Occupational Therapy

## 2018-04-15 DIAGNOSIS — I890 Lymphedema, not elsewhere classified: Secondary | ICD-10-CM | POA: Insufficient documentation

## 2018-04-15 LAB — URINE CULTURE
MICRO NUMBER:: 263576
Result:: NO GROWTH
SPECIMEN QUALITY: ADEQUATE

## 2018-04-18 ENCOUNTER — Encounter: Payer: Self-pay | Admitting: Occupational Therapy

## 2018-04-18 NOTE — Patient Instructions (Signed)

## 2018-04-18 NOTE — Therapy (Signed)
[place] MAIN [facility] SERVICES [address] [place], [place], [address] Phone: [phone]   Fax:  [phone]  Occupational Therapy Evaluation  Patient Details  Name: Mackenzie Bonilla MRN: [id number] Date of Birth: 04-Jun-1965 Referring Provider (OT): Mackenzie Bonilla, [place]   Encounter Date: 04/15/2018  OT End of Session - 04/18/18 1203    Visit Number  1    Number of Visits  6    Date for OT Re-Evaluation  07/17/18    OT Start Time  0208    OT Stop Time  0300    OT Time Calculation (min)  52 min    Activity Tolerance  Patient tolerated treatment well    Behavior During Therapy  [facility] for tasks assessed/performed       Past Medical History:  Diagnosis Date  . Allergy    allergic rhinitis  . Anemia   . Anxiety   . Arthritis    osteoarthritis  . Asthma   . Claustrophobia    does not like oxygen mask covering face  . Depression   . Diabetes mellitus without complication ([place])   . Fall 2016  . Fatty liver 07/2008   abd. ultrasound - fatty liver ; slt dilated cbd (no stones ) 06/10// abd.  ultrasound normal on 04/2006  . Fibromyalgia   . GERD (gastroesophageal reflux disease)    EGD negative 06/2001// EGD erythematous mucosa, polyp 08/2008  . High uric acid in 24 hour urine specimen   . Hyperhidrosis   . Hyperlipidemia   . Kidney stones   . Lymphedema   . Obesity   . Shortness of breath dyspnea   . Tachycardia   . TMJ (dislocation of temporomandibular joint)   . UTI (lower urinary tract infection)     Past Surgical History:  Procedure Laterality Date  . BREAST BIOPSY Right 2016   neg  . BREAST LUMPECTOMY WITH RADIOACTIVE SEED LOCALIZATION Right 08/02/2014   Procedure: BREAST LUMPECTOMY WITH RADIOACTIVE SEED LOCALIZATION;  Surgeon: Mackenzie Bookbinder, MD;  Location: [facility];  Service: General;  Laterality: Right;  . COLONOSCOPY  08/12/2012   Completed by Dr. Phylis Bougie, normal exam.   . ENDOMETRIAL BIOPSY  01/2004  .  ESOPHAGOGASTRODUODENOSCOPY    . FOOT SURGERY    . SEPTOPLASTY     two times  . TONSILLECTOMY    . UTERINE FIBROID SURGERY  06/2005   ablation  . uterine tumor  09/2001  . VAGUS NERVE STIMULATOR INSERTION      There were no vitals filed for this visit.  Subjective Assessment - 04/18/18 1147    Subjective   [name] returns to OT today w/ c/o increased swelling ansd pain in her legs. She was initially referred to OT for Lymphedema care by Mackenzie Bonilla, DPM,. This visit marks a new episode of care.  and this visit is associated with that initial episode of care. "My mother usually wraps me when needed, but she's not able to do it for me now."    Pertinent History  last OT visit10/04/2017 when Pt transitioned from intensive phase to self-management phase CDT for LE care. She has multiple paires of custom, knee length compression garments and a pair of HOS devices to limit fibrosis. It's unclear how consistent she is with daytime compression.    Limitations  difficulty walking, chronic leg pain and swelling, impaired balance, decreased endurance, decreased hip, knee and ankle ROM,     Repetition  Increases Symptoms    Special  Tests  +Stemmer bilaterally    Patient Stated Goals  keep leg pain and swelling from gettnig worse    Currently in Pain?  Yes   chronic leg pain not rated   Pain Location  Leg    Pain Orientation  Right;Left    Pain Descriptors / Indicators  Tender;Sore;Pressure;Discomfort;Heaviness;Tightness;Restless;Tiring    Pain Type  Chronic pain    Effect of Pain on Daily Activities  chronic pain and leg swelling limits basic and instrumental ADLs, limits productive activities and leisure pursuits, limits social participation and contributes to impaired body image         [place] OT Assessment - 04/18/18 0001      Assessment   Medical Diagnosis  mild, stage II, BLE lymphedema 2/2 CVI, orthopedic trauma and obesity    Referring Provider (OT)  Mackenzie Bonilla, DPM    Prior Therapy  CDT  intensive and management phase support. Custom compression for daytime and HOS.       Precautions   Precaution Comments  BLE lymphedema precautions, cellulitis risk is increased      Restrictions   Weight Bearing Restrictions  No      Balance Screen   Has the patient fallen in the past 6 months  No    Has the patient had a decrease in activity level because of a fear of falling?   No    Is the patient reluctant to leave their home because of a fear of falling?   No      Home  Environment   Lives With  Alone      Prior Function   Level of Independence  Independent with basic ADLs;Independent with household mobility without device;Independent with community mobility without device;Independent with homemaking with ambulation;Independent with gait;Independent with transfers;Needs assistance with ADLs    Vocation  Retired    [employer]  sedentary lifestyle. exercises in PT    Leisure  family      ADL   Lower Body Bathing  --   difficulty reaching feet and distal legs to bathe, perform s   Lower Body Dressing  --   requires assist devices to don and  doff custom knee length    Where Assessd - Lower Body Dressing  --   difficulty fitting preferred street shoes and socks 2/2 LE     IADL   Prior Level of Function Shopping  I    Shopping  Takes care of all shopping needs independently   needs seated breaks as exacerbates le and pain   Prior Level of Function Light Housekeeping  I    Light Housekeeping  Does personal laundry completely;[place] small items, rinses stockings, etc.;Maintains house alone or with occasional assistance   needs seated breaks as exacerbates le and pain   Prior Level of Function Meal Prep  I    Meal Prep  Plans, prepares and serves adequate meals independently;Able to complete simple warm meal prep;Able to complete simple cold meal and snack prep   needs seated breaks as exacerbates le and pain   Prior Level of Function Community Mobility  I     Investment banker, corporate own vehicle      Mobility   Mobility Status  History of falls      Activity Tolerance   Activity Tolerance Comments  leg pain and swelling limits activity tolerance.      Cognition   Overall Cognitive Status  Within Functional Limits for tasks assessed  Observation/Other Assessments   Skin Integrity  dry feet, flaking      Posture/Postural Control   Posture/Postural Control  No significant limitations      Sensation   Light Touch  Appears Intact      Coordination   Gross Motor Movements are Fluid and Coordinated  Yes      ROM / Strength   AROM / PROM / Strength  AROM;PROM;Strength      AROM   Overall AROM   Deficits   hips, legs, ankles, feet     PROM   Overall PROM   Deficits   ankles     Strength   Overall Strength  Deficits    Overall Strength Comments  BLE      Hand Function   Right Hand Grip (lbs)  --   WNL   Left Hand Grip (lbs)  --   WNL      LYMPHEDEMA/ONCOLOGY QUESTIONNAIRE - 04/18/18 1132      Lymphedema Assessments   Lymphedema Assessments  Lower extremities      Right Lower Extremity Lymphedema   Other  RLE limb volume from ankle to knee (A-D) measures 4563.21 ml    Other  BLE comparative limb volumetrics reveal both legs below the knee are reduced in limb volume since last mmeasured on 07/19/17. RLE A-D limb volume is decreased by 17.09%, and LLE is decreased by 12.3%.      Left Lower Extremity Lymphedema   Other  LLE limb volume measures 4840.34 ml             OT Treatments/Exercises (OP) - 04/18/18 0001      Transfers   Transfers  Sit to Stand    Sit to Stand  With upper extremity assist;With armrests      ADLs   LB Dressing  limited by leg swelling and AROM    Bathing  limited by AROM    ADL Education Given  Yes      Manual Therapy   Manual Therapy  Edema management;Compression Bandaging    Edema Management  BLE comparative limb volumetrics.    Compression Bandaging  Max A to don compression  stockings after session due to time constraints                 OT Long Term Goals - 04/18/18 1204      OT LONG TERM GOAL #1   Title  Pt to remain > 85% compliant with all LE self care protocols ( simple self MLD, compression, ther ex, skin care) for   next 6 months to limit LE progression.    Baseline  supervision    Time  3    Period  Months    Status  New    Target Date  07/17/18      OT LONG TERM GOAL #2   Title  Pt to retain clinical gains in BLE limb volume reduction made during previous Intensive Phase CDT course by demonstrating no more than 3% limb volume increase in 3 months.    Baseline  supervision    Time  3    Period  Months    Status  New    Target Date  07/17/18            Plan - 04/18/18 1135    Clinical Impression Statement  BLE comparative limb volumetrics reveal both legs below the knee are reduced in limb volume since last mmeasured on 07/19/17. RLEAt present BLE lymphedema is  well controlled .  A-D limb volume is decreased by 17.09%, and LLE is decreased by 12.3%. These values not only meet long term , 6 month , self-management goal for volumetrics, they excede them significantly. Existing custom compression garments are in good condition and do not need to replaced at present. HOS device is not available for assessment. Skin is in good condition except for increased dryness at feet. Pt enocuraged to increase frequency of skin care. Pt agrees that at present return to OT for CDT is not medically necessary. Increased discomfort in legs is not atypical when a person significantly decreases lymphatic congestion and structures are not embedded in increased tissue density. Pt is encouraged to call with questions and concerns anytime, and to return to OT when compression garments need replacement in a few months. This Pt remains appropriate for OT follow along support for lymphedema care  to limit progression and further functional decline.     OT Occupational  Profile and History  Detailed Assessment- Review of Records and additional review of physical, cognitive, psychosocial history related to current functional performance    Occupational performance deficits (Please refer to evaluation for details):  ADL's;Work;IADL's;Leisure;Rest and Sleep;Social Participation;Other   body image, chronic pain   Body Structure / Function / Physical Skills  ADL;IADL;Edema;Decreased knowledge of precautions;Pain;Skin integrity    Psychosocial Skills  Coping Strategies;Habits;Environmental  Adaptations;Routines and Behaviors    Rehab Potential  Good    Comorbidities Affecting Occupational Performance:  May have comorbidities impacting occupational performance    Modification or Assistance to Complete Evaluation   Min-Moderate modification of tasks or assist with assess necessary to complete eval    OT Frequency  Other (comment)   follow along OT support PRN for compression garment replacement/ fitting and volumetric monitoring-PRN   OT Treatment/Interventions  Self-care/ADL training;Therapeutic exercise;Energy conservation;Manual lymph drainage;Therapeutic activities;Coping strategies training;Patient/family education;Compression bandaging;Manual Therapy;DME and/or AE instruction    Plan  Replace custom knee length compression garments in ~3 months       Patient will benefit from skilled therapeutic intervention in order to improve the following deficits and impairments:  Body Structure / Function / Physical Skills, Psychosocial Skills  Visit Diagnosis: Lymphedema, not elsewhere classified - Plan: Ot plan of care cert/re-cert    Problem List Patient Active Problem List   Diagnosis Date Noted  . UTI (urinary tract infection) 04/08/2018  . Eating disorder 05/23/2017  . Gallstones 04/19/2017  . Diabetic polyneuropathy associated with type 2 diabetes mellitus ([place]) 04/07/2017  . Diabetic angiopathy ([place]) 04/07/2017  . Vasomotor symptoms due to menopause  04/07/2017  . Mobility impaired 11/12/2016  . Urinary incontinence 12/02/2015  . Candidal intertrigo 04/06/2015  . Cystocele 04/06/2015  . Constipation 04/06/2015  . Low back pain 12/28/2014  . Hypertriglyceridemia 10/14/2014  . Breast mass in female 07/28/2014  . Fall 07/23/2013  . Great toe pain 05/12/2013  . Medication management 11/20/2012  . Uric acid kidney stone 11/20/2011  . Hematuria 08/09/2010  . HYPERHIDROSIS 11/07/2009  . Menopausal symptoms 06/17/2009  . Type 2 diabetes mellitus without complication, without long-term current use of insulin ([place]) 08/23/2008  . VITAMIN D DEFICIENCY 05/07/2008  . VITAMIN B12 DEFICIENCY 06/04/2007  . CHRONIC FATIGUE SYNDROME 11/15/2006  . DIVERTICULITIS, HX OF 11/15/2006  . PCOS (polycystic ovarian syndrome) 10/16/2006  . Hyperlipidemia LDL goal <100 10/16/2006  . Morbid obesity with BMI of 50.0-59.9, adult ([place]) 10/16/2006  . Generalized anxiety disorder 10/16/2006  . PANIC DISORDER 10/16/2006  . BULIMIA 10/16/2006  . Chronic  depression 10/16/2006  . CARPAL TUNNEL SYNDROME 10/16/2006  . LYMPHEDEMA 10/16/2006  . Allergic rhinitis 10/16/2006  . Mild intermittent asthma 10/16/2006  . TMJ SYNDROME 10/16/2006  . GERD 10/16/2006  . IRRITABLE BOWEL SYNDROME 10/16/2006  . OSTEOARTHRITIS 10/16/2006  . COUGH, CHRONIC 10/16/2006   Andrey Spearman, MS, OTR/L, [facility] 04/18/18 12:10 PM   [place] MAIN [facility] SERVICES [address] [place], [place], [address] Phone: [phone]   Fax:  [phone]  Name: Mackenzie Bonilla MRN: [id number] Date of Birth: [birth year]

## 2018-04-22 ENCOUNTER — Encounter: Payer: Self-pay | Admitting: Occupational Therapy

## 2018-04-22 ENCOUNTER — Ambulatory Visit: Payer: Self-pay | Admitting: Occupational Therapy

## 2018-04-24 ENCOUNTER — Encounter: Payer: Self-pay | Admitting: Occupational Therapy

## 2018-04-25 ENCOUNTER — Other Ambulatory Visit: Payer: Self-pay | Admitting: Family Medicine

## 2018-04-29 ENCOUNTER — Encounter: Payer: Self-pay | Admitting: Occupational Therapy

## 2018-04-29 ENCOUNTER — Telehealth: Payer: Self-pay | Admitting: Family Medicine

## 2018-04-29 NOTE — Telephone Encounter (Signed)
Pt notified of Dr. Marliss Coots comments. Pt will let us know if she wants urology referral in the future. Med list updated

## 2018-04-29 NOTE — Telephone Encounter (Signed)
Pt stated she won't be coming to drop off urine sample and her back is still hurting.  Pt stated her gabapentin was changed to  300 mg 4x a day

## 2018-04-29 NOTE — Telephone Encounter (Signed)
I had originally suggested a urology referral if urinary symptoms return- but of course we want to avoid more doctor visits unless abs necessary Please keep fluid intake up  Please change med list to note the gabapentin  Thanks

## 2018-05-01 ENCOUNTER — Encounter: Payer: Self-pay | Admitting: Occupational Therapy

## 2018-05-06 ENCOUNTER — Encounter: Payer: Self-pay | Admitting: Occupational Therapy

## 2018-05-08 ENCOUNTER — Encounter: Payer: Self-pay | Admitting: Occupational Therapy

## 2018-05-13 ENCOUNTER — Encounter: Payer: Self-pay | Admitting: Occupational Therapy

## 2018-05-15 ENCOUNTER — Encounter: Payer: Self-pay | Admitting: Occupational Therapy

## 2018-05-20 ENCOUNTER — Encounter: Payer: Self-pay | Admitting: Occupational Therapy

## 2018-05-22 ENCOUNTER — Encounter: Payer: Self-pay | Admitting: Occupational Therapy

## 2018-05-23 ENCOUNTER — Ambulatory Visit: Payer: Medicare Other | Admitting: Podiatry

## 2018-05-27 ENCOUNTER — Encounter: Payer: Self-pay | Admitting: Occupational Therapy

## 2018-05-29 ENCOUNTER — Encounter: Payer: Self-pay | Admitting: Occupational Therapy

## 2018-06-03 ENCOUNTER — Encounter: Payer: Self-pay | Admitting: Occupational Therapy

## 2018-06-05 ENCOUNTER — Encounter: Payer: Self-pay | Admitting: Occupational Therapy

## 2018-06-10 ENCOUNTER — Ambulatory Visit: Payer: Medicare Other | Admitting: Podiatry

## 2018-06-10 ENCOUNTER — Encounter: Payer: Self-pay | Admitting: Occupational Therapy

## 2018-06-12 ENCOUNTER — Encounter: Payer: Self-pay | Admitting: Occupational Therapy

## 2018-06-13 ENCOUNTER — Encounter: Payer: Self-pay | Admitting: Podiatry

## 2018-06-13 ENCOUNTER — Ambulatory Visit: Payer: Medicare Other | Admitting: Podiatry

## 2018-06-13 ENCOUNTER — Other Ambulatory Visit: Payer: Self-pay

## 2018-06-13 VITALS — Temp 96.4°F

## 2018-06-13 DIAGNOSIS — M79676 Pain in unspecified toe(s): Secondary | ICD-10-CM | POA: Diagnosis not present

## 2018-06-13 DIAGNOSIS — B351 Tinea unguium: Secondary | ICD-10-CM | POA: Diagnosis not present

## 2018-06-13 DIAGNOSIS — E0843 Diabetes mellitus due to underlying condition with diabetic autonomic (poly)neuropathy: Secondary | ICD-10-CM

## 2018-06-13 DIAGNOSIS — I890 Lymphedema, not elsewhere classified: Secondary | ICD-10-CM

## 2018-06-13 NOTE — Progress Notes (Signed)
   SUBJECTIVE Patient with a history of diabetes mellitus presents to office today complaining of elongated, thickened nails that cause pain while ambulating in shoes. She is unable to trim her own nails. Patient is here for further evaluation and treatment.   Past Medical History:  Diagnosis Date  . Allergy    allergic rhinitis  . Anemia   . Anxiety   . Arthritis    osteoarthritis  . Asthma   . Claustrophobia    does not like oxygen mask covering face  . Depression   . Diabetes mellitus without complication ([place])   . Fall 2016  . Fatty liver 07/2008   abd. ultrasound - fatty liver ; slt dilated cbd (no stones ) 06/10// abd.  ultrasound normal on 04/2006  . Fibromyalgia   . GERD (gastroesophageal reflux disease)    EGD negative 06/2001// EGD erythematous mucosa, polyp 08/2008  . High uric acid in 24 hour urine specimen   . Hyperhidrosis   . Hyperlipidemia   . Kidney stones   . Lymphedema   . Obesity   . Shortness of breath dyspnea   . Tachycardia   . TMJ (dislocation of temporomandibular joint)   . UTI (lower urinary tract infection)     OBJECTIVE General Patient is awake, alert, and oriented x 3 and in no acute distress. Derm Skin is dry and supple bilateral. Negative open lesions or macerations. Remaining integument unremarkable. Nails are tender, long, thickened and dystrophic with subungual debris, consistent with onychomycosis, 1-5 bilateral. No signs of infection noted. Vasc  DP and PT pedal pulses palpable bilaterally. Temperature gradient within normal limits.  Neuro Epicritic and protective threshold sensation diminished bilaterally.  Musculoskeletal Exam No symptomatic pedal deformities noted bilateral. Muscular strength within normal limits.  ASSESSMENT 1. Diabetes Mellitus w/ peripheral neuropathy 2. Onychomycosis of nail due to dermatophyte bilateral 3. Pain in foot bilateral  PLAN OF CARE 1. Patient evaluated today. 2. Instructed to maintain good pedal  hygiene and foot care. Stressed importance of controlling blood sugar.  3. Mechanical debridement of nails 1-5 bilaterally performed using a nail nipper. Filed with dremel without incident.  4. Silicone toe caps dispensed for bilateral great toes.  5. Return to clinic in 3 mos.     Edrick Kins, DPM [facility]  Dr. Edrick Kins, [place]                                        [place], [place] [address]                Office [phone]  Fax [phone]

## 2018-06-24 ENCOUNTER — Other Ambulatory Visit: Payer: Self-pay | Admitting: *Deleted

## 2018-06-24 MED ORDER — ALBUTEROL SULFATE HFA 108 (90 BASE) MCG/ACT IN AERS: 2.0000 | INHALATION_SPRAY | Freq: Four times a day (QID) | RESPIRATORY_TRACT | 1 refills | Status: DC | PRN

## 2018-07-08 ENCOUNTER — Telehealth: Payer: Self-pay

## 2018-07-08 ENCOUNTER — Other Ambulatory Visit: Payer: Self-pay | Admitting: Family Medicine

## 2018-07-08 NOTE — Telephone Encounter (Signed)
[place] Patient Name: Alexsandria Nader Gender: Female DOB: 02-04-1966 Age: 53 Y 11 M 16 D Return Phone Number: [phone] (Primary) Address: City/State/Zip: [place] [address] Client [facility] Night - Client Client Site [place] Physician [place], Roque Lias - MD Contact Type Call Who Is Calling Patient / Member / Family / Caregiver Call Type Triage / Clinical Relationship To Patient Self Return Phone Number [phone] (Primary) Chief Complaint Hearing Loss Reason for Call Symptomatic / Request for [place] states her Rt ear had a lot of wax in it. Pt got some out of out but now pt can't hear out of her RT ear. Recommendations? Pressure not much pain. Translation No Nurse Assessment Nurse: Aris Lot, RN, [place] Date/Time (Eastern Time): 07/05/2018 2:14:36 PM Confirm and document reason for call. If symptomatic, describe symptoms. ---Caller states that she has large amount of wax and pressure from right ear with small pain and has lost hearing . Has the patient had close contact with a person known or suspected to have the novel coronavirus illness OR traveled / lives in area with major community spread (including international travel) in the last 14 days from the onset of symptoms? * If Asymptomatic, screen for exposure and travel within the last 14 days. ---No Does the patient have any new or worsening symptoms? ---Yes Will a triage be completed? ---Yes Related visit to physician within the last 2 weeks? ---No Does the PT have any chronic conditions? (i.e. diabetes, asthma, this includes High risk factors for pregnancy, etc.) ---Yes List chronic conditions. ---diabetes,lymphedema, anxiety, depression Is the patient pregnant or possibly pregnant? (Ask all females between the ages of  73-55) ---No Is this a behavioral health or substance abuse call? ---No Guidelines Guideline Title Affirmed Question Affirmed Notes Nurse Date/Time (Eastern Time) Earwax Complete hearing loss in either ear Aris Lot, RN, Margreta Journey 07/05/2018 2:16:46 PM Disp. Time Eilene Ghazi Time) Disposition Final User PLEASE NOTE: All timestamps contained within this report are represented as [place] Standard Time. CONFIDENTIALTY NOTICE: This fax transmission is intended only for the addressee. It contains information that is legally privileged, confidential or otherwise protected from use or disclosure. If you are not the intended recipient, you are strictly prohibited from reviewing, disclosing, copying using or disseminating any of this information or taking any action in reliance on or regarding this information. If you have received this fax in error, please notify us immediately by telephone so that we can arrange for its return to [place]. Phone: [phone], Toll-Free: [phone], Fax: [phone] Page: 2 of 2 Call Id: [id number] 07/05/2018 2:20:58 PM SEE PCP WITHIN 3 DAYS Yes Aris Lot, RN, Margreta Journey Caller Disagree/Comply Comply Caller Understands Yes PreDisposition Go to ED Care Advice Given Per Guideline SEE PCP WITHIN 3 DAYS: * IF PATIENT HAS NO PCP (PRIMARY CARE PROVIDER): A clinic or urgent care center are good places to go for care if you do not have a primary care provider. NOTE: Try to help caller find a PCP for future care (e.g., use a physician referral line). Having a PCP or 'medical home' means better long-term care. EARWAX: * Earwax (cerumen) protects the lining of the ear canal because it has germ-killing properties. Wax is also a natural water-proofing agent. * If all of the ear wax is removed (e.g., with Q-tips), the ear canals become itchy and prone to Swimmer's Ear. DO NOT try to dig the  wax out with hairpins, toothpicks, or other objects (Reason: can cause infection). DO NOT use cotton  applicators (e.g., Q-Tips, cotton buds) (Reason: can push earwax in deeper). CALL BACK IF: * Earache occurs * You become worse. CARE ADVICE given per Earwax (Adult) guideline. Referrals REFERRED TO PCP OFFICE REFERRED TO PCP OFFICE

## 2018-07-08 NOTE — Telephone Encounter (Signed)
That can certainly be from hormonal change -some people flush more than others with hot flashes.  She should ask her gyn about hormone testing.  It is not generally needed but depends on her menstrual status

## 2018-07-08 NOTE — Telephone Encounter (Signed)
Pt notified of Dr. Tower's comments and verbalized understanding  

## 2018-07-08 NOTE — Telephone Encounter (Signed)
Patient states she got in with ent today and they took care of her ear problem.  She does have a question for Dr. Glori Bickers though:  Patient is stating that she is having a significant problem with what she feels may be hot flashes.Intense feelings of heat and redness that is visible to her body and face when episodes hit.    She wants to know if Dr. Glori Bickers thinks this could be hormonal and if so, if she should have her hormones tested?  I did refer her to discuss with her Gynecologist, Dr. Marcelline Mates, but she would like Dr. Alba Cory opinion as well.

## 2018-07-11 ENCOUNTER — Telehealth: Payer: Self-pay | Admitting: Family Medicine

## 2018-07-11 NOTE — Telephone Encounter (Signed)
FYI Pt called today stating she received a bill from [facility] and they blamed [place] for having wrong address.  I confirmed the address we had which pt stated was correct.  Then pt started crying.  I offered to have her speak to [place] pt declined stating she would be ok.  I also offered pt a virtual appointment with Dr Glori Bickers today @ 4:15 pt declined.  She stated she miss her counseling appointment due to her ear hurting she would call and get that r/s.   She stated she would be alright and she was going to stay in and be by herself and starting crying again.

## 2018-07-11 NOTE — Telephone Encounter (Signed)
Aware-she sees psychiatry and a counselor  She struggles if she misses an appointment

## 2018-08-19 ENCOUNTER — Encounter: Payer: Self-pay | Admitting: Podiatry

## 2018-08-19 ENCOUNTER — Ambulatory Visit (INDEPENDENT_AMBULATORY_CARE_PROVIDER_SITE_OTHER): Payer: Medicare Other | Admitting: Podiatry

## 2018-08-19 ENCOUNTER — Other Ambulatory Visit: Payer: Self-pay

## 2018-08-19 VITALS — Temp 98.7°F

## 2018-08-19 DIAGNOSIS — I890 Lymphedema, not elsewhere classified: Secondary | ICD-10-CM

## 2018-08-19 DIAGNOSIS — M79676 Pain in unspecified toe(s): Secondary | ICD-10-CM | POA: Diagnosis not present

## 2018-08-19 DIAGNOSIS — B351 Tinea unguium: Secondary | ICD-10-CM | POA: Diagnosis not present

## 2018-08-19 DIAGNOSIS — E0843 Diabetes mellitus due to underlying condition with diabetic autonomic (poly)neuropathy: Secondary | ICD-10-CM | POA: Diagnosis not present

## 2018-08-21 NOTE — Progress Notes (Signed)
   SUBJECTIVE Patient with a history of diabetes mellitus presents to office today complaining of elongated, thickened nails that cause pain while ambulating in shoes. She is unable to trim her own nails. Patient is here for further evaluation and treatment.   Past Medical History:  Diagnosis Date  . Allergy    allergic rhinitis  . Anemia   . Anxiety   . Arthritis    osteoarthritis  . Asthma   . Claustrophobia    does not like oxygen mask covering face  . Depression   . Diabetes mellitus without complication ([place])   . Fall 2016  . Fatty liver 07/2008   abd. ultrasound - fatty liver ; slt dilated cbd (no stones ) 06/10// abd.  ultrasound normal on 04/2006  . Fibromyalgia   . GERD (gastroesophageal reflux disease)    EGD negative 06/2001// EGD erythematous mucosa, polyp 08/2008  . High uric acid in 24 hour urine specimen   . Hyperhidrosis   . Hyperlipidemia   . Kidney stones   . Lymphedema   . Obesity   . Shortness of breath dyspnea   . Tachycardia   . TMJ (dislocation of temporomandibular joint)   . UTI (lower urinary tract infection)     OBJECTIVE General Patient is awake, alert, and oriented x 3 and in no acute distress. Derm Skin is dry and supple bilateral. Negative open lesions or macerations. Remaining integument unremarkable. Nails are tender, long, thickened and dystrophic with subungual debris, consistent with onychomycosis, 1-5 bilateral. No signs of infection noted. Vasc  DP and PT pedal pulses palpable bilaterally. Temperature gradient within normal limits.  Neuro Epicritic and protective threshold sensation diminished bilaterally.  Musculoskeletal Exam No symptomatic pedal deformities noted bilateral. Muscular strength within normal limits.  ASSESSMENT 1. Diabetes Mellitus w/ peripheral neuropathy 2. Onychomycosis of nail due to dermatophyte bilateral 3. Pain in foot bilateral  PLAN OF CARE 1. Patient evaluated today. 2. Instructed to maintain good pedal  hygiene and foot care. Stressed importance of controlling blood sugar.  3. Mechanical debridement of nails 1-5 bilaterally performed using a nail nipper. Filed with dremel without incident.  4. Return to clinic in 3 mos.     Edrick Kins, DPM [facility]  Dr. Edrick Kins, [address]                                        [place], [place] [address]                Office [phone]  Fax [phone]

## 2018-09-02 ENCOUNTER — Other Ambulatory Visit: Payer: Self-pay | Admitting: Family Medicine

## 2018-09-08 ENCOUNTER — Telehealth: Payer: Self-pay

## 2018-09-08 ENCOUNTER — Other Ambulatory Visit: Payer: Self-pay | Admitting: Family Medicine

## 2018-09-08 MED ORDER — FLUCONAZOLE 150 MG PO TABS: ORAL_TABLET | ORAL | 0 refills | Status: DC

## 2018-09-08 MED ORDER — KETOCONAZOLE 2 % EX CREA: 1.0000 "application " | TOPICAL_CREAM | Freq: Every day | CUTANEOUS | 1 refills | Status: DC | PRN

## 2018-09-08 MED ORDER — OXYBUTYNIN CHLORIDE ER 15 MG PO TB24: 15.0000 mg | ORAL_TABLET | Freq: Every day | ORAL | 1 refills | Status: DC

## 2018-09-08 NOTE — Telephone Encounter (Signed)
done

## 2018-09-08 NOTE — Telephone Encounter (Signed)
Please advised on meds, last OV was 04/14/18 for ?UTI

## 2018-09-08 NOTE — Telephone Encounter (Signed)
Pt notified of Dr. Marliss Coots comments. Pt said that she had been using zeasorb powder for over a week and it wasn't helping, so the pharmacist recommended miconazole cream, pt has been using that for over a week and she hasn't seen any improvement. Pt said that she also had a little bleeding in that area only once and after that she hasn't seen anymore. Pt also thought she had a UTI due to lower back pain but she said the pharmacist didn't think she did and told her to try and treat the yeast inf rash and she should start feeling better. Pt said she has increased her water intake but had asked if the med would take care of a UTI also. I explained to pt that UTI and yeast are treated with different meds. Pt said that she doesn't think she has a UTI and doesn't want an appt to address that now. Pt advised if UTI sxs worsen or don't improve the she should call back and schedule an appt.  Pt did have another question for Dr. Glori Bickers, 1st she question if she is going to send any stronger creams in to help and also Dr. Glori Bickers mentioned to keep the area dry, pt wanted to know how should she do that she said she has a drive through baby shower to go to that's going to be outside in 90 degree weather and she didn't know if she should use something like the abdominal pads she had used before or if Dr. Glori Bickers has any recommendations on how to keep the area dry she said she is having a hard time keeping area dry even with being in the house most of the time

## 2018-09-08 NOTE — Telephone Encounter (Signed)
Pt said she has a yeast infection on upper thighs, and under stomach fold.pt has been alternating antifungal powder and vaginal cream which are not helping.pt is requesting abx. The area under stomach and upper thighs are red,irritated,itching and hurting. Pt request cb. Total care.

## 2018-09-08 NOTE — Telephone Encounter (Signed)
I will send in ketoconazole cream which she has had before  Pads/gauze or cotton clothing are all good options Stay out of heat when able   Let me know in about a week if no improvement

## 2018-09-08 NOTE — Telephone Encounter (Signed)
Please refill ditropan  I already sent diflucan earlier today  Thanks

## 2018-09-08 NOTE — Telephone Encounter (Signed)
Pt notified of Dr. Tower's instructions and verbalized understanding  

## 2018-09-08 NOTE — Telephone Encounter (Signed)
I sent diflucan to take orally 1 pill every 3 d for three doses   What cream is she using?   Keep areas as clean and dry as possible (she has had it before)

## 2018-09-11 ENCOUNTER — Other Ambulatory Visit: Payer: Self-pay | Admitting: Family Medicine

## 2018-09-11 NOTE — Telephone Encounter (Signed)
Finish the diflucan as planned /use the cream  Wear clothing that does not rub and keep area clean and dry as possible F/u if no improvement

## 2018-09-11 NOTE — Telephone Encounter (Signed)
Patient is requesting a call back.   She stated that on the ridge on her stomach and thigh is still very red. Patient stated that the middle is a light pink. She notice when using the restroom that it is very hot to the touch. Patient has been wearing nightgowns all week around the house and isnt sure if maybe it was irritated from her wearing pants the other day   Patient stated she has 1 day left of medication that she will take on Saturday   Phone- [phone]

## 2018-09-12 NOTE — Telephone Encounter (Signed)
Pt notified of Dr. Tower's comments and verbalized understanding  

## 2018-09-17 ENCOUNTER — Encounter: Payer: Self-pay | Admitting: Family Medicine

## 2018-09-17 ENCOUNTER — Other Ambulatory Visit: Payer: Self-pay

## 2018-09-17 ENCOUNTER — Ambulatory Visit (INDEPENDENT_AMBULATORY_CARE_PROVIDER_SITE_OTHER): Payer: Medicare Other | Admitting: Family Medicine

## 2018-09-17 VITALS — BP 100/60 | HR 96 | Temp 99.4°F | Ht 64.0 in | Wt 336.5 lb

## 2018-09-17 DIAGNOSIS — M1712 Unilateral primary osteoarthritis, left knee: Secondary | ICD-10-CM

## 2018-09-17 MED ORDER — METHYLPREDNISOLONE ACETATE 40 MG/ML IJ SUSP
80.0000 mg | Freq: Once | INTRAMUSCULAR | Status: AC
  Administered 2018-09-17: 80 mg via INTRA_ARTICULAR

## 2018-09-17 NOTE — Progress Notes (Signed)
GI side effects   . Prednisone     Tolerates intraarticular depo-medrol.  . Quetiapine Other (See Comments)  . Sertraline Hcl     REACTION: vivid dreams  . Simvastatin Other (See Comments)    achey  . Triazolam     REACTION: ? reaction  . Zaleplon   . Zocor [Simvastatin - High Dose]     achey  . Zolpidem Tartrate     hallucinations  . Hydrocodone Rash    REACTION: ? reaction REACTION: ? reaction  . Norelgestromin-Eth Estradiol     Patch didn't stick to skin    Medication list reviewed and updated in full in [place].  GEN: No fevers, chills. Nontoxic. Primarily MSK c/o today. MSK: Detailed in the HPI GI: tolerating PO intake without difficulty Neuro: No numbness, parasthesias, or tingling associated. Otherwise the pertinent positives of the ROS are noted above.   Objective:   BP 100/60   Pulse 96   Temp 99.4 F (37.4 C) (Temporal)   Ht 5\' 4"  (1.626 m)   Wt (!) 336 lb 8 oz (152.6 kg)   SpO2 95%   BMI 57.76 kg/m    GEN: WDWN, NAD, Non-toxic, Alert & Oriented x 3 HEENT: Atraumatic, Normocephalic.  Ears and Nose: No external deformity. EXTR: No clubbing/cyanosis/edema NEURO: Normal gait.  PSYCH: Normally interactive. Conversant. Not depressed or anxious appearing.  Calm demeanor.    Left knee: Lacks 2 degrees of extension.  Flexion to 110 degrees.  Minimal to mild effusion only.  She has notable medial joint line tenderness.  Lateral joint line tenderness is significantly less.  Stable to varus and valgus stress.  Pain with flexion  pinch.  Minimal pain with bounce home.  Radiology: No results found.  Assessment and Plan:     ICD-10-CM   1. Arthritis of left knee  M17.12 methylPREDNISolone acetate (DEPO-MEDROL) injection 80 mg   Mackenzie Bonilla has been doing well over the last 2 years since I saw her last.  Last time I gave her bilateral Synvisc 1 injections and she had relief of symptoms for approximately 2 years.  We are going to work on getting her prior auth and on for Visco supplementation.  Today her left knee is quite symptomatic, so I am can inject this with corticosteroid.  Aspiration/Injection Procedure Note Mackenzie Bonilla 1965-03-23 Date of procedure: 09/17/2018  Procedure: Large Joint Aspiration / Injection of Knee, L Indications: Pain  Procedure Details Patient verbally consented to procedure. Risks (including potential rare risk of infection), benefits, and alternatives explained. Sterilely prepped with Chloraprep. Ethyl cholride used for anesthesia. 8 cc Lidocaine 1% mixed with 2 mL Depo-Medrol 40 mg injected using the anteromedial approach without difficulty. No complications with procedure and tolerated well. Patient had decreased pain post-injection. Medication: 2 mL of Depo-Medrol 40 mg, equaling Depo-Medrol 80 mg total   Follow-up: No follow-ups on file.  Meds ordered this encounter  Medications  . methylPREDNISolone acetate (DEPO-MEDROL) injection 80 mg   No orders of the defined types were placed in this encounter.   Signed,  Maud Deed. Elex Mainwaring, MD   Outpatient Encounter Medications as of 09/17/2018  Medication Sig  . ACCU-CHEK AVIVA PLUS test strip Use to check blood sugar  once a day  . ACCU-CHEK SOFTCLIX LANCETS lancets Use to check blood sugar  once a day  . acetaminophen (TYLENOL) 500 MG tablet Take 500 mg by mouth every 6 (six) hours as needed.   Marland Kitchen albuterol (VENTOLIN HFA) 108 (90 Base)  Mackenzie Bonilla T. Goebel Hellums, MD [facility] [place] [facility] [place], [address] Phone: [phone]  FAX: [phone]  Mackenzie Bonilla - 53 y.o. female  MRN [id number]  Date of Birth: 08-27-1965  Visit Date: 09/17/2018  PCP: Abner Greenspan, MD  Referred by: Tower, Wynelle Fanny, MD  Chief Complaint  Patient presents with  . Knee Pain    Left   Subjective:   Mackenzie Bonilla is a 53 y.o. very pleasant female patient with Body mass index is 57.76 kg/m. who presents with the following:  L sided knee pain with known OA.  In prior years she responded great to both Synvisc 3 and Synvisc 1.  Most recently she had a almost 2-year relief of symptoms after bilateral Synvisc 1 injections.  It is only when about the last month that she has had a return of some symptoms.  Right now she has had having pain getting in and out of a car and going up stairs.  She is not having any mechanical symptoms.  LEFT KNEE INJ  CHECK ON VISCOSUPP  Past Medical History, Surgical History, Social History, Family History, Problem List, Medications, and Allergies have been reviewed and updated if relevant.  Patient Active Problem List   Diagnosis Date Noted  . UTI (urinary tract infection) 04/08/2018  . Eating disorder 05/23/2017  . Gallstones 04/19/2017  . Diabetic polyneuropathy associated with type 2 diabetes mellitus ([place]) 04/07/2017  . Diabetic angiopathy ([place]) 04/07/2017  . Vasomotor symptoms due to menopause 04/07/2017  . Mobility impaired 11/12/2016  . Urinary incontinence 12/02/2015  . Candidal intertrigo 04/06/2015  . Cystocele 04/06/2015  . Constipation 04/06/2015  . Low back pain 12/28/2014  . Hypertriglyceridemia 10/14/2014  . Breast mass in female 07/28/2014  . Fall 07/23/2013  . Great toe pain 05/12/2013  . Medication management 11/20/2012  . Uric acid kidney stone 11/20/2011  . Hematuria 08/09/2010  . HYPERHIDROSIS  11/07/2009  . Menopausal symptoms 06/17/2009  . Type 2 diabetes mellitus without complication, without long-term current use of insulin ([place]) 08/23/2008  . VITAMIN D DEFICIENCY 05/07/2008  . VITAMIN B12 DEFICIENCY 06/04/2007  . CHRONIC FATIGUE SYNDROME 11/15/2006  . DIVERTICULITIS, HX OF 11/15/2006  . PCOS (polycystic ovarian syndrome) 10/16/2006  . Hyperlipidemia LDL goal <100 10/16/2006  . Morbid obesity with BMI of 50.0-59.9, adult ([place]) 10/16/2006  . Generalized anxiety disorder 10/16/2006  . PANIC DISORDER 10/16/2006  . BULIMIA 10/16/2006  . Chronic depression 10/16/2006  . CARPAL TUNNEL SYNDROME 10/16/2006  . LYMPHEDEMA 10/16/2006  . Allergic rhinitis 10/16/2006  . Mild intermittent asthma 10/16/2006  . TMJ SYNDROME 10/16/2006  . GERD 10/16/2006  . IRRITABLE BOWEL SYNDROME 10/16/2006  . OSTEOARTHRITIS 10/16/2006  . COUGH, CHRONIC 10/16/2006    Past Medical History:  Diagnosis Date  . Allergy    allergic rhinitis  . Anemia   . Anxiety   . Arthritis    osteoarthritis  . Asthma   . Claustrophobia    does not like oxygen mask covering face  . Depression   . Diabetes mellitus without complication ([place])   . Fall 2016  . Fatty liver 07/2008   abd. ultrasound - fatty liver ; slt dilated cbd (no stones ) 06/10// abd.  ultrasound normal on 04/2006  . Fibromyalgia   . GERD (gastroesophageal reflux disease)    EGD negative 06/2001// EGD erythematous mucosa, polyp 08/2008  . High uric acid in 24 hour urine specimen   .  GI side effects   . Prednisone     Tolerates intraarticular depo-medrol.  . Quetiapine Other (See Comments)  . Sertraline Hcl     REACTION: vivid dreams  . Simvastatin Other (See Comments)    achey  . Triazolam     REACTION: ? reaction  . Zaleplon   . Zocor [Simvastatin - High Dose]     achey  . Zolpidem Tartrate     hallucinations  . Hydrocodone Rash    REACTION: ? reaction REACTION: ? reaction  . Norelgestromin-Eth Estradiol     Patch didn't stick to skin    Medication list reviewed and updated in full in [place].  GEN: No fevers, chills. Nontoxic. Primarily MSK c/o today. MSK: Detailed in the HPI GI: tolerating PO intake without difficulty Neuro: No numbness, parasthesias, or tingling associated. Otherwise the pertinent positives of the ROS are noted above.   Objective:   BP 100/60   Pulse 96   Temp 99.4 F (37.4 C) (Temporal)   Ht 5\' 4"  (1.626 m)   Wt (!) 336 lb 8 oz (152.6 kg)   SpO2 95%   BMI 57.76 kg/m    GEN: WDWN, NAD, Non-toxic, Alert & Oriented x 3 HEENT: Atraumatic, Normocephalic.  Ears and Nose: No external deformity. EXTR: No clubbing/cyanosis/edema NEURO: Normal gait.  PSYCH: Normally interactive. Conversant. Not depressed or anxious appearing.  Calm demeanor.    Left knee: Lacks 2 degrees of extension.  Flexion to 110 degrees.  Minimal to mild effusion only.  She has notable medial joint line tenderness.  Lateral joint line tenderness is significantly less.  Stable to varus and valgus stress.  Pain with flexion  pinch.  Minimal pain with bounce home.  Radiology: No results found.  Assessment and Plan:     ICD-10-CM   1. Arthritis of left knee  M17.12 methylPREDNISolone acetate (DEPO-MEDROL) injection 80 mg   Mackenzie Bonilla has been doing well over the last 2 years since I saw her last.  Last time I gave her bilateral Synvisc 1 injections and she had relief of symptoms for approximately 2 years.  We are going to work on getting her prior auth and on for Visco supplementation.  Today her left knee is quite symptomatic, so I am can inject this with corticosteroid.  Aspiration/Injection Procedure Note Mackenzie Bonilla 1965-03-23 Date of procedure: 09/17/2018  Procedure: Large Joint Aspiration / Injection of Knee, L Indications: Pain  Procedure Details Patient verbally consented to procedure. Risks (including potential rare risk of infection), benefits, and alternatives explained. Sterilely prepped with Chloraprep. Ethyl cholride used for anesthesia. 8 cc Lidocaine 1% mixed with 2 mL Depo-Medrol 40 mg injected using the anteromedial approach without difficulty. No complications with procedure and tolerated well. Patient had decreased pain post-injection. Medication: 2 mL of Depo-Medrol 40 mg, equaling Depo-Medrol 80 mg total   Follow-up: No follow-ups on file.  Meds ordered this encounter  Medications  . methylPREDNISolone acetate (DEPO-MEDROL) injection 80 mg   No orders of the defined types were placed in this encounter.   Signed,  Maud Deed. Elex Mainwaring, MD   Outpatient Encounter Medications as of 09/17/2018  Medication Sig  . ACCU-CHEK AVIVA PLUS test strip Use to check blood sugar  once a day  . ACCU-CHEK SOFTCLIX LANCETS lancets Use to check blood sugar  once a day  . acetaminophen (TYLENOL) 500 MG tablet Take 500 mg by mouth every 6 (six) hours as needed.   Marland Kitchen albuterol (VENTOLIN HFA) 108 (90 Base)  Mackenzie Bonilla T. Goebel Hellums, MD [facility] [place] [facility] [place], [address] Phone: [phone]  FAX: [phone]  Mackenzie Bonilla - 53 y.o. female  MRN [id number]  Date of Birth: 08-27-1965  Visit Date: 09/17/2018  PCP: Abner Greenspan, MD  Referred by: Tower, Wynelle Fanny, MD  Chief Complaint  Patient presents with  . Knee Pain    Left   Subjective:   Mackenzie Bonilla is a 53 y.o. very pleasant female patient with Body mass index is 57.76 kg/m. who presents with the following:  L sided knee pain with known OA.  In prior years she responded great to both Synvisc 3 and Synvisc 1.  Most recently she had a almost 2-year relief of symptoms after bilateral Synvisc 1 injections.  It is only when about the last month that she has had a return of some symptoms.  Right now she has had having pain getting in and out of a car and going up stairs.  She is not having any mechanical symptoms.  LEFT KNEE INJ  CHECK ON VISCOSUPP  Past Medical History, Surgical History, Social History, Family History, Problem List, Medications, and Allergies have been reviewed and updated if relevant.  Patient Active Problem List   Diagnosis Date Noted  . UTI (urinary tract infection) 04/08/2018  . Eating disorder 05/23/2017  . Gallstones 04/19/2017  . Diabetic polyneuropathy associated with type 2 diabetes mellitus ([place]) 04/07/2017  . Diabetic angiopathy ([place]) 04/07/2017  . Vasomotor symptoms due to menopause 04/07/2017  . Mobility impaired 11/12/2016  . Urinary incontinence 12/02/2015  . Candidal intertrigo 04/06/2015  . Cystocele 04/06/2015  . Constipation 04/06/2015  . Low back pain 12/28/2014  . Hypertriglyceridemia 10/14/2014  . Breast mass in female 07/28/2014  . Fall 07/23/2013  . Great toe pain 05/12/2013  . Medication management 11/20/2012  . Uric acid kidney stone 11/20/2011  . Hematuria 08/09/2010  . HYPERHIDROSIS  11/07/2009  . Menopausal symptoms 06/17/2009  . Type 2 diabetes mellitus without complication, without long-term current use of insulin ([place]) 08/23/2008  . VITAMIN D DEFICIENCY 05/07/2008  . VITAMIN B12 DEFICIENCY 06/04/2007  . CHRONIC FATIGUE SYNDROME 11/15/2006  . DIVERTICULITIS, HX OF 11/15/2006  . PCOS (polycystic ovarian syndrome) 10/16/2006  . Hyperlipidemia LDL goal <100 10/16/2006  . Morbid obesity with BMI of 50.0-59.9, adult ([place]) 10/16/2006  . Generalized anxiety disorder 10/16/2006  . PANIC DISORDER 10/16/2006  . BULIMIA 10/16/2006  . Chronic depression 10/16/2006  . CARPAL TUNNEL SYNDROME 10/16/2006  . LYMPHEDEMA 10/16/2006  . Allergic rhinitis 10/16/2006  . Mild intermittent asthma 10/16/2006  . TMJ SYNDROME 10/16/2006  . GERD 10/16/2006  . IRRITABLE BOWEL SYNDROME 10/16/2006  . OSTEOARTHRITIS 10/16/2006  . COUGH, CHRONIC 10/16/2006    Past Medical History:  Diagnosis Date  . Allergy    allergic rhinitis  . Anemia   . Anxiety   . Arthritis    osteoarthritis  . Asthma   . Claustrophobia    does not like oxygen mask covering face  . Depression   . Diabetes mellitus without complication ([place])   . Fall 2016  . Fatty liver 07/2008   abd. ultrasound - fatty liver ; slt dilated cbd (no stones ) 06/10// abd.  ultrasound normal on 04/2006  . Fibromyalgia   . GERD (gastroesophageal reflux disease)    EGD negative 06/2001// EGD erythematous mucosa, polyp 08/2008  . High uric acid in 24 hour urine specimen   .

## 2018-09-17 NOTE — Patient Instructions (Signed)
If you want to do the synthetic injections, then we need to get some of the billing people to check on your insurance and what they prefer as their medication. (For cost)  When your steroid injection wears off, call our office and get them to send the message to me, and we will make sure that you get the right medicine ordered for you.

## 2018-09-19 ENCOUNTER — Telehealth: Payer: Self-pay | Admitting: Family Medicine

## 2018-09-19 NOTE — Telephone Encounter (Signed)
Best number [phone] Pt called stating she had a steroid injection in left knee wed with dr copland.  She  stated that her knee is still  very painful.  She has been taking ibuprofen and staying off of it.  She stated when she walks it still hurts. And wanted to know what she needed to do

## 2018-09-19 NOTE — Telephone Encounter (Signed)
Pt said she doesn't want to see ortho she said that her and Dr. Lorelei Pont had a plan of seeing what injection is covered through her insurance and ordering that medication for him to give her at the office. Pt would like to wait until Dr. Lorelei Pont see's message and proceed with plan they discussed, I advised pt message was also routed to Dr. Lorelei Pont so him/his assistant will f/u with her next week

## 2018-09-19 NOTE — Telephone Encounter (Signed)
Aware, thanks!

## 2018-09-19 NOTE — Telephone Encounter (Signed)
There is no way it would work this fast - I am working on getting her viscosupplementation approved

## 2018-09-19 NOTE — Telephone Encounter (Signed)
Next- I would send her to orthopedics  Is she open to that?

## 2018-09-22 NOTE — Telephone Encounter (Signed)
Ibuprofen 800 mg (4 over the counter tablets) 3 times a day Tylenol 2 extra strength (1000 mg) 3 times a day  Tramadol 50 mg, 1 po q 6 hours prn pain, #20, 0 refills   If her knee is red or hot, I can work her in at any point.  Visco should be eminently worked out.

## 2018-09-22 NOTE — Telephone Encounter (Signed)
Please call patient back at [phone].

## 2018-09-22 NOTE — Telephone Encounter (Signed)
Spoke with Mackenzie Bonilla to advise her that we are working on getting the Synvisc One approved for her.  I advised that the person that does this for [place] was on vacation last week but hopefully she can get to it this week.  I did let her know that it can take a couple of weeks to get approval.  Patient states that her knee pain is worse that before the steroid shot she got on 09/17/2018.  She states she can hardly walk.  She has been taking 1 to 3 ibuprofen a day and icing her knee but she is still in pain.  She wants to know what she can do until we find out if the viscosupplementation is approved.

## 2018-09-23 NOTE — Telephone Encounter (Signed)
Patti notified as instructed by telephone.  She states she spoke with her pharmacist and ask for something to numb her knee and she recommend that she try diclofenac.  So her parents went and picked her up a tube of diclofenac cream. She states the instructions that came with the cream advised not to take ibuprofen while using the diclofenac cream.  Please advise.

## 2018-09-23 NOTE — Telephone Encounter (Signed)
You can take with ibuprofen- only 5 percent gets in bloodstream

## 2018-09-24 NOTE — Telephone Encounter (Signed)
Patti notified as instructed by telephone.

## 2018-09-26 NOTE — Telephone Encounter (Signed)
I literally just checked on this again within the last 20 minutes.  Unfortunately this can't be done in a few days.  Forms have to be filled out and the hold-up is often the insurance companies themselves.   I have medication in stock, but now (not years ago) they will routinely deny payment without an approval.

## 2018-09-26 NOTE — Telephone Encounter (Addendum)
Spoke with Mackenzie Bonilla and advised her that we are still waiting on approval from her insurance.  She states the ibuprofen and tylenol is upsetting her stomach.  I advised her that it is okay that she cut back on the dosing to maybe 2 ibuprofen and 1 tylenol to see if that helps her stomach. I did advised that she should continue to use the Diclofenac cream.  I promised I would call her as soon as we hear anything from her insurance.

## 2018-09-26 NOTE — Telephone Encounter (Signed)
Have you heard anything back on approval for Synvisc?

## 2018-09-26 NOTE — Telephone Encounter (Signed)
Patient called and requested a call back from Dr Lillie Fragmin medical assistant  She would like to discuss a medication/insurance   Patient c/b # [phone]

## 2018-09-30 NOTE — Telephone Encounter (Signed)
Patient's calling about the Synvisc.  Patient said it's been over 3 weeks trying to get approval.  Patient said she spent pretty much the whole time in the house because she can't go out due to the pain. Please call patient with update at [phone].

## 2018-10-01 ENCOUNTER — Telehealth: Payer: Self-pay | Admitting: Family Medicine

## 2018-10-01 NOTE — Telephone Encounter (Signed)
The hold-up is her insurance. I am happy to give her synvisc-one that I have in stock, but it is her financial liability.  If she chooses to do this I am happy to give her her injection at any point.   We will absolutely let her know when we hear - day of from her insurance.

## 2018-10-01 NOTE — Telephone Encounter (Signed)
----- Message from Carter Kitten, [place] sent at 10/01/2018  1:45 PM EDT ----- Regarding: RE: Visco  ----- Message ----- From: Alphonsus Sias, CMA Sent: 10/01/2018  12:51 PM EDT To: Carter Kitten, CMA Subject: RE: Antony Blackbird so [place] have both been approved for Durolane.   I know Dr. Lorelei Pont said Ms. Koble has had Synvisc in the past but her insurance has approved Durolane. This is a series of 1 injection.   If ya'll want to proceed with the injections for both patients then i'll go ahead & have the injections sent to your office.  ----- Message ----- From: Carter Kitten, CMA Sent: 10/01/2018  10:50 AM EDT To: Alphonsus Sias, CMA Subject: RE: Visco                                      No problem.  Please let me know as soon as you find out anything.  Butch Penny ----- Message ----- From: Alphonsus Sias, CMA Sent: 10/01/2018  10:34 AM EDT To: Carter Kitten, CMA Subject: RE: Zoila Shutter, i'm sorry it has taken so long. I had to wait to run her so they could add Dr. Lorelei Pont to the portal. I did run her this morning so I should hear back any minute now.  ----- Message ----- From: Carter Kitten, CMA Sent: 10/01/2018  10:27 AM EDT To: Alphonsus Sias, CMA Subject: RE: Visco                                      Have you heard anything from insurance on Patti.  She is about to drive [place] crazy calling all the time about the approve.  Please let me know as soon as you can.  Butch Penny ----- Message ----- From: Alphonsus Sias, CMA Sent: 09/22/2018  10:14 AM EDT To: Owens Loffler, MD Subject: RE: Christa See Dr. Edilia Bo, sorry i'm just now getting back to you. I was on vacation last week.   I'll run the patients today & let you know what they are approved for.  ----- Message ----- From: Owens Loffler, MD Sent: 09/17/2018   1:27 PM  EDT To: Alphonsus Sias, CMA Subject: Oran Rein, I hope it is cool that email you like this about the visco patients?  Precious Bard has had a great response in the past from [place] and [place] if that makes  a difference.   Do you need anything more from me besides my NPI number? (sorry I was slow on that)  Thanks so much. Frederico Hamman

## 2018-10-01 NOTE — Telephone Encounter (Signed)
Left message for Mackenzie Bonilla that her insurance will not cover the Synvisc.  They will cover a medication called Durolane, which we do not keep in stock at the office, so it would have to be special ordered and can take a week or so to come in.  I ask that she call us back to let us know if she wants [place] to order the Durolane or refer her out to an orthopedist.

## 2018-10-01 NOTE — Telephone Encounter (Signed)
Spoke with Precious Bard about the Durolane.  She has questions that I can not answer.  I told her I would try to have Dr. Lorelei Pont give her a call to address her questions.

## 2018-10-02 NOTE — Telephone Encounter (Signed)
I tried to answer all of her questions.    Ria Comment, can you order [place] Durolane for Ms. Kroenke  Thanks!

## 2018-10-09 ENCOUNTER — Other Ambulatory Visit: Payer: Self-pay

## 2018-10-09 ENCOUNTER — Encounter: Payer: Self-pay | Admitting: Family Medicine

## 2018-10-09 ENCOUNTER — Ambulatory Visit (INDEPENDENT_AMBULATORY_CARE_PROVIDER_SITE_OTHER): Payer: Medicare Other | Admitting: Family Medicine

## 2018-10-09 DIAGNOSIS — M1712 Unilateral primary osteoarthritis, left knee: Secondary | ICD-10-CM

## 2018-10-09 MED ORDER — SODIUM HYALURONATE 60 MG/3ML IX PRSY
60.0000 mg | PREFILLED_SYRINGE | Freq: Once | INTRA_ARTICULAR | Status: AC
  Administered 2018-10-09: 60 mg via INTRAMUSCULAR

## 2018-10-09 NOTE — Progress Notes (Signed)
Mackenzie Giaimo T. Zakiah Gauthreaux, MD Primary Care and Sports Medicine [facility] at [facility] [address] [place], [address] Phone: [phone]  FAX: [phone]  Mackenzie Bonilla - 53 y.o. female  MRN [id number]  Date of Birth: Apr 27, 1965  Visit Date: 10/09/2018  PCP: Judy Pimple, MD  Referred by: Tower, Audrie Gallus, MD  Chief Complaint  Patient presents with  . Knee Pain    Left-Durolane Injection    Known significant OA with good response to Synvisc in the past.   Aspiration/Injection Procedure Note Mackenzie Bonilla [birth year] Date of procedure: 10/09/2018  Procedure: Large Joint Aspiration / Injection of Knee for Viscosupplementation, LEFT Medication: Durolane 60 mg / 3 mL Indications: Pain J7318, # 60 units  Procedure Details Patient verbally consented to procedure. Risks (including infection), benefits, and alternatives explained. Sterilely prepped with Chloraprep. Ethyl cholride used for anesthesia, then 7 cc of Lidocaine 1% used for anesthesia in the anterolateral position. Reprepped with Chloraprep.  Anteromedial approach used to inject joint without difficulty, injected with Durolane 60 mg/3 mL. No complications with procedure and tolerated well.   Signed,  Elpidio Galea. Payton Prinsen, MD

## 2018-10-13 ENCOUNTER — Ambulatory Visit (INDEPENDENT_AMBULATORY_CARE_PROVIDER_SITE_OTHER): Payer: Medicare Other | Admitting: Family Medicine

## 2018-10-13 ENCOUNTER — Encounter: Payer: Self-pay | Admitting: Family Medicine

## 2018-10-13 ENCOUNTER — Other Ambulatory Visit: Payer: Self-pay

## 2018-10-13 VITALS — BP 124/78 | HR 95 | Temp 98.0°F | Ht 64.0 in | Wt 333.1 lb

## 2018-10-13 DIAGNOSIS — B372 Candidiasis of skin and nail: Secondary | ICD-10-CM | POA: Diagnosis not present

## 2018-10-13 DIAGNOSIS — Z6841 Body Mass Index (BMI) 40.0 and over, adult: Secondary | ICD-10-CM

## 2018-10-13 MED ORDER — NYSTATIN 100000 UNIT/GM EX POWD: Freq: Three times a day (TID) | CUTANEOUS | 3 refills | Status: DC

## 2018-10-13 MED ORDER — KETOCONAZOLE 2 % EX CREA: 1.0000 "application " | TOPICAL_CREAM | Freq: Every day | CUTANEOUS | 3 refills | Status: DC | PRN

## 2018-10-13 NOTE — Assessment & Plan Note (Addendum)
Discussed how this problem influences overall health and the risks it imposes  Reviewed plan for weight loss with lower calorie diet (via better food choices and also portion control or program like weight watchers) and exercise building up to or more than 30 minutes 5 days per week including some aerobic activity   This makes it difficult to perform hygiene for herself -intertrigo is a problem  Pt has emotional eating issues-working with therapist and psychiatrist Continues to struggle

## 2018-10-13 NOTE — Progress Notes (Signed)
Subjective:    Patient ID: Mackenzie Bonilla, female    DOB: [birth year], 53 y.o.   MRN: [id number]  HPI Here for ongoing yeast rash Ongoing in groin (L) Strange shape  Starts in a ring of redness and white in the middle   Has taken a long time to get rid of in the past    Using ketoconazole cream  Started that last week  Using z sorb powder during the day regularly as well  Not using nystatin powder   Last saw dermatology about a mo ago -tx some skin spots Did not address this however    Wt Readings from Last 3 Encounters:  10/13/18 (!) 333 lb 2 oz (151.1 kg)  10/09/18 (!) 333 lb (151 kg)  09/17/18 (!) 336 lb 8 oz (152.6 kg)   57.18 kg/m    Sees endocrinology for her DM2-Dr Dwyane Dee  Worse in past 2 months   Her depression is not well controlled  Good and bad days   Patient Active Problem List   Diagnosis Date Noted  . UTI (urinary tract infection) 04/08/2018  . Eating disorder 05/23/2017  . Gallstones 04/19/2017  . Diabetic polyneuropathy associated with type 2 diabetes mellitus ([place]) 04/07/2017  . Diabetic angiopathy ([place]) 04/07/2017  . Vasomotor symptoms due to menopause 04/07/2017  . Mobility impaired 11/12/2016  . Urinary incontinence 12/02/2015  . Candidal intertrigo 04/06/2015  . Cystocele 04/06/2015  . Constipation 04/06/2015  . Low back pain 12/28/2014  . Hypertriglyceridemia 10/14/2014  . Breast mass in female 07/28/2014  . Fall 07/23/2013  . Great toe pain 05/12/2013  . Medication management 11/20/2012  . Uric acid kidney stone 11/20/2011  . Hematuria 08/09/2010  . HYPERHIDROSIS 11/07/2009  . Menopausal symptoms 06/17/2009  . Type 2 diabetes mellitus without complication, without long-term current use of insulin ([place]) 08/23/2008  . VITAMIN D DEFICIENCY 05/07/2008  . VITAMIN B12 DEFICIENCY 06/04/2007  . CHRONIC FATIGUE SYNDROME 11/15/2006  . DIVERTICULITIS, HX OF 11/15/2006  . PCOS (polycystic ovarian syndrome) 10/16/2006  . Hyperlipidemia  LDL goal <100 10/16/2006  . Morbid obesity with BMI of 50.0-59.9, adult ([place]) 10/16/2006  . Generalized anxiety disorder 10/16/2006  . PANIC DISORDER 10/16/2006  . BULIMIA 10/16/2006  . Chronic depression 10/16/2006  . CARPAL TUNNEL SYNDROME 10/16/2006  . LYMPHEDEMA 10/16/2006  . Allergic rhinitis 10/16/2006  . Mild intermittent asthma 10/16/2006  . TMJ SYNDROME 10/16/2006  . GERD 10/16/2006  . IRRITABLE BOWEL SYNDROME 10/16/2006  . OSTEOARTHRITIS 10/16/2006  . COUGH, CHRONIC 10/16/2006   Past Medical History:  Diagnosis Date  . Allergy    allergic rhinitis  . Anemia   . Anxiety   . Arthritis    osteoarthritis  . Asthma   . Claustrophobia    does not like oxygen mask covering face  . Depression   . Diabetes mellitus without complication ([place])   . Fall 2016  . Fatty liver 07/2008   abd. ultrasound - fatty liver ; slt dilated cbd (no stones ) 06/10// abd.  ultrasound normal on 04/2006  . Fibromyalgia   . GERD (gastroesophageal reflux disease)    EGD negative 06/2001// EGD erythematous mucosa, polyp 08/2008  . High uric acid in 24 hour urine specimen   . Hyperhidrosis   . Hyperlipidemia   . Kidney stones   . Lymphedema   . Obesity   . Shortness of breath dyspnea   . Tachycardia   . TMJ (dislocation of temporomandibular joint)   . UTI (lower urinary tract infection)  Past Surgical History:  Procedure Laterality Date  . BREAST BIOPSY Right 2016   neg  . BREAST LUMPECTOMY WITH RADIOACTIVE SEED LOCALIZATION Right 08/02/2014   Procedure: BREAST LUMPECTOMY WITH RADIOACTIVE SEED LOCALIZATION;  Surgeon: Rolm Bookbinder, MD;  Location: [facility];  Service: General;  Laterality: Right;  . COLONOSCOPY  08/12/2012   Completed by Dr. Phylis Bougie, normal exam.   . ENDOMETRIAL BIOPSY  01/2004  . ESOPHAGOGASTRODUODENOSCOPY    . FOOT SURGERY    . SEPTOPLASTY     two times  . TONSILLECTOMY    . UTERINE FIBROID SURGERY  06/2005   ablation  . uterine tumor  09/2001  . VAGUS NERVE  STIMULATOR INSERTION     Social History   Tobacco Use  . Smoking status: Never Smoker  . Smokeless tobacco: Never Used  Substance Use Topics  . Alcohol use: No    Alcohol/week: 0.0 standard drinks  . Drug use: No   Family History  Problem Relation Age of Onset  . Hypertension Father   . Cancer Father        pancreatic cancer  . Hypertension Sister   . Heart disease Maternal Grandmother 60       MI  . Diabetes Maternal Grandmother   . Heart disease Paternal Grandmother 14       MI  . Diabetes Paternal Grandmother   . Breast cancer Neg Hx    Allergies  Allergen Reactions  . Cephalexin   . Codeine     REACTION: ? reaction  . Duloxetine   . Levaquin [Levofloxacin] Nausea Only  . Lisinopril Cough  . Metformin And Related Diarrhea    GI side effects   . Prednisone     Tolerates intraarticular depo-medrol.  . Quetiapine Other (See Comments)  . Sertraline Hcl     REACTION: vivid dreams  . Simvastatin Other (See Comments)    achey  . Triazolam     REACTION: ? reaction  . Zaleplon   . Zocor [Simvastatin - High Dose]     achey  . Zolpidem Tartrate     hallucinations  . Hydrocodone Rash    REACTION: ? reaction REACTION: ? reaction  . Norelgestromin-Eth Estradiol     Patch didn't stick to skin   Current Outpatient Medications on File Prior to Visit  Medication Sig Dispense Refill  . ACCU-CHEK AVIVA PLUS test strip Use to check blood sugar  once a day 100 each 0  . ACCU-CHEK SOFTCLIX LANCETS lancets Use to check blood sugar  once a day 100 each 0  . acetaminophen (TYLENOL) 500 MG tablet Take 500 mg by mouth every 6 (six) hours as needed.     Marland Kitchen albuterol (VENTOLIN HFA) 108 (90 Base) MCG/ACT inhaler Inhale 2 puffs into the lungs every 6 (six) hours as needed for wheezing. 1 Inhaler 1  . ALPRAZolam (XANAX) 1 MG tablet Take 2 mg by mouth 3 (three) times daily as needed.     . Blood Glucose Calibration (ACCU-CHEK AVIVA) SOLN     . Cetirizine HCl 10 MG CAPS Take 1 capsule  by mouth daily.    . cyclobenzaprine (FLEXERIL) 5 MG tablet TAKE ONE TABLET THREE TIMES A DAY IF NEEDED FOR MUSCLE SPASM 30 tablet 3  . desvenlafaxine (PRISTIQ) 100 MG 24 hr tablet     . dimenhyDRINATE (DRAMAMINE) 50 MG tablet Take 50 mg by mouth every 8 (eight) hours as needed.    . gabapentin (NEURONTIN) 100 MG capsule Take 1 capsule (100 mg  total) by mouth 2 (two) times daily. 360 capsule 3  . gabapentin (NEURONTIN) 300 MG capsule Take 1 capsule (300 mg total) by mouth at bedtime. (Patient taking differently: Take 300 mg by mouth 4 (four) times daily. ) 90 capsule 3  . glipiZIDE (GLUCOTROL XL) 5 MG 24 hr tablet Take 5 mg by mouth daily with breakfast.    . ibuprofen (ADVIL,MOTRIN) 600 MG tablet Take 1 tablet (600 mg total) by mouth every 8 (eight) hours as needed. 30 tablet 0  . loratadine (CLARITIN) 10 MG tablet Take by mouth.    . losartan (COZAAR) 25 MG tablet     . omeprazole (PRILOSEC) 20 MG capsule TAKE 1 CAPSULE BY MOUTH TWICE DAILY 180 capsule 1  . oxybutynin (DITROPAN XL) 15 MG 24 hr tablet Take 1 tablet (15 mg total) by mouth daily. 90 tablet 1  . Polyethyl Glycol-Propyl Glycol (SYSTANE OP) Apply 1 drop to eye daily as needed.    . pravastatin (PRAVACHOL) 20 MG tablet TAKE 1 TABLET BY MOUTH NIGHTLY 30 tablet 5  . Simethicone (GAS-X PO) Take by mouth.    . spironolactone (ALDACTONE) 100 MG tablet TAKE ONE TABLET BY MOUTH EVERY DAY 30 tablet 3  . traMADol (ULTRAM) 50 MG tablet Take 1 tablet (50 mg total) by mouth 2 (two) times daily as needed for moderate pain or severe pain. 30 tablet 0  . traZODone (DESYREL) 150 MG tablet Take 300 mg by mouth at bedtime.     . triamcinolone cream (KENALOG) 0.1 %     . TRULICITY 1.5 0000000 SOPN INJECT CONTENTS OF 1 PEN ONCE A WEEK 2 mL 3   No current facility-administered medications on file prior to visit.      Review of Systems  Constitutional: Positive for fatigue. Negative for activity change, appetite change, fever and unexpected weight  change.  HENT: Negative for congestion, ear pain, rhinorrhea, sinus pressure and sore throat.   Eyes: Negative for pain, redness and visual disturbance.  Respiratory: Negative for cough, shortness of breath and wheezing.   Cardiovascular: Negative for chest pain and palpitations.  Gastrointestinal: Positive for constipation, diarrhea and nausea. Negative for abdominal pain and blood in stool.       Chronic GI issues  Endocrine: Negative for polydipsia and polyuria.  Genitourinary: Negative for dysuria, frequency and urgency.  Musculoskeletal: Negative for arthralgias, back pain and myalgias.  Skin: Positive for rash. Negative for pallor.  Allergic/Immunologic: Negative for environmental allergies.  Neurological: Negative for dizziness, syncope and headaches.  Hematological: Negative for adenopathy. Does not bruise/bleed easily.  Psychiatric/Behavioral: Positive for decreased concentration and dysphoric mood. The patient is nervous/anxious.        Objective:   Physical Exam Constitutional:      General: She is not in acute distress.    Appearance: Normal appearance. She is obese. She is not ill-appearing.     Comments: Morbidly obese  Ambulates slowly  HENT:     Head: Normocephalic and atraumatic.  Cardiovascular:     Rate and Rhythm: Normal rate and regular rhythm.  Abdominal:     Comments: Large pannus  Skin:    General: Skin is warm and dry.     Findings: Erythema present.     Comments: Erythematous rash in skin fold of L groin (less so under breasts)  Red in color /few satellite lesions  Scab about 1/2 way down with purple color looks like abraded/ bruised skin tag that has auto amputated (does not appear infected)  No areas of skin breakdown or discharge and no odor   Neurological:     Mental Status: She is alert.  Psychiatric:        Mood and Affect: Mood is depressed.        Cognition and Memory: Cognition normal.     Comments: Pt states she has had a depressing  month           Assessment & Plan:   Problem List Items Addressed This Visit      Musculoskeletal and Integument   Candidal intertrigo    Recurrent -worse in L groin (also gets under breasts) Counseled to keep areas as clean and dry as possible Stay out of the heat Continue ketoconazole daily  Given nystatin to use instead of z sorb (she can decide which works better)  Scab resembles abraded area/poss skin tag  Keeping rash under control should help this also inst to call if worse or no improvement       Relevant Medications   ketoconazole (NIZORAL) 2 % cream   nystatin (MYCOSTATIN/NYSTOP) powder     Other   Morbid obesity with BMI of 50.0-59.9, adult ([place]) - Primary    Discussed how this problem influences overall health and the risks it imposes  Reviewed plan for weight loss with lower calorie diet (via better food choices and also portion control or program like weight watchers) and exercise building up to or more than 30 minutes 5 days per week including some aerobic activity   This makes it difficult to perform hygiene for herself -intertrigo is a problem  Pt has emotional eating issues-working with therapist and psychiatrist Continues to struggle

## 2018-10-13 NOTE — Assessment & Plan Note (Signed)
Recurrent -worse in L groin (also gets under breasts) Counseled to keep areas as clean and dry as possible Stay out of the heat Continue ketoconazole daily  Given nystatin to use instead of z sorb (she can decide which works better)  Scab resembles abraded area/poss skin tag  Keeping rash under control should help this also inst to call if worse or no improvement

## 2018-10-13 NOTE — Patient Instructions (Addendum)
Try to keep rash areas dry as much as you can  Use the ketoconazole cream daily after you dry area well  In between try the nystatin powder instead of the z sorb to see if it works better   The scabbed area looks ok (looks like it was abraded a bit)- just keep that clean and let me know if anything changes

## 2018-10-28 ENCOUNTER — Other Ambulatory Visit: Payer: Self-pay

## 2018-10-28 ENCOUNTER — Encounter: Payer: Self-pay | Admitting: Podiatry

## 2018-10-28 ENCOUNTER — Ambulatory Visit (INDEPENDENT_AMBULATORY_CARE_PROVIDER_SITE_OTHER): Payer: Medicare Other | Admitting: Podiatry

## 2018-10-28 DIAGNOSIS — M79676 Pain in unspecified toe(s): Secondary | ICD-10-CM | POA: Diagnosis not present

## 2018-10-28 DIAGNOSIS — B351 Tinea unguium: Secondary | ICD-10-CM | POA: Diagnosis not present

## 2018-10-28 DIAGNOSIS — E0843 Diabetes mellitus due to underlying condition with diabetic autonomic (poly)neuropathy: Secondary | ICD-10-CM

## 2018-10-31 NOTE — Progress Notes (Signed)
   SUBJECTIVE Patient with a history of diabetes mellitus presents to office today complaining of elongated, thickened nails that cause pain while ambulating in shoes. She is unable to trim her own nails. Patient is here for further evaluation and treatment.   Past Medical History:  Diagnosis Date  . Allergy    allergic rhinitis  . Anemia   . Anxiety   . Arthritis    osteoarthritis  . Asthma   . Claustrophobia    does not like oxygen mask covering face  . Depression   . Diabetes mellitus without complication (HCC)   . Fall 2016  . Fatty liver 07/2008   abd. ultrasound - fatty liver ; slt dilated cbd (no stones ) 06/10// abd.  ultrasound normal on 04/2006  . Fibromyalgia   . GERD (gastroesophageal reflux disease)    EGD negative 06/2001// EGD erythematous mucosa, polyp 08/2008  . High uric acid in 24 hour urine specimen   . Hyperhidrosis   . Hyperlipidemia   . Kidney stones   . Lymphedema   . Obesity   . Shortness of breath dyspnea   . Tachycardia   . TMJ (dislocation of temporomandibular joint)   . UTI (lower urinary tract infection)     OBJECTIVE General Patient is awake, alert, and oriented x 3 and in no acute distress. Derm Skin is dry and supple bilateral. Negative open lesions or macerations. Remaining integument unremarkable. Nails are tender, long, thickened and dystrophic with subungual debris, consistent with onychomycosis, 1-5 bilateral. No signs of infection noted. Vasc  DP and PT pedal pulses palpable bilaterally. Temperature gradient within normal limits.  Neuro Epicritic and protective threshold sensation diminished bilaterally.  Musculoskeletal Exam No symptomatic pedal deformities noted bilateral. Muscular strength within normal limits.  ASSESSMENT 1. Diabetes Mellitus w/ peripheral neuropathy 2. Onychomycosis of nail due to dermatophyte bilateral 3. Pain in foot bilateral  PLAN OF CARE 1. Patient evaluated today. 2. Instructed to maintain good pedal  hygiene and foot care. Stressed importance of controlling blood sugar.  3. Mechanical debridement of nails 1-5 bilaterally performed using a nail nipper. Filed with dremel without incident.  4. Return to clinic in 3 mos.     Delenn Ahn M. Everette Mall, DPM [facility]  Dr. Chaz Mcglasson M. Firmin Belisle, DPM    [address]                                        [place], [place] [address]                Office [phone]  Fax [phone]      

## 2018-11-05 ENCOUNTER — Other Ambulatory Visit: Payer: Self-pay | Admitting: Family Medicine

## 2018-11-10 ENCOUNTER — Telehealth: Payer: Self-pay

## 2018-11-10 NOTE — Telephone Encounter (Signed)
Are her joints swelling and/or turning red or hot? If so, which ones? Any fever or rash ?  How is her depression/stress?   I have seen these cause "all over" body pain as well  Does she hurt only in joints or in muscle areas in between joints also?

## 2018-11-10 NOTE — Telephone Encounter (Signed)
I looked up the Ozempic and do not see joint pain as a side eff Please f/u in office for a visit
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
## 2018-11-11 NOTE — Telephone Encounter (Signed)
Pt notified of Dr. Marliss Coots comments. Pt declined to schedule an appt now she said she will call back and schedule an appt when she's ready

## 2018-11-12 ENCOUNTER — Encounter: Payer: Self-pay | Admitting: Family Medicine

## 2018-11-12 ENCOUNTER — Ambulatory Visit: Payer: Medicare Other | Admitting: Family Medicine

## 2018-11-12 ENCOUNTER — Other Ambulatory Visit: Payer: Self-pay

## 2018-11-12 VITALS — BP 122/82 | HR 98 | Temp 97.6°F | Ht 64.0 in | Wt 335.0 lb

## 2018-11-12 DIAGNOSIS — R5382 Chronic fatigue, unspecified: Secondary | ICD-10-CM

## 2018-11-12 DIAGNOSIS — M159 Polyosteoarthritis, unspecified: Secondary | ICD-10-CM | POA: Diagnosis not present

## 2018-11-12 DIAGNOSIS — Z23 Encounter for immunization: Secondary | ICD-10-CM | POA: Diagnosis not present

## 2018-11-12 DIAGNOSIS — E538 Deficiency of other specified B group vitamins: Secondary | ICD-10-CM

## 2018-11-12 DIAGNOSIS — G9332 Myalgic encephalomyelitis/chronic fatigue syndrome: Secondary | ICD-10-CM

## 2018-11-12 DIAGNOSIS — M2550 Pain in unspecified joint: Secondary | ICD-10-CM | POA: Diagnosis not present

## 2018-11-12 DIAGNOSIS — Z6841 Body Mass Index (BMI) 40.0 and over, adult: Secondary | ICD-10-CM

## 2018-11-12 LAB — COMPREHENSIVE METABOLIC PANEL
ALT: 19 U/L (ref 0–35)
AST: 15 U/L (ref 0–37)
Albumin: 4.3 g/dL (ref 3.5–5.2)
Alkaline Phosphatase: 70 U/L (ref 39–117)
BUN: 15 mg/dL (ref 6–23)
CO2: 31 mEq/L (ref 19–32)
Calcium: 9.7 mg/dL (ref 8.4–10.5)
Chloride: 101 mEq/L (ref 96–112)
Creatinine, Ser: 1.1 mg/dL (ref 0.40–1.20)
GFR: 51.89 mL/min — ABNORMAL LOW (ref >60.00)
Glucose, Bld: 201 mg/dL — ABNORMAL HIGH (ref 70–99)
Potassium: 4.7 mEq/L (ref 3.5–5.1)
Sodium: 140 mEq/L (ref 135–145)
Total Bilirubin: 0.4 mg/dL (ref 0.2–1.2)
Total Protein: 6.9 g/dL (ref 6.0–8.3)

## 2018-11-12 LAB — CBC WITH DIFFERENTIAL/PLATELET
Basophils Absolute: 0 10*3/uL (ref 0.0–0.1)
Basophils Relative: 0.4 % (ref 0.0–3.0)
Eosinophils Absolute: 0.1 10*3/uL (ref 0.0–0.7)
Eosinophils Relative: 1.9 % (ref 0.0–5.0)
HCT: 44.8 % (ref 36.0–46.0)
Hemoglobin: 14.8 g/dL (ref 12.0–15.0)
Lymphocytes Relative: 21.2 % (ref 12.0–46.0)
Lymphs Abs: 1.5 10*3/uL (ref 0.7–4.0)
MCHC: 33 g/dL (ref 30.0–36.0)
MCV: 98.6 fl (ref 78.0–100.0)
Monocytes Absolute: 0.4 10*3/uL (ref 0.1–1.0)
Monocytes Relative: 6.2 % (ref 3.0–12.0)
Neutro Abs: 4.8 10*3/uL (ref 1.4–7.7)
Neutrophils Relative %: 70.3 % (ref 43.0–77.0)
Platelets: 229 10*3/uL (ref 150.0–400.0)
RBC: 4.54 Mil/uL (ref 3.87–5.11)
RDW: 14.6 % (ref 11.5–15.5)
WBC: 6.9 10*3/uL (ref 4.0–10.5)

## 2018-11-12 LAB — TSH: TSH: 1.8 u[IU]/mL (ref 0.35–4.50)

## 2018-11-12 LAB — SEDIMENTATION RATE: Sed Rate: 31 mm/hr — ABNORMAL HIGH (ref 0–30)

## 2018-11-12 LAB — VITAMIN B12: Vitamin B-12: 182 pg/mL — ABNORMAL LOW (ref 211–911)

## 2018-11-12 NOTE — Patient Instructions (Addendum)
Keep working on [employer] loss  Labs today for joint auto immune disease and also polymyalgia  Here is some info on fibromyalgia   Also some labs for fatigue

## 2018-11-12 NOTE — Telephone Encounter (Signed)
Thanks for letting me know I want to see how labs are before recommending tx

## 2018-11-12 NOTE — Assessment & Plan Note (Signed)
Likely multifactorial but worse lately  H/o fibromyalgia in the past  H/o OA  No joint deformities seen Also hurts in shoulder girdle Check ESR -to r/o PMR Also rheumatoid panel with RF/ ANA Also lyme ab (had tick bite this summer) Plan to follow   

## 2018-11-12 NOTE — Assessment & Plan Note (Signed)
Ongoing but seems worse lately in setting of worse joint /muscle pain  Has h/o fibromyalgia and depression  H/o low B12-took shots in distant past Check this and cbc/cmet/tsh today

## 2018-11-12 NOTE — Progress Notes (Signed)
Subjective:    Patient ID: Mackenzie Bonilla, female    DOB: 01-Sep-1965, 53 y.o.   MRN: [id number]  HPI Pt presents with new joint pain and ongoing fatigue   Also fatigue   Wt Readings from Last 3 Encounters:  11/12/18 (!) 335 lb (152 kg)  10/13/18 (!) 333 lb 2 oz (151.1 kg)  10/09/18 (!) 333 lb (151 kg)   57.50 kg/m  Attempting weight watchers  Less energy when she sticks with it   New joint pain started about a month ago  Noted with her new car-every time she gets in the seat has to adjust and it makes her legs hurt   L leg is sore and tender to the touch Sharp pain to raise it - from foot to back of leg and thigh to her buttock  Her foot also falls asleep easily  Most comfortable to keep knee slightly bent   Went to chiropractor last week  Did adjustment of her ankle - and it helped   Fingers stay really cold  Very stiff in the morning-hard to use  Warming them up helps- hot water for instance  occ hurts to hold her pen   Neck and shoulders stay sore all the time  Massage helps a bit  Hurts to raise arms above head -but can do it    Wrists are also stiff and sore 75 % of the time  Elbows occ hurt -less often   There are days when everything aches all over even at rest    Mobility impaired  In handicapped apartment    No joint swelling  No joint redness or warmth   Continues depression tx  Not doing counseling   Changed from trulicity to ozempic for DM  Getting a meter soon   Lab Results  Component Value Date   VITAMINB12 247 08/11/2013  does not take vitamin B12  Takes vitamin D -endocrine watches   Took shots for B12 distant past   Had tick bite this summer as well  Never had fever   Was diagnosed with fibromyalgia in the past -Dr Jefm Bryant (rheumatol)   fam hx of psoriatic arthritis  She does not have psoriasis   Patient Active Problem List   Diagnosis Date Noted  . Multiple joint pain 11/12/2018  . UTI (urinary tract infection)  04/08/2018  . Eating disorder 05/23/2017  . Gallstones 04/19/2017  . Diabetic polyneuropathy associated with type 2 diabetes mellitus ([place]) 04/07/2017  . Diabetic angiopathy ([place]) 04/07/2017  . Vasomotor symptoms due to menopause 04/07/2017  . Mobility impaired 11/12/2016  . Urinary incontinence 12/02/2015  . Candidal intertrigo 04/06/2015  . Cystocele 04/06/2015  . Constipation 04/06/2015  . Low back pain 12/28/2014  . Hypertriglyceridemia 10/14/2014  . Breast mass in female 07/28/2014  . Fall 07/23/2013  . Great toe pain 05/12/2013  . Medication management 11/20/2012  . Uric acid kidney stone 11/20/2011  . Hematuria 08/09/2010  . HYPERHIDROSIS 11/07/2009  . Menopausal symptoms 06/17/2009  . Type 2 diabetes mellitus without complication, without long-term current use of insulin ([place]) 08/23/2008  . VITAMIN D DEFICIENCY 05/07/2008  . Vitamin B 12 deficiency 06/04/2007  . CHRONIC FATIGUE SYNDROME 11/15/2006  . DIVERTICULITIS, HX OF 11/15/2006  . PCOS (polycystic ovarian syndrome) 10/16/2006  . Hyperlipidemia LDL goal <100 10/16/2006  . Morbid obesity with BMI of 50.0-59.9, adult ([place]) 10/16/2006  . Generalized anxiety disorder 10/16/2006  . PANIC DISORDER 10/16/2006  . BULIMIA 10/16/2006  . Chronic depression 10/16/2006  .  CARPAL TUNNEL SYNDROME 10/16/2006  . LYMPHEDEMA 10/16/2006  . Allergic rhinitis 10/16/2006  . Mild intermittent asthma 10/16/2006  . TMJ SYNDROME 10/16/2006  . GERD 10/16/2006  . IRRITABLE BOWEL SYNDROME 10/16/2006  . Osteoarthritis 10/16/2006  . COUGH, CHRONIC 10/16/2006   Past Medical History:  Diagnosis Date  . Allergy    allergic rhinitis  . Anemia   . Anxiety   . Arthritis    osteoarthritis  . Asthma   . Claustrophobia    does not like oxygen mask covering face  . Depression   . Diabetes mellitus without complication ([place])   . Fall 2016  . Fatty liver 07/2008   abd. ultrasound - fatty liver ; slt dilated cbd (no stones ) 06/10// abd.   ultrasound normal on 04/2006  . Fibromyalgia   . GERD (gastroesophageal reflux disease)    EGD negative 06/2001// EGD erythematous mucosa, polyp 08/2008  . High uric acid in 24 hour urine specimen   . Hyperhidrosis   . Hyperlipidemia   . Kidney stones   . Lymphedema   . Obesity   . Shortness of breath dyspnea   . Tachycardia   . TMJ (dislocation of temporomandibular joint)   . UTI (lower urinary tract infection)    Past Surgical History:  Procedure Laterality Date  . BREAST BIOPSY Right 2016   neg  . BREAST LUMPECTOMY WITH RADIOACTIVE SEED LOCALIZATION Right 08/02/2014   Procedure: BREAST LUMPECTOMY WITH RADIOACTIVE SEED LOCALIZATION;  Surgeon: Rolm Bookbinder, MD;  Location: [facility];  Service: General;  Laterality: Right;  . COLONOSCOPY  08/12/2012   Completed by Dr. Phylis Bougie, normal exam.   . ENDOMETRIAL BIOPSY  01/2004  . ESOPHAGOGASTRODUODENOSCOPY    . FOOT SURGERY    . SEPTOPLASTY     two times  . TONSILLECTOMY    . UTERINE FIBROID SURGERY  06/2005   ablation  . uterine tumor  09/2001  . VAGUS NERVE STIMULATOR INSERTION     Social History   Tobacco Use  . Smoking status: Never Smoker  . Smokeless tobacco: Never Used  Substance Use Topics  . Alcohol use: No    Alcohol/week: 0.0 standard drinks  . Drug use: No   Family History  Problem Relation Age of Onset  . Hypertension Father   . Cancer Father        pancreatic cancer  . Hypertension Sister   . Heart disease Maternal Grandmother 60       MI  . Diabetes Maternal Grandmother   . Heart disease Paternal Grandmother 24       MI  . Diabetes Paternal Grandmother   . Breast cancer Neg Hx    Allergies  Allergen Reactions  . Cephalexin   . Codeine     REACTION: ? reaction  . Duloxetine   . Levaquin [Levofloxacin] Nausea Only  . Lisinopril Cough  . Metformin And Related Diarrhea    GI side effects   . Prednisone     Tolerates intraarticular depo-medrol.  . Quetiapine Other (See Comments)  . Sertraline  Hcl     REACTION: vivid dreams  . Simvastatin Other (See Comments)    achey  . Triazolam     REACTION: ? reaction  . Zaleplon   . Zocor [Simvastatin - High Dose]     achey  . Zolpidem Tartrate     hallucinations  . Hydrocodone Rash    REACTION: ? reaction REACTION: ? reaction  . Norelgestromin-Eth Estradiol     Patch didn't stick  to skin   Current Outpatient Medications on File Prior to Visit  Medication Sig Dispense Refill  . ACCU-CHEK AVIVA PLUS test strip Use to check blood sugar  once a day 100 each 0  . ACCU-CHEK SOFTCLIX LANCETS lancets Use to check blood sugar  once a day 100 each 0  . acetaminophen (TYLENOL) 500 MG tablet Take 500 mg by mouth every 6 (six) hours as needed.     Marland Kitchen albuterol (VENTOLIN HFA) 108 (90 Base) MCG/ACT inhaler Inhale 2 puffs into the lungs every 6 (six) hours as needed for wheezing. 1 Inhaler 1  . ALPRAZolam (XANAX) 1 MG tablet Take 2 mg by mouth 3 (three) times daily as needed.     . Blood Glucose Calibration (ACCU-CHEK AVIVA) SOLN     . Cetirizine HCl 10 MG CAPS Take 1 capsule by mouth daily.    . cyclobenzaprine (FLEXERIL) 5 MG tablet TAKE ONE TABLET THREE TIMES A DAY IF NEEDED FOR MUSCLE SPASM 30 tablet 3  . desvenlafaxine (PRISTIQ) 100 MG 24 hr tablet     . dimenhyDRINATE (DRAMAMINE) 50 MG tablet Take 50 mg by mouth every 8 (eight) hours as needed.    . gabapentin (NEURONTIN) 100 MG capsule Take 1 capsule (100 mg total) by mouth 2 (two) times daily. 360 capsule 3  . gabapentin (NEURONTIN) 300 MG capsule Take 1 capsule (300 mg total) by mouth at bedtime. (Patient taking differently: Take 300 mg by mouth 4 (four) times daily. ) 90 capsule 3  . glipiZIDE (GLUCOTROL XL) 5 MG 24 hr tablet Take 5 mg by mouth daily with breakfast.    . ibuprofen (ADVIL,MOTRIN) 600 MG tablet Take 1 tablet (600 mg total) by mouth every 8 (eight) hours as needed. 30 tablet 0  . ketoconazole (NIZORAL) 2 % cream Apply 1 application topically daily as needed for irritation. For  yeast rash to affected area 30 g 3  . loratadine (CLARITIN) 10 MG tablet Take by mouth.    . losartan (COZAAR) 25 MG tablet     . nystatin (MYCOSTATIN/NYSTOP) powder Apply topically 3 (three) times daily. To affected areas of yeast rash 60 g 3  . omeprazole (PRILOSEC) 20 MG capsule TAKE 1 CAPSULE BY MOUTH TWICE DAILY 180 capsule 1  . oxybutynin (DITROPAN XL) 15 MG 24 hr tablet Take 1 tablet (15 mg total) by mouth daily. 90 tablet 1  . Polyethyl Glycol-Propyl Glycol (SYSTANE OP) Apply 1 drop to eye daily as needed.    . pravastatin (PRAVACHOL) 20 MG tablet TAKE 1 TABLET BY MOUTH NIGHTLY 30 tablet 5  . Semaglutide, 1 MG/DOSE, (OZEMPIC, 1 MG/DOSE,) 2 MG/1.5ML SOPN Inject 0.75 mLs into the skin once a week. Inject 0.75 ml into the skin once a wk on Wednesdays.    . Simethicone (GAS-X PO) Take by mouth.    . spironolactone (ALDACTONE) 100 MG tablet TAKE ONE TABLET BY MOUTH EVERY DAY 30 tablet 3  . traMADol (ULTRAM) 50 MG tablet Take 1 tablet (50 mg total) by mouth 2 (two) times daily as needed for moderate pain or severe pain. 30 tablet 0  . traZODone (DESYREL) 150 MG tablet Take 300 mg by mouth at bedtime.     . triamcinolone cream (KENALOG) 0.1 %      No current facility-administered medications on file prior to visit.      Review of Systems  Constitutional: Positive for fatigue. Negative for activity change, appetite change, fever and unexpected weight change.  HENT: Negative for congestion, ear  pain, rhinorrhea, sinus pressure and sore throat.   Eyes: Negative for pain, redness and visual disturbance.  Respiratory: Negative for cough, shortness of breath and wheezing.   Cardiovascular: Negative for chest pain and palpitations.  Gastrointestinal: Positive for abdominal pain and nausea. Negative for blood in stool, constipation and diarrhea.       Chronic GI problems -nausea and abd pain with many food intolerances  Endocrine: Negative for polydipsia and polyuria.  Genitourinary: Negative for  dysuria, frequency and urgency.  Musculoskeletal: Positive for arthralgias, back pain, gait problem, myalgias and neck pain. Negative for joint swelling.  Skin: Negative for pallor and rash.  Allergic/Immunologic: Negative for environmental allergies.  Neurological: Negative for dizziness, syncope and headaches.  Hematological: Negative for adenopathy. Does not bruise/bleed easily.  Psychiatric/Behavioral: Positive for dysphoric mood. Negative for decreased concentration and self-injury. The patient is nervous/anxious.        Objective:   Physical Exam Constitutional:      General: She is not in acute distress.    Appearance: Normal appearance. She is well-developed. She is obese. She is not ill-appearing or diaphoretic.     Comments: Super morbidly obese  HENT:     Head: Normocephalic and atraumatic.     Mouth/Throat:     Mouth: Mucous membranes are moist.     Pharynx: Oropharynx is clear.  Eyes:     General: No scleral icterus.       Right eye: No discharge.        Left eye: No discharge.     Conjunctiva/sclera: Conjunctivae normal.     Pupils: Pupils are equal, round, and reactive to light.  Neck:     Musculoskeletal: Normal range of motion and neck supple. No neck rigidity.     Thyroid: No thyromegaly.     Vascular: No carotid bruit or JVD.  Cardiovascular:     Rate and Rhythm: Regular rhythm. Tachycardia present.     Heart sounds: Normal heart sounds. No gallop.   Pulmonary:     Effort: Pulmonary effort is normal. No respiratory distress.     Breath sounds: Normal breath sounds. No wheezing or rales.  Abdominal:     General: Bowel sounds are normal. There is no abdominal bruit.     Palpations: Abdomen is soft. There is no mass.  Musculoskeletal:     Comments: Tenderness in MCP joints and dorsal wrists - pain with flexion of wrists and supination No elbow tenderness Knee and ankle tenderness bilaterally  Myofacial trigger points throughout shoulder girdle and back    Moves slowly due to pain  No joint deformity/swelling/erythema or warmth noted  Lymphadenopathy:     Cervical: No cervical adenopathy.  Skin:    General: Skin is warm and dry.     Coloration: Skin is not jaundiced.     Findings: No erythema or rash.  Neurological:     Mental Status: She is alert.     Cranial Nerves: No cranial nerve deficit.     Sensory: No sensory deficit.     Coordination: Coordination normal.     Deep Tendon Reflexes: Reflexes are normal and symmetric.  Psychiatric:        Mood and Affect: Mood is anxious and depressed.           Assessment & Plan:   Problem List Items Addressed This Visit      Nervous and Auditory   CHRONIC FATIGUE SYNDROME    Ongoing but seems worse lately in setting of worse  joint /muscle pain  Has h/o fibromyalgia and depression  H/o low B12-took shots in distant past Check this and cbc/cmet/tsh today      Relevant Orders   CBC with Differential/Platelet   Comprehensive metabolic panel   TSH   Vitamin B12   B. burgdorfi antibodies by WB     Musculoskeletal and Integument   Osteoarthritis    Pt thinks in her knees Worse pain lately        Other   Vitamin B 12 deficiency    Level today  Took shots remotely  No oral supplementation now       Morbid obesity with BMI of 50.0-59.9, adult ([facility])    Discussed how this problem influences overall health and the risks it imposes  Reviewed plan for weight loss with lower calorie diet (via better food choices and also portion control or program like weight watchers) and exercise building up to or more than 30 minutes 5 days per week including some aerobic activity   She is mobility impaired  Fatigue/joint pain may be linked to obesity      Multiple joint pain - Primary    Likely multifactorial but worse lately  H/o fibromyalgia in the past  H/o OA  No joint deformities seen Also hurts in shoulder girdle Check ESR -to r/o PMR Also rheumatoid panel with RF/ ANA Also  lyme ab (had tick bite this summer) Plan to follow        Relevant Orders   Rheumatoid factor   ANA   Sedimentation Rate   B. burgdorfi antibodies by WB    Other Visit Diagnoses    Need for influenza vaccination       Relevant Orders   Flu Vaccine QUAD 6+ mos PF IM (Fluarix Quad PF) (Completed)

## 2018-11-12 NOTE — Assessment & Plan Note (Signed)
Level today  Took shots remotely  No oral supplementation now

## 2018-11-12 NOTE — Telephone Encounter (Signed)
Patient stated that her feet have started cramping. And her arms and hands are real aching. She stated her jaws are started to ache also and get tight again.  She stated that she has been prescribed medication before for this to help/   Patient stated that this morning at her appointment these things where not going on but she did run 2 places after the appointment and when getting back home all of this has started .

## 2018-11-12 NOTE — Assessment & Plan Note (Signed)
Pt thinks in her knees Worse pain lately

## 2018-11-12 NOTE — Assessment & Plan Note (Signed)
Discussed how this problem influences overall health and the risks it imposes  Reviewed plan for weight loss with lower calorie diet (via better food choices and also portion control or program like weight watchers) and exercise building up to or more than 30 minutes 5 days per week including some aerobic activity   She is mobility impaired  Fatigue/joint pain may be linked to obesity

## 2018-11-13 ENCOUNTER — Other Ambulatory Visit: Payer: Self-pay

## 2018-11-13 ENCOUNTER — Telehealth: Payer: Self-pay | Admitting: Family Medicine

## 2018-11-13 DIAGNOSIS — Z20822 Contact with and (suspected) exposure to covid-19: Secondary | ICD-10-CM

## 2018-11-13 MED ORDER — CYANOCOBALAMIN 1000 MCG/ML IJ KIT: 1000.0000 ug | PACK | INTRAMUSCULAR | 11 refills | Status: DC

## 2018-11-13 NOTE — Telephone Encounter (Signed)
Pt notified of Dr. Marliss Coots recommendations. At 1st pt refused all options she said she just needs to take her allergy med. I advise pt that Dr. Glori Bickers thinks it's a good idea to at least go get tested. Pt said that she just knows that it's allergies because she kept the air on last night and she would go from hot to having chills. I did advise pt that chills was also a sxs of covid. Pt ask me what are the other sxs. I advise pt of all of the sxs of covid and she said she also has been having GI issues. After hearing that pt did agree to at least go get tested she said the [facility] testing site is 5 min from her house so she will go get tested. Pt advise to isolate until we get the results back.  Sent back to Dr. Glori Bickers to send in B12 inj to [facility]

## 2018-11-13 NOTE — Telephone Encounter (Signed)
Pt advised of Dr. Marliss Coots comments. Pt asked if she can just take B12 tablets. I did advise her that sometimes the tablets don't absorb as well as the shots. Pt asked that Dr. Glori Bickers send in Rx for B12 inj to [facility] so she can have it done there pt said that the pharmacist has given her B12 shots in the past and the pharmacy is closer to her home.  Pt also said that she wanted Dr. Marliss Coots opinion on her message she left yesterday she said all of her sxs left in message have worsened today lately:    Patient stated that her feet have started cramping. And her arms and hands are real aching. She stated her jaws are started to ache also and get tight again. She stated that she has been prescribed medication before for this to help/  Patient stated that this morning at her appointment these things where not going on but she did run 2 places after the appointment and when getting back home all of this has started   Also pt said that she had congestion yesterday that she knows it was allergies but now the mucus is so think that she has to "pull it out her throat with a toothbrush" and that she had SOB and trouble breathing due to the congestion" also pt said that she is "running out of food" but doesn't want to go to the store or go out anymore

## 2018-11-13 NOTE — Telephone Encounter (Signed)
I [facility] to [facility]  Let me know if any issues

## 2018-11-13 NOTE — Telephone Encounter (Signed)
Patient wanted you to be aware that she did go have her covid test done

## 2018-11-13 NOTE — Telephone Encounter (Signed)
Some labs are back  Glucose is high  Sed rate is ok at 31  Rheumatoid factor is normal   Vitamin B12 is low  I want her to get 4 B12 shots weekly for 4 weeks and then monthly  Re check B12 in 3 months  This should help a lot She has mobility issues- if she could do B12 shots outside that would be optimal  Still pending ANA and lyme tests  Will let her know when they return

## 2018-11-13 NOTE — Telephone Encounter (Signed)
If short of breath she should go to ED or UC to be seen (we cannot see her here)  If she refuses to do that-give her an address for drive through covid screening  She needs to isolate until symptoms resolve   Send this back and I will send in the B12

## 2018-11-13 NOTE — Telephone Encounter (Signed)
Aware, thanks  Will watch out for results

## 2018-11-14 ENCOUNTER — Telehealth: Payer: Self-pay | Admitting: *Deleted

## 2018-11-14 LAB — NOVEL CORONAVIRUS, NAA: SARS-CoV-2, NAA: NOT DETECTED

## 2018-11-14 LAB — B. BURGDORFI ANTIBODIES BY WB

## 2018-11-14 LAB — RHEUMATOID FACTOR: Rheumatoid fact SerPl-aCnc: <14 IU/mL (ref <14)

## 2018-11-14 LAB — ANA: Anti Nuclear Antibody (ANA): NEGATIVE

## 2018-11-14 NOTE — Telephone Encounter (Signed)
Patient called stating that she went for her covid testing yesterday. Patient stated that two different nurses told her different things about her quarantining herself. Advised patient that we advise that she quarantine herself until she gets her results back and she verbalized understanding. Advised patient that she will be contacted with her results and will be informed how to proceed with quarantine if necessary. Patient stated that she has two appointments next week and wants to know if she should reschedule them. Advised patient that she should not plan anything for next week until her results are back.

## 2018-11-16 ENCOUNTER — Encounter: Payer: Self-pay | Admitting: Family Medicine

## 2018-11-20 ENCOUNTER — Encounter: Payer: Self-pay | Admitting: Family Medicine

## 2018-11-21 NOTE — Telephone Encounter (Signed)
Please sign and close encounter if completed.  

## 2018-11-21 NOTE — Telephone Encounter (Signed)
Patient called back to make sure that you have received her my chart message, she has not heard back from anyone

## 2018-11-28 ENCOUNTER — Ambulatory Visit: Payer: Medicare Other | Admitting: Family Medicine

## 2018-11-28 NOTE — Telephone Encounter (Signed)
Can you please check and see if this encounter can be signed and closed?

## 2018-12-01 ENCOUNTER — Ambulatory Visit: Payer: Medicare Other | Admitting: Family Medicine

## 2018-12-05 ENCOUNTER — Telehealth: Payer: Self-pay

## 2018-12-05 NOTE — Telephone Encounter (Signed)
Spoke with patient and scheduled for 12/08/2018 with Dr Glori Bickers

## 2018-12-05 NOTE — Telephone Encounter (Signed)
(  Dr Marliss Coots patient)  Patient left message on triage voicemail stating that she was in a lot of pain with her joints (see several office notes and message about this in epic). She stated she saw chiropractor and was told by him that they thought is was sciatic nerve issues. She is having pain in her back, buttocks area, left leg and left foot, some numbness in the leg and foot. She is not able to rest at night well. Has taking Ibuprofen with not much of relief. Patient asking for advice on what to do at this time, what to take? Appointment needed?

## 2018-12-05 NOTE — Telephone Encounter (Signed)
She should have an appointment to evaluate these specific concerns and possibly xray.

## 2018-12-08 ENCOUNTER — Encounter: Payer: Self-pay | Admitting: Family Medicine

## 2018-12-08 ENCOUNTER — Other Ambulatory Visit: Payer: Self-pay

## 2018-12-08 ENCOUNTER — Ambulatory Visit (INDEPENDENT_AMBULATORY_CARE_PROVIDER_SITE_OTHER): Payer: Medicare Other | Admitting: Family Medicine

## 2018-12-08 VITALS — BP 124/76 | HR 92 | Temp 97.4°F | Ht 64.0 in | Wt 339.5 lb

## 2018-12-08 DIAGNOSIS — G8929 Other chronic pain: Secondary | ICD-10-CM | POA: Diagnosis not present

## 2018-12-08 DIAGNOSIS — M545 Low back pain: Secondary | ICD-10-CM

## 2018-12-08 NOTE — Progress Notes (Signed)
Subjective:    Patient ID: Mackenzie Bonilla, female    DOB: 12-08-1965, 53 y.o.   MRN: [id number]  HPI Here for leg/hip and back pain   L side -acute on chronic  Sees chiropractor- has pain right after adjustment and then it calms down Noted some tenderness on spine last week- that was new   Moving makes it worse Cannot get a whole night sleep  Pain in L low back/buttock/ext hip and lateral L foot  Thinks it may be sciatic nerve  Not numb but her foot sometimes "goes to sleep"  No foot drop (she tends to not lift feet well to begin with)   Worsened by bending forward  Has trouble lifting water bottles out of her count  Hard to get her trash out  Standing hurts also-can stand shorter periods of time   Of note-symptoms worsened when she got a new car-just cannot adjust seat correctly   Takes ibuprofen-helps a little - makes her sleepy    Wt Readings from Last 3 Encounters:  12/08/18 (!) 339 lb 8 oz (154 kg)  11/12/18 (!) 335 lb (152 kg)  10/13/18 (!) 333 lb 2 oz (151.1 kg)   58.28 kg/m   Last LS xray was 2016-normal  She has used heat/cold  Heat feels good on back (seat warmer)   Patient Active Problem List   Diagnosis Date Noted  . Multiple joint pain 11/12/2018  . UTI (urinary tract infection) 04/08/2018  . Eating disorder 05/23/2017  . Gallstones 04/19/2017  . Diabetic polyneuropathy associated with type 2 diabetes mellitus ([place]) 04/07/2017  . Diabetic angiopathy ([place]) 04/07/2017  . Vasomotor symptoms due to menopause 04/07/2017  . Mobility impaired 11/12/2016  . Urinary incontinence 12/02/2015  . Candidal intertrigo 04/06/2015  . Cystocele 04/06/2015  . Constipation 04/06/2015  . Low back pain 12/28/2014  . Hypertriglyceridemia 10/14/2014  . Breast mass in female 07/28/2014  . Fall 07/23/2013  . Great toe pain 05/12/2013  . Medication management 11/20/2012  . Uric acid kidney stone 11/20/2011  . Hematuria 08/09/2010  . HYPERHIDROSIS 11/07/2009  .  Menopausal symptoms 06/17/2009  . Type 2 diabetes mellitus without complication, without long-term current use of insulin ([place]) 08/23/2008  . VITAMIN D DEFICIENCY 05/07/2008  . Vitamin B 12 deficiency 06/04/2007  . CHRONIC FATIGUE SYNDROME 11/15/2006  . DIVERTICULITIS, HX OF 11/15/2006  . PCOS (polycystic ovarian syndrome) 10/16/2006  . Hyperlipidemia LDL goal <100 10/16/2006  . Morbid obesity with BMI of 50.0-59.9, adult ([place]) 10/16/2006  . Generalized anxiety disorder 10/16/2006  . PANIC DISORDER 10/16/2006  . BULIMIA 10/16/2006  . Chronic depression 10/16/2006  . CARPAL TUNNEL SYNDROME 10/16/2006  . LYMPHEDEMA 10/16/2006  . Allergic rhinitis 10/16/2006  . Mild intermittent asthma 10/16/2006  . TMJ SYNDROME 10/16/2006  . GERD 10/16/2006  . IRRITABLE BOWEL SYNDROME 10/16/2006  . Osteoarthritis 10/16/2006  . COUGH, CHRONIC 10/16/2006   Past Medical History:  Diagnosis Date  . Allergy    allergic rhinitis  . Anemia   . Anxiety   . Arthritis    osteoarthritis  . Asthma   . Claustrophobia    does not like oxygen mask covering face  . Depression   . Diabetes mellitus without complication ([place])   . Fall 2016  . Fatty liver 07/2008   abd. ultrasound - fatty liver ; slt dilated cbd (no stones ) 06/10// abd.  ultrasound normal on 04/2006  . Fibromyalgia   . GERD (gastroesophageal reflux disease)    EGD negative  06/2001// EGD erythematous mucosa, polyp 08/2008  . High uric acid in 24 hour urine specimen   . Hyperhidrosis   . Hyperlipidemia   . Kidney stones   . Lymphedema   . Obesity   . Shortness of breath dyspnea   . Tachycardia   . TMJ (dislocation of temporomandibular joint)   . UTI (lower urinary tract infection)    Past Surgical History:  Procedure Laterality Date  . BREAST BIOPSY Right 2016   neg  . BREAST LUMPECTOMY WITH RADIOACTIVE SEED LOCALIZATION Right 08/02/2014   Procedure: BREAST LUMPECTOMY WITH RADIOACTIVE SEED LOCALIZATION;  Surgeon: Rolm Bookbinder,  MD;  Location: [facility];  Service: General;  Laterality: Right;  . COLONOSCOPY  08/12/2012   Completed by Dr. Phylis Bougie, normal exam.   . ENDOMETRIAL BIOPSY  01/2004  . ESOPHAGOGASTRODUODENOSCOPY    . FOOT SURGERY    . SEPTOPLASTY     two times  . TONSILLECTOMY    . UTERINE FIBROID SURGERY  06/2005   ablation  . uterine tumor  09/2001  . VAGUS NERVE STIMULATOR INSERTION     Social History   Tobacco Use  . Smoking status: Never Smoker  . Smokeless tobacco: Never Used  Substance Use Topics  . Alcohol use: No    Alcohol/week: 0.0 standard drinks  . Drug use: No   Family History  Problem Relation Age of Onset  . Hypertension Father   . Cancer Father        pancreatic cancer  . Hypertension Sister   . Heart disease Maternal Grandmother 60       MI  . Diabetes Maternal Grandmother   . Heart disease Paternal Grandmother 67       MI  . Diabetes Paternal Grandmother   . Breast cancer Neg Hx    Allergies  Allergen Reactions  . Cephalexin   . Codeine     REACTION: ? reaction  . Duloxetine   . Levaquin [Levofloxacin] Nausea Only  . Lisinopril Cough  . Metformin And Related Diarrhea    GI side effects   . Prednisone     Tolerates intraarticular depo-medrol.  . Quetiapine Other (See Comments)  . Sertraline Hcl     REACTION: vivid dreams  . Simvastatin Other (See Comments)    achey  . Triazolam     REACTION: ? reaction  . Zaleplon   . Zocor [Simvastatin - High Dose]     achey  . Zolpidem Tartrate     hallucinations  . Hydrocodone Rash    REACTION: ? reaction REACTION: ? reaction  . Norelgestromin-Eth Estradiol     Patch didn't stick to skin   Current Outpatient Medications on File Prior to Visit  Medication Sig Dispense Refill  . ACCU-CHEK AVIVA PLUS test strip Use to check blood sugar  once a day 100 each 0  . ACCU-CHEK SOFTCLIX LANCETS lancets Use to check blood sugar  once a day 100 each 0  . acetaminophen (TYLENOL) 500 MG tablet Take 500 mg by mouth every 6  (six) hours as needed.     Marland Kitchen albuterol (VENTOLIN HFA) 108 (90 Base) MCG/ACT inhaler Inhale 2 puffs into the lungs every 6 (six) hours as needed for wheezing. 1 Inhaler 1  . ALPRAZolam (XANAX) 1 MG tablet Take 2 mg by mouth 3 (three) times daily as needed.     . Blood Glucose Calibration (ACCU-CHEK AVIVA) SOLN     . Cetirizine HCl 10 MG CAPS Take 1 capsule by mouth daily.    Marland Kitchen  Cyanocobalamin 1000 MCG/ML KIT Inject 1,000 mcg as directed once a week. For 4 weeks, then inject once monthly, please administer at the pharmacy 1 kit 11  . cyclobenzaprine (FLEXERIL) 5 MG tablet TAKE ONE TABLET THREE TIMES A DAY IF NEEDED FOR MUSCLE SPASM 30 tablet 3  . desvenlafaxine (PRISTIQ) 100 MG 24 hr tablet     . dimenhyDRINATE (DRAMAMINE) 50 MG tablet Take 50 mg by mouth every 8 (eight) hours as needed.    . gabapentin (NEURONTIN) 100 MG capsule Take 1 capsule (100 mg total) by mouth 2 (two) times daily. 360 capsule 3  . gabapentin (NEURONTIN) 300 MG capsule Take 1 capsule (300 mg total) by mouth at bedtime. (Patient taking differently: Take 300 mg by mouth 4 (four) times daily. ) 90 capsule 3  . glipiZIDE (GLUCOTROL XL) 5 MG 24 hr tablet Take 5 mg by mouth daily with breakfast.    . ibuprofen (ADVIL,MOTRIN) 600 MG tablet Take 1 tablet (600 mg total) by mouth every 8 (eight) hours as needed. 30 tablet 0  . ketoconazole (NIZORAL) 2 % cream Apply 1 application topically daily as needed for irritation. For yeast rash to affected area 30 g 3  . loratadine (CLARITIN) 10 MG tablet Take by mouth.    . losartan (COZAAR) 25 MG tablet     . nystatin (MYCOSTATIN/NYSTOP) powder Apply topically 3 (three) times daily. To affected areas of yeast rash 60 g 3  . omeprazole (PRILOSEC) 20 MG capsule TAKE 1 CAPSULE BY MOUTH TWICE DAILY 180 capsule 1  . oxybutynin (DITROPAN XL) 15 MG 24 hr tablet Take 1 tablet (15 mg total) by mouth daily. 90 tablet 1  . Polyethyl Glycol-Propyl Glycol (SYSTANE OP) Apply 1 drop to eye daily as needed.     . pravastatin (PRAVACHOL) 20 MG tablet TAKE 1 TABLET BY MOUTH NIGHTLY 30 tablet 5  . Semaglutide, 1 MG/DOSE, (OZEMPIC, 1 MG/DOSE,) 2 MG/1.5ML SOPN Inject 0.75 mLs into the skin once a week. Inject 0.75 ml into the skin once a wk on Wednesdays.    . Simethicone (GAS-X PO) Take by mouth.    . spironolactone (ALDACTONE) 100 MG tablet TAKE ONE TABLET BY MOUTH EVERY DAY 30 tablet 3  . traMADol (ULTRAM) 50 MG tablet Take 1 tablet (50 mg total) by mouth 2 (two) times daily as needed for moderate pain or severe pain. 30 tablet 0  . traZODone (DESYREL) 150 MG tablet Take 300 mg by mouth at bedtime.     . triamcinolone cream (KENALOG) 0.1 %      No current facility-administered medications on file prior to visit.     Review of Systems  Constitutional: Positive for fatigue. Negative for activity change, appetite change, fever and unexpected weight change.  HENT: Negative for congestion, ear pain, rhinorrhea, sinus pressure and sore throat.   Eyes: Negative for pain, redness and visual disturbance.  Respiratory: Negative for cough, shortness of breath and wheezing.   Cardiovascular: Negative for chest pain and palpitations.  Gastrointestinal: Negative for abdominal pain, blood in stool, constipation and diarrhea.  Endocrine: Negative for polydipsia and polyuria.  Genitourinary: Negative for dysuria, frequency and urgency.  Musculoskeletal: Positive for arthralgias and back pain. Negative for myalgias.  Skin: Negative for pallor and rash.  Allergic/Immunologic: Negative for environmental allergies.  Neurological: Negative for dizziness, syncope and headaches.  Hematological: Negative for adenopathy. Does not bruise/bleed easily.  Psychiatric/Behavioral: Negative for decreased concentration and dysphoric mood. The patient is not nervous/anxious.  Objective:   Physical Exam Constitutional:      General: She is not in acute distress.    Appearance: Normal appearance. She is well-developed. She  is obese. She is not ill-appearing.  HENT:     Head: Normocephalic and atraumatic.  Eyes:     General: No scleral icterus.    Conjunctiva/sclera: Conjunctivae normal.     Pupils: Pupils are equal, round, and reactive to light.  Neck:     Musculoskeletal: Normal range of motion and neck supple.  Cardiovascular:     Rate and Rhythm: Normal rate and regular rhythm.  Pulmonary:     Effort: Pulmonary effort is normal.     Breath sounds: Normal breath sounds. No wheezing or rales.  Abdominal:     General: Bowel sounds are normal. There is no distension.     Palpations: Abdomen is soft.     Tenderness: There is no abdominal tenderness.  Musculoskeletal:        General: Tenderness present.     Right shoulder: She exhibits decreased range of motion, tenderness, bony tenderness, pain and spasm. She exhibits no swelling, no crepitus, normal pulse and normal strength.     Lumbar back: She exhibits decreased range of motion, tenderness and spasm. She exhibits no bony tenderness and no edema.     Comments: Tender over lower LS/also Sacrum and L lumbar muscular (more than R) Tight muscles Slight scoliosis  No skin changes or warmth  Flex -almost 90 deg with support (leaning on table) Ext 20 deg Some pain on L lat bend     Lymphadenopathy:     Cervical: No cervical adenopathy.  Skin:    General: Skin is warm and dry.     Coloration: Skin is not pale.     Findings: No erythema or rash.  Neurological:     Mental Status: She is alert.     Cranial Nerves: No cranial nerve deficit.     Sensory: No sensory deficit.     Motor: No atrophy or abnormal muscle tone.     Coordination: Coordination normal.     Deep Tendon Reflexes: Reflexes are normal and symmetric. Reflexes normal.     Comments: Negative bent knee raise for leg pain (does cause back pain)    Psychiatric:        Mood and Affect: Mood normal.           Assessment & Plan:   Problem List Items Addressed This Visit       Other   Low back pain - Primary    Acute on chronic with symptoms of sciatic nerve irritation (but no neuro deficits on exam)  Has seen chiropractor PT is an option- hard for her to get there  Encouraged walking Use of heat  Ref done to ortho given length of problem  Also imaging may be tricky given habitus (morbid obesity)      Relevant Orders   Ambulatory referral to Orthopedic Surgery

## 2018-12-08 NOTE — Assessment & Plan Note (Signed)
Acute on chronic with symptoms of sciatic nerve irritation (but no neuro deficits on exam)  Has seen chiropractor PT is an option- hard for her to get there  Encouraged walking Use of heat  Ref done to ortho given length of problem  Also imaging may be tricky given habitus (morbid obesity)

## 2018-12-08 NOTE — Patient Instructions (Signed)
Use heat on back/buttock -where it hurts for 10 minutes at a time   I did a referral to orthopedics - the office will call you to set that up   The best thing to do is walk - short distances  It helps prevent stiffness   Ibuprofen is ok as needed (take it with food)

## 2018-12-13 ENCOUNTER — Other Ambulatory Visit: Payer: Self-pay | Admitting: Family Medicine

## 2018-12-15 ENCOUNTER — Other Ambulatory Visit: Payer: Self-pay | Admitting: Sports Medicine

## 2018-12-15 DIAGNOSIS — G8929 Other chronic pain: Secondary | ICD-10-CM

## 2018-12-24 ENCOUNTER — Other Ambulatory Visit: Payer: Self-pay | Admitting: Sports Medicine

## 2018-12-24 ENCOUNTER — Encounter: Payer: Self-pay | Admitting: Family Medicine

## 2018-12-24 ENCOUNTER — Ambulatory Visit (INDEPENDENT_AMBULATORY_CARE_PROVIDER_SITE_OTHER): Payer: Medicare Other | Admitting: Family Medicine

## 2018-12-24 VITALS — Temp 97.7°F | Wt 339.0 lb

## 2018-12-24 DIAGNOSIS — J0140 Acute pansinusitis, unspecified: Secondary | ICD-10-CM

## 2018-12-24 DIAGNOSIS — M795 Residual foreign body in soft tissue: Secondary | ICD-10-CM

## 2018-12-24 MED ORDER — AMOXICILLIN-POT CLAVULANATE 875-125 MG PO TABS: 1.0000 | ORAL_TABLET | Freq: Two times a day (BID) | ORAL | 0 refills | Status: AC

## 2018-12-24 NOTE — Progress Notes (Signed)
I connected with Mackenzie Bonilla on 12/24/18 at  9:00 AM EST by video and verified that I am speaking with the correct person using two identifiers.   I discussed the limitations, risks, security and privacy concerns of performing an evaluation and management service by video and the availability of in person appointments. I also discussed with the patient that there may be a patient responsible charge related to this service. The patient expressed understanding and agreed to proceed.  Patient location: Home Provider Location: [employer] Participants: Lesleigh Noe and SHELETA SHIPMAN   Subjective:     Mackenzie Bonilla is a 53 y.o. female presenting for Headache (congestion, puffy eyes, drainage, jaw pain. symptoms X 2 weeks and getting worse. Temp last night was around 99.4 or 99.6)     Headache  This is a new problem. The current episode started 1 to 4 weeks ago. The problem has been gradually worsening. The pain is located in the frontal region. The pain does not radiate. Quality: pressure. Associated symptoms include coughing, dizziness, ear pain, eye pain, a fever (low grade), nausea and sinus pressure. Pertinent negatives include no abdominal pain, hearing loss, sore throat, visual change or vomiting. Nothing aggravates the symptoms. Treatments tried: sleep, antihistamine.   Has saline spray and allergy spray which she has been use  Review of Systems  Constitutional: Positive for fever (low grade).  HENT: Positive for ear pain and sinus pressure. Negative for hearing loss and sore throat.   Eyes: Positive for pain.  Respiratory: Positive for cough.   Gastrointestinal: Positive for nausea. Negative for abdominal pain and vomiting.  Neurological: Positive for dizziness and headaches.     Social History   Tobacco Use  Smoking Status Never Smoker  Smokeless Tobacco Never Used        Objective:   BP Readings from Last 3 Encounters:  12/08/18  124/76  11/12/18 122/82  10/13/18 124/78   Wt Readings from Last 3 Encounters:  12/24/18 (!) 339 lb (153.8 kg)  12/08/18 (!) 339 lb 8 oz (154 kg)  11/12/18 (!) 335 lb (152 kg)    Temp 97.7 F (36.5 C) Comment: per patient  Wt (!) 339 lb (153.8 kg) Comment: per patient  BMI 58.19 kg/m    Physical Exam Constitutional:      Appearance: Normal appearance. She is obese. She is not ill-appearing.  HENT:     Head: Normocephalic and atraumatic.     Right Ear: External ear normal.     Left Ear: External ear normal.  Eyes:     Conjunctiva/sclera: Conjunctivae normal.  Pulmonary:     Effort: Pulmonary effort is normal. No respiratory distress.  Neurological:     Mental Status: She is alert. Mental status is at baseline.  Psychiatric:        Mood and Affect: Mood normal.        Behavior: Behavior normal.        Thought Content: Thought content normal.        Judgment: Judgment normal.            Assessment & Plan:   Problem List Items Addressed This Visit    None    Visit Diagnoses    Acute non-recurrent pansinusitis    -  Primary   Relevant Medications   amoxicillin-clavulanate (AUGMENTIN) 875-125 MG tablet     Discussed taking antibiotic for sinusitis Return if not improved, may need additional course Advised neti pot, but  pt stated she could not tolerate  Return if symptoms worsen or fail to improve.  Lesleigh Noe, MD

## 2018-12-25 ENCOUNTER — Other Ambulatory Visit: Payer: Self-pay

## 2018-12-25 ENCOUNTER — Ambulatory Visit
Admission: RE | Admit: 2018-12-25 | Discharge: 2018-12-25 | Disposition: A | Discharge status: Home / Self Care | Payer: Medicare Other | Source: Ambulatory Visit | Attending: Sports Medicine | Admitting: Sports Medicine

## 2018-12-25 DIAGNOSIS — M795 Residual foreign body in soft tissue: Secondary | ICD-10-CM | POA: Insufficient documentation

## 2018-12-25 DIAGNOSIS — M5442 Lumbago with sciatica, left side: Secondary | ICD-10-CM | POA: Diagnosis present

## 2018-12-25 DIAGNOSIS — G8929 Other chronic pain: Secondary | ICD-10-CM | POA: Diagnosis present

## 2018-12-26 ENCOUNTER — Other Ambulatory Visit: Payer: Self-pay | Admitting: Sports Medicine

## 2018-12-26 DIAGNOSIS — G8929 Other chronic pain: Secondary | ICD-10-CM

## 2018-12-26 DIAGNOSIS — M5442 Lumbago with sciatica, left side: Secondary | ICD-10-CM

## 2018-12-29 ENCOUNTER — Ambulatory Visit
Admission: RE | Admit: 2018-12-29 | Discharge: 2018-12-29 | Disposition: A | Discharge status: Home / Self Care | Payer: Medicare Other | Source: Ambulatory Visit | Attending: Sports Medicine | Admitting: Sports Medicine

## 2018-12-29 ENCOUNTER — Other Ambulatory Visit: Payer: Self-pay | Admitting: Sports Medicine

## 2018-12-29 ENCOUNTER — Other Ambulatory Visit: Payer: Self-pay

## 2018-12-29 DIAGNOSIS — G8929 Other chronic pain: Secondary | ICD-10-CM

## 2018-12-29 DIAGNOSIS — M5442 Lumbago with sciatica, left side: Secondary | ICD-10-CM | POA: Insufficient documentation

## 2018-12-29 LAB — HM DIABETES EYE EXAM

## 2018-12-30 ENCOUNTER — Ambulatory Visit: Payer: Medicare Other | Admitting: Podiatry

## 2018-12-30 ENCOUNTER — Encounter: Payer: Self-pay | Admitting: Podiatry

## 2018-12-30 ENCOUNTER — Encounter: Payer: Self-pay | Admitting: Family Medicine

## 2018-12-30 DIAGNOSIS — B351 Tinea unguium: Secondary | ICD-10-CM

## 2018-12-30 DIAGNOSIS — M79676 Pain in unspecified toe(s): Secondary | ICD-10-CM | POA: Diagnosis not present

## 2018-12-30 DIAGNOSIS — E0843 Diabetes mellitus due to underlying condition with diabetic autonomic (poly)neuropathy: Secondary | ICD-10-CM | POA: Diagnosis not present

## 2019-01-01 NOTE — Progress Notes (Signed)
   SUBJECTIVE Patient presents to office today complaining of elongated, thickened nails that cause pain while ambulating in shoes. She is unable to trim her own nails. Patient is here for further evaluation and treatment.  Past Medical History:  Diagnosis Date  . Allergy    allergic rhinitis  . Anemia   . Anxiety   . Arthritis    osteoarthritis  . Asthma   . Claustrophobia    does not like oxygen mask covering face  . Depression   . Diabetes mellitus without complication ([place])   . Fall 2016  . Fatty liver 07/2008   abd. ultrasound - fatty liver ; slt dilated cbd (no stones ) 06/10// abd.  ultrasound normal on 04/2006  . Fibromyalgia   . GERD (gastroesophageal reflux disease)    EGD negative 06/2001// EGD erythematous mucosa, polyp 08/2008  . High uric acid in 24 hour urine specimen   . Hyperhidrosis   . Hyperlipidemia   . Kidney stones   . Lymphedema   . Obesity   . Shortness of breath dyspnea   . Tachycardia   . TMJ (dislocation of temporomandibular joint)   . UTI (lower urinary tract infection)     OBJECTIVE General Patient is awake, alert, and oriented x 3 and in no acute distress. Derm Skin is dry and supple bilateral. Negative open lesions or macerations. Remaining integument unremarkable. Nails are tender, long, thickened and dystrophic with subungual debris, consistent with onychomycosis, 1-5 bilateral. No signs of infection noted. Vasc  DP and PT pedal pulses palpable bilaterally. Temperature gradient within normal limits.  Neuro Epicritic and protective threshold sensation grossly intact bilaterally.  Musculoskeletal Exam No symptomatic pedal deformities noted bilateral. Muscular strength within normal limits.  ASSESSMENT 1. Onychodystrophic nails 1-5 bilateral with hyperkeratosis of nails.  2. Onychomycosis of nail due to dermatophyte bilateral 3. Pain in foot bilateral  PLAN OF CARE 1. Patient evaluated today.  2. Instructed to maintain good pedal hygiene  and foot care.  3. Mechanical debridement of nails 1-5 bilaterally performed using a nail nipper. Filed with dremel without incident.  4. Return to clinic in 3 mos.    Edrick Kins, DPM [facility]  Dr. Edrick Kins, [address]                                        [place], [place] [address]                Office [phone]  Fax [phone]

## 2019-01-05 ENCOUNTER — Other Ambulatory Visit: Payer: Self-pay | Admitting: Sports Medicine

## 2019-01-05 DIAGNOSIS — M5416 Radiculopathy, lumbar region: Secondary | ICD-10-CM

## 2019-01-05 DIAGNOSIS — M5442 Lumbago with sciatica, left side: Secondary | ICD-10-CM

## 2019-01-05 DIAGNOSIS — G8929 Other chronic pain: Secondary | ICD-10-CM

## 2019-01-09 ENCOUNTER — Other Ambulatory Visit: Payer: Self-pay | Admitting: Family Medicine

## 2019-01-12 ENCOUNTER — Other Ambulatory Visit: Payer: Medicare Other

## 2019-01-13 ENCOUNTER — Ambulatory Visit
Admission: RE | Admit: 2019-01-13 | Discharge: 2019-01-13 | Disposition: A | Discharge status: Home / Self Care | Payer: Medicare Other | Source: Ambulatory Visit | Attending: Sports Medicine | Admitting: Sports Medicine

## 2019-01-13 ENCOUNTER — Other Ambulatory Visit: Payer: Self-pay

## 2019-01-13 DIAGNOSIS — G8929 Other chronic pain: Secondary | ICD-10-CM

## 2019-01-13 DIAGNOSIS — M5416 Radiculopathy, lumbar region: Secondary | ICD-10-CM

## 2019-01-13 MED ORDER — IOPAMIDOL (ISOVUE-M 200) INJECTION 41%
1.0000 mL | Freq: Once | INTRAMUSCULAR | Status: AC
  Administered 2019-01-13: 1 mL via EPIDURAL

## 2019-01-13 MED ORDER — METHYLPREDNISOLONE ACETATE 40 MG/ML INJ SUSP (RADIOLOG
120.0000 mg | Freq: Once | INTRAMUSCULAR | Status: AC
  Administered 2019-01-13: 120 mg via EPIDURAL

## 2019-01-13 NOTE — Discharge Instructions (Signed)

## 2019-01-15 ENCOUNTER — Ambulatory Visit: Payer: Medicare Other | Admitting: Student in an Organized Health Care Education/Training Program

## 2019-01-16 ENCOUNTER — Other Ambulatory Visit: Payer: Self-pay | Admitting: Podiatry

## 2019-01-22 ENCOUNTER — Other Ambulatory Visit: Payer: Self-pay | Admitting: Sports Medicine

## 2019-01-22 DIAGNOSIS — M5416 Radiculopathy, lumbar region: Secondary | ICD-10-CM

## 2019-01-23 ENCOUNTER — Other Ambulatory Visit: Payer: Self-pay | Admitting: Podiatry

## 2019-01-23 MED ORDER — GABAPENTIN 100 MG PO CAPS: 100.0000 mg | ORAL_CAPSULE | Freq: Two times a day (BID) | ORAL | 3 refills | Status: DC

## 2019-01-23 MED ORDER — GABAPENTIN 300 MG PO CAPS: 300.0000 mg | ORAL_CAPSULE | Freq: Every day | ORAL | 3 refills | Status: DC

## 2019-01-29 ENCOUNTER — Other Ambulatory Visit: Payer: Medicare Other

## 2019-01-29 ENCOUNTER — Other Ambulatory Visit: Payer: Self-pay

## 2019-01-29 ENCOUNTER — Ambulatory Visit
Admission: RE | Admit: 2019-01-29 | Discharge: 2019-01-29 | Disposition: A | Discharge status: Home / Self Care | Payer: Medicare Other | Source: Ambulatory Visit | Attending: Sports Medicine | Admitting: Sports Medicine

## 2019-01-29 DIAGNOSIS — M5416 Radiculopathy, lumbar region: Secondary | ICD-10-CM

## 2019-01-29 MED ORDER — METHYLPREDNISOLONE ACETATE 40 MG/ML INJ SUSP (RADIOLOG
120.0000 mg | Freq: Once | INTRAMUSCULAR | Status: AC
  Administered 2019-01-29: 15:00:00 120 mg via EPIDURAL

## 2019-01-29 MED ORDER — IOPAMIDOL (ISOVUE-M 200) INJECTION 41%
1.0000 mL | Freq: Once | INTRAMUSCULAR | Status: AC
  Administered 2019-01-29: 1 mL via EPIDURAL

## 2019-01-29 NOTE — Discharge Instructions (Signed)

## 2019-02-11 ENCOUNTER — Ambulatory Visit: Payer: Medicare Other | Admitting: Family Medicine

## 2019-02-11 ENCOUNTER — Other Ambulatory Visit: Payer: Self-pay

## 2019-02-11 ENCOUNTER — Encounter: Payer: Self-pay | Admitting: Family Medicine

## 2019-02-11 VITALS — BP 118/70 | HR 76 | Temp 96.9°F | Ht 64.0 in | Wt 333.2 lb

## 2019-02-11 DIAGNOSIS — M549 Dorsalgia, unspecified: Secondary | ICD-10-CM | POA: Diagnosis not present

## 2019-02-11 DIAGNOSIS — E785 Hyperlipidemia, unspecified: Secondary | ICD-10-CM | POA: Diagnosis not present

## 2019-02-11 DIAGNOSIS — G8929 Other chronic pain: Secondary | ICD-10-CM

## 2019-02-11 DIAGNOSIS — E1169 Type 2 diabetes mellitus with other specified complication: Secondary | ICD-10-CM | POA: Diagnosis not present

## 2019-02-11 DIAGNOSIS — M545 Low back pain: Secondary | ICD-10-CM

## 2019-02-11 DIAGNOSIS — Z6841 Body Mass Index (BMI) 40.0 and over, adult: Secondary | ICD-10-CM

## 2019-02-11 MED ORDER — PRAVASTATIN SODIUM 20 MG PO TABS: 20.0000 mg | ORAL_TABLET | Freq: Every day | ORAL | 11 refills | Status: DC

## 2019-02-11 MED ORDER — TRAMADOL HCL 50 MG PO TABS: 50.0000 mg | ORAL_TABLET | Freq: Two times a day (BID) | ORAL | 0 refills | Status: DC | PRN

## 2019-02-11 MED ORDER — ALBUTEROL SULFATE HFA 108 (90 BASE) MCG/ACT IN AERS: 2.0000 | INHALATION_SPRAY | Freq: Four times a day (QID) | RESPIRATORY_TRACT | 3 refills | Status: DC | PRN

## 2019-02-11 NOTE — Assessment & Plan Note (Signed)
Again- most likely adding to some of her medical problems and discomfort  Strongly enc wt loss effort

## 2019-02-11 NOTE — Assessment & Plan Note (Signed)
Intermittent and positional and worse on the R  occ worse with a deep breath  Tender R thoracic musculature  Responds to flexeril-this was refilled with caution of sedation  Tramadol is reserved for severe pain  Handout given re: back exercise/stretch  Strongly enc PT-she will consider but not ready to do it now  Will watch for worse symptoms on neuro changes

## 2019-02-11 NOTE — Assessment & Plan Note (Signed)
Taking pravastatin  Disc goals for lipids and reasons to control them Rev last labs with pt Rev low sat fat diet in detail Labs today

## 2019-02-11 NOTE — Assessment & Plan Note (Signed)
Pt has disc protrusion /S1 nerve root involvement and facet arthritis  Has seen ortho and had injections Surgery not an option  Is contemplating pain clinic

## 2019-02-11 NOTE — Patient Instructions (Addendum)
Use flexeril when needed -be cautious of sedation   Take a look at the exercises  Let us know when you are ready for referral to physical therapy   Ice and heat and massage are helpful  Use a tennis ball between your back and the wall   Cholesterol labs today  Avoid red meat/ fried foods/ egg yolks/ fatty breakfast meats/ butter, cheese and high fat dairy/ and shellfish

## 2019-02-11 NOTE — Progress Notes (Signed)
Subjective:    Patient ID: Mackenzie Bonilla, female    DOB: 01-24-1966, 53 y.o.   MRN: [id number]  This visit occurred during the SARS-CoV-2 public health emergency.  Safety protocols were in place, including screening questions prior to the visit, additional usage of staff PPE, and extensive cleaning of exam room while observing appropriate contact time as indicated for disinfecting solutions.    HPI Pt presents with back pain (acute on chronic)   Wt Readings from Last 3 Encounters:  02/11/19 (!) 333 lb 4 oz (151.2 kg)  12/24/18 (!) 339 lb (153.8 kg)  12/08/18 (!) 339 lb 8 oz (154 kg)   57.20 kg/m   Was seen by orthopedics for low back pain with L leg symptoms/radiculopathy  Had some injections  Ortho spoke to neuro surg who indicated that they would not offer surgery unless she had leg weakness  (? Also due to wt) Pain clinic was an option   She is thinking about that   CT of LS was done in nov: Interpretation  IMPRESSION: 1. Focal disc protrusion to the left of midline at L5-S1 touching the left S1 nerve root sleeve. 2. Severe right facet arthritis at L4-5.  Pain sometimes radiates to her mid back  That will move to her neck  occ worse with a very deep breath  Worse on the R today  She gets out of breath easily with exertion  Walking or lifting heavy - also makes her back hurt  Driving car makes it worse also  Also sitting on uneven cushions on couch    Flexeril helps   No comfortable position    BP Readings from Last 3 Encounters:  02/11/19 118/70  01/29/19 (!) 179/71  01/13/19 (!) 144/77   Pulse Readings from Last 3 Encounters:  02/11/19 76  01/29/19 77  01/13/19 95   No h/o heart problems   Lab Results  Component Value Date   CHOL 247 (H) 05/21/2017   HDL 40 05/21/2017   LDLCALC 153 (H) 05/21/2017   LDLDIRECT 112.0 11/05/2014   TRIG 269 (H) 05/21/2017   CHOLHDL 6.2 (H) 05/21/2017  intol of statins  Able to take pravastatin  Overdue for  cholesterol check   Last A1C was 8.1  Seeing endocrinology    Patient Active Problem List   Diagnosis Date Noted  . Mid back pain 02/11/2019  . Multiple joint pain 11/12/2018  . UTI (urinary tract infection) 04/08/2018  . Eating disorder 05/23/2017  . Gallstones 04/19/2017  . Diabetic polyneuropathy associated with type 2 diabetes mellitus ([place]) 04/07/2017  . Diabetic angiopathy ([place]) 04/07/2017  . Vasomotor symptoms due to menopause 04/07/2017  . Mobility impaired 11/12/2016  . Urinary incontinence 12/02/2015  . Candidal intertrigo 04/06/2015  . Cystocele 04/06/2015  . Constipation 04/06/2015  . Low back pain 12/28/2014  . Hypertriglyceridemia 10/14/2014  . Breast mass in female 07/28/2014  . Fall 07/23/2013  . Great toe pain 05/12/2013  . Medication management 11/20/2012  . Uric acid kidney stone 11/20/2011  . Hematuria 08/09/2010  . HYPERHIDROSIS 11/07/2009  . Menopausal symptoms 06/17/2009  . Type 2 diabetes mellitus without complication, without long-term current use of insulin ([place]) 08/23/2008  . VITAMIN D DEFICIENCY 05/07/2008  . Vitamin B 12 deficiency 06/04/2007  . CHRONIC FATIGUE SYNDROME 11/15/2006  . DIVERTICULITIS, HX OF 11/15/2006  . PCOS (polycystic ovarian syndrome) 10/16/2006  . Hyperlipidemia associated with type 2 diabetes mellitus ([place]) 10/16/2006  . Morbid obesity with BMI of 50.0-59.9, adult ([place])  10/16/2006  . Generalized anxiety disorder 10/16/2006  . PANIC DISORDER 10/16/2006  . BULIMIA 10/16/2006  . Chronic depression 10/16/2006  . CARPAL TUNNEL SYNDROME 10/16/2006  . LYMPHEDEMA 10/16/2006  . Allergic rhinitis 10/16/2006  . Mild intermittent asthma 10/16/2006  . TMJ SYNDROME 10/16/2006  . GERD 10/16/2006  . IRRITABLE BOWEL SYNDROME 10/16/2006  . Osteoarthritis 10/16/2006  . COUGH, CHRONIC 10/16/2006   Past Medical History:  Diagnosis Date  . Allergy    allergic rhinitis  . Anemia   . Anxiety   . Arthritis    osteoarthritis  .  Asthma   . Claustrophobia    does not like oxygen mask covering face  . Depression   . Diabetes mellitus without complication ([place])   . Fall 2016  . Fatty liver 07/2008   abd. ultrasound - fatty liver ; slt dilated cbd (no stones ) 06/10// abd.  ultrasound normal on 04/2006  . Fibromyalgia   . GERD (gastroesophageal reflux disease)    EGD negative 06/2001// EGD erythematous mucosa, polyp 08/2008  . High uric acid in 24 hour urine specimen   . Hyperhidrosis   . Hyperlipidemia   . Kidney stones   . Lymphedema   . Obesity   . Shortness of breath dyspnea   . Tachycardia   . TMJ (dislocation of temporomandibular joint)   . UTI (lower urinary tract infection)    Past Surgical History:  Procedure Laterality Date  . BREAST BIOPSY Right 2016   neg  . BREAST LUMPECTOMY WITH RADIOACTIVE SEED LOCALIZATION Right 08/02/2014   Procedure: BREAST LUMPECTOMY WITH RADIOACTIVE SEED LOCALIZATION;  Surgeon: Rolm Bookbinder, MD;  Location: [facility];  Service: General;  Laterality: Right;  . COLONOSCOPY  08/12/2012   Completed by Dr. Phylis Bougie, normal exam.   . ENDOMETRIAL BIOPSY  01/2004  . ESOPHAGOGASTRODUODENOSCOPY    . FOOT SURGERY    . SEPTOPLASTY     two times  . TONSILLECTOMY    . UTERINE FIBROID SURGERY  06/2005   ablation  . uterine tumor  09/2001  . VAGUS NERVE STIMULATOR INSERTION     Social History   Tobacco Use  . Smoking status: Never Smoker  . Smokeless tobacco: Never Used  Substance Use Topics  . Alcohol use: No    Alcohol/week: 0.0 standard drinks  . Drug use: No   Family History  Problem Relation Age of Onset  . Hypertension Father   . Cancer Father        pancreatic cancer  . Hypertension Sister   . Heart disease Maternal Grandmother 60       MI  . Diabetes Maternal Grandmother   . Heart disease Paternal Grandmother 31       MI  . Diabetes Paternal Grandmother   . Breast cancer Neg Hx    Allergies  Allergen Reactions  . Cephalexin   . Codeine     REACTION: ?  reaction  . Duloxetine   . Levaquin [Levofloxacin] Nausea Only  . Lisinopril Cough  . Metformin And Related Diarrhea    GI side effects   . Prednisone     Tolerates intraarticular depo-medrol.  . Quetiapine Other (See Comments)  . Sertraline Hcl     REACTION: vivid dreams  . Simvastatin Other (See Comments)    achey  . Triazolam     REACTION: ? reaction  . Zaleplon   . Zocor [Simvastatin - High Dose]     achey  . Zolpidem Tartrate     hallucinations  .  Hydrocodone Rash    REACTION: ? reaction REACTION: ? reaction  . Norelgestromin-Eth Estradiol     Patch didn't stick to skin   Current Outpatient Medications on File Prior to Visit  Medication Sig Dispense Refill  . ACCU-CHEK AVIVA PLUS test strip Use to check blood sugar  once a day 100 each 0  . ACCU-CHEK SOFTCLIX LANCETS lancets Use to check blood sugar  once a day 100 each 0  . acetaminophen (TYLENOL) 500 MG tablet Take 500 mg by mouth every 6 (six) hours as needed.     . ALPRAZolam (XANAX) 1 MG tablet Take 2 mg by mouth 3 (three) times daily as needed.     . Blood Glucose Calibration (ACCU-CHEK AVIVA) SOLN     . Cyanocobalamin 1000 MCG/ML KIT Inject 1,000 mcg as directed once a week. For 4 weeks, then inject once monthly, please administer at the pharmacy 1 kit 11  . cyclobenzaprine (FLEXERIL) 5 MG tablet TAKE ONE TABLET THREE TIMES A DAY IF NEEDED FOR MUSCLE SPASM 30 tablet 3  . desvenlafaxine (PRISTIQ) 100 MG 24 hr tablet     . dimenhyDRINATE (DRAMAMINE) 50 MG tablet Take 50 mg by mouth every 8 (eight) hours as needed.    . gabapentin (NEURONTIN) 100 MG capsule Take 1 capsule (100 mg total) by mouth 2 (two) times daily. 360 capsule 3  . gabapentin (NEURONTIN) 300 MG capsule Take 1 capsule (300 mg total) by mouth at bedtime. 90 capsule 3  . glipiZIDE (GLUCOTROL XL) 10 MG 24 hr tablet Take 1 tablet by mouth daily.    Marland Kitchen ibuprofen (ADVIL,MOTRIN) 600 MG tablet Take 1 tablet (600 mg total) by mouth every 8 (eight) hours as  needed. 30 tablet 0  . ketoconazole (NIZORAL) 2 % cream Apply 1 application topically daily as needed for irritation. For yeast rash to affected area 30 g 3  . loratadine (CLARITIN) 10 MG tablet Take by mouth.    . losartan (COZAAR) 25 MG tablet     . nystatin (MYCOSTATIN/NYSTOP) powder Apply topically 3 (three) times daily. To affected areas of yeast rash 60 g 3  . omeprazole (PRILOSEC) 20 MG capsule TAKE 1 CAPSULE BY MOUTH TWICE DAILY 180 capsule 1  . oxybutynin (DITROPAN XL) 15 MG 24 hr tablet Take 1 tablet (15 mg total) by mouth daily. 90 tablet 1  . Polyethyl Glycol-Propyl Glycol (SYSTANE OP) Apply 1 drop to eye daily as needed.    . Simethicone (GAS-X PO) Take by mouth.    . spironolactone (ALDACTONE) 100 MG tablet TAKE ONE TABLET EVERY DAY 30 tablet 3  . traZODone (DESYREL) 150 MG tablet Take 300 mg by mouth at bedtime.     . triamcinolone cream (KENALOG) 0.1 %     . TRULICITY 3 ZO/1.0RU SOPN INJECT 3MG (0.5ML) SUBQ EVERY 7 DAY     No current facility-administered medications on file prior to visit.    Review of Systems  Constitutional: Positive for fatigue. Negative for activity change, appetite change, fever and unexpected weight change.  HENT: Negative for congestion, ear pain, rhinorrhea, sinus pressure and sore throat.   Eyes: Negative for pain, redness and visual disturbance.  Respiratory: Negative for cough, shortness of breath and wheezing.        Poor exercise tolerance  Cardiovascular: Negative for chest pain and palpitations.  Gastrointestinal: Negative for abdominal pain, blood in stool, constipation and diarrhea.       IBS  Endocrine: Negative for polydipsia and polyuria.  Genitourinary: Negative  for dysuria, frequency and urgency.  Musculoskeletal: Positive for arthralgias, back pain, gait problem and myalgias. Negative for joint swelling.  Skin: Negative for pallor and rash.  Allergic/Immunologic: Negative for environmental allergies.  Neurological: Negative for  dizziness, syncope and headaches.  Hematological: Negative for adenopathy. Does not bruise/bleed easily.  Psychiatric/Behavioral: Positive for dysphoric mood and sleep disturbance. Negative for decreased concentration. The patient is nervous/anxious.        Objective:   Physical Exam Constitutional:      General: She is not in acute distress.    Appearance: Normal appearance. She is obese. She is not ill-appearing or diaphoretic.  HENT:     Head: Normocephalic and atraumatic.  Eyes:     General: No scleral icterus.    Conjunctiva/sclera: Conjunctivae normal.     Pupils: Pupils are equal, round, and reactive to light.  Cardiovascular:     Rate and Rhythm: Normal rate and regular rhythm.  Pulmonary:     Effort: Pulmonary effort is normal. No respiratory distress.     Breath sounds: Normal breath sounds. No stridor. No wheezing, rhonchi or rales.  Abdominal:     General: There is no distension.     Tenderness: There is no abdominal tenderness.  Musculoskeletal:     Cervical back: Normal range of motion. No rigidity.     Comments: Tenderness of R lateral ribs/mild (no crepitus or step off noted) Tenderness of R thoracic musculature with spasm  Nl rom of UEs  No bony tenderness Cannot r/o scoliosis due to habitus   Limited rom of LS   Lymphadenopathy:     Cervical: No cervical adenopathy.  Skin:    Findings: No erythema or rash.  Neurological:     Mental Status: She is alert.     Cranial Nerves: No cranial nerve deficit.     Motor: No weakness.     Coordination: Coordination normal.     Gait: Gait normal.     Comments: Slow altered gait due to morbid obesity  Psychiatric:        Mood and Affect: Mood is depressed.           Assessment & Plan:   Problem List Items Addressed This Visit      Endocrine   Hyperlipidemia associated with type 2 diabetes mellitus ([place])    Taking pravastatin  Disc goals for lipids and reasons to control them Rev last labs with pt Rev  low sat fat diet in detail Labs today      Relevant Medications   TRULICITY 3 VP/0.3EK SOPN   glipiZIDE (GLUCOTROL XL) 10 MG 24 hr tablet   pravastatin (PRAVACHOL) 20 MG tablet   Other Relevant Orders   Lipid panel     Other   Morbid obesity with BMI of 50.0-59.9, adult ([place])    Again- most likely adding to some of her medical problems and discomfort  Strongly enc wt loss effort      Relevant Medications   TRULICITY 3 BT/2.4EL SOPN   glipiZIDE (GLUCOTROL XL) 10 MG 24 hr tablet   Low back pain    Pt has disc protrusion /S1 nerve root involvement and facet arthritis  Has seen ortho and had injections Surgery not an option  Is contemplating pain clinic      Relevant Medications   traMADol (ULTRAM) 50 MG tablet   Mid back pain - Primary    Intermittent and positional and worse on the R  occ worse with a deep breath  Tender R thoracic musculature  Responds to flexeril-this was refilled with caution of sedation  Tramadol is reserved for severe pain  Handout given re: back exercise/stretch  Strongly enc PT-she will consider but not ready to do it now  Will watch for worse symptoms on neuro changes       Relevant Medications   traMADol (ULTRAM) 50 MG tablet

## 2019-02-12 LAB — LIPID PANEL
Cholesterol: 193 mg/dL (ref 0–200)
HDL: 45.6 mg/dL (ref >39.00)
LDL Cholesterol: 121 mg/dL — ABNORMAL HIGH (ref 0–99)
NonHDL: 147.7
Total CHOL/HDL Ratio: 4
Triglycerides: 135 mg/dL (ref 0.0–149.0)
VLDL: 27 mg/dL (ref 0.0–40.0)

## 2019-03-03 ENCOUNTER — Ambulatory Visit: Payer: Medicare PPO | Admitting: Podiatry

## 2019-03-03 ENCOUNTER — Other Ambulatory Visit: Payer: Self-pay

## 2019-03-03 DIAGNOSIS — B351 Tinea unguium: Secondary | ICD-10-CM

## 2019-03-03 DIAGNOSIS — M79676 Pain in unspecified toe(s): Secondary | ICD-10-CM | POA: Diagnosis not present

## 2019-03-03 DIAGNOSIS — E0843 Diabetes mellitus due to underlying condition with diabetic autonomic (poly)neuropathy: Secondary | ICD-10-CM

## 2019-03-05 NOTE — Progress Notes (Signed)
   SUBJECTIVE Patient with a history of diabetes mellitus presents to office today complaining of elongated, thickened nails that cause pain while ambulating in shoes. She is unable to trim her own nails. Patient is here for further evaluation and treatment.   Past Medical History:  Diagnosis Date  . Allergy    allergic rhinitis  . Anemia   . Anxiety   . Arthritis    osteoarthritis  . Asthma   . Claustrophobia    does not like oxygen mask covering face  . Depression   . Diabetes mellitus without complication ([place])   . Fall 2016  . Fatty liver 07/2008   abd. ultrasound - fatty liver ; slt dilated cbd (no stones ) 06/10// abd.  ultrasound normal on 04/2006  . Fibromyalgia   . GERD (gastroesophageal reflux disease)    EGD negative 06/2001// EGD erythematous mucosa, polyp 08/2008  . High uric acid in 24 hour urine specimen   . Hyperhidrosis   . Hyperlipidemia   . Kidney stones   . Lymphedema   . Obesity   . Shortness of breath dyspnea   . Tachycardia   . TMJ (dislocation of temporomandibular joint)   . UTI (lower urinary tract infection)     OBJECTIVE General Patient is awake, alert, and oriented x 3 and in no acute distress. Derm Skin is dry and supple bilateral. Negative open lesions or macerations. Remaining integument unremarkable. Nails are tender, long, thickened and dystrophic with subungual debris, consistent with onychomycosis, 1-5 bilateral. No signs of infection noted. Vasc  DP and PT pedal pulses palpable bilaterally. Temperature gradient within normal limits.  Neuro Epicritic and protective threshold sensation diminished bilaterally.  Musculoskeletal Exam No symptomatic pedal deformities noted bilateral. Muscular strength within normal limits.  ASSESSMENT 1. Diabetes Mellitus w/ peripheral neuropathy 2. Onychomycosis of nail due to dermatophyte bilateral 3. Pain in foot bilateral  PLAN OF CARE 1. Patient evaluated today. 2. Instructed to maintain good pedal  hygiene and foot care. Stressed importance of controlling blood sugar.  3. Mechanical debridement of nails 1-5 bilaterally performed using a nail nipper. Filed with dremel without incident.  4. Return to clinic in 3 mos.     Edrick Kins, DPM [facility]  Dr. Edrick Kins, [address]                                        [place], [place] [address]                Office [phone]  Fax [phone]

## 2019-03-11 ENCOUNTER — Other Ambulatory Visit: Payer: Self-pay | Admitting: Family Medicine

## 2019-03-11 DIAGNOSIS — H93292 Other abnormal auditory perceptions, left ear: Secondary | ICD-10-CM | POA: Diagnosis not present

## 2019-03-11 DIAGNOSIS — M26629 Arthralgia of temporomandibular joint, unspecified side: Secondary | ICD-10-CM | POA: Diagnosis not present

## 2019-03-11 DIAGNOSIS — H6123 Impacted cerumen, bilateral: Secondary | ICD-10-CM | POA: Diagnosis not present

## 2019-03-17 ENCOUNTER — Other Ambulatory Visit: Payer: Self-pay | Admitting: Family Medicine

## 2019-03-17 MED ORDER — NYSTATIN 100000 UNIT/GM EX CREA: 1.0000 "application " | TOPICAL_CREAM | Freq: Three times a day (TID) | CUTANEOUS | 3 refills | Status: DC | PRN

## 2019-03-17 NOTE — Telephone Encounter (Signed)
Pt said she does want to try the nystatin cream instead of ketoconazole cream (med removed from med list), I spoke with Dr. Glori Bickers an she is also okay with pt using zeasorb antifungal powder as needed also. Pt stopped using the nystatin powder because the holes get clogged up and she couldn't put it in her creases as well as she needed to so she will keep using the zeasorb antifungal for now

## 2019-03-17 NOTE — Telephone Encounter (Signed)
Last filled on 10/13/18 #60g with 3 refills, also see note on Rx from pharmacy saying pt is requesting this in a cream formulation if possible, last OV was 02/11/19 for back pain

## 2019-03-17 NOTE — Telephone Encounter (Signed)
I pended it to send  Was formerly using powder for more wet areas and ketoconazole cream (another anti fungal) for dry spots   She does not need to use both the ketoconazole cream and this one - can fill whichever works best for her

## 2019-04-27 DIAGNOSIS — R1310 Dysphagia, unspecified: Secondary | ICD-10-CM | POA: Diagnosis not present

## 2019-04-27 DIAGNOSIS — J309 Allergic rhinitis, unspecified: Secondary | ICD-10-CM | POA: Diagnosis not present

## 2019-05-05 DIAGNOSIS — E1142 Type 2 diabetes mellitus with diabetic polyneuropathy: Secondary | ICD-10-CM | POA: Diagnosis not present

## 2019-05-05 DIAGNOSIS — E1165 Type 2 diabetes mellitus with hyperglycemia: Secondary | ICD-10-CM | POA: Diagnosis not present

## 2019-05-11 ENCOUNTER — Other Ambulatory Visit: Payer: Self-pay | Admitting: Family Medicine

## 2019-05-11 ENCOUNTER — Ambulatory Visit: Payer: Medicare PPO | Attending: Internal Medicine

## 2019-05-11 ENCOUNTER — Other Ambulatory Visit: Payer: Self-pay

## 2019-05-11 DIAGNOSIS — Z23 Encounter for immunization: Secondary | ICD-10-CM

## 2019-05-11 NOTE — Progress Notes (Signed)
   Covid-19 Vaccination Clinic  Name:  JADASIA BEIGHLE    MRN: [id number] DOB: [birth year]  05/11/2019  Ms. Madero was observed post Covid-19 immunization for 15 minutes without incident. She was provided with Vaccine Information Sheet and instruction to access the V-Safe system.   Ms. Kicinski was instructed to call 911 with any severe reactions post vaccine: Marland Kitchen Difficulty breathing  . Swelling of face and throat  . A fast heartbeat  . A bad rash all over body  . Dizziness and weakness   Immunizations Administered    Name Date Dose VIS Date Route   Pfizer COVID-19 Vaccine 05/11/2019 11:22 AM 0.3 mL 01/23/2019 Intramuscular   Manufacturer: [place]   Lot: [id number]   [place]: [id number]

## 2019-05-12 ENCOUNTER — Other Ambulatory Visit: Payer: Self-pay

## 2019-05-12 ENCOUNTER — Ambulatory Visit: Payer: Medicare PPO | Admitting: Podiatry

## 2019-05-12 ENCOUNTER — Encounter: Payer: Self-pay | Admitting: Podiatry

## 2019-05-12 VITALS — Temp 98.4°F

## 2019-05-12 DIAGNOSIS — I890 Lymphedema, not elsewhere classified: Secondary | ICD-10-CM

## 2019-05-12 DIAGNOSIS — B351 Tinea unguium: Secondary | ICD-10-CM

## 2019-05-12 DIAGNOSIS — M79676 Pain in unspecified toe(s): Secondary | ICD-10-CM

## 2019-05-12 DIAGNOSIS — E1142 Type 2 diabetes mellitus with diabetic polyneuropathy: Secondary | ICD-10-CM

## 2019-05-12 DIAGNOSIS — E0843 Diabetes mellitus due to underlying condition with diabetic autonomic (poly)neuropathy: Secondary | ICD-10-CM

## 2019-05-12 DIAGNOSIS — E1165 Type 2 diabetes mellitus with hyperglycemia: Secondary | ICD-10-CM | POA: Diagnosis not present

## 2019-05-14 NOTE — Progress Notes (Signed)
   SUBJECTIVE Patient with a history of diabetes mellitus presents to office today complaining of elongated, thickened nails that cause pain while ambulating in shoes. She is unable to trim her own nails. She reports continued swelling of the bilateral feet and ankles. Patient is here for further evaluation and treatment.   Past Medical History:  Diagnosis Date  . Allergy    allergic rhinitis  . Anemia   . Anxiety   . Arthritis    osteoarthritis  . Asthma   . Claustrophobia    does not like oxygen mask covering face  . Depression   . Diabetes mellitus without complication ([place])   . Fall 2016  . Fatty liver 07/2008   abd. ultrasound - fatty liver ; slt dilated cbd (no stones ) 06/10// abd.  ultrasound normal on 04/2006  . Fibromyalgia   . GERD (gastroesophageal reflux disease)    EGD negative 06/2001// EGD erythematous mucosa, polyp 08/2008  . High uric acid in 24 hour urine specimen   . Hyperhidrosis   . Hyperlipidemia   . Kidney stones   . Lymphedema   . Obesity   . Shortness of breath dyspnea   . Tachycardia   . TMJ (dislocation of temporomandibular joint)   . UTI (lower urinary tract infection)     OBJECTIVE General Patient is awake, alert, and oriented x 3 and in no acute distress. Derm Skin is dry and supple bilateral. Negative open lesions or macerations. Remaining integument unremarkable. Nails are tender, long, thickened and dystrophic with subungual debris, consistent with onychomycosis, 1-5 bilateral. No signs of infection noted. Vasc  Bilateral lower extremity edema noted. DP and PT pedal pulses palpable bilaterally. Temperature gradient within normal limits.  Neuro Epicritic and protective threshold sensation diminished bilaterally.  Musculoskeletal Exam No symptomatic pedal deformities noted bilateral. Muscular strength within normal limits.  ASSESSMENT 1. Diabetes Mellitus w/ peripheral neuropathy 2. Onychomycosis of nail due to dermatophyte bilateral 3.  Bilateral lower extremity edema   PLAN OF CARE 1. Patient evaluated today. 2. Instructed to maintain good pedal hygiene and foot care. Stressed importance of controlling blood sugar.  3. Mechanical debridement of nails 1-5 bilaterally performed using a nail nipper. Filed with dremel without incident.  4. Compression anklet dispensed.  5. Return to clinic as needed.     Edrick Kins, DPM [facility]  Dr. Edrick Kins, [place] [place]                                        [place], [place] [address]                Office [phone]  Fax [phone]

## 2019-06-02 ENCOUNTER — Ambulatory Visit: Payer: Medicare PPO | Admitting: Podiatry

## 2019-06-03 ENCOUNTER — Ambulatory Visit: Payer: Medicare PPO | Attending: Internal Medicine

## 2019-06-03 DIAGNOSIS — Z23 Encounter for immunization: Secondary | ICD-10-CM

## 2019-06-03 NOTE — Progress Notes (Signed)
   Covid-19 Vaccination Clinic  Name:  Mackenzie Bonilla    MRN: [id number] DOB: [birth year]  06/03/2019  Mackenzie Bonilla was observed post Covid-19 immunization for 15 minutes without incident. She was provided with Vaccine Information Sheet and instruction to access the V-Safe system.   Mackenzie Bonilla was instructed to call 911 with any severe reactions post vaccine: Marland Kitchen Difficulty breathing  . Swelling of face and throat  . A fast heartbeat  . A bad rash all over body  . Dizziness and weakness   Immunizations Administered    Name Date Dose VIS Date Route   Pfizer COVID-19 Vaccine 06/03/2019 12:42 PM 0.3 mL 04/08/2018 Intramuscular   Manufacturer: [place]   Lot: [id number]   [place]: [id number]

## 2019-06-24 ENCOUNTER — Other Ambulatory Visit: Payer: Self-pay | Admitting: Family Medicine

## 2019-06-24 ENCOUNTER — Ambulatory Visit: Payer: Medicare PPO | Admitting: Gastroenterology

## 2019-06-24 NOTE — Telephone Encounter (Signed)
Cyclobenzaprine was last refilled on 04/08/18 for #30 with 3 refills.  Spironolactone was last refilled on 01/12/19 for #30 with 3 refills. Patient was last seen in the office on 02/11/19 and has no upcoming appts. Are these medications ok to refill?

## 2019-07-01 ENCOUNTER — Telehealth: Payer: Self-pay | Admitting: Family Medicine

## 2019-07-01 MED ORDER — KETOCONAZOLE 2 % EX CREA: 1.0000 "application " | TOPICAL_CREAM | Freq: Every day | CUTANEOUS | 1 refills | Status: DC | PRN

## 2019-07-01 NOTE — Telephone Encounter (Signed)
Patient called, She stated that she believes she may have another yeast infection or possible a heat rash.  She stated she gets this frequently where her stomach hangs over her leg. She stated that this time it is itchy and warm to touch. She stated she has been using the Nystatin Cream and then Zeasorb in between the yeast infections. She wanted to know what she could do for this. And if you would like her to come in for an appointment   Patient also stated she was speaking to her pharmacist and they stated that corn starch likes yeast., she has a powder that she uses that has corn starch in it. And wonders if this could be prolonging the yeast infections when she gets them    Patient asked for a call back. If it is tomorrow she asked if it could be After 9:00am

## 2019-07-01 NOTE — Telephone Encounter (Signed)
Spoke to pt. Sent rx to [facility] at her request.

## 2019-07-01 NOTE — Telephone Encounter (Signed)
This is a recurrent problem Please keep the area as clean and dry as possible Continue zeasorb Stop cornstarch   Please send pended ketoconazole cream px to pref. pharmacy  Let me know if this does not start to help within the next week

## 2019-07-02 ENCOUNTER — Telehealth: Payer: Self-pay | Admitting: Family Medicine

## 2019-07-02 DIAGNOSIS — E119 Type 2 diabetes mellitus without complications: Secondary | ICD-10-CM

## 2019-07-02 DIAGNOSIS — Z6841 Body Mass Index (BMI) 40.0 and over, adult: Secondary | ICD-10-CM

## 2019-07-02 NOTE — Telephone Encounter (Signed)
Patient called.  Patient's requesting a referral to [facility] at [facility].  Patient said it's for refreshers for nutrition and diabetes one on one.  Patient said she spoke to [place]. Patient said she can go anytime. Otila Kluver will be available to schedule next week, but she'll be off for 2 weeks after.

## 2019-07-03 NOTE — Telephone Encounter (Signed)
Ref done Will send to [facility] 

## 2019-07-06 NOTE — Telephone Encounter (Signed)
If her rash is getting worse instead of better please schedule in office f/u

## 2019-07-06 NOTE — Telephone Encounter (Signed)
[facility] - Client TELEPHONE ADVICE RECORD [facility] Patient Name: Mackenzie Bonilla Gender: Female DOB: [birth year] Age: 54 Y 11 M 16 D Return Phone Number: [phone] (Primary) Address: City/State/Zip: [place] [address] Client [facility] - Client Client Site [place] Physician Tower, Roque Lias - MD Contact Type Call Who Is Calling Patient / Member / Family / Caregiver Call Type Triage / Clinical Relationship To Patient Self Return Phone Number [phone] (Primary) Chief Complaint Rash - Widespread Reason for Call Symptomatic / Request for Person has a yeast infection wants to ketoconazole super sense cream. Started on left thigh and was using nystatin cream and switched to this, now under left breast is a large area that's really bright red Translation No Nurse Assessment Nurse: Luvenia Heller, RN, Adriana Date/Time (Eastern Time): 07/05/2019 1:26:05 PM Confirm and document reason for call. If symptomatic, describe symptoms. ---Caller reports being treated for yeast infection to leg. Ketoconazole rx. Caller c/o left sided breast rash, and under panus. Redness. Has the patient had close contact with a person known or suspected to have the novel coronavirus illness OR traveled / lives in area with major community spread (including international travel) in the last 14 days from the onset of symptoms? * If Asymptomatic, screen for exposure and travel within the last 14 days. ---No Does the patient have any new or worsening symptoms? ---Yes Will a triage be completed? ---Yes Related visit to physician within the last 2 weeks? ---Yes Does the PT have any chronic conditions? (i.e. diabetes, asthma, this includes High risk factors for pregnancy, etc.) ---Yes List chronic conditions. ---dm, asthma Is the patient pregnant or possibly pregnant?  (Ask all females between the ages of 64-55) ---No Is this a behavioral health or substance abuse call? ---No Guidelines Guideline Title Affirmed Question Affirmed Notes Nurse Date/Time (Eastern Time) Rash or Redness - Localized [1] Looks infected (spreading redness, pus) Cisneros, RN, Fabio Bering 07/05/2019 1:29:17 PM PLEASE NOTE: All timestamps contained within this report are represented as [place] Standard Time. CONFIDENTIALTY NOTICE: This fax transmission is intended only for the addressee. It contains information that is legally privileged, confidential or otherwise protected from use or disclosure. If you are not the intended recipient, you are strictly prohibited from reviewing, disclosing, copying using or disseminating any of this information or taking any action in reliance on or regarding this information. If you have received this fax in error, please notify us immediately by telephone so that we can arrange for its return to [place]. Phone: [phone], Toll-Free: [phone], Fax: [phone] Page: 2 of 2 Call Id: [id number] Guidelines Guideline Title Affirmed Question Affirmed Notes Nurse Date/Time Eilene Ghazi Time) AND [2] large red area (> 2 in. or 5 cm) Disp. Time Eilene Ghazi Time) Disposition Final User 07/05/2019 1:33:47 PM See HCP within 4 Hours (or PCP triage) Yes Luvenia Heller, RN, Fabio Bering Caller Disagree/Comply Comply Caller Understands Yes PreDisposition Call Doctor Care Advice Given Per Guideline SEE HCP WITHIN 4 HOURS (OR PCP TRIAGE): * IF OFFICE WILL BE OPEN: You need to be seen within the next 3 or 4 hours. Call your doctor (or NP/PA) now or as soon as the office opens. CARE ADVICE given per Rash - Localized and Cause Unknown (Adult) guideline. CALL BACK IF: * You become worse. Referrals GO TO FACILITY REFUSED

## 2019-07-06 NOTE — Telephone Encounter (Signed)
I called patient.  Patient said she has been using Nystatin Cream and the rash is looking better.  Patient said if there's any change she'll call back.

## 2019-07-07 ENCOUNTER — Ambulatory Visit: Payer: Medicare PPO | Admitting: Gastroenterology

## 2019-07-16 ENCOUNTER — Other Ambulatory Visit: Payer: Self-pay

## 2019-07-16 ENCOUNTER — Encounter: Payer: Medicare PPO | Attending: Family Medicine | Admitting: Dietician

## 2019-07-16 ENCOUNTER — Encounter: Payer: Self-pay | Admitting: Dietician

## 2019-07-16 VITALS — Ht 64.0 in | Wt 337.4 lb

## 2019-07-16 DIAGNOSIS — Z713 Dietary counseling and surveillance: Secondary | ICD-10-CM | POA: Diagnosis not present

## 2019-07-16 DIAGNOSIS — E119 Type 2 diabetes mellitus without complications: Secondary | ICD-10-CM | POA: Insufficient documentation

## 2019-07-16 DIAGNOSIS — Z6841 Body Mass Index (BMI) 40.0 and over, adult: Secondary | ICD-10-CM | POA: Insufficient documentation

## 2019-07-16 DIAGNOSIS — E1169 Type 2 diabetes mellitus with other specified complication: Secondary | ICD-10-CM

## 2019-07-16 NOTE — Patient Instructions (Signed)
   Carb + Protein at snacks   Drink at least 8 cups of water a day.  If you have soda, make sure it's diet soda and tea needs to be UNsweet tea   Read the nutrition labels on the frozen meals before purchasing to make sure they meet the guidelines we discussed   Try to eat home more and eat out less

## 2019-07-16 NOTE — Progress Notes (Signed)
Medical Nutrition Therapy: Visit start time: 1100  end time: 1230  Assessment:  Diagnosis: diabetes, obesity Past medical history: HTN, high cholesterol, GI reflux, arthritis, fibromyalgia Psychosocial issues/ stress concerns: none  Preferred learning method:  . Auditory . Visual . Hands-on  Current weight: 5'4"  Height: 337.4 lbs Medications, supplements: reconciled in medical record Labs: A1c- 9.2(H) 05/05/2019, Triglycerides- 214(H)  Progress and evaluation:   Pt reports tried weight watchers for a three-month trial that ended in April but depression made it hard to stick to the guidelines     Pt reports using weight watchers 'on and off for decades'  Pt reports energy is low and when arthritis flares up the pain makes it difficult to do anything   Fasting BG- 158-258; 9 out 11 fasting are >180   Pt reports when she is feeling depressed she craves fast food  Pt reports choking on bread or rice and it often feels "stuck" in her esophagus, may have esophageal procedure in future  Pt reports not eating wheat, grains, or dairy foods- pt feels these foods cause discomfort    Pt reports not eating nuts, seeds, or raw vegetables due to TMJ pain   Physical activity: ADL's  Dietary Intake:  Usual eating pattern includes 3 meals and 2-3 snacks per day. Dining out frequency: 7-9 meals per week.  Breakfast (8 or 9): sometimes fast food Snack: same as below Lunch: [employer] milkshake, [employer] chili, Blue [employer]bon Snack: sweet peppers with hummus, strawberries, watermelon  Supper: pork chops with frozen veggies, chilli or chicken and veggie soup with tortilla chips  Snack: goldfish, sugar free jello, reduced fat and SF pudding cups Beverages: water, sweet tea, diet coke, twist drinks   Nutrition Care Education: Basic nutrition: basic food groups, appropriate nutrient balance, appropriate meal and snack schedule, general nutrition guidelines    Weight control: importance of low sugar  and low fat choices, portion control strategies Advanced nutrition:  cooking techniques, dining out, food label reading Diabetes:  goals for BGs, appropriate meal and snack schedule, appropriate carb intake and balance, healthy carb choices, role of fiber Hypertension:  importance of controlling BP, identifying high sodium foods, identifying food sources of potassium, magnesium Hyperlipidemia:  healthy and unhealthy fats, role of fiber, plant sterols Other lifestyle changes:  benefits of making changes, increasing motivation, readiness for change, identifying habits that need to change  Nutritional Diagnosis:  [place]-2.2 Altered nutrition-related laboratory As related to diabetes.  As evidenced by A1c of 9.2 on 05/05/2019. [place]-3.3 Overweight/obesity As related to excess caloric intake and inadequate physical activity .  As evidenced by pt current BMI of 57.9.  Intervention:   Discussion and instruction as noted above    Pt diet recall seemed incomplete   Discussed importance of eating a variety of foods to ensure adequate nutrition  Discussed how decreasing excess salt and sugar in diet may help to decrease inflammation that causes joint pain and swelling   Pt brought in nutrition labels of foods she frequently consumed.  Reviewed what to look for on a nutrition label and determine if/how it can fit in her nutrition plan   Established goals for additional change  Education Materials given:  . General diet guidelines for Diabetes . NCM Heart Healthy Carb Consistent handout  . Mediterranean diet handout . Plate Planner with food lists . Snacking handout . Goals/ instructions  Learner/ who was taught:  . Patient   Level of understanding: Marland Kitchen Verbalizes/ demonstrates competency  Demonstrated degree of understanding  via:   Teach back Learning barriers: . None  Willingness to learn/ readiness for change: . Acceptance, ready for change  Monitoring and Evaluation:  Dietary intake,  exercise, BG, triglycerides, and body weight      follow up: PRN

## 2019-07-21 DIAGNOSIS — F4312 Post-traumatic stress disorder, chronic: Secondary | ICD-10-CM | POA: Diagnosis not present

## 2019-07-21 DIAGNOSIS — F332 Major depressive disorder, recurrent severe without psychotic features: Secondary | ICD-10-CM | POA: Diagnosis not present

## 2019-07-21 DIAGNOSIS — F410 Panic disorder [episodic paroxysmal anxiety] without agoraphobia: Secondary | ICD-10-CM | POA: Diagnosis not present

## 2019-07-24 ENCOUNTER — Telehealth: Payer: Self-pay | Admitting: Dietician

## 2019-07-24 NOTE — Telephone Encounter (Signed)
Pt called to clarify nutrition recommendations.  Reviewed carb and sodium amounts to look for on the nutrition label of specific packaged foods.  Also reviewed healthy snacking ideas.  Pt mentioned considering enrolling in a weight loss program.  Discussed importance of considering the pros and cons of the program.

## 2019-07-30 ENCOUNTER — Other Ambulatory Visit: Payer: Self-pay | Admitting: Family Medicine

## 2019-08-04 ENCOUNTER — Encounter: Payer: Self-pay | Admitting: Podiatry

## 2019-08-04 ENCOUNTER — Ambulatory Visit: Payer: Medicare PPO | Admitting: Podiatry

## 2019-08-04 ENCOUNTER — Other Ambulatory Visit: Payer: Self-pay

## 2019-08-04 DIAGNOSIS — I890 Lymphedema, not elsewhere classified: Secondary | ICD-10-CM

## 2019-08-04 DIAGNOSIS — B351 Tinea unguium: Secondary | ICD-10-CM | POA: Diagnosis not present

## 2019-08-04 DIAGNOSIS — E0843 Diabetes mellitus due to underlying condition with diabetic autonomic (poly)neuropathy: Secondary | ICD-10-CM | POA: Diagnosis not present

## 2019-08-04 DIAGNOSIS — M79676 Pain in unspecified toe(s): Secondary | ICD-10-CM | POA: Diagnosis not present

## 2019-08-04 NOTE — Progress Notes (Signed)
   SUBJECTIVE Patient with a history of diabetes mellitus presents to office today complaining of elongated, thickened nails that cause pain while ambulating in shoes. She is unable to trim her own nails. She reports continued swelling of the bilateral feet and ankles. Patient is here for further evaluation and treatment. Patient also states that with her shoes she experiences ankle pain bilaterally.  She has been wearing sketchers walking shoes for several months now however she does experience pain with ambulation.   Past Medical History:  Diagnosis Date  . Allergy    allergic rhinitis  . Anemia   . Anxiety   . Arthritis    osteoarthritis  . Asthma   . Claustrophobia    does not like oxygen mask covering face  . Depression   . Diabetes mellitus without complication ([place])   . Fall 2016  . Fatty liver 07/2008   abd. ultrasound - fatty liver ; slt dilated cbd (no stones ) 06/10// abd.  ultrasound normal on 04/2006  . Fibromyalgia   . GERD (gastroesophageal reflux disease)    EGD negative 06/2001// EGD erythematous mucosa, polyp 08/2008  . High uric acid in 24 hour urine specimen   . Hyperhidrosis   . Hyperlipidemia   . Kidney stones   . Lymphedema   . Obesity   . Shortness of breath dyspnea   . Tachycardia   . TMJ (dislocation of temporomandibular joint)   . UTI (lower urinary tract infection)     OBJECTIVE General Patient is awake, alert, and oriented x 3 and in no acute distress. Derm Skin is dry and supple bilateral. Negative open lesions or macerations. Remaining integument unremarkable. Nails are tender, long, thickened and dystrophic with subungual debris, consistent with onychomycosis, 1-5 bilateral. No signs of infection noted. Vasc  Bilateral lower extremity edema noted. DP and PT pedal pulses palpable bilaterally. Temperature gradient within normal limits.  Neuro Epicritic and protective threshold sensation diminished bilaterally.  Musculoskeletal Exam No  symptomatic pedal deformities noted bilateral. Muscular strength within normal limits.  There is some pain on palpation to the ankle joints bilaterally.  ASSESSMENT 1. Diabetes Mellitus w/ peripheral neuropathy 2. Onychomycosis of nail due to dermatophyte bilateral 3. Bilateral lower extremity edema   PLAN OF CARE 1. Patient evaluated today. 2. Instructed to maintain good pedal hygiene and foot care. Stressed importance of controlling blood sugar.  3. Mechanical debridement of nails 1-5 bilaterally performed using a nail nipper. Filed with dremel without incident.  4.  Patient has not been wearing the compression ankle sleeves that were dispensed last visit. 5.  OTC insoles were provided to applied to the inside of the sketchers walking shoes 6.  OTC Miracle Lotion provided for the patient for dry skin    Edrick Kins, DPM [facility]  Dr. Edrick Kins, [address]                                        [place], [place] [address]                Office [phone]  Fax [phone]

## 2019-08-05 DIAGNOSIS — F411 Generalized anxiety disorder: Secondary | ICD-10-CM | POA: Diagnosis not present

## 2019-08-05 DIAGNOSIS — F410 Panic disorder [episodic paroxysmal anxiety] without agoraphobia: Secondary | ICD-10-CM | POA: Diagnosis not present

## 2019-08-05 DIAGNOSIS — F4312 Post-traumatic stress disorder, chronic: Secondary | ICD-10-CM | POA: Diagnosis not present

## 2019-08-05 DIAGNOSIS — F332 Major depressive disorder, recurrent severe without psychotic features: Secondary | ICD-10-CM | POA: Diagnosis not present

## 2019-08-07 DIAGNOSIS — R809 Proteinuria, unspecified: Secondary | ICD-10-CM | POA: Diagnosis not present

## 2019-08-07 DIAGNOSIS — Z794 Long term (current) use of insulin: Secondary | ICD-10-CM | POA: Diagnosis not present

## 2019-08-07 DIAGNOSIS — E1129 Type 2 diabetes mellitus with other diabetic kidney complication: Secondary | ICD-10-CM | POA: Diagnosis not present

## 2019-08-10 DIAGNOSIS — F331 Major depressive disorder, recurrent, moderate: Secondary | ICD-10-CM | POA: Diagnosis not present

## 2019-08-10 DIAGNOSIS — E1169 Type 2 diabetes mellitus with other specified complication: Secondary | ICD-10-CM | POA: Diagnosis not present

## 2019-08-10 DIAGNOSIS — E669 Obesity, unspecified: Secondary | ICD-10-CM | POA: Diagnosis not present

## 2019-08-10 DIAGNOSIS — E1142 Type 2 diabetes mellitus with diabetic polyneuropathy: Secondary | ICD-10-CM | POA: Diagnosis not present

## 2019-08-10 DIAGNOSIS — R809 Proteinuria, unspecified: Secondary | ICD-10-CM | POA: Diagnosis not present

## 2019-08-10 DIAGNOSIS — E1165 Type 2 diabetes mellitus with hyperglycemia: Secondary | ICD-10-CM | POA: Diagnosis not present

## 2019-08-10 DIAGNOSIS — E1122 Type 2 diabetes mellitus with diabetic chronic kidney disease: Secondary | ICD-10-CM | POA: Diagnosis not present

## 2019-08-10 DIAGNOSIS — E1129 Type 2 diabetes mellitus with other diabetic kidney complication: Secondary | ICD-10-CM | POA: Diagnosis not present

## 2019-08-11 ENCOUNTER — Other Ambulatory Visit: Payer: Self-pay

## 2019-08-11 ENCOUNTER — Encounter: Payer: Self-pay | Admitting: Gastroenterology

## 2019-08-11 ENCOUNTER — Ambulatory Visit: Payer: Medicare PPO | Admitting: Gastroenterology

## 2019-08-11 VITALS — BP 129/78 | HR 123 | Temp 97.9°F | Ht 64.0 in | Wt 334.4 lb

## 2019-08-11 DIAGNOSIS — F4312 Post-traumatic stress disorder, chronic: Secondary | ICD-10-CM | POA: Diagnosis not present

## 2019-08-11 DIAGNOSIS — R1310 Dysphagia, unspecified: Secondary | ICD-10-CM

## 2019-08-11 DIAGNOSIS — F411 Generalized anxiety disorder: Secondary | ICD-10-CM | POA: Diagnosis not present

## 2019-08-11 DIAGNOSIS — F332 Major depressive disorder, recurrent severe without psychotic features: Secondary | ICD-10-CM | POA: Diagnosis not present

## 2019-08-11 DIAGNOSIS — F410 Panic disorder [episodic paroxysmal anxiety] without agoraphobia: Secondary | ICD-10-CM | POA: Diagnosis not present

## 2019-08-11 DIAGNOSIS — K582 Mixed irritable bowel syndrome: Secondary | ICD-10-CM

## 2019-08-11 DIAGNOSIS — R1319 Other dysphagia: Secondary | ICD-10-CM

## 2019-08-11 NOTE — H&P (View-Only) (Signed)
Primary Care Physician: Tower, Wynelle Fanny, MD  Primary Gastroenterologist:  Dr. Lucilla Lame  Chief Complaint  Patient presents with  . New Patient (Initial Visit)    Dysphagia    HPI: Mackenzie Bonilla is a 53 y.o. female here for follow-up after being seen by Dr. Marius Ditch in the past.  The patient was seen by Dr. Marius Ditch for irritable bowel syndrome.  She was reported to be put on a low FODMAP diet and to take probiotics.  The patient states that she did not have any relief from these interventions.  The patient has intermittent left lower quadrant pain and states that she has not had any weight loss.  The patient has a BMI of 57.4.  The patient reports that she has alternating diarrhea constipation.  The patient also reports that she has been having dysphagia not only to the right but things like chicken and steak.  She also has problems with bread.  Past Medical History:  Diagnosis Date  . Allergy    allergic rhinitis  . Anemia   . Anxiety   . Arthritis    osteoarthritis  . Asthma   . Claustrophobia    does not like oxygen mask covering face  . Depression   . Diabetes mellitus without complication ([place])   . Fall 2016  . Fatty liver 07/2008   abd. ultrasound - fatty liver ; slt dilated cbd (no stones ) 06/10// abd.  ultrasound normal on 04/2006  . Fibromyalgia   . GERD (gastroesophageal reflux disease)    EGD negative 06/2001// EGD erythematous mucosa, polyp 08/2008  . High uric acid in 24 hour urine specimen   . Hyperhidrosis   . Hyperlipidemia   . Kidney stones   . Lymphedema   . Obesity   . Shortness of breath dyspnea   . Tachycardia   . TMJ (dislocation of temporomandibular joint)   . UTI (lower urinary tract infection)     Current Outpatient Medications  Medication Sig Dispense Refill  . ACCU-CHEK AVIVA PLUS test strip Use to check blood sugar  once a day 100 each 0  . ACCU-CHEK SOFTCLIX LANCETS lancets Use to check blood sugar  once a day 100 each 0  .  acetaminophen (TYLENOL) 500 MG tablet Take 500 mg by mouth every 6 (six) hours as needed.     Marland Kitchen albuterol (VENTOLIN HFA) 108 (90 Base) MCG/ACT inhaler Inhale 2 puffs into the lungs every 6 (six) hours as needed for wheezing. 18 g 3  . ALPRAZolam (XANAX) 1 MG tablet Take 2 mg by mouth 3 (three) times daily as needed.     . Blood Glucose Calibration (ACCU-CHEK AVIVA) SOLN     . Blood Glucose Monitoring Suppl (FIFTY50 GLUCOSE METER 2.0) w/Device KIT Use as directed    . Cyanocobalamin 1000 MCG/ML KIT Inject 1,000 mcg as directed once a week. For 4 weeks, then inject once monthly, please administer at the pharmacy 1 kit 11  . cyclobenzaprine (FLEXERIL) 5 MG tablet TAKE ONE TABLET 3 TIMES DAILY AS NEEDED FOR MUSCLE SPASM 30 tablet 3  . desvenlafaxine (PRISTIQ) 100 MG 24 hr tablet     . dimenhyDRINATE (DRAMAMINE) 50 MG tablet Take 50 mg by mouth every 8 (eight) hours as needed.    . gabapentin (NEURONTIN) 100 MG capsule Take 1 capsule (100 mg total) by mouth 2 (two) times daily. 360 capsule 3  . gabapentin (NEURONTIN) 300 MG capsule Take 1 capsule (300 mg total) by mouth at  bedtime. 90 capsule 3  . glipiZIDE (GLUCOTROL XL) 10 MG 24 hr tablet Take 1 tablet by mouth daily.    Marland Kitchen ibuprofen (ADVIL,MOTRIN) 600 MG tablet Take 1 tablet (600 mg total) by mouth every 8 (eight) hours as needed. 30 tablet 0  . insulin glargine (LANTUS SOLOSTAR) 100 UNIT/ML Solostar Pen Start 15 units nightly. Take between 8 - 10 PM. Adjust up the dose as directed to max daily dose of 50 units.    Marland Kitchen ketoconazole (NIZORAL) 2 % cream Apply 1 application topically daily as needed for irritation. For yeast rash to affected area 30 g 1  . loratadine (CLARITIN) 10 MG tablet Take by mouth.    . losartan (COZAAR) 25 MG tablet     . nystatin (MYCOSTATIN/NYSTOP) powder Apply topically 3 (three) times daily. To affected areas of yeast rash 60 g 3  . nystatin cream (MYCOSTATIN) Apply 1 application topically 3 (three) times daily as needed (for  yeast infection areas). To affected areas 30 g 3  . omeprazole (PRILOSEC) 20 MG capsule TAKE 1 CAPSULE BY MOUTH TWICE DAILY 180 capsule 0  . oxybutynin (DITROPAN XL) 15 MG 24 hr tablet TAKE ONE TABLET BY MOUTH EVERY DAY 90 tablet 2  . Polyethyl Glycol-Propyl Glycol (SYSTANE OP) Apply 1 drop to eye daily as needed.    . pravastatin (PRAVACHOL) 20 MG tablet Take 1 tablet (20 mg total) by mouth at bedtime. 30 tablet 11  . Simethicone (GAS-X PO) Take by mouth.    . spironolactone (ALDACTONE) 100 MG tablet TAKE ONE TABLET EVERY DAY 30 tablet 3  . traMADol (ULTRAM) 50 MG tablet Take 1 tablet (50 mg total) by mouth 2 (two) times daily as needed for severe pain. 30 tablet 0  . traZODone (DESYREL) 150 MG tablet Take 300 mg by mouth at bedtime.     . triamcinolone cream (KENALOG) 0.1 %     . TRULICITY 3 OF/7.5ZW SOPN INJECT 3MG (0.5ML) SUBQ EVERY 7 DAY     No current facility-administered medications for this visit.    Allergies as of 08/11/2019 - Review Complete 08/11/2019  Allergen Reaction Noted  . Cephalexin  10/16/2006  . Codeine  10/16/2006  . Duloxetine  10/16/2006  . Levaquin [levofloxacin] Nausea Only 12/25/2011  . Lisinopril Cough 01/16/2017  . Metformin and related Diarrhea 11/14/2011  . Prednisone  10/16/2006  . Quetiapine Other (See Comments) 10/16/2006  . Sertraline hcl    . Simvastatin Other (See Comments) 07/05/2010  . Triazolam  10/16/2006  . Zaleplon  10/16/2006  . Zocor [simvastatin - high dose]  07/05/2010  . Zolpidem tartrate  10/16/2006  . Hydrocodone Rash 10/16/2006  . Norelgestromin-eth estradiol  10/16/2006    ROS:  General: Negative for anorexia, weight loss, fever, chills, fatigue, weakness. ENT: Negative for hoarseness, difficulty swallowing , nasal congestion. CV: Negative for chest pain, angina, palpitations, dyspnea on exertion, peripheral edema.  Respiratory: Negative for dyspnea at rest, dyspnea on exertion, cough, sputum, wheezing.  GI: See history of  present illness. GU:  Negative for dysuria, hematuria, urinary incontinence, urinary frequency, nocturnal urination.  Endo: Negative for unusual weight change.    Physical Examination:   BP 129/78   Pulse (!) 123   Temp 97.9 F (36.6 C) (Oral)   Ht 5' 4" (1.626 m)   Wt (!) 334 lb 6.4 oz (151.7 kg)   BMI 57.40 kg/m   General: Morbidly obese in no acute distress.  Eyes: No icterus. Conjunctivae pink. Lungs: Clear to auscultation  bilaterally. Non-labored. Heart: Regular rate and rhythm, no murmurs rubs or gallops.  Abdomen: Bowel sounds are normal, nontender, nondistended, no hepatosplenomegaly or masses, no abdominal bruits or hernia , no rebound or guarding.   Extremities: No lower extremity edema. No clubbing or deformities. Neuro: Alert and oriented x 3.  Grossly intact. Skin: Warm and dry, no jaundice.   Psych: Alert and cooperative, normal mood and affect.  Labs:    Imaging Studies: No results found.  Assessment and Plan:   Mackenzie Bonilla is a 54 y.o. y/o female who comes in today with a history of irritable bowel syndrome with alternating diarrhea and constipation.  The patient has been told to take Citrucel daily and she will be set up for an EGD due to her dysphagia.  The patient has been explained the plan and agrees with it.     Lucilla Lame, MD. [facility] Regal    Note: This dictation was prepared with Dragon dictation along with smaller phrase technology. Any transcriptional errors that result from this process are unintentional.

## 2019-08-11 NOTE — Patient Instructions (Signed)
Dr. Allen Norris has recommended you start daily Citrucel.

## 2019-08-11 NOTE — Progress Notes (Signed)
  Primary Care Physician: Tower, Marne A, MD  Primary Gastroenterologist:  Dr. Gustavo Meditz  Chief Complaint  Patient presents with  . New Patient (Initial Visit)    Dysphagia    HPI: Mackenzie Bonilla is a 54 y.o. female here for follow-up after being seen by Dr. Vanga in the past.  The patient was seen by Dr. vanga for irritable bowel syndrome.  She was reported to be put on a low FODMAP diet and to take probiotics.  The patient states that she did not have any relief from these interventions.  The patient has intermittent left lower quadrant pain and states that she has not had any weight loss.  The patient has a BMI of 57.4.  The patient reports that she has alternating diarrhea constipation.  The patient also reports that she has been having dysphagia not only to the right but things like chicken and steak.  She also has problems with bread.  Past Medical History:  Diagnosis Date  . Allergy    allergic rhinitis  . Anemia   . Anxiety   . Arthritis    osteoarthritis  . Asthma   . Claustrophobia    does not like oxygen mask covering face  . Depression   . Diabetes mellitus without complication (HCC)   . Fall 2016  . Fatty liver 07/2008   abd. ultrasound - fatty liver ; slt dilated cbd (no stones ) 06/10// abd.  ultrasound normal on 04/2006  . Fibromyalgia   . GERD (gastroesophageal reflux disease)    EGD negative 06/2001// EGD erythematous mucosa, polyp 08/2008  . High uric acid in 24 hour urine specimen   . Hyperhidrosis   . Hyperlipidemia   . Kidney stones   . Lymphedema   . Obesity   . Shortness of breath dyspnea   . Tachycardia   . TMJ (dislocation of temporomandibular joint)   . UTI (lower urinary tract infection)     Current Outpatient Medications  Medication Sig Dispense Refill  . ACCU-CHEK AVIVA PLUS test strip Use to check blood sugar  once a day 100 each 0  . ACCU-CHEK SOFTCLIX LANCETS lancets Use to check blood sugar  once a day 100 each 0  .  acetaminophen (TYLENOL) 500 MG tablet Take 500 mg by mouth every 6 (six) hours as needed.     . albuterol (VENTOLIN HFA) 108 (90 Base) MCG/ACT inhaler Inhale 2 puffs into the lungs every 6 (six) hours as needed for wheezing. 18 g 3  . ALPRAZolam (XANAX) 1 MG tablet Take 2 mg by mouth 3 (three) times daily as needed.     . Blood Glucose Calibration (ACCU-CHEK AVIVA) SOLN     . Blood Glucose Monitoring Suppl (FIFTY50 GLUCOSE METER 2.0) w/Device KIT Use as directed    . Cyanocobalamin 1000 MCG/ML KIT Inject 1,000 mcg as directed once a week. For 4 weeks, then inject once monthly, please administer at the pharmacy 1 kit 11  . cyclobenzaprine (FLEXERIL) 5 MG tablet TAKE ONE TABLET 3 TIMES DAILY AS NEEDED FOR MUSCLE SPASM 30 tablet 3  . desvenlafaxine (PRISTIQ) 100 MG 24 hr tablet     . dimenhyDRINATE (DRAMAMINE) 50 MG tablet Take 50 mg by mouth every 8 (eight) hours as needed.    . gabapentin (NEURONTIN) 100 MG capsule Take 1 capsule (100 mg total) by mouth 2 (two) times daily. 360 capsule 3  . gabapentin (NEURONTIN) 300 MG capsule Take 1 capsule (300 mg total) by mouth at   bedtime. 90 capsule 3  . glipiZIDE (GLUCOTROL XL) 10 MG 24 hr tablet Take 1 tablet by mouth daily.    . ibuprofen (ADVIL,MOTRIN) 600 MG tablet Take 1 tablet (600 mg total) by mouth every 8 (eight) hours as needed. 30 tablet 0  . insulin glargine (LANTUS SOLOSTAR) 100 UNIT/ML Solostar Pen Start 15 units nightly. Take between 8 - 10 PM. Adjust up the dose as directed to max daily dose of 50 units.    . ketoconazole (NIZORAL) 2 % cream Apply 1 application topically daily as needed for irritation. For yeast rash to affected area 30 g 1  . loratadine (CLARITIN) 10 MG tablet Take by mouth.    . losartan (COZAAR) 25 MG tablet     . nystatin (MYCOSTATIN/NYSTOP) powder Apply topically 3 (three) times daily. To affected areas of yeast rash 60 g 3  . nystatin cream (MYCOSTATIN) Apply 1 application topically 3 (three) times daily as needed (for  yeast infection areas). To affected areas 30 g 3  . omeprazole (PRILOSEC) 20 MG capsule TAKE 1 CAPSULE BY MOUTH TWICE DAILY 180 capsule 0  . oxybutynin (DITROPAN XL) 15 MG 24 hr tablet TAKE ONE TABLET BY MOUTH EVERY DAY 90 tablet 2  . Polyethyl Glycol-Propyl Glycol (SYSTANE OP) Apply 1 drop to eye daily as needed.    . pravastatin (PRAVACHOL) 20 MG tablet Take 1 tablet (20 mg total) by mouth at bedtime. 30 tablet 11  . Simethicone (GAS-X PO) Take by mouth.    . spironolactone (ALDACTONE) 100 MG tablet TAKE ONE TABLET EVERY DAY 30 tablet 3  . traMADol (ULTRAM) 50 MG tablet Take 1 tablet (50 mg total) by mouth 2 (two) times daily as needed for severe pain. 30 tablet 0  . traZODone (DESYREL) 150 MG tablet Take 300 mg by mouth at bedtime.     . triamcinolone cream (KENALOG) 0.1 %     . TRULICITY 3 MG/0.5ML SOPN INJECT 3MG (0.5ML) SUBQ EVERY 7 DAY     No current facility-administered medications for this visit.    Allergies as of 08/11/2019 - Review Complete 08/11/2019  Allergen Reaction Noted  . Cephalexin  10/16/2006  . Codeine  10/16/2006  . Duloxetine  10/16/2006  . Levaquin [levofloxacin] Nausea Only 12/25/2011  . Lisinopril Cough 01/16/2017  . Metformin and related Diarrhea 11/14/2011  . Prednisone  10/16/2006  . Quetiapine Other (See Comments) 10/16/2006  . Sertraline hcl    . Simvastatin Other (See Comments) 07/05/2010  . Triazolam  10/16/2006  . Zaleplon  10/16/2006  . Zocor [simvastatin - high dose]  07/05/2010  . Zolpidem tartrate  10/16/2006  . Hydrocodone Rash 10/16/2006  . Norelgestromin-eth estradiol  10/16/2006    ROS:  General: Negative for anorexia, weight loss, fever, chills, fatigue, weakness. ENT: Negative for hoarseness, difficulty swallowing , nasal congestion. CV: Negative for chest pain, angina, palpitations, dyspnea on exertion, peripheral edema.  Respiratory: Negative for dyspnea at rest, dyspnea on exertion, cough, sputum, wheezing.  GI: See history of  present illness. GU:  Negative for dysuria, hematuria, urinary incontinence, urinary frequency, nocturnal urination.  Endo: Negative for unusual weight change.    Physical Examination:   BP 129/78   Pulse (!) 123   Temp 97.9 F (36.6 C) (Oral)   Ht 5' 4" (1.626 m)   Wt (!) 334 lb 6.4 oz (151.7 kg)   BMI 57.40 kg/m   General: Morbidly obese in no acute distress.  Eyes: No icterus. Conjunctivae pink. Lungs: Clear to auscultation   bilaterally. Non-labored. Heart: Regular rate and rhythm, no murmurs rubs or gallops.  Abdomen: Bowel sounds are normal, nontender, nondistended, no hepatosplenomegaly or masses, no abdominal bruits or hernia , no rebound or guarding.   Extremities: No lower extremity edema. No clubbing or deformities. Neuro: Alert and oriented x 3.  Grossly intact. Skin: Warm and dry, no jaundice.   Psych: Alert and cooperative, normal mood and affect.  Labs:    Imaging Studies: No results found.  Assessment and Plan:   Mackenzie Bonilla is a 54 y.o. y/o female who comes in today with a history of irritable bowel syndrome with alternating diarrhea and constipation.  The patient has been told to take Citrucel daily and she will be set up for an EGD due to her dysphagia.  The patient has been explained the plan and agrees with it.     Lucilla Lame, MD. Marval Regal    Note: This dictation was prepared with Dragon dictation along with smaller phrase technology. Any transcriptional errors that result from this process are unintentional.

## 2019-08-12 ENCOUNTER — Other Ambulatory Visit: Payer: Self-pay

## 2019-08-12 DIAGNOSIS — R1319 Other dysphagia: Secondary | ICD-10-CM

## 2019-08-14 ENCOUNTER — Encounter: Payer: Self-pay | Admitting: Gastroenterology

## 2019-08-14 ENCOUNTER — Other Ambulatory Visit
Admission: RE | Admit: 2019-08-14 | Discharge: 2019-08-14 | Disposition: A | Discharge status: Home / Self Care | Payer: Medicare PPO | Source: Ambulatory Visit | Attending: Gastroenterology | Admitting: Gastroenterology

## 2019-08-14 ENCOUNTER — Other Ambulatory Visit: Payer: Self-pay

## 2019-08-14 DIAGNOSIS — Z20822 Contact with and (suspected) exposure to covid-19: Secondary | ICD-10-CM | POA: Diagnosis not present

## 2019-08-14 DIAGNOSIS — Z01812 Encounter for preprocedural laboratory examination: Secondary | ICD-10-CM | POA: Insufficient documentation

## 2019-08-15 LAB — SARS CORONAVIRUS 2 (TAT 6-24 HRS): SARS Coronavirus 2: NEGATIVE

## 2019-08-18 ENCOUNTER — Ambulatory Visit: Payer: Medicare PPO | Admitting: Anesthesiology

## 2019-08-18 ENCOUNTER — Ambulatory Visit
Admission: RE | Admit: 2019-08-18 | Discharge: 2019-08-18 | Disposition: A | Discharge status: Home / Self Care | Payer: Medicare PPO | Attending: Gastroenterology | Admitting: Gastroenterology

## 2019-08-18 ENCOUNTER — Other Ambulatory Visit: Payer: Self-pay

## 2019-08-18 ENCOUNTER — Encounter
Admission: RE | Disposition: A | Discharge status: Home / Self Care | Payer: Self-pay | Source: Home / Self Care | Attending: Gastroenterology

## 2019-08-18 ENCOUNTER — Encounter: Payer: Self-pay | Admitting: Gastroenterology

## 2019-08-18 DIAGNOSIS — F418 Other specified anxiety disorders: Secondary | ICD-10-CM | POA: Diagnosis not present

## 2019-08-18 DIAGNOSIS — E119 Type 2 diabetes mellitus without complications: Secondary | ICD-10-CM | POA: Insufficient documentation

## 2019-08-18 DIAGNOSIS — K760 Fatty (change of) liver, not elsewhere classified: Secondary | ICD-10-CM | POA: Diagnosis not present

## 2019-08-18 DIAGNOSIS — M1990 Unspecified osteoarthritis, unspecified site: Secondary | ICD-10-CM | POA: Insufficient documentation

## 2019-08-18 DIAGNOSIS — Z794 Long term (current) use of insulin: Secondary | ICD-10-CM | POA: Diagnosis not present

## 2019-08-18 DIAGNOSIS — K580 Irritable bowel syndrome with diarrhea: Secondary | ICD-10-CM | POA: Insufficient documentation

## 2019-08-18 DIAGNOSIS — E669 Obesity, unspecified: Secondary | ICD-10-CM | POA: Diagnosis not present

## 2019-08-18 DIAGNOSIS — Z79899 Other long term (current) drug therapy: Secondary | ICD-10-CM | POA: Diagnosis not present

## 2019-08-18 DIAGNOSIS — Z6841 Body Mass Index (BMI) 40.0 and over, adult: Secondary | ICD-10-CM | POA: Insufficient documentation

## 2019-08-18 DIAGNOSIS — E785 Hyperlipidemia, unspecified: Secondary | ICD-10-CM | POA: Diagnosis not present

## 2019-08-18 DIAGNOSIS — F329 Major depressive disorder, single episode, unspecified: Secondary | ICD-10-CM | POA: Insufficient documentation

## 2019-08-18 DIAGNOSIS — J45909 Unspecified asthma, uncomplicated: Secondary | ICD-10-CM | POA: Diagnosis not present

## 2019-08-18 DIAGNOSIS — M797 Fibromyalgia: Secondary | ICD-10-CM | POA: Diagnosis not present

## 2019-08-18 DIAGNOSIS — F419 Anxiety disorder, unspecified: Secondary | ICD-10-CM | POA: Diagnosis not present

## 2019-08-18 DIAGNOSIS — R1310 Dysphagia, unspecified: Secondary | ICD-10-CM | POA: Diagnosis not present

## 2019-08-18 DIAGNOSIS — K2970 Gastritis, unspecified, without bleeding: Secondary | ICD-10-CM | POA: Diagnosis not present

## 2019-08-18 DIAGNOSIS — R1319 Other dysphagia: Secondary | ICD-10-CM

## 2019-08-18 DIAGNOSIS — K2950 Unspecified chronic gastritis without bleeding: Secondary | ICD-10-CM | POA: Diagnosis not present

## 2019-08-18 DIAGNOSIS — K219 Gastro-esophageal reflux disease without esophagitis: Secondary | ICD-10-CM | POA: Insufficient documentation

## 2019-08-18 DIAGNOSIS — E1142 Type 2 diabetes mellitus with diabetic polyneuropathy: Secondary | ICD-10-CM | POA: Diagnosis not present

## 2019-08-18 HISTORY — PX: ESOPHAGOGASTRODUODENOSCOPY (EGD) WITH PROPOFOL: SHX5813

## 2019-08-18 LAB — GLUCOSE, CAPILLARY: Glucose-Capillary: 241 mg/dL — ABNORMAL HIGH (ref 70–99)

## 2019-08-18 SURGERY — ESOPHAGOGASTRODUODENOSCOPY (EGD) WITH PROPOFOL
Anesthesia: General

## 2019-08-18 MED ORDER — SODIUM CHLORIDE 0.9 % IV SOLN: INTRAVENOUS | Status: DC

## 2019-08-18 MED ORDER — PROPOFOL 500 MG/50ML IV EMUL
INTRAVENOUS | Status: AC
  Filled 2019-08-18: qty 50

## 2019-08-18 MED ORDER — ONDANSETRON HCL 4 MG/2ML IJ SOLN
INTRAMUSCULAR | Status: DC | PRN
  Administered 2019-08-18: 4 mg via INTRAVENOUS

## 2019-08-18 MED ORDER — PROPOFOL 10 MG/ML IV BOLUS
INTRAVENOUS | Status: DC | PRN
  Administered 2019-08-18 (×2): 50 mg via INTRAVENOUS
  Administered 2019-08-18: 20 mg via INTRAVENOUS

## 2019-08-18 NOTE — Interval H&P Note (Signed)
History and Physical Interval Note:  08/18/2019 7:53 AM  Mackenzie Bonilla  has presented today for surgery, with the diagnosis of Dysphagia R13.10.  The various methods of treatment have been discussed with the patient and family. After consideration of risks, benefits and other options for treatment, the patient has consented to  Procedure(s): ESOPHAGOGASTRODUODENOSCOPY (EGD) WITH PROPOFOL (N/A) as a surgical intervention.  The patient's history has been reviewed, patient examined, no change in status, stable for surgery.  I have reviewed the patient's chart and labs.  Questions were answered to the patient's satisfaction.     Narayan Scull [employer]

## 2019-08-18 NOTE — Transfer of Care (Signed)
Immediate Anesthesia Transfer of Care Note  Patient: Mackenzie Bonilla  Procedure(s) Performed: ESOPHAGOGASTRODUODENOSCOPY (EGD) WITH PROPOFOL (N/A )  Patient Location: PACU and Endoscopy Unit  Anesthesia Type:General  Level of Consciousness: awake, alert  and oriented  Airway & Oxygen Therapy: Patient Spontanous Breathing and Patient connected to nasal cannula oxygen  Post-op Assessment: Report given to RN and Post -op Vital signs reviewed and stable  Post vital signs: Reviewed and stable  Last Vitals:  Vitals Value Taken Time  BP    Temp    Pulse    Resp    SpO2      Last Pain:  Vitals:   08/18/19 0805  TempSrc: Temporal  PainSc: 6          Complications: No complications documented.

## 2019-08-18 NOTE — Op Note (Addendum)
[name] Gastroenterology Patient Name: Mackenzie Bonilla Procedure Date: 08/18/2019 8:20 AM MRN: [id number] Account #: [id number] Date of Birth: 1965-04-02 Admit Type: Outpatient Age: 54 Room: [facility] ENDO ROOM 4 Gender: Female Note Status: Finalized Procedure:             Upper GI endoscopy Indications:           Dysphagia Providers:             Lucilla Lame MD, MD Medicines:             Propofol per Anesthesia Complications:         No immediate complications. Procedure:             Pre-Anesthesia Assessment:                        - Prior to the procedure, a History and Physical was                         performed, and patient medications and allergies were                         reviewed. The patient's tolerance of previous                         anesthesia was also reviewed. The risks and benefits                         of the procedure and the sedation options and risks                         were discussed with the patient. All questions were                         answered, and informed consent was obtained. Prior                         Anticoagulants: The patient has taken no previous                         anticoagulant or antiplatelet agents. ASA Grade                         Assessment: III - A patient with severe systemic                         disease. After reviewing the risks and benefits, the                         patient was deemed in satisfactory condition to                         undergo the procedure.                        After obtaining informed consent, the endoscope was                         passed under direct vision. Throughout the procedure,  the patient's blood pressure, pulse, and oxygen                         saturations were monitored continuously. The Endoscope                         was introduced through the mouth, and advanced to the                         second part of duodenum. The  upper GI endoscopy was                         accomplished without difficulty. The patient tolerated                         the procedure well. Findings:      The examined esophagus was normal.      Localized mild inflammation characterized by erythema was found in the       gastric antrum. Biopsies were taken with a cold forceps for histology.      The examined duodenum was normal.      A TTS dilator was passed through the scope. Dilation with a 15-16.5-18       mm balloon dilator was performed to 18 mm in the entire esophagus. Impression:            - Normal esophagus.                        - Gastritis. Biopsied.                        - Normal examined duodenum.                        - Dilation performed in the entire esophagus. Recommendation:        - Discharge patient to home.                        - Resume previous diet.                        - Continue present medications.                        - Await pathology results. Procedure Code(s):     --- Professional ---                        [phone], Esophagogastroduodenoscopy, flexible,                         transoral; with transendoscopic balloon dilation of                         esophagus (less than 30 mm diameter)                        43239, 59, Esophagogastroduodenoscopy, flexible,                         transoral; with biopsy, single or multiple Diagnosis Code(s):     ---  Professional ---                        R13.10, Dysphagia, unspecified                        K29.70, Gastritis, unspecified, without bleeding CPT copyright 2019 American Medical Association. All rights reserved. The codes documented in this report are preliminary and upon coder review may  be revised to meet current compliance requirements. Lucilla Lame MD, MD 08/18/2019 8:39:29 AM This report has been signed electronically. Number of Addenda: 0 Note Initiated On: 08/18/2019 8:20 AM Estimated Blood Loss:  Estimated blood loss: none. Estimated  blood loss: none.      [facility] [employer]

## 2019-08-18 NOTE — Anesthesia Preprocedure Evaluation (Signed)
Anesthesia Evaluation  Patient identified by MRN, date of birth, ID band Patient awake    Reviewed: Allergy & Precautions, H&P , NPO status , Patient's Chart, lab work & pertinent test results, reviewed documented beta blocker date and time   History of Anesthesia Complications Negative for: history of anesthetic complications  Airway Mallampati: IV  TM Distance: >3 FB Neck ROM: full    Dental  (+) Caps, Dental Advidsory Given, Teeth Intact   Pulmonary shortness of breath and with exertion, asthma , sleep apnea (likely based on history, but no diagnosis) , neg COPD, neg recent URI,    Pulmonary exam normal breath sounds clear to auscultation       Cardiovascular Exercise Tolerance: Good negative cardio ROS Normal cardiovascular exam Rhythm:regular Rate:Normal     Neuro/Psych neg Seizures PSYCHIATRIC DISORDERS Anxiety Depression  Neuromuscular disease    GI/Hepatic Neg liver ROS, GERD  ,  Endo/Other  diabetesMorbid obesity  Renal/GU Renal disease  negative genitourinary   Musculoskeletal   Abdominal   Peds  Hematology negative hematology ROS (+)   Anesthesia Other Findings Past Medical History: No date: Allergy     Comment:  allergic rhinitis No date: Anemia No date: Anxiety No date: Arthritis     Comment:  osteoarthritis No date: Asthma No date: Claustrophobia     Comment:  does not like oxygen mask covering face No date: Depression No date: Diabetes mellitus without complication ([place]) 3976: Fall 07/2008: Fatty liver     Comment:  abd. ultrasound - fatty liver ; slt dilated cbd (no               stones ) 06/10// abd.  ultrasound normal on 04/2006 No date: Fibromyalgia No date: GERD (gastroesophageal reflux disease)     Comment:  EGD negative 06/2001// EGD erythematous mucosa, polyp               08/2008 No date: High uric acid in 24 hour urine specimen No date: Hyperhidrosis No date: Hyperlipidemia No  date: Kidney stones No date: Lymphedema No date: Obesity No date: Shortness of breath dyspnea No date: Tachycardia No date: TMJ (dislocation of temporomandibular joint) No date: UTI (lower urinary tract infection)   Reproductive/Obstetrics negative OB ROS                             Anesthesia Physical Anesthesia Plan  ASA: III  Anesthesia Plan: General   Post-op Pain Management:    Induction: Intravenous  PONV Risk Score and Plan: 3 and Propofol infusion and TIVA  Airway Management Planned: Natural Airway and Nasal Cannula  Additional Equipment:   Intra-op Plan:   Post-operative Plan:   Informed Consent: I have reviewed the patients History and Physical, chart, labs and discussed the procedure including the risks, benefits and alternatives for the proposed anesthesia with the patient or authorized representative who has indicated his/her understanding and acceptance.     Dental Advisory Given  Plan Discussed with: Anesthesiologist, CRNA and Surgeon  Anesthesia Plan Comments:         Anesthesia Quick Evaluation

## 2019-08-18 NOTE — Anesthesia Postprocedure Evaluation (Signed)
Anesthesia Post Note  Patient: CELE MOTE  Procedure(s) Performed: ESOPHAGOGASTRODUODENOSCOPY (EGD) WITH PROPOFOL (N/A )  Patient location during evaluation: Endoscopy Anesthesia Type: General Level of consciousness: awake and alert Pain management: pain level controlled Vital Signs Assessment: post-procedure vital signs reviewed and stable Respiratory status: spontaneous breathing, nonlabored ventilation, respiratory function stable and patient connected to nasal cannula oxygen Cardiovascular status: blood pressure returned to baseline and stable Postop Assessment: no apparent nausea or vomiting Anesthetic complications: no   No complications documented.   Last Vitals:  Vitals:   08/18/19 0902 08/18/19 0912  BP: 111/65 98/69  Pulse: 77 74  Resp: 10 17  Temp:    SpO2: 97% 100%    Last Pain:  Vitals:   08/18/19 0912  TempSrc:   PainSc: 0-No pain                 Martha Clan

## 2019-08-19 ENCOUNTER — Encounter: Payer: Self-pay | Admitting: Gastroenterology

## 2019-08-19 LAB — SURGICAL PATHOLOGY

## 2019-08-20 DIAGNOSIS — F332 Major depressive disorder, recurrent severe without psychotic features: Secondary | ICD-10-CM | POA: Diagnosis not present

## 2019-08-20 DIAGNOSIS — F4312 Post-traumatic stress disorder, chronic: Secondary | ICD-10-CM | POA: Diagnosis not present

## 2019-08-20 DIAGNOSIS — F410 Panic disorder [episodic paroxysmal anxiety] without agoraphobia: Secondary | ICD-10-CM | POA: Diagnosis not present

## 2019-08-20 DIAGNOSIS — F411 Generalized anxiety disorder: Secondary | ICD-10-CM | POA: Diagnosis not present

## 2019-08-22 ENCOUNTER — Other Ambulatory Visit: Payer: Self-pay | Admitting: Family Medicine

## 2019-08-25 ENCOUNTER — Telehealth: Payer: Self-pay | Admitting: Gastroenterology

## 2019-08-25 ENCOUNTER — Encounter: Payer: Self-pay | Admitting: Gastroenterology

## 2019-08-25 NOTE — Telephone Encounter (Signed)
Patient calling back to know what to do with her results from colon on 7.6.21. Pt also states she has started having a hard cough and wants to know if it could be related to her GI issues.

## 2019-08-28 DIAGNOSIS — H02822 Cysts of right lower eyelid: Secondary | ICD-10-CM | POA: Diagnosis not present

## 2019-08-28 NOTE — Telephone Encounter (Signed)
Mychart message has been sent to pt.

## 2019-09-04 DIAGNOSIS — F411 Generalized anxiety disorder: Secondary | ICD-10-CM | POA: Diagnosis not present

## 2019-09-04 DIAGNOSIS — F410 Panic disorder [episodic paroxysmal anxiety] without agoraphobia: Secondary | ICD-10-CM | POA: Diagnosis not present

## 2019-09-04 DIAGNOSIS — F332 Major depressive disorder, recurrent severe without psychotic features: Secondary | ICD-10-CM | POA: Diagnosis not present

## 2019-09-04 DIAGNOSIS — F4312 Post-traumatic stress disorder, chronic: Secondary | ICD-10-CM | POA: Diagnosis not present

## 2019-09-11 ENCOUNTER — Telehealth: Payer: Self-pay

## 2019-09-11 NOTE — Telephone Encounter (Signed)
Pt notified of Dr. Marliss Coots comments. Pt was using miralax but only once daily so she will increase it to TID as PCP recommended.   Pt also let me know she was having trouble giving herself the insulin injections saying it feels like there is "resistance when she administers it" and once even bent the insulin needle. I advise that she needs to call her endo doc asap to let them know. They may need to help her with additional teaching or change the needle or pin size given the difficulty she is having. Pt verbalized understanding and said she will call them now.   FYI to PCP so she is aware

## 2019-09-11 NOTE — Telephone Encounter (Signed)
I don't think that insulin causes constipation.  I recommend miralax (if not already taking) 3 times daily until she begins having more normal bms and then titrate use to need  If severe abd pain /vomiting do go to ER

## 2019-09-11 NOTE — Telephone Encounter (Signed)
[facility] - Client TELEPHONE ADVICE RECORD [facility] Patient Name: Mackenzie Bonilla Gender: Female DOB: 05/24/1965 Age: 54 Y 52 M 23 D Return Phone Number: [phone] (Primary) Address: [address] City/State/Zip: [place] [address] Client [facility] - Client Client Site [place] MD Contact Type Call Who Is Calling Patient / Member / Family / Caregiver Call Type Triage / Clinical Relationship To Patient Self Return Phone Number [phone] (Primary) Chief Complaint Constipation Reason for Call Symptomatic / Request for Ona states she is constipated and it has been going on for 2-3 weeks. She states she has only used the bathroom successfully only 1 time. She feels very bloated and it is affecting her insulin injections. Translation No Nurse Assessment Nurse: Noberto Retort, RN, Malvin Johns Date/Time Eilene Ghazi Time): 09/11/2019 12:03:01 PM Confirm and document reason for call. If symptomatic, describe symptoms. ---Caller states she is constipated and it has been going on for 2-3 weeks. She states she has only used the bathroom successfully only 1 time. She wonders if her insulin may be causing this. No fever. No other s/s Has the patient had close contact with a person known or suspected to have the novel coronavirus illness OR traveled / lives in area with major community spread (including international travel) in the last 14 days from the onset of symptoms? * If Asymptomatic, screen for exposure and travel within the last 14 days. ---No Does the patient have any new or worsening symptoms? ---Yes Will a triage be completed? ---Yes Related visit to physician within the last 2 weeks? ---No Does the PT have any chronic conditions? (i.e. diabetes, asthma, this includes High risk factors for pregnancy, etc.) ---Yes List  chronic conditions. ---DM, Lymphedema, depression. Is the patient pregnant or possibly pregnant? (Ask all females between the ages of 37-55) ---No Is this a behavioral health or substance abuse call? ---No PLEASE NOTE: All timestamps contained within this report are represented as [place] Standard Time. CONFIDENTIALTY NOTICE: This fax transmission is intended only for the addressee. It contains information that is legally privileged, confidential or otherwise protected from use or disclosure. If you are not the intended recipient, you are strictly prohibited from reviewing, disclosing, copying using or disseminating any of this information or taking any action in reliance on or regarding this information. If you have received this fax in error, please notify us immediately by telephone so that we can arrange for its return to [place]. Phone: [phone], Toll-Free: [phone], Fax: [phone] Page: 2 of 2 Call Id: [id number] Guidelines Guideline Title Affirmed Question Affirmed Notes Nurse Date/Time Eilene Ghazi Time) Constipation Abdomen is more swollen than usual Nino Parsley 09/11/2019 12:04:37 PM Disp. Time Eilene Ghazi Time) Disposition Final User 09/11/2019 12:07:13 PM See PCP within 24 Hours Yes Noberto Retort, RN, Malvin Johns Caller Disagree/Comply Comply Caller Understands Yes PreDisposition InappropriateToAsk Care Advice Given Per Guideline SEE PCP WITHIN 24 HOURS: * IF OFFICE WILL BE OPEN: You need to be examined within the next 24 hours. Call your doctor (or NP/PA) when the office opens and make an appointment. GENERAL CONSTIPATION INSTRUCTIONS: * Eat a high fiber diet. * Drink adequate liquids. CALL BACK IF: * Severe or increasing abdominal pain * Constant abdominal pain lasting over 2 hours * You become worse. * Vomiting occurs CARE ADVICE given per Constipation (Adult) guideline. Comments User: Marya Landry, RN Date/Time Eilene Ghazi Time): 09/11/2019 12:04:20 PM Last Blood Glucose:  210 Referrals REFERRED TO PCP OFFICE

## 2019-09-16 DIAGNOSIS — H6122 Impacted cerumen, left ear: Secondary | ICD-10-CM | POA: Diagnosis not present

## 2019-09-16 DIAGNOSIS — E119 Type 2 diabetes mellitus without complications: Secondary | ICD-10-CM | POA: Diagnosis not present

## 2019-09-16 DIAGNOSIS — H9012 Conductive hearing loss, unilateral, left ear, with unrestricted hearing on the contralateral side: Secondary | ICD-10-CM | POA: Diagnosis not present

## 2019-09-16 DIAGNOSIS — H9120 Sudden idiopathic hearing loss, unspecified ear: Secondary | ICD-10-CM | POA: Diagnosis not present

## 2019-09-17 ENCOUNTER — Other Ambulatory Visit: Payer: Self-pay | Admitting: Family Medicine

## 2019-09-17 ENCOUNTER — Telehealth: Payer: Self-pay | Admitting: Dietician

## 2019-09-17 ENCOUNTER — Telehealth: Payer: Self-pay | Admitting: Family Medicine

## 2019-09-17 NOTE — Telephone Encounter (Signed)
Patient called in stating ENT MD is wanting to start on prednisone for her left ear, however patient has some reservations as she is worried it will increase her blood sugars. ENT is wanting her to start medication today. Please advise if patient is able to take medication without severe side effects.

## 2019-09-17 NOTE — Telephone Encounter (Signed)
Pt notified of Dr. Marliss Coots comments. Pt said she did call endo and they are squeezing her in for an appt tomorrow to discuss the use of her prednisone/ blood sugars

## 2019-09-17 NOTE — Telephone Encounter (Signed)
Pt left message about needing help with lower salt recommendation from MD. Spoke with pt about recommendations to decrease sources of sodium in diet.  Also reviewed importance of low sugar/no sugar added foods.

## 2019-09-17 NOTE — Telephone Encounter (Signed)
This will increase her blood sugar while she is on it - and get back to normal when she finishes.  She may want to alert her endocrinologist and be ready for this.   If she needs it however- will have to go forward

## 2019-09-18 DIAGNOSIS — H8103 Meniere's disease, bilateral: Secondary | ICD-10-CM | POA: Diagnosis not present

## 2019-09-18 DIAGNOSIS — E1165 Type 2 diabetes mellitus with hyperglycemia: Secondary | ICD-10-CM | POA: Diagnosis not present

## 2019-09-18 DIAGNOSIS — E1142 Type 2 diabetes mellitus with diabetic polyneuropathy: Secondary | ICD-10-CM | POA: Diagnosis not present

## 2019-09-18 DIAGNOSIS — E1169 Type 2 diabetes mellitus with other specified complication: Secondary | ICD-10-CM | POA: Diagnosis not present

## 2019-09-18 DIAGNOSIS — E669 Obesity, unspecified: Secondary | ICD-10-CM | POA: Diagnosis not present

## 2019-09-18 DIAGNOSIS — E1122 Type 2 diabetes mellitus with diabetic chronic kidney disease: Secondary | ICD-10-CM | POA: Diagnosis not present

## 2019-09-18 DIAGNOSIS — E1129 Type 2 diabetes mellitus with other diabetic kidney complication: Secondary | ICD-10-CM | POA: Diagnosis not present

## 2019-09-18 DIAGNOSIS — N1831 Chronic kidney disease, stage 3a: Secondary | ICD-10-CM | POA: Diagnosis not present

## 2019-09-18 DIAGNOSIS — R809 Proteinuria, unspecified: Secondary | ICD-10-CM | POA: Diagnosis not present

## 2019-09-24 ENCOUNTER — Telehealth: Payer: Self-pay

## 2019-09-24 NOTE — Telephone Encounter (Signed)
Agree with that advisement  Thanks so much

## 2019-09-24 NOTE — Telephone Encounter (Signed)
Pt called back to let office know endocrinology called to advise pt to reduce lantis to 44 units from 54 units. Pt said she ate 2 cheese burgers and some sweet tea and was now sleepy. Pt said she had not taken her BG again. Advised pt to take while on the phone. BG while on the phone was 201. Advised pt to make sure she always kept her emergency pack ready and to carry glucose tabs with her at all times. Also to f/u with endocrinology. Pt verbalized understanding and was appreciative.

## 2019-09-24 NOTE — Telephone Encounter (Signed)
Larene Beach RN asked me to call pts mother to bring pt a sweet drink and something to eat due to low blood sugar. I spoke with pts mom and she will take pt a sweet drink and food and will be there within 15".Larene Beach RN voiced understanding.

## 2019-09-24 NOTE — Telephone Encounter (Signed)
Pt c/o hypoglycemia today, BG at 8am of 59, dizzy and lightheaded. Pt started new diet last week so she has reduced salts and sugars in her diet. Pt has also started prednisone. Before call today pt ate 1 tablespoon of low sodium peanut butter.  Pt reports decreasing AM BG over the last 4 days. Mon it was 222, Tues, 102, Wed 80 and today 59.   Advised pt to eat peanut butter crackers. Pt did not have any soda or juice in the house. No candy or glucose tabs. All snack bars were sugar free. All pt had was one pack of peanut butter nabs. Pt ate three crackers and BG was rechecked after 20 minutes. BG was still 59. Pt ate more crackers and asked that this nurse call her mother, who lives 5 minutes away to bring her some high glucose food. This nurse had another nurse contact pt's mother while this nurse stayed on the phone with the pt.  Pt ate two more crackers and reported she still felt like her hands were shaky.  Pt rechecked glucose after 15 more minutes and glucose was 74. Pt's mother came to residence with cake, cookies, juice and sweet tea. Advised to continue eating and rechecking BG until glucose is over 100. Educated pt and mother on signs and symptoms of hypoglycemia and making an emergency kit for hypoglycemia in the future. Pt's mother is staying with pt the remainder of the day. Advised to f/u with endocrinology and if anything else is needed to feel free to contact this office. Pt appreciative and verbalized understanding.

## 2019-09-25 DIAGNOSIS — M5431 Sciatica, right side: Secondary | ICD-10-CM | POA: Diagnosis not present

## 2019-09-25 DIAGNOSIS — M9904 Segmental and somatic dysfunction of sacral region: Secondary | ICD-10-CM | POA: Diagnosis not present

## 2019-09-25 DIAGNOSIS — M9903 Segmental and somatic dysfunction of lumbar region: Secondary | ICD-10-CM | POA: Diagnosis not present

## 2019-09-25 DIAGNOSIS — M9905 Segmental and somatic dysfunction of pelvic region: Secondary | ICD-10-CM | POA: Diagnosis not present

## 2019-09-25 NOTE — Telephone Encounter (Signed)
Pt called to report her BG was 62 this morning and she ate crackers and then went to the drug store and got solution for the glucometer and it was in range. She also stopped at her chiropractor for severe back spasms and she helped and her pharmacist said she could take 600mg  ibuprofen OTC  TID if she needed it. Advised to take only as written on bottle and if she needed more she would need to discuss this with PCP. Pt verbalized understanding.

## 2019-09-25 NOTE — Telephone Encounter (Signed)
Pt would like a call back. She said she has been talking to you but not give me any information besides it was important that you called her.

## 2019-09-28 ENCOUNTER — Telehealth: Payer: Self-pay | Admitting: Family Medicine

## 2019-09-28 NOTE — Telephone Encounter (Signed)
Patient called in stating she has been increasingly worse muscle spasms for the past 2-3 weeks. Patient stated she is taking flexirl and ibuprofen but they are not helping. Patient is in extreme pain today and says it is taking her breathe away and she feels like she cannot move. Patient declined triage and stated she does not want to go to ER or urgent care due to covid. Patient wants to know what PCP advises. Please advise.

## 2019-09-28 NOTE — Telephone Encounter (Signed)
Spoke to pt. Says it feels like spasms in the middle of her back. Hard to catch her breath when it "catches". Worsens when she twists. She took a Flexeril and Tylenol 500mg  and it seems to have settled the spasms. No fever or any other symptoms.

## 2019-09-28 NOTE — Telephone Encounter (Signed)
Please get more info re: muscle spasms  Where? Like cramps?  Any fever or other symptoms ? Thanks

## 2019-09-29 DIAGNOSIS — M5414 Radiculopathy, thoracic region: Secondary | ICD-10-CM | POA: Diagnosis not present

## 2019-09-29 DIAGNOSIS — G8929 Other chronic pain: Secondary | ICD-10-CM | POA: Diagnosis not present

## 2019-09-29 DIAGNOSIS — M5441 Lumbago with sciatica, right side: Secondary | ICD-10-CM | POA: Diagnosis not present

## 2019-09-29 DIAGNOSIS — M5442 Lumbago with sciatica, left side: Secondary | ICD-10-CM | POA: Diagnosis not present

## 2019-09-29 DIAGNOSIS — M546 Pain in thoracic spine: Secondary | ICD-10-CM | POA: Diagnosis not present

## 2019-09-29 NOTE — Telephone Encounter (Signed)
Patient left a voicemail stating that she is feeling a lot worse.  Called patient back and gave her the information from Dr. Glori Bickers. Patient stated that she is at the Urgent Care at [facility] and they told her that they are not able to do x-rays. Patient stated that she feels that she needs an x-ray done. Patient was given the information on the Urgent Care at [facility] because they are able to do x-rays. Patient stated that she will go to [facility] to be seen.

## 2019-09-29 NOTE — Telephone Encounter (Signed)
Thanks- -I will continue to watch for correspondence  Agree with that advisement

## 2019-09-29 NOTE — Telephone Encounter (Signed)
Add some warm compresses /stretching If worse or sob-will need to be seen in UC Keep me posted please

## 2019-09-30 ENCOUNTER — Other Ambulatory Visit: Payer: Self-pay | Admitting: Family Medicine

## 2019-09-30 ENCOUNTER — Ambulatory Visit
Admission: RE | Admit: 2019-09-30 | Discharge: 2019-09-30 | Disposition: A | Discharge status: Home / Self Care | Payer: Medicare PPO | Source: Ambulatory Visit | Attending: Internal Medicine | Admitting: Internal Medicine

## 2019-09-30 ENCOUNTER — Other Ambulatory Visit: Payer: Self-pay

## 2019-09-30 ENCOUNTER — Ambulatory Visit (INDEPENDENT_AMBULATORY_CARE_PROVIDER_SITE_OTHER): Payer: Medicare PPO

## 2019-09-30 VITALS — BP 118/69 | HR 74 | Temp 98.5°F | Resp 18 | Ht 64.0 in | Wt 333.0 lb

## 2019-09-30 DIAGNOSIS — M545 Low back pain, unspecified: Secondary | ICD-10-CM

## 2019-09-30 DIAGNOSIS — M5135 Other intervertebral disc degeneration, thoracolumbar region: Secondary | ICD-10-CM

## 2019-09-30 DIAGNOSIS — M546 Pain in thoracic spine: Secondary | ICD-10-CM | POA: Diagnosis not present

## 2019-09-30 DIAGNOSIS — G8929 Other chronic pain: Secondary | ICD-10-CM | POA: Diagnosis not present

## 2019-09-30 DIAGNOSIS — M5134 Other intervertebral disc degeneration, thoracic region: Secondary | ICD-10-CM | POA: Diagnosis not present

## 2019-09-30 MED ORDER — IBUPROFEN 600 MG PO TABS
600.0000 mg | ORAL_TABLET | Freq: Three times a day (TID) | ORAL | 0 refills | Status: DC | PRN
Start: 2019-09-30 — End: 2022-04-30

## 2019-09-30 MED ORDER — KETOROLAC TROMETHAMINE 60 MG/2ML IM SOLN
60.0000 mg | Freq: Once | INTRAMUSCULAR | Status: AC
  Administered 2019-09-30: 60 mg via INTRAMUSCULAR

## 2019-09-30 NOTE — Discharge Instructions (Addendum)
Use heating pad as needed If you have worsening symptoms, please return to the ED to be evaluated You will benefit from orthopedic surgeon or pain management specialist

## 2019-09-30 NOTE — ED Provider Notes (Signed)
[facility]    CSN: [id number] Arrival date & time: 09/30/19  0945      History   Chief Complaint Chief Complaint  Patient presents with  . Appointment  . Back Pain    HPI Mackenzie Bonilla is a 54 y.o. female who is morbidly obese comes to the urgent care with 2-week history of mid back pain with spasms.  Patient says that the pain is currently severe 10 out of 10.  Aggravated by walking or standing.  No known relieving factors.  Patient has tried several over-the-counter medications with no relief.  She denies any numbness or tingling in the lower extremities.  She denies any difficulty with urination.  No shortness of breath.  No trauma or recent falls.  No dizziness, near syncope or syncopal episodes.Marland Kitchen   HPI  Past Medical History:  Diagnosis Date  . Allergy    allergic rhinitis  . Anemia   . Anxiety   . Arthritis    osteoarthritis  . Asthma   . Claustrophobia    does not like oxygen mask covering face  . Depression   . Diabetes mellitus without complication ([place])   . Fall 2016  . Fatty liver 07/2008   abd. ultrasound - fatty liver ; slt dilated cbd (no stones ) 06/10// abd.  ultrasound normal on 04/2006  . Fibromyalgia   . GERD (gastroesophageal reflux disease)    EGD negative 06/2001// EGD erythematous mucosa, polyp 08/2008  . High uric acid in 24 hour urine specimen   . Hyperhidrosis   . Hyperlipidemia   . Kidney stones   . Lymphedema   . Obesity   . Shortness of breath dyspnea   . Tachycardia   . TMJ (dislocation of temporomandibular joint)   . UTI (lower urinary tract infection)     Patient Active Problem List   Diagnosis Date Noted  . Mid back pain 02/11/2019  . Multiple joint pain 11/12/2018  . UTI (urinary tract infection) 04/08/2018  . Eating disorder 05/23/2017  . Gallstones 04/19/2017  . Diabetic polyneuropathy associated with type 2 diabetes mellitus ([place]) 04/07/2017  . Diabetic angiopathy ([place]) 04/07/2017  . Vasomotor  symptoms due to menopause 04/07/2017  . Mobility impaired 11/12/2016  . Urinary incontinence 12/02/2015  . Candidal intertrigo 04/06/2015  . Cystocele 04/06/2015  . Constipation 04/06/2015  . Low back pain 12/28/2014  . Hypertriglyceridemia 10/14/2014  . Breast mass in female 07/28/2014  . Fall 07/23/2013  . Great toe pain 05/12/2013  . Medication management 11/20/2012  . Uric acid kidney stone 11/20/2011  . Hematuria 08/09/2010  . HYPERHIDROSIS 11/07/2009  . Menopausal symptoms 06/17/2009  . Type 2 diabetes mellitus without complication, without long-term current use of insulin ([place]) 08/23/2008  . VITAMIN D DEFICIENCY 05/07/2008  . Vitamin B 12 deficiency 06/04/2007  . CHRONIC FATIGUE SYNDROME 11/15/2006  . DIVERTICULITIS, HX OF 11/15/2006  . PCOS (polycystic ovarian syndrome) 10/16/2006  . Hyperlipidemia associated with type 2 diabetes mellitus ([place]) 10/16/2006  . Morbid obesity with BMI of 50.0-59.9, adult ([place]) 10/16/2006  . Generalized anxiety disorder 10/16/2006  . PANIC DISORDER 10/16/2006  . BULIMIA 10/16/2006  . Chronic depression 10/16/2006  . CARPAL TUNNEL SYNDROME 10/16/2006  . LYMPHEDEMA 10/16/2006  . Allergic rhinitis 10/16/2006  . Mild intermittent asthma 10/16/2006  . TMJ SYNDROME 10/16/2006  . GERD 10/16/2006  . IRRITABLE BOWEL SYNDROME 10/16/2006  . Osteoarthritis 10/16/2006  . COUGH, CHRONIC 10/16/2006    Past Surgical History:  Procedure Laterality Date  .  BREAST BIOPSY Right 2016   neg  . BREAST LUMPECTOMY WITH RADIOACTIVE SEED LOCALIZATION Right 08/02/2014   Procedure: BREAST LUMPECTOMY WITH RADIOACTIVE SEED LOCALIZATION;  Surgeon: Rolm Bookbinder, MD;  Location: [place];  Service: General;  Laterality: Right;  . COLONOSCOPY  08/12/2012   Completed by Dr. Phylis Bougie, normal exam.   . ENDOMETRIAL BIOPSY  01/2004  . ESOPHAGOGASTRODUODENOSCOPY    . ESOPHAGOGASTRODUODENOSCOPY (EGD) WITH PROPOFOL N/A 08/18/2019   Procedure: ESOPHAGOGASTRODUODENOSCOPY (EGD)  WITH PROPOFOL;  Surgeon: Lucilla Lame, MD;  Location: [facility];  Service: Endoscopy;  Laterality: N/A;  . FOOT SURGERY    . SEPTOPLASTY     two times  . TONSILLECTOMY    . UTERINE FIBROID SURGERY  06/2005   ablation  . uterine tumor  09/2001  . VAGUS NERVE STIMULATOR INSERTION      OB History    Gravida  0   Para  0   Term  0   Preterm  0   AB  0   Living  0     SAB  0   TAB  0   Ectopic  0   Multiple  0   Live Births  0        Obstetric Comments  1st Menstrual Cycle:  12            Home Medications    Prior to Admission medications   Medication Sig Start Date End Date Taking? Authorizing Provider  ACCU-CHEK AVIVA PLUS test strip Use to check blood sugar  once a day 05/30/15  Yes Elayne Snare, MD  ACCU-CHEK SOFTCLIX LANCETS lancets Use to check blood sugar  once a day 05/30/15  Yes Elayne Snare, MD  acetaminophen (TYLENOL) 500 MG tablet Take 500 mg by mouth every 6 (six) hours as needed.    Yes [provider]  albuterol (VENTOLIN HFA) 108 (90 Base) MCG/ACT inhaler Inhale 2 puffs into the lungs every 6 (six) hours as needed for wheezing. 02/11/19  Yes Tower, Wynelle Fanny, MD  ALPRAZolam Duanne Moron) 1 MG tablet Take 2 mg by mouth 3 (three) times daily as needed.    Yes [provider]  Blood Glucose Calibration (ACCU-CHEK AVIVA) SOLN  10/01/14  Yes [provider]  Blood Glucose Monitoring Suppl (FIFTY50 GLUCOSE METER 2.0) w/Device KIT Use as directed 07/02/18  Yes [provider]  Cyanocobalamin 1000 MCG/ML KIT Inject 1,000 mcg as directed once a week. For 4 weeks, then inject once monthly, please administer at the pharmacy 11/13/18  Yes Tower, Wynelle Fanny, MD  cyclobenzaprine (FLEXERIL) 5 MG tablet TAKE ONE TABLET 3 TIMES DAILY AS NEEDED FOR MUSCLE SPASM 06/24/19  Yes Tower, Wynelle Fanny, MD  desvenlafaxine (PRISTIQ) 100 MG 24 hr tablet  03/12/18  Yes [provider]  dimenhyDRINATE (DRAMAMINE) 50 MG tablet Take 50 mg by mouth every  8 (eight) hours as needed.   Yes [provider]  gabapentin (NEURONTIN) 100 MG capsule Take 1 capsule (100 mg total) by mouth 2 (two) times daily. 01/23/19  Yes Edrick Kins, DPM  gabapentin (NEURONTIN) 300 MG capsule Take 1 capsule (300 mg total) by mouth at bedtime. 01/23/19  Yes Edrick Kins, DPM  glipiZIDE (GLUCOTROL XL) 10 MG 24 hr tablet Take 1 tablet by mouth daily. 02/03/19  Yes [provider]  insulin glargine (LANTUS SOLOSTAR) 100 UNIT/ML Solostar Pen 32 Units at bedtime.  08/10/19  Yes [provider]  ketoconazole (NIZORAL) 2 % cream Apply 1 application topically daily as  needed for irritation. For yeast rash to affected area 07/01/19  Yes Tower, Wynelle Fanny, MD  loratadine (CLARITIN) 10 MG tablet Take by mouth.   Yes [provider]  losartan (COZAAR) 25 MG tablet  05/23/17  Yes [provider]  nystatin (MYCOSTATIN/NYSTOP) powder Apply topically 3 (three) times daily. To affected areas of yeast rash 10/13/18  Yes Tower, Wynelle Fanny, MD  nystatin cream (MYCOSTATIN) Apply 1 application topically 3 (three) times daily as needed (for yeast infection areas). To affected areas 03/17/19  Yes Tower, Wynelle Fanny, MD  omeprazole (PRILOSEC) 20 MG capsule TAKE 1 CAPSULE BY MOUTH TWICE DAILY 07/31/19  Yes Tower, Marne A, MD  oxybutynin (DITROPAN XL) 15 MG 24 hr tablet TAKE ONE TABLET BY MOUTH EVERY DAY 08/24/19  Yes Tower, Wynelle Fanny, MD  Polyethyl Glycol-Propyl Glycol (SYSTANE OP) Apply 1 drop to eye daily as needed.   Yes [provider]  pravastatin (PRAVACHOL) 20 MG tablet Take 1 tablet (20 mg total) by mouth at bedtime. 02/11/19  Yes Tower, Wynelle Fanny, MD  Simethicone (GAS-X PO) Take by mouth.   Yes [provider]  spironolactone (ALDACTONE) 100 MG tablet TAKE ONE TABLET EVERY DAY 09/18/19  Yes Tower, Wynelle Fanny, MD  traZODone (DESYREL) 150 MG tablet Take 300 mg by mouth at bedtime.  05/20/14  Yes [provider]  triamcinolone cream (KENALOG) 0.1  %  09/30/18  Yes [provider]  TRULICITY 3 DJ/2.4QA SOPN INJECT 3MG (0.5ML) SUBQ EVERY 7 DAY 02/03/19  Yes [provider]  ibuprofen (ADVIL) 600 MG tablet Take 1 tablet (600 mg total) by mouth every 8 (eight) hours as needed. 09/30/19   Brandin Dilday, Myrene Galas, MD  traMADol Veatrice Bourbon) 50 MG tablet TAKE ONE TABLET TWICE A DAY IF NEEDED FOR SEVERE PAIN 09/30/19   Tower, Wynelle Fanny, MD    Family History Family History  Problem Relation Age of Onset  . Hypertension Father   . Cancer Father        pancreatic cancer  . Hypertension Sister   . Heart disease Maternal Grandmother 60       MI  . Diabetes Maternal Grandmother   . Heart disease Paternal Grandmother 58       MI  . Diabetes Paternal Grandmother   . Breast cancer Neg Hx     Social History Social History   Tobacco Use  . Smoking status: Never Smoker  . Smokeless tobacco: Never Used  Vaping Use  . Vaping Use: Never used  Substance Use Topics  . Alcohol use: No    Alcohol/week: 0.0 standard drinks  . Drug use: No     Allergies   Cephalexin, Codeine, Duloxetine, Levaquin [levofloxacin], Lisinopril, Metformin and related, Prednisone, Quetiapine, Sertraline hcl, Simvastatin, Triazolam, Zaleplon, Zocor [simvastatin - high dose], Zolpidem tartrate, Hydrocodone, and Norelgestromin-eth estradiol   Review of Systems Review of Systems  Constitutional: Negative.   Respiratory: Negative.   Gastrointestinal: Negative.   Genitourinary: Negative.  Negative for difficulty urinating.  Musculoskeletal: Positive for arthralgias and back pain. Negative for joint swelling and myalgias.  Neurological: Positive for numbness. Negative for dizziness, weakness and headaches.  Psychiatric/Behavioral: Negative for confusion and decreased concentration.     Physical Exam Triage Vital Signs ED Triage Vitals  Enc Vitals Group     BP 09/30/19 1041 118/69     Pulse Rate 09/30/19 1041 74     Resp 09/30/19 1041 18     Temp 09/30/19  1041 98.5 F (36.9 C)  Temp Source 09/30/19 1041 Oral     SpO2 09/30/19 1041 98 %     Weight 09/30/19 1036 (!) 333 lb (151 kg)     Height 09/30/19 1036 5' 4"  (1.626 m)     Head Circumference --      Peak Flow --      Pain Score 09/30/19 1035 10     Pain Loc --      Pain Edu? --      Excl. in [place]? --    No data found.  Updated Vital Signs BP 118/69 (BP Location: Right Arm)   Pulse 74   Temp 98.5 F (36.9 C) (Oral)   Resp 18   Ht 5' 4"  (1.626 m)   Wt (!) 151 kg   SpO2 98%   BMI 57.16 kg/m   Visual Acuity Right Eye Distance:   Left Eye Distance:   Bilateral Distance:    Right Eye Near:   Left Eye Near:    Bilateral Near:     Physical Exam Vitals and nursing note reviewed.  Constitutional:      General: She is in acute distress.     Appearance: She is not ill-appearing.  Cardiovascular:     Rate and Rhythm: Normal rate and regular rhythm.  Pulmonary:     Effort: Pulmonary effort is normal.     Breath sounds: Normal breath sounds.  Abdominal:     General: Bowel sounds are normal.     Palpations: Abdomen is soft.  Musculoskeletal:     Comments: Range of motion of the thoracic vertebral spine is limited by pain.  Skin:    General: Skin is warm.     Capillary Refill: Capillary refill takes less than 2 seconds.  Neurological:     General: No focal deficit present.     Mental Status: She is alert and oriented to person, place, and time.     Cranial Nerves: No cranial nerve deficit.     Sensory: No sensory deficit.     Motor: No weakness.     Coordination: Coordination normal.     Deep Tendon Reflexes: Reflexes normal.      UC Treatments / Results  Labs (all labs ordered are listed, but only abnormal results are displayed) Labs Reviewed - No data to display  EKG   Radiology DG Thoracic Spine 2 View  Result Date: 09/30/2019 CLINICAL DATA:  Dorsalgia EXAM: THORACIC SPINE 3 VIEWS COMPARISON:  None. FINDINGS: Frontal, lateral, and swimmer's views were  obtained. There is midthoracic dextroscoliosis. There is no fracture or spondylolisthesis. There is mild disc space narrowing at several levels. No erosive change or paraspinous lesions. There are anterior and right lateral osteophytes at several levels. No erosive change or paraspinous lesion. Visualized lungs clear. IMPRESSION: Scoliosis. Disc space narrowing and osteophyte formation at several levels. No fracture or spondylolisthesis. Electronically Signed   By: Lowella Grip III M.D.   On: 09/30/2019 11:35    Procedures Procedures (including critical care time)  Medications Ordered in UC Medications  ketorolac (TORADOL) injection 60 mg (60 mg Intramuscular Given 09/30/19 1115)    Initial Impression / Assessment and Plan / UC Course  I have reviewed the triage vital signs and the nursing notes.  Pertinent labs & imaging results that were available during my care of the patient were reviewed by me and considered in my medical decision making (see chart for details).     1.  Chronic low back pain without sciatica: X-ray of  the thoracic spine shows multi level disc narrowing and degenerative disc disease.  No fracture or spondylolisthesis Toradol 60 mg IM x1 dose Ibuprofen 600 mg every 6 hours as needed for pain Patient completed a course of steroids today Heating pad as needed Patient will need further evaluation by spine surgeon orthopedic surgery Patient has expressed that she does not want to have surgery done.  I explained that in back is she will benefit from seeing a pain specialist. Return precautions given Final Clinical Impressions(s) / UC Diagnoses   Final diagnoses:  Chronic midline low back pain, unspecified whether sciatica present  DDD (degenerative disc disease), thoracolumbar     Discharge Instructions     Use heating pad as needed If you have worsening symptoms, please return to the ED to be evaluated You will benefit from orthopedic surgeon or pain  management specialist   ED Prescriptions    Medication Sig Dispense Auth. Provider   ibuprofen (ADVIL) 600 MG tablet Take 1 tablet (600 mg total) by mouth every 8 (eight) hours as needed. 30 tablet Breia Ocampo, Myrene Galas, MD     I have reviewed the PDMP during this encounter.   Chase Picket, MD 09/30/19 [phone]

## 2019-09-30 NOTE — Telephone Encounter (Signed)
Name of Medication: Tramadol Name of Pharmacy: [facility] or Written Date and Quantity: 02/11/20 #30 tabs with 0 refills  Last Office Visit and Type: Back Pain on 02/11/19 Next Office Visit and Type: no future

## 2019-09-30 NOTE — ED Triage Notes (Signed)
Patient c/o back spasms that started 2 weeks ago. Patient states the pain is getting progressively worse. She has tried multiple OTC medications with no relief.

## 2019-10-01 ENCOUNTER — Other Ambulatory Visit: Payer: Self-pay | Admitting: Physical Medicine & Rehabilitation

## 2019-10-01 DIAGNOSIS — M5414 Radiculopathy, thoracic region: Secondary | ICD-10-CM

## 2019-10-01 DIAGNOSIS — M546 Pain in thoracic spine: Secondary | ICD-10-CM

## 2019-10-05 DIAGNOSIS — M9903 Segmental and somatic dysfunction of lumbar region: Secondary | ICD-10-CM | POA: Diagnosis not present

## 2019-10-05 DIAGNOSIS — M9904 Segmental and somatic dysfunction of sacral region: Secondary | ICD-10-CM | POA: Diagnosis not present

## 2019-10-05 DIAGNOSIS — M9905 Segmental and somatic dysfunction of pelvic region: Secondary | ICD-10-CM | POA: Diagnosis not present

## 2019-10-05 DIAGNOSIS — M5431 Sciatica, right side: Secondary | ICD-10-CM | POA: Diagnosis not present

## 2019-10-06 ENCOUNTER — Other Ambulatory Visit: Payer: Self-pay

## 2019-10-06 ENCOUNTER — Ambulatory Visit: Payer: Medicare PPO | Admitting: Family Medicine

## 2019-10-06 ENCOUNTER — Encounter: Payer: Self-pay | Admitting: Family Medicine

## 2019-10-06 VITALS — BP 128/68 | HR 78 | Temp 97.0°F | Ht 64.0 in | Wt 331.1 lb

## 2019-10-06 DIAGNOSIS — E119 Type 2 diabetes mellitus without complications: Secondary | ICD-10-CM

## 2019-10-06 DIAGNOSIS — Z7189 Other specified counseling: Secondary | ICD-10-CM | POA: Diagnosis not present

## 2019-10-06 DIAGNOSIS — M549 Dorsalgia, unspecified: Secondary | ICD-10-CM

## 2019-10-06 DIAGNOSIS — M545 Low back pain, unspecified: Secondary | ICD-10-CM

## 2019-10-06 DIAGNOSIS — G8929 Other chronic pain: Secondary | ICD-10-CM | POA: Diagnosis not present

## 2019-10-06 NOTE — Progress Notes (Signed)
Subjective:    Patient ID: Mackenzie Bonilla, female    DOB: 03-28-1965, 54 y.o.   MRN: [id number]  This visit occurred during the SARS-CoV-2 public health emergency.  Safety protocols were in place, including screening questions prior to the visit, additional usage of staff PPE, and extensive cleaning of exam room while observing appropriate contact time as indicated for disinfecting solutions.    HPI Pt presents with back pain   Wt Readings from Last 3 Encounters:  10/06/19 (!) 331 lb 1 oz (150.2 kg)  09/30/19 (!) 333 lb (151 kg)  08/18/19 (!) 332 lb 14.3 oz (151 kg)   56.83 kg/m  Pain is worse now right in the middle of her back    ENT put her on prednisone for hearing loss in ear  Then she had low blood sugar  That is when twinges started in back and it became more severe (incredible spasms)   Cried all day Could not move due to pain  Sits in a recliner to get comfortable (firm support feels better for her)  Changing position worsens it  Pain has now moved to her R side (she thinks from spasm)  Right behind bra strap on that side   Last xray of TS- recent in [place] No fractures  Some deg disc and bone changes ?  Was given anti inflam shot- only worked for several hours   Has not done PT -is afraid of it    Has been to ortho and UC  Disc pain management ref -will take a while to do  She has allergies to prednisone and opiods and nsaids cause constipation (although she did take ibuprofen recently) Takes tylenol Also tramadol for severe pain    Takes flexeril and gabapentin  CT myelogram was ordered to start   Did not like the practice  Dr Patsi Sears Dr Alba Destine  Did not like lack or organization   Patient Active Problem List   Diagnosis Date Noted  . Encounter for medication review and counseling 10/06/2019  . Mid back pain 02/11/2019  . Multiple joint pain 11/12/2018  . UTI (urinary tract infection) 04/08/2018  . Eating disorder 05/23/2017  . Gallstones  04/19/2017  . Diabetic polyneuropathy associated with type 2 diabetes mellitus ([place]) 04/07/2017  . Diabetic angiopathy ([place]) 04/07/2017  . Vasomotor symptoms due to menopause 04/07/2017  . Mobility impaired 11/12/2016  . Urinary incontinence 12/02/2015  . Candidal intertrigo 04/06/2015  . Cystocele 04/06/2015  . Constipation 04/06/2015  . Low back pain 12/28/2014  . Hypertriglyceridemia 10/14/2014  . Breast mass in female 07/28/2014  . Fall 07/23/2013  . Great toe pain 05/12/2013  . Medication management 11/20/2012  . Uric acid kidney stone 11/20/2011  . Hematuria 08/09/2010  . HYPERHIDROSIS 11/07/2009  . Menopausal symptoms 06/17/2009  . Type 2 diabetes mellitus without complication, without long-term current use of insulin ([place]) 08/23/2008  . VITAMIN D DEFICIENCY 05/07/2008  . Vitamin B 12 deficiency 06/04/2007  . CHRONIC FATIGUE SYNDROME 11/15/2006  . DIVERTICULITIS, HX OF 11/15/2006  . PCOS (polycystic ovarian syndrome) 10/16/2006  . Hyperlipidemia associated with type 2 diabetes mellitus ([place]) 10/16/2006  . Morbid obesity with BMI of 50.0-59.9, adult ([place]) 10/16/2006  . Generalized anxiety disorder 10/16/2006  . PANIC DISORDER 10/16/2006  . BULIMIA 10/16/2006  . Chronic depression 10/16/2006  . CARPAL TUNNEL SYNDROME 10/16/2006  . LYMPHEDEMA 10/16/2006  . Allergic rhinitis 10/16/2006  . Mild intermittent asthma 10/16/2006  . TMJ SYNDROME 10/16/2006  . GERD 10/16/2006  .  IRRITABLE BOWEL SYNDROME 10/16/2006  . Osteoarthritis 10/16/2006  . COUGH, CHRONIC 10/16/2006   Past Medical History:  Diagnosis Date  . Allergy    allergic rhinitis  . Anemia   . Anxiety   . Arthritis    osteoarthritis  . Asthma   . Claustrophobia    does not like oxygen mask covering face  . Depression   . Diabetes mellitus without complication ([place])   . Fall 2016  . Fatty liver 07/2008   abd. ultrasound - fatty liver ; slt dilated cbd (no stones ) 06/10// abd.  ultrasound normal on  04/2006  . Fibromyalgia   . GERD (gastroesophageal reflux disease)    EGD negative 06/2001// EGD erythematous mucosa, polyp 08/2008  . High uric acid in 24 hour urine specimen   . Hyperhidrosis   . Hyperlipidemia   . Kidney stones   . Lymphedema   . Obesity   . Shortness of breath dyspnea   . Tachycardia   . TMJ (dislocation of temporomandibular joint)   . UTI (lower urinary tract infection)    Past Surgical History:  Procedure Laterality Date  . BREAST BIOPSY Right 2016   neg  . BREAST LUMPECTOMY WITH RADIOACTIVE SEED LOCALIZATION Right 08/02/2014   Procedure: BREAST LUMPECTOMY WITH RADIOACTIVE SEED LOCALIZATION;  Surgeon: Rolm Bookbinder, MD;  Location: [facility];  Service: General;  Laterality: Right;  . COLONOSCOPY  08/12/2012   Completed by Dr. Phylis Bougie, normal exam.   . ENDOMETRIAL BIOPSY  01/2004  . ESOPHAGOGASTRODUODENOSCOPY    . ESOPHAGOGASTRODUODENOSCOPY (EGD) WITH PROPOFOL N/A 08/18/2019   Procedure: ESOPHAGOGASTRODUODENOSCOPY (EGD) WITH PROPOFOL;  Surgeon: Lucilla Lame, MD;  Location: [facility] ENDOSCOPY;  Service: Endoscopy;  Laterality: N/A;  . FOOT SURGERY    . SEPTOPLASTY     two times  . TONSILLECTOMY    . UTERINE FIBROID SURGERY  06/2005   ablation  . uterine tumor  09/2001  . VAGUS NERVE STIMULATOR INSERTION     Social History   Tobacco Use  . Smoking status: Never Smoker  . Smokeless tobacco: Never Used  Vaping Use  . Vaping Use: Never used  Substance Use Topics  . Alcohol use: No    Alcohol/week: 0.0 standard drinks  . Drug use: No   Family History  Problem Relation Age of Onset  . Hypertension Father   . Cancer Father        pancreatic cancer  . Hypertension Sister   . Heart disease Maternal Grandmother 60       MI  . Diabetes Maternal Grandmother   . Heart disease Paternal Grandmother 59       MI  . Diabetes Paternal Grandmother   . Breast cancer Neg Hx    Allergies  Allergen Reactions  . Cephalexin   . Codeine     rash  . Duloxetine   .  Levaquin [Levofloxacin] Nausea Only  . Lisinopril Cough  . Metformin And Related Diarrhea    GI side effects   . Quetiapine Other (See Comments)  . Sertraline Hcl     REACTION: vivid dreams  . Simvastatin Other (See Comments)    achey  . Triazolam     REACTION: ? reaction  . Zaleplon   . Zocor [Simvastatin - High Dose]     achey  . Zolpidem Tartrate     hallucinations  . Hydrocodone Rash    REACTION: ? reaction REACTION: ? reaction  . Norelgestromin-Eth Estradiol     Patch didn't stick to skin  Current Outpatient Medications on File Prior to Visit  Medication Sig Dispense Refill  . ACCU-CHEK AVIVA PLUS test strip Use to check blood sugar  once a day 100 each 0  . ACCU-CHEK SOFTCLIX LANCETS lancets Use to check blood sugar  once a day 100 each 0  . acetaminophen (TYLENOL) 500 MG tablet Take 500 mg by mouth every 6 (six) hours as needed.     Marland Kitchen albuterol (VENTOLIN HFA) 108 (90 Base) MCG/ACT inhaler Inhale 2 puffs into the lungs every 6 (six) hours as needed for wheezing. 18 g 3  . ALPRAZolam (XANAX) 1 MG tablet Take 2 mg by mouth 3 (three) times daily as needed.     . Blood Glucose Calibration (ACCU-CHEK AVIVA) SOLN     . Blood Glucose Monitoring Suppl (FIFTY50 GLUCOSE METER 2.0) w/Device KIT Use as directed    . Cyanocobalamin 1000 MCG/ML KIT Inject 1,000 mcg as directed once a week. For 4 weeks, then inject once monthly, please administer at the pharmacy 1 kit 11  . cyclobenzaprine (FLEXERIL) 5 MG tablet TAKE ONE TABLET 3 TIMES DAILY AS NEEDED FOR MUSCLE SPASM 30 tablet 3  . desvenlafaxine (PRISTIQ) 100 MG 24 hr tablet     . dimenhyDRINATE (DRAMAMINE) 50 MG tablet Take 50 mg by mouth every 8 (eight) hours as needed.    . gabapentin (NEURONTIN) 100 MG capsule Take 1 capsule (100 mg total) by mouth 2 (two) times daily. 360 capsule 3  . gabapentin (NEURONTIN) 300 MG capsule Take 1 capsule (300 mg total) by mouth at bedtime. 90 capsule 3  . glipiZIDE (GLUCOTROL XL) 10 MG 24 hr tablet  Take 1 tablet by mouth daily.    Marland Kitchen ibuprofen (ADVIL) 600 MG tablet Take 1 tablet (600 mg total) by mouth every 8 (eight) hours as needed. 30 tablet 0  . insulin glargine (LANTUS SOLOSTAR) 100 UNIT/ML Solostar Pen 32 Units at bedtime.     Marland Kitchen losartan (COZAAR) 25 MG tablet     . nystatin cream (MYCOSTATIN) Apply 1 application topically 3 (three) times daily as needed (for yeast infection areas). To affected areas 30 g 3  . omeprazole (PRILOSEC) 20 MG capsule TAKE 1 CAPSULE BY MOUTH TWICE DAILY 180 capsule 0  . oxybutynin (DITROPAN XL) 15 MG 24 hr tablet TAKE ONE TABLET BY MOUTH EVERY DAY 90 tablet 0  . Polyethyl Glycol-Propyl Glycol (SYSTANE OP) Apply 1 drop to eye daily as needed.    . pravastatin (PRAVACHOL) 20 MG tablet Take 1 tablet (20 mg total) by mouth at bedtime. 30 tablet 11  . Simethicone (GAS-X PO) Take by mouth.    . spironolactone (ALDACTONE) 100 MG tablet TAKE ONE TABLET EVERY DAY 30 tablet 2  . traMADol (ULTRAM) 50 MG tablet TAKE ONE TABLET TWICE A DAY IF NEEDED FOR SEVERE PAIN 30 tablet 0  . traZODone (DESYREL) 150 MG tablet Take 300 mg by mouth at bedtime.     . TRULICITY 3 SN/0.5LZ SOPN INJECT 3MG (0.5ML) SUBQ EVERY 7 DAY     No current facility-administered medications on file prior to visit.     Review of Systems  Constitutional: Negative for activity change, appetite change, fatigue, fever and unexpected weight change.  HENT: Positive for hearing loss. Negative for congestion, ear pain, rhinorrhea, sinus pressure and sore throat.   Eyes: Negative for pain, redness and visual disturbance.  Respiratory: Negative for cough, shortness of breath and wheezing.   Cardiovascular: Negative for chest pain and palpitations.  Gastrointestinal: Negative for  abdominal pain, blood in stool, constipation and diarrhea.  Endocrine: Negative for polydipsia and polyuria.  Genitourinary: Negative for dysuria, frequency and urgency.  Musculoskeletal: Positive for arthralgias, back pain, gait  problem and myalgias. Negative for joint swelling.  Skin: Negative for pallor and rash.  Allergic/Immunologic: Negative for environmental allergies.  Neurological: Negative for dizziness, syncope and headaches.  Hematological: Negative for adenopathy. Does not bruise/bleed easily.  Psychiatric/Behavioral: Positive for dysphoric mood. Negative for decreased concentration. The patient is nervous/anxious.        Objective:   Physical Exam Constitutional:      General: She is not in acute distress.    Appearance: Normal appearance. She is obese. She is not ill-appearing or diaphoretic.  Eyes:     General: No scleral icterus.    Conjunctiva/sclera: Conjunctivae normal.     Pupils: Pupils are equal, round, and reactive to light.  Cardiovascular:     Rate and Rhythm: Normal rate and regular rhythm.  Pulmonary:     Effort: Pulmonary effort is normal. No respiratory distress.  Skin:    General: Skin is warm and dry.     Coloration: Skin is not pale.     Findings: No erythema.  Neurological:     Mental Status: She is alert.     Comments: Gait is labored due to back pain and obesity  Psychiatric:        Mood and Affect: Mood is anxious.        Speech: Speech is tangential.        Cognition and Memory: Cognition and memory normal.     Comments: Voices frustration over her medical problems            Assessment & Plan:   Problem List Items Addressed This Visit      Endocrine   Type 2 diabetes mellitus without complication, without long-term current use of insulin ([place])    Continues endocrinology care  Did note decreased glucose readings on a low sodium diet recently  I think that was because she ate lower carb foods/less processed        Other   Low back pain   Relevant Orders   Ambulatory referral to Physical Therapy   Mid back pain - Primary    Pt has seen orthopedics and consideration was given to CT myelogram (with option of epidural injection) With her weight and  also multiple drug intolerances -apprehensive Disc opt for tx (since medications do not help thus far incl tylenol, tramadol, flexeril, gabapentin and occ nsaid)  We reviewed her current med and med intol list today Has dx in mid and low back of deg disc and joint dz  Morbid obesity may add  She has not done PT (I think this is an important next step)- referral done If no improvement-would like to be ref to new ortho practice  She was ref to a pain management clinic-I think she should proceed with this        Relevant Orders   Ambulatory referral to Physical Therapy   Encounter for medication review and counseling    Took time today to review current medicine list  Also her intolerances list  Took prednisone off- not an allergy or intol  Hydrocodone and codeine cause rash- labeled as allergies  Some of her psych meds (intol) we do not have rxn info about  Pt was given copy of all in her avs today  This did clarify things

## 2019-10-06 NOTE — Patient Instructions (Addendum)
I sent a referral for physical therapy  Our office will call you    I took prednisone off your medicine intolerance list   Stay on course for pain management

## 2019-10-06 NOTE — Assessment & Plan Note (Signed)
Took time today to review current medicine list  Also her intolerances list  Took prednisone off- not an allergy or intol  Hydrocodone and codeine cause rash- labeled as allergies  Some of her psych meds (intol) we do not have rxn info about  Pt was given copy of all in her avs today  This did clarify things

## 2019-10-06 NOTE — Assessment & Plan Note (Addendum)
Pt has seen orthopedics and consideration was given to CT myelogram (with option of epidural injection) With her weight and also multiple drug intolerances -apprehensive Disc opt for tx (since medications do not help thus far incl tylenol, tramadol, flexeril, gabapentin and occ nsaid)  We reviewed her current med and med intol list today Has dx in mid and low back of deg disc and joint dz  Morbid obesity may add  She has not done PT (I think this is an important next step)- referral done If no improvement-would like to be ref to new ortho practice  She was ref to a pain management clinic-I think she should proceed with this

## 2019-10-06 NOTE — Assessment & Plan Note (Signed)
Continues endocrinology care  Did note decreased glucose readings on a low sodium diet recently  I think that was because she ate lower carb foods/less processed

## 2019-10-07 DIAGNOSIS — H9120 Sudden idiopathic hearing loss, unspecified ear: Secondary | ICD-10-CM | POA: Diagnosis not present

## 2019-10-07 DIAGNOSIS — H93292 Other abnormal auditory perceptions, left ear: Secondary | ICD-10-CM | POA: Diagnosis not present

## 2019-10-16 DIAGNOSIS — M545 Low back pain: Secondary | ICD-10-CM | POA: Diagnosis not present

## 2019-10-22 DIAGNOSIS — F332 Major depressive disorder, recurrent severe without psychotic features: Secondary | ICD-10-CM | POA: Diagnosis not present

## 2019-10-22 DIAGNOSIS — F4312 Post-traumatic stress disorder, chronic: Secondary | ICD-10-CM | POA: Diagnosis not present

## 2019-10-22 DIAGNOSIS — F411 Generalized anxiety disorder: Secondary | ICD-10-CM | POA: Diagnosis not present

## 2019-10-22 DIAGNOSIS — F410 Panic disorder [episodic paroxysmal anxiety] without agoraphobia: Secondary | ICD-10-CM | POA: Diagnosis not present

## 2019-10-24 DIAGNOSIS — F411 Generalized anxiety disorder: Secondary | ICD-10-CM | POA: Diagnosis not present

## 2019-10-24 DIAGNOSIS — F332 Major depressive disorder, recurrent severe without psychotic features: Secondary | ICD-10-CM | POA: Diagnosis not present

## 2019-10-24 DIAGNOSIS — F410 Panic disorder [episodic paroxysmal anxiety] without agoraphobia: Secondary | ICD-10-CM | POA: Diagnosis not present

## 2019-10-24 DIAGNOSIS — F4312 Post-traumatic stress disorder, chronic: Secondary | ICD-10-CM | POA: Diagnosis not present

## 2019-10-26 ENCOUNTER — Other Ambulatory Visit: Payer: Self-pay | Admitting: Family Medicine

## 2019-10-26 DIAGNOSIS — M545 Low back pain: Secondary | ICD-10-CM | POA: Diagnosis not present

## 2019-10-28 DIAGNOSIS — M5431 Sciatica, right side: Secondary | ICD-10-CM | POA: Diagnosis not present

## 2019-10-28 DIAGNOSIS — M9904 Segmental and somatic dysfunction of sacral region: Secondary | ICD-10-CM | POA: Diagnosis not present

## 2019-10-28 DIAGNOSIS — M9903 Segmental and somatic dysfunction of lumbar region: Secondary | ICD-10-CM | POA: Diagnosis not present

## 2019-10-28 DIAGNOSIS — M545 Low back pain: Secondary | ICD-10-CM | POA: Diagnosis not present

## 2019-10-28 DIAGNOSIS — M9905 Segmental and somatic dysfunction of pelvic region: Secondary | ICD-10-CM | POA: Diagnosis not present

## 2019-10-29 DIAGNOSIS — H02822 Cysts of right lower eyelid: Secondary | ICD-10-CM | POA: Diagnosis not present

## 2019-11-03 DIAGNOSIS — F411 Generalized anxiety disorder: Secondary | ICD-10-CM | POA: Diagnosis not present

## 2019-11-03 DIAGNOSIS — F4312 Post-traumatic stress disorder, chronic: Secondary | ICD-10-CM | POA: Diagnosis not present

## 2019-11-03 DIAGNOSIS — F332 Major depressive disorder, recurrent severe without psychotic features: Secondary | ICD-10-CM | POA: Diagnosis not present

## 2019-11-03 DIAGNOSIS — F410 Panic disorder [episodic paroxysmal anxiety] without agoraphobia: Secondary | ICD-10-CM | POA: Diagnosis not present

## 2019-11-03 NOTE — Progress Notes (Deleted)
Pt present due to having a rash on vaginal area that hurts to touch the area.

## 2019-11-04 ENCOUNTER — Encounter: Payer: Medicare PPO | Admitting: Obstetrics and Gynecology

## 2019-11-04 ENCOUNTER — Ambulatory Visit: Payer: Medicare PPO | Admitting: Obstetrics and Gynecology

## 2019-11-04 ENCOUNTER — Encounter: Payer: Self-pay | Admitting: Obstetrics and Gynecology

## 2019-11-04 ENCOUNTER — Other Ambulatory Visit: Payer: Self-pay

## 2019-11-04 VITALS — BP 104/69 | HR 91 | Ht 64.0 in | Wt 331.0 lb

## 2019-11-04 DIAGNOSIS — N764 Abscess of vulva: Secondary | ICD-10-CM

## 2019-11-04 NOTE — Progress Notes (Signed)
    GYNECOLOGY PROGRESS NOTE  Subjective:    Patient ID: Mackenzie Bonilla, female    DOB: April 22, 1965, 54 y.o.   MRN: [id number]  HPI  Patient is a 54 y.o. G0P0000 female who presents for complaints of a rash on her vaginal area (right side).  Notes that it started a few weeks ago. Also noting that there was a bump in that area. Wonders if the elastic from her underwear are causing her symptoms. Noted that the area has had some bleeding.   The following portions of the patient's history were reviewed and updated as appropriate: allergies, current medications, past family history, past medical history, past social history, past surgical history and problem list.  Review of Systems Pertinent items noted in HPI and remainder of comprehensive ROS otherwise negative.   Objective:   Blood pressure 104/69, pulse 91, height 5\' 4"  (1.626 m), weight (!) 331 lb (150.1 kg). General appearance: alert and no distress, morbidly obese Abdomen: soft, non-tender; bowel sounds normal; no masses,  no organomegaly Pelvic: external genitalia with healing abscess site noted on right vulva. Rectovaginal septum normal.  Internal exam not performed.   Assessment:   Vulvar abscess (healing)  Plan:   - Advised on home treatment measures.  Area does not appear infected, does appear slightly bruised. Discussed ensuring use of cotton pants, underwear, can go without underwear at times. Notes she has physical therapy for her back and sometimes feels the extra movements cause friction. Asks if she can skip next therapy session Friday to help treat her symptoms. Ok to do. To f/u if symptoms worsen or fail to improve.    Rubie Maid, MD [facility]

## 2019-11-04 NOTE — Patient Instructions (Signed)

## 2019-11-04 NOTE — Progress Notes (Signed)
Pt present due to having a rash on the vaginal area. Pt stated that she noticed the rash about a couple of weeks ago and the area also has been bleeding.

## 2019-11-06 DIAGNOSIS — D2339 Other benign neoplasm of skin of other parts of face: Secondary | ICD-10-CM | POA: Diagnosis not present

## 2019-11-10 ENCOUNTER — Other Ambulatory Visit: Payer: Self-pay

## 2019-11-10 ENCOUNTER — Ambulatory Visit: Payer: Medicare PPO | Admitting: Podiatry

## 2019-11-10 ENCOUNTER — Telehealth: Payer: Self-pay | Admitting: Family Medicine

## 2019-11-10 DIAGNOSIS — M79676 Pain in unspecified toe(s): Secondary | ICD-10-CM | POA: Diagnosis not present

## 2019-11-10 DIAGNOSIS — L989 Disorder of the skin and subcutaneous tissue, unspecified: Secondary | ICD-10-CM | POA: Diagnosis not present

## 2019-11-10 DIAGNOSIS — B351 Tinea unguium: Secondary | ICD-10-CM

## 2019-11-10 DIAGNOSIS — E0843 Diabetes mellitus due to underlying condition with diabetic autonomic (poly)neuropathy: Secondary | ICD-10-CM | POA: Diagnosis not present

## 2019-11-10 DIAGNOSIS — I890 Lymphedema, not elsewhere classified: Secondary | ICD-10-CM

## 2019-11-10 NOTE — Progress Notes (Signed)
   SUBJECTIVE Patient presents to office today complaining of elongated, thickened nails that cause pain while ambulating in shoes.  She is unable to trim her own nails.  Patient states that she tried OTC power step insoles that were provided last visit however they were uncomfortable and do not fit in her shoes.  Patient is here for further evaluation and treatment.  Past Medical History:  Diagnosis Date  . Allergy    allergic rhinitis  . Anemia   . Anxiety   . Arthritis    osteoarthritis  . Asthma   . Claustrophobia    does not like oxygen mask covering face  . Depression   . Diabetes mellitus without complication ([place])   . Fall 2016  . Fatty liver 07/2008   abd. ultrasound - fatty liver ; slt dilated cbd (no stones ) 06/10// abd.  ultrasound normal on 04/2006  . Fibromyalgia   . GERD (gastroesophageal reflux disease)    EGD negative 06/2001// EGD erythematous mucosa, polyp 08/2008  . High uric acid in 24 hour urine specimen   . Hyperhidrosis   . Hyperlipidemia   . Kidney stones   . Lymphedema   . Obesity   . PCOS (polycystic ovarian syndrome)   . Shortness of breath dyspnea   . Tachycardia   . TMJ (dislocation of temporomandibular joint)   . UTI (lower urinary tract infection)     OBJECTIVE General Patient is awake, alert, and oriented x 3 and in no acute distress. Derm Skin is dry and supple bilateral. Negative open lesions or macerations. Remaining integument unremarkable. Nails are tender, long, thickened and dystrophic with subungual debris, consistent with onychomycosis, 1-5 bilateral. No signs of infection noted.  Hyperkeratotic preulcerative callus tissue noted to the bilateral feet and toes Vasc  DP and PT pedal pulses palpable bilaterally. Temperature gradient within normal limits.  Edema noted bilateral lower extremities Neuro Epicritic and protective threshold sensation grossly intact bilaterally.  Musculoskeletal Exam No symptomatic pedal deformities noted  bilateral. Muscular strength within normal limits.  ASSESSMENT 1.  Pain due to onychomycosis of toenails bilateral 2.  Preulcerative calluses bilateral 3.  Bilateral lower extremity edema  PLAN OF CARE 1. Patient evaluated today.  2. Instructed to maintain good pedal hygiene and foot care.  3. Mechanical debridement of nails 1-5 bilaterally performed using a nail nipper. Filed with dremel without incident.  4.  Excisional debridement of the preulcerative callus lesions was performed using a tissue nipper without incident or bleeding  5.  Recommend accommodative walking shoes from [employer]  6.  Return to clinic in 3 mos.    Edrick Kins, DPM [facility]  Dr. Edrick Kins, [address]                                        [place], [place] [address]                Office [phone]  Fax [phone]

## 2019-11-10 NOTE — Telephone Encounter (Signed)
Great, thank you!

## 2019-11-10 NOTE — Telephone Encounter (Signed)
Pt called stating she called [place] [facility] [phone]  They told her she was not eligible for the covid booster because she was not over 65 and not immune comprised.   She stated her pharmacist wants her to get this.  Please advise  She wanted to know dm was one of the reason she can get the booster

## 2019-11-10 NOTE — Patient Instructions (Signed)
[employer]

## 2019-11-10 NOTE — Telephone Encounter (Signed)
Cc this to [facility] and [place] Pt is between 24 and 101 with an extra risk factor (diabetes and morbid obesity) Is [facility] not doing this group?  If [facility] does not doing this- is there a protocol for pharmacy admin (like a note) if she found a pharmacy giving [place]  Thanks for your help

## 2019-11-10 NOTE — Telephone Encounter (Signed)
This was just posted today on [place]'s Website:  Q:  Who is eligible for a COVID-19 booster dose?  A: Only those who received both the first and second doses of the [place] COVID-19 vaccination at least 6 months ago are eligible for a booster dose, and they must meet one of the criteria detailed below. CDC recommends:  . people 3 years and older and residents in long-term care settings should receive a booster shot of Pfizer-BioNTech's COVID-19 vaccine at least 6 months after their Pfizer-BioNTech primary series,  . people aged 47-64 years with underlying medical conditions should receive a booster shot of Pfizer-BioNTech's COVID-19 vaccine at least 6 months after their Pfizer-BioNTech primary series.   I spoke with vaccine scheduling and they said that she may have called yesterday because literally until today they were told to deny these folks.  Scheduler says that if patient will call back they will get her set up for a booster.   I spoke with patient and informed her of what I found out.  I also was able to successfully schedule her in the system for November 5th (as her 6 month is not until 10/21) for her booster at the [facility] location.  She is to receive a confirmation email in the next 24 hours.

## 2019-11-11 DIAGNOSIS — M545 Low back pain: Secondary | ICD-10-CM | POA: Diagnosis not present

## 2019-11-13 DIAGNOSIS — E669 Obesity, unspecified: Secondary | ICD-10-CM | POA: Diagnosis not present

## 2019-11-13 DIAGNOSIS — E1169 Type 2 diabetes mellitus with other specified complication: Secondary | ICD-10-CM | POA: Diagnosis not present

## 2019-11-13 DIAGNOSIS — E1142 Type 2 diabetes mellitus with diabetic polyneuropathy: Secondary | ICD-10-CM | POA: Diagnosis not present

## 2019-11-13 DIAGNOSIS — E1129 Type 2 diabetes mellitus with other diabetic kidney complication: Secondary | ICD-10-CM | POA: Diagnosis not present

## 2019-11-13 DIAGNOSIS — E1165 Type 2 diabetes mellitus with hyperglycemia: Secondary | ICD-10-CM | POA: Diagnosis not present

## 2019-11-13 DIAGNOSIS — N1831 Chronic kidney disease, stage 3a: Secondary | ICD-10-CM | POA: Diagnosis not present

## 2019-11-13 DIAGNOSIS — R809 Proteinuria, unspecified: Secondary | ICD-10-CM | POA: Diagnosis not present

## 2019-11-13 DIAGNOSIS — E1122 Type 2 diabetes mellitus with diabetic chronic kidney disease: Secondary | ICD-10-CM | POA: Diagnosis not present

## 2019-11-13 DIAGNOSIS — Z6841 Body Mass Index (BMI) 40.0 and over, adult: Secondary | ICD-10-CM | POA: Diagnosis not present

## 2019-11-16 DIAGNOSIS — M545 Low back pain, unspecified: Secondary | ICD-10-CM | POA: Diagnosis not present

## 2019-11-19 ENCOUNTER — Other Ambulatory Visit: Payer: Self-pay | Admitting: Family Medicine

## 2019-11-20 DIAGNOSIS — M545 Low back pain, unspecified: Secondary | ICD-10-CM | POA: Diagnosis not present

## 2019-11-24 DIAGNOSIS — F332 Major depressive disorder, recurrent severe without psychotic features: Secondary | ICD-10-CM | POA: Diagnosis not present

## 2019-11-24 DIAGNOSIS — F411 Generalized anxiety disorder: Secondary | ICD-10-CM | POA: Diagnosis not present

## 2019-11-24 DIAGNOSIS — F4312 Post-traumatic stress disorder, chronic: Secondary | ICD-10-CM | POA: Diagnosis not present

## 2019-11-24 DIAGNOSIS — F410 Panic disorder [episodic paroxysmal anxiety] without agoraphobia: Secondary | ICD-10-CM | POA: Diagnosis not present

## 2019-11-25 DIAGNOSIS — M545 Low back pain, unspecified: Secondary | ICD-10-CM | POA: Diagnosis not present

## 2019-11-27 DIAGNOSIS — M545 Low back pain, unspecified: Secondary | ICD-10-CM | POA: Diagnosis not present

## 2019-12-01 ENCOUNTER — Other Ambulatory Visit: Payer: Self-pay

## 2019-12-01 ENCOUNTER — Ambulatory Visit: Payer: Medicare PPO | Admitting: Podiatry

## 2019-12-01 DIAGNOSIS — I890 Lymphedema, not elsewhere classified: Secondary | ICD-10-CM | POA: Diagnosis not present

## 2019-12-01 DIAGNOSIS — E0843 Diabetes mellitus due to underlying condition with diabetic autonomic (poly)neuropathy: Secondary | ICD-10-CM | POA: Diagnosis not present

## 2019-12-01 DIAGNOSIS — M7752 Other enthesopathy of left foot: Secondary | ICD-10-CM | POA: Diagnosis not present

## 2019-12-01 DIAGNOSIS — M545 Low back pain, unspecified: Secondary | ICD-10-CM | POA: Diagnosis not present

## 2019-12-01 DIAGNOSIS — M7751 Other enthesopathy of right foot: Secondary | ICD-10-CM

## 2019-12-01 NOTE — Progress Notes (Signed)
   SUBJECTIVE Patient presents today for follow-up evaluation regarding bilateral foot pain and swelling.  Patient states that she did get the [place] shoes as instructed.  She has been wearing them for about 3 weeks now.  She continues to get swelling in the feet.  She also states that today she has been having some sensitivity and pain to the bilateral great toe joint.  She denies injury to the area.  She presents for further treatment and evaluation  Past Medical History:  Diagnosis Date  . Allergy    allergic rhinitis  . Anemia   . Anxiety   . Arthritis    osteoarthritis  . Asthma   . Claustrophobia    does not like oxygen mask covering face  . Depression   . Diabetes mellitus without complication ([place])   . Fall 2016  . Fatty liver 07/2008   abd. ultrasound - fatty liver ; slt dilated cbd (no stones ) 06/10// abd.  ultrasound normal on 04/2006  . Fibromyalgia   . GERD (gastroesophageal reflux disease)    EGD negative 06/2001// EGD erythematous mucosa, polyp 08/2008  . High uric acid in 24 hour urine specimen   . Hyperhidrosis   . Hyperlipidemia   . Kidney stones   . Lymphedema   . Obesity   . PCOS (polycystic ovarian syndrome)   . Shortness of breath dyspnea   . Tachycardia   . TMJ (dislocation of temporomandibular joint)   . UTI (lower urinary tract infection)     OBJECTIVE General Patient is awake, alert, and oriented x 3 and in no acute distress. Derm Skin is dry and supple bilateral. Negative open lesions or macerations. Remaining integument unremarkable. Nails are tender, long, thickened and dystrophic with subungual debris, consistent with onychomycosis, 1-5 bilateral. No signs of infection noted.  Hyperkeratotic preulcerative callus tissue noted to the bilateral feet and toes Vasc  DP and PT pedal pulses palpable bilaterally. Temperature gradient within normal limits.  Edema noted bilateral lower extremities Neuro Epicritic and protective threshold sensation grossly  intact bilaterally.  Musculoskeletal Exam No symptomatic pedal deformities noted bilateral. Muscular strength within normal limits.  Pain on palpation range of motion to the bilateral great toe joints IPJ  ASSESSMENT 1.  Chronic bilateral lower extremity foot pain 2.  Capsulitis bilateral great toes 3.  Bilateral lower extremity edema  PLAN OF CARE 1. Patient evaluated today.  2. Instructed to maintain good pedal hygiene and foot care.  3.  Continue wearing [place] shoes from [employer] 4.  Injection of 0.5 cc Celestone Soluspan injected into the great toe bilateral 5.  Revitaderm urea 40% lotion provided 6.  Return to clinic as needed   Edrick Kins, DPM [facility]  Dr. Edrick Kins, [address]                                        [place], [place] [address]                Office [phone]  Fax [phone]

## 2019-12-04 DIAGNOSIS — M545 Low back pain, unspecified: Secondary | ICD-10-CM | POA: Diagnosis not present

## 2019-12-08 ENCOUNTER — Encounter: Payer: Self-pay | Admitting: Student in an Organized Health Care Education/Training Program

## 2019-12-08 ENCOUNTER — Ambulatory Visit
Payer: Medicare PPO | Attending: Student in an Organized Health Care Education/Training Program | Admitting: Student in an Organized Health Care Education/Training Program

## 2019-12-08 ENCOUNTER — Other Ambulatory Visit: Payer: Self-pay

## 2019-12-08 ENCOUNTER — Other Ambulatory Visit: Payer: Self-pay | Admitting: Family Medicine

## 2019-12-08 VITALS — BP 113/84 | HR 87 | Temp 97.0°F | Resp 20 | Ht 64.0 in | Wt 333.0 lb

## 2019-12-08 DIAGNOSIS — M545 Low back pain, unspecified: Secondary | ICD-10-CM

## 2019-12-08 DIAGNOSIS — M5136 Other intervertebral disc degeneration, lumbar region: Secondary | ICD-10-CM | POA: Insufficient documentation

## 2019-12-08 DIAGNOSIS — M546 Pain in thoracic spine: Secondary | ICD-10-CM | POA: Diagnosis not present

## 2019-12-08 DIAGNOSIS — M51369 Other intervertebral disc degeneration, lumbar region without mention of lumbar back pain or lower extremity pain: Secondary | ICD-10-CM

## 2019-12-08 DIAGNOSIS — G894 Chronic pain syndrome: Secondary | ICD-10-CM

## 2019-12-08 DIAGNOSIS — M5134 Other intervertebral disc degeneration, thoracic region: Secondary | ICD-10-CM | POA: Diagnosis not present

## 2019-12-08 DIAGNOSIS — M797 Fibromyalgia: Secondary | ICD-10-CM

## 2019-12-08 DIAGNOSIS — G8929 Other chronic pain: Secondary | ICD-10-CM | POA: Diagnosis not present

## 2019-12-08 DIAGNOSIS — M5416 Radiculopathy, lumbar region: Secondary | ICD-10-CM

## 2019-12-08 DIAGNOSIS — M47816 Spondylosis without myelopathy or radiculopathy, lumbar region: Secondary | ICD-10-CM | POA: Diagnosis not present

## 2019-12-08 MED ORDER — MAGNESIUM 500 MG PO CAPS: 500.0000 mg | ORAL_CAPSULE | Freq: Every day | ORAL | 0 refills | Status: AC

## 2019-12-08 NOTE — Progress Notes (Signed)
Patient: Mackenzie Bonilla  Service Category: E/M  Provider: [name], MD  DOB: 1965/07/02  DOS: 12/08/2019  Referring Provider: Doyle Askew, MD  MRN: [id number]  Setting: Ambulatory outpatient  PCP: Abner Greenspan, MD  Type: New Patient  Specialty: Interventional Pain Management    Location: Office  Delivery: Face-to-face     Primary Reason(s) for Visit: Encounter for initial evaluation of one or more chronic problems (new to examiner) potentially causing chronic pain, and posing a threat to normal musculoskeletal function. (Level of risk: High) CC: Back Pain (upper, mid and lower)  HPI  [name] is a 54 y.o. year old, female patient, who comes for the first time to our practice referred by Girtha Hake I, MD for our initial evaluation of her chronic pain. She has Type 2 diabetes mellitus without complication, without long-term current use of insulin ([place]); PCOS (polycystic ovarian syndrome); Vitamin B 12 deficiency; VITAMIN D DEFICIENCY; Hyperlipidemia associated with type 2 diabetes mellitus ([place]); Morbid obesity ([place]); Generalized anxiety disorder; PANIC DISORDER; BULIMIA; Chronic depression; CARPAL TUNNEL SYNDROME; LYMPHEDEMA; Allergic rhinitis; Mild intermittent asthma; TMJ SYNDROME; GERD; IRRITABLE BOWEL SYNDROME; Menopausal symptoms; Osteoarthritis; CHRONIC FATIGUE SYNDROME; HYPERHIDROSIS; COUGH, CHRONIC; DIVERTICULITIS, HX OF; Hematuria; Uric acid kidney stone; Medication management; Great toe pain; Fall; Breast mass in female; Hypertriglyceridemia; Lumbar spine pain; Candidal intertrigo; Cystocele; Constipation; Urinary incontinence; Mobility impaired; Diabetic polyneuropathy associated with type 2 diabetes mellitus ([place]); Diabetic angiopathy ([place]); Vasomotor symptoms due to menopause; Gallstones; Eating disorder; UTI (urinary tract infection); Multiple joint pain; Mid back pain; Encounter for medication review and counseling; Chronic bilateral thoracic back pain; Other  intervertebral disc degeneration, lumbar region; Fibromyalgia; Chronic pain syndrome; and Lumbar radicular pain (left S1) on their problem list. Today she comes in for evaluation of her Back Pain (upper, mid and lower)  Pain Assessment: Location: Lower, Mid, Upper Back Radiating: radiates across Onset: More than a month ago Duration: Chronic pain Quality: Spasm (states like having a baby out of back) Severity: 9 /10 (subjective, self-reported pain score)  Effect on ADL: limits activities Timing: Constant Modifying factors: rest BP: 113/84   HR: 87  Onset and Duration: Sudden and Present longer than 3 months Cause of pain: moved chair Severity: NAS-11 at its worse: 10/10, NAS-11 at its best: 8/10, NAS-11 now: 9/10 and NAS-11 on the average: 8/10 Timing: Morning, Afternoon, Night, During activity or exercise, After activity or exercise and After a period of immobility Aggravating Factors: Bending, Kneeling, Lifiting, Motion, Prolonged sitting, Prolonged standing, Squatting, Stooping , Twisting, Walking, Walking uphill and Walking downhill Alleviating Factors: Stretching, Hot packs, Lying down, Medications, Resting, TENS and Chiropractic manipulations Associated Problems: Constipation, Day-time cramps, Night-time cramps, Depression, Dizziness, Fatigue, Inability to concentrate, Numbness, Sadness, Spasms, Sweating, Swelling, Tingling, Weakness and Pain that wakes patient up Quality of Pain: Aching, Agonizing, Annoying, Intermittent, Cruel, Deep, Disabling, Distressing, Dreadful, Exhausting, Horrible, Pulsating, Punishing, Sharp, Shooting, Tender, Throbbing, Tingling and Uncomfortable Previous Examinations or Tests: CT scan, Endoscopy, X-rays, Chiropractic evaluation and Psychiatric evaluation Previous Treatments: Chiropractic manipulations, Physical Therapy and TENS  Mackenzie Bonilla is a pleasant 54 year old female with multiple medical issues who presents with a chief complaint of mid thoracic and lumbar  pain.  No inciting or traumatic event.  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  Of note she has been working with physical therapy for the last 3 months.  She has done TENS therapy.  She has been evaluated by Dr. Alba Destine with physical medicine and rehab but states that  Dr. Alba Destine was not able to offer her anything for her pain.  Patient does have a vagal nerve stimulator in place for depression.  She states that this helps with her depression management.  She is unable to get an MRI because of this.  She has had thoracic x-rays which showed thoracic degenerative disc disease and thoracic facet joint syndrome.  She is on a multimodal analgesic regimen of Flexeril as needed, gabapentin 100 mg twice a day and 300 mg at bedtime as well as ibuprofen as needed.  She tried tramadol in the past which was not effective.  Patient's lumbar spine CT done approximately a year ago shows Focal disc protrusion to the left of midline at L5-S1 touching the left S1 nerve root sleeve.Severe right facet arthritis at L4-5.   Historic Controlled Substance Pharmacotherapy Review  Historical Monitoring: The patient  reports no history of drug use. List of all UDS Test(s): No results found for: MDMA, COCAINSCRNUR, [place], [place], CANNABQUANT, THCU, [place] List of other Serum/Urine Drug Screening Test(s):  No results found for: AMPHSCRSER, BARBSCRSER, BENZOSCRSER, COCAINSCRSER, COCAINSCRNUR, PCPSCRSER, PCPQUANT, THCSCRSER, THCU, CANNABQUANT, OPIATESCRSER, OXYSCRSER, PROPOXSCRSER, ETH Historical Background Evaluation: [place] PMP: PDMP reviewed during this encounter. Online review of the past 53-monthperiod conducted.             [place] Department of public safety, offender search: (Editor, commissioningInformation) Non-contributory Risk Assessment Profile: Aberrant behavior: None observed or detected today Risk factors for fatal opioid overdose: Morbidly obese, signs concerning for obstructive sleep apnea, anxiety,  depression Fatal overdose hazard ratio (HR): Calculation deferred Non-fatal overdose hazard ratio (HR): Calculation deferred Risk of opioid abuse or dependence: 0.7-3.0% with doses ? 36 MME/day and 6.1-26% with doses ? 120 MME/day. Substance use disorder (SUD) risk level: See below Personal History of Substance Abuse (SUD-Substance use disorder):  Alcohol: Negative  Illegal Drugs: Negative  Rx Drugs: Negative  ORT Risk Level calculation: Moderate Risk  Opioid Risk Tool - 12/08/19 1410      Family History of Substance Abuse   Alcohol Positive Female    Illegal Drugs Positive Female    Rx Drugs Negative      Personal History of Substance Abuse   Alcohol Negative    Illegal Drugs Negative    Rx Drugs Negative      Age   Age between 182-45years  No      History of Preadolescent Sexual Abuse   History of Preadolescent Sexual Abuse Negative or Female      Psychological Disease   Psychological Disease Positive    Depression Positive      Total Score   Opioid Risk Tool Scoring 6    Opioid Risk Interpretation Moderate Risk          ORT Scoring interpretation table:  Score <3 = Low Risk for SUD  Score between 4-7 = Moderate Risk for SUD  Score >8 = High Risk for Opioid Abuse   PHQ-2 Depression Scale:  Total score: 2  PHQ-2 Scoring interpretation table: (Score and probability of major depressive disorder)  Score 0 = No depression  Score 1 = 15.4% Probability  Score 2 = 21.1% Probability  Score 3 = 38.4% Probability  Score 4 = 45.5% Probability  Score 5 = 56.4% Probability  Score 6 = 78.6% Probability   PHQ-9 Depression Scale:  Total score: 14  PHQ-9 Scoring interpretation table:  Score 0-4 = No depression  Score 5-9 = Mild depression  Score 10-14 = Moderate depression  Score  15-19 = Moderately severe depression  Score 20-27 = Severe depression (2.4 times higher risk of SUD and 2.89 times higher risk of overuse)   Pharmacologic Plan: Non-opioid analgesic therapy  offered.            Initial impression: Poor candidate for opioid analgesics.  Meds   Current Outpatient Medications:    ACCU-CHEK AVIVA PLUS test strip, Use to check blood sugar  once a day, Disp: 100 each, Rfl: 0   ACCU-CHEK SOFTCLIX LANCETS lancets, Use to check blood sugar  once a day, Disp: 100 each, Rfl: 0   acetaminophen (TYLENOL) 500 MG tablet, Take 500 mg by mouth every 6 (six) hours as needed. , Disp: , Rfl:    albuterol (VENTOLIN HFA) 108 (90 Base) MCG/ACT inhaler, Inhale 2 puffs into the lungs every 6 (six) hours as needed for wheezing., Disp: 18 g, Rfl: 3   ALPRAZolam (XANAX) 1 MG tablet, Take 2 mg by mouth 3 (three) times daily as needed. , Disp: , Rfl:    Blood Glucose Calibration (ACCU-CHEK AVIVA) SOLN, , Disp: , Rfl:    Blood Glucose Monitoring Suppl (FIFTY50 GLUCOSE METER 2.0) w/Device KIT, Use as directed, Disp: , Rfl:    cyclobenzaprine (FLEXERIL) 5 MG tablet, TAKE ONE TABLET 3 TIMES DAILY AS NEEDED FOR MUSCLE SPASM, Disp: 30 tablet, Rfl: 3   desvenlafaxine (PRISTIQ) 100 MG 24 hr tablet, , Disp: , Rfl:    dimenhyDRINATE (DRAMAMINE) 50 MG tablet, Take 50 mg by mouth every 8 (eight) hours as needed., Disp: , Rfl:    fluticasone (FLONASE) 50 MCG/ACT nasal spray, Place into both nostrils daily., Disp: , Rfl:    gabapentin (NEURONTIN) 100 MG capsule, Take 1 capsule (100 mg total) by mouth 2 (two) times daily., Disp: 360 capsule, Rfl: 3   gabapentin (NEURONTIN) 300 MG capsule, Take 1 capsule (300 mg total) by mouth at bedtime., Disp: 90 capsule, Rfl: 3   glipiZIDE (GLUCOTROL XL) 10 MG 24 hr tablet, Take 1 tablet by mouth daily., Disp: , Rfl:    ibuprofen (ADVIL) 600 MG tablet, Take 1 tablet (600 mg total) by mouth every 8 (eight) hours as needed., Disp: 30 tablet, Rfl: 0   Insulin Degludec (TRESIBA [place]), Inject into the skin. 1 injection nightly 8-10 pm, Disp: , Rfl:    losartan (COZAAR) 25 MG tablet, , Disp: , Rfl:    nystatin cream (MYCOSTATIN), Apply 1  application topically 3 (three) times daily as needed (for yeast infection areas). To affected areas, Disp: 30 g, Rfl: 3   omeprazole (PRILOSEC) 20 MG capsule, TAKE 1 CAPSULE BY MOUTH TWICE DAILY, Disp: 180 capsule, Rfl: 1   oxybutynin (DITROPAN XL) 15 MG 24 hr tablet, TAKE 1 TABLET BY MOUTH DAILY, Disp: 90 tablet, Rfl: 1   Polyethyl Glycol-Propyl Glycol (SYSTANE OP), Apply 1 drop to eye daily as needed., Disp: , Rfl:    pravastatin (PRAVACHOL) 20 MG tablet, Take 1 tablet (20 mg total) by mouth at bedtime., Disp: 30 tablet, Rfl: 11   Simethicone (GAS-X PO), Take by mouth., Disp: , Rfl:    spironolactone (ALDACTONE) 100 MG tablet, TAKE ONE TABLET EVERY DAY, Disp: 30 tablet, Rfl: 2   traMADol (ULTRAM) 50 MG tablet, TAKE ONE TABLET TWICE A DAY IF NEEDED FOR SEVERE PAIN, Disp: 30 tablet, Rfl: 0   traZODone (DESYREL) 150 MG tablet, Take 300 mg by mouth at bedtime. , Disp: , Rfl:    TRULICITY 3 VF/6.4PP SOPN, 4.5 mg. INJECT 4.5MG (0.5ML) SUBQ EVERY 7  DAY, Disp: , Rfl:    zinc oxide (BALMEX) 11.3 % CREA cream, Apply 1 application topically 2 (two) times daily., Disp: , Rfl:    insulin glargine (LANTUS SOLOSTAR) 100 UNIT/ML Solostar Pen, 32 Units at bedtime.  (Patient not taking: Reported on 12/08/2019), Disp: , Rfl:    Magnesium 500 MG CAPS, Take 1 capsule (500 mg total) by mouth at bedtime., Disp: 180 capsule, Rfl: 0  Imaging Review    Addendum 12/25/2018  8:59 AM ADDENDUM REPORT: 12/25/2018 08:57  ADDENDUM: Review of the information on the lead shows that the length of the lead in the left neck does not allow for MR scanning. Staff on site are aware. This was discussed with Cindy Hazy at [facility], via telephone, by me at approximately 0850 hours.   Electronically Signed By: Zetta Bills M.D. On: 12/25/2018 08:57  Narrative CLINICAL DATA:  History of foreign body in soft tissue, clearance for MRI.  EXAM: CERVICAL SPINE - 2-3 VIEW  COMPARISON:  Prior imaging  from 2018, a chest x-ray.  FINDINGS: Cervical spine degenerative changes greatest at C4-5 and C5-6.  Foreign body compatible with vagal nerve stimulator leads remain in the left neck. Lead length greater than 2 cm, approximately 10-12 cm remain in the left neck  No signs of acute finding in the visualized cervical spine, study acquired for evaluation of nerve stimulator.  IMPRESSION: Long segment of abdandoned vagal with nerve stimulator in the left neck, refer to lead-type and MRI safety reference to determine whether scan is possible.  Electronically Signed: By: Zetta Bills M.D. On: 12/25/2018 08:43 DG Thoracic Spine 2 View  Narrative CLINICAL DATA:  Dorsalgia  EXAM: THORACIC SPINE 3 VIEWS  COMPARISON:  None.  FINDINGS: Frontal, lateral, and swimmer's views were obtained. There is midthoracic dextroscoliosis. There is no fracture or spondylolisthesis. There is mild disc space narrowing at several levels. No erosive change or paraspinous lesions. There are anterior and right lateral osteophytes at several levels. No erosive change or paraspinous lesion. Visualized lungs clear.  IMPRESSION: Scoliosis. Disc space narrowing and osteophyte formation at several levels. No fracture or spondylolisthesis.   Electronically Signed By: Lowella Grip III M.D. On: 09/30/2019 11:35 Narrative CLINICAL DATA:  Left buttock and leg pain and numbness for 1 month. No known injury.  EXAM: CT LUMBAR SPINE WITHOUT CONTRAST  TECHNIQUE: Multidetector CT imaging of the lumbar spine was performed without intravenous contrast administration. Multiplanar CT image reconstructions were also generated.  COMPARISON:  Lumbar radiographs dated 01/12/2015  FINDINGS: Segmentation: 5 lumbar type vertebrae.  Alignment: Normal.  Vertebrae: No fracture or bone destruction. Prominent right facet arthritis at L4-5.  Paraspinal and other soft tissues: Negative.  Disc levels: T11-12:  Normal disc. Moderate right facet arthritis.  T12-L1: Disc. Minimal facet arthritis.  L1-2: Normal.  L2-3: Normal disc. Minimal right facet arthritis.  L3-4: Normal disc. Moderate right facet arthritis.  L4-5: There is slight increased density to the left of midline which I think is artifact. This does not persist on series 8. Severe right facet arthritis.  L5-S1: Focal disc protrusion to the left of midline touching the left S1 nerve root sleeve, best seen on image 24 of series 8.  IMPRESSION: 1. Focal disc protrusion to the left of midline at L5-S1 touching the left S1 nerve root sleeve. 2. Severe right facet arthritis at L4-5.   Electronically Signed By: Lorriane Shire M.D. On: 12/29/2018 10:14   Narrative CLINICAL DATA:  Fall.  Low back injury  and pain.  Initial encounter.  EXAM: LUMBAR SPINE - 2-3 VIEW  COMPARISON:  None.  FINDINGS: There is no evidence of lumbar spine fracture. Alignment is normal. Intervertebral disc spaces are maintained. No other significant bone abnormality identified.  IMPRESSION: Negative lumbar spine radiographs.   Electronically Signed By: Earle Gell M.D. On: 01/12/2015 18:10   DG Knee 2 Views Right  Narrative *RADIOLOGY REPORT*  Clinical Data: History of chronic knee pain.  No history of trauma.  RIGHT KNEE - 1-2 VIEW  Comparison: AP standing image of same date.  Findings: There is narrowing of the medial joint space.  Minimal degenerative spurring is seen.  No fracture bony destruction is evident.  No definite joint effusion is seen.  No chondrocalcinosis or loose body is seen.  IMPRESSION: Narrowing of the medial joint space with spurring.  Patellar spurring.  No fracture or bony destruction.  Original Report Authenticated By: Delane Ginger, M.D.   DG Knee Complete 4 Views Right  Narrative CLINICAL DATA:  Right knee pain status post fall  EXAM: RIGHT KNEE - COMPLETE 4+ VIEW  COMPARISON:  Right knee series  of Jul 13, 2010  FINDINGS: The study is degraded due to scatter effects from the overlying soft tissues. The bones are adequately mineralized. There is narrowing of all 3 joint compartments. There is beaking of the tibial spines. There is a lateral tibial plateau osteophyte. There is no joint effusion. Mild soft tissue swelling anteriorly is present.  IMPRESSION: There is no acute bony abnormality. There are moderate degenerative changes.   Electronically Signed By: David  [place] On: 07/24/2013 08:38   Ankle Imaging: Ankle-R DG Complete: Results for orders placed during the hospital encounter of 01/12/15  DG Ankle Complete Right  Narrative CLINICAL DATA:  Fall, twisting right ankle injury, obesity  EXAM: RIGHT ANKLE - COMPLETE 3+ VIEW  COMPARISON:  None.  FINDINGS: Normal alignment without acute ankle fracture. Distal tibia, distal fibula, talus and calcaneus appear intact. Minor degenerative changes of the midfoot.  There is a nondisplaced acute fracture of the right fifth metatarsal base. No subluxation or dislocation.  IMPRESSION: Acute nondisplaced fracture right fifth metatarsal base.   Electronically Signed By: Jerilynn Mages.  Shick M.D. On: 01/12/2015 18:10    Complexity Note: Imaging results reviewed. Results shared with Ms. Hardin Negus, using [employer].                         ROS  Cardiovascular: High blood pressure Pulmonary or Respiratory: Wheezing and difficulty taking a deep full breath (Asthma) and Shortness of breath Neurological: Abnormal skin sensations (Peripheral Neuropathy) and Curved spine Psychological-Psychiatric: Psychiatric disorder, Anxiousness, Depressed and Prone to panicking Gastrointestinal: Reflux or heatburn and Alternating episodes iof diarrhea and constipation (IBS-Irritable bowe syndrome) Genitourinary: Passing kidney stones Hematological: No reported hematological signs or symptoms such as prolonged bleeding, low or poor  functioning platelets, bruising or bleeding easily, hereditary bleeding problems, low energy levels due to low hemoglobin or being anemic Endocrine: High blood sugar requiring insulin (IDDM) Rheumatologic: Joint aches and or swelling due to excess weight (Osteoarthritis), Generalized muscle aches (Fibromyalgia) and Constant unexplained fatigue (Chronic Fatigue Syndrome) Musculoskeletal: Negative for myasthenia gravis, muscular dystrophy, multiple sclerosis or malignant hyperthermia Work History: Retired  Allergies  Ms. Mahurin is allergic to cephalexin, codeine, duloxetine, levaquin [levofloxacin], lisinopril, metformin and related, quetiapine, sertraline hcl, triazolam, zaleplon, zocor [simvastatin - high dose], zolpidem tartrate, hydrocodone, and norelgestromin-eth estradiol.  Laboratory Chemistry Profile   Renal Lab  Results  Component Value Date   BUN 15 11/12/2018   CREATININE 1.10 11/12/2018   BCR 10 05/21/2017   GFR 51.89 (L) 11/12/2018   GFRAA 72 05/21/2017   GFRNONAA 62 05/21/2017   SPECGRAV 1.015 04/14/2018   PHUR 6.0 04/14/2018   PROTEINUR Positive (A) 04/14/2018     Electrolytes Lab Results  Component Value Date   NA 140 11/12/2018   K 4.7 11/12/2018   CL 101 11/12/2018   CALCIUM 9.7 11/12/2018   PHOS 4.0 03/07/2009     Hepatic Lab Results  Component Value Date   AST 15 11/12/2018   ALT 19 11/12/2018   ALBUMIN 4.3 11/12/2018   ALKPHOS 70 11/12/2018   AMYLASE 34 09/05/2016   LIPASE 22.0 09/05/2016     ID Lab Results  Component Value Date   SARSCOV2NAA NEGATIVE 08/14/2019     Bone Lab Results  Component Value Date   VD25OH 26 (L) 11/14/2011   TESTOFREE 2.1 05/21/2017   TESTOSTERONE 12 05/21/2017     Endocrine Lab Results  Component Value Date   GLUCOSE 201 (H) 11/12/2018   GLUCOSEU NEGATIVE 02/16/2017   HGBA1C 7.0 04/17/2017   TSH 1.80 11/12/2018   FREET4 0.72 06/29/2014   TESTOFREE 2.1 05/21/2017   TESTOSTERONE 12 05/21/2017   SHBG 55.9  05/21/2017     Neuropathy Lab Results  Component Value Date   VITAMINB12 182 (L) 11/12/2018   FOLATE 5.4 05/29/2007   HGBA1C 7.0 04/17/2017     CNS No results found for: COLORCSF, APPEARCSF, RBCCOUNTCSF, WBCCSF, POLYSCSF, LYMPHSCSF, EOSCSF, PROTEINCSF, GLUCCSF, JCVIRUS, CSFOLI, IGGCSF, LABACHR, ACETBL, LABACHR, ACETBL   Inflammation (CRP: Acute   ESR: Chronic) Lab Results  Component Value Date   ESRSEDRATE 31 (H) 11/12/2018   LATICACIDVEN 1.11 06/23/2015     Rheumatology Lab Results  Component Value Date   RF <14 11/12/2018   ANA NEGATIVE 11/12/2018   LABURIC 3.4 05/12/2013     Coagulation Lab Results  Component Value Date   PLT 229.0 11/12/2018     Cardiovascular Lab Results  Component Value Date   CKTOTAL 43 06/25/2007   CKMB 0.3 06/25/2007   TROPONINI <0.01        NO INDICATION OF MYOCARDIAL INJURY. 06/25/2007   HGB 14.8 11/12/2018   HCT 44.8 11/12/2018     Screening Lab Results  Component Value Date   SARSCOV2NAA NEGATIVE 08/14/2019     Cancer No results found for: CEA, CA125, LABCA2   Allergens No results found for: ALMOND, APPLE, ASPARAGUS, AVOCADO, BANANA, BARLEY, BASIL, BAYLEAF, GREENBEAN, LIMABEAN, WHITEBEAN, BEEFIGE, REDBEET, BLUEBERRY, BROCCOLI, CABBAGE, MELON, CARROT, CASEIN, CASHEWNUT, CAULIFLOWER, CELERY     Note: Lab results reviewed.  [place]  Drug: Ms. Hahne  reports no history of drug use. Alcohol:  reports no history of alcohol use. Tobacco:  reports that she has never smoked. She has never used smokeless tobacco. Medical:  has a past medical history of Allergy, Anemia, Anxiety, Arthritis, Asthma, Claustrophobia, Depression, Diabetes mellitus without complication ([place]), Fall (2016), Fatty liver (07/2008), Fibromyalgia, GERD (gastroesophageal reflux disease), High uric acid in 24 hour urine specimen, Hyperhidrosis, Hyperlipidemia, Kidney stones, Lymphedema, Obesity, PCOS (polycystic ovarian syndrome), Shortness of breath dyspnea,  Tachycardia, TMJ (dislocation of temporomandibular joint), and UTI (lower urinary tract infection). Family: family history includes Cancer in her father; Diabetes in her maternal grandmother and paternal grandmother; Heart disease (age of onset: 5) in her maternal grandmother; Heart disease (age of onset: 77) in her paternal grandmother; Hypertension in her father and sister.  Past Surgical History:  Procedure Laterality Date   BREAST BIOPSY Right 2016   neg   BREAST LUMPECTOMY WITH RADIOACTIVE SEED LOCALIZATION Right 08/02/2014   Procedure: BREAST LUMPECTOMY WITH RADIOACTIVE SEED LOCALIZATION;  Surgeon: Rolm Bookbinder, MD;  Location: [facility];  Service: General;  Laterality: Right;   COLONOSCOPY  08/12/2012   Completed by Dr. Phylis Bougie, normal exam.    ENDOMETRIAL BIOPSY  01/2004   ESOPHAGOGASTRODUODENOSCOPY     ESOPHAGOGASTRODUODENOSCOPY (EGD) WITH PROPOFOL N/A 08/18/2019   Procedure: ESOPHAGOGASTRODUODENOSCOPY (EGD) WITH PROPOFOL;  Surgeon: Lucilla Lame, MD;  [facility] END[facility]COPY;  Service: Endoscopy;  Laterality: N/A;   FOOT SURGERY     SEPTOPLASTY     two times   TONSILLECTOMY     UTERINE FIBROID SURGERY  06/2005   ablation   uterine tumor  09/2001   VAGUS NERVE STIMULATOR INSERTION     Active Ambulatory Problems    Diagnosis Date Noted   Type 2 diabetes mellitus without complication, without long-term current use of insulin ([place]) 08/23/2008   PCOS (polycystic ovarian syndrome) 10/16/2006   Vitamin B 12 deficiency 06/04/2007   VITAMIN D DEFICIENCY 05/07/2008   Hyperlipidemia associated with type 2 diabetes mellitus ([place]) 10/16/2006   Morbid obesity ([place]) 10/16/2006   Generalized anxiety disorder 10/16/2006   PANIC DISORDER 10/16/2006   BULIMIA 10/16/2006   Chronic depression 10/16/2006   CARPAL TUNNEL SYNDROME 10/16/2006   LYMPHEDEMA 10/16/2006   Allergic rhinitis 10/16/2006   Mild intermittent asthma 10/16/2006   TMJ SYNDROME 10/16/2006   GERD  10/16/2006   IRRITABLE BOWEL SYNDROME 10/16/2006   Menopausal symptoms 06/17/2009   Osteoarthritis 10/16/2006   CHRONIC FATIGUE SYNDROME 11/15/2006   HYPERHIDROSIS 11/07/2009   COUGH, CHRONIC 10/16/2006   DIVERTICULITIS, HX OF 11/15/2006   Hematuria 08/09/2010   Uric acid kidney stone 11/20/2011   Medication management 11/20/2012   Great toe pain 05/12/2013   Fall 07/23/2013   Breast mass in female 07/28/2014   Hypertriglyceridemia 10/14/2014   Lumbar spine pain 12/28/2014   Candidal intertrigo 04/06/2015   Cystocele 04/06/2015   Constipation 04/06/2015   Urinary incontinence 12/02/2015   Mobility impaired 11/12/2016   Diabetic polyneuropathy associated with type 2 diabetes mellitus ([place]) 04/07/2017   Diabetic angiopathy ([place]) 04/07/2017   Vasomotor symptoms due to menopause 04/07/2017   Gallstones 04/19/2017   Eating disorder 05/23/2017   UTI (urinary tract infection) 04/08/2018   Multiple joint pain 11/12/2018   Mid back pain 02/11/2019   Encounter for medication review and counseling 10/06/2019   Chronic bilateral thoracic back pain 12/08/2019   Other intervertebral disc degeneration, lumbar region 12/08/2019   Fibromyalgia 12/08/2019   Chronic pain syndrome 12/08/2019   Lumbar radicular pain (left S1) 12/08/2019   Resolved Ambulatory Problems    Diagnosis Date Noted   HEADACHE, TENSION 10/16/2006   PROCTITIS 07/29/2008   INTERTRIGO, CANDIDAL 09/21/2009   SKIN TAG 10/28/2009   SEBORRHEIC KERATOSIS, INFLAMED 06/17/2009   Myalgia and myositis 10/16/2006   DIARRHEA, ACUTE, CHRONIC 07/29/2008   Right knee pain 07/05/2010   UTI (lower urinary tract infection) 08/02/2010   Acute bacterial sinusitis 08/02/2010   Vaginal bleeding 08/09/2010   Acute constipation 12/25/2011   Left foot pain 02/11/2012   Conjunctivitis, acute 02/19/2012   Abdominal pain 04/23/2012   Viral URI with cough 12/05/2012   Insect bite  06/05/2013   Right knee pain 07/23/2013   Acute sinusitis 08/11/2013   Ingrown toenail 08/11/2013   Acute bacterial sinusitis 10/21/2013   Right otitis media 10/21/2013  UTI (urinary tract infection) 03/02/2014   Acute sinusitis 12/28/2014   Yeast vaginitis 12/28/2014   Urine frequency 12/28/2014   Viral sinusitis 12/02/2015   Diarrhea 09/05/2016   Right ear pain 02/20/2017   Abdominal pain, right upper quadrant 05/01/2017   Cutaneous skin tags 08/22/2017   Past Medical History:  Diagnosis Date   Allergy    Anemia    Anxiety    Arthritis    Asthma    Claustrophobia    Depression    Diabetes mellitus without complication ([place])    Fatty liver 07/2008   GERD (gastroesophageal reflux disease)    High uric acid in 24 hour urine specimen    Hyperhidrosis    Hyperlipidemia    Kidney stones    Lymphedema    Obesity    Shortness of breath dyspnea    Tachycardia    TMJ (dislocation of temporomandibular joint)    Constitutional Exam  General appearance: alert, cooperative and morbidly obese Vitals:   12/08/19 1355  BP: 113/84  Pulse: 87  Resp: 20  Temp: (!) 97 F (36.1 C)  SpO2: 96%  Weight: (!) 333 lb (151 kg)  Height: 5' 4" (1.626 m)   BMI Assessment: Estimated body mass index is 57.16 kg/m as calculated from the following:   Height as of this encounter: 5' 4" (1.626 m).   Weight as of this encounter: 333 lb (151 kg).  BMI interpretation table: BMI level Category Range association with higher incidence of chronic pain  <18 kg/m2 Underweight   18.5-24.9 kg/m2 Ideal body weight   25-29.9 kg/m2 Overweight Increased incidence by 20%  30-34.9 kg/m2 Obese (Class I) Increased incidence by 68%  35-39.9 kg/m2 Severe obesity (Class II) Increased incidence by 136%  >40 kg/m2 Extreme obesity (Class III) Increased incidence by 254%   Patient's current BMI Ideal Body weight  Body mass index is 57.16 kg/m. Ideal body weight: 54.7 kg (120 lb  9.5 oz) Adjusted ideal body weight: 93.2 kg (205 lb 8.9 oz)   BMI Readings from Last 4 Encounters:  12/08/19 57.16 kg/m  11/04/19 56.82 kg/m  10/06/19 56.83 kg/m  09/30/19 57.16 kg/m   Wt Readings from Last 4 Encounters:  12/08/19 (!) 333 lb (151 kg)  11/04/19 (!) 331 lb (150.1 kg)  10/06/19 (!) 331 lb 1 oz (150.2 kg)  09/30/19 (!) 333 lb (151 kg)    Psych/Mental status: Alert, oriented x 3 (person, place, & time)       Eyes: PERLA Respiratory: No evidence of acute respiratory distress  Cervical Spine Exam  Skin & Axial Inspection: No masses, redness, edema, swelling, or associated skin lesions Alignment: Symmetrical Functional ROM: Unrestricted ROM      Stability: No instability detected Muscle Tone/Strength: Functionally intact. No obvious neuro-muscular anomalies detected. Sensory (Neurological): Unimpaired Palpation: No palpable anomalies              Upper Extremity (UE) Exam    Side: Right upper extremity  Side: Left upper extremity  Skin & Extremity Inspection: Skin color, temperature, and hair growth are WNL. No peripheral edema or cyanosis. No masses, redness, swelling, asymmetry, or associated skin lesions. No contractures.  Skin & Extremity Inspection: Skin color, temperature, and hair growth are WNL. No peripheral edema or cyanosis. No masses, redness, swelling, asymmetry, or associated skin lesions. No contractures.  Functional ROM: Unrestricted ROM          Functional ROM: Unrestricted ROM          Muscle Tone/Strength:  Functionally intact. No obvious neuro-muscular anomalies detected.   Muscle Tone/Strength: Functionally intact. No obvious neuro-muscular anomalies detected.  Sensory (Neurological): Unimpaired          Sensory (Neurological): Unimpaired          Palpation: No palpable anomalies              Palpation: No palpable anomalies              Provocative Test(s):  Phalen's test: deferred Tinel's test: deferred Apley's scratch test (touch opposite  shoulder):  Action 1 (Across chest): deferred Action 2 (Overhead): deferred Action 3 (LB reach): deferred   Provocative Test(s):  Phalen's test: deferred Tinel's test: deferred Apley's scratch test (touch opposite shoulder):  Action 1 (Across chest): deferred Action 2 (Overhead): deferred Action 3 (LB reach): deferred    Thoracic Spine Area Exam  Skin & Axial Inspection: No masses, redness, or swelling Alignment: Symmetrical Functional ROM: Pain restricted ROM Stability: No instability detected Muscle Tone/Strength: Functionally intact. No obvious neuro-muscular anomalies detected. Sensory (Neurological): Musculoskeletal pain pattern Muscle strength & Tone: No palpable anomalies  Lumbar Exam  Skin & Axial Inspection: No masses, redness, or swelling Alignment: Symmetrical Functional ROM: Pain restricted ROM       Stability: No instability detected Muscle Tone/Strength: Functionally intact. No obvious neuro-muscular anomalies detected. Sensory (Neurological): Musculoskeletal pain pattern Palpation: No palpable anomalies       Provocative Tests: Hyperextension/rotation test: (+) bilaterally for facet joint pain. Lumbar quadrant test (Kemp's test): (+) bilaterally for facet joint pain. Lateral bending test: deferred today       Patrick's Maneuver: deferred today                   FABER* test: deferred today                   S-I anterior distraction/compression test: deferred today         S-I lateral compression test: deferred today         S-I Thigh-thrust test: deferred today         S-I Gaenslen's test: deferred today         *(Flexion, ABduction and External Rotation)  Gait & Posture Assessment  Ambulation: Limited Gait: Modified gait pattern (slower gait speed, wider stride width, and longer stance duration) associated with morbid obesity Posture: Difficulty standing up straight, due to pain   Lower Extremity Exam    Side: Right lower extremity  Side: Left lower  extremity  Stability: No instability observed          Stability: No instability observed          Skin & Extremity Inspection: Skin color, temperature, and hair growth are WNL. No peripheral edema or cyanosis. No masses, redness, swelling, asymmetry, or associated skin lesions. No contractures.  Skin & Extremity Inspection: Skin color, temperature, and hair growth are WNL. No peripheral edema or cyanosis. No masses, redness, swelling, asymmetry, or associated skin lesions. No contractures.  Functional ROM: Pain restricted ROM for hip and knee joints Limited SLR (straight leg raise)  Functional ROM: Pain restricted ROM for hip and knee joints Limited SLR (straight leg raise)  Muscle Tone/Strength: Functionally intact. No obvious neuro-muscular anomalies detected.  Muscle Tone/Strength: Functionally intact. No obvious neuro-muscular anomalies detected.  Sensory (Neurological): Arthropathic arthralgia        Sensory (Neurological): Arthropathic arthralgia        DTR: Patellar: deferred today Achilles: deferred today Plantar: deferred today  DTR: Patellar: deferred today Achilles: deferred today Plantar: deferred today  Palpation: No palpable anomalies  Palpation: No palpable anomalies   Assessment  Primary Diagnosis & Pertinent Problem List: The primary encounter diagnosis was Chronic bilateral thoracic back pain. Diagnoses of Lumbar radicular pain (left S1), Lumbar facet arthropathy (severe right L4-L5), Lumbar degenerative disc disease, Lumbar spine pain, Fibromyalgia, Morbid obesity (HCC), Chronic pain syndrome, Other intervertebral disc degeneration, lumbar region, and Other intervertebral disc degeneration, thoracic region were also pertinent to this visit.  Visit Diagnosis (New problems to examiner): 1. Chronic bilateral thoracic back pain   2. Lumbar radicular pain (left S1)   3. Lumbar facet arthropathy (severe right L4-L5)   4. Lumbar degenerative disc disease   5. Lumbar spine pain    6. Fibromyalgia   7. Morbid obesity ([place])   8. Chronic pain syndrome   9. Other intervertebral disc degeneration, lumbar region   10. Other intervertebral disc degeneration, thoracic region    Plan of Care (Initial workup plan)  General Recommendations: The pain condition that the patient suffers from is best treated with a multidisciplinary approach that involves an increase in physical activity to prevent de-conditioning and worsening of the pain cycle, as well as psychological counseling (formal and/or informal) to address the co-morbid psychological affects of pain. Treatment will often involve judicious use of pain medications and interventional procedures to decrease the pain, allowing the patient to participate in the physical activity that will ultimately produce long-lasting pain reductions. The goal of the multidisciplinary approach is to return the patient to a higher level of overall function and to restore their ability to perform activities of daily living.  1.  Discussed weight loss strategies, healthy eating and how morbid obesity can amplify chronic pain syndrome 2.  Continue with physical therapy 3.  Repeat CT of lumbar spine without contrast and obtain CT of thoracic spine without contrast.  Previous CT of lumbar spine shows right L4-L5 facet arthrosis and left S1 nerve root compression.  Will compare with new CT and can consider repeat lumbar facet medial branch nerve block versus lumbar epidural steroid injection. 4.  Magnesium for musculoskeletal pain and cramps   Lab Orders     ToxASSURE Select 13 (MW), Urine  Imaging Orders     CT LUMBAR SPINE WO CONTRAST     CT THORACIC SPINE WO CONTRAST  Pharmacotherapy (current): Medications ordered:  Meds ordered this encounter  Medications   Magnesium 500 MG CAPS    Sig: Take 1 capsule (500 mg total) by mouth at bedtime.    Dispense:  180 capsule    Refill:  0    Fill one day early if pharmacy is closed on scheduled  refill date. May substitute for generic, or similar, if available.   Medications administered during this visit: Catia Todorov. [employer]" had no medications administered during this visit.   Pharmacological management options:  Opioid Analgesics: We will focus on nonopioid analgesics, medication management to continue with PCP  Membrane stabilizer: Adequate regimen  Muscle relaxant: Adequate regimen  NSAID: Adequate regimen  Other analgesic(s): To be determined at a later time   Interventional management options: Ms. Leopard was informed that there is no guarantee that she would be a candidate for interventional therapies. The decision will be based on the results of diagnostic studies, as well as Ms. Barlett's risk profile.  Procedure(s) under consideration:  Lumbar facet medial branch nerve blocks Lumbar epidural steroid injection   Provider-requested follow-up: Return for pt will  call for 2nd pt visit After Imaging.  Future Appointments  Date Time Provider [place]  12/15/2019 10:30 AM [facility]  02/09/2020  2:15 PM Edrick Kins, DPM [facility]    Note by: [name], MD Date: 12/08/2019; Time: 3:52 PM

## 2019-12-08 NOTE — Patient Instructions (Signed)

## 2019-12-08 NOTE — Progress Notes (Signed)
Safety precautions to be maintained throughout the outpatient stay will include: orient to surroundings, keep bed in low position, maintain call bell within reach at all times, provide assistance with transfer out of bed and ambulation.  

## 2019-12-09 DIAGNOSIS — F411 Generalized anxiety disorder: Secondary | ICD-10-CM | POA: Diagnosis not present

## 2019-12-09 DIAGNOSIS — F4312 Post-traumatic stress disorder, chronic: Secondary | ICD-10-CM | POA: Diagnosis not present

## 2019-12-09 DIAGNOSIS — F410 Panic disorder [episodic paroxysmal anxiety] without agoraphobia: Secondary | ICD-10-CM | POA: Diagnosis not present

## 2019-12-09 DIAGNOSIS — F332 Major depressive disorder, recurrent severe without psychotic features: Secondary | ICD-10-CM | POA: Diagnosis not present

## 2019-12-11 LAB — TOXASSURE SELECT 13 (MW), URINE

## 2019-12-15 ENCOUNTER — Other Ambulatory Visit: Payer: Self-pay | Admitting: Internal Medicine

## 2019-12-15 ENCOUNTER — Ambulatory Visit: Payer: Medicare PPO | Attending: Internal Medicine

## 2019-12-15 DIAGNOSIS — Z23 Encounter for immunization: Secondary | ICD-10-CM

## 2019-12-15 NOTE — Progress Notes (Signed)
° °  Covid-19 Vaccination Clinic  Name:  Mackenzie Bonilla    MRN: [id number] DOB: Jun 05, 1965  12/15/2019  [name] was observed post Covid-19 immunization for 15 minutes without incident. She was provided with Vaccine Information Sheet and instruction to access the V-Safe system.   [name] was instructed to call [phone] with any severe reactions post vaccine:  Difficulty breathing   Swelling of face and throat   A fast heartbeat   A bad rash all over body   Dizziness and weakness

## 2019-12-23 ENCOUNTER — Ambulatory Visit
Admission: RE | Admit: 2019-12-23 | Discharge: 2019-12-23 | Disposition: A | Discharge status: Home / Self Care | Payer: Medicare PPO | Source: Ambulatory Visit | Attending: Student in an Organized Health Care Education/Training Program | Admitting: Student in an Organized Health Care Education/Training Program

## 2019-12-23 ENCOUNTER — Other Ambulatory Visit: Payer: Self-pay

## 2019-12-23 DIAGNOSIS — G894 Chronic pain syndrome: Secondary | ICD-10-CM

## 2019-12-23 DIAGNOSIS — M545 Low back pain, unspecified: Secondary | ICD-10-CM

## 2019-12-23 DIAGNOSIS — M5136 Other intervertebral disc degeneration, lumbar region: Secondary | ICD-10-CM

## 2019-12-23 DIAGNOSIS — M419 Scoliosis, unspecified: Secondary | ICD-10-CM | POA: Diagnosis not present

## 2019-12-23 DIAGNOSIS — M546 Pain in thoracic spine: Secondary | ICD-10-CM

## 2019-12-23 DIAGNOSIS — M5414 Radiculopathy, thoracic region: Secondary | ICD-10-CM

## 2019-12-23 DIAGNOSIS — M5134 Other intervertebral disc degeneration, thoracic region: Secondary | ICD-10-CM | POA: Insufficient documentation

## 2019-12-23 DIAGNOSIS — G8929 Other chronic pain: Secondary | ICD-10-CM | POA: Insufficient documentation

## 2019-12-23 DIAGNOSIS — M5124 Other intervertebral disc displacement, thoracic region: Secondary | ICD-10-CM | POA: Diagnosis not present

## 2019-12-25 DIAGNOSIS — M9904 Segmental and somatic dysfunction of sacral region: Secondary | ICD-10-CM | POA: Diagnosis not present

## 2019-12-25 DIAGNOSIS — M5431 Sciatica, right side: Secondary | ICD-10-CM | POA: Diagnosis not present

## 2019-12-25 DIAGNOSIS — M9905 Segmental and somatic dysfunction of pelvic region: Secondary | ICD-10-CM | POA: Diagnosis not present

## 2019-12-25 DIAGNOSIS — M9903 Segmental and somatic dysfunction of lumbar region: Secondary | ICD-10-CM | POA: Diagnosis not present

## 2019-12-28 DIAGNOSIS — E119 Type 2 diabetes mellitus without complications: Secondary | ICD-10-CM | POA: Diagnosis not present

## 2019-12-28 LAB — HM DIABETES EYE EXAM

## 2019-12-29 ENCOUNTER — Ambulatory Visit
Payer: Medicare PPO | Attending: Student in an Organized Health Care Education/Training Program | Admitting: Student in an Organized Health Care Education/Training Program

## 2019-12-29 ENCOUNTER — Other Ambulatory Visit: Payer: Self-pay

## 2019-12-29 ENCOUNTER — Encounter: Payer: Self-pay | Admitting: Student in an Organized Health Care Education/Training Program

## 2019-12-29 VITALS — BP 103/74 | HR 87 | Temp 97.3°F | Resp 18 | Ht 64.0 in | Wt 333.0 lb

## 2019-12-29 DIAGNOSIS — M47816 Spondylosis without myelopathy or radiculopathy, lumbar region: Secondary | ICD-10-CM | POA: Diagnosis not present

## 2019-12-29 DIAGNOSIS — G894 Chronic pain syndrome: Secondary | ICD-10-CM | POA: Insufficient documentation

## 2019-12-29 DIAGNOSIS — M5136 Other intervertebral disc degeneration, lumbar region: Secondary | ICD-10-CM | POA: Diagnosis not present

## 2019-12-29 DIAGNOSIS — M545 Low back pain, unspecified: Secondary | ICD-10-CM | POA: Insufficient documentation

## 2019-12-29 NOTE — Patient Instructions (Signed)
Facet Blocks Patient Information  Description: The facets are joints in the spine between the vertebrae.  Like any joints in the body, facets can become irritated and painful.  Arthritis can also effect the facets.  By injecting steroids and local anesthetic in and around these joints, we can temporarily block the nerve supply to them.  Steroids act directly on irritated nerves and tissues to reduce selling and inflammation which often leads to decreased pain.  Facet blocks may be done anywhere along the spine from the neck to the low back depending upon the location of your pain.   After numbing the skin with local anesthetic (like Novocaine), a small needle is passed onto the facet joints under x-ray guidance.  You may experience a sensation of pressure while this is being done.  The entire block usually lasts about 15-25 minutes.   Conditions which may be treated by facet blocks:   Low back/buttock pain  Neck/shoulder pain  Certain types of headaches  Preparation for the injection:  1. Do not eat any solid food or dairy products within 8 hours of your appointment. 2. You may drink clear liquid up to 3 hours before appointment.  Clear liquids include water, black coffee, juice or soda.  No milk or cream please. 3. You may take your regular medication, including pain medications, with a sip of water before your appointment.  Diabetics should hold regular insulin (if taken separately) and take 1/2 normal NPH dose the morning of the procedure.  Carry some sugar containing items with you to your appointment. 4. A driver must accompany you and be prepared to drive you home after your procedure. 5. Bring all your current medications with you. 6. An IV may be inserted and sedation may be given at the discretion of the physician. 7. A blood pressure cuff, EKG and other monitors will often be applied during the procedure.  Some patients may need to have extra oxygen administered for a short  period. 8. You will be asked to provide medical information, including your allergies and medications, prior to the procedure.  We must know immediately if you are taking blood thinners (like Coumadin/Warfarin) or if you are allergic to IV iodine contrast (dye).  We must know if you could possible be pregnant.  Possible side-effects:   Bleeding from needle site  Infection (rare, may require surgery)  Nerve injury (rare)  Numbness & tingling (temporary)  Difficulty urinating (rare, temporary)  Spinal headache (a headache worse with upright posture)  Light-headedness (temporary)  Pain at injection site (serveral days)  Decreased blood pressure (rare, temporary)  Weakness in arm/leg (temporary)  Pressure sensation in back/neck (temporary)   Call if you experience:   Fever/chills associated with headache or increased back/neck pain  Headache worsened by an upright position  New onset, weakness or numbness of an extremity below the injection site  Hives or difficulty breathing (go to the emergency room)  Inflammation or drainage at the injection site(s)  Severe back/neck pain greater than usual  New symptoms which are concerning to you  Please note:  Although the local anesthetic injected can often make your back or neck feel good for several hours after the injection, the pain will likely return. It takes 3-7 days for steroids to work.  You may not notice any pain relief for at least one week.  If effective, we will often do a series of 2-3 injections spaced 3-6 weeks apart to maximally decrease your pain.  After the initial   series, you may be a candidate for a more permanent nerve block of the facets.  If you have any questions, please call #[phone]) [facility]  ____________________________________________________________________________________________  Preparing for Procedure with Sedation  Procedure appointments are  limited to planned procedures: . No Prescription Refills. . No disability issues will be discussed. . No medication changes will be discussed.  Instructions: . Oral Intake: Do not eat or drink anything for at least 8 hours prior to your procedure. (Exception: Blood Pressure Medication. See below.) . Transportation: Unless otherwise stated by your physician, you may drive yourself after the procedure. . Blood Pressure Medicine: Do not forget to take your blood pressure medicine with a sip of water the morning of the procedure. If your Diastolic (lower reading)is above 100 mmHg, elective cases will be cancelled/rescheduled. . Blood thinners: These will need to be stopped for procedures. Notify our staff if you are taking any blood thinners. Depending on which one you take, there will be specific instructions on how and when to stop it. . Diabetics on insulin: Notify the staff so that you can be scheduled 1st case in the morning. If your diabetes requires high dose insulin, take only  of your normal insulin dose the morning of the procedure and notify the staff that you have done so. . Preventing infections: Shower with an antibacterial soap the morning of your procedure. . Build-up your immune system: Take 1000 mg of Vitamin C with every meal (3 times a day) the day prior to your procedure. Marland Kitchen Antibiotics: Inform the staff if you have a condition or reason that requires you to take antibiotics before dental procedures. . Pregnancy: If you are pregnant, call and cancel the procedure. . Sickness: If you have a cold, fever, or any active infections, call and cancel the procedure. . Arrival: You must be in the facility at least 30 minutes prior to your scheduled procedure. . Children: Do not bring children with you. . Dress appropriately: Bring dark clothing that you would not mind if they get stained. . Valuables: Do not bring any jewelry or valuables.  Reasons to call and reschedule or cancel your  procedure: (Following these recommendations will minimize the risk of a serious complication.) . Surgeries: Avoid having procedures within 2 weeks of any surgery. (Avoid for 2 weeks before or after any surgery). . Flu Shots: Avoid having procedures within 2 weeks of a flu shots or . (Avoid for 2 weeks before or after immunizations). . Barium: Avoid having a procedure within 7-10 days after having had a radiological study involving the use of radiological contrast. (Myelograms, Barium swallow or enema study). . Heart attacks: Avoid any elective procedures or surgeries for the initial 6 months after a "Myocardial Infarction" (Heart Attack). . Blood thinners: It is imperative that you stop these medications before procedures. Let us know if you if you take any blood thinner.  . Infection: Avoid procedures during or within two weeks of an infection (including chest colds or gastrointestinal problems). Symptoms associated with infections include: Localized redness, fever, chills, night sweats or profuse sweating, burning sensation when voiding, cough, congestion, stuffiness, runny nose, sore throat, diarrhea, nausea, vomiting, cold or Flu symptoms, recent or current infections. It is specially important if the infection is over the area that we intend to treat. Marland Kitchen Heart and lung problems: Symptoms that may suggest an active cardiopulmonary problem include: cough, chest pain, breathing difficulties or shortness of breath, dizziness, ankle swelling, uncontrolled high or  unusually low blood pressure, and/or palpitations. If you are experiencing any of these symptoms, cancel your procedure and contact your primary care physician for an evaluation.  Remember:  Regular Business hours are:  Monday to Thursday 8:00 AM to 4:00 PM  Provider's Schedule: Milinda Pointer, MD:  Procedure days: Tuesday and Thursday 7:30 AM to 4:00 PM  [name], MD:  Procedure days: Monday and Wednesday 7:30 AM to 4:00  PM ____________________________________________________________________________________________

## 2019-12-29 NOTE — Progress Notes (Signed)
PROVIDER NOTE: Information contained herein reflects review and annotations entered in association with encounter. Interpretation of such information and data should be left to medically-trained personnel. Information provided to patient can be located elsewhere in the medical record under "Patient Instructions". Document created using STT-dictation technology, any transcriptional errors that may result from process are unintentional.    Patient: Mackenzie Bonilla  Service Category: E/M  Provider: [name], MD  DOB: Aug 26, 1965  DOS: 12/29/2019  Specialty: Interventional Pain Management  MRN: [id number]  Setting: Ambulatory outpatient  PCP: Abner Greenspan, MD  Type: Established Patient    Referring Provider: Abner Greenspan, MD  Location: Office  Delivery: Face-to-face     Primary Reason(s) for Visit: Encounter for evaluation before starting new chronic pain management plan of care (Level of risk: moderate) CC: Back Pain (low), Leg Pain (right), and Neck Pain  HPI  Mackenzie Bonilla is a 54 y.o. year old, female patient, who comes today for a follow-up evaluation to review the test results and decide on a treatment plan. She has Type 2 diabetes mellitus without complication, without long-term current use of insulin ([place]); PCOS (polycystic ovarian syndrome); Vitamin B 12 deficiency; VITAMIN D DEFICIENCY; Hyperlipidemia associated with type 2 diabetes mellitus ([place]); Morbid obesity ([place]); Generalized anxiety disorder; PANIC DISORDER; BULIMIA; Chronic depression; CARPAL TUNNEL SYNDROME; LYMPHEDEMA; Allergic rhinitis; Mild intermittent asthma; TMJ SYNDROME; GERD; IRRITABLE BOWEL SYNDROME; Menopausal symptoms; Osteoarthritis; CHRONIC FATIGUE SYNDROME; HYPERHIDROSIS; COUGH, CHRONIC; DIVERTICULITIS, HX OF; Hematuria; Uric acid kidney stone; Medication management; Great toe pain; Fall; Breast mass in female; Hypertriglyceridemia; Lumbar spine pain; Candidal intertrigo; Cystocele; Constipation; Urinary incontinence;  Mobility impaired; Diabetic polyneuropathy associated with type 2 diabetes mellitus ([place]); Diabetic angiopathy ([place]); Vasomotor symptoms due to menopause; Gallstones; Eating disorder; UTI (urinary tract infection); Multiple joint pain; Mid back pain; Encounter for medication review and counseling; Chronic bilateral thoracic back pain; Lumbar degenerative disc disease; Fibromyalgia; Chronic pain syndrome; Lumbar radicular pain (left S1); and Spondylosis without myelopathy or radiculopathy, lumbar region on their problem list. Her primarily concern today is the Back Pain (low), Leg Pain (right), and Neck Pain  Pain Assessment: Location: Lower Back Radiating: radiates down right leg to ankle Onset: More than a month ago Duration: Chronic pain Quality: Aching, Dull Severity: 7 /10 (subjective, self-reported pain score)  Effect on ADL: limits activities Timing: Constant Modifying factors: rest BP: 103/74  HR: 87  Mackenzie Bonilla comes in today for a follow-up visit after her initial evaluation on 12/08/2019. Today we went over the results of her tests. These were explained in "Layman's terms". During today's appointment we went over my diagnostic impression, as well as the proposed treatment plan.  From initial HPI, first patient visit 12/08/2019: Mackenzie Bonilla is a pleasant 54 year old female with multiple medical issues who presents with a chief complaint of mid thoracic and lumbar pain.  No inciting or traumatic event.  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  Of note she has been working with physical therapy for the last 3 months.  She has done TENS therapy.  She has been evaluated by Dr. Alba Destine with physical medicine and rehab but states that Dr. Alba Destine was not able to offer her anything for her pain.  Patient does have a vagal nerve stimulator in place for depression.  She states that this helps with her depression management.  She is unable to get an MRI because of this.  She has had  thoracic x-rays which showed thoracic degenerative disc disease and thoracic  facet joint syndrome.  She is on a multimodal analgesic regimen of Flexeril as needed, gabapentin 100 mg twice a day and 300 mg at bedtime as well as ibuprofen as needed.  She tried tramadol in the past which was not effective.  Patient's lumbar spine CT done approximately a year ago shows Focal disc protrusion to the left of midline at L5-S1 touching the left S1 nerve root sleeve.Severe right facet arthritis at L4-5.  Plan at that time was to focus on weight loss strategies, continue with physical therapy, repeat thoracic and lumbar spine CT was obtained, results below.  She was also prescribed magnesium for musculoskeletal cramps.  No significant change in her medical history.  Patient's thoracic CT unremarkable.  Lumbar spine CT shows severe right-sided facet arthrosis at L3-L4 and L4-L5 (R>L) which could likely be contributing to to the patient's severe low back pain.  Laboratory Chemistry Profile   Renal Lab Results  Component Value Date   BUN 15 11/12/2018   CREATININE 1.10 11/12/2018   BCR 10 05/21/2017   GFR 51.89 (L) 11/12/2018   GFRAA 72 05/21/2017   GFRNONAA 62 05/21/2017   SPECGRAV 1.015 04/14/2018   PHUR 6.0 04/14/2018   PROTEINUR Positive (A) 04/14/2018     Electrolytes Lab Results  Component Value Date   NA 140 11/12/2018   K 4.7 11/12/2018   CL 101 11/12/2018   CALCIUM 9.7 11/12/2018   PHOS 4.0 03/07/2009     Hepatic Lab Results  Component Value Date   AST 15 11/12/2018   ALT 19 11/12/2018   ALBUMIN 4.3 11/12/2018   ALKPHOS 70 11/12/2018   AMYLASE 34 09/05/2016   LIPASE 22.0 09/05/2016     ID Lab Results  Component Value Date   SARSCOV2NAA NEGATIVE 08/14/2019     Bone Lab Results  Component Value Date   VD25OH 26 (L) 11/14/2011   TESTOFREE 2.1 05/21/2017   TESTOSTERONE 12 05/21/2017     Endocrine Lab Results  Component Value Date   GLUCOSE 201 (H) 11/12/2018   GLUCOSEU  NEGATIVE 02/16/2017   HGBA1C 7.0 04/17/2017   TSH 1.80 11/12/2018   FREET4 0.72 06/29/2014   TESTOFREE 2.1 05/21/2017   TESTOSTERONE 12 05/21/2017   SHBG 55.9 05/21/2017     Neuropathy Lab Results  Component Value Date   VITAMINB12 182 (L) 11/12/2018   FOLATE 5.4 05/29/2007   HGBA1C 7.0 04/17/2017     CNS No results found for: COLORCSF, APPEARCSF, RBCCOUNTCSF, WBCCSF, POLYSCSF, LYMPHSCSF, EOSCSF, PROTEINCSF, GLUCCSF, JCVIRUS, CSFOLI, IGGCSF, LABACHR, ACETBL, LABACHR, ACETBL   Inflammation (CRP: Acute  ESR: Chronic) Lab Results  Component Value Date   ESRSEDRATE 31 (H) 11/12/2018   LATICACIDVEN 1.11 06/23/2015     Rheumatology Lab Results  Component Value Date   RF <14 11/12/2018   ANA NEGATIVE 11/12/2018   LABURIC 3.4 05/12/2013     Coagulation Lab Results  Component Value Date   PLT 229.0 11/12/2018     Cardiovascular Lab Results  Component Value Date   CKTOTAL 43 06/25/2007   CKMB 0.3 06/25/2007   TROPONINI <0.01        NO INDICATION OF MYOCARDIAL INJURY. 06/25/2007   HGB 14.8 11/12/2018   HCT 44.8 11/12/2018     Screening Lab Results  Component Value Date   SARSCOV2NAA NEGATIVE 08/14/2019     Cancer No results found for: CEA, CA125, LABCA2   Allergens No results found for: ALMOND, APPLE, ASPARAGUS, AVOCADO, BANANA, BARLEY, BASIL, BAYLEAF, GREENBEAN, LIMABEAN, WHITEBEAN, BEEFIGE, REDBEET, BLUEBERRY,  BROCCOLI, CABBAGE, MELON, CARROT, CASEIN, CASHEWNUT, CAULIFLOWER, CELERY     Note: Lab results reviewed.  Recent Diagnostic Imaging Review   DG Cervical Spine 2 or 3 views  Addendum 12/25/2018  8:59 AM ADDENDUM REPORT: 12/25/2018 08:57  ADDENDUM: Review of the information on the lead shows that the length of the lead in the left neck does not allow for MR scanning. Staff on site are aware. This was discussed with Cindy Hazy at [facility], via telephone, by me at approximately 0850 hours.   Electronically Signed By: Zetta Bills  M.D. On: 12/25/2018 08:57  Narrative CLINICAL DATA:  History of foreign body in soft tissue, clearance for MRI.  EXAM: CERVICAL SPINE - 2-3 VIEW  COMPARISON:  Prior imaging from 2018, a chest x-ray.  FINDINGS: Cervical spine degenerative changes greatest at C4-5 and C5-6.  Foreign body compatible with vagal nerve stimulator leads remain in the left neck. Lead length greater than 2 cm, approximately 10-12 cm remain in the left neck  No signs of acute finding in the visualized cervical spine, study acquired for evaluation of nerve stimulator.  IMPRESSION: Long segment of abdandoned vagal with nerve stimulator in the left neck, refer to lead-type and MRI safety reference to determine whether scan is possible.  Electronically Signed: By: Zetta Bills M.D. On: 12/25/2018 08:43 CT THORACIC SPINE W CONTRAST  Narrative CLINICAL DATA:  Mid back pain. Osteoarthritis. Rule out compression fracture.  EXAM: CT THORACIC SPINE WITHOUT CONTRAST  TECHNIQUE: Multidetector CT images of thoracic was performed according to the Bonilla protocol without intravenous contrast administration.  COMPARISON:  Thoracic spine radiographs 09/30/2019  FINDINGS: Alignment: Normal alignment.  Mild dextroscoliosis.  Vertebrae: Negative for fracture or mass.  Paraspinal and other soft tissues: Negative for paraspinous soft tissue mass. Visualized lungs are Mackenzie bilaterally. No pleural effusion.  Disc levels: Disc degeneration with anterior and right-sided spurring extending from T7 through T12 .  Small central calcified disc protrusion T5-6 unchanged without significant spinal stenosis.  No significant thoracic spinal or foraminal stenosis.  IMPRESSION: Negative for thoracic spinal stenosis. No compression fracture identified  Small central calcified disc protrusion at T5-6 without significant stenosis.   Electronically Signed By: Franchot Gallo M.D. On: 12/23/2019  11:22  Thoracic DG 2-3 views: Results for orders placed during the hospital encounter of 09/30/19  DG Thoracic Spine 2 View  Narrative CLINICAL DATA:  Dorsalgia  EXAM: THORACIC SPINE 3 VIEWS  COMPARISON:  None.  FINDINGS: Frontal, lateral, and swimmer's views were obtained. There is midthoracic dextroscoliosis. There is no fracture or spondylolisthesis. There is mild disc space narrowing at several levels. No erosive change or paraspinous lesions. There are anterior and right lateral osteophytes at several levels. No erosive change or paraspinous lesion. Visualized lungs Mackenzie.  IMPRESSION: Scoliosis. Disc space narrowing and osteophyte formation at several levels. No fracture or spondylolisthesis.   Electronically Signed By: Lowella Grip III M.D. On: 09/30/2019 11:35   Narrative CLINICAL DATA:  Low back pain.  Osteoarthritis.  EXAM: CT LUMBAR SPINE WITHOUT CONTRAST  TECHNIQUE: Multidetector CT imaging of the lumbar spine was performed without intravenous contrast administration. Multiplanar CT image reconstructions were also generated.  COMPARISON:  CT lumbar spine 12/29/2018  FINDINGS: Segmentation: Normal  Alignment: Normal  Vertebrae: Negative for fracture or mass.  Paraspinal and other soft tissues: Negative for paraspinous mass or adenopathy.  Disc levels: L1-2: Negative  L2-3: Mild disc bulging.  Negative for disc protrusion or stenosis.  L3-4: Mild disc bulging and mild  facet degeneration right greater than left. No significant stenosis.  L4-5: Mild disc bulging. Negative for disc protrusion or stenosis. Severe facet degeneration on the right and mild facet degeneration on the left.  L5-S1: Mild facet degeneration bilaterally.  Negative for stenosis.  IMPRESSION: Stable lumbar degenerative change without spinal or foraminal stenosis. No significant interval change.  Right-sided facet osteoarthritis L3-4 L4-5  unchanged.   Electronically Signed By: Franchot Gallo M.D. On: 12/23/2019 11:17     Narrative CLINICAL DATA:  Left buttock and leg pain and numbness for 1 month. No known injury.  EXAM: CT LUMBAR SPINE WITHOUT CONTRAST  TECHNIQUE: Multidetector CT imaging of the lumbar spine was performed without intravenous contrast administration. Multiplanar CT image reconstructions were also generated.  COMPARISON:  Lumbar radiographs dated 01/12/2015  FINDINGS: Segmentation: 5 lumbar type vertebrae.  Alignment: Normal.  Vertebrae: No fracture or bone destruction. Prominent right facet arthritis at L4-5.  Paraspinal and other soft tissues: Negative.  Disc levels: T11-12: Normal disc. Moderate right facet arthritis.  T12-L1: Disc. Minimal facet arthritis.  L1-2: Normal.  L2-3: Normal disc. Minimal right facet arthritis.  L3-4: Normal disc. Moderate right facet arthritis.  L4-5: There is slight increased density to the left of midline which I think is artifact. This does not persist on series 8. Severe right facet arthritis.  L5-S1: Focal disc protrusion to the left of midline touching the left S1 nerve root sleeve, best seen on image 24 of series 8.  IMPRESSION: 1. Focal disc protrusion to the left of midline at L5-S1 touching the left S1 nerve root sleeve. 2. Severe right facet arthritis at L4-5.   Electronically Signed By: Lorriane Shire M.D. On: 12/29/2018 10:14   Narrative CLINICAL DATA:  Fall.  Low back injury and pain.  Initial encounter.  EXAM: LUMBAR SPINE - 2-3 VIEW  COMPARISON:  None.  FINDINGS: There is no evidence of lumbar spine fracture. Alignment is normal. Intervertebral disc spaces are maintained. No other significant bone abnormality identified.  IMPRESSION: Negative lumbar spine radiographs.   Electronically Signed By: Earle Gell M.D. On: 01/12/2015 18:10 DG Knee 2 Views Right  Narrative *RADIOLOGY REPORT*  Clinical Data:  History of chronic knee pain.  No history of trauma.  RIGHT KNEE - 1-2 VIEW  Comparison: AP standing image of same date.  Findings: There is narrowing of the medial joint space.  Minimal degenerative spurring is seen.  No fracture bony destruction is evident.  No definite joint effusion is seen.  No chondrocalcinosis or loose body is seen.  IMPRESSION: Narrowing of the medial joint space with spurring.  Patellar spurring.  No fracture or bony destruction.  Original Report Authenticated By: Delane Ginger, M.D.  DG Knee Complete 4 Views Right  Narrative CLINICAL DATA:  Right knee pain status post fall  EXAM: RIGHT KNEE - COMPLETE 4+ VIEW  COMPARISON:  Right knee series of Jul 13, 2010  FINDINGS: The study is degraded due to scatter effects from the overlying soft tissues. The bones are adequately mineralized. There is narrowing of all 3 joint compartments. There is beaking of the tibial spines. There is a lateral tibial plateau osteophyte. There is no joint effusion. Mild soft tissue swelling anteriorly is present.  IMPRESSION: There is no acute bony abnormality. There are moderate degenerative changes.   Electronically Signed By: David  [place] On: 07/24/2013 08:38   Narrative CLINICAL DATA:  Fall, twisting right ankle injury, obesity  EXAM: RIGHT ANKLE - COMPLETE 3+ VIEW  COMPARISON:  None.  FINDINGS: Normal alignment without acute ankle fracture. Distal tibia, distal fibula, talus and calcaneus appear intact. Minor degenerative changes of the midfoot.  There is a nondisplaced acute fracture of the right fifth metatarsal base. No subluxation or dislocation.  IMPRESSION: Acute nondisplaced fracture right fifth metatarsal base.   Electronically Signed By: Jerilynn Mages.  Shick M.D. On: 01/12/2015 18:10  Ankle-L DG Complete: Results for orders placed in visit on 06/01/15  DG Ankle Complete Left  Narrative See clinic note     Complexity Note: Imaging results  reviewed. Results shared with Ms. Hardin Negus, using [employer].                         Meds   Current Outpatient Medications:  .  ACCU-CHEK SOFTCLIX LANCETS lancets, Use to check blood sugar  once a day, Disp: 100 each, Rfl: 0 .  acetaminophen (TYLENOL) 500 MG tablet, Take 500 mg by mouth every 6 (six) hours as needed. , Disp: , Rfl:  .  albuterol (VENTOLIN HFA) 108 (90 Base) MCG/ACT inhaler, Inhale 2 puffs into the lungs every 6 (six) hours as needed for wheezing., Disp: 18 g, Rfl: 3 .  ALPRAZolam (XANAX) 1 MG tablet, Take 2 mg by mouth 3 (three) times daily as needed. , Disp: , Rfl:  .  Blood Glucose Calibration (ACCU-CHEK AVIVA) SOLN, , Disp: , Rfl:  .  Blood Glucose Monitoring Suppl (FIFTY50 GLUCOSE METER 2.0) w/Device KIT, Use as directed, Disp: , Rfl:  .  cyclobenzaprine (FLEXERIL) 5 MG tablet, TAKE ONE TABLET 3 TIMES DAILY AS NEEDED FOR MUSCLE SPASM, Disp: 30 tablet, Rfl: 3 .  desvenlafaxine (PRISTIQ) 100 MG 24 hr tablet, , Disp: , Rfl:  .  dimenhyDRINATE (DRAMAMINE) 50 MG tablet, Take 50 mg by mouth every 8 (eight) hours as needed., Disp: , Rfl:  .  fluticasone (FLONASE) 50 MCG/ACT nasal spray, Place into both nostrils daily., Disp: , Rfl:  .  gabapentin (NEURONTIN) 100 MG capsule, Take 1 capsule (100 mg total) by mouth 2 (two) times daily. (Patient taking differently: Take 100 mg by mouth daily. ), Disp: 360 capsule, Rfl: 3 .  gabapentin (NEURONTIN) 300 MG capsule, Take 1 capsule (300 mg total) by mouth at bedtime., Disp: 90 capsule, Rfl: 3 .  glipiZIDE (GLUCOTROL XL) 10 MG 24 hr tablet, Take 1 tablet by mouth daily., Disp: , Rfl:  .  ibuprofen (ADVIL) 600 MG tablet, Take 1 tablet (600 mg total) by mouth every 8 (eight) hours as needed., Disp: 30 tablet, Rfl: 0 .  Insulin Degludec (TRESIBA [place]), Inject into the skin. 1 injection nightly 8-10 pm, Disp: , Rfl:  .  losartan (COZAAR) 25 MG tablet, , Disp: , Rfl:  .  Magnesium 500 MG CAPS, Take 1 capsule (500 mg total) by mouth at  bedtime., Disp: 180 capsule, Rfl: 0 .  nystatin cream (MYCOSTATIN), Apply 1 application topically 3 (three) times daily as needed (for yeast infection areas). To affected areas, Disp: 30 g, Rfl: 3 .  omeprazole (PRILOSEC) 20 MG capsule, TAKE 1 CAPSULE BY MOUTH TWICE DAILY, Disp: 180 capsule, Rfl: 1 .  oxybutynin (DITROPAN XL) 15 MG 24 hr tablet, TAKE 1 TABLET BY MOUTH DAILY, Disp: 90 tablet, Rfl: 1 .  Polyethyl Glycol-Propyl Glycol (SYSTANE OP), Apply 1 drop to eye daily as needed., Disp: , Rfl:  .  pravastatin (PRAVACHOL) 20 MG tablet, TAKE ONE TABLET AT BEDTIME, Disp: 30 tablet, Rfl: 2 .  Simethicone (GAS-X PO), Take  by mouth., Disp: , Rfl:  .  spironolactone (ALDACTONE) 100 MG tablet, TAKE ONE TABLET EVERY DAY, Disp: 30 tablet, Rfl: 2 .  traMADol (ULTRAM) 50 MG tablet, TAKE ONE TABLET TWICE A DAY IF NEEDED FOR SEVERE PAIN, Disp: 30 tablet, Rfl: 0 .  traZODone (DESYREL) 150 MG tablet, Take 300 mg by mouth at bedtime. , Disp: , Rfl:  .  TRULICITY 3 UU/7.2ZD SOPN, 4.5 mg. INJECT 4.5MG (0.5ML) SUBQ EVERY 7 DAY, Disp: , Rfl:  .  zinc oxide (BALMEX) 11.3 % CREA cream, Apply 1 application topically 2 (two) times daily., Disp: , Rfl:  .  ACCU-CHEK AVIVA PLUS test strip, Use to check blood sugar  once a day, Disp: 100 each, Rfl: 0 .  insulin glargine (LANTUS SOLOSTAR) 100 UNIT/ML Solostar Pen, 32 Units at bedtime.  (Patient not taking: Reported on 12/29/2019), Disp: , Rfl:   ROS  Constitutional: Denies any fever or chills Gastrointestinal: No reported hemesis, hematochezia, vomiting, or acute GI distress Musculoskeletal: Denies any acute onset joint swelling, redness, loss of ROM, or weakness Neurological: No reported episodes of acute onset apraxia, aphasia, dysarthria, agnosia, amnesia, paralysis, loss of coordination, or loss of consciousness  Allergies  Mackenzie Bonilla is allergic to cephalexin, codeine, duloxetine, levaquin [levofloxacin], lisinopril, metformin and related, quetiapine, sertraline  hcl, triazolam, zaleplon, zocor [simvastatin - high dose], zolpidem tartrate, hydrocodone, and norelgestromin-eth estradiol.  Mackenzie Bonilla  Drug: Mackenzie Bonilla  reports no history of drug use. Alcohol:  reports no history of alcohol use. Tobacco:  reports that she has never smoked. She has never used smokeless tobacco. Medical:  has a past medical history of Allergy, Anemia, Anxiety, Arthritis, Asthma, Claustrophobia, Depression, Diabetes mellitus without complication ([place]), Fall (2016), Fatty liver (07/2008), Fibromyalgia, GERD (gastroesophageal reflux disease), High uric acid in 24 hour urine specimen, Hyperhidrosis, Hyperlipidemia, Kidney stones, Lymphedema, Obesity, PCOS (polycystic ovarian syndrome), Shortness of breath dyspnea, Tachycardia, TMJ (dislocation of temporomandibular joint), and UTI (lower urinary tract infection). 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 Family: family history includes Cancer in her father; Diabetes in her maternal grandmother and paternal grandmother; Heart disease (age of onset: 63) in her maternal grandmother; Heart disease (age of onset: 67) in her paternal grandmother; Hypertension in her father and sister.  Constitutional Exam  General appearance: Well nourished, well developed, and well hydrated. In no apparent acute distress Vitals:   12/29/19 0835  BP: 103/74  Pulse: 87  Resp: 18  Temp: (!) 97.3 F (36.3 C)  SpO2: 97%  Weight: (!) 333 lb (151 kg)  Height: 5' 4"  (1.626 m)   BMI Assessment: Estimated body mass index is 57.16 kg/m as calculated from the  following:   Height as of this encounter: 5' 4"  (1.626 m).   Weight as of this encounter: 333 lb (151 kg).  BMI interpretation table: BMI level Category Range association with higher incidence of chronic pain  <18 kg/m2 Underweight   18.5-24.9 kg/m2 Ideal body weight   25-29.9 kg/m2 Overweight Increased incidence by 20%  30-34.9 kg/m2 Obese (Class I) Increased incidence by 68%  35-39.9 kg/m2 Severe obesity (Class II) Increased incidence by 136%  >40 kg/m2 Extreme obesity (Class III) Increased incidence by 254%   Patient's current BMI Ideal Body weight  Body  mass index is 57.16 kg/m. Ideal body weight: 54.7 kg (120 lb 9.5 oz) Adjusted ideal body weight: 93.2 kg (205 lb 8.9 oz)   BMI Readings from Last 4 Encounters:  12/29/19 57.16 kg/m  12/08/19 57.16 kg/m  11/04/19 56.82 kg/m  10/06/19 56.83 kg/m   Wt Readings from Last 4 Encounters:  12/29/19 (!) 333 lb (151 kg)  12/08/19 (!) 333 lb (151 kg)  11/04/19 (!) 331 lb (150.1 kg)  10/06/19 (!) 331 lb 1 oz (150.2 kg)    Psych/Mental status: Alert, oriented x 3 (person, place, & time)       Eyes: PERLA Respiratory: No evidence of acute respiratory distress    Cervical Spine Exam  Skin & Axial Inspection: No masses, redness, edema, swelling, or associated skin lesions Alignment: Symmetrical Functional ROM: Unrestricted ROM      Stability: No instability detected Muscle Tone/Strength: Functionally intact. No obvious neuro-muscular anomalies detected. Sensory (Neurological): Unimpaired Palpation: No palpable anomalies                    Upper Extremity (UE) Exam    Side: Right upper extremity  Side: Left upper extremity   Skin & Extremity Inspection: Skin color, temperature, and hair growth are WNL. No peripheral edema or cyanosis. No masses, redness, swelling, asymmetry, or associated skin lesions. No contractures.  Skin & Extremity Inspection: Skin color, temperature, and hair growth are WNL. No peripheral edema or  cyanosis. No masses, redness, swelling, asymmetry, or associated skin lesions. No contractures.   Functional ROM: Unrestricted ROM          Functional ROM: Unrestricted ROM           Muscle Tone/Strength: Functionally intact. No obvious neuro-muscular anomalies detected.   Muscle Tone/Strength: Functionally intact. No obvious neuro-muscular anomalies detected.   Sensory (Neurological): Unimpaired          Sensory (Neurological): Unimpaired           Palpation: No palpable anomalies              Palpation: No palpable anomalies               Provocative Test(s):  Phalen's test: deferred Tinel's test: deferred Apley's scratch test (touch opposite shoulder):  Action 1 (Across chest): deferred Action 2 (Overhead): deferred Action 3 (LB reach): deferred   Provocative Test(s):  Phalen's test: deferred Tinel's test: deferred Apley's scratch test (touch opposite shoulder):  Action 1 (Across chest): deferred Action 2 (Overhead): deferred Action 3 (LB reach): deferred     Thoracic Spine Area Exam  Skin & Axial Inspection: No masses, redness, or swelling Alignment: Symmetrical Functional ROM: Pain restricted ROM Stability: No instability detected Muscle Tone/Strength: Functionally intact. No obvious neuro-muscular anomalies detected. Sensory (Neurological): Musculoskeletal pain pattern Muscle strength & Tone: No palpable anomalies  Lumbar Exam  Skin & Axial Inspection: No masses, redness, or swelling Alignment: Symmetrical Functional ROM: Pain restricted ROM       Stability: No instability detected Muscle Tone/Strength: Functionally intact. No obvious neuro-muscular anomalies detected. Sensory (Neurological): Musculoskeletal pain pattern Palpation: No palpable anomalies       Provocative Tests: Hyperextension/rotation test: (+) bilaterally for facet joint pain. Lumbar quadrant test (Kemp's test): (+) bilaterally for facet joint pain. Lateral bending test: deferred today        Patrick's Maneuver: deferred today                   FABER* test: deferred today  S-I anterior distraction/compression test: deferred today         S-I lateral compression test: deferred today         S-I Thigh-thrust test: deferred today         S-I Gaenslen's test: deferred today         *(Flexion, ABduction and External Rotation)  Gait & Posture Assessment  Ambulation: Limited Gait: Modified gait pattern (slower gait speed, wider stride width, and longer stance duration) associated with morbid obesity Posture: Difficulty standing up straight, due to pain   Lower Extremity Exam    Side: Right lower extremity  Side: Left lower extremity  Stability: No instability observed          Stability: No instability observed          Skin & Extremity Inspection: Skin color, temperature, and hair growth are WNL. No peripheral edema or cyanosis. No masses, redness, swelling, asymmetry, or associated skin lesions. No contractures.  Skin & Extremity Inspection: Skin color, temperature, and hair growth are WNL. No peripheral edema or cyanosis. No masses, redness, swelling, asymmetry, or associated skin lesions. No contractures.  Functional ROM: Pain restricted ROM for hip and knee joints Limited SLR (straight leg raise)  Functional ROM: Pain restricted ROM for hip and knee joints Limited SLR (straight leg raise)  Muscle Tone/Strength: Functionally intact. No obvious neuro-muscular anomalies detected.  Muscle Tone/Strength: Functionally intact. No obvious neuro-muscular anomalies detected.  Sensory (Neurological): Arthropathic arthralgia        Sensory (Neurological): Arthropathic arthralgia        DTR: Patellar: deferred today Achilles: deferred today Plantar: deferred today  DTR: Patellar: deferred today Achilles: deferred today Plantar: deferred today  Palpation: No palpable anomalies  Palpation: No palpable anomalies     Assessment & Plan  Primary Diagnosis &  Pertinent Problem List: The primary encounter diagnosis was Lumbar facet arthropathy (severe right L4-L5). Diagnoses of Spondylosis without myelopathy or radiculopathy, lumbar region, Lumbar degenerative disc disease, Lumbar spine pain, Morbid obesity ([place]), and Chronic pain syndrome were also pertinent to this visit.  Visit Diagnosis: 1. Lumbar facet arthropathy (severe right L4-L5)   2. Spondylosis without myelopathy or radiculopathy, lumbar region   3. Lumbar degenerative disc disease   4. Lumbar spine pain   5. Morbid obesity ([place])   6. Chronic pain syndrome    Problems updated and reviewed during this visit: Problem  Spondylosis Without Myelopathy Or Radiculopathy, Lumbar Region  Lumbar Degenerative Disc Disease   Mackenzie Bonilla has a history of greater than 3 months of moderate to severe pain which is resulted in functional impairment.  The patient has tried various conservative therapeutic options such as NSAIDs, Tylenol, muscle relaxants, physical therapy which was inadequately effective.  Patient's pain is predominantly axial with physical exam and CT-Lumbar spine findings suggestive of facet arthropathy. Lumbar facet medial branch nerve blocks were discussed with the patient.  Risks and benefits were reviewed.  Patient would like to proceed with bilateral L3, L4, L5 medial branch nerve block.   Plan of Care   Procedure Orders     LUMBAR FACET(MEDIAL BRANCH NERVE BLOCK) MBNB  Interventional management options:  Considering:   [facility] PNS SI-J   PRN Procedures:   None at this time    Provider-requested follow-up: Return in about 1 week (around 01/05/2020) for B/L L3, 4, 5 Fcts , with sedation. Recent Visits Date Type Provider Dept  12/08/19 Office Visit [name], MD [facility]  Showing recent visits within  past 90 days and meeting all other requirements Today's Visits Date Type Provider Dept  12/29/19 Office Visit [name], MD [facility]  Showing today's visits and meeting all other requirements Future Appointments No visits were found meeting these conditions. Showing future appointments within next 90 days and meeting all other requirements  Primary Care Physician: Tower, Wynelle Fanny, MD Note by: [name], MD Date: 12/29/2019; Time: 9:34 AM

## 2019-12-29 NOTE — Progress Notes (Signed)
Safety precautions to be maintained throughout the outpatient stay will include: orient to surroundings, keep bed in low position, maintain call bell within reach at all times, provide assistance with transfer out of bed and ambulation.  

## 2019-12-31 ENCOUNTER — Telehealth: Payer: Self-pay | Admitting: Student in an Organized Health Care Education/Training Program

## 2019-12-31 NOTE — Telephone Encounter (Signed)
Patient lvmail asking about her procedure on Monday 11-22 and what meds she needs to stop taking. Please advise patient what she needs to do

## 2019-12-31 NOTE — Telephone Encounter (Signed)
Patient called and I thoroughly went over all pre-procedure instructions. Instructed her to call for any other questions or concerns.

## 2020-01-02 ENCOUNTER — Other Ambulatory Visit: Payer: Self-pay | Admitting: Family Medicine

## 2020-01-04 ENCOUNTER — Ambulatory Visit (HOSPITAL_BASED_OUTPATIENT_CLINIC_OR_DEPARTMENT_OTHER): Payer: Medicare PPO | Admitting: Student in an Organized Health Care Education/Training Program

## 2020-01-04 ENCOUNTER — Encounter: Payer: Self-pay | Admitting: Student in an Organized Health Care Education/Training Program

## 2020-01-04 ENCOUNTER — Other Ambulatory Visit: Payer: Self-pay

## 2020-01-04 ENCOUNTER — Ambulatory Visit
Admission: RE | Admit: 2020-01-04 | Discharge: 2020-01-04 | Disposition: A | Discharge status: Home / Self Care | Payer: Medicare PPO | Source: Ambulatory Visit | Attending: Student in an Organized Health Care Education/Training Program | Admitting: Student in an Organized Health Care Education/Training Program

## 2020-01-04 DIAGNOSIS — G894 Chronic pain syndrome: Secondary | ICD-10-CM | POA: Diagnosis not present

## 2020-01-04 DIAGNOSIS — M47816 Spondylosis without myelopathy or radiculopathy, lumbar region: Secondary | ICD-10-CM

## 2020-01-04 MED ORDER — DEXAMETHASONE SODIUM PHOSPHATE 10 MG/ML IJ SOLN
INTRAMUSCULAR | Status: AC
  Filled 2020-01-04: qty 1

## 2020-01-04 MED ORDER — LIDOCAINE HCL 2 % IJ SOLN
INTRAMUSCULAR | Status: AC
  Filled 2020-01-04: qty 20

## 2020-01-04 MED ORDER — LIDOCAINE HCL 2 % IJ SOLN
20.0000 mL | Freq: Once | INTRAMUSCULAR | Status: AC
  Administered 2020-01-04: 400 mg

## 2020-01-04 MED ORDER — ROPIVACAINE HCL 2 MG/ML IJ SOLN
INTRAMUSCULAR | Status: AC
  Filled 2020-01-04: qty 20

## 2020-01-04 MED ORDER — DEXAMETHASONE SODIUM PHOSPHATE 10 MG/ML IJ SOLN
INTRAMUSCULAR | Status: AC
  Filled 2020-01-04: qty 2

## 2020-01-04 MED ORDER — ROPIVACAINE HCL 2 MG/ML IJ SOLN
9.0000 mL | Freq: Once | INTRAMUSCULAR | Status: AC
  Administered 2020-01-04: 9 mL via PERINEURAL

## 2020-01-04 MED ORDER — DEXAMETHASONE SODIUM PHOSPHATE 10 MG/ML IJ SOLN
10.0000 mg | Freq: Once | INTRAMUSCULAR | Status: AC
  Administered 2020-01-04: 10 mg

## 2020-01-04 MED ORDER — DEXAMETHASONE SODIUM PHOSPHATE 10 MG/ML IJ SOLN
20.0000 mg | Freq: Once | INTRAMUSCULAR | Status: AC
  Administered 2020-01-04: 10 mg

## 2020-01-04 NOTE — Progress Notes (Signed)
Safety precautions to be maintained throughout the outpatient stay will include: orient to surroundings, keep bed in low position, maintain call bell within reach at all times, provide assistance with transfer out of bed and ambulation.  

## 2020-01-04 NOTE — Progress Notes (Signed)
PROVIDER NOTE: Information contained herein reflects review and annotations entered in association with encounter. Interpretation of such information and data should be left to medically-trained personnel. Information provided to patient can be located elsewhere in the medical record under "Patient Instructions". Document created using STT-dictation technology, any transcriptional errors that may result from process are unintentional.    Patient: Mackenzie Bonilla  Service Category: Procedure  Provider: [name], MD  DOB: [birth year]  DOS: 01/04/2020  Location: [facility]  MRN: [id number]  Setting: Ambulatory - outpatient  Referring Provider: [name], MD  Type: Established Patient  Specialty: Interventional Pain Management  PCP: Abner Greenspan, MD   Primary Reason for Visit: Interventional Pain Management Treatment. CC: Back Pain and Neck Pain  Procedure:          Anesthesia, Analgesia, Anxiolysis:  Type: Lumbar Facet, Medial Branch Block(s) #1  Primary Purpose: Diagnostic Region: Posterolateral Lumbosacral Spine Level:L3, L4, L5,  Medial Branch Level(s). Injecting these levels blocks the L3-4, L4-5, lumbar facet joints. Laterality: Bilateral  Type: Local Anesthesia  Local Anesthetic: Lidocaine 1-2%  Position: Prone   Indications: 1. Lumbar facet arthropathy (severe right L4-L5)   2. Chronic pain syndrome   3. Spondylosis without myelopathy or radiculopathy, lumbar region    Pain Score: Pre-procedure: 8 /10 Post-procedure: 0-No pain/10   Pre-op H&P Assessment:  Mackenzie Bonilla is a 54 y.o. (year old), female patient, seen today for interventional treatment. 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 Mackenzie Bonilla has a current medication list which includes the following prescription(s): accu-chek aviva plus, accu-chek softclix lancets, acetaminophen, albuterol, alprazolam, accu-chek aviva, fifty50 glucose meter 2.0, cyclobenzaprine, desvenlafaxine, dimenhydrinate, fluticasone, gabapentin, gabapentin, ibuprofen, insulin degludec, losartan, magnesium, nystatin cream, omeprazole, oxybutynin, polyethyl glycol-propyl glycol, pravastatin, simethicone, spironolactone, tramadol, trazodone, trulicity, zinc oxide, glipizide, and lantus solostar. Her primarily concern today is the Back Pain and Neck Pain  Initial Vital Signs:  Pulse/HCG Rate: 80  Temp: (!) 97 F (36.1 C) Resp: 18 BP: 114/81 SpO2: 94 %  BMI: Estimated body mass index is 57.16 kg/m as calculated from the following:   Height as of this encounter: 5\' 4"  (1.626 m).   Weight as of this encounter: 333 lb (151 kg).  Risk Assessment: Allergies: Reviewed. She is allergic to cephalexin, codeine, duloxetine, levaquin [levofloxacin], lisinopril, metformin and related, quetiapine, sertraline hcl, triazolam, zaleplon, zocor [simvastatin - high dose], zolpidem tartrate, hydrocodone, and norelgestromin-eth estradiol.  Allergy Precautions: None required Coagulopathies: Reviewed. None identified.  Blood-thinner therapy: None at this time Active Infection(s): Reviewed. None identified. Ms. Winquist is afebrile  Site Confirmation: Mackenzie Bonilla was asked to confirm the procedure and laterality before marking the site Procedure checklist: Completed Consent: Before the procedure and under the influence of no sedative(s), amnesic(s), or anxiolytics, the patient was informed of the treatment options, risks and possible complications. To fulfill our ethical and legal obligations, as recommended by the American Medical  Association's Code of Ethics, I have informed the patient of my clinical impression; the nature and purpose of the treatment or procedure; the risks, benefits, and possible complications of the intervention; the alternatives, including doing nothing; the risk(s) and benefit(s) of the alternative treatment(s) or procedure(s); and the risk(s) and benefit(s) of doing nothing. The patient was  provided information about the general risks and possible complications associated with the procedure. These may include, but are not limited to: failure to achieve desired goals, infection, bleeding, organ or nerve damage, allergic reactions, paralysis, and death. In addition, the patient was informed of those risks and complications associated to Spine-related procedures, such as failure to decrease pain; infection (i.e.: Meningitis, epidural or intraspinal abscess); bleeding (i.e.: epidural hematoma, subarachnoid hemorrhage, or any other type of intraspinal or peri-dural bleeding); organ or nerve damage (i.e.: Any type of peripheral nerve, nerve root, or spinal cord injury) with subsequent damage to sensory, motor, and/or autonomic systems, resulting in permanent pain, numbness, and/or weakness of one or several areas of the body; allergic reactions; (i.e.: anaphylactic reaction); and/or death. Furthermore, the patient was informed of those risks and complications associated with the medications. These include, but are not limited to: allergic reactions (i.e.: anaphylactic or anaphylactoid reaction(s)); adrenal axis suppression; blood sugar elevation that in diabetics may result in ketoacidosis or comma; water retention that in patients with history of congestive heart failure may result in shortness of breath, pulmonary edema, and decompensation with resultant heart failure; weight gain; swelling or edema; medication-induced neural toxicity; particulate matter embolism and blood vessel occlusion with resultant organ, and/or  nervous system infarction; and/or aseptic necrosis of one or more joints. Finally, the patient was informed that Medicine is not an exact science; therefore, there is also the possibility of unforeseen or unpredictable risks and/or possible complications that may result in a catastrophic outcome. The patient indicated having understood very clearly. We have given the patient no guarantees and we have made no promises. Enough time was given to the patient to ask questions, all of which were answered to the patient's satisfaction. Ms. Hairston has indicated that she wanted to continue with the procedure. Attestation: I, the ordering provider, attest that I have discussed with the patient the benefits, risks, side-effects, alternatives, likelihood of achieving goals, and potential problems during recovery for the procedure that I have provided informed consent. Date  Time: 01/04/2020 10:32 AM  Pre-Procedure Preparation:  Monitoring: As per clinic protocol. Respiration, ETCO2, SpO2, BP, heart rate and rhythm monitor placed and checked for adequate function Safety Precautions: Patient was assessed for positional comfort and pressure points before starting the procedure. Time-out: I initiated and conducted the "Time-out" before starting the procedure, as per protocol. The patient was asked to participate by confirming the accuracy of the "Time Out" information. Verification of the correct person, site, and procedure were performed and confirmed by me, the nursing staff, and the patient. "Time-out" conducted as per Joint Commission's Universal Protocol (UP.01.01.01). Time: 1115  Description of Procedure:          Laterality: Bilateral. The procedure was performed in identical fashion on both sides. Levels:  L3, L4, L5,  Medial Branch Level(s) Area Prepped: Posterior Lumbosacral Region DuraPrep (Iodine Povacrylex [0.7% available iodine] and Isopropyl Alcohol, 74% w/w) Safety Precautions: Aspiration looking  for blood return was conducted prior to all injections. At no point did we inject any substances, as a needle was being advanced. Before injecting, the patient was told to immediately notify me if she was experiencing any new onset of "ringing in the ears, or metallic taste in the mouth". No attempts were made at seeking any paresthesias. Safe injection practices and needle disposal techniques used. Medications properly checked for expiration dates. SDV (single dose vial) medications used. After the completion of the procedure, all disposable equipment used was discarded in the proper  designated Insurance risk surveyor. Local Anesthesia: Protocol guidelines were followed. The patient was positioned over the fluoroscopy table. The area was prepped in the usual manner. The time-out was completed. The target area was identified using fluoroscopy. A 12-in long, straight, sterile hemostat was used with fluoroscopic guidance to locate the targets for each level blocked. Once located, the skin was marked with an approved surgical skin marker. Once all sites were marked, the skin (epidermis, dermis, and hypodermis), as well as deeper tissues (fat, connective tissue and muscle) were infiltrated with a small amount of a short-acting local anesthetic, loaded on a 10cc syringe with a 25G, 1.5-in  Needle. An appropriate amount of time was allowed for local anesthetics to take effect before proceeding to the next step. Local Anesthetic: Lidocaine 2.0% The unused portion of the local anesthetic was discarded in the proper designated containers. Technical explanation of process:  L3 Medial Branch Nerve Block (MBB): The target area for the L3 medial branch is at the junction of the postero-lateral aspect of the superior articular process and the superior, posterior, and medial edge of the transverse process of L4. Under fluoroscopic guidance, a Quincke needle was inserted until contact was made with os over the superior  postero-lateral aspect of the pedicular shadow (target area). After negative aspiration for blood,1.5  mL of the nerve block solution was injected without difficulty or complication. The needle was removed intact. L4 Medial Branch Nerve Block (MBB): The target area for the L4 medial branch is at the junction of the postero-lateral aspect of the superior articular process and the superior, posterior, and medial edge of the transverse process of L5. Under fluoroscopic guidance, a Quincke needle was inserted until contact was made with os over the superior postero-lateral aspect of the pedicular shadow (target area). After negative aspiration for blood,1.59mL of the nerve block solution was injected without difficulty or complication. The needle was removed intact. L5 Medial Branch Nerve Block (MBB): The target area for the L5 medial branch is at the junction of the postero-lateral aspect of the superior articular process and the superior, posterior, and medial edge of the sacral ala. Under fluoroscopic guidance, a Quincke needle was inserted until contact was made with os over the superior postero-lateral aspect of the pedicular shadow (target area). After negative aspiration for blood, 1.43mL of the nerve block solution was injected without difficulty or complication. The needle was removed intact.  Nerve block solution: 10 cc solution made of 8 cc of 0.2% ropivacaine, 2 cc of Decadron 10 mg/cc.  1.5 cc injected at each level above bilaterally.  The unused portion of the solution was discarded in the proper designated containers. Procedural Needles: 22-gauge, 3.5-inch, Quincke needles used for all levels.  Once the entire procedure was completed, the treated area was cleaned, making sure to leave some of the prepping solution back to take advantage of its long term bactericidal properties.   Illustration of the posterior view of the lumbar spine and the posterior neural structures. Laminae of L2 through S1  are labeled. DPRL5, dorsal primary ramus of L5; DPRS1, dorsal primary ramus of S1; DPR3, dorsal primary ramus of L3; FJ, facet (zygapophyseal) joint L3-L4; I, inferior articular process of L4; LB1, lateral branch of dorsal primary ramus of L1; IAB, inferior articular branches from L3 medial branch (supplies L4-L5 facet joint); IBP, intermediate branch plexus; MB3, medial branch of dorsal primary ramus of L3; NR3, third lumbar nerve root; S, superior articular process of L5; SAB, superior articular branches from  L4 (supplies L4-5 facet joint also); TP3, transverse process of L3.  Vitals:   01/04/20 1036 01/04/20 1110 01/04/20 1120 01/04/20 1130  BP: 114/81 (!) 141/74 (!) 140/91 (!) 142/90  Pulse: 80 73 71 70  Resp: 18 15 16 15   Temp: (!) 97 F (36.1 C)     TempSrc: Temporal     SpO2: 94% 95% 93% 95%  Weight: (!) 333 lb (151 kg)     Height: 5\' 4"  (1.626 m)        Start Time: 1115 hrs. End Time: 1124 hrs.  Imaging Guidance (Spinal):          Type of Imaging Technique: Fluoroscopy Guidance (Spinal) Indication(s): Assistance in needle guidance and placement for procedures requiring needle placement in or near specific anatomical locations not easily accessible without such assistance. Exposure Time: Please see nurses notes. Contrast: None used. Fluoroscopic Guidance: I was personally present during the use of fluoroscopy. "Tunnel Vision Technique" used to obtain the best possible view of the target area. Parallax error corrected before commencing the procedure. "Direction-depth-direction" technique used to introduce the needle under continuous pulsed fluoroscopy. Once target was reached, antero-posterior, oblique, and lateral fluoroscopic projection used confirm needle placement in all planes. Images permanently stored in EMR. Interpretation: No contrast injected. I personally interpreted the imaging intraoperatively. Adequate needle placement confirmed in multiple planes. Permanent images saved  into the patient's record.  Antibiotic Prophylaxis:   Anti-infectives (From admission, onward)   None     Indication(s): None identified  Post-operative Assessment:  Post-procedure Vital Signs:  Pulse/HCG Rate: 70  Temp: (!) 97 F (36.1 C) Resp: 15 BP: (!) 142/90 SpO2: 95 %  EBL: None  Complications: No immediate post-treatment complications observed by team, or reported by patient.  Note: The patient tolerated the entire procedure well. A repeat set of vitals were taken after the procedure and the patient was kept under observation following institutional policy, for this type of procedure. Post-procedural neurological assessment was performed, showing return to baseline, prior to discharge. The patient was provided with post-procedure discharge instructions, including a section on how to identify potential problems. Should any problems arise concerning this procedure, the patient was given instructions to immediately contact us, at any time, without hesitation. In any case, we plan to contact the patient by telephone for a follow-up status report regarding this interventional procedure.  Comments:  No additional relevant information.  5 out of 5 strength bilateral lower extremity: Plantar flexion, dorsiflexion, knee flexion, knee extension.   Plan of Care  Orders:  Orders Placed This Encounter  Procedures  . DG PAIN [facility] C-ARM 1-60 MIN NO REPORT    Intraoperative interpretation by procedural physician at [place].    Standing Status:   Standing    Number of Occurrences:   1    Order Specific Question:   Reason for exam:    Answer:   Assistance in needle guidance and placement for procedures requiring needle placement in or near specific anatomical locations not easily accessible without such assistance.    Medications ordered for procedure: Meds ordered this encounter  Medications  . lidocaine (XYLOCAINE) 2 % (with pres) injection 400 mg  . ropivacaine  (PF) 2 mg/mL (0.2%) (NAROPIN) injection 9 mL  . ropivacaine (PF) 2 mg/mL (0.2%) (NAROPIN) injection 9 mL  . dexamethasone (DECADRON) injection 20 mg  . dexamethasone (DECADRON) injection 10 mg   Medications administered: We administered lidocaine, ropivacaine (PF) 2 mg/mL (0.2%), ropivacaine (PF) 2 mg/mL (0.2%), dexamethasone,  and dexamethasone.  See the medical record for exact dosing, route, and time of administration.  Follow-up plan:   Return in about 4 weeks (around 02/01/2020) for Post Procedure Evaluation, virtual.      Status post bilateral L3, L4, L5 facet medial branch nerve blocks 01/04/2020.   Recent Visits Date Type Provider Dept  12/29/19 Office Visit [name], MD [facility]  12/08/19 Office Visit [name], MD [facility]  Showing recent visits within past 90 days and meeting all other requirements Today's Visits Date Type Provider Dept  01/04/20 Procedure visit [name], MD [facility]  Showing today's visits and meeting all other requirements Future Appointments Date Type Provider Dept  02/01/20 Appointment [name], MD [facility]  Showing future appointments within next 90 days and meeting all other requirements  Disposition: Discharge home  Discharge (Date  Time): 01/04/2020;   hrs.   Primary Care Physician: Tower, Wynelle Fanny, MD Location: [facility] Note by: [name], MD Date: 01/04/2020; Time: 11:50 AM  Disclaimer:  Medicine is not an exact science. The only guarantee in medicine is that nothing is guaranteed. It is important to note that the decision to proceed with this intervention was based on the information collected from the patient. The Data and conclusions were drawn from the patient's questionnaire, the interview, and the physical examination. Because the information was provided in large part by the patient, it cannot be guaranteed that it has not been  purposely or unconsciously manipulated. Every effort has been made to obtain as much relevant data as possible for this evaluation. It is important to note that the conclusions that lead to this procedure are derived in large part from the available data. Always take into account that the treatment will also be dependent on availability of resources and existing treatment guidelines, considered by other Pain Management Practitioners as being common knowledge and practice, at the time of the intervention. For Medico-Legal purposes, it is also important to point out that variation in procedural techniques and pharmacological choices are the acceptable norm. The indications, contraindications, technique, and results of the above procedure should only be interpreted and judged by a Board-Certified Interventional Pain Specialist with extensive familiarity and expertise in the same exact procedure and technique.

## 2020-01-04 NOTE — Patient Instructions (Signed)
____________________________________________________________________________________________  Post-Procedure Discharge Instructions  Instructions:  Apply ice:   Purpose: This will minimize any swelling and discomfort after procedure.   When: Day of procedure, as soon as you get home.  How: Fill a plastic sandwich bag with crushed ice. Cover it with a small towel and apply to injection site.  How long: (15 min on, 15 min off) Apply for 15 minutes then remove x 15 minutes.  Repeat sequence on day of procedure, until you go to bed.  Apply heat:   Purpose: To treat any soreness and discomfort from the procedure.  When: Starting the next day after the procedure.  How: Apply heat to procedure site starting the day following the procedure.  How long: May continue to repeat daily, until discomfort goes away.  Food intake: Start with clear liquids (like water) and advance to regular food, as tolerated.   Physical activities: Keep activities to a minimum for the first 8 hours after the procedure. After that, then as tolerated.  Driving: If you have received any sedation, be responsible and do not drive. You are not allowed to drive for 24 hours after having sedation.  Blood thinner: (Applies only to those taking blood thinners) You may restart your blood thinner 6 hours after your procedure.  Insulin: (Applies only to Diabetic patients taking insulin) As soon as you can eat, you may resume your normal dosing schedule.  Infection prevention: Keep procedure site clean and dry. Shower daily and clean area with soap and water.  Post-procedure Pain Diary: Extremely important that this be done correctly and accurately. Recorded information will be used to determine the next step in treatment. For the purpose of accuracy, follow these rules:  Evaluate only the area treated. Do not report or include pain from an untreated area. For the purpose of this evaluation, ignore all other areas of pain,  except for the treated area.  After your procedure, avoid taking a long nap and attempting to complete the pain diary after you wake up. Instead, set your alarm clock to go off every hour, on the hour, for the initial 8 hours after the procedure. Document the duration of the numbing medicine, and the relief you are getting from it.  Do not go to sleep and attempt to complete it later. It will not be accurate. If you received sedation, it is likely that you were given a medication that may cause amnesia. Because of this, completing the diary at a later time may cause the information to be inaccurate. This information is needed to plan your care.  Follow-up appointment: Keep your post-procedure follow-up evaluation appointment after the procedure (usually 2 weeks for most procedures, 6 weeks for radiofrequencies). DO NOT FORGET to bring you pain diary with you.   Expect: (What should I expect to see with my procedure?)  From numbing medicine (AKA: Local Anesthetics): Numbness or decrease in pain. You may also experience some weakness, which if present, could last for the duration of the local anesthetic.  Onset: Full effect within 15 minutes of injected.  Duration: It will depend on the type of local anesthetic used. On the average, 1 to 8 hours.   From steroids (Applies only if steroids were used): Decrease in swelling or inflammation. Once inflammation is improved, relief of the pain will follow.  Onset of benefits: Depends on the amount of swelling present. The more swelling, the longer it will take for the benefits to be seen. In some cases, up to 10 days.    Duration: Steroids will stay in the system x 2 weeks. Duration of benefits will depend on multiple posibilities including persistent irritating factors.  Side-effects: If present, they may typically last 2 weeks (the duration of the steroids).  Frequent: Cramps (if they occur, drink Gatorade and take over-the-counter Magnesium 450-500 mg  once to twice a day); water retention with temporary weight gain; increases in blood sugar; decreased immune system response; increased appetite.  Occasional: Facial flushing (red, warm cheeks); mood swings; menstrual changes.  Uncommon: Long-term decrease or suppression of natural hormones; bone thinning. (These are more common with higher doses or more frequent use. This is why we prefer that our patients avoid having any injection therapies in other practices.)   Very Rare: Severe mood changes; psychosis; aseptic necrosis.  From procedure: Some discomfort is to be expected once the numbing medicine wears off. This should be minimal if ice and heat are applied as instructed.  Call if: (When should I call?)  You experience numbness and weakness that gets worse with time, as opposed to wearing off.  New onset bowel or bladder incontinence. (Applies only to procedures done in the spine)  Emergency Numbers:  Durning business hours (Monday - Thursday, 8:00 AM - 4:00 PM) (Friday, 9:00 AM - 12:00 Noon): [phone]  After hours: [phone]  NOTE: If you are having a problem and are unable connect with, or to talk to a provider, then go to your nearest urgent care or emergency department. If the problem is serious and urgent, please call 911. ____________________________________________________________________________________________   Facet Joint Block The facet joints connect the bones of the spine (vertebrae). They make it possible for you to bend, twist, and make other movements with your spine. They also keep you from bending too far, twisting too far, and making other extreme movements. A facet joint block is a procedure in which a numbing medicine (anesthetic) is injected into a facet joint. In many cases, an anti-inflammatory medicine (steroid) is also injected. A facet joint block may be done:  To diagnose neck or back pain. If the pain gets better after a facet joint block,  it means the pain is probably coming from the facet joint. If the pain does not get better, it means the pain is probably not coming from the facet joint.  To relieve neck or back pain that is caused by an inflamed facet joint. A facet joint block is only done to relieve pain if the pain does not improve with other methods, such as medicine, exercise programs, and physical therapy. Tell a health care provider about:  Any allergies you have.  All medicines you are taking, including vitamins, herbs, eye drops, creams, and over-the-counter medicines.  Any problems you or family members have had with anesthetic medicines.  Any blood disorders you have.  Any surgeries you have had.  Any medical conditions you have or have had.  Whether you are pregnant or may be pregnant. What are the risks? Generally, this is a safe procedure. However, problems may occur, including:  Bleeding.  Injury to a nerve near the injection site.  Pain at the injection site.  Weakness or numbness in areas controlled by nerves near the injection site.  Infection.  Temporary fluid retention.  Allergic reactions to medicines or dyes.  Injury to other structures or organs near the injection site. What happens before the procedure? Medicines Ask your health care provider about:  Changing or stopping your regular medicines. This is especially important if you  are taking diabetes medicines or blood thinners.  Taking medicines such as aspirin and ibuprofen. These medicines can thin your blood. Do not take these medicines unless your health care provider tells you to take them.  Taking over-the-counter medicines, vitamins, herbs, and supplements. Eating and drinking Follow instructions from your health care provider about eating and drinking, which may include:  8 hours before the procedure - stop eating heavy meals or foods, such as meat, fried foods, or fatty foods.  6 hours before the procedure - stop  eating light meals or foods, such as toast or cereal.  6 hours before the procedure - stop drinking milk or drinks that contain milk.  2 hours before the procedure - stop drinking clear liquids. Staying hydrated Follow instructions from your health care provider about hydration, which may include:  Up to 2 hours before the procedure - you may continue to drink clear liquids, such as water, clear fruit juice, black coffee, and plain tea. General instructions  Do not use any products that contain nicotine or tobacco for at least 4-6 weeks before the procedure. These products include cigarettes, e-cigarettes, and chewing tobacco. If you need help quitting, ask your health care provider.  Plan to have someone take you home from the hospital or clinic.  Ask your health care provider: ? How your surgery site will be marked. ? What steps will be taken to help prevent infection. These may include:  Removing hair at the surgery site.  Washing skin with a germ-killing soap.  Receiving antibiotic medicine. What happens during the procedure?   You will put on a hospital gown.  You will lie on your stomach on an X-ray table. You may be asked to lie in a different position if an injection will be made in your neck.  Machines will be used to monitor your oxygen levels, heart rate, and blood pressure.  Your skin will be cleaned.  If an injection will be made in your neck, an IV will be inserted into one of your veins. Fluids and medicine will flow directly into your body through the IV.  A numbing medicine (local anesthetic) will be applied to your skin. Your skin may sting or burn for a moment.  A video X-ray machine (fluoroscopy) will be used to find the joint. In some cases, a CT scan may be used.  A contrast dye may be injected into the facet joint area to help find the joint.  When the joint is located, an anesthetic will be injected into the joint through the needle.  Your health  care provider will ask you whether you feel pain relief. ? If you feel relief, a steroid may be injected to provide pain relief for a longer period of time. ? If you do not feel relief or feel only partial relief, additional injections of an anesthetic may be made in other facet joints.  The needle will be removed.  Your skin will be cleaned.  A bandage (dressing) will be applied over each injection site. The procedure may vary among health care providers and hospitals. What happens after the procedure?  Your blood pressure, heart rate, breathing rate, and blood oxygen level will be monitored until you leave the hospital or clinic.  You will lie down and rest for a period of time. Summary  A facet joint block is a procedure in which a numbing medicine (anesthetic) is injected into a facet joint. An anti-inflammatory medicine (stereoid) may also be injected.  Follow  instructions from your health care provider about medicines and eating and drinking before the procedure.  Do not use any products that contain nicotine or tobacco for at least 4-6 weeks before the procedure.  You will lie on your stomach for the procedure, but you may be asked to lie in a different position if an injection will be made in your neck.  When the joint is located, an anesthetic will be injected into the joint through the needle. This information is not intended to replace advice given to you by your health care provider. Make sure you discuss any questions you have with your health care provider. Document Revised: 05/22/2018 Document Reviewed: 01/03/2018 Elsevier Patient Education  [place].

## 2020-01-05 ENCOUNTER — Telehealth: Payer: Self-pay

## 2020-01-05 NOTE — Telephone Encounter (Signed)
Post procedure phone call.  Patient states she is doing OK 

## 2020-01-11 ENCOUNTER — Other Ambulatory Visit: Payer: Self-pay | Admitting: Podiatry

## 2020-01-11 NOTE — Telephone Encounter (Signed)
Please advise 

## 2020-01-12 IMAGING — NM NM HEPATOBILIARY IMAGE, INC GB
1 series · 6 of 6 positions shown · non-contrast
Comparison: 04/18/2017 ultrasound.

CLINICAL DATA: 51-year-old female with right upper quadrant pain
for 4 months. Subsequent encounter.

EXAM:
NUCLEAR MEDICINE HEPATOBILIARY IMAGING
TECHNIQUE: Sequential images of the abdomen were obtained [DATE] minutes
following intravenous administration of radiopharmaceutical.
RADIOPHARMACEUTICALS:  5.6 mCi [id number]  Choletec IV

[Series 1000: hepatobiliary scan · 9.59mm/px · 6 of 60 frames shown]
[frame 6/60]
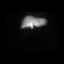
[frame 16/60]
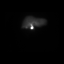
[frame 26/60]
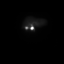
[frame 36/60]
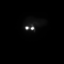
[frame 46/60]
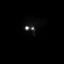
[frame 56/60]
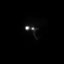

[6 of 6 positions shown; findings below may reference images not displayed]

FINDINGS: Prompt uptake and biliary excretion of activity by the liver is
seen. Gallbladder activity is visualized, consistent with patency of
cystic duct. Biliary activity passes into small bowel, consistent
with patent common bile duct.
IMPRESSION: Gallbladder and small bowel visualized. Findings consistent with
patent cystic duct and common bile duct respectively.

## 2020-01-13 ENCOUNTER — Telehealth: Payer: Self-pay | Admitting: Student in an Organized Health Care Education/Training Program

## 2020-01-13 ENCOUNTER — Telehealth: Payer: Self-pay | Admitting: Podiatry

## 2020-01-13 ENCOUNTER — Encounter: Payer: Self-pay | Admitting: Dietician

## 2020-01-13 DIAGNOSIS — M5416 Radiculopathy, lumbar region: Secondary | ICD-10-CM

## 2020-01-13 NOTE — Telephone Encounter (Signed)
Please advise 

## 2020-01-13 NOTE — Telephone Encounter (Signed)
Patient is requesting epid on back, she just had bilateral lumbar facets on 11:22. Please ask dr lateed. Her follow up is scheduled on 12-20

## 2020-01-13 NOTE — Telephone Encounter (Signed)
Patient has requested refill for gaupentin 300mg , please advise

## 2020-01-14 NOTE — Telephone Encounter (Signed)
I want her to wait at least 30 days from her lumbar facets before doing epidural however I will place order.

## 2020-01-14 NOTE — Addendum Note (Signed)
Addended by: [name] on: 01/14/2020 10:44 AM   Modules accepted: Orders

## 2020-01-14 NOTE — Telephone Encounter (Signed)
Please call patient to tell her this, and make appointment.

## 2020-01-18 DIAGNOSIS — H902 Conductive hearing loss, unspecified: Secondary | ICD-10-CM | POA: Diagnosis not present

## 2020-01-18 DIAGNOSIS — H6123 Impacted cerumen, bilateral: Secondary | ICD-10-CM | POA: Diagnosis not present

## 2020-01-19 ENCOUNTER — Encounter: Payer: Self-pay | Admitting: Family Medicine

## 2020-01-26 ENCOUNTER — Telehealth: Payer: Self-pay

## 2020-01-26 NOTE — Telephone Encounter (Signed)
Returned patient phone call re; diabetic medication and procedure visit. Voicemail left to please call us back to discuss.

## 2020-01-26 NOTE — Telephone Encounter (Signed)
She has a procedure on 12/20. She is diabetic and wants to speak to someone about her medications and the procedure. Please call her.

## 2020-01-27 NOTE — Telephone Encounter (Signed)
All questions answered

## 2020-02-01 ENCOUNTER — Telehealth: Payer: Medicare PPO | Admitting: Student in an Organized Health Care Education/Training Program

## 2020-02-03 ENCOUNTER — Other Ambulatory Visit: Payer: Self-pay

## 2020-02-03 ENCOUNTER — Telehealth: Payer: Self-pay

## 2020-02-03 ENCOUNTER — Ambulatory Visit
Admission: RE | Admit: 2020-02-03 | Discharge: 2020-02-03 | Disposition: A | Discharge status: Home / Self Care | Payer: Medicare PPO | Source: Ambulatory Visit | Attending: Student in an Organized Health Care Education/Training Program | Admitting: Student in an Organized Health Care Education/Training Program

## 2020-02-03 ENCOUNTER — Ambulatory Visit (HOSPITAL_BASED_OUTPATIENT_CLINIC_OR_DEPARTMENT_OTHER): Payer: Medicare PPO | Admitting: Student in an Organized Health Care Education/Training Program

## 2020-02-03 ENCOUNTER — Encounter: Payer: Self-pay | Admitting: Student in an Organized Health Care Education/Training Program

## 2020-02-03 VITALS — BP 157/74 | HR 83 | Temp 97.3°F | Resp 18 | Wt 333.0 lb

## 2020-02-03 DIAGNOSIS — M5416 Radiculopathy, lumbar region: Secondary | ICD-10-CM | POA: Insufficient documentation

## 2020-02-03 DIAGNOSIS — G894 Chronic pain syndrome: Secondary | ICD-10-CM | POA: Insufficient documentation

## 2020-02-03 MED ORDER — SODIUM CHLORIDE 0.9% FLUSH
2.0000 mL | Freq: Once | INTRAVENOUS | Status: AC
  Administered 2020-02-03: 11:00:00 10 mL

## 2020-02-03 MED ORDER — ROPIVACAINE HCL 2 MG/ML IJ SOLN
2.0000 mL | Freq: Once | INTRAMUSCULAR | Status: AC
  Administered 2020-02-03: 11:00:00 10 mL via EPIDURAL
  Filled 2020-02-03: qty 10

## 2020-02-03 MED ORDER — IOHEXOL 180 MG/ML  SOLN
10.0000 mL | Freq: Once | INTRAMUSCULAR | Status: AC
  Administered 2020-02-03: 11:00:00 10 mL via EPIDURAL
  Filled 2020-02-03: qty 20

## 2020-02-03 MED ORDER — DEXAMETHASONE SODIUM PHOSPHATE 10 MG/ML IJ SOLN
10.0000 mg | Freq: Once | INTRAMUSCULAR | Status: AC
  Administered 2020-02-03: 11:00:00 10 mg
  Filled 2020-02-03: qty 1

## 2020-02-03 MED ORDER — LIDOCAINE HCL 2 % IJ SOLN
20.0000 mL | Freq: Once | INTRAMUSCULAR | Status: AC
  Administered 2020-02-03: 11:00:00 400 mg

## 2020-02-03 NOTE — Telephone Encounter (Signed)
Patient called and states that she is having soreness in her back.  States that the pain is better than prior to the procedure.  Encouraged patient to put ice on her back today and switch over to heat tomorrow.  Instructed patient to continue her normal medications and to call us for any questions or concerns.

## 2020-02-03 NOTE — Progress Notes (Signed)
PROVIDER NOTE: Information contained herein reflects review and annotations entered in association with encounter. Interpretation of such information and data should be left to medically-trained personnel. Information provided to patient can be located elsewhere in the medical record under "Patient Instructions". Document created using STT-dictation technology, any transcriptional errors that may result from process are unintentional.    Patient: Mackenzie Bonilla  Service Category: Procedure  Provider: [name], MD  DOB: 1965/09/18  DOS: 02/03/2020  Location: [facility]  MRN: [id number]  Setting: Ambulatory - outpatient  Referring Provider: Abner Greenspan, MD  Type: Established Patient  Specialty: Interventional Pain Management  PCP: Abner Greenspan, MD   Primary Reason for Visit: Interventional Pain Management Treatment. CC: Back Pain (low)  Procedure:          Anesthesia, Analgesia, Anxiolysis:  Type: Diagnostic Inter-Laminar Epidural Steroid Injection  #1  Region: Lumbar Level: L5-S1 Level. Laterality: Midline         Type: Local Anesthesia  Local Anesthetic: Lidocaine 1-2%  Position: Prone with head of the table was raised to facilitate breathing.   Indications: 1. Lumbar radicular pain    2. Chronic pain syndrome    Pain Score: Pre-procedure: 4 /10 Post-procedure: 2 /10   Pre-op H&P Assessment:  Mackenzie Bonilla is a 54 y.o. (year old), female patient, seen today for interventional treatment. She  has a past surgical history that includes Tonsillectomy; Septoplasty; Foot surgery; uterine tumor (09/2001); Vagus nerve stimulator insertion; Endometrial biopsy (01/2004); Uterine fibroid surgery (06/2005); Colonoscopy (08/12/2012); Esophagogastroduodenoscopy; Breast lumpectomy with radioactive seed localization (Right, 08/02/2014); Breast biopsy (Right, 2016); and Esophagogastroduodenoscopy (egd) with propofol (N/A, 08/18/2019). Mackenzie Bonilla has a current medication list  which includes the following prescription(s): accu-chek aviva plus, accu-chek softclix lancets, acetaminophen, albuterol, alprazolam, accu-chek aviva, fifty50 glucose meter 2.0, cyclobenzaprine, desvenlafaxine, dimenhydrinate, fluticasone, gabapentin, gabapentin, glipizide, ibuprofen, insulin degludec, lantus solostar, losartan, magnesium, nystatin cream, omeprazole, oxybutynin, polyethyl glycol-propyl glycol, pravastatin, simethicone, spironolactone, tramadol, trazodone, trulicity, and zinc oxide. Her primarily concern today is the Back Pain (low)  Initial Vital Signs:  Pulse/HCG Rate: 83ECG Heart Rate: 68 Temp: (!) 97.3 F (36.3 C) Resp: 18 BP: 130/86 SpO2: 97 %  BMI: Estimated body mass index is 57.16 kg/m as calculated from the following:   Height as of 01/04/20: 5\' 4"  (1.626 m).   Weight as of this encounter: 333 lb (151 kg).  Risk Assessment: Allergies: Reviewed. She is allergic to cephalexin, codeine, duloxetine, levaquin [levofloxacin], lisinopril, metformin and related, quetiapine, sertraline hcl, triazolam, zaleplon, zocor [simvastatin - high dose], zolpidem tartrate, hydrocodone, and norelgestromin-eth estradiol.  Allergy Precautions: None required Coagulopathies: Reviewed. None identified.  Blood-thinner therapy: None at this time Active Infection(s): Reviewed. None identified. Mackenzie Bonilla is afebrile  Site Confirmation: Mackenzie Bonilla was asked to confirm the procedure and laterality before marking the site Procedure checklist: Completed Consent: Before the procedure and under the influence of no sedative(s), amnesic(s), or anxiolytics, the patient was informed of the treatment options, risks and possible complications. To fulfill our ethical and legal obligations, as recommended by the American Medical Association's Code of Ethics, I have informed the patient of my clinical impression; the nature and purpose of the treatment or procedure; the risks, benefits, and possible  complications of the intervention; the alternatives, including doing nothing; the risk(s) and benefit(s) of the alternative treatment(s) or procedure(s); and the risk(s) and benefit(s) of doing nothing. The patient was provided information about the general risks and possible complications associated with the procedure. These may  include, but are not limited to: failure to achieve desired goals, infection, bleeding, organ or nerve damage, allergic reactions, paralysis, and death. In addition, the patient was informed of those risks and complications associated to Spine-related procedures, such as failure to decrease pain; infection (i.e.: Meningitis, epidural or intraspinal abscess); bleeding (i.e.: epidural hematoma, subarachnoid hemorrhage, or any other type of intraspinal or peri-dural bleeding); organ or nerve damage (i.e.: Any type of peripheral nerve, nerve root, or spinal cord injury) with subsequent damage to sensory, motor, and/or autonomic systems, resulting in permanent pain, numbness, and/or weakness of one or several areas of the body; allergic reactions; (i.e.: anaphylactic reaction); and/or death. Furthermore, the patient was informed of those risks and complications associated with the medications. These include, but are not limited to: allergic reactions (i.e.: anaphylactic or anaphylactoid reaction(s)); adrenal axis suppression; blood sugar elevation that in diabetics may result in ketoacidosis or comma; water retention that in patients with history of congestive heart failure may result in shortness of breath, pulmonary edema, and decompensation with resultant heart failure; weight gain; swelling or edema; medication-induced neural toxicity; particulate matter embolism and blood vessel occlusion with resultant organ, and/or nervous system infarction; and/or aseptic necrosis of one or more joints. Finally, the patient was informed that Medicine is not an exact science; therefore, there is also  the possibility of unforeseen or unpredictable risks and/or possible complications that may result in a catastrophic outcome. The patient indicated having understood very clearly. We have given the patient no guarantees and we have made no promises. Enough time was given to the patient to ask questions, all of which were answered to the patient's satisfaction. Mackenzie Bonilla has indicated that she wanted to continue with the procedure. Attestation: I, the ordering provider, attest that I have discussed with the patient the benefits, risks, side-effects, alternatives, likelihood of achieving goals, and potential problems during recovery for the procedure that I have provided informed consent. Date  Time: 02/03/2020  9:57 AM  Pre-Procedure Preparation:  Monitoring: As per clinic protocol. Respiration, ETCO2, SpO2, BP, heart rate and rhythm monitor placed and checked for adequate function Safety Precautions: Patient was assessed for positional comfort and pressure points before starting the procedure. Time-out: I initiated and conducted the "Time-out" before starting the procedure, as per protocol. The patient was asked to participate by confirming the accuracy of the "Time Out" information. Verification of the correct person, site, and procedure were performed and confirmed by me, the nursing staff, and the patient. "Time-out" conducted as per Joint Commission's Universal Protocol (UP.01.01.01). Time: 1105  Description of Procedure:          Target Area: The interlaminar space, initially targeting the lower laminar border of the superior vertebral body. Approach: Paramedial approach. Area Prepped: Entire Posterior Lumbar Region DuraPrep (Iodine Povacrylex [0.7% available iodine] and Isopropyl Alcohol, 74% w/w) Safety Precautions: Aspiration looking for blood return was conducted prior to all injections. At no point did we inject any substances, as a needle was being advanced. No attempts were made at  seeking any paresthesias. Safe injection practices and needle disposal techniques used. Medications properly checked for expiration dates. SDV (single dose vial) medications used. Description of the Procedure: Protocol guidelines were followed. The procedure needle was introduced through the skin, ipsilateral to the reported pain, and advanced to the target area. Bone was contacted and the needle walked caudad, until the lamina was cleared. The epidural space was identified using "loss-of-resistance technique" with 2-3 ml of PF-NaCl (0.9% NSS), in a 5cc  LOR glass syringe.  Vitals:   02/03/20 1032 02/03/20 1108 02/03/20 1112  BP: 130/86 (!) 152/90 (!) 157/74  Pulse: 83    Resp: 18 12 18   Temp: (!) 97.3 F (36.3 C)    TempSrc: Temporal    SpO2: 97% 94% 92%  Weight: (!) 333 lb (151 kg)      Start Time: 1105 hrs. End Time: 1111 hrs.  Materials:  Needle(s) Type: Epidural needle Gauge: 22G Length: 3.5-in Medication(s): Please see orders for medications and dosing details. 6 cc solution made of 3 cc of preservative-free saline, 2 cc of 0.2% ropivacaine, 1 cc of Decadron 10 mg/cc.  Imaging Guidance (Spinal):          Type of Imaging Technique: Fluoroscopy Guidance (Spinal) Indication(s): Assistance in needle guidance and placement for procedures requiring needle placement in or near specific anatomical locations not easily accessible without such assistance. Exposure Time: Please see nurses notes. Contrast: Before injecting any contrast, we confirmed that the patient did not have an allergy to iodine, shellfish, or radiological contrast. Once satisfactory needle placement was completed at the desired level, radiological contrast was injected. Contrast injected under live fluoroscopy. No contrast complications. See chart for type and volume of contrast used. Fluoroscopic Guidance: I was personally present during the use of fluoroscopy. "Tunnel Vision Technique" used to obtain the best possible  view of the target area. Parallax error corrected before commencing the procedure. "Direction-depth-direction" technique used to introduce the needle under continuous pulsed fluoroscopy. Once target was reached, antero-posterior, oblique, and lateral fluoroscopic projection used confirm needle placement in all planes. Images permanently stored in EMR. Interpretation: I personally interpreted the imaging intraoperatively. Adequate needle placement confirmed in multiple planes. Appropriate spread of contrast into desired area was observed. No evidence of afferent or efferent intravascular uptake. No intrathecal or subarachnoid spread observed. Permanent images saved into the patient's record.  Antibiotic Prophylaxis:   Anti-infectives (From admission, onward)   None     Indication(s): None identified  Post-operative Assessment:  Post-procedure Vital Signs:  Pulse/HCG Rate: 8374 Temp: (!) 97.3 F (36.3 C) Resp: 18 BP: (!) 157/74 SpO2: 92 %  EBL: None  Complications: No immediate post-treatment complications observed by team, or reported by patient.  Note: The patient tolerated the entire procedure well. A repeat set of vitals were taken after the procedure and the patient was kept under observation following institutional policy, for this type of procedure. Post-procedural neurological assessment was performed, showing return to baseline, prior to discharge. The patient was provided with post-procedure discharge instructions, including a section on how to identify potential problems. Should any problems arise concerning this procedure, the patient was given instructions to immediately contact us, at any time, without hesitation. In any case, we plan to contact the patient by telephone for a follow-up status report regarding this interventional procedure.  Comments:  No additional relevant information.  5 out of 5 strength bilateral lower extremity: Plantar flexion, dorsiflexion, knee  flexion, knee extension.  Denies headaches/vision changes. Patient sat up for 10-15 mins after procedure without any issues.  Plan of Care  Orders:  Orders Placed This Encounter  Procedures  . DG PAIN CLINIC C-ARM 1-60 MIN NO REPORT    Intraoperative interpretation by procedural physician at [facility].    Standing Status:   Standing    Number of Occurrences:   1    Order Specific Question:   Reason for exam:    Answer:   Assistance in needle guidance and placement  for procedures requiring needle placement in or near specific anatomical locations not easily accessible without such assistance.    Medications ordered for procedure: Meds ordered this encounter  Medications  . iohexol (OMNIPAQUE) 180 MG/ML injection 10 mL    Must be Myelogram-compatible. If not available, you may substitute with a water-soluble, non-ionic, hypoallergenic, myelogram-compatible radiological contrast medium.  Marland Kitchen lidocaine (XYLOCAINE) 2 % (with pres) injection 400 mg  . ropivacaine (PF) 2 mg/mL (0.2%) (NAROPIN) injection 2 mL  . sodium chloride flush (NS) 0.9 % injection 2 mL  . dexamethasone (DECADRON) injection 10 mg   Medications administered: We administered iohexol, lidocaine, ropivacaine (PF) 2 mg/mL (0.2%), sodium chloride flush, and dexamethasone.  See the medical record for exact dosing, route, and time of administration.  Follow-up plan:   Return in about 4 weeks (around 03/02/2020) for Post Procedure Evaluation, virtual.      Status post bilateral L3, L4, L5 facet medial branch nerve blocks 01/04/2020. S/p  L4/5 ESI 02/03/20: 6 cc injected    Recent Visits Date Type Provider Dept  01/04/20 Procedure visit [name], MD [facility]  12/29/19 Office Visit [name], MD [facility]  12/08/19 Office Visit [name], MD [facility]  Showing recent visits within past 90 days and meeting all other requirements Today's Visits Date Type Provider  Dept  02/03/20 Procedure visit [name], MD [facility]  Showing today's visits and meeting all other requirements Future Appointments Date Type Provider Dept  03/02/20 Appointment [name], MD [facility]  Showing future appointments within next 90 days and meeting all other requirements  Disposition: Discharge home  Discharge (Date  Time): 02/03/2020; 1115 hrs.   Primary Care Physician: Tower, Wynelle Fanny, MD Location: [facility] Note by: [name], MD Date: 02/03/2020; Time: 11:46 AM  Disclaimer:  Medicine is not an exact science. The only guarantee in medicine is that nothing is guaranteed. It is important to note that the decision to proceed with this intervention was based on the information collected from the patient. The Data and conclusions were drawn from the patient's questionnaire, the interview, and the physical examination. Because the information was provided in large part by the patient, it cannot be guaranteed that it has not been purposely or unconsciously manipulated. Every effort has been made to obtain as much relevant data as possible for this evaluation. It is important to note that the conclusions that lead to this procedure are derived in large part from the available data. Always take into account that the treatment will also be dependent on availability of resources and existing treatment guidelines, considered by other Pain Management Practitioners as being common knowledge and practice, at the time of the intervention. For Medico-Legal purposes, it is also important to point out that variation in procedural techniques and pharmacological choices are the acceptable norm. The indications, contraindications, technique, and results of the above procedure should only be interpreted and judged by a Board-Certified Interventional Pain Specialist with extensive familiarity and expertise in the same exact procedure and  technique.

## 2020-02-03 NOTE — Patient Instructions (Signed)

## 2020-02-03 NOTE — Progress Notes (Signed)
Safety precautions to be maintained throughout the outpatient stay will include: orient to surroundings, keep bed in low position, maintain call bell within reach at all times, provide assistance with transfer out of bed and ambulation.  

## 2020-02-04 NOTE — Progress Notes (Signed)
Patient is doing well from her procedure yesterday. BS well controlled. No issues. No headaches, no vision changes, no nausea.

## 2020-02-04 NOTE — Telephone Encounter (Signed)
Dr Holley Raring states he will call patient.

## 2020-02-09 ENCOUNTER — Ambulatory Visit: Payer: Medicare PPO | Admitting: Podiatry

## 2020-02-10 ENCOUNTER — Ambulatory Visit: Payer: Medicare PPO | Admitting: Podiatry

## 2020-02-23 ENCOUNTER — Ambulatory Visit: Payer: Medicare PPO | Admitting: Podiatry

## 2020-02-23 ENCOUNTER — Other Ambulatory Visit: Payer: Self-pay

## 2020-02-23 DIAGNOSIS — E1169 Type 2 diabetes mellitus with other specified complication: Secondary | ICD-10-CM | POA: Diagnosis not present

## 2020-02-23 DIAGNOSIS — M7751 Other enthesopathy of right foot: Secondary | ICD-10-CM | POA: Diagnosis not present

## 2020-02-23 DIAGNOSIS — N1831 Chronic kidney disease, stage 3a: Secondary | ICD-10-CM | POA: Diagnosis not present

## 2020-02-23 DIAGNOSIS — E1129 Type 2 diabetes mellitus with other diabetic kidney complication: Secondary | ICD-10-CM | POA: Diagnosis not present

## 2020-02-23 DIAGNOSIS — B351 Tinea unguium: Secondary | ICD-10-CM | POA: Diagnosis not present

## 2020-02-23 DIAGNOSIS — E1122 Type 2 diabetes mellitus with diabetic chronic kidney disease: Secondary | ICD-10-CM | POA: Diagnosis not present

## 2020-02-23 DIAGNOSIS — E0843 Diabetes mellitus due to underlying condition with diabetic autonomic (poly)neuropathy: Secondary | ICD-10-CM

## 2020-02-23 DIAGNOSIS — E669 Obesity, unspecified: Secondary | ICD-10-CM | POA: Diagnosis not present

## 2020-02-23 DIAGNOSIS — Z6841 Body Mass Index (BMI) 40.0 and over, adult: Secondary | ICD-10-CM | POA: Diagnosis not present

## 2020-02-23 DIAGNOSIS — M79676 Pain in unspecified toe(s): Secondary | ICD-10-CM | POA: Diagnosis not present

## 2020-02-23 DIAGNOSIS — M7752 Other enthesopathy of left foot: Secondary | ICD-10-CM | POA: Diagnosis not present

## 2020-02-23 DIAGNOSIS — R809 Proteinuria, unspecified: Secondary | ICD-10-CM | POA: Diagnosis not present

## 2020-02-23 DIAGNOSIS — E1142 Type 2 diabetes mellitus with diabetic polyneuropathy: Secondary | ICD-10-CM | POA: Diagnosis not present

## 2020-02-23 NOTE — Progress Notes (Signed)
   SUBJECTIVE Patient presents today for follow-up evaluation regarding bilateral foot pain and swelling.  Patient states that she is doing well except for depression.  She states that the injection she received last visit helped significantly.  She continues to wear her [place] shoes bilateral.  She is requesting a nail trim today.  Past Medical History:  Diagnosis Date   Allergy    allergic rhinitis   Anemia    Anxiety    Arthritis    osteoarthritis   Asthma    Claustrophobia    does not like oxygen mask covering face   Depression    Diabetes mellitus without complication (HCC)    Fall 2016   Fatty liver 07/2008   abd. ultrasound - fatty liver ; slt dilated cbd (no stones ) 06/10// abd.  ultrasound normal on 04/2006   Fibromyalgia    GERD (gastroesophageal reflux disease)    EGD negative 06/2001// EGD erythematous mucosa, polyp 08/2008   High uric acid in 24 hour urine specimen    Hyperhidrosis    Hyperlipidemia    Kidney stones    Lymphedema    Obesity    PCOS (polycystic ovarian syndrome)    Shortness of breath dyspnea    Tachycardia    TMJ (dislocation of temporomandibular joint)    UTI (lower urinary tract infection)     OBJECTIVE General Patient is awake, alert, and oriented x 3 and in no acute distress. Derm Skin is dry and supple bilateral. Negative open lesions or macerations. Remaining integument unremarkable. Nails are tender, long, thickened and dystrophic with subungual debris, consistent with onychomycosis, 1-5 bilateral. No signs of infection noted.  Hyperkeratotic preulcerative callus tissue noted to the bilateral feet and toes Vasc  DP and PT pedal pulses palpable bilaterally. Temperature gradient within normal limits.  Edema noted bilateral lower extremities Neuro Epicritic and protective threshold sensation grossly intact bilaterally.  Musculoskeletal Exam No symptomatic pedal deformities noted bilateral. Muscular strength within normal limits.  Pain on  palpation range of motion to the bilateral great toe joints IPJ  ASSESSMENT 1.  Chronic bilateral lower extremity foot pain 2.  Capsulitis bilateral great toes 3.  Bilateral lower extremity edema 4.  Pain due to onychomycosis of toenail bilateral  PLAN OF CARE 1. Patient evaluated today.  2. Instructed to maintain good pedal hygiene and foot care.  3.  Continue wearing [place] shoes from [employer] 4.  Injection of 0.5 cc Celestone Soluspan injected into the great toe IPJ bilateral 5.  Recommend applying Revitaderm urea 40% lotion that was provided last visit 6.  Mechanical debridement of nails 1-5 was performed using a nail nipper without incident or bleeding  7.  Return to clinic as needed   Loney Domingo M. Nihal Marzella, DPM [facility]  Dr. Diyari Cherne M. Margherita Collyer, DPM    [address]                                        [place], [place] [address]                Office [phone]  Fax [phone]     

## 2020-02-23 NOTE — Progress Notes (Signed)
Pt was called concerning up coming virtual appointment. Pt stated that she didn't get much relief from procedure on 02/03/20. She went her Podiatry appointment today and received celestone soluspan injection 0.5cc into bilateral great toes for foot pain and swelling.

## 2020-02-24 ENCOUNTER — Encounter: Payer: Self-pay | Admitting: Student in an Organized Health Care Education/Training Program

## 2020-02-24 ENCOUNTER — Ambulatory Visit
Payer: Medicare PPO | Attending: Student in an Organized Health Care Education/Training Program | Admitting: Student in an Organized Health Care Education/Training Program

## 2020-02-24 DIAGNOSIS — G894 Chronic pain syndrome: Secondary | ICD-10-CM | POA: Diagnosis not present

## 2020-02-24 DIAGNOSIS — M47816 Spondylosis without myelopathy or radiculopathy, lumbar region: Secondary | ICD-10-CM

## 2020-02-24 DIAGNOSIS — M797 Fibromyalgia: Secondary | ICD-10-CM

## 2020-02-24 DIAGNOSIS — M5136 Other intervertebral disc degeneration, lumbar region: Secondary | ICD-10-CM | POA: Diagnosis not present

## 2020-02-24 DIAGNOSIS — M545 Low back pain, unspecified: Secondary | ICD-10-CM | POA: Diagnosis not present

## 2020-02-24 DIAGNOSIS — M5416 Radiculopathy, lumbar region: Secondary | ICD-10-CM | POA: Diagnosis not present

## 2020-02-24 NOTE — Progress Notes (Signed)
Patient: Mackenzie Bonilla  Service Category: E/M  Provider: [name], MD  DOB: 06/20/1965  DOS: 02/24/2020  Location: Office  MRN: [id number]  Setting: Ambulatory outpatient  Referring Provider: Abner Greenspan, MD  Type: Established Patient  Specialty: Interventional Pain Management  PCP: Abner Greenspan, MD  Location: Home  Delivery: TeleHealth     Virtual Encounter - Pain Management PROVIDER NOTE: Information contained herein reflects review and annotations entered in association with encounter. Interpretation of such information and data should be left to medically-trained personnel. Information provided to patient can be located elsewhere in the medical record under "Patient Instructions". Document created using STT-dictation technology, any transcriptional errors that may result from process are unintentional.    Contact & Pharmacy Preferred: [phone] Home: [phone] (home) Mobile: [phone] (mobile) E-mail: [email or web address]  [facility] - [place], [place] [address] Phone: [phone] Fax: [phone]   Pre-screening  [name] offered "in-person" vs "virtual" encounter. She indicated preferring virtual for this encounter.   Reason COVID-19*  Social distancing based on CDC and AMA recommendations.   I contacted Mackenzie Bonilla on 02/24/2020 via video conference.      I clearly identified myself as [name], MD. I verified that I was speaking with the correct person using two identifiers (Name: Mackenzie Bonilla, and date of birth: October 29, 1965).  Consent I sought verbal advanced consent from Mackenzie Bonilla for virtual visit interactions. I informed Mackenzie Bonilla of possible security and privacy concerns, risks, and limitations associated with providing "not-in-person" medical evaluation and management services. I also informed Mackenzie Bonilla of the availability of "in-person" appointments. Finally, I  informed her that there would be a charge for the virtual visit and that she could be  personally, fully or partially, financially responsible for it. Mackenzie Bonilla expressed understanding and agreed to proceed.   Historic Elements   [name] is a 55 y.o. year old, female patient evaluated today after our last contact on 02/03/2020. Ms. Putman  has a past medical history of Allergy, Anemia, Anxiety, Arthritis, Asthma, Claustrophobia, Depression, Diabetes mellitus without complication ([place]), Fall (2016), Fatty liver (07/2008), Fibromyalgia, GERD (gastroesophageal reflux disease), High uric acid in 24 hour urine specimen, Hyperhidrosis, Hyperlipidemia, Kidney stones, Lymphedema, Obesity, PCOS (polycystic ovarian syndrome), Shortness of breath dyspnea, Tachycardia, TMJ (dislocation of temporomandibular joint), and UTI (lower urinary tract infection). She also  has a past surgical history that includes Tonsillectomy; Septoplasty; Foot surgery; uterine tumor (09/2001); Vagus nerve stimulator insertion; Endometrial biopsy (01/2004); Uterine fibroid surgery (06/2005); Colonoscopy (08/12/2012); Esophagogastroduodenoscopy; Breast lumpectomy with radioactive seed localization (Right, 08/02/2014); Breast biopsy (Right, 2016); and Esophagogastroduodenoscopy (egd) with propofol (N/A, 08/18/2019). [name] has a current medication list which includes the following prescription(s): accu-chek aviva plus, accu-chek softclix lancets, acetaminophen, albuterol, alprazolam, accu-chek aviva, fifty50 glucose meter 2.0, bupropion, cyclobenzaprine, desvenlafaxine, dimenhydrinate, fluticasone, gabapentin, gabapentin, glipizide, ibuprofen, insulin degludec, losartan, magnesium, nystatin cream, omeprazole, oxybutynin, polyethyl glycol-propyl glycol, pravastatin, simethicone, spironolactone, tramadol, trazodone, trulicity, zinc oxide, and lantus solostar. She  reports that she has never smoked. She has never used smokeless  tobacco. She reports that she does not drink alcohol and does not use drugs. [name] is allergic to cephalexin, codeine, duloxetine, levaquin [levofloxacin], lisinopril, metformin and related, quetiapine, sertraline hcl, triazolam, zaleplon, zocor [simvastatin - high dose], zolpidem tartrate, hydrocodone, and norelgestromin-eth estradiol.   HPI  Today, she is being contacted for a post-procedure assessment.  Post-Procedure Evaluation  Procedure (02/03/2020):  Type: Diagnostic Inter-Laminar Epidural Steroid Injection  #1  Region: Lumbar Level: L5-S1 Level. Laterality: Midline     Sedation: Please see nurses note.  Effectiveness during initial hour after procedure(Ultra-Short Term Relief): 100 %   Local anesthetic used: Long-acting (4-6 hours) Effectiveness: Defined as any analgesic benefit obtained secondary to the administration of local anesthetics. This carries significant diagnostic value as to the etiological location, or anatomical origin, of the pain. Duration of benefit is expected to coincide with the duration of the local anesthetic used.  Effectiveness during initial 4-6 hours after procedure(Short-Term Relief): 100 %   Long-term benefit: Defined as any relief past the pharmacologic duration of the local anesthetics.  Effectiveness past the initial 6 hours after procedure(Long-Term Relief): 50 % (lasted for 1 week)   Current benefits: Defined as benefit that persist at this time.   Analgesia:  Back to baseline Function: Back to baseline ROM: Back to baseline  Laboratory Chemistry Profile   Renal Lab Results  Component Value Date   BUN 15 11/12/2018   CREATININE 1.10 11/12/2018   BCR 10 05/21/2017   GFR 51.89 (L) 11/12/2018   GFRAA 72 05/21/2017   GFRNONAA 62 05/21/2017     Hepatic Lab Results  Component Value Date   AST 15 11/12/2018   ALT 19 11/12/2018   ALBUMIN 4.3 11/12/2018   ALKPHOS 70 11/12/2018   AMYLASE 34 09/05/2016   LIPASE 22.0 09/05/2016      Electrolytes Lab Results  Component Value Date   NA 140 11/12/2018   K 4.7 11/12/2018   CL 101 11/12/2018   CALCIUM 9.7 11/12/2018   PHOS 4.0 03/07/2009     Bone Lab Results  Component Value Date   VD25OH 26 (L) 11/14/2011   TESTOFREE 2.1 05/21/2017   TESTOSTERONE 12 05/21/2017     Inflammation (CRP: Acute Phase) (ESR: Chronic Phase) Lab Results  Component Value Date   ESRSEDRATE 31 (H) 11/12/2018   LATICACIDVEN 1.11 06/23/2015       Note: Above Lab results reviewed.   Assessment  The primary encounter diagnosis was Lumbar facet arthropathy (severe right L4-L5). Diagnoses of Spondylosis without myelopathy or radiculopathy, lumbar region, Morbid obesity ([place]), Lumbar degenerative disc disease, Lumbar spine pain, Fibromyalgia, Lumbar radicular pain , and Chronic pain syndrome were also pertinent to this visit.  Plan of Care   Ms. MELIANA CANNER has a current medication list which includes the following long-term medication(s): albuterol, desvenlafaxine, gabapentin, gabapentin, omeprazole, pravastatin, spironolactone, trazodone, and lantus solostar.  1. Patient endorses mild pain relief in regards to her sciatica pain after L-ESI on 02/03/20. 2. States that she noticed greater pain relief after her lumbar facets which provided >70% pain relief for 3-4 weeks. Discussed repeating B/L L3,4,5 facets #2 and possible RFA thereafter.  Orders:  Orders Placed This Encounter  Procedures  . LUMBAR FACET(MEDIAL BRANCH NERVE BLOCK) MBNB    Standing Status:   Future    Standing Expiration Date:   03/26/2020    Scheduling Instructions:     Procedure: Lumbar facet block (AKA.: Lumbosacral medial branch nerve block)     Side: Bilateral     Level: L3-4, L4-5, Facets ( L3, L4, L5,  Medial Branch Nerves)     Sedation: Patient's choice.     Timeframe: ASAA    Order Specific Question:   Where will this procedure be performed?    Answer:   [facility]   Follow-up plan:    Return in about 1 week (around 03/02/2020)  for B/L L3, 4, 5 Fct MBNB #2 , without sedation.     Status post bilateral L3, L4, L5 facet medial branch nerve blocks 01/04/2020. S/p  L4/5 ESI 02/03/20: 6 cc injected     Recent Visits Date Type Provider Dept  02/03/20 Procedure visit [name], MD [facility]  01/04/20 Procedure visit [name], MD [facility]  12/29/19 Office Visit [name], MD [facility]  12/08/19 Office Visit [name], MD [facility]  Showing recent visits within past 90 days and meeting all other requirements Today's Visits Date Type Provider Dept  02/24/20 Telemedicine [name], MD [facility]  Showing today's visits and meeting all other requirements Future Appointments No visits were found meeting these conditions. Showing future appointments within next 90 days and meeting all other requirements  I discussed the assessment and treatment plan with the patient. The patient was provided an opportunity to ask questions and all were answered. The patient agreed with the plan and demonstrated an understanding of the instructions.  Patient advised to call back or seek an in-person evaluation if the symptoms or condition worsens.  Duration of encounter: 30 minutes.  Note by: [name], MD Date: 02/24/2020; Time: 2:26 PM

## 2020-03-01 ENCOUNTER — Telehealth: Payer: Self-pay | Admitting: Student in an Organized Health Care Education/Training Program

## 2020-03-01 NOTE — Telephone Encounter (Signed)
Done per Nonnie Done

## 2020-03-01 NOTE — Telephone Encounter (Signed)
Patient would like to speak with a nurse about what meds to take before coming in for procedure. Please call and advise her.

## 2020-03-02 ENCOUNTER — Telehealth: Payer: Medicare PPO | Admitting: Student in an Organized Health Care Education/Training Program

## 2020-03-02 ENCOUNTER — Ambulatory Visit: Payer: Medicare PPO | Admitting: Student in an Organized Health Care Education/Training Program

## 2020-03-04 ENCOUNTER — Other Ambulatory Visit: Payer: Self-pay | Admitting: Family Medicine

## 2020-03-08 ENCOUNTER — Telehealth: Payer: Self-pay

## 2020-03-08 NOTE — Telephone Encounter (Signed)
Had a question about her medication concerning her appt tomorrow.

## 2020-03-08 NOTE — Telephone Encounter (Signed)
Returned cll. No answer. LVM for her to return call.

## 2020-03-09 ENCOUNTER — Encounter: Payer: Self-pay | Admitting: Student in an Organized Health Care Education/Training Program

## 2020-03-09 ENCOUNTER — Ambulatory Visit
Admission: RE | Admit: 2020-03-09 | Discharge: 2020-03-09 | Disposition: A | Discharge status: Home / Self Care | Payer: Medicare PPO | Source: Ambulatory Visit | Attending: Student in an Organized Health Care Education/Training Program | Admitting: Student in an Organized Health Care Education/Training Program

## 2020-03-09 ENCOUNTER — Ambulatory Visit (HOSPITAL_BASED_OUTPATIENT_CLINIC_OR_DEPARTMENT_OTHER): Payer: Medicare PPO | Admitting: Student in an Organized Health Care Education/Training Program

## 2020-03-09 ENCOUNTER — Other Ambulatory Visit: Payer: Self-pay

## 2020-03-09 DIAGNOSIS — G894 Chronic pain syndrome: Secondary | ICD-10-CM | POA: Insufficient documentation

## 2020-03-09 DIAGNOSIS — M47816 Spondylosis without myelopathy or radiculopathy, lumbar region: Secondary | ICD-10-CM

## 2020-03-09 MED ORDER — DEXAMETHASONE SODIUM PHOSPHATE 10 MG/ML IJ SOLN
10.0000 mg | Freq: Once | INTRAMUSCULAR | Status: AC
  Administered 2020-03-09: 10 mg

## 2020-03-09 MED ORDER — DEXAMETHASONE SODIUM PHOSPHATE 10 MG/ML IJ SOLN
INTRAMUSCULAR | Status: AC
  Filled 2020-03-09: qty 2

## 2020-03-09 MED ORDER — LIDOCAINE HCL 2 % IJ SOLN
20.0000 mL | Freq: Once | INTRAMUSCULAR | Status: AC
  Administered 2020-03-09: 100 mg
  Filled 2020-03-09: qty 20

## 2020-03-09 MED ORDER — DEXAMETHASONE SODIUM PHOSPHATE 10 MG/ML IJ SOLN
20.0000 mg | Freq: Once | INTRAMUSCULAR | Status: AC
  Administered 2020-03-09: 10 mg

## 2020-03-09 MED ORDER — LIDOCAINE HCL URETHRAL/MUCOSAL 2 % EX GEL
CUTANEOUS | Status: AC
  Filled 2020-03-09: qty 10

## 2020-03-09 MED ORDER — ROPIVACAINE HCL 2 MG/ML IJ SOLN
INTRAMUSCULAR | Status: AC
  Filled 2020-03-09: qty 20

## 2020-03-09 MED ORDER — ROPIVACAINE HCL 2 MG/ML IJ SOLN
9.0000 mL | Freq: Once | INTRAMUSCULAR | Status: AC
  Administered 2020-03-09: 9 mL via PERINEURAL

## 2020-03-09 NOTE — Progress Notes (Signed)
Safety precautions to be maintained throughout the outpatient stay will include: orient to surroundings, keep bed in low position, maintain call bell within reach at all times, provide assistance with transfer out of bed and ambulation.  

## 2020-03-09 NOTE — Patient Instructions (Signed)
Pain Management Discharge Instructions  General Discharge Instructions :  If you need to reach your doctor call: Monday-Friday 8:00 am - 4:00 pm at [phone] or toll free [phone].  After clinic hours [phone] to have operator reach doctor.  Bring all of your medication bottles to all your appointments in the pain clinic.  To cancel or reschedule your appointment with Pain Management please remember to call 24 hours in advance to avoid a fee.  Refer to the educational materials which you have been given on: General Risks, I had my Procedure. Discharge Instructions, Post Sedation.  Post Procedure Instructions:  The drugs you were given will stay in your system until tomorrow, so for the next 24 hours you should not drive, make any legal decisions or drink any alcoholic beverages.  You may eat anything you prefer, but it is better to start with liquids then soups and crackers, and gradually work up to solid foods.  Please notify your doctor immediately if you have any unusual bleeding, trouble breathing or pain that is not related to your normal pain.  Depending on the type of procedure that was done, some parts of your body may feel week and/or numb.  This usually clears up by tonight or the next day.  Walk with the use of an assistive device or accompanied by an adult for the 24 hours.  You may use ice on the affected area for the first 24 hours.  Put ice in a Ziploc bag and cover with a towel and place against area 15 minutes on 15 minutes off.  You may switch to heat after 24 hours.Facet Blocks Patient Information  Description: The facets are joints in the spine between the vertebrae.  Like any joints in the body, facets can become irritated and painful.  Arthritis can also effect the facets.  By injecting steroids and local anesthetic in and around these joints, we can temporarily block the nerve supply to them.  Steroids act directly on irritated nerves and tissues to  reduce selling and inflammation which often leads to decreased pain.  Facet blocks may be done anywhere along the spine from the neck to the low back depending upon the location of your pain.   After numbing the skin with local anesthetic (like Novocaine), a small needle is passed onto the facet joints under x-ray guidance.  You may experience a sensation of pressure while this is being done.  The entire block usually lasts about 15-25 minutes.   Conditions which may be treated by facet blocks:   Low back/buttock pain  Neck/shoulder pain  Certain types of headaches  Preparation for the injection:  1. Do not eat any solid food or dairy products within 8 hours of your appointment. 2. You may drink clear liquid up to 3 hours before appointment.  Clear liquids include water, black coffee, juice or soda.  No milk or cream please. 3. You may take your regular medication, including pain medications, with a sip of water before your appointment.  Diabetics should hold regular insulin (if taken separately) and take 1/2 normal NPH dose the morning of the procedure.  Carry some sugar containing items with you to your appointment. 4. A driver must accompany you and be prepared to drive you home after your procedure. 5. Bring all your current medications with you. 6. An IV may be inserted and sedation may be given at the discretion of the physician. 7. A blood pressure cuff, EKG and other monitors will often be   applied during the procedure.  Some patients may need to have extra oxygen administered for a short period. 8. You will be asked to provide medical information, including your allergies and medications, prior to the procedure.  We must know immediately if you are taking blood thinners (like Coumadin/Warfarin) or if you are allergic to IV iodine contrast (dye).  We must know if you could possible be pregnant.  Possible side-effects:   Bleeding from needle site  Infection (rare, may require  surgery)  Nerve injury (rare)  Numbness & tingling (temporary)  Difficulty urinating (rare, temporary)  Spinal headache (a headache worse with upright posture)  Light-headedness (temporary)  Pain at injection site (serveral days)  Decreased blood pressure (rare, temporary)  Weakness in arm/leg (temporary)  Pressure sensation in back/neck (temporary)   Call if you experience:   Fever/chills associated with headache or increased back/neck pain  Headache worsened by an upright position  New onset, weakness or numbness of an extremity below the injection site  Hives or difficulty breathing (go to the emergency room)  Inflammation or drainage at the injection site(s)  Severe back/neck pain greater than usual  New symptoms which are concerning to you  Please note:  Although the local anesthetic injected can often make your back or neck feel good for several hours after the injection, the pain will likely return. It takes 3-7 days for steroids to work.  You may not notice any pain relief for at least one week.  If effective, we will often do a series of 2-3 injections spaced 3-6 weeks apart to maximally decrease your pain.  After the initial series, you may be a candidate for a more permanent nerve block of the facets.  If you have any questions, please call [phone] [facility] 

## 2020-03-09 NOTE — Progress Notes (Signed)
PROVIDER NOTE: Information contained herein reflects review and annotations entered in association with encounter. Interpretation of such information and data should be left to medically-trained personnel. Information provided to patient can be located elsewhere in the medical record under "Patient Instructions". Document created using STT-dictation technology, any transcriptional errors that may result from process are unintentional.    Patient: Mackenzie Bonilla  Service Category: Procedure  Provider: [name], MD  DOB: Jan 19, 1966  DOS: 03/09/2020  Location: [facility]  MRN: [id number]  Setting: Ambulatory - outpatient  Referring Provider: [name], MD  Type: Established Patient  Specialty: Interventional Pain Management  PCP: Abner Greenspan, MD   Primary Reason for Visit: Interventional Pain Management Treatment. CC: Back Pain (Low, right is worse)  Procedure:          Anesthesia, Analgesia, Anxiolysis:  Type: Lumbar Facet, Medial Branch Block(s) #2  Primary Purpose: Diagnostic Region: Posterolateral Lumbosacral Spine Level:L3, L4, L5,  Medial Branch Level(s). Injecting these levels blocks the L3-4, L4-5, lumbar facet joints. Laterality: Bilateral  Type: Local Anesthesia  Local Anesthetic: Lidocaine 1-2%  Position: Prone   Indications: 1. Lumbar facet arthropathy (severe right L4-L5)   2. Spondylosis without myelopathy or radiculopathy, lumbar region   3. Chronic pain syndrome    Pain Score: Pre-procedure: 6 /10 Post-procedure: 0-No pain/10   Pre-op H&P Assessment:  Mackenzie Bonilla is a 55 y.o. (year old), female patient, seen today for interventional treatment. She  has a past surgical history that includes Tonsillectomy; Septoplasty; Foot surgery; uterine tumor (09/2001); Vagus nerve stimulator insertion; Endometrial biopsy (01/2004); Uterine fibroid surgery (06/2005); Colonoscopy (08/12/2012); Esophagogastroduodenoscopy; Breast lumpectomy with radioactive  seed localization (Right, 08/02/2014); Breast biopsy (Right, 2016); and Esophagogastroduodenoscopy (egd) with propofol (N/A, 08/18/2019). Mackenzie Bonilla has a current medication list which includes the following prescription(s): accu-chek aviva plus, accu-chek softclix lancets, acetaminophen, albuterol, alprazolam, accu-chek aviva, fifty50 glucose meter 2.0, bupropion, cyclobenzaprine, desvenlafaxine, dimenhydrinate, fluticasone, gabapentin, gabapentin, glipizide, ibuprofen, insulin degludec, losartan, magnesium, nystatin cream, omeprazole, oxybutynin, polyethyl glycol-propyl glycol, pravastatin, simethicone, spironolactone, tramadol, trazodone, trulicity, zinc oxide, and lantus solostar. Her primarily concern today is the Back Pain (Low, right is worse)  Initial Vital Signs:  Pulse/HCG Rate: 78ECG Heart Rate: 67 Temp: (!) 97 F (36.1 C) Resp: 18 BP: 110/73 SpO2: 94 %  BMI: Estimated body mass index is 56.47 kg/m as calculated from the following:   Height as of this encounter: 5\' 4"  (1.626 m).   Weight as of this encounter: 329 lb (149.2 kg).  Risk Assessment: Allergies: Reviewed. She is allergic to cephalexin, codeine, duloxetine, levaquin [levofloxacin], lisinopril, metformin and related, quetiapine, sertraline hcl, triazolam, zaleplon, zocor [simvastatin - high dose], zolpidem tartrate, hydrocodone, and norelgestromin-eth estradiol.  Allergy Precautions: None required Coagulopathies: Reviewed. None identified.  Blood-thinner therapy: None at this time Active Infection(s): Reviewed. None identified. Mackenzie Bonilla is afebrile  Site Confirmation: Mackenzie Bonilla was asked to confirm the procedure and laterality before marking the site Procedure checklist: Completed Consent: Before the procedure and under the influence of no sedative(s), amnesic(s), or anxiolytics, the patient was informed of the treatment options, risks and possible complications. To fulfill our ethical and legal obligations, as  recommended by the American Medical Association's Code of Ethics, I have informed the patient of my clinical impression; the nature and purpose of the treatment or procedure; the risks, benefits, and possible complications of the intervention; the alternatives, including doing nothing; the risk(s) and benefit(s) of the alternative treatment(s) or procedure(s); and the risk(s) and benefit(s) of  doing nothing. The patient was provided information about the general risks and possible complications associated with the procedure. These may include, but are not limited to: failure to achieve desired goals, infection, bleeding, organ or nerve damage, allergic reactions, paralysis, and death. In addition, the patient was informed of those risks and complications associated to Spine-related procedures, such as failure to decrease pain; infection (i.e.: Meningitis, epidural or intraspinal abscess); bleeding (i.e.: epidural hematoma, subarachnoid hemorrhage, or any other type of intraspinal or peri-dural bleeding); organ or nerve damage (i.e.: Any type of peripheral nerve, nerve root, or spinal cord injury) with subsequent damage to sensory, motor, and/or autonomic systems, resulting in permanent pain, numbness, and/or weakness of one or several areas of the body; allergic reactions; (i.e.: anaphylactic reaction); and/or death. Furthermore, the patient was informed of those risks and complications associated with the medications. These include, but are not limited to: allergic reactions (i.e.: anaphylactic or anaphylactoid reaction(s)); adrenal axis suppression; blood sugar elevation that in diabetics may result in ketoacidosis or comma; water retention that in patients with history of congestive heart failure may result in shortness of breath, pulmonary edema, and decompensation with resultant heart failure; weight gain; swelling or edema; medication-induced neural toxicity; particulate matter embolism and blood vessel  occlusion with resultant organ, and/or nervous system infarction; and/or aseptic necrosis of one or more joints. Finally, the patient was informed that Medicine is not an exact science; therefore, there is also the possibility of unforeseen or unpredictable risks and/or possible complications that may result in a catastrophic outcome. The patient indicated having understood very clearly. We have given the patient no guarantees and we have made no promises. Enough time was given to the patient to ask questions, all of which were answered to the patient's satisfaction. Mackenzie Bonilla has indicated that she wanted to continue with the procedure. Attestation: I, the ordering provider, attest that I have discussed with the patient the benefits, risks, side-effects, alternatives, likelihood of achieving goals, and potential problems during recovery for the procedure that I have provided informed consent. Date  Time: 03/09/2020  8:45 AM  Pre-Procedure Preparation:  Monitoring: As per clinic protocol. Respiration, ETCO2, SpO2, BP, heart rate and rhythm monitor placed and checked for adequate function Safety Precautions: Patient was assessed for positional comfort and pressure points before starting the procedure. Time-out: I initiated and conducted the "Time-out" before starting the procedure, as per protocol. The patient was asked to participate by confirming the accuracy of the "Time Out" information. Verification of the correct person, site, and procedure were performed and confirmed by me, the nursing staff, and the patient. "Time-out" conducted as per Joint Commission's Universal Protocol (UP.01.01.01). Time: [phone]  Description of Procedure:          Laterality: Bilateral. The procedure was performed in identical fashion on both sides. Levels:  L3, L4, L5,  Medial Branch Level(s) Area Prepped: Posterior Lumbosacral Region DuraPrep (Iodine Povacrylex [0.7% available iodine] and Isopropyl Alcohol, 74%  w/w) Safety Precautions: Aspiration looking for blood return was conducted prior to all injections. At no point did we inject any substances, as a needle was being advanced. Before injecting, the patient was told to immediately notify me if she was experiencing any new onset of "ringing in the ears, or metallic taste in the mouth". No attempts were made at seeking any paresthesias. Safe injection practices and needle disposal techniques used. Medications properly checked for expiration dates. SDV (single dose vial) medications used. After the completion of the procedure, all disposable equipment  used was discarded in the proper designated medical waste containers. Local Anesthesia: Protocol guidelines were followed. The patient was positioned over the fluoroscopy table. The area was prepped in the usual manner. The time-out was completed. The target area was identified using fluoroscopy. A 12-in long, straight, sterile hemostat was used with fluoroscopic guidance to locate the targets for each level blocked. Once located, the skin was marked with an approved surgical skin marker. Once all sites were marked, the skin (epidermis, dermis, and hypodermis), as well as deeper tissues (fat, connective tissue and muscle) were infiltrated with a small amount of a short-acting local anesthetic, loaded on a 10cc syringe with a 25G, 1.5-in  Needle. An appropriate amount of time was allowed for local anesthetics to take effect before proceeding to the next step. Local Anesthetic: Lidocaine 2.0% The unused portion of the local anesthetic was discarded in the proper designated containers. Technical explanation of process:  L3 Medial Branch Nerve Block (MBB): The target area for the L3 medial branch is at the junction of the postero-lateral aspect of the superior articular process and the superior, posterior, and medial edge of the transverse process of L4. Under fluoroscopic guidance, a Quincke needle was inserted until  contact was made with os over the superior postero-lateral aspect of the pedicular shadow (target area). After negative aspiration for blood,2  mL of the nerve block solution was injected without difficulty or complication. The needle was removed intact. L4 Medial Branch Nerve Block (MBB): The target area for the L4 medial branch is at the junction of the postero-lateral aspect of the superior articular process and the superior, posterior, and medial edge of the transverse process of L5. Under fluoroscopic guidance, a Quincke needle was inserted until contact was made with os over the superior postero-lateral aspect of the pedicular shadow (target area). After negative aspiration for blood,64mL of the nerve block solution was injected without difficulty or complication. The needle was removed intact. L5 Medial Branch Nerve Block (MBB): The target area for the L5 medial branch is at the junction of the postero-lateral aspect of the superior articular process and the superior, posterior, and medial edge of the sacral ala. Under fluoroscopic guidance, a Quincke needle was inserted until contact was made with os over the superior postero-lateral aspect of the pedicular shadow (target area). After negative aspiration for blood, 72mL of the nerve block solution was injected without difficulty or complication. The needle was removed intact.  Nerve block solution: 12 cc solution made of 10 cc of 0.2% ropivacaine, 2 cc of Decadron 10 mg/cc.  2cc injected at each level above bilaterally.  The unused portion of the solution was discarded in the proper designated containers. Procedural Needles: 22-gauge, 3.5-inch, Quincke needles used for all levels.  Once the entire procedure was completed, the treated area was cleaned, making sure to leave some of the prepping solution back to take advantage of its long term bactericidal properties.   Illustration of the posterior view of the lumbar spine and the posterior neural  structures. Laminae of L2 through S1 are labeled. DPRL5, dorsal primary ramus of L5; DPRS1, dorsal primary ramus of S1; DPR3, dorsal primary ramus of L3; FJ, facet (zygapophyseal) joint L3-L4; I, inferior articular process of L4; LB1, lateral branch of dorsal primary ramus of L1; IAB, inferior articular branches from L3 medial branch (supplies L4-L5 facet joint); IBP, intermediate branch plexus; MB3, medial branch of dorsal primary ramus of L3; NR3, third lumbar nerve root; S, superior articular process of L5;  SAB, superior articular branches from L4 (supplies L4-5 facet joint also); TP3, transverse process of L3.  Vitals:   03/09/20 0859 03/09/20 0940 03/09/20 0945 03/09/20 0950  BP: 110/73 137/77 126/78 133/71  Pulse: 78     Resp: 18 18 18 18   Temp: (!) 97 F (36.1 C)     TempSrc: Temporal     SpO2: 94% 95% 94% 95%  Weight: (!) 329 lb (149.2 kg)     Height: 5\' 4"  (1.626 m)        Start Time: 0941 hrs. End Time: 0953 hrs.  Imaging Guidance (Spinal):          Type of Imaging Technique: Fluoroscopy Guidance (Spinal) Indication(s): Assistance in needle guidance and placement for procedures requiring needle placement in or near specific anatomical locations not easily accessible without such assistance. Exposure Time: Please see nurses notes. Contrast: None used. Fluoroscopic Guidance: I was personally present during the use of fluoroscopy. "Tunnel Vision Technique" used to obtain the best possible view of the target area. Parallax error corrected before commencing the procedure. "Direction-depth-direction" technique used to introduce the needle under continuous pulsed fluoroscopy. Once target was reached, antero-posterior, oblique, and lateral fluoroscopic projection used confirm needle placement in all planes. Images permanently stored in EMR. Interpretation: No contrast injected. I personally interpreted the imaging intraoperatively. Adequate needle placement confirmed in multiple planes.  Permanent images saved into the patient's record.   Post-operative Assessment:  Post-procedure Vital Signs:  Pulse/HCG Rate: 7868 Temp: (!) 97 F (36.1 C) Resp: 18 BP: 133/71 SpO2: 95 %  EBL: None  Complications: No immediate post-treatment complications observed by team, or reported by patient.  Note: The patient tolerated the entire procedure well. A repeat set of vitals were taken after the procedure and the patient was kept under observation following institutional policy, for this type of procedure. Post-procedural neurological assessment was performed, showing return to baseline, prior to discharge. The patient was provided with post-procedure discharge instructions, including a section on how to identify potential problems. Should any problems arise concerning this procedure, the patient was given instructions to immediately contact us, at any time, without hesitation. In any case, we plan to contact the patient by telephone for a follow-up status report regarding this interventional procedure.  Comments:  No additional relevant information.  5 out of 5 strength bilateral lower extremity: Plantar flexion, dorsiflexion, knee flexion, knee extension.   Plan of Care  Orders:  Orders Placed This Encounter  Procedures  . DG PAIN CLIN[facility] C-ARM 1-60 MIN NO REPORT    Intraoperative interpretation by procedural physician at [facility].    Standing Status:   Standing    Number of Occurrences:   1    Order Specific Question:   Reason for exam:    Answer:   Assistance in needle guidance and placement for procedures requiring needle placement in or near specific anatomical locations not easily accessible without such assistance.    Medications ordered for procedure: Meds ordered this encounter  Medications  . lidocaine (XYLOCAINE) 2 % (with pres) injection 400 mg  . ropivacaine (PF) 2 mg/mL (0.2%) (NAROPIN) injection 9 mL  . ropivacaine (PF) 2 mg/mL (0.2%) (NAROPIN)  injection 9 mL  . dexamethasone (DECADRON) injection 10 mg  . dexamethasone (DECADRON) injection 20 mg   Medications administered: We administered lidocaine, ropivacaine (PF) 2 mg/mL (0.2%), ropivacaine (PF) 2 mg/mL (0.2%), dexamethasone, and dexamethasone.  See the medical record for exact dosing, route, and time of administration.  Follow-up plan:  Return in about 4 weeks (around 04/06/2020) for Post Procedure Evaluation, virtual.      Status post bilateral L3, L4, L5 facet medial branch nerve blocks 01/04/2020 (65% pain relief for 1 month), 03/09/2020.   Recent Visits Date Type Provider Dept  02/24/20 Telemedicine [name], MD [facility]  02/03/20 Procedure visit [name], MD [facility]  01/04/20 Procedure visit [name], MD [facility]  12/29/19 Office Visit [name], MD [facility]  Showing recent visits within past 90 days and meeting all other requirements Today's Visits Date Type Provider Dept  03/09/20 Procedure visit [name], MD [facility]  Showing today's visits and meeting all other requirements Future Appointments Date Type Provider Dept  04/06/20 Appointment [name], MD [facility]  Showing future appointments within next 90 days and meeting all other requirements  Disposition: Discharge home  Discharge (Date  Time): 03/09/2020; 1005 hrs.   Primary Care Physician: Tower, Wynelle Fanny, MD Location: [facility] Note by: [name], MD Date: 03/09/2020; Time: 10:30 AM  Disclaimer:  Medicine is not an exact science. The only guarantee in medicine is that nothing is guaranteed. It is important to note that the decision to proceed with this intervention was based on the information collected from the patient. The Data and conclusions were drawn from the patient's questionnaire, the interview, and the physical examination. Because the information was  provided in large part by the patient, it cannot be guaranteed that it has not been purposely or unconsciously manipulated. Every effort has been made to obtain as much relevant data as possible for this evaluation. It is important to note that the conclusions that lead to this procedure are derived in large part from the available data. Always take into account that the treatment will also be dependent on availability of resources and existing treatment guidelines, considered by other Pain Management Practitioners as being common knowledge and practice, at the time of the intervention. For Medico-Legal purposes, it is also important to point out that variation in procedural techniques and pharmacological choices are the acceptable norm. The indications, contraindications, technique, and results of the above procedure should only be interpreted and judged by a Board-Certified Interventional Pain Specialist with extensive familiarity and expertise in the same exact procedure and technique.

## 2020-03-09 NOTE — Telephone Encounter (Signed)
Has appt today

## 2020-03-10 ENCOUNTER — Telehealth: Payer: Self-pay

## 2020-03-10 NOTE — Telephone Encounter (Signed)
Patient states she was jittery last night and couldn't sleep.  Instructed her that it was probably the steroid. Instructed to use heat today and to call if needed.

## 2020-04-05 ENCOUNTER — Other Ambulatory Visit: Payer: Self-pay | Admitting: Family Medicine

## 2020-04-05 ENCOUNTER — Telehealth: Payer: Self-pay

## 2020-04-05 NOTE — Telephone Encounter (Signed)
Lm for patinet to call office for pre virtual appointment questions.

## 2020-04-06 ENCOUNTER — Other Ambulatory Visit: Payer: Self-pay

## 2020-04-06 ENCOUNTER — Ambulatory Visit
Payer: Medicare PPO | Attending: Student in an Organized Health Care Education/Training Program | Admitting: Student in an Organized Health Care Education/Training Program

## 2020-04-06 DIAGNOSIS — M47816 Spondylosis without myelopathy or radiculopathy, lumbar region: Secondary | ICD-10-CM

## 2020-04-06 NOTE — Progress Notes (Signed)
I attempted to call the patient however no response. Voicemail left instructing patient to call front desk office at [phone] to reschedule appointment. -Dr Abygale Karpf  

## 2020-04-07 ENCOUNTER — Other Ambulatory Visit: Payer: Self-pay

## 2020-04-07 ENCOUNTER — Ambulatory Visit
Payer: Medicare PPO | Attending: Student in an Organized Health Care Education/Training Program | Admitting: Student in an Organized Health Care Education/Training Program

## 2020-04-07 DIAGNOSIS — M5136 Other intervertebral disc degeneration, lumbar region: Secondary | ICD-10-CM

## 2020-04-07 DIAGNOSIS — G894 Chronic pain syndrome: Secondary | ICD-10-CM

## 2020-04-07 DIAGNOSIS — M47816 Spondylosis without myelopathy or radiculopathy, lumbar region: Secondary | ICD-10-CM

## 2020-04-07 DIAGNOSIS — M545 Low back pain, unspecified: Secondary | ICD-10-CM

## 2020-04-07 NOTE — Progress Notes (Signed)
I attempted to call the patient (x3) however no response. No option for voicemail. Please schedule for F2F -Dr Holley Raring    Post-Procedure Evaluation  Procedure (03/09/2020):   Type: Lumbar Facet, Medial Branch Block(s) #2  Primary Purpose: Diagnostic Region: Posterolateral Lumbosacral Spine Level:L3, L4, L5,  Medial Branch Level(s). Injecting these levels blocks the L3-4, L4-5, lumbar facet joints. Laterality: Bilateral  Sedation: Please see nurses note.  Effectiveness during initial hour after procedure(Ultra-Short Term Relief): 100 %  Local anesthetic used: Long-acting (4-6 hours) Effectiveness: Defined as any analgesic benefit obtained secondary to the administration of local anesthetics. This carries significant diagnostic value as to the etiological location, or anatomical origin, of the pain. Duration of benefit is expected to coincide with the duration of the local anesthetic used.  Effectiveness during initial 4-6 hours after procedure(Short-Term Relief): 100 %   Long-term benefit: Defined as any relief past the pharmacologic duration of the local anesthetics.  Effectiveness past the initial 6 hours after procedure(Long-Term Relief): 50 %  (3 weeks)  Recommend RFA given 65 % pain relief for 2 weeks, then reduced to 50% for 3 weeks and is still at 50% approx

## 2020-04-18 ENCOUNTER — Telehealth: Payer: Self-pay

## 2020-04-18 NOTE — Telephone Encounter (Signed)
Pt called to report she has had dark, cloudy urine for several days and is worse in the morning and evening. Denies dysuria or hematuria. Pt reports she has not been drinking very much water because it causes her to get up too much at night. Advised pt she could have a UTI but also could be dehydrated causing the urine to be dark and cloudy. Pt requested an apt with her PCP but PCP does not have apts today and is out of the office tomorrow. Scheduled pt with another provider for tomorrow. Advised to increase water intake and if any symptoms changed to contact the office. Pt verbalized understanding.

## 2020-04-19 ENCOUNTER — Ambulatory Visit: Payer: Medicare PPO | Admitting: Primary Care

## 2020-04-19 ENCOUNTER — Other Ambulatory Visit: Payer: Self-pay

## 2020-04-19 ENCOUNTER — Encounter: Payer: Self-pay | Admitting: Primary Care

## 2020-04-19 VITALS — BP 120/74 | HR 82 | Temp 98.1°F | Ht 64.0 in | Wt 331.0 lb

## 2020-04-19 DIAGNOSIS — R3129 Other microscopic hematuria: Secondary | ICD-10-CM | POA: Diagnosis not present

## 2020-04-19 DIAGNOSIS — R8290 Unspecified abnormal findings in urine: Secondary | ICD-10-CM | POA: Diagnosis not present

## 2020-04-19 LAB — POC URINALSYSI DIPSTICK (AUTOMATED)
Bilirubin, UA: NEGATIVE
Blood, UA: POSITIVE
Glucose, UA: NEGATIVE
Nitrite, UA: POSITIVE
Protein, UA: POSITIVE — AB
Spec Grav, UA: 1.025 (ref 1.010–1.025)
Urobilinogen, UA: 0.2 E.U./dL
pH, UA: 5 (ref 5.0–8.0)

## 2020-04-19 NOTE — Assessment & Plan Note (Signed)
25-month history of UTI symptoms. Prior history of UAs in 2020 with 2 - urine culture results.  Interestingly enough UA today appears exactly like prior UAs ended up being culture negative.  UA today with 3+ leuks, positive nitrites, 3+ blood Culture sent today.  Hold off on treatment until results return. We will also obtain urine microscopic.  If culture is negative, then recommend urology referral for evaluation of microscopic hematuria along with urinary symptoms.

## 2020-04-19 NOTE — Addendum Note (Signed)
Addended by: Francella Solian on: 04/19/2020 02:46 PM   Modules accepted: Orders

## 2020-04-19 NOTE — Patient Instructions (Signed)
We will be in touch with you regarding your urine results.   Ensure you are consuming 64 ounces of water daily.  It was a pleasure to see you today!

## 2020-04-19 NOTE — Progress Notes (Signed)
Subjective:    Patient ID: Mackenzie Bonilla, female    DOB: [birth year], 55 y.o.   MRN: [id number]  HPI  This visit occurred during the SARS-CoV-2 public health emergency.  Safety protocols were in place, including screening questions prior to the visit, additional usage of staff PPE, and extensive cleaning of exam room while observing appropriate contact time as indicated for disinfecting solutions.   Ms. Dockham is a 55 year old female patient of Dr. Glori Bickers with a medical history of type 2 diabetes, diabetic neuropathy, acute cystitis, chronic back pain, fibromyalgia, depression, anxiety, cystocele who presents today with a chief complaint of urinary frequency.  She also reports darker color to her urine and a foul odor.  Symptoms began about two months ago for which has slowly progressed. She's overall noticed improvement in symptoms as of today. She is compliant to oxybutynin for bladder spasms which has helped.  She denies fevers, hematuria. She typically drinks a decent amount of water, hasn't had much water intake consistently. She has a history of culture negative urinary symptoms, February and March 2020.   BP Readings from Last 3 Encounters:  04/19/20 120/74  03/09/20 133/71  02/03/20 (!) 157/74     Review of Systems  Constitutional: Negative for fever.  Genitourinary: Positive for frequency. Negative for hematuria and vaginal discharge.       Past Medical History:  Diagnosis Date  . Allergy    allergic rhinitis  . Anemia   . Anxiety   . Arthritis    osteoarthritis  . Asthma   . Claustrophobia    does not like oxygen mask covering face  . Depression   . Diabetes mellitus without complication ([place])   . Fall 2016  . Fatty liver 07/2008   abd. ultrasound - fatty liver ; slt dilated cbd (no stones ) 06/10// abd.  ultrasound normal on 04/2006  . Fibromyalgia   . GERD (gastroesophageal reflux disease)    EGD negative 06/2001// EGD erythematous mucosa, polyp 08/2008   . High uric acid in 24 hour urine specimen   . Hyperhidrosis   . Hyperlipidemia   . Kidney stones   . Lymphedema   . Obesity   . PCOS (polycystic ovarian syndrome)   . Shortness of breath dyspnea   . Tachycardia   . TMJ (dislocation of temporomandibular joint)   . UTI (lower urinary tract infection)      Social History   Socioeconomic History  . Marital status: Single    Spouse name: Not on file  . Number of children: Not on file  . Years of education: Not on file  . Highest education level: Not on file  Occupational History  . Not on file  Tobacco Use  . Smoking status: Never Smoker  . Smokeless tobacco: Never Used  Vaping Use  . Vaping Use: Never used  Substance and Sexual Activity  . Alcohol use: No    Alcohol/week: 0.0 standard drinks  . Drug use: No  . Sexual activity: Not Currently    Birth control/protection: None, Post-menopausal  Other Topics Concern  . Not on file  Social History Narrative  . Not on file   Social Determinants of Health   Financial Resource Strain: Not on file  Food Insecurity: Not on file  Transportation Needs: Not on file  Physical Activity: Not on file  Stress: Not on file  Social Connections: Not on file  Intimate Partner Violence: Not on file    Past Surgical History:  Procedure Laterality Date  . BREAST BIOPSY Right 2016   neg  . BREAST LUMPECTOMY WITH RADIOACTIVE SEED LOCALIZATION Right 08/02/2014   Procedure: BREAST LUMPECTOMY WITH RADIOACTIVE SEED LOCALIZATION;  Surgeon: Rolm Bookbinder, MD;  Location: [facility];  Service: General;  Laterality: Right;  . COLONOSCOPY  08/12/2012   Completed by Dr. Phylis Bougie, normal exam.   . ENDOMETRIAL BIOPSY  01/2004  . ESOPHAGOGASTRODUODENOSCOPY    . ESOPHAGOGASTRODUODENOSCOPY (EGD) WITH PROPOFOL N/A 08/18/2019   Procedure: ESOPHAGOGASTRODUODENOSCOPY (EGD) WITH PROPOFOL;  Surgeon: Lucilla Lame, MD;  Location: [facility] ENDOSCOPY;  Service: Endoscopy;  Laterality: N/A;  . FOOT SURGERY    .  SEPTOPLASTY     two times  . TONSILLECTOMY    . UTERINE FIBROID SURGERY  06/2005   ablation  . uterine tumor  09/2001  . VAGUS NERVE STIMULATOR INSERTION      Family History  Problem Relation Age of Onset  . Hypertension Father   . Cancer Father        pancreatic cancer  . Hypertension Sister   . Heart disease Maternal Grandmother 60       MI  . Diabetes Maternal Grandmother   . Heart disease Paternal Grandmother 73       MI  . Diabetes Paternal Grandmother   . Breast cancer Neg Hx     Allergies  Allergen Reactions  . Cephalexin   . Codeine     rash  . Duloxetine   . Levaquin [Levofloxacin] Nausea Only  . Lisinopril Cough  . Metformin And Related Diarrhea    GI side effects   . Quetiapine Other (See Comments)  . Sertraline Hcl     REACTION: vivid dreams  . Triazolam     REACTION: ? reaction  . Zaleplon   . Zocor [Simvastatin - High Dose]     achey  . Zolpidem Tartrate     hallucinations  . Hydrocodone Itching and Rash    REACTION: redness rash itching  . Norelgestromin-Eth Estradiol     Patch didn't stick to skin    Current Outpatient Medications on File Prior to Visit  Medication Sig Dispense Refill  . ACCU-CHEK AVIVA PLUS test strip Use to check blood sugar  once a day 100 each 0  . ACCU-CHEK SOFTCLIX LANCETS lancets Use to check blood sugar  once a day 100 each 0  . acetaminophen (TYLENOL) 500 MG tablet Take 500 mg by mouth every 6 (six) hours as needed.     Marland Kitchen albuterol (VENTOLIN HFA) 108 (90 Base) MCG/ACT inhaler Inhale 2 puffs into the lungs every 6 (six) hours as needed for wheezing. 18 g 3  . ALPRAZolam (XANAX) 1 MG tablet Take 2 mg by mouth 3 (three) times daily as needed.    . Blood Glucose Calibration (ACCU-CHEK AVIVA) SOLN     . Blood Glucose Monitoring Suppl (FIFTY50 GLUCOSE METER 2.0) w/Device KIT Use as directed    . buPROPion (WELLBUTRIN XL) 150 MG 24 hr tablet Take 150 mg by mouth daily.    . cyclobenzaprine (FLEXERIL) 5 MG tablet TAKE ONE  TABLET 3 TIMES DAILY AS NEEDED FOR MUSCLE SPASM 30 tablet 3  . desvenlafaxine (PRISTIQ) 100 MG 24 hr tablet     . dimenhyDRINATE (DRAMAMINE) 50 MG tablet Take 50 mg by mouth every 8 (eight) hours as needed.    . fluticasone (FLONASE) 50 MCG/ACT nasal spray Place into both nostrils daily.    Marland Kitchen gabapentin (NEURONTIN) 100 MG capsule Take 1 capsule (100 mg total)  by mouth 2 (two) times daily. (Patient taking differently: Take 100 mg by mouth daily. 100 mg in the morning, 300 mg at night) 360 capsule 3  . gabapentin (NEURONTIN) 300 MG capsule TAKE 1 CAPSULE AT BEDTIME 90 capsule 3  . glipiZIDE (GLUCOTROL XL) 10 MG 24 hr tablet Take 1 tablet by mouth daily.    Marland Kitchen ibuprofen (ADVIL) 600 MG tablet Take 1 tablet (600 mg total) by mouth every 8 (eight) hours as needed. 30 tablet 0  . Insulin Degludec (TRESIBA [place]) Inject into the skin. 1 injection nightly 8-10 pm    . losartan (COZAAR) 25 MG tablet     . Magnesium 500 MG CAPS Take 1 capsule (500 mg total) by mouth at bedtime. 180 capsule 0  . nystatin cream (MYCOSTATIN) Apply 1 application topically 3 (three) times daily as needed (for yeast infection areas). To affected areas 30 g 3  . omeprazole (PRILOSEC) 20 MG capsule TAKE 1 CAPSULE BY MOUTH TWICE DAILY 180 capsule 1  . oxybutynin (DITROPAN XL) 15 MG 24 hr tablet TAKE 1 TABLET BY MOUTH DAILY 90 tablet 1  . Polyethyl Glycol-Propyl Glycol (SYSTANE OP) Apply 1 drop to eye daily as needed.    . pravastatin (PRAVACHOL) 20 MG tablet TAKE ONE TABLET AT BEDTIME 30 tablet 2  . Simethicone (GAS-X PO) Take by mouth as needed.    Marland Kitchen spironolactone (ALDACTONE) 100 MG tablet TAKE ONE TABLET EVERY DAY 30 tablet 2  . traMADol (ULTRAM) 50 MG tablet TAKE ONE TABLET TWICE A DAY IF NEEDED FOR SEVERE PAIN 30 tablet 0  . traZODone (DESYREL) 150 MG tablet Take 300 mg by mouth at bedtime.     . TRULICITY 3 ES/9.2ZR SOPN 4.5 mg. INJECT 4.5MG (0.5ML) SUBQ EVERY 7 DAY    . zinc oxide (BALMEX) 11.3 % CREA cream Apply 1 application  topically 2 (two) times daily.    . insulin glargine (LANTUS SOLOSTAR) 100 UNIT/ML Solostar Pen 32 Units at bedtime. (Patient not taking: Reported on 02/23/2020)     No current facility-administered medications on file prior to visit.    BP 120/74   Pulse 82   Temp 98.1 F (36.7 C) (Temporal)   Ht 5' 4"  (1.626 m)   Wt (!) 331 lb (150.1 kg)   SpO2 97%   BMI 56.82 kg/m    Objective:   Physical Exam Constitutional:      Appearance: She is well-nourished.  Cardiovascular:     Rate and Rhythm: Normal rate and regular rhythm.  Pulmonary:     Effort: Pulmonary effort is normal.     Breath sounds: Normal breath sounds.  Abdominal:     Tenderness: There is no right CVA tenderness or left CVA tenderness.  Musculoskeletal:     Cervical back: Neck supple.  Skin:    General: Skin is warm and dry.  Psychiatric:        Mood and Affect: Mood and affect normal.            Assessment & Plan:

## 2020-04-19 NOTE — Telephone Encounter (Signed)
Noted and will evaluate.  

## 2020-04-20 ENCOUNTER — Telehealth: Payer: Self-pay

## 2020-04-20 ENCOUNTER — Encounter: Payer: Self-pay | Admitting: Student in an Organized Health Care Education/Training Program

## 2020-04-20 ENCOUNTER — Ambulatory Visit
Payer: Medicare PPO | Attending: Student in an Organized Health Care Education/Training Program | Admitting: Student in an Organized Health Care Education/Training Program

## 2020-04-20 ENCOUNTER — Other Ambulatory Visit (INDEPENDENT_AMBULATORY_CARE_PROVIDER_SITE_OTHER): Payer: Medicare PPO

## 2020-04-20 VITALS — BP 106/66 | HR 87 | Temp 97.3°F | Resp 18 | Ht 64.0 in | Wt 371.0 lb

## 2020-04-20 DIAGNOSIS — R3129 Other microscopic hematuria: Secondary | ICD-10-CM | POA: Diagnosis not present

## 2020-04-20 DIAGNOSIS — M47816 Spondylosis without myelopathy or radiculopathy, lumbar region: Secondary | ICD-10-CM | POA: Diagnosis not present

## 2020-04-20 DIAGNOSIS — G894 Chronic pain syndrome: Secondary | ICD-10-CM

## 2020-04-20 DIAGNOSIS — M545 Low back pain, unspecified: Secondary | ICD-10-CM

## 2020-04-20 NOTE — Progress Notes (Signed)
PROVIDER NOTE: Information contained herein reflects review and annotations entered in association with encounter. Interpretation of such information and data should be left to medically-trained personnel. Information provided to patient can be located elsewhere in the medical record under "Patient Instructions". Document created using STT-dictation technology, any transcriptional errors that may result from process are unintentional.    Patient: Mackenzie Bonilla  Service Category: E/M  Provider: [name], MD  DOB: 1965-08-20  DOS: 04/20/2020  Specialty: Interventional Pain Management  MRN: [id number]  Setting: Ambulatory outpatient  PCP: Abner Greenspan, MD  Type: Established Patient    Referring Provider: Abner Greenspan, MD  Location: Office  Delivery: Face-to-face     HPI  [name], a 55 y.o. year old female, is here today because of her Lumbar facet arthropathy [M47.816]. [name] primary complain today is Back Pain (Mid and lower) Last encounter: My last encounter with her was on 03/09/2020. Pertinent problems: [name] does not have any pertinent problems on file. Pain Assessment: Severity of Chronic pain is reported as a 8 /10. Location: Back Lower,Mid/denies. Onset: More than a month ago. Quality: Spasm. Timing: Intermittent. Modifying factor(s): rest. lying flat. Vitals:  height is 5' 4"  (1.626 m) and weight is 371 lb (168.3 kg) (abnormal). Her temperature is 97.3 F (36.3 C) (abnormal). Her blood pressure is 106/66 and her pulse is 87. Her respiration is 18 and oxygen saturation is 93%.   Reason for encounter: post-procedure assessment.    Procedure (03/09/2020):   Type:Lumbar Facet, Medial Branch Block(s) #2 Primary Purpose:Diagnostic Region:Posterolateral Lumbosacral Spine Level:L3, L4, L5, Medial Branch Level(s). Injecting these levels blocks the L3-4, L4-5, lumbar facet joints. Laterality:Bilateral  Sedation: Please see nurses note.   Effectiveness during initial hour after procedure(Ultra-Short Term Relief): 100 %  Local anesthetic used: Long-acting (4-6 hours) Effectiveness: Defined as any analgesic benefit obtained secondary to the administration of local anesthetics. This carries significant diagnostic value as to the etiological location, or anatomical origin, of the pain. Duration of benefit is expected to coincide with the duration of the local anesthetic used.  Effectiveness during initial 4-6 hours after procedure(Short-Term Relief): 100 %   Long-term benefit: Defined as any relief past the pharmacologic duration of the local anesthetics.  Effectiveness past the initial 6 hours after procedure(Long-Term Relief): 80 %  (3 weeks) with gradual return of pain thereafter  ROS  Constitutional: Denies any fever or chills Gastrointestinal: No reported hemesis, hematochezia, vomiting, or acute GI distress Musculoskeletal: Positive low back pain Neurological: No reported episodes of acute onset apraxia, aphasia, dysarthria, agnosia, amnesia, paralysis, loss of coordination, or loss of consciousness  Medication Review  ALPRAZolam, Accu-Chek Aviva, Accu-Chek Softclix Lancets, Dulaglutide, Fifty50 Glucose Meter 2.0, Insulin Degludec, Magnesium, Polyethyl Glycol-Propyl Glycol, Simethicone, acetaminophen, albuterol, buPROPion, cyclobenzaprine, desvenlafaxine, dimenhyDRINATE, fluticasone, gabapentin, glipiZIDE, glucose blood, ibuprofen, insulin glargine, losartan, nystatin cream, omeprazole, oxybutynin, pravastatin, spironolactone, traMADol, traZODone, and zinc oxide  History Review  Allergy: [name] is allergic to cephalexin, codeine, duloxetine, levaquin [levofloxacin], lisinopril, metformin and related, quetiapine, sertraline hcl, triazolam, zaleplon, zocor [simvastatin - high dose], zolpidem tartrate, hydrocodone, and norelgestromin-eth estradiol. Drug: [name]  reports no history of drug use. Alcohol:  reports no  history of alcohol use. Tobacco:  reports that she has never smoked. She has never used smokeless tobacco. Social: [name]  reports that she has never smoked. She has never used smokeless tobacco. She reports that she does not drink alcohol and does not use drugs. Medical:  has a  past medical history of Allergy, Anemia, Anxiety, Arthritis, Asthma, Claustrophobia, Depression, Diabetes mellitus without complication ([place]), Fall (2016), Fatty liver (07/2008), Fibromyalgia, GERD (gastroesophageal reflux disease), High uric acid in 24 hour urine specimen, Hyperhidrosis, Hyperlipidemia, Kidney stones, Lymphedema, Obesity, PCOS (polycystic ovarian syndrome), Shortness of breath dyspnea, Tachycardia, TMJ (dislocation of temporomandibular joint), and UTI (lower urinary tract infection). Surgical: [name]  has a past surgical history that includes Tonsillectomy; Septoplasty; Foot surgery; uterine tumor (09/2001); Vagus nerve stimulator insertion; Endometrial biopsy (01/2004); Uterine fibroid surgery (06/2005); Colonoscopy (08/12/2012); Esophagogastroduodenoscopy; Breast lumpectomy with radioactive seed localization (Right, 08/02/2014); Breast biopsy (Right, 2016); and Esophagogastroduodenoscopy (egd) with propofol (N/A, 08/18/2019). Family: family history includes Cancer in her father; Diabetes in her maternal grandmother and paternal grandmother; Heart disease (age of onset: 61) in her maternal grandmother; Heart disease (age of onset: 74) in her paternal grandmother; Hypertension in her father and sister.  Laboratory Chemistry Profile   Renal Lab Results  Component Value Date   BUN 15 11/12/2018   CREATININE 1.10 11/12/2018   BCR 10 05/21/2017   GFR 51.89 (L) 11/12/2018   GFRAA 72 05/21/2017   GFRNONAA 62 05/21/2017     Hepatic Lab Results  Component Value Date   AST 15 11/12/2018   ALT 19 11/12/2018   ALBUMIN 4.3 11/12/2018   ALKPHOS 70 11/12/2018   AMYLASE 34 09/05/2016   LIPASE 22.0  09/05/2016     Electrolytes Lab Results  Component Value Date   NA 140 11/12/2018   K 4.7 11/12/2018   CL 101 11/12/2018   CALCIUM 9.7 11/12/2018   PHOS 4.0 03/07/2009     Bone Lab Results  Component Value Date   VD25OH 26 (L) 11/14/2011   TESTOFREE 2.1 05/21/2017   TESTOSTERONE 12 05/21/2017     Inflammation (CRP: Acute Phase) (ESR: Chronic Phase) Lab Results  Component Value Date   ESRSEDRATE 31 (H) 11/12/2018   LATICACIDVEN 1.11 06/23/2015       Note: Above Lab results reviewed.  Physical Exam  General appearance: alert, cooperative and moderately obese Mental status: Alert, oriented x 3 (person, place, & time)       Respiratory: No evidence of acute respiratory distress Eyes: PERLA Vitals: BP 106/66   Pulse 87   Temp (!) 97.3 F (36.3 C)   Resp 18   Ht 5' 4"  (1.626 m)   Wt (!) 371 lb (168.3 kg)   SpO2 93%   BMI 63.68 kg/m  BMI: Estimated body mass index is 63.68 kg/m as calculated from the following:   Height as of this encounter: 5' 4"  (1.626 m).   Weight as of this encounter: 371 lb (168.3 kg). Ideal: Ideal body weight: 54.7 kg (120 lb 9.5 oz) Adjusted ideal body weight: 100.1 kg (220 lb 12.1 oz)  Lumbar Exam  Skin & Axial Inspection: No masses, redness, or swelling Alignment: Symmetrical Functional ROM: Pain restricted ROM       Stability: No instability detected Muscle Tone/Strength: Functionally intact. No obvious neuro-muscular anomalies detected. Sensory (Neurological): Musculoskeletal pain pattern Palpation: No palpable anomalies       Provocative Tests: Hyperextension/rotation test: (+) bilaterally for facet joint pain. Lumbar quadrant test (Kemp's test): (+) bilaterally for facet joint pain.  Gait & Posture Assessment  Ambulation: Limited Gait: Modified gait pattern (slower gait speed, wider stride width, and longer stance duration) associated with morbid obesity Posture: Difficulty standing up straight, due to pain   Lower Extremity  Exam    Side: Right lower extremity  Side:  Left lower extremity  Stability: No instability observed          Stability: No instability observed          Skin & Extremity Inspection: Skin color, temperature, and hair growth are WNL. No peripheral edema or cyanosis. No masses, redness, swelling, asymmetry, or associated skin lesions. No contractures.  Skin & Extremity Inspection: Skin color, temperature, and hair growth are WNL. No peripheral edema or cyanosis. No masses, redness, swelling, asymmetry, or associated skin lesions. No contractures.  Functional ROM: Pain restricted ROM for hip and knee joints Limited SLR (straight leg raise)  Functional ROM: Pain restricted ROM for hip and knee joints Limited SLR (straight leg raise)  Muscle Tone/Strength: Functionally intact. No obvious neuro-muscular anomalies detected.  Muscle Tone/Strength: Functionally intact. No obvious neuro-muscular anomalies detected.  Sensory (Neurological): Arthropathic arthralgia        Sensory (Neurological): Arthropathic arthralgia          Assessment   Status Diagnosis  Having a Flare-up Persistent Persistent 1. Lumbar facet arthropathy (severe right L4-L5)   2. Spondylosis without myelopathy or radiculopathy, lumbar region   3. Lumbar spine pain   4. Chronic pain syndrome      Updated Problems: Problem  Lumbar Facet Arthropathy    Plan of Care  Ms. LUKISHA PROCIDA has a current medication list which includes the following long-term medication(s): albuterol, desvenlafaxine, gabapentin, gabapentin, lantus solostar, omeprazole, pravastatin, spironolactone, and trazodone.  Patti follows up today for postprocedural evaluation.  She is status post 2 series of diagnostic lumbar facet medial branch nerve blocks bilaterally at L3, L4, L5 done on 01/04/2020, 03/09/2020 respectively.  Both series of diagnostic lumbar facet medial branch nerve blocks provided 65 to 80% pain relief for approximately 3 weeks with  gradual return of pain thereafter.  Patient was able to perform her household chores and ADLs and less pain and states that her functional status and quality of life are also better for that period of time.  We discussed a therapeutic lumbar radiofrequency ablation for the purpose of obtaining longer-term pain relief.  Risks and benefits of this procedure were discussed in great detail and patient would like to proceed with right lumbar radiofrequency ablation with sedation.  Orders:  Orders Placed This Encounter  Procedures  . Radiofrequency,Lumbar    Standing Status:   Future    Standing Expiration Date:   04/20/2021    Scheduling Instructions:     Side(s): RIGHT     Level: L3-4, L4-5,  Facets ( L3, L4, L5, Medial Branch Nerves)     Sedation: with     Scheduling Timeframe: As soon as pre-approved    Order Specific Question:   Where will this procedure be performed?    Answer:   [facility]   Follow-up plan:   Return in about 2 weeks (around 05/04/2020) for R L3,4,5 RFA with sedation.     Status post bilateral L3, L4, L5 facet medial branch nerve blocks 01/04/2020 (65% pain relief for 1 month), 03/09/2020 (80% pain relief for 3 weeks).    Recent Visits Date Type Provider Dept  03/09/20 Procedure visit [name], MD [facility]  02/24/20 Telemedicine [name], MD [facility]  02/03/20 Procedure visit [name], MD [facility]  Showing recent visits within past 90 days and meeting all other requirements Today's Visits Date Type Provider Dept  04/20/20 Office Visit [name], MD [facility]  Showing today's visits and meeting all  other requirements Future Appointments No visits were found meeting these conditions. Showing future appointments within next 90 days and meeting all other requirements  I discussed the assessment and treatment plan with the patient. The patient was provided an opportunity to ask questions and all  were answered. The patient agreed with the plan and demonstrated an understanding of the instructions.  Patient advised to call back or seek an in-person evaluation if the symptoms or condition worsens.  Duration of encounter: 30 minutes.  Note by: [name], MD Date: 04/20/2020; Time: 2:43 PM

## 2020-04-20 NOTE — Patient Instructions (Addendum)
Preparing for Procedure with Sedation Instructions: . Oral Intake: Do not eat or drink anything for at least 8 hours prior to your procedure. . Transportation: Public transportation is not allowed. Bring an adult driver. The driver must be physically present in our waiting room before any procedure can be started. Marland Kitchen Physical Assistance: Bring an adult capable of physically assisting you, in the event you need help. . Blood Pressure Medicine: Take your blood pressure medicine with a sip of water the morning of the procedure. . Insulin: Take only  of your normal insulin dose. . Preventing infections: Shower with an antibacterial soap the morning of your procedure. . Build-up your immune system: Take 1000 mg of Vitamin C with every meal (3 times a day) the day prior to your procedure. . Pregnancy: If you are pregnant, call and cancel the procedure. . Sickness: If you have a cold, fever, or any active infections, call and cancel the procedure. . Arrival: You must be in the facility at least 30 minutes prior to your scheduled procedure. . Children: Do not bring children with you. . Dress appropriately: Bring dark clothing that you would not mind if they get stained. . Valuables: Do not bring any jewelry or valuables. Procedure appointments are reserved for interventional treatments only. Marland Kitchen No Prescription Refills. . No medication changes will be discussed during procedure appointments. . No disability issues will be discussed.  Radiofrequency Lesioning Radiofrequency lesioning is a procedure that is performed to relieve pain. The procedure is often used for back, neck, or arm pain. Radiofrequency lesioning involves the use of a machine that creates radio waves to make heat. During the procedure, the heat is applied to the nerve that carries the pain signal. The heat damages the nerve and interferes with the pain signal. Pain relief usually starts about 2 weeks after the procedure and lasts for 6  months to 1 year. You will be awake during the procedure. You will need to be able to talk with the health care provider during the procedure. Tell a health care provider about: Any allergies you have. All medicines you are taking, including vitamins, herbs, eye drops, creams, and over-the-counter medicines. Any problems you or family members have had with anesthetic medicines. Any blood disorders you have. Any surgeries you have had. Any medical conditions you have or have had. Whether you are pregnant or may be pregnant. What are the risks? Generally, this is a safe procedure. However, problems may occur, including: Pain or soreness at the injection site. Allergic reaction to medicines given during the procedure. Bleeding. Infection at the injection site. Damage to nerves or blood vessels. What happens before the procedure? Staying hydrated Follow instructions from your health care provider about hydration, which may include: Up to 2 hours before the procedure - you may continue to drink clear liquids, such as water, clear fruit juice, black coffee, and plain tea. Eating and drinking Follow instructions from your health care provider about eating and drinking, which may include: 8 hours before the procedure - stop eating heavy meals or foods, such as meat, fried foods, or fatty foods. 6 hours before the procedure - stop eating light meals or foods, such as toast or cereal. 6 hours before the procedure - stop drinking milk or drinks that contain milk. 2 hours before the procedure - stop drinking clear liquids. Medicines Ask your health care provider about: Changing or stopping your regular medicines. This is especially important if you are taking diabetes medicines or blood thinners.  Taking medicines such as aspirin and ibuprofen. These medicines can thin your blood. Do not take these medicines unless your health care provider tells you to take them. Taking over-the-counter medicines,  vitamins, herbs, and supplements. General instructions Plan to have someone take you home from the hospital or clinic. If you will be going home right after the procedure, plan to have someone with you for 24 hours. Ask your health care provider what steps will be taken to help prevent infection. These may include: Removing hair at the procedure site. Washing skin with a germ-killing soap. Taking antibiotic medicine. What happens during the procedure? An IV will be inserted into one of your veins. You will be given one or more of the following: A medicine to help you relax (sedative). A medicine to numb the area (local anesthetic). Your health care provider will insert a radiofrequency needle into the area to be treated. This is done with the help of a type of X-ray (fluoroscopy). A wire that carries the radio waves (electrode) will be put through the radiofrequency needle. An electrical pulse will be sent through the electrode to verify the correct nerve that is causing your pain. You will feel a tingling sensation, and you may have muscle twitching. The tissue around the needle tip will be heated by an electric current that comes from the radiofrequency machine. This will numb the nerves. The needle will be removed. A bandage (dressing) will be put on the insertion area. The procedure may vary among health care providers and hospitals.   What happens after the procedure? Your blood pressure, heart rate, breathing rate, and blood oxygen level will be monitored until you leave the hospital or clinic. Return to your normal activities as told by your health care provider. Ask your health care provider what activities are safe for you. Do not drive for 24 hours if you were given a sedative during your procedure. Summary Radiofrequency lesioning is a procedure that is performed to relieve pain. The procedure is often used for back, neck, or arm pain. Radiofrequency lesioning involves the use of  a machine that creates radio waves to make heat. Plan to have someone take you home from the hospital or clinic. Do not drive for 24 hours if you were given a sedative during your procedure. Return to your normal activities as told by your health care provider. Ask your health care provider what activities are safe for you. This information is not intended to replace advice given to you by your health care provider. Make sure you discuss any questions you have with your health care provider. Document Revised: 10/17/2017 Document Reviewed: 10/17/2017 [employer]vier Patient Education  2021 [employer]. .

## 2020-04-20 NOTE — Telephone Encounter (Signed)
Patient was in office yesterday I emptied urine before Mackenzie Bonilla added lab. Patient came back today to give urine sample. She should not be charged for lab visit for today. Is there a way we can get that waved? She is aware she will be charged for the lab test.

## 2020-04-20 NOTE — Progress Notes (Signed)
Nursing Pain Medication Assessment:  Safety precautions to be maintained throughout the outpatient stay will include: orient to surroundings, keep bed in low position, maintain call bell within reach at all times, provide assistance with transfer out of bed and ambulation.  Medication Inspection Compliance: Mackenzie Bonilla did not comply with our request to bring her pills to be counted. She was reminded that bringing the medication bottles, even when empty, is a requirement.  Medication: None brought in. Pill/Patch Count: None available to be counted. Bottle Appearance: No container available. Did not bring bottle(s) to appointment. Filled Date: N/A Last Medication intake:  last week

## 2020-04-21 ENCOUNTER — Other Ambulatory Visit: Payer: Self-pay | Admitting: Primary Care

## 2020-04-21 DIAGNOSIS — N3001 Acute cystitis with hematuria: Secondary | ICD-10-CM

## 2020-04-21 LAB — URINE CULTURE
MICRO NUMBER:: 11621274
SPECIMEN QUALITY:: ADEQUATE

## 2020-04-21 LAB — URINALYSIS, MICROSCOPIC ONLY

## 2020-04-21 MED ORDER — SULFAMETHOXAZOLE-TRIMETHOPRIM 800-160 MG PO TABS
1.0000 | ORAL_TABLET | Freq: Two times a day (BID) | ORAL | 0 refills | Status: DC
Start: 2020-04-21 — End: 2020-06-15

## 2020-04-21 NOTE — Telephone Encounter (Signed)
There is no charge for a lab appt- but we do need to charge for handling the specimen

## 2020-04-26 ENCOUNTER — Telehealth: Payer: Self-pay | Admitting: Family Medicine

## 2020-04-26 DIAGNOSIS — H6123 Impacted cerumen, bilateral: Secondary | ICD-10-CM | POA: Diagnosis not present

## 2020-04-26 DIAGNOSIS — R1310 Dysphagia, unspecified: Secondary | ICD-10-CM | POA: Diagnosis not present

## 2020-04-26 DIAGNOSIS — H902 Conductive hearing loss, unspecified: Secondary | ICD-10-CM | POA: Diagnosis not present

## 2020-04-26 NOTE — Telephone Encounter (Signed)
Called patient reviewed all information and repeated back to me. Will call if any questions.  ? ?

## 2020-04-26 NOTE — Telephone Encounter (Signed)
Keep pushing the water and alert [place] if any pain to urinate or if odor or other uti symptoms return

## 2020-04-26 NOTE — Telephone Encounter (Signed)
PT CALLED IN AND STATED THAT SHE HAD AN APPOINTMENT WITH KATE, SHE STATED THAT SHE WAS BEING TREATED FOR AN UTI , SHE STATED THAT THE SMELL IS NO LONGER THERE BUT THE URINE IS STILL DARK, AND SHE IS DRINKING AS MUCH AS SHE CAN AND HAS FINISHED HER MEDICATION ON Monday

## 2020-05-09 ENCOUNTER — Ambulatory Visit (HOSPITAL_BASED_OUTPATIENT_CLINIC_OR_DEPARTMENT_OTHER): Payer: Medicare PPO | Admitting: Student in an Organized Health Care Education/Training Program

## 2020-05-09 ENCOUNTER — Encounter: Payer: Self-pay | Admitting: Student in an Organized Health Care Education/Training Program

## 2020-05-09 ENCOUNTER — Ambulatory Visit
Admission: RE | Admit: 2020-05-09 | Discharge: 2020-05-09 | Disposition: A | Discharge status: Home / Self Care | Payer: Medicare PPO | Source: Ambulatory Visit | Attending: Student in an Organized Health Care Education/Training Program | Admitting: Student in an Organized Health Care Education/Training Program

## 2020-05-09 ENCOUNTER — Other Ambulatory Visit: Payer: Self-pay

## 2020-05-09 DIAGNOSIS — G894 Chronic pain syndrome: Secondary | ICD-10-CM | POA: Diagnosis not present

## 2020-05-09 DIAGNOSIS — M47816 Spondylosis without myelopathy or radiculopathy, lumbar region: Secondary | ICD-10-CM

## 2020-05-09 MED ORDER — DEXAMETHASONE SODIUM PHOSPHATE 10 MG/ML IJ SOLN
10.0000 mg | Freq: Once | INTRAMUSCULAR | Status: AC
  Administered 2020-05-09: 10 mg
  Filled 2020-05-09: qty 1

## 2020-05-09 MED ORDER — FENTANYL CITRATE (PF) 100 MCG/2ML IJ SOLN
25.0000 ug | INTRAMUSCULAR | Status: DC | PRN
  Administered 2020-05-09: 75 ug via INTRAVENOUS
  Filled 2020-05-09: qty 2

## 2020-05-09 MED ORDER — ROPIVACAINE HCL 2 MG/ML IJ SOLN
9.0000 mL | Freq: Once | INTRAMUSCULAR | Status: AC
  Administered 2020-05-09: 10 mL via PERINEURAL
  Filled 2020-05-09: qty 10

## 2020-05-09 MED ORDER — LIDOCAINE HCL 2 % IJ SOLN
20.0000 mL | Freq: Once | INTRAMUSCULAR | Status: AC
  Administered 2020-05-09: 400 mg
  Filled 2020-05-09: qty 20

## 2020-05-09 NOTE — Progress Notes (Signed)
PROVIDER NOTE: Information contained herein reflects review and annotations entered in association with encounter. Interpretation of such information and data should be left to medically-trained personnel. Information provided to patient can be located elsewhere in the medical record under "Patient Instructions". Document created using STT-dictation technology, any transcriptional errors that may result from process are unintentional.    Patient: Mackenzie Bonilla  Service Category: Procedure  Provider: [name], MD  DOB: 1965/09/23  DOS: 05/09/2020  Location: [facility]  MRN: [id number]  Setting: Ambulatory - outpatient  Referring Provider: [name], MD  Type: Established Patient  Specialty: Interventional Pain Management  PCP: Abner Greenspan, MD   Primary Reason for Visit: Interventional Pain Management Treatment. CC: Back Pain (low)  Procedure:          Anesthesia, Analgesia, Anxiolysis:  Type: Thermal Lumbar Facet, Medial Branch Radiofrequency Ablation/Neurotomy  #1  Primary Purpose: Therapeutic Region: Posterolateral Lumbosacral Spine Level: L3, L4, L5, Medial Branch Level(s). These levels will denervate the L3-4, L4-5,lumbar facet joints. Laterality: Right  Type: Moderate (Conscious) Sedation combined with Local Anesthesia Indication(s): Analgesia and Anxiety Route: Intravenous (IV) IV Access: Secured Sedation: Meaningful verbal contact was maintained at all times during the procedure  Local Anesthetic: Lidocaine 1-2%  Position: Prone   Indications: 1. Lumbar facet arthropathy (severe right L4-L5)   2. Spondylosis without myelopathy or radiculopathy, lumbar region   3. Chronic pain syndrome    Ms. Guise has been dealing with the above chronic pain for longer than three months and has either failed to respond, was unable to tolerate, or simply did not get enough benefit from other more conservative therapies including, but not limited to: 1.  Over-the-counter medications 2. Anti-inflammatory medications 3. Muscle relaxants 4. Membrane stabilizers 5. Opioids 6. Physical therapy and/or chiropractic manipulation 7. Modalities (Heat, ice, etc.) 8. Invasive techniques such as nerve blocks. Ms. Brubacher has attained more than 50% relief of the pain from a series of diagnostic injections conducted in separate occasions.  Pain Score: Pre-procedure: 3 /10 Post-procedure: 1  (moving/standing)/10  Pre-op H&P Assessment:  Mackenzie Bonilla is a 55 y.o. (year old), female patient, seen today for interventional treatment. She  has a past surgical history that includes Tonsillectomy; Septoplasty; Foot surgery; uterine tumor (09/2001); Vagus nerve stimulator insertion; Endometrial biopsy (01/2004); Uterine fibroid surgery (06/2005); Colonoscopy (08/12/2012); Esophagogastroduodenoscopy; Breast lumpectomy with radioactive seed localization (Right, 08/02/2014); Breast biopsy (Right, 2016); and Esophagogastroduodenoscopy (egd) with propofol (N/A, 08/18/2019). [name] has a current medication list which includes the following prescription(s): accu-chek aviva plus, accu-chek softclix lancets, acetaminophen, albuterol, alprazolam, accu-chek aviva, fifty50 glucose meter 2.0, bupropion, cyclobenzaprine, desvenlafaxine, dimenhydrinate, fluticasone, gabapentin, gabapentin, glipizide, ibuprofen, insulin degludec, lantus solostar, losartan, magnesium, nystatin cream, omeprazole, oxybutynin, polyethyl glycol-propyl glycol, pravastatin, simethicone, spironolactone, tramadol, trazodone, trulicity, zinc oxide, and sulfamethoxazole-trimethoprim, and the following Facility-Administered Medications: fentanyl. Her primarily concern today is the Back Pain (low)  Initial Vital Signs:  Pulse/HCG Rate: 82ECG Heart Rate: 74 Temp: (!) 97.2 F (36.2 C) Resp: 18 BP: 106/69 SpO2: 95 %  BMI: Estimated body mass index is 56.47 kg/m as calculated from the following:   Height as  of this encounter: 5\' 4"  (1.626 m).   Weight as of this encounter: 329 lb (149.2 kg).  Risk Assessment: Allergies: Reviewed. She is allergic to cephalexin, codeine, duloxetine, levaquin [levofloxacin], lisinopril, metformin and related, quetiapine, sertraline hcl, triazolam, zaleplon, zocor [simvastatin - high dose], zolpidem tartrate, hydrocodone, and norelgestromin-eth estradiol.  Allergy Precautions: None required Coagulopathies: Reviewed. None identified.  Blood-thinner therapy: None at this time Active Infection(s): Reviewed. None identified. Mackenzie Bonilla is afebrile  Site Confirmation: Mackenzie Bonilla was asked to confirm the procedure and laterality before marking the site Procedure checklist: Completed Consent: Before the procedure and under the influence of no sedative(s), amnesic(s), or anxiolytics, the patient was informed of the treatment options, risks and possible complications. To fulfill our ethical and legal obligations, as recommended by the American Medical Association's Code of Ethics, I have informed the patient of my clinical impression; the nature and purpose of the treatment or procedure; the risks, benefits, and possible complications of the intervention; the alternatives, including doing nothing; the risk(s) and benefit(s) of the alternative treatment(s) or procedure(s); and the risk(s) and benefit(s) of doing nothing. The patient was provided information about the general risks and possible complications associated with the procedure. These may include, but are not limited to: failure to achieve desired goals, infection, bleeding, organ or nerve damage, allergic reactions, paralysis, and death. In addition, the patient was informed of those risks and complications associated to Spine-related procedures, such as failure to decrease pain; infection (i.e.: Meningitis, epidural or intraspinal abscess); bleeding (i.e.: epidural hematoma, subarachnoid hemorrhage, or any other type of  intraspinal or peri-dural bleeding); organ or nerve damage (i.e.: Any type of peripheral nerve, nerve root, or spinal cord injury) with subsequent damage to sensory, motor, and/or autonomic systems, resulting in permanent pain, numbness, and/or weakness of one or several areas of the body; allergic reactions; (i.e.: anaphylactic reaction); and/or death. Furthermore, the patient was informed of those risks and complications associated with the medications. These include, but are not limited to: allergic reactions (i.e.: anaphylactic or anaphylactoid reaction(s)); adrenal axis suppression; blood sugar elevation that in diabetics may result in ketoacidosis or comma; water retention that in patients with history of congestive heart failure may result in shortness of breath, pulmonary edema, and decompensation with resultant heart failure; weight gain; swelling or edema; medication-induced neural toxicity; particulate matter embolism and blood vessel occlusion with resultant organ, and/or nervous system infarction; and/or aseptic necrosis of one or more joints. Finally, the patient was informed that Medicine is not an exact science; therefore, there is also the possibility of unforeseen or unpredictable risks and/or possible complications that may result in a catastrophic outcome. The patient indicated having understood very clearly. We have given the patient no guarantees and we have made no promises. Enough time was given to the patient to ask questions, all of which were answered to the patient's satisfaction. Ms. Luz has indicated that she wanted to continue with the procedure. Attestation: I, the ordering provider, attest that I have discussed with the patient the benefits, risks, side-effects, alternatives, likelihood of achieving goals, and potential problems during recovery for the procedure that I have provided informed consent. Date  Time: 05/09/2020  7:52 AM  Pre-Procedure Preparation:  Monitoring:  As per clinic protocol. Respiration, ETCO2, SpO2, BP, heart rate and rhythm monitor placed and checked for adequate function Safety Precautions: Patient was assessed for positional comfort and pressure points before starting the procedure. Time-out: I initiated and conducted the "Time-out" before starting the procedure, as per protocol. The patient was asked to participate by confirming the accuracy of the "Time Out" information. Verification of the correct person, site, and procedure were performed and confirmed by me, the nursing staff, and the patient. "Time-out" conducted as per Joint Commission's Universal Protocol (UP.01.01.01). Time: 7371  Description of Procedure:          Laterality: Right Levels:  L3, L4, L5,Medial Branch Level(s), at the L3-4, L4-5, lumbar facet joints. Area Prepped: Lumbosacral DuraPrep (Iodine Povacrylex [0.7% available iodine] and Isopropyl Alcohol, 74% w/w) Safety Precautions: Aspiration looking for blood return was conducted prior to all injections. At no point did we inject any substances, as a needle was being advanced. Before injecting, the patient was told to immediately notify me if she was experiencing any new onset of "ringing in the ears, or metallic taste in the mouth". No attempts were made at seeking any paresthesias. Safe injection practices and needle disposal techniques used. Medications properly checked for expiration dates. SDV (single dose vial) medications used. After the completion of the procedure, all disposable equipment used was discarded in the proper designated medical waste containers. Local Anesthesia: Protocol guidelines were followed. The patient was positioned over the fluoroscopy table. The area was prepped in the usual manner. The time-out was completed. The target area was identified using fluoroscopy. A 12-in long, straight, sterile hemostat was used with fluoroscopic guidance to locate the targets for each level blocked. Once located, the  skin was marked with an approved surgical skin marker. Once all sites were marked, the skin (epidermis, dermis, and hypodermis), as well as deeper tissues (fat, connective tissue and muscle) were infiltrated with a small amount of a short-acting local anesthetic, loaded on a 10cc syringe with a 25G, 1.5-in  Needle. An appropriate amount of time was allowed for local anesthetics to take effect before proceeding to the next step. Local Anesthetic: Lidocaine 2.0% The unused portion of the local anesthetic was discarded in the proper designated containers. Technical explanation of process:  Radiofrequency Ablation (RFA) L3 Medial Branch Nerve RFA: The target area for the L3 medial branch is at the junction of the postero-lateral aspect of the superior articular process and the superior, posterior, and medial edge of the transverse process of L4. Under fluoroscopic guidance, a Radiofrequency needle was inserted until contact was made with os over the superior postero-lateral aspect of the pedicular shadow (target area). Sensory and motor testing was conducted to properly adjust the position of the needle. Once satisfactory placement of the needle was achieved, the numbing solution was slowly injected after negative aspiration for blood. 2.0 mL of the nerve block solution was injected without difficulty or complication. After waiting for at least 3 minutes, the ablation was performed. Once completed, the needle was removed intact. L4 Medial Branch Nerve RFA: The target area for the L4 medial branch is at the junction of the postero-lateral aspect of the superior articular process and the superior, posterior, and medial edge of the transverse process of L5. Under fluoroscopic guidance, a Radiofrequency needle was inserted until contact was made with os over the superior postero-lateral aspect of the pedicular shadow (target area). Sensory and motor testing was conducted to properly adjust the position of the needle.  Once satisfactory placement of the needle was achieved, the numbing solution was slowly injected after negative aspiration for blood. 2.0 mL of the nerve block solution was injected without difficulty or complication. After waiting for at least 3 minutes, the ablation was performed. Once completed, the needle was removed intact. L5 Medial Branch Nerve RFA: The target area for the L5 medial branch is at the junction of the postero-lateral aspect of the superior articular process of S1 and the superior, posterior, and medial edge of the sacral ala. Under fluoroscopic guidance, a Radiofrequency needle was inserted until contact was made with os over the superior postero-lateral aspect of the pedicular shadow (  target area). Sensory and motor testing was conducted to properly adjust the position of the needle. Once satisfactory placement of the needle was achieved, the numbing solution was slowly injected after negative aspiration for blood. 2.0 mL of the nerve block solution was injected without difficulty or complication. After waiting for at least 3 minutes, the ablation was performed. Once completed, the needle was removed intact. Radiofrequency lesioning (ablation):  Radiofrequency Generator: NeuroTherm [id number] Sensory Stimulation Parameters: 50 Hz was used to locate & identify the nerve, making sure that the needle was positioned such that there was no sensory stimulation below 0.3 V or above 0.7 V. Motor Stimulation Parameters: 2 Hz was used to evaluate the motor component. Care was taken not to lesion any nerves that demonstrated motor stimulation of the lower extremities at an output of less than 2.5 times that of the sensory threshold, or a maximum of 2.0 V. Lesioning Technique Parameters: Bonilla Radiofrequency settings. (Not bipolar or pulsed.) Temperature Settings: 80 degrees C Lesioning time: 60 seconds Intra-operative Compliance: Compliant Materials & Medications: Needle(s) (Electrode/Cannula)  Type: Teflon-coated, curved tip, Radiofrequency needle(s) Gauge: 22G Length: 10cm Numbing solution: 6cc solution made of 5 cc of 0.2% Ropivacaine, 1 cc of Decadron 10 mg/cc. 2 cc injected at each level above after sensorimotor testing prior to lesioning.  Once the entire procedure was completed, the treated area was cleaned, making sure to leave some of the prepping solution back to take advantage of its long term bactericidal properties.  Illustration of the posterior view of the lumbar spine and the posterior neural structures. Laminae of L2 through S1 are labeled. DPRL5, dorsal primary ramus of L5; DPRS1, dorsal primary ramus of S1; DPR3, dorsal primary ramus of L3; FJ, facet (zygapophyseal) joint L3-L4; I, inferior articular process of L4; LB1, lateral branch of dorsal primary ramus of L1; IAB, inferior articular branches from L3 medial branch (supplies L4-L5 facet joint); IBP, intermediate branch plexus; MB3, medial branch of dorsal primary ramus of L3; NR3, third lumbar nerve root; S, superior articular process of L5; SAB, superior articular branches from L4 (supplies L4-5 facet joint also); TP3, transverse process of L3.  Vitals:   05/09/20 0846 05/09/20 0854 05/09/20 0904 05/09/20 0914  BP: (!) 141/88 (!) 92/54 112/61 (!) 100/56  Pulse:  84 72 79  Resp: 13 18 16 16   Temp:    (!) 97.2 F (36.2 C)  TempSrc:    Axillary  SpO2: 95% 93% 94% 95%  Weight:      Height:       Start Time: 0832 hrs. End Time: 0846 hrs.  Imaging Guidance (Spinal):          Type of Imaging Technique: Fluoroscopy Guidance (Spinal) Indication(s): Assistance in needle guidance and placement for procedures requiring needle placement in or near specific anatomical locations not easily accessible without such assistance. Exposure Time: Please see nurses notes. Contrast: None used. Fluoroscopic Guidance: I was personally present during the use of fluoroscopy. "Tunnel Vision Technique" used to obtain the best possible  view of the target area. Parallax error corrected before commencing the procedure. "Direction-depth-direction" technique used to introduce the needle under continuous pulsed fluoroscopy. Once target was reached, antero-posterior, oblique, and lateral fluoroscopic projection used confirm needle placement in all planes. Images permanently stored in EMR. Interpretation: No contrast injected. I personally interpreted the imaging intraoperatively. Adequate needle placement confirmed in multiple planes. Permanent images saved into the patient's record.   Post-operative Assessment:  Post-procedure Vital Signs:  Pulse/HCG Rate: 79 (nsr)78 Temp: Marland Kitchen)  97.2 F (36.2 C) Resp: 16 BP: (!) 100/56 SpO2: 95 %  EBL: None  Complications: No immediate post-treatment complications observed by team, or reported by patient.  Note: The patient tolerated the entire procedure well. A repeat set of vitals were taken after the procedure and the patient was kept under observation following institutional policy, for this type of procedure. Post-procedural neurological assessment was performed, showing return to baseline, prior to discharge. The patient was provided with post-procedure discharge instructions, including a section on how to identify potential problems. Should any problems arise concerning this procedure, the patient was given instructions to immediately contact us, at any time, without hesitation. In any case, we plan to contact the patient by telephone for a follow-up status report regarding this interventional procedure.  Comments:  No additional relevant information.  Plan of Care  Orders:  Orders Placed This Encounter  Procedures  . Radiofrequency,Lumbar    Standing Status:   Future    Standing Expiration Date:   05/09/2021    Scheduling Instructions:     Side(s): Left-sided     Level: L3-4, L4-5, 1 Facets ( L3, L4, L5, Medial Branch Nerves)     Sedation: Patient's choice.     Scheduling Timeframe:  As soon as pre-approved    Order Specific Question:   Where will this procedure be performed?    Answer:   [facility]  . [facility] C-ARM 1-60 MIN NO REPORT    Intraoperative interpretation by procedural physician at [place].    Standing Status:   Standing    Number of Occurrences:   1    Order Specific Question:   Reason for exam:    Answer:   Assistance in needle guidance and placement for procedures requiring needle placement in or near specific anatomical locations not easily accessible without such assistance.   Medications ordered for procedure: Meds ordered this encounter  Medications  . lidocaine (XYLOCAINE) 2 % (with pres) injection 400 mg  . fentaNYL (SUBLIMAZE) injection 25-50 mcg    Make sure Narcan is available in the pyxis when using this medication. In the event of respiratory depression (RR< 8/min): Titrate NARCAN (naloxone) in increments of 0.1 to 0.2 mg IV at 2-3 minute intervals, until desired degree of reversal.  . dexamethasone (DECADRON) injection 10 mg  . ropivacaine (PF) 2 mg/mL (0.2%) (NAROPIN) injection 9 mL   Medications administered: We administered lidocaine, fentaNYL, dexamethasone, and ropivacaine (PF) 2 mg/mL (0.2%).  See the medical record for exact dosing, route, and time of administration.  Follow-up plan:   Return in about 2 weeks (around 05/23/2020) for Left L3, 4, 5 RFA , with sedation.      Status post bilateral L3, L4, L5 facet medial branch nerve blocks 01/04/2020 (65% pain relief for 1 month), 03/09/2020 (80% pain relief for 3 weeks).  Right L3, L4, L5 RFA 05/09/2020    Recent Visits Date Type Provider Dept  04/20/20 Office Visit [name], MD [facility]  03/09/20 Procedure visit [name], MD [facility]  02/24/20 Telemedicine [name], MD [facility]  Showing recent visits within past 90 days and meeting all other requirements Today's Visits Date Type Provider Dept   05/09/20 Procedure visit [name], MD [facility]  Showing today's visits and meeting all other requirements Future Appointments No visits were found meeting these conditions. Showing future appointments within next 90 days and meeting all other requirements  Disposition: Discharge home  Discharge (  Date  Time): 05/09/2020; 0919 hrs.   Primary Care Physician: Tower, Wynelle Fanny, MD Location: [facility] Note by: [name], MD Date: 05/09/2020; Time: 10:39 AM  Disclaimer:  Medicine is not an exact science. The only guarantee in medicine is that nothing is guaranteed. It is important to note that the decision to proceed with this intervention was based on the information collected from the patient. The Data and conclusions were drawn from the patient's questionnaire, the interview, and the physical examination. Because the information was provided in large part by the patient, it cannot be guaranteed that it has not been purposely or unconsciously manipulated. Every effort has been made to obtain as much relevant data as possible for this evaluation. It is important to note that the conclusions that lead to this procedure are derived in large part from the available data. Always take into account that the treatment will also be dependent on availability of resources and existing treatment guidelines, considered by other Pain Management Practitioners as being common knowledge and practice, at the time of the intervention. For Medico-Legal purposes, it is also important to point out that variation in procedural techniques and pharmacological choices are the acceptable norm. The indications, contraindications, technique, and results of the above procedure should only be interpreted and judged by a Board-Certified Interventional Pain Specialist with extensive familiarity and expertise in the same exact procedure and technique.

## 2020-05-09 NOTE — Patient Instructions (Signed)

## 2020-05-09 NOTE — Progress Notes (Signed)
Safety precautions to be maintained throughout the outpatient stay will include: orient to surroundings, keep bed in low position, maintain call bell within reach at all times, provide assistance with transfer out of bed and ambulation.  

## 2020-05-10 ENCOUNTER — Telehealth: Payer: Self-pay

## 2020-05-10 ENCOUNTER — Ambulatory Visit: Payer: Medicare PPO | Admitting: Student in an Organized Health Care Education/Training Program

## 2020-05-10 NOTE — Telephone Encounter (Signed)
Post procedure phone call. Patient states she is doing good.  

## 2020-05-20 ENCOUNTER — Other Ambulatory Visit: Payer: Self-pay | Admitting: Family Medicine

## 2020-05-24 ENCOUNTER — Ambulatory Visit: Payer: Medicare PPO | Admitting: Podiatry

## 2020-05-25 ENCOUNTER — Encounter: Payer: Self-pay | Admitting: Podiatry

## 2020-05-25 ENCOUNTER — Ambulatory Visit: Payer: Medicare PPO | Admitting: Podiatry

## 2020-05-25 ENCOUNTER — Other Ambulatory Visit: Payer: Self-pay

## 2020-05-25 DIAGNOSIS — M79675 Pain in left toe(s): Secondary | ICD-10-CM

## 2020-05-25 DIAGNOSIS — E0843 Diabetes mellitus due to underlying condition with diabetic autonomic (poly)neuropathy: Secondary | ICD-10-CM

## 2020-05-25 DIAGNOSIS — M79674 Pain in right toe(s): Secondary | ICD-10-CM | POA: Diagnosis not present

## 2020-05-25 DIAGNOSIS — B351 Tinea unguium: Secondary | ICD-10-CM | POA: Diagnosis not present

## 2020-05-25 DIAGNOSIS — E1142 Type 2 diabetes mellitus with diabetic polyneuropathy: Secondary | ICD-10-CM

## 2020-05-26 DIAGNOSIS — Z794 Long term (current) use of insulin: Secondary | ICD-10-CM | POA: Diagnosis not present

## 2020-05-26 DIAGNOSIS — E1129 Type 2 diabetes mellitus with other diabetic kidney complication: Secondary | ICD-10-CM | POA: Diagnosis not present

## 2020-05-26 DIAGNOSIS — R809 Proteinuria, unspecified: Secondary | ICD-10-CM | POA: Diagnosis not present

## 2020-05-31 NOTE — Progress Notes (Signed)
  Subjective:  Patient ID: Mackenzie Bonilla, female    DOB: April 02, 1965,  MRN: [id number]  Chief Complaint  Patient presents with  . Nail Problem  . Diabetes    Nail trim [facility]    55 y.o. female presents with the above complaint. History confirmed with patient.  Nails are thick and she has difficulty cutting them and she may have an ingrown on one side  Objective:  Physical Exam: warm, good capillary refill, no trophic changes or ulcerative lesions, normal DP and PT pulses and normal sensory exam.  Onychomycosis x10 with yellow thickened elongated nail plates with incurvated border on the right side Assessment:   1. Pain due to onychomycosis of toenails of both feet   2. Diabetes mellitus due to underlying condition with diabetic autonomic neuropathy, unspecified whether long term insulin use ([place])   3. Diabetic polyneuropathy associated with type 2 diabetes mellitus ([place])      Plan:  Patient was evaluated and treated and all questions answered.  Patient educated on diabetes. Discussed proper diabetic foot care and discussed risks and complications of disease. Educated patient in depth on reasons to return to the office immediately should he/she discover anything concerning or new on the feet. All questions answered. Discussed proper shoes as well.   Discussed the etiology and treatment options for the condition in detail with the patient. Educated patient on the topical and oral treatment options for mycotic nails. Recommended debridement of the nails today. Sharp and mechanical debridement performed of all painful and mycotic nails today. Nails debrided in length and thickness using a nail nipper to level of comfort. Discussed treatment options including appropriate shoe gear. Follow up as needed for painful nails.    Return in about 3 months (around 08/24/2020) for at risk diabetic foot care.

## 2020-06-01 ENCOUNTER — Other Ambulatory Visit: Payer: Self-pay

## 2020-06-01 ENCOUNTER — Other Ambulatory Visit: Payer: Self-pay | Admitting: Family Medicine

## 2020-06-01 ENCOUNTER — Encounter: Payer: Self-pay | Admitting: Student in an Organized Health Care Education/Training Program

## 2020-06-01 ENCOUNTER — Ambulatory Visit (HOSPITAL_BASED_OUTPATIENT_CLINIC_OR_DEPARTMENT_OTHER): Payer: Medicare PPO | Admitting: Student in an Organized Health Care Education/Training Program

## 2020-06-01 ENCOUNTER — Ambulatory Visit
Admission: RE | Admit: 2020-06-01 | Discharge: 2020-06-01 | Disposition: A | Discharge status: Home / Self Care | Payer: Medicare PPO | Source: Ambulatory Visit | Attending: Student in an Organized Health Care Education/Training Program | Admitting: Student in an Organized Health Care Education/Training Program

## 2020-06-01 DIAGNOSIS — G894 Chronic pain syndrome: Secondary | ICD-10-CM | POA: Diagnosis not present

## 2020-06-01 DIAGNOSIS — M47816 Spondylosis without myelopathy or radiculopathy, lumbar region: Secondary | ICD-10-CM | POA: Insufficient documentation

## 2020-06-01 MED ORDER — FENTANYL CITRATE (PF) 100 MCG/2ML IJ SOLN
25.0000 ug | INTRAMUSCULAR | Status: DC | PRN
  Administered 2020-06-01: 75 ug via INTRAVENOUS
  Filled 2020-06-01: qty 2

## 2020-06-01 MED ORDER — LIDOCAINE HCL 2 % IJ SOLN
20.0000 mL | Freq: Once | INTRAMUSCULAR | Status: AC
  Administered 2020-06-01: 400 mg
  Filled 2020-06-01: qty 40

## 2020-06-01 MED ORDER — LACTATED RINGERS IV SOLN
1000.0000 mL | Freq: Once | INTRAVENOUS | Status: AC
  Administered 2020-06-01: 1000 mL via INTRAVENOUS

## 2020-06-01 MED ORDER — ROPIVACAINE HCL 2 MG/ML IJ SOLN
9.0000 mL | Freq: Once | INTRAMUSCULAR | Status: AC
  Administered 2020-06-01: 9 mL via PERINEURAL
  Filled 2020-06-01: qty 10

## 2020-06-01 MED ORDER — DEXAMETHASONE SODIUM PHOSPHATE 10 MG/ML IJ SOLN
10.0000 mg | Freq: Once | INTRAMUSCULAR | Status: AC
  Administered 2020-06-01: 10 mg
  Filled 2020-06-01: qty 1

## 2020-06-01 NOTE — Progress Notes (Signed)
Safety precautions to be maintained throughout the outpatient stay will include: orient to surroundings, keep bed in low position, maintain call bell within reach at all times, provide assistance with transfer out of bed and ambulation.  

## 2020-06-01 NOTE — Telephone Encounter (Signed)
Pt had had a few acute appts but no recent f/u or CPE, please advise

## 2020-06-01 NOTE — Telephone Encounter (Signed)
Please schedule a f/u or PE (her pref) and refill until then

## 2020-06-01 NOTE — Progress Notes (Signed)
PROVIDER NOTE: Information contained herein reflects review and annotations entered in association with encounter. Interpretation of such information and data should be left to medically-trained personnel. Information provided to patient can be located elsewhere in the medical record under "Patient Instructions". Document created using STT-dictation technology, any transcriptional errors that may result from process are unintentional.    Patient: Mackenzie Bonilla  Service Category: Procedure  Provider: [name], MD  DOB: 06-28-1965  DOS: 06/01/2020  Location: [facility]  MRN: [id number]  Setting: Ambulatory - outpatient  Referring Provider: [name], MD  Type: Established Patient  Specialty: Interventional Pain Management  PCP: Abner Greenspan, MD   Primary Reason for Visit: Interventional Pain Management Treatment. CC: Back Pain  Procedure:          Anesthesia, Analgesia, Anxiolysis:  Type: Thermal Lumbar Facet, Medial Branch Radiofrequency Ablation/Neurotomy  #1  Primary Purpose: Therapeutic Region: Posterolateral Lumbosacral Spine Level: L3, L4, L5, Medial Branch Level(s). These levels will denervate the L3-4, L4-5,lumbar facet joints. Laterality: Left  Type: Moderate (Conscious) Sedation combined with Local Anesthesia Indication(s): Analgesia and Anxiety Route: Intravenous (IV) IV Access: Secured Sedation: Meaningful verbal contact was maintained at all times during the procedure  Local Anesthetic: Lidocaine 1-2%  Position: Prone   Indications: 1. Lumbar facet arthropathy (severe right L4-L5)   2. Spondylosis without myelopathy or radiculopathy, lumbar region   3. Chronic pain syndrome    Mackenzie Bonilla has been dealing with the above chronic pain for longer than three months and has either failed to respond, was unable to tolerate, or simply did not get enough benefit from other more conservative therapies including, but not limited to: 1. Over-the-counter  medications 2. Anti-inflammatory medications 3. Muscle relaxants 4. Membrane stabilizers 5. Opioids 6. Physical therapy and/or chiropractic manipulation 7. Modalities (Heat, ice, etc.) 8. Invasive techniques such as nerve blocks. Mackenzie Bonilla has attained more than 50% relief of the pain from a series of diagnostic injections conducted in separate occasions.  Pain Score: Pre-procedure: 2 /10 Post-procedure: 2 /10  Pre-op H&P Assessment:  Mackenzie Bonilla is a 55 y.o. (year old), female patient, seen today for interventional treatment. She  has a past surgical history that includes Tonsillectomy; Septoplasty; Foot surgery; uterine tumor (09/2001); Vagus nerve stimulator insertion; Endometrial biopsy (01/2004); Uterine fibroid surgery (06/2005); Colonoscopy (08/12/2012); Esophagogastroduodenoscopy; Breast lumpectomy with radioactive seed localization (Right, 08/02/2014); Breast biopsy (Right, 2016); and Esophagogastroduodenoscopy (egd) with propofol (N/A, 08/18/2019). Mackenzie Bonilla has a current medication list which includes the following prescription(s): accu-chek aviva plus, accu-chek softclix lancets, acetaminophen, albuterol, alprazolam, accu-chek aviva, fifty50 glucose meter 2.0, bupropion, covid-19 mrna vaccine (pfizer), cyclobenzaprine, desvenlafaxine, dimenhydrinate, fluticasone, gabapentin, gabapentin, glipizide, ibuprofen, insulin degludec, lantus solostar, losartan, magnesium, nystatin cream, omeprazole, oxybutynin, polyethyl glycol-propyl glycol, pravastatin, simethicone, spironolactone, sulfamethoxazole-trimethoprim, tramadol, trazodone, trulicity, and zinc oxide, and the following Facility-Administered Medications: fentanyl. Her primarily concern today is the Back Pain  Initial Vital Signs:  Pulse/HCG Rate: 84ECG Heart Rate: 73 Temp: 97.8 F (36.6 C) Resp: 16 BP: 126/68 SpO2: 94 %  BMI: Estimated body mass index is 56.47 kg/m as calculated from the following:   Height as of this  encounter: 5\' 4"  (1.626 m).   Weight as of this encounter: 329 lb (149.2 kg).  Risk Assessment: Allergies: Reviewed. She is allergic to cephalexin, codeine, duloxetine, levaquin [levofloxacin], lisinopril, metformin and related, quetiapine, sertraline hcl, triazolam, zaleplon, zocor [simvastatin - high dose], zolpidem tartrate, hydrocodone, and norelgestromin-eth estradiol.  Allergy Precautions: None required Coagulopathies: Reviewed. None identified.  Blood-thinner therapy: None at this time Active Infection(s): Reviewed. None identified. Mackenzie Bonilla is afebrile  Site Confirmation: Mackenzie Bonilla was asked to confirm the procedure and laterality before marking the site Procedure checklist: Completed Consent: Before the procedure and under the influence of no sedative(s), amnesic(s), or anxiolytics, the patient was informed of the treatment options, risks and possible complications. To fulfill our ethical and legal obligations, as recommended by the American Medical Association's Code of Ethics, I have informed the patient of my clinical impression; the nature and purpose of the treatment or procedure; the risks, benefits, and possible complications of the intervention; the alternatives, including doing nothing; the risk(s) and benefit(s) of the alternative treatment(s) or procedure(s); and the risk(s) and benefit(s) of doing nothing. The patient was provided information about the general risks and possible complications associated with the procedure. These may include, but are not limited to: failure to achieve desired goals, infection, bleeding, organ or nerve damage, allergic reactions, paralysis, and death. In addition, the patient was informed of those risks and complications associated to Spine-related procedures, such as failure to decrease pain; infection (i.e.: Meningitis, epidural or intraspinal abscess); bleeding (i.e.: epidural hematoma, subarachnoid hemorrhage, or any other type of  intraspinal or peri-dural bleeding); organ or nerve damage (i.e.: Any type of peripheral nerve, nerve root, or spinal cord injury) with subsequent damage to sensory, motor, and/or autonomic systems, resulting in permanent pain, numbness, and/or weakness of one or several areas of the body; allergic reactions; (i.e.: anaphylactic reaction); and/or death. Furthermore, the patient was informed of those risks and complications associated with the medications. These include, but are not limited to: allergic reactions (i.e.: anaphylactic or anaphylactoid reaction(s)); adrenal axis suppression; blood sugar elevation that in diabetics may result in ketoacidosis or comma; water retention that in patients with history of congestive heart failure may result in shortness of breath, pulmonary edema, and decompensation with resultant heart failure; weight gain; swelling or edema; medication-induced neural toxicity; particulate matter embolism and blood vessel occlusion with resultant organ, and/or nervous system infarction; and/or aseptic necrosis of one or more joints. Finally, the patient was informed that Medicine is not an exact science; therefore, there is also the possibility of unforeseen or unpredictable risks and/or possible complications that may result in a catastrophic outcome. The patient indicated having understood very clearly. We have given the patient no guarantees and we have made no promises. Enough time was given to the patient to ask questions, all of which were answered to the patient's satisfaction. Mackenzie Bonilla has indicated that she wanted to continue with the procedure. Attestation: I, the ordering provider, attest that I have discussed with the patient the benefits, risks, side-effects, alternatives, likelihood of achieving goals, and potential problems during recovery for the procedure that I have provided informed consent. Date  Time: 06/01/2020 10:41 AM  Pre-Procedure Preparation:  Monitoring:  As per clinic protocol. Respiration, ETCO2, SpO2, BP, heart rate and rhythm monitor placed and checked for adequate function Safety Precautions: Patient was assessed for positional comfort and pressure points before starting the procedure. Time-out: I initiated and conducted the "Time-out" before starting the procedure, as per protocol. The patient was asked to participate by confirming the accuracy of the "Time Out" information. Verification of the correct person, site, and procedure were performed and confirmed by me, the nursing staff, and the patient. "Time-out" conducted as per Joint Commission's Universal Protocol (UP.01.01.01). Time: 1120  Description of Procedure:          Laterality: Left Levels:  L3, L4, L5,Medial Branch Level(s), at the L3-4, L4-5, lumbar facet joints. Area Prepped: Lumbosacral DuraPrep (Iodine Povacrylex [0.7% available iodine] and Isopropyl Alcohol, 74% w/w) Safety Precautions: Aspiration looking for blood return was conducted prior to all injections. At no point did we inject any substances, as a needle was being advanced. Before injecting, the patient was told to immediately notify me if she was experiencing any new onset of "ringing in the ears, or metallic taste in the mouth". No attempts were made at seeking any paresthesias. Safe injection practices and needle disposal techniques used. Medications properly checked for expiration dates. SDV (single dose vial) medications used. After the completion of the procedure, all disposable equipment used was discarded in the proper designated medical waste containers. Local Anesthesia: Protocol guidelines were followed. The patient was positioned over the fluoroscopy table. The area was prepped in the usual manner. The time-out was completed. The target area was identified using fluoroscopy. A 12-in long, straight, sterile hemostat was used with fluoroscopic guidance to locate the targets for each level blocked. Once located, the  skin was marked with an approved surgical skin marker. Once all sites were marked, the skin (epidermis, dermis, and hypodermis), as well as deeper tissues (fat, connective tissue and muscle) were infiltrated with a small amount of a short-acting local anesthetic, loaded on a 10cc syringe with a 25G, 1.5-in  Needle. An appropriate amount of time was allowed for local anesthetics to take effect before proceeding to the next step. Local Anesthetic: Lidocaine 2.0% The unused portion of the local anesthetic was discarded in the proper designated containers. Technical explanation of process:  Radiofrequency Ablation (RFA) L3 Medial Branch Nerve RFA: The target area for the L3 medial branch is at the junction of the postero-lateral aspect of the superior articular process and the superior, posterior, and medial edge of the transverse process of L4. Under fluoroscopic guidance, a Radiofrequency needle was inserted until contact was made with os over the superior postero-lateral aspect of the pedicular shadow (target area). Sensory and motor testing was conducted to properly adjust the position of the needle. Once satisfactory placement of the needle was achieved, the numbing solution was slowly injected after negative aspiration for blood. 2.0 mL of the nerve block solution was injected without difficulty or complication. After waiting for at least 3 minutes, the ablation was performed. Once completed, the needle was removed intact. L4 Medial Branch Nerve RFA: The target area for the L4 medial branch is at the junction of the postero-lateral aspect of the superior articular process and the superior, posterior, and medial edge of the transverse process of L5. Under fluoroscopic guidance, a Radiofrequency needle was inserted until contact was made with os over the superior postero-lateral aspect of the pedicular shadow (target area). Sensory and motor testing was conducted to properly adjust the position of the needle.  Once satisfactory placement of the needle was achieved, the numbing solution was slowly injected after negative aspiration for blood. 2.0 mL of the nerve block solution was injected without difficulty or complication. After waiting for at least 3 minutes, the ablation was performed. Once completed, the needle was removed intact. L5 Medial Branch Nerve RFA: The target area for the L5 medial branch is at the junction of the postero-lateral aspect of the superior articular process of S1 and the superior, posterior, and medial edge of the sacral ala. Under fluoroscopic guidance, a Radiofrequency needle was inserted until contact was made with os over the superior postero-lateral aspect of the pedicular shadow (  target area). Sensory and motor testing was conducted to properly adjust the position of the needle. Once satisfactory placement of the needle was achieved, the numbing solution was slowly injected after negative aspiration for blood. 2.0 mL of the nerve block solution was injected without difficulty or complication. After waiting for at least 3 minutes, the ablation was performed. Once completed, the needle was removed intact. Radiofrequency lesioning (ablation):  Radiofrequency Generator: NeuroTherm [id number] Sensory Stimulation Parameters: 50 Hz was used to locate & identify the nerve, making sure that the needle was positioned such that there was no sensory stimulation below 0.3 V or above 0.7 V. Motor Stimulation Parameters: 2 Hz was used to evaluate the motor component. Care was taken not to lesion any nerves that demonstrated motor stimulation of the lower extremities at an output of less than 2.5 times that of the sensory threshold, or a maximum of 2.0 V. Lesioning Technique Parameters: Bonilla Radiofrequency settings. (Not bipolar or pulsed.) Temperature Settings: 80 degrees C Lesioning time: 60 seconds Intra-operative Compliance: Compliant Materials & Medications: Needle(s) (Electrode/Cannula)  Type: Teflon-coated, curved tip, Radiofrequency needle(s) Gauge: 22G Length: 10cm Numbing solution: 6cc solution made of 5 cc of 0.2% Ropivacaine, 1 cc of Decadron 10 mg/cc. 2 cc injected at each level above after sensorimotor testing prior to lesioning.  Once the entire procedure was completed, the treated area was cleaned, making sure to leave some of the prepping solution back to take advantage of its long term bactericidal properties.  Illustration of the posterior view of the lumbar spine and the posterior neural structures. Laminae of L2 through S1 are labeled. DPRL5, dorsal primary ramus of L5; DPRS1, dorsal primary ramus of S1; DPR3, dorsal primary ramus of L3; FJ, facet (zygapophyseal) joint L3-L4; I, inferior articular process of L4; LB1, lateral branch of dorsal primary ramus of L1; IAB, inferior articular branches from L3 medial branch (supplies L4-L5 facet joint); IBP, intermediate branch plexus; MB3, medial branch of dorsal primary ramus of L3; NR3, third lumbar nerve root; S, superior articular process of L5; SAB, superior articular branches from L4 (supplies L4-5 facet joint also); TP3, transverse process of L3.  Vitals:   06/01/20 1140 06/01/20 1149 06/01/20 1159 06/01/20 1211  BP: (!) 152/132 (!) 100/43 (!) 88/60 (!) 85/55  Pulse:      Resp: 18 12 16 16   Temp:  98 F (36.7 C)  97.8 F (36.6 C)  TempSrc:  Temporal  Temporal  SpO2: 95% 91% 93% 95%  Weight:      Height:       Start Time: 1120 hrs. End Time: 1138 hrs.  Imaging Guidance (Spinal):          Type of Imaging Technique: Fluoroscopy Guidance (Spinal) Indication(s): Assistance in needle guidance and placement for procedures requiring needle placement in or near specific anatomical locations not easily accessible without such assistance. Exposure Time: Please see nurses notes. Contrast: None used. Fluoroscopic Guidance: I was personally present during the use of fluoroscopy. "Tunnel Vision Technique" used to obtain  the best possible view of the target area. Parallax error corrected before commencing the procedure. "Direction-depth-direction" technique used to introduce the needle under continuous pulsed fluoroscopy. Once target was reached, antero-posterior, oblique, and lateral fluoroscopic projection used confirm needle placement in all planes. Images permanently stored in EMR. Interpretation: No contrast injected. I personally interpreted the imaging intraoperatively. Adequate needle placement confirmed in multiple planes. Permanent images saved into the patient's record.   Post-operative Assessment:  Post-procedure Vital Signs:  Pulse/HCG Rate:  8472 Temp: 97.8 F (36.6 C) Resp: 16 BP: (!) 85/55 SpO2: 95 %  EBL: None  Complications: No immediate post-treatment complications observed by team, or reported by patient.  Note: The patient tolerated the entire procedure well. A repeat set of vitals were taken after the procedure and the patient was kept under observation following institutional policy, for this type of procedure. Post-procedural neurological assessment was performed, showing return to baseline, prior to discharge. The patient was provided with post-procedure discharge instructions, including a section on how to identify potential problems. Should any problems arise concerning this procedure, the patient was given instructions to immediately contact us, at any time, without hesitation. In any case, we plan to contact the patient by telephone for a follow-up status report regarding this interventional procedure.  Comments:  No additional relevant information.  Plan of Care  Orders:  Orders Placed This Encounter  Procedures  . [facility] C-ARM 1-60 MIN NO REPORT    Intraoperative interpretation by procedural physician at [place].    Standing Status:   Standing    Number of Occurrences:   1    Order Specific Question:   Reason for exam:    Answer:   Assistance in  needle guidance and placement for procedures requiring needle placement in or near specific anatomical locations not easily accessible without such assistance.   Medications ordered for procedure: Meds ordered this encounter  Medications  . lidocaine (XYLOCAINE) 2 % (with pres) injection 400 mg  . lactated ringers infusion 1,000 mL  . fentaNYL (SUBLIMAZE) injection 25-50 mcg    Make sure Narcan is available in the pyxis when using this medication. In the event of respiratory depression (RR< 8/min): Titrate NARCAN (naloxone) in increments of 0.1 to 0.2 mg IV at 2-3 minute intervals, until desired degree of reversal.  . ropivacaine (PF) 2 mg/mL (0.2%) (NAROPIN) injection 9 mL  . dexamethasone (DECADRON) injection 10 mg   Medications administered: We administered lidocaine, lactated ringers, fentaNYL, ropivacaine (PF) 2 mg/mL (0.2%), and dexamethasone.  See the medical record for exact dosing, route, and time of administration.  Follow-up plan:   Return in about 8 weeks (around 07/27/2020) for Post Procedure Evaluation, in person.      Status post bilateral L3, L4, L5 facet medial branch nerve blocks 01/04/2020 (65% pain relief for 1 month), 03/09/2020 (80% pain relief for 3 weeks).  Right L3, L4, L5 RFA 05/09/2020, Left L3,4,5 RFA 06/01/20    Recent Visits Date Type Provider Dept  05/09/20 Procedure visit [name], MD [facility]  04/20/20 Office Visit [name], MD [facility]  03/09/20 Procedure visit [name], MD [facility]  Showing recent visits within past 90 days and meeting all other requirements Today's Visits Date Type Provider Dept  06/01/20 Procedure visit [name], MD [facility]  Showing today's visits and meeting all other requirements Future Appointments Date Type Provider Dept  08/04/20 Appointment [name], MD [facility]  Showing future appointments within next 90 days and meeting all other  requirements  Disposition: Discharge home  Discharge (Date  Time): 06/01/2020; 1212 hrs.   Primary Care Physician: Tower, Wynelle Fanny, MD Location: [facility] Note by: [name], MD Date: 06/01/2020; Time: 12:50 PM  Disclaimer:  Medicine is not an exact science. The only guarantee in medicine is that nothing is guaranteed. It is important to note that the decision to proceed with this intervention was based on the  information collected from the patient. The Data and conclusions were drawn from the patient's questionnaire, the interview, and the physical examination. Because the information was provided in large part by the patient, it cannot be guaranteed that it has not been purposely or unconsciously manipulated. Every effort has been made to obtain as much relevant data as possible for this evaluation. It is important to note that the conclusions that lead to this procedure are derived in large part from the available data. Always take into account that the treatment will also be dependent on availability of resources and existing treatment guidelines, considered by other Pain Management Practitioners as being common knowledge and practice, at the time of the intervention. For Medico-Legal purposes, it is also important to point out that variation in procedural techniques and pharmacological choices are the acceptable norm. The indications, contraindications, technique, and results of the above procedure should only be interpreted and judged by a Board-Certified Interventional Pain Specialist with extensive familiarity and expertise in the same exact procedure and technique.

## 2020-06-01 NOTE — Patient Instructions (Signed)

## 2020-06-02 ENCOUNTER — Telehealth: Payer: Self-pay

## 2020-06-02 NOTE — Telephone Encounter (Signed)
Post procedure phone call.  Patient states she had a rough day yesterday, but will see what today holds.  Informed her to call us for any questions or concerns.

## 2020-06-03 DIAGNOSIS — E1142 Type 2 diabetes mellitus with diabetic polyneuropathy: Secondary | ICD-10-CM | POA: Diagnosis not present

## 2020-06-03 NOTE — Telephone Encounter (Signed)
I spoke to patient.  She was waiting for her neurologist to call for a virtual visit.  Patient said she'll call back to schedule a follow up appointment with Dr.Tower to discuss if she needs to continue this medication.

## 2020-06-03 NOTE — Telephone Encounter (Signed)
Please schedule CPE with fasting labs or follow up with Dr. Glori Bickers.  Once scheduled, please send refill back to [facility].

## 2020-06-15 ENCOUNTER — Encounter: Payer: Self-pay | Admitting: Family Medicine

## 2020-06-15 ENCOUNTER — Other Ambulatory Visit: Payer: Self-pay

## 2020-06-15 ENCOUNTER — Ambulatory Visit: Payer: Medicare PPO | Admitting: Family Medicine

## 2020-06-15 VITALS — BP 126/72 | HR 95 | Temp 96.9°F | Ht 64.0 in | Wt 328.2 lb

## 2020-06-15 DIAGNOSIS — E1169 Type 2 diabetes mellitus with other specified complication: Secondary | ICD-10-CM

## 2020-06-15 DIAGNOSIS — E1151 Type 2 diabetes mellitus with diabetic peripheral angiopathy without gangrene: Secondary | ICD-10-CM

## 2020-06-15 DIAGNOSIS — J4520 Mild intermittent asthma, uncomplicated: Secondary | ICD-10-CM | POA: Diagnosis not present

## 2020-06-15 DIAGNOSIS — K219 Gastro-esophageal reflux disease without esophagitis: Secondary | ICD-10-CM

## 2020-06-15 DIAGNOSIS — E781 Pure hyperglyceridemia: Secondary | ICD-10-CM

## 2020-06-15 DIAGNOSIS — E785 Hyperlipidemia, unspecified: Secondary | ICD-10-CM

## 2020-06-15 DIAGNOSIS — E538 Deficiency of other specified B group vitamins: Secondary | ICD-10-CM | POA: Diagnosis not present

## 2020-06-15 DIAGNOSIS — E119 Type 2 diabetes mellitus without complications: Secondary | ICD-10-CM

## 2020-06-15 DIAGNOSIS — E282 Polycystic ovarian syndrome: Secondary | ICD-10-CM

## 2020-06-15 DIAGNOSIS — E559 Vitamin D deficiency, unspecified: Secondary | ICD-10-CM

## 2020-06-15 DIAGNOSIS — R5382 Chronic fatigue, unspecified: Secondary | ICD-10-CM | POA: Diagnosis not present

## 2020-06-15 DIAGNOSIS — R5383 Other fatigue: Secondary | ICD-10-CM | POA: Insufficient documentation

## 2020-06-15 LAB — CBC WITH DIFFERENTIAL/PLATELET
Basophils Absolute: 0 10*3/uL (ref 0.0–0.1)
Basophils Relative: 0.5 % (ref 0.0–3.0)
Eosinophils Absolute: 0.1 10*3/uL (ref 0.0–0.7)
Eosinophils Relative: 1.5 % (ref 0.0–5.0)
HCT: 43.9 % (ref 36.0–46.0)
Hemoglobin: 14.4 g/dL (ref 12.0–15.0)
Lymphocytes Relative: 14.4 % (ref 12.0–46.0)
Lymphs Abs: 1.2 10*3/uL (ref 0.7–4.0)
MCHC: 32.8 g/dL (ref 30.0–36.0)
MCV: 96.7 fl (ref 78.0–100.0)
Monocytes Absolute: 0.5 10*3/uL (ref 0.1–1.0)
Monocytes Relative: 5.7 % (ref 3.0–12.0)
Neutro Abs: 6.5 10*3/uL (ref 1.4–7.7)
Neutrophils Relative %: 77.9 % — ABNORMAL HIGH (ref 43.0–77.0)
Platelets: 223 10*3/uL (ref 150.0–400.0)
RBC: 4.54 Mil/uL (ref 3.87–5.11)
RDW: 14.5 % (ref 11.5–15.5)
WBC: 8.3 10*3/uL (ref 4.0–10.5)

## 2020-06-15 LAB — COMPREHENSIVE METABOLIC PANEL
ALT: 16 U/L (ref 0–35)
AST: 14 U/L (ref 0–37)
Albumin: 4.1 g/dL (ref 3.5–5.2)
Alkaline Phosphatase: 67 U/L (ref 39–117)
BUN: 14 mg/dL (ref 6–23)
CO2: 34 mEq/L — ABNORMAL HIGH (ref 19–32)
Calcium: 9.7 mg/dL (ref 8.4–10.5)
Chloride: 100 mEq/L (ref 96–112)
Creatinine, Ser: 1.14 mg/dL (ref 0.40–1.20)
GFR: 54.42 mL/min — ABNORMAL LOW (ref >60.00)
Glucose, Bld: 207 mg/dL — ABNORMAL HIGH (ref 70–99)
Potassium: 4.8 mEq/L (ref 3.5–5.1)
Sodium: 141 mEq/L (ref 135–145)
Total Bilirubin: 0.3 mg/dL (ref 0.2–1.2)
Total Protein: 6.9 g/dL (ref 6.0–8.3)

## 2020-06-15 LAB — LIPID PANEL
Cholesterol: 192 mg/dL (ref 0–200)
HDL: 34.2 mg/dL — ABNORMAL LOW
NonHDL: 158.03
Total CHOL/HDL Ratio: 6
Triglycerides: 211 mg/dL — ABNORMAL HIGH (ref 0.0–149.0)
VLDL: 42.2 mg/dL — ABNORMAL HIGH (ref 0.0–40.0)

## 2020-06-15 LAB — VITAMIN D 25 HYDROXY (VIT D DEFICIENCY, FRACTURES): VITD: 33.25 ng/mL (ref 30.00–100.00)

## 2020-06-15 LAB — VITAMIN B12: Vitamin B-12: 256 pg/mL (ref 211–911)

## 2020-06-15 LAB — LDL CHOLESTEROL, DIRECT: Direct LDL: 130 mg/dL

## 2020-06-15 MED ORDER — ALBUTEROL SULFATE HFA 108 (90 BASE) MCG/ACT IN AERS: 2.0000 | INHALATION_SPRAY | Freq: Four times a day (QID) | RESPIRATORY_TRACT | 3 refills | Status: DC | PRN

## 2020-06-15 MED ORDER — SPIRONOLACTONE 100 MG PO TABS: 100.0000 mg | ORAL_TABLET | Freq: Every day | ORAL | 3 refills | Status: DC

## 2020-06-15 MED ORDER — OMEPRAZOLE 20 MG PO CPDR: 1.0000 | DELAYED_RELEASE_CAPSULE | Freq: Two times a day (BID) | ORAL | 3 refills | Status: DC

## 2020-06-15 MED ORDER — PRAVASTATIN SODIUM 20 MG PO TABS: 20.0000 mg | ORAL_TABLET | Freq: Every day | ORAL | 3 refills | Status: DC

## 2020-06-15 NOTE — Assessment & Plan Note (Signed)
Discussed how this problem influences overall health and the risks it imposes  Reviewed plan for weight loss with lower calorie diet (via better food choices and also portion control or program like weight watchers) and exercise building up to or more than 30 minutes 5 days per week including some aerobic activity   Motivation for change is limited by chronic depression

## 2020-06-15 NOTE — Assessment & Plan Note (Signed)
Continues omeprazole 40 mg bid  Fair control currently if she watches diet  B12 and D levels today

## 2020-06-15 NOTE — Assessment & Plan Note (Signed)
Refilled albuterol Fair control  Pollen is a trigger

## 2020-06-15 NOTE — Progress Notes (Signed)
Subjective:    Patient ID: Mackenzie Bonilla, female    DOB: [birth year], 55 y.o.   MRN: [id number]  This visit occurred during the SARS-CoV-2 public health emergency.  Safety protocols were in place, including screening questions prior to the visit, additional usage of staff PPE, and extensive cleaning of exam room while observing appropriate contact time as indicated for disinfecting solutions.    HPI Pt presents for f/u of chronic health problems and med refills incl diabetes and GERD and hyperlipidemia and asthma   Wt Readings from Last 3 Encounters:  06/15/20 (!) 328 lb 3 oz (148.9 kg)  06/01/20 (!) 329 lb (149.2 kg)  05/09/20 (!) 329 lb (149.2 kg)   56.33 kg/m  Not doing a lot  Depression is still bad and now well controlled   Mild intermittent asthma  Uses albuterol mdi prn Needs refill  More during summer  Nasal allergies aso   Intertrigo  Used zinc oxide and mycostatin    GERD with complicated GI history  Takes omeprazole 20 mg bid   Hyperlipidemia Lab Results  Component Value Date   CHOL 193 02/11/2019   HDL 45.60 02/11/2019   LDLCALC 121 (H) 02/11/2019   LDLDIRECT 112.0 11/05/2014   TRIG 135.0 02/11/2019   CHOLHDL 4 02/11/2019   Takes pravastatin 20 mg daily  pcos Takes spironolactone 100 mg daily    BP Readings from Last 3 Encounters:  06/15/20 126/72  06/01/20 (!) 85/55  05/09/20 (!) 100/56   Pulse Readings from Last 3 Encounters:  06/15/20 95  06/01/20 84  05/09/20 79   Sees endocrinology for dm2 Last a1c 7.6 at Dr Joycie Peek office  tresiba and glipizide and trulicity (could not afofrd farxiga) Takes losartan for microalbuminemia and gabapentin for neuropathy  Per pt has CKD and did not know what it means   H/o B12 and D def Lab Results  Component Value Date   VITAMINB12 182 (L) 11/12/2018   Does not take oral B12  No shots (since pandemic)   Takes vit D 1000 iu daily    BP Readings from Last 3 Encounters:  06/15/20 126/72   06/01/20 (!) 85/55  05/09/20 (!) 100/56   Pulse Readings from Last 3 Encounters:  06/15/20 95  06/01/20 84  05/09/20 79     Review of Systems  Constitutional: Positive for fatigue. Negative for activity change, appetite change, fever and unexpected weight change.  HENT: Negative for congestion, ear pain, rhinorrhea, sinus pressure and sore throat.   Eyes: Negative for pain, redness and visual disturbance.  Respiratory: Negative for cough, shortness of breath and wheezing.   Cardiovascular: Negative for chest pain and palpitations.  Gastrointestinal: Negative for abdominal pain, blood in stool, constipation and diarrhea.  Endocrine: Negative for polydipsia and polyuria.  Genitourinary: Negative for dysuria, frequency and urgency.  Musculoskeletal: Positive for arthralgias, back pain and gait problem. Negative for myalgias.  Skin: Negative for pallor and rash.  Allergic/Immunologic: Negative for environmental allergies.  Neurological: Negative for dizziness, syncope and headaches.  Hematological: Negative for adenopathy. Does not bruise/bleed easily.  Psychiatric/Behavioral: Positive for dysphoric mood and sleep disturbance. Negative for decreased concentration. The patient is nervous/anxious.        Objective:   Physical Exam Constitutional:      General: She is not in acute distress.    Appearance: Normal appearance. She is well-developed. She is obese. She is not ill-appearing or diaphoretic.  HENT:     Head: Normocephalic and atraumatic.  Eyes:  Conjunctiva/sclera: Conjunctivae normal.     Pupils: Pupils are equal, round, and reactive to light.  Neck:     Thyroid: No thyromegaly.     Vascular: No carotid bruit or JVD.  Cardiovascular:     Rate and Rhythm: Regular rhythm. Tachycardia present.     Pulses: Normal pulses.     Heart sounds: Normal heart sounds. No gallop.   Pulmonary:     Effort: Pulmonary effort is normal. No respiratory distress.     Breath sounds:  Normal breath sounds. No wheezing or rales.  Abdominal:     General: Bowel sounds are normal. There is no distension or abdominal bruit.     Palpations: Abdomen is soft. There is no mass.     Tenderness: There is no abdominal tenderness.  Musculoskeletal:     Cervical back: Normal range of motion and neck supple. No tenderness.     Right lower leg: No edema.     Left lower leg: No edema.     Comments: Lymphedema noted (not pitting)  Lymphadenopathy:     Cervical: No cervical adenopathy.  Skin:    General: Skin is warm and dry.     Findings: No rash.  Neurological:     Mental Status: She is alert.     Sensory: No sensory deficit.     Coordination: Coordination normal.     Deep Tendon Reflexes: Reflexes are normal and symmetric.  Psychiatric:        Mood and Affect: Mood is depressed.        Cognition and Memory: Cognition normal.     Comments: Baseline depressed mood  Not tearful today           Assessment & Plan:   Problem List Items Addressed This Visit      Cardiovascular and Mediastinum   Diabetic angiopathy ([place])    Continues under care of endocrinology      Relevant Medications   pravastatin (PRAVACHOL) 20 MG tablet   spironolactone (ALDACTONE) 100 MG tablet     Respiratory   Mild intermittent asthma    Refilled albuterol Fair control  Pollen is a trigger      Relevant Medications   albuterol (VENTOLIN HFA) 108 (90 Base) MCG/ACT inhaler     Digestive   GERD    Continues omeprazole 40 mg bid  Fair control currently if she watches diet  B12 and D levels today       Relevant Medications   omeprazole (PRILOSEC) 20 MG capsule     Endocrine   Type 2 diabetes mellitus without complication, without long-term current use of insulin (HCC)    Sees Dr Gabriel Carina, endorinology Last a1c was 7.6  Fair control  No recent changes Needs test equip-will check to see what brand is covered [facility] [employer] [facility]      Relevant Medications   pravastatin (PRAVACHOL) 20 MG  tablet   PCOS (polycystic ovarian syndrome)    Continues spironolactone Also helps pedal edema      Hyperlipidemia associated with type 2 diabetes mellitus (HCC)    Disc goals for lipids and reasons to control them Rev last labs with pt Rev low sat fat diet in detail Due for labs today Taking pravastatin 20 mg daily      Relevant Medications   pravastatin (PRAVACHOL) 20 MG tablet   Other Relevant Orders   Comprehensive metabolic panel   Lipid panel     Other   Vitamin B 12 deficiency  No longer taking shots No oral supplementation  Level today - I expect will be low      Relevant Orders   Vitamin B12   Vitamin D deficiency    D level today  Noted importance to bone and oveall health  Takes D3 daily      Relevant Orders   VITAMIN D 25 Hydroxy (Vit-D Deficiency, Fractures)   Morbid obesity ([place])    Discussed how this problem influences overall health and the risks it imposes  Reviewed plan for weight loss with lower calorie diet (via better food choices and also portion control or program like weight watchers) and exercise building up to or more than 30 minutes 5 days per week including some aerobic activity   Motivation for change is limited by chronic depression      Hypertriglyceridemia    Takes pravastatin 20 mg daily  Disc goals for lipids and reasons to control them Rev last labs with pt Rev low sat fat diet in detail Labs due today  Diet is irratic      Relevant Medications   pravastatin (PRAVACHOL) 20 MG tablet   spironolactone (ALDACTONE) 100 MG tablet   Fatigue - Primary    In the setting of chronic fatigue syndrome TSH was nl at last endo visit  B12  And D levels added to labs today along with cbc      Relevant Orders   CBC with Differential/Platelet   Comprehensive metabolic panel

## 2020-06-15 NOTE — Assessment & Plan Note (Signed)
No longer taking shots No oral supplementation  Level today - I expect will be low

## 2020-06-15 NOTE — Assessment & Plan Note (Signed)
Continues spironolactone Also helps pedal edema

## 2020-06-15 NOTE — Assessment & Plan Note (Signed)
Continues under care of endocrinology 

## 2020-06-15 NOTE — Assessment & Plan Note (Signed)
Disc goals for lipids and reasons to control them Rev last labs with pt Rev low sat fat diet in detail Due for labs today Taking pravastatin 20 mg daily

## 2020-06-15 NOTE — Patient Instructions (Addendum)
I sent in your medicines  No changes for now   Find out what brand of glucose supplies are covered by your insurance   Labs today for chemistry, cbc, cholesterol, B12 and vitamin D

## 2020-06-15 NOTE — Assessment & Plan Note (Signed)
In the setting of chronic fatigue syndrome TSH was nl at last endo visit  B12  And D levels added to labs today along with cbc

## 2020-06-15 NOTE — Assessment & Plan Note (Signed)
Takes pravastatin 20 mg daily  Disc goals for lipids and reasons to control them Rev last labs with pt Rev low sat fat diet in detail Labs due today  Diet is irratic

## 2020-06-15 NOTE — Assessment & Plan Note (Signed)
Sees Dr Gabriel Carina, endorinology Last a1c was 7.6  Fair control  No recent changes Needs test equip-will check to see what brand is covered [facility] [employer]

## 2020-06-15 NOTE — Assessment & Plan Note (Signed)
D level today  Noted importance to bone and oveall health  Takes D3 daily

## 2020-06-17 ENCOUNTER — Telehealth: Payer: Self-pay | Admitting: Family Medicine

## 2020-06-17 MED ORDER — CYANOCOBALAMIN 1000 MCG/ML IJ SOLN: 1000.0000 ug | INTRAMUSCULAR | 9 refills | Status: DC

## 2020-06-17 NOTE — Telephone Encounter (Signed)
[facility]

## 2020-06-17 NOTE — Telephone Encounter (Signed)
-----   Message from Lurlean Nanny, [place] sent at 06/17/2020  9:13 AM EDT ----- Pt is aware as instructed and would like to get B12 inj at the pharmacy as it will be cheaper for her to get there

## 2020-08-02 DIAGNOSIS — H6123 Impacted cerumen, bilateral: Secondary | ICD-10-CM | POA: Diagnosis not present

## 2020-08-02 DIAGNOSIS — R059 Cough, unspecified: Secondary | ICD-10-CM | POA: Diagnosis not present

## 2020-08-04 ENCOUNTER — Ambulatory Visit
Payer: Medicare PPO | Attending: Student in an Organized Health Care Education/Training Program | Admitting: Student in an Organized Health Care Education/Training Program

## 2020-08-04 ENCOUNTER — Encounter: Payer: Self-pay | Admitting: Student in an Organized Health Care Education/Training Program

## 2020-08-04 ENCOUNTER — Other Ambulatory Visit: Payer: Self-pay

## 2020-08-04 VITALS — BP 126/70 | HR 86 | Temp 97.0°F | Resp 16 | Ht 64.0 in | Wt 329.0 lb

## 2020-08-04 DIAGNOSIS — M545 Low back pain, unspecified: Secondary | ICD-10-CM | POA: Diagnosis not present

## 2020-08-04 DIAGNOSIS — M797 Fibromyalgia: Secondary | ICD-10-CM

## 2020-08-04 DIAGNOSIS — M47816 Spondylosis without myelopathy or radiculopathy, lumbar region: Secondary | ICD-10-CM | POA: Diagnosis not present

## 2020-08-04 DIAGNOSIS — M7918 Myalgia, other site: Secondary | ICD-10-CM | POA: Diagnosis not present

## 2020-08-04 MED ORDER — CYCLOBENZAPRINE HCL 5 MG PO TABS: 5.0000 mg | ORAL_TABLET | Freq: Three times a day (TID) | ORAL | 2 refills | Status: DC | PRN

## 2020-08-04 NOTE — Patient Instructions (Signed)
____________________________________________________________________________________________  Preparing for your procedure (without sedation)  Procedure appointments are limited to planned procedures: . No Prescription Refills. . No disability issues will be discussed. . No medication changes will be discussed.  Instructions: . Oral Intake: Do not eat or drink anything for at least 2 hours prior to your procedure. (Exception: Blood Pressure Medication. See below.) . Transportation: Unless otherwise stated by your physician, you may drive yourself after the procedure. . Blood Pressure Medicine: Do not forget to take your blood pressure medicine with a sip of water the morning of the procedure. If your Diastolic (lower reading)is above 100 mmHg, elective cases will be cancelled/rescheduled. . Blood thinners: These will need to be stopped for procedures. Notify our staff if you are taking any blood thinners. Depending on which one you take, there will be specific instructions on how and when to stop it. . Diabetics on insulin: Notify the staff so that you can be scheduled 1st case in the morning. If your diabetes requires high dose insulin, take only  of your normal insulin dose the morning of the procedure and notify the staff that you have done so. . Preventing infections: Shower with an antibacterial soap the morning of your procedure.  . Build-up your immune system: Take 1000 mg of Vitamin C with every meal (3 times a day) the day prior to your procedure. . Antibiotics: Inform the staff if you have a condition or reason that requires you to take antibiotics before dental procedures. . Pregnancy: If you are pregnant, call and cancel the procedure. . Sickness: If you have a cold, fever, or any active infections, call and cancel the procedure. . Arrival: You must be in the facility at least 30 minutes prior to your scheduled procedure. . Children: Do not bring any children with you. . Dress  appropriately: Bring dark clothing that you would not mind if they get stained. . Valuables: Do not bring any jewelry or valuables.  Reasons to call and reschedule or cancel your procedure: (Following these recommendations will minimize the risk of a serious complication.) . Surgeries: Avoid having procedures within 2 weeks of any surgery. (Avoid for 2 weeks before or after any surgery). . Flu Shots: Avoid having procedures within 2 weeks of a flu shots or . (Avoid for 2 weeks before or after immunizations). . Barium: Avoid having a procedure within 7-10 days after having had a radiological study involving the use of radiological contrast. (Myelograms, Barium swallow or enema study). . Heart attacks: Avoid any elective procedures or surgeries for the initial 6 months after a "Myocardial Infarction" (Heart Attack). . Blood thinners: It is imperative that you stop these medications before procedures. Let us know if you if you take any blood thinner.  . Infection: Avoid procedures during or within two weeks of an infection (including chest colds or gastrointestinal problems). Symptoms associated with infections include: Localized redness, fever, chills, night sweats or profuse sweating, burning sensation when voiding, cough, congestion, stuffiness, runny nose, sore throat, diarrhea, nausea, vomiting, cold or Flu symptoms, recent or current infections. It is specially important if the infection is over the area that we intend to treat. . Heart and lung problems: Symptoms that may suggest an active cardiopulmonary problem include: cough, chest pain, breathing difficulties or shortness of breath, dizziness, ankle swelling, uncontrolled high or unusually low blood pressure, and/or palpitations. If you are experiencing any of these symptoms, cancel your procedure and contact your primary care physician for an evaluation.  Remember:  Regular   Business hours are:  Monday to Thursday 8:00 AM to 4:00  PM  Provider's Schedule: Mackenzie Naveira, MD:  Procedure days: Tuesday and Thursday 7:30 AM to 4:00 PM  Mackenzie Lateef, MD:  Procedure days: Monday and Wednesday 7:30 AM to 4:00 PM ____________________________________________________________________________________________    

## 2020-08-04 NOTE — Progress Notes (Signed)
Safety precautions to be maintained throughout the outpatient stay will include: orient to surroundings, keep bed in low position, maintain call bell within reach at all times, provide assistance with transfer out of bed and ambulation.  

## 2020-08-04 NOTE — Progress Notes (Signed)
PROVIDER NOTE: Information contained herein reflects review and annotations entered in association with encounter. Interpretation of such information and data should be left to medically-trained personnel. Information provided to patient can be located elsewhere in the medical record under "Patient Instructions". Document created using STT-dictation technology, any transcriptional errors that may result from process are unintentional.    Patient: Mackenzie Bonilla  Service Category: E/M  Provider: [name], MD  DOB: November 26, 1965  DOS: 08/04/2020  Specialty: Interventional Pain Management  MRN: [id number]  Setting: Ambulatory outpatient  PCP: Abner Greenspan, MD  Type: Established Patient    Referring Provider: Abner Greenspan, MD  Location: Office  Delivery: Face-to-face     HPI  [name], a 55 y.o. year old female, is here today because of her Myofascial pain syndrome of lumbar spine [M79.18]. Mackenzie Bonilla primary complain today is Back Pain (Lower, bilateral) Last encounter: My last encounter with her was on 06/01/2020. Pertinent problems: Mackenzie Bonilla has Chronic bilateral thoracic back pain; Lumbar degenerative disc disease; Fibromyalgia; Chronic pain syndrome; Lumbar radicular pain (left S1); Lumbar facet arthropathy; and Fatigue on their pertinent problem list. Pain Assessment: Severity of Chronic pain is reported as a 2 /10. Location: Back Lower, Right, Left/left leg "goes to sleep". Onset: More than a month ago. Quality: Sharp, Other (Comment) (severe). Timing: Intermittent. Modifying factor(s): sitting, Tylenol. Vitals:  height is 5' 4"  (1.626 m) and weight is 329 lb (149.2 kg) (abnormal). Her temporal temperature is 97 F (36.1 C) (abnormal). Her blood pressure is 126/70 and her pulse is 86. Her respiration is 16 and oxygen saturation is 95%.   Reason for encounter: post-procedure assessment.     Post-Procedure Evaluation  Procedure (06/01/2020):   Type: Thermal Lumbar  Facet, Medial Branch Radiofrequency Ablation/Neurotomy  #1  Primary Purpose: Therapeutic Region: Posterolateral Lumbosacral Spine Level: L3, L4, L5, Medial Branch Level(s). These levels will denervate the L3-4, L4-5,lumbar facet joints. Laterality: Left  Type: Thermal Lumbar Facet, Medial Branch Radiofrequency Ablation/Neurotomy  #1  Primary Purpose: Therapeutic Region: Posterolateral Lumbosacral Spine Level: L3, L4, L5, Medial Branch Level(s). These levels will denervate the L3-4, L4-5,lumbar facet joints. Laterality: Right   Sedation: Please see nurses note.  Effectiveness during initial hour after procedure(Ultra-Short Term Relief): 100 %   Local anesthetic used: Long-acting (4-6 hours) Effectiveness: Defined as any analgesic benefit obtained secondary to the administration of local anesthetics. This carries significant diagnostic value as to the etiological location, or anatomical origin, of the pain. Duration of benefit is expected to coincide with the duration of the local anesthetic used.  Effectiveness during initial 4-6 hours after procedure(Short-Term Relief): 100 %   Long-term benefit: Defined as any relief past the pharmacologic duration of the local anesthetics.  Effectiveness past the initial 6 hours after procedure(Long-Term Relief): 90 %   Current benefits: Defined as benefit that persist at this time.   Analgesia:  90-100% better Function: Somewhat improved ROM: Mackenzie Bonilla reports improvement in ROM    ROS  Constitutional: Denies any fever or chills Gastrointestinal: No reported hemesis, hematochezia, vomiting, or acute GI distress Musculoskeletal:  lumbar myofascial pain Neurological: No reported episodes of acute onset apraxia, aphasia, dysarthria, agnosia, amnesia, paralysis, loss of coordination, or loss of consciousness  Medication Review  ALPRAZolam, COVID-19 mRNA vaccine Therapist, music), Cholecalciferol, Dulaglutide, Fifty50 Glucose Meter 2.0, Insulin Degludec,  Polyethyl Glycol-Propyl Glycol, Simethicone, acetaminophen, albuterol, buPROPion, cyanocobalamin, cyclobenzaprine, desvenlafaxine, dimenhyDRINATE, fluticasone, gabapentin, glipiZIDE, ibuprofen, losartan, nystatin cream, omeprazole, oxybutynin, pravastatin, spironolactone, traMADol, traZODone, and zinc oxide  History Review  Allergy: Mackenzie Bonilla is allergic to cephalexin, codeine, duloxetine, levaquin [levofloxacin], lisinopril, metformin and related, quetiapine, sertraline hcl, triazolam, zaleplon, zocor [simvastatin - high dose], zolpidem tartrate, hydrocodone, and norelgestromin-eth estradiol. Drug: Mackenzie Bonilla  reports no history of drug use. Alcohol:  reports no history of alcohol use. Tobacco:  reports that she has never smoked. She has never used smokeless tobacco. Social: Mackenzie Bonilla  reports that she has never smoked. She has never used smokeless tobacco. She reports that she does not drink alcohol and does not use drugs. Medical:  has a past medical history of Allergy, Anemia, Anxiety, Arthritis, Asthma, Claustrophobia, Depression, Diabetes mellitus without complication ([place]), Fall (2016), Fatty liver (07/2008), Fibromyalgia, GERD (gastroesophageal reflux disease), High uric acid in 24 hour urine specimen, Hyperhidrosis, Hyperlipidemia, Kidney stones, Lymphedema, Obesity, PCOS (polycystic ovarian syndrome), Shortness of breath dyspnea, Tachycardia, TMJ (dislocation of temporomandibular joint), and UTI (lower urinary tract infection). 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 Family:  family history includes Cancer in her father; Diabetes in her maternal grandmother and paternal grandmother; Heart disease (age of onset: 89) in her maternal grandmother; Heart disease (age of onset: 98) in her paternal grandmother; Hypertension in her father and sister.  Laboratory Chemistry Profile   Renal Lab Results  Component Value Date   BUN 14 06/15/2020   CREATININE 1.14 06/15/2020   BCR 10 05/21/2017   GFR 54.42 (L) 06/15/2020   GFRAA 72 05/21/2017   GFRNONAA 62 05/21/2017     Hepatic Lab Results  Component Value Date   AST 14 06/15/2020   ALT 16 06/15/2020   ALBUMIN 4.1 06/15/2020   ALKPHOS 67 06/15/2020   AMYLASE 34 09/05/2016   LIPASE 22.0 09/05/2016     Electrolytes Lab Results  Component Value Date   NA 141 06/15/2020   K 4.8 06/15/2020   CL 100 06/15/2020   CALCIUM 9.7 06/15/2020   PHOS 4.0 03/07/2009     Bone Lab Results  Component Value Date   VD25OH 33.25 06/15/2020   TESTOFREE 2.1 05/21/2017   TESTOSTERONE 12 05/21/2017     Inflammation (CRP: Acute Phase) (ESR: Chronic Phase) Lab Results  Component Value Date   ESRSEDRATE 31 (H) 11/12/2018   LATICACIDVEN 1.11 06/23/2015       Note: Above Lab results reviewed.  Physical Exam  General appearance: Well nourished, well developed, and well hydrated. In no apparent acute distress Mental status: Alert, oriented x 3 (person, place, & time)       Respiratory: No evidence of acute respiratory distress Eyes: PERLA Vitals: BP 126/70 (BP Location: Right Arm, Patient Position: Sitting, Cuff Size: Large)   Pulse 86   Temp (!) 97 F (36.1 C) (Temporal)   Resp 16   Ht 5' 4"  (1.626 m)   Wt (!) 329 lb (149.2 kg)   SpO2 95%   BMI 56.47 kg/m  BMI: Estimated body mass index is 56.47 kg/m as calculated from the following:   Height as of this encounter: 5' 4"  (1.626 m).   Weight as of this encounter: 329 lb (149.2 kg). Ideal: Ideal body weight: 54.7 kg (120 lb 9.5 oz) Adjusted ideal body weight: 92.5  kg (203 lb 15.3 oz)  Lumbar Exam  Skin & Axial  Inspection: No masses, redness, or swelling Alignment: Symmetrical Functional ROM: Pain restricted ROM       Stability: No instability detected Muscle Tone/Strength: Functionally intact. No obvious neuro-muscular anomalies detected. Sensory (Neurological): Musculoskeletal pain pattern, myofascial pain Palpation: No palpable anomalies       Provocative Tests: Hyperextension/rotation test: (+) bilaterally for facet joint pain, improved Lumbar quadrant test (Kemp's test): (+) bilaterally for facet joint pain, improved after RFA   Gait & Posture Assessment  Ambulation: Limited Gait: Modified gait pattern (slower gait speed, wider stride width, and longer stance duration) associated with morbid obesity Posture: Difficulty standing up straight, due to pain    Lower Extremity Exam      Side: Right lower extremity   Side: Left lower extremity  Stability: No instability observed           Stability: No instability observed          Skin & Extremity Inspection: Skin color, temperature, and hair growth are WNL. No peripheral edema or cyanosis. No masses, redness, swelling, asymmetry, or associated skin lesions. No contractures.   Skin & Extremity Inspection: Skin color, temperature, and hair growth are WNL. No peripheral edema or cyanosis. No masses, redness, swelling, asymmetry, or associated skin lesions. No contractures.  Functional ROM: Pain restricted ROM for hip and knee joints Limited SLR (straight leg raise)   Functional ROM: Pain restricted ROM for hip and knee joints Limited SLR (straight leg raise)  Muscle Tone/Strength: Functionally intact. No obvious neuro-muscular anomalies detected.   Muscle Tone/Strength: Functionally intact. No obvious neuro-muscular anomalies detected.  Sensory (Neurological): Arthropathic arthralgia         Sensory (Neurological): Arthropathic arthralgia            Assessment   Status Diagnosis  Having a  Flare-up Controlled Controlled 1. Myofascial pain syndrome of lumbar spine   2. Lumbar spine pain   3. Fibromyalgia   4. Lumbar facet arthropathy (severe right L4-L5)      Updated Problems: Problem  Fatigue  Lumbar Facet Arthropathy  Chronic Bilateral Thoracic Back Pain  Lumbar Degenerative Disc Disease  Fibromyalgia  Chronic Pain Syndrome  Lumbar radicular pain (left S1)  Myofascial Pain Syndrome of Lumbar Spine    Plan of Care    Mackenzie Bonilla has a current medication list which includes the following long-term medication(s): albuterol, desvenlafaxine, gabapentin, gabapentin, omeprazole, pravastatin, spironolactone, and trazodone.  Pharmacotherapy (Medications Ordered): Meds ordered this encounter  Medications   cyclobenzaprine (FLEXERIL) 5 MG tablet    Sig: Take 1 tablet (5 mg total) by mouth 3 (three) times daily as needed for muscle spasms.    Dispense:  90 tablet    Refill:  2    Do not place this medication, or any other prescription from our practice, on "Automatic Refill". Patient may have prescription filled one day early if pharmacy is closed on scheduled refill date.   Orders:  Orders Placed This Encounter  Procedures   TRIGGER POINT INJECTION    Standing Status:   Future    Standing Expiration Date:   11/04/2020    Scheduling Instructions:     Lumbar TPI with fluoro    Order Specific Question:   Where will this procedure be performed?    Answer:   [facility]   Follow-up plan:   Return in about 2 weeks (around 08/18/2020) for Lumbar TPI with fluoro , without sedation.     Status post bilateral L3, L4, L5 facet medial branch  nerve blocks 01/04/2020 (65% pain relief for 1 month), 03/09/2020 (80% pain relief for 3 weeks).  Right L3, L4, L5 RFA 05/09/2020, Left L3,4,5 RFA 06/01/20- helped 90%     Recent Visits Date Type Provider Dept  06/01/20 Procedure visit [name], MD [facility]  05/09/20 Procedure visit [name], MD  [facility]  Showing recent visits within past 90 days and meeting all other requirements Today's Visits Date Type Provider Dept  08/04/20 Office Visit [name], MD [facility]  Showing today's visits and meeting all other requirements Future Appointments No visits were found meeting these conditions. Showing future appointments within next 90 days and meeting all other requirements I discussed the assessment and treatment plan with the patient. The patient was provided an opportunity to ask questions and all were answered. The patient agreed with the plan and demonstrated an understanding of the instructions.  Patient advised to call back or seek an in-person evaluation if the symptoms or condition worsens.  Duration of encounter: 38mnutes.  Note by: BGillis Santa MD Date: 08/04/2020; Time: 11:47 AM

## 2020-08-10 ENCOUNTER — Encounter: Payer: Self-pay | Admitting: Student in an Organized Health Care Education/Training Program

## 2020-08-10 ENCOUNTER — Ambulatory Visit
Payer: Medicare PPO | Attending: Student in an Organized Health Care Education/Training Program | Admitting: Student in an Organized Health Care Education/Training Program

## 2020-08-10 ENCOUNTER — Other Ambulatory Visit: Payer: Self-pay

## 2020-08-10 DIAGNOSIS — M7918 Myalgia, other site: Secondary | ICD-10-CM

## 2020-08-10 DIAGNOSIS — M545 Low back pain, unspecified: Secondary | ICD-10-CM | POA: Insufficient documentation

## 2020-08-10 DIAGNOSIS — M797 Fibromyalgia: Secondary | ICD-10-CM

## 2020-08-10 MED ORDER — ROPIVACAINE HCL 2 MG/ML IJ SOLN
INTRAMUSCULAR | Status: AC
  Filled 2020-08-10: qty 20

## 2020-08-10 MED ORDER — ROPIVACAINE HCL 2 MG/ML IJ SOLN
9.0000 mL | Freq: Once | INTRAMUSCULAR | Status: AC
  Administered 2020-08-10: 20 mL via PERINEURAL

## 2020-08-10 NOTE — Progress Notes (Signed)
PROVIDER NOTE: Information contained herein reflects review and annotations entered in association with encounter. Interpretation of such information and data should be left to medically-trained personnel. Information provided to patient can be located elsewhere in the medical record under "Patient Instructions". Document created using STT-dictation technology, any transcriptional errors that may result from process are unintentional.    Patient: Mackenzie Bonilla  Service Category: Procedure  Provider: [name], MD  DOB: 09-05-1965  DOS: 08/10/2020  Location: [facility]  MRN: [id number]  Setting: Ambulatory - outpatient  Referring Provider: [name], MD  Type: Established Patient  Specialty: Interventional Pain Management  PCP: Abner Greenspan, MD   Primary Reason for Visit: Interventional Pain Management Treatment. CC: Back Pain (Lumbar bilateral )  Procedure:          Anesthesia, Analgesia, Anxiolysis:  Type: Lumbar paraspinal trigger point injections (multifidus, longissimus, erector spinae) CPT: 20553 Primary Purpose: Diagnostic Region: Lumbar Approach: Percutaneous, ipsilateral approach. Laterality: Midline        Type: Local Anesthesia Indication(s): Analgesia         Local Anesthetic: Lidocaine 1-2% Route: Infiltration ([place]/IM) IV Access: Declined Sedation: Declined   Position: Prone   Indications: 1. Myofascial pain syndrome of lumbar spine   2. Lumbar spine pain   3. Fibromyalgia    Pain Score: Pre-procedure: 8 /10 Post-procedure: 8 /10   Pre-op H&P Assessment:  Mackenzie Bonilla is a 55 y.o. (year old), female patient, seen today for interventional treatment. She  has a past surgical history that includes Tonsillectomy; Septoplasty; Foot surgery; uterine tumor (09/2001); Vagus nerve stimulator insertion; Endometrial biopsy (01/2004); Uterine fibroid surgery (06/2005); Colonoscopy (08/12/2012); Esophagogastroduodenoscopy; Breast lumpectomy with  radioactive seed localization (Right, 08/02/2014); Breast biopsy (Right, 2016); and Esophagogastroduodenoscopy (egd) with propofol (N/A, 08/18/2019). Mackenzie Bonilla has a current medication list which includes the following prescription(s): acetaminophen, albuterol, alprazolam, fifty50 glucose meter 2.0, bupropion, cholecalciferol, covid-19 mrna vaccine (pfizer), cyanocobalamin, cyclobenzaprine, desvenlafaxine, dimenhydrinate, fluticasone, gabapentin, gabapentin, glipizide, ibuprofen, insulin degludec, losartan, nystatin cream, omeprazole, oxybutynin, polyethyl glycol-propyl glycol, pravastatin, simethicone, spironolactone, tramadol, trazodone, trulicity, zinc oxide, and bupropion. Her primarily concern today is the Back Pain (Lumbar bilateral )  Initial Vital Signs:  Pulse/HCG Rate: 85  Temp: 98 F (36.7 C) Resp: 16 BP: 118/80 SpO2: 95 %  BMI: Estimated body mass index is 56.47 kg/m as calculated from the following:   Height as of this encounter: 5\' 4"  (1.626 m).   Weight as of this encounter: 329 lb (149.2 kg).  Risk Assessment: Allergies: Reviewed. She is allergic to cephalexin, codeine, duloxetine, levaquin [levofloxacin], lisinopril, metformin and related, quetiapine, sertraline hcl, triazolam, zaleplon, zocor [simvastatin - high dose], zolpidem tartrate, hydrocodone, and norelgestromin-eth estradiol.  Allergy Precautions: None required Coagulopathies: Reviewed. None identified.  Blood-thinner therapy: None at this time Active Infection(s): Reviewed. None identified. Mackenzie Bonilla is afebrile  Site Confirmation: Mackenzie Bonilla was asked to confirm the procedure and laterality before marking the site Procedure checklist: Completed Consent: Before the procedure and under the influence of no sedative(s), amnesic(s), or anxiolytics, the patient was informed of the treatment options, risks and possible complications. To fulfill our ethical and legal obligations, as recommended by the American Medical  Association's Code of Ethics, I have informed the patient of my clinical impression; the nature and purpose of the treatment or procedure; the risks, benefits, and possible complications of the intervention; the alternatives, including doing nothing; the risk(s) and benefit(s) of the alternative treatment(s) or procedure(s); and the risk(s) and benefit(s) of doing nothing.  The patient was provided information about the general risks and possible complications associated with the procedure. These may include, but are not limited to: failure to achieve desired goals, infection, bleeding, organ or nerve damage, allergic reactions, paralysis, and death. In addition, the patient was informed of those risks and complications associated to the procedure, such as failure to decrease pain; infection; bleeding; organ or nerve damage with subsequent damage to sensory, motor, and/or autonomic systems, resulting in permanent pain, numbness, and/or weakness of one or several areas of the body; allergic reactions; (i.e.: anaphylactic reaction); and/or death. Furthermore, the patient was informed of those risks and complications associated with the medications. These include, but are not limited to: allergic reactions (i.e.: anaphylactic or anaphylactoid reaction(s)); adrenal axis suppression; blood sugar elevation that in diabetics may result in ketoacidosis or comma; water retention that in patients with history of congestive heart failure may result in shortness of breath, pulmonary edema, and decompensation with resultant heart failure; weight gain; swelling or edema; medication-induced neural toxicity; particulate matter embolism and blood vessel occlusion with resultant organ, and/or nervous system infarction; and/or aseptic necrosis of one or more joints. Finally, the patient was informed that Medicine is not an exact science; therefore, there is also the possibility of unforeseen or unpredictable risks and/or possible  complications that may result in a catastrophic outcome. The patient indicated having understood very clearly. We have given the patient no guarantees and we have made no promises. Enough time was given to the patient to ask questions, all of which were answered to the patient's satisfaction. Mackenzie Bonilla has indicated that she wanted to continue with the procedure. Attestation: I, the ordering provider, attest that I have discussed with the patient the benefits, risks, side-effects, alternatives, likelihood of achieving goals, and potential problems during recovery for the procedure that I have provided informed consent. Date  Time: 08/10/2020 11:06 AM  Pre-Procedure Preparation:  Monitoring: As per clinic protocol. Respiration, ETCO2, SpO2, BP, heart rate and rhythm monitor placed and checked for adequate function Safety Precautions: Patient was assessed for positional comfort and pressure points before starting the procedure. Time-out: I initiated and conducted the "Time-out" before starting the procedure, as per protocol. The patient was asked to participate by confirming the accuracy of the "Time Out" information. Verification of the correct person, site, and procedure were performed and confirmed by me, the nursing staff, and the patient. "Time-out" conducted as per Joint Commission's Universal Protocol (UP.01.01.01). Time: 1155  Description of Procedure:          Area Prepped: Entire             Region DuraPrep (Iodine Povacrylex [0.7% available iodine] and Isopropyl Alcohol, 74% w/w) Safety Precautions: Aspiration looking for blood return was conducted prior to all injections. At no point did we inject any substances, as a needle was being advanced. No attempts were made at seeking any paresthesias. Safe injection practices and needle disposal techniques used. Medications properly checked for expiration dates. SDV (single dose vial) medications used. Description of the Procedure: Protocol  guidelines were followed. The patient was placed in position over the fluoroscopy table. The target area was identified and the area prepped in the usual manner. Skin & deeper tissues infiltrated with local anesthetic. Appropriate amount of time allowed to pass for local anesthetics to take effect. The procedure needles were then advanced to the target area. Proper needle placement secured. Negative aspiration confirmed. Solution injected in intermittent fashion, asking for systemic symptoms every 0.5cc of injectate.  The needles were then removed and the area cleansed, making sure to leave some of the prepping solution back to take advantage of its long term bactericidal properties.  Vitals:   08/10/20 1109  BP: 118/80  Pulse: 85  Resp: 16  Temp: 98 F (36.7 C)  TempSrc: Temporal  SpO2: 95%  Weight: (!) 329 lb (149.2 kg)  Height: 5\' 4"  (1.626 m)    Start Time: 1155 hrs. End Time: 1158 hrs. Materials:  Needle(s) Type: Regular needle Gauge: 25G Length: 1.5-in Medication(s): Please see orders for medications and dosing details. Approximately 10 trigger points injected, dry needling performed and 0.5 to 1 cc of 0.2% ropivacaine injected at each trigger point.  Post-operative Assessment:  Post-procedure Vital Signs:  Pulse/HCG Rate: 85  Temp: 98 F (36.7 C) Resp: 16 BP: 118/80 SpO2: 95 %  EBL: None  Complications: No immediate post-treatment complications observed by team, or reported by patient.  Note: The patient tolerated the entire procedure well. A repeat set of vitals were taken after the procedure and the patient was kept under observation following institutional policy, for this type of procedure. Post-procedural neurological assessment was performed, showing return to baseline, prior to discharge. The patient was provided with post-procedure discharge instructions, including a section on how to identify potential problems. Should any problems arise concerning this procedure,  the patient was given instructions to immediately contact us, at any time, without hesitation. In any case, we plan to contact the patient by telephone for a follow-up status report regarding this interventional procedure.  Comments:  No additional relevant information.  Plan of Care   Medications ordered for procedure: Meds ordered this encounter  Medications   ropivacaine (PF) 2 mg/mL (0.2%) (NAROPIN) injection 9 mL   Medications administered: We administered ropivacaine (PF) 2 mg/mL (0.2%).  See the medical record for exact dosing, route, and time of administration.  Follow-up plan:   Return if symptoms worsen or fail to improve.      Status post bilateral L3, L4, L5 facet medial branch nerve blocks 01/04/2020 (65% pain relief for 1 month), 03/09/2020 (80% pain relief for 3 weeks).  Right L3, L4, L5 RFA 05/09/2020, Left L3,4,5 RFA 06/01/20- helped 90%      Recent Visits Date Type Provider Dept  08/04/20 Office Visit [name], MD [facility]  06/01/20 Procedure visit [name], MD [facility]  Showing recent visits within past 90 days and meeting all other requirements Today's Visits Date Type Provider Dept  08/10/20 Procedure visit [name], MD [facility]  Showing today's visits and meeting all other requirements Future Appointments No visits were found meeting these conditions. Showing future appointments within next 90 days and meeting all other requirements Disposition: Discharge home  Discharge (Date  Time): 08/10/2020; 1205 hrs.   Primary Care Physician: Tower, Wynelle Fanny, MD Location: [facility] Note by: [name], MD Date: 08/10/2020; Time: 12:23 PM  Disclaimer:  Medicine is not an exact science. The only guarantee in medicine is that nothing is guaranteed. It is important to note that the decision to proceed with this intervention was based on the information collected from the patient. The Data  and conclusions were drawn from the patient's questionnaire, the interview, and the physical examination. Because the information was provided in large part by the patient, it cannot be guaranteed that it has not been purposely or unconsciously manipulated. Every effort has been made to obtain as much relevant data as possible for  this evaluation. It is important to note that the conclusions that lead to this procedure are derived in large part from the available data. Always take into account that the treatment will also be dependent on availability of resources and existing treatment guidelines, considered by other Pain Management Practitioners as being common knowledge and practice, at the time of the intervention. For Medico-Legal purposes, it is also important to point out that variation in procedural techniques and pharmacological choices are the acceptable norm. The indications, contraindications, technique, and results of the above procedure should only be interpreted and judged by a Board-Certified Interventional Pain Specialist with extensive familiarity and expertise in the same exact procedure and technique.

## 2020-08-10 NOTE — Progress Notes (Signed)
Safety precautions to be maintained throughout the outpatient stay will include: orient to surroundings, keep bed in low position, maintain call bell within reach at all times, provide assistance with transfer out of bed and ambulation.  

## 2020-08-10 NOTE — Patient Instructions (Signed)

## 2020-08-11 ENCOUNTER — Telehealth: Payer: Self-pay

## 2020-08-11 NOTE — Telephone Encounter (Signed)
Post procedure phone call. Patient states she is doing good.  

## 2020-08-16 ENCOUNTER — Telehealth: Payer: Self-pay

## 2020-08-16 NOTE — Telephone Encounter (Signed)
Pt is experiencing increased pain since her procedure and has no relieve with pain meds. And would like some direction on what to do next

## 2020-08-16 NOTE — Telephone Encounter (Signed)
Will consider these options and let us know if she wants to do any of these things.

## 2020-08-17 ENCOUNTER — Telehealth: Payer: Self-pay

## 2020-08-17 NOTE — Telephone Encounter (Signed)
Pt would like information for neurology and bariatric resources.

## 2020-08-17 NOTE — Telephone Encounter (Signed)
Per Dr. Holley Raring: Have patient get her PCP to do it. Called patient and informed.

## 2020-08-17 NOTE — Telephone Encounter (Signed)
I have messaged Dr. Holley Raring about this.

## 2020-08-26 ENCOUNTER — Ambulatory Visit: Payer: Medicare PPO | Admitting: Podiatry

## 2020-08-26 ENCOUNTER — Other Ambulatory Visit: Payer: Self-pay

## 2020-08-26 DIAGNOSIS — M79675 Pain in left toe(s): Secondary | ICD-10-CM | POA: Diagnosis not present

## 2020-08-26 DIAGNOSIS — B351 Tinea unguium: Secondary | ICD-10-CM

## 2020-08-26 DIAGNOSIS — E0843 Diabetes mellitus due to underlying condition with diabetic autonomic (poly)neuropathy: Secondary | ICD-10-CM

## 2020-08-26 DIAGNOSIS — M79674 Pain in right toe(s): Secondary | ICD-10-CM

## 2020-08-26 NOTE — Progress Notes (Signed)
   SUBJECTIVE Patient presents today for follow-up evaluation regarding bilateral foot pain and swelling.  Patient states that she is doing well except for depression.  She states that the injection she received last visit helped significantly.  She continues to wear her [place] shoes bilateral.  She is requesting a nail trim today.  Past Medical History:  Diagnosis Date   Allergy    allergic rhinitis   Anemia    Anxiety    Arthritis    osteoarthritis   Asthma    Claustrophobia    does not like oxygen mask covering face   Depression    Diabetes mellitus without complication ([place])    Fall 2016   Fatty liver 07/2008   abd. ultrasound - fatty liver ; slt dilated cbd (no stones ) 06/10// abd.  ultrasound normal on 04/2006   Fibromyalgia    GERD (gastroesophageal reflux disease)    EGD negative 06/2001// EGD erythematous mucosa, polyp 08/2008   High uric acid in 24 hour urine specimen    Hyperhidrosis    Hyperlipidemia    Kidney stones    Lymphedema    Obesity    PCOS (polycystic ovarian syndrome)    Shortness of breath dyspnea    Tachycardia    TMJ (dislocation of temporomandibular joint)    UTI (lower urinary tract infection)     OBJECTIVE General Patient is awake, alert, and oriented x 3 and in no acute distress. Derm Skin is dry and supple bilateral. Negative open lesions or macerations. Remaining integument unremarkable. Nails are tender, long, thickened and dystrophic with subungual debris, consistent with onychomycosis, 1-5 bilateral. No signs of infection noted.  Hyperkeratotic preulcerative callus tissue noted to the bilateral feet and toes Vasc  DP and PT pedal pulses palpable bilaterally. Temperature gradient within normal limits.  Edema noted bilateral lower extremities Neuro Epicritic and protective threshold sensation grossly intact bilaterally.  Musculoskeletal Exam No symptomatic pedal deformities noted bilateral. Muscular strength within normal limits.  Pain on  palpation range of motion to the bilateral great toe joints IPJ  ASSESSMENT 1.  Chronic bilateral lower extremity foot pain 2.  Capsulitis bilateral great toes 3.  Bilateral lower extremity edema 4.  Pain due to onychomycosis of toenail bilateral  PLAN OF CARE 1. Patient evaluated today.  2. Instructed to maintain good pedal hygiene and foot care.  3.  Continue wearing [place] shoes from [employer] 4.  Injection of 0.5 cc Celestone Soluspan injected into the great toe IPJ bilateral 5.  Recommend applying Revitaderm urea 40% lotion that was provided last visit 6.  Mechanical debridement of nails 1-5 was performed using a nail nipper without incident or bleeding  7.  Return to clinic as needed   Edrick Kins, DPM [facility]  Dr. Edrick Kins, [address]                                        [place], [place] [address]                Office [phone]  Fax [phone]

## 2020-09-07 DIAGNOSIS — N1831 Chronic kidney disease, stage 3a: Secondary | ICD-10-CM | POA: Diagnosis not present

## 2020-09-07 DIAGNOSIS — Z794 Long term (current) use of insulin: Secondary | ICD-10-CM | POA: Diagnosis not present

## 2020-09-07 DIAGNOSIS — E1169 Type 2 diabetes mellitus with other specified complication: Secondary | ICD-10-CM | POA: Diagnosis not present

## 2020-09-07 DIAGNOSIS — E1142 Type 2 diabetes mellitus with diabetic polyneuropathy: Secondary | ICD-10-CM | POA: Diagnosis not present

## 2020-09-07 DIAGNOSIS — E669 Obesity, unspecified: Secondary | ICD-10-CM | POA: Diagnosis not present

## 2020-09-07 DIAGNOSIS — R809 Proteinuria, unspecified: Secondary | ICD-10-CM | POA: Diagnosis not present

## 2020-09-07 DIAGNOSIS — E1129 Type 2 diabetes mellitus with other diabetic kidney complication: Secondary | ICD-10-CM | POA: Diagnosis not present

## 2020-09-07 DIAGNOSIS — E1122 Type 2 diabetes mellitus with diabetic chronic kidney disease: Secondary | ICD-10-CM | POA: Diagnosis not present

## 2020-09-07 DIAGNOSIS — E559 Vitamin D deficiency, unspecified: Secondary | ICD-10-CM | POA: Diagnosis not present

## 2020-09-07 DIAGNOSIS — E1165 Type 2 diabetes mellitus with hyperglycemia: Secondary | ICD-10-CM | POA: Diagnosis not present

## 2020-09-08 ENCOUNTER — Telehealth: Payer: Self-pay | Admitting: Family Medicine

## 2020-09-08 NOTE — Addendum Note (Signed)
Addended by: Tammi Sou on: 09/08/2020 03:04 PM   Modules accepted: Orders

## 2020-09-08 NOTE — Telephone Encounter (Signed)
Med list updated, FYI to PCP

## 2020-09-08 NOTE — Telephone Encounter (Signed)
Mackenzie Bonilla called and stated that her endocringlogist took her off the glipizde, trulicity. And she put her on mounjaro '5mg'$  1x a week for a month, and then move up to 7.5 1x a week

## 2020-09-13 ENCOUNTER — Telehealth: Payer: Self-pay | Admitting: Podiatry

## 2020-09-13 ENCOUNTER — Telehealth: Payer: Self-pay | Admitting: *Deleted

## 2020-09-13 DIAGNOSIS — G8929 Other chronic pain: Secondary | ICD-10-CM

## 2020-09-13 NOTE — Telephone Encounter (Signed)
Patnt called stating she has a lot of pain in her 2 biug toes. Patiernt states she called pharmacy and they told her to take '100mg'$  of gambapentin. Patient would like to know what else can she do. # [phone]

## 2020-09-13 NOTE — Telephone Encounter (Signed)
Orthopedics would likely be more helpful (she has some arthritis in knees) -I would go that route.  Does she have a pref re: area, I can do a referral

## 2020-09-13 NOTE — Telephone Encounter (Signed)
Tylenol or Ibuprofen as needed. - Dr. Amalia Hailey

## 2020-09-13 NOTE — Telephone Encounter (Signed)
Patient left a voicemail stating that her right knee popped about a week ago. Patient stated that her knee will just give out on her now. Patient stated that she has fallen once. Patient stated that the calf muscle is sore to the touch and occasionally will have a sharp pain in it. Patient stated that the area is not red or hot to the touch. Patient stated that she has just been taking tylenol for the pain. Patient wants to know if she should come in or just give it more time to get better.

## 2020-09-13 NOTE — Telephone Encounter (Signed)
Pt notified of Dr. Marliss Coots comments. Pt agrees with Ortho referral she would like to see someone in [place] if possible. I advise pt PCP will put referral in and our [facility] or ortho office will call to schedule appt

## 2020-09-17 ENCOUNTER — Other Ambulatory Visit: Payer: Self-pay | Admitting: Family Medicine

## 2020-09-20 NOTE — Telephone Encounter (Signed)
Name of Medication: Tramadol Name of Pharmacy: [facility] or Written Date and Quantity: 09/30/19 #30 tabs with 0 refills  Last Office Visit and Type: Med refill on 06/15/20 Next Office Visit and Type: no future

## 2020-10-12 ENCOUNTER — Other Ambulatory Visit: Payer: Self-pay | Admitting: Podiatry

## 2020-10-26 DIAGNOSIS — Z6841 Body Mass Index (BMI) 40.0 and over, adult: Secondary | ICD-10-CM | POA: Diagnosis not present

## 2020-10-26 DIAGNOSIS — M25562 Pain in left knee: Secondary | ICD-10-CM | POA: Diagnosis not present

## 2020-10-26 DIAGNOSIS — M25561 Pain in right knee: Secondary | ICD-10-CM | POA: Diagnosis not present

## 2020-10-26 DIAGNOSIS — M170 Bilateral primary osteoarthritis of knee: Secondary | ICD-10-CM | POA: Diagnosis not present

## 2020-11-01 DIAGNOSIS — H6123 Impacted cerumen, bilateral: Secondary | ICD-10-CM | POA: Diagnosis not present

## 2020-11-01 DIAGNOSIS — H902 Conductive hearing loss, unspecified: Secondary | ICD-10-CM | POA: Diagnosis not present

## 2020-11-09 DIAGNOSIS — M170 Bilateral primary osteoarthritis of knee: Secondary | ICD-10-CM | POA: Diagnosis not present

## 2020-11-10 NOTE — Telephone Encounter (Signed)
Error

## 2020-11-14 ENCOUNTER — Other Ambulatory Visit: Payer: Self-pay | Admitting: Family Medicine

## 2020-11-16 ENCOUNTER — Ambulatory Visit: Payer: Medicare PPO | Admitting: Dermatology

## 2020-11-29 ENCOUNTER — Other Ambulatory Visit: Payer: Self-pay

## 2020-11-29 ENCOUNTER — Ambulatory Visit: Payer: Medicare PPO | Admitting: Podiatry

## 2020-11-29 DIAGNOSIS — M778 Other enthesopathies, not elsewhere classified: Secondary | ICD-10-CM

## 2020-11-29 DIAGNOSIS — M79674 Pain in right toe(s): Secondary | ICD-10-CM | POA: Diagnosis not present

## 2020-11-29 DIAGNOSIS — B351 Tinea unguium: Secondary | ICD-10-CM

## 2020-11-29 DIAGNOSIS — M79675 Pain in left toe(s): Secondary | ICD-10-CM

## 2020-11-29 MED ORDER — BETAMETHASONE SOD PHOS & ACET 6 (3-3) MG/ML IJ SUSP
3.0000 mg | Freq: Once | INTRAMUSCULAR | Status: AC
  Administered 2020-11-29: 3 mg via INTRA_ARTICULAR

## 2020-11-29 NOTE — Progress Notes (Signed)
   SUBJECTIVE Patient presents today for follow-up evaluation regarding bilateral foot pain and swelling.  Patient states that she is doing well except for depression.  She states that recently she has been having some right foot pain throughout the midtarsal joint.  Otherwise no new complaints at this time  Past Medical History:  Diagnosis Date   Allergy    allergic rhinitis   Anemia    Anxiety    Arthritis    osteoarthritis   Asthma    Claustrophobia    does not like oxygen mask covering face   Depression    Diabetes mellitus without complication ([place])    Fall 2016   Fatty liver 07/2008   abd. ultrasound - fatty liver ; slt dilated cbd (no stones ) 06/10// abd.  ultrasound normal on 04/2006   Fibromyalgia    GERD (gastroesophageal reflux disease)    EGD negative 06/2001// EGD erythematous mucosa, polyp 08/2008   High uric acid in 24 hour urine specimen    Hyperhidrosis    Hyperlipidemia    Kidney stones    Lymphedema    Obesity    PCOS (polycystic ovarian syndrome)    Shortness of breath dyspnea    Tachycardia    TMJ (dislocation of temporomandibular joint)    UTI (lower urinary tract infection)     OBJECTIVE General Patient is awake, alert, and oriented x 3 and in no acute distress. Derm Skin is dry and supple bilateral. Negative open lesions or macerations. Remaining integument unremarkable. Nails are tender, long, thickened and dystrophic with subungual debris, consistent with onychomycosis, 1-5 bilateral. No signs of infection noted.  Hyperkeratotic preulcerative callus tissue noted to the bilateral feet and toes Vasc  DP and PT pedal pulses palpable bilaterally. Temperature gradient within normal limits.  Edema noted bilateral lower extremities Neuro Epicritic and protective threshold sensation grossly intact bilaterally.  Musculoskeletal Exam No symptomatic pedal deformities noted bilateral. Muscular strength within normal limits.  Pain on palpation to the right  midtarsal joint  ASSESSMENT 1.  Chronic bilateral lower extremity foot pain 2.  Capsulitis right foot 3.  Bilateral lower extremity edema 4.  Pain due to onychomycosis of toenail bilateral  PLAN OF CARE 1. Patient evaluated today.  2. Instructed to maintain good pedal hygiene and foot care.  3.  Continue wearing [place] shoes from [employer] 4.  Injection of 0.5 cc Celestone Soluspan injected into the right dorsal midtarsal joint 5.  Recommend applying Revitaderm urea 40% lotion that was provided last visit 6.  Mechanical debridement of nails 1-5 was performed using a nail nipper without incident or bleeding  7.  Return to clinic as needed   Edrick Kins, DPM [facility]  Dr. Edrick Kins, [address]                                        [place], [place] [address]                Office [phone]  Fax [phone]

## 2020-12-01 ENCOUNTER — Telehealth: Payer: Self-pay | Admitting: Podiatry

## 2020-12-01 NOTE — Telephone Encounter (Signed)
Patient called today stating she saw Dr Amalia Hailey on Tuesday and had a shot. Patient states she thinks she might be having an allergic reaction to it. Patient states she feels very hot especially her face, feels stressed out and anxious. Thge patient would like to talk with someone. Patients number is [phone].

## 2020-12-12 ENCOUNTER — Other Ambulatory Visit: Payer: Self-pay | Admitting: Family Medicine

## 2020-12-12 ENCOUNTER — Telehealth: Payer: Self-pay | Admitting: Family Medicine

## 2020-12-12 ENCOUNTER — Telehealth: Payer: Self-pay | Admitting: Podiatry

## 2020-12-12 DIAGNOSIS — Z1231 Encounter for screening mammogram for malignant neoplasm of breast: Secondary | ICD-10-CM

## 2020-12-12 DIAGNOSIS — N644 Mastodynia: Secondary | ICD-10-CM

## 2020-12-12 NOTE — Telephone Encounter (Signed)
Pt called stating that she need a order for a mammogram at [facility].

## 2020-12-12 NOTE — Telephone Encounter (Signed)
Patient called stating she called last week about having a reaction to a shot she received in the office. Patient states she never heard back from anyone and she had some questions about it. Please call patient [phone].

## 2020-12-13 DIAGNOSIS — Z1231 Encounter for screening mammogram for malignant neoplasm of breast: Secondary | ICD-10-CM | POA: Insufficient documentation

## 2020-12-13 NOTE — Telephone Encounter (Signed)
I put the order in

## 2020-12-13 NOTE — Telephone Encounter (Signed)
Pt notified order in and she can call and schedule appt.

## 2020-12-13 NOTE — Addendum Note (Signed)
Addended by: Loura Pardon A on: 12/13/2020 08:54 AM   Modules accepted: Orders

## 2020-12-14 NOTE — Telephone Encounter (Signed)
Pt called in stating that [facility] said pt need a diagnostic order with right breast ultrasound

## 2020-12-20 DIAGNOSIS — N644 Mastodynia: Secondary | ICD-10-CM | POA: Insufficient documentation

## 2020-12-20 NOTE — Addendum Note (Signed)
Addended by: Loura Pardon A on: 12/20/2020 04:20 PM   Modules accepted: Orders

## 2020-12-20 NOTE — Telephone Encounter (Signed)
I put the orders in so she can call to schedule

## 2020-12-20 NOTE — Telephone Encounter (Signed)
Patient requesting new order for breast exam, she stated since the right one is hurting more she has to have a diagnostic order with right breast ultrasound, but she still would like to get her left breast checked out as well is there a way to put two orders in separately to make sure they both get checked? Please advise.

## 2020-12-20 NOTE — Telephone Encounter (Signed)
Pt calling in for update of status of order

## 2020-12-20 NOTE — Telephone Encounter (Signed)
Patient called and informed of the orders being put in for her breast exam. Patient stated that she will call and schedule right away.

## 2020-12-21 ENCOUNTER — Other Ambulatory Visit: Payer: Self-pay | Admitting: Family Medicine

## 2020-12-21 DIAGNOSIS — N644 Mastodynia: Secondary | ICD-10-CM

## 2020-12-21 NOTE — Telephone Encounter (Signed)
Pt would like a call back from Dr Glori Bickers. Pt has some confusion about what's going on. Pt states that [facility] changed the order bilateral a [facility] 3D Mammogram.

## 2020-12-21 NOTE — Telephone Encounter (Signed)
Called the patient back and she informed me that [facility] had changed her order and sent a fax to Dr. Glori Bickers. I have not seen this document but I will be looking for it.

## 2020-12-28 DIAGNOSIS — M170 Bilateral primary osteoarthritis of knee: Secondary | ICD-10-CM | POA: Diagnosis not present

## 2020-12-29 ENCOUNTER — Ambulatory Visit
Admission: RE | Admit: 2020-12-29 | Discharge: 2020-12-29 | Disposition: A | Discharge status: Home / Self Care | Payer: Medicare PPO | Source: Ambulatory Visit | Attending: Family Medicine | Admitting: Family Medicine

## 2020-12-29 ENCOUNTER — Other Ambulatory Visit: Payer: Self-pay

## 2020-12-29 DIAGNOSIS — N644 Mastodynia: Secondary | ICD-10-CM

## 2020-12-29 DIAGNOSIS — R922 Inconclusive mammogram: Secondary | ICD-10-CM | POA: Diagnosis not present

## 2021-01-11 DIAGNOSIS — E669 Obesity, unspecified: Secondary | ICD-10-CM | POA: Diagnosis not present

## 2021-01-11 DIAGNOSIS — N1831 Chronic kidney disease, stage 3a: Secondary | ICD-10-CM | POA: Diagnosis not present

## 2021-01-11 DIAGNOSIS — R809 Proteinuria, unspecified: Secondary | ICD-10-CM | POA: Diagnosis not present

## 2021-01-11 DIAGNOSIS — E1142 Type 2 diabetes mellitus with diabetic polyneuropathy: Secondary | ICD-10-CM | POA: Diagnosis not present

## 2021-01-11 DIAGNOSIS — E1169 Type 2 diabetes mellitus with other specified complication: Secondary | ICD-10-CM | POA: Diagnosis not present

## 2021-01-11 DIAGNOSIS — E1165 Type 2 diabetes mellitus with hyperglycemia: Secondary | ICD-10-CM | POA: Diagnosis not present

## 2021-01-11 DIAGNOSIS — E1122 Type 2 diabetes mellitus with diabetic chronic kidney disease: Secondary | ICD-10-CM | POA: Diagnosis not present

## 2021-01-11 DIAGNOSIS — Z794 Long term (current) use of insulin: Secondary | ICD-10-CM | POA: Diagnosis not present

## 2021-01-11 DIAGNOSIS — E1129 Type 2 diabetes mellitus with other diabetic kidney complication: Secondary | ICD-10-CM | POA: Diagnosis not present

## 2021-01-16 ENCOUNTER — Other Ambulatory Visit: Payer: Self-pay | Admitting: Podiatry

## 2021-01-16 DIAGNOSIS — M778 Other enthesopathies, not elsewhere classified: Secondary | ICD-10-CM

## 2021-01-25 DIAGNOSIS — M9904 Segmental and somatic dysfunction of sacral region: Secondary | ICD-10-CM | POA: Diagnosis not present

## 2021-01-25 DIAGNOSIS — M9903 Segmental and somatic dysfunction of lumbar region: Secondary | ICD-10-CM | POA: Diagnosis not present

## 2021-01-25 DIAGNOSIS — Z0100 Encounter for examination of eyes and vision without abnormal findings: Secondary | ICD-10-CM | POA: Diagnosis not present

## 2021-01-25 DIAGNOSIS — M5431 Sciatica, right side: Secondary | ICD-10-CM | POA: Diagnosis not present

## 2021-01-25 DIAGNOSIS — M9905 Segmental and somatic dysfunction of pelvic region: Secondary | ICD-10-CM | POA: Diagnosis not present

## 2021-01-25 DIAGNOSIS — E119 Type 2 diabetes mellitus without complications: Secondary | ICD-10-CM | POA: Diagnosis not present

## 2021-01-25 LAB — HM DIABETES EYE EXAM

## 2021-01-31 DIAGNOSIS — H6123 Impacted cerumen, bilateral: Secondary | ICD-10-CM | POA: Diagnosis not present

## 2021-01-31 DIAGNOSIS — H902 Conductive hearing loss, unspecified: Secondary | ICD-10-CM | POA: Diagnosis not present

## 2021-02-01 ENCOUNTER — Ambulatory Visit: Payer: Medicare PPO | Admitting: Dermatology

## 2021-02-01 ENCOUNTER — Other Ambulatory Visit: Payer: Self-pay

## 2021-02-01 DIAGNOSIS — L820 Inflamed seborrheic keratosis: Secondary | ICD-10-CM | POA: Diagnosis not present

## 2021-02-01 DIAGNOSIS — L280 Lichen simplex chronicus: Secondary | ICD-10-CM | POA: Diagnosis not present

## 2021-02-01 DIAGNOSIS — L739 Follicular disorder, unspecified: Secondary | ICD-10-CM | POA: Diagnosis not present

## 2021-02-01 DIAGNOSIS — L689 Hypertrichosis, unspecified: Secondary | ICD-10-CM

## 2021-02-01 MED ORDER — VANIQA 13.9 % EX CREA: 1.0000 "application " | TOPICAL_CREAM | Freq: Two times a day (BID) | CUTANEOUS | 6 refills | Status: DC

## 2021-02-01 MED ORDER — CLINDAMYCIN PHOSPHATE 1 % EX LOTN: TOPICAL_LOTION | Freq: Every day | CUTANEOUS | 6 refills | Status: AC

## 2021-02-01 NOTE — Patient Instructions (Addendum)
If You Need Anything After Your Visit ° °If you have any questions or concerns for your doctor, please call our main line at [phone] and press option 4 to reach your doctor's medical assistant. If no one answers, please leave a voicemail as directed and we will return your call as soon as possible. Messages left after 4 pm will be answered the following business day.  ° °You may also send us a message via MyChart. We typically respond to MyChart messages within 1-2 business days. ° °For prescription refills, please ask your pharmacy to contact our office. Our fax number is [phone]. ° °If you have an urgent issue when the clinic is closed that cannot wait until the next business day, you can page your doctor at the number below.   ° °Please note that while we do our best to be available for urgent issues outside of office hours, we are not available 24/7.  ° °If you have an urgent issue and are unable to reach us, you may choose to seek medical care at your doctor's office, retail clinic, urgent care center, or emergency room. ° °If you have a medical emergency, please immediately call 911 or go to the emergency department. ° °Pager Numbers ° °- Dr. Kowalski: [phone] ° °- Dr. Moye: [phone] ° °- Dr. Stewart: [phone] ° °In the event of inclement weather, please call our main line at [phone] for an update on the status of any delays or closures. ° °Dermatology Medication Tips: °Please keep the boxes that topical medications come in in order to help keep track of the instructions about where and how to use these. Pharmacies typically print the medication instructions only on the boxes and not directly on the medication tubes.  ° °If your medication is too expensive, please contact our office at [phone] option 4 or send us a message through MyChart.  ° °We are unable to tell what your co-pay for medications will be in advance as this is different depending on your insurance coverage.  However, we may be able to find a substitute medication at lower cost or fill out paperwork to get insurance to cover a needed medication.  ° °If a prior authorization is required to get your medication covered by your insurance company, please allow us 1-2 business days to complete this process. ° °Drug prices often vary depending on where the prescription is filled and some pharmacies may offer cheaper prices. ° °The website www.goodrx.com contains coupons for medications through different pharmacies. The prices here do not account for what the cost may be with help from insurance (it may be cheaper with your insurance), but the website can give you the price if you did not use any insurance.  °- You can print the associated coupon and take it with your prescription to the pharmacy.  °- You may also stop by our office during regular business hours and pick up a GoodRx coupon card.  °- If you need your prescription sent electronically to a different pharmacy, notify our office through [facility] MyChart or by phone at [phone] option 4. ° ° ° ° °Si Usted Necesita Algo Después de Su Visita ° °También puede enviarnos un mensaje a través de MyChart. Por lo general respondemos a los mensajes de MyChart en el transcurso de 1 a 2 días hábiles. ° °Para renovar recetas, por favor pida a su farmacia que se ponga en contacto con nuestra oficina. Nuestro número de fax es el [phone]. ° °Si tiene   un asunto urgente cuando la clnica est cerrada y que no puede esperar hasta el siguiente da hbil, puede llamar/localizar a su doctor(a) al nmero que aparece a continuacin.   Por favor, tenga en cuenta que aunque hacemos todo lo posible para estar disponibles para asuntos urgentes fuera del horario de [place], no estamos disponibles las 24 horas del da, los 7 das de la [place].   Si tiene un problema urgente y no puede comunicarse con nosotros, puede optar por buscar atencin mdica  en el consultorio de su  doctor(a), en una clnica privada, en un centro de atencin urgente o en una sala de emergencias.  Si tiene Engineering geologist, por favor llame inmediatamente al 911 o vaya a la sala de emergencias.  Nmeros de bper  - Dr. Nehemiah Massed: [phone]  - Dra. Moye: [phone]  - Dra. Nicole Kindred: [phone]  En caso de inclemencias del [place], por favor llame a Johnsie Kindred principal al [phone] para una actualizacin sobre el [place] de cualquier retraso o cierre.  Consejos para la medicacin en dermatologa: Por favor, guarde las cajas en las que vienen los medicamentos de uso tpico para ayudarle a seguir las instrucciones sobre dnde y cmo usarlos. Las farmacias generalmente imprimen las instrucciones del medicamento slo en las cajas y no directamente en los tubos del [place].   Si su medicamento es muy caro, por favor, pngase en contacto con Zigmund Daniel llamando al [phone] y presione la opcin 4 o envenos un mensaje a travs de Pharmacist, community.   No podemos decirle cul ser su copago por los medicamentos por adelantado ya que esto es diferente dependiendo de la cobertura de su seguro. Sin embargo, es posible que podamos encontrar un medicamento sustituto a Electrical engineer un formulario para que el seguro cubra el medicamento que se considera necesario.   Si se requiere una autorizacin previa para que su compaa de seguros [place] su medicamento, por favor permtanos de 1 a 2 das hbiles para completar este proceso.  Los precios de los medicamentos varan con frecuencia dependiendo del Environmental consultant de dnde se surte la receta y alguna farmacias pueden ofrecer precios ms baratos.  El sitio web www.goodrx.com tiene cupones para medicamentos de Airline pilot. Los precios aqu no tienen en cuenta lo que podra costar con la ayuda del seguro (puede ser ms barato con su seguro), pero el sitio web puede darle el precio si no utiliz Research scientist (physical sciences).  - Puede imprimir el cupn  correspondiente y llevarlo con su receta a la farmacia.  - Tambin puede pasar por nuestra oficina durante el horario de atencin regular y Charity fundraiser una tarjeta de cupones de GoodRx.  - Si necesita que su receta se enve electrnicamente a una farmacia diferente, informe a nuestra oficina a travs de MyChart de [place] o por telfono llamando al [phone] y presione la opcin 4.   PFB shaving system, Pseudofolliculitis barbae shaving system

## 2021-02-01 NOTE — Progress Notes (Signed)
° °  Follow-Up Visit   Subjective  Mackenzie Bonilla is a 55 y.o. female who presents for the following: check spots (Face,neck >55m, pt picks at one on L jaw and is painful) and hypertrichosis (Chin/neck, pt has hx of PCOS and is on spironolactone, she said the hair growth is increased, pt plucks and was using a Pseudofilliculitis barbae system). The patient has spots, moles and lesions to be evaluated, some may be new or changing and the patient has concerns that these could be cancer.  The following portions of the chart were reviewed this encounter and updated as appropriate:   Tobacco   Allergies   Meds   Problems   Med Hx   Surg Hx   Fam Hx      Review of Systems:  No other skin or systemic complaints except as noted in HPI or Assessment and Plan.  Objective  Well appearing patient in no apparent distress; mood and affect are within normal limits.  A focused examination was performed including face, neck. Relevant physical exam findings are noted in the Assessment and Plan.  L jaw x 1 Stuck on waxy hyperkeratotic paps with erythema      face, neck, chest Follicular based paps face, neck, chest  chin, neck hypertrichosis   Assessment & Plan  Inflamed seborrheic keratosis with Lichen simplex chronicus L jaw x 1  Destruction of lesion - L jaw x 1 Complexity: simple   Destruction method: cryotherapy   Informed consent: discussed and consent obtained   Timeout:  patient name, date of birth, surgical site, and procedure verified Lesion destroyed using liquid nitrogen: Yes   Region frozen until ice ball extended beyond lesion: Yes   Outcome: patient tolerated procedure well with no complications   Post-procedure details: wound care instructions given    Folliculitis And Pseudofolliculits barbae face, neck, chest Chronic and persistent Start Clindamycin lotion qd to aa bumps on face, neck and chest Recommend PFB shaving system  clindamycin (CLEOCIN-T) 1 % lotion - face,  neck, chest Apply topically daily. Qd to bumps on face and chest  Hypertrichosis chin, neck Chronic and persistent Start Vaniqua cream bid to aa face and neck Laser Hair removal may not be a good option since hair is very light color.  Eflornithine HCl (VANIQA) 13.9 % cream - chin, neck Apply 1 application topically 2 (two) times daily with a meal. Bid to aa hair growth face and neck  Return in about 2 months (around 04/04/2021) for ISk f/u.  I, [name], RMA, am acting as scribe for Sarina Ser, MD . Documentation: I have reviewed the above documentation for accuracy and completeness, and I agree with the above.  Sarina Ser, MD

## 2021-02-10 ENCOUNTER — Telehealth (INDEPENDENT_AMBULATORY_CARE_PROVIDER_SITE_OTHER): Payer: Medicare PPO | Admitting: Family

## 2021-02-10 ENCOUNTER — Encounter: Payer: Self-pay | Admitting: Family

## 2021-02-10 ENCOUNTER — Other Ambulatory Visit: Payer: Self-pay

## 2021-02-10 VITALS — Ht 64.0 in | Wt 329.0 lb

## 2021-02-10 DIAGNOSIS — J069 Acute upper respiratory infection, unspecified: Secondary | ICD-10-CM

## 2021-02-10 DIAGNOSIS — U071 COVID-19: Secondary | ICD-10-CM | POA: Diagnosis not present

## 2021-02-10 MED ORDER — AMOXICILLIN-POT CLAVULANATE 875-125 MG PO TABS: 1.0000 | ORAL_TABLET | Freq: Two times a day (BID) | ORAL | 0 refills | Status: AC

## 2021-02-10 NOTE — Patient Instructions (Signed)
An antibiotic was prescribed to your preferred pharmacy today, please pick up and take as directed.  Your COVID19 PCR test is POSITIVE.  Let us know right away if any worsening shortness of breath or persistent productive cough or fever.   Follow these current CDC guidelines for self-isolation:  - Stay home for 5 days, starting the day after your symptoms (The first day is really day 0). - If you have no symptoms or your symptoms are resolving after 5 days, you can leave your house. - Continue to wear a mask around others for 5 additional days. **If you have a fever, continue to stay home until your fever resolves for 24 hours without fever-reducing medications.**

## 2021-02-10 NOTE — Progress Notes (Signed)
MyChart Video Visit    Virtual Visit via Video Note   This visit type was conducted due to national recommendations for restrictions regarding the COVID-19 Pandemic (e.g. social distancing) in an effort to limit this patient's exposure and mitigate transmission in our community. This patient is at least at moderate risk for complications without adequate follow up. This format is felt to be most appropriate for this patient at this time. Physical exam was limited by quality of the video and audio technology used for the visit. CMA was able to get the patient set up on a video visit.  Patient location: Home. Patient and provider in visit Provider location: Office  I discussed the limitations of evaluation and management by telemedicine and the availability of in person appointments. The patient expressed understanding and agreed to proceed.  Visit Date: 02/10/2021  Today's healthcare provider: Eugenia Pancoast, FNP     Subjective:    Patient ID: Mackenzie Bonilla, female    DOB: May 16, 1965, 55 y.o.   MRN: [id number]  Chief Complaint  Patient presents with   Covid Positive    HPI  55 y/o female here with concerns.  Tested positive for covid 02/09/21.  Sx started about six days ago, started initially with headache and lethargy and has had more symptoms since. Today with c/o nasal congestion, headache, bil ear pain, and chest congestion as well as difficulty sleeping at night because hard to breathe with nasal congestion. Bad coughing fits that are mainly dry and nonproductive. No wheezing and or sob.   Only needed to use albuterol a few days ago, not since.  No fever and or chills.  T today was 99.1 F.  Taking some otc medications, taking mucinex as well as tylenol for the aches.   Past Medical History:  Diagnosis Date   Allergy    allergic rhinitis   Anemia    Anxiety    Arthritis    osteoarthritis   Asthma    Claustrophobia    does not like oxygen mask covering face    Depression    Diabetes mellitus without complication ([place])    Fall 2016   Fatty liver 07/2008   abd. ultrasound - fatty liver ; slt dilated cbd (no stones ) 06/10// abd.  ultrasound normal on 04/2006   Fibromyalgia    GERD (gastroesophageal reflux disease)    EGD negative 06/2001// EGD erythematous mucosa, polyp 08/2008   High uric acid in 24 hour urine specimen    Hyperhidrosis    Hyperlipidemia    Kidney stones    Lymphedema    Obesity    PCOS (polycystic ovarian syndrome)    Shortness of breath dyspnea    Tachycardia    TMJ (dislocation of temporomandibular joint)    UTI (lower urinary tract infection)     Past Surgical History:  Procedure Laterality Date   BREAST BIOPSY Right 2016   neg   BREAST LUMPECTOMY WITH RADIOACTIVE SEED LOCALIZATION Right 08/02/2014   Procedure: BREAST LUMPECTOMY WITH RADIOACTIVE SEED LOCALIZATION;  Surgeon: Rolm Bookbinder, MD;  Location: [facility];  Service: General;  Laterality: Right;   COLONOSCOPY  08/12/2012   Completed by Dr. Phylis Bougie, normal exam.    ENDOMETRIAL BIOPSY  01/2004   ESOPHAGOGASTRODUODENOSCOPY     ESOPHAGOGASTRODUODENOSCOPY (EGD) WITH PROPOFOL N/A 08/18/2019   Procedure: ESOPHAGOGASTRODUODENOSCOPY (EGD) WITH PROPOFOL;  Surgeon: Lucilla Lame, MD;  Location: [facility];  Service: Endoscopy;  Laterality: N/A;   FOOT SURGERY     SEPTOPLASTY  two times   TONSILLECTOMY     UTERINE FIBROID SURGERY  06/2005   ablation   uterine tumor  09/2001   VAGUS NERVE STIMULATOR INSERTION      Family History  Problem Relation Age of Onset   Hypertension Father    Cancer Father        pancreatic cancer   Hypertension Sister    Heart disease Maternal Grandmother 45       MI   Diabetes Maternal Grandmother    Heart disease Paternal Grandmother 38       MI   Diabetes Paternal Grandmother    Breast cancer Neg Hx     Social History   Socioeconomic History   Marital status: Single    Spouse name: Not on file   Number of  children: Not on file   Years of education: Not on file   Highest education level: Not on file  Occupational History   Not on file  Tobacco Use   Smoking status: Never   Smokeless tobacco: Never  Vaping Use   Vaping Use: Never used  Substance and Sexual Activity   Alcohol use: No    Alcohol/week: 0.0 Bonilla drinks   Drug use: No   Sexual activity: Not Currently    Birth control/protection: None, Post-menopausal  Other Topics Concern   Not on file  Social History Narrative   Not on file   Social Determinants of Health   Financial Resource Strain: Not on file  Food Insecurity: Not on file  Transportation Needs: Not on file  Physical Activity: Not on file  Stress: Not on file  Social Connections: Not on file  Intimate Partner Violence: Not on file    Outpatient Medications Prior to Visit  Medication Sig Dispense Refill   acetaminophen (TYLENOL) 500 MG tablet Take 500 mg by mouth every 6 (six) hours as needed.      albuterol (VENTOLIN HFA) 108 (90 Base) MCG/ACT inhaler Inhale 2 puffs into the lungs every 6 (six) hours as needed for wheezing. 18 g 3   ALPRAZolam (XANAX) 1 MG tablet Take 2 mg by mouth 3 (three) times daily as needed.     Blood Glucose Monitoring Suppl (FIFTY50 GLUCOSE METER 2.0) w/Device KIT Use as directed     buPROPion (WELLBUTRIN XL) 150 MG 24 hr tablet Take 150 mg by mouth daily.     buPROPion (ZYBAN) 150 MG 12 hr tablet Take 150 mg by mouth 2 (two) times daily.     Cholecalciferol (VITAMIN D3 PO) Take 2,000 Units by mouth daily.     clindamycin (CLEOCIN-T) 1 % lotion Apply topically daily. Qd to bumps on face and chest 60 mL 6   cyanocobalamin (,VITAMIN B-12,) 1000 MCG/ML injection Inject 1 mL (1,000 mcg total) into the muscle every 6 (six) weeks. 1 mL 9   cyclobenzaprine (FLEXERIL) 5 MG tablet Take 1 tablet (5 mg total) by mouth 3 (three) times daily as needed for muscle spasms. 90 tablet 2   desvenlafaxine (PRISTIQ) 100 MG 24 hr tablet       dimenhyDRINATE (DRAMAMINE) 50 MG tablet Take 50 mg by mouth every 8 (eight) hours as needed.     Eflornithine HCl (VANIQA) 13.9 % cream Apply 1 application topically 2 (two) times daily with a meal. Bid to aa hair growth face and neck 45 g 6   fluticasone (FLONASE) 50 MCG/ACT nasal spray Place into both nostrils daily.     gabapentin (NEURONTIN) 100 MG capsule TAKE 2  CAPSULES BY MOUTH EVERY MORNING AND 3 CAPSULES EVERY EVENING 150 capsule 0   gabapentin (NEURONTIN) 300 MG capsule TAKE 1 CAPSULE AT BEDTIME 90 capsule 3   ibuprofen (ADVIL) 600 MG tablet Take 1 tablet (600 mg total) by mouth every 8 (eight) hours as needed. 30 tablet 0   Insulin Degludec (TRESIBA [place]) Inject into the skin. 1 injection nightly 8-10 pm     losartan (COZAAR) 25 MG tablet      nystatin cream (MYCOSTATIN) Apply 1 application topically 3 (three) times daily as needed (for yeast infection areas). To affected areas 30 g 3   omeprazole (PRILOSEC) 20 MG capsule Take 1 capsule (20 mg total) by mouth 2 (two) times daily. 180 capsule 3   oxybutynin (DITROPAN XL) 15 MG 24 hr tablet TAKE 1 TABLET BY MOUTH DAILY 90 tablet 1   Polyethyl Glycol-Propyl Glycol (SYSTANE OP) Apply 1 drop to eye daily as needed.     pravastatin (PRAVACHOL) 20 MG tablet Take 1 tablet (20 mg total) by mouth at bedtime. 90 tablet 3   Simethicone (GAS-X PO) Take by mouth as needed.     spironolactone (ALDACTONE) 100 MG tablet Take 1 tablet (100 mg total) by mouth daily. 90 tablet 3   tirzepatide (MOUNJARO) 7.5 MG/0.5ML Pen Inject 7.5 mg into the skin once a week.     traMADol (ULTRAM) 50 MG tablet TAKE ONE TABLET TWICE A DAY IF NEEDED FOR SEVERE PAIN 30 tablet 0   traZODone (DESYREL) 150 MG tablet Take 300 mg by mouth at bedtime.      zinc oxide (BALMEX) 11.3 % CREA cream Apply 1 application topically 2 (two) times daily.     No facility-administered medications prior to visit.    Allergies  Allergen Reactions   Cephalexin    Codeine     rash    Duloxetine    Levaquin [Levofloxacin] Nausea Only   Lisinopril Cough   Metformin And Related Diarrhea    GI side effects    Quetiapine Other (See Comments)   Sertraline Hcl     REACTION: vivid dreams   Triazolam     REACTION: ? reaction   Zaleplon    Zocor [Simvastatin - High Dose]     achey   Zolpidem Tartrate     hallucinations   Hydrocodone Itching and Rash    REACTION: redness rash itching   Norelgestromin-Eth Estradiol     Patch didn't stick to skin    Review of Systems  Constitutional:  Positive for fever and malaise/fatigue. Negative for chills.  HENT:  Positive for congestion and ear pain (bil ear pain). Negative for sore throat.   Respiratory:  Positive for cough. Negative for sputum production, shortness of breath and wheezing.   Cardiovascular:  Negative for chest pain.  Musculoskeletal:  Positive for myalgias.  All other systems reviewed and are negative.     Objective:    Physical Exam Constitutional:      General: She is not in acute distress.    Appearance: Normal appearance. She is obese. She is not ill-appearing, toxic-appearing or diaphoretic.  HENT:     Head: Normocephalic.  Pulmonary:     Effort: Pulmonary effort is normal.  Neurological:     General: No focal deficit present.     Mental Status: She is alert and oriented to person, place, and time.  Psychiatric:        Mood and Affect: Mood normal.        Behavior: Behavior  normal.        Thought Content: Thought content normal.        Judgment: Judgment normal.    Ht 5' 4" (1.626 m)    Wt (!) 329 lb (149.2 kg)    BMI 56.47 kg/m  Wt Readings from Last 3 Encounters:  02/10/21 (!) 329 lb (149.2 kg)  08/10/20 (!) 329 lb (149.2 kg)  08/04/20 (!) 329 lb (149.2 kg)       Assessment & Plan:   Problem List Items Addressed This Visit       Other   COVID-19    Fortunately over the timeframe for an antiviral at this point however she is improved incredibly high risk given multiple core  morbidities and morbid obesity.  I am concerned that she is at high risk for development of pneumonia as well.  I have encouraged patient to walk at least every hour around the house and to make sure that she just not to stay in the sofa and or on the bed.  I did send a prescription for Augmentin 875/125 mg p.o. twice daily for 10 days patient to start and take as directed.  Advised of CDC guidelines for self isolation/ ending isolation.  Advised of safe practice guidelines. Symptom Tier reviewed.  Encouraged to monitor for any worsening symptoms; watch for increased shortness of breath, weakness, and signs of dehydration. Advised when to seek emergency care.  Instructed to rest and hydrate well.  Advised to leave the house during recommended isolation period, only if it is necessary to seek medical care        Other Visit Diagnoses     Upper respiratory infection, acute    -  Primary   Relevant Medications   amoxicillin-clavulanate (AUGMENTIN) 875-125 MG tablet       I am having Mackenzie Bonilla. [employer]" start on amoxicillin-clavulanate. I am also having her maintain her acetaminophen, ALPRAZolam, traZODone, Simethicone (GAS-X PO), losartan, dimenhyDRINATE, desvenlafaxine, Polyethyl Glycol-Propyl Glycol (SYSTANE OP), nystatin cream, Fifty50 Glucose Meter 2.0, ibuprofen, zinc oxide, fluticasone, Insulin Degludec (TRESIBA [place]), buPROPion, albuterol, omeprazole, pravastatin, spironolactone, cyanocobalamin, Cholecalciferol (VITAMIN D3 PO), buPROPion, cyclobenzaprine, Mounjaro, traMADol, gabapentin, oxybutynin, gabapentin, Vaniqa, and clindamycin.  Meds ordered this encounter  Medications   amoxicillin-clavulanate (AUGMENTIN) 875-125 MG tablet    Sig: Take 1 tablet by mouth 2 (two) times daily for 7 days.    Dispense:  14 tablet    Refill:  0    Pt tolerates this well    Order Specific Question:   Supervising Provider    Answer:   BEDSOLE, AMY E [[id number]]    I discussed the assessment and  treatment plan with the patient. The patient was provided an opportunity to ask questions and all were answered. The patient agreed with the plan and demonstrated an understanding of the instructions.   The patient was advised to call back or seek an in-person evaluation if the symptoms worsen or if the condition fails to improve as anticipated.  I provided 26 minutes of face-to-face time during this encounter.   Eugenia Pancoast, [facility] [place] [phone] (phone) [phone] (fax)  [place]

## 2021-02-10 NOTE — Assessment & Plan Note (Signed)
Fortunately over the timeframe for an antiviral at this point however she is improved incredibly high risk given multiple core morbidities and morbid obesity.  I am concerned that she is at high risk for development of pneumonia as well.  I have encouraged patient to walk at least every hour around the house and to make sure that she just not to stay in the sofa and or on the bed.  I did send a prescription for Augmentin 875/125 mg p.o. twice daily for 10 days patient to start and take as directed.  Advised of CDC guidelines for self isolation/ ending isolation.  Advised of safe practice guidelines. Symptom Tier reviewed.  Encouraged to monitor for any worsening symptoms; watch for increased shortness of breath, weakness, and signs of dehydration. Advised when to seek emergency care.  Instructed to rest and hydrate well.  Advised to leave the house during recommended isolation period, only if it is necessary to seek medical care

## 2021-02-12 ENCOUNTER — Encounter: Payer: Self-pay | Admitting: Dermatology

## 2021-02-14 ENCOUNTER — Telehealth: Payer: Self-pay | Admitting: Family Medicine

## 2021-02-14 NOTE — Telephone Encounter (Signed)
Called and spoke to pt, per message- call and schedule 1wk follow up. Pt stated that she didn't want to schedule an appt

## 2021-03-02 ENCOUNTER — Encounter: Payer: Self-pay | Admitting: Family Medicine

## 2021-03-03 ENCOUNTER — Other Ambulatory Visit: Payer: Self-pay

## 2021-03-03 ENCOUNTER — Encounter: Payer: Self-pay | Admitting: Podiatry

## 2021-03-03 ENCOUNTER — Ambulatory Visit: Payer: Medicare PPO | Admitting: Podiatry

## 2021-03-03 DIAGNOSIS — B351 Tinea unguium: Secondary | ICD-10-CM

## 2021-03-03 DIAGNOSIS — M79675 Pain in left toe(s): Secondary | ICD-10-CM

## 2021-03-03 DIAGNOSIS — M79674 Pain in right toe(s): Secondary | ICD-10-CM | POA: Diagnosis not present

## 2021-03-03 NOTE — Progress Notes (Signed)
° °  SUBJECTIVE Patient presents today for follow-up evaluation regarding bilateral foot pain and swelling.  Patient states that she is doing well.  She states that she does have a hard time getting to her feet to properly care for her feet.  Her nails are long and tender.  She would like to have them evaluated and trim today.  Past Medical History:  Diagnosis Date   Allergy    allergic rhinitis   Anemia    Anxiety    Arthritis    osteoarthritis   Asthma    Claustrophobia    does not like oxygen mask covering face   Depression    Diabetes mellitus without complication ([place])    Fall 2016   Fatty liver 07/2008   abd. ultrasound - fatty liver ; slt dilated cbd (no stones ) 06/10// abd.  ultrasound normal on 04/2006   Fibromyalgia    GERD (gastroesophageal reflux disease)    EGD negative 06/2001// EGD erythematous mucosa, polyp 08/2008   High uric acid in 24 hour urine specimen    Hyperhidrosis    Hyperlipidemia    Kidney stones    Lymphedema    Obesity    PCOS (polycystic ovarian syndrome)    Shortness of breath dyspnea    Tachycardia    TMJ (dislocation of temporomandibular joint)    UTI (lower urinary tract infection)     OBJECTIVE General Patient is awake, alert, and oriented x 3 and in no acute distress. Derm Skin is dry and supple bilateral. Negative open lesions or macerations. Remaining integument unremarkable. Nails are tender, long, thickened and dystrophic with subungual debris, consistent with onychomycosis, 1-5 bilateral. No signs of infection noted.  Hyperkeratotic preulcerative callus tissue noted to the bilateral feet and toes Vasc  DP and PT pedal pulses palpable bilaterally. Temperature gradient within normal limits.  Edema noted bilateral lower extremities Neuro Epicritic and protective threshold sensation grossly intact bilaterally.  Musculoskeletal Exam No symptomatic pedal deformities noted bilateral. Muscular strength within normal limits. ASSESSMENT 1.   Chronic bilateral lower extremity foot pain 2.  Bilateral lower extremity edema 3.  Pain due to onychomycosis of toenail bilateral  PLAN OF CARE 1. Patient evaluated today.  2. Instructed to maintain good pedal hygiene and foot care.  3.  Continue wearing [place] shoes from [employer] 4.  Continue to recommend applying Revitaderm urea 40% lotion that was provided last visit 5.  Mechanical debridement of nails 1-5 was performed using a nail nipper without incident or bleeding  6.  Return to clinic 3 months   Edrick Kins, DPM [facility]  Dr. Edrick Kins, DPM    [address], [place] [address]                Office [phone]  Fax [phone]

## 2021-04-04 ENCOUNTER — Ambulatory Visit: Payer: Medicare PPO | Admitting: Dermatology

## 2021-04-04 ENCOUNTER — Other Ambulatory Visit: Payer: Self-pay | Admitting: Family Medicine

## 2021-04-10 ENCOUNTER — Other Ambulatory Visit: Payer: Self-pay | Admitting: Family Medicine

## 2021-04-10 NOTE — Telephone Encounter (Signed)
Last OV with PCP was on 06/15/20, last filled on 03/17/19 #30 g with 3 refills.  See message on refill request saying pt has a rash

## 2021-04-12 ENCOUNTER — Other Ambulatory Visit: Payer: Self-pay

## 2021-04-12 ENCOUNTER — Ambulatory Visit: Payer: Medicare PPO | Admitting: Family Medicine

## 2021-04-12 ENCOUNTER — Encounter: Payer: Self-pay | Admitting: Family Medicine

## 2021-04-12 VITALS — BP 124/82 | HR 104 | Temp 98.2°F | Ht 64.0 in | Wt 293.0 lb

## 2021-04-12 DIAGNOSIS — E119 Type 2 diabetes mellitus without complications: Secondary | ICD-10-CM

## 2021-04-12 DIAGNOSIS — B372 Candidiasis of skin and nail: Secondary | ICD-10-CM | POA: Diagnosis not present

## 2021-04-12 DIAGNOSIS — K5900 Constipation, unspecified: Secondary | ICD-10-CM | POA: Diagnosis not present

## 2021-04-12 MED ORDER — FLUCONAZOLE 150 MG PO TABS: 150.0000 mg | ORAL_TABLET | Freq: Once | ORAL | 0 refills | Status: AC

## 2021-04-12 MED ORDER — KETOCONAZOLE 2 % EX CREA: 1.0000 "application " | TOPICAL_CREAM | Freq: Every day | CUTANEOUS | 2 refills | Status: DC

## 2021-04-12 NOTE — Progress Notes (Signed)
Subjective:    Patient ID: Mackenzie Bonilla, female    DOB: 1965-09-08, 56 y.o.   MRN: [id number]  This visit occurred during the SARS-CoV-2 public health emergency.  Safety protocols were in place, including screening questions prior to the visit, additional usage of staff PPE, and extensive cleaning of exam room while observing appropriate contact time as indicated for disinfecting solutions.   HPI Pt presents for f/u of rash and  constipation   Wt Readings from Last 3 Encounters:  04/12/21 293 lb (132.9 kg)  02/10/21 (!) 329 lb (149.2 kg)  08/10/20 (!) 329 lb (149.2 kg)   50.29 kg/m  Has lost down to 289 on her scale She is thrilled about it  Using a meal prep service local ([employer])  Now eating vegetables and a more balanced diet  This has helped a lot   Now eating broccoli and squash and peppers   She is going to the gym   Rash - started last week  Itchy and bothersome  Unsure if fungal  L side under breast and spreading to her back   Used the ketoconazole cream because it was out of date Used some newer nystatin-used 1/2 the tube   Constipation - began when she changed her diet (worse since then) and with a bad depression episode  Can go up to a week w/o bm  Hurts a lot to have a BM /very uncomfortable  No bleeding  Stool is firm like clay   Not drinking enough water (she was but then less when she was depressed)  Has tried miralax and dulcolax    DM Taking mounjaro tresiba Last a1c was 6.7  Patient Active Problem List   Diagnosis Date Noted   COVID-19 02/10/2021   Breast pain, right 12/20/2020   Screening mammogram for breast cancer 12/13/2020   Fatigue 06/15/2020   Microscopic hematuria 04/19/2020   Lumbar facet arthropathy 12/29/2019   Chronic bilateral thoracic back pain 12/08/2019   Lumbar degenerative disc disease 12/08/2019   Fibromyalgia 12/08/2019   Chronic pain syndrome 12/08/2019   Lumbar radicular pain (left S1) 12/08/2019    Encounter for medication review and counseling 10/06/2019   Mid back pain 02/11/2019   Multiple joint pain 11/12/2018   Eating disorder 05/23/2017   Gallstones 04/19/2017   Diabetic polyneuropathy associated with type 2 diabetes mellitus ([place]) 04/07/2017   Diabetic angiopathy ([place]) 04/07/2017   Vasomotor symptoms due to menopause 04/07/2017   Mobility impaired 11/12/2016   Urinary incontinence 12/02/2015   Candidal intertrigo 04/06/2015   Cystocele 04/06/2015   Constipation 04/06/2015   Myofascial pain syndrome of lumbar spine 12/28/2014   Hypertriglyceridemia 10/14/2014   Breast mass in female 07/28/2014   Right knee pain 07/23/2013   Fall 07/23/2013   Medication management 11/20/2012   Uric acid kidney stone 11/20/2011   HYPERHIDROSIS 11/07/2009   Menopausal symptoms 06/17/2009   Type 2 diabetes mellitus without complication, without long-term current use of insulin ([place]) 08/23/2008   Vitamin D deficiency 05/07/2008   Vitamin B 12 deficiency 06/04/2007   CHRONIC FATIGUE SYNDROME 11/15/2006   DIVERTICULITIS, HX OF 11/15/2006   PCOS (polycystic ovarian syndrome) 10/16/2006   Hyperlipidemia associated with type 2 diabetes mellitus ([place]) 10/16/2006   Morbid obesity ([place]) 10/16/2006   Generalized anxiety disorder 10/16/2006   PANIC DISORDER 10/16/2006   BULIMIA 10/16/2006   Chronic depression 10/16/2006   CARPAL TUNNEL SYNDROME 10/16/2006   LYMPHEDEMA 10/16/2006   Allergic rhinitis 10/16/2006   Mild intermittent asthma  10/16/2006   TMJ SYNDROME 10/16/2006   GERD 10/16/2006   IRRITABLE BOWEL SYNDROME 10/16/2006   Osteoarthritis 10/16/2006   COUGH, CHRONIC 10/16/2006   Past Medical History:  Diagnosis Date   Allergy    allergic rhinitis   Anemia    Anxiety    Arthritis    osteoarthritis   Asthma    Claustrophobia    does not like oxygen mask covering face   Depression    Diabetes mellitus without complication ([place])    Fall 2016   Fatty liver 07/2008   abd.  ultrasound - fatty liver ; slt dilated cbd (no stones ) 06/10// abd.  ultrasound normal on 04/2006   Fibromyalgia    GERD (gastroesophageal reflux disease)    EGD negative 06/2001// EGD erythematous mucosa, polyp 08/2008   High uric acid in 24 hour urine specimen    Hyperhidrosis    Hyperlipidemia    Kidney stones    Lymphedema    Obesity    PCOS (polycystic ovarian syndrome)    Shortness of breath dyspnea    Tachycardia    TMJ (dislocation of temporomandibular joint)    UTI (lower urinary tract infection)    Past Surgical History:  Procedure Laterality Date   BREAST BIOPSY Right 2016   neg   BREAST LUMPECTOMY WITH RADIOACTIVE SEED LOCALIZATION Right 08/02/2014   Procedure: BREAST LUMPECTOMY WITH RADIOACTIVE SEED LOCALIZATION;  Surgeon: Rolm Bookbinder, MD;  Location: [facility];  Service: General;  Laterality: Right;   COLONOSCOPY  08/12/2012   Completed by Dr. Phylis Bougie, normal exam.    ENDOMETRIAL BIOPSY  01/2004   ESOPHAGOGASTRODUODENOSCOPY     ESOPHAGOGASTRODUODENOSCOPY (EGD) WITH PROPOFOL N/A 08/18/2019   Procedure: ESOPHAGOGASTRODUODENOSCOPY (EGD) WITH PROPOFOL;  Surgeon: Lucilla Lame, MD;  Location: [facility] ENDOSCOPY;  Service: Endoscopy;  Laterality: N/A;   FOOT SURGERY     SEPTOPLASTY     two times   TONSILLECTOMY     UTERINE FIBROID SURGERY  06/2005   ablation   uterine tumor  09/2001   VAGUS NERVE STIMULATOR INSERTION     Social History   Tobacco Use   Smoking status: Never   Smokeless tobacco: Never  Vaping Use   Vaping Use: Never used  Substance Use Topics   Alcohol use: No    Alcohol/week: 0.0 standard drinks   Drug use: No   Family History  Problem Relation Age of Onset   Hypertension Father    Cancer Father        pancreatic cancer   Hypertension Sister    Heart disease Maternal Grandmother 54       MI   Diabetes Maternal Grandmother    Heart disease Paternal Grandmother 21       MI   Diabetes Paternal Grandmother    Breast cancer Neg Hx     Allergies  Allergen Reactions   Cephalexin    Codeine     rash   Duloxetine    Levaquin [Levofloxacin] Nausea Only   Lisinopril Cough   Metformin And Related Diarrhea    GI side effects    Quetiapine Other (See Comments)   Sertraline Hcl     REACTION: vivid dreams   Triazolam     REACTION: ? reaction   Zaleplon    Zocor [Simvastatin - High Dose]     achey   Zolpidem Tartrate     hallucinations   Hydrocodone Itching and Rash    REACTION: redness rash itching   Norelgestromin-Eth Estradiol  Patch didn't stick to skin   Current Outpatient Medications on File Prior to Visit  Medication Sig Dispense Refill   acetaminophen (TYLENOL) 500 MG tablet Take 500 mg by mouth every 6 (six) hours as needed.      albuterol (VENTOLIN HFA) 108 (90 Base) MCG/ACT inhaler Inhale 2 puffs into the lungs every 6 (six) hours as needed for wheezing. 18 g 3   ALPRAZolam (XANAX) 1 MG tablet Take 2 mg by mouth 3 (three) times daily as needed.     Blood Glucose Monitoring Suppl (FIFTY50 GLUCOSE METER 2.0) w/Device KIT Use as directed     buPROPion (WELLBUTRIN XL) 150 MG 24 hr tablet Take 150 mg by mouth daily.     buPROPion (ZYBAN) 150 MG 12 hr tablet Take 150 mg by mouth 2 (two) times daily.     Cholecalciferol (VITAMIN D3 PO) Take 2,000 Units by mouth daily.     cyanocobalamin (,VITAMIN B-12,) 1000 MCG/ML injection Inject 1 mL (1,000 mcg total) into the muscle every 6 (six) weeks. 1 mL 9   cyclobenzaprine (FLEXERIL) 5 MG tablet Take 1 tablet (5 mg total) by mouth 3 (three) times daily as needed for muscle spasms. 90 tablet 2   desvenlafaxine (PRISTIQ) 100 MG 24 hr tablet      dimenhyDRINATE (DRAMAMINE) 50 MG tablet Take 50 mg by mouth every 8 (eight) hours as needed.     Eflornithine HCl (VANIQA) 13.9 % cream Apply 1 application topically 2 (two) times daily with a meal. Bid to aa hair growth face and neck 45 g 6   fluticasone (FLONASE) 50 MCG/ACT nasal spray Place into both nostrils daily.      gabapentin (NEURONTIN) 100 MG capsule TAKE 2 CAPSULES BY MOUTH EVERY MORNING AND 3 CAPSULES EVERY EVENING 150 capsule 0   gabapentin (NEURONTIN) 300 MG capsule TAKE 1 CAPSULE AT BEDTIME 90 capsule 3   ibuprofen (ADVIL) 600 MG tablet Take 1 tablet (600 mg total) by mouth every 8 (eight) hours as needed. 30 tablet 0   Insulin Degludec (TRESIBA [place]) Inject into the skin. 1 injection nightly 8-10 pm     losartan (COZAAR) 25 MG tablet      nystatin cream (MYCOSTATIN) APPLY TO AFFECTED AREAS 3 TIMES DAILY ASNEEDED 30 g 3   omeprazole (PRILOSEC) 20 MG capsule Take 1 capsule (20 mg total) by mouth 2 (two) times daily. 180 capsule 3   oxybutynin (DITROPAN XL) 15 MG 24 hr tablet TAKE 1 TABLET BY MOUTH DAILY 90 tablet 1   Polyethyl Glycol-Propyl Glycol (SYSTANE OP) Apply 1 drop to eye daily as needed.     pravastatin (PRAVACHOL) 20 MG tablet Take 1 tablet (20 mg total) by mouth at bedtime. 90 tablet 3   Simethicone (GAS-X PO) Take by mouth as needed.     spironolactone (ALDACTONE) 100 MG tablet TAKE 1 TABLET BY MOUTH DAILY. 90 tablet 0   tirzepatide (MOUNJARO) 7.5 MG/0.5ML Pen Inject 7.5 mg into the skin once a week.     traMADol (ULTRAM) 50 MG tablet TAKE ONE TABLET TWICE A DAY IF NEEDED FOR SEVERE PAIN 30 tablet 0   traZODone (DESYREL) 150 MG tablet Take 300 mg by mouth at bedtime.      zinc oxide (BALMEX) 11.3 % CREA cream Apply 1 application topically 2 (two) times daily.     clindamycin (CLEOCIN-T) 1 % lotion Apply topically daily. Qd to bumps on face and chest (Patient not taking: Reported on 04/12/2021) 60 mL 6   No  current facility-administered medications on file prior to visit.     Review of Systems  Constitutional:  Negative for activity change, appetite change, fatigue, fever and unexpected weight change.  HENT:  Negative for congestion, ear pain, rhinorrhea, sinus pressure and sore throat.   Eyes:  Negative for pain, redness and visual disturbance.  Respiratory:  Negative for cough, shortness  of breath and wheezing.   Cardiovascular:  Negative for chest pain and palpitations.  Gastrointestinal:  Positive for constipation. Negative for abdominal pain, blood in stool and diarrhea.  Endocrine: Negative for polydipsia and polyuria.  Genitourinary:  Negative for dysuria, frequency and urgency.  Musculoskeletal:  Negative for arthralgias, back pain and myalgias.  Skin:  Positive for rash. Negative for pallor.  Allergic/Immunologic: Negative for environmental allergies.  Neurological:  Negative for dizziness, syncope and headaches.  Hematological:  Negative for adenopathy. Does not bruise/bleed easily.  Psychiatric/Behavioral:  Positive for dysphoric mood. Negative for decreased concentration. The patient is nervous/anxious.       Objective:   Physical Exam Constitutional:      General: She is not in acute distress.    Appearance: Normal appearance. She is well-developed. She is obese. She is not ill-appearing or diaphoretic.     Comments: Weight loss noted   HENT:     Head: Normocephalic and atraumatic.  Eyes:     Conjunctiva/sclera: Conjunctivae normal.     Pupils: Pupils are equal, round, and reactive to light.  Neck:     Thyroid: No thyromegaly.     Vascular: No carotid bruit or JVD.  Cardiovascular:     Rate and Rhythm: Normal rate and regular rhythm.     Heart sounds: Normal heart sounds.    No gallop.  Pulmonary:     Effort: Pulmonary effort is normal. No respiratory distress.     Breath sounds: Normal breath sounds. No wheezing or rales.  Abdominal:     General: Bowel sounds are normal. There is no distension or abdominal bruit.     Palpations: Abdomen is soft. There is no mass.     Tenderness: There is no abdominal tenderness.  Musculoskeletal:     Cervical back: Normal range of motion and neck supple.     Right lower leg: No edema.     Left lower leg: No edema.  Lymphadenopathy:     Cervical: No cervical adenopathy.  Skin:    General: Skin is warm and dry.      Coloration: Skin is not pale.     Findings: Erythema and rash present.     Comments: Erythematous intertrigo rash with a few satellite lesions under L breast and on L side  No open areas or oozing or drainage   No excoriation   Neurological:     Mental Status: She is alert.     Coordination: Coordination normal.     Deep Tendon Reflexes: Reflexes are normal and symmetric. Reflexes normal.  Psychiatric:        Mood and Affect: Mood normal.     Comments: Mood is positive Less anxious  talkative          Assessment & Plan:   Problem List Items Addressed This Visit       Endocrine   Type 2 diabetes mellitus without complication, without long-term current use of insulin ([place])    Doing better under care of endocrinology  Wt loss noted with better diet and mounjaro also        Musculoskeletal and Integument  Candidal intertrigo - Primary    Under L breast and L side with itching  Re px ketoconazole  Diflucan 150 mg times one oral as well  inst to keep area clean and very dry   Update if not starting to improve in a week or if worsening   Blood sugars are well controlled which is reassuring       Relevant Medications   ketoconazole (NIZORAL) 2 % cream   fluconazole (DIFLUCAN) 150 MG tablet     Other   Constipation    Diet is better with more produce and fiber, suspect now needs more fluids Made goal of 84 oz per day (mostly water)  Also miralax 3 times daily -to titrate down to effect once bms become more regular  Reassuring exam  Handout given  ER precautions discussed        Morbid obesity ([place])    Commended wt loss so far Good meal plan and service  mounjaro is likely also helping

## 2021-04-12 NOTE — Assessment & Plan Note (Signed)
Commended wt loss so far ?Good meal plan and service  ?Mackenzie Bonilla is likely also helping  ?

## 2021-04-12 NOTE — Assessment & Plan Note (Signed)
Doing better under care of endocrinology  ?Wt loss noted with better diet and mounjaro also ?

## 2021-04-12 NOTE — Patient Instructions (Addendum)
Aim for 64 oz of fluid daily (mostly water)  ? ?Miralax - three times daily until you start getting more regular bowel movements -then you can cut back to the amount you find works for you  ? ? ?Try voltaren gel or salon pas instead of oral ibuprofen  ? ?Oral anti inflammatories are hard on kidneys  ? ?Use the ketaconazole cream on rash ?Keep it as dry as possible  ?Take the diflucan pill orally once also  ? ?Let us know if not starting to improve in a week  ? ? ? ? ?

## 2021-04-12 NOTE — Assessment & Plan Note (Signed)
Diet is better with more produce and fiber, suspect now needs more fluids ?Made goal of 84 oz per day (mostly water)  ?Also miralax 3 times daily -to titrate down to effect once bms become more regular  ?Reassuring exam  ?Handout given  ?ER precautions discussed  ? ?

## 2021-04-12 NOTE — Assessment & Plan Note (Signed)
Under L breast and L side with itching  ?Re px ketoconazole  ?Diflucan 150 mg times one oral as well  ?inst to keep area clean and very dry  ? ?Update if not starting to improve in a week or if worsening   ?Blood sugars are well controlled which is reassuring  ?

## 2021-05-02 DIAGNOSIS — H6123 Impacted cerumen, bilateral: Secondary | ICD-10-CM | POA: Diagnosis not present

## 2021-05-02 DIAGNOSIS — H902 Conductive hearing loss, unspecified: Secondary | ICD-10-CM | POA: Diagnosis not present

## 2021-05-08 ENCOUNTER — Other Ambulatory Visit: Payer: Self-pay | Admitting: Family Medicine

## 2021-05-13 ENCOUNTER — Other Ambulatory Visit: Payer: Self-pay | Admitting: Family Medicine

## 2021-05-25 ENCOUNTER — Other Ambulatory Visit: Payer: Self-pay | Admitting: Family Medicine

## 2021-05-25 NOTE — Telephone Encounter (Signed)
Pt has had multiple acute appt but no recent f/u or CPE, last lipid was done by [facility] on 09/07/20, no future appts., please advise  ?

## 2021-05-29 DIAGNOSIS — Z794 Long term (current) use of insulin: Secondary | ICD-10-CM | POA: Diagnosis not present

## 2021-05-29 DIAGNOSIS — N1831 Chronic kidney disease, stage 3a: Secondary | ICD-10-CM | POA: Diagnosis not present

## 2021-05-29 DIAGNOSIS — E1122 Type 2 diabetes mellitus with diabetic chronic kidney disease: Secondary | ICD-10-CM | POA: Diagnosis not present

## 2021-06-01 ENCOUNTER — Telehealth: Payer: Self-pay

## 2021-06-01 ENCOUNTER — Ambulatory Visit
Admission: RE | Admit: 2021-06-01 | Discharge: 2021-06-01 | Disposition: A | Discharge status: Home / Self Care | Payer: Medicare PPO | Source: Ambulatory Visit | Attending: Family Medicine | Admitting: Family Medicine

## 2021-06-01 VITALS — BP 112/75 | HR 74 | Temp 98.7°F | Resp 18

## 2021-06-01 DIAGNOSIS — L0231 Cutaneous abscess of buttock: Secondary | ICD-10-CM | POA: Diagnosis not present

## 2021-06-01 MED ORDER — DOXYCYCLINE HYCLATE 100 MG PO CAPS: 100.0000 mg | ORAL_CAPSULE | Freq: Two times a day (BID) | ORAL | 0 refills | Status: DC

## 2021-06-01 NOTE — ED Provider Notes (Signed)
Renaldo Fiddler    CSN: [id number] Arrival date & time: 06/01/21  1514      History   Chief Complaint Chief Complaint  Patient presents with   Laceration    lump in perineal area or buttock - Entered by patient    HPI Mackenzie Bonilla is a 56 y.o. female.   HPI Patient with a medical history significant for type 2 diabetes and  morbid obesity presents today for evaluation of wound which has been draining blood discharge x few days. She is uncertain of where the wound is located or draining from exactly however, endorses that she has soiled her clothing due to drainage. She is requesting suturing of the wound.   She was referred her to UC by her PCP for evaluation. Denies any prior history of recurrent abscess, fissures, or hemorrhoids. Past Medical History:  Diagnosis Date   Allergy    allergic rhinitis   Anemia    Anxiety    Arthritis    osteoarthritis   Asthma    Claustrophobia    does not like oxygen mask covering face   Depression    Diabetes mellitus without complication (HCC)    Fall 2016   Fatty liver 07/2008   abd. ultrasound - fatty liver ; slt dilated cbd (no stones ) 06/10// abd.  ultrasound normal on 04/2006   Fibromyalgia    GERD (gastroesophageal reflux disease)    EGD negative 06/2001// EGD erythematous mucosa, polyp 08/2008   High uric acid in 24 hour urine specimen    Hyperhidrosis    Hyperlipidemia    Kidney stones    Lymphedema    Obesity    PCOS (polycystic ovarian syndrome)    Shortness of breath dyspnea    Tachycardia    TMJ (dislocation of temporomandibular joint)    UTI (lower urinary tract infection)     Patient Active Problem List   Diagnosis Date Noted   Abscess of left buttock 06/02/2021   Wound with tunneling 06/02/2021   COVID-19 02/10/2021   Breast pain, right 12/20/2020   Screening mammogram for breast cancer 12/13/2020   Fatigue 06/15/2020   Microscopic hematuria 04/19/2020   Lumbar facet arthropathy 12/29/2019    Chronic bilateral thoracic back pain 12/08/2019   Lumbar degenerative disc disease 12/08/2019   Fibromyalgia 12/08/2019   Chronic pain syndrome 12/08/2019   Lumbar radicular pain (left S1) 12/08/2019   Encounter for medication review and counseling 10/06/2019   Mid back pain 02/11/2019   Multiple joint pain 11/12/2018   Eating disorder 05/23/2017   Gallstones 04/19/2017   Diabetic polyneuropathy associated with type 2 diabetes mellitus (HCC) 04/07/2017   Diabetic angiopathy (HCC) 04/07/2017   Vasomotor symptoms due to menopause 04/07/2017   Mobility impaired 11/12/2016   Urinary incontinence 12/02/2015   Candidal intertrigo 04/06/2015   Cystocele 04/06/2015   Constipation 04/06/2015   Myofascial pain syndrome of lumbar spine 12/28/2014   Hypertriglyceridemia 10/14/2014   Breast mass in female 07/28/2014   Right knee pain 07/23/2013   Fall 07/23/2013   Medication management 11/20/2012   Uric acid kidney stone 11/20/2011   HYPERHIDROSIS 11/07/2009   Menopausal symptoms 06/17/2009   Type 2 diabetes mellitus without complication, without long-term current use of insulin (HCC) 08/23/2008   Vitamin D deficiency 05/07/2008   Vitamin B 12 deficiency 06/04/2007   CHRONIC FATIGUE SYNDROME 11/15/2006   DIVERTICULITIS, HX OF 11/15/2006   PCOS (polycystic ovarian syndrome) 10/16/2006   Hyperlipidemia associated with type 2 diabetes mellitus (HCC)  10/16/2006   Morbid obesity (HCC) 10/16/2006   Generalized anxiety disorder 10/16/2006   PANIC DISORDER 10/16/2006   BULIMIA 10/16/2006   Chronic depression 10/16/2006   CARPAL TUNNEL SYNDROME 10/16/2006   LYMPHEDEMA 10/16/2006   Allergic rhinitis 10/16/2006   Mild intermittent asthma 10/16/2006   TMJ SYNDROME 10/16/2006   GERD 10/16/2006   IRRITABLE BOWEL SYNDROME 10/16/2006   Osteoarthritis 10/16/2006   COUGH, CHRONIC 10/16/2006    Past Surgical History:  Procedure Laterality Date   BREAST BIOPSY Right 2016   neg   BREAST  LUMPECTOMY WITH RADIOACTIVE SEED LOCALIZATION Right 08/02/2014   Procedure: BREAST LUMPECTOMY WITH RADIOACTIVE SEED LOCALIZATION;  Surgeon: Emelia Loron, MD;  Location: [facility] OR;  Service: General;  Laterality: Right;   COLONOSCOPY  08/12/2012   Completed by Dr. Cecelia Byars, normal exam.    ENDOMETRIAL BIOPSY  01/2004   ESOPHAGOGASTRODUODENOSCOPY     ESOPHAGOGASTRODUODENOSCOPY (EGD) WITH PROPOFOL N/A 08/18/2019   Procedure: ESOPHAGOGASTRODUODENOSCOPY (EGD) WITH PROPOFOL;  Surgeon: Midge Minium, MD;  Location: [facility] ENDOSCOPY;  Service: Endoscopy;  Laterality: N/A;   FOOT SURGERY     SEPTOPLASTY     two times   TONSILLECTOMY     UTERINE FIBROID SURGERY  06/2005   ablation   uterine tumor  09/2001   VAGUS NERVE STIMULATOR INSERTION      OB History     Gravida  0   Para  0   Term  0   Preterm  0   AB  0   Living  0      SAB  0   IAB  0   Ectopic  0   Multiple  0   Live Births  0        Obstetric Comments  1st Menstrual Cycle:  12             Home Medications    Prior to Admission medications   Medication Sig Start Date End Date Taking? Authorizing Provider  doxycycline (VIBRAMYCIN) 100 MG capsule Take 1 capsule (100 mg total) by mouth 2 (two) times daily. 06/01/21  Yes Bing Neighbors, FNP  acetaminophen (TYLENOL) 500 MG tablet Take 500 mg by mouth every 6 (six) hours as needed.     [provider]  albuterol (VENTOLIN HFA) 108 (90 Base) MCG/ACT inhaler Inhale 2 puffs into the lungs every 6 (six) hours as needed for wheezing. 06/15/20   Tower, Audrie Gallus, MD  ALPRAZolam Prudy Feeler) 1 MG tablet Take 2 mg by mouth 3 (three) times daily as needed.    [provider]  Blood Glucose Monitoring Suppl (FIFTY50 GLUCOSE METER 2.0) w/Device KIT Use as directed 07/02/18   [provider]  buPROPion (WELLBUTRIN XL) 150 MG 24 hr tablet Take 150 mg by mouth daily.    [provider]  buPROPion (ZYBAN) 150 MG 12 hr tablet Take 150 mg by mouth 2  (two) times daily.    [provider]  Cholecalciferol (VITAMIN D3 PO) Take 2,000 Units by mouth daily.    [provider]  clindamycin (CLEOCIN-T) 1 % lotion Apply topically daily. Qd to bumps on face and chest 02/01/21 02/01/22  Deirdre Evener, MD  cyanocobalamin (,VITAMIN B-12,) 1000 MCG/ML injection Inject 1 mL (1,000 mcg total) into the muscle every 6 (six) weeks. 06/17/20   Tower, Audrie Gallus, MD  cyclobenzaprine (FLEXERIL) 5 MG tablet Take 1 tablet (5 mg total) by mouth 3 (three) times daily as needed for muscle spasms. 08/04/20   Edward Jolly,  MD  desvenlafaxine (PRISTIQ) 100 MG 24 hr tablet  03/12/18   [provider]  dimenhyDRINATE (DRAMAMINE) 50 MG tablet Take 50 mg by mouth every 8 (eight) hours as needed.    [provider]  Eflornithine HCl (VANIQA) 13.9 % cream Apply 1 application topically 2 (two) times daily with a meal. Bid to aa hair growth face and neck 02/01/21   Deirdre Evener, MD  fluticasone [facility]) 50 MCG/ACT nasal spray Place into both nostrils daily.    [provider]  gabapentin (NEURONTIN) 100 MG capsule TAKE 2 CAPSULES BY MOUTH EVERY MORNING AND 3 CAPSULES EVERY EVENING 01/17/21   Felecia Shelling, DPM  gabapentin (NEURONTIN) 300 MG capsule TAKE 1 CAPSULE AT BEDTIME 10/12/20   Felecia Shelling, DPM  ibuprofen (ADVIL) 600 MG tablet Take 1 tablet (600 mg total) by mouth every 8 (eight) hours as needed. 09/30/19   Lamptey, Britta Mccreedy, MD  Insulin Degludec (TRESIBA [place]) Inject into the skin. 1 injection nightly 8-10 pm    [provider]  ketoconazole (NIZORAL) 2 % cream Apply 1 application topically daily. 04/12/21   Tower, Audrie Gallus, MD  losartan (COZAAR) 25 MG tablet  05/23/17   [provider]  nystatin cream (MYCOSTATIN) APPLY TO AFFECTED AREAS 3 TIMES DAILY ASNEEDED 04/10/21   Tower, Audrie Gallus, MD  omeprazole (PRILOSEC) 20 MG capsule Take 1 capsule (20 mg total) by mouth 2 (two) times daily. 06/15/20   Tower, Audrie Gallus, MD   oxybutynin (DITROPAN XL) 15 MG 24 hr tablet TAKE 1 TABLET BY MOUTH DAILY 05/15/21   Tower, Audrie Gallus, MD  Polyethyl Glycol-Propyl Glycol (SYSTANE OP) Apply 1 drop to eye daily as needed.    [provider]  pravastatin (PRAVACHOL) 20 MG tablet 1 TABLET BY MOUTH AT BEDTIME. 05/25/21   Tower, Audrie Gallus, MD  Simethicone (GAS-X PO) Take by mouth as needed.    [provider]  spironolactone (ALDACTONE) 100 MG tablet TAKE 1 TABLET BY MOUTH DAILY. 04/04/21   Tower, Audrie Gallus, MD  tirzepatide [facility]) 7.5 MG/0.5ML Pen Inject 7.5 mg into the skin once a week. 10/01/20   [provider]  traMADol (ULTRAM) 50 MG tablet TAKE ONE TABLET TWICE A DAY IF NEEDED FOR SEVERE PAIN 09/20/20   Tower, Audrie Gallus, MD  traZODone (DESYREL) 150 MG tablet Take 300 mg by mouth at bedtime.  05/20/14   [provider]  zinc oxide (BALMEX) 11.3 % CREA cream Apply 1 application topically 2 (two) times daily.    [provider]    Family History Family History  Problem Relation Age of Onset   Hypertension Father    Cancer Father        pancreatic cancer   Hypertension Sister    Heart disease Maternal Grandmother 25       MI   Diabetes Maternal Grandmother    Heart disease Paternal Grandmother 65       MI   Diabetes Paternal Grandmother    Breast cancer Neg Hx     Social History Social History   Tobacco Use   Smoking status: Never   Smokeless tobacco: Never  Vaping Use   Vaping Use: Never used  Substance Use Topics   Alcohol use: No    Alcohol/week: 0.0 standard drinks   Drug use: No     Allergies   Cephalexin, Codeine, Duloxetine, Levaquin [levofloxacin], Lisinopril, Metformin and related, Quetiapine, Sertraline hcl, Triazolam, Zaleplon, Zocor [simvastatin - high dose], Zolpidem tartrate,  Hydrocodone, and Norelgestromin-eth estradiol   Review of Systems Review of Systems Pertinent negatives listed in HPI  Physical Exam Triage Vital Signs ED Triage Vitals  Enc Vitals  Group     BP 06/01/21 1556 112/75     Pulse Rate 06/01/21 1556 74     Resp 06/01/21 1556 18     Temp 06/01/21 1556 98.7 F (37.1 C)     Temp Source 06/01/21 1556 Oral     SpO2 06/01/21 1556 94 %     Weight --      Height --      Head Circumference --      Peak Flow --      Pain Score 06/01/21 1554 7     Pain Loc --      Pain Edu? --      Excl. in GC? --    No data found.  Updated Vital Signs BP 112/75 (BP Location: Right Wrist)   Pulse 74   Temp 98.7 F (37.1 C) (Oral)   Resp 18   SpO2 94%   Visual Acuity Right Eye Distance:   Left Eye Distance:   Bilateral Distance:    Right Eye Near:   Left Eye Near:    Bilateral Near:     Physical Exam Exam conducted with a chaperone present.  Constitutional:      Appearance: She is obese.  HENT:     Head: Normocephalic.  Cardiovascular:     Rate and Rhythm: Normal rate and regular rhythm.  Pulmonary:     Effort: Pulmonary effort is normal.     Breath sounds: Normal breath sounds.  Genitourinary:   Neurological:     Mental Status: She is alert.  Psychiatric:        Attention and Perception: Attention normal.        Mood and Affect: Mood is anxious.        Speech: Speech normal.        Behavior: Behavior is cooperative.   UC Treatments / Results  Labs (all labs ordered are listed, but only abnormal results are displayed) Labs Reviewed - No data to display  EKG   Radiology No results found.  Procedures Procedures (including critical care time)  Medications Ordered in UC Medications - No data to display  Initial Impression / Assessment and Plan / UC Course  I have reviewed the triage vital signs and the nursing notes.  Pertinent labs & imaging results that were available during my care of the patient were reviewed by me and considered in my medical decision making (see chart for details).    Open-draining abscess of the right gluteal fold, given the location of the abscess and the fact that it is  actively draining recommend using a pad to collect excessive drainage.  Was able to express a large amount of serosanguineous drainage from the open abscess.  Patient requested abscess suturing. Attempted to educate patient that suturing of wound is not indicated. Best method for healing and resolution of the abscess is to allow abscess to openly drain and to start the antibiotics to resolve any underlying infection.  Patient seemed and unpleased with this response however advised her that it would be difficult for her to change the dressing therefore a pad would be more appropriate for collection of the remaining residual drainage.  Did advise her to follow-up with her primary care provider following completion of antibiotics or if drainage has not resolved within the next 5 to 7  days.  Patient verbalized understanding. Final diagnoses:  Abscess of gluteal region   Discharge Instructions   None    ED Prescriptions     Medication Sig Dispense Auth. Provider   doxycycline (VIBRAMYCIN) 100 MG capsule Take 1 capsule (100 mg total) by mouth 2 (two) times daily. 20 capsule Bing Neighbors, FNP      PDMP not reviewed this encounter.   Bing Neighbors, FNP 06/05/21 [phone]

## 2021-06-01 NOTE — Telephone Encounter (Signed)
[facility] - Client ?TELEPHONE ADVICE RECORD ?[facility]? ?Patient ?Name: ?Mackenzie P ?Bonilla ?Gender: Female ?DOB: 11-17-1965 ?Age: 56 Y 81 M 13 ?D ?Return ?Phone ?Number: ?[phone] ?(Primary), ?[phone] ?(Secondary) ?Address: [address] ?City/ ?State/ ?Zip: ?[place] ? [address] ?Client [place] [facility] - Client ?Client Site [place] [facility] ?Provider Tower, Roque Lias - MD ?Contact Type Call ?Who Is Calling Patient / Member / Family / Caregiver ?Call Type Triage / Clinical ?Relationship To Patient Self ?Return Phone Number [phone] (Primary) ?Chief Complaint Vaginal Pain ?Reason for Call Symptomatic / Request for Health Information ?Initial Comment Olivia from the office calling to have a patient ?triaged for a bump on her vaginal area It is ?bleeding The office gave me that information the ?patient said it is bigger than a bump size of a rock ?or a ball It is very painful ?Translation No ?Nurse Assessment ?Nurse: Radford Pax, RN, Eugene Garnet Date/Time Eilene Ghazi Time): 06/01/2021 10:12:41 AM ?Confirm and document reason for call. If ?symptomatic, describe symptoms. ?---Scheduled for tomorrow at 9:40 Large painful lump ?that is bleeding. Somewhere in peri area but unsure ?exactly where ?Does the patient have any new or worsening ?symptoms? ---Yes ?Will a triage be completed? ---Yes ?Related visit to physician within the last 2 weeks? ---No ?Does the PT have any chronic conditions? (i.e. ?diabetes, asthma, this includes High risk factors for ?pregnancy, etc.) ?---Yes ?List chronic conditions. ---diabetes ?Is this a behavioral health or substance abuse call? ---No ?Guidelines ?Guideline Title Affirmed Question Affirmed Notes Nurse Date/Time (Eastern ?Time) ?Boil (Skin Abscess) SEVERE pain (e.g., ?excruciating) ?Radford Pax, RN, [place] 06/01/2021 10:16:00 ?AM ?Disp. Time (Eastern ?Time) Disposition Final User ?06/01/2021 10:24:41 AM Send To RN  Personal Radford Pax, RN, Rebekah ?PLEASE NOTE: All timestamps contained within this report are represented as [place] Standard Time. ?CONFIDENTIALTY NOTICE: This fax transmission is intended only for the addressee. It contains information that is legally privileged, confidential or ?otherwise protected from use or disclosure. If you are not the intended recipient, you are strictly prohibited from reviewing, disclosing, copying using ?or disseminating any of this information or taking any action in reliance on or regarding this information. If you have received this fax in error, please ?notify us immediately by telephone so that we can arrange for its return to [place]. Phone: [phone], Toll-Free: [phone], Fax: [phone] ?Page: 2 of 2 ?Call Id: [id number] ?06/01/2021 10:20:32 AM See HCP within 4 Hours (or ?PCP triage) ?Yes Turner, RN, Eugene Garnet ?Caller Disagree/Comply Comply ?Caller Understands Yes ?PreDisposition Call Doctor ?Care Advice Given Per Guideline ?SEE HCP (OR PCP TRIAGE) WITHIN 4 HOURS: * IF OFFICE WILL BE OPEN: You need to be seen within the next 3 or 4 ?hours. Call your doctor (or NP/PA) now or as soon as the office opens. DRESSING: * Cover with a sterile gauze or clean cloth. * Do ?this until seen. PAIN MEDICINES: * For pain relief, you can take either acetaminophen, ibuprofen, or naproxen. CALL BACK IF: * ?You become worse CARE ADVICE per Boil (Skin Abscess) (Adult) guideline. ?Comments ?User: Susie Cassette, RN Date/Time Eilene Ghazi Time): 06/01/2021 10:31:00 AM ?Was able to warm transfer to office for possible urgent appt. ?Referrals ?REFERRED TO PCP OFFIC ?

## 2021-06-01 NOTE — Telephone Encounter (Signed)
Thanks for seeing her

## 2021-06-01 NOTE — ED Triage Notes (Signed)
Pt presents with a cyst on her buttock for a few days. Pt states she started to see spots of blood yesterday and today.  ?

## 2021-06-01 NOTE — Telephone Encounter (Signed)
I spoke with pt and she said for about 1 wk felt sore area in perineal or buttock area (pt having difficult time describing where the lump pt now feels is); pt said last few days felt like was forming a lump and today the lump feels like the size of golf ball or ping pong ball with a stalk. Today has been draining blood with a sticky clear drainage also. Pt said soaked thru her panties, clothes and onto a blanket that pt was sitting on. Pt describes the lump is more toward the buttocks or rectum than the vaginal area but pt does not think it is a hemorrhoid.Pt said temp now is 97.1 pt said her schedule only permits appt today after 3:30. No available appts at [facility] after 3:30 and pts pain level is 7-8 so I scheduled pt an appt at [place] 06/01/21 at 4:15. Pt also wants to leave appt scheduled with [facility] 06/02/21 at 9:40. Pt will cb if does not need appt tomorrow and cancel that appt. Pt does not want to wind up in ED. Sending note to Dr Glori Bickers as PCP and Eugenia Pancoast FNP and Claiborne Billings CMA. UC & ED precautions given for prior instructions before pt scheduled appt today at [facility]. ?

## 2021-06-02 ENCOUNTER — Ambulatory Visit: Payer: Medicare PPO | Admitting: Podiatry

## 2021-06-02 ENCOUNTER — Ambulatory Visit: Payer: Medicare PPO | Admitting: Family

## 2021-06-02 VITALS — BP 115/68 | HR 71 | Temp 98.6°F | Resp 16 | Ht 64.0 in | Wt 287.4 lb

## 2021-06-02 DIAGNOSIS — M79675 Pain in left toe(s): Secondary | ICD-10-CM | POA: Diagnosis not present

## 2021-06-02 DIAGNOSIS — B351 Tinea unguium: Secondary | ICD-10-CM | POA: Diagnosis not present

## 2021-06-02 DIAGNOSIS — M79674 Pain in right toe(s): Secondary | ICD-10-CM

## 2021-06-02 DIAGNOSIS — T148XXA Other injury of unspecified body region, initial encounter: Secondary | ICD-10-CM | POA: Diagnosis not present

## 2021-06-02 DIAGNOSIS — L0231 Cutaneous abscess of buttock: Secondary | ICD-10-CM | POA: Insufficient documentation

## 2021-06-02 NOTE — Progress Notes (Signed)
? ?Established Patient Office Visit ? ?Subjective:  ?Patient ID: Mackenzie Bonilla, female    DOB: [birth year]  Age: 56 y.o. MRN: [id number] ? ?CC:  ?Chief Complaint  ?Patient presents with  ? Cyst  ?  Cant remember how long. Stated it bust yesterday went to UC but was not satisfied with the care she received.  ? ? ?HPI ?Mackenzie Bonilla is here today with concerns.  ? ?Pt states pain when sitting down on anything, but then yesterday noticed that she had bleeding that went through to her underwear clothes and on towel on her recliner. When she went to wipe in the bathroom she feels a tender bulge/ that has since went away because it has burst (yesterday)  ? ?Still bleeding today, pretty good amount. Still soaking through pads to wear.  ?She did go to urgent care yesterday and was send in rx for doxycycline 100 mg that she has not pick up yet.  ?Denies vaginal discharge. No bleeding from the vagina.  ?Does have constipation from time to time.  ? ?Wt Readings from Last 3 Encounters:  ?06/02/21 287 lb 7 oz (130.4 kg)  ?04/12/21 293 lb (132.9 kg)  ?02/10/21 (!) 329 lb (149.2 kg)  ? ? ? ? ?Past Medical History:  ?Diagnosis Date  ? Allergy   ? allergic rhinitis  ? Anemia   ? Anxiety   ? Arthritis   ? osteoarthritis  ? Asthma   ? Claustrophobia   ? does not like oxygen mask covering face  ? Depression   ? Diabetes mellitus without complication ([place])   ? Fall 2016  ? Fatty liver 07/2008  ? abd. ultrasound - fatty liver ; slt dilated cbd (no stones ) 06/10// abd.  ultrasound normal on 04/2006  ? Fibromyalgia   ? GERD (gastroesophageal reflux disease)   ? EGD negative 06/2001// EGD erythematous mucosa, polyp 08/2008  ? High uric acid in 24 hour urine specimen   ? Hyperhidrosis   ? Hyperlipidemia   ? Kidney stones   ? Lymphedema   ? Obesity   ? PCOS (polycystic ovarian syndrome)   ? Shortness of breath dyspnea   ? Tachycardia   ? TMJ (dislocation of temporomandibular joint)   ? UTI (lower urinary tract infection)   ? ? ?Past  Surgical History:  ?Procedure Laterality Date  ? BREAST BIOPSY Right 2016  ? neg  ? BREAST LUMPECTOMY WITH RADIOACTIVE SEED LOCALIZATION Right 08/02/2014  ? Procedure: BREAST LUMPECTOMY WITH RADIOACTIVE SEED LOCALIZATION;  Surgeon: Rolm Bookbinder, MD;  Location: [facility];  Service: General;  Laterality: Right;  ? COLONOSCOPY  08/12/2012  ? Completed by Dr. Phylis Bougie, normal exam.   ? ENDOMETRIAL BIOPSY  01/2004  ? ESOPHAGOGASTRODUODENOSCOPY    ? ESOPHAGOGASTRODUODENOSCOPY (EGD) WITH PROPOFOL N/A 08/18/2019  ? Procedure: ESOPHAGOGASTRODUODENOSCOPY (EGD) WITH PROPOFOL;  Surgeon: Lucilla Lame, MD;  Location: [facility];  Service: Endoscopy;  Laterality: N/A;  ? FOOT SURGERY    ? SEPTOPLASTY    ? two times  ? TONSILLECTOMY    ? UTERINE FIBROID SURGERY  06/2005  ? ablation  ? uterine tumor  09/2001  ? VAGUS NERVE STIMULATOR INSERTION    ? ? ?Family History  ?Problem Relation Age of Onset  ? Hypertension Father   ? Cancer Father   ?     pancreatic cancer  ? Hypertension Sister   ? Heart disease Maternal Grandmother 60  ?     MI  ? Diabetes Maternal Grandmother   ? Heart  disease Paternal Grandmother 65  ?     MI  ? Diabetes Paternal Grandmother   ? Breast cancer Neg Hx   ? ? ?Social History  ? ?Socioeconomic History  ? Marital status: Single  ?  Spouse name: Not on file  ? Number of children: Not on file  ? Years of education: Not on file  ? Highest education level: Not on file  ?Occupational History  ? Not on file  ?Tobacco Use  ? Smoking status: Never  ? Smokeless tobacco: Never  ?Vaping Use  ? Vaping Use: Never used  ?Substance and Sexual Activity  ? Alcohol use: No  ?  Alcohol/week: 0.0 standard drinks  ? Drug use: No  ? Sexual activity: Not Currently  ?  Birth control/protection: None, Post-menopausal  ?Other Topics Concern  ? Not on file  ?Social History Narrative  ? Not on file  ? ?Social Determinants of Health  ? ?Financial Resource Strain: Not on file  ?Food Insecurity: Not on file  ?Transportation Needs: Not on  file  ?Physical Activity: Not on file  ?Stress: Not on file  ?Social Connections: Not on file  ?Intimate Partner Violence: Not on file  ? ? ?Outpatient Medications Prior to Visit  ?Medication Sig Dispense Refill  ? acetaminophen (TYLENOL) 500 MG tablet Take 500 mg by mouth every 6 (six) hours as needed.     ? albuterol (VENTOLIN HFA) 108 (90 Base) MCG/ACT inhaler Inhale 2 puffs into the lungs every 6 (six) hours as needed for wheezing. 18 g 3  ? ALPRAZolam (XANAX) 1 MG tablet Take 2 mg by mouth 3 (three) times daily as needed.    ? Blood Glucose Monitoring Suppl (FIFTY50 GLUCOSE METER 2.0) w/Device KIT Use as directed    ? buPROPion (WELLBUTRIN XL) 150 MG 24 hr tablet Take 150 mg by mouth daily.    ? buPROPion (ZYBAN) 150 MG 12 hr tablet Take 150 mg by mouth 2 (two) times daily.    ? Cholecalciferol (VITAMIN D3 PO) Take 2,000 Units by mouth daily.    ? clindamycin (CLEOCIN-T) 1 % lotion Apply topically daily. Qd to bumps on face and chest 60 mL 6  ? cyanocobalamin (,VITAMIN B-12,) 1000 MCG/ML injection Inject 1 mL (1,000 mcg total) into the muscle every 6 (six) weeks. 1 mL 9  ? cyclobenzaprine (FLEXERIL) 5 MG tablet Take 1 tablet (5 mg total) by mouth 3 (three) times daily as needed for muscle spasms. 90 tablet 2  ? desvenlafaxine (PRISTIQ) 100 MG 24 hr tablet     ? dimenhyDRINATE (DRAMAMINE) 50 MG tablet Take 50 mg by mouth every 8 (eight) hours as needed.    ? doxycycline (VIBRAMYCIN) 100 MG capsule Take 1 capsule (100 mg total) by mouth 2 (two) times daily. 20 capsule 0  ? Eflornithine HCl (VANIQA) 13.9 % cream Apply 1 application topically 2 (two) times daily with a meal. Bid to aa hair growth face and neck 45 g 6  ? fluticasone (FLONASE) 50 MCG/ACT nasal spray Place into both nostrils daily.    ? gabapentin (NEURONTIN) 100 MG capsule TAKE 2 CAPSULES BY MOUTH EVERY MORNING AND 3 CAPSULES EVERY EVENING 150 capsule 0  ? gabapentin (NEURONTIN) 300 MG capsule TAKE 1 CAPSULE AT BEDTIME 90 capsule 3  ? ibuprofen  (ADVIL) 600 MG tablet Take 1 tablet (600 mg total) by mouth every 8 (eight) hours as needed. 30 tablet 0  ? Insulin Degludec (TRESIBA [place]) Inject into the skin. 1 injection nightly 8-10  pm    ? ketoconazole (NIZORAL) 2 % cream Apply 1 application topically daily. 60 g 2  ? losartan (COZAAR) 25 MG tablet     ? nystatin cream (MYCOSTATIN) APPLY TO AFFECTED AREAS 3 TIMES DAILY ASNEEDED 30 g 3  ? omeprazole (PRILOSEC) 20 MG capsule Take 1 capsule (20 mg total) by mouth 2 (two) times daily. 180 capsule 3  ? oxybutynin (DITROPAN XL) 15 MG 24 hr tablet TAKE 1 TABLET BY MOUTH DAILY 90 tablet 1  ? Polyethyl Glycol-Propyl Glycol (SYSTANE OP) Apply 1 drop to eye daily as needed.    ? pravastatin (PRAVACHOL) 20 MG tablet 1 TABLET BY MOUTH AT BEDTIME. 90 tablet 3  ? Simethicone (GAS-X PO) Take by mouth as needed.    ? spironolactone (ALDACTONE) 100 MG tablet TAKE 1 TABLET BY MOUTH DAILY. 90 tablet 0  ? tirzepatide (MOUNJARO) 7.5 MG/0.5ML Pen Inject 7.5 mg into the skin once a week.    ? traMADol (ULTRAM) 50 MG tablet TAKE ONE TABLET TWICE A DAY IF NEEDED FOR SEVERE PAIN 30 tablet 0  ? traZODone (DESYREL) 150 MG tablet Take 300 mg by mouth at bedtime.     ? zinc oxide (BALMEX) 11.3 % CREA cream Apply 1 application topically 2 (two) times daily.    ? ?No facility-administered medications prior to visit.  ? ? ?Allergies  ?Allergen Reactions  ? Cephalexin   ? Codeine   ?  rash  ? Duloxetine   ? Levaquin [Levofloxacin] Nausea Only  ? Lisinopril Cough  ? Metformin And Related Diarrhea  ?  GI side effects   ? Quetiapine Other (See Comments)  ? Sertraline Hcl   ?  REACTION: vivid dreams  ? Triazolam   ?  REACTION: ? reaction  ? Zaleplon   ? Zocor [Simvastatin - High Dose]   ?  achey  ? Zolpidem Tartrate   ?  hallucinations  ? Hydrocodone Itching and Rash  ?  REACTION: redness rash itching  ? Norelgestromin-Eth Estradiol   ?  Patch didn't stick to skin  ? ? ?ROS ?Review of Systems  ?Constitutional:  Negative for chills and fatigue.   ?Respiratory:  Negative for cough and shortness of breath.   ?Cardiovascular:  Negative for chest pain and leg swelling.  ?Gastrointestinal:  Negative for diarrhea and nausea.  ?Genitourinary:  Negative for diffi

## 2021-06-02 NOTE — Progress Notes (Signed)
? ?  SUBJECTIVE ?Patient presents today for follow-up evaluation regarding bilateral foot pain and swelling.  Patient states that she is doing well.  She states that she does have a hard time getting to her feet to properly care for her feet.  Her nails are long and tender.  She would like to have them evaluated and trim today. ? ?Past Medical History:  ?Diagnosis Date  ? Allergy   ? allergic rhinitis  ? Anemia   ? Anxiety   ? Arthritis   ? osteoarthritis  ? Asthma   ? Claustrophobia   ? does not like oxygen mask covering face  ? Depression   ? Diabetes mellitus without complication ([place])   ? Fall 2016  ? Fatty liver 07/2008  ? abd. ultrasound - fatty liver ; slt dilated cbd (no stones ) 06/10// abd.  ultrasound normal on 04/2006  ? Fibromyalgia   ? GERD (gastroesophageal reflux disease)   ? EGD negative 06/2001// EGD erythematous mucosa, polyp 08/2008  ? High uric acid in 24 hour urine specimen   ? Hyperhidrosis   ? Hyperlipidemia   ? Kidney stones   ? Lymphedema   ? Obesity   ? PCOS (polycystic ovarian syndrome)   ? Shortness of breath dyspnea   ? Tachycardia   ? TMJ (dislocation of temporomandibular joint)   ? UTI (lower urinary tract infection)   ? ? ?OBJECTIVE ?General Patient is awake, alert, and oriented x 3 and in no acute distress. ?Derm Skin is dry and supple bilateral. Negative open lesions or macerations. Remaining integument unremarkable. Nails are tender, long, thickened and dystrophic with subungual debris, consistent with onychomycosis, 1-5 bilateral. No signs of infection noted.  Hyperkeratotic preulcerative callus tissue noted to the bilateral feet and toes ?Vasc  DP and PT pedal pulses palpable bilaterally. Temperature gradient within normal limits.  Edema noted bilateral lower extremities ?Neuro Epicritic and protective threshold sensation grossly intact bilaterally.  ?Musculoskeletal Exam No symptomatic pedal deformities noted bilateral. Muscular strength within normal limits. ?ASSESSMENT ?1.   Chronic bilateral lower extremity foot pain ?2.  Bilateral lower extremity edema ?3.  Pain due to onychomycosis of toenail bilateral ? ?PLAN OF CARE ?1. Patient evaluated today.  ?2. Instructed to maintain good pedal hygiene and foot care.  ?3.  Continue wearing [place] shoes from [employer] running store ?4.  Continue to recommend applying Revitaderm urea 40% lotion that was provided last visit ?5.  Mechanical debridement of nails 1-5 was performed using a nail nipper without incident or bleeding  ?6.  Return to clinic 3 months ? ? ?Edrick Kins, DPM ?[place] ? ?Dr. Edrick Kins, DPM  ?  ?[address]                                     ?[place], [place] [address]                ?Office [phone]  ?Fax [phone] ? ? ? ? ?

## 2021-06-02 NOTE — Patient Instructions (Signed)
Start taking the doxycycline that was sent, ensure it was 100 mg twice daily for ten days.  ? ?A referral was placed today for wound care.  ?Please let us know if you have not heard back within 2 weeks about the referral. ? ?Regards,  ? ?Lindberg Zenon ?FNP-C ? ? ? ?

## 2021-06-02 NOTE — Assessment & Plan Note (Addendum)
Concern for more extensive wound requiring debridement and or possible fistula internally.  Referring for urgent referral to general surgery. ? ?While in the office I packed the wound with Betadine packing strips using hemostat, and covered with gauze and paper tape.  Patient to monitor site daily for worsening signs or symptoms of infection.  Patient still with ongoing drainage wound culture also collected and sent out to the lab ? ?Patient with prescription of doxycycline from urgent care yesterday for 10 days I agree with this prescription and would like patient to continue twice daily for 10 days ? ?Total time looking over urgent care visit yesterday as well as going over acute concerns and assessing the wound and packing totaled 34 minutes in the office. ?

## 2021-06-02 NOTE — Assessment & Plan Note (Signed)
Referring to general surgery and antibiotics prescribed ?

## 2021-06-05 DIAGNOSIS — E1142 Type 2 diabetes mellitus with diabetic polyneuropathy: Secondary | ICD-10-CM | POA: Diagnosis not present

## 2021-06-05 DIAGNOSIS — Z6841 Body Mass Index (BMI) 40.0 and over, adult: Secondary | ICD-10-CM | POA: Diagnosis not present

## 2021-06-05 DIAGNOSIS — R809 Proteinuria, unspecified: Secondary | ICD-10-CM | POA: Diagnosis not present

## 2021-06-05 DIAGNOSIS — E1122 Type 2 diabetes mellitus with diabetic chronic kidney disease: Secondary | ICD-10-CM | POA: Diagnosis not present

## 2021-06-05 DIAGNOSIS — N1831 Chronic kidney disease, stage 3a: Secondary | ICD-10-CM | POA: Diagnosis not present

## 2021-06-05 DIAGNOSIS — E1129 Type 2 diabetes mellitus with other diabetic kidney complication: Secondary | ICD-10-CM | POA: Diagnosis not present

## 2021-06-05 DIAGNOSIS — E1169 Type 2 diabetes mellitus with other specified complication: Secondary | ICD-10-CM | POA: Diagnosis not present

## 2021-06-05 DIAGNOSIS — E669 Obesity, unspecified: Secondary | ICD-10-CM | POA: Diagnosis not present

## 2021-06-05 LAB — WOUND CULTURE
MICRO NUMBER:: 13296151
SPECIMEN QUALITY:: ADEQUATE

## 2021-06-05 NOTE — Telephone Encounter (Signed)
[facility] Night - Client ?TELEPHONE ADVICE RECORD ?AccessNurse? ?Patient ?Name: ?Mackenzie P ?Bonilla ?Gender: Female ?DOB: [birth year] ?Age: 56 Y 24 M 15 ?D ?Return ?Phone ?Number: ?[id number] ?(Primary) ?Address: ?City/ ?State/ ?Zip: ?[place] ? [address] ?Client [facility] Night - Client ?Client Site [place] ?Provider Tower, Roque Lias - MD ?Contact Type Call ?Who Is Calling Patient / Member / Family / Caregiver ?Call Type Triage / Clinical ?Relationship To Patient Self ?Return Phone Number [phone] (Primary) ?Chief Complaint Wound Check or Dressing Change ?Reason for Call Symptomatic / Request for Health Information ?Initial Comment Caller has an abscess on her left buttocks and the ?office put some gauze in the abscess. The wound ?is still bleeding. Should the caller worry about the ?bleeding? ?Translation No ?Nurse Assessment ?Nurse: Clovis Riley RN, Georgina Peer Date/Time (Eastern Time): 06/03/2021 11:16:24 AM ?Confirm and document reason for call. If ?symptomatic, describe symptoms. ?---Caller has an abscess on her right buttocks that ?busted on it all. The office packed the wound ?with some gauze yesterday. States The wound is ?still bleeding that is bright red blood. She is on ?doxycycline. States she has some pain with sitting. ?Does the patient have any new or worsening ?symptoms? ---Yes ?Will a triage be completed? ---Yes ?Related visit to physician within the last 2 weeks? ---Yes ?Does the PT have any chronic conditions? (i.e. ?diabetes, asthma, this includes High risk factors for ?pregnancy, etc.) ?---Yes ?List chronic conditions. ---diabetes, ?Is this a behavioral health or substance abuse call? ---No ?Guidelines ?Guideline Title Affirmed Question Affirmed Notes Nurse Date/Time (Eastern ?Time) ?Wound Infection [1] Taking antibiotic ?< 72 hours (3 days) ?AND [2] infected ?wound doesn't look ?better ?Clovis Riley RNGeorgina Peer 06/03/2021  11:23:21 ?AM ?PLEASE NOTE: All timestamps contained within this report are represented as [place] Standard Time. ?CONFIDENTIALTY NOTICE: This fax transmission is intended only for the addressee. It contains information that is legally privileged, confidential or ?otherwise protected from use or disclosure. If you are not the intended recipient, you are strictly prohibited from reviewing, disclosing, copying using ?or disseminating any of this information or taking any action in reliance on or regarding this information. If you have received this fax in error, please ?notify us immediately by telephone so that we can arrange for its return to [place]. Phone: [phone], Toll-Free: [phone], Fax: [phone] ?Page: 2 of 2 ?Call Id: [id number] ?Disp. Time (Eastern ?Time) Disposition Final User ?06/03/2021 11:32:53 AM Home Care Yes Clovis Riley, RN, Georgina Peer ?Caller Disagree/Comply Comply ?Caller Understands Yes ?PreDisposition Did not know what to do ?Care Advice Given Per Guideline ?HOME CARE: * You should be able to treat this at home. REASSURANCE AND EDUCATION: * Most bacterial wound ?infections do not respond to the first dose of an antibiotic * Often there is no improvement the first day * Wounds gradually get better ?over 2 to 3 days CALL BACK IF: * Fever lasts over 2 days on antibiotics * Symptoms don't improve by day 4 on antibiotics * You ?become worse CARE ADVICE per Wound Infection (Adult) guideline ?

## 2021-06-05 NOTE — Telephone Encounter (Signed)
[facility] Night - Client ?TELEPHONE ADVICE RECORD ?AccessNurse? ?Patient ?Name: ?Mackenzie P ?Bonilla ?Gender: Female ?DOB: 1965/06/23 ?Age: 56 Y 64 M 16 ?D ?Return ?Phone ?Number: ?[id number] ?(Primary) ?Address: ?City/ ?State/ ?Zip: ?[place] ? [address] ?Client [facility] Night - Client ?Client Site [facility] ?Provider Tower, Roque Lias - MD ?Contact Type Call ?Who Is Calling Patient / Member / Family / Caregiver ?Call Type Triage / Clinical ?Relationship To Patient Self ?Return Phone Number [phone] (Primary) ?Chief Complaint Wound Check or Dressing Change ?Reason for Call Symptomatic / Request for Health Information ?Initial Comment Caller states she was in the office last week for ?a infected abscess. The packing and tape on the ?wound has come out. ?Translation No ?Nurse Assessment ?Nurse: Michae Kava, RN, Olivia Date/Time (Eastern Time): 06/04/2021 4:42:54 PM ?Confirm and document reason for call. If ?symptomatic, describe symptoms. ?---Caller states she was in the office last week for a ?infected abscess on her bottom. The packing and tape ?on the wound has come out. States that she took a ?shower and the packing got wet. She is unable to see ?the area, but assumes that it is open. States she was ?told in the office that she could take a shower. States ?that MD told her that the packing may come out when ?tape gets wet. ?Does the patient have any new or worsening ?symptoms? ---Yes ?Will a triage be completed? ---Yes ?Related visit to physician within the last 2 weeks? ---Yes ?Does the PT have any chronic conditions? (i.e. ?diabetes, asthma, this includes High risk factors for ?pregnancy, etc.) ?---Yes ?List chronic conditions. ---diabetes ?Is this a behavioral health or substance abuse call? ---No ?Guidelines ?Guideline Title Affirmed Question Affirmed Notes Nurse Date/Time (Eastern ?Time) ?Post-Op Incision ?Symptoms  and ?Questions ?Dressing soaked with ?blood or body fluid ?(e.g., drainage) ?Shurden, RN, Olivia 06/04/2021 4:47:48 ?PM ?PLEASE NOTE: All timestamps contained within this report are represented as [place] Standard Time. ?CONFIDENTIALTY NOTICE: This fax transmission is intended only for the addressee. It contains information that is legally privileged, confidential or ?otherwise protected from use or disclosure. If you are not the intended recipient, you are strictly prohibited from reviewing, disclosing, copying using ?or disseminating any of this information or taking any action in reliance on or regarding this information. If you have received this fax in error, please ?notify us immediately by telephone so that we can arrange for its return to [place]. Phone: [phone], Toll-Free: [phone], Fax: [phone] ?Page: 2 of 2 ?Call Id: [id number] ?Disp. Time (Eastern ?Time) Disposition Final User ?06/04/2021 5:18:51 PM Called On-Call Provider Michae Kava, RN, Minette Brine ?06/04/2021 5:10:42 PM Call PCP Now Yes Shurden, RN, Minette Brine ?Caller Disagree/Comply Comply ?Caller Understands Yes ?PreDisposition Call a family member ?Care Advice Given Per Guideline ?CALL PCP NOW: * You need to discuss this with your doctor (or NP/PA). * I'll page the on-call provider now. If you haven't heard ?from the provider (or me) within 30 minutes, call again. CALL BACK IF: * Fever over 100.4? F (38.0? C) * Wound looks infected ?(e.g., increasing redness, tenderness, pus-like drainage) * You become worse CARE ADVICE per Post-Op Incision Symptoms and ?Questions (Adult) guideline. ?Comments ?User: Larey Seat, RN Date/Time Eilene Ghazi Time): 06/04/2021 4:46:03 PM ?states that the wound has been seeping through underwear, and that the wound is either right above or below the ?underwear ?User: Larey Seat, RN Date/Time Eilene Ghazi Time): 06/04/2021 4:49:37 PM ?has appt with surgeon tuesday ?User: Larey Seat, RN Date/Time Eilene Ghazi  Time): 06/04/2021  5:33:14 PM ?patient with questions about taking prescribed doxy with dairy products, No interactions with dairy products ?noted on drugs.com, but advised to space dairy consumption and medication administration out 1-2 hours. Also ?instructed patient that if she had to secure sanitary pad or gauze to area, that she could use small amount of paper ?tape or other type of tape that she is not allergic to ?Paging ?DoctorName Phone DateTime Result/ ?Outcome Message Type Notes ?Copland, Spencer - ?MD [phone] ?06/04/2021 ?5:18:51 ?PM ?Called On ?Call Provider - ?Reached ?Doctor Paged ?Copland, Spencer - ?MD ?06/04/2021 ?5:23:57 ?PM ?Spoke with On ?Call - General Message Result ?Spoke to MD on call. States ?that it is ok that packing fell ?out and probably good for the ?area to drain. States that she can ?either use a gauze pad or sanitary ?pad to collect the drainage. ?Verbalized understanding ?

## 2021-06-05 NOTE — Telephone Encounter (Signed)
Per appt notes pt has appt with surgeon on 06/06/21. Sending note to Dr Glori Bickers and [place] CMA. ?

## 2021-06-05 NOTE — Telephone Encounter (Signed)
Left VM requesting pt to call the office back 

## 2021-06-05 NOTE — Telephone Encounter (Signed)
Thanks for letting us know, if the packing came out that is ok  ?Keep area clean and lightly cover if able ?Follow up with the surgeon on 4/25 as planned  ?

## 2021-06-06 ENCOUNTER — Ambulatory Visit: Payer: Medicare PPO | Admitting: Surgery

## 2021-06-06 ENCOUNTER — Encounter: Payer: Self-pay | Admitting: Surgery

## 2021-06-06 DIAGNOSIS — L0231 Cutaneous abscess of buttock: Secondary | ICD-10-CM | POA: Diagnosis not present

## 2021-06-06 NOTE — Progress Notes (Signed)
Patient ID: Mackenzie Bonilla, female   DOB: April 03, 1965, 56 y.o.   MRN: [id number] ? ?Chief Complaint: Draining abscess right medial buttock. ? ?History of Present Illness ?Mackenzie Bonilla is a 56 y.o. female with a draining abscess that burst about a week ago with drainage of pus and blood.  Reports the pain is a 7 out of 10.  She denies fevers, chills.  She reports that her blood sugars have been under good control, and she continues to lose weight.  She denies any bowel feculent discharge, but is having trouble protecting her clothing from the drainage.  She reports she had some packing initially which fell out. ? ?Past Medical History ?Past Medical History:  ?Diagnosis Date  ? Allergy   ? allergic rhinitis  ? Anemia   ? Anxiety   ? Arthritis   ? osteoarthritis  ? Asthma   ? Claustrophobia   ? does not like oxygen mask covering face  ? Depression   ? Diabetes mellitus without complication ([place])   ? Fall 2016  ? Fatty liver 07/2008  ? abd. ultrasound - fatty liver ; slt dilated cbd (no stones ) 06/10// abd.  ultrasound normal on 04/2006  ? Fibromyalgia   ? GERD (gastroesophageal reflux disease)   ? EGD negative 06/2001// EGD erythematous mucosa, polyp 08/2008  ? High uric acid in 24 hour urine specimen   ? Hyperhidrosis   ? Hyperlipidemia   ? Kidney stones   ? Lymphedema   ? Obesity   ? PCOS (polycystic ovarian syndrome)   ? Shortness of breath dyspnea   ? Tachycardia   ? TMJ (dislocation of temporomandibular joint)   ? UTI (lower urinary tract infection)   ?  ? ? ?Past Surgical History:  ?Procedure Laterality Date  ? BREAST BIOPSY Right 2016  ? neg  ? BREAST LUMPECTOMY WITH RADIOACTIVE SEED LOCALIZATION Right 08/02/2014  ? Procedure: BREAST LUMPECTOMY WITH RADIOACTIVE SEED LOCALIZATION;  Surgeon: Rolm Bookbinder, MD;  Location: [facility];  Service: General;  Laterality: Right;  ? COLONOSCOPY  08/12/2012  ? Completed by Dr. Phylis Bougie, normal exam.   ? ENDOMETRIAL BIOPSY  01/2004  ? ESOPHAGOGASTRODUODENOSCOPY    ?  ESOPHAGOGASTRODUODENOSCOPY (EGD) WITH PROPOFOL N/A 08/18/2019  ? Procedure: ESOPHAGOGASTRODUODENOSCOPY (EGD) WITH PROPOFOL;  Surgeon: Lucilla Lame, MD;  Location: [facility] ENDOSCOPY;  Service: Endoscopy;  Laterality: N/A;  ? FOOT SURGERY    ? SEPTOPLASTY    ? two times  ? TONSILLECTOMY    ? UTERINE FIBROID SURGERY  06/2005  ? ablation  ? uterine tumor  09/2001  ? VAGUS NERVE STIMULATOR INSERTION    ? ? ?Allergies  ?Allergen Reactions  ? Cephalexin   ? Codeine   ?  rash  ? Duloxetine   ? Levaquin [Levofloxacin] Nausea Only  ? Lisinopril Cough  ? Metformin And Related Diarrhea  ?  GI side effects   ? Quetiapine Other (See Comments)  ? Sertraline Hcl   ?  REACTION: vivid dreams  ? Triazolam   ?  REACTION: ? reaction  ? Zaleplon   ? Zocor [Simvastatin - High Dose]   ?  achey  ? Zolpidem Tartrate   ?  hallucinations  ? Hydrocodone Itching and Rash  ?  REACTION: redness rash itching  ? Norelgestromin-Eth Estradiol   ?  Patch didn't stick to skin  ? ? ?Current Outpatient Medications  ?Medication Sig Dispense Refill  ? acetaminophen (TYLENOL) 500 MG tablet Take 500 mg by mouth every 6 (six) hours as  needed.     ? albuterol (VENTOLIN HFA) 108 (90 Base) MCG/ACT inhaler Inhale 2 puffs into the lungs every 6 (six) hours as needed for wheezing. 18 g 3  ? ALPRAZolam (XANAX) 1 MG tablet Take 2 mg by mouth 3 (three) times daily as needed.    ? Blood Glucose Monitoring Suppl (FIFTY50 GLUCOSE METER 2.0) w/Device KIT Use as directed    ? buPROPion (WELLBUTRIN XL) 150 MG 24 hr tablet Take 150 mg by mouth daily.    ? buPROPion (ZYBAN) 150 MG 12 hr tablet Take 150 mg by mouth 2 (two) times daily.    ? Cholecalciferol (VITAMIN D3 PO) Take 2,000 Units by mouth daily.    ? clindamycin (CLEOCIN-T) 1 % lotion Apply topically daily. Qd to bumps on face and chest 60 mL 6  ? cyanocobalamin (,VITAMIN B-12,) 1000 MCG/ML injection Inject 1 mL (1,000 mcg total) into the muscle every 6 (six) weeks. 1 mL 9  ? cyclobenzaprine (FLEXERIL) 5 MG tablet Take 1  tablet (5 mg total) by mouth 3 (three) times daily as needed for muscle spasms. 90 tablet 2  ? desvenlafaxine (PRISTIQ) 100 MG 24 hr tablet     ? dimenhyDRINATE (DRAMAMINE) 50 MG tablet Take 50 mg by mouth every 8 (eight) hours as needed.    ? doxycycline (VIBRAMYCIN) 100 MG capsule Take 1 capsule (100 mg total) by mouth 2 (two) times daily. 20 capsule 0  ? Eflornithine HCl (VANIQA) 13.9 % cream Apply 1 application topically 2 (two) times daily with a meal. Bid to aa hair growth face and neck 45 g 6  ? fluticasone (FLONASE) 50 MCG/ACT nasal spray Place into both nostrils daily.    ? gabapentin (NEURONTIN) 100 MG capsule TAKE 2 CAPSULES BY MOUTH EVERY MORNING AND 3 CAPSULES EVERY EVENING 150 capsule 0  ? gabapentin (NEURONTIN) 300 MG capsule TAKE 1 CAPSULE AT BEDTIME 90 capsule 3  ? ibuprofen (ADVIL) 600 MG tablet Take 1 tablet (600 mg total) by mouth every 8 (eight) hours as needed. 30 tablet 0  ? Insulin Degludec (TRESIBA Altha) Inject into the skin. 1 injection nightly 8-10 pm    ? ketoconazole (NIZORAL) 2 % cream Apply 1 application topically daily. 60 g 2  ? losartan (COZAAR) 25 MG tablet     ? nystatin cream (MYCOSTATIN) APPLY TO AFFECTED AREAS 3 TIMES DAILY ASNEEDED 30 g 3  ? omeprazole (PRILOSEC) 20 MG capsule Take 1 capsule (20 mg total) by mouth 2 (two) times daily. 180 capsule 3  ? oxybutynin (DITROPAN XL) 15 MG 24 hr tablet TAKE 1 TABLET BY MOUTH DAILY 90 tablet 1  ? Polyethyl Glycol-Propyl Glycol (SYSTANE OP) Apply 1 drop to eye daily as needed.    ? pravastatin (PRAVACHOL) 20 MG tablet 1 TABLET BY MOUTH AT BEDTIME. 90 tablet 3  ? Simethicone (GAS-X PO) Take by mouth as needed.    ? spironolactone (ALDACTONE) 100 MG tablet TAKE 1 TABLET BY MOUTH DAILY. 90 tablet 0  ? tirzepatide (MOUNJARO) 7.5 MG/0.5ML Pen Inject 7.5 mg into the skin once a week.    ? traMADol (ULTRAM) 50 MG tablet TAKE ONE TABLET TWICE A DAY IF NEEDED FOR SEVERE PAIN 30 tablet 0  ? traZODone (DESYREL) 150 MG tablet Take 300 mg by mouth at  bedtime.     ? zinc oxide (BALMEX) 11.3 % CREA cream Apply 1 application topically 2 (two) times daily.    ? ?No current facility-administered medications for this visit.  ? ? ?  Family History ?Family History  ?Problem Relation Age of Onset  ? Hypertension Father   ? Cancer Father   ?     pancreatic cancer  ? Hypertension Sister   ? Heart disease Maternal Grandmother 60  ?     MI  ? Diabetes Maternal Grandmother   ? Heart disease Paternal Grandmother 80  ?     MI  ? Diabetes Paternal Grandmother   ? Breast cancer Neg Hx   ?  ? ? ?Social History ?Social History  ? ?Tobacco Use  ? Smoking status: Never  ? Smokeless tobacco: Never  ?Vaping Use  ? Vaping Use: Never used  ?Substance Use Topics  ? Alcohol use: No  ?  Alcohol/week: 0.0 standard drinks  ? Drug use: No  ?  ?  ? ? ?Review of Systems  ?Constitutional:  Positive for malaise/fatigue.  ?HENT: Negative.    ?Eyes:  Positive for blurred vision.  ?Respiratory:  Positive for sputum production and shortness of breath.   ?Cardiovascular:  Positive for leg swelling (Lymphedema).  ?Gastrointestinal:  Positive for constipation and nausea.  ?Genitourinary:  Positive for urgency.  ?Skin:  Negative for itching and rash.  ?Neurological:  Positive for dizziness.  ?Psychiatric/Behavioral:  Positive for depression.   ?  ? ?Physical Exam ?There were no vitals taken for this visit. ? ? ?CONSTITUTIONAL: Well developed, and nourished, appropriately responsive and aware without distress.   ?EYES: Sclera non-icteric.   ?EARS, NOSE, MOUTH AND THROAT:  The oropharynx is clear. Oral mucosa is pink and moist.   Hearing is intact to voice.  ?NECK: Trachea is midline, and there is no jugular venous distension.  ?LYMPH NODES:  Lymph nodes in the neck are not appreciable. ?RESPIRATORY:  Normal respiratory effort without pathologic use of accessory muscles. ?CARDIOVASCULAR: Heart is regular in rate ?GI: The abdomen is  soft, nontender, and nondistended.  Obese. ?GU: Levada Dy is present as  chaperone.  There is a well drained abscess on the medial anterior right buttock about parallel with the perineal body.  There are 2 small holes with a bridge of skin between them.  I probed the largest of the hol

## 2021-06-06 NOTE — Patient Instructions (Addendum)
If you have any concerns or questions, please feel free to call our office.  ? ?Skin Abscess ? ?A skin abscess is an infected area of your skin that contains pus and other material. An abscess can happen in any part of your body. Some abscesses break open (rupture) on their own. Most continue to get worse unless they are treated. The infection can spread deeper into the body and into your blood, which can make you feel sick. ?A skin abscess is caused by germs that enter the skin through a cut or scrape. It can also be caused by blocked oil and sweat glands or infected hair follicles. ?This condition is usually treated by: ?Draining the pus. ?Taking antibiotic medicines. ?Placing a warm, wet washcloth over the abscess. ?Follow these instructions at home: ?Medicines ? ?Take over-the-counter and prescription medicines only as told by your doctor. ?If you were prescribed an antibiotic medicine, take it as told by your doctor. Do not stop taking the antibiotic even if you start to feel better. ?Abscess care ? ?If you have an abscess that has not drained, place a warm, clean, wet washcloth over the abscess several times a day. Do this as told by your doctor. ?Follow instructions from your doctor about how to take care of your abscess. Make sure you: ?Cover the abscess with a bandage (dressing). ?Change your bandage or gauze as told by your doctor. ?Wash your hands with soap and water before you change the bandage or gauze. If you cannot use soap and water, use hand sanitizer. ?Check your abscess every day for signs that the infection is getting worse. Check for: ?More redness, swelling, or pain. ?More fluid or blood. ?Warmth. ?More pus or a bad smell. ?General instructions ?To avoid spreading the infection: ?Do not share personal care items, towels, or hot tubs with others. ?Avoid making skin-to-skin contact with other people. ?Keep all follow-up visits as told by your doctor. This is important. ?Contact a doctor if: ?You  have more redness, swelling, or pain around your abscess. ?You have more fluid or blood coming from your abscess. ?Your abscess feels warm when you touch it. ?You have more pus or a bad smell coming from your abscess. ?Your muscles ache. ?You feel sick. ?Get help right away if: ?You have very bad (severe) pain. ?You see red streaks on your skin spreading away from the abscess. ?You see redness that spreads quickly. ?You have a fever or chills. ?Summary ?A skin abscess is an infected area of your skin that contains pus and other material. ?The abscess is caused by germs that enter the skin through a cut or scrape. It can also be caused by blocked oil and sweat glands or infected hair follicles. ?Follow your doctor's instructions on caring for your abscess, taking medicines, preventing infections, and keeping follow-up visits. ?This information is not intended to replace advice given to you by your health care provider. Make sure you discuss any questions you have with your health care provider. ?Document Revised: 11/07/2020 Document Reviewed: 11/07/2020 ?Elsevier Patient Education ? [place]. ? ?

## 2021-06-06 NOTE — Telephone Encounter (Signed)
Pt didn't return my call but did have f/u appt with surgeon today  ?

## 2021-06-08 ENCOUNTER — Telehealth: Payer: Self-pay

## 2021-06-08 NOTE — Telephone Encounter (Signed)
I spoke with pt and advised for pt to contact surgeons office she saw on 06/06/21. Pt said she spoke to surgeon's nurse on 06/07/21 and was advised to keep the area clean and pt said surgeon had told pt to soak in luke warm tub of water but pt said she cannot get into tub. Pt is concerned that she is not able to dress the area and the area is open and pt is afraid that the wound will get infected. Pt said she does not have anyone that can change a dressing.pt wants to know why the wound care referral was cancelled and is home health a possibility to help pt change dressings. I advised pt that the wound care referral may have been cancelled due to the need for pt to see a surgeon. Pt wants to know for sure why the wound care referral was cancelled. Pt request cb when reviewed by provider. Dr Glori Bickers is out of office this afternoon and pt request note sent to Eugenia Pancoast FNP who pt last saw on 06/02/2020.  ?

## 2021-06-08 NOTE — Telephone Encounter (Signed)
Mackenzie Pancoast, FNP ?to Me  Tammi Sou, CMA   ?   4:13 PM ? Yes, you are correct Khila Papp.  ?Would care was cancelled as this was tunneled and required surgical intervention. Also correct, pt needs to call back surgeon to see about wound dressings/home care if needed/etc. If he deems necessary for wound dressing changes surgery should likely be setting that up. Please tell pt to let us know if doesn't get any response by tomorrow.  ? ?If she is worried infected/fever etc go to er or urgent care as later in the day today.  ? ? ?Pt notified as instructed and pt said she would call surgeon's office now and pt will let our office know if she needs anything further. Sending note to Red Christians FNP and Dr Carole Binning as PCP. Will send to [place] as well. ?

## 2021-06-08 NOTE — Telephone Encounter (Signed)
[facility] - Client ?TELEPHONE ADVICE RECORD ?[facility]? ?Patient ?Name: ?Mackenzie P ?Bonilla ?Gender: Female ?DOB: [birth year] ?Age: 56 Y 58 M 20 ?D ?Return ?Phone ?Number: ?[phone] ?(Primary), ?[id number] ?(Secondary) ?Address: [address] ?City/ ?State/ ?Zip: ?[place] ? [address] ?Client [facility] - Client ?Client Site [facility] - Day ?Provider [place], Roque Lias - MD ?Contact Type Call ?Who Is Calling Patient / Member / Family / Caregiver ?Call Type Triage / Clinical ?Relationship To Patient Self ?Return Phone Number [phone] (Primary) ?Chief Complaint Wound Check or Dressing Change ?Reason for Call Symptomatic / Request for Health Information ?Initial Comment Caller states she is being treated for abscess on ?buttock area, and it's an open wound. Caller ?explained she is also diabetic it's still bleeding and ?needs advise on how to care for the open wound, ?(office transferred call) ?Translation No ?Nurse Assessment ?Nurse: Velta Addison, RN, Crystal Date/Time (Eastern Time): 06/08/2021 12:48:19 PM ?Confirm and document reason for call. If ?symptomatic, describe symptoms. ?---Caller states she is being treated for abscess on ?buttock area, and it's an open wound. Caller explained ?she is also diabetic it's still bleeding and needs advise ?on how to care for the open wound, (office transferred ?call) Not oozing much. Was going to have wound ?care but was cancelled (Tabitha at [place] office was ?scheduling that for her). States Surgeon saw her on ?Tuesday, opened the wound in the office that day and ?states they cut to make the two holes into one. No ?fever. ?Does the patient have any new or worsening ?symptoms? ---Yes ?Will a triage be completed? ---Yes ?Related visit to physician within the last 2 weeks? ---Yes ?Does the PT have any chronic conditions? (i.e. ?diabetes, asthma, this includes High risk factors  for ?pregnancy, etc.) ?---Yes ?List chronic conditions. ---DM, Mild Asthma ?Is this a behavioral health or substance abuse call? ---No ?PLEASE NOTE: All timestamps contained within this report are represented as [place] Standard Time. ?CONFIDENTIALTY NOTICE: This fax transmission is intended only for the addressee. It contains information that is legally privileged, confidential or ?otherwise protected from use or disclosure. If you are not the intended recipient, you are strictly prohibited from reviewing, disclosing, copying using ?or disseminating any of this information or taking any action in reliance on or regarding this information. If you have received this fax in error, please ?notify us immediately by telephone so that we can arrange for its return to [place]. Phone: [phone], Toll-Free: [phone], Fax: [phone] ?Page: 2 of 2 ?Call Id: [id number] ?Guidelines ?Guideline Title Affirmed Question Affirmed Notes Nurse Date/Time (Eastern ?Time) ?Wound Infection Wound doesn't sound ?infected ?Parrott, RN, Crystal 06/08/2021 12:51:32 ?PM ?Disp. Time (Eastern ?Time) Disposition Final User ?06/08/2021 12:55:13 PM Home Care Yes Parrott, RN, Crystal ?Caller Disagree/Comply Comply ?Caller Understands Yes ?PreDisposition Call Doctor ?Care Advice Given Per Guideline ?HOME CARE: * You should be able to treat this at home. CARE ADVICE per Wound Infection (Adult) guideline. CALL BACK ?IF: * You become worse ?Comments ?User: Hamilton Capri, RN Date/Time Eilene Ghazi Time): 06/08/2021 12:53:38 PM ?States she is unable to apply dressing in that area, packing came out with her shower when her original dressing ?came off ?

## 2021-06-09 ENCOUNTER — Ambulatory Visit: Payer: Medicare PPO | Admitting: Family

## 2021-06-09 ENCOUNTER — Encounter: Payer: Self-pay | Admitting: Family

## 2021-06-09 VITALS — BP 98/72 | HR 79 | Temp 98.3°F | Resp 16 | Ht 64.0 in | Wt 287.2 lb

## 2021-06-09 DIAGNOSIS — T148XXA Other injury of unspecified body region, initial encounter: Secondary | ICD-10-CM | POA: Diagnosis not present

## 2021-06-09 DIAGNOSIS — L0231 Cutaneous abscess of buttock: Secondary | ICD-10-CM | POA: Diagnosis not present

## 2021-06-09 NOTE — Telephone Encounter (Signed)
Saw patient already, thank you for speaking with the patient.  Please see note for further information.  

## 2021-06-09 NOTE — Telephone Encounter (Signed)
Pt called back and she spoke with surgical nurse and was advised that the surgeon would not do [place] for pt; surgeon is out of office today. Pt said she was advised to sit in tub of warm water and if pt cannot do that put a warm moist cloth on area and dress with gauze and tape. Pt said she advised the surgical nurse multiple times that she cannot see the area and cannot apply a dressing and does not have anyone to help her at home. Pt wants to know if could get HH order from [facility]. Pt does not know if has fever due to thermometer not working. Pt wanted to know if could see Dr Glori Bickers or Eugenia Pancoast FNP; Dr Glori Bickers does not have available appt today and pt scheduled in office appt with Pollyann Glen FNP 06/09/21 at 12:20. Sending note to T Dugal FNP and Claiborne Billings CMA.  ?

## 2021-06-09 NOTE — Assessment & Plan Note (Signed)
Monitor daily for worsening s/s infection.  ?

## 2021-06-09 NOTE — Progress Notes (Signed)
? ?Established Patient Office Visit ? ?Subjective:  ?Patient ID: Mackenzie Bonilla, female    DOB: August 18, 1965  Age: 56 y.o. MRN: [id number] ? ?CC:  ?Chief Complaint  ?Patient presents with  ? Wound Check  ? ? ?HPI ?Mackenzie Bonilla is here today for follow up.  ?Pt is without acute concerns. ? ?Recently went to general surgeon - probed largest of the hold to determine there was no particular tunneling suggestive of an anal fistula. Surgeon completed the opening by connecting the two open wounds to allow for drainage.  ? ?Putting clean tower on bench in shower, sits when time to clean her body, loofah on body but having a hard time cleaning her wound in her lower buttock areas.  ?She is unable to visualize her wound. She states hard for her to see back there. She has called the general surgeon, and she states unable to see it back there.  ? ?Past Medical History:  ?Diagnosis Date  ? Allergy   ? allergic rhinitis  ? Anemia   ? Anxiety   ? Arthritis   ? osteoarthritis  ? Asthma   ? Claustrophobia   ? does not like oxygen mask covering face  ? Depression   ? Diabetes mellitus without complication ([place])   ? Fall 2016  ? Fatty liver 07/2008  ? abd. ultrasound - fatty liver ; slt dilated cbd (no stones ) 06/10// abd.  ultrasound normal on 04/2006  ? Fibromyalgia   ? GERD (gastroesophageal reflux disease)   ? EGD negative 06/2001// EGD erythematous mucosa, polyp 08/2008  ? High uric acid in 24 hour urine specimen   ? Hyperhidrosis   ? Hyperlipidemia   ? Kidney stones   ? Lymphedema   ? Obesity   ? PCOS (polycystic ovarian syndrome)   ? Shortness of breath dyspnea   ? Tachycardia   ? TMJ (dislocation of temporomandibular joint)   ? UTI (lower urinary tract infection)   ? ? ?Past Surgical History:  ?Procedure Laterality Date  ? BREAST BIOPSY Right 2016  ? neg  ? BREAST LUMPECTOMY WITH RADIOACTIVE SEED LOCALIZATION Right 08/02/2014  ? Procedure: BREAST LUMPECTOMY WITH RADIOACTIVE SEED LOCALIZATION;  Surgeon: Rolm Bookbinder, MD;  Location: [facility];  Service: General;  Laterality: Right;  ? COLONOSCOPY  08/12/2012  ? Completed by Dr. Phylis Bougie, normal exam.   ? ENDOMETRIAL BIOPSY  01/2004  ? ESOPHAGOGASTRODUODENOSCOPY    ? ESOPHAGOGASTRODUODENOSCOPY (EGD) WITH PROPOFOL N/A 08/18/2019  ? Procedure: ESOPHAGOGASTRODUODENOSCOPY (EGD) WITH PROPOFOL;  Surgeon: Lucilla Lame, MD;  Location[facility] ENDOSCOPY;  Service: Endoscopy;  Laterality: N/A;  ? FOOT SURGERY    ? SEPTOPLASTY    ? two times  ? TONSILLECTOMY    ? UTERINE FIBROID SURGERY  06/2005  ? ablation  ? uterine tumor  09/2001  ? VAGUS NERVE STIMULATOR INSERTION    ? ? ?Family History  ?Problem Relation Age of Onset  ? Hypertension Father   ? Cancer Father   ?     pancreatic cancer  ? Hypertension Sister   ? Heart disease Maternal Grandmother 60  ?     MI  ? Diabetes Maternal Grandmother   ? Heart disease Paternal Grandmother 56  ?     MI  ? Diabetes Paternal Grandmother   ? Breast cancer Neg Hx   ? ? ?Social History  ? ?Socioeconomic History  ? Marital status: Single  ?  Spouse name: Not on file  ? Number of children: Not on file  ?  Years of education: Not on file  ? Highest education level: Not on file  ?Occupational History  ? Not on file  ?Tobacco Use  ? Smoking status: Never  ? Smokeless tobacco: Never  ?Vaping Use  ? Vaping Use: Never used  ?Substance and Sexual Activity  ? Alcohol use: No  ?  Alcohol/week: 0.0 standard drinks  ? Drug use: No  ? Sexual activity: Not Currently  ?  Birth control/protection: None, Post-menopausal  ?Other Topics Concern  ? Not on file  ?Social History Narrative  ? Not on file  ? ?Social Determinants of Health  ? ?Financial Resource Strain: Not on file  ?Food Insecurity: Not on file  ?Transportation Needs: Not on file  ?Physical Activity: Not on file  ?Stress: Not on file  ?Social Connections: Not on file  ?Intimate Partner Violence: Not on file  ? ? ?Outpatient Medications Prior to Visit  ?Medication Sig Dispense Refill  ? acetaminophen (TYLENOL) 500  MG tablet Take 500 mg by mouth every 6 (six) hours as needed.     ? albuterol (VENTOLIN HFA) 108 (90 Base) MCG/ACT inhaler Inhale 2 puffs into the lungs every 6 (six) hours as needed for wheezing. 18 g 3  ? ALPRAZolam (XANAX) 1 MG tablet Take 2 mg by mouth 3 (three) times daily as needed.    ? Blood Glucose Monitoring Suppl (FIFTY50 GLUCOSE METER 2.0) w/Device KIT Use as directed    ? buPROPion (WELLBUTRIN XL) 150 MG 24 hr tablet Take 150 mg by mouth daily.    ? Cholecalciferol (VITAMIN D3 PO) Take 2,000 Units by mouth daily.    ? clindamycin (CLEOCIN-T) 1 % lotion Apply topically daily. Qd to bumps on face and chest 60 mL 6  ? cyclobenzaprine (FLEXERIL) 5 MG tablet Take 1 tablet (5 mg total) by mouth 3 (three) times daily as needed for muscle spasms. 90 tablet 2  ? desvenlafaxine (PRISTIQ) 100 MG 24 hr tablet     ? dimenhyDRINATE (DRAMAMINE) 50 MG tablet Take 50 mg by mouth every 8 (eight) hours as needed.    ? doxycycline (VIBRAMYCIN) 100 MG capsule Take 1 capsule (100 mg total) by mouth 2 (two) times daily. 20 capsule 0  ? Eflornithine HCl (VANIQA) 13.9 % cream Apply 1 application topically 2 (two) times daily with a meal. Bid to aa hair growth face and neck 45 g 6  ? fluticasone (FLONASE) 50 MCG/ACT nasal spray Place into both nostrils daily.    ? gabapentin (NEURONTIN) 100 MG capsule TAKE 2 CAPSULES BY MOUTH EVERY MORNING AND 3 CAPSULES EVERY EVENING 150 capsule 0  ? gabapentin (NEURONTIN) 300 MG capsule TAKE 1 CAPSULE AT BEDTIME 90 capsule 3  ? ibuprofen (ADVIL) 600 MG tablet Take 1 tablet (600 mg total) by mouth every 8 (eight) hours as needed. 30 tablet 0  ? Insulin Degludec (TRESIBA [place]) Inject into the skin. 1 injection nightly 8-10 pm    ? ketoconazole (NIZORAL) 2 % cream Apply 1 application topically daily. 60 g 2  ? losartan (COZAAR) 25 MG tablet     ? nystatin cream (MYCOSTATIN) APPLY TO AFFECTED AREAS 3 TIMES DAILY ASNEEDED 30 g 3  ? omeprazole (PRILOSEC) 20 MG capsule Take 1 capsule (20 mg total) by  mouth 2 (two) times daily. 180 capsule 3  ? oxybutynin (DITROPAN XL) 15 MG 24 hr tablet TAKE 1 TABLET BY MOUTH DAILY 90 tablet 1  ? Polyethyl Glycol-Propyl Glycol (SYSTANE OP) Apply 1 drop to eye daily as needed.    ?  pravastatin (PRAVACHOL) 20 MG tablet 1 TABLET BY MOUTH AT BEDTIME. 90 tablet 3  ? Simethicone (GAS-X PO) Take by mouth as needed.    ? spironolactone (ALDACTONE) 100 MG tablet TAKE 1 TABLET BY MOUTH DAILY. 90 tablet 0  ? tirzepatide (MOUNJARO) 7.5 MG/0.5ML Pen Inject 7.5 mg into the skin once a week.    ? traMADol (ULTRAM) 50 MG tablet TAKE ONE TABLET TWICE A DAY IF NEEDED FOR SEVERE PAIN 30 tablet 0  ? traZODone (DESYREL) 150 MG tablet Take 300 mg by mouth at bedtime.     ? zinc oxide (BALMEX) 11.3 % CREA cream Apply 1 application topically 2 (two) times daily.    ? buPROPion (ZYBAN) 150 MG 12 hr tablet Take 150 mg by mouth 2 (two) times daily. (Patient not taking: Reported on 06/09/2021)    ? cyanocobalamin (,VITAMIN B-12,) 1000 MCG/ML injection Inject 1 mL (1,000 mcg total) into the muscle every 6 (six) weeks. (Patient not taking: Reported on 06/09/2021) 1 mL 9  ? ?No facility-administered medications prior to visit.  ? ? ?Allergies  ?Allergen Reactions  ? Cephalexin   ? Codeine   ?  rash  ? Duloxetine   ? Levaquin [Levofloxacin] Nausea Only  ? Lisinopril Cough  ? Metformin And Related Diarrhea  ?  GI side effects   ? Quetiapine Other (See Comments)  ? Sertraline Hcl   ?  REACTION: vivid dreams  ? Triazolam   ?  REACTION: ? reaction  ? Zaleplon   ? Zocor [Simvastatin - High Dose]   ?  achey  ? Zolpidem Tartrate   ?  hallucinations  ? Hydrocodone Itching and Rash  ?  REACTION: redness rash itching  ? Norelgestromin-Eth Estradiol   ?  Patch didn't stick to skin  ? ? ?ROS ?Review of Systems  ?Constitutional:  Negative for chills, fatigue and fever.  ?Gastrointestinal:  Positive for constipation (mixed) and diarrhea (mixed). Negative for abdominal pain, rectal pain and vomiting.  ?Skin:  Positive for  wound (open healing wound).  ? ? ? ?  ?Objective:  ?  ?Physical Exam ?Exam conducted with a chaperone present.  ?Constitutional:   ?   General: She is not in acute distress. ?   Appearance: Normal appearance. She

## 2021-06-09 NOTE — Assessment & Plan Note (Signed)
visualized wound in office, today as well as looked over notes from general surgery visit where he connected two tunneled wounds to allow for drainage. Free from drainage. Healing nicely, pink skin at site. Did clean buttocks with residual fecal matter with saline as well as wet pads. Advised pt to use desitin white cream otc to crack of buttocks to avoid breakdown of skin.  ? ?Advised pt to attempt to irrigate 2-3 times with shower hand held head.  ?Advised pt to look into bidet for toilet to help her to wipe as hard to reach the area in the back ?Keep attention best able to monitoring for worsening drainage, redness, irritation or pain. If any fever/chills need more urgent care. F/u in three days for reassessment ?

## 2021-06-09 NOTE — Patient Instructions (Signed)
Recommend applying desitin butt paste as needed (with zinc) I like the one with white cream.  ?Also keep area as dry as able.  ?Wash with irrigating with shower head as able at least twice if not three times a day.  ?Look up bidet for your toilet, use wet wipes unscented for bowel movements.  ? ?For constipation -take over the counter stool softener and fiber tablet daily.  ?Miralax as needed for movement of the bowel.  ? ?Due to recent changes in healthcare laws, you may see results of your imaging and/or laboratory studies on MyChart before I have had a chance to review them.  I understand that in some cases there may be results that are confusing or concerning to you. Please understand that not all results are received at the same time and often I may need to interpret multiple results in order to provide you with the best plan of care or course of treatment. Therefore, I ask that you please give me 2 business days to thoroughly review all your results before contacting my office for clarification. Should we see a critical lab result, you will be contacted sooner.  ? ?It was a pleasure seeing you today! Please do not hesitate to reach out with any questions and or concerns. ? ?Regards,  ? ?Ronna Herskowitz ?FNP-C ? ?

## 2021-06-12 ENCOUNTER — Encounter: Payer: Self-pay | Admitting: Family

## 2021-06-12 ENCOUNTER — Ambulatory Visit (INDEPENDENT_AMBULATORY_CARE_PROVIDER_SITE_OTHER)
Admission: RE | Admit: 2021-06-12 | Discharge: 2021-06-12 | Disposition: A | Discharge status: Home / Self Care | Payer: Medicare PPO | Source: Ambulatory Visit | Attending: Family | Admitting: Family

## 2021-06-12 ENCOUNTER — Ambulatory Visit: Payer: Medicare PPO | Admitting: Dermatology

## 2021-06-12 ENCOUNTER — Ambulatory Visit: Payer: Medicare PPO | Admitting: Family

## 2021-06-12 VITALS — BP 104/70 | HR 80 | Temp 98.0°F | Resp 16 | Ht 64.0 in | Wt 286.3 lb

## 2021-06-12 DIAGNOSIS — K5900 Constipation, unspecified: Secondary | ICD-10-CM

## 2021-06-12 DIAGNOSIS — L853 Xerosis cutis: Secondary | ICD-10-CM | POA: Diagnosis not present

## 2021-06-12 DIAGNOSIS — L820 Inflamed seborrheic keratosis: Secondary | ICD-10-CM | POA: Diagnosis not present

## 2021-06-12 DIAGNOSIS — R1084 Generalized abdominal pain: Secondary | ICD-10-CM

## 2021-06-12 DIAGNOSIS — R350 Frequency of micturition: Secondary | ICD-10-CM | POA: Diagnosis not present

## 2021-06-12 DIAGNOSIS — L0231 Cutaneous abscess of buttock: Secondary | ICD-10-CM

## 2021-06-12 DIAGNOSIS — R109 Unspecified abdominal pain: Secondary | ICD-10-CM | POA: Diagnosis not present

## 2021-06-12 MED ORDER — AMMONIUM LACTATE 12 % EX CREA: 1.0000 "application " | TOPICAL_CREAM | Freq: Two times a day (BID) | CUTANEOUS | 2 refills | Status: DC

## 2021-06-12 MED ORDER — MOMETASONE FUROATE 0.1 % EX CREA: TOPICAL_CREAM | CUTANEOUS | 1 refills | Status: DC

## 2021-06-12 NOTE — Patient Instructions (Addendum)
Complete xray(s) prior to leaving today. I will notify you of your results once received. ? ?Leave a urine sample today to test for UTI.  ? ? ? ?Due to recent changes in healthcare laws, you may see results of your imaging and/or laboratory studies on MyChart before I have had a chance to review them.  I understand that in some cases there may be results that are confusing or concerning to you. Please understand that not all results are received at the same time and often I may need to interpret multiple results in order to provide you with the best plan of care or course of treatment. Therefore, I ask that you please give me 2 business days to thoroughly review all your results before contacting my office for clarification. Should we see a critical lab result, you will be contacted sooner.  ? ?It was a pleasure seeing you today! Please do not hesitate to reach out with any questions and or concerns. ? ?Regards,  ? ?Providencia Hottenstein ?FNP-C ? ? ? ?

## 2021-06-12 NOTE — Progress Notes (Signed)
? ?Follow-Up Visit ?  ?Subjective  ?Mackenzie Bonilla is a 56 y.o. female who presents for the following: Follow-up (Patient here today for ISK follow up. Patient had a spot at left jaw treated at last visit with LN2 but has not cleared. She does have a few other spots that are irritating. Patient would also like to know what she can use for extremely dry skin. ). ? ?Patient also using clindamycin lotion for folliculitis at chest and neck.  ? ?The following portions of the chart were reviewed this encounter and updated as appropriate:  ?  ?  ? ?Review of Systems:  No other skin or systemic complaints except as noted in HPI or Assessment and Plan. ? ?Objective  ?Well appearing patient in no apparent distress; mood and affect are within normal limits. ? ?A focused examination was performed including face, neck and chest. Relevant physical exam findings are noted in the Assessment and Plan. ? ?left jaw x 3 ?Firm keratotic papules, some waxy on chest, neck, jaw ? ?bilateral arms, legs, feet ?Severe xerosis ? ? ? ? ?Assessment & Plan  ?Inflamed seborrheic keratosis ?left jaw x 3 ? ?Vs Prurigo Nodules ? ?Start mometasone cream to chest 1-2 times daily to thickened bumps at chest and neck as needed. Avoid applying to face, groin, and axilla. Use as directed. Long-term use can cause thinning of the skin. ? ?Topical steroids (such as triamcinolone, fluocinolone, fluocinonide, mometasone, clobetasol, halobetasol, betamethasone, hydrocortisone) can cause thinning and lightening of the skin if they are used for too long in the same area. Your physician has selected the right strength medicine for your problem and area affected on the body. Please use your medication only as directed by your physician to prevent side effects.  ? ? ?Destruction of lesion - left jaw x 3 ? ?Destruction method: cryotherapy   ?Informed consent: discussed and consent obtained   ?Lesion destroyed using liquid nitrogen: Yes   ?Region frozen until ice  ball extended beyond lesion: Yes   ?Outcome: patient tolerated procedure well with no complications   ?Post-procedure details: wound care instructions given   ?Additional details:  Prior to procedure, discussed risks of blister formation, small wound, skin dyspigmentation, or rare scar following cryotherapy. Recommend Vaseline ointment to treated areas while healing.  ? ?mometasone (ELOCON) 0.1 % cream - left jaw x 3 ?Apply 1-2 times daily as needed to thickened bumps at neck and chest. Avoid applying to face, groin, and axilla. Use as directed. Long-term use can cause thinning of the skin. ? ?Xerosis cutis ?bilateral arms, legs, feet ? ?Recommend mild soap and moisturizing cream 1-2 times daily.  Gentle skin care handout provided.   ? ?Start Ammonium lactate 12% cream bid to dry skin ? ?Recommend starting moisturizer with exfoliant (Urea, Salicylic acid, or Lactic acid) one to two times daily to help smooth rough and bumpy skin.  OTC options include Cetaphil Rough and Bumpy lotion (Urea), Eucerin Roughness Relief lotion or spot treatment cream (Urea), CeraVe SA lotion/cream for Rough and Bumpy skin (Sal Acid), Gold Bond Rough and Bumpy cream (Sal Acid), and AmLactin 12% lotion/cream (Lactic Acid).  If applying in morning, also apply sunscreen to sun-exposed areas, since these exfoliating moisturizers can increase sensitivity to sun. ? ? ?ammonium lactate (AMLACTIN) 12 % cream - bilateral arms, legs, feet ?Apply 1 application. topically in the morning and at bedtime. For dry skin ? ? ?Return in about 2 months (around 08/12/2021) for ISK follow up. ? ?Graciella Belton, RMA,  am acting as scribe for Brendolyn Patty, MD . ? ?Documentation: I have reviewed the above documentation for accuracy and completeness, and I agree with the above. ? ?Brendolyn Patty MD  ?

## 2021-06-12 NOTE — Progress Notes (Signed)
? ?Established Patient Office Visit ? ?Subjective:  ?Patient ID: Mackenzie Bonilla, female    DOB: 06-19-1965  Age: 56 y.o. MRN: [id number] ? ?CC:  ?Chief Complaint  ?Patient presents with  ? Wound Check  ? ? ?HPI ?Mackenzie Bonilla is here today for follow up.  ?Pt is without acute concerns. ? ?Having constipation. Taking stool softener daily.  ?Does have stomach pain and nausea, generalized.  ?Not throwing up. Ate two plain waffles.  ?Still unable to visualized her healing wound on her left buttock but doesn't feel like any change, no increase in tenderness. No drainage.  ? ?Drinking 1-2 waters a day.  ?Does have increased urinary frequency with some urgency. Pee feels really warm.  ?No dysuria. No fever no chills.  ? ?Finished up last pill of doxycycline 100 mg po bid course today.  ?Has had pellet sized bowel movements for about three weeks, no full bowel movement in that time. Small pellet came out yesterday. ? ?Past Medical History:  ?Diagnosis Date  ? Allergy   ? allergic rhinitis  ? Anemia   ? Anxiety   ? Arthritis   ? osteoarthritis  ? Asthma   ? Claustrophobia   ? does not like oxygen mask covering face  ? Depression   ? Diabetes mellitus without complication ([place])   ? Fall 2016  ? Fatty liver 07/2008  ? abd. ultrasound - fatty liver ; slt dilated cbd (no stones ) 06/10// abd.  ultrasound normal on 04/2006  ? Fibromyalgia   ? GERD (gastroesophageal reflux disease)   ? EGD negative 06/2001// EGD erythematous mucosa, polyp 08/2008  ? High uric acid in 24 hour urine specimen   ? Hyperhidrosis   ? Hyperlipidemia   ? Kidney stones   ? Lymphedema   ? Obesity   ? PCOS (polycystic ovarian syndrome)   ? Shortness of breath dyspnea   ? Tachycardia   ? TMJ (dislocation of temporomandibular joint)   ? UTI (lower urinary tract infection)   ? ? ?Past Surgical History:  ?Procedure Laterality Date  ? BREAST BIOPSY Right 2016  ? neg  ? BREAST LUMPECTOMY WITH RADIOACTIVE SEED LOCALIZATION Right 08/02/2014  ? Procedure:  BREAST LUMPECTOMY WITH RADIOACTIVE SEED LOCALIZATION;  Surgeon: Rolm Bookbinder, MD;  Location: [facility];  Service: General;  Laterality: Right;  ? COLONOSCOPY  08/12/2012  ? Completed by Dr. Phylis Bougie, normal exam.   ? ENDOMETRIAL BIOPSY  01/2004  ? ESOPHAGOGASTRODUODENOSCOPY    ? ESOPHAGOGASTRODUODENOSCOPY (EGD) WITH PROPOFOL N/A 08/18/2019  ? Procedure: ESOPHAGOGASTRODUODENOSCOPY (EGD) WITH PROPOFOL;  Surgeon: Lucilla Lame, MD;  Location: [facility] ENDOSCOPY;  Service: Endoscopy;  Laterality: N/A;  ? FOOT SURGERY    ? SEPTOPLASTY    ? two times  ? TONSILLECTOMY    ? UTERINE FIBROID SURGERY  06/2005  ? ablation  ? uterine tumor  09/2001  ? VAGUS NERVE STIMULATOR INSERTION    ? ? ?Family History  ?Problem Relation Age of Onset  ? Hypertension Father   ? Cancer Father   ?     pancreatic cancer  ? Hypertension Sister   ? Heart disease Maternal Grandmother 60  ?     MI  ? Diabetes Maternal Grandmother   ? Heart disease Paternal Grandmother 19  ?     MI  ? Diabetes Paternal Grandmother   ? Breast cancer Neg Hx   ? ? ?Social History  ? ?Socioeconomic History  ? Marital status: Single  ?  Spouse name: Not on file  ?  Number of children: Not on file  ? Years of education: Not on file  ? Highest education level: Not on file  ?Occupational History  ? Not on file  ?Tobacco Use  ? Smoking status: Never  ? Smokeless tobacco: Never  ?Vaping Use  ? Vaping Use: Never used  ?Substance and Sexual Activity  ? Alcohol use: No  ?  Alcohol/week: 0.0 standard drinks  ? Drug use: No  ? Sexual activity: Not Currently  ?  Birth control/protection: None, Post-menopausal  ?Other Topics Concern  ? Not on file  ?Social History Narrative  ? Not on file  ? ?Social Determinants of Health  ? ?Financial Resource Strain: Not on file  ?Food Insecurity: Not on file  ?Transportation Needs: Not on file  ?Physical Activity: Not on file  ?Stress: Not on file  ?Social Connections: Not on file  ?Intimate Partner Violence: Not on file  ? ? ?Outpatient Medications Prior  to Visit  ?Medication Sig Dispense Refill  ? acetaminophen (TYLENOL) 500 MG tablet Take 500 mg by mouth every 6 (six) hours as needed.     ? albuterol (VENTOLIN HFA) 108 (90 Base) MCG/ACT inhaler Inhale 2 puffs into the lungs every 6 (six) hours as needed for wheezing. 18 g 3  ? ALPRAZolam (XANAX) 1 MG tablet Take 2 mg by mouth 3 (three) times daily as needed.    ? Blood Glucose Monitoring Suppl (FIFTY50 GLUCOSE METER 2.0) w/Device KIT Use as directed    ? buPROPion (WELLBUTRIN XL) 150 MG 24 hr tablet Take 150 mg by mouth daily.    ? Cholecalciferol (VITAMIN D3 PO) Take 2,000 Units by mouth daily.    ? clindamycin (CLEOCIN-T) 1 % lotion Apply topically daily. Qd to bumps on face and chest 60 mL 6  ? cyclobenzaprine (FLEXERIL) 5 MG tablet Take 1 tablet (5 mg total) by mouth 3 (three) times daily as needed for muscle spasms. 90 tablet 2  ? desvenlafaxine (PRISTIQ) 100 MG 24 hr tablet     ? dimenhyDRINATE (DRAMAMINE) 50 MG tablet Take 50 mg by mouth every 8 (eight) hours as needed.    ? Eflornithine HCl (VANIQA) 13.9 % cream Apply 1 application topically 2 (two) times daily with a meal. Bid to aa hair growth face and neck 45 g 6  ? fluticasone (FLONASE) 50 MCG/ACT nasal spray Place into both nostrils daily.    ? gabapentin (NEURONTIN) 100 MG capsule TAKE 2 CAPSULES BY MOUTH EVERY MORNING AND 3 CAPSULES EVERY EVENING 150 capsule 0  ? ibuprofen (ADVIL) 600 MG tablet Take 1 tablet (600 mg total) by mouth every 8 (eight) hours as needed. 30 tablet 0  ? Insulin Degludec (TRESIBA [place]) Inject into the skin. 1 injection nightly 8-10 pm    ? ketoconazole (NIZORAL) 2 % cream Apply 1 application topically daily. 60 g 2  ? losartan (COZAAR) 25 MG tablet     ? nystatin cream (MYCOSTATIN) APPLY TO AFFECTED AREAS 3 TIMES DAILY ASNEEDED 30 g 3  ? omeprazole (PRILOSEC) 20 MG capsule Take 1 capsule (20 mg total) by mouth 2 (two) times daily. 180 capsule 3  ? oxybutynin (DITROPAN XL) 15 MG 24 hr tablet TAKE 1 TABLET BY MOUTH DAILY 90  tablet 1  ? Polyethyl Glycol-Propyl Glycol (SYSTANE OP) Apply 1 drop to eye daily as needed.    ? pravastatin (PRAVACHOL) 20 MG tablet 1 TABLET BY MOUTH AT BEDTIME. 90 tablet 3  ? Simethicone (GAS-X PO) Take by mouth as needed.    ?  spironolactone (ALDACTONE) 100 MG tablet TAKE 1 TABLET BY MOUTH DAILY. 90 tablet 0  ? tirzepatide (MOUNJARO) 7.5 MG/0.5ML Pen Inject 7.5 mg into the skin once a week.    ? traMADol (ULTRAM) 50 MG tablet TAKE ONE TABLET TWICE A DAY IF NEEDED FOR SEVERE PAIN 30 tablet 0  ? traZODone (DESYREL) 150 MG tablet Take 300 mg by mouth at bedtime.     ? zinc oxide (BALMEX) 11.3 % CREA cream Apply 1 application topically 2 (two) times daily.    ? gabapentin (NEURONTIN) 300 MG capsule TAKE 1 CAPSULE AT BEDTIME 90 capsule 3  ? buPROPion (ZYBAN) 150 MG 12 hr tablet Take 150 mg by mouth 2 (two) times daily. (Patient not taking: Reported on 06/09/2021)    ? cyanocobalamin (,VITAMIN B-12,) 1000 MCG/ML injection Inject 1 mL (1,000 mcg total) into the muscle every 6 (six) weeks. (Patient not taking: Reported on 06/09/2021) 1 mL 9  ? doxycycline (VIBRAMYCIN) 100 MG capsule Take 1 capsule (100 mg total) by mouth 2 (two) times daily. (Patient not taking: Reported on 06/12/2021) 20 capsule 0  ? ?No facility-administered medications prior to visit.  ? ? ?Allergies  ?Allergen Reactions  ? Cephalexin   ? Codeine   ?  rash  ? Duloxetine   ? Levaquin [Levofloxacin] Nausea Only  ? Lisinopril Cough  ? Metformin And Related Diarrhea  ?  GI side effects   ? Quetiapine Other (See Comments)  ? Sertraline Hcl   ?  REACTION: vivid dreams  ? Triazolam   ?  REACTION: ? reaction  ? Zaleplon   ? Zocor [Simvastatin - High Dose]   ?  achey  ? Zolpidem Tartrate   ?  hallucinations  ? Hydrocodone Itching and Rash  ?  REACTION: redness rash itching  ? Norelgestromin-Eth Estradiol   ?  Patch didn't stick to skin  ? ? ?ROS ?Review of Systems  ?Constitutional:  Negative for chills, fatigue, fever and unexpected weight change.  ?Eyes:   Negative for visual disturbance.  ?Respiratory:  Negative for shortness of breath.   ?Cardiovascular:  Negative for chest pain.  ?Gastrointestinal:  Positive for abdominal pain (generalized), constipation and na

## 2021-06-12 NOTE — Patient Instructions (Addendum)
Start mometasone cream to chest 1-2 times daily to thickened bumps at chest and neck as needed. Avoid applying to face, groin, and axilla. Use as directed. Long-term use can cause thinning of the skin. ? ?Topical steroids (such as triamcinolone, fluocinolone, fluocinonide, mometasone, clobetasol, halobetasol, betamethasone, hydrocortisone) can cause thinning and lightening of the skin if they are used for too long in the same area. Your physician has selected the right strength medicine for your problem and area affected on the body. Please use your medication only as directed by your physician to prevent side effects.  ? ?Recommend starting moisturizer with exfoliant (Urea, Salicylic acid, or Lactic acid) one to two times daily to help smooth rough and bumpy skin.  OTC options include Cetaphil Rough and Bumpy lotion (Urea), Eucerin Roughness Relief lotion or spot treatment cream (Urea), CeraVe SA lotion/cream for Rough and Bumpy skin (Sal Acid), Gold Bond Rough and Bumpy cream (Sal Acid), and AmLactin 12% lotion/cream (Lactic Acid).  If applying in morning, also apply sunscreen to sun-exposed areas, since these exfoliating moisturizers can increase sensitivity to sun. ? ?If You Need Anything After Your Visit ? ?If you have any questions or concerns for your doctor, please call our main line at [phone] and press option 4 to reach your doctor's medical assistant. If no one answers, please leave a voicemail as directed and we will return your call as soon as possible. Messages left after 4 pm will be answered the following business day.  ? ?You may also send [place] a message via MyChart. We typically respond to MyChart messages within 1-2 business days. ? ?For prescription refills, please ask your pharmacy to contact our office. Our fax number is [phone]. ? ?If you have an urgent issue when the clinic is closed that cannot wait until the next business day, you can page your doctor at the number below.    ? ?Please note that while we do our best to be available for urgent issues outside of office hours, we are not available 24/7.  ? ?If you have an urgent issue and are unable to reach [place], you may choose to seek medical care at your doctor's office, retail clinic, urgent care center, or emergency room. ? ?If you have a medical emergency, please immediately call 911 or go to the emergency department. ? ?Pager Numbers ? ?- Dr. Nehemiah Massed: [phone] ? ?- Dr. Laurence Ferrari: [phone] ? ?- Dr. Nicole Kindred: [phone] ? ?In the event of inclement weather, please call our main line at [phone] for an update on the status of any delays or closures. ? ?Dermatology Medication Tips: ?Please keep the boxes that topical medications come in in order to help keep track of the instructions about where and how to use these. Pharmacies typically print the medication instructions only on the boxes and not directly on the medication tubes.  ? ?If your medication is too expensive, please contact our office at [phone] option 4 or send [place] a message through [place].  ? ?We are unable to tell what your co-pay for medications will be in advance as this is different depending on your insurance coverage. However, we may be able to find a substitute medication at lower cost or fill out paperwork to get insurance to cover a needed medication.  ? ?If a prior authorization is required to get your medication covered by your insurance company, please allow [place] 1-2 business days to complete this process. ? ?Drug prices often vary depending on where the prescription is filled and  some pharmacies may offer cheaper prices. ? ?The website www.goodrx.com contains coupons for medications through different pharmacies. The prices here do not account for what the cost may be with help from insurance (it may be cheaper with your insurance), but the website can give you the price if you did not use any insurance.  ?- You can print the associated coupon and take  it with your prescription to the pharmacy.  ?- You may also stop by our office during regular business hours and pick up a GoodRx coupon card.  ?- If you need your prescription sent electronically to a different pharmacy, notify our office through [facility] or by phone at [phone] option 4. ? ? ? ? ?Si Usted Necesita Algo Despu?s de Su Visita ? ?Tambi?n puede enviarnos un mensaje a trav?s de MyChart. Por lo general respondemos a los mensajes de MyChart en el transcurso de 1 a 2 d?as h?biles. ? ?Para renovar recetas, por favor pida a su farmacia que se ponga en contacto con nuestra oficina. Nuestro n?mero de fax es el [phone]. ? ?Si tiene un asunto urgente cuando la cl?nica est? cerrada y que no puede esperar hasta el siguiente d?a h?bil, puede llamar/localizar a su doctor(a) al n?mero que aparece a continuaci?n.  ? ?Por favor, tenga en cuenta que aunque hacemos todo lo posible para estar disponibles para asuntos urgentes fuera del horario de oficina, no estamos disponibles las 24 horas del d?a, los 7 d?as de la semana.  ? ?Si tiene un problema urgente y no puede comunicarse con nosotros, puede optar por buscar atenci?n m?dica  en el consultorio de su doctor(a), en una cl?nica privada, en un centro de atenci?n urgente o en una sala de emergencias. ? ?Si tiene Engineer, maintenance (IT) m?dica, por favor llame inmediatamente al 911 o vaya a la sala de emergencias. ? ?N?meros de b?per ? ?- Dr. Nehemiah Massed: [phone] ? ?- Dra. Moye: [phone] ? ?- Dra. Nicole Kindred: [phone] ? ?En caso de inclemencias del tiempo, por favor llame a nuestra l?nea principal al [phone] para una actualizaci?n sobre el estado de cualquier retraso o cierre. ? ?Consejos para la medicaci?n en dermatolog?a: ?Por favor, guarde las cajas en las que vienen los medicamentos de uso t?pico para ayudarle a seguir las instrucciones sobre d?nde y c?mo usarlos. Las farmacias generalmente imprimen las instrucciones del medicamento s?lo en las  cajas y no directamente en los tubos del [place].  ? ?Si su medicamento es muy caro, por favor, p?ngase en contacto con Zigmund Daniel llamando al [phone] y presione la opci?n 4 o env?enos un mensaje a trav?s de MyChart.  ? ?No podemos decirle cu?l ser? su copago por los medicamentos por adelantado ya que esto es diferente dependiendo de la cobertura de su seguro. Sin embargo, es posible que podamos encontrar un medicamento sustituto a Electrical engineer un formulario para que el seguro cubra el medicamento que se considera necesario.  ? ?Si se requiere Ardelia Mems autorizaci?n previa para que su compa??a de seguros [place] su medicamento, por favor perm?tanos de 1 a 2 d?as h?biles para completar este proceso. ? ?Los precios de los medicamentos var?an con frecuencia dependiendo del Environmental consultant de d?nde se surte la receta y alguna farmacias pueden ofrecer precios m?s baratos. ? ?El sitio web www.goodrx.com tiene cupones para medicamentos de Airline pilot. Los precios aqu? no tienen en cuenta lo que podr?a costar con la ayuda del seguro (puede ser m?s barato con su seguro), pero el sitio web puede darle el precio  si no utiliz? ning?n seguro.  ?- Puede imprimir el cup?n correspondiente y llevarlo con su receta a la farmacia.  ?- Tambi?n puede pasar por nuestra oficina durante el horario de atenci?n regular y recoger una tarjeta de cupones de GoodRx.  ?- Si necesita que su receta se env?e electr?nicamente a Chiropodist, informe a nuestra oficina a trav?s de MyChart de [place] o por tel?fono llamando al [phone] y presione la opci?n 4. ? ?

## 2021-06-13 ENCOUNTER — Telehealth: Payer: Self-pay | Admitting: Family Medicine

## 2021-06-13 ENCOUNTER — Telehealth: Payer: Self-pay

## 2021-06-13 DIAGNOSIS — R1084 Generalized abdominal pain: Secondary | ICD-10-CM | POA: Insufficient documentation

## 2021-06-13 DIAGNOSIS — R350 Frequency of micturition: Secondary | ICD-10-CM | POA: Insufficient documentation

## 2021-06-13 NOTE — Telephone Encounter (Signed)
Called patient reviewed all information and repeated back to me. Will call if any questions.  ? ?

## 2021-06-13 NOTE — Assessment & Plan Note (Signed)
kub ordered for today pending results ?R/o obstruction ?If n/v fever/chillls seek more urgent care ?

## 2021-06-13 NOTE — Telephone Encounter (Addendum)
Pt returning called regarding test result would like a call back [phone]  ?

## 2021-06-13 NOTE — Assessment & Plan Note (Signed)
Continuing to heal  ?Assessed and free of signs of infection ?Continue to keep clean and dry as able ?Keep wound open and able to drain ?F/u with general surgeon as scheduled in two weeks ?If fever/chills let me know.  ?

## 2021-06-13 NOTE — Assessment & Plan Note (Signed)
Ongoing, chronic. Increase water intake. increase fiber intake, suggested daily fiber capsule. Continue colace. Recommend dulcolax pill vs solution as pt unable to tolerate too much of miralax she states. Ordering kub for abdominal pain. Citrate magnesia prn.  ? ?Consider GI vs linzess. ?

## 2021-06-13 NOTE — Progress Notes (Signed)
Dr. Glori Bickers  ?FYI I have been seeing Mackenzie Bonilla for a healing tunneled wound (I sent her to general surgery they opened and it was tunneled) look well. However when she came for f/u yesterday she c/o constipation and slight abdominal pain. This was KUB. Still with kidney stone. Making sure not symptomatic. Just wanted to catch you up.

## 2021-06-13 NOTE — Progress Notes (Signed)
Can we ask pt if any urinary symptoms/ any blood seen in urine? How is abdominal pain improved? Still with nausea? ? ?Any back flank pain?  ?Kidney stone seen on left side. Any left flank pain?  ? ?No obstruction however there is a good amount of stool in the colon.

## 2021-06-14 ENCOUNTER — Other Ambulatory Visit: Payer: Self-pay | Admitting: Family

## 2021-06-14 DIAGNOSIS — N3000 Acute cystitis without hematuria: Secondary | ICD-10-CM

## 2021-06-14 LAB — URINE CULTURE
MICRO NUMBER:: 13333527
SPECIMEN QUALITY:: ADEQUATE

## 2021-06-14 MED ORDER — SULFAMETHOXAZOLE-TRIMETHOPRIM 800-160 MG PO TABS: 1.0000 | ORAL_TABLET | Freq: Two times a day (BID) | ORAL | 0 refills | Status: AC

## 2021-06-14 NOTE — Telephone Encounter (Signed)
See Previous phone note ? ?

## 2021-06-14 NOTE — Progress Notes (Signed)
Pending susceptibility report

## 2021-06-15 ENCOUNTER — Ambulatory Visit: Payer: Medicare PPO | Admitting: Family Medicine

## 2021-06-15 ENCOUNTER — Ambulatory Visit: Payer: Medicare PPO | Admitting: Family

## 2021-06-15 NOTE — Telephone Encounter (Signed)
Called and pt and informed her the information and she said that the urologist but several years. She stated that the urologist told her it came from uric acid coming from plant based food. ?

## 2021-06-19 ENCOUNTER — Ambulatory Visit: Payer: Medicare PPO | Admitting: Family Medicine

## 2021-06-19 ENCOUNTER — Encounter: Payer: Self-pay | Admitting: Family Medicine

## 2021-06-19 VITALS — BP 108/66 | HR 105 | Ht 64.0 in | Wt 284.4 lb

## 2021-06-19 DIAGNOSIS — L659 Nonscarring hair loss, unspecified: Secondary | ICD-10-CM | POA: Insufficient documentation

## 2021-06-19 DIAGNOSIS — N3000 Acute cystitis without hematuria: Secondary | ICD-10-CM

## 2021-06-19 DIAGNOSIS — L0231 Cutaneous abscess of buttock: Secondary | ICD-10-CM

## 2021-06-19 NOTE — Assessment & Plan Note (Signed)
Recent proteus uti ?Taking bactrim, 2 d left ?Enc more water intake  ?Some nausea from abx  ? ?inst to update if she does not continue to improve  ?

## 2021-06-19 NOTE — Patient Instructions (Addendum)
Try and increase fluids (goal of 64 oz of fluid daily)  ? ?Finish the antibiotic for the uti  ?The abscess area looks good now/no signs of infection  ?Keep the abscess area clean and dry  ? ?See the surgeon as planned  ? ?Biotin may help the hair  ?Give it a few months and it not helping then stop it  ? ?Let's check thyroid function today ? ?Over the counter minoxidil is helpful for hair loss  ?Talk to your dermatologist also  ? ? ?

## 2021-06-19 NOTE — Progress Notes (Signed)
Subjective:    Patient ID: Mackenzie Bonilla, female    DOB: 1965/04/01, 56 y.o.   MRN: [id number]  HPI Pt presents for wound check  Hair loss Uti    Wt Readings from Last 3 Encounters:  06/19/21 284 lb 6.4 oz (129 kg)  06/12/21 286 lb 5 oz (129.9 kg)  06/09/21 287 lb 3 oz (130.3 kg)   48.82 kg/m Has lost weight / 40-50 lb  Loosing hair also-notes in the shower  Crown area   Abscess of L buttock Was tunneling [facility] surg Finished keflex (but is on abx for uti now)-bactrim for proteus  Few more days of bactrim yet   Feels like abscess is healing   She did get a bidet, has not installed it  Should make it easier to get clean   Drinks 32 oz water daily   Still has some urgency to urinate   Taking biotin (hair loss/thin through the top)  Taking fiber   Lab Results  Component Value Date   TSH 1.80 11/12/2018   Patient Active Problem List   Diagnosis Date Noted   Hair loss 06/19/2021   Generalized abdominal pain 06/13/2021   Urinary frequency 06/13/2021   Abscess of left buttock 06/02/2021   Wound with tunneling 06/02/2021   Breast pain, right 12/20/2020   Screening mammogram for breast cancer 12/13/2020   Fatigue 06/15/2020   Microscopic hematuria 04/19/2020   Lumbar facet arthropathy 12/29/2019   Chronic bilateral thoracic back pain 12/08/2019   Lumbar degenerative disc disease 12/08/2019   Fibromyalgia 12/08/2019   Chronic pain syndrome 12/08/2019   Lumbar radicular pain (left S1) 12/08/2019   Mid back pain 02/11/2019   Multiple joint pain 11/12/2018   UTI (urinary tract infection) 04/08/2018   Eating disorder 05/23/2017   Gallstones 04/19/2017   Diabetic polyneuropathy associated with type 2 diabetes mellitus (HCC) 04/07/2017   Diabetic angiopathy (HCC) 04/07/2017   Vasomotor symptoms due to menopause 04/07/2017   Mobility impaired 11/12/2016   Urinary incontinence 12/02/2015   Candidal intertrigo 04/06/2015   Cystocele 04/06/2015    Constipation 04/06/2015   Myofascial pain syndrome of lumbar spine 12/28/2014   Hypertriglyceridemia 10/14/2014   Breast mass in female 07/28/2014   Right knee pain 07/23/2013   Fall 07/23/2013   Medication management 11/20/2012   Uric acid kidney stone 11/20/2011   HYPERHIDROSIS 11/07/2009   Menopausal symptoms 06/17/2009   Type 2 diabetes mellitus without complication, without long-term current use of insulin (HCC) 08/23/2008   Vitamin D deficiency 05/07/2008   Vitamin B 12 deficiency 06/04/2007   CHRONIC FATIGUE SYNDROME 11/15/2006   DIVERTICULITIS, HX OF 11/15/2006   PCOS (polycystic ovarian syndrome) 10/16/2006   Hyperlipidemia associated with type 2 diabetes mellitus (HCC) 10/16/2006   Morbid obesity (HCC) 10/16/2006   Generalized anxiety disorder 10/16/2006   PANIC DISORDER 10/16/2006   BULIMIA 10/16/2006   Chronic depression 10/16/2006   CARPAL TUNNEL SYNDROME 10/16/2006   LYMPHEDEMA 10/16/2006   Allergic rhinitis 10/16/2006   Mild intermittent asthma 10/16/2006   TMJ SYNDROME 10/16/2006   GERD 10/16/2006   IRRITABLE BOWEL SYNDROME 10/16/2006   Osteoarthritis 10/16/2006   COUGH, CHRONIC 10/16/2006   Past Medical History:  Diagnosis Date   Allergy    allergic rhinitis   Anemia    Anxiety    Arthritis    osteoarthritis   Asthma    Claustrophobia    does not like oxygen mask covering face   Depression    Diabetes mellitus without complication (  HCC)    Fall 2016   Fatty liver 07/2008   abd. ultrasound - fatty liver ; slt dilated cbd (no stones ) 06/10// abd.  ultrasound normal on 04/2006   Fibromyalgia    GERD (gastroesophageal reflux disease)    EGD negative 06/2001// EGD erythematous mucosa, polyp 08/2008   High uric acid in 24 hour urine specimen    Hyperhidrosis    Hyperlipidemia    Kidney stones    Lymphedema    Obesity    PCOS (polycystic ovarian syndrome)    Shortness of breath dyspnea    Tachycardia    TMJ (dislocation of temporomandibular  joint)    UTI (lower urinary tract infection)    Past Surgical History:  Procedure Laterality Date   BREAST BIOPSY Right 2016   neg   BREAST LUMPECTOMY WITH RADIOACTIVE SEED LOCALIZATION Right 08/02/2014   Procedure: BREAST LUMPECTOMY WITH RADIOACTIVE SEED LOCALIZATION;  Surgeon: Emelia Loron, MD;  Location[facility] OR;  Service: General;  Laterality: Right;   COLONOSCOPY  08/12/2012   Completed by Dr. Cecelia Byars, normal exam.    ENDOMETRIAL BIOPSY  01/2004   ESOPHAGOGASTRODUODENOSCOPY     ESOPHAGOGASTRODUODENOSCOPY (EGD) WITH PROPOFOL N/A 08/18/2019   Procedure: ESOPHAGOGASTRODUODENOSCOPY (EGD) WITH PROPOFOL;  Surgeon: Midge Minium, MD;  Location: [facility]COPY;  Service: Endoscopy;  Laterality: N/A;   FOOT SURGERY     SEPTOPLASTY     two times   TONSILLECTOMY     UTERINE FIBROID SURGERY  06/2005   ablation   uterine tumor  09/2001   VAGUS NERVE STIMULATOR INSERTION     Social History   Tobacco Use   Smoking status: Never   Smokeless tobacco: Never  Vaping Use   Vaping Use: Never used  Substance Use Topics   Alcohol use: No    Alcohol/week: 0.0 standard drinks   Drug use: No   Family History  Problem Relation Age of Onset   Hypertension Father    Cancer Father        pancreatic cancer   Hypertension Sister    Heart disease Maternal Grandmother 61       MI   Diabetes Maternal Grandmother    Heart disease Paternal Grandmother 68       MI   Diabetes Paternal Grandmother    Breast cancer Neg Hx    Allergies  Allergen Reactions   Cephalexin    Codeine     rash   Duloxetine    Levaquin [Levofloxacin] Nausea Only   Lisinopril Cough   Metformin And Related Diarrhea    GI side effects    Quetiapine Other (See Comments)   Sertraline Hcl     REACTION: vivid dreams   Triazolam     REACTION: ? reaction   Zaleplon    Zocor [Simvastatin - High Dose]     achey   Zolpidem Tartrate     hallucinations   Hydrocodone Itching and Rash    REACTION: redness rash itching    Norelgestromin-Eth Estradiol     Patch didn't stick to skin   Current Outpatient Medications on File Prior to Visit  Medication Sig Dispense Refill   acetaminophen (TYLENOL) 500 MG tablet Take 500 mg by mouth every 6 (six) hours as needed.      albuterol (VENTOLIN HFA) 108 (90 Base) MCG/ACT inhaler Inhale 2 puffs into the lungs every 6 (six) hours as needed for wheezing. 18 g 3   ALPRAZolam (XANAX) 1 MG tablet Take 2 mg by mouth 3 (three)  times daily as needed.     ammonium lactate (AMLACTIN) 12 % cream Apply 1 application. topically in the morning and at bedtime. For dry skin 385 g 2   Blood Glucose Monitoring Suppl (FIFTY50 GLUCOSE METER 2.0) w/Device KIT Use as directed     buPROPion (WELLBUTRIN XL) 150 MG 24 hr tablet Take 150 mg by mouth daily.     Cholecalciferol (VITAMIN D3 PO) Take 2,000 Units by mouth daily.     clindamycin (CLEOCIN-T) 1 % lotion Apply topically daily. Qd to bumps on face and chest 60 mL 6   cyclobenzaprine (FLEXERIL) 5 MG tablet Take 1 tablet (5 mg total) by mouth 3 (three) times daily as needed for muscle spasms. 90 tablet 2   desvenlafaxine (PRISTIQ) 100 MG 24 hr tablet      dimenhyDRINATE (DRAMAMINE) 50 MG tablet Take 50 mg by mouth every 8 (eight) hours as needed.     Eflornithine HCl (VANIQA) 13.9 % cream Apply 1 application topically 2 (two) times daily with a meal. Bid to aa hair growth face and neck 45 g 6   fluticasone (FLONASE) 50 MCG/ACT nasal spray Place into both nostrils daily.     gabapentin (NEURONTIN) 100 MG capsule TAKE 2 CAPSULES BY MOUTH EVERY MORNING AND 3 CAPSULES EVERY EVENING 150 capsule 0   ibuprofen (ADVIL) 600 MG tablet Take 1 tablet (600 mg total) by mouth every 8 (eight) hours as needed. 30 tablet 0   Insulin Degludec (TRESIBA [employer]) Inject into the skin. 1 injection nightly 8-10 pm     ketoconazole (NIZORAL) 2 % cream Apply 1 application topically daily. 60 g 2   losartan (COZAAR) 25 MG tablet      mometasone (ELOCON) 0.1 % cream Apply 1-2  times daily as needed to thickened bumps at neck and chest. Avoid applying to face, groin, and axilla. Use as directed. Long-term use can cause thinning of the skin. 45 g 1   nystatin cream (MYCOSTATIN) APPLY TO AFFECTED AREAS 3 TIMES DAILY ASNEEDED 30 g 3   omeprazole (PRILOSEC) 20 MG capsule Take 1 capsule (20 mg total) by mouth 2 (two) times daily. 180 capsule 3   oxybutynin (DITROPAN XL) 15 MG 24 hr tablet TAKE 1 TABLET BY MOUTH DAILY 90 tablet 1   Polyethyl Glycol-Propyl Glycol (SYSTANE OP) Apply 1 drop to eye daily as needed.     pravastatin (PRAVACHOL) 20 MG tablet 1 TABLET BY MOUTH AT BEDTIME. 90 tablet 3   Simethicone (GAS-X PO) Take by mouth as needed.     spironolactone (ALDACTONE) 100 MG tablet TAKE 1 TABLET BY MOUTH DAILY. 90 tablet 0   sulfamethoxazole-trimethoprim (BACTRIM DS) 800-160 MG tablet Take 1 tablet by mouth 2 (two) times daily for 7 days. 14 tablet 0   tirzepatide (MOUNJARO) 7.5 MG/0.5ML Pen Inject 7.5 mg into the skin once a week.     traMADol (ULTRAM) 50 MG tablet TAKE ONE TABLET TWICE A DAY IF NEEDED FOR SEVERE PAIN 30 tablet 0   traZODone (DESYREL) 150 MG tablet Take 300 mg by mouth at bedtime.      zinc oxide (BALMEX) 11.3 % CREA cream Apply 1 application topically 2 (two) times daily.     No current facility-administered medications on file prior to visit.    Review of Systems  Constitutional:  Negative for activity change, appetite change, fatigue, fever and unexpected weight change.  HENT:  Negative for congestion, ear pain, rhinorrhea, sinus pressure and sore throat.   Eyes:  Negative  for pain, redness and visual disturbance.  Respiratory:  Negative for cough, shortness of breath and wheezing.   Cardiovascular:  Negative for chest pain and palpitations.  Gastrointestinal:  Negative for abdominal pain, blood in stool, constipation and diarrhea.  Endocrine: Negative for polydipsia and polyuria.  Genitourinary:  Positive for urgency. Negative for decreased urine  volume, dysuria and frequency.  Musculoskeletal:  Negative for arthralgias, back pain and myalgias.  Skin:  Positive for wound. Negative for pallor and rash.  Allergic/Immunologic: Negative for environmental allergies.  Neurological:  Negative for dizziness, syncope and headaches.  Hematological:  Negative for adenopathy. Does not bruise/bleed easily.  Psychiatric/Behavioral:  Negative for decreased concentration and dysphoric mood. The patient is not nervous/anxious.       Objective:   Physical Exam Constitutional:      General: She is not in acute distress.    Appearance: Normal appearance. She is well-developed. She is obese. She is not ill-appearing or diaphoretic.  HENT:     Head: Normocephalic and atraumatic.  Eyes:     Conjunctiva/sclera: Conjunctivae normal.     Pupils: Pupils are equal, round, and reactive to light.  Neck:     Thyroid: No thyromegaly.     Vascular: No carotid bruit or JVD.  Cardiovascular:     Rate and Rhythm: Regular rhythm. Tachycardia present.     Heart sounds: Normal heart sounds.    No gallop.  Pulmonary:     Effort: Pulmonary effort is normal. No respiratory distress.     Breath sounds: Normal breath sounds. No wheezing or rales.  Abdominal:     General: There is no distension or abdominal bruit.     Palpations: Abdomen is soft.     Comments: No tenderness  Musculoskeletal:     Cervical back: Normal range of motion and neck supple.     Right lower leg: No edema.     Left lower leg: No edema.     Comments: Some excess skin on thighs from recent weight loss   Lymphadenopathy:     Cervical: No cervical adenopathy.  Skin:    General: Skin is warm and dry.     Coloration: Skin is not jaundiced or pale.     Findings: No bruising, erythema or rash.     Comments: 3 mm open area in R inner buttock that appears clean without drainage or tenderness or erythema  Unable to express any material from it   Thinning of hair around the crown  Fine hair as  well  No areas of focal alopecia   No broken hairs or traction evidence      Neurological:     Mental Status: She is alert.     Coordination: Coordination normal.     Deep Tendon Reflexes: Reflexes are normal and symmetric. Reflexes normal.  Psychiatric:        Mood and Affect: Mood normal.          Assessment & Plan:   Problem List Items Addressed This Visit       Genitourinary   UTI (urinary tract infection)    Recent proteus uti Taking bactrim, 2 d left Enc more water intake  Some nausea from abx   inst to update if she does not continue to improve          Other   Abscess of left buttock - Primary    On my exam/actually on R side (per pt always the R)  Only small opening remains (about  3 mm) without drainage , no tenderness or pain  For surgeon f/u on 5/16 She will alert [place] if any symptoms worsen before then   Last few office notes and surgical notes reviewed Last labs reviewed        Hair loss    With intentional wt loss of 40-50 lb , at the crown (no focal alopecia)   Suspect this is the cause and if so it should improve  TSH ordered today  Recommend otc minoxidil topically  Can discuss with derm  Noted that biotin helps some people as well        Relevant Orders   TSH

## 2021-06-19 NOTE — Assessment & Plan Note (Signed)
With intentional wt loss of 40-50 lb , at the crown (no focal alopecia)  ? ?Suspect this is the cause and if so it should improve  ?TSH ordered today  ?Recommend otc minoxidil topically  ?Can discuss with derm  ?Noted that biotin helps some people as well  ?

## 2021-06-19 NOTE — Assessment & Plan Note (Addendum)
On my exam/actually on R side (per pt always the R)  ?Only small opening remains (about 3 mm) without drainage , no tenderness or pain  ?For surgeon f/u on 5/16 ?She will alert [place] if any symptoms worsen before then  ? ?Last few office notes and surgical notes reviewed ?Last labs reviewed  ?

## 2021-06-20 LAB — TSH: TSH: 1.33 u[IU]/mL (ref 0.35–5.50)

## 2021-06-23 ENCOUNTER — Telehealth: Payer: Self-pay

## 2021-06-23 NOTE — Telephone Encounter (Signed)
Pt called stating she is having issues with her personal thoughts about her body as she is losing weight. She feels like she needs to see a psychologist or someone who specializes with people as they lose weight. ?

## 2021-06-25 NOTE — Telephone Encounter (Signed)
Does she have a therapist currently or recently ?   Thanks for letting me know  ?

## 2021-06-26 NOTE — Telephone Encounter (Signed)
She said that she is currently see a therapist that give her medication , be is not some that she talks with. She wanted to see some that  specialize  with weight loss. She also want to if she can take black seed oil  for hair loss and biotin  or supplements  for hair loss.  ?

## 2021-06-26 NOTE — Telephone Encounter (Signed)
Called and spoke with patient gave her this information. ?

## 2021-06-26 NOTE — Telephone Encounter (Signed)
I do not currently have anyone I refer to for eating issues but her psychiatrist may have some names (and I do think talk therapy is important)  ? ?The biotin is ok to try but if she does not note improvement in a month please stop it  ?

## 2021-06-27 ENCOUNTER — Other Ambulatory Visit: Payer: Self-pay

## 2021-06-27 ENCOUNTER — Ambulatory Visit: Payer: Medicare PPO | Admitting: Surgery

## 2021-06-27 ENCOUNTER — Encounter: Payer: Self-pay | Admitting: Surgery

## 2021-06-27 VITALS — BP 104/72 | HR 77 | Temp 97.9°F | Ht 64.0 in | Wt 279.4 lb

## 2021-06-27 DIAGNOSIS — K5900 Constipation, unspecified: Secondary | ICD-10-CM | POA: Diagnosis not present

## 2021-06-27 NOTE — Patient Instructions (Signed)
Please call the office with any questions or concerns.  ? ? ?Advised to pursue a goal of 25 to 30 g of fiber daily.  Made aware that the majority of this may be through natural sources, but advised to be aware of actual consumption and to ensure minimal consumption by daily supplementation.  Various forms of supplements discussed.  Recommended Psyllium husk, that mixes well with applesauce, or the powder which goes down well shaken with chocolate milk.  ?Strongly advised to consume more fluids to ensure adequate hydration, instructed to watch color of urine to determine adequacy of hydration.  Clarity is pursued in urine output, and bowel activity that correlates to significant meal intake.   ?We need to avoid deferring having bowel movements, advised to take the time at the first sign of sensation, typically following meals, and in the morning.   ?Subsequent utilization of MiraLAX may be needed ensure at least daily movement, ideally twice daily bowel movements.  If multiple doses of MiraLAX are necessary utilize them. ?Never skip a day...  ?To be regular, we must do the above EVERY day.  ? ?

## 2021-06-27 NOTE — Progress Notes (Signed)
Patient ID: Mackenzie Bonilla, female   DOB: 09-22-1965, 56 y.o.   MRN: [id number] ? ?Chief Complaint: Follow-up for abscess right medial buttock.  ? ?History of Present Illness ?ADRIANE Bonilla is a 56 y.o. female with a draining abscess that seems to have, to complete healing.  She denies any drainage, known fevers or chills.  She continues to debate that Metamucil does not work for her.  She has not utilized any laxative regimen, and she feels like she is getting some servings of vegetables which help give her all the fiber she needs.  She did report a small amount of blood on a bowel movement, and is still concerned about hemorrhoids.  She does report having to strain with her bowel activity. ? ?Past Medical History ?Past Medical History:  ?Diagnosis Date  ? Allergy   ? allergic rhinitis  ? Anemia   ? Anxiety   ? Arthritis   ? osteoarthritis  ? Asthma   ? Claustrophobia   ? does not like oxygen mask covering face  ? Depression   ? Diabetes mellitus without complication ([place])   ? Fall 2016  ? Fatty liver 07/2008  ? abd. ultrasound - fatty liver ; slt dilated cbd (no stones ) 06/10// abd.  ultrasound normal on 04/2006  ? Fibromyalgia   ? GERD (gastroesophageal reflux disease)   ? EGD negative 06/2001// EGD erythematous mucosa, polyp 08/2008  ? High uric acid in 24 hour urine specimen   ? Hyperhidrosis   ? Hyperlipidemia   ? Kidney stones   ? Lymphedema   ? Obesity   ? PCOS (polycystic ovarian syndrome)   ? Shortness of breath dyspnea   ? Tachycardia   ? TMJ (dislocation of temporomandibular joint)   ? UTI (lower urinary tract infection)   ?  ? ? ?Past Surgical History:  ?Procedure Laterality Date  ? BREAST BIOPSY Right 2016  ? neg  ? BREAST LUMPECTOMY WITH RADIOACTIVE SEED LOCALIZATION Right 08/02/2014  ? Procedure: BREAST LUMPECTOMY WITH RADIOACTIVE SEED LOCALIZATION;  Surgeon: Rolm Bookbinder, MD;  Location: [facility];  Service: General;  Laterality: Right;  ? COLONOSCOPY  08/12/2012  ? Completed by Dr. Phylis Bougie,  normal exam.   ? ENDOMETRIAL BIOPSY  01/2004  ? ESOPHAGOGASTRODUODENOSCOPY    ? ESOPHAGOGASTRODUODENOSCOPY (EGD) WITH PROPOFOL N/A 08/18/2019  ? Procedure: ESOPHAGOGASTRODUODENOSCOPY (EGD) WITH PROPOFOL;  Surgeon: Lucilla Lame, MD;  Location: [facility] ENDOSCOPY;  Service: Endoscopy;  Laterality: N/A;  ? FOOT SURGERY    ? SEPTOPLASTY    ? two times  ? TONSILLECTOMY    ? UTERINE FIBROID SURGERY  06/2005  ? ablation  ? uterine tumor  09/2001  ? VAGUS NERVE STIMULATOR INSERTION    ? ? ?Allergies  ?Allergen Reactions  ? Cephalexin   ? Codeine   ?  rash  ? Duloxetine   ? Levaquin [Levofloxacin] Nausea Only  ? Lisinopril Cough  ? Metformin And Related Diarrhea  ?  GI side effects   ? Quetiapine Other (See Comments)  ? Sertraline Hcl   ?  REACTION: vivid dreams  ? Triazolam   ?  REACTION: ? reaction  ? Zaleplon   ? Zocor [Simvastatin - High Dose]   ?  achey  ? Zolpidem Tartrate   ?  hallucinations  ? Hydrocodone Itching and Rash  ?  REACTION: redness rash itching  ? Norelgestromin-Eth Estradiol   ?  Patch didn't stick to skin  ? ? ?Current Outpatient Medications  ?Medication Sig Dispense Refill  ?  acetaminophen (TYLENOL) 500 MG tablet Take 500 mg by mouth every 6 (six) hours as needed.     ? albuterol (VENTOLIN HFA) 108 (90 Base) MCG/ACT inhaler Inhale 2 puffs into the lungs every 6 (six) hours as needed for wheezing. 18 g 3  ? ALPRAZolam (XANAX) 1 MG tablet Take 2 mg by mouth 3 (three) times daily as needed.    ? ammonium lactate (AMLACTIN) 12 % cream Apply 1 application. topically in the morning and at bedtime. For dry skin 385 g 2  ? Blood Glucose Monitoring Suppl (FIFTY50 GLUCOSE METER 2.0) w/Device KIT Use as directed    ? buPROPion (WELLBUTRIN XL) 150 MG 24 hr tablet Take 150 mg by mouth daily.    ? Cholecalciferol (VITAMIN D3 PO) Take 2,000 Units by mouth daily.    ? clindamycin (CLEOCIN-T) 1 % lotion Apply topically daily. Qd to bumps on face and chest 60 mL 6  ? cyclobenzaprine (FLEXERIL) 5 MG tablet Take 1 tablet (5 mg  total) by mouth 3 (three) times daily as needed for muscle spasms. 90 tablet 2  ? desvenlafaxine (PRISTIQ) 100 MG 24 hr tablet     ? dimenhyDRINATE (DRAMAMINE) 50 MG tablet Take 50 mg by mouth every 8 (eight) hours as needed.    ? Eflornithine HCl (VANIQA) 13.9 % cream Apply 1 application topically 2 (two) times daily with a meal. Bid to aa hair growth face and neck 45 g 6  ? fluticasone (FLONASE) 50 MCG/ACT nasal spray Place into both nostrils daily.    ? gabapentin (NEURONTIN) 100 MG capsule TAKE 2 CAPSULES BY MOUTH EVERY MORNING AND 3 CAPSULES EVERY EVENING 150 capsule 0  ? ibuprofen (ADVIL) 600 MG tablet Take 1 tablet (600 mg total) by mouth every 8 (eight) hours as needed. 30 tablet 0  ? Insulin Degludec (TRESIBA [place]) Inject into the skin. 1 injection nightly 8-10 pm    ? ketoconazole (NIZORAL) 2 % cream Apply 1 application topically daily. 60 g 2  ? losartan (COZAAR) 25 MG tablet     ? mometasone (ELOCON) 0.1 % cream Apply 1-2 times daily as needed to thickened bumps at neck and chest. Avoid applying to face, groin, and axilla. Use as directed. Long-term use can cause thinning of the skin. 45 g 1  ? nystatin cream (MYCOSTATIN) APPLY TO AFFECTED AREAS 3 TIMES DAILY ASNEEDED 30 g 3  ? omeprazole (PRILOSEC) 20 MG capsule Take 1 capsule (20 mg total) by mouth 2 (two) times daily. 180 capsule 3  ? oxybutynin (DITROPAN XL) 15 MG 24 hr tablet TAKE 1 TABLET BY MOUTH DAILY 90 tablet 1  ? Polyethyl Glycol-Propyl Glycol (SYSTANE OP) Apply 1 drop to eye daily as needed.    ? pravastatin (PRAVACHOL) 20 MG tablet 1 TABLET BY MOUTH AT BEDTIME. 90 tablet 3  ? Simethicone (GAS-X PO) Take by mouth as needed.    ? spironolactone (ALDACTONE) 100 MG tablet TAKE 1 TABLET BY MOUTH DAILY. 90 tablet 0  ? tirzepatide (MOUNJARO) 7.5 MG/0.5ML Pen Inject 7.5 mg into the skin once a week.    ? traMADol (ULTRAM) 50 MG tablet TAKE ONE TABLET TWICE A DAY IF NEEDED FOR SEVERE PAIN 30 tablet 0  ? traZODone (DESYREL) 150 MG tablet Take 300 mg by  mouth at bedtime.     ? zinc oxide (BALMEX) 11.3 % CREA cream Apply 1 application topically 2 (two) times daily.    ? ?No current facility-administered medications for this visit.  ? ? ?Family  History ?Family History  ?Problem Relation Age of Onset  ? Hypertension Father   ? Cancer Father   ?     pancreatic cancer  ? Hypertension Sister   ? Heart disease Maternal Grandmother 60  ?     MI  ? Diabetes Maternal Grandmother   ? Heart disease Paternal Grandmother 53  ?     MI  ? Diabetes Paternal Grandmother   ? Breast cancer Neg Hx   ?  ? ? ?Social History ?Social History  ? ?Tobacco Use  ? Smoking status: Never  ? Smokeless tobacco: Never  ?Vaping Use  ? Vaping Use: Never used  ?Substance Use Topics  ? Alcohol use: No  ?  Alcohol/week: 0.0 standard drinks  ? Drug use: No  ?  ?  ? ? ?Review of Systems  ?Constitutional:  Positive for malaise/fatigue.  ?HENT: Negative.    ?Eyes:  Positive for blurred vision.  ?Respiratory:  Positive for sputum production and shortness of breath.   ?Cardiovascular:  Positive for leg swelling (Lymphedema).  ?Gastrointestinal:  Positive for constipation and nausea.  ?Genitourinary:  Positive for urgency.  ?Skin:  Negative for itching and rash.  ?Neurological:  Positive for dizziness.  ?Psychiatric/Behavioral:  Positive for depression.   ?  ? ?Physical Exam ?Blood pressure 104/72, pulse 77, temperature 97.9 ?F (36.6 ?C), temperature source Oral, height _0  (1.626 m), weight 279 lb 6.4 oz (126.7 kg), SpO2 97 %. ?Last Weight  Most recent update: 06/27/2021 10:14 AM  ? ? Weight  ?126.7 kg (279 lb 6.4 oz)  ?      ? ?  ? ? ?CONSTITUTIONAL: Well developed, and nourished, appropriately responsive and aware without distress.  ?Morbidly obese. ?EYES: Sclera non-icteric.   ?EARS, NOSE, MOUTH AND THROAT:  The oropharynx is clear. Oral mucosa is pink and moist.   Hearing is intact to voice.  ?NECK: Trachea is midline, and there is no jugular venous distension.  ?LYMPH NODES:  Lymph nodes in the neck are  not appreciable. ?RESPIRATORY:  Normal respiratory effort without pathologic use of accessory muscles. ?CARDIOVASCULAR: Heart is regular in rate ?GI: The abdomen is  soft, nontender, and nondistended

## 2021-07-05 ENCOUNTER — Encounter: Payer: Self-pay | Admitting: Family Medicine

## 2021-07-05 ENCOUNTER — Ambulatory Visit: Payer: Medicare PPO | Admitting: Family Medicine

## 2021-07-05 VITALS — BP 126/64 | HR 80 | Temp 97.8°F | Ht 64.0 in | Wt 284.1 lb

## 2021-07-05 DIAGNOSIS — K5900 Constipation, unspecified: Secondary | ICD-10-CM

## 2021-07-05 DIAGNOSIS — N898 Other specified noninflammatory disorders of vagina: Secondary | ICD-10-CM | POA: Diagnosis not present

## 2021-07-05 DIAGNOSIS — R339 Retention of urine, unspecified: Secondary | ICD-10-CM

## 2021-07-05 DIAGNOSIS — R350 Frequency of micturition: Secondary | ICD-10-CM | POA: Diagnosis not present

## 2021-07-05 DIAGNOSIS — K582 Mixed irritable bowel syndrome: Secondary | ICD-10-CM

## 2021-07-05 DIAGNOSIS — N3000 Acute cystitis without hematuria: Secondary | ICD-10-CM | POA: Diagnosis not present

## 2021-07-05 DIAGNOSIS — B3731 Acute candidiasis of vulva and vagina: Secondary | ICD-10-CM | POA: Diagnosis not present

## 2021-07-05 LAB — POC URINALSYSI DIPSTICK (AUTOMATED)
Bilirubin, UA: NEGATIVE
Blood, UA: 200
Glucose, UA: NEGATIVE
Ketones, UA: NEGATIVE
Nitrite, UA: POSITIVE
Protein, UA: POSITIVE — AB
Spec Grav, UA: >=1.03 — AB (ref 1.010–1.025)
Urobilinogen, UA: 0.2 E.U./dL
pH, UA: 6 (ref 5.0–8.0)

## 2021-07-05 LAB — POCT WET PREP (WET MOUNT): Trichomonas Wet Prep HPF POC: ABSENT

## 2021-07-05 MED ORDER — FLUCONAZOLE 150 MG PO TABS: ORAL_TABLET | ORAL | 0 refills | Status: DC

## 2021-07-05 NOTE — Assessment & Plan Note (Signed)
Acute on chronic  Working to get more fiber and fluids in   inst to try miralax 3 times daily until bms are more regular and then reduce to as much as needed  Reassuring exam today

## 2021-07-05 NOTE — Patient Instructions (Addendum)
For yeast vaginal/skin infection take a diflucan now and again in 2 days   Ketoconazole is ok for irritated skin areas /creases   Let us know if it dose not help   Drink lots of water for your urinary health   I want to check a urine culture to see if you have a uti and what antibiotics will work  Please give a sample and we will contact you with the culture result    For constipation  Start mixing miralax into 3 of your drinks per day  Drink more fluids/water in general  Once you have more regular bowel movements you can reduce the miralax to use as needed  Continue to talk to your nutrition/meal person

## 2021-07-05 NOTE — Assessment & Plan Note (Signed)
Recently constipation predominant Working with nutrition changes- trying to inc psyllium and different proteins

## 2021-07-05 NOTE — Progress Notes (Unsigned)
Subjective:    Patient ID: Mackenzie Bonilla, female    DOB: 05/03/1965, 56 y.o.   MRN: [id number]  HPI Pt presents for c/o vaginal discomfort  Trouble urinating and some constipation    Wt Readings from Last 3 Encounters:  07/05/21 284 lb 2 oz (128.9 kg)  06/27/21 279 lb 6.4 oz (126.7 kg)  06/19/21 284 lb 6.4 oz (129 kg)   48.77 kg/m  Creases of thighs -irritated/where underwear hits  Used some powder  Unsure which cream to use   When she sits-it hurts in the vaginal area  Cannot tell if there is a bump  A little sweet smell ? Urine or vaginal  Either vaginal discharge or sweat   Irritated to sit  Does not think it is the abscess this time  A little itching to start, now irritated   No scented pads   Frequent urination  (not a lot of volume)  Has to go constantly  Urgency also  Gets up a few times at night   (then cuts her fluid)   Some discomfort in bladder area  Constipation also (chronic)   Discussed with her meal maker - planning to discuss protein  Suggested psyllium husk    Results for orders placed or performed in visit on 07/05/21  POCT Urinalysis Dipstick (Automated)  Result Value Ref Range   Color, UA Yellow    Clarity, UA Cloudy    Glucose, UA Negative Negative   Bilirubin, UA Negative    Ketones, UA Negative    Spec Grav, UA >=1.030 (A) 1.010 - 1.025   Blood, UA 200 Ery/uL    pH, UA 6.0 5.0 - 8.0   Protein, UA Positive (A) Negative   Urobilinogen, UA 0.2 0.2 or 1.0 E.U./dL   Nitrite, UA Positive    Leukocytes, UA Large (3+) (A) Negative  POCT Wet Prep ([place])  Result Value Ref Range   Source Wet Prep POC vaginal    WBC, Wet Prep HPF POC few    Bacteria Wet Prep HPF POC Few Few   BACTERIA WET PREP MORPHOLOGY POC     Clue Cells Wet Prep HPF POC None None   Clue Cells Wet Prep Whiff POC     Yeast Wet Prep HPF POC Few (A) None   KOH Wet Prep POC Few (A) None   Trichomonas Wet Prep HPF POC Absent Absent     She took bactrim for  proteus uti early this month Proteus resistant to macrobid   Unable to tolerate a lot of miralax   DG Abd 1 View  Result Date: 06/12/2021 CLINICAL DATA:  Abdominal pain with constipation. EXAM: ABDOMEN - 1 VIEW COMPARISON:  Abdominal x-ray 06/05/2011. CT abdomen and pelvis 06/06/2011. FINDINGS: The bowel gas pattern is normal. There is moderate stool burden. Questionable left renal calculus identified measuring 5 mm similar to prior CT. Calcifications in the pelvis are favored as phleboliths, although distal ureteral or bladder calculus would be difficult to exclude. No acute fractures are identified. Visualized lung bases are clear. IMPRESSION: 1. Questionable left renal calculus measuring 5 mm as seen on prior CT. 2. Nonobstructive bowel gas pattern. Electronically Signed   By: Ronney Asters M.D.   On: 06/12/2021 21:55    Pain medication  Oxybutinin   On omeprazole 20 mg bid   Patient Active Problem List   Diagnosis Date Noted   Frequent urination 07/05/2021   Vaginal discharge 07/05/2021   Hair loss 06/19/2021   Generalized  abdominal pain 06/13/2021   Urinary frequency 06/13/2021   Abscess of left buttock 06/02/2021   Wound with tunneling 06/02/2021   Breast pain, right 12/20/2020   Screening mammogram for breast cancer 12/13/2020   Fatigue 06/15/2020   Microscopic hematuria 04/19/2020   Lumbar facet arthropathy 12/29/2019   Chronic bilateral thoracic back pain 12/08/2019   Lumbar degenerative disc disease 12/08/2019   Fibromyalgia 12/08/2019   Chronic pain syndrome 12/08/2019   Lumbar radicular pain (left S1) 12/08/2019   Mid back pain 02/11/2019   Multiple joint pain 11/12/2018   UTI (urinary tract infection) 04/08/2018   Eating disorder 05/23/2017   Gallstones 04/19/2017   Diabetic polyneuropathy associated with type 2 diabetes mellitus ([place]) 04/07/2017   Diabetic angiopathy ([place]) 04/07/2017   Vasomotor symptoms due to menopause 04/07/2017   Mobility impaired 11/12/2016    Urinary incontinence 12/02/2015   Candidal intertrigo 04/06/2015   Cystocele 04/06/2015   Constipation 04/06/2015   Myofascial pain syndrome of lumbar spine 12/28/2014   Hypertriglyceridemia 10/14/2014   Breast mass in female 07/28/2014   Right knee pain 07/23/2013   Fall 07/23/2013   Medication management 11/20/2012   Uric acid kidney stone 11/20/2011   HYPERHIDROSIS 11/07/2009   Menopausal symptoms 06/17/2009   Type 2 diabetes mellitus without complication, without long-term current use of insulin ([place]) 08/23/2008   Vitamin D deficiency 05/07/2008   Vitamin B 12 deficiency 06/04/2007   CHRONIC FATIGUE SYNDROME 11/15/2006   DIVERTICULITIS, HX OF 11/15/2006   PCOS (polycystic ovarian syndrome) 10/16/2006   Hyperlipidemia associated with type 2 diabetes mellitus ([place]) 10/16/2006   Morbid obesity ([place]) 10/16/2006   Generalized anxiety disorder 10/16/2006   PANIC DISORDER 10/16/2006   BULIMIA 10/16/2006   Chronic depression 10/16/2006   CARPAL TUNNEL SYNDROME 10/16/2006   LYMPHEDEMA 10/16/2006   Allergic rhinitis 10/16/2006   Mild intermittent asthma 10/16/2006   TMJ SYNDROME 10/16/2006   GERD 10/16/2006   IRRITABLE BOWEL SYNDROME 10/16/2006   Osteoarthritis 10/16/2006   COUGH, CHRONIC 10/16/2006   Past Medical History:  Diagnosis Date   Allergy    allergic rhinitis   Anemia    Anxiety    Arthritis    osteoarthritis   Asthma    Claustrophobia    does not like oxygen mask covering face   Depression    Diabetes mellitus without complication ([place])    Fall 2016   Fatty liver 07/2008   abd. ultrasound - fatty liver ; slt dilated cbd (no stones ) 06/10// abd.  ultrasound normal on 04/2006   Fibromyalgia    GERD (gastroesophageal reflux disease)    EGD negative 06/2001// EGD erythematous mucosa, polyp 08/2008   High uric acid in 24 hour urine specimen    Hyperhidrosis    Hyperlipidemia    Kidney stones    Lymphedema    Obesity    PCOS (polycystic ovarian syndrome)     Shortness of breath dyspnea    Tachycardia    TMJ (dislocation of temporomandibular joint)    UTI (lower urinary tract infection)    Past Surgical History:  Procedure Laterality Date   BREAST BIOPSY Right 2016   neg   BREAST LUMPECTOMY WITH RADIOACTIVE SEED LOCALIZATION Right 08/02/2014   Procedure: BREAST LUMPECTOMY WITH RADIOACTIVE SEED LOCALIZATION;  Surgeon: Rolm Bookbinder, MD;  Location: [facility];  Service: General;  Laterality: Right;   COLONOSCOPY  08/12/2012   Completed by Dr. Phylis Bougie, normal exam.    ENDOMETRIAL BIOPSY  01/2004   ESOPHAGOGASTRODUODENOSCOPY     ESOPHAGOGASTRODUODENOSCOPY (  EGD) WITH PROPOFOL N/A 08/18/2019   Procedure: ESOPHAGOGASTRODUODENOSCOPY (EGD) WITH PROPOFOL;  Surgeon: Lucilla Lame, MD;  Location: [facility] ENDOSCOPY;  Service: Endoscopy;  Laterality: N/A;   FOOT SURGERY     SEPTOPLASTY     two times   TONSILLECTOMY     UTERINE FIBROID SURGERY  06/2005   ablation   uterine tumor  09/2001   VAGUS NERVE STIMULATOR INSERTION     Social History   Tobacco Use   Smoking status: Never   Smokeless tobacco: Never  Vaping Use   Vaping Use: Never used  Substance Use Topics   Alcohol use: No    Alcohol/week: 0.0 standard drinks   Drug use: No   Family History  Problem Relation Age of Onset   Hypertension Father    Cancer Father        pancreatic cancer   Hypertension Sister    Heart disease Maternal Grandmother 33       MI   Diabetes Maternal Grandmother    Heart disease Paternal Grandmother 59       MI   Diabetes Paternal Grandmother    Breast cancer Neg Hx    Allergies  Allergen Reactions   Cephalexin    Codeine     rash   Duloxetine    Levaquin [Levofloxacin] Nausea Only   Lisinopril Cough   Metformin And Related Diarrhea    GI side effects    Quetiapine Other (See Comments)   Sertraline Hcl     REACTION: vivid dreams   Triazolam     REACTION: ? reaction   Zaleplon    Zocor [Simvastatin - High Dose]     achey   Zolpidem Tartrate      hallucinations   Hydrocodone Itching and Rash    REACTION: redness rash itching   Norelgestromin-Eth Estradiol     Patch didn't stick to skin   Current Outpatient Medications on File Prior to Visit  Medication Sig Dispense Refill   acetaminophen (TYLENOL) 500 MG tablet Take 500 mg by mouth every 6 (six) hours as needed.      albuterol (VENTOLIN HFA) 108 (90 Base) MCG/ACT inhaler Inhale 2 puffs into the lungs every 6 (six) hours as needed for wheezing. 18 g 3   ALPRAZolam (XANAX) 1 MG tablet Take 2 mg by mouth 3 (three) times daily as needed.     ammonium lactate (AMLACTIN) 12 % cream Apply 1 application. topically in the morning and at bedtime. For dry skin 385 g 2   Blood Glucose Monitoring Suppl (FIFTY50 GLUCOSE METER 2.0) w/Device KIT Use as directed     buPROPion (WELLBUTRIN XL) 150 MG 24 hr tablet Take 150 mg by mouth daily.     Cholecalciferol (VITAMIN D3 PO) Take 2,000 Units by mouth daily.     clindamycin (CLEOCIN-T) 1 % lotion Apply topically daily. Qd to bumps on face and chest 60 mL 6   cyclobenzaprine (FLEXERIL) 5 MG tablet Take 1 tablet (5 mg total) by mouth 3 (three) times daily as needed for muscle spasms. 90 tablet 2   desvenlafaxine (PRISTIQ) 100 MG 24 hr tablet      dimenhyDRINATE (DRAMAMINE) 50 MG tablet Take 50 mg by mouth every 8 (eight) hours as needed.     Docusate Calcium (STOOL SOFTENER PO) Take 1-2 tablets by mouth daily as needed.     Eflornithine HCl (VANIQA) 13.9 % cream Apply 1 application topically 2 (two) times daily with a meal. Bid to aa hair growth face  and neck 45 g 6   fluticasone (FLONASE) 50 MCG/ACT nasal spray Place into both nostrils daily.     gabapentin (NEURONTIN) 100 MG capsule TAKE 2 CAPSULES BY MOUTH EVERY MORNING AND 3 CAPSULES EVERY EVENING 150 capsule 0   ibuprofen (ADVIL) 600 MG tablet Take 1 tablet (600 mg total) by mouth every 8 (eight) hours as needed. 30 tablet 0   Insulin Degludec (TRESIBA [place]) Inject into the skin. 1 injection  nightly 8-10 pm     ketoconazole (NIZORAL) 2 % cream Apply 1 application topically daily. 60 g 2   losartan (COZAAR) 25 MG tablet      mometasone (ELOCON) 0.1 % cream Apply 1-2 times daily as needed to thickened bumps at neck and chest. Avoid applying to face, groin, and axilla. Use as directed. Long-term use can cause thinning of the skin. 45 g 1   nystatin cream (MYCOSTATIN) APPLY TO AFFECTED AREAS 3 TIMES DAILY ASNEEDED 30 g 3   omeprazole (PRILOSEC) 20 MG capsule Take 1 capsule (20 mg total) by mouth 2 (two) times daily. 180 capsule 3   OVER THE COUNTER MEDICATION Take 1 capsule by mouth daily. BARIATRIC MULTIVITAMIN IRON FREE     oxybutynin (DITROPAN XL) 15 MG 24 hr tablet TAKE 1 TABLET BY MOUTH DAILY 90 tablet 1   Polyethyl Glycol-Propyl Glycol (SYSTANE OP) Apply 1 drop to eye daily as needed.     pravastatin (PRAVACHOL) 20 MG tablet 1 TABLET BY MOUTH AT BEDTIME. 90 tablet 3   psyllium (METAMUCIL) 58.6 % powder Take 1 packet by mouth 2 (two) times daily.     Simethicone (GAS-X PO) Take by mouth as needed.     spironolactone (ALDACTONE) 100 MG tablet TAKE 1 TABLET BY MOUTH DAILY. 90 tablet 0   tirzepatide (MOUNJARO) 7.5 MG/0.5ML Pen Inject 10 mg into the skin once a week.     traMADol (ULTRAM) 50 MG tablet TAKE ONE TABLET TWICE A DAY IF NEEDED FOR SEVERE PAIN 30 tablet 0   traZODone (DESYREL) 150 MG tablet Take 300 mg by mouth at bedtime.      zinc oxide (BALMEX) 11.3 % CREA cream Apply 1 application topically 2 (two) times daily.     No current facility-administered medications on file prior to visit.    Review of Systems  Constitutional:  Positive for fatigue. Negative for activity change, appetite change, fever and unexpected weight change.  HENT:  Negative for congestion, rhinorrhea, sore throat and trouble swallowing.   Eyes:  Negative for pain, redness, itching and visual disturbance.  Respiratory:  Negative for cough, chest tightness, shortness of breath and wheezing.    Cardiovascular:  Negative for chest pain and palpitations.  Gastrointestinal:  Positive for constipation. Negative for abdominal pain, blood in stool, diarrhea, nausea and rectal pain.  Endocrine: Negative for cold intolerance, heat intolerance, polydipsia and polyuria.  Genitourinary:  Positive for difficulty urinating, frequency, urgency, vaginal discharge and vaginal pain. Negative for dysuria.  Musculoskeletal:  Negative for arthralgias, joint swelling and myalgias.  Skin:  Negative for pallor and rash.  Neurological:  Negative for dizziness, tremors, weakness, numbness and headaches.  Hematological:  Negative for adenopathy. Does not bruise/bleed easily.  Psychiatric/Behavioral:  Negative for decreased concentration and dysphoric mood. The patient is not nervous/anxious.       Objective:   Physical Exam Constitutional:      General: She is not in acute distress.    Appearance: Normal appearance. She is well-developed. She is obese. She is not  ill-appearing or diaphoretic.  HENT:     Head: Normocephalic and atraumatic.  Eyes:     Conjunctiva/sclera: Conjunctivae normal.     Pupils: Pupils are equal, round, and reactive to light.  Neck:     Thyroid: No thyromegaly.     Vascular: No carotid bruit or JVD.  Cardiovascular:     Rate and Rhythm: Normal rate and regular rhythm.     Heart sounds: Normal heart sounds.    No gallop.  Pulmonary:     Effort: Pulmonary effort is normal. No respiratory distress.     Breath sounds: Normal breath sounds. No wheezing or rales.  Abdominal:     General: There is no distension or abdominal bruit.     Palpations: Abdomen is soft.  Genitourinary:    Comments: White vaginal discharge Mucosa is hyperemic including labia minora   Wet prep pos for hyphae   No masses  No skin breakdown    Musculoskeletal:     Cervical back: Normal range of motion and neck supple.     Right lower leg: No edema.     Left lower leg: No edema.   Lymphadenopathy:     Cervical: No cervical adenopathy.  Skin:    General: Skin is warm and dry.     Coloration: Skin is not pale.     Findings: No rash.     Comments: Some mild erythema of inner thighs Area under pannus is clear today  Neurological:     Mental Status: She is alert.     Coordination: Coordination normal.     Deep Tendon Reflexes: Reflexes are normal and symmetric. Reflexes normal.  Psychiatric:        Mood and Affect: Mood is depressed. Affect is tearful.     Comments: Tearful at times           Assessment & Plan:   Problem List Items Addressed This Visit       Digestive   IRRITABLE BOWEL SYNDROME    Recently constipation predominant Working with nutrition changes- trying to inc psyllium and different proteins        Relevant Medications   psyllium (METAMUCIL) 58.6 % powder   Docusate Calcium (STOOL SOFTENER PO)     Genitourinary   UTI (urinary tract infection)    Urine is positive today (need more sample for culture however)  Will send that  Pend result to choose tx  Last took bactrim for proteus  Also yeast vaginitis today   inst to notify us if symptoms worsen  inst to inc water intake        Relevant Medications   fluconazole (DIFLUCAN) 150 MG tablet   Other Relevant Orders   Urine Culture   Yeast vaginitis - Primary    Wet prep with yeast Diabetic pt with recent abx  Px diflucan 150 mg now and in 3 d Update if not starting to improve in a week or if worsening           Relevant Medications   fluconazole (DIFLUCAN) 150 MG tablet     Other   Constipation    Acute on chronic  Working to get more fiber and fluids in   inst to try miralax 3 times daily until bms are more regular and then reduce to as much as needed  Reassuring exam today        Frequent urination   Other Visit Diagnoses     Urinary retention  Relevant Orders   POCT Urinalysis Dipstick (Automated) (Completed)

## 2021-07-05 NOTE — Assessment & Plan Note (Signed)
Wet prep with yeast Diabetic pt with recent abx  Px diflucan 150 mg now and in 3 d Update if not starting to improve in a week or if worsening

## 2021-07-05 NOTE — Assessment & Plan Note (Addendum)
Urine is positive today (need more sample for culture however)  Will send that  Pend result to choose tx  Last took bactrim for proteus  Also yeast vaginitis today   inst to notify us if symptoms worsen  inst to inc water intake   Addendum E coli cx Sent bactrim

## 2021-07-06 ENCOUNTER — Other Ambulatory Visit: Payer: Self-pay | Admitting: Podiatry

## 2021-07-06 DIAGNOSIS — M778 Other enthesopathies, not elsewhere classified: Secondary | ICD-10-CM

## 2021-07-06 NOTE — Telephone Encounter (Signed)
Please advise 

## 2021-07-07 ENCOUNTER — Telehealth: Payer: Self-pay | Admitting: Podiatry

## 2021-07-07 LAB — URINE CULTURE
MICRO NUMBER:: 13439813
SPECIMEN QUALITY:: ADEQUATE

## 2021-07-07 MED ORDER — SULFAMETHOXAZOLE-TRIMETHOPRIM 800-160 MG PO TABS: 1.0000 | ORAL_TABLET | Freq: Two times a day (BID) | ORAL | 0 refills | Status: DC

## 2021-07-07 NOTE — Telephone Encounter (Signed)
Patient has worn Hoka's for a long time now.  Recommend open toe Hoka's/sandals or a visit to [employer] [facility] store for shoe fitting.  Thanks, Dr. Amalia Hailey

## 2021-07-07 NOTE — Telephone Encounter (Signed)
Bil great toe pain almost constantly esp when anything touches, sometimes burning sensation when something toes it. Having some swelling as well. She has hx of neuropathy. Have bought new hokas and cannot find shoes that fit properly that have arch supports. What would you recommend

## 2021-07-07 NOTE — Addendum Note (Signed)
Addended by: Loura Pardon A on: 07/07/2021 02:42 PM   Modules accepted: Orders

## 2021-07-11 NOTE — Telephone Encounter (Signed)
Called pts home and work number and neither would go thru. Numbers not in service.

## 2021-07-12 NOTE — Telephone Encounter (Signed)
Pt called asking about coming in on Friday to see Dr Amalia Hailey but he is full. I did schedule her for next week. She states she went to [employer] and got the new shoes and it is causing burning at the base of the toenails on both feet but left is worse. She stated she has some toe covers(Toe caps) I think and I told her to try them and then they can discuss at her appt next week. I also tried calling pt back and my call cannot be complete at this time is the message I get.

## 2021-07-18 ENCOUNTER — Ambulatory Visit: Payer: Medicare PPO | Admitting: Podiatry

## 2021-07-18 DIAGNOSIS — M778 Other enthesopathies, not elsewhere classified: Secondary | ICD-10-CM

## 2021-07-18 NOTE — Progress Notes (Signed)
HPI: 55 y.o. female PMHx chronic foot pain presenting today for recommendations regarding different foot lotions.  Yesterday she also says that she spent $2000 for off-the-shelf arch supports at the [employer] [facility].  She would like to know recommendations on the arch supports as well.  She says that she was putting some ammonium lactate lotion on her feet but it caused them to be red.  This was about 3-4 weeks ago.  She has since discontinued and the redness has resolved.  Currently she has a bottle of urea 32% plus salicylic acid lotion and would like to know recommendations on this.  Past Medical History:  Diagnosis Date   Allergy    allergic rhinitis   Anemia    Anxiety    Arthritis    osteoarthritis   Asthma    Claustrophobia    does not like oxygen mask covering face   Depression    Diabetes mellitus without complication ([place])    Fall 2016   Fatty liver 07/2008   abd. ultrasound - fatty liver ; slt dilated cbd (no stones ) 06/10// abd.  ultrasound normal on 04/2006   Fibromyalgia    GERD (gastroesophageal reflux disease)    EGD negative 06/2001// EGD erythematous mucosa, polyp 08/2008   High uric acid in 24 hour urine specimen    Hyperhidrosis    Hyperlipidemia    Kidney stones    Lymphedema    Obesity    PCOS (polycystic ovarian syndrome)    Shortness of breath dyspnea    Tachycardia    TMJ (dislocation of temporomandibular joint)    UTI (lower urinary tract infection)     Past Surgical History:  Procedure Laterality Date   BREAST BIOPSY Right 2016   neg   BREAST LUMPECTOMY WITH RADIOACTIVE SEED LOCALIZATION Right 08/02/2014   Procedure: BREAST LUMPECTOMY WITH RADIOACTIVE SEED LOCALIZATION;  Surgeon: Rolm Bookbinder, MD;  Location: [facility];  Service: General;  Laterality: Right;   COLONOSCOPY  08/12/2012   Completed by Dr. Phylis Bougie, normal exam.    ENDOMETRIAL BIOPSY  01/2004   ESOPHAGOGASTRODUODENOSCOPY     ESOPHAGOGASTRODUODENOSCOPY (EGD) WITH PROPOFOL N/A  08/18/2019   Procedure: ESOPHAGOGASTRODUODENOSCOPY (EGD) WITH PROPOFOL;  Surgeon: Lucilla Lame, MD;  Location: [facility] ENDOSCOPY;  Service: Endoscopy;  Laterality: N/A;   FOOT SURGERY     SEPTOPLASTY     two times   TONSILLECTOMY     UTERINE FIBROID SURGERY  06/2005   ablation   uterine tumor  09/2001   VAGUS NERVE STIMULATOR INSERTION      Allergies  Allergen Reactions   Cephalexin    Codeine     rash   Duloxetine    Levaquin [Levofloxacin] Nausea Only   Lisinopril Cough   Metformin And Related Diarrhea    GI side effects    Quetiapine Other (See Comments)   Sertraline Hcl     REACTION: vivid dreams   Triazolam     REACTION: ? reaction   Zaleplon    Zocor [Simvastatin - High Dose]     achey   Zolpidem Tartrate     hallucinations   Hydrocodone Itching and Rash    REACTION: redness rash itching   Norelgestromin-Eth Estradiol     Patch didn't stick to skin     Physical Exam: General: The patient is alert and oriented x3 in no acute distress.  Dermatology: Skin is warm, dry and supple bilateral lower extremities. Negative for open lesions or macerations.  There is some diffuse  hyperkeratosis of skin and callus formation.  Vascular: Palpable pedal pulses bilaterally. Capillary refill within normal limits.  Negative for any significant edema or erythema  Neurological: Light touch and protective threshold grossly intact  Musculoskeletal Exam: No pedal deformities noted.  There is some pain and sensitivity associated to the distal tips of the great toes bilateral  Assessment: 1.  Chronic bilateral foot pain 2.  Diffuse hyperkeratosis of skin   Plan of Care:  1. Patient evaluated.  2.  Recommend urea 40% foot lotion with salicylic acid.  Okay to apply daily 3.  Continue the off-the-shelf arch supports from [employer] [facility].  Although I generally do not recommend spending $2000 on arch supports, there nonrefundable and she has them so she is more than welcome to try and see  if this alleviates any of her symptoms or pain although I highly doubt it. 4.  Return to clinic next scheduled appointment appointment for routine foot care     Edrick Kins, DPM [facility]  Dr. Edrick Kins, DPM    [address], [place] [address]                Office [phone]  Fax [phone]

## 2021-07-21 DIAGNOSIS — M9903 Segmental and somatic dysfunction of lumbar region: Secondary | ICD-10-CM | POA: Diagnosis not present

## 2021-07-21 DIAGNOSIS — M9904 Segmental and somatic dysfunction of sacral region: Secondary | ICD-10-CM | POA: Diagnosis not present

## 2021-07-21 DIAGNOSIS — M9905 Segmental and somatic dysfunction of pelvic region: Secondary | ICD-10-CM | POA: Diagnosis not present

## 2021-07-21 DIAGNOSIS — M5431 Sciatica, right side: Secondary | ICD-10-CM | POA: Diagnosis not present

## 2021-07-28 ENCOUNTER — Telehealth: Payer: Self-pay | Admitting: Podiatry

## 2021-07-28 NOTE — Telephone Encounter (Signed)
Pt called and went and had a message yesterday and when the massage therapist was massaging her feet she was having a lot of pain all over. She said the therapist she has inflammation in her feet and she wants to know what to do for that.

## 2021-08-01 NOTE — Telephone Encounter (Signed)
Recommend conservative treatment.  Recommend rest, ice, elevation, and compression.  Continue wearing good shoes that she has.  Please inform patient.  Thanks, Dr. Amalia Hailey

## 2021-08-01 NOTE — Telephone Encounter (Signed)
Spoke with patient and gave instructions per dr Amalia Hailey.

## 2021-08-03 DIAGNOSIS — H6123 Impacted cerumen, bilateral: Secondary | ICD-10-CM | POA: Diagnosis not present

## 2021-08-03 DIAGNOSIS — H902 Conductive hearing loss, unspecified: Secondary | ICD-10-CM | POA: Diagnosis not present

## 2021-08-07 ENCOUNTER — Other Ambulatory Visit: Payer: Self-pay | Admitting: Family Medicine

## 2021-08-09 ENCOUNTER — Other Ambulatory Visit: Payer: Self-pay | Admitting: Family Medicine

## 2021-08-09 NOTE — Telephone Encounter (Signed)
Refill request Albuterol Last refill 06/15/20  18 G/3 refills Last office visit 07/05/21

## 2021-08-17 ENCOUNTER — Ambulatory Visit: Payer: Medicare PPO | Admitting: Dermatology

## 2021-08-17 DIAGNOSIS — L820 Inflamed seborrheic keratosis: Secondary | ICD-10-CM

## 2021-08-17 DIAGNOSIS — L853 Xerosis cutis: Secondary | ICD-10-CM | POA: Diagnosis not present

## 2021-08-17 DIAGNOSIS — R52 Pain, unspecified: Secondary | ICD-10-CM | POA: Diagnosis not present

## 2021-08-17 DIAGNOSIS — L689 Hypertrichosis, unspecified: Secondary | ICD-10-CM | POA: Diagnosis not present
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
## 2021-08-17 NOTE — Patient Instructions (Addendum)
For pain in toes Recommend compression socks, shoes that do not constrict Discussed otc Voltaren cream, topical lidocaine cream  Restart the Ammonium lactate 12% to feet and legs daily  Cryotherapy Aftercare  Wash gently with soap and water everyday.   Apply Vaseline and Band-Aid daily until healed.    Due to recent changes in healthcare laws, you may see results of your pathology and/or laboratory studies on MyChart before the doctors have had a chance to review them. We understand that in some cases there may be results that are confusing or concerning to you. Please understand that not all results are received at the same time and often the doctors may need to interpret multiple results in order to provide you with the best plan of care or course of treatment. Therefore, we ask that you please give [place] 2 business days to thoroughly review all your results before contacting the office for clarification. Should we see a critical lab result, you will be contacted sooner.   If You Need Anything After Your Visit  If you have any questions or concerns for your doctor, please call our main line at [phone] and press option 4 to reach your doctor's medical assistant. If no one answers, please leave a voicemail as directed and we will return your call as soon as possible. Messages left after 4 pm will be answered the following business day.   You may also send [place] a message via [place]. We typically respond to MyChart messages within 1-2 business days.  For prescription refills, please ask your pharmacy to contact our office. Our fax number is [phone].  If you have an urgent issue when the clinic is closed that cannot wait until the next business day, you can page your doctor at the number below.    Please note that while we do our best to be available for urgent issues outside of office hours, we are not available 24/7.   If you have an urgent issue and are unable to reach [place], you may  choose to seek medical care at your doctor's office, retail clinic, urgent care center, or emergency room.  If you have a medical emergency, please immediately call 911 or go to the emergency department.  Pager Numbers  - Dr. Nehemiah Massed: [phone]  - Dr. Laurence Ferrari: [phone]  - Dr. Nicole Kindred: [phone]  In the event of inclement weather, please call our main line at [phone] for an update on the status of any delays or closures.  Dermatology Medication Tips: Please keep the boxes that topical medications come in in order to help keep track of the instructions about where and how to use these. Pharmacies typically print the medication instructions only on the boxes and not directly on the medication tubes.   If your medication is too expensive, please contact our office at [phone] option 4 or send [place] a message through [place].   We are unable to tell what your co-pay for medications will be in advance as this is different depending on your insurance coverage. However, we may be able to find a substitute medication at lower cost or fill out paperwork to get insurance to cover a needed medication.   If a prior authorization is required to get your medication covered by your insurance company, please allow [place] 1-2 business days to complete this process.  Drug prices often vary depending on where the prescription is filled and some pharmacies may offer cheaper prices.  The website www.goodrx.com contains coupons for medications  through different pharmacies. The prices here do not account for what the cost may be with help from insurance (it may be cheaper with your insurance), but the website can give you the price if you did not use any insurance.  - You can print the associated coupon and take it with your prescription to the pharmacy.  - You may also stop by our office during regular business hours and pick up a GoodRx coupon card.  - If you need your prescription sent  electronically to a different pharmacy, notify our office through [facility] or by phone at [phone] option 4.     Si Usted Necesita Algo Despus de Su Visita  Tambin puede enviarnos un mensaje a travs de Pharmacist, community. Por lo general respondemos a los mensajes de MyChart en el transcurso de 1 a 2 das hbiles.  Para renovar recetas, por favor pida a su farmacia que se ponga en contacto con nuestra oficina. Harland Dingwall de fax es [place] [phone].  Si tiene un asunto urgente cuando la clnica est cerrada y que no puede esperar hasta el siguiente da hbil, puede llamar/localizar a su doctor(a) al nmero que aparece a continuacin.   Por favor, tenga en cuenta que aunque hacemos todo lo posible para estar disponibles para asuntos urgentes fuera del horario de [place], no estamos disponibles las 24 horas del da, los 7 das de la [place].   Si tiene un problema urgente y no puede comunicarse con nosotros, puede optar por buscar atencin mdica  en el consultorio de su doctor(a), en una clnica privada, en un centro de atencin urgente o en una sala de emergencias.  Si tiene Engineering geologist, por favor llame inmediatamente al 911 o vaya a la sala de emergencias.  Nmeros de bper  - Dr. Nehemiah Massed: [phone]  - Dra. Moye: [phone]  - Dra. Nicole Kindred: [phone]  En caso de inclemencias del [place], por favor llame a Johnsie Kindred principal al [phone] para una actualizacin sobre el [place] de cualquier retraso o cierre.  Consejos para la medicacin en dermatologa: Por favor, guarde las cajas en las que vienen los medicamentos de uso tpico para ayudarle a seguir las instrucciones sobre dnde y cmo usarlos. Las farmacias generalmente imprimen las instrucciones del medicamento slo en las cajas y no directamente en los tubos del [place].   Si su medicamento es muy caro, por favor, pngase en contacto con Zigmund Daniel llamando al [phone] y presione la  opcin 4 o envenos un mensaje a travs de Pharmacist, community.   No podemos decirle cul ser su copago por los medicamentos por adelantado ya que esto es diferente dependiendo de la cobertura de su seguro. Sin embargo, es posible que podamos encontrar un medicamento sustituto a Electrical engineer un formulario para que el seguro cubra el medicamento que se considera necesario.   Si se requiere una autorizacin previa para que su compaa de seguros [place] su medicamento, por favor permtanos de 1 a 2 das hbiles para completar este proceso.  Los precios de los medicamentos varan con frecuencia dependiendo del Environmental consultant de dnde se surte la receta y alguna farmacias pueden ofrecer precios ms baratos.  El sitio web www.goodrx.com tiene cupones para medicamentos de Airline pilot. Los precios aqu no tienen en cuenta lo que podra costar con la ayuda del seguro (puede ser ms barato con su seguro), pero el sitio web puede darle el precio si no utiliz Research scientist (physical sciences).  - Puede imprimir el cupn correspondiente y llevarlo  con su receta a la farmacia.  - Tambin puede pasar por nuestra oficina durante el horario de atencin regular y Charity fundraiser una tarjeta de cupones de GoodRx.  - Si necesita que su receta se enve electrnicamente a una farmacia diferente, informe a nuestra oficina a travs de MyChart de [place] o por telfono llamando al [phone] y presione la opcin 4.

## 2021-08-22 ENCOUNTER — Telehealth: Payer: Self-pay

## 2021-08-22 ENCOUNTER — Encounter: Payer: Self-pay | Admitting: Podiatry

## 2021-08-22 NOTE — Telephone Encounter (Signed)
Pt called asking how to take care of blisters, after care info for LN2 procedure, discussed with pt she should apply plain vaseline daily

## 2021-08-23 ENCOUNTER — Telehealth: Payer: Self-pay | Admitting: Family Medicine

## 2021-08-23 DIAGNOSIS — E119 Type 2 diabetes mellitus without complications: Secondary | ICD-10-CM

## 2021-08-23 NOTE — Telephone Encounter (Signed)
Received a call from schedulers who handles [place], stating that the patient was crying and talking about being depressed. Spoke with patient and offered an appointment but Mackenzie Bonilla declined stating she would like a referral to psychologist who specializes in food. Believes that the depression is caused from her being overweight and wanting to lose weight, but has an unhealthy relationship with food. She stated she has discussed this with Dr. Glori Bickers.

## 2021-08-23 NOTE — Telephone Encounter (Signed)
Has [facility] tried [facility] have office in [place] and getting one [place] that just option

## 2021-08-23 NOTE — Telephone Encounter (Signed)
I have not been able to find any counselors that specialize in eating issues that take new patients so far but continue to look.  [facility] but I'm unsure how long the wait list is right now. Happy to do a referral if she is interested. 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 Any support would be great

## 2021-08-24 ENCOUNTER — Encounter: Payer: Medicare PPO | Admitting: Student in an Organized Health Care Education/Training Program

## 2021-08-24 ENCOUNTER — Ambulatory Visit: Payer: Medicare PPO | Admitting: Student in an Organized Health Care Education/Training Program

## 2021-08-24 NOTE — Telephone Encounter (Signed)
Called and spoke with patient she is willing to go with referral , I did explain to patient that their office is in [place] and she will need to there for her visits. Pt understood this .

## 2021-08-24 NOTE — Addendum Note (Signed)
Addended by: Loura Pardon A on: 08/24/2021 05:47 PM   Modules accepted: Orders

## 2021-08-24 NOTE — Telephone Encounter (Signed)
I sent referral  Please give her the [facility] so she can find out how long the wait list is  Thanks

## 2021-08-25 ENCOUNTER — Ambulatory Visit (INDEPENDENT_AMBULATORY_CARE_PROVIDER_SITE_OTHER): Payer: Medicare PPO

## 2021-08-25 ENCOUNTER — Telehealth: Payer: Self-pay

## 2021-08-25 ENCOUNTER — Other Ambulatory Visit: Payer: Self-pay | Admitting: Nurse Practitioner

## 2021-08-25 ENCOUNTER — Ambulatory Visit: Payer: Medicare PPO | Admitting: Nurse Practitioner

## 2021-08-25 VITALS — BP 104/62 | HR 73 | Temp 97.2°F | Resp 12 | Wt 286.2 lb

## 2021-08-25 VITALS — Ht 64.0 in | Wt 286.0 lb

## 2021-08-25 DIAGNOSIS — Z0000 Encounter for general adult medical examination without abnormal findings: Secondary | ICD-10-CM | POA: Diagnosis not present

## 2021-08-25 DIAGNOSIS — N3091 Cystitis, unspecified with hematuria: Secondary | ICD-10-CM

## 2021-08-25 DIAGNOSIS — R350 Frequency of micturition: Secondary | ICD-10-CM

## 2021-08-25 DIAGNOSIS — R1084 Generalized abdominal pain: Secondary | ICD-10-CM

## 2021-08-25 DIAGNOSIS — K5900 Constipation, unspecified: Secondary | ICD-10-CM | POA: Diagnosis not present

## 2021-08-25 LAB — COMPREHENSIVE METABOLIC PANEL
ALT: 15 U/L (ref 0–35)
AST: 17 U/L (ref 0–37)
Albumin: 4.4 g/dL (ref 3.5–5.2)
Alkaline Phosphatase: 72 U/L (ref 39–117)
BUN: 17 mg/dL (ref 6–23)
CO2: 29 mEq/L (ref 19–32)
Calcium: 10 mg/dL (ref 8.4–10.5)
Chloride: 101 mEq/L (ref 96–112)
Creatinine, Ser: 1.04 mg/dL (ref 0.40–1.20)
GFR: 60.26 mL/min (ref >60.00)
Glucose, Bld: 100 mg/dL — ABNORMAL HIGH (ref 70–99)
Potassium: 4 mEq/L (ref 3.5–5.1)
Sodium: 137 mEq/L (ref 135–145)
Total Bilirubin: 0.3 mg/dL (ref 0.2–1.2)
Total Protein: 7.3 g/dL (ref 6.0–8.3)

## 2021-08-25 LAB — CBC
HCT: 42 % (ref 36.0–46.0)
Hemoglobin: 13.9 g/dL (ref 12.0–15.0)
MCHC: 33.2 g/dL (ref 30.0–36.0)
MCV: 96.4 fl (ref 78.0–100.0)
Platelets: 231 10*3/uL (ref 150.0–400.0)
RBC: 4.36 Mil/uL (ref 3.87–5.11)
RDW: 13.9 % (ref 11.5–15.5)
WBC: 8.9 10*3/uL (ref 4.0–10.5)

## 2021-08-25 LAB — POC URINALSYSI DIPSTICK (AUTOMATED)
Bilirubin, UA: NEGATIVE
Blood, UA: POSITIVE
Glucose, UA: NEGATIVE
Ketones, UA: NEGATIVE
Nitrite, UA: NEGATIVE
Protein, UA: NEGATIVE
Spec Grav, UA: 1.025 (ref 1.010–1.025)
Urobilinogen, UA: 0.2 E.U./dL
pH, UA: 5.5 (ref 5.0–8.0)

## 2021-08-25 LAB — LIPASE: Lipase: 34 U/L (ref 11.0–59.0)

## 2021-08-25 MED ORDER — NITROFURANTOIN MONOHYD MACRO 100 MG PO CAPS: 100.0000 mg | ORAL_CAPSULE | Freq: Two times a day (BID) | ORAL | 0 refills | Status: DC

## 2021-08-25 NOTE — Progress Notes (Signed)
Acute Office Visit  Subjective:     Patient ID: Mackenzie Bonilla, female    DOB: [birth year], 56 y.o.   MRN: [id number]  Chief Complaint  Patient presents with   Urinary Frequency    Started about 2 days ago at night-frequency, had very dark urine earlier today-looked like had blood in it, lower left back pain, fatigue, did not sleep well and hot nightmares but realized she did not take her medication for sleep as she usually does.      Patient is in today for urinary complaints  Urinary symptoms started approx 2 up every hour urinatinating. State that she had the urge and would go very little and sometimes it would have a normal amount. States that yesterday she started the day like that. States that the night beofre she was having nightmares all the night. States that she forgot to take her nightime medicin  States that she felt poor yesterday and had to cancel that appointment yesterday (pain clinic)  States that when she woke up this morning the urine was dark and she felt like there was blood in it. No blood on the toliet paper or pad  Review of Systems  Constitutional:  Positive for chills and malaise/fatigue. Negative for fever.  Gastrointestinal:  Negative for nausea and vomiting.  Genitourinary:  Positive for frequency and hematuria. Negative for flank pain.  Musculoskeletal:  Positive for back pain.        Objective:    BP 104/62   Pulse 73   Temp (!) 97.2 F (36.2 C)   Resp 12   Wt 286 lb 4 oz (129.8 kg)   SpO2 97%   BMI 49.13 kg/m  BP Readings from Last 3 Encounters:  08/25/21 104/62  07/05/21 126/64  06/27/21 104/72   Wt Readings from Last 3 Encounters:  08/25/21 286 lb (129.7 kg)  08/25/21 286 lb 4 oz (129.8 kg)  07/05/21 284 lb 2 oz (128.9 kg)      Physical Exam Vitals and nursing note reviewed.  Constitutional:      Appearance: Normal appearance. She is obese.  Cardiovascular:     Rate and Rhythm: Normal rate and regular rhythm.     Heart  sounds: Normal heart sounds.  Pulmonary:     Effort: Pulmonary effort is normal.     Breath sounds: Normal breath sounds.  Abdominal:     General: Bowel sounds are normal. There is no distension.     Palpations: There is no mass.     Tenderness: There is abdominal tenderness. There is no right CVA tenderness or left CVA tenderness.     Hernia: No hernia is present.  Musculoskeletal:        General: Tenderness present.       Arms:  Skin:    General: Skin is warm.  Neurological:     Mental Status: She is alert.     Results for orders placed or performed in visit on 08/25/21  CBC  Result Value Ref Range   WBC 8.9 4.0 - 10.5 K/uL   RBC 4.36 3.87 - 5.11 Mil/uL   Platelets 231.0 150.0 - 400.0 K/uL   Hemoglobin 13.9 12.0 - 15.0 g/dL   HCT 42.0 36.0 - 46.0 %   MCV 96.4 78.0 - 100.0 fl   MCHC 33.2 30.0 - 36.0 g/dL   RDW 13.9 11.5 - 15.5 %  Lipase  Result Value Ref Range   Lipase 34.0 11.0 - 59.0 U/L  Comprehensive metabolic  panel  Result Value Ref Range   Sodium 137 135 - 145 mEq/L   Potassium 4.0 3.5 - 5.1 mEq/L   Chloride 101 96 - 112 mEq/L   CO2 29 19 - 32 mEq/L   Glucose, Bld 100 (H) 70 - 99 mg/dL   BUN 17 6 - 23 mg/dL   Creatinine, Ser 1.04 0.40 - 1.20 mg/dL   Total Bilirubin 0.3 0.2 - 1.2 mg/dL   Alkaline Phosphatase 72 39 - 117 U/L   AST 17 0 - 37 U/L   ALT 15 0 - 35 U/L   Total Protein 7.3 6.0 - 8.3 g/dL   Albumin 4.4 3.5 - 5.2 g/dL   GFR 60.26 >60.00 mL/min   Calcium 10.0 8.4 - 10.5 mg/dL  POCT Urinalysis Dipstick (Automated)  Result Value Ref Range   Color, UA dark orange    Clarity, UA cloudy    Glucose, UA Negative Negative   Bilirubin, UA negative    Ketones, UA negative    Spec Grav, UA 1.025 1.010 - 1.025   Blood, UA positive- 3+    pH, UA 5.5 5.0 - 8.0   Protein, UA Negative Negative   Urobilinogen, UA 0.2 0.2 or 1.0 E.U./dL   Nitrite, UA negative    Leukocytes, UA Moderate (2+) (A) Negative        Assessment & Plan:   Problem List Items  Addressed This Visit       Genitourinary   Cystitis with hematuria    Presentation and UA positive for blood and leukocytes will elect to treat with Macrobid 100 mg twice daily for 5 days.  Pending urinary culture        Other   Constipation    Patient still dealing with constipation.  She is on [facility] which likely could worsen the patient's presentation.  Did advise she can use over-the-counter stool softeners she can also use over-the-counter laxatives for faster result this will result in cramping and diarrhea likely.  Looking in the chart it was seen that she may be a candidate for Linzess but patient states she does not want to take anything daily.  She has tried [place] but states she is unable to tolerate full doses of MiraLAX every day      Generalized abdominal pain    View last abdominal x-ray that showed stool burden and possible nephrolithiasis on left side.  Patient is presenting atypical for kidney stone presentation.  She did have hematuria albeit microscopic.  At the time this has been closed lab work has been reviewed and was all normal      Relevant Orders   CBC (Completed)   Lipase (Completed)   Comprehensive metabolic panel (Completed)   Urinary frequency - Primary   Relevant Orders   POCT Urinalysis Dipstick (Automated) (Completed)   Urine Culture    No orders of the defined types were placed in this encounter.   No follow-ups on file.  Romilda Garret, NP

## 2021-08-25 NOTE — Progress Notes (Signed)
I connected with Mackenzie Bonilla today by telephone and verified that I am speaking with the correct person using two identifiers. Location patient: home Location provider: work Persons participating in the virtual visit: Mackenzie Bonilla, Mackenzie Durand LPN.   I discussed the limitations, risks, security and privacy concerns of performing an evaluation and management service by telephone and the availability of in person appointments. I also discussed with the patient that there may be a patient responsible charge related to this service. The patient expressed understanding and verbally consented to this telephonic visit.    Interactive audio and video telecommunications were attempted between this provider and patient, however failed, due to patient having technical difficulties OR patient did not have access to video capability.  We continued and completed visit with audio only.     Vital signs may be patient reported or missing.  Subjective:   Mackenzie Bonilla is a 56 y.o. female who presents for an Initial Medicare Annual Wellness Visit.  Review of Systems     Cardiac Risk Factors include: diabetes mellitus;dyslipidemia;obesity (BMI >30kg/m2)     Objective:    Today's Vitals   08/25/21 1541 08/25/21 1542  Weight: 286 lb (129.7 kg)   Height: 5' 4"  (1.626 m)   PainSc:  5    Body mass index is 49.09 kg/m.     08/25/2021    3:50 PM 08/04/2020   11:27 AM 04/20/2020    1:55 PM 01/04/2020   10:52 AM 12/29/2019    8:41 AM 12/08/2019    2:09 PM 08/18/2019    8:00 AM  Advanced Directives  Does Patient Have a Medical Advance Directive? No No No No No No No  Does patient want to make changes to medical advance directive?    Yes (MAU/Ambulatory/Procedural Areas - Information given)     Would patient like information on creating a medical advance directive?   No - Patient declined  No - Patient declined No - Patient declined No - Patient declined    Current Medications  (verified) Outpatient Encounter Medications as of 08/25/2021  Medication Sig   acetaminophen (TYLENOL) 500 MG tablet Take 500 mg by mouth every 6 (six) hours as needed.    albuterol (VENTOLIN HFA) 108 (90 Base) MCG/ACT inhaler INHALE 2 PUFFS INTO THE LUNGS EVERY 6 HOURS AS NEEDED FOR WHEEZING.   ALPRAZolam (XANAX) 1 MG tablet Take 2 mg by mouth 3 (three) times daily as needed.   ammonium lactate (AMLACTIN) 12 % cream Apply 1 application. topically in the morning and at bedtime. For dry skin   Blood Glucose Monitoring Suppl (FIFTY50 GLUCOSE METER 2.0) w/Device KIT Use as directed   buPROPion (WELLBUTRIN XL) 150 MG 24 hr tablet Take 150 mg by mouth daily.   Cholecalciferol (VITAMIN D3 PO) Take 2,000 Units by mouth daily.   clindamycin (CLEOCIN-T) 1 % lotion Apply topically daily. Qd to bumps on face and chest   cyclobenzaprine (FLEXERIL) 5 MG tablet Take 1 tablet (5 mg total) by mouth 3 (three) times daily as needed for muscle spasms.   desvenlafaxine (PRISTIQ) 100 MG 24 hr tablet Take 100 mg by mouth daily.   dimenhyDRINATE (DRAMAMINE) 50 MG tablet Take 50 mg by mouth every 8 (eight) hours as needed.   Docusate Calcium (STOOL SOFTENER PO) Take 1-2 tablets by mouth daily as needed.   fluticasone (FLONASE) 50 MCG/ACT nasal spray Place into both nostrils daily.   gabapentin (NEURONTIN) 100 MG capsule TAKE 2 CAPSULES BY MOUTH EVERY MORNING AND 3  CAPSULES EVERY EVENING   ibuprofen (ADVIL) 600 MG tablet Take 1 tablet (600 mg total) by mouth every 8 (eight) hours as needed.   Insulin Degludec (TRESIBA [place]) Inject into the skin. 1 injection nightly 8-10 pm   ketoconazole (NIZORAL) 2 % cream Apply 1 application topically daily.   losartan (COZAAR) 25 MG tablet    nystatin cream (MYCOSTATIN) APPLY TO AFFECTED AREAS 3 TIMES DAILY ASNEEDED   omeprazole (PRILOSEC) 20 MG capsule TAKE 1 CAPSULE BY MOUTH 2 TIMES DAILY.   OVER THE COUNTER MEDICATION Take 1 capsule by mouth daily. BARIATRIC MULTIVITAMIN IRON FREE    oxybutynin (DITROPAN XL) 15 MG 24 hr tablet TAKE 1 TABLET BY MOUTH DAILY   Polyethyl Glycol-Propyl Glycol (SYSTANE OP) Apply 1 drop to eye daily as needed.   pravastatin (PRAVACHOL) 20 MG tablet 1 TABLET BY MOUTH AT BEDTIME.   psyllium (METAMUCIL) 58.6 % powder Take 1 packet by mouth 2 (two) times daily.   Simethicone (GAS-X PO) Take by mouth as needed.   spironolactone (ALDACTONE) 100 MG tablet TAKE 1 TABLET BY MOUTH DAILY   tirzepatide (MOUNJARO) 7.5 MG/0.5ML Pen Inject 10 mg into the skin once a week.   traMADol (ULTRAM) 50 MG tablet TAKE ONE TABLET TWICE A DAY IF NEEDED FOR SEVERE PAIN   traZODone (DESYREL) 150 MG tablet Take 300 mg by mouth at bedtime.    zinc oxide (BALMEX) 11.3 % CREA cream Apply 1 application topically 2 (two) times daily.   No facility-administered encounter medications on file as of 08/25/2021.    Allergies (verified) Cephalexin, Codeine, Duloxetine, Levaquin [levofloxacin], Lisinopril, Metformin and related, Quetiapine, Sertraline hcl, Triazolam, Zaleplon, Zocor [simvastatin - high dose], Zolpidem tartrate, Hydrocodone, and Norelgestromin-eth estradiol   History: Past Medical History:  Diagnosis Date   Allergy    allergic rhinitis   Anemia    Anxiety    Arthritis    osteoarthritis   Asthma    Claustrophobia    does not like oxygen mask covering face   Depression    Diabetes mellitus without complication ([place])    Fall 2016   Fatty liver 07/2008   abd. ultrasound - fatty liver ; slt dilated cbd (no stones ) 06/10// abd.  ultrasound normal on 04/2006   Fibromyalgia    GERD (gastroesophageal reflux disease)    EGD negative 06/2001// EGD erythematous mucosa, polyp 08/2008   High uric acid in 24 hour urine specimen    Hyperhidrosis    Hyperlipidemia    Kidney stones    Lymphedema    Obesity    PCOS (polycystic ovarian syndrome)    Shortness of breath dyspnea    Tachycardia    TMJ (dislocation of temporomandibular joint)    UTI (lower urinary  tract infection)    Past Surgical History:  Procedure Laterality Date   BREAST BIOPSY Right 2016   neg   BREAST LUMPECTOMY WITH RADIOACTIVE SEED LOCALIZATION Right 08/02/2014   Procedure: BREAST LUMPECTOMY WITH RADIOACTIVE SEED LOCALIZATION;  Surgeon: Rolm Bookbinder, MD;  Location: [facility];  Service: General;  Laterality: Right;   COLONOSCOPY  08/12/2012   Completed by Dr. Phylis Bougie, normal exam.    ENDOMETRIAL BIOPSY  01/2004   ESOPHAGOGASTRODUODENOSCOPY     ESOPHAGOGASTRODUODENOSCOPY (EGD) WITH PROPOFOL N/A 08/18/2019   Procedure: ESOPHAGOGASTRODUODENOSCOPY (EGD) WITH PROPOFOL;  Surgeon: Lucilla Lame, MD;  Location: [facility] ENDOSCOPY;  Service: Endoscopy;  Laterality: N/A;   FOOT SURGERY     SEPTOPLASTY     two times   TONSILLECTOMY  UTERINE FIBROID SURGERY  06/2005   ablation   uterine tumor  09/2001   VAGUS NERVE STIMULATOR INSERTION     Family History  Problem Relation Age of Onset   Hypertension Father    Cancer Father        pancreatic cancer   Hypertension Sister    Heart disease Maternal Grandmother 10       MI   Diabetes Maternal Grandmother    Heart disease Paternal Grandmother 1       MI   Diabetes Paternal Grandmother    Breast cancer Neg Hx    Social History   Socioeconomic History   Marital status: Single    Spouse name: Not on file   Number of children: Not on file   Years of education: Not on file   Highest education level: Not on file  Occupational History   Not on file  Tobacco Use   Smoking status: Never   Smokeless tobacco: Never  Vaping Use   Vaping Use: Never used  Substance and Sexual Activity   Alcohol use: No    Alcohol/week: 0.0 standard drinks of alcohol   Drug use: No   Sexual activity: Not Currently    Birth control/protection: None, Post-menopausal  Other Topics Concern   Not on file  Social History Narrative   Not on file   Social Determinants of Health   Financial Resource Strain: Low Risk  (08/25/2021)   Overall Financial  Resource Strain (CARDIA)    Difficulty of Paying Living Expenses: Not hard at all  Food Insecurity: No Food Insecurity (08/25/2021)   Hunger Vital Sign    Worried About Running Out of Food in the Last Year: Never true    Ran Out of Food in the Last Year: Never true  Transportation Needs: No Transportation Needs (08/25/2021)   PRAPARE - Hydrologist (Medical): No    Lack of Transportation (Non-Medical): No  Physical Activity: Inactive (08/25/2021)   Exercise Vital Sign    Days of Exercise per Week: 0 days    Minutes of Exercise per Session: 0 min  Stress: Not on file  Social Connections: Not on file    Tobacco Counseling Counseling given: Not Answered   Clinical Intake:  Pre-visit preparation completed: Yes  Pain : 0-10 Pain Score: 5  Pain Type: Chronic pain Pain Location: Back Pain Orientation: Lower, Mid Pain Descriptors / Indicators: Aching Pain Onset: More than a month ago Pain Frequency: Constant     Nutritional Status: BMI > 30  Obese Nutritional Risks: None Diabetes: Yes  How often do you need to have someone help you when you read instructions, pamphlets, or other written materials from your doctor or pharmacy?: 1 - Never What is the last grade level you completed in school?: college  Diabetic?yes Nutrition Risk Assessment:  Has the patient had any N/V/D within the last 2 months?  No  Does the patient have any non-healing wounds?  No  Has the patient had any unintentional weight loss or weight gain?  No   Diabetes:  Is the patient diabetic?  Yes  If diabetic, was a CBG obtained today?  No  Did the patient bring in their glucometer from home?  No  How often do you monitor your CBG's? Does not.   Financial Strains and Diabetes Management:  Are you having any financial strains with the device, your supplies or your medication? No .  Does the patient want to be seen  by Chronic Care Management for management of their diabetes?   No  Would the patient like to be referred to a Nutritionist or for Diabetic Management?  No   Diabetic Exams:  Diabetic Eye Exam: Completed 01/25/2021 Diabetic Foot Exam: Overdue, Pt has been advised about the importance in completing this exam. Pt is scheduled for diabetic foot exam on next appointment.   Interpreter Needed?: No  Information entered by :: NAllen LPN   Activities of Daily Living    08/25/2021    3:55 PM  In your present state of health, do you have any difficulty performing the following activities:  Hearing? 0  Vision? 0  Difficulty concentrating or making decisions? 1  Walking or climbing stairs? 1  Dressing or bathing? 0  Doing errands, shopping? 0  Preparing Food and eating ? N  Using the Toilet? N  In the past six months, have you accidently leaked urine? Y  Do you have problems with loss of bowel control? N  Managing your Medications? N  Managing your Finances? N  Housekeeping or managing your Housekeeping? N    Patient Care Team: Tower, Wynelle Fanny, MD as PCP - General (Family Medicine)  Indicate any recent Medical Services you may have received from other than [facility] providers in the past year (date may be approximate).     Assessment:   This is a routine wellness examination for Chandy.  Hearing/Vision screen Vision Screening - Comments:: Regular eye exams, [facility]  Dietary issues and exercise activities discussed: Current Exercise Habits: The patient does not participate in regular exercise at present   Goals Addressed             This Visit's Progress    Patient Stated       08/25/2021, wants to lose weight       Depression Screen    08/25/2021    3:52 PM 08/10/2020   11:17 AM 08/04/2020   11:26 AM 05/09/2020    8:06 AM 04/20/2020    1:53 PM 03/09/2020    9:07 AM 02/03/2020   10:32 AM  PHQ 2/9 Scores  PHQ - 2 Score 6 6 0 0 3 0 0  PHQ- 9 Score 14 24   15       Fall Risk    08/25/2021    3:51 PM 06/27/2021   10:12  AM 06/19/2021   11:34 AM 08/10/2020   11:16 AM 08/04/2020   11:26 AM  Fall Risk   Falls in the past year? 0 0 0 0 0  Number falls in past yr: 0   0   Injury with Fall? 0      Risk for fall due to : Impaired balance/gait;Impaired mobility;Medication side effect   No Fall Risks   Follow up Falls evaluation completed;Education provided;Falls prevention discussed  Falls evaluation completed Falls evaluation completed     FALL RISK PREVENTION PERTAINING TO THE HOME:  Any stairs in or around the home? No  If so, are there any without handrails? N/a Home free of loose throw rugs in walkways, pet beds, electrical cords, etc? No  Adequate lighting in your home to reduce risk of falls? Yes   ASSISTIVE DEVICES UTILIZED TO PREVENT FALLS:  Life alert? No  Use of a cane, walker or w/c? Yes  Grab bars in the bathroom? Yes  Shower chair or bench in shower? Yes  Elevated toilet seat or a handicapped toilet? Yes   TIMED UP AND GO:  Was the  test performed? No .      Cognitive Function:        Immunizations Immunization History  Administered Date(s) Administered   Influenza Whole 11/25/2001, 12/16/2008, 10/26/2011, 11/26/2020   Influenza,inj,Quad PF,6+ Mos 11/19/2012, 11/28/2013, 11/05/2014, 11/12/2016, 11/19/2017, 11/12/2018, 10/29/2019   PFIZER(Purple Top)SARS-COV-2 Vaccination 05/11/2019, 06/03/2019, 12/15/2019   Pneumococcal Polysaccharide-23 03/23/2009   Tdap 01/12/2015   Zoster Recombinat (Shingrix) 04/01/2020, 06/09/2020    TDAP status: Up to date  Flu Vaccine status: Up to date  Pneumococcal vaccine status: Up to date  Covid-19 vaccine status: Completed vaccines  Qualifies for Shingles Vaccine? Yes   Zostavax completed Yes   Shingrix Completed?: Yes  Screening Tests Health Maintenance  Topic Date Due   HIV Screening  Never done   Hepatitis C Screening  Never done   PAP SMEAR-Modifier  03/04/2016   HEMOGLOBIN A1C  10/18/2017   COVID-19 Vaccine (4 - Booster for  [place] series) 02/09/2020   FOOT EXAM  06/15/2021   INFLUENZA VACCINE  09/12/2021   OPHTHALMOLOGY EXAM  01/25/2022   COLONOSCOPY (Pts 45-28yr Insurance coverage will need to be confirmed)  08/13/2022   MAMMOGRAM  12/30/2022   TETANUS/TDAP  01/11/2025   Zoster Vaccines- Shingrix  Completed   HPV VACCINES  Aged Out    Health Maintenance  Health Maintenance Due  Topic Date Due   HIV Screening  Never done   Hepatitis C Screening  Never done   PAP SMEAR-Modifier  03/04/2016   HEMOGLOBIN A1C  10/18/2017   COVID-19 Vaccine (4 - Booster for [place]) 02/09/2020   FOOT EXAM  06/15/2021    Colorectal cancer screening: Type of screening: Colonoscopy. Completed 08/12/2012. Repeat every 10 years  Mammogram status: Completed 12/29/2020. Repeat every year  Bone Density status: n/a  Lung Cancer Screening: (Low Dose CT Chest recommended if Age 449-80years, 30 pack-year currently smoking OR have quit w/in 15years.) does not qualify.   Lung Cancer Screening Referral: no  Additional Screening:  Hepatitis C Screening: does qualify;   Vision Screening: Recommended annual ophthalmology exams for early detection of glaucoma and other disorders of the eye. Is the patient up to date with their annual eye exam?  Yes  Who is the provider or what is the name of the [facility] in which the patient attends annual [facility] exams[facility] provider, would they like to be referred to a provider to establish care? No .   Dental Screening: Recommended annual dental exams for proper oral hygiene  Community Resource Referral / Chronic Care Management: CRR required this visit?  No   CCM required this visit?  No      Plan:     I have personally reviewed and noted the following in the patient's chart:   Medical and social history Use of alcohol, tobacco or illicit drugs  Current medications and supplements including opioid prescriptions. Patient is not currently  taking opioid prescriptions. Functional ability and status Nutritional status Physical activity Advanced directives List of other physicians Hospitalizations, surgeries, and ER visits in previous 12 months Vitals Screenings to include cognitive, depression, and falls Referrals and appointments  In addition, I have reviewed and discussed with patient certain preventive protocols, quality metrics, and best practice recommendations. A written personalized care plan for preventive services as well as general preventive health recommendations were provided to patient.     NKellie Simmering LPN   [id number]  Nurse Notes: 6 CIT not administered. Patient was  not feeling well.  Due to this being a virtual visit, the after visit summary with patients personalized plan was offered to patient via mail or my-chart. Patient would like to access on my-chart

## 2021-08-25 NOTE — Patient Instructions (Signed)
Nice to see you today I will be in touch with the labs once I have them Once I get the urine I will let you know the next steps too

## 2021-08-25 NOTE — Assessment & Plan Note (Signed)
View last abdominal x-ray that showed stool burden and possible nephrolithiasis on left side.  Patient is presenting atypical for kidney stone presentation.  She did have hematuria albeit microscopic.  At the time this has been closed lab work has been reviewed and was all normal

## 2021-08-25 NOTE — Telephone Encounter (Signed)
[facility] Night - Client TELEPHONE ADVICE RECORD [facility]urse Patient Name: Mackenzie Bonilla Gender: Female DOB: 02/11/1966 Age: 56 Y 1 M 7 D Return Phone Number: [phone] (Primary), [phone] (Secondary) Address: City/ State/ Zip: [place]  [address] Client [facility] Night - Client Client Site [place] Provider Tower, Roque Lias - MD Contact Type Call Who Is Calling Patient / Member / Family / Caregiver Call Type Triage / Clinical Relationship To Patient Self Return Phone Number [phone] (Primary) Chief Complaint Urine, Blood In Reason for Call Symptomatic / Request for [place] states she had urination frequency and this morning she has blood in her urine. Translation No Nurse Assessment Nurse: Thad Ranger, RN, Denise Date/Time (Eastern Time): 08/25/2021 8:04:05 AM Confirm and document reason for call. If symptomatic, describe symptoms. ---Caller states she had urination frequency and this morning she has blood in her urine. Does the patient have any new or worsening symptoms? ---Yes Will a triage be completed? ---Yes Related visit to physician within the last 2 weeks? ---No Does the PT have any chronic conditions? (i.e. diabetes, asthma, this includes High risk factors for pregnancy, etc.) ---Yes List chronic conditions. ---Diabetes Is this a behavioral health or substance abuse call? ---No Guidelines Guideline Title Affirmed Question Affirmed Notes Nurse Date/Time (Eastern Time) Urine - Blood In Blood in urine (Exception: Could be normal menstrual bleeding.) Carmon, RN, Langley Gauss 08/25/2021 8:04:30 AM Disp. Time Eilene Ghazi Time) Disposition Final User 08/25/2021 8:07:16 AM See PCP within 24 Hours Yes Carmon, RN, Langley Gauss Final Disposition 08/25/2021 8:07:16 AM See PCP within 24 Hours Yes Carmon, RN, Langley Gauss PLEASE NOTE: All timestamps contained  within this report are represented as [place] Standard Time. CONFIDENTIALTY NOTICE: This fax transmission is intended only for the addressee. It contains information that is legally privileged, confidential or otherwise protected from use or disclosure. If you are not the intended recipient, you are strictly prohibited from reviewing, disclosing, copying using or disseminating any of this information or taking any action in reliance on or regarding this information. If you have received this fax in error, please notify us immediately by telephone so that we can arrange for its return to [place]. Phone: [phone], Toll-Free: [phone], Fax: [phone] Page: 2 of 2 Call Id: [id number] [place] Disagree/Comply Comply Caller Understands Yes PreDisposition Call Doctor Care Advice Given Per Guideline SEE PCP WITHIN 24 HOURS: SAMPLE: * Bring in a sample of the bloody urine. * Keep it in the refrigerator until you leave. DRINK EXTRA FLUIDS: * Drink extra fluids. * Drink 8 to 10 cups (1,800 to 2,400 ml) of liquids a day. CALL BACK IF: * Fever or pain occurs * You become worse CARE ADVICE given per Urine, Blood In (Adult) guideline. Referrals REFERRED TO PCP OFFIC

## 2021-08-25 NOTE — Telephone Encounter (Signed)
Per appt notes pt already has appt with Romilda Garret NP 08/25/21 at 12 noon. Sending note to Romilda Garret NP and Anastasiya CMA.

## 2021-08-25 NOTE — Patient Instructions (Signed)
Mackenzie Bonilla , Thank you for taking time to come for your Medicare Wellness Visit. I appreciate your ongoing commitment to your health goals. Please review the following plan we discussed and let me know if I can assist you in the future.   Screening recommendations/referrals: Colonoscopy: completed 08/12/2012, due 08/13/2022 Mammogram: completed 12/29/2020, due 12/30/2021 Bone Density: n/a Recommended yearly ophthalmology/optometry visit for glaucoma screening and checkup Recommended yearly dental visit for hygiene and checkup  Vaccinations: Influenza vaccine: due 09/12/2021 Pneumococcal vaccine: n/a Tdap vaccine: completed 01/12/2015, due 01/11/2025 Shingles vaccine: completed  Covid-19:  12/15/2019, 06/03/2019, 05/11/2019  Advanced directives: Advance directive discussed with you today. .  Conditions/risks identified: none  Next appointment: Follow up in one year for your annual wellness visit.   Preventive Care 40-64 Years, Female Preventive care refers to lifestyle choices and visits with your health care provider that can promote health and wellness. What does preventive care include? A yearly physical exam. This is also called an annual well check. Dental exams once or twice a year. Routine eye exams. Ask your health care provider how often you should have your eyes checked. Personal lifestyle choices, including: Daily care of your teeth and gums. Regular physical activity. Eating a healthy diet. Avoiding tobacco and drug use. Limiting alcohol use. Practicing safe sex. Taking low-dose aspirin daily starting at age 31. Taking vitamin and mineral supplements as recommended by your health care provider. What happens during an annual well check? The services and screenings done by your health care provider during your annual well check will depend on your age, overall health, lifestyle risk factors, and family history of disease. Counseling  Your health care provider may ask you  questions about your: Alcohol use. Tobacco use. Drug use. Emotional well-being. Home and relationship well-being. Sexual activity. Eating habits. Work and work Statistician. Method of birth control. Menstrual cycle. Pregnancy history. Screening  You may have the following tests or measurements: Height, weight, and BMI. Blood pressure. Lipid and cholesterol levels. These may be checked every 5 years, or more frequently if you are over 73 years old. Skin check. Lung cancer screening. You may have this screening every year starting at age 45 if you have a 30-pack-year history of smoking and currently smoke or have quit within the past 15 years. Fecal occult blood test (FOBT) of the stool. You may have this test every year starting at age 53. Flexible sigmoidoscopy or colonoscopy. You may have a sigmoidoscopy every 5 years or a colonoscopy every 10 years starting at age 30. Hepatitis C blood test. Hepatitis B blood test. Sexually transmitted disease (STD) testing. Diabetes screening. This is done by checking your blood sugar (glucose) after you have not eaten for a while (fasting). You may have this done every 1-3 years. Mammogram. This may be done every 1-2 years. Talk to your health care provider about when you should start having regular mammograms. This may depend on whether you have a family history of breast cancer. BRCA-related cancer screening. This may be done if you have a family history of breast, ovarian, tubal, or peritoneal cancers. Pelvic exam and Pap test. This may be done every 3 years starting at age 51. Starting at age 13, this may be done every 5 years if you have a Pap test in combination with an HPV test. Bone density scan. This is done to screen for osteoporosis. You may have this scan if you are at high risk for osteoporosis. Discuss your test results, treatment options, and if  necessary, the need for more tests with your health care provider. Vaccines  Your health  care provider may recommend certain vaccines, such as: Influenza vaccine. This is recommended every year. Tetanus, diphtheria, and acellular pertussis (Tdap, Td) vaccine. You may need a Td booster every 10 years. Zoster vaccine. You may need this after age 20. Pneumococcal 13-valent conjugate (PCV13) vaccine. You may need this if you have certain conditions and were not previously vaccinated. Pneumococcal polysaccharide (PPSV23) vaccine. You may need one or two doses if you smoke cigarettes or if you have certain conditions. Talk to your health care provider about which screenings and vaccines you need and how often you need them. This information is not intended to replace advice given to you by your health care provider. Make sure you discuss any questions you have with your health care provider. Document Released: 02/25/2015 Document Revised: 10/19/2015 Document Reviewed: 11/30/2014 Elsevier Interactive Patient Education  2017 [place] Prevention in the Home Falls can cause injuries. They can happen to people of all ages. There are many things you can do to make your home safe and to help prevent falls. What can I do on the outside of my home? Regularly fix the edges of walkways and driveways and fix any cracks. Remove anything that might make you trip as you walk through a door, such as a raised step or threshold. Trim any bushes or trees on the path to your home. Use bright outdoor lighting. Clear any walking paths of anything that might make someone trip, such as rocks or tools. Regularly check to see if handrails are loose or broken. Make sure that both sides of any steps have handrails. Any raised decks and porches should have guardrails on the edges. Have any leaves, snow, or ice cleared regularly. Use sand or salt on walking paths during winter. Clean up any spills in your garage right away. This includes oil or grease spills. What can I do in the bathroom? Use  night lights. Install grab bars by the toilet and in the tub and shower. Do not use towel bars as grab bars. Use non-skid mats or decals in the tub or shower. If you need to sit down in the shower, use a plastic, non-slip stool. Keep the floor dry. Clean up any water that spills on the floor as soon as it happens. Remove soap buildup in the tub or shower regularly. Attach bath mats securely with double-sided non-slip rug tape. Do not have throw rugs and other things on the floor that can make you trip. What can I do in the bedroom? Use night lights. Make sure that you have a light by your bed that is easy to reach. Do not use any sheets or blankets that are too big for your bed. They should not hang down onto the floor. Have a firm chair that has side arms. You can use this for support while you get dressed. Do not have throw rugs and other things on the floor that can make you trip. What can I do in the kitchen? Clean up any spills right away. Avoid walking on wet floors. Keep items that you use a lot in easy-to-reach places. If you need to reach something above you, use a strong step stool that has a grab bar. Keep electrical cords out of the way. Do not use floor polish or wax that makes floors slippery. If you must use wax, use non-skid floor wax. Do not have throw  rugs and other things on the floor that can make you trip. What can I do with my stairs? Do not leave any items on the stairs. Make sure that there are handrails on both sides of the stairs and use them. Fix handrails that are broken or loose. Make sure that handrails are as long as the stairways. Check any carpeting to make sure that it is firmly attached to the stairs. Fix any carpet that is loose or worn. Avoid having throw rugs at the top or bottom of the stairs. If you do have throw rugs, attach them to the floor with carpet tape. Make sure that you have a light switch at the top of the stairs and the bottom of the  stairs. If you do not have them, ask someone to add them for you. What else can I do to help prevent falls? Wear shoes that: Do not have high heels. Have rubber bottoms. Are comfortable and fit you well. Are closed at the toe. Do not wear sandals. If you use a stepladder: Make sure that it is fully opened. Do not climb a closed stepladder. Make sure that both sides of the stepladder are locked into place. Ask someone to hold it for you, if possible. Clearly mark and make sure that you can see: Any grab bars or handrails. First and last steps. Where the edge of each step is. Use tools that help you move around (mobility aids) if they are needed. These include: Canes. Walkers. Scooters. Crutches. Turn on the lights when you go into a dark area. Replace any light bulbs as soon as they burn out. Set up your furniture so you have a clear path. Avoid moving your furniture around. If any of your floors are uneven, fix them. If there are any pets around you, be aware of where they are. Review your medicines with your doctor. Some medicines can make you feel dizzy. This can increase your chance of falling. Ask your doctor what other things that you can do to help prevent falls. This information is not intended to replace advice given to you by your health care provider. Make sure you discuss any questions you have with your health care provider. Document Released: 11/25/2008 Document Revised: 07/07/2015 Document Reviewed: 03/05/2014 Elsevier Interactive Patient Education  2017 [employer].

## 2021-08-25 NOTE — Assessment & Plan Note (Signed)
Presentation and UA positive for blood and leukocytes will elect to treat with Macrobid 100 mg twice daily for 5 days.  Pending urinary culture

## 2021-08-25 NOTE — Assessment & Plan Note (Signed)
Patient still dealing with constipation.  She is on [facility] which likely could worsen the patient's presentation.  Did advise she can use over-the-counter stool softeners she can also use over-the-counter laxatives for faster result this will result in cramping and diarrhea likely.  Looking in the chart it was seen that she may be a candidate for Linzess but patient states she does not want to take anything daily.  She has tried [place] but states she is unable to tolerate full doses of MiraLAX every day

## 2021-08-27 LAB — URINE CULTURE
MICRO NUMBER:: 13648667
SPECIMEN QUALITY:: ADEQUATE

## 2021-08-28 NOTE — Telephone Encounter (Signed)
Patient calling back to report she is experiencing blood in urine, once on Sunday, once or twice on Saturday  She drank lots of water  Patient has five pills left, MD had stated she may need a different pill wanted to follow-up to check

## 2021-08-28 NOTE — Telephone Encounter (Signed)
Spoke with [facility] before calling patient-continue antibiotic and symptoms should improve as she finishes the medication. Just started antibiotic 2 days ago. Advised patient to let us know if things do not resolve by the end of the course of medication. Patient states frequency is better now. Patient describes the urine color is like a darker orange color but not every time, sometimes it is very light yellow. Patient states no blood on the toilet paper or in the underwear. Not sure if maybe she is just dehydrated at times vs blood. Advised patient to keep a check on this, make sure no blood clots or vomiting or nausea. Patient was advised of urine culture results also.

## 2021-08-31 ENCOUNTER — Encounter: Payer: Self-pay | Admitting: Family Medicine

## 2021-08-31 ENCOUNTER — Ambulatory Visit: Payer: Medicare PPO | Admitting: Family Medicine

## 2021-08-31 VITALS — BP 114/66 | HR 74 | Ht 64.0 in | Wt 287.0 lb

## 2021-08-31 DIAGNOSIS — J0100 Acute maxillary sinusitis, unspecified: Secondary | ICD-10-CM

## 2021-08-31 DIAGNOSIS — M549 Dorsalgia, unspecified: Secondary | ICD-10-CM | POA: Diagnosis not present

## 2021-08-31 DIAGNOSIS — L570 Actinic keratosis: Secondary | ICD-10-CM | POA: Diagnosis not present

## 2021-08-31 DIAGNOSIS — K5900 Constipation, unspecified: Secondary | ICD-10-CM

## 2021-08-31 MED ORDER — AZITHROMYCIN 250 MG PO TABS: ORAL_TABLET | ORAL | 0 refills | Status: AC

## 2021-08-31 NOTE — Progress Notes (Signed)
Subjective:    Patient ID: Mackenzie Bonilla, female    DOB: 1965/09/11, 56 y.o.   MRN: [id number]  HPI Pt presents for skin problem and pain in middle back   Wt Readings from Last 3 Encounters:  08/31/21 287 lb (130.2 kg)  08/25/21 286 lb (129.7 kg)  08/25/21 286 lb 4 oz (129.8 kg)   49.26 kg/m  There is a skin /pimple on butt check (right)  Spot from old abscess is tender   Area on L side of buttock is sensitive but not painful and no rash at all  May be from her underwear line (does not fit as well since loosing weight)   Still not having bms regularly  Miralax - cannot tolerate liquid well  Bought a laxative gummy  Gas ex   Sinus symptoms  Pain under R eye more than L  Congestion after a uti  Tight in face  Used antihistamine  Uses flonase   Has not used saline  No fever  No cough   Had to have AC fixed- produced a lot of ash in her appt     Mid back is painful   Finished abx for uti yesterday - macrobid  More concentrated urine in middle of the night    Depression /irritability are bad  [place]   Patient Active Problem List   Diagnosis Date Noted   Keratosis 08/31/2021   Cystitis with hematuria 08/25/2021   Frequent urination 07/05/2021   Yeast vaginitis 07/05/2021   Hair loss 06/19/2021   Generalized abdominal pain 06/13/2021   Urinary frequency 06/13/2021   Abscess of left buttock 06/02/2021   Wound with tunneling 06/02/2021   Breast pain, right 12/20/2020   Screening mammogram for breast cancer 12/13/2020   Fatigue 06/15/2020   Microscopic hematuria 04/19/2020   Lumbar facet arthropathy 12/29/2019   Chronic bilateral thoracic back pain 12/08/2019   Lumbar degenerative disc disease 12/08/2019   Fibromyalgia 12/08/2019   Chronic pain syndrome 12/08/2019   Lumbar radicular pain (left S1) 12/08/2019   Mid back pain 02/11/2019   Multiple joint pain 11/12/2018   UTI (urinary tract infection) 04/08/2018   Eating  disorder 05/23/2017   Gallstones 04/19/2017   Diabetic polyneuropathy associated with type 2 diabetes mellitus ([place]) 04/07/2017   Diabetic angiopathy ([place]) 04/07/2017   Vasomotor symptoms due to menopause 04/07/2017   Mobility impaired 11/12/2016   Urinary incontinence 12/02/2015   Candidal intertrigo 04/06/2015   Cystocele 04/06/2015   Constipation 04/06/2015   Acute sinusitis 12/28/2014   Myofascial pain syndrome of lumbar spine 12/28/2014   Hypertriglyceridemia 10/14/2014   Breast mass in female 07/28/2014   Right knee pain 07/23/2013   Fall 07/23/2013   Medication management 11/20/2012   Uric acid kidney stone 11/20/2011   HYPERHIDROSIS 11/07/2009   Menopausal symptoms 06/17/2009   Type 2 diabetes mellitus without complication, without long-term current use of insulin ([place]) 08/23/2008   Vitamin D deficiency 05/07/2008   Vitamin B 12 deficiency 06/04/2007   CHRONIC FATIGUE SYNDROME 11/15/2006   DIVERTICULITIS, HX OF 11/15/2006   PCOS (polycystic ovarian syndrome) 10/16/2006   Hyperlipidemia associated with type 2 diabetes mellitus ([place]) 10/16/2006   Morbid obesity ([place]) 10/16/2006   Generalized anxiety disorder 10/16/2006   PANIC DISORDER 10/16/2006   BULIMIA 10/16/2006   Chronic depression 10/16/2006   CARPAL TUNNEL SYNDROME 10/16/2006   LYMPHEDEMA 10/16/2006   Allergic rhinitis 10/16/2006   Mild intermittent asthma 10/16/2006   TMJ SYNDROME 10/16/2006  GERD 10/16/2006   IRRITABLE BOWEL SYNDROME 10/16/2006   Osteoarthritis 10/16/2006   COUGH, CHRONIC 10/16/2006   Past Medical History:  Diagnosis Date   Allergy    allergic rhinitis   Anemia    Anxiety    Arthritis    osteoarthritis   Asthma    Claustrophobia    does not like oxygen mask covering face   Depression    Diabetes mellitus without complication ([place])    Fall 2016   Fatty liver 07/2008   abd. ultrasound - fatty liver ; slt dilated cbd (no stones ) 06/10// abd.  ultrasound normal on 04/2006    Fibromyalgia    GERD (gastroesophageal reflux disease)    EGD negative 06/2001// EGD erythematous mucosa, polyp 08/2008   High uric acid in 24 hour urine specimen    Hyperhidrosis    Hyperlipidemia    Kidney stones    Lymphedema    Obesity    PCOS (polycystic ovarian syndrome)    Shortness of breath dyspnea    Tachycardia    TMJ (dislocation of temporomandibular joint)    UTI (lower urinary tract infection)    Past Surgical History:  Procedure Laterality Date   BREAST BIOPSY Right 2016   neg   BREAST LUMPECTOMY WITH RADIOACTIVE SEED LOCALIZATION Right 08/02/2014   Procedure: BREAST LUMPECTOMY WITH RADIOACTIVE SEED LOCALIZATION;  Surgeon: Rolm Bookbinder, MD;  Location: [facility];  Service: General;  Laterality: Right;   COLONOSCOPY  08/12/2012   Completed by Dr. Phylis Bougie, normal exam.    ENDOMETRIAL BIOPSY  01/2004   ESOPHAGOGASTRODUODENOSCOPY     ESOPHAGOGASTRODUODENOSCOPY (EGD) WITH PROPOFOL N/A 08/18/2019   Procedure: ESOPHAGOGASTRODUODENOSCOPY (EGD) WITH PROPOFOL;  Surgeon: Lucilla Lame, MD;  Location[facility] ENDOSCOPY;  Service: Endoscopy;  Laterality: N/A;   FOOT SURGERY     SEPTOPLASTY     two times   TONSILLECTOMY     UTERINE FIBROID SURGERY  06/2005   ablation   uterine tumor  09/2001   VAGUS NERVE STIMULATOR INSERTION     Social History   Tobacco Use   Smoking status: Never   Smokeless tobacco: Never  Vaping Use   Vaping Use: Never used  Substance Use Topics   Alcohol use: No    Alcohol/week: 0.0 standard drinks of alcohol   Drug use: No   Family History  Problem Relation Age of Onset   Hypertension Father    Cancer Father        pancreatic cancer   Hypertension Sister    Heart disease Maternal Grandmother 44       MI   Diabetes Maternal Grandmother    Heart disease Paternal Grandmother 10       MI   Diabetes Paternal Grandmother    Breast cancer Neg Hx    Allergies  Allergen Reactions   Cephalexin    Codeine     rash   Duloxetine    Levaquin  [Levofloxacin] Nausea Only   Lisinopril Cough   Metformin And Related Diarrhea    GI side effects    Quetiapine Other (See Comments)   Sertraline Hcl     REACTION: vivid dreams   Triazolam     REACTION: ? reaction   Zaleplon    Zocor [Simvastatin - High Dose]     achey   Zolpidem Tartrate     hallucinations   Hydrocodone Itching and Rash    REACTION: redness rash itching   Norelgestromin-Eth Estradiol     Patch didn't stick to skin  Current Outpatient Medications on File Prior to Visit  Medication Sig Dispense Refill   acetaminophen (TYLENOL) 500 MG tablet Take 500 mg by mouth every 6 (six) hours as needed.      albuterol (VENTOLIN HFA) 108 (90 Base) MCG/ACT inhaler INHALE 2 PUFFS INTO THE LUNGS EVERY 6 HOURS AS NEEDED FOR WHEEZING. 18 g 3   ALPRAZolam (XANAX) 1 MG tablet Take 2 mg by mouth 3 (three) times daily as needed.     ammonium lactate (AMLACTIN) 12 % cream Apply 1 application. topically in the morning and at bedtime. For dry skin 385 g 2   Blood Glucose Monitoring Suppl (FIFTY50 GLUCOSE METER 2.0) w/Device KIT Use as directed     buPROPion (WELLBUTRIN XL) 150 MG 24 hr tablet Take 150 mg by mouth daily.     Cholecalciferol (VITAMIN D3 PO) Take 2,000 Units by mouth daily.     clindamycin (CLEOCIN-T) 1 % lotion Apply topically daily. Qd to bumps on face and chest 60 mL 6   cyclobenzaprine (FLEXERIL) 5 MG tablet Take 1 tablet (5 mg total) by mouth 3 (three) times daily as needed for muscle spasms. 90 tablet 2   desvenlafaxine (PRISTIQ) 100 MG 24 hr tablet Take 100 mg by mouth daily.     dimenhyDRINATE (DRAMAMINE) 50 MG tablet Take 50 mg by mouth every 8 (eight) hours as needed.     Docusate Calcium (STOOL SOFTENER PO) Take 1-2 tablets by mouth daily as needed.     fluticasone (FLONASE) 50 MCG/ACT nasal spray Place into both nostrils daily.     gabapentin (NEURONTIN) 100 MG capsule TAKE 2 CAPSULES BY MOUTH EVERY MORNING AND 3 CAPSULES EVERY EVENING 150 capsule 0   ibuprofen  (ADVIL) 600 MG tablet Take 1 tablet (600 mg total) by mouth every 8 (eight) hours as needed. 30 tablet 0   Insulin Degludec (TRESIBA [place]) Inject into the skin. 1 injection nightly 8-10 pm     ketoconazole (NIZORAL) 2 % cream Apply 1 application topically daily. 60 g 2   losartan (COZAAR) 25 MG tablet      nitrofurantoin, macrocrystal-monohydrate, (MACROBID) 100 MG capsule Take 1 capsule (100 mg total) by mouth 2 (two) times daily. 10 capsule 0   nystatin cream (MYCOSTATIN) APPLY TO AFFECTED AREAS 3 TIMES DAILY ASNEEDED 30 g 3   omeprazole (PRILOSEC) 20 MG capsule TAKE 1 CAPSULE BY MOUTH 2 TIMES DAILY. 180 capsule 3   OVER THE COUNTER MEDICATION Take 1 capsule by mouth daily. BARIATRIC MULTIVITAMIN IRON FREE     oxybutynin (DITROPAN XL) 15 MG 24 hr tablet TAKE 1 TABLET BY MOUTH DAILY 90 tablet 1   Polyethyl Glycol-Propyl Glycol (SYSTANE OP) Apply 1 drop to eye daily as needed.     pravastatin (PRAVACHOL) 20 MG tablet 1 TABLET BY MOUTH AT BEDTIME. 90 tablet 3   psyllium (METAMUCIL) 58.6 % powder Take 1 packet by mouth 2 (two) times daily.     Simethicone (GAS-X PO) Take by mouth as needed.     spironolactone (ALDACTONE) 100 MG tablet TAKE 1 TABLET BY MOUTH DAILY 90 tablet 0   tirzepatide (MOUNJARO) 7.5 MG/0.5ML Pen Inject 10 mg into the skin once a week.     traMADol (ULTRAM) 50 MG tablet TAKE ONE TABLET TWICE A DAY IF NEEDED FOR SEVERE PAIN 30 tablet 0   traZODone (DESYREL) 150 MG tablet Take 300 mg by mouth at bedtime.      zinc oxide (BALMEX) 11.3 % CREA cream Apply 1 application topically  2 (two) times daily.     No current facility-administered medications on file prior to visit.     Review of Systems  Constitutional:  Positive for fatigue. Negative for activity change, appetite change, fever and unexpected weight change.  HENT:  Positive for congestion, sinus pressure and sinus pain. Negative for ear pain, rhinorrhea and sore throat.   Eyes:  Negative for pain, redness and visual  disturbance.  Respiratory:  Negative for cough, shortness of breath and wheezing.   Cardiovascular:  Negative for chest pain and palpitations.  Gastrointestinal:  Negative for abdominal pain, blood in stool, constipation and diarrhea.  Endocrine: Negative for polydipsia and polyuria.  Genitourinary:  Negative for dysuria, frequency and urgency.  Musculoskeletal:  Negative for arthralgias, back pain and myalgias.  Skin:  Positive for wound. Negative for pallor and rash.  Allergic/Immunologic: Negative for environmental allergies.  Neurological:  Negative for dizziness, syncope and headaches.  Hematological:  Negative for adenopathy. Does not bruise/bleed easily.  Psychiatric/Behavioral:  Negative for decreased concentration and dysphoric mood. The patient is not nervous/anxious.        Objective:   Physical Exam Constitutional:      General: She is not in acute distress.    Appearance: Normal appearance. She is well-developed. She is obese. She is not ill-appearing or diaphoretic.  HENT:     Head: Normocephalic and atraumatic.     Comments: Bilateral maxillary and frontal sinus tenderness/ worse on rhe R    Right Ear: Tympanic membrane, ear canal and external ear normal.     Left Ear: Tympanic membrane, ear canal and external ear normal.     Nose: Congestion and rhinorrhea present.     Mouth/Throat:     Mouth: Mucous membranes are moist.     Pharynx: Oropharynx is clear. No oropharyngeal exudate or posterior oropharyngeal erythema.     Comments: Clear pnd Eyes:     General:        Right eye: No discharge.        Left eye: No discharge.     Conjunctiva/sclera: Conjunctivae normal.     Pupils: Pupils are equal, round, and reactive to light.  Cardiovascular:     Rate and Rhythm: Normal rate and regular rhythm.  Pulmonary:     Effort: Pulmonary effort is normal. No respiratory distress.     Breath sounds: Normal breath sounds. No wheezing or rales.     Comments: Good air exch No  rales or rhonchi Abdominal:     General: There is no distension.     Tenderness: There is no abdominal tenderness.  Musculoskeletal:     Cervical back: Normal range of motion and neck supple.  Lymphadenopathy:     Cervical: No cervical adenopathy.  Skin:    General: Skin is warm and dry.     Coloration: Skin is not jaundiced or pale.     Findings: No bruising, erythema or rash.     Comments: Abraded 2 mm keratosis on R buttock - with scab and dried blood  No skin lesion on L  Previous abscess is healed    Neurological:     Mental Status: She is alert.     Cranial Nerves: No cranial nerve deficit.     Coordination: Coordination normal.  Psychiatric:        Mood and Affect: Mood normal.     Comments: Depressed and tearful when talking about stressors Candidly discusses symptoms and stressors  Assessment & Plan:   Problem List Items Addressed This Visit       Respiratory   Acute sinusitis    Right maxillary with congestion and purulent drainage  Px zpak which she tolerates Recommend saline irrigation prn  Update if not starting to improve in a week or if worsening        Relevant Medications   azithromycin (ZITHROMAX) 250 MG tablet     Other   Constipation    Encouraged pt to use miralax with her usual fluids since she does not tolerate excessive fluids   Continue high fiber diet with exercise as tolerated       Keratosis - Primary    Abraded keratosis on R buttock cheek- was bleeding at home several days ago Now appears to be a scab with no signs of infection  inst pt to watch this /keep clean with soap and water and antibiotic ointment otc Update if not starting to improve in a week or if worsening  If not healing consider further eval       Mid back pain    Continues chiropractic care  Very seldomly uses tramadol

## 2021-08-31 NOTE — Patient Instructions (Addendum)
Your skin looks good  There is a small mole that looked abraded on the right  Use antibiotic ointment if needed  The abscess area looks fine  I do not see any skin changes on the left   Start mixing some miralax into all of your regular liquids through the day  This may help more than anything  Investigate aloe vera as well    Try nasal saline  Take zpak as directed and let me know if the sinus symptoms don't improve let me

## 2021-08-31 NOTE — Assessment & Plan Note (Signed)
Right maxillary with congestion and purulent drainage  Px zpak which she tolerates Recommend saline irrigation prn  Update if not starting to improve in a week or if worsening

## 2021-08-31 NOTE — Assessment & Plan Note (Signed)
Abraded keratosis on R buttock cheek- was bleeding at home several days ago Now appears to be a scab with no signs of infection  inst pt to watch this /keep clean with soap and water and antibiotic ointment otc Update if not starting to improve in a week or if worsening  If not healing consider further eval

## 2021-08-31 NOTE — Assessment & Plan Note (Signed)
Encouraged pt to use miralax with her usual fluids since she does not tolerate excessive fluids   Continue high fiber diet with exercise as tolerated

## 2021-08-31 NOTE — Assessment & Plan Note (Signed)
Continues chiropractic care  Very seldomly uses tramadol

## 2021-09-05 ENCOUNTER — Ambulatory Visit: Payer: Medicare PPO | Admitting: Dermatology

## 2021-09-12 ENCOUNTER — Ambulatory Visit: Payer: Medicare PPO | Admitting: Podiatry

## 2021-09-12 DIAGNOSIS — B351 Tinea unguium: Secondary | ICD-10-CM

## 2021-09-12 DIAGNOSIS — M79674 Pain in right toe(s): Secondary | ICD-10-CM | POA: Diagnosis not present

## 2021-09-12 DIAGNOSIS — M79675 Pain in left toe(s): Secondary | ICD-10-CM | POA: Diagnosis not present

## 2021-09-12 NOTE — Progress Notes (Signed)
   SUBJECTIVE Patient with a history of diabetes mellitus presents to office today complaining of elongated, thickened nails that cause pain while ambulating in shoes.  Patient is unable to trim their own nails. Patient is here for further evaluation and treatment.   Past Medical History:  Diagnosis Date   Allergy    allergic rhinitis   Anemia    Anxiety    Arthritis    osteoarthritis   Asthma    Claustrophobia    does not like oxygen mask covering face   Depression    Diabetes mellitus without complication ([address])    Fall 2016   Fatty liver 07/2008   abd. ultrasound - fatty liver ; slt dilated cbd (no stones ) 06/10// abd.  ultrasound normal on 04/2006   Fibromyalgia    GERD (gastroesophageal reflux disease)    EGD negative 06/2001// EGD erythematous mucosa, polyp 08/2008   High uric acid in 24 hour urine specimen    Hyperhidrosis    Hyperlipidemia    Kidney stones    Lymphedema    Obesity    PCOS (polycystic ovarian syndrome)    Shortness of breath dyspnea    Tachycardia    TMJ (dislocation of temporomandibular joint)    UTI (lower urinary tract infection)     OBJECTIVE General Patient is awake, alert, and oriented x 3 and in no acute distress. Derm Skin is dry and supple bilateral. Negative open lesions or macerations. Remaining integument unremarkable. Nails are tender, long, thickened and dystrophic with subungual debris, consistent with onychomycosis, 1-5 bilateral. No signs of infection noted. Vasc  DP and PT pedal pulses palpable bilaterally. Temperature gradient within normal limits.  Neuro Epicritic and protective threshold sensation diminished bilaterally.  Musculoskeletal Exam No symptomatic pedal deformities noted bilateral. Muscular strength within normal limits.  ASSESSMENT 1. Diabetes Mellitus w/ peripheral neuropathy 2.  Pain due to onychomycosis of toenails bilateral  PLAN OF CARE 1. Patient evaluated today. 2. Instructed to maintain good pedal  hygiene and foot care. Stressed importance of controlling blood sugar.  3. Mechanical debridement of nails 1-5 bilaterally performed using a nail nipper. Filed with dremel without incident.  4. Return to clinic in 3 mos.   *Going to her sister's 25th wedding anniversary this weekend  Edrick Kins, DPM [facility]  Dr. Edrick Kins, DPM    [address], [place] [address]                Office [phone]  Fax [phone]

## 2021-09-15 ENCOUNTER — Ambulatory Visit: Payer: Medicare PPO | Admitting: Podiatry

## 2021-09-18 DIAGNOSIS — E1142 Type 2 diabetes mellitus with diabetic polyneuropathy: Secondary | ICD-10-CM | POA: Diagnosis not present

## 2021-09-18 DIAGNOSIS — E1129 Type 2 diabetes mellitus with other diabetic kidney complication: Secondary | ICD-10-CM | POA: Diagnosis not present

## 2021-09-18 DIAGNOSIS — R809 Proteinuria, unspecified: Secondary | ICD-10-CM | POA: Diagnosis not present

## 2021-09-18 DIAGNOSIS — Z6841 Body Mass Index (BMI) 40.0 and over, adult: Secondary | ICD-10-CM | POA: Diagnosis not present

## 2021-09-18 DIAGNOSIS — E669 Obesity, unspecified: Secondary | ICD-10-CM | POA: Diagnosis not present

## 2021-09-18 DIAGNOSIS — Z794 Long term (current) use of insulin: Secondary | ICD-10-CM | POA: Diagnosis not present

## 2021-09-18 DIAGNOSIS — E1122 Type 2 diabetes mellitus with diabetic chronic kidney disease: Secondary | ICD-10-CM | POA: Diagnosis not present

## 2021-09-18 DIAGNOSIS — N1831 Chronic kidney disease, stage 3a: Secondary | ICD-10-CM | POA: Diagnosis not present

## 2021-09-18 DIAGNOSIS — E1169 Type 2 diabetes mellitus with other specified complication: Secondary | ICD-10-CM | POA: Diagnosis not present

## 2021-10-02 ENCOUNTER — Other Ambulatory Visit: Payer: Self-pay | Admitting: Podiatry

## 2021-10-02 ENCOUNTER — Telehealth: Payer: Self-pay

## 2021-10-02 DIAGNOSIS — F32A Depression, unspecified: Secondary | ICD-10-CM

## 2021-10-02 DIAGNOSIS — F411 Generalized anxiety disorder: Secondary | ICD-10-CM

## 2021-10-02 NOTE — Telephone Encounter (Signed)
I will put a new referral in for counseling  Does she prefer [facility] or [facility]?   It looks like the podiatrist did the last gabapentin refill   Thanks

## 2021-10-02 NOTE — Telephone Encounter (Signed)
Please ask if she prefers [facility], thanks

## 2021-10-02 NOTE — Telephone Encounter (Signed)
I reached out to Mackenzie Bonilla to have behavioral health call her again- thanks for letting me know

## 2021-10-02 NOTE — Telephone Encounter (Signed)
Patient called in stating she had a referral placed awhile ago for psychology, but when someone called to get her scheduled it was a call unknown so she didn't feel comfortable speaking and turned the referral down. Patient wants to know if she can get another one placed if possible.

## 2021-10-02 NOTE — Telephone Encounter (Signed)
The last Psychology referral on file is from 31.   She will needed a new referral

## 2021-10-02 NOTE — Telephone Encounter (Signed)
Forwarding

## 2021-10-03 NOTE — Telephone Encounter (Signed)
[place]

## 2021-10-03 NOTE — Telephone Encounter (Signed)
I put the referral in  Watch for a call  Let us know if she does not hear in 1-2 wk

## 2021-10-03 NOTE — Addendum Note (Signed)
Addended by: Loura Pardon A on: 10/03/2021 01:07 PM   Modules accepted: Orders

## 2021-10-09 ENCOUNTER — Telehealth: Payer: Self-pay | Admitting: Family Medicine

## 2021-10-09 NOTE — Telephone Encounter (Signed)
Pt notified of Dr. Marliss Coots recommendations and will keep appt on Wednesday

## 2021-10-09 NOTE — Telephone Encounter (Signed)
Patient called in stating that she has something in between her thigh and the underwear line, stated its not a rash or bump. She stated that when she wash it and dry it with a towel it bleeds. She stated that she was using some Nystatin and Zinc Oxide but nothing is working. Patient is scheduled for Wednesday, but she stated if you want her to try a medication please give her a call at [phone]. Thank you!

## 2021-10-09 NOTE — Telephone Encounter (Signed)
Left VM requesting pt to call the office back 

## 2021-10-09 NOTE — Telephone Encounter (Signed)
Patient called in and changed the appointment to tomorrow morning. She stated that one spot has turned into 3 different spots now. Thank you!

## 2021-10-09 NOTE — Telephone Encounter (Signed)
Ketoconazole cream may help  If she does not have any please refill She may need to switch to some cotton shorts instead of underwear with elastic for now

## 2021-10-10 ENCOUNTER — Encounter: Payer: Self-pay | Admitting: Family Medicine

## 2021-10-10 ENCOUNTER — Ambulatory Visit: Payer: Medicare PPO | Admitting: Family Medicine

## 2021-10-10 ENCOUNTER — Ambulatory Visit: Payer: Medicare PPO | Admitting: Podiatry

## 2021-10-10 VITALS — BP 122/74 | HR 81 | Temp 97.6°F | Ht 64.0 in | Wt 283.1 lb

## 2021-10-10 DIAGNOSIS — T148XXA Other injury of unspecified body region, initial encounter: Secondary | ICD-10-CM | POA: Insufficient documentation

## 2021-10-10 DIAGNOSIS — S30811A Abrasion of abdominal wall, initial encounter: Secondary | ICD-10-CM

## 2021-10-10 DIAGNOSIS — K219 Gastro-esophageal reflux disease without esophagitis: Secondary | ICD-10-CM | POA: Diagnosis not present

## 2021-10-10 DIAGNOSIS — B372 Candidiasis of skin and nail: Secondary | ICD-10-CM | POA: Diagnosis not present

## 2021-10-10 NOTE — Progress Notes (Signed)
Subjective:    Patient ID: Mackenzie Bonilla, female    DOB: 02-Jun-1965, 56 y.o.   MRN: [id number]  HPI Pt presents for skin spots/irritation, GERD and Gas   Wt Readings from Last 3 Encounters:  10/10/21 283 lb 2 oz (128.4 kg)  08/31/21 287 lb (130.2 kg)  08/25/21 286 lb (129.7 kg)   48.60 kg/m Her scale 278    Red spot in crease of thigh on L  Mid week last week  It bled a little  Used nystatin cream  Also tried triple abx oint   Yesterday there were 3 spots  Hurts to wipe or clean it  (the skin around it is sensitive)  At little itching   Prone to intertrigo with skin folds after weight loss    Is taking mounjaro 12.5 mg weekly (that dose for 2 weeks)  Getting more indigestion/reflux  Thought it was gas - does have flatus /lower abdomen   Occ gets sulfur taste in her mouth  Needs to drink more water   Was eating some wrong things Baked crackers- give her more gas  Carbs in general  Has to avoid sorbitol  Some chocolate   Has baseline dry mouth   Sees Dr Gabriel Carina in November  Counseling soon with out new staff here   GERD Omeprazole 20 mg bid  Egd 2021 with gastritis and neg bx (Dr Allen Norris)   IBS- is baseline   Patient Active Problem List   Diagnosis Date Noted   Abrasion 10/10/2021   Keratosis 08/31/2021   Cystitis with hematuria 08/25/2021   Frequent urination 07/05/2021   Yeast vaginitis 07/05/2021   Hair loss 06/19/2021   Generalized abdominal pain 06/13/2021   Urinary frequency 06/13/2021   Abscess of left buttock 06/02/2021   Wound with tunneling 06/02/2021   Breast pain, right 12/20/2020   Screening mammogram for breast cancer 12/13/2020   Fatigue 06/15/2020   Microscopic hematuria 04/19/2020   Lumbar facet arthropathy 12/29/2019   Chronic bilateral thoracic back pain 12/08/2019   Lumbar degenerative disc disease 12/08/2019   Fibromyalgia 12/08/2019   Chronic pain syndrome 12/08/2019   Lumbar radicular pain (left S1) 12/08/2019   Mid  back pain 02/11/2019   Multiple joint pain 11/12/2018   UTI (urinary tract infection) 04/08/2018   Eating disorder 05/23/2017   Gallstones 04/19/2017   Diabetic polyneuropathy associated with type 2 diabetes mellitus ([place]) 04/07/2017   Diabetic angiopathy ([place]) 04/07/2017   Vasomotor symptoms due to menopause 04/07/2017   Mobility impaired 11/12/2016   Urinary incontinence 12/02/2015   Candidal intertrigo 04/06/2015   Cystocele 04/06/2015   Constipation 04/06/2015   Acute sinusitis 12/28/2014   Myofascial pain syndrome of lumbar spine 12/28/2014   Hypertriglyceridemia 10/14/2014   Breast mass in female 07/28/2014   Right knee pain 07/23/2013   Fall 07/23/2013   Medication management 11/20/2012   Uric acid kidney stone 11/20/2011   HYPERHIDROSIS 11/07/2009   Menopausal symptoms 06/17/2009   Type 2 diabetes mellitus without complication, without long-term current use of insulin ([place]) 08/23/2008   Vitamin D deficiency 05/07/2008   Vitamin B 12 deficiency 06/04/2007   CHRONIC FATIGUE SYNDROME 11/15/2006   DIVERTICULITIS, HX OF 11/15/2006   PCOS (polycystic ovarian syndrome) 10/16/2006   Hyperlipidemia associated with type 2 diabetes mellitus ([place]) 10/16/2006   Morbid obesity ([place]) 10/16/2006   Generalized anxiety disorder 10/16/2006   PANIC DISORDER 10/16/2006   BULIMIA 10/16/2006   Chronic depression 10/16/2006   CARPAL TUNNEL SYNDROME 10/16/2006   LYMPHEDEMA  10/16/2006   Allergic rhinitis 10/16/2006   Mild intermittent asthma 10/16/2006   TMJ SYNDROME 10/16/2006   GERD 10/16/2006   IRRITABLE BOWEL SYNDROME 10/16/2006   Osteoarthritis 10/16/2006   COUGH, CHRONIC 10/16/2006   Past Medical History:  Diagnosis Date   Allergy    allergic rhinitis   Anemia    Anxiety    Arthritis    osteoarthritis   Asthma    Claustrophobia    does not like oxygen mask covering face   Depression    Diabetes mellitus without complication ([place])    Fall 2016   Fatty liver 07/2008    abd. ultrasound - fatty liver ; slt dilated cbd (no stones ) 06/10// abd.  ultrasound normal on 04/2006   Fibromyalgia    GERD (gastroesophageal reflux disease)    EGD negative 06/2001// EGD erythematous mucosa, polyp 08/2008   High uric acid in 24 hour urine specimen    Hyperhidrosis    Hyperlipidemia    Kidney stones    Lymphedema    Obesity    PCOS (polycystic ovarian syndrome)    Shortness of breath dyspnea    Tachycardia    TMJ (dislocation of temporomandibular joint)    UTI (lower urinary tract infection)    Past Surgical History:  Procedure Laterality Date   BREAST BIOPSY Right 2016   neg   BREAST LUMPECTOMY WITH RADIOACTIVE SEED LOCALIZATION Right 08/02/2014   Procedure: BREAST LUMPECTOMY WITH RADIOACTIVE SEED LOCALIZATION;  Surgeon: Rolm Bookbinder, MD;  Location: [facility];  Service: General;  Laterality: Right;   COLONOSCOPY  08/12/2012   Completed by Dr. Phylis Bougie, normal exam.    ENDOMETRIAL BIOPSY  01/2004   ESOPHAGOGASTRODUODENOSCOPY     ESOPHAGOGASTRODUODENOSCOPY (EGD) WITH PROPOFOL N/A 08/18/2019   Procedure: ESOPHAGOGASTRODUODENOSCOPY (EGD) WITH PROPOFOL;  Surgeon: Lucilla Lame, MD;  Location[facility] ENDOSCOPY;  Service: Endoscopy;  Laterality: N/A;   FOOT SURGERY     SEPTOPLASTY     two times   TONSILLECTOMY     UTERINE FIBROID SURGERY  06/2005   ablation   uterine tumor  09/2001   VAGUS NERVE STIMULATOR INSERTION     Social History   Tobacco Use   Smoking status: Never   Smokeless tobacco: Never  Vaping Use   Vaping Use: Never used  Substance Use Topics   Alcohol use: No    Alcohol/week: 0.0 standard drinks of alcohol   Drug use: No   Family History  Problem Relation Age of Onset   Hypertension Father    Cancer Father        pancreatic cancer   Hypertension Sister    Heart disease Maternal Grandmother 47       MI   Diabetes Maternal Grandmother    Heart disease Paternal Grandmother 71       MI   Diabetes Paternal Grandmother    Breast cancer Neg  Hx    Allergies  Allergen Reactions   Cephalexin    Codeine     rash   Duloxetine    Levaquin [Levofloxacin] Nausea Only   Lisinopril Cough   Metformin And Related Diarrhea    GI side effects    Quetiapine Other (See Comments)   Sertraline Hcl     REACTION: vivid dreams   Triazolam     REACTION: ? reaction   Zaleplon    Zocor [Simvastatin - High Dose]     achey   Zolpidem Tartrate     hallucinations   Hydrocodone Itching and Rash  REACTION: redness rash itching   Norelgestromin-Eth Estradiol     Patch didn't stick to skin   Current Outpatient Medications on File Prior to Visit  Medication Sig Dispense Refill   acetaminophen (TYLENOL) 500 MG tablet Take 500 mg by mouth every 6 (six) hours as needed.      albuterol (VENTOLIN HFA) 108 (90 Base) MCG/ACT inhaler INHALE 2 PUFFS INTO THE LUNGS EVERY 6 HOURS AS NEEDED FOR WHEEZING. 18 g 3   ALPRAZolam (XANAX) 1 MG tablet Take 2 mg by mouth 3 (three) times daily as needed.     ammonium lactate (AMLACTIN) 12 % cream Apply 1 application. topically in the morning and at bedtime. For dry skin 385 g 2   Blood Glucose Monitoring Suppl (FIFTY50 GLUCOSE METER 2.0) w/Device KIT Use as directed     buPROPion (WELLBUTRIN XL) 150 MG 24 hr tablet Take 150 mg by mouth daily.     Cholecalciferol (VITAMIN D3 PO) Take 2,000 Units by mouth daily.     clindamycin (CLEOCIN-T) 1 % lotion Apply topically daily. Qd to bumps on face and chest 60 mL 6   cyclobenzaprine (FLEXERIL) 5 MG tablet Take 1 tablet (5 mg total) by mouth 3 (three) times daily as needed for muscle spasms. 90 tablet 2   desvenlafaxine (PRISTIQ) 100 MG 24 hr tablet Take 100 mg by mouth daily.     dimenhyDRINATE (DRAMAMINE) 50 MG tablet Take 50 mg by mouth every 8 (eight) hours as needed.     Docusate Calcium (STOOL SOFTENER PO) Take 1-2 tablets by mouth daily as needed.     fluticasone (FLONASE) 50 MCG/ACT nasal spray Place into both nostrils daily.     gabapentin (NEURONTIN) 100 MG  capsule TAKE 2 CAPSULES BY MOUTH EVERY MORNING AND 3 CAPSULES EVERY EVENING 150 capsule 0   gabapentin (NEURONTIN) 300 MG capsule TAKE 1 CAPSULE AT BEDTIME 90 capsule 1   ibuprofen (ADVIL) 600 MG tablet Take 1 tablet (600 mg total) by mouth every 8 (eight) hours as needed. 30 tablet 0   Insulin Degludec (TRESIBA [place]) Inject into the skin. 1 injection nightly 8-10 pm     ketoconazole (NIZORAL) 2 % cream Apply 1 application topically daily. 60 g 2   losartan (COZAAR) 25 MG tablet      nystatin cream (MYCOSTATIN) APPLY TO AFFECTED AREAS 3 TIMES DAILY ASNEEDED 30 g 3   omeprazole (PRILOSEC) 20 MG capsule TAKE 1 CAPSULE BY MOUTH 2 TIMES DAILY. 180 capsule 3   OVER THE COUNTER MEDICATION Take 1 capsule by mouth daily. BARIATRIC MULTIVITAMIN IRON FREE     oxybutynin (DITROPAN XL) 15 MG 24 hr tablet TAKE 1 TABLET BY MOUTH DAILY 90 tablet 1   Polyethyl Glycol-Propyl Glycol (SYSTANE OP) Apply 1 drop to eye daily as needed.     pravastatin (PRAVACHOL) 20 MG tablet 1 TABLET BY MOUTH AT BEDTIME. 90 tablet 3   psyllium (METAMUCIL) 58.6 % powder Take 1 packet by mouth 2 (two) times daily.     Simethicone (GAS-X PO) Take by mouth as needed.     spironolactone (ALDACTONE) 100 MG tablet TAKE 1 TABLET BY MOUTH DAILY 90 tablet 0   tirzepatide (MOUNJARO) 7.5 MG/0.5ML Pen Inject 12.5 mg into the skin once a week.     traMADol (ULTRAM) 50 MG tablet TAKE ONE TABLET TWICE A DAY IF NEEDED FOR SEVERE PAIN 30 tablet 0   traZODone (DESYREL) 150 MG tablet Take 300 mg by mouth at bedtime.  zinc oxide (BALMEX) 11.3 % CREA cream Apply 1 application topically 2 (two) times daily.     No current facility-administered medications on file prior to visit.    Review of Systems  Constitutional:  Positive for fatigue. Negative for activity change, appetite change, fever and unexpected weight change.  HENT:  Negative for congestion, ear pain, rhinorrhea, sinus pressure and sore throat.   Eyes:  Negative for pain, redness and  visual disturbance.  Respiratory:  Negative for cough, shortness of breath and wheezing.   Cardiovascular:  Negative for chest pain and palpitations.  Gastrointestinal:  Negative for abdominal pain, blood in stool, constipation and diarrhea.       Heartburn gas  Endocrine: Negative for polydipsia and polyuria.  Genitourinary:  Negative for dysuria, frequency and urgency.  Musculoskeletal:  Negative for arthralgias, back pain and myalgias.  Skin:  Positive for wound. Negative for pallor and rash.  Allergic/Immunologic: Negative for environmental allergies.  Neurological:  Negative for dizziness, syncope and headaches.  Hematological:  Negative for adenopathy. Does not bruise/bleed easily.  Psychiatric/Behavioral:  Positive for dysphoric mood. Negative for decreased concentration. The patient is nervous/anxious.        Objective:   Physical Exam Constitutional:      General: She is not in acute distress.    Appearance: Normal appearance. She is well-developed. She is obese. She is not ill-appearing or diaphoretic.  HENT:     Head: Normocephalic and atraumatic.     Mouth/Throat:     Mouth: Mucous membranes are moist.  Eyes:     General: No scleral icterus.       Right eye: No discharge.        Left eye: No discharge.     Conjunctiva/sclera: Conjunctivae normal.     Pupils: Pupils are equal, round, and reactive to light.  Neck:     Thyroid: No thyromegaly.     Vascular: No carotid bruit or JVD.  Cardiovascular:     Rate and Rhythm: Normal rate and regular rhythm.     Heart sounds: Normal heart sounds.     No gallop.  Pulmonary:     Effort: Pulmonary effort is normal. No respiratory distress.     Breath sounds: Normal breath sounds. No stridor. No wheezing, rhonchi or rales.  Abdominal:     General: There is no distension or abdominal bruit.     Palpations: Abdomen is soft.     Tenderness: There is no abdominal tenderness. There is no guarding or rebound.     Comments: Large  pannus   Musculoskeletal:     Cervical back: Normal range of motion and neck supple.     Right lower leg: No edema.     Left lower leg: No edema.  Lymphadenopathy:     Cervical: No cervical adenopathy.  Skin:    General: Skin is warm and dry.     Coloration: Skin is not pale.     Findings: No rash.     Comments: 3 shallow linear skin wounds/splits under pannus on far L   They appear to be from tension No erythema or satellite lesions or excoriations     Neurological:     Mental Status: She is alert.     Cranial Nerves: No cranial nerve deficit.     Coordination: Coordination normal.     Deep Tendon Reflexes: Reflexes are normal and symmetric. Reflexes normal.  Psychiatric:        Mood and Affect: Mood is anxious. Affect  is tearful.        Cognition and Memory: Cognition normal.     Comments: Mildly anxious   Candidly discusses symptoms and stressors                Assessment & Plan:   Problem List Items Addressed This Visit       Digestive   GERD    Worse recently  Suspect due to inc dose of mounjaro Pt would like to see if any improvement in 2 more weeks Disc avoiding triggers Small meals with time to digest Continue omeprazole 20 mg bid  Can add pepcid 10 mg otc prn or antacid         Musculoskeletal and Integument   Candidal intertrigo    This is in good control currently        Other   Abrasion - Primary    Pt has linear skin tears under pannus on L  Suspect due to friction/tension when cleaning   Disc wound care with soap/water and dry well  Antibiotic ointment to protect and heal  (barrier cream would also be ok) Try to be gentle  Update if not starting to improve in a week or if worsening        Morbid obesity ([place])    Continues to loose wt with better diet and mounjaro Planning counseling for emotional eating issues

## 2021-10-10 NOTE — Assessment & Plan Note (Signed)
Pt has linear skin tears under pannus on L  Suspect due to friction/tension when cleaning   Disc wound care with soap/water and dry well  Antibiotic ointment to protect and heal  (barrier cream would also be ok) Try to be gentle  Update if not starting to improve in a week or if worsening

## 2021-10-10 NOTE — Assessment & Plan Note (Signed)
Worse recently  Suspect due to inc dose of mounjaro Pt would like to see if any improvement in 2 more weeks Disc avoiding triggers Small meals with time to digest Continue omeprazole 20 mg bid  Can add pepcid 10 mg otc prn or antacid

## 2021-10-10 NOTE — Assessment & Plan Note (Signed)
This is in good control currently

## 2021-10-10 NOTE — Assessment & Plan Note (Signed)
Continues to loose wt with better diet and mounjaro Planning counseling for emotional eating issues

## 2021-10-10 NOTE — Patient Instructions (Addendum)
Try to eat the things that do not bother your GI system  See how you do 2 more weeks with mounjaro  If you are not tolerating it then you may need to drop the dose   You can add pepcid 10 mg over the counter as needed for heartburn Continue the omeprazole   Try to sip water through the day to get more fluid in   You have a little skin tear/abrasion on the Left abdomen/groin under skin fold Keep very clean with soap and water and dry well  Use an antibiotic ointment (triple, poly or neosporin) Try to avoid trauma to the area as well  Update if not starting to improve in a week or if worsening

## 2021-10-11 ENCOUNTER — Ambulatory Visit: Payer: Medicare PPO | Admitting: Family Medicine

## 2021-10-17 ENCOUNTER — Ambulatory Visit: Payer: Medicare PPO | Admitting: Clinical

## 2021-10-17 DIAGNOSIS — F411 Generalized anxiety disorder: Secondary | ICD-10-CM
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
## 2021-10-17 NOTE — Progress Notes (Unsigned)
                Loyalty Arentz, LCSW 

## 2021-10-26 NOTE — Telephone Encounter (Signed)
Pt was seen by [facility] 10/17/21  Nothing further needed.

## 2021-11-01 ENCOUNTER — Ambulatory Visit: Payer: Medicare PPO | Admitting: Clinical

## 2021-11-02 DIAGNOSIS — H6123 Impacted cerumen, bilateral: Secondary | ICD-10-CM | POA: Diagnosis not present

## 2021-11-02 DIAGNOSIS — R221 Localized swelling, mass and lump, neck: Secondary | ICD-10-CM | POA: Diagnosis not present

## 2021-11-03 ENCOUNTER — Other Ambulatory Visit: Payer: Self-pay | Admitting: Unknown Physician Specialty

## 2021-11-03 DIAGNOSIS — R221 Localized swelling, mass and lump, neck: Secondary | ICD-10-CM

## 2021-11-07 ENCOUNTER — Ambulatory Visit: Payer: Medicare PPO | Admitting: Dermatology

## 2021-11-07 ENCOUNTER — Other Ambulatory Visit: Payer: Self-pay | Admitting: Family Medicine

## 2021-11-07 DIAGNOSIS — L649 Androgenic alopecia, unspecified: Secondary | ICD-10-CM

## 2021-11-07 DIAGNOSIS — L650 Telogen effluvium: Secondary | ICD-10-CM | POA: Diagnosis not present

## 2021-11-07 DIAGNOSIS — L820 Inflamed seborrheic keratosis: Secondary | ICD-10-CM

## 2021-11-07 MED ORDER — MINOXIDIL 2.5 MG PO TABS: 1.2500 mg | ORAL_TABLET | Freq: Every day | ORAL | 1 refills | Status: DC

## 2021-11-07 NOTE — Patient Instructions (Addendum)
Doses of minoxidil for hair loss are considered 'low dose'. This is because the doses used for hair loss are a lot lower than the doses which are used for conditions such as high blood pressure (hypertension). The doses used for hypertension are 10-'40mg'$  per day.  Side effects are uncommon at the low doses (up to 2.5 mg/day) used to treat hair loss. Potential side effects, more commonly seen at higher doses, include: Increase in hair growth (hypertrichosis) elsewhere on face and body Temporary hair shedding upon starting medication which may last up to 4 weeks Ankle swelling, fluid retention, rapid weight gain more than 5 pounds Low blood pressure and feeling lightheaded or dizzy when standing up quickly Fast or irregular heartbeat Headaches   Cryotherapy Aftercare  Wash gently with soap and water everyday.  (Right forearm) Apply Vaseline and Band-Aid daily until healed.   Due to recent changes in healthcare laws, you may see results of your pathology and/or laboratory studies on MyChart before the doctors have had a chance to review them. We understand that in some cases there may be results that are confusing or concerning to you. Please understand that not all results are received at the same time and often the doctors may need to interpret multiple results in order to provide you with the best plan of care or course of treatment. Therefore, we ask that you please give [place] 2 business days to thoroughly review all your results before contacting the office for clarification. Should we see a critical lab result, you will be contacted sooner.   If You Need Anything After Your Visit  If you have any questions or concerns for your doctor, please call our main line at [phone] and press option 4 to reach your doctor's medical assistant. If no one answers, please leave a voicemail as directed and we will return your call as soon as possible. Messages left after 4 pm will be answered the following  business day.   You may also send [place] a message via [place]. We typically respond to MyChart messages within 1-2 business days.  For prescription refills, please ask your pharmacy to contact our office. Our fax number is [phone].  If you have an urgent issue when the clinic is closed that cannot wait until the next business day, you can page your doctor at the number below.    Please note that while we do our best to be available for urgent issues outside of office hours, we are not available 24/7.   If you have an urgent issue and are unable to reach [place], you may choose to seek medical care at your doctor's office, retail clinic, urgent care center, or emergency room.  If you have a medical emergency, please immediately call 911 or go to the emergency department.  Pager Numbers  - Dr. Nehemiah Massed: [phone]  - Dr. Laurence Ferrari: [phone]  - Dr. Nicole Kindred: [phone]  In the event of inclement weather, please call our main line at [phone] for an update on the status of any delays or closures.  Dermatology Medication Tips: Please keep the boxes that topical medications come in in order to help keep track of the instructions about where and how to use these. Pharmacies typically print the medication instructions only on the boxes and not directly on the medication tubes.   If your medication is too expensive, please contact our office at [phone] option 4 or send [place] a message through [place].   We are unable to tell what  your co-pay for medications will be in advance as this is different depending on your insurance coverage. However, we may be able to find a substitute medication at lower cost or fill out paperwork to get insurance to cover a needed medication.   If a prior authorization is required to get your medication covered by your insurance company, please allow [place] 1-2 business days to complete this process.  Drug prices often vary depending on where the prescription is  filled and some pharmacies may offer cheaper prices.  The website www.goodrx.com contains coupons for medications through different pharmacies. The prices here do not account for what the cost may be with help from insurance (it may be cheaper with your insurance), but the website can give you the price if you did not use any insurance.  - You can print the associated coupon and take it with your prescription to the pharmacy.  - You may also stop by our office during regular business hours and pick up a GoodRx coupon card.  - If you need your prescription sent electronically to a different pharmacy, notify our office through [facility] or by phone at [phone] option 4.     Si Usted Necesita Algo Despus de Su Visita  Tambin puede enviarnos un mensaje a travs de Pharmacist, community. Por lo general respondemos a los mensajes de MyChart en el transcurso de 1 a 2 das hbiles.  Para renovar recetas, por favor pida a su farmacia que se ponga en contacto con nuestra oficina. Harland Dingwall de fax es [place] [phone].  Si tiene un asunto urgente cuando la clnica est cerrada y que no puede esperar hasta el siguiente da hbil, puede llamar/localizar a su doctor(a) al nmero que aparece a continuacin.   Por favor, tenga en cuenta que aunque hacemos todo lo posible para estar disponibles para asuntos urgentes fuera del horario de [place], no estamos disponibles las 24 horas del da, los 7 das de la [place].   Si tiene un problema urgente y no puede comunicarse con nosotros, puede optar por buscar atencin mdica  en el consultorio de su doctor(a), en una clnica privada, en un centro de atencin urgente o en una sala de emergencias.  Si tiene Engineering geologist, por favor llame inmediatamente al 911 o vaya a la sala de emergencias.  Nmeros de bper  - Dr. Nehemiah Massed: [phone]  - Dra. Moye: [phone]  - Dra. Nicole Kindred: [phone]  En caso de inclemencias del [place], por favor llame  a Johnsie Kindred principal al [phone] para una actualizacin sobre el [place] de cualquier retraso o cierre.  Consejos para la medicacin en dermatologa: Por favor, guarde las cajas en las que vienen los medicamentos de uso tpico para ayudarle a seguir las instrucciones sobre dnde y cmo usarlos. Las farmacias generalmente imprimen las instrucciones del medicamento slo en las cajas y no directamente en los tubos del [place].   Si su medicamento es muy caro, por favor, pngase en contacto con Zigmund Daniel llamando al [phone] y presione la opcin 4 o envenos un mensaje a travs de Pharmacist, community.   No podemos decirle cul ser su copago por los medicamentos por adelantado ya que esto es diferente dependiendo de la cobertura de su seguro. Sin embargo, es posible que podamos encontrar un medicamento sustituto a Electrical engineer un formulario para que el seguro cubra el medicamento que se considera necesario.   Si se requiere una autorizacin previa para que su compaa de seguros [place] su medicamento,  por favor permtanos de 1 a 2 das hbiles para completar este proceso.  Los precios de los medicamentos varan con frecuencia dependiendo del Environmental consultant de dnde se surte la receta y alguna farmacias pueden ofrecer precios ms baratos.  El sitio web www.goodrx.com tiene cupones para medicamentos de Airline pilot. Los precios aqu no tienen en cuenta lo que podra costar con la ayuda del seguro (puede ser ms barato con su seguro), pero el sitio web puede darle el precio si no utiliz Research scientist (physical sciences).  - Puede imprimir el cupn correspondiente y llevarlo con su receta a la farmacia.  - Tambin puede pasar por nuestra oficina durante el horario de atencin regular y Charity fundraiser una tarjeta de cupones de GoodRx.  - Si necesita que su receta se enve electrnicamente a una farmacia diferente, informe a nuestra oficina a travs de MyChart de [place] o por telfono llamando al [phone] y  presione la opcin 4.

## 2021-11-07 NOTE — [place] Notes (Signed)
Follow-Up Visit   Subjective  Mackenzie Bonilla is a 56 y.o. female who presents for the following: Hairloss (X 1 1/2-2 years. Patient had Covid, but it was about a year after she had Covid that hair loss started. She has had a 50 lb weight loss, but hair loss started before. Mom has thinning hair. + stress. ). Not itchy or sore. Was losing handfuls of hair, but that has improved, but hair is much thinner. She has PCOS. She also has a growth on her right forearm that she picks at.     The following portions of the chart were reviewed this encounter and updated as appropriate:       Review of Systems:  No other skin or systemic complaints except as noted in HPI or Assessment and Plan.  Objective  Well appearing patient in no apparent distress; mood and affect are within normal limits.  A focused examination was performed including face, scalp, arm. Relevant physical exam findings are noted in the Assessment and Plan.  Right Forearm Erythematous stuck-on, waxy papule  Scalp Diffuse thinning of the crown and widening of the midline part with retention of the frontal hairline - Reviewed progressive nature and prognosis.          Assessment & Plan  Inflamed seborrheic keratosis Right Forearm  Symptomatic, irritating, patient would like treated.  Destruction of lesion - Right Forearm  Destruction method: cryotherapy   Informed consent: discussed and consent obtained   Lesion destroyed using liquid nitrogen: Yes   Region frozen until ice ball extended beyond lesion: Yes   Outcome: patient tolerated procedure well with no complications   Post-procedure details: wound care instructions given   Additional details:  Prior to procedure, discussed risks of blister formation, small wound, skin dyspigmentation, or rare scar following cryotherapy. Recommend Vaseline ointment to treated areas while healing.   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  Female Androgenic Alopecia is a chronic condition related to genetics and/or hormonal changes.  In women androgenetic alopecia is commonly associated with menopause but may occur any time after puberty.  It causes hair thinning primarily on the crown with widening of the part and temporal hairline recession.  Can use OTC Rogaine (minoxidil) 5% solution/foam as directed.  Oral treatments in female patients who have no contraindication may include : - Low dose oral minoxidil 1.25 - '5mg'$  daily - Spironolactone 50 - '100mg'$  bid - Finasteride 2.5 - 5 mg daily Adjunctive therapies include: - Low Level Laser Light Therapy (LLLT) - Platelet-rich plasma injections (PRP) - Hair Transplants or scalp reduction  Patient has PCOS and patient is currently taking Spironolactone '100mg'$  daily. Patient will discuss with her doctor, could consider increasing 150 mg.  TSH from 06/19/21 was normal.  BP 101/66  Start minoxidil 2.'5mg'$  take 1/2 tablet by mouth daily dsp #90 1Rf.  Doses of minoxidil for hair loss are considered 'low dose'. This is because the doses used for hair loss are a lot lower than the doses which are used for conditions such as high blood pressure (hypertension). The doses used for hypertension are 10-'40mg'$  per day.  Side effects are uncommon at the low doses (up to 2.5 mg/day) used to treat hair loss. Potential side effects, more commonly seen at higher doses, include: Increase in hair growth (hypertrichosis) elsewhere on face and body Temporary hair shedding upon starting medication which may  last up to 4 weeks Ankle swelling, fluid retention, rapid weight gain more than 5 pounds Low blood pressure and feeling lightheaded or dizzy when standing up quickly Fast or irregular heartbeat Headaches    minoxidil (LONITEN) 2.5 MG tablet - Scalp Take 0.5 tablets (1.25 mg total) by mouth daily.   Return in about 3 months (around  02/06/2022) for alopecia.  IJamesetta Orleans, CMA, am acting as scribe for Brendolyn Patty, MD .  Documentation: I have reviewed the above documentation for accuracy and completeness, and I agree with the above.  Brendolyn Patty MD

## 2021-11-08 DIAGNOSIS — E1142 Type 2 diabetes mellitus with diabetic polyneuropathy: Secondary | ICD-10-CM | POA: Diagnosis not present

## 2021-11-10 ENCOUNTER — Ambulatory Visit
Admission: EM | Admit: 2021-11-10 | Discharge: 2021-11-10 | Disposition: A | Discharge status: Home / Self Care | Payer: Medicare PPO | Attending: Urgent Care | Admitting: Urgent Care

## 2021-11-10 ENCOUNTER — Telehealth: Payer: Self-pay | Admitting: Family Medicine

## 2021-11-10 ENCOUNTER — Other Ambulatory Visit: Payer: Self-pay | Admitting: Family Medicine

## 2021-11-10 DIAGNOSIS — R300 Dysuria: Secondary | ICD-10-CM | POA: Diagnosis not present

## 2021-11-10 DIAGNOSIS — N3001 Acute cystitis with hematuria: Secondary | ICD-10-CM | POA: Diagnosis not present

## 2021-11-10 LAB — POCT URINALYSIS DIP (MANUAL ENTRY)
Bilirubin, UA: NEGATIVE
Glucose, UA: NEGATIVE mg/dL
Ketones, POC UA: NEGATIVE mg/dL
Nitrite, UA: NEGATIVE
Protein Ur, POC: NEGATIVE mg/dL
Spec Grav, UA: 1.02 (ref 1.010–1.025)
Urobilinogen, UA: 0.2 E.U./dL
pH, UA: 6 (ref 5.0–8.0)

## 2021-11-10 MED ORDER — NITROFURANTOIN MONOHYD MACRO 100 MG PO CAPS: 100.0000 mg | ORAL_CAPSULE | Freq: Two times a day (BID) | ORAL | 0 refills | Status: DC

## 2021-11-10 MED ORDER — NITROFURANTOIN MONOHYD MACRO 100 MG PO CAPS: 100.0000 mg | ORAL_CAPSULE | Freq: Two times a day (BID) | ORAL | Status: DC

## 2021-11-10 NOTE — Telephone Encounter (Signed)
Called pt, to schedule appt. Pt stated she did not want an appt now, just advice.

## 2021-11-10 NOTE — ED Triage Notes (Signed)
Pt. States for the past couple of days she has been having some cloudy urine. Pt. States her primary care provider requested she come to urgent care to check for a UTI since she is a diabetic. Pt. Also mentioned hx of kidneys stones and had some nausea, vomiting and back pain that occurred last week but has cleared up since.

## 2021-11-10 NOTE — Telephone Encounter (Signed)
Pt states she thinks she might've passed a few kidney stones last week (11/03/21). Pt was throwing up a lot. Pt also has to urinate frequently, she's wondering if she has a uti? There's no pain or anything, just has to urinate a lot more than usual. Pt states that the swelling in her left foot & ankle has went down, a lot. Pt thinks it might be due to her urinating more than usual yesterday, 11/09/21. Pt stated she's loosing hair in the back of her head, she was put ninoxidil (2.5, instructed by dermatologist to only take half of 2.5),. Pt stated she was told the meds would maybe kick in within 2/3 months to see a change. Pt wanted to know if she needs to schedule ov for uti & kidney stone passing? Call back # [phone].

## 2021-11-10 NOTE — Discharge Instructions (Addendum)
Follow up here or with your primary care provider if your symptoms are worsening or not improving with treatment.     

## 2021-11-10 NOTE — Telephone Encounter (Signed)
Will route to PCP, please see prev messages

## 2021-11-10 NOTE — Telephone Encounter (Signed)
Pt notified of PCP's comments and that we can't give advise on UTI/ Kidney stone issues and that she needs to go to UC since no more available appts here at the office. Pt verbalized understanding

## 2021-11-10 NOTE — Telephone Encounter (Signed)
Needs to be seen by first available for possible uti/kidney stone or UC   I would not let uti symptoms go over the weekend

## 2021-11-10 NOTE — Telephone Encounter (Signed)
Please schedule with any available provider (PCP has no openings)

## 2021-11-10 NOTE — ED Provider Notes (Signed)
Roderic Palau    CSN: [id number] Arrival date & time: 11/10/21  1319      History   Chief Complaint No chief complaint on file.   HPI Mackenzie Bonilla is a 56 y.o. female.   HPI  Presents to urgent care with symptoms times a "couple of days".  Reports cloudy urine and frequency yesterday.  She also reports recent history of nausea, vomiting and back pain occurring last week but since resolved.  History of kidney stones.  History of DM2.  Past Medical History:  Diagnosis Date   Allergy    allergic rhinitis   Anemia    Anxiety    Arthritis    osteoarthritis   Asthma    Claustrophobia    does not like oxygen mask covering face   Depression    Diabetes mellitus without complication ([place])    Fall 2016   Fatty liver 07/2008   abd. ultrasound - fatty liver ; slt dilated cbd (no stones ) 06/10// abd.  ultrasound normal on 04/2006   Fibromyalgia    GERD (gastroesophageal reflux disease)    EGD negative 06/2001// EGD erythematous mucosa, polyp 08/2008   High uric acid in 24 hour urine specimen    Hyperhidrosis    Hyperlipidemia    Kidney stones    Lymphedema    Obesity    PCOS (polycystic ovarian syndrome)    Shortness of breath dyspnea    Tachycardia    TMJ (dislocation of temporomandibular joint)    UTI (lower urinary tract infection)     Patient Active Problem List   Diagnosis Date Noted   Abrasion 10/10/2021   Keratosis 08/31/2021   Cystitis with hematuria 08/25/2021   Frequent urination 07/05/2021   Yeast vaginitis 07/05/2021   Hair loss 06/19/2021   Generalized abdominal pain 06/13/2021   Urinary frequency 06/13/2021   Abscess of left buttock 06/02/2021   Wound with tunneling 06/02/2021   Breast pain, right 12/20/2020   Screening mammogram for breast cancer 12/13/2020   Fatigue 06/15/2020   Microscopic hematuria 04/19/2020   Lumbar facet arthropathy 12/29/2019   Chronic bilateral thoracic back pain 12/08/2019   Lumbar degenerative disc  disease 12/08/2019   Fibromyalgia 12/08/2019   Chronic pain syndrome 12/08/2019   Lumbar radicular pain (left S1) 12/08/2019   Mid back pain 02/11/2019   Multiple joint pain 11/12/2018   UTI (urinary tract infection) 04/08/2018   Eating disorder 05/23/2017   Gallstones 04/19/2017   Diabetic polyneuropathy associated with type 2 diabetes mellitus ([place]) 04/07/2017   Diabetic angiopathy ([place]) 04/07/2017   Vasomotor symptoms due to menopause 04/07/2017   Mobility impaired 11/12/2016   Urinary incontinence 12/02/2015   Candidal intertrigo 04/06/2015   Cystocele 04/06/2015   Constipation 04/06/2015   Acute sinusitis 12/28/2014   Myofascial pain syndrome of lumbar spine 12/28/2014   Hypertriglyceridemia 10/14/2014   Breast mass in female 07/28/2014   Right knee pain 07/23/2013   Fall 07/23/2013   Medication management 11/20/2012   Uric acid kidney stone 11/20/2011   HYPERHIDROSIS 11/07/2009   Menopausal symptoms 06/17/2009   Type 2 diabetes mellitus without complication, without long-term current use of insulin ([place]) 08/23/2008   Vitamin D deficiency 05/07/2008   Vitamin B 12 deficiency 06/04/2007   CHRONIC FATIGUE SYNDROME 11/15/2006   DIVERTICULITIS, HX OF 11/15/2006   PCOS (polycystic ovarian syndrome) 10/16/2006   Hyperlipidemia associated with type 2 diabetes mellitus ([place]) 10/16/2006   Morbid obesity ([place]) 10/16/2006   Generalized anxiety disorder 10/16/2006  PANIC DISORDER 10/16/2006   BULIMIA 10/16/2006   Chronic depression 10/16/2006   CARPAL TUNNEL SYNDROME 10/16/2006   LYMPHEDEMA 10/16/2006   Allergic rhinitis 10/16/2006   Mild intermittent asthma 10/16/2006   TMJ SYNDROME 10/16/2006   GERD 10/16/2006   IRRITABLE BOWEL SYNDROME 10/16/2006   Osteoarthritis 10/16/2006   COUGH, CHRONIC 10/16/2006    Past Surgical History:  Procedure Laterality Date   BREAST BIOPSY Right 2016   neg   BREAST LUMPECTOMY WITH RADIOACTIVE SEED LOCALIZATION Right 08/02/2014    Procedure: BREAST LUMPECTOMY WITH RADIOACTIVE SEED LOCALIZATION;  Surgeon: Rolm Bookbinder, MD;  Location: [facility];  Service: General;  Laterality: Right;   COLONOSCOPY  08/12/2012   Completed by Dr. Phylis Bougie, normal exam.    ENDOMETRIAL BIOPSY  01/2004   ESOPHAGOGASTRODUODENOSCOPY     ESOPHAGOGASTRODUODENOSCOPY (EGD) WITH PROPOFOL N/A 08/18/2019   Procedure: ESOPHAGOGASTRODUODENOSCOPY (EGD) WITH PROPOFOL;  Surgeon: Lucilla Lame, MD;  Location: [facility] ENDOSCOPY;  Service: Endoscopy;  Laterality: N/A;   FOOT SURGERY     SEPTOPLASTY     two times   TONSILLECTOMY     UTERINE FIBROID SURGERY  06/2005   ablation   uterine tumor  09/2001   VAGUS NERVE STIMULATOR INSERTION      OB History     Gravida  0   Para  0   Term  0   Preterm  0   AB  0   Living  0      SAB  0   IAB  0   Ectopic  0   Multiple  0   Live Births  0        Obstetric Comments  1st Menstrual Cycle:  12             Home Medications    Prior to Admission medications   Medication Sig Start Date End Date Taking? Authorizing Provider  acetaminophen (TYLENOL) 500 MG tablet Take 500 mg by mouth every 6 (six) hours as needed.     [provider]  albuterol (VENTOLIN HFA) 108 (90 Base) MCG/ACT inhaler INHALE 2 PUFFS INTO THE LUNGS EVERY 6 HOURS AS NEEDED FOR WHEEZING. 08/09/21   Tower, Wynelle Fanny, MD  ALPRAZolam Duanne Moron) 1 MG tablet Take 2 mg by mouth 3 (three) times daily as needed.    [provider]  ammonium lactate (AMLACTIN) 12 % cream Apply 1 application. topically in the morning and at bedtime. For dry skin 06/12/21   Brendolyn Patty, MD  Blood Glucose Monitoring Suppl (FIFTY50 GLUCOSE METER 2.0) w/Device KIT Use as directed 07/02/18   [provider]  buPROPion (WELLBUTRIN XL) 150 MG 24 hr tablet Take 150 mg by mouth daily.    [provider]  Cholecalciferol (VITAMIN D3 PO) Take 2,000 Units by mouth daily.    [provider]  clindamycin (CLEOCIN-T) 1 % lotion  Apply topically daily. Qd to bumps on face and chest 02/01/21 02/01/22  Ralene Bathe, MD  cyclobenzaprine (FLEXERIL) 5 MG tablet Take 1 tablet (5 mg total) by mouth 3 (three) times daily as needed for muscle spasms. 08/04/20   [name], MD  desvenlafaxine (PRISTIQ) 100 MG 24 hr tablet Take 100 mg by mouth daily. 03/12/18   [provider]  dimenhyDRINATE (DRAMAMINE) 50 MG tablet Take 50 mg by mouth every 8 (eight) hours as needed.    [provider]  Docusate Calcium (STOOL SOFTENER PO) Take 1-2 tablets by mouth daily as needed.    [provider]  fluticasone (FLONASE) 50 MCG/ACT nasal spray Place into both nostrils daily.    [provider]  gabapentin (NEURONTIN) 100 MG capsule TAKE 2 CAPSULES BY MOUTH EVERY MORNING AND 3 CAPSULES EVERY EVENING 07/06/21   Edrick Kins, DPM  gabapentin (NEURONTIN) 300 MG capsule TAKE 1 CAPSULE AT BEDTIME 10/02/21   Evans, Dorathy Daft, DPM  ibuprofen (ADVIL) 600 MG tablet Take 1 tablet (600 mg total) by mouth every 8 (eight) hours as needed. 09/30/19   Lamptey, Myrene Galas, MD  Insulin Degludec (TRESIBA [place]) Inject into the skin. 1 injection nightly 8-10 pm    [provider]  ketoconazole (NIZORAL) 2 % cream Apply 1 application topically daily. 04/12/21   Tower, Wynelle Fanny, MD  losartan (COZAAR) 25 MG tablet  05/23/17   [provider]  minoxidil (LONITEN) 2.5 MG tablet Take 0.5 tablets (1.25 mg total) by mouth daily. 11/07/21   Brendolyn Patty, MD  nystatin cream (MYCOSTATIN) APPLY TO AFFECTED AREAS 3 TIMES DAILY ASNEEDED 04/10/21   Tower, Wynelle Fanny, MD  omeprazole (PRILOSEC) 20 MG capsule TAKE 1 CAPSULE BY MOUTH 2 TIMES DAILY. 08/08/21   Tower, Wynelle Fanny, MD  OVER THE COUNTER MEDICATION Take 1 capsule by mouth daily. BARIATRIC MULTIVITAMIN IRON FREE    [provider]  oxybutynin (DITROPAN XL) 15 MG 24 hr tablet TAKE 1 TABLET BY MOUTH DAILY 05/15/21   Tower, Wynelle Fanny, MD  Polyethyl Glycol-Propyl Glycol (SYSTANE OP)  Apply 1 drop to eye daily as needed.    [provider]  pravastatin (PRAVACHOL) 20 MG tablet 1 TABLET BY MOUTH AT BEDTIME. 05/25/21   Tower, Wynelle Fanny, MD  psyllium (METAMUCIL) 58.6 % powder Take 1 packet by mouth 2 (two) times daily.    [provider]  Simethicone (GAS-X PO) Take by mouth as needed.    [provider]  spironolactone (ALDACTONE) 100 MG tablet TAKE 1 TABLET BY MOUTH DAILY 11/08/21   Tower, Wynelle Fanny, MD  tirzepatide [facility]) 7.5 MG/0.5ML Pen Inject 12.5 mg into the skin once a week. 10/01/20   [provider]  traMADol (ULTRAM) 50 MG tablet TAKE ONE TABLET TWICE A DAY IF NEEDED FOR SEVERE PAIN 09/20/20   Tower, Wynelle Fanny, MD  traZODone (DESYREL) 150 MG tablet Take 300 mg by mouth at bedtime.  05/20/14   [provider]  zinc oxide (BALMEX) 11.3 % CREA cream Apply 1 application topically 2 (two) times daily.    [provider]    Family History Family History  Problem Relation Age of Onset   Hypertension Father    Cancer Father        pancreatic cancer   Hypertension Sister    Heart disease Maternal Grandmother 89       MI   Diabetes Maternal Grandmother    Heart disease Paternal Grandmother 54       MI   Diabetes Paternal Grandmother    Breast cancer Neg Hx     Social History Social History   Tobacco Use   Smoking status: Never   Smokeless tobacco: Never  Vaping Use   Vaping Use: Never used  Substance Use Topics   Alcohol use: No    Alcohol/week: 0.0 standard drinks of alcohol   Drug use: No     Allergies   Cephalexin, Codeine, Duloxetine, Levaquin [levofloxacin], Lisinopril, Metformin and related, Quetiapine, Sertraline hcl, Triazolam, Zaleplon, Zocor [simvastatin - high dose], Zolpidem tartrate, Hydrocodone, and Norelgestromin-eth estradiol   Review of Systems Review of Systems  Physical Exam Triage Vital Signs ED Triage Vitals  Enc Vitals Group     BP      Pulse      Resp      Temp      Temp  src      SpO2      Weight      Height      Head Circumference      Peak Flow      Pain Score      Pain Loc      Pain Edu?      Excl. in [place]?    No data found.  Updated Vital Signs There were no vitals taken for this visit.  Visual Acuity Right Eye Distance:   Left Eye Distance:   Bilateral Distance:    Right Eye Near:   Left Eye Near:    Bilateral Near:     Physical Exam Vitals reviewed.  Constitutional:      Appearance: Normal appearance.  Neurological:     General: No focal deficit present.     Mental Status: She is alert and oriented to person, place, and time.  Psychiatric:        Mood and Affect: Mood normal.        Behavior: Behavior normal.      UC Treatments / Results  Labs (all labs ordered are listed, but only abnormal results are displayed) Labs Reviewed  POCT URINALYSIS DIP (MANUAL ENTRY)    EKG   Radiology No results found.  Procedures Procedures (including critical care time)  Medications Ordered in UC Medications - No data to display  Initial Impression / Assessment and Plan / UC Course  I have reviewed the triage vital signs and the nursing notes.  Pertinent labs & imaging results that were available during my care of the patient were reviewed by me and considered in my medical decision making (see chart for details).   UA positive for large leukocytes and trace blood.  Will treat for acute cystitis with hematuria.  successfully treated in July with Macrobid x5 days.  Will order the same.  Sending for culture to verify susceptibility.   Final Clinical Impressions(s) / UC Diagnoses   Final diagnoses:  Dysuria   Discharge Instructions   None    ED Prescriptions   None    PDMP not reviewed this encounter.   Rose Phi, [place] 11/10/21 1430

## 2021-11-13 LAB — URINE CULTURE: Culture: >=100000 — AB

## 2021-11-13 NOTE — Telephone Encounter (Signed)
Pt called stating she did visit urgent care & it was confirmed she did have a uti. Scheduled ov with tower for 11/15/21

## 2021-11-14 ENCOUNTER — Encounter: Payer: Self-pay | Admitting: Podiatry

## 2021-11-14 ENCOUNTER — Ambulatory Visit
Admission: RE | Admit: 2021-11-14 | Discharge: 2021-11-14 | Disposition: A | Discharge status: Home / Self Care | Payer: Medicare PPO | Source: Ambulatory Visit | Attending: Unknown Physician Specialty | Admitting: Unknown Physician Specialty

## 2021-11-14 DIAGNOSIS — R221 Localized swelling, mass and lump, neck: Secondary | ICD-10-CM | POA: Diagnosis not present

## 2021-11-14 MED ORDER — IOPAMIDOL (ISOVUE-300) INJECTION 61%
100.0000 mL | Freq: Once | INTRAVENOUS | Status: AC | PRN
  Administered 2021-11-14: 100 mL via INTRAVENOUS

## 2021-11-15 ENCOUNTER — Ambulatory Visit (INDEPENDENT_AMBULATORY_CARE_PROVIDER_SITE_OTHER): Payer: Medicare PPO | Admitting: Clinical

## 2021-11-15 ENCOUNTER — Other Ambulatory Visit: Payer: Self-pay | Admitting: Podiatry

## 2021-11-15 ENCOUNTER — Encounter: Payer: Self-pay | Admitting: Family Medicine

## 2021-11-15 ENCOUNTER — Ambulatory Visit: Payer: Medicare PPO | Admitting: Family Medicine

## 2021-11-15 VITALS — BP 112/64 | HR 83 | Temp 97.3°F | Ht 64.0 in | Wt 280.2 lb

## 2021-11-15 DIAGNOSIS — L600 Ingrowing nail: Secondary | ICD-10-CM

## 2021-11-15 DIAGNOSIS — F411 Generalized anxiety disorder: Secondary | ICD-10-CM | POA: Diagnosis not present

## 2021-11-15 DIAGNOSIS — N3091 Cystitis, unspecified with hematuria: Secondary | ICD-10-CM | POA: Diagnosis not present

## 2021-11-15 DIAGNOSIS — M778 Other enthesopathies, not elsewhere classified: Secondary | ICD-10-CM

## 2021-11-15 DIAGNOSIS — N3000 Acute cystitis without hematuria: Secondary | ICD-10-CM | POA: Diagnosis not present

## 2021-11-15 DIAGNOSIS — M9905 Segmental and somatic dysfunction of pelvic region: Secondary | ICD-10-CM | POA: Diagnosis not present

## 2021-11-15 DIAGNOSIS — R202 Paresthesia of skin: Secondary | ICD-10-CM | POA: Insufficient documentation

## 2021-11-15 DIAGNOSIS — M5431 Sciatica, right side: Secondary | ICD-10-CM | POA: Diagnosis not present

## 2021-11-15 DIAGNOSIS — M9904 Segmental and somatic dysfunction of sacral region: Secondary | ICD-10-CM | POA: Diagnosis not present

## 2021-11-15 DIAGNOSIS — M9903 Segmental and somatic dysfunction of lumbar region: Secondary | ICD-10-CM | POA: Diagnosis not present

## 2021-11-15 LAB — POC URINALSYSI DIPSTICK (AUTOMATED)
Bilirubin, UA: NEGATIVE
Blood, UA: NEGATIVE
Glucose, UA: NEGATIVE
Ketones, UA: NEGATIVE
Nitrite, UA: POSITIVE
Protein, UA: NEGATIVE
Spec Grav, UA: 1.02 (ref 1.010–1.025)
Urobilinogen, UA: 0.2 E.U./dL
pH, UA: 6 (ref 5.0–8.0)

## 2021-11-15 NOTE — Progress Notes (Signed)
Subjective:    Patient ID: Mackenzie Bonilla, female    DOB: 1965/05/02, 56 y.o.   MRN: [id number]  HPI Pt presents for f/u of UTI   Did wake up with r fingertips tingling (improving some now)  Wakes up sore from a bad mattress also - ends up rolling to the middle  R great toe pain-thinks nail is ingrown   Wt Readings from Last 3 Encounters:  11/15/21 280 lb 4 oz (127.1 kg)  10/10/21 283 lb 2 oz (128.4 kg)  08/31/21 287 lb (130.2 kg)   48.10 kg/m   Had visit to UC for urinary symptoms  Cloudy urine and frequency and dysuria  Nausea and back pain the week prior  Ua noted large leuk and tr blood  Tx with macrobid (was also used for uti in July)  Feels like she may have passed a kidney stone prior to that  Was using ibuprofen and salon patches for this  It did hurt to wipe a few times   Culture noted e coli resistant to cipro  She is still taking abx  Taking abx now   Has one pill left of abx   No more nausea than usual  No more vomiting   No burning to urinate    Some constipation  Stool is pasty and hard to clean   Hard for her to wipe front to back   Is drinking fluids / does her best with water   Ua today:   Results for orders placed or performed in visit on 11/15/21  POCT Urinalysis Dipstick (Automated)  Result Value Ref Range   Color, UA Amber    Clarity, UA Cloudy    Glucose, UA Negative Negative   Bilirubin, UA Negative    Ketones, UA Negative    Spec Grav, UA 1.020 1.010 - 1.025   Blood, UA Negative    pH, UA 6.0 5.0 - 8.0   Protein, UA Negative Negative   Urobilinogen, UA 0.2 0.2 or 1.0 E.U./dL   Nitrite, UA Positive    Leukocytes, UA Large (3+) (A) Negative    Had a CT neck yesterday  Sees ENT - had a lump in L neck   Patient Active Problem List   Diagnosis Date Noted   Paresthesia 11/15/2021   Ingrown nail of great toe of left foot 11/15/2021   Abrasion 10/10/2021   Keratosis 08/31/2021   Cystitis with hematuria 08/25/2021    Frequent urination 07/05/2021   Yeast vaginitis 07/05/2021   Hair loss 06/19/2021   Generalized abdominal pain 06/13/2021   Urinary frequency 06/13/2021   Abscess of left buttock 06/02/2021   Wound with tunneling 06/02/2021   Breast pain, right 12/20/2020   Screening mammogram for breast cancer 12/13/2020   Fatigue 06/15/2020   Microscopic hematuria 04/19/2020   Lumbar facet arthropathy 12/29/2019   Chronic bilateral thoracic back pain 12/08/2019   Lumbar degenerative disc disease 12/08/2019   Fibromyalgia 12/08/2019   Chronic pain syndrome 12/08/2019   Lumbar radicular pain (left S1) 12/08/2019   Thoracic radiculitis 09/29/2019   Mid back pain 02/11/2019   Multiple joint pain 11/12/2018   UTI (urinary tract infection) 04/08/2018   Eating disorder 05/23/2017   Gallstones 04/19/2017   Diabetic polyneuropathy associated with type 2 diabetes mellitus ([place]) 04/07/2017   Diabetic angiopathy ([place]) 04/07/2017   Vasomotor symptoms due to menopause 04/07/2017   Mobility impaired 11/12/2016   Urinary incontinence 12/02/2015   Candidal intertrigo 04/06/2015   Cystocele 04/06/2015  Constipation 04/06/2015   Myofascial pain syndrome of lumbar spine 12/28/2014   Hypertriglyceridemia 10/14/2014   Breast mass in female 07/28/2014   Right knee pain 07/23/2013   Fall 07/23/2013   Medication management 11/20/2012   Uric acid kidney stone 11/20/2011   HYPERHIDROSIS 11/07/2009   Menopausal symptoms 06/17/2009   Type 2 diabetes mellitus without complication, without long-term current use of insulin ([place]) 08/23/2008   Vitamin D deficiency 05/07/2008   Vitamin B 12 deficiency 06/04/2007   CHRONIC FATIGUE SYNDROME 11/15/2006   DIVERTICULITIS, HX OF 11/15/2006   PCOS (polycystic ovarian syndrome) 10/16/2006   Hyperlipidemia associated with type 2 diabetes mellitus ([place]) 10/16/2006   Morbid obesity ([place]) 10/16/2006   Generalized anxiety disorder 10/16/2006   PANIC DISORDER 10/16/2006    BULIMIA 10/16/2006   Chronic depression 10/16/2006   CARPAL TUNNEL SYNDROME 10/16/2006   LYMPHEDEMA 10/16/2006   Allergic rhinitis 10/16/2006   Mild intermittent asthma 10/16/2006   TMJ SYNDROME 10/16/2006   GERD 10/16/2006   IRRITABLE BOWEL SYNDROME 10/16/2006   Osteoarthritis 10/16/2006   COUGH, CHRONIC 10/16/2006   Past Medical History:  Diagnosis Date   Allergy    allergic rhinitis   Anemia    Anxiety    Arthritis    osteoarthritis   Asthma    Claustrophobia    does not like oxygen mask covering face   Depression    Diabetes mellitus without complication ([place])    Fall 2016   Fatty liver 07/2008   abd. ultrasound - fatty liver ; slt dilated cbd (no stones ) 06/10// abd.  ultrasound normal on 04/2006   Fibromyalgia    GERD (gastroesophageal reflux disease)    EGD negative 06/2001// EGD erythematous mucosa, polyp 08/2008   High uric acid in 24 hour urine specimen    Hyperhidrosis    Hyperlipidemia    Kidney stones    Lymphedema    Obesity    PCOS (polycystic ovarian syndrome)    Shortness of breath dyspnea    Tachycardia    TMJ (dislocation of temporomandibular joint)    UTI (lower urinary tract infection)    Past Surgical History:  Procedure Laterality Date   BREAST BIOPSY Right 2016   neg   BREAST LUMPECTOMY WITH RADIOACTIVE SEED LOCALIZATION Right 08/02/2014   Procedure: BREAST LUMPECTOMY WITH RADIOACTIVE SEED LOCALIZATION;  Surgeon: Rolm Bookbinder, MD;  Location: [facility];  Service: General;  Laterality: Right;   COLONOSCOPY  08/12/2012   Completed by Dr. Phylis Bougie, normal exam.    ENDOMETRIAL BIOPSY  01/2004   ESOPHAGOGASTRODUODENOSCOPY     ESOPHAGOGASTRODUODENOSCOPY (EGD) WITH PROPOFOL N/A 08/18/2019   Procedure: ESOPHAGOGASTRODUODENOSCOPY (EGD) WITH PROPOFOL;  Surgeon: Lucilla Lame, MD;  Location: [facility] ENDOSCOPY;  Service: Endoscopy;  Laterality: N/A;   FOOT SURGERY     SEPTOPLASTY     two times   TONSILLECTOMY     UTERINE FIBROID SURGERY  06/2005    ablation   uterine tumor  09/2001   VAGUS NERVE STIMULATOR INSERTION     Social History   Tobacco Use   Smoking status: Never   Smokeless tobacco: Never  Vaping Use   Vaping Use: Never used  Substance Use Topics   Alcohol use: No    Alcohol/week: 0.0 standard drinks of alcohol   Drug use: No   Family History  Problem Relation Age of Onset   Hypertension Father    Cancer Father        pancreatic cancer   Hypertension Sister    Heart disease  Maternal Grandmother 9       MI   Diabetes Maternal Grandmother    Heart disease Paternal Grandmother 21       MI   Diabetes Paternal Grandmother    Breast cancer Neg Hx    Allergies  Allergen Reactions   Cephalexin    Codeine     rash   Duloxetine    Levaquin [Levofloxacin] Nausea Only   Lisinopril Cough   Metformin And Related Diarrhea    GI side effects    Quetiapine Other (See Comments)   Sertraline Hcl     REACTION: vivid dreams   Triazolam     REACTION: ? reaction   Zaleplon    Zocor [Simvastatin - High Dose]     achey   Zolpidem Tartrate     hallucinations   Zyrtec [Cetirizine]     Sedation    Hydrocodone Itching and Rash    REACTION: redness rash itching   Norelgestromin-Eth Estradiol     Patch didn't stick to skin   Current Outpatient Medications on File Prior to Visit  Medication Sig Dispense Refill   acetaminophen (TYLENOL) 500 MG tablet Take 500 mg by mouth every 6 (six) hours as needed.      albuterol (VENTOLIN HFA) 108 (90 Base) MCG/ACT inhaler INHALE 2 PUFFS INTO THE LUNGS EVERY 6 HOURS AS NEEDED FOR WHEEZING. 18 g 3   ALPRAZolam (XANAX) 1 MG tablet Take 2 mg by mouth 3 (three) times daily as needed.     ammonium lactate (AMLACTIN) 12 % cream Apply 1 application. topically in the morning and at bedtime. For dry skin 385 g 2   Blood Glucose Monitoring Suppl (FIFTY50 GLUCOSE METER 2.0) w/Device KIT Use as directed     buPROPion (WELLBUTRIN XL) 150 MG 24 hr tablet Take 150 mg by mouth daily.      Cholecalciferol (VITAMIN D3 PO) Take 2,000 Units by mouth daily.     clindamycin (CLEOCIN-T) 1 % lotion Apply topically daily. Qd to bumps on face and chest 60 mL 6   cyclobenzaprine (FLEXERIL) 5 MG tablet Take 1 tablet (5 mg total) by mouth 3 (three) times daily as needed for muscle spasms. 90 tablet 2   desvenlafaxine (PRISTIQ) 100 MG 24 hr tablet Take 100 mg by mouth daily.     dimenhyDRINATE (DRAMAMINE) 50 MG tablet Take 50 mg by mouth every 8 (eight) hours as needed.     Docusate Calcium (STOOL SOFTENER PO) Take 1-2 tablets by mouth daily as needed.     fluticasone (FLONASE) 50 MCG/ACT nasal spray Place into both nostrils daily.     gabapentin (NEURONTIN) 100 MG capsule TAKE 2 CAPSULES BY MOUTH EVERY MORNING AND 3 CAPSULES EVERY EVENING 150 capsule 0   gabapentin (NEURONTIN) 300 MG capsule TAKE 1 CAPSULE AT BEDTIME 90 capsule 1   ibuprofen (ADVIL) 600 MG tablet Take 1 tablet (600 mg total) by mouth every 8 (eight) hours as needed. 30 tablet 0   Insulin Degludec (TRESIBA ) Inject into the skin. 1 injection nightly 8-10 pm     ketoconazole (NIZORAL) 2 % cream Apply 1 application topically daily. 60 g 2   losartan (COZAAR) 25 MG tablet      minoxidil (LONITEN) 2.5 MG tablet Take 0.5 tablets (1.25 mg total) by mouth daily. 90 tablet 1   minoxidil (LONITEN) 2.5 MG tablet Take by mouth.     nitrofurantoin, macrocrystal-monohydrate, (MACROBID) 100 MG capsule Take 1 capsule (100 mg total) by mouth 2 (two)  times daily. 10 capsule 0   nystatin cream (MYCOSTATIN) APPLY TO AFFECTED AREAS 3 TIMES DAILY ASNEEDED 30 g 3   omeprazole (PRILOSEC) 20 MG capsule TAKE 1 CAPSULE BY MOUTH 2 TIMES DAILY. 180 capsule 3   OVER THE COUNTER MEDICATION Take 1 capsule by mouth daily. BARIATRIC MULTIVITAMIN IRON FREE     oxybutynin (DITROPAN XL) 15 MG 24 hr tablet TAKE 1 TABLET BY MOUTH DAILY 90 tablet 1   Polyethyl Glycol-Propyl Glycol (SYSTANE OP) Apply 1 drop to eye daily as needed.     pravastatin (PRAVACHOL) 20 MG  tablet 1 TABLET BY MOUTH AT BEDTIME. 90 tablet 3   psyllium (METAMUCIL) 58.6 % powder Take 1 packet by mouth 2 (two) times daily.     Simethicone (GAS-X PO) Take by mouth as needed.     spironolactone (ALDACTONE) 100 MG tablet TAKE 1 TABLET BY MOUTH DAILY 90 tablet 1   tirzepatide (MOUNJARO) 7.5 MG/0.5ML Pen Inject 12.5 mg into the skin once a week.     traMADol (ULTRAM) 50 MG tablet TAKE ONE TABLET TWICE A DAY IF NEEDED FOR SEVERE PAIN 30 tablet 0   traZODone (DESYREL) 150 MG tablet Take 300 mg by mouth at bedtime.      zinc oxide (BALMEX) 11.3 % CREA cream Apply 1 application topically 2 (two) times daily.     No current facility-administered medications on file prior to visit.      Review of Systems  Constitutional:  Positive for fatigue. Negative for activity change, appetite change, fever and unexpected weight change.  HENT:  Positive for postnasal drip. Negative for congestion, ear pain, rhinorrhea, sinus pressure and sore throat.   Eyes:  Negative for pain, redness and visual disturbance.  Respiratory:  Negative for cough, shortness of breath and wheezing.   Cardiovascular:  Negative for chest pain and palpitations.  Gastrointestinal:  Negative for abdominal pain, blood in stool, constipation and diarrhea.  Endocrine: Negative for polydipsia and polyuria.  Genitourinary:  Negative for dysuria, frequency and urgency.  Musculoskeletal:  Positive for back pain and myalgias. Negative for arthralgias.  Skin:  Negative for pallor and rash.       Nail problem  Allergic/Immunologic: Negative for environmental allergies.  Neurological:  Positive for numbness. Negative for dizziness, syncope, light-headedness and headaches.  Hematological:  Negative for adenopathy. Does not bruise/bleed easily.  Psychiatric/Behavioral:  Negative for decreased concentration and dysphoric mood. The patient is not nervous/anxious.        Objective:   Physical Exam Constitutional:      General: She is not  in acute distress.    Appearance: Normal appearance. She is well-developed. She is obese. She is not ill-appearing or diaphoretic.  HENT:     Head: Normocephalic and atraumatic.  Eyes:     General: No scleral icterus.       Right eye: No discharge.        Left eye: No discharge.     Conjunctiva/sclera: Conjunctivae normal.     Pupils: Pupils are equal, round, and reactive to light.  Neck:     Thyroid: No thyromegaly.     Vascular: No carotid bruit or JVD.  Cardiovascular:     Rate and Rhythm: Normal rate and regular rhythm.     Heart sounds: Normal heart sounds.     No gallop.  Pulmonary:     Effort: Pulmonary effort is normal. No respiratory distress.     Breath sounds: Normal breath sounds. No wheezing or rales.  Abdominal:  General: There is no distension or abdominal bruit.     Palpations: Abdomen is soft.     Comments: No suprapubic tenderness or fullness  No cva tenderness   Musculoskeletal:     Cervical back: Normal range of motion and neck supple.     Right lower leg: No edema.     Left lower leg: No edema.  Lymphadenopathy:     Cervical: No cervical adenopathy.  Skin:    General: Skin is warm and dry.     Coloration: Skin is not pale.     Findings: No rash.     Comments: Medial side of L great toe nail is ingrown No erythema or drainage  Tender at nail border   Neurological:     Mental Status: She is alert.     Cranial Nerves: No cranial nerve deficit.     Motor: No weakness.     Coordination: Coordination normal.     Gait: Gait normal.     Deep Tendon Reflexes: Reflexes are normal and symmetric. Reflexes normal.     Comments: Positive tinel sign moreso on r wrist than left   No focal neuro signs  Psychiatric:        Mood and Affect: Mood is depressed.           Assessment & Plan:   Problem List Items Addressed This Visit       Musculoskeletal and Integument   Ingrown nail of great toe of left foot    Medial side No signs of infection   Recommend good shoes and f/u with podiatry (who does her nail care)         Genitourinary   Cystitis with hematuria   Relevant Orders   POCT Urinalysis Dipstick (Automated) (Completed)   UTI (urinary tract infection) - Primary    Recently UC- px macrobid and symptoms are improved (e coli sensitive to macrobid) ua is not clear but still finishing abx  Possible contamination (difficult for her to wipe with back problems)  Encouraged good fluid intake  Culture pending  If positive bactrim is opt if holding the spironolactone       Relevant Orders   POCT Urinalysis Dipstick (Automated) (Completed)   Urine Culture     Other   Paresthesia    Primarily R  Hand (now improved) Past h/o carpal tunnel(pos tinel sign today) Strength it normal  Has some mattress/sleep position issues   Will observe  If this continues may need wrist splint Pt plans f/u with chiropractor for back/ etc

## 2021-11-15 NOTE — Assessment & Plan Note (Signed)
Primarily R  Hand (now improved) Past h/o carpal tunnel(pos tinel sign today) Strength it normal  Has some mattress/sleep position issues   Will observe  If this continues may need wrist splint Pt plans f/u with chiropractor for back/ etc

## 2021-11-15 NOTE — Assessment & Plan Note (Signed)
Medial side No signs of infection  Recommend good shoes and f/u with podiatry (who does her nail care)
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
## 2021-11-15 NOTE — Assessment & Plan Note (Signed)
Recently UC- px macrobid and symptoms are improved (e coli sensitive to macrobid) ua is not clear but still finishing abx  Possible contamination (difficult for her to wipe with back problems)  Encouraged good fluid intake  Culture pending  If positive bactrim is opt if holding the spironolactone

## 2021-11-15 NOTE — Patient Instructions (Addendum)
Keep drinking fluids   Let us know if the tingling does not improve  Keep up with the chiropractor   I sent a urine culture and will alert you when it returns  Finish the last dose of antibiotics   If vomiting returns or other symptoms let us know

## 2021-11-16 NOTE — Telephone Encounter (Signed)
Please advise 

## 2021-11-19 ENCOUNTER — Encounter: Payer: Self-pay | Admitting: Family Medicine

## 2021-11-19 LAB — URINE CULTURE
MICRO NUMBER:: 14007081
SPECIMEN QUALITY:: ADEQUATE

## 2021-11-21 ENCOUNTER — Ambulatory Visit: Payer: Medicare PPO | Admitting: Podiatry

## 2021-11-21 DIAGNOSIS — M79674 Pain in right toe(s): Secondary | ICD-10-CM | POA: Diagnosis not present

## 2021-11-21 DIAGNOSIS — B351 Tinea unguium: Secondary | ICD-10-CM | POA: Diagnosis not present

## 2021-11-21 DIAGNOSIS — L600 Ingrowing nail: Secondary | ICD-10-CM | POA: Diagnosis not present

## 2021-11-21 DIAGNOSIS — M79675 Pain in left toe(s): Secondary | ICD-10-CM

## 2021-11-21 MED ORDER — SULFAMETHOXAZOLE-TRIMETHOPRIM 800-160 MG PO TABS: 1.0000 | ORAL_TABLET | Freq: Two times a day (BID) | ORAL | 0 refills | Status: DC

## 2021-11-21 NOTE — Progress Notes (Addendum)
Chief Complaint  Patient presents with   foot care    Patient is here for diabetic foot care.    SUBJECTIVE Patient with a history of diabetes mellitus presents to office today complaining of elongated, thickened nails that cause pain while ambulating in shoes.  Patient is unable to trim their own nails.  Patient also has been complaining of chronic ingrown toenail to the medial border of the left great toe.  She says it is very sensitive and she would like to have it corrected for today.  She has tried trimming the nail out but it continues to cause pain and tenderness especially in shoes.  Patient is here for further evaluation and treatment.   Past Medical History:  Diagnosis Date   Allergy    allergic rhinitis   Anemia    Anxiety    Arthritis    osteoarthritis   Asthma    Claustrophobia    does not like oxygen mask covering face   Depression    Diabetes mellitus without complication ([place])    Fall 2016   Fatty liver 07/2008   abd. ultrasound - fatty liver ; slt dilated cbd (no stones ) 06/10// abd.  ultrasound normal on 04/2006   Fibromyalgia    GERD (gastroesophageal reflux disease)    EGD negative 06/2001// EGD erythematous mucosa, polyp 08/2008   High uric acid in 24 hour urine specimen    Hyperhidrosis    Hyperlipidemia    Kidney stones    Lymphedema    Obesity    PCOS (polycystic ovarian syndrome)    Shortness of breath dyspnea    Tachycardia    TMJ (dislocation of temporomandibular joint)    UTI (lower urinary tract infection)     OBJECTIVE General Patient is awake, alert, and oriented x 3 and in no acute distress. Derm Skin is dry and supple bilateral. Negative open lesions or macerations. Remaining integument unremarkable. Nails are tender, long, thickened and dystrophic with subungual debris, consistent with onychomycosis, 1-5 bilateral. No signs of infection noted.  Medial border of the left great toe appears irritated and growing into the left medial  nail fold.  Associated tenderness with palpation of this area.  Findings consistent with ingrowing toenail/paronychia Vasc  DP and PT pedal pulses palpable bilaterally. Temperature gradient within normal limits.  Neuro Epicritic and protective threshold sensation diminished bilaterally.  Musculoskeletal Exam No symptomatic pedal deformities noted bilateral. Muscular strength within normal limits.  ASSESSMENT 1. Diabetes Mellitus w/ peripheral neuropathy 2.  Pain due to onychomycosis of toenails bilateral 3.  Ingrown toenail medial border left great toe  PLAN OF CARE 1. Patient evaluated today. 2. Instructed to maintain good pedal hygiene and foot care. Stressed importance of controlling blood sugar.  3. Mechanical debridement of nails 1-5 bilaterally performed using a nail nipper. Filed with dremel without incident.  4.  Today we discussed treatment options for the ingrown toenail.  Both conservative with debridement as well as nail matricectomy.  Patient states that simple debridement does not alleviate her symptoms.  She is requesting permanent nail matricectomy today.  Risk benefits advantages and disadvantages explained.  No guarantees expressed or implied.   5.  Partial permanent nail matricectomy was performed using 3 x 93-YBOFBP application of phenol and alcohol flush.  Prior to this procedure the area was prepped aseptically and digital block performed using 3 mL of 2% lidocaine plain.  Light dressings applied.  Post care instructions provided.   6.  Return to clinic  3 weeks for ingrown follow-up  Edrick Kins, DPM [facility]  Dr. Edrick Kins, DPM    [address], [place] [address]                Office [phone]  Fax [phone]

## 2021-11-23 ENCOUNTER — Other Ambulatory Visit: Payer: Self-pay | Admitting: Podiatry

## 2021-11-23 DIAGNOSIS — M778 Other enthesopathies, not elsewhere classified: Secondary | ICD-10-CM

## 2021-11-27 ENCOUNTER — Other Ambulatory Visit: Payer: Self-pay | Admitting: Family Medicine

## 2021-11-28 NOTE — Telephone Encounter (Signed)
Does she still see Dr Holley Raring (pain management) - I suspect not, just wanted to make sure

## 2021-11-28 NOTE — Telephone Encounter (Signed)
Last filled by Dr. Holley Raring on 08/04/20 #90 tabs w/ 2 refills

## 2021-11-29 NOTE — Telephone Encounter (Signed)
Spoke with pt and she said she no longer sees Dr. Holley Raring and would like PCP to take over med

## 2021-11-30 ENCOUNTER — Ambulatory Visit (INDEPENDENT_AMBULATORY_CARE_PROVIDER_SITE_OTHER): Payer: Medicare PPO | Admitting: Clinical

## 2021-11-30 ENCOUNTER — Telehealth: Payer: Self-pay | Admitting: *Deleted

## 2021-11-30 ENCOUNTER — Ambulatory Visit (INDEPENDENT_AMBULATORY_CARE_PROVIDER_SITE_OTHER): Payer: Medicare PPO

## 2021-11-30 DIAGNOSIS — F332 Major depressive disorder, recurrent severe without psychotic features: Secondary | ICD-10-CM | POA: Diagnosis not present

## 2021-11-30 DIAGNOSIS — Z23 Encounter for immunization: Secondary | ICD-10-CM | POA: Diagnosis not present

## 2021-11-30 DIAGNOSIS — F411 Generalized anxiety disorder: Secondary | ICD-10-CM

## 2021-11-30 NOTE — Telephone Encounter (Signed)
Patient is calling because her toes are painful trying to wear shoes since appointment on 10/10, bleeding.   Called patient for clarification since nothing mentioned in epic of an ingrown, no answer, could not leave a voice message.

## 2021-11-30 NOTE — Progress Notes (Signed)
[facility] Counselor/Therapist Progress Note  Patient ID: Mackenzie Bonilla, MRN: [id number]    Date: 11/30/21  Time Spent: 1:31  pm - 2:32 pm : 61 Minutes  Treatment Type: Individual Therapy.  Reported Symptoms: Patient reported she stays in her recliner until she is required to get up to complete a task, decreased hygiene, decrease in completion of daily household tasks  Mental Status Exam: Appearance:  Neat     Behavior: Appropriate  Motor: Normal  Speech/Language:  Clear and Coherent  Affect: Tearful  Mood: normal, Patient reported "fair" mood  Thought process: normal  Thought content:   WNL  Sensory/Perceptual disturbances:   WNL  Orientation: oriented to person, place, and situation  Attention: Good  Concentration: Good  Memory: WNL  Fund of knowledge:  Good  Insight:   Fair  Judgment:  Good  Impulse Control: Fair   Risk Assessment: Danger to Self:  No Patient denied current suicidal ideation  Self-injurious Behavior: No Danger to Others: No Patient denied current homicidal ideation Duty to Warn:no Physical Aggression / Violence:No  Access to Firearms a concern: No  Gang Involvement:No   Subjective:  Patient reported she would like to transition to a new psychiatrist due to psychiatrist moving frequently, psychiatrist's location, and reported provider is not covered by patient's insurance. Patient reported she has not discussed transitioning to a new psychiatrist with her current provider. Patient reported concern that a new provider will not know her history. Patient reported she plans to discuss her concerns with her psychiatrist. Patient stated, "its better than it has been" in response to her current mood. Patient reported "fair" mood today. Patient reported she stays in her recliner until she is required to get up for a reason, stated "I don't care what's going on outside, don't want anybody to bother me". Patient stated, "I think If I did die would  I be ok" and stated, "it goes through my mind and it goes out". Patient reported she has not been cleaning her home and will go several days without taking a shower. Patient stated, "its very hard for me to make friends, I lose them, they just stop coming". Patient reported she would like to address issues in her apartment, such as, routine cleaning, decrease eating in response to emotions, increase in participation in activities out of the house, and weight loss as potential goals for therapy.    Interventions: Motivational Interviewing. Discussed patient's inquiry about changing psychiatrist and identified barriers to continuing treatment with current provider. Clinician recommended patient discuss changing psychiatrist with her current provider.  Clinician utilized motivational interviewing to explore potential goals for therapy.   Diagnosis:  Generalized anxiety disorder  Severe episode of recurrent major depressive disorder, without psychotic features ([place])   Plan: Goals to be finalized during follow up appointment on 12/06/21.                      Katherina Right, LCSW

## 2021-11-30 NOTE — Telephone Encounter (Signed)
Yes I just trimmed her nails and I didn't cut her. If she is concerned you can have her come in for a follow up but she should be fine. - Dr. Amalia Hailey

## 2021-12-01 NOTE — Telephone Encounter (Signed)
Patient said that when she came in on the 10th, an ingrown procedure was performed on the right great toe, (nothing mentioned in epics notes form 11/21/21)no instructions given for aftercare, now the nail is still bleeding.   I am emailing  her soaking post procedural instructions per her request.

## 2021-12-04 ENCOUNTER — Telehealth: Payer: Self-pay | Admitting: Podiatry

## 2021-12-04 NOTE — Telephone Encounter (Signed)
Sure that's fine. - Dr. Amalia Hailey

## 2021-12-04 NOTE — Telephone Encounter (Signed)
Please follow-up with her.  If she is still having some pain just have her come in for a follow-up appointment.  Thanks, Dr. Amalia Hailey

## 2021-12-04 NOTE — Telephone Encounter (Signed)
Patient called she doesn't have shoes that will fit over her toe and its too painful to try to squeeze her foot into her shoes, she wants to know if she can wear a surgical shoe on that foot ?

## 2021-12-05 ENCOUNTER — Telehealth: Payer: Self-pay | Admitting: Podiatry

## 2021-12-05 DIAGNOSIS — B351 Tinea unguium: Secondary | ICD-10-CM | POA: Diagnosis not present

## 2021-12-05 NOTE — Telephone Encounter (Signed)
Please contact patient to schedule appointment if still having pain, tried to call, both numbers, could not leave voice message, voicemail not set up.

## 2021-12-05 NOTE — Telephone Encounter (Signed)
Please advise 

## 2021-12-05 NOTE — Telephone Encounter (Signed)
Pt called back and is aware ok to wear a surgical shoe as she requested but pt does not have one and would like to come by the [place] office to pick one up today.

## 2021-12-05 NOTE — Telephone Encounter (Signed)
That is fine.  She can come into the office and pick it up.-Dr. Amalia Hailey

## 2021-12-06 ENCOUNTER — Ambulatory Visit (INDEPENDENT_AMBULATORY_CARE_PROVIDER_SITE_OTHER): Payer: Medicare PPO | Admitting: Clinical

## 2021-12-06 ENCOUNTER — Encounter: Payer: Self-pay | Admitting: Podiatry

## 2021-12-06 DIAGNOSIS — F411 Generalized anxiety disorder: Secondary | ICD-10-CM | POA: Diagnosis not present

## 2021-12-06 DIAGNOSIS — F332 Major depressive disorder, recurrent severe without psychotic features: Secondary | ICD-10-CM | POA: Diagnosis not present

## 2021-12-06 NOTE — Progress Notes (Signed)
[facility] Counselor/Therapist Progress Note  Patient ID: Mackenzie Bonilla, MRN: [id number]    Date: 12/06/21  Time Spent: 12:31  pm - 1:38 pm : 67 Minutes  Treatment Type: Individual Therapy.  Reported Symptoms: Patient reported feeling frustrated and helpless.  Mental Status Exam: Appearance:  Neat     Behavior: Appropriate  Motor: Mannerisms  Speech/Language:  Clear and Coherent  Affect: Tearful  Mood: depressed  Thought process: normal  Thought content:   WNL  Sensory/Perceptual disturbances:   WNL  Orientation: oriented to person, place, and situation  Attention: Good  Concentration: Good  Memory: WNL  Fund of knowledge:  Good  Insight:   Fair  Judgment:  Good  Impulse Control: Fair   Risk Assessment: Danger to Self:  No Patient denied current suicidal ideation Self-injurious Behavior: No Danger to Others: No Patient denied current homicidal ideation Duty to Warn:no Physical Aggression / Violence:No  Access to Firearms a concern: No  Gang Involvement:No   Subjective:  Patient reported upcoming appointment with psychiatrist on Monday and reported feeling anxious about conversation with psychiatrist in regards to changing providers. Patient reported she has been practicing upcoming conversation with psychiatrist to discuss barriers to maintaining appointments. Patient reported she has considered staying with current psychiatrist since her appointments are twice a year. Patient reported concern that a new psychiatrist will have "preconceived notions" prior to the initial appointment and concerned a new provider will not be a therapeutic fit. Patient reported she did not pay her bills last month and reported difficulty balancing her checkbook. Patient reported some of her monthly bills are automatically drafted from her account which she finds helpful. Patient stated, "I have a real issue with abandonment". Patient reported participation in therapy with a  provider for 10 years and reported she was upset when the therapeutic relationship ended. Patient reported she tried to maintain contact with the previous provider and was upset by the providers response. Patient stated, "that's one thing I struggle with, is someone mad at me".   Interventions: Cognitive Behavioral Therapy and Motivational Interviewing. Discussed patient's thoughts and feelings related to upcoming conversation with psychiatrist and identified strategies to approach the conversation, such as, making a list of the pros/cons for changing providers, practiced conversation with psychiatrist to discuss barriers to attending appointments, writing barriers down in preparation for conversation, writing a letter to psychiatrist to discuss during visit. Discussed patient's previous experience in therapy and feelings of abandonment when services ended. Provided psycho education related to therapeutic boundaries and ethics. Provided psycho education related to depressive symptoms. Clinician utilized motivational interviewing to explore potential goals for therapy. Clinician utilized a task centered approach in collaboration with patient to develop goals for therapy.   Diagnosis:  Generalized anxiety disorder  Severe episode of recurrent major depressive disorder, without psychotic features (H[facility])    Plan: Patient is to utilize [employer] Therapy, thought re-framing, dialectical behavior therapy skills, and coping strategies to decrease symptoms associated with Generalized Anxiety Disorder and Major Depressive Disorder. Frequency: bi-weekly  Modality: individual     Long-term goal:   Reduce overall level, frequency, and intensity of the symptoms of depression and anxiety as evidenced by decreased difficulty making decisions, feeling anxious, staying at home, nightmares, difficulty staying asleep, racing thoughts, worry about her finances, decreased concentration, restlessness, excessive  worry, lack of energy, and increased appetite from 6 to 7 days per week to 0 to 1 days per week per patient's report for at least 3 consecutive months.  Target Date: 12/07/22  Progress: progressing   Short-term goal:  Increase daily care of personal grooming and hygiene as evidenced by showering and getting dressed daily  Target Date: 06/07/22  Progress: progressing   Increase daily activities related to household chores, such as cleaning,  laundry, paying monthly bills Target Date: 06/07/22  Progress: progressing   Write at least one positive affirmation about herself daily  Target Date: 06/07/22  Progress: progressing   Learn and implement social skills to reduce depressive symptoms and build confidence in social interactions  Target Date: 06/07/22  Progress: progressing   Identify negative self talk used to reinforce low self esteem and replace with positive self talk  Target Date: 06/07/22  Progress: progressing   Increase participation in social opportunities from 0 days per week to 1 days per week  Target Date: 06/07/22  Progress: progressing   Decrease feelings of anger, depression, and anxiety by developing healthy coping mechanisms  Target Date: 06/07/22  Progress: progressing                       Doree Barthel, LCSW

## 2021-12-07 DIAGNOSIS — E119 Type 2 diabetes mellitus without complications: Secondary | ICD-10-CM | POA: Diagnosis not present

## 2021-12-08 ENCOUNTER — Ambulatory Visit: Payer: Medicare PPO | Admitting: Podiatry

## 2021-12-11 ENCOUNTER — Ambulatory Visit: Payer: Medicare PPO | Admitting: Podiatry

## 2021-12-11 DIAGNOSIS — M9903 Segmental and somatic dysfunction of lumbar region: Secondary | ICD-10-CM | POA: Diagnosis not present

## 2021-12-11 DIAGNOSIS — M9904 Segmental and somatic dysfunction of sacral region: Secondary | ICD-10-CM | POA: Diagnosis not present

## 2021-12-11 DIAGNOSIS — L600 Ingrowing nail: Secondary | ICD-10-CM

## 2021-12-11 DIAGNOSIS — M9905 Segmental and somatic dysfunction of pelvic region: Secondary | ICD-10-CM | POA: Diagnosis not present

## 2021-12-11 DIAGNOSIS — M5431 Sciatica, right side: Secondary | ICD-10-CM | POA: Diagnosis not present

## 2021-12-12 ENCOUNTER — Ambulatory Visit: Payer: Medicare PPO | Admitting: Podiatry

## 2021-12-12 NOTE — Progress Notes (Signed)
  Subjective:  Patient ID: Mackenzie Bonilla, female    DOB: 09/13/1965,  MRN: [id number]  Chief Complaint  Patient presents with   Ingrown Toenail    Follow-up on toenail procedure    56 y.o. female presents with the above complaint. History confirmed with patient.  She had ingrown toenail removed on the left medial border a few weeks ago and wanted to make sure it is healing up okay  Objective:  Physical Exam: warm, good capillary refill, no trophic changes or ulcerative lesions, normal DP and PT pulses, normal sensory exam, and left hallux medial nail matricectomy site is healing well there are no signs of infection.  Some erythema adjacent to matricectomy site directly but does not extend or show evidence of cellulitis  Assessment:   1. Ingrowing left great toenail      Plan:  Patient was evaluated and treated and all questions answered.  Overall appears to be healing well I think she can leave this open to air and discontinue soaks and ointments at this point.  Advised the erythema is likely secondary reaction to phenolic acid and this may take another few weeks to resolve completely.  She will return to see Dr. Amalia Hailey as scheduled.  No follow-ups on file.

## 2021-12-13 ENCOUNTER — Ambulatory Visit: Payer: Medicare PPO | Admitting: Clinical

## 2021-12-15 ENCOUNTER — Ambulatory Visit: Payer: Medicare PPO | Admitting: Podiatry

## 2021-12-21 ENCOUNTER — Ambulatory Visit: Payer: Medicare PPO | Admitting: Family

## 2021-12-21 ENCOUNTER — Encounter: Payer: Self-pay | Admitting: Family

## 2021-12-21 VITALS — BP 100/74 | HR 76 | Temp 98.0°F | Resp 16 | Ht 64.0 in | Wt 280.0 lb

## 2021-12-21 DIAGNOSIS — R350 Frequency of micturition: Secondary | ICD-10-CM

## 2021-12-21 DIAGNOSIS — N390 Urinary tract infection, site not specified: Secondary | ICD-10-CM

## 2021-12-21 DIAGNOSIS — R82998 Other abnormal findings in urine: Secondary | ICD-10-CM | POA: Diagnosis not present

## 2021-12-21 LAB — POC URINALSYSI DIPSTICK (AUTOMATED)
Bilirubin, UA: NEGATIVE
Blood, UA: NEGATIVE
Glucose, UA: NEGATIVE
Ketones, UA: NEGATIVE
Nitrite, UA: NEGATIVE
Protein, UA: POSITIVE — AB
Spec Grav, UA: 1.015 (ref 1.010–1.025)
Urobilinogen, UA: 0.2 E.U./dL
pH, UA: 6 (ref 5.0–8.0)

## 2021-12-21 NOTE — Progress Notes (Signed)
Established Patient Office Visit  Subjective:  Patient ID: Mackenzie Bonilla, female    DOB: 1965-08-23  Age: 56 y.o. MRN: [id number]  CC:  Chief Complaint  Patient presents with   Urinary Tract Infection    HPI Mackenzie Bonilla is here today with concerns.   States stlil with cloudly urine.  She has recurrent UTI's was referred to urology however has not made appt yet.  Urine culture positive for UTI end of October, treated with bactrim.   She does also c/o a bump that is tender around her anus. She is worried because she had seen [place] before for this similar and it was an abscess requiring I&D with a Psychologist, sport and exercise. She states it is hard for er to wipe at times and sometimes fecal matter will sit on the skin and irritate her. Se does try to use wipes and use her shower handle to get in the back but it is hard at times.    Past Medical History:  Diagnosis Date   Allergy    allergic rhinitis   Anemia    Anxiety    Arthritis    osteoarthritis   Asthma    Claustrophobia    does not like oxygen mask covering face   Depression    Diabetes mellitus without complication ([place])    Fall 2016   Fatty liver 07/2008   abd. ultrasound - fatty liver ; slt dilated cbd (no stones ) 06/10// abd.  ultrasound normal on 04/2006   Fibromyalgia    GERD (gastroesophageal reflux disease)    EGD negative 06/2001// EGD erythematous mucosa, polyp 08/2008   High uric acid in 24 hour urine specimen    Hyperhidrosis    Hyperlipidemia    Kidney stones    Lymphedema    Obesity    PCOS (polycystic ovarian syndrome)    Shortness of breath dyspnea    Tachycardia    TMJ (dislocation of temporomandibular joint)    UTI (lower urinary tract infection)     Past Surgical History:  Procedure Laterality Date   BREAST BIOPSY Right 2016   neg   BREAST LUMPECTOMY WITH RADIOACTIVE SEED LOCALIZATION Right 08/02/2014   Procedure: BREAST LUMPECTOMY WITH RADIOACTIVE SEED LOCALIZATION;  Surgeon: Rolm Bookbinder,  MD;  Location: [facility];  Service: General;  Laterality: Right;   COLONOSCOPY  08/12/2012   Completed by Dr. Phylis Bougie, normal exam.    ENDOMETRIAL BIOPSY  01/2004   ESOPHAGOGASTRODUODENOSCOPY     ESOPHAGOGASTRODUODENOSCOPY (EGD) WITH PROPOFOL N/A 08/18/2019   Procedure: ESOPHAGOGASTRODUODENOSCOPY (EGD) WITH PROPOFOL;  Surgeon: Lucilla Lame, MD;  Location: [facility] ENDOSCOPY;  Service: Endoscopy;  Laterality: N/A;   FOOT SURGERY     SEPTOPLASTY     two times   TONSILLECTOMY     UTERINE FIBROID SURGERY  06/2005   ablation   uterine tumor  09/2001   VAGUS NERVE STIMULATOR INSERTION      Family History  Problem Relation Age of Onset   Hypertension Father    Cancer Father        pancreatic cancer   Hypertension Sister    Heart disease Maternal Grandmother 78       MI   Diabetes Maternal Grandmother    Heart disease Paternal Grandmother 68       MI   Diabetes Paternal Grandmother    Breast cancer Neg Hx     Social History   Socioeconomic History   Marital status: Single    Spouse name: Not on file  Number of children: Not on file   Years of education: Not on file   Highest education level: Not on file  Occupational History   Not on file  Tobacco Use   Smoking status: Never   Smokeless tobacco: Never  Vaping Use   Vaping Use: Never used  Substance and Sexual Activity   Alcohol use: No    Alcohol/week: 0.0 standard drinks of alcohol   Drug use: No   Sexual activity: Not Currently    Birth control/protection: None, Post-menopausal  Other Topics Concern   Not on file  Social History Narrative   Not on file   Social Determinants of Health   Financial Resource Strain: Low Risk  (08/25/2021)   Overall Financial Resource Strain (CARDIA)    Difficulty of Paying Living Expenses: Not hard at all  Food Insecurity: No Food Insecurity (08/25/2021)   Hunger Vital Sign    Worried About Running Out of Food in the Last Year: Never true    Ran Out of Food in the Last Year: Never true   Transportation Needs: No Transportation Needs (08/25/2021)   PRAPARE - Hydrologist (Medical): No    Lack of Transportation (Non-Medical): No  Physical Activity: Inactive (08/25/2021)   Exercise Vital Sign    Days of Exercise per Week: 0 days    Minutes of Exercise per Session: 0 min  Stress: Not on file  Social Connections: Not on file  Intimate Partner Violence: Not on file    Outpatient Medications Prior to Visit  Medication Sig Dispense Refill   acetaminophen (TYLENOL) 500 MG tablet Take 500 mg by mouth every 6 (six) hours as needed.      albuterol (VENTOLIN HFA) 108 (90 Base) MCG/ACT inhaler INHALE 2 PUFFS INTO THE LUNGS EVERY 6 HOURS AS NEEDED FOR WHEEZING. 18 g 3   ALPRAZolam (XANAX) 1 MG tablet Take 2 mg by mouth 3 (three) times daily as needed.     ammonium lactate (AMLACTIN) 12 % cream Apply 1 application. topically in the morning and at bedtime. For dry skin 385 g 2   Blood Glucose Monitoring Suppl (FIFTY50 GLUCOSE METER 2.0) w/Device KIT Use as directed     buPROPion (WELLBUTRIN XL) 150 MG 24 hr tablet Take 150 mg by mouth daily.     Cholecalciferol (VITAMIN D3 PO) Take 2,000 Units by mouth daily.     clindamycin (CLEOCIN-T) 1 % lotion Apply topically daily. Qd to bumps on face and chest 60 mL 6   cyclobenzaprine (FLEXERIL) 5 MG tablet TAKE 1 TABLET BY MOUTH 3 TIMES DAILY AS NEEDED FOR MUSCLE SPASMS 90 tablet 3   desvenlafaxine (PRISTIQ) 100 MG 24 hr tablet Take 100 mg by mouth daily.     dimenhyDRINATE (DRAMAMINE) 50 MG tablet Take 50 mg by mouth every 8 (eight) hours as needed.     Docusate Calcium (STOOL SOFTENER PO) Take 1-2 tablets by mouth daily as needed.     fluticasone (FLONASE) 50 MCG/ACT nasal spray Place into both nostrils daily.     gabapentin (NEURONTIN) 100 MG capsule TAKE 2 CAPSULES BY MOUTH EVERY MORNING AND 3 CAPSULES EVERY EVENING 150 capsule 0   gabapentin (NEURONTIN) 300 MG capsule TAKE 1 CAPSULE AT BEDTIME 90 capsule 1    ibuprofen (ADVIL) 600 MG tablet Take 1 tablet (600 mg total) by mouth every 8 (eight) hours as needed. 30 tablet 0   Insulin Degludec (TRESIBA [place]) Inject into the skin. 1 injection nightly 8-10 pm  ketoconazole (NIZORAL) 2 % cream Apply 1 application topically daily. 60 g 2   losartan (COZAAR) 25 MG tablet      minoxidil (LONITEN) 2.5 MG tablet Take 0.5 tablets (1.25 mg total) by mouth daily. 90 tablet 1   minoxidil (LONITEN) 2.5 MG tablet Take by mouth.     nystatin cream (MYCOSTATIN) APPLY TO AFFECTED AREAS 3 TIMES DAILY ASNEEDED 30 g 3   omeprazole (PRILOSEC) 20 MG capsule TAKE 1 CAPSULE BY MOUTH 2 TIMES DAILY. 180 capsule 3   OVER THE COUNTER MEDICATION Take 1 capsule by mouth daily. BARIATRIC MULTIVITAMIN IRON FREE     oxybutynin (DITROPAN XL) 15 MG 24 hr tablet TAKE 1 TABLET BY MOUTH DAILY 90 tablet 1   Polyethyl Glycol-Propyl Glycol (SYSTANE OP) Apply 1 drop to eye daily as needed.     pravastatin (PRAVACHOL) 20 MG tablet 1 TABLET BY MOUTH AT BEDTIME. 90 tablet 3   psyllium (METAMUCIL) 58.6 % powder Take 1 packet by mouth 2 (two) times daily.     Simethicone (GAS-X PO) Take by mouth as needed.     spironolactone (ALDACTONE) 100 MG tablet TAKE 1 TABLET BY MOUTH DAILY 90 tablet 1   tirzepatide (MOUNJARO) 7.5 MG/0.5ML Pen Inject 12.5 mg into the skin once a week.     traMADol (ULTRAM) 50 MG tablet TAKE ONE TABLET TWICE A DAY IF NEEDED FOR SEVERE PAIN 30 tablet 0   traZODone (DESYREL) 150 MG tablet Take 300 mg by mouth at bedtime.      zinc oxide (BALMEX) 11.3 % CREA cream Apply 1 application topically 2 (two) times daily.     nitrofurantoin, macrocrystal-monohydrate, (MACROBID) 100 MG capsule Take 1 capsule (100 mg total) by mouth 2 (two) times daily. (Patient not taking: Reported on 12/21/2021) 10 capsule 0   sulfamethoxazole-trimethoprim (BACTRIM DS) 800-160 MG tablet Take 1 tablet by mouth 2 (two) times daily. (Patient not taking: Reported on 12/21/2021) 10 tablet 0   No  facility-administered medications prior to visit.    Allergies  Allergen Reactions   Cephalexin    Codeine     rash   Duloxetine    Levaquin [Levofloxacin] Nausea Only   Lisinopril Cough   Metformin And Related Diarrhea    GI side effects    Quetiapine Other (See Comments)   Sertraline Hcl     REACTION: vivid dreams   Triazolam     REACTION: ? reaction   Zaleplon    Zocor [Simvastatin - High Dose]     achey   Zolpidem Tartrate     hallucinations   Zyrtec [Cetirizine]     Sedation    Hydrocodone Itching and Rash    REACTION: redness rash itching   Norelgestromin-Eth Estradiol     Patch didn't stick to skin        Objective:    Physical Exam Constitutional:      General: She is not in acute distress.    Appearance: Normal appearance. She is well-developed and well-groomed. She is obese. She is not ill-appearing, toxic-appearing or diaphoretic.  HENT:     Mouth/Throat:     Pharynx: No pharyngeal swelling.     Tonsils: No tonsillar exudate.  Neck:     Thyroid: No thyroid mass.  Pulmonary:     Effort: Pulmonary effort is normal.  Abdominal:     Tenderness: There is no abdominal tenderness.  Genitourinary:    Rectum: Normal. No tenderness or external hemorrhoid.     Comments: No abscess or  mass or redness or induration visualized around site of tenderness Musculoskeletal:     Lumbar back: Normal. No tenderness.     Comments: No flank pain no CVA tenderness  Lymphadenopathy:     Cervical:     Right cervical: No superficial cervical adenopathy.    Left cervical: No superficial cervical adenopathy.  Neurological:     General: No focal deficit present.     Mental Status: She is alert and oriented to person, place, and time. Mental status is at baseline.  Psychiatric:        Mood and Affect: Mood normal.        Behavior: Behavior normal.        Thought Content: Thought content normal.        Judgment: Judgment normal.     BP 100/74   Pulse 76   Temp 98 F  (36.7 C)   Resp 16   Ht _0  (1.626 m)   Wt 280 lb (127 kg)   SpO2 97%   BMI 48.06 kg/m  Wt Readings from Last 3 Encounters:  12/21/21 280 lb (127 kg)  11/15/21 280 lb 4 oz (127.1 kg)  10/10/21 283 lb 2 oz (128.4 kg)     Health Maintenance Due  Topic Date Due   HIV Screening  Never done   Hepatitis C Screening  Never done   PAP SMEAR-Modifier  03/04/2016   HEMOGLOBIN A1C  10/18/2017   COVID-19 Vaccine (4 - Pfizer risk series) 02/09/2020   FOOT EXAM  06/15/2021    There are no preventive care reminders to display for this patient.  Lab Results  Component Value Date   TSH 1.33 06/19/2021   Lab Results  Component Value Date   WBC 8.9 08/25/2021   HGB 13.9 08/25/2021   HCT 42.0 08/25/2021   MCV 96.4 08/25/2021   PLT 231.0 08/25/2021   Lab Results  Component Value Date   NA 137 08/25/2021   K 4.0 08/25/2021   CO2 29 08/25/2021   GLUCOSE 100 (H) 08/25/2021   BUN 17 08/25/2021   CREATININE 1.04 08/25/2021   BILITOT 0.3 08/25/2021   ALKPHOS 72 08/25/2021   AST 17 08/25/2021   ALT 15 08/25/2021   PROT 7.3 08/25/2021   ALBUMIN 4.4 08/25/2021   CALCIUM 10.0 08/25/2021   ANIONGAP 13 06/23/2015   GFR 60.26 08/25/2021   Lab Results  Component Value Date   HGBA1C 7.0 04/17/2017      Assessment & Plan:   Problem List Items Addressed This Visit       Genitourinary   Recurrent UTI    D/w pt and advised at this time, has had six UTI's in the last six months, referral again placed for urology. Pt to make appt.       Relevant Orders   Ambulatory referral to Urology     Other   Urinary frequency    poct urine dip in office Urine culture ordered pending results        White blood cells present on urine microscopy    Ordering urine culture, pending results to treat as possible resistance to prior UTI.       Relevant Orders   Urine Culture (Completed)   RESOLVED: Frequent urination - Primary   Relevant Orders   POCT Urinalysis Dipstick (Automated)  (Completed)    No orders of the defined types were placed in this encounter.   Follow-up: Return in about 1 week (around 12/28/2021) for make f/u appointment  with pcp as pt with mutliple concerns .    Eugenia Pancoast, FNP

## 2021-12-25 ENCOUNTER — Telehealth: Payer: Self-pay | Admitting: Family Medicine

## 2021-12-25 LAB — URINE CULTURE
MICRO NUMBER:: 14167530
SPECIMEN QUALITY:: ADEQUATE

## 2021-12-25 NOTE — Progress Notes (Signed)
Urine culture pending. .

## 2021-12-25 NOTE — Telephone Encounter (Signed)
Pt called asking if lab results were back yet for her UTI? Pt is requesting meds to be prescribed. Call back # [phone]

## 2021-12-26 ENCOUNTER — Other Ambulatory Visit: Payer: Self-pay | Admitting: Family

## 2021-12-26 DIAGNOSIS — N3000 Acute cystitis without hematuria: Secondary | ICD-10-CM

## 2021-12-26 MED ORDER — SULFAMETHOXAZOLE-TRIMETHOPRIM 800-160 MG PO TABS: 1.0000 | ORAL_TABLET | Freq: Two times a day (BID) | ORAL | 0 refills | Status: AC

## 2021-12-26 NOTE — Progress Notes (Signed)
Bactrim sent to pharmacy.  Please advise pt to stop spironolactone while taking bactrim.  Start and take as directed. Please make appt with urology, as seems to be recurrent/resistant uti.

## 2021-12-26 NOTE — Telephone Encounter (Signed)
Pt called back again asking for test results from urine sample? Call back # [phone]

## 2021-12-27 ENCOUNTER — Telehealth: Payer: Self-pay

## 2021-12-27 ENCOUNTER — Ambulatory Visit: Payer: Medicare PPO | Admitting: Clinical

## 2021-12-27 NOTE — Telephone Encounter (Signed)
Patient called in and cancelled appointment.

## 2021-12-27 NOTE — Telephone Encounter (Signed)
[place] Night - Client Nonclinical Telephone Record  [facility] Client [facility] Night - Client Client Site [place] Provider Loura Pardon - MD Contact Type Call Who Is Calling Patient / Member / Family / Caregiver Caller Name Rosharon Phone Number [phone] Patient Name Mackenzie Bonilla Patient DOB 1965-05-17 Call Type Message Only Information Provided Reason for Call Request to [facility] Appointment Initial Comment Caller states she needs to cancel her apt tomorrow at 130pm. Caller did deny triage. Caller is having pain but did deny triage and wants to take medication but then will not be able to drive tomorrow. Patient request to speak to RN No Additional Comment Office hours provided. Did deny triage. Disp. Time Disposition Final User 12/26/2021 6:45:46 PM General Information Provided Yes Vena Rua Call Closed By: Vena Rua Transaction Date/Time: 12/26/2021 6:42:39 PM (ET

## 2021-12-29 ENCOUNTER — Ambulatory Visit: Payer: Medicare PPO | Admitting: Family Medicine

## 2021-12-29 NOTE — Assessment & Plan Note (Signed)
poct urine dip in office Urine culture ordered pending results  

## 2021-12-29 NOTE — Assessment & Plan Note (Signed)
D/w pt and advised at this time, has had six UTI's in the last six months, referral again placed for urology. Pt to make appt.

## 2021-12-29 NOTE — Assessment & Plan Note (Signed)
Ordering urine culture, pending results to treat as possible resistance to prior UTI.

## 2022-01-01 ENCOUNTER — Telehealth: Payer: Self-pay | Admitting: *Deleted

## 2022-01-01 NOTE — Telephone Encounter (Signed)
Patient is calling to request Gabapentin 300 mg capsules ,one tablet '@night'$  only,30 day supply.  She would also like the 100 mg capsules , 2 in the mornings, 3 mos. Supply. The last prescription was not written correctly per patient.

## 2022-01-02 MED ORDER — GABAPENTIN 300 MG PO CAPS: 300.0000 mg | ORAL_CAPSULE | Freq: Every day | ORAL | 1 refills | Status: DC

## 2022-01-02 MED ORDER — GABAPENTIN 100 MG PO CAPS: 100.0000 mg | ORAL_CAPSULE | Freq: Three times a day (TID) | ORAL | 3 refills | Status: DC

## 2022-01-02 NOTE — Telephone Encounter (Signed)
Called patient to inform that medication has been sent to pharmacy, no answer, could not leave voice message.

## 2022-01-03 ENCOUNTER — Ambulatory Visit (INDEPENDENT_AMBULATORY_CARE_PROVIDER_SITE_OTHER): Payer: Medicare PPO | Admitting: Clinical

## 2022-01-03 ENCOUNTER — Encounter: Payer: Self-pay | Admitting: Urology

## 2022-01-03 ENCOUNTER — Ambulatory Visit: Payer: Medicare PPO | Admitting: Urology

## 2022-01-03 VITALS — BP 96/68 | HR 91 | Ht 64.0 in | Wt 280.0 lb

## 2022-01-03 DIAGNOSIS — K5909 Other constipation: Secondary | ICD-10-CM | POA: Diagnosis not present

## 2022-01-03 DIAGNOSIS — R8271 Bacteriuria: Secondary | ICD-10-CM

## 2022-01-03 DIAGNOSIS — Z87442 Personal history of urinary calculi: Secondary | ICD-10-CM

## 2022-01-03 DIAGNOSIS — F411 Generalized anxiety disorder: Secondary | ICD-10-CM

## 2022-01-03 DIAGNOSIS — N3281 Overactive bladder: Secondary | ICD-10-CM | POA: Diagnosis not present

## 2022-01-03 DIAGNOSIS — F332 Major depressive disorder, recurrent severe without psychotic features: Secondary | ICD-10-CM | POA: Diagnosis not present

## 2022-01-03 DIAGNOSIS — N390 Urinary tract infection, site not specified: Secondary | ICD-10-CM | POA: Diagnosis not present

## 2022-01-03 LAB — MICROSCOPIC EXAMINATION: Epithelial Cells (non renal): >10 /hpf — AB (ref 0–10)

## 2022-01-03 LAB — URINALYSIS, COMPLETE
Bilirubin, UA: NEGATIVE
Glucose, UA: NEGATIVE
Ketones, UA: NEGATIVE
Nitrite, UA: NEGATIVE
Protein,UA: NEGATIVE
RBC, UA: NEGATIVE
Specific Gravity, UA: 1.02 (ref 1.005–1.030)
Urobilinogen, Ur: 0.2 mg/dL (ref 0.2–1.0)
pH, UA: 5 (ref 5.0–7.5)

## 2022-01-03 LAB — BLADDER SCAN AMB NON-IMAGING

## 2022-01-03 MED ORDER — DOCUSATE SODIUM 100 MG PO CAPS: 100.0000 mg | ORAL_CAPSULE | Freq: Two times a day (BID) | ORAL | 3 refills | Status: DC

## 2022-01-03 NOTE — Patient Instructions (Signed)
Use colace twice a day every day. Miralax as needed with a goal of a bowel movement daily. If you think you have a urinary tract infection, please call our office for a cath specimen.

## 2022-01-03 NOTE — Progress Notes (Signed)
                Luann Aspinwall, LCSW 

## 2022-01-03 NOTE — Progress Notes (Signed)
01/03/2022 10:40 AM   Mackenzie Bonilla 12/20/1965 [id number]  Referring provider: Eugenia Pancoast, [place],  [place] [address]  Chief Complaint  Patient presents with   New Patient (Initial Visit)   Recurrent UTI    HPI: 56 year old female who presents today for further evaluation of possible recurrent UTIs.  She reports that about every month or so, she started to have dark urine, especially in the morning which clears as the day.  Symptoms remain*, and will be foul-smelling and sometimes a little bit cloudy.  When she has the symptoms, she will notify her PCP and provide a urine specimen for concern for infection.  Each time, the urine appears somewhat contaminated with epithelial cells, WBCs, many bacteria and almost always gross E. coli.  When she takes antibiotics sometimes this seems to improve but then returns.  She does have some baseline urinary symptoms including urinary frequency and urgency.  The urgency is primarily when she stands up, she feels like sometimes she cannot hold her to get to the bathroom in time.  She gets up once or night to urinate.  She denies any enuresis.  She is only wearing a safety pad as needed.  This is not particularly bothersome to her.  She is never tried OAB medications.  She mentions today that she struggles with chronic constipation.  Sometimes she will only have a bowel movement every 5 to 7 days.  She will have hard rabbit stools and feels like she has to wipe her bottom frequently to get the hard stool out of her buttock.  This very bothersome to her.  She is try Ezol, psyllium powder but does not tolerate these particularly well.  She is never tried a stool softener.  She has cramping with stimulant laxatives.  Lastly, she reports today whenever she provides a urine sample, she provides it and a hat.  She cannot wipe front to back due to her habitus.  She also has trouble providing a specimen sterilely.  She is never  been catheterized for specimen.  She mentions today that she is not too familiar with her genitourinary and pelvic anatomy.  She has never been sexually active.  She has difficulty seeing this area due to her size mobility.  PVR 0   PMH: Past Medical History:  Diagnosis Date   Allergy    allergic rhinitis   Anemia    Anxiety    Arthritis    osteoarthritis   Asthma    Claustrophobia    does not like oxygen mask covering face   Depression    Diabetes mellitus without complication ([place])    Fall 2016   Fatty liver 07/2008   abd. ultrasound - fatty liver ; slt dilated cbd (no stones ) 06/10// abd.  ultrasound normal on 04/2006   Fibromyalgia    GERD (gastroesophageal reflux disease)    EGD negative 06/2001// EGD erythematous mucosa, polyp 08/2008   High uric acid in 24 hour urine specimen    Hyperhidrosis    Hyperlipidemia    Kidney stones    Lymphedema    Obesity    PCOS (polycystic ovarian syndrome)    Shortness of breath dyspnea    Tachycardia    TMJ (dislocation of temporomandibular joint)    UTI (lower urinary tract infection)     Surgical History: Past Surgical History:  Procedure Laterality Date   BREAST BIOPSY Right 2016   neg   BREAST LUMPECTOMY WITH RADIOACTIVE  SEED LOCALIZATION Right 08/02/2014   Procedure: BREAST LUMPECTOMY WITH RADIOACTIVE SEED LOCALIZATION;  Surgeon: Rolm Bookbinder, MD;  Location: [facility];  Service: General;  Laterality: Right;   COLONOSCOPY  08/12/2012   Completed by Dr. Phylis Bougie, normal exam.    ENDOMETRIAL BIOPSY  01/2004   ESOPHAGOGASTRODUODENOSCOPY     ESOPHAGOGASTRODUODENOSCOPY (EGD) WITH PROPOFOL N/A 08/18/2019   Procedure: ESOPHAGOGASTRODUODENOSCOPY (EGD) WITH PROPOFOL;  Surgeon: Lucilla Lame, MD;  Location: [facility] ENDOSCOPY;  Service: Endoscopy;  Laterality: N/A;   FOOT SURGERY     SEPTOPLASTY     two times   TONSILLECTOMY     UTERINE FIBROID SURGERY  06/2005   ablation   uterine tumor  09/2001   VAGUS NERVE STIMULATOR INSERTION       Home Medications:  Allergies as of 01/03/2022       Reactions   Cephalexin    Codeine    rash   Duloxetine    Levaquin [levofloxacin] Nausea Only   Lisinopril Cough   Metformin And Related Diarrhea   GI side effects    Quetiapine Other (See Comments)   Sertraline Hcl    REACTION: vivid dreams   Triazolam    REACTION: ? reaction   Zaleplon    Zocor [simvastatin - High Dose]    achey   Zolpidem Tartrate    hallucinations   Zyrtec [cetirizine]    Sedation    Hydrocodone Itching, Rash   REACTION: redness rash itching   Norelgestromin-eth Estradiol    Patch didn't stick to skin        Medication List        Accurate as of January 03, 2022 11:59 PM. If you have any questions, ask your nurse or doctor.          acetaminophen 500 MG tablet Commonly known as: TYLENOL Take 500 mg by mouth every 6 (six) hours as needed.   albuterol 108 (90 Base) MCG/ACT inhaler Commonly known as: VENTOLIN HFA INHALE 2 PUFFS INTO THE LUNGS EVERY 6 HOURS AS NEEDED FOR WHEEZING.   ALPRAZolam 1 MG tablet Commonly known as: XANAX Take 2 mg by mouth 3 (three) times daily as needed.   ammonium lactate 12 % cream Commonly known as: AMLACTIN Apply 1 application. topically in the morning and at bedtime. For dry skin   buPROPion 150 MG 24 hr tablet Commonly known as: WELLBUTRIN XL Take 150 mg by mouth daily.   clindamycin 1 % lotion Commonly known as: Cleocin-T Apply topically daily. Qd to bumps on face and chest   cyclobenzaprine 5 MG tablet Commonly known as: FLEXERIL TAKE 1 TABLET BY MOUTH 3 TIMES DAILY AS NEEDED FOR MUSCLE SPASMS   desvenlafaxine 100 MG 24 hr tablet Commonly known as: PRISTIQ Take 100 mg by mouth daily.   dimenhyDRINATE 50 MG tablet Commonly known as: DRAMAMINE Take 50 mg by mouth every 8 (eight) hours as needed.   docusate sodium 100 MG capsule Commonly known as: COLACE Take 1 capsule (100 mg total) by mouth 2 (two) times daily. Started by:  Hollice Espy, MD   Fifty50 Glucose Meter 2.0 w/Device Kit Use as directed   fluticasone 50 MCG/ACT nasal spray Commonly known as: FLONASE Place into both nostrils daily.   gabapentin 100 MG capsule Commonly known as: NEURONTIN TAKE 2 CAPSULES BY MOUTH EVERY MORNING AND 3 CAPSULES EVERY EVENING   gabapentin 100 MG capsule Commonly known as: NEURONTIN Take 1 capsule (100 mg total) by mouth 3 (three) times daily.   gabapentin 300  MG capsule Commonly known as: NEURONTIN Take 1 capsule (300 mg total) by mouth at bedtime.   GAS-X PO Take by mouth as needed.   ibuprofen 600 MG tablet Commonly known as: ADVIL Take 1 tablet (600 mg total) by mouth every 8 (eight) hours as needed.   ketoconazole 2 % cream Commonly known as: NIZORAL Apply 1 application topically daily.   losartan 25 MG tablet Commonly known as: COZAAR   minoxidil 2.5 MG tablet Commonly known as: LONITEN Take 0.5 tablets (1.25 mg total) by mouth daily.   minoxidil 2.5 MG tablet Commonly known as: LONITEN Take by mouth.   Mounjaro 7.5 MG/0.5ML Pen Generic drug: tirzepatide Inject 12.5 mg into the skin once a week.   nitrofurantoin (macrocrystal-monohydrate) 100 MG capsule Commonly known as: MACROBID Take 1 capsule (100 mg total) by mouth 2 (two) times daily.   nystatin cream Commonly known as: MYCOSTATIN APPLY TO AFFECTED AREAS 3 TIMES DAILY ASNEEDED   omeprazole 20 MG capsule Commonly known as: PRILOSEC TAKE 1 CAPSULE BY MOUTH 2 TIMES DAILY.   OVER THE COUNTER MEDICATION Take 1 capsule by mouth daily. BARIATRIC MULTIVITAMIN IRON FREE   oxybutynin 15 MG 24 hr tablet Commonly known as: DITROPAN XL TAKE 1 TABLET BY MOUTH DAILY   pravastatin 20 MG tablet Commonly known as: PRAVACHOL 1 TABLET BY MOUTH AT BEDTIME.   psyllium 58.6 % powder Commonly known as: METAMUCIL Take 1 packet by mouth 2 (two) times daily.   spironolactone 100 MG tablet Commonly known as: ALDACTONE TAKE 1 TABLET BY  MOUTH DAILY   STOOL SOFTENER PO Take 1-2 tablets by mouth daily as needed.   SYSTANE OP Apply 1 drop to eye daily as needed.   traMADol 50 MG tablet Commonly known as: ULTRAM TAKE ONE TABLET TWICE A DAY IF NEEDED FOR SEVERE PAIN   traZODone 150 MG tablet Commonly known as: DESYREL Take 300 mg by mouth at bedtime.   TRESIBA [place] Inject into the skin. 1 injection nightly 8-10 pm   VITAMIN D3 PO Take 2,000 Units by mouth daily.   zinc oxide 11.3 % Crea cream Commonly known as: BALMEX Apply 1 application topically 2 (two) times daily.        Allergies:  Allergies  Allergen Reactions   Cephalexin    Codeine     rash   Duloxetine    Levaquin [Levofloxacin] Nausea Only   Lisinopril Cough   Metformin And Related Diarrhea    GI side effects    Quetiapine Other (See Comments)   Sertraline Hcl     REACTION: vivid dreams   Triazolam     REACTION: ? reaction   Zaleplon    Zocor [Simvastatin - High Dose]     achey   Zolpidem Tartrate     hallucinations   Zyrtec [Cetirizine]     Sedation    Hydrocodone Itching and Rash    REACTION: redness rash itching   Norelgestromin-Eth Estradiol     Patch didn't stick to skin    Family History: Family History  Problem Relation Age of Onset   Hypertension Father    Cancer Father        pancreatic cancer   Hypertension Sister    Heart disease Maternal Grandmother 60       MI   Diabetes Maternal Grandmother    Heart disease Paternal Grandmother 22       MI   Diabetes Paternal Grandmother    Breast cancer Neg Hx  Social History:  reports that she has never smoked. She has never used smokeless tobacco. She reports that she does not drink alcohol and does not use drugs.   Physical Exam: BP 96/68   Pulse 91   Ht _0  (1.626 m)   Wt 280 lb (127 kg)   BMI 48.06 kg/m   Constitutional:  Alert and oriented, No acute distress. HEENT: [place] AT, moist mucus membranes.  Trachea midline, no masses. Cardiovascular: No  clubbing, cyanosis, or edema. Respiratory: Normal respiratory effort, no increased work of breathing. GI: Abdomen is obese. Skin: No rashes, bruises or suspicious lesions. Neurologic: Grossly intact, no focal deficits, moving all 4 extremities. Psychiatric: Normal mood and affect.  Laboratory Data: Lab Results  Component Value Date   WBC 8.9 08/25/2021   HGB 13.9 08/25/2021   HCT 42.0 08/25/2021   MCV 96.4 08/25/2021   PLT 231.0 08/25/2021    Lab Results  Component Value Date   CREATININE 1.04 08/25/2021    Lab Results  Component Value Date   HGBA1C 7.0 04/17/2017    Urinalysis Results for orders placed or performed in visit on 01/03/22  Microscopic Examination   Urine  Result Value Ref Range   WBC, UA 11-30 (A) 0 - 5 /hpf   RBC, Urine 0-2 0 - 2 /hpf   Epithelial Cells (non renal) >10 (A) 0 - 10 /hpf   Bacteria, UA Many (A) None seen/Few  Urinalysis, Complete  Result Value Ref Range   Specific Gravity, UA 1.020 1.005 - 1.030   pH, UA 5.0 5.0 - 7.5   Color, UA Yellow Yellow   Appearance Ur Hazy (A) Clear   Leukocytes,UA Trace (A) Negative   Protein,UA Negative Negative/Trace   Glucose, UA Negative Negative   Ketones, UA Negative Negative   RBC, UA Negative Negative   Bilirubin, UA Negative Negative   Urobilinogen, Ur 0.2 0.2 - 1.0 mg/dL   Nitrite, UA Negative Negative   Microscopic Examination See below:   Bladder Scan (Post Void Residual) in office  Result Value Ref Range   Scan Result 0ML      Pertinent Imaging: DG Abd 1 View  Narrative CLINICAL DATA:  Abdominal pain with constipation.  EXAM: ABDOMEN - 1 VIEW  COMPARISON:  Abdominal x-ray 06/05/2011. CT abdomen and pelvis 06/06/2011.  FINDINGS: The bowel gas pattern is normal. There is moderate stool burden. Questionable left renal calculus identified measuring 5 mm similar to prior CT. Calcifications in the pelvis are favored as phleboliths, although distal ureteral or bladder calculus would  be difficult to exclude. No acute fractures are identified. Visualized lung bases are clear.  IMPRESSION: 1. Questionable left renal calculus measuring 5 mm as seen on prior CT. 2. Nonobstructive bowel gas pattern.   Electronically Signed By: Ronney Asters M.D. On: 06/12/2021 21:55   Assessment & Plan:    1. Chronic bacteriuria Based on the nature of her symptoms which are primarily related to the quality odor of her urine, I do not suspect that these are actually infections.  Suspect either chronic bacterial colonization versus collection technique.  We discussed that if she develops any systemic signs of infection such as fevers or chills or lower urinary tract symptoms of which are different from her baseline including motion and urinary frequency, low back pain, abdominal/pelvic pain, dysuria or gross hematuria, elevated indication for further assessment.  At that time, we would recommend catheterization for a specimen given her habitus and inability to collect a reasonable contaminated sample.  She agrees with this plan.  We also discussed her GU and GI anatomy, is unfamiliar with these which were discussed in length.  Behavioral modification as well as hygiene issues were discussed. - Urinalysis, Complete - Bladder Scan (Post Void Residual) in office  2. Chronic constipation This is a significant contributing factor to her urinary urgency frequency symptoms.  Will start her on Colace twice daily and use MiraLAX as needed.  We discussed the goals would include 1 well-formed bowel movement daily.  She understands and is agreeable this plan.  3. History of kidney stones Personal history of kidney stones, does not appear that she has any recent obstructing stones.  Given her habitus and comorbidities, would strongly recommend deferring treatment for this unless she becomes symptomatic.  Previous images were also personally reviewed today.,  Multiple nonobstructing stones.  4.  OAB (overactive bladder) We discussed bowel bladder syndrome which is contributing factor to this.  Overall, her urgency frequency is reasonably well-controlled and she has less urge incontinence than previously.  We discussed addressing her bowel issues and seeing if her urinary symptoms improved.  Prefers to avoid anticholinergics given her severe chronic constipation.  Could consider beta 3 agonist, at this time she like to defer this.   Return if symptoms worsen or fail to improve.  Hollice Espy, MD  [facility] [address], [place], [place] [address] [phone]

## 2022-01-03 NOTE — Progress Notes (Signed)
[facility] Counselor/Therapist Progress Note  Patient ID: Mackenzie Bonilla, MRN: [id number],    Date: 01/03/2022  Time Spent: 1:30pm - 2:22pm : 52 minutes  Treatment Type: Individual Therapy  Reported Symptoms: Patient reported feelings of anger, anxiety, and guilt in response to current stressors.   Mental Status Exam: Appearance:  Could not assess      Behavior: Could not assess  Motor: Could not assess  Speech/Language:  Clear and Coherent  Affect: Could not assess  Mood: anxious  Thought process: normal  Thought content:   WNL  Sensory/Perceptual disturbances:   WNL  Orientation: oriented to person, place, and situation  Attention: Good  Concentration: Good  Memory: WNL  Fund of knowledge:  Good  Insight:   Fair  Judgment:  Good  Impulse Control: Fair   Risk Assessment: Danger to Self:  No Patient denied current suicidal ideation  Self-injurious Behavior: No Danger to Others: No Patient denied current homicidal ideation  Duty to Warn:no Physical Aggression / Violence:No  Access to Firearms a concern: No  Gang Involvement:No   Subjective: Patient stated, she is "stressed out to the max" due to multiple stressors. Patient reported neighbor's dog barking next door, multiple appointments, and finances are current stressors. Patient reported she changed today's appointment to a telephone appointment due to conflict with another appointment and concern about travel time. Patient reported she has reported the dog barking to the leasing office and has talked with the owner. Patient reported feeling angry in response to dog barking. Patient reported the last time she left her home to cope with the dog barking. Patient reported she turns the volume up on her television and uses a sound machine in response to the dog barking but reported she is concerned about the impact of the television volume on her neighbors. Patient reported feeling anxious that the barking  will resume and feels guilty for turning her neighbor's into the leasing office. Patient stated the barking "ruins the rest of the day". Patient reported she doesn't feel she should have to wear headphones in her home. Patient reported she has utilized imagery in the past and thought of herself sitting in the middle of a field of flowers. Patient reported financial stressors and reported feeling she is losing control of her finances.   Interventions: Cognitive Behavioral Therapy. Clinician conducted session via telephone from clinician's office at [facility] per patient's request. Patient provided verbal consent to proceed with telehealth session and participated in session from patient's home.  Discussed current stressors and patient's thoughts/feelings in response to current stressors. Discussed strategies patient has utilized in response to neighbor's dog barking consistently and explored other strategies to cope with the dog barking, such as, using headphones, stress ball, imagery, leaving the apartment for a while. Provided psycho education related to use of imagery as a coping mechanism.  Explored financial resources, such as, [employer] care.     Diagnosis:  Generalized anxiety disorder   Severe episode of recurrent major depressive disorder, without psychotic features ([place])      Plan: Patient is to utilize [facility], thought re-framing, dialectical behavior therapy skills, and coping strategies to decrease symptoms associated with Generalized Anxiety Disorder and Major Depressive Disorder. Frequency: bi-weekly  Modality: individual      Long-term goal:   Reduce overall level, frequency, and intensity of the symptoms of depression and anxiety as evidenced by decreased difficulty making decisions, feeling anxious, staying at home, nightmares, difficulty staying asleep, racing thoughts,  worry about her finances, decreased concentration, restlessness, excessive worry,  lack of energy, and increased appetite from 6 to 7 days per week to 0 to 1 days per week per patient's report for at least 3 consecutive months.   Target Date: 12/07/22  Progress: progressing    Short-term goal:  Increase daily care of personal grooming and hygiene as evidenced by showering and getting dressed daily   Target Date: 06/07/22  Progress: progressing    Increase daily activities related to household chores, such as cleaning,  laundry, paying monthly bills Target Date: 06/07/22  Progress: progressing    Write at least one positive affirmation about herself daily   Target Date: 06/07/22  Progress: progressing    Learn and implement social skills to reduce depressive symptoms and build confidence in social interactions   Target Date: 06/07/22  Progress: progressing    Identify negative self talk used to reinforce low self esteem and replace with positive self talk   Target Date: 06/07/22  Progress: progressing    Increase participation in social opportunities from 0 days per week to 1 days per week   Target Date: 06/07/22  Progress: progressing    Decrease feelings of anger, depression, and anxiety by developing healthy coping mechanisms   Target Date: 06/07/22  Progress: progressing                         Katherina Right, LCSW

## 2022-01-09 ENCOUNTER — Ambulatory Visit: Payer: Medicare PPO | Admitting: Podiatry

## 2022-01-09 DIAGNOSIS — M79675 Pain in left toe(s): Secondary | ICD-10-CM

## 2022-01-09 DIAGNOSIS — B351 Tinea unguium: Secondary | ICD-10-CM | POA: Diagnosis not present

## 2022-01-09 DIAGNOSIS — M79674 Pain in right toe(s): Secondary | ICD-10-CM | POA: Diagnosis not present

## 2022-01-09 NOTE — Progress Notes (Signed)
Chief Complaint  Patient presents with   foot care    Patient is here for diabetic foot care.    SUBJECTIVE Patient presents to office today complaining of elongated, thickened nails that cause pain while ambulating in shoes.  Patient is unable to trim their own nails. Patient is here for further evaluation and treatment.  Past Medical History:  Diagnosis Date   Allergy    allergic rhinitis   Anemia    Anxiety    Arthritis    osteoarthritis   Asthma    Claustrophobia    does not like oxygen mask covering face   Depression    Diabetes mellitus without complication ([employer])    Fall 2016   Fatty liver 07/2008   abd. ultrasound - fatty liver ; slt dilated cbd (no stones ) 06/10// abd.  ultrasound normal on 04/2006   Fibromyalgia    GERD (gastroesophageal reflux disease)    EGD negative 06/2001// EGD erythematous mucosa, polyp 08/2008   High uric acid in 24 hour urine specimen    Hyperhidrosis    Hyperlipidemia    Kidney stones    Lymphedema    Obesity    PCOS (polycystic ovarian syndrome)    Shortness of breath dyspnea    Tachycardia    TMJ (dislocation of temporomandibular joint)    UTI (lower urinary tract infection)     Allergies  Allergen Reactions   Cephalexin    Codeine     rash   Duloxetine    Levaquin [Levofloxacin] Nausea Only   Lisinopril Cough   Metformin And Related Diarrhea    GI side effects    Quetiapine Other (See Comments)   Sertraline Hcl     REACTION: vivid dreams   Triazolam     REACTION: ? reaction   Zaleplon    Zocor [Simvastatin - High Dose]     achey   Zolpidem Tartrate     hallucinations   Zyrtec [Cetirizine]     Sedation    Hydrocodone Itching and Rash    REACTION: redness rash itching   Norelgestromin-Eth Estradiol     Patch didn't stick to skin     OBJECTIVE General Patient is awake, alert, and oriented x 3 and in no acute distress. Derm Skin is dry and supple bilateral. Negative open lesions or macerations. Remaining  integument unremarkable. Nails are tender, long, thickened and dystrophic with subungual debris, consistent with onychomycosis, 1-5 bilateral. No signs of infection noted. Vasc  DP and PT pedal pulses palpable bilaterally. Temperature gradient within normal limits.  Neuro Epicritic and protective threshold sensation grossly intact bilaterally.  Musculoskeletal Exam No symptomatic pedal deformities noted bilateral. Muscular strength within normal limits.  ASSESSMENT 1.  Pain due to onychomycosis of toenails both 2.  Diabetes mellitus  PLAN OF CARE 1. Patient evaluated today.  Comprehensive diabetic foot exam performed today 2. Instructed to maintain good pedal hygiene and foot care.  3. Mechanical debridement of nails 1-5 bilaterally performed using a nail nipper. Filed with dremel without incident.  4. Return to clinic in 3 mos.    Edrick Kins, DPM [facility]  Dr. Edrick Kins, DPM    [address], [place] [address]  Office [phone]  Fax [phone]

## 2022-01-10 ENCOUNTER — Ambulatory Visit (INDEPENDENT_AMBULATORY_CARE_PROVIDER_SITE_OTHER): Payer: Medicare PPO | Admitting: Clinical

## 2022-01-10 DIAGNOSIS — F332 Major depressive disorder, recurrent severe without psychotic features: Secondary | ICD-10-CM

## 2022-01-10 DIAGNOSIS — F411 Generalized anxiety disorder: Secondary | ICD-10-CM | POA: Diagnosis not present

## 2022-01-10 NOTE — Progress Notes (Signed)
[place] Counselor/Therapist Progress Note  Patient ID: Mackenzie Bonilla, MRN: [id number],    Date: 01/10/2022  Time Spent: 1:32pm - 2:40pm : 68 minutes  Treatment Type: Individual Therapy  Reported Symptoms: Patient reported depressed mood "comes and goes" and recent weight gain.   Mental Status Exam: Appearance:  Well Groomed     Behavior: Appropriate  Motor: Mannerisms  Speech/Language:  Clear and Coherent  Affect: Tearful  Mood: depressed  Thought process: circumstantial  Thought content:   circumstantial  Sensory/Perceptual disturbances:   WNL  Orientation: oriented to person, place, and situation  Attention: Good  Concentration: Good  Memory: WNL  Fund of knowledge:  Good  Insight:   Fair  Judgment:  Good  Impulse Control: Fair   Risk Assessment: Danger to Self:  No Patient denied current suicidal ideation Self-injurious Behavior: No Danger to Others: No Patient denied current homicidal ideation Duty to Warn:no Physical Aggression / Violence:No  Access to Firearms a concern: No  Gang Involvement:No   Subjective: Patient reported she called the apartment office and spoke with two different staff regarding the neighbor's dog barking. Patient reported she kept a record of when the dog barks and shared this information with the apartment staff. Patient reported she received a significantly different response during the phone call from apartment staff and reported the apartment staff indicated they did not know what other options they have to address the situation. Patient reported frustration in response to apartment staff's response. Patient reported she purchased a cd player and headphones to utilize in response to the dog barking but has not had to utilize the headphones yet. Patient reported she has several cd's she plans to listen to and reported one cd is a meditation cd. Patient reported fear of  the apartment complex making "things complicated  for me" if she continues to advocate for herself. Patient reported recent weight gain due to stress and reported eating in response to stress. Patient reported she is fearful of using her bathroom due to a history of sewage issues at the apartment complex.    Interventions: Cognitive Behavioral Therapy. Reviewed stressor discussed during previous session regarding neighbor's dog barking and outcome of coping strategies utilized in response.  Provided supportive therapy, active listening, and validation as patient discussed current stressors regarding her apartment complex and their response to various situations. Processed patient's thoughts/feelings associated with current stressors. Discussed resources for patient to seek advice regarding concerns about the apartment complex's responses. Clinician requested patient complete thought record for homework.     Diagnosis:  Generalized anxiety disorder   Severe episode of recurrent major depressive disorder, without psychotic features ([place])      Plan: Patient is to utilize [employer] Therapy, thought re-framing, dialectical behavior therapy skills, and coping strategies to decrease symptoms associated with Generalized Anxiety Disorder and Major Depressive Disorder. Frequency: bi-weekly  Modality: individual      Long-term goal:   Reduce overall level, frequency, and intensity of the symptoms of depression and anxiety as evidenced by decreased difficulty making decisions, feeling anxious, staying at home, nightmares, difficulty staying asleep, racing thoughts, worry about her finances, decreased concentration, restlessness, excessive worry, lack of energy, and increased appetite from 6 to 7 days per week to 0 to 1 days per week per patient's report for at least 3 consecutive months.   Target Date: 12/07/22  Progress: progressing    Short-term goal:  Increase daily care of personal grooming and hygiene as evidenced by showering and getting  dressed daily   Target Date: 06/07/22  Progress: progressing    Increase daily activities related to household chores, such as cleaning,  laundry, paying monthly bills Target Date: 06/07/22  Progress: progressing    Write at least one positive affirmation about herself daily   Target Date: 06/07/22  Progress: progressing    Learn and implement social skills to reduce depressive symptoms and build confidence in social interactions   Target Date: 06/07/22  Progress: progressing    Identify negative self talk used to reinforce low self esteem and replace with positive self talk   Target Date: 06/07/22  Progress: progressing    Increase participation in social opportunities from 0 days per week to 1 days per week   Target Date: 06/07/22  Progress: progressing    Decrease feelings of anger, depression, and anxiety by developing healthy coping mechanisms   Target Date: 06/07/22  Progress: progressing    Katherina Right, LCSW

## 2022-01-10 NOTE — Progress Notes (Signed)
                Delecia Vastine, LCSW 

## 2022-01-23 ENCOUNTER — Telehealth: Payer: Self-pay | Admitting: Podiatry

## 2022-01-23 NOTE — Telephone Encounter (Signed)
Pt called to leave message for evans

## 2022-01-25 DIAGNOSIS — E669 Obesity, unspecified: Secondary | ICD-10-CM | POA: Diagnosis not present

## 2022-01-25 DIAGNOSIS — R809 Proteinuria, unspecified: Secondary | ICD-10-CM | POA: Diagnosis not present

## 2022-01-25 DIAGNOSIS — E1169 Type 2 diabetes mellitus with other specified complication: Secondary | ICD-10-CM | POA: Diagnosis not present

## 2022-01-25 DIAGNOSIS — Z794 Long term (current) use of insulin: Secondary | ICD-10-CM | POA: Diagnosis not present

## 2022-01-25 DIAGNOSIS — E1129 Type 2 diabetes mellitus with other diabetic kidney complication: Secondary | ICD-10-CM | POA: Diagnosis not present

## 2022-01-25 DIAGNOSIS — E1142 Type 2 diabetes mellitus with diabetic polyneuropathy: Secondary | ICD-10-CM | POA: Diagnosis not present

## 2022-01-26 DIAGNOSIS — E119 Type 2 diabetes mellitus without complications: Secondary | ICD-10-CM | POA: Diagnosis not present

## 2022-01-30 ENCOUNTER — Ambulatory Visit (INDEPENDENT_AMBULATORY_CARE_PROVIDER_SITE_OTHER): Payer: Medicare PPO | Admitting: Clinical

## 2022-01-30 DIAGNOSIS — F332 Major depressive disorder, recurrent severe without psychotic features: Secondary | ICD-10-CM

## 2022-01-30 DIAGNOSIS — F411 Generalized anxiety disorder: Secondary | ICD-10-CM | POA: Diagnosis not present

## 2022-01-30 NOTE — Progress Notes (Signed)
                July Nickson, LCSW 

## 2022-01-30 NOTE — Progress Notes (Signed)
[place] Counselor/Therapist Progress Note  Patient ID: Mackenzie Bonilla, MRN: [id number],    Date: 01/30/2022  Time Spent: 1:32pm - 2:27pm : 55 minutes  Treatment Type: Individual Therapy  Reported Symptoms: Patient reported difficulty completing sentences at times and changes in concentration.  Mental Status Exam: Appearance:  Could not assess      Behavior: Could not assess  Motor: Could not assess  Speech/Language:  Clear and Coherent  Affect: Could not assess  Mood: normal  Thought process: normal  Thought content:   WNL  Sensory/Perceptual disturbances:   WNL  Orientation: oriented to person, place, and situation  Attention: Good  Concentration: Good  Memory: WNL  Fund of knowledge:  Good  Insight:   Fair  Judgment:  Good  Impulse Control: Fair   Risk Assessment: Danger to Self:  No Patient denied current suicidal ideation Self-injurious Behavior: No Danger to Others: No Patient denied current homicidal ideation Duty to Warn:no Physical Aggression / Violence:No  Access to Firearms a concern: No  Gang Involvement:No   Subjective: Patient reported she requested a telephone session today due to her ankles and feet being swollen.  Patient reported pain as a result of arthritis and bone spurs. Patient reported driving is a trigger for pain. Patient stated, "I have my days" in response to her emotional wellbeing since last session. Patient reported she was recently advised she has a cataract in her eye that could permanently impact her vision. Patient reported changes in her "thought process" and reported when texting or writing she experiences difficulty expressing her thoughts. Patient reported difficulty completing sentences at times. Patient reported when not eating enough food during the day she notices an increase in changes in concentration. Patient reported financial difficulties and reported overspending. Patient reported she has not heard her  neighbor's dog barking recently. Patient reported recent feelings of anger in response to a purchase she made during the summer. Patient reported she contacted an attorney in response to the purchase patient made during the summer to discuss her legal options in response to the situation. Patient reported she was able to exchange some of the items and return some of the items. Patient reported due to lymphedema she has only been able to wear tennis shoes for years which triggered the purchase. Patient reported her initial intention when going to the store was to obtain a complete refund.   Interventions: Cognitive Behavioral Therapy. Clinician conducted session via telephone from clinician's office at [facility] per patient's request. Patient provided verbal consent to proceed with telehealth session and participated in session from patient's home. Provided supportive therapy, active listening, and validation as patient discussed current medical concerns and patient's response to those concerns. Provided psycho education related to symptoms of anxiety and depression. Clinician recommended patient discuss cognitive changes with her primary care provider. Reviewed stressor discussed during previous session regarding neighbor's dog barking and status of stressor. Reviewed patient's thought record for homework. Discussed patient's response to purchase made during the summer, the impact on her mood, and patient's response to the situation. Explored and identified thoughts associated with patient exchanging her purchases versus receiving a full refund.    Diagnosis:  Generalized anxiety disorder   Severe episode of recurrent major depressive disorder, without psychotic features ([place])      Plan: Patient is to utilize [employer] Therapy, thought re-framing, dialectical behavior therapy skills, and coping strategies to decrease symptoms associated with Generalized Anxiety Disorder and Major  Depressive Disorder. Frequency: bi-weekly  Modality: individual      Long-term goal:   Reduce overall level, frequency, and intensity of the symptoms of depression and anxiety as evidenced by decreased difficulty making decisions, feeling anxious, staying at home, nightmares, difficulty staying asleep, racing thoughts, worry about her finances, decreased concentration, restlessness, excessive worry, lack of energy, and increased appetite from 6 to 7 days per week to 0 to 1 days per week per patient's report for at least 3 consecutive months.   Target Date: 12/07/22  Progress: progressing    Short-term goal:  Increase daily care of personal grooming and hygiene as evidenced by showering and getting dressed daily   Target Date: 06/07/22  Progress: progressing    Increase daily activities related to household chores, such as cleaning,  laundry, paying monthly bills Target Date: 06/07/22  Progress: progressing    Write at least one positive affirmation about herself daily   Target Date: 06/07/22  Progress: progressing    Learn and implement social skills to reduce depressive symptoms and build confidence in social interactions   Target Date: 06/07/22  Progress: progressing    Identify negative self talk used to reinforce low self esteem and replace with positive self talk   Target Date: 06/07/22  Progress: progressing    Increase participation in social opportunities from 0 days per week to 1 days per week   Target Date: 06/07/22  Progress: progressing    Decrease feelings of anger, depression, and anxiety by developing healthy coping mechanisms   Target Date: 06/07/22  Progress: progressing    Katherina Right, LCSW

## 2022-01-31 DIAGNOSIS — H6123 Impacted cerumen, bilateral: Secondary | ICD-10-CM | POA: Diagnosis not present

## 2022-01-31 DIAGNOSIS — H902 Conductive hearing loss, unspecified: Secondary | ICD-10-CM | POA: Diagnosis not present

## 2022-02-07 DIAGNOSIS — E119 Type 2 diabetes mellitus without complications: Secondary | ICD-10-CM | POA: Diagnosis not present

## 2022-02-09 ENCOUNTER — Ambulatory Visit: Payer: Medicare PPO | Admitting: Clinical

## 2022-02-13 ENCOUNTER — Encounter: Payer: Self-pay | Admitting: Dermatology

## 2022-02-13 ENCOUNTER — Ambulatory Visit: Payer: Medicare PPO | Admitting: Dermatology

## 2022-02-13 VITALS — BP 121/76 | HR 78

## 2022-02-13 DIAGNOSIS — L649 Androgenic alopecia, unspecified: Secondary | ICD-10-CM

## 2022-02-13 MED ORDER — MINOXIDIL 2.5 MG PO TABS: 2.5000 mg | ORAL_TABLET | Freq: Every day | ORAL | 1 refills | Status: DC

## 2022-02-13 NOTE — Progress Notes (Deleted)
   Follow-Up Visit   Subjective  Mackenzie Bonilla is a 57 y.o. female who presents for the following: Alopecia (3 month recheck. Taking Minoxidil 2.5 mg 1/2 tablet daily. Tolerating well. Noticed hair wasn't coming out as badly for a while but has noticed increase in hair loss recently. She states her BP and cholesterol have been lower since starting oral minoxidil).    The following portions of the chart were reviewed this encounter and updated as appropriate:      Review of Systems: No other skin or systemic complaints except as noted in HPI or Assessment and Plan.   Objective  Well appearing patient in no apparent distress; mood and affect are within normal limits.  {TRRN:[id number]::"U full examination was performed including scalp, head, eyes, ears, nose, lips, neck, chest, axillae, abdomen, back, buttocks, bilateral upper extremities, bilateral lower extremities, hands, feet, fingers, toes, fingernails, and toenails. All findings within normal limits unless otherwise noted below."}   Assessment & Plan   No follow-ups on file.

## 2022-02-13 NOTE — Progress Notes (Deleted)
    Mackenzie Carley T. Sherle Mello, MD, [facility] at [facility] [place], [address]  Phone: [phone]  FAX: [phone]  Mackenzie Bonilla - 57 y.o. female  MRN [id number]  Date of Birth: December 17, 1965  Date: 02/14/2022  PCP: Mackenzie Greenspan, MD  Referral: Mackenzie Greenspan, MD  No chief complaint on file.  Subjective:   Mackenzie Bonilla is a 57 y.o. very pleasant female patient with There is no height or weight on file to calculate BMI. who presents with the following:  She is a well-known patient who I have known for many years who presents with B knee pain:     Review of Systems is noted in the HPI, as appropriate  Objective:   There were no vitals taken for this visit.  GEN: No acute distress; alert,appropriate. PULM: Breathing comfortably in no respiratory distress PSYCH: Normally interactive.   Laboratory and Imaging Data:  Assessment and Plan:   ***

## 2022-02-13 NOTE — Patient Instructions (Addendum)
Recommend referral to neurology by PCP. Discuss with Dr. Glori Bickers.    Not uncommon to notice hair loss with COVID virus and COVID vaccine.   Increase to Minoxidil 2.5 mg 1 tablet once daily from 1/2 tablet once daily  Continue Spironolactone 100 mg as directed by PCP.    Plastic surgeon recommendation: Theodoro Kos Dillingham, DO Address: [address], [place], [place] [address] Phone: [phone]  Doses of minoxidil for hair loss are considered 'low dose'. This is because the doses used for hair loss are a lot lower than the doses which are used for conditions such as high blood pressure (hypertension). The doses used for hypertension are 10-'40mg'$  per day.  Side effects are uncommon at the low doses (up to 2.5 mg/day) used to treat hair loss. Potential side effects, more commonly seen at higher doses, include: Increase in hair growth (hypertrichosis) elsewhere on face and body Temporary hair shedding upon starting medication which may last up to 4 weeks Ankle swelling, fluid retention, rapid weight gain more than 5 pounds Low blood pressure and feeling lightheaded or dizzy when standing up quickly Fast or irregular heartbeat Headaches    Due to recent changes in healthcare laws, you may see results of your pathology and/or laboratory studies on MyChart before the doctors have had a chance to review them. We understand that in some cases there may be results that are confusing or concerning to you. Please understand that not all results are received at the same time and often the doctors may need to interpret multiple results in order to provide you with the best plan of care or course of treatment. Therefore, we ask that you please give [place] 2 business days to thoroughly review all your results before contacting the office for clarification. Should we see a critical lab result, you will be contacted sooner.   If You Need Anything After Your Visit  If you have any questions or concerns  for your doctor, please call our main line at [phone] and press option 4 to reach your doctor's medical assistant. If no one answers, please leave a voicemail as directed and we will return your call as soon as possible. Messages left after 4 pm will be answered the following business day.   You may also send [place] a message via [place]. We typically respond to MyChart messages within 1-2 business days.  For prescription refills, please ask your pharmacy to contact our office. Our fax number is [phone].  If you have an urgent issue when the clinic is closed that cannot wait until the next business day, you can page your doctor at the number below.    Please note that while we do our best to be available for urgent issues outside of office hours, we are not available 24/7.   If you have an urgent issue and are unable to reach [place], you may choose to seek medical care at your doctor's office, retail clinic, urgent care center, or emergency room.  If you have a medical emergency, please immediately call 911 or go to the emergency department.  Pager Numbers  - Dr. Nehemiah Massed: [phone]  - Dr. Laurence Ferrari: [phone]  - Dr. Nicole Kindred: [phone]  In the event of inclement weather, please call our main line at [phone] for an update on the status of any delays or closures.  Dermatology Medication Tips: Please keep the boxes that topical medications come in in order to help keep track of the instructions about where and  how to use these. Pharmacies typically print the medication instructions only on the boxes and not directly on the medication tubes.   If your medication is too expensive, please contact our office at [phone] option 4 or send [place] a message through [place].   We are unable to tell what your co-pay for medications will be in advance as this is different depending on your insurance coverage. However, we may be able to find a substitute medication at lower cost or fill out  paperwork to get insurance to cover a needed medication.   If a prior authorization is required to get your medication covered by your insurance company, please allow [place] 1-2 business days to complete this process.  Drug prices often vary depending on where the prescription is filled and some pharmacies may offer cheaper prices.  The website www.goodrx.com contains coupons for medications through different pharmacies. The prices here do not account for what the cost may be with help from insurance (it may be cheaper with your insurance), but the website can give you the price if you did not use any insurance.  - You can print the associated coupon and take it with your prescription to the pharmacy.  - You may also stop by our office during regular business hours and pick up a GoodRx coupon card.  - If you need your prescription sent electronically to a different pharmacy, notify our office through [facility] or by phone at [phone] option 4.     Si Usted Necesita Algo Despus de Su Visita  Tambin puede enviarnos un mensaje a travs de Pharmacist, community. Por lo general respondemos a los mensajes de MyChart en el transcurso de 1 a 2 das hbiles.  Para renovar recetas, por favor pida a su farmacia que se ponga en contacto con nuestra oficina. Harland Dingwall de fax es [place] [phone].  Si tiene un asunto urgente cuando la clnica est cerrada y que no puede esperar hasta el siguiente da hbil, puede llamar/localizar a su doctor(a) al nmero que aparece a continuacin.   Por favor, tenga en cuenta que aunque hacemos todo lo posible para estar disponibles para asuntos urgentes fuera del horario de [place], no estamos disponibles las 24 horas del da, los 7 das de la [place].   Si tiene un problema urgente y no puede comunicarse con nosotros, puede optar por buscar atencin mdica  en el consultorio de su doctor(a), en una clnica privada, en un centro de atencin urgente o en una sala de  emergencias.  Si tiene Engineering geologist, por favor llame inmediatamente al 911 o vaya a la sala de emergencias.  Nmeros de bper  - Dr. Nehemiah Massed: [phone]  - Dra. Moye: [phone]  - Dra. Nicole Kindred: [phone]  En caso de inclemencias del [place], por favor llame a Johnsie Kindred principal al [phone] para una actualizacin sobre el [place] de cualquier retraso o cierre.  Consejos para la medicacin en dermatologa: Por favor, guarde las cajas en las que vienen los medicamentos de uso tpico para ayudarle a seguir las instrucciones sobre dnde y cmo usarlos. Las farmacias generalmente imprimen las instrucciones del medicamento slo en las cajas y no directamente en los tubos del [place].   Si su medicamento es muy caro, por favor, pngase en contacto con Zigmund Daniel llamando al [phone] y presione la opcin 4 o envenos un mensaje a travs de Pharmacist, community.   No podemos decirle cul ser su copago por los medicamentos por adelantado ya que esto es diferente  dependiendo de la cobertura de su seguro. Sin embargo, es posible que podamos encontrar un medicamento sustituto a Electrical engineer un formulario para que el seguro cubra el medicamento que se considera necesario.   Si se requiere una autorizacin previa para que su compaa de seguros [place] su medicamento, por favor permtanos de 1 a 2 das hbiles para completar este proceso.  Los precios de los medicamentos varan con frecuencia dependiendo del Environmental consultant de dnde se surte la receta y alguna farmacias pueden ofrecer precios ms baratos.  El sitio web www.goodrx.com tiene cupones para medicamentos de Airline pilot. Los precios aqu no tienen en cuenta lo que podra costar con la ayuda del seguro (puede ser ms barato con su seguro), pero el sitio web puede darle el precio si no utiliz Research scientist (physical sciences).  - Puede imprimir el cupn correspondiente y llevarlo con su receta a la farmacia.  - Tambin puede pasar por  nuestra oficina durante el horario de atencin regular y Charity fundraiser una tarjeta de cupones de GoodRx.  - Si necesita que su receta se enve electrnicamente a una farmacia diferente, informe a nuestra oficina a travs de MyChart de [place] o por telfono llamando al [phone] y presione la opcin 4.

## 2022-02-13 NOTE — Progress Notes (Signed)
   Follow-Up Visit   Subjective  Mackenzie Bonilla is a 57 y.o. female who presents for the following: Alopecia (3 month recheck. Taking Minoxidil 2.5 mg 1/2 tablet daily. Tolerating well. Noticed hair wasn't coming out as badly for a while but has noticed increase in hair loss recently. Did get COVID booster end of November. She states her BP and cholesterol have been lower since starting oral minoxidil).    The following portions of the chart were reviewed this encounter and updated as appropriate:      Review of Systems: No other skin or systemic complaints except as noted in HPI or Assessment and Plan.   Objective  Well appearing patient in no apparent distress; mood and affect are within normal limits.  A focused examination was performed including head, including the scalp, face, neck, nose, ears, eyelids, and lips. Relevant physical exam findings are noted in the Assessment and Plan.  Scalp New hair growth at frontal and temporal scalp hairline. Thinning at crown and temporal scalp  BP 121/76        Assessment & Plan  Androgenetic alopecia Scalp  Chronic and persistent condition with duration or expected duration over one year. Condition is symptomatic / bothersome to patient. Not to goal.   Not uncommon to notice delayed hair loss with COVID virus and COVID vaccine.   Increase to Minoxidil 2.5 mg 1 tablet once daily from 1/2 tablet once daily  Continue Spironolactone 100 mg as directed by PCP.   Female Androgenic Alopecia is a chronic condition related to genetics and/or hormonal changes.  In women androgenetic alopecia is commonly associated with menopause but may occur any time after puberty.  It causes hair thinning primarily on the crown with widening of the part and temporal hairline recession.  Can use OTC Rogaine (minoxidil) 5% solution/foam as directed.  Oral treatments in female patients who have no contraindication may include : - Low dose oral minoxidil  1.25 - '5mg'$  daily - Spironolactone 50 - '100mg'$  bid - Finasteride 2.5 - 5 mg daily Adjunctive therapies include: - Low Level Laser Light Therapy (LLLT) - Platelet-rich plasma injections (PRP) - Hair Transplants or scalp reduction   Doses of minoxidil for hair loss are considered 'low dose'. This is because the doses used for hair loss are a lot lower than the doses which are used for conditions such as high blood pressure (hypertension). The doses used for hypertension are 10-'40mg'$  per day.  Side effects are uncommon at the low doses (up to 2.5 mg/day) used to treat hair loss. Potential side effects, more commonly seen at higher doses, include: Increase in hair growth (hypertrichosis) elsewhere on face and body Temporary hair shedding upon starting medication which may last up to 4 weeks Ankle swelling, fluid retention, rapid weight gain more than 5 pounds Low blood pressure and feeling lightheaded or dizzy when standing up quickly Fast or irregular heartbeat Headaches   minoxidil (LONITEN) 2.5 MG tablet - Scalp Take 1 tablet (2.5 mg total) by mouth daily.   Return in about 3 months (around 05/15/2022) for Alopecia Follow Up.  I, Emelia Salisbury, CMA, am acting as scribe for Brendolyn Patty, MD.  Documentation: I have reviewed the above documentation for accuracy and completeness, and I agree with the above.  Brendolyn Patty MD

## 2022-02-14 ENCOUNTER — Ambulatory Visit: Payer: Medicare PPO | Admitting: Clinical

## 2022-02-14 ENCOUNTER — Ambulatory Visit: Payer: Medicare PPO | Admitting: Family Medicine

## 2022-02-14 DIAGNOSIS — M170 Bilateral primary osteoarthritis of knee: Secondary | ICD-10-CM

## 2022-02-15 ENCOUNTER — Telehealth: Payer: Self-pay | Admitting: Family Medicine

## 2022-02-15 NOTE — Telephone Encounter (Signed)
Pt called returning Shapale's missed call. Pt mentioned she went to pharmacy for meds & she spoked to the pharmacist about her issue with her hip. Pt stated the pharmacist took a look at the area & the pharmacist stated there was about 5 little cuts from dry skin from cracking so that's what caused the blood. Call back # [phone]

## 2022-02-15 NOTE — Telephone Encounter (Signed)
Left VM requesting pt to call the office back 

## 2022-02-15 NOTE — Telephone Encounter (Signed)
Pt notified of Dr. Tower's comments and verbalized understanding  

## 2022-02-15 NOTE — Telephone Encounter (Signed)
Keep clean with soap and water Dry well  Abx ointment is ok   Follow up if worse or no improvement in the next week

## 2022-02-15 NOTE — Telephone Encounter (Signed)
Pt called stating she experiencing some skin overlapping in her hip area. Pt stated when she was drying off from a shower, there was blood on her towel. Pt stated she has some ointment & was applying it in the area along with some bandaging but its still sore & red.Pt states she believes it might be caused by her underwear rubbing against the area too much. Pt is asking for advice on what to do. Call back # [phone]

## 2022-02-21 ENCOUNTER — Ambulatory Visit: Payer: Medicare PPO | Admitting: Family Medicine

## 2022-02-21 ENCOUNTER — Encounter: Payer: Self-pay | Admitting: Family Medicine

## 2022-02-21 VITALS — BP 108/68 | HR 77 | Temp 97.8°F | Ht 64.0 in | Wt 282.0 lb

## 2022-02-21 DIAGNOSIS — R253 Fasciculation: Secondary | ICD-10-CM

## 2022-02-21 DIAGNOSIS — R238 Other skin changes: Secondary | ICD-10-CM

## 2022-02-21 DIAGNOSIS — B372 Candidiasis of skin and nail: Secondary | ICD-10-CM

## 2022-02-21 DIAGNOSIS — E65 Localized adiposity: Secondary | ICD-10-CM

## 2022-02-21 DIAGNOSIS — Z1231 Encounter for screening mammogram for malignant neoplasm of breast: Secondary | ICD-10-CM | POA: Diagnosis not present

## 2022-02-21 DIAGNOSIS — N6489 Other specified disorders of breast: Secondary | ICD-10-CM | POA: Diagnosis not present

## 2022-02-21 NOTE — Progress Notes (Signed)
Subjective:    Patient ID: Mackenzie Bonilla, female    DOB: Dec 11, 1965, 57 y.o.   MRN: [id number]  HPI Pt presents for several concerns  Interested in neuro referral Interested in breast reduction Skin tear on leg /groin area  Check spot on abdomen    Wt Readings from Last 3 Encounters:  02/21/22 282 lb (127.9 kg)  01/03/22 280 lb (127 kg)  12/21/21 280 lb (127 kg)   48.41 kg/m  Per pt lower at home Continues to loose weight  On moujaro  Has help with meal planner     Vitals:   02/21/22 1043  BP: 108/68  Pulse: 77  Temp: 97.8 F (36.6 C)  TempSrc: Temporal  SpO2: 98%  Weight: 282 lb (127.9 kg)  Height: '5\' 4"'$  (1.626 m)     Wants a referral to neurology - dealing with a  Tic on the L corner of her mouth  Feels like she chews on that side of her mouth  As a history of facial tics  (not on anti psychotics currently) she did discuss with her psychiatrist  Mainly muscle twitches in face and scalp  Occ in ankle or hands -not bad at all  Occ some tingling in her fingers - ? Carpal tunnel  She discussed with her ENT  Also interested in plastic surgery referral for breasts and also skin fold (pannus)   Wants a breast reduction  Divot from bra strap Neck and head pain  Shoulder pain  Old surgery in R breast caused some discomfort and scarring -hard to get into a bra  That breast is much smaller than the other  Bra size is 44 triple D   Pannus/skin flap is chronically irritated  Lost wt  Gets intertrigo  It really bothers her esp on the left side   Sore area on R side today that is new   Uses zinc oxide/ barrier cream  Occ antibiotic ointment   Sea sorb- under her breasts     Last mammogram 12/2020  Needs to schedule    Interested in seeing Dr Sharmaine Base -given name by her dermatologist   Patient Active Problem List   Diagnosis Date Noted   Breast asymmetry 02/21/2022   Muscle twitch 02/21/2022   Abdominal pannus 02/21/2022   Skin  breakdown 02/21/2022   White blood cells present on urine microscopy 12/21/2021   Recurrent UTI 12/21/2021   Paresthesia 11/15/2021   Ingrown nail of great toe of left foot 11/15/2021   Keratosis 08/31/2021   Hair loss 06/19/2021   Urinary frequency 06/13/2021   Encounter for screening mammogram for breast cancer 12/13/2020   Fatigue 06/15/2020   Lumbar facet arthropathy 12/29/2019   Chronic bilateral thoracic back pain 12/08/2019   Lumbar degenerative disc disease 12/08/2019   Fibromyalgia 12/08/2019   Chronic pain syndrome 12/08/2019   Lumbar radicular pain (left S1) 12/08/2019   Thoracic radiculitis 09/29/2019   Mid back pain 02/11/2019   Multiple joint pain 11/12/2018   Gallstones 04/19/2017   Diabetic polyneuropathy associated with type 2 diabetes mellitus ([place]) 04/07/2017   Diabetic angiopathy ([place]) 04/07/2017   Vasomotor symptoms due to menopause 04/07/2017   Mobility impaired 11/12/2016   Urinary incontinence 12/02/2015   Candidal intertrigo 04/06/2015   Cystocele 04/06/2015   Constipation 04/06/2015   Myofascial pain syndrome of lumbar spine 12/28/2014   Hypertriglyceridemia 10/14/2014   Fall 07/23/2013   Uric acid kidney stone 11/20/2011   HYPERHIDROSIS 11/07/2009   Menopausal symptoms 06/17/2009  Type 2 diabetes mellitus without complication, without long-term current use of insulin ([place]) 08/23/2008   Vitamin D deficiency 05/07/2008   Vitamin B 12 deficiency 06/04/2007   CHRONIC FATIGUE SYNDROME 11/15/2006   DIVERTICULITIS, HX OF 11/15/2006   PCOS (polycystic ovarian syndrome) 10/16/2006   Hyperlipidemia associated with type 2 diabetes mellitus ([place]) 10/16/2006   Morbid obesity ([place]) 10/16/2006   Generalized anxiety disorder 10/16/2006   PANIC DISORDER 10/16/2006   BULIMIA 10/16/2006   Chronic depression 10/16/2006   CARPAL TUNNEL SYNDROME 10/16/2006   LYMPHEDEMA 10/16/2006   Allergic rhinitis 10/16/2006   Mild intermittent asthma 10/16/2006   TMJ  SYNDROME 10/16/2006   GERD 10/16/2006   IRRITABLE BOWEL SYNDROME 10/16/2006   Osteoarthritis 10/16/2006   COUGH, CHRONIC 10/16/2006   Past Medical History:  Diagnosis Date   Allergy    allergic rhinitis   Anemia    Anxiety    Arthritis    osteoarthritis   Asthma    Claustrophobia    does not like oxygen mask covering face   Depression    Diabetes mellitus without complication ([place])    Fall 2016   Fatty liver 07/2008   abd. ultrasound - fatty liver ; slt dilated cbd (no stones ) 06/10// abd.  ultrasound normal on 04/2006   Fibromyalgia    GERD (gastroesophageal reflux disease)    EGD negative 06/2001// EGD erythematous mucosa, polyp 08/2008   High uric acid in 24 hour urine specimen    Hyperhidrosis    Hyperlipidemia    Kidney stones    Lymphedema    Obesity    PCOS (polycystic ovarian syndrome)    Shortness of breath dyspnea    Tachycardia    TMJ (dislocation of temporomandibular joint)    UTI (lower urinary tract infection)    Past Surgical History:  Procedure Laterality Date   BREAST BIOPSY Right 2016   neg   BREAST LUMPECTOMY WITH RADIOACTIVE SEED LOCALIZATION Right 08/02/2014   Procedure: BREAST LUMPECTOMY WITH RADIOACTIVE SEED LOCALIZATION;  Surgeon: Rolm Bookbinder, MD;  Location: [facility];  Service: General;  Laterality: Right;   COLONOSCOPY  08/12/2012   Completed by Dr. Phylis Bougie, normal exam.    ENDOMETRIAL BIOPSY  01/2004   ESOPHAGOGASTRODUODENOSCOPY     ESOPHAGOGASTRODUODENOSCOPY (EGD) WITH PROPOFOL N/A 08/18/2019   Procedure: ESOPHAGOGASTRODUODENOSCOPY (EGD) WITH PROPOFOL;  Surgeon: Lucilla Lame, MD;  Location[facility] ENDOSCOPY;  Service: Endoscopy;  Laterality: N/A;   FOOT SURGERY     SEPTOPLASTY     two times   TONSILLECTOMY     UTERINE FIBROID SURGERY  06/2005   ablation   uterine tumor  09/2001   VAGUS NERVE STIMULATOR INSERTION     Social History   Tobacco Use   Smoking status: Never   Smokeless tobacco: Never  Vaping Use   Vaping Use: Never  used  Substance Use Topics   Alcohol use: No    Alcohol/week: 0.0 standard drinks of alcohol   Drug use: No   Family History  Problem Relation Age of Onset   Hypertension Father    Cancer Father        pancreatic cancer   Hypertension Sister    Heart disease Maternal Grandmother 8       MI   Diabetes Maternal Grandmother    Heart disease Paternal Grandmother 36       MI   Diabetes Paternal Grandmother    Breast cancer Neg Hx    Allergies  Allergen Reactions   Cephalexin  Codeine     rash   Duloxetine    Levaquin [Levofloxacin] Nausea Only   Lisinopril Cough   Metformin And Related Diarrhea    GI side effects    Quetiapine Other (See Comments)   Sertraline Hcl     REACTION: vivid dreams   Triazolam     REACTION: ? reaction   Zaleplon    Zocor [Simvastatin - High Dose]     achey   Zolpidem Tartrate     hallucinations   Zyrtec [Cetirizine]     Sedation    Hydrocodone Itching and Rash    REACTION: redness rash itching   Norelgestromin-Eth Estradiol     Patch didn't stick to skin   Current Outpatient Medications on File Prior to Visit  Medication Sig Dispense Refill   acetaminophen (TYLENOL) 500 MG tablet Take 500 mg by mouth every 6 (six) hours as needed.      albuterol (VENTOLIN HFA) 108 (90 Base) MCG/ACT inhaler INHALE 2 PUFFS INTO THE LUNGS EVERY 6 HOURS AS NEEDED FOR WHEEZING. 18 g 3   ALPRAZolam (XANAX) 1 MG tablet Take 2 mg by mouth 3 (three) times daily as needed.     Blood Glucose Monitoring Suppl (FIFTY50 GLUCOSE METER 2.0) w/Device KIT Use as directed     buPROPion (WELLBUTRIN XL) 150 MG 24 hr tablet Take 150 mg by mouth daily.     cyclobenzaprine (FLEXERIL) 5 MG tablet TAKE 1 TABLET BY MOUTH 3 TIMES DAILY AS NEEDED FOR MUSCLE SPASMS 90 tablet 3   desvenlafaxine (PRISTIQ) 100 MG 24 hr tablet Take 100 mg by mouth daily.     dimenhyDRINATE (DRAMAMINE) 50 MG tablet Take 50 mg by mouth every 8 (eight) hours as needed.     docusate sodium (COLACE) 100 MG  capsule Take 1 capsule (100 mg total) by mouth 2 (two) times daily. 180 capsule 3   fluticasone (FLONASE) 50 MCG/ACT nasal spray Place into both nostrils daily.     gabapentin (NEURONTIN) 100 MG capsule Take 1 capsule (100 mg total) by mouth 3 (three) times daily. (Patient taking differently: Take 100 mg by mouth in the morning.) 90 capsule 3   gabapentin (NEURONTIN) 300 MG capsule Take 1 capsule (300 mg total) by mouth at bedtime. 90 capsule 1   ibuprofen (ADVIL) 600 MG tablet Take 1 tablet (600 mg total) by mouth every 8 (eight) hours as needed. 30 tablet 0   Insulin Degludec (TRESIBA [place]) Inject into the skin. 1 injection nightly 8-10 pm     losartan (COZAAR) 25 MG tablet      minoxidil (LONITEN) 2.5 MG tablet Take 1 tablet (2.5 mg total) by mouth daily. 90 tablet 1   omeprazole (PRILOSEC) 20 MG capsule TAKE 1 CAPSULE BY MOUTH 2 TIMES DAILY. 180 capsule 3   oxybutynin (DITROPAN XL) 15 MG 24 hr tablet TAKE 1 TABLET BY MOUTH DAILY 90 tablet 1   Polyethyl Glycol-Propyl Glycol (SYSTANE OP) Apply 1 drop to eye daily as needed.     pravastatin (PRAVACHOL) 20 MG tablet 1 TABLET BY MOUTH AT BEDTIME. 90 tablet 3   Simethicone (GAS-X PO) Take by mouth as needed.     spironolactone (ALDACTONE) 100 MG tablet TAKE 1 TABLET BY MOUTH DAILY 90 tablet 1   tirzepatide (MOUNJARO) 7.5 MG/0.5ML Pen Inject 12.5 mg into the skin once a week.     traMADol (ULTRAM) 50 MG tablet TAKE ONE TABLET TWICE A DAY IF NEEDED FOR SEVERE PAIN 30 tablet 0   traZODone (  DESYREL) 150 MG tablet Take 300 mg by mouth at bedtime.      zinc oxide (BALMEX) 11.3 % CREA cream Apply 1 application topically 2 (two) times daily.     No current facility-administered medications on file prior to visit.    Patient Active Problem List   Diagnosis Date Noted   Breast asymmetry 02/21/2022   Muscle twitch 02/21/2022   Abdominal pannus 02/21/2022   Skin breakdown 02/21/2022   White blood cells present on urine microscopy 12/21/2021   Recurrent  UTI 12/21/2021   Paresthesia 11/15/2021   Ingrown nail of great toe of left foot 11/15/2021   Keratosis 08/31/2021   Hair loss 06/19/2021   Urinary frequency 06/13/2021   Encounter for screening mammogram for breast cancer 12/13/2020   Fatigue 06/15/2020   Lumbar facet arthropathy 12/29/2019   Chronic bilateral thoracic back pain 12/08/2019   Lumbar degenerative disc disease 12/08/2019   Fibromyalgia 12/08/2019   Chronic pain syndrome 12/08/2019   Lumbar radicular pain (left S1) 12/08/2019   Thoracic radiculitis 09/29/2019   Mid back pain 02/11/2019   Multiple joint pain 11/12/2018   Gallstones 04/19/2017   Diabetic polyneuropathy associated with type 2 diabetes mellitus ([place]) 04/07/2017   Diabetic angiopathy ([place]) 04/07/2017   Vasomotor symptoms due to menopause 04/07/2017   Mobility impaired 11/12/2016   Urinary incontinence 12/02/2015   Candidal intertrigo 04/06/2015   Cystocele 04/06/2015   Constipation 04/06/2015   Myofascial pain syndrome of lumbar spine 12/28/2014   Hypertriglyceridemia 10/14/2014   Fall 07/23/2013   Uric acid kidney stone 11/20/2011   HYPERHIDROSIS 11/07/2009   Menopausal symptoms 06/17/2009   Type 2 diabetes mellitus without complication, without long-term current use of insulin ([place]) 08/23/2008   Vitamin D deficiency 05/07/2008   Vitamin B 12 deficiency 06/04/2007   CHRONIC FATIGUE SYNDROME 11/15/2006   DIVERTICULITIS, HX OF 11/15/2006   PCOS (polycystic ovarian syndrome) 10/16/2006   Hyperlipidemia associated with type 2 diabetes mellitus ([place]) 10/16/2006   Morbid obesity ([place]) 10/16/2006   Generalized anxiety disorder 10/16/2006   PANIC DISORDER 10/16/2006   BULIMIA 10/16/2006   Chronic depression 10/16/2006   CARPAL TUNNEL SYNDROME 10/16/2006   LYMPHEDEMA 10/16/2006   Allergic rhinitis 10/16/2006   Mild intermittent asthma 10/16/2006   TMJ SYNDROME 10/16/2006   GERD 10/16/2006   IRRITABLE BOWEL SYNDROME 10/16/2006   Osteoarthritis  10/16/2006   COUGH, CHRONIC 10/16/2006   Past Medical History:  Diagnosis Date   Allergy    allergic rhinitis   Anemia    Anxiety    Arthritis    osteoarthritis   Asthma    Claustrophobia    does not like oxygen mask covering face   Depression    Diabetes mellitus without complication ([place])    Fall 2016   Fatty liver 07/2008   abd. ultrasound - fatty liver ; slt dilated cbd (no stones ) 06/10// abd.  ultrasound normal on 04/2006   Fibromyalgia    GERD (gastroesophageal reflux disease)    EGD negative 06/2001// EGD erythematous mucosa, polyp 08/2008   High uric acid in 24 hour urine specimen    Hyperhidrosis    Hyperlipidemia    Kidney stones    Lymphedema    Obesity    PCOS (polycystic ovarian syndrome)    Shortness of breath dyspnea    Tachycardia    TMJ (dislocation of temporomandibular joint)    UTI (lower urinary tract infection)    Past Surgical History:  Procedure Laterality Date   BREAST BIOPSY Right 2016  neg   BREAST LUMPECTOMY WITH RADIOACTIVE SEED LOCALIZATION Right 08/02/2014   Procedure: BREAST LUMPECTOMY WITH RADIOACTIVE SEED LOCALIZATION;  Surgeon: Rolm Bookbinder, MD;  Location: [place] [facility];  Service: General;  Laterality: Right;   COLONOSCOPY  08/12/2012   Completed by Dr. Phylis Bougie, normal exam.    ENDOMETRIAL BIOPSY  01/2004   ESOPHAGOGASTRODUODENOSCOPY     ESOPHAGOGASTRODUODENOSCOPY (EGD) WITH PROPOFOL N/A 08/18/2019   Procedure: ESOPHAGOGASTRODUODENOSCOPY (EGD) WITH PROPOFOL;  Surgeon: Lucilla Lame, MD;  Location: [facility] ENDOSCOPY;  Service: Endoscopy;  Laterality: N/A;   FOOT SURGERY     SEPTOPLASTY     two times   TONSILLECTOMY     UTERINE FIBROID SURGERY  06/2005   ablation   uterine tumor  09/2001   VAGUS NERVE STIMULATOR INSERTION     Social History   Tobacco Use   Smoking status: Never   Smokeless tobacco: Never  Vaping Use   Vaping Use: Never used  Substance Use Topics   Alcohol use: No    Alcohol/week: 0.0 standard drinks of alcohol    Drug use: No   Family History  Problem Relation Age of Onset   Hypertension Father    Cancer Father        pancreatic cancer   Hypertension Sister    Heart disease Maternal Grandmother 4       MI   Diabetes Maternal Grandmother    Heart disease Paternal Grandmother 12       MI   Diabetes Paternal Grandmother    Breast cancer Neg Hx    Allergies  Allergen Reactions   Cephalexin    Codeine     rash   Duloxetine    Levaquin [Levofloxacin] Nausea Only   Lisinopril Cough   Metformin And Related Diarrhea    GI side effects    Quetiapine Other (See Comments)   Sertraline Hcl     REACTION: vivid dreams   Triazolam     REACTION: ? reaction   Zaleplon    Zocor [Simvastatin - High Dose]     achey   Zolpidem Tartrate     hallucinations   Zyrtec [Cetirizine]     Sedation    Hydrocodone Itching and Rash    REACTION: redness rash itching   Norelgestromin-Eth Estradiol     Patch didn't stick to skin   Current Outpatient Medications on File Prior to Visit  Medication Sig Dispense Refill   acetaminophen (TYLENOL) 500 MG tablet Take 500 mg by mouth every 6 (six) hours as needed.      albuterol (VENTOLIN HFA) 108 (90 Base) MCG/ACT inhaler INHALE 2 PUFFS INTO THE LUNGS EVERY 6 HOURS AS NEEDED FOR WHEEZING. 18 g 3   ALPRAZolam (XANAX) 1 MG tablet Take 2 mg by mouth 3 (three) times daily as needed.     Blood Glucose Monitoring Suppl (FIFTY50 GLUCOSE METER 2.0) w/Device KIT Use as directed     buPROPion (WELLBUTRIN XL) 150 MG 24 hr tablet Take 150 mg by mouth daily.     cyclobenzaprine (FLEXERIL) 5 MG tablet TAKE 1 TABLET BY MOUTH 3 TIMES DAILY AS NEEDED FOR MUSCLE SPASMS 90 tablet 3   desvenlafaxine (PRISTIQ) 100 MG 24 hr tablet Take 100 mg by mouth daily.     dimenhyDRINATE (DRAMAMINE) 50 MG tablet Take 50 mg by mouth every 8 (eight) hours as needed.     docusate sodium (COLACE) 100 MG capsule Take 1 capsule (100 mg total) by mouth 2 (two) times daily. 180 capsule 3  fluticasone  (FLONASE) 50 MCG/ACT nasal spray Place into both nostrils daily.     gabapentin (NEURONTIN) 100 MG capsule Take 1 capsule (100 mg total) by mouth 3 (three) times daily. (Patient taking differently: Take 100 mg by mouth in the morning.) 90 capsule 3   gabapentin (NEURONTIN) 300 MG capsule Take 1 capsule (300 mg total) by mouth at bedtime. 90 capsule 1   ibuprofen (ADVIL) 600 MG tablet Take 1 tablet (600 mg total) by mouth every 8 (eight) hours as needed. 30 tablet 0   Insulin Degludec (TRESIBA [place]) Inject into the skin. 1 injection nightly 8-10 pm     losartan (COZAAR) 25 MG tablet      minoxidil (LONITEN) 2.5 MG tablet Take 1 tablet (2.5 mg total) by mouth daily. 90 tablet 1   omeprazole (PRILOSEC) 20 MG capsule TAKE 1 CAPSULE BY MOUTH 2 TIMES DAILY. 180 capsule 3   oxybutynin (DITROPAN XL) 15 MG 24 hr tablet TAKE 1 TABLET BY MOUTH DAILY 90 tablet 1   Polyethyl Glycol-Propyl Glycol (SYSTANE OP) Apply 1 drop to eye daily as needed.     pravastatin (PRAVACHOL) 20 MG tablet 1 TABLET BY MOUTH AT BEDTIME. 90 tablet 3   Simethicone (GAS-X PO) Take by mouth as needed.     spironolactone (ALDACTONE) 100 MG tablet TAKE 1 TABLET BY MOUTH DAILY 90 tablet 1   tirzepatide (MOUNJARO) 7.5 MG/0.5ML Pen Inject 12.5 mg into the skin once a week.     traMADol (ULTRAM) 50 MG tablet TAKE ONE TABLET TWICE A DAY IF NEEDED FOR SEVERE PAIN 30 tablet 0   traZODone (DESYREL) 150 MG tablet Take 300 mg by mouth at bedtime.      zinc oxide (BALMEX) 11.3 % CREA cream Apply 1 application topically 2 (two) times daily.     No current facility-administered medications on file prior to visit.      Review of Systems  Constitutional:  Positive for fatigue. Negative for activity change, appetite change, fever and unexpected weight change.  HENT:  Negative for congestion, ear pain, rhinorrhea, sinus pressure and sore throat.   Eyes:  Negative for pain, redness and visual disturbance.  Respiratory:  Negative for cough, shortness  of breath and wheezing.   Cardiovascular:  Negative for chest pain and palpitations.  Gastrointestinal:  Negative for abdominal pain, blood in stool, constipation and diarrhea.  Endocrine: Negative for polydipsia and polyuria.  Genitourinary:  Negative for dysuria, frequency and urgency.  Musculoskeletal:  Positive for arthralgias, back pain and myalgias.  Skin:  Positive for wound. Negative for pallor and rash.  Allergic/Immunologic: Negative for environmental allergies.  Neurological:  Negative for dizziness, syncope and headaches.       Tingling   Muscle twitches   Hematological:  Negative for adenopathy. Does not bruise/bleed easily.  Psychiatric/Behavioral:  Positive for dysphoric mood. Negative for decreased concentration. The patient is nervous/anxious.        Objective:   Physical Exam Constitutional:      General: She is not in acute distress.    Appearance: Normal appearance. She is obese. She is not ill-appearing or diaphoretic.  HENT:     Mouth/Throat:     Mouth: Mucous membranes are moist.  Neck:     Vascular: No carotid bruit.  Cardiovascular:     Rate and Rhythm: Normal rate and regular rhythm.  Pulmonary:     Effort: Pulmonary effort is normal. No respiratory distress.     Breath sounds: No wheezing.  Abdominal:  General: There is no distension.     Tenderness: There is no abdominal tenderness.  Genitourinary:    Comments: Breast exam: No mass, nodules, thickening, tenderness, bulging, retraction, inflamation, nipple discharge or skin changes noted.  No axillary or clavicular LA.    R breast is smaller  Dense tissue noted   Skin folds over lateral chest wall No rash today Musculoskeletal:     Cervical back: No tenderness.     Comments: Skin folds on lower legs baseline   No acute joint changes   Lymphadenopathy:     Cervical: No cervical adenopathy.  Skin:    General: Skin is warm and dry.     Coloration: Skin is not pale.     Findings: No  erythema or rash.     Comments: 2 dime size areas of skin brakdown under panus -lateral L and right  No erythema or drainage  Some thickened skin under breasts but no erythema today  Divots in shoulders noted   Neurological:     Mental Status: She is alert.     Cranial Nerves: No cranial nerve deficit.     Sensory: No sensory deficit.     Motor: No weakness.     Comments: No visible facial twitches noted   Nl grip   Psychiatric:        Mood and Affect: Mood is anxious.           Assessment & Plan:   Problem List Items Addressed This Visit       Musculoskeletal and Integument   Candidal intertrigo   Skin breakdown    2 very small areas under panus on far R and L Inst to keep area clean with soap and water Dry well Then abx oint to open areas Then barrier cream prn   Update if not starting to improve in a week or if worsening          Other   Abdominal pannus    With wt loss she struggles with more intertrigo issues and skin problems in general She is interested in plastic surgery referral to discuss poss surgery for this  Understands she will likely need to get closer to goal wt before action is taken  Ref done to Dr Marla Roe  Eval and tx   Will continue skin care and good hygiene       Breast asymmetry - Primary    Some change in breast size with planned wt loss  R one is smaller and she has more skin fold issues on that side laterally  Also intertrigo under breasts  Pain in shoulders / divots and back pain  DDD bra size Continues to work on wt loss   Wants to disc opt for tx with plastic surgery  Referral done for  Dr Marla Roe (her derm recommended this)        Encounter for screening mammogram for breast cancer    Due for mammogram Order done She will call to schedule at [facility]      Relevant Orders   MM 3D SCREEN BREAST BILATERAL   Muscle twitch    Pt has noticed this in face and scalp primarily  Occ hands /feet  Not on  antipsychotic currently (has d/w her psychiatrist also)   Wishes to have neuro referral in [place] but wants to wait until later to take care of other issues) No change in exam today  She will call for that when ready

## 2022-02-21 NOTE — Patient Instructions (Addendum)
I will do the referral to plastic surgery  Please call Dr Dillingham's office to schedule   For the open areas under your abdomen- keep clean gently  Use the antibiotic area first, then cover with the barrier cream   Let me know when you want me to put in a neurology appt in [place] care of yourself  Keep working on weight loss and health habits      Call the [facility] to schedule your mammogram   Please call the location of your choice from the menu below to schedule your Mammogram and/or Bone Density appointment.    [facility]                      Phone:  [phone], [place] [address]                                                             Services: Traditional and 3D Mammogram, [place] [facility]                 Phone: [phone] [address], [place] [address]    Service: Bone Density ONLY   *this site does NOT perform mammograms  [facility]                        Phone:  [phone] [address], [place] [address]                                            Services:  3D Mammogram and [place] at [facility]   Phone:  [phone]   [place], [place] [address]  Services: 3D Mammogram and Bone Density  [place] at [facility])  Phone:  [phone]   [address] Room [place], [place] [address]                                              Services:  3D Mammogram and Bone Density

## 2022-02-21 NOTE — Assessment & Plan Note (Signed)
With wt loss she struggles with more intertrigo issues and skin problems in general She is interested in plastic surgery referral to discuss poss surgery for this  Understands she will likely need to get closer to goal wt before action is taken  Ref done to Dr Vivi Martens and tx   Will continue skin care and good hygiene

## 2022-02-21 NOTE — Assessment & Plan Note (Signed)
Due for mammogram Order done She will call to schedule at [facility]

## 2022-02-21 NOTE — Assessment & Plan Note (Signed)
Pt has noticed this in face and scalp primarily  Occ hands /feet  Not on antipsychotic currently (has d/w her psychiatrist also)   Wishes to have neuro referral in [place] but wants to wait until later to take care of other issues) No change in exam today  She will call for that when ready

## 2022-02-21 NOTE — Assessment & Plan Note (Signed)
Some change in breast size with planned wt loss  R one is smaller and she has more skin fold issues on that side laterally  Also intertrigo under breasts  Pain in shoulders / divots and back pain  DDD bra size Continues to work on wt loss   Wants to disc opt for tx with plastic surgery  Referral done for  Dr Marla Roe (her derm recommended this)

## 2022-02-21 NOTE — Assessment & Plan Note (Signed)
2 very small areas under panus on far R and L Inst to keep area clean with soap and water Dry well Then abx oint to open areas Then barrier cream prn   Update if not starting to improve in a week or if worsening

## 2022-02-22 ENCOUNTER — Telehealth: Payer: Self-pay | Admitting: Family Medicine

## 2022-02-22 DIAGNOSIS — Z1231 Encounter for screening mammogram for malignant neoplasm of breast: Secondary | ICD-10-CM

## 2022-02-22 NOTE — Telephone Encounter (Signed)
Pt called in to make Dr. Glori Bickers aware that she was contacted and schedule for 05/29/22 plastic surgery but was told that referral was put in incorrectly .

## 2022-02-23 ENCOUNTER — Ambulatory Visit: Payer: Medicare PPO | Admitting: Family Medicine

## 2022-02-23 NOTE — Telephone Encounter (Signed)
Another message sent to Lennox Grumbles with [facility] about orders needed for this patient.   My thoughts are that they are wanting Diagnostic Mammogram and [place] but I am trying to confirm with them.   [place], you may want to contact them on Monday to see what they are wanting.  [place] [phone]

## 2022-02-23 NOTE — Telephone Encounter (Signed)
I called and spoke with [place] and they stated that the referral was put in correctly and that the patient is scheduled for 05/29/22. They are not sure where the mix up came from about the referral being incorrect.   I called the patient to discuss the referral. She states that her referral to Plastics was correct.    Patient states that she told the front desk that the order/referral that was incorrect was the order faxed/sent to [facility] for her Mammogram. Pt states that the woman she spoke with at [place] [facility] was very rude and short with her about scheduling her appointment and refused to schedule her because the order was incorrect. I explained to the patient based on what she was describing to me that they are likely wanting Diagnostic Mammogram with [place] orders placed given her medical Hx.   I have reached out to [facility] to see what orders they are needing

## 2022-02-23 NOTE — Telephone Encounter (Signed)
One of her breasts is larger than the other but this is baseline  for her and she had a diagnostic last time  Last mammogram report 11/22 noted next one could be screening   I will re order it stressing this is screening mammogram   Please let [place] know this is for screening

## 2022-03-02 NOTE — Telephone Encounter (Signed)
Patient is scheduled for MM in Feb  03/21/2022

## 2022-03-06 ENCOUNTER — Ambulatory Visit (INDEPENDENT_AMBULATORY_CARE_PROVIDER_SITE_OTHER): Payer: Medicare PPO | Admitting: Clinical

## 2022-03-06 DIAGNOSIS — F332 Major depressive disorder, recurrent severe without psychotic features: Secondary | ICD-10-CM

## 2022-03-06 DIAGNOSIS — F411 Generalized anxiety disorder: Secondary | ICD-10-CM | POA: Diagnosis not present
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
## 2022-03-06 NOTE — Progress Notes (Signed)
                Katherina Right, LCSW

## 2022-03-08 ENCOUNTER — Telehealth: Payer: Self-pay | Admitting: Family Medicine

## 2022-03-08 ENCOUNTER — Ambulatory Visit (INDEPENDENT_AMBULATORY_CARE_PROVIDER_SITE_OTHER): Payer: Medicare PPO | Admitting: Clinical

## 2022-03-08 DIAGNOSIS — F411 Generalized anxiety disorder: Secondary | ICD-10-CM | POA: Diagnosis not present

## 2022-03-08 DIAGNOSIS — F332 Major depressive disorder, recurrent severe without psychotic features: Secondary | ICD-10-CM

## 2022-03-08 DIAGNOSIS — R253 Fasciculation: Secondary | ICD-10-CM

## 2022-03-08 NOTE — Telephone Encounter (Signed)
Patient called in and stated that she is ready for the referral to the Neurologist to be sent in. She stated she will like somewhere in [place]. Thank you!

## 2022-03-08 NOTE — Progress Notes (Signed)
[place] Counselor/Therapist Progress Note  Patient ID: Mackenzie Bonilla, MRN: [id number],    Date: 03/08/2022  Time Spent: 1:31pm - 2: 31pm : 60 minutes  Treatment Type: Individual Therapy  Reported Symptoms: Patient reported feeling fatigue and "very blah" mood.   Mental Status Exam: Appearance:  Could not assess      Behavior: Could not assess  Motor: Could not assess  Speech/Language:  Clear and Coherent  Affect: Could not assess  Mood: "Very blah" per patient  Thought process: tangential  Thought content:   Tangential  Sensory/Perceptual disturbances:   WNL  Orientation: oriented to person, place, and situation  Attention: Good  Concentration: Good  Memory: WNL  Fund of knowledge:  Good  Insight:   Fair  Judgment:  Fair  Impulse Control: Poor   Risk Assessment: Danger to Self:  No Patient denied current suicidal ideation Self-injurious Behavior: No Danger to Others: No Patient denied current homicidal ideation Duty to Warn:no Physical Aggression / Violence:No  Access to Firearms a concern: No  Gang Involvement:No   Subjective: Patient reported feeling jittery and shaking during recent shopping trip and spent more money than she usually spends when shopping. Patient reported she has been more talkative than normal and would experience episodes of crying. Patient stated, "its like my body was just one big spasm". Patient stated, "its like I was really excited over something and I couldn't stop it". Patient reported she is currently in pain. Patient stated, "very blah" in response to current mood and reported feeling fatigue. Patient reported decreased hygiene today. Patient reported her heat has been repaired since last session. Patient reported she doesn't like the current thermostat and doesn't understand why she can not have the thermostat she prefers. Patient reported the sewer issues at her apartment have been resolved at this time. Patient reported  she is upset and concerned about the mail not being delivered to her home. Patient reported difficulty making a decision regarding scheduling an appointment with a neurologist and a Psychiatric nurse. Patient reported when experiencing multiple physical symptoms she makes appointments with various physicians in response to her concerns. Patient reported she discussed medical concerns with her primary care provider during previous visit.   Interventions: Cognitive Behavioral Therapy. Clinician conducted session via telephone from clinician's office at [facility] per patient's request. Patient provided verbal consent to proceed with telehealth session and participated in session from patient's home. Assessed recent changes in symptoms. Provided supportive therapy, active and reflective listening, and validation as patient discussed the outcome of stressors discussed during previous session and patient's response. Provided psycho education related to symptoms of anxiety and fluctuations in mood. Assisted patient with problem solving as it relates to patient's decisions regarding scheduling medical appointments. Discussed having a conversation with her primary care provider to discuss additional medical appointments, prioritizing medical concerns, and referral options.     Diagnosis:  Generalized anxiety disorder   Severe episode of recurrent major depressive disorder, without psychotic features ([place])      Plan: Patient is to utilize [employer] Therapy, thought re-framing, dialectical behavior therapy skills, and coping strategies to decrease symptoms associated with Generalized Anxiety Disorder and Major Depressive Disorder. Frequency: bi-weekly  Modality: individual      Long-term goal:   Reduce overall level, frequency, and intensity of the symptoms of depression and anxiety as evidenced by decreased difficulty making decisions, feeling anxious, staying at home, nightmares, difficulty  staying asleep, racing thoughts, worry about her finances, decreased concentration,  restlessness, excessive worry, lack of energy, and increased appetite from 6 to 7 days per week to 0 to 1 days per week per patient's report for at least 3 consecutive months.   Target Date: 12/07/22  Progress: progressing    Short-term goal:  Increase daily care of personal grooming and hygiene as evidenced by showering and getting dressed daily   Target Date: 06/07/22  Progress: progressing    Increase daily activities related to household chores, such as cleaning,  laundry, paying monthly bills Target Date: 06/07/22  Progress: progressing    Write at least one positive affirmation about herself daily   Target Date: 06/07/22  Progress: progressing    Learn and implement social skills to reduce depressive symptoms and build confidence in social interactions   Target Date: 06/07/22  Progress: progressing    Identify negative self talk used to reinforce low self esteem and replace with positive self talk   Target Date: 06/07/22  Progress: progressing    Increase participation in social opportunities from 0 days per week to 1 days per week   Target Date: 06/07/22  Progress: progressing    Decrease feelings of anger, depression, and anxiety by developing healthy coping mechanisms   Target Date: 06/07/22  Progress: progressing       Katherina Right, LCSW

## 2022-03-08 NOTE — Progress Notes (Signed)
                Katherina Right, LCSW

## 2022-03-09 ENCOUNTER — Encounter: Payer: Self-pay | Admitting: *Deleted

## 2022-03-09 NOTE — Addendum Note (Signed)
Addended by: Loura Pardon A on: 03/09/2022 09:56 AM   Modules accepted: Orders

## 2022-03-09 NOTE — Telephone Encounter (Signed)
Pt notified referral placed and to let us know if she hasn't received a call in 2 weeks

## 2022-03-09 NOTE — Telephone Encounter (Signed)
I sent the referral  I put the referral in  Please let us know if you don't hear in 1-2 weeks

## 2022-03-10 DIAGNOSIS — E119 Type 2 diabetes mellitus without complications: Secondary | ICD-10-CM | POA: Diagnosis not present

## 2022-03-12 ENCOUNTER — Encounter: Payer: Self-pay | Admitting: Plastic Surgery

## 2022-03-12 ENCOUNTER — Ambulatory Visit: Payer: Medicare PPO | Admitting: Plastic Surgery

## 2022-03-12 VITALS — BP 92/62 | HR 70 | Ht 64.0 in | Wt 272.0 lb

## 2022-03-12 DIAGNOSIS — M5416 Radiculopathy, lumbar region: Secondary | ICD-10-CM | POA: Diagnosis not present

## 2022-03-12 DIAGNOSIS — M5136 Other intervertebral disc degeneration, lumbar region: Secondary | ICD-10-CM

## 2022-03-12 DIAGNOSIS — F410 Panic disorder [episodic paroxysmal anxiety] without agoraphobia: Secondary | ICD-10-CM | POA: Diagnosis not present

## 2022-03-12 DIAGNOSIS — F502 Bulimia nervosa: Secondary | ICD-10-CM

## 2022-03-12 DIAGNOSIS — M549 Dorsalgia, unspecified: Secondary | ICD-10-CM

## 2022-03-12 DIAGNOSIS — N6489 Other specified disorders of breast: Secondary | ICD-10-CM

## 2022-03-12 DIAGNOSIS — F32A Depression, unspecified: Secondary | ICD-10-CM

## 2022-03-12 DIAGNOSIS — M797 Fibromyalgia: Secondary | ICD-10-CM

## 2022-03-12 DIAGNOSIS — G8929 Other chronic pain: Secondary | ICD-10-CM

## 2022-03-12 DIAGNOSIS — E65 Localized adiposity: Secondary | ICD-10-CM | POA: Diagnosis not present

## 2022-03-12 DIAGNOSIS — Z7409 Other reduced mobility: Secondary | ICD-10-CM

## 2022-03-12 DIAGNOSIS — G894 Chronic pain syndrome: Secondary | ICD-10-CM

## 2022-03-12 NOTE — Progress Notes (Signed)
Patient ID: Mackenzie Bonilla, female    DOB: Mar 12, 1965, 57 y.o.   MRN: [id number]   Chief Complaint  Patient presents with   Consult        Breast Problem   Skin Problem    The patient is a 57 year old female here for evaluation of her abdomen and her breasts.  She is 5 feet 4 inches tall weighs 272 pounds.  She was 330 pounds and through healthy eating and medication has been able to decrease it and sustained the weight at 272 pounds.  Her BMI is 46.7 kg/m. She complains of back pain and multiple rashes in her skin creases.  She has tried zinc and cream that only temporarily help.  She denies any surgery on her abdomen.  She is not a smoker.  She does have diabetes and her recent hemoglobin A1c was 5.7 in December.  Her bra size is likely a DDD.  She thinks she might want to be a B/C cup.  She is also interested in a breast reduction but the biggest issue is the excess skin in the axillary area.  She had a biopsy of the right breast for removal of what turned out to be a cyst and so no further issues with the breast.  She does have a little bit of volume loss in the right side compared to the left side.      Review of Systems  Constitutional:  Positive for activity change. Negative for appetite change.  HENT: Negative.    Eyes: Negative.   Respiratory: Negative.  Negative for chest tightness.   Cardiovascular: Negative.   Gastrointestinal: Negative.   Endocrine: Negative.   Genitourinary: Negative.   Musculoskeletal:  Positive for back pain and neck pain.  Skin:  Positive for rash.    Past Medical History:  Diagnosis Date   Allergy    allergic rhinitis   Anemia    Anxiety    Arthritis    osteoarthritis   Asthma    Claustrophobia    does not like oxygen mask covering face   Depression    Diabetes mellitus without complication ([place])    Fall 2016   Fatty liver 07/2008   abd. ultrasound - fatty liver ; slt dilated cbd (no stones ) 06/10// abd.  ultrasound normal on  04/2006   Fibromyalgia    GERD (gastroesophageal reflux disease)    EGD negative 06/2001// EGD erythematous mucosa, polyp 08/2008   High uric acid in 24 hour urine specimen    Hyperhidrosis    Hyperlipidemia    Kidney stones    Lymphedema    Obesity    PCOS (polycystic ovarian syndrome)    Shortness of breath dyspnea    Tachycardia    TMJ (dislocation of temporomandibular joint)    UTI (lower urinary tract infection)     Past Surgical History:  Procedure Laterality Date   BREAST BIOPSY Right 2016   neg   BREAST LUMPECTOMY WITH RADIOACTIVE SEED LOCALIZATION Right 08/02/2014   Procedure: BREAST LUMPECTOMY WITH RADIOACTIVE SEED LOCALIZATION;  Surgeon: Rolm Bookbinder, MD;  Location: [facility];  Service: General;  Laterality: Right;   COLONOSCOPY  08/12/2012   Completed by Dr. Phylis Bougie, normal exam.    ENDOMETRIAL BIOPSY  01/2004   ESOPHAGOGASTRODUODENOSCOPY     ESOPHAGOGASTRODUODENOSCOPY (EGD) WITH PROPOFOL N/A 08/18/2019   Procedure: ESOPHAGOGASTRODUODENOSCOPY (EGD) WITH PROPOFOL;  Surgeon: Lucilla Lame, MD;  Location: [facility] ENDOSCOPY;  Service: Endoscopy;  Laterality: N/A;   FOOT SURGERY  SEPTOPLASTY     two times   TONSILLECTOMY     UTERINE FIBROID SURGERY  06/2005   ablation   uterine tumor  09/2001   VAGUS NERVE STIMULATOR INSERTION        Current Outpatient Medications:    acetaminophen (TYLENOL) 500 MG tablet, Take 500 mg by mouth every 6 (six) hours as needed. , Disp: , Rfl:    albuterol (VENTOLIN HFA) 108 (90 Base) MCG/ACT inhaler, INHALE 2 PUFFS INTO THE LUNGS EVERY 6 HOURS AS NEEDED FOR WHEEZING., Disp: 18 g, Rfl: 3   ALPRAZolam (XANAX) 1 MG tablet, Take 2 mg by mouth 3 (three) times daily as needed., Disp: , Rfl:    Blood Glucose Monitoring Suppl (FIFTY50 GLUCOSE METER 2.0) w/Device KIT, Use as directed, Disp: , Rfl:    buPROPion (WELLBUTRIN XL) 150 MG 24 hr tablet, Take 150 mg by mouth daily., Disp: , Rfl:    cyclobenzaprine (FLEXERIL) 5 MG tablet, TAKE 1 TABLET BY  MOUTH 3 TIMES DAILY AS NEEDED FOR MUSCLE SPASMS, Disp: 90 tablet, Rfl: 3   desvenlafaxine (PRISTIQ) 100 MG 24 hr tablet, Take 100 mg by mouth daily., Disp: , Rfl:    dimenhyDRINATE (DRAMAMINE) 50 MG tablet, Take 50 mg by mouth every 8 (eight) hours as needed., Disp: , Rfl:    docusate sodium (COLACE) 100 MG capsule, Take 1 capsule (100 mg total) by mouth 2 (two) times daily., Disp: 180 capsule, Rfl: 3   fluticasone (FLONASE) 50 MCG/ACT nasal spray, Place into both nostrils daily., Disp: , Rfl:    gabapentin (NEURONTIN) 100 MG capsule, Take 1 capsule (100 mg total) by mouth 3 (three) times daily. (Patient taking differently: Take 100 mg by mouth in the morning.), Disp: 90 capsule, Rfl: 3   gabapentin (NEURONTIN) 300 MG capsule, Take 1 capsule (300 mg total) by mouth at bedtime., Disp: 90 capsule, Rfl: 1   ibuprofen (ADVIL) 600 MG tablet, Take 1 tablet (600 mg total) by mouth every 8 (eight) hours as needed., Disp: 30 tablet, Rfl: 0   Insulin Degludec (TRESIBA [place]), Inject into the skin. 1 injection nightly 8-10 pm, Disp: , Rfl:    losartan (COZAAR) 25 MG tablet, , Disp: , Rfl:    minoxidil (LONITEN) 2.5 MG tablet, Take 1 tablet (2.5 mg total) by mouth daily., Disp: 90 tablet, Rfl: 1   omeprazole (PRILOSEC) 20 MG capsule, TAKE 1 CAPSULE BY MOUTH 2 TIMES DAILY., Disp: 180 capsule, Rfl: 3   oxybutynin (DITROPAN XL) 15 MG 24 hr tablet, TAKE 1 TABLET BY MOUTH DAILY, Disp: 90 tablet, Rfl: 1   Polyethyl Glycol-Propyl Glycol (SYSTANE OP), Apply 1 drop to eye daily as needed., Disp: , Rfl:    pravastatin (PRAVACHOL) 20 MG tablet, 1 TABLET BY MOUTH AT BEDTIME., Disp: 90 tablet, Rfl: 3   Simethicone (GAS-X PO), Take by mouth as needed., Disp: , Rfl:    spironolactone (ALDACTONE) 100 MG tablet, TAKE 1 TABLET BY MOUTH DAILY, Disp: 90 tablet, Rfl: 1   tirzepatide (MOUNJARO) 7.5 MG/0.5ML Pen, Inject 12.5 mg into the skin once a week., Disp: , Rfl:    traMADol (ULTRAM) 50 MG tablet, TAKE ONE TABLET TWICE A DAY IF  NEEDED FOR SEVERE PAIN, Disp: 30 tablet, Rfl: 0   traZODone (DESYREL) 150 MG tablet, Take 300 mg by mouth at bedtime. , Disp: , Rfl:    zinc oxide (BALMEX) 11.3 % CREA cream, Apply 1 application topically 2 (two) times daily., Disp: , Rfl:    Objective:  Vitals:   03/12/22 1248  BP: 92/62  Pulse: 70  SpO2: 96%    Physical Exam Vitals and nursing note reviewed.  Constitutional:      Appearance: Normal appearance.  HENT:     Head: Normocephalic and atraumatic.  Cardiovascular:     Rate and Rhythm: Normal rate.     Pulses: Normal pulses.  Pulmonary:     Effort: Pulmonary effort is normal.  Abdominal:     General: There is no distension.     Palpations: Abdomen is soft.     Tenderness: There is no abdominal tenderness.  Musculoskeletal:        General: No swelling or deformity.  Skin:    General: Skin is warm.     Capillary Refill: Capillary refill takes less than 2 seconds.     Coloration: Skin is not jaundiced.     Findings: No bruising.  Neurological:     Mental Status: She is alert and oriented to person, place, and time.  Psychiatric:        Mood and Affect: Mood normal.        Behavior: Behavior normal.        Thought Content: Thought content normal.        Judgment: Judgment normal.     Assessment & Plan:  Abdominal pannus  Breast asymmetry  Lumbar radicular pain (left S1)  Chronic pain syndrome  Fibromyalgia  Chronic bilateral thoracic back pain  Mid back pain  Mobility impaired  PANIC DISORDER  BULIMIA  Chronic depression  Lumbar degenerative disc disease  Had a long talk with the patient regarding the potential surgeries and her potential risks which are quite high due to her size, her BMI and her diabetes.  I do not believe that I could do the surgery here and recommend a university setting.  I talked to her about gastric bypass and the healthy weight and wellness center.  She will think about it but wants to be able to have care closer to  home in [place].  I remain open to helping her how I can and wait to hear from her.  I do think a consult with general surgery for the possibility of gastric bypass would be a good option but she is going to think about it.  Pictures were obtained of the patient and placed in the chart with the patient's or guardian's permission.  [place], DO

## 2022-03-13 ENCOUNTER — Telehealth: Payer: Self-pay | Admitting: Plastic Surgery

## 2022-03-13 NOTE — Telephone Encounter (Signed)
Patient called to request provider delete photos from her chart. Patient also requesting photos be destroyed. Please advise pt [phone]

## 2022-03-13 NOTE — Telephone Encounter (Signed)
Called and spoke with patient directly, she was very pleasant to speak with.  I notified her that she should contact medical records at [facility] health, I provided her with the phone number listed online, I also provided her with the [facility] health medical records email also stated at the [facility] health website.  I offered any assistance that we would be able to provide if necessary.  Patient was thankful.  Recommend calling with any further questions or concerns.

## 2022-03-19 DIAGNOSIS — F959 Tic disorder, unspecified: Secondary | ICD-10-CM | POA: Diagnosis not present

## 2022-03-19 DIAGNOSIS — M62838 Other muscle spasm: Secondary | ICD-10-CM | POA: Diagnosis not present

## 2022-03-19 DIAGNOSIS — R519 Headache, unspecified: Secondary | ICD-10-CM | POA: Diagnosis not present

## 2022-03-20 NOTE — Progress Notes (Unsigned)
    Willowdean Luhmann T. Doron Shake, MD, [place] at [facility] [place], [address]  Phone: [phone]  FAX: [phone]  TAMANI DURNEY - 57 y.o. female  MRN [id number]  Date of Birth: Mar 01, 1965  Date: 03/21/2022  PCP: Abner Greenspan, MD  Referral: Abner Greenspan, MD  No chief complaint on file.  Subjective:   TERRE HANNEMAN is a 57 y.o. very pleasant female patient with There is no height or weight on file to calculate BMI. who presents with the following:  She is a patient that I recall quite well, though I have not seen her in approaching 4 years.  I have seen her for both right and left-sided knee osteoarthritis, she presents today with some acute on chronic right-sided knee pain.    Review of Systems is noted in the HPI, as appropriate  Objective:   There were no vitals taken for this visit.  GEN: No acute distress; alert,appropriate. PULM: Breathing comfortably in no respiratory distress PSYCH: Normally interactive.   Laboratory and Imaging Data:  Assessment and Plan:   ***

## 2022-03-21 ENCOUNTER — Encounter: Payer: Self-pay | Admitting: Family Medicine

## 2022-03-21 ENCOUNTER — Ambulatory Visit: Payer: Medicare PPO | Admitting: Family Medicine

## 2022-03-21 ENCOUNTER — Ambulatory Visit
Admission: RE | Admit: 2022-03-21 | Discharge: 2022-03-21 | Disposition: A | Discharge status: Home / Self Care | Payer: Medicare PPO | Source: Ambulatory Visit | Attending: Family Medicine | Admitting: Family Medicine

## 2022-03-21 ENCOUNTER — Ambulatory Visit (INDEPENDENT_AMBULATORY_CARE_PROVIDER_SITE_OTHER)
Admission: RE | Admit: 2022-03-21 | Discharge: 2022-03-21 | Disposition: A | Discharge status: Home / Self Care | Payer: Medicare PPO | Source: Ambulatory Visit | Attending: Family Medicine | Admitting: Family Medicine

## 2022-03-21 VITALS — BP 100/64 | HR 78 | Temp 97.1°F | Ht 64.0 in | Wt 274.1 lb

## 2022-03-21 DIAGNOSIS — M1711 Unilateral primary osteoarthritis, right knee: Secondary | ICD-10-CM | POA: Diagnosis not present

## 2022-03-21 DIAGNOSIS — M170 Bilateral primary osteoarthritis of knee: Secondary | ICD-10-CM | POA: Diagnosis not present

## 2022-03-21 DIAGNOSIS — Z1231 Encounter for screening mammogram for malignant neoplasm of breast: Secondary | ICD-10-CM | POA: Insufficient documentation

## 2022-03-21 DIAGNOSIS — G8929 Other chronic pain: Secondary | ICD-10-CM

## 2022-03-21 MED ORDER — TRIAMCINOLONE ACETONIDE 40 MG/ML IJ SUSP
40.0000 mg | Freq: Once | INTRAMUSCULAR | Status: AC
  Administered 2022-03-21: 40 mg via INTRA_ARTICULAR

## 2022-03-26 ENCOUNTER — Ambulatory Visit (INDEPENDENT_AMBULATORY_CARE_PROVIDER_SITE_OTHER): Payer: Medicare PPO | Admitting: Clinical

## 2022-03-26 DIAGNOSIS — F411 Generalized anxiety disorder: Secondary | ICD-10-CM | POA: Diagnosis not present

## 2022-03-26 DIAGNOSIS — F332 Major depressive disorder, recurrent severe without psychotic features: Secondary | ICD-10-CM

## 2022-03-26 NOTE — Progress Notes (Signed)
[facility] Counselor/Therapist Progress Note  Patient ID: TUMEKA ASELTINE, MRN: [id number],    Date: 03/26/2022  Time Spent: 12:32pm - 1:37pm : 65 minutes  Treatment Type: Individual Therapy  Reported Symptoms: Patient stated, "I'm ok, a little lethargic".   Mental Status Exam: Appearance:  Could not assess      Behavior: Could not assess  Motor: Could not assess  Speech/Language:  Clear and Coherent  Affect: Could not assess  Mood: normal  Thought process: tangential  Thought content:   Tangential  Sensory/Perceptual disturbances:   WNL  Orientation: oriented to person, place, and situation  Attention: Good  Concentration: Good  Memory: WNL  Fund of knowledge:  Good  Insight:   Fair  Judgment:  Fair  Impulse Control: Fair   Risk Assessment: Danger to Self:  No Patient denied current suicidal ideation Self-injurious Behavior: No Danger to Others: No Patient denied current homicidal ideation Duty to Warn:no Physical Aggression / Violence:No  Access to Firearms a concern: No  Gang Involvement:No   Subjective: Patient stated, "interesting" in response to events since last session. Patient reported recent appointment with cosmetic surgeon and stated, "it just made me feel so much worse about myself" in response to appointment with cosmetic surgeon. Patient reported the cosmetic surgeon indicated she would not be able to perform the surgery due to the extent of patient's needs. Patient reported feeling provider dismissed patient's questions during the appointment. Patient reported feeling the provider was "body shaming" patient during appointment. Patient reported she is willing to discuss her concerns regarding weight loss programs/options and cosmetic surgery providers with her primary care provider. Patient reported during a recent appointment with a neurologist, the term tardive dyskinesia was mentioned by the neurologist. Patient reported questions related  to tardive dyskinesia and reported concerns related to medication the neurologist prescribed for tardive dyskinesia.  Patient reported she called her psychiatrist to discuss her concerns related to the medication. Patient reported she experiences facial tics and has questions related to the cause of the tics. Patient reported she is willing to discuss her questions/concerns with psychiatrist.   Interventions: Cognitive Behavioral Therapy. Clinician conducted session via telephone from clinician's home office per patient's request. Patient provided verbal consent to proceed with telehealth session and participated in session from patient's home. Reviewed events since last session. Provided supportive therapy, active and reflective listening, and validation as patient discussed her thoughts/feelings in response to recent appointment with cosmetic surgeon and neurologist. Assisted patient with problem solving as it relates to patient's questions/concerns regarding options for weight loss and cosmetic surgery. Discussed patient having a conversation with her primary care provider to discuss patient's concerns related to weight loss programs/options and cosmetic surgery. Explored local resources to address patient's questions related to options for weight loss. Provided psycho education related to antipsychotic medications and tardive dyskinesia. Discussed patient having a conversation with psychiatrist to discuss her questions/concerns related to tardive dyskinesia and tic disorders.     Diagnosis:  Generalized anxiety disorder   Severe episode of recurrent major depressive disorder, without psychotic features ([place])      Plan: Patient is to utilize [facility], thought re-framing, dialectical behavior therapy skills, and coping strategies to decrease symptoms associated with Generalized Anxiety Disorder and Major Depressive Disorder. Frequency: bi-weekly  Modality: individual       Long-term goal:   Reduce overall level, frequency, and intensity of the symptoms of depression and anxiety as evidenced by decreased difficulty making decisions, feeling anxious, staying at  home, nightmares, difficulty staying asleep, racing thoughts, worry about her finances, decreased concentration, restlessness, excessive worry, lack of energy, and increased appetite from 6 to 7 days per week to 0 to 1 days per week per patient's report for at least 3 consecutive months.   Target Date: 12/07/22  Progress: progressing    Short-term goal:  Increase daily care of personal grooming and hygiene as evidenced by showering and getting dressed daily   Target Date: 06/07/22  Progress: progressing    Increase daily activities related to household chores, such as cleaning,  laundry, paying monthly bills Target Date: 06/07/22  Progress: progressing    Write at least one positive affirmation about herself daily   Target Date: 06/07/22  Progress: progressing    Learn and implement social skills to reduce depressive symptoms and build confidence in social interactions   Target Date: 06/07/22  Progress: progressing    Identify negative self talk used to reinforce low self esteem and replace with positive self talk   Target Date: 06/07/22  Progress: progressing    Increase participation in social opportunities from 0 days per week to 1 days per week   Target Date: 06/07/22  Progress: progressing    Decrease feelings of anger, depression, and anxiety by developing healthy coping mechanisms   Target Date: 06/07/22  Progress: progressing       Katherina Right, LCSW

## 2022-03-26 NOTE — Progress Notes (Signed)
                Montrice Montuori, LCSW 

## 2022-03-28 ENCOUNTER — Telehealth: Payer: Self-pay | Admitting: Family Medicine

## 2022-03-28 NOTE — Telephone Encounter (Signed)
Patient called in and stated she seen Dr. Lorelei Pont last week and received a shot in each knee. She stated that the L knee is doing better but the R knee is still giving her problems. She was wanting to know if there is anything in particular she need to be doing. Please advise. Thank you!

## 2022-03-29 NOTE — Telephone Encounter (Signed)
I just talked to [facility] on the phone.  She is still having trouble with her R knee.  I had the idea of using some Celebrex, but she does not really want to add any more medications right now.  She will try a combination of Voltaren gel, biofreeze, and icy hot for symptomatic relief.

## 2022-03-30 DIAGNOSIS — M9904 Segmental and somatic dysfunction of sacral region: Secondary | ICD-10-CM | POA: Diagnosis not present

## 2022-03-30 DIAGNOSIS — M5431 Sciatica, right side: Secondary | ICD-10-CM | POA: Diagnosis not present

## 2022-03-30 DIAGNOSIS — M9905 Segmental and somatic dysfunction of pelvic region: Secondary | ICD-10-CM | POA: Diagnosis not present

## 2022-03-30 DIAGNOSIS — M9903 Segmental and somatic dysfunction of lumbar region: Secondary | ICD-10-CM | POA: Diagnosis not present

## 2022-04-04 ENCOUNTER — Ambulatory Visit (INDEPENDENT_AMBULATORY_CARE_PROVIDER_SITE_OTHER): Payer: Medicare PPO | Admitting: Clinical

## 2022-04-04 DIAGNOSIS — F332 Major depressive disorder, recurrent severe without psychotic features: Secondary | ICD-10-CM | POA: Diagnosis not present

## 2022-04-04 DIAGNOSIS — F411 Generalized anxiety disorder: Secondary | ICD-10-CM

## 2022-04-04 NOTE — Progress Notes (Unsigned)
[facility] Counselor/Therapist Progress Note  Patient ID: Mackenzie Bonilla, MRN: [id number],    Date: 04/04/2022  Time Spent: 1:39pm - 2:38pm :  59 minutes  Treatment Type: Individual Therapy  Reported Symptoms: Patient reported difficulty staying asleep and reported anxious mood.   Mental Status Exam: Appearance:  Well Groomed     Behavior: Appropriate  Motor: Mannerisms  Speech/Language:  Clear and Coherent  Affect: Appropriate  Mood: anxious  Thought process: tangential  Thought content:   WNL and Tangential  Sensory/Perceptual disturbances:   WNL  Orientation: oriented to person, place, and situation  Attention: Good  Concentration: Good  Memory: WNL  Fund of knowledge:  Good  Insight:   Fair  Judgment:  Fair  Impulse Control: Fair   Risk Assessment: Danger to Self:  No Patient denied current suicidal ideation Self-injurious Behavior: No Danger to Others: No Patient denied current homicidal ideation Duty to Warn:no Physical Aggression / Violence:No  Access to Firearms a concern: No  Gang Involvement:No   Subjective: Patient reported difficulty staying asleep last night and stated, "I'm anxious" in response to current mood. Patient reported facial tics have become more frequent and painful. Patient reported experiencing headaches as a result of the tics. Patient reported experiencing hot flashes at night. Patient reported she continues to experience difficulty with the heat working properly in her apartment. Patient stated, "I'm tired of complaining" in response to issues with the heat in her apartment. Patient reported she has not spoken to the leasing office about the continued issues with her heat and plumbing. Patient reported she saw her psychiatrist on Saturday and reported psychiatrist indicated she does not feel patient has a diagnosis of tardive dyskinesia or Tourette's.  Patient reported she has an appointment with the neurologist tomorrow to  discuss her concerns regarding recent prescription and facial tics. Patient reported concern related to the medication (klonopin) prescribed by her neurologist.  Patient reported decline in memory. Patient stated, "it makes me mad that I can't remember all this stuff".  Patient reported she become nervous and forgets what she wants to say during appointments.   Interventions: Cognitive Behavioral Therapy. Clinician conducted session in person at clinician's office at [facility]. Assessed current symptoms and discussed patient's concerns related to facial tics. Reviewed events since last session, current stressors, and patient's response to stressors. Discussed patient's recent appointment with psychiatrist and patient's thoughts/feelings in response to the appointment. Discussed patient's concerns related to medication prescribed by neurologist. Provided psycho education related to psychotropic medications and discussed patient following up with her neurologist to discuss her concerns. Discussed upcoming appointment with neurologist and explored strategies for patient to initiate conversation regarding patient's concerns during upcoming appointment with neurologist. Discussed making a list of patient's concerns/issues she wants to review with neurologist in preparation for the appointment.    Diagnosis:  Generalized anxiety disorder   Severe episode of recurrent major depressive disorder, without psychotic features ([place])      Plan: Patient is to utilize [facility], thought re-framing, dialectical behavior therapy skills, and coping strategies to decrease symptoms associated with Generalized Anxiety Disorder and Major Depressive Disorder. Frequency: bi-weekly  Modality: individual      Long-term goal:   Reduce overall level, frequency, and intensity of the symptoms of depression and anxiety as evidenced by decreased difficulty making decisions, feeling anxious, staying at  home, nightmares, difficulty staying asleep, racing thoughts, worry about her finances, decreased concentration, restlessness, excessive worry, lack of energy, and increased appetite  from 6 to 7 days per week to 0 to 1 days per week per patient's report for at least 3 consecutive months.   Target Date: 12/07/22  Progress: progressing    Short-term goal:  Increase daily care of personal grooming and hygiene as evidenced by showering and getting dressed daily   Target Date: 06/07/22  Progress: progressing    Increase daily activities related to household chores, such as cleaning,  laundry, paying monthly bills Target Date: 06/07/22  Progress: progressing    Write at least one positive affirmation about herself daily   Target Date: 06/07/22  Progress: progressing    Learn and implement social skills to reduce depressive symptoms and build confidence in social interactions   Target Date: 06/07/22  Progress: progressing    Identify negative self talk used to reinforce low self esteem and replace with positive self talk   Target Date: 06/07/22  Progress: progressing    Increase participation in social opportunities from 0 days per week to 1 days per week   Target Date: 06/07/22  Progress: progressing    Decrease feelings of anger, depression, and anxiety by developing healthy coping mechanisms   Target Date: 06/07/22  Progress: progressing     Katherina Right, LCSW

## 2022-04-04 NOTE — Progress Notes (Unsigned)
                Analyce Tavares, LCSW 

## 2022-04-05 DIAGNOSIS — R519 Headache, unspecified: Secondary | ICD-10-CM | POA: Diagnosis not present

## 2022-04-05 DIAGNOSIS — F959 Tic disorder, unspecified: Secondary | ICD-10-CM | POA: Diagnosis not present

## 2022-04-05 DIAGNOSIS — M62838 Other muscle spasm: Secondary | ICD-10-CM | POA: Diagnosis not present

## 2022-04-10 ENCOUNTER — Ambulatory Visit: Payer: Medicare PPO | Admitting: Podiatry

## 2022-04-10 ENCOUNTER — Encounter: Payer: Self-pay | Admitting: Podiatry

## 2022-04-10 VITALS — BP 114/74 | HR 74

## 2022-04-10 DIAGNOSIS — M79675 Pain in left toe(s): Secondary | ICD-10-CM | POA: Diagnosis not present

## 2022-04-10 DIAGNOSIS — M79674 Pain in right toe(s): Secondary | ICD-10-CM

## 2022-04-10 DIAGNOSIS — B351 Tinea unguium: Secondary | ICD-10-CM | POA: Diagnosis not present

## 2022-04-10 NOTE — Progress Notes (Signed)
Chief Complaint  Patient presents with   Nail Problem    "Do my nails."    SUBJECTIVE Patient presents to office today complaining of elongated, thickened nails that cause pain while ambulating in shoes.  Patient is unable to trim their own nails. Patient is here for further evaluation and treatment.  Past Medical History:  Diagnosis Date   Allergy    allergic rhinitis   Anemia    Anxiety    Arthritis    osteoarthritis   Asthma    Claustrophobia    does not like oxygen mask covering face   Depression    Diabetes mellitus without complication ([place])    Fall 2016   Fatty liver 07/2008   abd. ultrasound - fatty liver ; slt dilated cbd (no stones ) 06/10// abd.  ultrasound normal on 04/2006   Fibromyalgia    GERD (gastroesophageal reflux disease)    EGD negative 06/2001// EGD erythematous mucosa, polyp 08/2008   High uric acid in 24 hour urine specimen    Hyperhidrosis    Hyperlipidemia    Kidney stones    Lymphedema    Obesity    PCOS (polycystic ovarian syndrome)    Shortness of breath dyspnea    Tachycardia    TMJ (dislocation of temporomandibular joint)    UTI (lower urinary tract infection)     Allergies  Allergen Reactions   Cephalexin    Codeine     rash   Duloxetine    Levaquin [Levofloxacin] Nausea Only   Lisinopril Cough   Metformin And Related Diarrhea    GI side effects    Quetiapine Other (See Comments)   Sertraline Hcl     REACTION: vivid dreams   Triazolam     REACTION: ? reaction   Zaleplon    Zocor [Simvastatin - High Dose]     achey   Zolpidem Tartrate     hallucinations   Zyrtec [Cetirizine]     Sedation    Hydrocodone Itching and Rash    REACTION: redness rash itching   Norelgestromin-Eth Estradiol     Patch didn't stick to skin     OBJECTIVE General Patient is awake, alert, and oriented x 3 and in no acute distress. Derm Skin is dry and supple bilateral. Negative open lesions or macerations. Remaining integument unremarkable.  Nails are tender, long, thickened and dystrophic with subungual debris, consistent with onychomycosis, 1-5 bilateral. No signs of infection noted. Vasc  DP and PT pedal pulses palpable bilaterally. Temperature gradient within normal limits.  Neuro Epicritic and protective threshold sensation grossly intact bilaterally.  Musculoskeletal Exam No symptomatic pedal deformities noted bilateral. Muscular strength within normal limits.  ASSESSMENT 1.  Pain due to onychomycosis of toenails both 2.  Diabetes mellitus  PLAN OF CARE 1. Patient evaluated today.  Comprehensive diabetic foot exam performed today 2. Instructed to maintain good pedal hygiene and foot care.  3. Mechanical debridement of nails 1-5 bilaterally performed using a nail nipper. Filed with dremel without incident.  4. Return to clinic in 3 mos.    Edrick Kins, DPM [facility]  Dr. Edrick Kins, DPM    [address] [place],  [address]  Office [phone]  Fax [phone]

## 2022-04-19 ENCOUNTER — Ambulatory Visit (INDEPENDENT_AMBULATORY_CARE_PROVIDER_SITE_OTHER): Payer: Medicare PPO | Admitting: Clinical

## 2022-04-19 DIAGNOSIS — F332 Major depressive disorder, recurrent severe without psychotic features: Secondary | ICD-10-CM | POA: Diagnosis not present

## 2022-04-19 DIAGNOSIS — F411 Generalized anxiety disorder: Secondary | ICD-10-CM

## 2022-04-19 NOTE — Progress Notes (Signed)
[facility] Counselor/Therapist Progress Note  Patient ID: KERLY WEARS, MRN: [id number],    Date: 04/19/2022  Time Spent: 1:32pm - 2:26pm : 54 minutes  Treatment Type: Individual Therapy  Reported Symptoms: Patient reported difficulty with short term memory and difficulty sleeping.   Mental Status Exam: Appearance:  Could not assess      Behavior: Could not assess  Motor: Could not assess  Speech/Language:  Clear and Coherent  Affect: Could not assess  Mood: Could not assess  Thought process: Patient stated, "I'm having trouble collecting my thoughts" during session.   Thought content:   WNL  Sensory/Perceptual disturbances:   WNL  Orientation: oriented to person, place, and situation  Attention: Good  Concentration: Good  Memory: Patient reported difficulty with short term memory  Fund of knowledge:  Good  Insight:   Fair  Judgment:  Fair  Impulse Control: Fair   Risk Assessment: Danger to Self:  No Patient denied current suicidal ideation  Self-injurious Behavior: No Danger to Others: No Patient denied current homicidal ideation  Duty to Warn:no Physical Aggression / Violence:No  Access to Firearms a concern: No  Gang Involvement:No   Subjective: Patient reported her neurologist prescribed patient klonopin and has tapered patient off of xanax to treat facial tics. Patient reported she feels facial tics have decreased in response to change in medication. Patent reported difficulty falling asleep and staying asleep recently. Patient reported experiencing fatigue. Patient reported she followed up with her neurologist to discuss change in medication and difficulty sleeping. Patient reported she has an appointment with the neurologist next week.  Patient stated, "I'm sorry, I'm having trouble collecting my thoughts" during session. Patient reported difficulty sleeping last night. Patient reported she fluctuates between sweating/feeling hot and feeling cold at  night. In addition, patient reported frequent urination at night. Patient reported she stops drinking liquids between 6-7pm. Patient reported caffeine intake in the morning and caffeine in the afternoon if she goes out to eat. Patient reported she goes to sleep around 10pm each evening. Patient reported she uses a sound machine at night. Patient her bedroom is near the breezeway at her apartment complex and she hears noises from outside of her apartment. Patient stated, "I put myself on a self induced time out" and has been staying at home in response to irritability and change in medication.   Interventions: Cognitive Behavioral Therapy. Clinician conducted session via telephone from clinician's office at [facility] per patient's request. Patient provided verbal consent to proceed with telehealth session and participated in session from patient's home. Assessed current symptoms. Discussed patient's concerns related to change in medication and difficulty sleeping. Discussed patient following up with neurologist and primary care physician to discuss recent symptoms of feeling hot/cold and frequent urination at night. Explored patient's sleep hygiene and discussed strategies to improve sleep hygiene. Provided psycho education related to healthy sleep hygiene.    Diagnosis:  Generalized anxiety disorder   Severe episode of recurrent major depressive disorder, without psychotic features ([place])      Plan: Patient is to utilize [facility] [facility], thought re-framing, dialectical behavior therapy skills, and coping strategies to decrease symptoms associated with Generalized Anxiety Disorder and Major Depressive Disorder. Frequency: bi-weekly  Modality: individual      Long-term goal:   Reduce overall level, frequency, and intensity of the symptoms of depression and anxiety as evidenced by decreased difficulty making decisions, feeling anxious, staying at home, nightmares, difficulty  staying asleep, racing thoughts, worry about her  finances, decreased concentration, restlessness, excessive worry, lack of energy, and increased appetite from 6 to 7 days per week to 0 to 1 days per week per patient's report for at least 3 consecutive months.   Target Date: 12/07/22  Progress: progressing    Short-term goal:  Increase daily care of personal grooming and hygiene as evidenced by showering and getting dressed daily   Target Date: 06/07/22  Progress: progressing    Increase daily activities related to household chores, such as cleaning,  laundry, paying monthly bills Target Date: 06/07/22  Progress: progressing    Write at least one positive affirmation about herself daily   Target Date: 06/07/22  Progress: progressing    Learn and implement social skills to reduce depressive symptoms and build confidence in social interactions   Target Date: 06/07/22  Progress: progressing    Identify negative self talk used to reinforce low self esteem and replace with positive self talk   Target Date: 06/07/22  Progress: progressing    Increase participation in social opportunities from 0 days per week to 1 days per week   Target Date: 06/07/22  Progress: progressing    Decrease feelings of anger, depression, and anxiety by developing healthy coping mechanisms   Target Date: 06/07/22  Progress: progressing     Katherina Right, LCSW

## 2022-04-19 NOTE — Progress Notes (Signed)
                Vasili Fok, LCSW 

## 2022-04-23 DIAGNOSIS — M62838 Other muscle spasm: Secondary | ICD-10-CM | POA: Diagnosis not present

## 2022-04-23 DIAGNOSIS — R519 Headache, unspecified: Secondary | ICD-10-CM | POA: Diagnosis not present

## 2022-04-23 DIAGNOSIS — G479 Sleep disorder, unspecified: Secondary | ICD-10-CM | POA: Diagnosis not present

## 2022-04-23 DIAGNOSIS — F959 Tic disorder, unspecified: Secondary | ICD-10-CM | POA: Diagnosis not present

## 2022-04-30 ENCOUNTER — Encounter: Payer: Self-pay | Admitting: Family Medicine

## 2022-04-30 ENCOUNTER — Ambulatory Visit: Payer: Medicare PPO | Admitting: Family Medicine

## 2022-04-30 VITALS — BP 118/72 | HR 77 | Temp 98.2°F | Ht 64.0 in | Wt 265.1 lb

## 2022-04-30 DIAGNOSIS — R0981 Nasal congestion: Secondary | ICD-10-CM | POA: Diagnosis not present

## 2022-04-30 DIAGNOSIS — N6489 Other specified disorders of breast: Secondary | ICD-10-CM | POA: Diagnosis not present

## 2022-04-30 DIAGNOSIS — N644 Mastodynia: Secondary | ICD-10-CM | POA: Diagnosis not present

## 2022-04-30 DIAGNOSIS — G2401 Drug induced subacute dyskinesia: Secondary | ICD-10-CM | POA: Insufficient documentation

## 2022-04-30 DIAGNOSIS — J309 Allergic rhinitis, unspecified: Secondary | ICD-10-CM

## 2022-04-30 NOTE — Assessment & Plan Note (Addendum)
Pt has had more congestion /more so in am in the setting of spring pollen Also noted she recently went up on trazodone to over 300 mg qhs which can cause nasal congestion as a side effect  Uses flonase daily  Occ mucinex Denies colored mucous/ pain or fever  Reassurng exam today  Enc her to f/u with ENT Also disc poss of trazodone side eff with neurology

## 2022-04-30 NOTE — Progress Notes (Signed)
Subjective:    Patient ID: Mackenzie Bonilla, female    DOB: [birth year], 57 y.o.   MRN: [id number]  HPI Pr presents for pain in R breast (this has improved)  Issues with intertrigo with weight loss-finding the right clothing to support  Also sinus symptoms   Wt Readings from Last 3 Encounters:  04/30/22 265 lb 2 oz (120.3 kg)  03/21/22 274 lb 2 oz (124.3 kg)  03/12/22 272 lb (123.4 kg)   45.51 kg/m  Vitals:   04/30/22 1419  BP: 118/72  Pulse: 77  Temp: 98.2 F (36.8 C)  SpO2: 97%   Odd pain under R breast  Around the side  Sharp/stinging  There was some redness     Mammogram was 03/2022- normal  Self exam  Bras fit differently since losing weight  Bought new one for more support  Breasts sit on stomach  Straps dig in shoulders still as well  Neck sometimes bothers her on that side  Used some bio freeze  Has ticks in her face- that causes more ear pain   Sees the chiropractor  New recliner-head rest is too high  Tried a C shaped neck pillow   New klonopin causes more tearfulness   Sinus problems  Saw her ENT recent -has f/u Thursday  Pain under r eye  Wakes up with nasal congestion - cannot get anything out  Fullness behind her eyes  Makes her dizzy at times   Did just go up on trazodone to 300 mg at night  Klonopin gives her trouble sleeping   No color to mucous today   Mucinex- dries her out some  Uses flonase  Cannot take sudafed- causes hear to race   Patient Active Problem List   Diagnosis Date Noted   Nasal congestion 04/30/2022   Tardive dyskinesia 04/30/2022   Breast asymmetry 02/21/2022   Muscle twitch 02/21/2022   Abdominal pannus 02/21/2022   Skin breakdown 02/21/2022   White blood cells present on urine microscopy 12/21/2021   Recurrent UTI 12/21/2021   Paresthesia 11/15/2021   Ingrown nail of great toe of left foot 11/15/2021   Keratosis 08/31/2021   Hair loss 06/19/2021   Urinary frequency 06/13/2021   Breast pain  12/20/2020   Encounter for screening mammogram for breast cancer 12/13/2020   Fatigue 06/15/2020   Lumbar facet arthropathy 12/29/2019   Chronic bilateral thoracic back pain 12/08/2019   Lumbar degenerative disc disease 12/08/2019   Fibromyalgia 12/08/2019   Chronic pain syndrome 12/08/2019   Lumbar radicular pain (left S1) 12/08/2019   Thoracic radiculitis 09/29/2019   Mid back pain 02/11/2019   Multiple joint pain 11/12/2018   Gallstones 04/19/2017   Diabetic polyneuropathy associated with type 2 diabetes mellitus ([place]) 04/07/2017   Diabetic angiopathy ([place]) 04/07/2017   Vasomotor symptoms due to menopause 04/07/2017   Mobility impaired 11/12/2016   Urinary incontinence 12/02/2015   Candidal intertrigo 04/06/2015   Cystocele 04/06/2015   Constipation 04/06/2015   Myofascial pain syndrome of lumbar spine 12/28/2014   Hypertriglyceridemia 10/14/2014   Fall 07/23/2013   Uric acid kidney stone 11/20/2011   HYPERHIDROSIS 11/07/2009   Menopausal symptoms 06/17/2009   Type 2 diabetes mellitus without complication, without long-term current use of insulin ([place]) 08/23/2008   Vitamin D deficiency 05/07/2008   Vitamin B 12 deficiency 06/04/2007   CHRONIC FATIGUE SYNDROME 11/15/2006   DIVERTICULITIS, HX OF 11/15/2006   PCOS (polycystic ovarian syndrome) 10/16/2006   Hyperlipidemia associated with type 2 diabetes mellitus ([place]) 10/16/2006  Morbid obesity ([place]) 10/16/2006   Generalized anxiety disorder 10/16/2006   PANIC DISORDER 10/16/2006   BULIMIA 10/16/2006   Chronic depression 10/16/2006   CARPAL TUNNEL SYNDROME 10/16/2006   LYMPHEDEMA 10/16/2006   Allergic rhinitis 10/16/2006   Mild intermittent asthma 10/16/2006   TMJ SYNDROME 10/16/2006   GERD 10/16/2006   IRRITABLE BOWEL SYNDROME 10/16/2006   Osteoarthritis 10/16/2006   COUGH, CHRONIC 10/16/2006   Past Medical History:  Diagnosis Date   Allergy    allergic rhinitis   Anemia    Anxiety    Arthritis     osteoarthritis   Asthma    Claustrophobia    does not like oxygen mask covering face   Depression    Diabetes mellitus without complication ([place])    Fall 2016   Fatty liver 07/2008   abd. ultrasound - fatty liver ; slt dilated cbd (no stones ) 06/10// abd.  ultrasound normal on 04/2006   Fibromyalgia    GERD (gastroesophageal reflux disease)    EGD negative 06/2001// EGD erythematous mucosa, polyp 08/2008   High uric acid in 24 hour urine specimen    Hyperhidrosis    Hyperlipidemia    Kidney stones    Lymphedema    Obesity    PCOS (polycystic ovarian syndrome)    Shortness of breath dyspnea    Tachycardia    TMJ (dislocation of temporomandibular joint)    UTI (lower urinary tract infection)    Past Surgical History:  Procedure Laterality Date   BREAST BIOPSY Right 2016   neg   BREAST LUMPECTOMY WITH RADIOACTIVE SEED LOCALIZATION Right 08/02/2014   Procedure: BREAST LUMPECTOMY WITH RADIOACTIVE SEED LOCALIZATION;  Surgeon: Rolm Bookbinder, MD;  Location: [facility];  Service: General;  Laterality: Right;   COLONOSCOPY  08/12/2012   Completed by Dr. Phylis Bougie, normal exam.    ENDOMETRIAL BIOPSY  01/2004   ESOPHAGOGASTRODUODENOSCOPY     ESOPHAGOGASTRODUODENOSCOPY (EGD) WITH PROPOFOL N/A 08/18/2019   Procedure: ESOPHAGOGASTRODUODENOSCOPY (EGD) WITH PROPOFOL;  Surgeon: Lucilla Lame, MD;  Location: [facility] ENDOSCOPY;  Service: Endoscopy;  Laterality: N/A;   FOOT SURGERY     SEPTOPLASTY     two times   TONSILLECTOMY     UTERINE FIBROID SURGERY  06/2005   ablation   uterine tumor  09/2001   VAGUS NERVE STIMULATOR INSERTION     Social History   Tobacco Use   Smoking status: Never   Smokeless tobacco: Never  Vaping Use   Vaping Use: Never used  Substance Use Topics   Alcohol use: No    Alcohol/week: 0.0 standard drinks of alcohol   Drug use: No   Family History  Problem Relation Age of Onset   Hypertension Father    Cancer Father        pancreatic cancer   Hypertension Sister     Heart disease Maternal Grandmother 28       MI   Diabetes Maternal Grandmother    Heart disease Paternal Grandmother 30       MI   Diabetes Paternal Grandmother    Breast cancer Neg Hx    Allergies  Allergen Reactions   Cephalexin    Codeine     rash   Duloxetine    Levaquin [Levofloxacin] Nausea Only   Lisinopril Cough   Metformin And Related Diarrhea    GI side effects    Quetiapine Other (See Comments)   Sertraline Hcl     REACTION: vivid dreams   Triazolam     REACTION: ?  reaction   Zaleplon    Zocor [Simvastatin - High Dose]     achey   Zolpidem Tartrate     hallucinations   Zyrtec [Cetirizine]     Sedation    Hydrocodone Itching and Rash    REACTION: redness rash itching   Norelgestromin-Eth Estradiol     Patch didn't stick to skin   Current Outpatient Medications on File Prior to Visit  Medication Sig Dispense Refill   acetaminophen (TYLENOL) 500 MG tablet Take 500 mg by mouth every 6 (six) hours as needed.      albuterol (VENTOLIN HFA) 108 (90 Base) MCG/ACT inhaler INHALE 2 PUFFS INTO THE LUNGS EVERY 6 HOURS AS NEEDED FOR WHEEZING. 18 g 3   Bacillus Coagulans-Inulin (PROBIOTIC-PREBIOTIC) 1-250 BILLION-MG CAPS Take 1 Capful by mouth daily.     Blood Glucose Monitoring Suppl (FIFTY50 GLUCOSE METER 2.0) w/Device KIT Use as directed     buPROPion (WELLBUTRIN XL) 150 MG 24 hr tablet Take 150 mg by mouth daily.     clonazePAM (KLONOPIN) 0.5 MG tablet Take 0.25 mg by mouth daily. Start .25 mg nightly for one week. Then increase to 0.50 mg, take two in the am and .25 mg at nightly.     cyclobenzaprine (FLEXERIL) 5 MG tablet TAKE 1 TABLET BY MOUTH 3 TIMES DAILY AS NEEDED FOR MUSCLE SPASMS 90 tablet 3   desvenlafaxine (PRISTIQ) 100 MG 24 hr tablet Take 100 mg by mouth daily.     dimenhyDRINATE (DRAMAMINE) 50 MG tablet Take 50 mg by mouth every 8 (eight) hours as needed.     docusate sodium (COLACE) 100 MG capsule Take 1 capsule (100 mg total) by mouth 2 (two) times  daily. 180 capsule 3   fluticasone (FLONASE) 50 MCG/ACT nasal spray Place into both nostrils daily.     gabapentin (NEURONTIN) 100 MG capsule Take 100 mg by mouth every morning.     gabapentin (NEURONTIN) 300 MG capsule Take 1 capsule (300 mg total) by mouth at bedtime. 90 capsule 1   losartan (COZAAR) 25 MG tablet      minoxidil (LONITEN) 2.5 MG tablet Take 1 tablet (2.5 mg total) by mouth daily. 90 tablet 1   MOUNJARO 12.5 MG/0.5ML Pen Inject 15 mg into the skin once a week.     omeprazole (PRILOSEC) 20 MG capsule TAKE 1 CAPSULE BY MOUTH 2 TIMES DAILY. 180 capsule 3   oxybutynin (DITROPAN XL) 15 MG 24 hr tablet TAKE 1 TABLET BY MOUTH DAILY 90 tablet 1   Polyethyl Glycol-Propyl Glycol (SYSTANE OP) Apply 1 drop to eye daily as needed.     pravastatin (PRAVACHOL) 20 MG tablet 1 TABLET BY MOUTH AT BEDTIME. 90 tablet 3   Simethicone (GAS-X PO) Take by mouth as needed.     spironolactone (ALDACTONE) 100 MG tablet TAKE 1 TABLET BY MOUTH DAILY 90 tablet 1   traMADol (ULTRAM) 50 MG tablet TAKE ONE TABLET TWICE A DAY IF NEEDED FOR SEVERE PAIN 30 tablet 0   traZODone (DESYREL) 150 MG tablet Take 350 mg by mouth at bedtime.     TRESIBA FLEXTOUCH 100 UNIT/ML FlexTouch Pen Inject 46 Units into the skin daily.     zinc oxide (BALMEX) 11.3 % CREA cream Apply 1 application topically 2 (two) times daily.     No current facility-administered medications on file prior to visit.     Review of Systems  Constitutional:  Positive for fatigue. Negative for activity change, appetite change, fever and unexpected weight change.  HENT:  Negative for congestion, ear pain, rhinorrhea, sinus pressure and sore throat.   Eyes:  Negative for pain, redness and visual disturbance.  Respiratory:  Negative for cough, shortness of breath and wheezing.   Cardiovascular:  Negative for chest pain and palpitations.  Gastrointestinal:  Negative for abdominal pain, blood in stool, constipation and diarrhea.  Endocrine: Negative for  polydipsia and polyuria.  Genitourinary:  Negative for dysuria, frequency and urgency.       Pain in lateral R breast area   Musculoskeletal:  Negative for arthralgias, back pain and myalgias.  Skin:  Negative for pallor and rash.  Allergic/Immunologic: Negative for environmental allergies.  Neurological:  Negative for dizziness, syncope and headaches.       Some involuntary movements, TD  Hematological:  Negative for adenopathy. Does not bruise/bleed easily.  Psychiatric/Behavioral:  Positive for dysphoric mood. Negative for decreased concentration. The patient is nervous/anxious.        Objective:   Physical Exam Constitutional:      General: She is not in acute distress.    Appearance: Normal appearance. She is well-developed. She is obese. She is not ill-appearing or diaphoretic.  HENT:     Head: Normocephalic and atraumatic.     Nose:     Comments: Boggy nares Mildly congested  Worse rhinorrhea with crying today   No sinus tenderness  Some R TMJ area tenderness possibly from clenching jaw     Mouth/Throat:     Mouth: Mucous membranes are moist.  Eyes:     Conjunctiva/sclera: Conjunctivae normal.     Pupils: Pupils are equal, round, and reactive to light.  Neck:     Thyroid: No thyromegaly.     Vascular: No carotid bruit or JVD.  Cardiovascular:     Rate and Rhythm: Normal rate and regular rhythm.     Heart sounds: Normal heart sounds.     No gallop.  Pulmonary:     Effort: Pulmonary effort is normal. No respiratory distress.     Breath sounds: Normal breath sounds. No wheezing or rales.  Chest:     Chest wall: No tenderness.  Abdominal:     General: There is no distension or abdominal bruit.     Palpations: Abdomen is soft. There is no mass.     Tenderness: There is no abdominal tenderness.  Genitourinary:    Comments: Breast exam: No mass, nodules, thickening, tenderness, bulging, retraction, inflamation, nipple discharge or skin changes noted.  No axillary or  clavicular LA.    Redundant skin noted laterally from recent weight loss   Some tenderness in R skin flap area- lateral to the breast itself  No skin redness or rash today    Musculoskeletal:     Cervical back: Normal range of motion and neck supple. No rigidity or tenderness.     Right lower leg: No edema.     Left lower leg: No edema.  Lymphadenopathy:     Cervical: No cervical adenopathy.  Skin:    General: Skin is warm and dry.     Coloration: Skin is not pale.     Findings: No rash.  Neurological:     Mental Status: She is alert.     Coordination: Coordination normal.     Deep Tendon Reflexes: Reflexes are normal and symmetric. Reflexes normal.  Psychiatric:        Attention and Perception: Attention normal.        Mood and Affect: Mood is anxious and depressed. Affect  is tearful.        Behavior: Behavior normal.     Comments: Tearful on/off during interview            Assessment & Plan:   Problem List Items Addressed This Visit       Respiratory   Allergic rhinitis    Pt has had more congestion /more so in am in the setting of spring pollen Also noted she recently went up on trazodone to over 300 mg qhs which can cause nasal congestion as a side effect  Uses flonase daily  Occ mucinex Denies colored mucous/ pain or fever  Reassurng exam today  Enc her to f/u with ENT Also disc poss of trazodone side eff with neurology        Nervous and Auditory   Tardive dyskinesia    Seeing Dr Melrose Nakayama from neurology  Has been on multiple psychiatric medicines in past   Recently inc trazodone to over 300 mg qhs Also working with [employer] [facility] psychiatric care with Dr Lajoyce Corners        Other   Breast pain - Primary    Recent nl mammogram 03/2022- rev with pt No lumps on exam   Area of discomfort is on R side - where a fatty ridge is created laterally where breast tissue ends This may be more msk pain and/or intertrigo Adv her to keep looking for a bra  that supports that area w/o being too tight  This will be a challenge as she looses wt  She did consult plastic surgeon but found to be too high of a surgical risk currently        Morbid obesity ([place])    Discussed how this problem influences overall health and the risks it imposes  Reviewed plan for weight loss with lower calorie diet (via better food choices and also portion control or program like weight watchers) and exercise building up to or more than 30 minutes 5 days per week including some aerobic activity  Continues wt loss with help of moujaro 15 mg weekly        Nasal congestion    Did recently inc trazodone to 300 mg at bedtime ? If an uncommon side effect  Could also be allergic Doubt bacterial infection   Will f/u with her ENT later this week

## 2022-04-30 NOTE — Assessment & Plan Note (Signed)
Recent nl mammogram 03/2022- rev with pt No lumps on exam   Area of discomfort is on R side - where a fatty ridge is created laterally where breast tissue ends This may be more msk pain and/or intertrigo Adv her to keep looking for a bra that supports that area w/o being too tight  This will be a challenge as she looses wt  She did consult plastic surgeon but found to be too high of a surgical risk currently

## 2022-04-30 NOTE — Patient Instructions (Signed)
Cosider compression shorts as an option for comfort as underwear   You may have to look on line  Wear the most supportive bra you can that is comfortable  I think the breast tissue is pulling on your chest wall    For allergies See the ENT  Continue flonase   Try claritin 10 mg daily for mucous production (over the counter)   First think in the am when you feel really dry try nasal saline spray   Avoid allergens when you can  A vaporizer in your bedroom may help also

## 2022-04-30 NOTE — Assessment & Plan Note (Signed)
Discussed how this problem influences overall health and the risks it imposes  Reviewed plan for weight loss with lower calorie diet (via better food choices and also portion control or program like weight watchers) and exercise building up to or more than 30 minutes 5 days per week including some aerobic activity  Continues wt loss with help of moujaro 15 mg weekly

## 2022-04-30 NOTE — Assessment & Plan Note (Signed)
Continues to struggle with discomfort from skin/fat overlap on R lateral chest wall

## 2022-04-30 NOTE — Assessment & Plan Note (Addendum)
Seeing Dr Melrose Nakayama from neurology  Reviewed recent notes  Has been on multiple psychiatric medicines in past   Recently inc trazodone to over 300 mg qhs Also working with [employer] [facility] psychiatric care with Dr Lajoyce Corners

## 2022-04-30 NOTE — Assessment & Plan Note (Addendum)
Did recently inc trazodone to 300 mg at bedtime ? If an uncommon side effect  Could also be allergic Doubt bacterial infection   Will f/u with her ENT later this week

## 2022-05-01 ENCOUNTER — Ambulatory Visit: Payer: Medicare PPO | Admitting: Podiatry

## 2022-05-01 ENCOUNTER — Encounter: Payer: Self-pay | Admitting: Podiatry

## 2022-05-01 ENCOUNTER — Ambulatory Visit (INDEPENDENT_AMBULATORY_CARE_PROVIDER_SITE_OTHER): Payer: Medicare PPO | Admitting: Clinical

## 2022-05-01 VITALS — BP 114/69 | HR 80

## 2022-05-01 DIAGNOSIS — M778 Other enthesopathies, not elsewhere classified: Secondary | ICD-10-CM | POA: Diagnosis not present

## 2022-05-01 DIAGNOSIS — F411 Generalized anxiety disorder: Secondary | ICD-10-CM

## 2022-05-01 DIAGNOSIS — F332 Major depressive disorder, recurrent severe without psychotic features: Secondary | ICD-10-CM | POA: Diagnosis not present

## 2022-05-01 MED ORDER — BETAMETHASONE SOD PHOS & ACET 6 (3-3) MG/ML IJ SUSP
3.0000 mg | Freq: Once | INTRAMUSCULAR | Status: AC
  Administered 2022-05-01: 3 mg via INTRA_ARTICULAR

## 2022-05-01 NOTE — Progress Notes (Signed)
Chief Complaint  Patient presents with   Foot Pain    "My right foot hurts down the middle of this right foot and it hurts when I bend my toes down." (Refused x-ray) N - pain on top of my foot and toes L - right 1/2 met dorsal, 1-5 toes D - 2 weeks O - suddenly, progressively worse C - tender, feels bruised A - shoes, pressure, bend toes down T - put on different shoes    HPI: 57 y.o. female presenting today for evaluation of pain and tenderness associated to the dorsum of the right foot that has been ongoing for a few weeks now.  Sudden onset.  Idiopathic onset.  She denies a history of injury.  She believes that possibly a pair of her shoes was too tight.  She feels tightness and pressure when she tries to bend her toes.  Past Medical History:  Diagnosis Date   Allergy    allergic rhinitis   Anemia    Anxiety    Arthritis    osteoarthritis   Asthma    Claustrophobia    does not like oxygen mask covering face   Depression    Diabetes mellitus without complication ([place])    Fall 2016   Fatty liver 07/2008   abd. ultrasound - fatty liver ; slt dilated cbd (no stones ) 06/10// abd.  ultrasound normal on 04/2006   Fibromyalgia    GERD (gastroesophageal reflux disease)    EGD negative 06/2001// EGD erythematous mucosa, polyp 08/2008   High uric acid in 24 hour urine specimen    Hyperhidrosis    Hyperlipidemia    Kidney stones    Lymphedema    Obesity    PCOS (polycystic ovarian syndrome)    Shortness of breath dyspnea    Tachycardia    TMJ (dislocation of temporomandibular joint)    UTI (lower urinary tract infection)     Past Surgical History:  Procedure Laterality Date   BREAST BIOPSY Right 2016   neg   BREAST LUMPECTOMY WITH RADIOACTIVE SEED LOCALIZATION Right 08/02/2014   Procedure: BREAST LUMPECTOMY WITH RADIOACTIVE SEED LOCALIZATION;  Surgeon: Rolm Bookbinder, MD;  Location: [facility];  Service: General;  Laterality: Right;   COLONOSCOPY  08/12/2012   Completed  by Dr. Phylis Bougie, normal exam.    ENDOMETRIAL BIOPSY  01/2004   ESOPHAGOGASTRODUODENOSCOPY     ESOPHAGOGASTRODUODENOSCOPY (EGD) WITH PROPOFOL N/A 08/18/2019   Procedure: ESOPHAGOGASTRODUODENOSCOPY (EGD) WITH PROPOFOL;  Surgeon: Lucilla Lame, MD;  Location: [facility] ENDOSCOPY;  Service: Endoscopy;  Laterality: N/A;   FOOT SURGERY     SEPTOPLASTY     two times   TONSILLECTOMY     UTERINE FIBROID SURGERY  06/2005   ablation   uterine tumor  09/2001   VAGUS NERVE STIMULATOR INSERTION      Allergies  Allergen Reactions   Cephalexin    Codeine     rash   Duloxetine    Levaquin [Levofloxacin] Nausea Only   Lisinopril Cough   Metformin And Related Diarrhea    GI side effects    Quetiapine Other (See Comments)   Sertraline Hcl     REACTION: vivid dreams   Triazolam     REACTION: ? reaction   Zaleplon    Zocor [Simvastatin - High Dose]     achey   Zolpidem Tartrate     hallucinations   Zyrtec [Cetirizine]     Sedation    Hydrocodone Itching and Rash    REACTION: redness  rash itching   Norelgestromin-Eth Estradiol     Patch didn't stick to skin     Physical Exam: General: The patient is alert and oriented x3 in no acute distress.  Dermatology: Skin is warm, dry and supple bilateral lower extremities. Negative for open lesions or macerations.  Vascular: Chronic lower extremity edema noted bilateral lower extremities  Neurological: Grossly intact via light touch  Musculoskeletal Exam: No pedal deformities noted.  There is tenderness with palpation throughout the dorsum of the right foot along the soft tissue.  Radiographic exam RT foot 05/01/2022: Patient declines due to no history of injury.  Assessment: 1.  Capsulitis right foot  -Patient evaluated. -Injection of 0.5 cc Celestone Soluspan injected into the dorsum of the right foot -Continue [place] shoes that are loosely laced so as not to constrict the dorsum of the foot -Return to clinic next scheduled appointment for routine  footcare      Edrick Kins, DPM [facility]  Dr. Edrick Kins, DPM    [address], [place] [address]                Office [phone]  Fax [phone]

## 2022-05-01 NOTE — Progress Notes (Signed)
                Mackenzie Vondra, LCSW 

## 2022-05-01 NOTE — Progress Notes (Signed)
[place] Counselor/Therapist Progress Note  Patient ID: Mackenzie Bonilla, MRN: [id number],    Date: 05/01/2022  Time Spent: 12:33pm - 1:38pm : 65 minutes  Treatment Type: Individual Therapy  Reported Symptoms: Patient was tearful during session.   Mental Status Exam: Appearance:  Well Groomed     Behavior: Appropriate  Motor: Normal  Speech/Language:  Clear and Coherent  Affect: Tearful  Mood: depressed  Thought process: normal  Thought content:   WNL  Sensory/Perceptual disturbances:   WNL  Orientation: oriented to person, place, and situation  Attention: Good  Concentration: Good  Memory: WNL  Fund of knowledge:  Good  Insight:   Fair  Judgment:  Fair  Impulse Control: Fair   Risk Assessment: Danger to Self:  No Patient denied current suicidal ideation  Self-injurious Behavior: No Danger to Others: No Patient denied current homicidal ideation  Duty to Warn:no Physical Aggression / Violence:No  Access to Firearms a concern: No  Gang Involvement:No   Subjective: Patient reported she is unable to use her cell phone for virtual visits. Patient reported she contacted her insurance provider and was advised her insurance covers telephonic visits. Patient reported she is upset that she is unable to schedule telephonic visits. Patient reported an increase in crying since she started taking klonopin. Patient reported she is considering changing psychiatrist and reported concern that a new provider might not continue patient's current medications. Patient reported concerns related to medications that have been recommended by patient's providers and side effects of medications. Patient stated, "I feel so out of control of everything". Patient reported feeling out of control as it relates to making a decision regarding changing psychiatrist, pain in her neck, changes in patient's body due to weight loss, and experiencing pain in her foot. Patient reported she feels  rushed during appointments with the psychiatrist due to time limitations. During session, patient identified pros/cons related to making a decision regarding a psychiatrist. Patient reported difficulty making a decision about changing psychiatrist and stated, "I feel relieved" after identifying the pros/cons.   Interventions: Cognitive Behavioral Therapy and problem solving . Clinician conducted session in person at clinician's office at [facility]. Reviewed events since last session. Discussed patient's thoughts/feelings associated with recent change in insurance coverage of telephonic visits. Provided psycho education related to coverage for visits. Patient inquired about psychotropic medications  and clinician provided psycho education related to psychotropic medications. Discussed patient's concerns related to medications recently recommended by patient's providers. Discussed patient's statement, "I feel so out of control of everything" and identified triggers for patient feeling out of control. Explored patient's thoughts related to changing psychiatrist and explored/identified pros/cons related to patient's decision to change psychiatrist or remain with current psychiatrist.    Diagnosis:  Generalized anxiety disorder   Severe episode of recurrent major depressive disorder, without psychotic features ([facility])      Plan: Patient is to utilize Cognitive Behavioral Therapy, thought re-framing, dialectical behavior therapy skills, and coping strategies to decrease symptoms associated with Generalized Anxiety Disorder and Major Depressive Disorder. Frequency: bi-weekly  Modality: individual      Long-term goal:   Reduce overall level, frequency, and intensity of the symptoms of depression and anxiety as evidenced by decreased difficulty making decisions, feeling anxious, staying at home, nightmares, difficulty staying asleep, racing thoughts, worry about her finances, decreased concentration,  restlessness, excessive worry, lack of energy, and increased appetite from 6 to 7 days per week to 0 to 1 days per week per patient's report  for at least 3 consecutive months.   Target Date: 12/07/22  Progress: progressing    Short-term goal:  Increase daily care of personal grooming and hygiene as evidenced by showering and getting dressed daily   Target Date: 06/07/22  Progress: progressing    Increase daily activities related to household chores, such as cleaning,  laundry, paying monthly bills Target Date: 06/07/22  Progress: progressing    Write at least one positive affirmation about herself daily   Target Date: 06/07/22  Progress: progressing    Learn and implement social skills to reduce depressive symptoms and build confidence in social interactions   Target Date: 06/07/22  Progress: progressing    Identify negative self talk used to reinforce low self esteem and replace with positive self talk   Target Date: 06/07/22  Progress: progressing    Increase participation in social opportunities from 0 days per week to 1 days per week   Target Date: 06/07/22  Progress: progressing    Decrease feelings of anger, depression, and anxiety by developing healthy coping mechanisms   Target Date: 06/07/22  Progress: progressing     Katherina Right, LCSW

## 2022-05-03 DIAGNOSIS — H902 Conductive hearing loss, unspecified: Secondary | ICD-10-CM | POA: Diagnosis not present

## 2022-05-03 DIAGNOSIS — H6123 Impacted cerumen, bilateral: Secondary | ICD-10-CM | POA: Diagnosis not present

## 2022-05-03 DIAGNOSIS — G479 Sleep disorder, unspecified: Secondary | ICD-10-CM | POA: Diagnosis not present

## 2022-05-03 DIAGNOSIS — G4700 Insomnia, unspecified: Secondary | ICD-10-CM | POA: Diagnosis not present

## 2022-05-03 DIAGNOSIS — M62838 Other muscle spasm: Secondary | ICD-10-CM | POA: Diagnosis not present

## 2022-05-03 DIAGNOSIS — F959 Tic disorder, unspecified: Secondary | ICD-10-CM | POA: Diagnosis not present

## 2022-05-03 DIAGNOSIS — R519 Headache, unspecified: Secondary | ICD-10-CM | POA: Diagnosis not present

## 2022-05-14 ENCOUNTER — Ambulatory Visit: Payer: Medicare PPO | Admitting: Clinical

## 2022-05-15 ENCOUNTER — Other Ambulatory Visit: Payer: Self-pay | Admitting: Family Medicine

## 2022-05-29 ENCOUNTER — Institutional Professional Consult (permissible substitution): Payer: Medicare PPO | Admitting: Plastic Surgery

## 2022-05-29 DIAGNOSIS — M62838 Other muscle spasm: Secondary | ICD-10-CM | POA: Diagnosis not present

## 2022-05-29 DIAGNOSIS — F959 Tic disorder, unspecified: Secondary | ICD-10-CM | POA: Diagnosis not present

## 2022-05-29 DIAGNOSIS — R519 Headache, unspecified: Secondary | ICD-10-CM | POA: Diagnosis not present

## 2022-05-29 DIAGNOSIS — G479 Sleep disorder, unspecified: Secondary | ICD-10-CM | POA: Diagnosis not present

## 2022-05-31 ENCOUNTER — Ambulatory Visit (INDEPENDENT_AMBULATORY_CARE_PROVIDER_SITE_OTHER): Payer: Medicare PPO | Admitting: Clinical

## 2022-05-31 DIAGNOSIS — F332 Major depressive disorder, recurrent severe without psychotic features: Secondary | ICD-10-CM | POA: Diagnosis not present

## 2022-05-31 DIAGNOSIS — F411 Generalized anxiety disorder: Secondary | ICD-10-CM | POA: Diagnosis not present
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
## 2022-05-31 NOTE — Progress Notes (Signed)
                Velita Quirk, LCSW 

## 2022-06-01 ENCOUNTER — Other Ambulatory Visit: Payer: Self-pay | Admitting: Family Medicine

## 2022-06-04 ENCOUNTER — Ambulatory Visit: Payer: Medicare PPO | Admitting: Family Medicine

## 2022-06-04 ENCOUNTER — Encounter: Payer: Self-pay | Admitting: Family Medicine

## 2022-06-04 VITALS — BP 90/64 | HR 85 | Temp 97.2°F | Ht 64.0 in | Wt 263.2 lb

## 2022-06-04 DIAGNOSIS — M542 Cervicalgia: Secondary | ICD-10-CM

## 2022-06-04 DIAGNOSIS — G5622 Lesion of ulnar nerve, left upper limb: Secondary | ICD-10-CM

## 2022-06-04 DIAGNOSIS — R200 Anesthesia of skin: Secondary | ICD-10-CM | POA: Diagnosis not present

## 2022-06-04 NOTE — Progress Notes (Signed)
Mackenzie Speranza T. [name], MD, CAQ Sports Medicine [facility] at [facility] [address] [place], [address]  Phone: [phone]  FAX: [phone]  Mackenzie Bonilla - 57 y.o. female  MRN [id number]  Date of Birth: [birth year]  Date: 06/04/2022  PCP: Mackenzie Pimple, MD  Referral: Mackenzie Pimple, MD  Chief Complaint  Patient presents with   Arm Pain    Left   Neck Pain   Subjective:   Mackenzie Bonilla is a 57 y.o. very pleasant female patient with Body mass index is 45.19 kg/m. who presents with the following:  Patient presents with left-sided arm pain.  Cubital tunnel syndrome on the left.  She very recently saw neurology, they felt as if she had cubital tunnel.  They recommended a cubital tunnel brace to keep the arm in extension while resting.  This point she has not gotten 1 and wanted to discuss this further with me.  Their plan was to follow-up and if symptoms persist obtain nerve conduction studies and EMG to better characterize the nerve impairment.  L fingers do go to sleep.   Neurology recommended a cubital tunnel brace.   She has seen pain management for ESI, some dorsal rhizotomy -this was all for her back  She is also having some neck pain, which is not particularly new. Trapezius b hurting  Cubital syndrome  Cannot take ibuprofen  Review of Systems is noted in the HPI, as appropriate  Objective:   BP 90/64   Pulse 85   Temp (!) 97.2 F (36.2 C) (Temporal)   Ht 5\' 4"  (1.626 m)   Wt 263 lb 4 oz (119.4 kg)   SpO2 95%   BMI 45.19 kg/m   GEN: No acute distress; alert,appropriate. PULM: Breathing comfortably in no respiratory distress PSYCH: Normally interactive.    CERVICAL SPINE EXAM Range of motion: Flexion, extension, lateral bending, and rotation: Minimal impairment Pain with terminal motion: Mild Spinous Processes: NT SCM: NT Upper paracervical muscles: Minimal tenderness Upper traps: Mild tenderness C5-T1  intact, sensation and motor    Left elbow Ecchymosis or edema: neg ROM: full flexion, extension, pronation, supination Shoulder ROM: Full Flexion: 5/5 Extension: 5/5 Supination: 5/5  Pronation: 5/5 Wrist ext: 5/5 Wrist flexion: 5/5 No gross bony abnormality Varus and Valgus stress: stable ECRB tenderness: neg Medial epicondyle: NT Lateral epicondyle, resisted wrist extension from wrist full pronation and flexion: NT grip: 5/5  sensation intact Tinel's, Elbow: Positive  Laboratory and Imaging Data:  Assessment and Plan:     ICD-10-CM   1. Cubital tunnel syndrome on left  G56.22     2. Cervicalgia  M54.2     3. Left arm numbness  R20.0      I agree with neurology that this is most likely cubital tunnel.  She really has no symptoms with compression of the carpal tunnel or Tinel's or Phalen's at the wrist.  She does have a positive Tinel's at the elbow.  Her symptoms do worsen when she flexes at the elbow as well as when she rests her elbow on the surface.  Plan from neurology was to continue conservative care for the next 2 to 3 months, and if symptoms persist then they would consider EMG and nerve conduction velocity.  I think that sounds perfectly reasonable plan.  Neck origin cannot fully be excluded, but it seems clinically and by history that this would most likely be an elbow driven change.  Neck is reasonably benign  with some trapezius tenderness.  At this point I would not do anything other than take Tylenol basic motion and heat.  Medication Management during today's office visit: No orders of the defined types were placed in this encounter.  Medications Discontinued During This Encounter  Medication Reason   clonazePAM (KLONOPIN) 0.5 MG tablet Change in therapy   MOUNJARO 12.5 MG/0.5ML Pen Dose change    Orders placed today for conditions managed today: No orders of the defined types were placed in this encounter.   Disposition: No follow-ups on  file.  [employer] One speech-to-text software was used for transcription in this dictation.  Possible transcriptional errors can occur using Animal nutritionist.   Signed,  Mackenzie Galea. Dencil Cayson, MD   Outpatient Encounter Medications as of 06/04/2022  Medication Sig   acetaminophen (TYLENOL) 500 MG tablet Take 500 mg by mouth every 6 (six) hours as needed.    albuterol (VENTOLIN HFA) 108 (90 Base) MCG/ACT inhaler INHALE 2 PUFFS INTO THE LUNGS EVERY 6 HOURS AS NEEDED FOR WHEEZING.   ALPRAZolam (XANAX) 1 MG tablet Take 2 mg by mouth at bedtime.   Bacillus Coagulans-Inulin (PROBIOTIC-PREBIOTIC) 1-250 BILLION-MG CAPS Take 1 Capful by mouth daily.   Blood Glucose Monitoring Suppl (FIFTY50 GLUCOSE METER 2.0) w/Device KIT Use as directed   buPROPion (WELLBUTRIN XL) 150 MG 24 hr tablet Take 150 mg by mouth daily.   cyclobenzaprine (FLEXERIL) 5 MG tablet TAKE 1 TABLET BY MOUTH 3 TIMES DAILY AS NEEDED FOR MUSCLE SPASMS   desvenlafaxine (PRISTIQ) 100 MG 24 hr tablet Take 100 mg by mouth daily.   dimenhyDRINATE (DRAMAMINE) 50 MG tablet Take 50 mg by mouth every 8 (eight) hours as needed.   docusate sodium (COLACE) 100 MG capsule Take 1 capsule (100 mg total) by mouth 2 (two) times daily.   fluticasone (FLONASE) 50 MCG/ACT nasal spray Place into both nostrils daily.   gabapentin (NEURONTIN) 100 MG capsule Take 100 mg by mouth every morning.   gabapentin (NEURONTIN) 300 MG capsule Take 1 capsule (300 mg total) by mouth at bedtime.   losartan (COZAAR) 25 MG tablet    minoxidil (LONITEN) 2.5 MG tablet Take 1 tablet (2.5 mg total) by mouth daily.   omeprazole (PRILOSEC) 20 MG capsule TAKE 1 CAPSULE BY MOUTH 2 TIMES DAILY.   oxybutynin (DITROPAN XL) 15 MG 24 hr tablet TAKE 1 TABLET BY MOUTH DAILY   Polyethyl Glycol-Propyl Glycol (SYSTANE OP) Apply 1 drop to eye daily as needed.   pravastatin (PRAVACHOL) 20 MG tablet 1 TABLET BY MOUTH AT BEDTIME.   Simethicone (GAS-X PO) Take by mouth as needed.    spironolactone (ALDACTONE) 100 MG tablet TAKE 1 TABLET BY MOUTH DAILY   tirzepatide (MOUNJARO) 15 MG/0.5ML Pen Inject 15 mg into the skin once a week.   traMADol (ULTRAM) 50 MG tablet TAKE ONE TABLET TWICE A DAY IF NEEDED FOR SEVERE PAIN   traZODone (DESYREL) 150 MG tablet Take 300 mg by mouth at bedtime.   TRESIBA FLEXTOUCH 100 UNIT/ML FlexTouch Pen Inject 46 Units into the skin daily.   XEOMIN 50 units SOLR injection Inject 50 Units into the muscle every 3 (three) months.   zinc oxide (BALMEX) 11.3 % CREA cream Apply 1 application topically 2 (two) times daily.   [DISCONTINUED] clonazePAM (KLONOPIN) 0.5 MG tablet Take 0.25 mg by mouth daily. Start .25 mg nightly for one week. Then increase to 0.50 mg, take two in the am and .25 mg at nightly.   [DISCONTINUED] MOUNJARO 12.5 MG/0.5ML  Pen Inject 15 mg into the skin once a week.   [DISCONTINUED] pravastatin (PRAVACHOL) 20 MG tablet 1 TABLET BY MOUTH AT BEDTIME.   No facility-administered encounter medications on file as of 06/04/2022.

## 2022-06-04 NOTE — Patient Instructions (Signed)
You should be able to take generic ibuprofen orally. (Over the counter Motrin, Advil, or Generic Ibuprofen 200 mg tablets. 3-4 tablets by mouth 3 times a day. This equals a prescription strength dose.)  

## 2022-06-05 ENCOUNTER — Encounter: Payer: Self-pay | Admitting: Family Medicine

## 2022-06-07 DIAGNOSIS — Z794 Long term (current) use of insulin: Secondary | ICD-10-CM | POA: Diagnosis not present

## 2022-06-07 DIAGNOSIS — E1129 Type 2 diabetes mellitus with other diabetic kidney complication: Secondary | ICD-10-CM | POA: Diagnosis not present

## 2022-06-07 DIAGNOSIS — E1142 Type 2 diabetes mellitus with diabetic polyneuropathy: Secondary | ICD-10-CM | POA: Diagnosis not present

## 2022-06-07 DIAGNOSIS — R809 Proteinuria, unspecified: Secondary | ICD-10-CM | POA: Diagnosis not present

## 2022-06-07 IMAGING — CR DG THORACIC SPINE 2V
3 series · 3 of 3 positions shown · non-contrast
Comparison: None.

CLINICAL DATA: Dorsalgia

EXAM:
THORACIC SPINE 3 VIEWS

[t-spine lat]
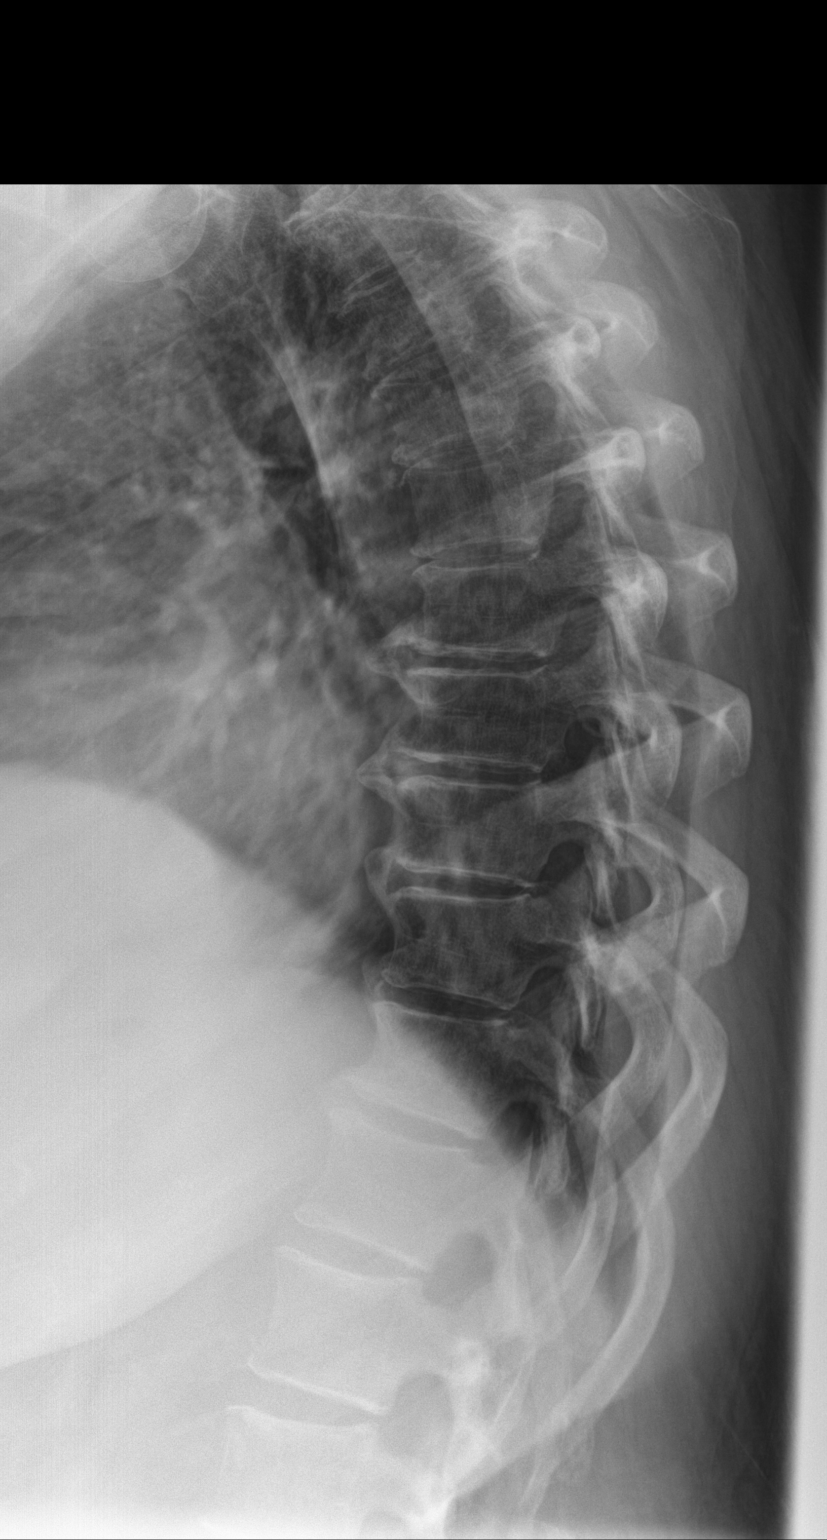

[t-spine swimmers]
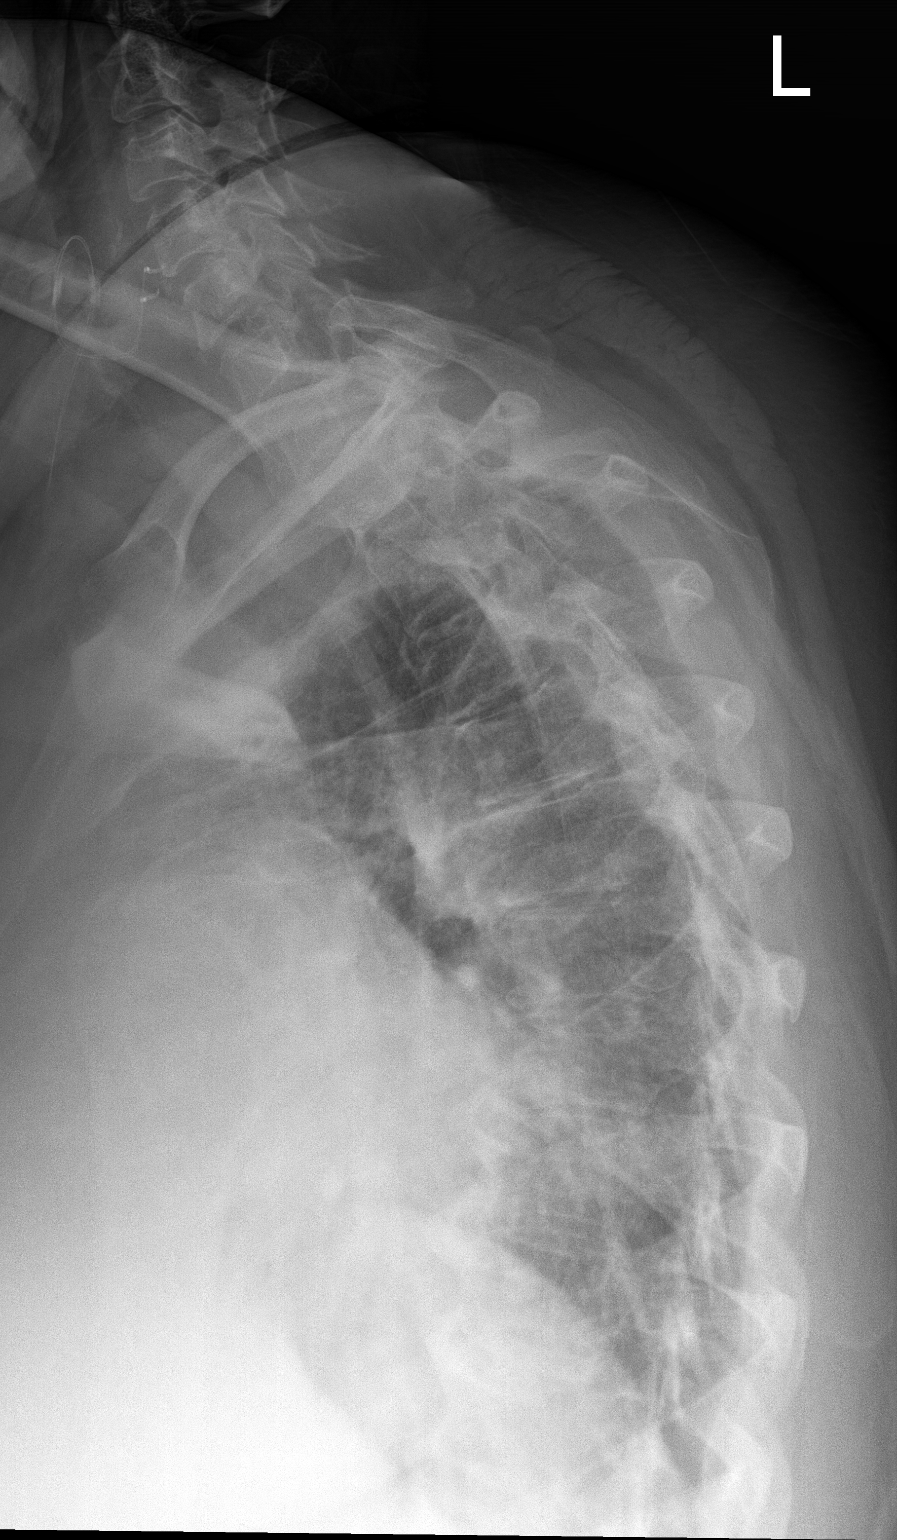

[t-spine ap]
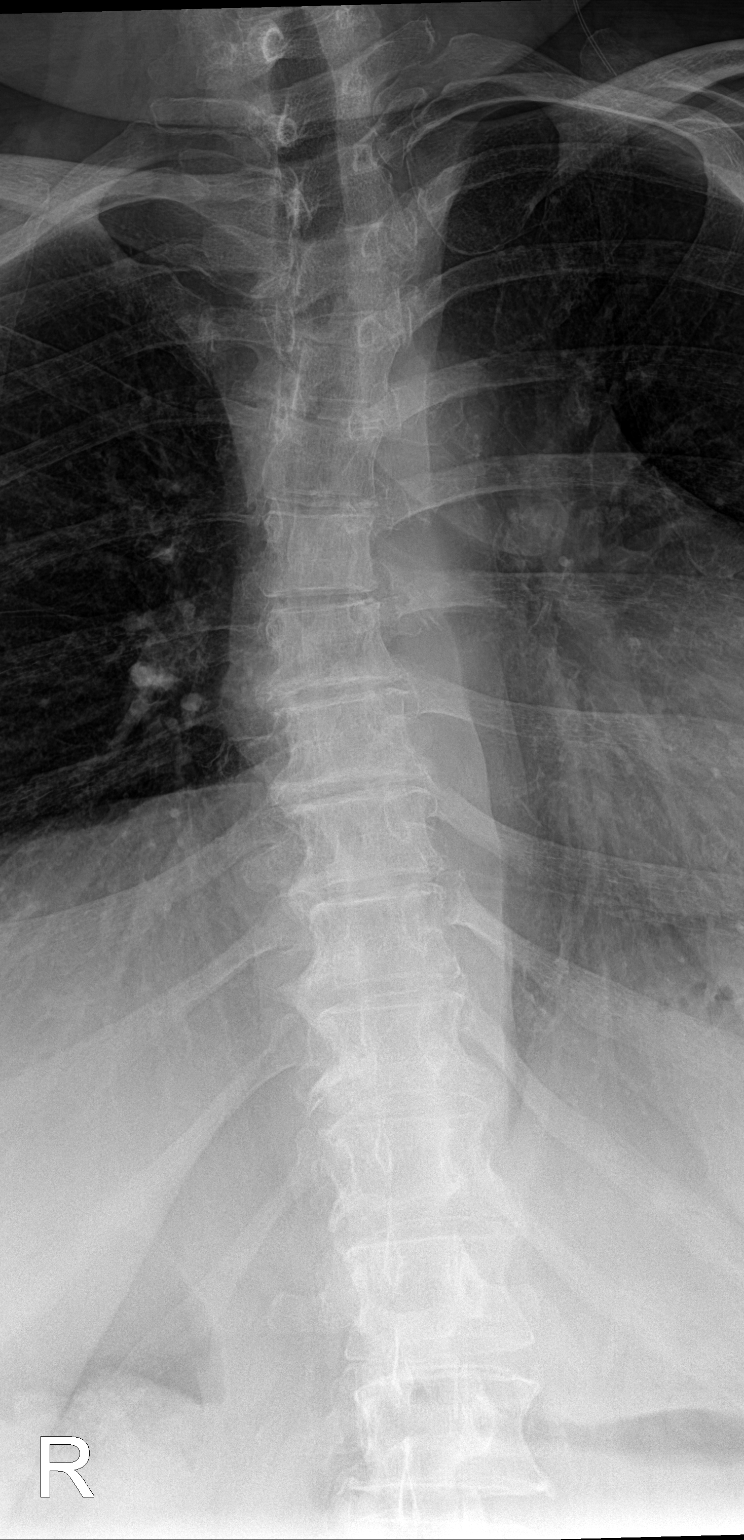

[3 of 3 positions shown; findings below may reference images not displayed]

FINDINGS: Frontal, lateral, and swimmer's views were obtained. There is
midthoracic dextroscoliosis. There is no fracture or
spondylolisthesis. There is mild disc space narrowing at several
levels. No erosive change or paraspinous lesions. There are anterior
and right lateral osteophytes at several levels. No erosive change
or paraspinous lesion. Visualized lungs clear.
IMPRESSION: Scoliosis. Disc space narrowing and osteophyte formation at several
levels. No fracture or spondylolisthesis.

## 2022-06-11 DIAGNOSIS — R519 Headache, unspecified: Secondary | ICD-10-CM | POA: Diagnosis not present

## 2022-06-11 DIAGNOSIS — M62838 Other muscle spasm: Secondary | ICD-10-CM | POA: Diagnosis not present

## 2022-06-12 ENCOUNTER — Ambulatory Visit: Payer: Medicare PPO | Admitting: Dermatology

## 2022-06-12 VITALS — BP 117/74

## 2022-06-12 DIAGNOSIS — L649 Androgenic alopecia, unspecified: Secondary | ICD-10-CM

## 2022-06-12 DIAGNOSIS — L821 Other seborrheic keratosis: Secondary | ICD-10-CM

## 2022-06-12 DIAGNOSIS — L853 Xerosis cutis: Secondary | ICD-10-CM | POA: Diagnosis not present

## 2022-06-12 DIAGNOSIS — L820 Inflamed seborrheic keratosis: Secondary | ICD-10-CM | POA: Diagnosis not present

## 2022-06-12 MED ORDER — MINOXIDIL 2.5 MG PO TABS
2.5000 mg | ORAL_TABLET | Freq: Every day | ORAL | 1 refills | Status: DC
Start: 2022-06-12 — End: 2022-11-20

## 2022-06-12 NOTE — Patient Instructions (Addendum)
Cryotherapy Aftercare  Wash gently with soap and water everyday.   Apply Vaseline and Band-Aid daily until healed.    Recommend starting moisturizer with exfoliant (Urea, Salicylic acid, or Lactic acid) one to two times daily to help smooth rough and bumpy skin.  OTC options include Cetaphil Rough and Bumpy lotion (Urea), Eucerin Roughness Relief lotion or spot treatment cream (Urea), CeraVe SA lotion/cream for Rough and Bumpy skin (Sal Acid), Gold Bond Rough and Bumpy cream (Sal Acid), and AmLactin 12% lotion/cream (Lactic Acid).  If applying in morning, also apply sunscreen to sun-exposed areas, since these exfoliating moisturizers can increase sensitivity to sun.   Due to recent changes in healthcare laws, you may see results of your pathology and/or laboratory studies on MyChart before the doctors have had a chance to review them. We understand that in some cases there may be results that are confusing or concerning to you. Please understand that not all results are received at the same time and often the doctors may need to interpret multiple results in order to provide you with the best plan of care or course of treatment. Therefore, we ask that you please give us 2 business days to thoroughly review all your results before contacting the office for clarification. Should we see a critical lab result, you will be contacted sooner.   If You Need Anything After Your Visit  If you have any questions or concerns for your doctor, please call our main line at [phone] and press option 4 to reach your doctor's medical assistant. If no one answers, please leave a voicemail as directed and we will return your call as soon as possible. Messages left after 4 pm will be answered the following business day.   You may also send us a message via MyChart. We typically respond to MyChart messages within 1-2 business days.  For prescription refills, please ask your pharmacy to contact our office. Our fax  number is [phone].  If you have an urgent issue when the clinic is closed that cannot wait until the next business day, you can page your doctor at the number below.    Please note that while we do our best to be available for urgent issues outside of office hours, we are not available 24/7.   If you have an urgent issue and are unable to reach us, you may choose to seek medical care at your doctor's office, retail clinic, urgent care center, or emergency room.  If you have a medical emergency, please immediately call 911 or go to the emergency department.  Pager Numbers  - Dr. Kowalski: [phone]  - Dr. Moye: [phone]  - Dr. Stewart: [phone]  In the event of inclement weather, please call our main line at [phone] for an update on the status of any delays or closures.  Dermatology Medication Tips: Please keep the boxes that topical medications come in in order to help keep track of the instructions about where and how to use these. Pharmacies typically print the medication instructions only on the boxes and not directly on the medication tubes.   If your medication is too expensive, please contact our office at [phone] option 4 or send us a message through MyChart.   We are unable to tell what your co-pay for medications will be in advance as this is different depending on your insurance coverage. However, we may be able to find a substitute medication at lower cost or fill out paperwork to get insurance to cover   a needed medication.   If a prior authorization is required to get your medication covered by your insurance company, please allow us 1-2 business days to complete this process.  Drug prices often vary depending on where the prescription is filled and some pharmacies may offer cheaper prices.  The website www.goodrx.com contains coupons for medications through different pharmacies. The prices here do not account for what the cost may be with help  from insurance (it may be cheaper with your insurance), but the website can give you the price if you did not use any insurance.  - You can print the associated coupon and take it with your prescription to the pharmacy.  - You may also stop by our office during regular business hours and pick up a GoodRx coupon card.  - If you need your prescription sent electronically to a different pharmacy, notify our office through [facility] MyChart or by phone at [phone] option 4.     Si Usted Necesita Algo Despus de Su Visita  Tambin puede enviarnos un mensaje a travs de MyChart. Por lo general respondemos a los mensajes de MyChart en el transcurso de 1 a 2 das hbiles.  Para renovar recetas, por favor pida a su farmacia que se ponga en contacto con nuestra oficina. Nuestro nmero de fax es el [phone].  Si tiene un asunto urgente cuando la clnica est cerrada y que no puede esperar hasta el siguiente da hbil, puede llamar/localizar a su doctor(a) al nmero que aparece a continuacin.   Por favor, tenga en cuenta que aunque hacemos todo lo posible para estar disponibles para asuntos urgentes fuera del horario de oficina, no estamos disponibles las 24 horas del da, los 7 das de la semana.   Si tiene un problema urgente y no puede comunicarse con nosotros, puede optar por buscar atencin mdica  en el consultorio de su doctor(a), en una clnica privada, en un centro de atencin urgente o en una sala de emergencias.  Si tiene una emergencia mdica, por favor llame inmediatamente al 911 o vaya a la sala de emergencias.  Nmeros de bper  - Dr. Kowalski: [phone]  - Dra. Moye: [phone]  - Dra. Stewart: [phone]  En caso de inclemencias del tiempo, por favor llame a nuestra lnea principal al [phone] para una actualizacin sobre el estado de cualquier retraso o cierre.  Consejos para la medicacin en dermatologa: Por favor, guarde las cajas en las que vienen los  medicamentos de uso tpico para ayudarle a seguir las instrucciones sobre dnde y cmo usarlos. Las farmacias generalmente imprimen las instrucciones del medicamento slo en las cajas y no directamente en los tubos del medicamento.   Si su medicamento es muy caro, por favor, pngase en contacto con nuestra oficina llamando al [phone] y presione la opcin 4 o envenos un mensaje a travs de MyChart.   No podemos decirle cul ser su copago por los medicamentos por adelantado ya que esto es diferente dependiendo de la cobertura de su seguro. Sin embargo, es posible que podamos encontrar un medicamento sustituto a menor costo o llenar un formulario para que el seguro cubra el medicamento que se considera necesario.   Si se requiere una autorizacin previa para que su compaa de seguros cubra su medicamento, por favor permtanos de 1 a 2 das hbiles para completar este proceso.  Los precios de los medicamentos varan con frecuencia dependiendo del lugar de dnde se surte la receta y alguna farmacias pueden ofrecer precios ms baratos.    El sitio web www.goodrx.com tiene cupones para medicamentos de diferentes farmacias. Los precios aqu no tienen en cuenta lo que podra costar con la ayuda del seguro (puede ser ms barato con su seguro), pero el sitio web puede darle el precio si no utiliz ningn seguro.  - Puede imprimir el cupn correspondiente y llevarlo con su receta a la farmacia.  - Tambin puede pasar por nuestra oficina durante el horario de atencin regular y recoger una tarjeta de cupones de GoodRx.  - Si necesita que su receta se enve electrnicamente a una farmacia diferente, informe a nuestra oficina a travs de MyChart de [facility] o por telfono llamando al [phone] y presione la opcin 4.  

## 2022-06-12 NOTE — Progress Notes (Signed)
Follow-Up Visit   Subjective  Mackenzie Bonilla is a 57 y.o. female who presents for the following: 4 month follow-up Androgenetic Alopecia. Patient feels like she has started losing more hair recently. She is taking Minoxidil 2.5 MG 1 tablet daily, and Spironolactone 100 MG daily prescribed by PCP. She also has a spot on her right forearm to check, treated with cryotherapy in September '23. Didn't clear up.   The following portions of the chart were reviewed this encounter and updated as appropriate: medications, allergies, medical history  Review of Systems:  No other skin or systemic complaints except as noted in HPI or Assessment and Plan.  Objective  Well appearing patient in no apparent distress; mood and affect are within normal limits.  A focused examination was performed of the following areas: Face, scalp, arms  Relevant exam findings are noted in the Assessment and Plan.  R forearm x 1, R wrist x 1, R perioral at chin x 1, L perioral at chin x 1 (4) Firm scaly nodules    Assessment & Plan   ANDROGENETIC ALOPECIA (FEMALE PATTERN HAIR LOSS) Exam: Diffuse thinning of the crown and narrowing of the midline part with retention of the frontal hairline. Improving compared to photo 02/13/2022.   Chronic and persistent condition with duration or expected duration over one year. Condition is symptomatic/ bothersome to patient. Improving but not currently at goal.   Female Androgenic Alopecia is a chronic condition related to genetics and/or hormonal changes.  In women androgenetic alopecia is commonly associated with menopause but may occur any time after puberty.  It causes hair thinning primarily on the crown with widening of the part and temporal hairline recession.  Can use OTC Rogaine (minoxidil) 5% solution/foam as directed.  Oral treatments in female patients who have no contraindication may include : - Low dose oral minoxidil 1.25 - 5mg  daily - Spironolactone 50 - 100mg   bid - Finasteride 2.5 - 5 mg daily Adjunctive therapies include: - Low Level Laser Light Therapy (LLLT) - Platelet-rich plasma injections (PRP) - Hair Transplants or scalp reduction   Treatment Plan: BP 117/74 Continue Minoxidil 2.5 MG 1 tablet daily. Continue Spironolactone 100 MG 1 tablet daily as prescribed by PCP.  Doses of minoxidil for hair loss are considered 'low dose'. This is because the doses used for hair loss are much lower than the doses which are used for conditions such as high blood pressure (hypertension). The doses used for hypertension are 10-40mg  per day.  Side effects are uncommon at the low doses (up to 2.5 mg/day) used to treat hair loss. Potential side effects, more commonly seen at higher doses, include: Increase in hair growth (hypertrichosis) elsewhere on face and body Temporary hair shedding upon starting medication which may last up to 4 weeks Ankle swelling, fluid retention, rapid weight gain more than 5 pounds Low blood pressure and feeling lightheaded or dizzy when standing up quickly Fast or irregular heartbeat Headaches  Inflamed seborrheic keratosis (4) R forearm x 1, R wrist x 1, R perioral at chin x 1, L perioral at chin x 1  vs Prurigo Nodules  Symptomatic, irritating, patient would like treated.  Destruction of lesion - R forearm x 1, R wrist x 1, R perioral at chin x 1, L perioral at chin x 1  Destruction method: cryotherapy   Informed consent: discussed and consent obtained   Lesion destroyed using liquid nitrogen: Yes   Region frozen until ice ball extended beyond lesion: Yes  Outcome: patient tolerated procedure well with no complications   Post-procedure details: wound care instructions given   Additional details:  Prior to procedure, discussed risks of blister formation, small wound, skin dyspigmentation, or rare scar following cryotherapy. Recommend Vaseline ointment to treated areas while healing.   Androgenetic  alopecia  Related Medications minoxidil (LONITEN) 2.5 MG tablet Take 1 tablet (2.5 mg total) by mouth daily.  SEBORRHEIC KERATOSIS WITH XEROSIS - Stuck-on, waxy, tan-brown papules and/or plaques  - Benign-appearing - Discussed benign etiology and prognosis. - Observe - Call for any changes Recommend OTC Gold Bond Rapid Relief Anti-Itch cream (pramoxine + menthol), CeraVe Anti-itch cream or lotion (pramoxine), Sarna lotion (Original- menthol + camphor or Sensitive- pramoxine) or Eucerin 12 hour Itch Relief lotion (menthol) up to 3 times per day to areas on body that are itchy.    Return in about 6 months (around 12/12/2022) for Alopecia.  ICherlyn Labella, CMA, am acting as scribe for Willeen Niece, MD .   Documentation: I have reviewed the above documentation for accuracy and completeness, and I agree with the above.  Willeen Niece, MD

## 2022-06-16 ENCOUNTER — Encounter: Payer: Self-pay | Admitting: Family Medicine

## 2022-06-19 DIAGNOSIS — G245 Blepharospasm: Secondary | ICD-10-CM | POA: Diagnosis not present

## 2022-06-20 ENCOUNTER — Ambulatory Visit (INDEPENDENT_AMBULATORY_CARE_PROVIDER_SITE_OTHER): Payer: Medicare PPO | Admitting: Clinical

## 2022-06-20 DIAGNOSIS — F332 Major depressive disorder, recurrent severe without psychotic features: Secondary | ICD-10-CM | POA: Diagnosis not present

## 2022-06-20 DIAGNOSIS — F411 Generalized anxiety disorder: Secondary | ICD-10-CM | POA: Diagnosis not present

## 2022-06-20 NOTE — Progress Notes (Unsigned)
                Alexiana Laverdure, LCSW 

## 2022-06-20 NOTE — Progress Notes (Unsigned)
[facility] Counselor/Therapist Progress Note  Patient ID: Mackenzie Bonilla, MRN: [id number],    Date: 06/20/2022  Time Spent: 1:35pm - 2:39pm : 64 minutes  Treatment Type: Individual Therapy  Reported Symptoms: lack of energy  Mental Status Exam: Appearance:  Well Groomed     Behavior: Appropriate  Motor: Mannerisms  Speech/Language:  Clear and Coherent  Affect: Tearful  Mood: Patient stated, "I feel kind of hyper" in resposne to mood  Thought process: tangential  Thought content:   Tangential  Sensory/Perceptual disturbances:   WNL  Orientation: oriented to person, place, and situation  Attention: Good  Concentration: Good  Memory: WNL  Fund of knowledge:  Good  Insight:   Fair  Judgment:  Fair  Impulse Control: Fair   Risk Assessment: Danger to Self:  No Patient denied current suicidal ideation  Self-injurious Behavior: No Danger to Others: No Patient denied current homicidal ideation Duty to Warn:no Physical Aggression / Violence:No  Access to Firearms a concern: No  Gang Involvement:No   Subjective: Patient reported she changed neurologist to Dr. Sherryll Burger and had botox for treatment of facial spasms. Patient reported neurologist indicated patient has a history of blepharo spams and diagnosed patient with meige symdrome. Patient reported neurologist indicated patient experiences anxiety with catastrophism. Patient reported when having a procedure such as botox on the mouth she expierences thoughts of worst case scenario. Patient reported concern that others will observe the physical reaction of botox injections. Discussed how to approach conversation about botox. Patient reported lack of energy. Patient reported her mood has been "down" since last session. "I feel kind of hyer" in response to current mood. "It took me a while to think of anybody" in response to the positive qualities survey. Patient reported she asked the indivdiual that does her hair, her  niece, and staff at apartment complex. Teraful at times. Never considered herself as independent and has always considered herself as depenednet on her mother. Hw. Complete positive qualities survey about herself. Irritabiity with herself. Discussed one task per day.   Interventions: Cognitive Behavioral Therapy and supportive therapy.  discussed recent visit with new neurologist and her thoughts/feelings in response to initial visit. provided psycho eudcation related to cognitive distortions of catstrophizing. Reviewed outcome of positive qualities survey.   Last-Clinician conducted session in person at clinician's office at [facility]. Reviewed events since last session. Provided supportive therapy, active and reflective listening, and validation as patient discussed recent appointment with psychiatrist, neurologist, providers' treatment recommendations, history of trauma, patient's self image, and patient's thoughts/feelings in response.  Provided psycho education related to psychotropic medications. Provided psycho education related to PTSD and treatment of trauma. Provided psycho education related to forgiveness. Provided psycho education related to trauma focused treatment and the possibility of transitioning patient to a therapist that specializes in treatment of trauma. Clinician requested for homework patient complete positive qualities survey.    Collaboration of Care: Other not required at this time   Diagnosis:  Generalized anxiety disorder   Severe episode of recurrent major depressive disorder, without psychotic features (HCC)      Plan: Patient is to utilize [employer] [facility], thought re-framing, dialectical behavior therapy skills, and coping strategies to decrease symptoms associated with Generalized Anxiety Disorder and Major Depressive Disorder. Frequency: bi-weekly  Modality: individual      Long-term goal:   Reduce overall level, frequency, and intensity  of the symptoms of depression and anxiety as evidenced by decreased difficulty making decisions, feeling anxious, staying at  home, nightmares, difficulty staying asleep, racing thoughts, worry about her finances, decreased concentration, restlessness, excessive worry, lack of energy, and increased appetite from 6 to 7 days per week to 0 to 1 days per week per patient's report for at least 3 consecutive months.   Target Date: 12/07/22  Progress: progressing    Short-term goal:  Increase daily care of personal grooming and hygiene as evidenced by showering and getting dressed daily   Target Date: 06/07/22  Progress: progressing    Increase daily activities related to household chores, such as cleaning,  laundry, paying monthly bills Target Date: 06/07/22  Progress: progressing    Write at least one positive affirmation about herself daily   Target Date: 06/07/22  Progress: progressing    Learn and implement social skills to reduce depressive symptoms and build confidence in social interactions   Target Date: 06/07/22  Progress: progressing    Identify negative self talk used to reinforce low self esteem and replace with positive self talk   Target Date: 06/07/22  Progress: progressing    Increase participation in social opportunities from 0 days per week to 1 days per week   Target Date: 06/07/22  Progress: progressing    Decrease feelings of anger, depression, and anxiety by developing healthy coping mechanisms   Target Date: 06/07/22  Progress: progressing    Doree Barthel, LCSW

## 2022-07-03 ENCOUNTER — Ambulatory Visit: Payer: Medicare PPO | Admitting: Psychiatry

## 2022-07-04 ENCOUNTER — Ambulatory Visit: Payer: Medicare PPO | Admitting: Clinical

## 2022-07-10 ENCOUNTER — Ambulatory Visit: Payer: Medicare PPO | Admitting: Podiatry

## 2022-07-10 ENCOUNTER — Other Ambulatory Visit: Payer: Self-pay | Admitting: Podiatry

## 2022-07-10 ENCOUNTER — Telehealth: Payer: Self-pay | Admitting: Podiatry

## 2022-07-10 DIAGNOSIS — M79675 Pain in left toe(s): Secondary | ICD-10-CM | POA: Diagnosis not present

## 2022-07-10 DIAGNOSIS — M79674 Pain in right toe(s): Secondary | ICD-10-CM

## 2022-07-10 DIAGNOSIS — B351 Tinea unguium: Secondary | ICD-10-CM

## 2022-07-10 MED ORDER — GABAPENTIN 300 MG PO CAPS: 300.0000 mg | ORAL_CAPSULE | Freq: Every day | ORAL | 1 refills | Status: DC

## 2022-07-10 NOTE — Progress Notes (Signed)
Chief Complaint  Patient presents with   Diabetes    Patient came in today for Diabetic foot care, nail trim, A1c- 5.6 BG- 100 (last week)    SUBJECTIVE Patient presents to office today complaining of elongated, thickened nails that cause pain while ambulating in shoes.  Patient is unable to trim their own nails. Patient is here for further evaluation and treatment.  Past Medical History:  Diagnosis Date   Allergy    allergic rhinitis   Anemia    Anxiety    Arthritis    osteoarthritis   Asthma    Claustrophobia    does not like oxygen mask covering face   Depression    Diabetes mellitus without complication (HCC)    Fall 2016   Fatty liver 07/2008   abd. ultrasound - fatty liver ; slt dilated cbd (no stones ) 06/10// abd.  ultrasound normal on 04/2006   Fibromyalgia    GERD (gastroesophageal reflux disease)    EGD negative 06/2001// EGD erythematous mucosa, polyp 08/2008   High uric acid in 24 hour urine specimen    Hyperhidrosis    Hyperlipidemia    Kidney stones    Lymphedema    Obesity    PCOS (polycystic ovarian syndrome)    Shortness of breath dyspnea    Tachycardia    TMJ (dislocation of temporomandibular joint)    UTI (lower urinary tract infection)     Allergies  Allergen Reactions   Cephalexin    Codeine     rash   Duloxetine    Levaquin [Levofloxacin] Nausea Only   Lisinopril Cough   Metformin And Related Diarrhea    GI side effects    Quetiapine Other (See Comments)   Sertraline Hcl     REACTION: vivid dreams   Triazolam     REACTION: ? reaction   Zaleplon    Zocor [Simvastatin - High Dose]     achey   Zolpidem Tartrate     hallucinations   Zyrtec [Cetirizine]     Sedation    Hydrocodone Itching and Rash    REACTION: redness rash itching   Norelgestromin-Eth Estradiol     Patch didn't stick to skin     OBJECTIVE General Patient is awake, alert, and oriented x 3 and in no acute distress. Derm Skin is dry and supple bilateral.  Negative open lesions or macerations. Remaining integument unremarkable. Nails are tender, long, thickened and dystrophic with subungual debris, consistent with onychomycosis, 1-5 bilateral. No signs of infection noted. Vasc  DP and PT pedal pulses palpable bilaterally. Temperature gradient within normal limits.  Neuro Epicritic and protective threshold sensation grossly intact bilaterally.  Musculoskeletal Exam No symptomatic pedal deformities noted bilateral. Muscular strength within normal limits.  ASSESSMENT 1.  Pain due to onychomycosis of toenails both  PLAN OF CARE 1. Patient evaluated today.  2. Instructed to maintain good pedal hygiene and foot care.  3. Mechanical debridement of nails 1-5 bilaterally performed using a nail nipper. Filed with dremel without incident.  4. Return to clinic in 3 mos.    Felecia Shelling, DPM [facility]  Dr. Felecia Shelling, DPM    [address], [place] [address]  Office [phone]  Fax [phone]

## 2022-07-10 NOTE — Telephone Encounter (Signed)
"  I need a refill on my Gabapentin 300 mg.  I forgot to ask him when I was in the room."

## 2022-07-11 ENCOUNTER — Other Ambulatory Visit: Payer: Self-pay | Admitting: Family Medicine

## 2022-07-16 ENCOUNTER — Telehealth: Payer: Self-pay

## 2022-07-16 NOTE — Telephone Encounter (Signed)
Agree with ER precautions  °Will see her then °

## 2022-07-16 NOTE — Telephone Encounter (Signed)
I spoke with pt; pt has had abd pain (now pain level 3) and abd tightness; last BM one day last wk and pt having nausea on and off when eats;pt has not seen any blood. [facility] but pt only wants to see Dr Milinda Antis. Pt has appt with Dr Milinda Antis 07/18/22 at 12 noon with UC & ED precautions given and pt voiced understanding. Sending note to Dr Milinda Antis.

## 2022-07-18 ENCOUNTER — Ambulatory Visit (INDEPENDENT_AMBULATORY_CARE_PROVIDER_SITE_OTHER): Payer: Medicare PPO | Admitting: Clinical

## 2022-07-18 ENCOUNTER — Ambulatory Visit: Payer: Medicare PPO | Admitting: Family Medicine

## 2022-07-18 ENCOUNTER — Encounter: Payer: Self-pay | Admitting: Family Medicine

## 2022-07-18 VITALS — BP 106/64 | HR 91 | Temp 97.8°F | Ht 64.0 in | Wt 262.4 lb

## 2022-07-18 DIAGNOSIS — F332 Major depressive disorder, recurrent severe without psychotic features: Secondary | ICD-10-CM | POA: Diagnosis not present

## 2022-07-18 DIAGNOSIS — R1084 Generalized abdominal pain: Secondary | ICD-10-CM

## 2022-07-18 DIAGNOSIS — T471X5A Adverse effect of other antacids and anti-gastric-secretion drugs, initial encounter: Secondary | ICD-10-CM

## 2022-07-18 DIAGNOSIS — K582 Mixed irritable bowel syndrome: Secondary | ICD-10-CM

## 2022-07-18 DIAGNOSIS — K5900 Constipation, unspecified: Secondary | ICD-10-CM

## 2022-07-18 DIAGNOSIS — Z1211 Encounter for screening for malignant neoplasm of colon: Secondary | ICD-10-CM

## 2022-07-18 DIAGNOSIS — F411 Generalized anxiety disorder: Secondary | ICD-10-CM | POA: Diagnosis not present

## 2022-07-18 LAB — VITAMIN D 25 HYDROXY (VIT D DEFICIENCY, FRACTURES): VITD: 38.94 ng/mL (ref 30.00–100.00)

## 2022-07-18 LAB — CBC WITH DIFFERENTIAL/PLATELET
Basophils Absolute: 0.1 10*3/uL (ref 0.0–0.1)
Basophils Relative: 0.7 % (ref 0.0–3.0)
Eosinophils Absolute: 0.2 10*3/uL (ref 0.0–0.7)
Eosinophils Relative: 2.5 % (ref 0.0–5.0)
HCT: 42.8 % (ref 36.0–46.0)
Hemoglobin: 14.2 g/dL (ref 12.0–15.0)
Lymphocytes Relative: 18 % (ref 12.0–46.0)
Lymphs Abs: 1.4 10*3/uL (ref 0.7–4.0)
MCHC: 33.1 g/dL (ref 30.0–36.0)
MCV: 95.7 fl (ref 78.0–100.0)
Monocytes Absolute: 0.5 10*3/uL (ref 0.1–1.0)
Monocytes Relative: 6.4 % (ref 3.0–12.0)
Neutro Abs: 5.5 10*3/uL (ref 1.4–7.7)
Neutrophils Relative %: 72.4 % (ref 43.0–77.0)
Platelets: 258 10*3/uL (ref 150.0–400.0)
RBC: 4.47 Mil/uL (ref 3.87–5.11)
RDW: 13.2 % (ref 11.5–15.5)
WBC: 7.6 10*3/uL (ref 4.0–10.5)

## 2022-07-18 LAB — TSH: TSH: 0.87 u[IU]/mL (ref 0.35–5.50)

## 2022-07-18 LAB — VITAMIN B12: Vitamin B-12: 442 pg/mL (ref 211–911)

## 2022-07-18 NOTE — Assessment & Plan Note (Addendum)
The mounjaro (now at 10 mg) may be worsening her constipation and GI symptoms   Instructed to hold for a week Talk to her endo Continue miralax Occ stimulant laxative is ok but not regularly  Try and get more fluids (fiber as tolerated)  GI referral done

## 2022-07-18 NOTE — Progress Notes (Unsigned)
[facility] Counselor/Therapist Progress Note  Patient ID: Mackenzie Bonilla, MRN: [id number],    Date: 07/18/2022  Time Spent: 1:31pm - 2:35pm : 64 minutes   Treatment Type: Individual Therapy  Reported Symptoms: depressed mood, tearful  Mental Status Exam: Appearance:  Neat and Well Groomed     Behavior: Attention-Seeking  Motor: Mannerisms  Speech/Language:  Clear and Coherent  Affect: Tearful  Mood: depressed  Thought process: normal  Thought content:   WNL  Sensory/Perceptual disturbances:   WNL  Orientation: oriented to person, place, and situation  Attention: Good  Concentration: Good  Memory: WNL  Fund of knowledge:  Good  Insight:   Poor  Judgment:  Fair  Impulse Control: Fair   Risk Assessment: Danger to Self:   Patient stated, "I don't know" when clinician assessed for suicidal ideation.  Patient stated, "I'm not going to hurt myself, like with pills or something, but it would feel so much better". Patient reported she would like to sleep for several days. Patient reported a history of overdose. Patient's primary care provider, Dr. Roxy Manns, assessed patient for suicidal ideation and patient replied, "no" when Dr. Milinda Antis asked patient about suicidal ideation. Patient reported she was feeling frustrated when making statements during session but reported no plan or intent to follow through with thoughts.  Self-injurious Behavior: No Danger to Others: No Patient denied current homicidal ideation Duty to Warn:no Physical Aggression / Violence:No  Access to Firearms a concern: No Patient reported no access to firearms Gang Involvement:No   Subjective: Patient reported recent Botox injections and reported she is experiencing difficulty drinking since receiving the Botox injections. Patient reported pain in her stomach and reported she was seen by her primary care provider today. Patient reported her primary care provider scheduled patient for a  colonoscopy. Patient stated,  "I'm just tired of having to do so much stuff just to keep going". Patient stated, "I'm tired of it, just trying to survive". Patient reported frustration as it relates to communication with her mother and reported her mother's health is a stressor. Patient reported she purchased tea for her mother today and reported frustration when patient realized the tea was not unsweetened. Patient stated, "last week I was so depressed". Patient reported she did not feel well physically last week. Patient reported she was not feeling well last week and told her mother she did not want to go out to eat to celebrate. Patient reported she told her mother she preferred to celebrate at her mother's home with cake and ice cream. Patient reported after feeling better physically she approached her sisters about going out to eat to celebrate. Patient reported her sister's response "gave me a cold prickly feeling". Patient reported feeling "no enthusiasm", "no happiness". Patient reported she feels it is hard to plan ahead because she doesn't know how she is going to feel.   Interventions: Cognitive Behavioral Therapy and safety assessment . Clinician conducted session in person at clinician's office at [facility]. Reviewed events since last session. Discussed current stressors and patient's thoughts/feelings in response. Discussed patient's frustrations related to coordinating patient's birthday celebration. Discussed current symptoms and assessed for safety. Discussed the following safety plan with patient and patient's primary care provider, Dr. Roxy Manns, and patient agreed to the following: patient will call her psychiatrist, Dr. Nicholes Stairs [facility] at [facility], [facility], or [facility]. Discussed with patient and provided additional resources for patient to utilize in the event patient  experiences suicidal ideation, such as, calling 911, going to a  local emergency room, [facility] walk in crisis services, [facility], calling [phone] or [facility] behavioral [facility] crisis line. Patient agreed to call her psychiatrist, Dr. Evelene Croon, to request a more immediate appointment. Clinician offered patient a follow up appointment tomorrow, June 6th and patient declined appointment. Patient agreed to a follow up appointment on June 11th at 2:30pm.     Collaboration of Care: Patient provided verbal consent for patient's primary care provider, Dr. Roxy Manns, to join today's session and discuss current safety concerns. During session Dr. Milinda Antis assessed patient for suicidal ideation and participated in safety planning.    Diagnosis:  Generalized anxiety disorder   Severe episode of recurrent major depressive disorder, without psychotic features (HCC)      Plan: Patient is to utilize [employer] [facility], thought re-framing, dialectical behavior therapy skills, and coping strategies to decrease symptoms associated with Generalized Anxiety Disorder and Major Depressive Disorder. Frequency: bi-weekly  Modality: individual      Long-term goal:   Reduce overall level, frequency, and intensity of the symptoms of depression and anxiety as evidenced by decreased difficulty making decisions, feeling anxious, staying at home, nightmares, difficulty staying asleep, racing thoughts, worry about her finances, decreased concentration, restlessness, excessive worry, lack of energy, and increased appetite from 6 to 7 days per week to 0 to 1 days per week per patient's report for at least 3 consecutive months.   Target Date: 12/07/22  Progress: progressing    Short-term goal:  Increase daily care of personal grooming and hygiene as evidenced by showering and getting dressed daily   Target Date: 06/07/22  Progress: progressing    Increase daily activities related to household chores, such as cleaning,  laundry, paying monthly bills [employer]  Date: 06/07/22  Progress: progressing    Write at least one positive affirmation about herself daily   Target Date: 06/07/22  Progress: progressing    Learn and implement social skills to reduce depressive symptoms and build confidence in social interactions   Target Date: 06/07/22  Progress: progressing    Identify negative self talk used to reinforce low self esteem and replace with positive self talk   Target Date: 06/07/22  Progress: progressing    Increase participation in social opportunities from 0 days per week to 1 days per week   Target Date: 06/07/22  Progress: progressing    Decrease feelings of anger, depression, and anxiety by developing healthy coping mechanisms   Target Date: 06/07/22  Progress: progressing     Doree Barthel, LCSW

## 2022-07-18 NOTE — Assessment & Plan Note (Signed)
Due for colonoscopy in July  Having more abd pain and constipation Referred to GI in [facility]

## 2022-07-18 NOTE — Progress Notes (Signed)
Subjective:    Patient ID: Mackenzie Bonilla, female    DOB: 1965-10-12, 57 y.o.   MRN: [id number]  HPI Pt presents for c/o constipation with abdominal pain   Wt Readings from Last 3 Encounters:  07/18/22 262 lb 6 oz (119 kg)  06/04/22 263 lb 4 oz (119.4 kg)  04/30/22 265 lb 2 oz (120.3 kg)   45.04 kg/m  Vitals:   07/18/22 1200  BP: 106/64  Pulse: 91  Temp: 97.8 F (36.6 C)  SpO2: 96%     Called 6/3 noting abd pain and tightness Last bm was week prior  Some mild nausea when eating  No blood in stool   Last week she had a choking spell taking medicine / due to a lip problem- coughed very hard  Cough on /off for few weeks (a hoarse cough)  Abdomen looked different  -felt like her umbilicus shifted to the L / uncomfortable at night  That has improved  Her abdomen felt hard to the touch at that time   Last bm was the week prior- very long stool  Felt sick / nausea - worse when eating or after  No blood in stool   Tried mother's probiotic-made her worse    H/o constipation predominant IBS  Takes mounjaro for DM2 and weight  Is on 15 and is starting to feel like this is too much for her (could not find the 12.5) -so went down to 10 mg    Last colonoscopy 08/2012-normal  Dr Jearl Klinefelter daily  This can make stool too loose (mushy) and hard to control  Stopped the probiotic that made her more constipated  Not taking fiber   In past Stool softeners do not work (dulcolax also)  No stimulant laxatives   Had some lower back pain -worse than usual  Used salon pas patches    Patient Active Problem List   Diagnosis Date Noted   Adverse effect of proton pump inhibitor 07/18/2022   Colon cancer screening 07/18/2022   Nasal congestion 04/30/2022   Breast asymmetry 02/21/2022   Muscle twitch 02/21/2022   Abdominal pannus 02/21/2022   Recurrent UTI 12/21/2021   Paresthesia 11/15/2021   Ingrown nail of great toe of left foot 11/15/2021   Keratosis  08/31/2021   Hair loss 06/19/2021   Breast pain 12/20/2020   Encounter for screening mammogram for breast cancer 12/13/2020   Lumbar facet arthropathy 12/29/2019   Chronic bilateral thoracic back pain 12/08/2019   Lumbar degenerative disc disease 12/08/2019   Fibromyalgia 12/08/2019   Chronic pain syndrome 12/08/2019   Lumbar radicular pain (left S1) 12/08/2019   Mid back pain 02/11/2019   Multiple joint pain 11/12/2018   Gallstones 04/19/2017   Diabetic polyneuropathy associated with type 2 diabetes mellitus (HCC) 04/07/2017   Diabetic angiopathy (HCC) 04/07/2017   Vasomotor symptoms due to menopause 04/07/2017   Mobility impaired 11/12/2016   Urinary incontinence 12/02/2015   Candidal intertrigo 04/06/2015   Cystocele 04/06/2015   Constipation 04/06/2015   Myofascial pain syndrome of lumbar spine 12/28/2014   Hypertriglyceridemia 10/14/2014   Abdominal pain 04/23/2012   Uric acid kidney stone 11/20/2011   HYPERHIDROSIS 11/07/2009   Menopausal symptoms 06/17/2009   Type 2 diabetes mellitus without complication, without long-term current use of insulin (HCC) 08/23/2008   Vitamin D deficiency 05/07/2008   Vitamin B 12 deficiency 06/04/2007   CHRONIC FATIGUE SYNDROME 11/15/2006   DIVERTICULITIS, HX OF 11/15/2006   PCOS (polycystic ovarian syndrome) 10/16/2006  Hyperlipidemia associated with type 2 diabetes mellitus (HCC) 10/16/2006   Morbid obesity (HCC) 10/16/2006   Generalized anxiety disorder 10/16/2006   PANIC DISORDER 10/16/2006   BULIMIA 10/16/2006   Chronic depression 10/16/2006   CARPAL TUNNEL SYNDROME 10/16/2006   LYMPHEDEMA 10/16/2006   Allergic rhinitis 10/16/2006   Mild intermittent asthma 10/16/2006   TMJ SYNDROME 10/16/2006   GERD 10/16/2006   Irritable bowel syndrome 10/16/2006   Osteoarthritis 10/16/2006   COUGH, CHRONIC 10/16/2006   Past Medical History:  Diagnosis Date   Allergy    allergic rhinitis   Anemia    Anxiety    Arthritis     osteoarthritis   Asthma    Claustrophobia    does not like oxygen mask covering face   Depression    Diabetes mellitus without complication (HCC)    Fall 2016   Fatty liver 07/2008   abd. ultrasound - fatty liver ; slt dilated cbd (no stones ) 06/10// abd.  ultrasound normal on 04/2006   Fibromyalgia    GERD (gastroesophageal reflux disease)    EGD negative 06/2001// EGD erythematous mucosa, polyp 08/2008   High uric acid in 24 hour urine specimen    Hyperhidrosis    Hyperlipidemia    Kidney stones    Lymphedema    Obesity    PCOS (polycystic ovarian syndrome)    Shortness of breath dyspnea    Tachycardia    TMJ (dislocation of temporomandibular joint)    UTI (lower urinary tract infection)    Past Surgical History:  Procedure Laterality Date   BREAST BIOPSY Right 2016   neg   BREAST LUMPECTOMY WITH RADIOACTIVE SEED LOCALIZATION Right 08/02/2014   Procedure: BREAST LUMPECTOMY WITH RADIOACTIVE SEED LOCALIZATION;  Surgeon: Emelia Loron, MD;  [facility] OR;  Service: General;  Laterality: Right;   COLONOSCOPY  08/12/2012   Completed by Dr. Cecelia Byars, normal exam.    ENDOMETRIAL BIOPSY  01/2004   ESOPHAGOGASTRODUODENOSCOPY     ESOPHAGOGASTRODUODENOSCOPY (EGD) WITH PROPOFOL N/A 08/18/2019   Procedure: ESOPHAGOGASTRODUODENOSCOPY (EGD) WITH PROPOFOL;  Surgeon: Midge Minium, MD;  Location: [facility] ENDOSCOPY;  Service: Endoscopy;  Laterality: N/A;   FOOT SURGERY     SEPTOPLASTY     two times   TONSILLECTOMY     UTERINE FIBROID SURGERY  06/2005   ablation   uterine tumor  09/2001   VAGUS NERVE STIMULATOR INSERTION     Social History   Tobacco Use   Smoking status: Never   Smokeless tobacco: Never  Vaping Use   Vaping Use: Never used  Substance Use Topics   Alcohol use: No    Alcohol/week: 0.0 standard drinks of alcohol   Drug use: No   Family History  Problem Relation Age of Onset   Hypertension Father    Cancer Father        pancreatic cancer   Hypertension Sister     Heart disease Maternal Grandmother 33       MI   Diabetes Maternal Grandmother    Heart disease Paternal Grandmother 46       MI   Diabetes Paternal Grandmother    Breast cancer Neg Hx    Allergies  Allergen Reactions   Cephalexin    Codeine     rash   Duloxetine    Levaquin [Levofloxacin] Nausea Only   Lisinopril Cough   Metformin And Related Diarrhea    GI side effects    Quetiapine Other (See Comments)   Sertraline Hcl     REACTION:  vivid dreams   Triazolam     REACTION: ? reaction   Zaleplon    Zocor [Simvastatin - High Dose]     achey   Zolpidem Tartrate     hallucinations   Zyrtec [Cetirizine]     Sedation    Hydrocodone Itching and Rash    REACTION: redness rash itching   Norelgestromin-Eth Estradiol     Patch didn't stick to skin   Current Outpatient Medications on File Prior to Visit  Medication Sig Dispense Refill   acetaminophen (TYLENOL) 500 MG tablet Take 500 mg by mouth every 6 (six) hours as needed.      albuterol (VENTOLIN HFA) 108 (90 Base) MCG/ACT inhaler INHALE 2 PUFFS INTO THE LUNGS EVERY 6 HOURS AS NEEDED FOR WHEEZING. 18 g 3   ALPRAZolam (XANAX) 1 MG tablet Take 2 mg by mouth at bedtime.     Bacillus Coagulans-Inulin (PROBIOTIC-PREBIOTIC) 1-250 BILLION-MG CAPS Take 1 Capful by mouth daily.     Blood Glucose Monitoring Suppl (FIFTY50 GLUCOSE METER 2.0) w/Device KIT Use as directed     buPROPion (WELLBUTRIN XL) 150 MG 24 hr tablet Take 150 mg by mouth daily.     cyclobenzaprine (FLEXERIL) 5 MG tablet TAKE 1 TABLET BY MOUTH 3 TIMES DAILY AS NEEDED FOR MUSCLE SPASMS 90 tablet 3   desvenlafaxine (PRISTIQ) 100 MG 24 hr tablet Take 100 mg by mouth daily.     dimenhyDRINATE (DRAMAMINE) 50 MG tablet Take 50 mg by mouth every 8 (eight) hours as needed.     docusate sodium (COLACE) 100 MG capsule Take 1 capsule (100 mg total) by mouth 2 (two) times daily. 180 capsule 3   fluticasone (FLONASE) 50 MCG/ACT nasal spray Place into both nostrils daily.      gabapentin (NEURONTIN) 300 MG capsule Take 1 capsule (300 mg total) by mouth at bedtime. 90 capsule 1   losartan (COZAAR) 25 MG tablet      minoxidil (LONITEN) 2.5 MG tablet Take 1 tablet (2.5 mg total) by mouth daily. 90 tablet 1   omeprazole (PRILOSEC) 20 MG capsule TAKE 1 CAPSULE BY MOUTH 2 TIMES DAILY. 180 capsule 1   oxybutynin (DITROPAN XL) 15 MG 24 hr tablet TAKE 1 TABLET BY MOUTH DAILY 90 tablet 1   Polyethyl Glycol-Propyl Glycol (SYSTANE OP) Apply 1 drop to eye daily as needed.     pravastatin (PRAVACHOL) 20 MG tablet 1 TABLET BY MOUTH AT BEDTIME. 90 tablet 1   Simethicone (GAS-X PO) Take by mouth as needed.     spironolactone (ALDACTONE) 100 MG tablet TAKE 1 TABLET BY MOUTH DAILY 90 tablet 1   tirzepatide (MOUNJARO) 10 MG/0.5ML Pen Inject 10 mg into the skin once a week. weekly     traMADol (ULTRAM) 50 MG tablet TAKE ONE TABLET TWICE A DAY IF NEEDED FOR SEVERE PAIN 30 tablet 0   traZODone (DESYREL) 150 MG tablet Take 300 mg by mouth at bedtime.     TRESIBA FLEXTOUCH 100 UNIT/ML FlexTouch Pen Inject 46 Units into the skin daily.     XEOMIN 50 units SOLR injection Inject 50 Units into the muscle every 3 (three) months.     zinc oxide (BALMEX) 11.3 % CREA cream Apply 1 application topically 2 (two) times daily.     No current facility-administered medications on file prior to visit.    Review of Systems  Constitutional:  Positive for fatigue.  Gastrointestinal:  Positive for abdominal distention, abdominal pain, constipation and nausea. Negative for diarrhea, rectal pain and  vomiting.  Psychiatric/Behavioral:  Positive for dysphoric mood. Negative for suicidal ideas. The patient is nervous/anxious.        Objective:   Physical Exam Constitutional:      General: She is not in acute distress.    Appearance: She is well-developed. She is obese. She is not ill-appearing or diaphoretic.  HENT:     Head: Normocephalic and atraumatic.  Eyes:     General: No scleral icterus.     Conjunctiva/sclera: Conjunctivae normal.     Pupils: Pupils are equal, round, and reactive to light.  Cardiovascular:     Rate and Rhythm: Normal rate and regular rhythm.     Heart sounds: Normal heart sounds.  Pulmonary:     Effort: Pulmonary effort is normal. No respiratory distress.     Breath sounds: Normal breath sounds. No wheezing or rales.  Abdominal:     General: Abdomen is protuberant. Bowel sounds are normal. There is no distension.     Palpations: Abdomen is soft. There is no shifting dullness, fluid wave, hepatomegaly, splenomegaly, mass or pulsatile mass.     Tenderness: There is generalized abdominal tenderness. There is no right CVA tenderness, left CVA tenderness, guarding or rebound. Negative signs include Murphy's sign and McBurney's sign.     Comments: Some generalized abd tenderness- worse in Lower abd  No Masses noted     Musculoskeletal:     Cervical back: Normal range of motion and neck supple.  Lymphadenopathy:     Cervical: No cervical adenopathy.  Skin:    General: Skin is warm and dry.     Coloration: Skin is not pale.     Findings: No erythema.  Neurological:     Mental Status: She is alert.  Psychiatric:        Mood and Affect: Mood is depressed. Affect is tearful.           Assessment & Plan:   Problem List Items Addressed This Visit       Digestive   Irritable bowel syndrome    The mounjaro (now at 10 mg) may be worsening her constipation and GI symptoms   Instructed to hold for a week Talk to her endo Continue miralax Occ stimulant laxative is ok but not regularly  Try and get more fluids (fiber as tolerated)  GI referral done       Relevant Orders   Ambulatory referral to Gastroenterology     Other   Constipation    See a/p for IBS Worse with mounjaro Causing some pain in abdomen / improved today   Will continue miralax Lab today  Hold mounjaro for a week      Relevant Orders   Ambulatory referral to  Gastroenterology   Magnesium   Basic metabolic panel   Hepatic function panel   TSH   CBC with Differential/Platelet   Colon cancer screening    Due for colonoscopy in July  Having more abd pain and constipation Referred to GI in [place]       Adverse effect of proton pump inhibitor    B12 and D levels and mag added to labs today      Relevant Orders   Vitamin B12   VITAMIN D 25 Hydroxy (Vit-D Deficiency, Fractures)   Magnesium   CBC with Differential/Platelet   Abdominal pain - Primary    Suspect due to more constipation lately (see IBS note) Reassuring exam Labs ordered  Continue miralax  Hold mounjaro for a week and  report back Discussed ER precautions in detail  GI referral done

## 2022-07-18 NOTE — Progress Notes (Unsigned)
                Romey Cohea, LCSW 

## 2022-07-18 NOTE — Patient Instructions (Addendum)
Hold mounjaro for a week (if you are comfortable doing that)  You may need to take intermittent breaks from it You can discuss further with endocrinologist   Keep up fluids best you can  Keep drinking through the day    Continue miralax (you can use as much as you need)   Magnesium citrate is helpful for severe constipation but only take 1/4 bottle at a time  Reserve this for more severe circumstances  Senekot is helpful as well   Labs today    I will do a referral for GI in [place]  If you don't hear in 1-2 weeks call us

## 2022-07-18 NOTE — Assessment & Plan Note (Addendum)
Suspect due to more constipation lately (see IBS note) Reassuring exam Labs ordered  Continue miralax  Hold mounjaro for a week and report back Discussed ER precautions in detail  GI referral done

## 2022-07-18 NOTE — Assessment & Plan Note (Signed)
B12 and D levels and mag added to labs today

## 2022-07-18 NOTE — Assessment & Plan Note (Signed)
See a/p for IBS Worse with mounjaro Causing some pain in abdomen / improved today   Will continue miralax Lab today  Hold mounjaro for a week

## 2022-07-19 ENCOUNTER — Telehealth: Payer: Self-pay | Admitting: Family Medicine

## 2022-07-19 LAB — HEPATIC FUNCTION PANEL
ALT: 9 U/L (ref 0–35)
AST: 12 U/L (ref 0–37)
Albumin: 4.1 g/dL (ref 3.5–5.2)
Alkaline Phosphatase: 73 U/L (ref 39–117)
Bilirubin, Direct: 0.1 mg/dL (ref 0.0–0.3)
Total Bilirubin: 0.4 mg/dL (ref 0.2–1.2)
Total Protein: 6.7 g/dL (ref 6.0–8.3)

## 2022-07-19 LAB — BASIC METABOLIC PANEL
BUN: 17 mg/dL (ref 6–23)
CO2: 20 mEq/L (ref 19–32)
Calcium: 9.4 mg/dL (ref 8.4–10.5)
Chloride: 105 mEq/L (ref 96–112)
Creatinine, Ser: 1.1 mg/dL (ref 0.40–1.20)
GFR: 55.98 mL/min — ABNORMAL LOW (ref >60.00)
Glucose, Bld: 108 mg/dL — ABNORMAL HIGH (ref 70–99)
Potassium: 4.4 mEq/L (ref 3.5–5.1)
Sodium: 142 mEq/L (ref 135–145)

## 2022-07-19 LAB — MAGNESIUM: Magnesium: 2 mg/dL (ref 1.5–2.5)

## 2022-07-19 NOTE — Telephone Encounter (Signed)
PCP hasn't reviewed them yet. Will route to PCP so she is aware

## 2022-07-19 NOTE — Telephone Encounter (Signed)
Patient contacted the office and requested a call back whenever possible regarding lab results. States she is having issues with mychart. Please advise, thank you

## 2022-07-19 NOTE — Telephone Encounter (Signed)
Just reviewed, see result note

## 2022-07-20 ENCOUNTER — Ambulatory Visit: Payer: Medicare PPO | Admitting: Family Medicine

## 2022-07-24 ENCOUNTER — Ambulatory Visit (INDEPENDENT_AMBULATORY_CARE_PROVIDER_SITE_OTHER): Payer: Medicare PPO | Admitting: Clinical

## 2022-07-24 DIAGNOSIS — F332 Major depressive disorder, recurrent severe without psychotic features: Secondary | ICD-10-CM

## 2022-07-24 DIAGNOSIS — F411 Generalized anxiety disorder: Secondary | ICD-10-CM

## 2022-07-24 NOTE — Progress Notes (Unsigned)
[facility] Counselor/Therapist Progress Note  Patient ID: Mackenzie Bonilla, MRN: [id number],    Date: 07/24/2022  Time Spent: 2:53pm - 3:40pm : 47 minutes   Treatment Type: Individual Therapy  Reported Symptoms: none reported during session  Mental Status Exam: Appearance:  Neat and Well Groomed     Behavior: Appropriate  Motor: Mannerisms  Speech/Language:  Clear and Coherent  Affect: Appropriate  Mood: normal  Thought process: normal  Thought content:   WNL  Sensory/Perceptual disturbances:   WNL  Orientation: oriented to person, place, and situation  Attention: Good  Concentration: Good  Memory: WNL  Fund of knowledge:  Good  Insight:   Fair  Judgment:  Fair  Impulse Control: Fair   Risk Assessment: Danger to Self:   Patient denied current suicidal ideation Self-injurious Behavior: No Danger to Others: No Patient denied current homicidal ideation Duty to Warn:no Physical Aggression / Violence:No  Access to Firearms a concern: No  Gang Involvement:No   Subjective: Patient reported slight improvement in stomach pain since last session. Patient reported she celebrated her birthday with her family. Discussed last session. "It really really bothered me a lot". Discussed ptient's thoughts/eelings regarding las ssession. Felt clinician jumped right to ocnclusions at the beging of session. Was tired and frustrated last session. Issues with trust and reported lasst session impacted patient's trust. Patient shared her thoughts feelings regarding last session and the discussion of SI. Didn't feeling clinicna was not understanding pt and felt frustrated. Doesn't do well in group therapy and has partipcated in outpatient therpay in past. Discussed mandated reported, importance of safety assessment. Felt she wasn't being heard. Was frustrated and angry. Discussed seriuosness of SI statemtns. Feels she would bt too concnetrated on what she says if continuging with  provider. "I dont remember saying all that" re: si. Patient stated, "I'm fine" in response to current mood.   Interventions: Cognitive Behavioral Therapy and psycho education . Clinician conducted session in person at clinician's office at [facility]. Discussed status of recent stressors. Discussed patient's thoughts/feelings in regards to previous therapy session. Provided psycho education related to mandated reporting, safety assessments, and the severity of suicidal statements. Discussed patient's request to be referred to another provider for therapy. Assessed patient's mood.   Last - Clinician conducted session in person at clinician's office at [facility]. Reviewed events since last session. Discussed current stressors and patient's thoughts/feelings in response. Discussed patient's frustrations related to coordinating patient's birthday celebration. Discussed current symptoms and assessed for safety. Discussed the following safety plan with patient and patient's primary care provider, Dr. Roxy Manns, and patient agreed to the following: patient will call her psychiatrist, Dr. Nicholes Stairs [facility] at [facility], [facility], or [facility]. Discussed with patient and provided additional resources for patient to utilize in the event patient experiences suicidal ideation, such as, calling 911, going to a local emergency room, [facility] walk in crisis services, [facility], calling [phone] or [facility] behavioral [facility] crisis line. Patient agreed to call her psychiatrist, Dr. Evelene Croon, to request a more immediate appointment. Clinician offered patient a follow up appointment for tomorrow, June 6th and patient declined the appointment. Patient agreed to a follow up appointment on June 11th at 2:30pm. Clinician discussed with patient participating in Intensive Outpatient treatment and patient declined.     Collaboration of Care:     Diagnosis:  Generalized anxiety disorder   Severe episode of recurrent major depressive disorder,  without psychotic features [facility])      Plan: Patient is to utilize Cognitive Behavioral Therapy, thought re-framing, dialectical behavior therapy skills, and coping strategies to decrease symptoms associated with Generalized Anxiety Disorder and Major Depressive Disorder. Frequency: bi-weekly  Modality: individual      Long-term goal:   Reduce overall level, frequency, and intensity of the symptoms of depression and anxiety as evidenced by decreased difficulty making decisions, feeling anxious, staying at home, nightmares, difficulty staying asleep, racing thoughts, worry about her finances, decreased concentration, restlessness, excessive worry, lack of energy, and increased appetite from 6 to 7 days per week to 0 to 1 days per week per patient's report for at least 3 consecutive months.   Target Date: 12/07/22  Progress: progressing    Short-term goal:  Increase daily care of personal grooming and hygiene as evidenced by showering and getting dressed daily   Target Date: 12/07/22  Progress: progressing    Increase daily activities related to household chores, such as cleaning,  laundry, paying monthly bills Target Date: 12/07/22  Progress: progressing    Write at least one positive affirmation about herself daily   Target Date: 12/07/22  Progress: progressing    Learn and implement social skills to reduce depressive symptoms and build confidence in social interactions   Target Date: 12/07/22  Progress: progressing    Identify negative self talk used to reinforce low self esteem and replace with positive self talk   Target Date: 12/07/22  Progress: progressing    Increase participation in social opportunities from 0 days per week to 1 days per week   Target Date: 12/07/22  Progress: progressing    Decrease feelings of anger, depression, and anxiety by developing healthy  coping mechanisms   Target Date: 12/07/22  Progress: progressing     Doree Barthel, LCSW                 Doree Barthel, [place]

## 2022-08-02 DIAGNOSIS — H6123 Impacted cerumen, bilateral: Secondary | ICD-10-CM | POA: Diagnosis not present

## 2022-08-02 DIAGNOSIS — H902 Conductive hearing loss, unspecified: Secondary | ICD-10-CM | POA: Diagnosis not present

## 2022-08-07 DIAGNOSIS — E1142 Type 2 diabetes mellitus with diabetic polyneuropathy: Secondary | ICD-10-CM | POA: Diagnosis not present

## 2022-08-12 DIAGNOSIS — E119 Type 2 diabetes mellitus without complications: Secondary | ICD-10-CM | POA: Diagnosis not present

## 2022-08-12 NOTE — Progress Notes (Unsigned)
    Mackenzie Bonilla T. Mackenzie Rosman, MD, CAQ Sports Medicine [facility] [address] [place], [address]  Phone: [phone]  FAX: [phone]  Mackenzie Bonilla - 57 y.o. female  MRN [id number]  Date of Birth: [birth year]  Date: 08/13/2022  PCP: Mackenzie Pimple, MD  Referral: Mackenzie Pimple, MD  No chief complaint on file.  Subjective:   Mackenzie Bonilla is a 57 y.o. very pleasant female patient with There is no height or weight on file to calculate BMI. who presents with the following:  She is here to follow-up on some ongoing arm pain and numbness.  She has seen both me and neurology in the past, and felt like she had left-sided cubital tunnel the last time she was here in the office.  Neurology felt this was the case, as well.  I did place her in a cubital tunnel extension splint, and she was able to tolerate this to some degree, but then she called down and was having trouble with that slipping on her elbow.  We decided to discontinue this, see if she had been doing better.  She was also having some relatively minor neck pain at the first time she came in the office for this.    Review of Systems is noted in the HPI, as appropriate  Objective:   There were no vitals taken for this visit.  GEN: No acute distress; alert,appropriate. PULM: Breathing comfortably in no respiratory distress PSYCH: Normally interactive.   Laboratory and Imaging Data:  Assessment and Plan:   ***

## 2022-08-13 ENCOUNTER — Ambulatory Visit: Payer: Medicare PPO | Admitting: Family Medicine

## 2022-08-13 ENCOUNTER — Encounter: Payer: Self-pay | Admitting: Family Medicine

## 2022-08-13 VITALS — BP 102/60 | HR 80 | Temp 97.7°F | Ht 64.0 in | Wt 261.0 lb

## 2022-08-13 DIAGNOSIS — R29898 Other symptoms and signs involving the musculoskeletal system: Secondary | ICD-10-CM

## 2022-08-13 DIAGNOSIS — G5622 Lesion of ulnar nerve, left upper limb: Secondary | ICD-10-CM | POA: Diagnosis not present

## 2022-08-13 DIAGNOSIS — R202 Paresthesia of skin: Secondary | ICD-10-CM | POA: Diagnosis not present

## 2022-08-13 DIAGNOSIS — M542 Cervicalgia: Secondary | ICD-10-CM | POA: Diagnosis not present

## 2022-08-13 DIAGNOSIS — R200 Anesthesia of skin: Secondary | ICD-10-CM | POA: Diagnosis not present

## 2022-08-14 ENCOUNTER — Encounter: Payer: Self-pay | Admitting: Family Medicine

## 2022-08-14 ENCOUNTER — Telehealth: Payer: Self-pay | Admitting: Family Medicine

## 2022-08-14 ENCOUNTER — Encounter: Payer: Self-pay | Admitting: *Deleted

## 2022-08-14 NOTE — Telephone Encounter (Signed)
Mackenzie Bonilla notified as instructed by telephone.  Patient states understanding.  

## 2022-08-14 NOTE — Telephone Encounter (Signed)
Pt called stating she found a thumb splint that also covers her wrist. Pt asked when does she need to wear it, day & night or just day? Please advise. Call back # [phone]

## 2022-08-20 DIAGNOSIS — Z8659 Personal history of other mental and behavioral disorders: Secondary | ICD-10-CM | POA: Diagnosis not present

## 2022-08-20 DIAGNOSIS — G5603 Carpal tunnel syndrome, bilateral upper limbs: Secondary | ICD-10-CM | POA: Diagnosis not present

## 2022-08-20 DIAGNOSIS — R29810 Facial weakness: Secondary | ICD-10-CM | POA: Diagnosis not present

## 2022-08-21 ENCOUNTER — Telehealth: Payer: Self-pay | Admitting: Family Medicine

## 2022-08-21 NOTE — Telephone Encounter (Signed)
Patient called in stating that she fell on Sunday,and hurt both of her arms.She has been wearing the splint issued to her however when he wakes up she is in a lot of pain. She would like advice as to if she need to continue to wear the splint or could she take a couple days of from wearing it? She thinks it is causing her more pain to wear it.

## 2022-08-21 NOTE — Telephone Encounter (Signed)
Mackenzie Bonilla notified by telephone that is is okay to take a break from the wrist splints for a few days per Dr. Patsy Lager.  Patient states understanding.

## 2022-08-23 ENCOUNTER — Other Ambulatory Visit: Payer: Self-pay

## 2022-08-23 ENCOUNTER — Ambulatory Visit: Payer: Medicare PPO | Admitting: Physician Assistant

## 2022-08-23 ENCOUNTER — Encounter: Payer: Self-pay | Admitting: Physician Assistant

## 2022-08-23 VITALS — BP 104/73 | HR 91 | Temp 98.7°F | Ht 64.0 in | Wt 260.4 lb

## 2022-08-23 DIAGNOSIS — K581 Irritable bowel syndrome with constipation: Secondary | ICD-10-CM | POA: Diagnosis not present

## 2022-08-23 DIAGNOSIS — Z1211 Encounter for screening for malignant neoplasm of colon: Secondary | ICD-10-CM | POA: Diagnosis not present

## 2022-08-23 DIAGNOSIS — R1310 Dysphagia, unspecified: Secondary | ICD-10-CM | POA: Diagnosis not present

## 2022-08-23 MED ORDER — PEG 3350-KCL-NA BICARB-NACL 420 G PO SOLR: 4000.0000 mL | Freq: Once | ORAL | 0 refills | Status: AC

## 2022-08-23 NOTE — Progress Notes (Signed)
Celso Amy, PA-C [address]  [address]  [place], [place] [address]  Main: [phone]  Fax: [phone]   Primary Care Physician: Tower, Audrie Gallus, MD  Primary Gastroenterologist:  Celso Amy, PA-C / Dr. Midge Minium    CC: Irritable bowel syndrome  HPI: Mackenzie Bonilla is a 57 y.o. female presents for evaluation of constipation, recurrent dysphagia, and schedule 10-year repeat screening colonoscopy.  She has had chronic constipation and IBS symptoms for many years.  A few months ago her PCP started her on MiraLAX 1 capful daily and stool softener daily with benefit.  Constipation has improved on treatment.  She has been on [facility] for 3 years and has lost 75 pounds.  Constipation worsened on high-dose Mounjaro.  She is able to tolerate medium dose 12.5 Mg currently.  She states she is having some difficulty swallowing some of her large pills/medication.  Also feels like bread and chicken get stuck in her throat when she tries to swallow.  She has dry mouth.  Denies food bolus or vomiting episodes.  She previously saw Dr. Servando Snare for dysphagia in 2021. EGD 08/2019 showed mild gastritis, otherwise normal.  Empiric esophageal dilation performed to 18 mm.  Gastric biopsies negative for H. pylori.  She saw Dr. Allegra Lai for IBS in 2019.  Colonoscopy 08/2012 was normal.  10-year repeat screening is due.   Current Outpatient Medications  Medication Sig Dispense Refill   acetaminophen (TYLENOL) 500 MG tablet Take 500 mg by mouth every 6 (six) hours as needed.      albuterol (VENTOLIN HFA) 108 (90 Base) MCG/ACT inhaler INHALE 2 PUFFS INTO THE LUNGS EVERY 6 HOURS AS NEEDED FOR WHEEZING. 18 g 3   ALPRAZolam (XANAX) 1 MG tablet Take 2 mg by mouth at bedtime.     Bacillus Coagulans-Inulin (PROBIOTIC-PREBIOTIC) 1-250 BILLION-MG CAPS Take 1 Capful by mouth daily.     Blood Glucose Monitoring Suppl (FIFTY50 GLUCOSE METER 2.0) w/Device KIT Use as directed     buPROPion (WELLBUTRIN XL) 150 MG  24 hr tablet Take 150 mg by mouth daily.     cyclobenzaprine (FLEXERIL) 5 MG tablet TAKE 1 TABLET BY MOUTH 3 TIMES DAILY AS NEEDED FOR MUSCLE SPASMS 90 tablet 3   desvenlafaxine (PRISTIQ) 100 MG 24 hr tablet Take 100 mg by mouth daily.     dimenhyDRINATE (DRAMAMINE) 50 MG tablet Take 50 mg by mouth every 8 (eight) hours as needed.     docusate sodium (COLACE) 100 MG capsule Take 1 capsule (100 mg total) by mouth 2 (two) times daily. 180 capsule 3   fluticasone (FLONASE) 50 MCG/ACT nasal spray Place into both nostrils daily.     gabapentin (NEURONTIN) 100 MG capsule Take 100 mg by mouth in the morning.     gabapentin (NEURONTIN) 300 MG capsule Take 1 capsule (300 mg total) by mouth at bedtime. 90 capsule 1   losartan (COZAAR) 25 MG tablet      minoxidil (LONITEN) 2.5 MG tablet Take 1 tablet (2.5 mg total) by mouth daily. 90 tablet 1   MOUNJARO 12.5 MG/0.5ML Pen Inject 12.5 mg into the skin once a week.     omeprazole (PRILOSEC) 20 MG capsule TAKE 1 CAPSULE BY MOUTH 2 TIMES DAILY. 180 capsule 1   oxybutynin (DITROPAN XL) 15 MG 24 hr tablet TAKE 1 TABLET BY MOUTH DAILY 90 tablet 1   Polyethyl Glycol-Propyl Glycol (SYSTANE OP) Apply 1 drop to eye daily as needed.     pravastatin (PRAVACHOL)  20 MG tablet 1 TABLET BY MOUTH AT BEDTIME. 90 tablet 1   Simethicone (GAS-X PO) Take by mouth as needed.     spironolactone (ALDACTONE) 100 MG tablet TAKE 1 TABLET BY MOUTH DAILY 90 tablet 1   traMADol (ULTRAM) 50 MG tablet TAKE ONE TABLET TWICE A DAY IF NEEDED FOR SEVERE PAIN 30 tablet 0   traZODone (DESYREL) 150 MG tablet Take 300 mg by mouth at bedtime.     TRESIBA FLEXTOUCH 100 UNIT/ML FlexTouch Pen Inject 46 Units into the skin daily.     XEOMIN 50 units SOLR injection Inject 50 Units into the muscle every 3 (three) months.     zinc oxide (BALMEX) 11.3 % CREA cream Apply 1 application topically 2 (two) times daily.     No current facility-administered medications for this visit.    Allergies as of  08/23/2022 - Review Complete 08/23/2022  Allergen Reaction Noted   Cephalexin  10/16/2006   Codeine  10/16/2006   Duloxetine  10/16/2006   Levaquin [levofloxacin] Nausea Only 12/25/2011   Lisinopril Cough 01/16/2017   Metformin and related Diarrhea 11/14/2011   Quetiapine Other (See Comments) 10/16/2006   Sertraline hcl     Triazolam  10/16/2006   Zaleplon  10/16/2006   Zocor [simvastatin - high dose]  07/05/2010   Zolpidem tartrate  10/16/2006   Zyrtec [cetirizine]  11/15/2021   Hydrocodone Itching and Rash 10/16/2006   Norelgestromin-eth estradiol  10/16/2006    Past Medical History:  Diagnosis Date   Allergy    allergic rhinitis   Anemia    Anxiety    Arthritis    osteoarthritis   Asthma    Claustrophobia    does not like oxygen mask covering face   Depression    Diabetes mellitus without complication (HCC)    Fall 2016   Fatty liver 07/2008   abd. ultrasound - fatty liver ; slt dilated cbd (no stones ) 06/10// abd.  ultrasound normal on 04/2006   Fibromyalgia    GERD (gastroesophageal reflux disease)    EGD negative 06/2001// EGD erythematous mucosa, polyp 08/2008   High uric acid in 24 hour urine specimen    Hyperhidrosis    Hyperlipidemia    Kidney stones    Lymphedema    Obesity    PCOS (polycystic ovarian syndrome)    Shortness of breath dyspnea    Tachycardia    TMJ (dislocation of temporomandibular joint)    UTI (lower urinary tract infection)     Past Surgical History:  Procedure Laterality Date   BREAST BIOPSY Right 2016   neg   BREAST LUMPECTOMY WITH RADIOACTIVE SEED LOCALIZATION Right 08/02/2014   Procedure: BREAST LUMPECTOMY WITH RADIOACTIVE SEED LOCALIZATION;  Surgeon: Emelia Loron, MD;  Location: [facility] OR;  Service: General;  Laterality: Right;   COLONOSCOPY  08/12/2012   Completed by Dr. Cecelia Byars, normal exam.    ENDOMETRIAL BIOPSY  01/2004   ESOPHAGOGASTRODUODENOSCOPY     ESOPHAGOGASTRODUODENOSCOPY (EGD) WITH PROPOFOL N/A 08/18/2019    Procedure: ESOPHAGOGASTRODUODENOSCOPY (EGD) WITH PROPOFOL;  Surgeon: Midge Minium, MD;  Location: [facility] ENDOSCOPY;  Service: Endoscopy;  Laterality: N/A;   FOOT SURGERY     SEPTOPLASTY     two times   TONSILLECTOMY     UTERINE FIBROID SURGERY  06/2005   ablation   uterine tumor  09/2001   VAGUS NERVE STIMULATOR INSERTION      Review of Systems:    All systems reviewed and negative except where noted in HPI.  Physical Examination:   BP 104/73   Pulse 91   Temp 98.7 F (37.1 C)   Ht 5\' 4"  (1.626 m)   Wt 260 lb 6.4 oz (118.1 kg)   BMI 44.70 kg/m   General: Well-nourished, well-developed in no acute distress. Obese.  Walks with a cane. Eyes: No icterus. Conjunctivae pink. Mouth: Oropharyngeal mucosa moist and pink , no lesions erythema or exudate. Lungs: Clear to auscultation bilaterally. Non-labored. Heart: Regular rate and rhythm, no murmurs rubs or gallops.  Abdomen: Bowel sounds are normal; Abdomen is Soft; No hepatosplenomegaly, masses or hernias;  No Abdominal Tenderness; No guarding or rebound tenderness. Extremities: No lower extremity edema. No clubbing or deformities. Neuro: Alert and oriented x 3.  Grossly intact. Skin: Warm and dry, no jaundice.   Psych: Alert and cooperative, normal mood and affect.   Imaging Studies: No results found.  Assessment and Plan:   Mackenzie Bonilla is a 57 y.o. y/o female presents for:  1.  IBS, constipation predominant  Continue Miralax and Stool Softener Daily.  2.  Dysphagia  Barium Swallow with tab  Scheduling EGD I discussed risks of EGD with patient to include risk of bleeding, perforation, and risk of sedation.  Patient expressed understanding and agrees to proceed with EGD.   3.  Colon cancer screening  Scheduling Colonoscopy I discussed risks of colonoscopy with patient to include risk of bleeding, colon perforation, and risk of sedation.  Patient expressed understanding and agrees to proceed with colonoscopy.      Celso Amy, PA-C  Follow up 4 weeks after procedures with TG.

## 2022-08-23 NOTE — Patient Instructions (Signed)
Barium swallow 08/29/22 @ 10;30 am @  [facility].

## 2022-08-27 ENCOUNTER — Telehealth: Payer: Self-pay

## 2022-08-27 ENCOUNTER — Telehealth: Payer: Self-pay | Admitting: Physician Assistant

## 2022-08-27 ENCOUNTER — Other Ambulatory Visit: Payer: Self-pay

## 2022-08-27 NOTE — Telephone Encounter (Signed)
Patient called in because she has questions about her procedure, and she stated that she would like to speak with Mrs. Inetta Fermo nurse. She stated that she would love to have a call back today.

## 2022-08-27 NOTE — Telephone Encounter (Signed)
Discussed prep instructions with patient and she verbalized understanding.

## 2022-08-29 ENCOUNTER — Ambulatory Visit
Admission: RE | Admit: 2022-08-29 | Discharge: 2022-08-29 | Disposition: A | Discharge status: Home / Self Care | Payer: Medicare PPO | Source: Ambulatory Visit | Attending: Physician Assistant | Admitting: Physician Assistant

## 2022-08-29 ENCOUNTER — Other Ambulatory Visit: Payer: Self-pay | Admitting: Physician Assistant

## 2022-08-29 DIAGNOSIS — R1310 Dysphagia, unspecified: Secondary | ICD-10-CM

## 2022-08-29 DIAGNOSIS — Z1211 Encounter for screening for malignant neoplasm of colon: Secondary | ICD-10-CM

## 2022-08-29 DIAGNOSIS — K581 Irritable bowel syndrome with constipation: Secondary | ICD-10-CM

## 2022-08-29 DIAGNOSIS — K219 Gastro-esophageal reflux disease without esophagitis: Secondary | ICD-10-CM | POA: Diagnosis not present

## 2022-08-29 DIAGNOSIS — R200 Anesthesia of skin: Secondary | ICD-10-CM | POA: Diagnosis not present

## 2022-08-29 NOTE — Progress Notes (Signed)
Notify patient barium swallow with tablet showed moderate acid reflux to the mid upper esophagus.  Mild esophageal spasm.  No evidence of hiatal hernia or esophageal stricture.  Patient is currently taking Prilosec 20 Mg twice daily for acid reflux.  I recommend increase Prilosec (omeprazole) to 40 mg twice daily, #60, 3 refills to see if this helps her symptoms.  Continue with plan for EGD and colonoscopy as scheduled.  Follow-up OV after procedures.

## 2022-08-30 ENCOUNTER — Telehealth: Payer: Self-pay

## 2022-08-30 MED ORDER — OMEPRAZOLE 40 MG PO CPDR: 40.0000 mg | DELAYED_RELEASE_CAPSULE | Freq: Two times a day (BID) | ORAL | 3 refills | Status: DC

## 2022-08-30 NOTE — Telephone Encounter (Signed)
Patient notified. RX sent to pharmacy.  Notify patient barium swallow with tablet showed moderate acid reflux to the mid upper esophagus.  Mild esophageal spasm.  No evidence of hiatal hernia or esophageal stricture.  Patient is currently taking Prilosec 20 Mg twice daily for acid reflux.  I recommend increase Prilosec (omeprazole) to 40 mg twice daily, #60, 3 refills to see if this helps her symptoms.  Continue with plan for EGD and colonoscopy as scheduled.  Follow-up OV after procedures.

## 2022-09-03 ENCOUNTER — Telehealth: Payer: Self-pay | Admitting: Family Medicine

## 2022-09-03 MED ORDER — KETOCONAZOLE 2 % EX CREA: 1.0000 | TOPICAL_CREAM | Freq: Every day | CUTANEOUS | 0 refills | Status: DC | PRN

## 2022-09-03 NOTE — Telephone Encounter (Signed)
Pt notified of Dr. Royden Purl comments. Rx sent to pharmacy and pt will keep [place] posted

## 2022-09-03 NOTE — Telephone Encounter (Signed)
Patient called to leave a message to request a call back whenever possible. Patient states she noticed an irritated area where her hip and leg meet. States it seems like it was done by friction from either her clothes or skin rubbing against skin. Says it is irritated and red in the area and is painful to touch. Patient says she is using antibiotic ointment on it and wanted to know if there is something else she could be doing? States she had something similar before and it got infected, and she does not want it to get this bad again. Patient can be reached at home number.

## 2022-09-03 NOTE — Telephone Encounter (Signed)
She has been prone to yeast in skin folds in the past  Try and keep very clean (soap and water) and dry area very very well (hair dryer on cool can help also) Then try ketoconazole cream (she may have some but not sure) 2% -apply once daily  (send in 30 g with no refills if needed)   If sweaty-dry area frequently and change clothes if needed   If worse or no improvement follow up in office so we can take a look

## 2022-09-04 NOTE — Telephone Encounter (Signed)
Patient called again, stated she is concerned about this area. Wanted to go ahead and make appointment with [facility] to check this spot out. Made OV 7/24

## 2022-09-05 ENCOUNTER — Ambulatory Visit: Payer: Medicare PPO | Admitting: Family Medicine

## 2022-09-05 ENCOUNTER — Encounter: Payer: Self-pay | Admitting: Family Medicine

## 2022-09-05 VITALS — BP 128/60 | HR 73 | Temp 97.6°F | Ht 64.0 in | Wt 262.0 lb

## 2022-09-05 DIAGNOSIS — B372 Candidiasis of skin and nail: Secondary | ICD-10-CM | POA: Diagnosis not present

## 2022-09-05 DIAGNOSIS — T148XXA Other injury of unspecified body region, initial encounter: Secondary | ICD-10-CM

## 2022-09-05 NOTE — Assessment & Plan Note (Addendum)
Pt has lost substantial weight on mounjaro for dm2  Frustrated after gaining some weight back eating cake/dessert at a family event and some pretzels Identifies food cravings and tastes hard to control (sugar, carbs) Needs to restrict access if she feels hard to control situation and do not buy products   Interested in nutritionist if covered by insurance and will let us know   Low glycemic diet and strength training exercise is recommended as tolerated   22  Minutes were spent today both face to face and in the chart obtaining history, reviewing records ,  performing exam , educating and discussing treatment options, (mounjaro and dietary changes)  and discussing future referral if necessary (nutrition)

## 2022-09-05 NOTE — Assessment & Plan Note (Addendum)
Small tear (from friction) in a skin fold- posterior left abdomen/hip area  Possibly due to wearing a pr of tight jeans one day  No signs of infection  Recommend  Soap and water cleanse Antibiotic oint over the counter and if needed a non stick (tefla) gauze tucked into the area (used this today) Leave open to air in clean conditions Avoid trauma to the area  Update if not starting to improve in a week or if worsening  Rev signs and symptoms of infection to watch for  Call back and Er precautions noted in detail today  12  Minutes were spent today both face to face and in the chart obtaining history, placing small dressing , reviewing wound care and discussing callback precautions

## 2022-09-05 NOTE — Patient Instructions (Signed)
Keep the skin tear clean with soap and water   Antibiotic ointment as needed  You can use a non stick pad to protect clothing   Stop the ketoconazole cream (I don't see yeast)   Watch for increased redness or swelling or pain or drainage  This will take a while to heal  Protect from trauma/ tight clothing   Keep [place] posted

## 2022-09-05 NOTE — Assessment & Plan Note (Signed)
This seems to be improved with weight loss

## 2022-09-05 NOTE — Progress Notes (Signed)
Subjective:    Patient ID: Mackenzie Bonilla, female    DOB: 1965/03/06, 57 y.o.   MRN: [id number]  HPI  Wt Readings from Last 3 Encounters:  09/05/22 262 lb (118.8 kg)  08/23/22 260 lb 6.4 oz (118.1 kg)  08/13/22 261 lb (118.4 kg)   44.97 kg/m  Vitals:   09/05/22 1155  BP: 128/60  Pulse: 73  Temp: 97.6 F (36.4 C)  SpO2: 96%    Pt presents with c/o skin injury/tear (with history of intertrigo) Also obesity/nutrition questions   Area is higher than in past -closer to the hip  In a skin fold  She had worn some jeans (tighter than usual) -they rubbed an area in conjunction with underwear  May have rubbed an area raw  Has bled a little bit   Used ketoconazole cream- and it burned  (it did expire in February)    Did loose down to 255 Gained after eating some cake at wedding and pretzels   Went on pretzel binge Did buy candy bars Unable to control sweets intake once she gets a bite  Mounjaro is helpful  Feels shameful/ self anger after a binge Overall frustrated  Continues psychiatric care  Overall pleased with weight loss from mounjaro/ also better dm control   Intertrigo is less of a problem with weight loss Also more mobile   Patient Active Problem List   Diagnosis Date Noted   Nontraumatic tear of skin 09/05/2022   Adverse effect of proton pump inhibitor 07/18/2022   Colon cancer screening 07/18/2022   Nasal congestion 04/30/2022   Breast asymmetry 02/21/2022   Muscle twitch 02/21/2022   Abdominal pannus 02/21/2022   Recurrent UTI 12/21/2021   Paresthesia 11/15/2021   Ingrown nail of great toe of left foot 11/15/2021   Keratosis 08/31/2021   Hair loss 06/19/2021   Breast pain 12/20/2020   Encounter for screening mammogram for breast cancer 12/13/2020   Lumbar facet arthropathy 12/29/2019   Chronic bilateral thoracic back pain 12/08/2019   Lumbar degenerative disc disease 12/08/2019   Fibromyalgia 12/08/2019   Chronic pain syndrome 12/08/2019    Lumbar radicular pain (left S1) 12/08/2019   Mid back pain 02/11/2019   Multiple joint pain 11/12/2018   Gallstones 04/19/2017   Diabetic polyneuropathy associated with type 2 diabetes mellitus (HCC) 04/07/2017   Diabetic angiopathy (HCC) 04/07/2017   Vasomotor symptoms due to menopause 04/07/2017   Mobility impaired 11/12/2016   Urinary incontinence 12/02/2015   Candidal intertrigo 04/06/2015   Cystocele 04/06/2015   Constipation 04/06/2015   Myofascial pain syndrome of lumbar spine 12/28/2014   Hypertriglyceridemia 10/14/2014   Abdominal pain 04/23/2012   Uric acid kidney stone 11/20/2011   HYPERHIDROSIS 11/07/2009   Menopausal symptoms 06/17/2009   Type 2 diabetes mellitus without complication, without long-term current use of insulin (HCC) 08/23/2008   Vitamin D deficiency 05/07/2008   Vitamin B 12 deficiency 06/04/2007   CHRONIC FATIGUE SYNDROME 11/15/2006   DIVERTICULITIS, HX OF 11/15/2006   PCOS (polycystic ovarian syndrome) 10/16/2006   Hyperlipidemia associated with type 2 diabetes mellitus (HCC) 10/16/2006   Morbid obesity (HCC) 10/16/2006   Generalized anxiety disorder 10/16/2006   PANIC DISORDER 10/16/2006   BULIMIA 10/16/2006   Chronic depression 10/16/2006   CARPAL TUNNEL SYNDROME 10/16/2006   LYMPHEDEMA 10/16/2006   Allergic rhinitis 10/16/2006   Mild intermittent asthma 10/16/2006   TMJ SYNDROME 10/16/2006   GERD 10/16/2006   Irritable bowel syndrome 10/16/2006   Osteoarthritis 10/16/2006   COUGH, CHRONIC 10/16/2006  Past Medical History:  Diagnosis Date   Allergy    allergic rhinitis   Anemia    Anxiety    Arthritis    osteoarthritis   Asthma    Claustrophobia    does not like oxygen mask covering face   Depression    Diabetes mellitus without complication (HCC)    Fall 2016   Fatty liver 07/2008   abd. ultrasound - fatty liver ; slt dilated cbd (no stones ) 06/10// abd.  ultrasound normal on 04/2006   Fibromyalgia    GERD  (gastroesophageal reflux disease)    EGD negative 06/2001// EGD erythematous mucosa, polyp 08/2008   High uric acid in 24 hour urine specimen    Hyperhidrosis    Hyperlipidemia    Kidney stones    Lymphedema    Obesity    PCOS (polycystic ovarian syndrome)    Shortness of breath dyspnea    Tachycardia    TMJ (dislocation of temporomandibular joint)    UTI (lower urinary tract infection)    Past Surgical History:  Procedure Laterality Date   BREAST BIOPSY Right 2016   neg   BREAST LUMPECTOMY WITH RADIOACTIVE SEED LOCALIZATION Right 08/02/2014   Procedure: BREAST LUMPECTOMY WITH RADIOACTIVE SEED LOCALIZATION;  Surgeon: Emelia Loron, MD;  Location: [facility] OR;  Service: General;  Laterality: Right;   COLONOSCOPY  08/12/2012   Completed by Dr. Cecelia Byars, normal exam.    ENDOMETRIAL BIOPSY  01/2004   ESOPHAGOGASTRODUODENOSCOPY     ESOPHAGOGASTRODUODENOSCOPY (EGD) WITH PROPOFOL N/A 08/18/2019   Procedure: ESOPHAGOGASTRODUODENOSCOPY (EGD) WITH PROPOFOL;  Surgeon: Midge Minium, MD;  Location: [facility] ENDOSCOPY;  Service: Endoscopy;  Laterality: N/A;   FOOT SURGERY     SEPTOPLASTY     two times   TONSILLECTOMY     UTERINE FIBROID SURGERY  06/2005   ablation   uterine tumor  09/2001   VAGUS NERVE STIMULATOR INSERTION     Social History   Tobacco Use   Smoking status: Never   Smokeless tobacco: Never  Vaping Use   Vaping status: Never Used  Substance Use Topics   Alcohol use: No    Alcohol/week: 0.0 standard drinks of alcohol   Drug use: No   Family History  Problem Relation Age of Onset   Hypertension Father    Cancer Father        pancreatic cancer   Hypertension Sister    Heart disease Maternal Grandmother 73       MI   Diabetes Maternal Grandmother    Heart disease Paternal Grandmother 21       MI   Diabetes Paternal Grandmother    Breast cancer Neg Hx    Allergies  Allergen Reactions   Cephalexin    Codeine     rash   Duloxetine    Levaquin [Levofloxacin] Nausea  Only   Lisinopril Cough   Metformin And Related Diarrhea    GI side effects    Quetiapine Other (See Comments)   Sertraline Hcl     REACTION: vivid dreams   Triazolam     REACTION: ? reaction   Zaleplon    Zocor [Simvastatin - High Dose]     achey   Zolpidem Tartrate     hallucinations   Zyrtec [Cetirizine]     Sedation    Hydrocodone Itching and Rash    REACTION: redness rash itching   Norelgestromin-Eth Estradiol     Patch didn't stick to skin   Current Outpatient Medications on File Prior to  Visit  Medication Sig Dispense Refill   acetaminophen (TYLENOL) 500 MG tablet Take 500 mg by mouth every 6 (six) hours as needed.      albuterol (VENTOLIN HFA) 108 (90 Base) MCG/ACT inhaler INHALE 2 PUFFS INTO THE LUNGS EVERY 6 HOURS AS NEEDED FOR WHEEZING. 18 g 3   ALPRAZolam (XANAX) 1 MG tablet Take 2 mg by mouth at bedtime.     Bacillus Coagulans-Inulin (PROBIOTIC-PREBIOTIC) 1-250 BILLION-MG CAPS Take 1 Capful by mouth daily.     Blood Glucose Monitoring Suppl (FIFTY50 GLUCOSE METER 2.0) w/Device KIT Use as directed     buPROPion (WELLBUTRIN XL) 150 MG 24 hr tablet Take 150 mg by mouth daily.     cyclobenzaprine (FLEXERIL) 5 MG tablet TAKE 1 TABLET BY MOUTH 3 TIMES DAILY AS NEEDED FOR MUSCLE SPASMS 90 tablet 3   desvenlafaxine (PRISTIQ) 100 MG 24 hr tablet Take 100 mg by mouth daily.     dimenhyDRINATE (DRAMAMINE) 50 MG tablet Take 50 mg by mouth every 8 (eight) hours as needed.     docusate sodium (COLACE) 100 MG capsule Take 1 capsule (100 mg total) by mouth 2 (two) times daily. 180 capsule 3   fluticasone (FLONASE) 50 MCG/ACT nasal spray Place into both nostrils daily.     gabapentin (NEURONTIN) 100 MG capsule Take 100 mg by mouth in the morning.     gabapentin (NEURONTIN) 300 MG capsule Take 1 capsule (300 mg total) by mouth at bedtime. 90 capsule 1   ketoconazole (NIZORAL) 2 % cream Apply 1 Application topically daily as needed for irritation. 30 g 0   losartan (COZAAR) 25 MG  tablet      minoxidil (LONITEN) 2.5 MG tablet Take 1 tablet (2.5 mg total) by mouth daily. 90 tablet 1   MOUNJARO 12.5 MG/0.5ML Pen Inject 12.5 mg into the skin once a week.     omeprazole (PRILOSEC) 40 MG capsule Take 1 capsule (40 mg total) by mouth in the morning and at bedtime. 60 capsule 3   oxybutynin (DITROPAN XL) 15 MG 24 hr tablet TAKE 1 TABLET BY MOUTH DAILY 90 tablet 1   Polyethyl Glycol-Propyl Glycol (SYSTANE OP) Apply 1 drop to eye daily as needed.     pravastatin (PRAVACHOL) 20 MG tablet 1 TABLET BY MOUTH AT BEDTIME. 90 tablet 1   Simethicone (GAS-X PO) Take by mouth as needed.     spironolactone (ALDACTONE) 100 MG tablet TAKE 1 TABLET BY MOUTH DAILY 90 tablet 1   traMADol (ULTRAM) 50 MG tablet TAKE ONE TABLET TWICE A DAY IF NEEDED FOR SEVERE PAIN 30 tablet 0   traZODone (DESYREL) 150 MG tablet Take 300 mg by mouth at bedtime.     TRESIBA FLEXTOUCH 100 UNIT/ML FlexTouch Pen Inject 46 Units into the skin daily.     XEOMIN 50 units SOLR injection Inject 50 Units into the muscle every 3 (three) months.     zinc oxide (BALMEX) 11.3 % CREA cream Apply 1 application topically 2 (two) times daily.     No current facility-administered medications on file prior to visit.    Review of Systems  Constitutional:  Positive for unexpected weight change. Negative for activity change, appetite change, fatigue and fever.  HENT:  Negative for congestion, ear pain, rhinorrhea, sinus pressure and sore throat.   Eyes:  Negative for pain, redness and visual disturbance.  Respiratory:  Negative for cough, shortness of breath and wheezing.   Cardiovascular:  Negative for chest pain and palpitations.  Gastrointestinal:  Negative for abdominal pain, blood in stool, constipation and diarrhea.  Endocrine: Negative for polydipsia and polyuria.  Genitourinary:  Negative for dysuria, frequency and urgency.  Musculoskeletal:  Negative for arthralgias, back pain and myalgias.  Skin:  Positive for wound.  Negative for pallor and rash.  Allergic/Immunologic: Negative for environmental allergies.  Neurological:  Negative for dizziness, syncope and headaches.  Hematological:  Negative for adenopathy. Does not bruise/bleed easily.  Psychiatric/Behavioral:  Positive for dysphoric mood. Negative for decreased concentration. The patient is nervous/anxious.        Objective:   Physical Exam Constitutional:      General: She is not in acute distress.    Appearance: Normal appearance. She is obese. She is not ill-appearing.  Eyes:     Conjunctiva/sclera: Conjunctivae normal.     Pupils: Pupils are equal, round, and reactive to light.  Cardiovascular:     Rate and Rhythm: Normal rate.  Pulmonary:     Effort: Pulmonary effort is normal. No respiratory distress.  Musculoskeletal:     Comments:     Skin:    Coloration: Skin is not pale.     Findings: No bruising, erythema or rash.     Comments: Fair complexion   2-3 cm linear skin tear in fold of abdomen/hip on right (posterior) Clean with no erythema or drainage or odor  Some tenderness No streaking   No signs of candidal infection   Neurological:     Mental Status: She is alert.     Sensory: No sensory deficit.  Psychiatric:        Attention and Perception: Attention normal.        Mood and Affect: Affect is labile and tearful.        Cognition and Memory: Cognition and memory normal.     Comments: Tearful at times but overall less anxious and depressed seeming than last visit   Candidly discusses symptoms and stressors             Assessment & Plan:   Problem List Items Addressed This Visit       Musculoskeletal and Integument   Nontraumatic tear of skin - Primary    Small tear (from friction) in a skin fold- posterior left abdomen/hip area  Possibly due to wearing a pr of tight jeans one day  No signs of infection  Recommend  Soap and water cleanse Antibiotic oint over the counter and if needed a non stick (tefla)  gauze tucked into the area (used this today) Leave open to air in clean conditions Avoid trauma to the area  Update if not starting to improve in a week or if worsening  Rev signs and symptoms of infection to watch for  Call back and Er precautions noted in detail today  12  Minutes were spent today both face to face and in the chart obtaining history, placing small dressing , reviewing wound care and discussing callback precautions           Candidal intertrigo    This seems to be improved with weight loss         Other   Morbid obesity (HCC)    Pt has lost substantial weight on mounjaro for dm2  Frustrated after gaining some weight back eating cake/dessert at a family event and some pretzels Identifies food cravings and tastes hard to control (sugar, carbs) Needs to restrict access if she feels hard to control situation and do not buy products   Interested in  nutritionist if covered by insurance and will let us know   Low glycemic diet and strength training exercise is recommended as tolerated   22  Minutes were spent today both face to face and in the chart obtaining history, reviewing records ,  performing exam , educating and discussing treatment options, (mounjaro and dietary changes)  and discussing future referral if necessary (nutrition)

## 2022-09-06 DIAGNOSIS — M4802 Spinal stenosis, cervical region: Secondary | ICD-10-CM | POA: Diagnosis not present

## 2022-09-06 DIAGNOSIS — G5603 Carpal tunnel syndrome, bilateral upper limbs: Secondary | ICD-10-CM | POA: Diagnosis not present

## 2022-09-06 DIAGNOSIS — M47812 Spondylosis without myelopathy or radiculopathy, cervical region: Secondary | ICD-10-CM | POA: Diagnosis not present

## 2022-09-06 DIAGNOSIS — M180 Bilateral primary osteoarthritis of first carpometacarpal joints: Secondary | ICD-10-CM | POA: Diagnosis not present

## 2022-09-10 ENCOUNTER — Encounter: Payer: Self-pay | Admitting: Orthopedic Surgery

## 2022-09-10 ENCOUNTER — Other Ambulatory Visit: Payer: Self-pay | Admitting: Orthopedic Surgery

## 2022-09-10 DIAGNOSIS — M4802 Spinal stenosis, cervical region: Secondary | ICD-10-CM

## 2022-09-11 DIAGNOSIS — E119 Type 2 diabetes mellitus without complications: Secondary | ICD-10-CM | POA: Diagnosis not present

## 2022-09-13 ENCOUNTER — Ambulatory Visit
Admission: RE | Admit: 2022-09-13 | Discharge: 2022-09-13 | Disposition: A | Discharge status: Home / Self Care | Payer: Medicare PPO | Source: Ambulatory Visit | Attending: Orthopedic Surgery | Admitting: Orthopedic Surgery

## 2022-09-13 DIAGNOSIS — M4802 Spinal stenosis, cervical region: Secondary | ICD-10-CM

## 2022-09-13 DIAGNOSIS — M542 Cervicalgia: Secondary | ICD-10-CM | POA: Diagnosis not present

## 2022-09-13 DIAGNOSIS — R202 Paresthesia of skin: Secondary | ICD-10-CM | POA: Diagnosis not present

## 2022-09-26 DIAGNOSIS — G5603 Carpal tunnel syndrome, bilateral upper limbs: Secondary | ICD-10-CM | POA: Diagnosis not present

## 2022-10-01 ENCOUNTER — Ambulatory Visit: Payer: Medicare PPO | Attending: Orthopedic Surgery | Admitting: Occupational Therapy

## 2022-10-01 ENCOUNTER — Encounter: Payer: Self-pay | Admitting: Occupational Therapy

## 2022-10-01 DIAGNOSIS — M79642 Pain in left hand: Secondary | ICD-10-CM | POA: Insufficient documentation

## 2022-10-01 DIAGNOSIS — M79641 Pain in right hand: Secondary | ICD-10-CM | POA: Insufficient documentation

## 2022-10-01 DIAGNOSIS — G5603 Carpal tunnel syndrome, bilateral upper limbs: Secondary | ICD-10-CM | POA: Insufficient documentation

## 2022-10-01 NOTE — Therapy (Deleted)
OUTPATIENT OCCUPATIONAL THERAPY BREAST CANCER BASELINE EVALUATION   Patient Name: Mackenzie Bonilla MRN: [id number] DOB:1965/04/24, 57 y.o., female Today's Date: 10/01/2022  END OF SESSION:  OT End of Session - 10/01/22 1522     Visit Number 1    Number of Visits 6    Date for OT Re-Evaluation 10/22/22    OT Start Time 1125    OT Stop Time 1213    OT Time Calculation (min) 48 min    Activity Tolerance Patient tolerated treatment well    Behavior During Therapy [facility] for tasks assessed/performed             Past Medical History:  Diagnosis Date   Allergy    allergic rhinitis   Anemia    Anxiety    Arthritis    osteoarthritis   Asthma    Claustrophobia    does not like oxygen mask covering face   Depression    Diabetes mellitus without complication (HCC)    Fall 2016   Fatty liver 07/2008   abd. ultrasound - fatty liver ; slt dilated cbd (no stones ) 06/10// abd.  ultrasound normal on 04/2006   Fibromyalgia    GERD (gastroesophageal reflux disease)    EGD negative 06/2001// EGD erythematous mucosa, polyp 08/2008   High uric acid in 24 hour urine specimen    Hyperhidrosis    Hyperlipidemia    Kidney stones    Lymphedema    Obesity    PCOS (polycystic ovarian syndrome)    Shortness of breath dyspnea    Tachycardia    TMJ (dislocation of temporomandibular joint)    UTI (lower urinary tract infection)    Past Surgical History:  Procedure Laterality Date   BREAST BIOPSY Right 2016   neg   BREAST LUMPECTOMY WITH RADIOACTIVE SEED LOCALIZATION Right 08/02/2014   Procedure: BREAST LUMPECTOMY WITH RADIOACTIVE SEED LOCALIZATION;  Surgeon: Emelia Loron, MD;  Location: [facility] OR;  Service: General;  Laterality: Right;   COLONOSCOPY  08/12/2012   Completed by Dr. Cecelia Byars, normal exam.    ENDOMETRIAL BIOPSY  01/2004   ESOPHAGOGASTRODUODENOSCOPY     ESOPHAGOGASTRODUODENOSCOPY (EGD) WITH PROPOFOL N/A 08/18/2019   Procedure: ESOPHAGOGASTRODUODENOSCOPY (EGD) WITH PROPOFOL;   Surgeon: Midge Minium, MD;  Location: [facility] ENDOSCOPY;  Service: Endoscopy;  Laterality: N/A;   FOOT SURGERY     SEPTOPLASTY     two times   TONSILLECTOMY     UTERINE FIBROID SURGERY  06/2005   ablation   uterine tumor  09/2001   VAGUS NERVE STIMULATOR INSERTION     Patient Active Problem List   Diagnosis Date Noted   Nontraumatic tear of skin 09/05/2022   Adverse effect of proton pump inhibitor 07/18/2022   Colon cancer screening 07/18/2022   Nasal congestion 04/30/2022   Breast asymmetry 02/21/2022   Muscle twitch 02/21/2022   Abdominal pannus 02/21/2022   Recurrent UTI 12/21/2021   Paresthesia 11/15/2021   Ingrown nail of great toe of left foot 11/15/2021   Keratosis 08/31/2021   Hair loss 06/19/2021   Breast pain 12/20/2020   Encounter for screening mammogram for breast cancer 12/13/2020   Lumbar facet arthropathy 12/29/2019   Chronic bilateral thoracic back pain 12/08/2019   Lumbar degenerative disc disease 12/08/2019   Fibromyalgia 12/08/2019   Chronic pain syndrome 12/08/2019   Lumbar radicular pain (left S1) 12/08/2019   Mid back pain 02/11/2019   Multiple joint pain 11/12/2018   Gallstones 04/19/2017   Diabetic polyneuropathy associated with type 2 diabetes mellitus (  HCC) 04/07/2017   Diabetic angiopathy (HCC) 04/07/2017   Vasomotor symptoms due to menopause 04/07/2017   Mobility impaired 11/12/2016   Urinary incontinence 12/02/2015   Candidal intertrigo 04/06/2015   Cystocele 04/06/2015   Constipation 04/06/2015   Myofascial pain syndrome of lumbar spine 12/28/2014   Hypertriglyceridemia 10/14/2014   Abdominal pain 04/23/2012   Uric acid kidney stone 11/20/2011   HYPERHIDROSIS 11/07/2009   Menopausal symptoms 06/17/2009   Type 2 diabetes mellitus without complication, without long-term current use of insulin (HCC) 08/23/2008   Vitamin D deficiency 05/07/2008   Vitamin B 12 deficiency 06/04/2007   CHRONIC FATIGUE SYNDROME 11/15/2006   DIVERTICULITIS, HX OF  11/15/2006   PCOS (polycystic ovarian syndrome) 10/16/2006   Hyperlipidemia associated with type 2 diabetes mellitus (HCC) 10/16/2006   Morbid obesity (HCC) 10/16/2006   Generalized anxiety disorder 10/16/2006   PANIC DISORDER 10/16/2006   BULIMIA 10/16/2006   Chronic depression 10/16/2006   CARPAL TUNNEL SYNDROME 10/16/2006   LYMPHEDEMA 10/16/2006   Allergic rhinitis 10/16/2006   Mild intermittent asthma 10/16/2006   TMJ SYNDROME 10/16/2006   GERD 10/16/2006   Irritable bowel syndrome 10/16/2006   Osteoarthritis 10/16/2006   COUGH, CHRONIC 10/16/2006    PCP: ***  REFERRING PROVIDER: ***  REFERRING DIAG: ***  THERAPY DIAG:  Bilateral hand pain  Bilateral carpal tunnel syndrome  Rationale for Evaluation and Treatment: {HABREHAB:27488}  ONSET DATE: ***  SUBJECTIVE:                                                                                                                                                                                           SUBJECTIVE STATEMENT: Patient reports she is here today after being refer by one of her medical team for her newly diagnosed {RIGHT/LEFT:21944} breast cancer.   PERTINENT HISTORY:  Patient was diagnosed with {RIGHT/LEFT:21944}  breast cancer - plan is to have modify mastectomy/lumpectomy.   PATIENT GOALS:   reduce lymphedema risk and learn post op HEP.   PAIN:  Are you having pain? {OPRCPAIN:27236}  PRECAUTIONS: Active CA {Therapy precautions:24002}  RED FLAGS: {PT Red Flags:29287}   HAND DOMINANCE: {RIGHT/LEFT:21944}  WEIGHT BEARING RESTRICTIONS: {Yes ***/No:24003}  FALLS:  Has patient fallen in last 6 months? {fallsyesno:27318}  LIVING ENVIRONMENT: Patient lives with: ***  OCCUPATION: ***  LEISURE: ***   OBJECTIVE:  COGNITION: Overall cognitive status: {cognition:24006}    POSTURE:  Forward head and rounded shoulders posture  UPPER EXTREMITY AROM/PROM:  A/PROM RIGHT   eval   Shoulder extension    Shoulder flexion   Shoulder abduction   Shoulder internal rotation   Shoulder external rotation     (Blank  rows = not tested)  A/PROM LEFT   eval  Shoulder extension   Shoulder flexion   Shoulder abduction   Shoulder internal rotation   Shoulder external rotation     (Blank rows = not tested)  CERVICAL AROM: All within normal limits:    Percent limited  Flexion   Extension   Right lateral flexion   Left lateral flexion   Right rotation   Left rotation     UPPER EXTREMITY STRENGTH: ***  LYMPHEDEMA ASSESSMENTS:   LANDMARK RIGHT   eval  10 cm proximal to olecranon process   Olecranon process   10 cm proximal to ulnar styloid process   Just proximal to ulnar styloid process   Across hand at thumb web space   At base of 2nd digit   (Blank rows = not tested)  LANDMARK LEFT   eval  10 cm proximal to olecranon process   Olecranon process   10 cm proximal to ulnar styloid process   Just proximal to ulnar styloid process   Across hand at thumb web space   At base of 2nd digit   (Blank rows = not tested)  L-DEX LYMPHEDEMA SCREENING:  The patient was assessed using the L-Dex machine today to produce a lymphedema index baseline score. The patient will be reassessed on a regular basis (typically every 3 months) to obtain new L-Dex scores. If the score is > 6.5 points away from his/her baseline score indicating onset of subclinical lymphedema, it will be recommended to wear a compression garment for 4 weeks, 12 hours per day and then be reassessed. If the score continues to be > 6.5 points from baseline at reassessment, we will initiate lymphedema treatment. Assessing in this manner has a 95% rate of preventing clinically significant lymphedema.    QUICK DASH SURVEY: ***  PATIENT EDUCATION:  Education details: Lymphedema risk reduction and post op shoulder/posture HEP Person educated: Patient Education method: Explanation, Demonstration, Handout Education  comprehension: Patient verbalized understanding and returned demonstration  HOME EXERCISE PROGRAM: Patient was instructed today in a home exercise program today for post op shoulder range of motion. These included active assist shoulder flexion in sitting/supine, scapular retraction, wall walking/slides with shoulder abduction, and hands behind head external rotation in sitting /supine.  She was encouraged to do these twice a day, holding 3 seconds and repeating 5 times when permitted by her physician/surgeon.   ASSESSMENT:  CLINICAL IMPRESSION: ***Her multidisciplinary medical team has met to assess and determine a recommended treatment plan. She is planning to have ***. She will benefit from a post op OT reassessment to determine needs and from L-Dex screens every 3 months for 2 years to detect subclinical lymphedema.  Pt will benefit from skilled therapeutic intervention to improve on the following deficits: Decreased knowledge of precautions and lymphedema education, impaired UE functional use, pain, decreased ROM, postural dysfunction.   OT treatment/interventions: ADL/self-care home management, pt/family education, therapeutic exercise,manual therapy  REHAB POTENTIAL: {rehabpotential:25112}  CLINICAL DECISION MAKING: {clinical decision making:25114}  EVALUATION COMPLEXITY: {Evaluation complexity:25115}   GOALS: Goals reviewed with patient? YES  LONG TERM GOALS: (STG=LTG)    Name Target Date Goal status  1 Pt will be able to verbalize understanding of pertinent lymphedema risk reduction practices relevant to her dx specifically related to skin care.  Baseline:  No knowledge *** Achieved at eval  2 Pt will be able to return demo and/or verbalize understanding of the post op HEP related to regaining shoulder ROM. Baseline:  No  knowledge *** Achieved at eval  3 Pt will be able to verbalize understanding of the importance of watching video about post op After Breast CA  for further  lymphedema risk reduction education and therapeutic exercise.  Baseline:  No knowledge *** Initial  4 Pt will demo she has regained full shoulder ROM and function post operatively compared to baselines.  Baseline: See objective measurements taken today. *** Initial    PLAN:  OT FREQUENCY/DURATION: EVAL and 1 follow up appointment.   PLAN FOR NEXT SESSION: will reassess 3-4 weeks post op to determine needs.   Patient will follow up at outpatient cancer rehab 3-4 weeks following surgery.  If the patient requires occupational therapy at that time, a specific plan will be dictated and sent to the referring physician for approval. The patient was educated today on appropriate basic range of motion exercises to begin post operatively and the importance of viewing the After Breast Cancer  video  following surgery.  Patient was educated today on lymphedema risk reduction practices as it pertains to recommendations that will benefit the patient immediately following surgery.  She/he verbalized good understanding.    Occupational Therapy Information for After Breast Cancer Surgery/Treatment:  Lymphedema is a swelling condition that you may be at risk for in your arm if you have lymph nodes removed from the armpit area.  After a sentinel node biopsy, the risk is approximately 5-9% and is higher after an axillary node dissection.  There is treatment available for this condition and it is not life-threatening.  Contact your physician or occupational therapist with concerns. You may begin the 4 shoulder/posture exercises (see additional sheet) when permitted by your physician (typically a week after surgery).  If you have drains, you may need to wait until those are removed before beginning range of motion exercises.  A general recommendation is to not lift your arms above shoulder height until drains are removed.  These exercises should be done to your tolerance and gently.  This is not a "no pain/no gain" type  of recovery so listen to your body and stretch into the range of motion that you can tolerate, stopping if you have pain.  If you are having immediate reconstruction, ask your plastic surgeon about doing exercises as he or she may want you to wait. We encourage you to watch the ABC (After Breast Cancer)  video. You will learn information related to lymphedema risk, prevention and treatment and additional exercises to regain mobility following surgery.  While undergoing any medical procedure or treatment, try to avoid blood pressure being taken or needle sticks from occurring on the arm on the side of cancer.   This recommendation begins after surgery and continues for the rest of your life.  This may help reduce your risk of getting lymphedema (swelling in your arm). An excellent resource for those seeking information on lymphedema is the [employer]'s web site. It can be accessed at www.lymphnet.org If you notice swelling in your hand, arm or breast at any time following surgery (even if it is many years from now), please contact your doctor or occupational therapist to discuss this.  Lymphedema can be treated at any time but it is easier for you if it is treated early on.  If you feel like your shoulder motion is not returning to normal in a reasonable amount of time, please contact your surgeon or occupational therapist.  [facility] [phone]. [address]  [place], [place] [address]   Goals for watching ABC after breast Cancer video    Understand specific stretches to improve the flexibility of you chest and shoulder. Learn ways to safely strengthen your upper body and improve your posture. Understand the warning signs of infection and why you may be at risk for an arm infection. Learn about Lymphedema and prevention.  Note: You do not follow direction provided on video until after surgery.  Drains must be removed to participate  Patient was  instructed today in a home exercise program today for post op shoulder range of motion. These included active assist shoulder flexion in sitting/supine, scapular retraction, wall slides/walking with shoulder abduction, and hands behind head external rotation.  She was encouraged to do these twice a day, holding 3 seconds and repeating 5 times when permitted by her physician.    Oletta Cohn, OT 10/01/2022, 4:14 PM

## 2022-10-01 NOTE — Therapy (Signed)
OUTPATIENT OCCUPATIONAL THERAPY ORTHO EVALUATION  Patient Name: Mackenzie Bonilla MRN: [id number] DOB:09-May-1965, 57 y.o., female Today's Date: 10/01/2022  PCP: DR Milinda Antis REFERRING PROVIDER: Dr Rosita Kea  END OF SESSION:  OT End of Session - 10/01/22 1522     Visit Number 1    Number of Visits 6    Date for OT Re-Evaluation 10/22/22    OT Start Time 1125    OT Stop Time 1213    OT Time Calculation (min) 48 min    Activity Tolerance Patient tolerated treatment well    Behavior During Therapy [facility] for tasks assessed/performed             Past Medical History:  Diagnosis Date   Allergy    allergic rhinitis   Anemia    Anxiety    Arthritis    osteoarthritis   Asthma    Claustrophobia    does not like oxygen mask covering face   Depression    Diabetes mellitus without complication (HCC)    Fall 2016   Fatty liver 07/2008   abd. ultrasound - fatty liver ; slt dilated cbd (no stones ) 06/10// abd.  ultrasound normal on 04/2006   Fibromyalgia    GERD (gastroesophageal reflux disease)    EGD negative 06/2001// EGD erythematous mucosa, polyp 08/2008   High uric acid in 24 hour urine specimen    Hyperhidrosis    Hyperlipidemia    Kidney stones    Lymphedema    Obesity    PCOS (polycystic ovarian syndrome)    Shortness of breath dyspnea    Tachycardia    TMJ (dislocation of temporomandibular joint)    UTI (lower urinary tract infection)    Past Surgical History:  Procedure Laterality Date   BREAST BIOPSY Right 2016   neg   BREAST LUMPECTOMY WITH RADIOACTIVE SEED LOCALIZATION Right 08/02/2014   Procedure: BREAST LUMPECTOMY WITH RADIOACTIVE SEED LOCALIZATION;  Surgeon: Emelia Loron, MD;  Location: [facility] OR;  Service: General;  Laterality: Right;   COLONOSCOPY  08/12/2012   Completed by Dr. Cecelia Byars, normal exam.    ENDOMETRIAL BIOPSY  01/2004   ESOPHAGOGASTRODUODENOSCOPY     ESOPHAGOGASTRODUODENOSCOPY (EGD) WITH PROPOFOL N/A 08/18/2019   Procedure:  ESOPHAGOGASTRODUODENOSCOPY (EGD) WITH PROPOFOL;  Surgeon: Midge Minium, MD;  Location: [facility] ENDOSCOPY;  Service: Endoscopy;  Laterality: N/A;   FOOT SURGERY     SEPTOPLASTY     two times   TONSILLECTOMY     UTERINE FIBROID SURGERY  06/2005   ablation   uterine tumor  09/2001   VAGUS NERVE STIMULATOR INSERTION     Patient Active Problem List   Diagnosis Date Noted   Nontraumatic tear of skin 09/05/2022   Adverse effect of proton pump inhibitor 07/18/2022   Colon cancer screening 07/18/2022   Nasal congestion 04/30/2022   Breast asymmetry 02/21/2022   Muscle twitch 02/21/2022   Abdominal pannus 02/21/2022   Recurrent UTI 12/21/2021   Paresthesia 11/15/2021   Ingrown nail of great toe of left foot 11/15/2021   Keratosis 08/31/2021   Hair loss 06/19/2021   Breast pain 12/20/2020   Encounter for screening mammogram for breast cancer 12/13/2020   Lumbar facet arthropathy 12/29/2019   Chronic bilateral thoracic back pain 12/08/2019   Lumbar degenerative disc disease 12/08/2019   Fibromyalgia 12/08/2019   Chronic pain syndrome 12/08/2019   Lumbar radicular pain (left S1) 12/08/2019   Mid back pain 02/11/2019   Multiple joint pain 11/12/2018   Gallstones 04/19/2017   Diabetic polyneuropathy associated  with type 2 diabetes mellitus (HCC) 04/07/2017   Diabetic angiopathy (HCC) 04/07/2017   Vasomotor symptoms due to menopause 04/07/2017   Mobility impaired 11/12/2016   Urinary incontinence 12/02/2015   Candidal intertrigo 04/06/2015   Cystocele 04/06/2015   Constipation 04/06/2015   Myofascial pain syndrome of lumbar spine 12/28/2014   Hypertriglyceridemia 10/14/2014   Abdominal pain 04/23/2012   Uric acid kidney stone 11/20/2011   HYPERHIDROSIS 11/07/2009   Menopausal symptoms 06/17/2009   Type 2 diabetes mellitus without complication, without long-term current use of insulin (HCC) 08/23/2008   Vitamin D deficiency 05/07/2008   Vitamin B 12 deficiency 06/04/2007   CHRONIC  FATIGUE SYNDROME 11/15/2006   DIVERTICULITIS, HX OF 11/15/2006   PCOS (polycystic ovarian syndrome) 10/16/2006   Hyperlipidemia associated with type 2 diabetes mellitus (HCC) 10/16/2006   Morbid obesity (HCC) 10/16/2006   Generalized anxiety disorder 10/16/2006   PANIC DISORDER 10/16/2006   BULIMIA 10/16/2006   Chronic depression 10/16/2006   CARPAL TUNNEL SYNDROME 10/16/2006   LYMPHEDEMA 10/16/2006   Allergic rhinitis 10/16/2006   Mild intermittent asthma 10/16/2006   TMJ SYNDROME 10/16/2006   GERD 10/16/2006   Irritable bowel syndrome 10/16/2006   Osteoarthritis 10/16/2006   COUGH, CHRONIC 10/16/2006    ONSET DATE: 6 months  REFERRING DIAG: Bil CTS  THERAPY DIAG:  Bilateral hand pain  Bilateral carpal tunnel syndrome  Rationale for Evaluation and Treatment: Rehabilitation  SUBJECTIVE:   SUBJECTIVE STATEMENT: R hand worse than the L - pain in the wrist and in the thumb - numbness in all fingers and feel about 3 months ago on elbow - pain in elbow too - pain get worse as the day goes. Had more than 10 yrs ago CTR on both hands  Pt accompanied by: self  PERTINENT HISTORY: DR Rosita Kea last note: Mackenzie Bonilla is a 57 y.o. female here today for follow-up after a CT of the cervical spine. I thought she had possible severe stenosis because she was having severe symptoms down the arm. However, the nerve test showed mild carpal tunnel. EMG did not show any significant stenosis. It showed degenerative disc disease with some small osteophytes, but no definite findings of neural foraminal stenosis. She comes in to review this test.  She reports experiencing shoulder pain that radiates down her arm, accompanied by swelling around the wrist. She also mentions a scrape on her wrist, and her fingers continue to curl. The pain intensifies with increased activity during the day. When she wears the splint, she experiences pain from the pressure of it. Three months ago, she fell and has pain  when she bends her elbow.   She occasionally experiences static pain in her left hand, but the shooting pain has lessened. Bending her fingers or rubbing her hand triggers anxiety. She is currently unemployed and disabled. She has not received hand therapy for this condition. When she removes the braces, she experiences pain within the elbow. When she initially started performing the hand exercises that was provided, she would experience pain that would persist all day.   She also reports a throbbing pain in the middle of her cervical spine. Her pain management doctor suggested that her pain in the middle is due to arthritis. She denies receiving any carpal tunnel injections.    PRECAUTIONS: None     WEIGHT BEARING RESTRICTIONS: No  PAIN:  Are you having pain? 5/10 - good day and over volar wrist and radial wrist into thumb   FALLS: Has patient fallen in last  6 months? No  LIVING ENVIRONMENT: Lives with: lives with their family and lives alone   PLOF: Has depression- gets food deliver, watch tv and on tablet playing games  PATIENT GOALS: Get the pain better in my hands and wrist   NEXT MD VISIT: no follow up date  OBJECTIVE:   HAND DOMINANCE: Right  A  UPPER EXTREMITY ROM:     Active ROM Right eval Left eval  Shoulder flexion    Shoulder abduction    Shoulder adduction    Shoulder extension    Shoulder internal rotation    Shoulder external rotation    Elbow flexion    Elbow extension    Wrist flexion 90 pain    Wrist extension 50pain    Wrist ulnar deviation 30   Wrist radial deviation 20   Wrist pronation    Wrist supination    (Blank rows = not tested)  Active ROM Right eval Left eval  Thumb MCP (0-60)    Thumb IP (0-80)    Thumb Radial abd/add (0-55) 45    Thumb Palmar abd/add (0-45) 55    Thumb Opposition to Small Finger     Index MCP (0-90)     Index PIP (0-100)     Index DIP (0-70)      Long MCP (0-90)      Long PIP (0-100)      Long DIP  (0-70)      Ring MCP (0-90)      Ring PIP (0-100)      Ring DIP (0-70)      Little MCP (0-90)      Little PIP (0-100)      Little DIP (0-70)      (Blank rows = not tested)    HAND FUNCTION: Grip strength: Right: 30 pain lbs; Left: 32 pain lbs, Lateral pinch: Right: 14 lbs, Left: 17 lbs, and 3 point pinch: Right: 11 pain  lbs, Left: 11pain lbs   SENSATION: Report numbness in all digits at times  EDEMA:report increase edema in R hand during day  COGNITION: Overall cognitive status: Within functional limits for tasks assessed     TODAY'S TREATMENT:                                                                                                                              DATE: 10/01/22 Pt fitted with CMC neoprene to use on R thumb and wrist - to be more compliant for wearing and support thumb and wrist with functional use  Fabricate elbow pad for cushioning over posterior elbow during day and night time volar elbow to decrease elbow flexion more than 90  Discussed with patient positioning and modifications to task to decrease numbness and pain in bilateral hands mostly on the right. Patient use a tablet with a stylus-patient to avoid tight prolonged grip-as well as in large grip or handles. Provided patient with a built-up handle for her stylus-to prop  up her tablet as well as cell phone. To decrease also cervical flexion. Patient to do contrast to 3 times a day on bilateral hands and carpal tunnel, use soft CMC neoprene and elbow pad as well as modifications   PATIENT EDUCATION: Education details: Findings of evaluation and modifications and splint wearing Person educated: Patient Education method: Explanation, Demonstration, Tactile cues, Verbal cues, and Handouts Education comprehension: verbalized understanding, returned demonstration, verbal cues required, tactile cues required, and needs further education    LONG TERM GOALS: Target date 3 wks  Patient to be  independent in home program to decrease pain at rest less than a 2/10 during the day. Baseline: Pain 5/10 at rest at wrist and radial wrist into thumb.  Increases during the day with use. Goal status: INITIAL  2.  Patient reports 3 modifications adaptive equipment to decrease numbness and decrease pain in right wrist and thumb. Baseline: No knowledge of adaptive equipment and modifications numbness and pain increases as the day goes and at nighttime -resting pain 5/10 Goal status: INITIAL  3.  Grip increased more than 5 pounds with less pain for patient to drop objects with less Baseline: Pain at volar wrist with grip strength 30 pounds on the right left 32 pounds Goal status: INITIAL  ASSESSMENT:  CLINICAL IMPRESSION: Patient i present at occupational therapy evaluation with a diagnosis of bilateral carpal tunnel.  The patient had 10 years ago bilateral carpal tunnel release.  Patient reports right hand worse than left.  Tenderness and pain mostly over carpal tunnel as well as right radial wrist into thumb.  Tenderness and pain over posterior elbow.  Negative Tinel at cubital tunnel.  Patient report pain in session 5/10 but increased as the day goes.  Report also numbness in all 5 fingers at times.  Patient shows decreased wrist extension as well as grip strength and thumb radial abduction.  Patient was fitted with a CMC neoprene on the right to be more compliant with splint wearing as well as soft elbow pad to use for cushioning over the posterior elbow and nighttime and volar elbow to decrease flexion of elbow.  Patient was educated on modifications and joint protection to decrease carpal tunnel symptoms and pain in wrist and hand.  Patient can benefit from skilled OT services to decrease edema and pain increase motion and strength in bilateral hands. PERFORMANCE DEFICITS: in functional skills including ADLs, IADLs, edema, ROM, strength, pain, flexibility, and UE functional use, cognitive skills  including energy/drive and thought, and psychosocial skills including coping strategies, environmental adaptation, habits, and routines and behaviors.   IMPAIRMENTS: are limiting patient from ADLs, IADLs, rest and sleep, play, leisure, and social participation.   COMORBIDITIES: may have co-morbidities  that affects occupational performance. Patient will benefit from skilled OT to address above impairments and improve overall function.  MODIFICATION OR ASSISTANCE TO COMPLETE EVALUATION: Min-Moderate modification of tasks or assist with assess necessary to complete an evaluation.  OT OCCUPATIONAL PROFILE AND HISTORY: Detailed assessment: Review of records and additional review of physical, cognitive, psychosocial history related to current functional performance.  CLINICAL DECISION MAKING: Moderate - several treatment options, min-mod task modification necessary  REHAB POTENTIAL: [facility]- depression - CTR in past   EVALUATION COMPLEXITY: Moderate      PLAN:  OT FREQUENCY: 2x/week  OT DURATION: 3 weeks  PLANNED INTERVENTIONS: therapeutic exercise, manual therapy, splinting, ultrasound, paraffin, fluidotherapy, contrast bath, patient/family education, energy conservation, and DME and/or AE instructions    CONSULTED AND AGREED WITH PLAN  OF CARE: Patient      Oletta Cohn, OTR/L,CLT 10/01/2022, 6:34 PM

## 2022-10-04 ENCOUNTER — Other Ambulatory Visit: Payer: Self-pay | Admitting: Family Medicine

## 2022-10-04 DIAGNOSIS — R809 Proteinuria, unspecified: Secondary | ICD-10-CM | POA: Diagnosis not present

## 2022-10-04 DIAGNOSIS — E1129 Type 2 diabetes mellitus with other diabetic kidney complication: Secondary | ICD-10-CM | POA: Diagnosis not present

## 2022-10-04 DIAGNOSIS — Z794 Long term (current) use of insulin: Secondary | ICD-10-CM | POA: Diagnosis not present

## 2022-10-05 NOTE — Telephone Encounter (Signed)
That may come from her endocrinologist Please ask her

## 2022-10-05 NOTE — Telephone Encounter (Signed)
Med is on med list as a historical entry and PCP hasn't filled before, please advise, Pt has had multiple recent acute appts

## 2022-10-05 NOTE — Telephone Encounter (Signed)
Left VM requesting pt to call the office back 

## 2022-10-08 ENCOUNTER — Ambulatory Visit: Payer: Medicare PPO | Admitting: Occupational Therapy

## 2022-10-08 DIAGNOSIS — M79642 Pain in left hand: Secondary | ICD-10-CM | POA: Diagnosis not present

## 2022-10-08 DIAGNOSIS — M79641 Pain in right hand: Secondary | ICD-10-CM | POA: Diagnosis not present

## 2022-10-08 DIAGNOSIS — G5603 Carpal tunnel syndrome, bilateral upper limbs: Secondary | ICD-10-CM

## 2022-10-08 NOTE — Telephone Encounter (Signed)
Pt said this was sent to [place] in error. Rx declined and pt aware

## 2022-10-11 ENCOUNTER — Ambulatory Visit: Payer: Medicare PPO | Admitting: Occupational Therapy

## 2022-10-11 ENCOUNTER — Encounter: Payer: Self-pay | Admitting: Occupational Therapy

## 2022-10-11 DIAGNOSIS — M79642 Pain in left hand: Secondary | ICD-10-CM | POA: Diagnosis not present

## 2022-10-11 DIAGNOSIS — M79641 Pain in right hand: Secondary | ICD-10-CM

## 2022-10-11 DIAGNOSIS — G5603 Carpal tunnel syndrome, bilateral upper limbs: Secondary | ICD-10-CM | POA: Diagnosis not present

## 2022-10-11 NOTE — Therapy (Signed)
OUTPATIENT OCCUPATIONAL THERAPY ORTHO TREATMENT  Patient Name: Mackenzie Bonilla MRN: [id number] DOB:[birth year], 57 y.o., female Today's Date: 10/11/2022  PCP: Mackenzie Bonilla REFERRING PROVIDER: Dr Rosita Bonilla  END OF SESSION:  OT End of Session - 10/11/22 2211     Visit Number 2    Number of Visits 6    Date for OT Re-Evaluation 10/22/22    OT Start Time 1344    OT Stop Time 1430    OT Time Calculation (min) 46 min    Activity Tolerance Patient tolerated treatment well    Behavior During Therapy [facility] for tasks assessed/performed             Past Medical History:  Diagnosis Date   Allergy    allergic rhinitis   Anemia    Anxiety    Arthritis    osteoarthritis   Asthma    Claustrophobia    does not like oxygen mask covering face   Depression    Diabetes mellitus without complication (HCC)    Fall 2016   Fatty liver 07/2008   abd. ultrasound - fatty liver ; slt dilated cbd (no stones ) 06/10// abd.  ultrasound normal on 04/2006   Fibromyalgia    GERD (gastroesophageal reflux disease)    EGD negative 06/2001// EGD erythematous mucosa, polyp 08/2008   High uric acid in 24 hour urine specimen    Hyperhidrosis    Hyperlipidemia    Kidney stones    Lymphedema    Obesity    PCOS (polycystic ovarian syndrome)    Shortness of breath dyspnea    Tachycardia    TMJ (dislocation of temporomandibular joint)    UTI (lower urinary tract infection)    Past Surgical History:  Procedure Laterality Date   BREAST BIOPSY Right 2016   neg   BREAST LUMPECTOMY WITH RADIOACTIVE SEED LOCALIZATION Right 08/02/2014   Procedure: BREAST LUMPECTOMY WITH RADIOACTIVE SEED LOCALIZATION;  Surgeon: Mackenzie Loron, MD;  Location[facility] OR;  Service: General;  Laterality: Right;   COLONOSCOPY  08/12/2012   Completed by Mackenzie. Cecelia Bonilla, normal exam.    ENDOMETRIAL BIOPSY  01/2004   ESOPHAGOGASTRODUODENOSCOPY     ESOPHAGOGASTRODUODENOSCOPY (EGD) WITH PROPOFOL N/A 08/18/2019   Procedure:  ESOPHAGOGASTRODUODENOSCOPY (EGD) WITH PROPOFOL;  Surgeon: Mackenzie Minium, MD;  Location: [facility] ENDOSCOPY;  Service: Endoscopy;  Laterality: N/A;   FOOT SURGERY     SEPTOPLASTY     two times   TONSILLECTOMY     UTERINE FIBROID SURGERY  06/2005   ablation   uterine tumor  09/2001   VAGUS NERVE STIMULATOR INSERTION     Patient Active Problem List   Diagnosis Date Noted   Nontraumatic tear of skin 09/05/2022   Adverse effect of proton pump inhibitor 07/18/2022   Colon cancer screening 07/18/2022   Nasal congestion 04/30/2022   Breast asymmetry 02/21/2022   Muscle twitch 02/21/2022   Abdominal pannus 02/21/2022   Recurrent UTI 12/21/2021   Paresthesia 11/15/2021   Ingrown nail of great toe of left foot 11/15/2021   Keratosis 08/31/2021   Hair loss 06/19/2021   Breast pain 12/20/2020   Encounter for screening mammogram for breast cancer 12/13/2020   Lumbar facet arthropathy 12/29/2019   Chronic bilateral thoracic back pain 12/08/2019   Lumbar degenerative disc disease 12/08/2019   Fibromyalgia 12/08/2019   Chronic pain syndrome 12/08/2019   Lumbar radicular pain (left S1) 12/08/2019   Mid back pain 02/11/2019   Multiple joint pain 11/12/2018   Gallstones 04/19/2017   Diabetic polyneuropathy associated  with type 2 diabetes mellitus (HCC) 04/07/2017   Diabetic angiopathy (HCC) 04/07/2017   Vasomotor symptoms due to menopause 04/07/2017   Mobility impaired 11/12/2016   Urinary incontinence 12/02/2015   Candidal intertrigo 04/06/2015   Cystocele 04/06/2015   Constipation 04/06/2015   Myofascial pain syndrome of lumbar spine 12/28/2014   Hypertriglyceridemia 10/14/2014   Abdominal pain 04/23/2012   Uric acid kidney stone 11/20/2011   HYPERHIDROSIS 11/07/2009   Menopausal symptoms 06/17/2009   Type 2 diabetes mellitus without complication, without long-term current use of insulin (HCC) 08/23/2008   Vitamin D deficiency 05/07/2008   Vitamin B 12 deficiency 06/04/2007   CHRONIC  FATIGUE SYNDROME 11/15/2006   DIVERTICULITIS, HX OF 11/15/2006   PCOS (polycystic ovarian syndrome) 10/16/2006   Hyperlipidemia associated with type 2 diabetes mellitus (HCC) 10/16/2006   Morbid obesity (HCC) 10/16/2006   Generalized anxiety disorder 10/16/2006   PANIC DISORDER 10/16/2006   BULIMIA 10/16/2006   Chronic depression 10/16/2006   CARPAL TUNNEL SYNDROME 10/16/2006   LYMPHEDEMA 10/16/2006   Allergic rhinitis 10/16/2006   Mild intermittent asthma 10/16/2006   TMJ SYNDROME 10/16/2006   GERD 10/16/2006   Irritable bowel syndrome 10/16/2006   Osteoarthritis 10/16/2006   COUGH, CHRONIC 10/16/2006    ONSET DATE: 6 months  REFERRING DIAG: Bil CTS  THERAPY DIAG:  Bilateral hand pain  Bilateral carpal tunnel syndrome  Rationale for Evaluation and Treatment: Rehabilitation  SUBJECTIVE:   SUBJECTIVE STATEMENT: Pt reports she has not been able yet to get a holder for her phone or tablet.  She doesn't have [employer] but her sister does so she may ask her to order a few things for her.    Pt accompanied by: self  PERTINENT HISTORY: Mackenzie Bonilla last note: Mackenzie Bonilla is a 57 y.o. female here today for follow-up after a CT of the cervical spine. I thought she had possible severe stenosis because she was having severe symptoms down the arm. However, the nerve test showed mild carpal tunnel. EMG did not show any significant stenosis. It showed degenerative disc disease with some small osteophytes, but no definite findings of neural foraminal stenosis. She comes in to review this test.  She reports experiencing shoulder pain that radiates down her arm, accompanied by swelling around the wrist. She also mentions a scrape on her wrist, and her fingers continue to curl. The pain intensifies with increased activity during the day. When she wears the splint, she experiences pain from the pressure of it. Three months ago, she fell and has pain when she bends her elbow.   She  occasionally experiences static pain in her left hand, but the shooting pain has lessened. Bending her fingers or rubbing her hand triggers anxiety. She is currently unemployed and disabled. She has not received hand therapy for this condition. When she removes the braces, she experiences pain within the elbow. When she initially started performing the hand exercises that was provided, she would experience pain that would persist all day.   She also reports a throbbing pain in the middle of her cervical spine. Her pain management doctor suggested that her pain in the middle is due to arthritis. She denies receiving any carpal tunnel injections.    PRECAUTIONS: None  WEIGHT BEARING RESTRICTIONS: No  PAIN:  Are you having pain? 5/10 - good day and over volar wrist and radial wrist into thumb   FALLS: Has patient fallen in last 6 months? No  LIVING ENVIRONMENT: Lives with: lives with their family and  lives alone   PLOF: Has depression- gets food deliver, watch tv and on tablet playing games  PATIENT GOALS: Get the pain better in my hands and wrist   NEXT MD VISIT: no follow up date  OBJECTIVE:   HAND DOMINANCE: Right  A  UPPER EXTREMITY ROM:     Active ROM Right eval Left eval  Shoulder flexion    Shoulder abduction    Shoulder adduction    Shoulder extension    Shoulder internal rotation    Shoulder external rotation    Elbow flexion    Elbow extension    Wrist flexion 90 pain    Wrist extension 50pain    Wrist ulnar deviation 30   Wrist radial deviation 20   Wrist pronation    Wrist supination    (Blank rows = not tested)  Active ROM Right eval Left eval  Thumb MCP (0-60)    Thumb IP (0-80)    Thumb Radial abd/add (0-55) 45    Thumb Palmar abd/add (0-45) 55    Thumb Opposition to Small Finger     Index MCP (0-90)     Index PIP (0-100)     Index DIP (0-70)      Long MCP (0-90)      Long PIP (0-100)      Long DIP (0-70)      Ring MCP (0-90)      Ring PIP  (0-100)      Ring DIP (0-70)      Little MCP (0-90)      Little PIP (0-100)      Little DIP (0-70)      (Blank rows = not tested)    HAND FUNCTION: Grip strength: Right: 30 pain lbs; Left: 32 pain lbs, Lateral pinch: Right: 14 lbs, Left: 17 lbs, and 3 point pinch: Right: 11 pain  lbs, Left: 11pain lbs   SENSATION: Report numbness in all digits at times  EDEMA:report increase edema in R hand during day  COGNITION: Overall cognitive status: Within functional limits for tasks assessed     TODAY'S TREATMENT:                                                                                                                              DATE: 10/08/2022   Contrast:   Pt seen for use of contrast to decrease pain, decrease edema and inflammation to right hand and wrist for 11 mins total.    Therapeutic Exercises:  Tendon gliding exercises for right hand, thumb ROM for radial and palmar ABD.   Carpal and MC spreads    ADL:  Pt fitted previously with CMC neoprene to use on R thumb and wrist - to be more compliant for wearing and support thumb and wrist with functional use and has been using at home.   She has cushioned elbow pad for padding over posterior elbow during day and night time volar elbow to decrease elbow  flexion more than 90.  She said she does not think she bends her elbow at night and feels pad shifts and was coming apart.  Therapist demonstrated how to readjust padding when this happens.   Continued to reinforce positioning and modifications to task to decrease numbness and pain in bilateral hands mostly on the right. Patient use a tablet with a stylus-patient to avoid tight prolonged grip-as well as in large grip or handles.  built-up handle for her stylus was too large for her stylus from last session.  Therapist cut another piece of red foam and trimmed around the inside to make it smaller, pt can place tape around it to hold the stylus in.  Pt provided with print out  for tablet holders/pillows to be able to prop tablet for use and to also decrease also cervical flexion.  Patient to do contrast to 3 times a day on bilateral hands and carpal tunnel, use soft CMC neoprene and elbow pad as well as modifications   PATIENT EDUCATION: Education details: Findings of evaluation and modifications and splint wearing Person educated: Patient Education method: Explanation, Demonstration, Tactile cues, Verbal cues, and Handouts Education comprehension: verbalized understanding, returned demonstration, verbal cues required, tactile cues required, and needs further education    LONG TERM GOALS: Target date 3 wks  Patient to be independent in home program to decrease pain at rest less than a 2/10 during the day. Baseline: Pain 5/10 at rest at wrist and radial wrist into thumb.  Increases during the day with use. Goal status: INITIAL  2.  Patient reports 3 modifications adaptive equipment to decrease numbness and decrease pain in right wrist and thumb. Baseline: No knowledge of adaptive equipment and modifications numbness and pain increases as the day goes and at nighttime -resting pain 5/10 Goal status: INITIAL  3.  Grip increased more than 5 pounds with less pain for patient to drop objects with less Baseline: Pain at volar wrist with grip strength 30 pounds on the right left 32 pounds Goal status: INITIAL  ASSESSMENT:  CLINICAL IMPRESSION: Patient  present sat occupational therapy evaluation with a diagnosis of bilateral carpal tunnel.  The patient had 10 years ago bilateral carpal tunnel release.  Patient reports right hand worse than left.  Tenderness and pain mostly over carpal tunnel as well as right radial wrist into thumb.  Tenderness and pain over posterior elbow.  Negative Tinel at cubital tunnel.  Patient report pain in session 4/10 but increased as the day goes.  Report also numbness in all 5 fingers at times.  Patient shows decreased wrist extension as  well as grip strength and thumb radial abduction.  Patient was fitted with a CMC neoprene on the right to be more compliant with splint wearing as well as soft elbow pad to use for cushioning over the posterior elbow and nighttime and volar elbow to decrease flexion of elbow.  Patient was educated on modifications and joint protection to decrease carpal tunnel symptoms and pain in wrist and hand.  Pt denies any change since last session, she still needs to order a few items to assist with positioning and use of electronic devices such as phone and tablet which may be aggravating her symptoms.  She required additional instruction of elbow padding and how to use and adjust as needed.  Provided patient with another built up grip to try with stylus with smaller circumference.  Continue to monitor symptoms and make adjustments as needed.  Patient can benefit from skilled  OT services to decrease edema and pain increase motion and strength in bilateral hands. PERFORMANCE DEFICITS: in functional skills including ADLs, IADLs, edema, ROM, strength, pain, flexibility, and UE functional use, cognitive skills including energy/drive and thought, and psychosocial skills including coping strategies, environmental adaptation, habits, and routines and behaviors.   IMPAIRMENTS: are limiting patient from ADLs, IADLs, rest and sleep, play, leisure, and social participation.   COMORBIDITIES: may have co-morbidities  that affects occupational performance. Patient will benefit from skilled OT to address above impairments and improve overall function.  MODIFICATION OR ASSISTANCE TO COMPLETE EVALUATION: Min-Moderate modification of tasks or assist with assess necessary to complete an evaluation.  OT OCCUPATIONAL PROFILE AND HISTORY: Detailed assessment: Review of records and additional review of physical, cognitive, psychosocial history related to current functional performance.  CLINICAL DECISION MAKING: Moderate - several  treatment options, min-mod task modification necessary  REHAB POTENTIAL: [facility]- depression - [facility] in past   EVALUATION COMPLEXITY: Moderate  PLAN:  OT FREQUENCY: 2x/week  OT DURATION: 3 weeks  PLANNED INTERVENTIONS: therapeutic exercise, manual therapy, splinting, ultrasound, paraffin, fluidotherapy, contrast bath, patient/family education, energy conservation, and DME and/or AE instructions  CONSULTED AND AGREED WITH PLAN OF CARE: Patient Maeryn Mcgath Cornelius Moras, OTR/L, CLT  Kassadee Carawan, OTR/L,CLT 10/11/2022, 10:12 PM

## 2022-10-12 ENCOUNTER — Encounter: Payer: Self-pay | Admitting: Podiatry

## 2022-10-12 ENCOUNTER — Ambulatory Visit: Payer: Medicare PPO | Admitting: Podiatry

## 2022-10-12 ENCOUNTER — Encounter: Payer: Self-pay | Admitting: Occupational Therapy

## 2022-10-12 DIAGNOSIS — M79675 Pain in left toe(s): Secondary | ICD-10-CM | POA: Diagnosis not present

## 2022-10-12 DIAGNOSIS — M79674 Pain in right toe(s): Secondary | ICD-10-CM | POA: Diagnosis not present

## 2022-10-12 DIAGNOSIS — B351 Tinea unguium: Secondary | ICD-10-CM | POA: Diagnosis not present

## 2022-10-12 DIAGNOSIS — E119 Type 2 diabetes mellitus without complications: Secondary | ICD-10-CM | POA: Diagnosis not present

## 2022-10-12 NOTE — Progress Notes (Signed)
Chief Complaint  Patient presents with   Diabetes    "I'm here for a Diabetic foot check.  At night when I'm in bed, it feels like something my right foot is vibrating under my little toe area."    SUBJECTIVE Patient presents to office today complaining of elongated, thickened nails that cause pain while ambulating in shoes.  Patient is unable to trim their own nails. Patient is here for further evaluation and treatment.  Past Medical History:  Diagnosis Date   Allergy    allergic rhinitis   Anemia    Anxiety    Arthritis    osteoarthritis   Asthma    Claustrophobia    does not like oxygen mask covering face   Depression    Diabetes mellitus without complication (HCC)    Fall 2016   Fatty liver 07/2008   abd. ultrasound - fatty liver ; slt dilated cbd (no stones ) 06/10// abd.  ultrasound normal on 04/2006   Fibromyalgia    GERD (gastroesophageal reflux disease)    EGD negative 06/2001// EGD erythematous mucosa, polyp 08/2008   High uric acid in 24 hour urine specimen    Hyperhidrosis    Hyperlipidemia    Kidney stones    Lymphedema    Obesity    PCOS (polycystic ovarian syndrome)    Shortness of breath dyspnea    Tachycardia    TMJ (dislocation of temporomandibular joint)    UTI (lower urinary tract infection)     Allergies  Allergen Reactions   Cephalexin    Codeine     rash   Duloxetine    Levaquin [Levofloxacin] Nausea Only   Lisinopril Cough   Metformin And Related Diarrhea    GI side effects    Quetiapine Other (See Comments)   Sertraline Hcl     REACTION: vivid dreams   Triazolam     REACTION: ? reaction   Zaleplon    Zocor [Simvastatin - High Dose]     achey   Zolpidem Tartrate     hallucinations   Zyrtec [Cetirizine]     Sedation    Hydrocodone Itching and Rash    REACTION: redness rash itching   Norelgestromin-Eth Estradiol     Patch didn't stick to skin     OBJECTIVE General Patient is awake, alert, and oriented x 3 and in no  acute distress. Derm Skin is dry and supple bilateral. Negative open lesions or macerations. Remaining integument unremarkable. Nails are tender, long, thickened and dystrophic with subungual debris, consistent with onychomycosis, 1-5 bilateral. No signs of infection noted. Vasc  DP and PT pedal pulses palpable bilaterally. Temperature gradient within normal limits.  Neuro Epicritic and protective threshold sensation grossly intact bilaterally.  Musculoskeletal Exam No symptomatic pedal deformities noted bilateral. Muscular strength within normal limits.  ASSESSMENT 1.  Pain due to onychomycosis of toenails both  PLAN OF CARE 1. Patient evaluated today.  2. Instructed to maintain good pedal hygiene and foot care.  3. Mechanical debridement of nails 1-5 bilaterally performed using a nail nipper. Filed with dremel without incident.  4. Return to clinic in 3 mos.    Felecia Shelling, DPM [facility]  Dr. Felecia Shelling, DPM    [address]  [place], [place] [address]                Office [phone]  Fax [phone]

## 2022-10-12 NOTE — Therapy (Signed)
OUTPATIENT OCCUPATIONAL THERAPY ORTHO TREATMENT  Patient Name: Mackenzie Bonilla MRN: [id number] DOB:Nov 28, 1965, 57 y.o., female   PCP: DR Alethia Berthold PROVIDER: Dr Rosita Kea  END OF SESSION:  OT End of Session - 10/12/22 1858     Visit Number 3    Number of Visits 6    Date for OT Re-Evaluation 10/22/22    OT Start Time 1201    OT Stop Time 1245    OT Time Calculation (min) 44 min    Activity Tolerance Patient tolerated treatment well    Behavior During Therapy [facility] for tasks assessed/performed             Past Medical History:  Diagnosis Date   Allergy    allergic rhinitis   Anemia    Anxiety    Arthritis    osteoarthritis   Asthma    Claustrophobia    does not like oxygen mask covering face   Depression    Diabetes mellitus without complication (HCC)    Fall 2016   Fatty liver 07/2008   abd. ultrasound - fatty liver ; slt dilated cbd (no stones ) 06/10// abd.  ultrasound normal on 04/2006   Fibromyalgia    GERD (gastroesophageal reflux disease)    EGD negative 06/2001// EGD erythematous mucosa, polyp 08/2008   High uric acid in 24 hour urine specimen    Hyperhidrosis    Hyperlipidemia    Kidney stones    Lymphedema    Obesity    PCOS (polycystic ovarian syndrome)    Shortness of breath dyspnea    Tachycardia    TMJ (dislocation of temporomandibular joint)    UTI (lower urinary tract infection)    Past Surgical History:  Procedure Laterality Date   BREAST BIOPSY Right 2016   neg   BREAST LUMPECTOMY WITH RADIOACTIVE SEED LOCALIZATION Right 08/02/2014   Procedure: BREAST LUMPECTOMY WITH RADIOACTIVE SEED LOCALIZATION;  Surgeon: Emelia Loron, MD;  Location: [facility] OR;  Service: General;  Laterality: Right;   COLONOSCOPY  08/12/2012   Completed by Dr. Cecelia Byars, normal exam.    ENDOMETRIAL BIOPSY  01/2004   ESOPHAGOGASTRODUODENOSCOPY     ESOPHAGOGASTRODUODENOSCOPY (EGD) WITH PROPOFOL N/A 08/18/2019   Procedure: ESOPHAGOGASTRODUODENOSCOPY (EGD) WITH  PROPOFOL;  Surgeon: Midge Minium, MD;  Location: [facility] ENDOSCOPY;  Service: Endoscopy;  Laterality: N/A;   FOOT SURGERY     SEPTOPLASTY     two times   TONSILLECTOMY     UTERINE FIBROID SURGERY  06/2005   ablation   uterine tumor  09/2001   VAGUS NERVE STIMULATOR INSERTION     Patient Active Problem List   Diagnosis Date Noted   Nontraumatic tear of skin 09/05/2022   Adverse effect of proton pump inhibitor 07/18/2022   Colon cancer screening 07/18/2022   Nasal congestion 04/30/2022   Breast asymmetry 02/21/2022   Muscle twitch 02/21/2022   Abdominal pannus 02/21/2022   Recurrent UTI 12/21/2021   Paresthesia 11/15/2021   Ingrown nail of great toe of left foot 11/15/2021   Keratosis 08/31/2021   Hair loss 06/19/2021   Breast pain 12/20/2020   Encounter for screening mammogram for breast cancer 12/13/2020   Lumbar facet arthropathy 12/29/2019   Chronic bilateral thoracic back pain 12/08/2019   Lumbar degenerative disc disease 12/08/2019   Fibromyalgia 12/08/2019   Chronic pain syndrome 12/08/2019   Lumbar radicular pain (left S1) 12/08/2019   Mid back pain 02/11/2019   Multiple joint pain 11/12/2018   Gallstones 04/19/2017   Diabetic polyneuropathy associated with type  2 diabetes mellitus (HCC) 04/07/2017   Diabetic angiopathy (HCC) 04/07/2017   Vasomotor symptoms due to menopause 04/07/2017   Mobility impaired 11/12/2016   Urinary incontinence 12/02/2015   Candidal intertrigo 04/06/2015   Cystocele 04/06/2015   Constipation 04/06/2015   Myofascial pain syndrome of lumbar spine 12/28/2014   Hypertriglyceridemia 10/14/2014   Abdominal pain 04/23/2012   Uric acid kidney stone 11/20/2011   HYPERHIDROSIS 11/07/2009   Menopausal symptoms 06/17/2009   Type 2 diabetes mellitus without complication, without long-term current use of insulin (HCC) 08/23/2008   Vitamin D deficiency 05/07/2008   Vitamin B 12 deficiency 06/04/2007   CHRONIC FATIGUE SYNDROME 11/15/2006    DIVERTICULITIS, HX OF 11/15/2006   PCOS (polycystic ovarian syndrome) 10/16/2006   Hyperlipidemia associated with type 2 diabetes mellitus (HCC) 10/16/2006   Morbid obesity (HCC) 10/16/2006   Generalized anxiety disorder 10/16/2006   PANIC DISORDER 10/16/2006   BULIMIA 10/16/2006   Chronic depression 10/16/2006   CARPAL TUNNEL SYNDROME 10/16/2006   LYMPHEDEMA 10/16/2006   Allergic rhinitis 10/16/2006   Mild intermittent asthma 10/16/2006   TMJ SYNDROME 10/16/2006   GERD 10/16/2006   Irritable bowel syndrome 10/16/2006   Osteoarthritis 10/16/2006   COUGH, CHRONIC 10/16/2006    ONSET DATE: 6 months  REFERRING DIAG: Bil CTS  THERAPY DIAG:  Bilateral hand pain  Bilateral carpal tunnel syndrome  Rationale for Evaluation and Treatment: Rehabilitation  SUBJECTIVE:   SUBJECTIVE STATEMENT: Pt reports she is not feeling well today, her fibromyalgia is worse today and she is having neck pain.  "I almost cancelled for today because I feel so bad."   Pt accompanied by: self  PERTINENT HISTORY: DR Rosita Kea last note: PRIM AMOAH is a 57 y.o. female here today for follow-up after a CT of the cervical spine. I thought she had possible severe stenosis because she was having severe symptoms down the arm. However, the nerve test showed mild carpal tunnel. EMG did not show any significant stenosis. It showed degenerative disc disease with some small osteophytes, but no definite findings of neural foraminal stenosis. She comes in to review this test.  She reports experiencing shoulder pain that radiates down her arm, accompanied by swelling around the wrist. She also mentions a scrape on her wrist, and her fingers continue to curl. The pain intensifies with increased activity during the day. When she wears the splint, she experiences pain from the pressure of it. Three months ago, she fell and has pain when she bends her elbow.   She occasionally experiences static pain in her left hand, but  the shooting pain has lessened. Bending her fingers or rubbing her hand triggers anxiety. She is currently unemployed and disabled. She has not received hand therapy for this condition. When she removes the braces, she experiences pain within the elbow. When she initially started performing the hand exercises that was provided, she would experience pain that would persist all day.   She also reports a throbbing pain in the middle of her cervical spine. Her pain management doctor suggested that her pain in the middle is due to arthritis. She denies receiving any carpal tunnel injections.    PRECAUTIONS: None  WEIGHT BEARING RESTRICTIONS: No  PAIN:  Are you having pain? 5/10 - good day and over volar wrist and radial wrist into thumb   FALLS: Has patient fallen in last 6 months? No  LIVING ENVIRONMENT: Lives with: lives with their family and lives alone   PLOF: Has depression- gets food deliver, watch tv  and on tablet playing games  PATIENT GOALS: Get the pain better in my hands and wrist   NEXT MD VISIT: no follow up date  OBJECTIVE:   HAND DOMINANCE: Right  A  UPPER EXTREMITY ROM:     Active ROM Right eval Left eval  Shoulder flexion    Shoulder abduction    Shoulder adduction    Shoulder extension    Shoulder internal rotation    Shoulder external rotation    Elbow flexion    Elbow extension    Wrist flexion 90 pain    Wrist extension 50pain    Wrist ulnar deviation 30   Wrist radial deviation 20   Wrist pronation    Wrist supination    (Blank rows = not tested)  Active ROM Right eval Left eval  Thumb MCP (0-60)    Thumb IP (0-80)    Thumb Radial abd/add (0-55) 45    Thumb Palmar abd/add (0-45) 55    Thumb Opposition to Small Finger     Index MCP (0-90)     Index PIP (0-100)     Index DIP (0-70)      Long MCP (0-90)      Long PIP (0-100)      Long DIP (0-70)      Ring MCP (0-90)      Ring PIP (0-100)      Ring DIP (0-70)      Little MCP (0-90)       Little PIP (0-100)      Little DIP (0-70)      (Blank rows = not tested)    HAND FUNCTION: Grip strength: Right: 30 pain lbs; Left: 32 pain lbs, Lateral pinch: Right: 14 lbs, Left: 17 lbs, and 3 point pinch: Right: 11 pain  lbs, Left: 11pain lbs   SENSATION: Report numbness in all digits at times  EDEMA:report increase edema in R hand during day  COGNITION: Overall cognitive status: Within functional limits for tasks assessed     TODAY'S TREATMENT:                                                                                                                              DATE: 10/11/2022   Contrast:   Pt seen for use of contrast to decrease pain, decrease edema and inflammation to right hand and wrist for 11 mins total.   Moist heat to neck and shoulders while performing contrast per patient request due to neck pain and tightness.   Manual Therapy:   Pt seen for soft tissue mobilizations to right hand to promote decreased pain, decreased edema.  Carpal and metacarpal spreads, manual stretch to web space  Therapeutic Exercises:  Tendon gliding exercises for right hand, thumb ROM for radial and palmar ABD.    ADL:  Pt fitted previously with CMC neoprene to use on R thumb and wrist - to be more compliant for wearing and support thumb and wrist  with functional use and has been using at home.   She has cushioned elbow pad for padding over posterior elbow during day and night time volar elbow to decrease elbow flexion more than 90.  She said she does not think she bends her elbow at night and feels pad shifts and was coming apart.  Therapist demonstrated how to readjust padding when this happens.   Continued to reinforce positioning and modifications to task to decrease numbness and pain in bilateral hands mostly on the right. Patient use a tablet with a stylus-patient to avoid tight prolonged grip-as well as in large grip or handles.  built-up handle for her stylus was too large  for her stylus from last session.  Therapist cut another piece of red foam and trimmed around the inside to make it smaller, pt can place tape around it to hold the stylus in.  Pt provided with print out for tablet holders/pillows to be able to prop tablet for use and to also decrease also cervical flexion.  Pt reports she did not get her foam on her stylus yet, needs someone to assist her with taping it in place.  She did get a pop it socket for her phone and asked therapist to look at it and explain about positioning of her hand and wrist when using it.  She has not decided if she will use it or not.   Patient to do contrast to 3 times a day on bilateral hands and carpal tunnel, use soft CMC neoprene and elbow pad as well as modifications   PATIENT EDUCATION: Education details: Findings of evaluation and modifications and splint wearing Person educated: Patient Education method: Explanation, Demonstration, Tactile cues, Verbal cues, and Handouts Education comprehension: verbalized understanding, returned demonstration, verbal cues required, tactile cues required, and needs further education    LONG TERM GOALS: Target date 3 wks  Patient to be independent in home program to decrease pain at rest less than a 2/10 during the day. Baseline: Pain 5/10 at rest at wrist and radial wrist into thumb.  Increases during the day with use. Goal status: INITIAL  2.  Patient reports 3 modifications adaptive equipment to decrease numbness and decrease pain in right wrist and thumb. Baseline: No knowledge of adaptive equipment and modifications numbness and pain increases as the day goes and at nighttime -resting pain 5/10 Goal status: INITIAL  3.  Grip increased more than 5 pounds with less pain for patient to drop objects with less Baseline: Pain at volar wrist with grip strength 30 pounds on the right left 32 pounds Goal status: INITIAL  ASSESSMENT:  CLINICAL IMPRESSION: Patient  present sat  occupational therapy evaluation with a diagnosis of bilateral carpal tunnel.  The patient had 10 years ago bilateral carpal tunnel release.  Patient reports right hand worse than left.  Tenderness and pain mostly over carpal tunnel as well as right radial wrist into thumb.  Tenderness and pain over posterior elbow.  Negative Tinel at cubital tunnel.  Patient report pain in session 4/10 but increased as the day goes.  Report also numbness in all 5 fingers at times.  Patient shows decreased wrist extension as well as grip strength and thumb radial abduction.  Patient was fitted with a CMC neoprene on the right to be more compliant with splint wearing as well as soft elbow pad to use for cushioning over the posterior elbow and nighttime and volar elbow to decrease flexion of elbow.  Patient educated on modifications and  joint protection to decrease carpal tunnel symptoms and pain in wrist and hand. She still needs to order a few items to assist with positioning and use of electronic devices such as phone and tablet which may be aggravating her symptoms.   Provided patient with another built up grip last session to try with stylus with smaller circumference but has not applied it to stylus yet.  She did purchase a pop socket for her phone and has not decided if she will keep it or not. Pt not feeling well today with her fibromyalgia and neck pain.  Continue to monitor symptoms and make adjustments as needed.  Patient can benefit from skilled OT services to decrease edema and pain increase motion and strength in bilateral hands. PERFORMANCE DEFICITS: in functional skills including ADLs, IADLs, edema, ROM, strength, pain, flexibility, and UE functional use, cognitive skills including energy/drive and thought, and psychosocial skills including coping strategies, environmental adaptation, habits, and routines and behaviors.   IMPAIRMENTS: are limiting patient from ADLs, IADLs, rest and sleep, play, leisure, and social  participation.   COMORBIDITIES: may have co-morbidities  that affects occupational performance. Patient will benefit from skilled OT to address above impairments and improve overall function.  MODIFICATION OR ASSISTANCE TO COMPLETE EVALUATION: Min-Moderate modification of tasks or assist with assess necessary to complete an evaluation.  OT OCCUPATIONAL PROFILE AND HISTORY: Detailed assessment: Review of records and additional review of physical, cognitive, psychosocial history related to current functional performance.  CLINICAL DECISION MAKING: Moderate - several treatment options, min-mod task modification necessary  REHAB POTENTIAL: [facility]- depression - [facility] in past   EVALUATION COMPLEXITY: Moderate  PLAN:  OT FREQUENCY: 2x/week  OT DURATION: 3 weeks  PLANNED INTERVENTIONS: therapeutic exercise, manual therapy, splinting, ultrasound, paraffin, fluidotherapy, contrast bath, patient/family education, energy conservation, and DME and/or AE instructions  CONSULTED AND AGREED WITH PLAN OF CARE: Patient   Dayvin Aber, OTR/L,CLT 10/12/2022, 6:59 PM

## 2022-10-16 ENCOUNTER — Ambulatory Visit: Payer: Medicare PPO | Attending: Orthopedic Surgery | Admitting: Occupational Therapy

## 2022-10-16 DIAGNOSIS — G5603 Carpal tunnel syndrome, bilateral upper limbs: Secondary | ICD-10-CM | POA: Insufficient documentation

## 2022-10-16 DIAGNOSIS — M79641 Pain in right hand: Secondary | ICD-10-CM | POA: Diagnosis not present

## 2022-10-16 DIAGNOSIS — M79642 Pain in left hand: Secondary | ICD-10-CM | POA: Diagnosis not present

## 2022-10-18 ENCOUNTER — Encounter: Payer: Self-pay | Admitting: Occupational Therapy

## 2022-10-18 ENCOUNTER — Ambulatory Visit: Payer: Medicare PPO | Admitting: Occupational Therapy

## 2022-10-18 DIAGNOSIS — M79641 Pain in right hand: Secondary | ICD-10-CM

## 2022-10-18 DIAGNOSIS — G5603 Carpal tunnel syndrome, bilateral upper limbs: Secondary | ICD-10-CM | POA: Diagnosis not present

## 2022-10-18 DIAGNOSIS — M79642 Pain in left hand: Secondary | ICD-10-CM | POA: Diagnosis not present

## 2022-10-18 NOTE — Therapy (Signed)
OUTPATIENT OCCUPATIONAL THERAPY ORTHO TREATMENT  Patient Name: Mackenzie Bonilla MRN: [id number] DOB:Mar 11, 1965, 57 y.o., female Today's Date: 10/18/2022  PCP: Mackenzie Bonilla REFERRING PROVIDER: Dr Rosita Bonilla  END OF SESSION:  OT End of Session - 10/18/22 1852     Visit Number 5    Number of Visits 6    Date for OT Re-Evaluation 10/22/22    OT Start Time 1449    OT Stop Time 1530    OT Time Calculation (min) 41 min    Activity Tolerance Patient tolerated treatment well    Behavior During Therapy [facility] for tasks assessed/performed             Past Medical History:  Diagnosis Date   Allergy    allergic rhinitis   Anemia    Anxiety    Arthritis    osteoarthritis   Asthma    Claustrophobia    does not like oxygen mask covering face   Depression    Diabetes mellitus without complication (HCC)    Fall 2016   Fatty liver 07/2008   abd. ultrasound - fatty liver ; slt dilated cbd (no stones ) 06/10// abd.  ultrasound normal on 04/2006   Fibromyalgia    GERD (gastroesophageal reflux disease)    EGD negative 06/2001// EGD erythematous mucosa, polyp 08/2008   High uric acid in 24 hour urine specimen    Hyperhidrosis    Hyperlipidemia    Kidney stones    Lymphedema    Obesity    PCOS (polycystic ovarian syndrome)    Shortness of breath dyspnea    Tachycardia    TMJ (dislocation of temporomandibular joint)    UTI (lower urinary tract infection)    Past Surgical History:  Procedure Laterality Date   BREAST BIOPSY Right 2016   neg   BREAST LUMPECTOMY WITH RADIOACTIVE SEED LOCALIZATION Right 08/02/2014   Procedure: BREAST LUMPECTOMY WITH RADIOACTIVE SEED LOCALIZATION;  Surgeon: Emelia Loron, MD;  Location: [facility] OR;  Service: General;  Laterality: Right;   COLONOSCOPY  08/12/2012   Completed by Mackenzie. Cecelia Byars, normal exam.    ENDOMETRIAL BIOPSY  01/2004   ESOPHAGOGASTRODUODENOSCOPY     ESOPHAGOGASTRODUODENOSCOPY (EGD) WITH PROPOFOL N/A 08/18/2019   Procedure:  ESOPHAGOGASTRODUODENOSCOPY (EGD) WITH PROPOFOL;  Surgeon: Midge Minium, MD;  Location: [facility] ENDOSCOPY;  Service: Endoscopy;  Laterality: N/A;   FOOT SURGERY     SEPTOPLASTY     two times   TONSILLECTOMY     UTERINE FIBROID SURGERY  06/2005   ablation   uterine tumor  09/2001   VAGUS NERVE STIMULATOR INSERTION     Patient Active Problem List   Diagnosis Date Noted   Nontraumatic tear of skin 09/05/2022   Adverse effect of proton pump inhibitor 07/18/2022   Colon cancer screening 07/18/2022   Nasal congestion 04/30/2022   Breast asymmetry 02/21/2022   Muscle twitch 02/21/2022   Abdominal pannus 02/21/2022   Recurrent UTI 12/21/2021   Paresthesia 11/15/2021   Ingrown nail of great toe of left foot 11/15/2021   Keratosis 08/31/2021   Hair loss 06/19/2021   Breast pain 12/20/2020   Encounter for screening mammogram for breast cancer 12/13/2020   Lumbar facet arthropathy 12/29/2019   Chronic bilateral thoracic back pain 12/08/2019   Lumbar degenerative disc disease 12/08/2019   Fibromyalgia 12/08/2019   Chronic pain syndrome 12/08/2019   Lumbar radicular pain (left S1) 12/08/2019   Mid back pain 02/11/2019   Multiple joint pain 11/12/2018   Gallstones 04/19/2017   Diabetic polyneuropathy associated  with type 2 diabetes mellitus (HCC) 04/07/2017   Diabetic angiopathy (HCC) 04/07/2017   Vasomotor symptoms due to menopause 04/07/2017   Mobility impaired 11/12/2016   Urinary incontinence 12/02/2015   Candidal intertrigo 04/06/2015   Cystocele 04/06/2015   Constipation 04/06/2015   Myofascial pain syndrome of lumbar spine 12/28/2014   Hypertriglyceridemia 10/14/2014   Abdominal pain 04/23/2012   Uric acid kidney stone 11/20/2011   HYPERHIDROSIS 11/07/2009   Menopausal symptoms 06/17/2009   Type 2 diabetes mellitus without complication, without long-term current use of insulin (HCC) 08/23/2008   Vitamin D deficiency 05/07/2008   Vitamin B 12 deficiency 06/04/2007   CHRONIC  FATIGUE SYNDROME 11/15/2006   DIVERTICULITIS, HX OF 11/15/2006   PCOS (polycystic ovarian syndrome) 10/16/2006   Hyperlipidemia associated with type 2 diabetes mellitus (HCC) 10/16/2006   Morbid obesity (HCC) 10/16/2006   Generalized anxiety disorder 10/16/2006   PANIC DISORDER 10/16/2006   BULIMIA 10/16/2006   Chronic depression 10/16/2006   CARPAL TUNNEL SYNDROME 10/16/2006   LYMPHEDEMA 10/16/2006   Allergic rhinitis 10/16/2006   Mild intermittent asthma 10/16/2006   TMJ SYNDROME 10/16/2006   GERD 10/16/2006   Irritable bowel syndrome 10/16/2006   Osteoarthritis 10/16/2006   COUGH, CHRONIC 10/16/2006    ONSET DATE: 6 months  REFERRING DIAG: Bil CTS  THERAPY DIAG:  Bilateral hand pain  Bilateral carpal tunnel syndrome  Rationale for Evaluation and Treatment: Rehabilitation  SUBJECTIVE:   SUBJECTIVE STATEMENT: My medication is expensive and running for the first time into my cap - so trying to figure that out and stressing about all the bills I need to pay- hand doing little better- not spasms as much anymore Pt accompanied by: self  PERTINENT HISTORY: Mackenzie Bonilla last note: Mackenzie Bonilla is a 57 y.o. female here today for follow-up after a CT of the cervical spine. I thought she had possible severe stenosis because she was having severe symptoms down the arm. However, the nerve test showed mild carpal tunnel. EMG did not show any significant stenosis. It showed degenerative disc disease with some small osteophytes, but no definite findings of neural foraminal stenosis. She comes in to review this test.  She reports experiencing shoulder pain that radiates down her arm, accompanied by swelling around the wrist. She also mentions a scrape on her wrist, and her fingers continue to curl. The pain intensifies with increased activity during the day. When she wears the splint, she experiences pain from the pressure of it. Three months ago, she fell and has pain when she bends her  elbow.   She occasionally experiences static pain in her left hand, but the shooting pain has lessened. Bending her fingers or rubbing her hand triggers anxiety. She is currently unemployed and disabled. She has not received hand therapy for this condition. When she removes the braces, she experiences pain within the elbow. When she initially started performing the hand exercises that was provided, she would experience pain that would persist all day.   She also reports a throbbing pain in the middle of her cervical spine. Her pain management doctor suggested that her pain in the middle is due to arthritis. She denies receiving any carpal tunnel injections.    PRECAUTIONS: None     WEIGHT BEARING RESTRICTIONS: No  PAIN:  Are you having pain? No pain reported today except with prehension strength assessment   FALLS: Has patient fallen in last 6 months? No  LIVING ENVIRONMENT: Lives with: lives with their family and lives alone  PLOF: Has depression- gets food deliver, watch tv and on tablet playing games  PATIENT GOALS: Get the pain better in my hands and wrist   NEXT MD VISIT: no follow up date  OBJECTIVE:   HAND DOMINANCE: Right  A  UPPER EXTREMITY ROM:     Active ROM Right eval Left eval  Shoulder flexion    Shoulder abduction    Shoulder adduction    Shoulder extension    Shoulder internal rotation    Shoulder external rotation    Elbow flexion    Elbow extension    Wrist flexion 90 pain    Wrist extension 50pain    Wrist ulnar deviation 30   Wrist radial deviation 20   Wrist pronation    Wrist supination    (Blank rows = not tested)  Active ROM Right eval R 10/18/22  Thumb MCP (0-60)    Thumb IP (0-80)    Thumb Radial abd/add (0-55) 45 50  Thumb Palmar abd/add (0-45) 55 55  Thumb Opposition to Small Finger    Index MCP (0-90)    Index PIP (0-100)    Index DIP (0-70)     Long MCP (0-90)     Long PIP (0-100)     Long DIP (0-70)     Ring MCP (0-90)      Ring PIP (0-100)     Ring DIP (0-70)     Little MCP (0-90)     Little PIP (0-100)     Little DIP (0-70)     (Blank rows = not tested)    HAND FUNCTION: Grip strength: Right: 30 pain lbs; Left: 32 pain lbs, Lateral pinch: Right: 14 lbs, Left: 17 lbs, and 3 point pinch: Right: 11 pain  lbs, Left: 11pain lbs 10/18/22 FOLLOW UP  Grip strength: Right: 35 pain lbs; Left: 40 pain lbs, Lateral pinch: Right: 17 lbs, Left: 17 lbs, and 3 point pinch: Right: 10 pain  lbs, Left: 11pain lbs   SENSATION: Report numbness in all digits at times  EDEMA:report increase edema in R hand during day but fluctuate  COGNITION: Overall cognitive status: Within functional limits for tasks assessed     TODAY'S TREATMENT:                                                                                                                              DATE: 10/01/22 Pt cont with CMC neoprene to use on R thumb and wrist - to be more compliant for wearing and support thumb and wrist with functional use  Review again with pt  positioning and modifications to task to decrease numbness and pain in bilateral hands mostly on the right. Assess use of several grips for her to use on tablet with a stylus-patient to avoid tight prolonged grip-or pinch Patient ordered something to use to  prop up her tablet as well as cell phone. To decrease also cervical flexion. Pt to use pump bottles  for soap and hair produce And then ring for handle for cell phone  Patient to cont with do contrast to 2-3 times a day on bilateral hands and carpal tunnel, use soft CMC neoprene  and cont  modifications   PATIENT EDUCATION: Education details: progress and modifications and splint wearing Person educated: Patient Education method: Explanation, Demonstration, Tactile cues, Verbal cues, and Handouts Education comprehension: verbalized understanding, returned demonstration, verbal cues required, tactile cues required, and needs further  education    LONG TERM GOALS: Target date 3 wks  Patient to be independent in home program to decrease pain at rest less than a 2/10 during the day. Baseline: Pain 5/10 at rest at wrist and radial wrist into thumb.  Increases during the day with use. Goal status: INITIAL  2.  Patient reports 3 modifications adaptive equipment to decrease numbness and decrease pain in right wrist and thumb. Baseline: No knowledge of adaptive equipment and modifications numbness and pain increases as the day goes and at nighttime -resting pain 5/10 Goal status: INITIAL  3.  Grip increased more than 5 pounds with less pain for patient to drop objects with less Baseline: Pain at volar wrist with grip strength 30 pounds on the right left 32 pounds Goal status: INITIAL  ASSESSMENT:  CLINICAL IMPRESSION: Patient present at occupational therapy evaluation with a diagnosis of bilateral carpal tunnel.  The patient had 10 years ago bilateral carpal tunnel release.  Patient reports right hand worse than left.  Tenderness and pain mostly over carpal tunnel as well as right radial wrist into thumb.  Tenderness and pain over posterior elbow.  Negative Tinel at cubital tunnel.  Patient report pain at eval 5/10 but increased as the day goes.  Report also numbness in all 5 fingers at times.  Patient made great progress since evaluation in grip and lateral pinch especially in the right.  Patient report decreased spasms or tightness in the right hand compared to evaluation. Pt to cont to benefit from use and wearing of  CMC neoprene on the right hand.  Patient implemented a lot of modifications as well as adaptive equipment -and reviewed this date again modifications and joint protection to decrease carpal tunnel symptoms and pain in wrist and hand.  Patient can benefit from skilled OT services to decrease edema and pain increase motion and strength in bilateral hands. PERFORMANCE DEFICITS: in functional skills including ADLs,  IADLs, edema, ROM, strength, pain, flexibility, and UE functional use, cognitive skills including energy/drive and thought, and psychosocial skills including coping strategies, environmental adaptation, habits, and routines and behaviors.   IMPAIRMENTS: are limiting patient from ADLs, IADLs, rest and sleep, play, leisure, and social participation.   COMORBIDITIES: may have co-morbidities  that affects occupational performance. Patient will benefit from skilled OT to address above impairments and improve overall function.  MODIFICATION OR ASSISTANCE TO COMPLETE EVALUATION: Min-Moderate modification of tasks or assist with assess necessary to complete an evaluation.  OT OCCUPATIONAL PROFILE AND HISTORY: Detailed assessment: Review of records and additional review of physical, cognitive, psychosocial history related to current functional performance.  CLINICAL DECISION MAKING: Moderate - several treatment options, min-mod task modification necessary  REHAB POTENTIAL: [facility]- depression - [facility] in past   EVALUATION COMPLEXITY: Moderate      PLAN:  OT FREQUENCY: 1 x wk   OT DURATION: 3 weeks  PLANNED INTERVENTIONS: therapeutic exercise, manual therapy, splinting, ultrasound, paraffin, fluidotherapy, contrast bath, patient/family education, energy conservation, and DME and/or AE instructions    CONSULTED AND  AGREED WITH PLAN OF CARE: Patient      Oletta Cohn, OTR/L,CLT 10/18/2022, 6:55 PM

## 2022-10-18 NOTE — Therapy (Signed)
OUTPATIENT OCCUPATIONAL THERAPY ORTHO TREATMENT  Patient Name: Mackenzie Bonilla MRN: [id number] DOB:[birth year], 57 y.o., female   PCP: DR Mackenzie Bonilla PROVIDER: Dr Rosita Kea  END OF SESSION:  OT End of Session - 10/18/22 1100     Visit Number 4    Number of Visits 6    Date for OT Re-Evaluation 10/22/22    OT Start Time 1400    OT Stop Time 1445    OT Time Calculation (min) 45 min    Activity Tolerance Patient tolerated treatment well    Behavior During Therapy [facility] for tasks assessed/performed             Past Medical History:  Diagnosis Date   Allergy    allergic rhinitis   Anemia    Anxiety    Arthritis    osteoarthritis   Asthma    Claustrophobia    does not like oxygen mask covering face   Depression    Diabetes mellitus without complication (HCC)    Fall 2016   Fatty liver 07/2008   abd. ultrasound - fatty liver ; slt dilated cbd (no stones ) 06/10// abd.  ultrasound normal on 04/2006   Fibromyalgia    GERD (gastroesophageal reflux disease)    EGD negative 06/2001// EGD erythematous mucosa, polyp 08/2008   High uric acid in 24 hour urine specimen    Hyperhidrosis    Hyperlipidemia    Kidney stones    Lymphedema    Obesity    PCOS (polycystic ovarian syndrome)    Shortness of breath dyspnea    Tachycardia    TMJ (dislocation of temporomandibular joint)    UTI (lower urinary tract infection)    Past Surgical History:  Procedure Laterality Date   BREAST BIOPSY Right 2016   neg   BREAST LUMPECTOMY WITH RADIOACTIVE SEED LOCALIZATION Right 08/02/2014   Procedure: BREAST LUMPECTOMY WITH RADIOACTIVE SEED LOCALIZATION;  Surgeon: Emelia Loron, MD;  Location: [facility] OR;  Service: General;  Laterality: Right;   COLONOSCOPY  08/12/2012   Completed by Dr. Cecelia Byars, normal exam.    ENDOMETRIAL BIOPSY  01/2004   ESOPHAGOGASTRODUODENOSCOPY     ESOPHAGOGASTRODUODENOSCOPY (EGD) WITH PROPOFOL N/A 08/18/2019   Procedure: ESOPHAGOGASTRODUODENOSCOPY (EGD) WITH  PROPOFOL;  Surgeon: Midge Minium, MD;  Location: [facility] ENDOSCOPY;  Service: Endoscopy;  Laterality: N/A;   FOOT SURGERY     SEPTOPLASTY     two times   TONSILLECTOMY     UTERINE FIBROID SURGERY  06/2005   ablation   uterine tumor  09/2001   VAGUS NERVE STIMULATOR INSERTION     Patient Active Problem List   Diagnosis Date Noted   Nontraumatic tear of skin 09/05/2022   Adverse effect of proton pump inhibitor 07/18/2022   Colon cancer screening 07/18/2022   Nasal congestion 04/30/2022   Breast asymmetry 02/21/2022   Muscle twitch 02/21/2022   Abdominal pannus 02/21/2022   Recurrent UTI 12/21/2021   Paresthesia 11/15/2021   Ingrown nail of great toe of left foot 11/15/2021   Keratosis 08/31/2021   Hair loss 06/19/2021   Breast pain 12/20/2020   Encounter for screening mammogram for breast cancer 12/13/2020   Lumbar facet arthropathy 12/29/2019   Chronic bilateral thoracic back pain 12/08/2019   Lumbar degenerative disc disease 12/08/2019   Fibromyalgia 12/08/2019   Chronic pain syndrome 12/08/2019   Lumbar radicular pain (left S1) 12/08/2019   Mid back pain 02/11/2019   Multiple joint pain 11/12/2018   Gallstones 04/19/2017   Diabetic polyneuropathy associated with type  2 diabetes mellitus (HCC) 04/07/2017   Diabetic angiopathy (HCC) 04/07/2017   Vasomotor symptoms due to menopause 04/07/2017   Mobility impaired 11/12/2016   Urinary incontinence 12/02/2015   Candidal intertrigo 04/06/2015   Cystocele 04/06/2015   Constipation 04/06/2015   Myofascial pain syndrome of lumbar spine 12/28/2014   Hypertriglyceridemia 10/14/2014   Abdominal pain 04/23/2012   Uric acid kidney stone 11/20/2011   HYPERHIDROSIS 11/07/2009   Menopausal symptoms 06/17/2009   Type 2 diabetes mellitus without complication, without long-term current use of insulin (HCC) 08/23/2008   Vitamin D deficiency 05/07/2008   Vitamin B 12 deficiency 06/04/2007   CHRONIC FATIGUE SYNDROME 11/15/2006    DIVERTICULITIS, HX OF 11/15/2006   PCOS (polycystic ovarian syndrome) 10/16/2006   Hyperlipidemia associated with type 2 diabetes mellitus (HCC) 10/16/2006   Morbid obesity (HCC) 10/16/2006   Generalized anxiety disorder 10/16/2006   PANIC DISORDER 10/16/2006   BULIMIA 10/16/2006   Chronic depression 10/16/2006   CARPAL TUNNEL SYNDROME 10/16/2006   LYMPHEDEMA 10/16/2006   Allergic rhinitis 10/16/2006   Mild intermittent asthma 10/16/2006   TMJ SYNDROME 10/16/2006   GERD 10/16/2006   Irritable bowel syndrome 10/16/2006   Osteoarthritis 10/16/2006   COUGH, CHRONIC 10/16/2006    ONSET DATE: 6 months  REFERRING DIAG: Bil CTS  THERAPY DIAG:  Bilateral hand pain  Bilateral carpal tunnel syndrome  Rationale for Evaluation and Treatment: Rehabilitation  SUBJECTIVE:   SUBJECTIVE STATEMENT: Pt reports she is feeling much better today after not feeling well last session due to her fibromyalgia.  She ordered a stand pillow for her tablet but it hasn't come in yet.    Pt accompanied by: self  PERTINENT HISTORY: DR Rosita Kea last note: Mackenzie Bonilla is a 57 y.o. female here today for follow-up after a CT of the cervical spine. I thought she had possible severe stenosis because she was having severe symptoms down the arm. However, the nerve test showed mild carpal tunnel. EMG did not show any significant stenosis. It showed degenerative disc disease with some small osteophytes, but no definite findings of neural foraminal stenosis. She comes in to review this test.  She reports experiencing shoulder pain that radiates down her arm, accompanied by swelling around the wrist. She also mentions a scrape on her wrist, and her fingers continue to curl. The pain intensifies with increased activity during the day. When she wears the splint, she experiences pain from the pressure of it. Three months ago, she fell and has pain when she bends her elbow.   She occasionally experiences static pain in  her left hand, but the shooting pain has lessened. Bending her fingers or rubbing her hand triggers anxiety. She is currently unemployed and disabled. She has not received hand therapy for this condition. When she removes the braces, she experiences pain within the elbow. When she initially started performing the hand exercises that was provided, she would experience pain that would persist all day.   She also reports a throbbing pain in the middle of her cervical spine. Her pain management doctor suggested that her pain in the middle is due to arthritis. She denies receiving any carpal tunnel injections.    PRECAUTIONS: None  WEIGHT BEARING RESTRICTIONS: No  PAIN:  Are you having pain? 5/10 - good day and over volar wrist and radial wrist into thumb   FALLS: Has patient fallen in last 6 months? No  LIVING ENVIRONMENT: Lives with: lives with their family and lives alone   PLOF: Has depression- gets  food deliver, watch tv and on tablet playing games  PATIENT GOALS: Get the pain better in my hands and wrist   NEXT MD VISIT: no follow up date  OBJECTIVE:   HAND DOMINANCE: Right  A  UPPER EXTREMITY ROM:     Active ROM Right eval Left eval  Shoulder flexion    Shoulder abduction    Shoulder adduction    Shoulder extension    Shoulder internal rotation    Shoulder external rotation    Elbow flexion    Elbow extension    Wrist flexion 90 pain    Wrist extension 50pain    Wrist ulnar deviation 30   Wrist radial deviation 20   Wrist pronation    Wrist supination    (Blank rows = not tested)  Active ROM Right eval Left eval  Thumb MCP (0-60)    Thumb IP (0-80)    Thumb Radial abd/add (0-55) 45    Thumb Palmar abd/add (0-45) 55    Thumb Opposition to Small Finger     Index MCP (0-90)     Index PIP (0-100)     Index DIP (0-70)      Long MCP (0-90)      Long PIP (0-100)      Long DIP (0-70)      Ring MCP (0-90)      Ring PIP (0-100)      Ring DIP (0-70)       Little MCP (0-90)      Little PIP (0-100)      Little DIP (0-70)      (Blank rows = not tested)    HAND FUNCTION: Grip strength: Right: 30 pain lbs; Left: 32 pain lbs, Lateral pinch: Right: 14 lbs, Left: 17 lbs, and 3 point pinch: Right: 11 pain  lbs, Left: 11pain lbs   SENSATION: Report numbness in all digits at times  EDEMA:report increase edema in R hand during day  COGNITION: Overall cognitive status: Within functional limits for tasks assessed   TODAY'S TREATMENT:                                                                                                                              DATE: 10/16/2022  ADL:  Pt reports she ordered a stand for her tablet to use at home and a couple other items.  She brought in the foam for her stylus and feels it doesn't feel right with the tape on it and wanted to know if there were any alternatives such as a smaller foam opening for tubing.  We researched options for stylus holders for a built up surface, most are for [employer] products and hers is a samsung pen.  Therapist will order smaller holed foam 1/4 inch opening to try with her stylus since it is smaller than a pen and doesn't fit into the red foam.    Pt fitted previously with CMC neoprene to use on R  thumb and wrist - to be more compliant for wearing and support thumb and wrist with functional use and has been using at home.  She feels the [facility] brace is becoming too loose and not fitting as well anymore.  Therapist has more on order but hasn't received the order yet.   She has cushioned elbow pad for padding over posterior elbow during day and night time volar elbow to decrease elbow flexion more than 90.   Continued to reinforce positioning and modifications to task to decrease numbness and pain in bilateral hands mostly on the right.    Fluido therapy:  Pt seen for use of fluido therapy to bilateral hand and wrists to decrease pain, increase tissue mobility and improve range of  motion, 10 mins.    Manual Therapy:   Pt seen for soft tissue mobilizations to right hand to promote decreased pain, decreased edema.  Carpal and metacarpal spreads, manual stretch to web space  Therapeutic Exercises:  Tendon gliding exercises for right hand, thumb ROM for radial and palmar ABD.    Patient to do contrast to 3 times a day on bilateral hands and carpal tunnel, use soft CMC neoprene and elbow pad as well as modifications She still has to decide on a option for holding her phone, not sure if she likes the pop socket and also brought in a picture of a wrap handle for the back of the phone but not sure it will be flexible enough for her hand to fit in.    PATIENT EDUCATION: Education details: Findings of evaluation and modifications and splint wearing Person educated: Patient Education method: Explanation, Demonstration, Tactile cues, Verbal cues, and Handouts Education comprehension: verbalized understanding, returned demonstration, verbal cues required, tactile cues required, and needs further education    LONG TERM GOALS: Target date 3 wks  Patient to be independent in home program to decrease pain at rest less than a 2/10 during the day. Baseline: Pain 5/10 at rest at wrist and radial wrist into thumb.  Increases during the day with use. Goal status: INITIAL  2.  Patient reports 3 modifications adaptive equipment to decrease numbness and decrease pain in right wrist and thumb. Baseline: No knowledge of adaptive equipment and modifications numbness and pain increases as the day goes and at nighttime -resting pain 5/10 Goal status: INITIAL  3.  Grip increased more than 5 pounds with less pain for patient to drop objects with less Baseline: Pain at volar wrist with grip strength 30 pounds on the right left 32 pounds Goal status: INITIAL  ASSESSMENT:  CLINICAL IMPRESSION: Patient  present sat occupational therapy evaluation with a diagnosis of bilateral carpal tunnel.   The patient had 10 years ago bilateral carpal tunnel release.  Patient reports right hand worse than left.  Tenderness and pain mostly over carpal tunnel as well as right radial wrist into thumb.  Tenderness and pain over posterior elbow.  Negative Tinel at cubital tunnel. Pain at times and numbness in all 5 fingers at times.  Patient shows decreased wrist extension as well as grip strength and thumb radial abduction.  Patient was fitted with a CMC neoprene on the right to be more compliant with splint wearing as well as soft elbow pad to use for cushioning over the posterior elbow and nighttime and volar elbow to decrease flexion of elbow.  Patient educated on modifications and joint protection to decrease carpal tunnel symptoms and pain in wrist and hand.  She has ordered a few  items such as a pop socket for her phone, a tablet holder and therapist ordered smaller holed foam for her stylus.  She is using the built up foam on other objects and feels it helps decrease her pain overall.  She responded well today to fluidotherapy to bilateral UEs. She feels her thumb CMC neoprene brace is stretched out too much now and not providing her the support it did before.  We have more on order but have not received yet.  Continue to monitor symptoms and make adjustments as needed.  Patient can benefit from skilled OT services to decrease edema and pain increase motion and strength in bilateral hands. PERFORMANCE DEFICITS: in functional skills including ADLs, IADLs, edema, ROM, strength, pain, flexibility, and UE functional use, cognitive skills including energy/drive and thought, and psychosocial skills including coping strategies, environmental adaptation, habits, and routines and behaviors.   IMPAIRMENTS: are limiting patient from ADLs, IADLs, rest and sleep, play, leisure, and social participation.   COMORBIDITIES: may have co-morbidities  that affects occupational performance. Patient will benefit from skilled OT to  address above impairments and improve overall function.  MODIFICATION OR ASSISTANCE TO COMPLETE EVALUATION: Min-Moderate modification of tasks or assist with assess necessary to complete an evaluation.  OT OCCUPATIONAL PROFILE AND HISTORY: Detailed assessment: Review of records and additional review of physical, cognitive, psychosocial history related to current functional performance.  CLINICAL DECISION MAKING: Moderate - several treatment options, min-mod task modification necessary  REHAB POTENTIAL: [facility]- depression - CTR in past   EVALUATION COMPLEXITY: Moderate  PLAN:  OT FREQUENCY: 2x/week  OT DURATION: 3 weeks  PLANNED INTERVENTIONS: therapeutic exercise, manual therapy, splinting, ultrasound, paraffin, fluidotherapy, contrast bath, patient/family education, energy conservation, and DME and/or AE instructions  CONSULTED AND AGREED WITH PLAN OF CARE: Patient   Zayyan Mullen, OTR/L,CLT 10/18/2022, 11:14 AM

## 2022-10-23 ENCOUNTER — Telehealth: Payer: Self-pay

## 2022-10-23 NOTE — Telephone Encounter (Signed)
Colonoscopy/EGD scheduled 11-01-22-discussed golytely prep-and the low fiber diet.

## 2022-10-23 NOTE — Telephone Encounter (Signed)
Patient in to discuss about her colonoscopy.

## 2022-10-25 DIAGNOSIS — H6123 Impacted cerumen, bilateral: Secondary | ICD-10-CM | POA: Diagnosis not present

## 2022-10-25 DIAGNOSIS — H902 Conductive hearing loss, unspecified: Secondary | ICD-10-CM | POA: Diagnosis not present

## 2022-10-26 DIAGNOSIS — E1142 Type 2 diabetes mellitus with diabetic polyneuropathy: Secondary | ICD-10-CM | POA: Diagnosis not present

## 2022-10-26 DIAGNOSIS — Z794 Long term (current) use of insulin: Secondary | ICD-10-CM | POA: Diagnosis not present

## 2022-10-26 DIAGNOSIS — E1129 Type 2 diabetes mellitus with other diabetic kidney complication: Secondary | ICD-10-CM | POA: Diagnosis not present

## 2022-10-26 DIAGNOSIS — R809 Proteinuria, unspecified: Secondary | ICD-10-CM | POA: Diagnosis not present

## 2022-10-29 ENCOUNTER — Telehealth: Payer: Self-pay

## 2022-10-29 ENCOUNTER — Telehealth: Payer: Self-pay | Admitting: Physician Assistant

## 2022-10-29 MED ORDER — PEG 3350-KCL-NA BICARB-NACL 420 G PO SOLR: 4000.0000 mL | Freq: Once | ORAL | 0 refills | Status: AC

## 2022-10-29 NOTE — Telephone Encounter (Signed)
Patient wanted to let us know that her insulin dose has changed and she was instructed to half the dose the night before the procedure.

## 2022-10-29 NOTE — Telephone Encounter (Signed)
Spoke with patient-she stated she is constipated most times- she has not been taking the Miralax daily-only sometimes- so I talk with Inetta Fermo and was instructed to have patient on 2 day prep of Golytely-start Golytely on Tuesday 9-17 at noon and drink 1/2 the jug. Drink 8 once glass of water every hour- then drink the other half starting at 5 pm that evening.   On 10-31-22 at 5 pm she will start drinking Golytely prep and then the morning of 5 hours prior to procedure she will drink the remaining prep within 2 hours and nothing to drink 3 hours prior to arriving at the hospital.  Patient wrote down instructions and read them back and verbalized understanding.

## 2022-10-29 NOTE — Telephone Encounter (Signed)
PT requesting call back to discuss change in medication

## 2022-10-30 ENCOUNTER — Ambulatory Visit: Payer: Medicare PPO | Admitting: Occupational Therapy

## 2022-11-01 ENCOUNTER — Ambulatory Visit: Payer: Medicare PPO | Admitting: Certified Registered Nurse Anesthetist

## 2022-11-01 ENCOUNTER — Ambulatory Visit
Admission: RE | Admit: 2022-11-01 | Discharge: 2022-11-01 | Disposition: A | Discharge status: Home / Self Care | Payer: Medicare PPO | Attending: Gastroenterology | Admitting: Gastroenterology

## 2022-11-01 ENCOUNTER — Other Ambulatory Visit: Payer: Self-pay

## 2022-11-01 ENCOUNTER — Encounter: Payer: Self-pay | Admitting: Gastroenterology

## 2022-11-01 ENCOUNTER — Encounter
Admission: RE | Disposition: A | Discharge status: Home / Self Care | Payer: Self-pay | Source: Home / Self Care | Attending: Gastroenterology

## 2022-11-01 DIAGNOSIS — R1310 Dysphagia, unspecified: Secondary | ICD-10-CM

## 2022-11-01 DIAGNOSIS — Z1211 Encounter for screening for malignant neoplasm of colon: Secondary | ICD-10-CM

## 2022-11-01 DIAGNOSIS — K317 Polyp of stomach and duodenum: Secondary | ICD-10-CM | POA: Insufficient documentation

## 2022-11-01 DIAGNOSIS — K640 First degree hemorrhoids: Secondary | ICD-10-CM | POA: Diagnosis not present

## 2022-11-01 DIAGNOSIS — K219 Gastro-esophageal reflux disease without esophagitis: Secondary | ICD-10-CM | POA: Diagnosis not present

## 2022-11-01 DIAGNOSIS — F32A Depression, unspecified: Secondary | ICD-10-CM | POA: Insufficient documentation

## 2022-11-01 DIAGNOSIS — E119 Type 2 diabetes mellitus without complications: Secondary | ICD-10-CM | POA: Insufficient documentation

## 2022-11-01 DIAGNOSIS — F419 Anxiety disorder, unspecified: Secondary | ICD-10-CM | POA: Insufficient documentation

## 2022-11-01 DIAGNOSIS — Z6841 Body Mass Index (BMI) 40.0 and over, adult: Secondary | ICD-10-CM | POA: Diagnosis not present

## 2022-11-01 DIAGNOSIS — J45909 Unspecified asthma, uncomplicated: Secondary | ICD-10-CM | POA: Diagnosis not present

## 2022-11-01 HISTORY — PX: COLONOSCOPY WITH PROPOFOL: SHX5780

## 2022-11-01 HISTORY — PX: ESOPHAGOGASTRODUODENOSCOPY (EGD) WITH PROPOFOL: SHX5813

## 2022-11-01 HISTORY — PX: BIOPSY: SHX5522

## 2022-11-01 LAB — GLUCOSE, CAPILLARY: Glucose-Capillary: 87 mg/dL (ref 70–99)

## 2022-11-01 SURGERY — COLONOSCOPY WITH PROPOFOL
Anesthesia: General

## 2022-11-01 MED ORDER — GLYCOPYRROLATE 0.2 MG/ML IJ SOLN
INTRAMUSCULAR | Status: AC
  Filled 2022-11-01: qty 1

## 2022-11-01 MED ORDER — LIDOCAINE HCL (CARDIAC) PF 100 MG/5ML IV SOSY
PREFILLED_SYRINGE | INTRAVENOUS | Status: DC | PRN
  Administered 2022-11-01: 100 mg via INTRAVENOUS

## 2022-11-01 MED ORDER — PROPOFOL 10 MG/ML IV BOLUS
INTRAVENOUS | Status: DC | PRN
  Administered 2022-11-01: 60 mg via INTRAVENOUS

## 2022-11-01 MED ORDER — SODIUM CHLORIDE 0.9 % IV SOLN: INTRAVENOUS | Status: DC

## 2022-11-01 MED ORDER — PROPOFOL 1000 MG/100ML IV EMUL
INTRAVENOUS | Status: AC
  Filled 2022-11-01: qty 300

## 2022-11-01 MED ORDER — PROPOFOL 500 MG/50ML IV EMUL
INTRAVENOUS | Status: DC | PRN
  Administered 2022-11-01: 160 ug/kg/min via INTRAVENOUS

## 2022-11-01 MED ORDER — LIDOCAINE HCL (PF) 2 % IJ SOLN
INTRAMUSCULAR | Status: AC
  Filled 2022-11-01: qty 20

## 2022-11-01 NOTE — Op Note (Signed)
[facility] Patient Name: Mackenzie Bonilla Procedure Date: 11/01/2022 6:59 AM MRN: [id number] Account #: [id number] Date of Birth: 07-26-1965 Admit Type: Outpatient Age: 57 Room: [facility] ENDO ROOM 4 Gender: Female Note Status: Finalized Instrument Name: Prentice Docker [id number] Procedure:             Colonoscopy Indications:           Screening for colorectal malignant neoplasm Providers:             Midge Minium MD, MD Referring MD:          Audrie Gallus. Tower (Referring MD) Medicines:             Propofol per Anesthesia Complications:         No immediate complications. Procedure:             Pre-Anesthesia Assessment:                        - Prior to the procedure, a History and Physical was                         performed, and patient medications and allergies were                         reviewed. The patient's tolerance of previous                         anesthesia was also reviewed. The risks and benefits                         of the procedure and the sedation options and risks                         were discussed with the patient. All questions were                         answered, and informed consent was obtained. Prior                         Anticoagulants: The patient has taken no anticoagulant                         or antiplatelet agents. ASA Grade Assessment: II - A                         patient with mild systemic disease. After reviewing                         the risks and benefits, the patient was deemed in                         satisfactory condition to undergo the procedure.                        After obtaining informed consent, the colonoscope was                         passed under direct vision. Throughout the procedure,  the patient's blood pressure, pulse, and oxygen                         saturations were monitored continuously. The                         Colonoscope was introduced through  the anus and                         advanced to the the cecum, identified by appendiceal                         orifice and ileocecal valve. The colonoscopy was                         performed without difficulty. The patient tolerated                         the procedure well. The quality of the bowel                         preparation was excellent. Findings:      The perianal and digital rectal examinations were normal.      Non-bleeding internal hemorrhoids were found during retroflexion. The       hemorrhoids were Grade I (internal hemorrhoids that do not prolapse). Impression:            - Non-bleeding internal hemorrhoids.                        - No specimens collected. Recommendation:        - Discharge patient to home.                        - Resume previous diet.                        - Continue present medications.                        - Repeat colonoscopy in 10 years for screening                         purposes. Procedure Code(s):     --- Professional ---                        [phone], Colonoscopy, flexible; diagnostic, including                         collection of specimen(s) by brushing or washing, when                         performed (separate procedure) Diagnosis Code(s):     --- Professional ---                        Z12.11, Encounter for screening for malignant neoplasm                         of colon CPT copyright 2022 [employer]. All rights reserved. The  codes documented in this report are preliminary and upon coder review may  be revised to meet current compliance requirements. Midge Minium MD, MD 11/01/2022 8:31:11 AM This report has been signed electronically. Number of Addenda: 0 Note Initiated On: 11/01/2022 6:59 AM Scope Withdrawal Time: 0 hours 9 minutes 3 seconds  Total Procedure Duration: 0 hours 14 minutes 58 seconds  Estimated Blood Loss:  Estimated blood loss: none.      [facility]

## 2022-11-01 NOTE — Transfer of Care (Signed)
Immediate Anesthesia Transfer of Care Note  Patient: Mackenzie Bonilla  Procedure(s) Performed: COLONOSCOPY WITH PROPOFOL ESOPHAGOGASTRODUODENOSCOPY (EGD) WITH PROPOFOL BIOPSY  Patient Location: PACU  Anesthesia Type:General  Level of Consciousness: awake, alert , and oriented  Airway & Oxygen Therapy: Patient Spontanous Breathing and Patient connected to nasal cannula oxygen  Post-op Assessment: Report given to RN and Post -op Vital signs reviewed and stable  Post vital signs: Reviewed and stable  Last Vitals:  Vitals Value Taken Time  BP 109/63   Temp    Pulse 72   Resp 16   SpO2 96     Last Pain: There were no vitals filed for this visit.       Complications: No notable events documented.

## 2022-11-01 NOTE — Anesthesia Procedure Notes (Signed)
Date/Time: 11/01/2022 8:04 AM  Performed by: Malva Cogan, CRNAPre-anesthesia Checklist: Patient identified, Emergency Drugs available, Suction available, Patient being monitored and Timeout performed Patient Re-evaluated:Patient Re-evaluated prior to induction Oxygen Delivery Method: Nasal cannula Induction Type: IV induction Placement Confirmation: CO2 detector and positive ETCO2

## 2022-11-01 NOTE — H&P (Signed)
Midge Minium, MD [facility] [address] [place], [place] [address] Phone:[phone] Fax : [phone]  Primary Care Physician:  Tower, Audrie Gallus, MD Primary Gastroenterologist:  Dr. Servando Snare  Pre-Procedure History & Physical: HPI:  Mackenzie Bonilla is a 57 y.o. female is here for an endoscopy and colonoscopy.   Past Medical History:  Diagnosis Date   Allergy    allergic rhinitis   Anemia    Anxiety    Arthritis    osteoarthritis   Asthma    Claustrophobia    does not like oxygen mask covering face   Depression    Diabetes mellitus without complication (HCC)    Fall 2016   Fatty liver 07/2008   abd. ultrasound - fatty liver ; slt dilated cbd (no stones ) 06/10// abd.  ultrasound normal on 04/2006   Fibromyalgia    GERD (gastroesophageal reflux disease)    EGD negative 06/2001// EGD erythematous mucosa, polyp 08/2008   High uric acid in 24 hour urine specimen    Hyperhidrosis    Hyperlipidemia    Kidney stones    Lymphedema    Obesity    PCOS (polycystic ovarian syndrome)    Shortness of breath dyspnea    Tachycardia    TMJ (dislocation of temporomandibular joint)    UTI (lower urinary tract infection)     Past Surgical History:  Procedure Laterality Date   BREAST BIOPSY Right 2016   neg   BREAST LUMPECTOMY WITH RADIOACTIVE SEED LOCALIZATION Right 08/02/2014   Procedure: BREAST LUMPECTOMY WITH RADIOACTIVE SEED LOCALIZATION;  Surgeon: Emelia Loron, MD;  Location: [facility] OR;  Service: General;  Laterality: Right;   COLONOSCOPY  08/12/2012   Completed by Dr. Cecelia Byars, normal exam.    ENDOMETRIAL BIOPSY  01/2004   ESOPHAGOGASTRODUODENOSCOPY     ESOPHAGOGASTRODUODENOSCOPY (EGD) WITH PROPOFOL N/A 08/18/2019   Procedure: ESOPHAGOGASTRODUODENOSCOPY (EGD) WITH PROPOFOL;  Surgeon: Midge Minium, MD;  Location: [facility] ENDOSCOPY;  Service: Endoscopy;  Laterality: N/A;   FOOT SURGERY     SEPTOPLASTY     two times   TONSILLECTOMY     UTERINE FIBROID SURGERY  06/2005    ablation   uterine tumor  09/2001   VAGUS NERVE STIMULATOR INSERTION      Prior to Admission medications   Medication Sig Start Date End Date Taking? Authorizing Provider  albuterol (VENTOLIN HFA) 108 (90 Base) MCG/ACT inhaler INHALE 2 PUFFS INTO THE LUNGS EVERY 6 HOURS AS NEEDED FOR WHEEZING. 08/09/21  Yes Tower, Audrie Gallus, MD  ALPRAZolam Prudy Feeler) 1 MG tablet Take 2 mg by mouth at bedtime.   Yes [provider]  buPROPion (WELLBUTRIN XL) 150 MG 24 hr tablet Take 150 mg by mouth daily.   Yes [provider]  desvenlafaxine (PRISTIQ) 100 MG 24 hr tablet Take 100 mg by mouth daily. 03/12/18  Yes [provider]  fluticasone (FLONASE) 50 MCG/ACT nasal spray Place into both nostrils daily.   Yes [provider]  gabapentin (NEURONTIN) 100 MG capsule Take 100 mg by mouth in the morning.   Yes [provider]  gabapentin (NEURONTIN) 300 MG capsule Take 1 capsule (300 mg total) by mouth at bedtime. 07/10/22  Yes Felecia Shelling, DPM  losartan (COZAAR) 25 MG tablet  05/23/17  Yes [provider]  minoxidil (LONITEN) 2.5 MG tablet Take 1 tablet (2.5 mg total) by mouth daily. 06/12/22  Yes Willeen Niece, MD  omeprazole (PRILOSEC) 40 MG capsule Take 1 capsule (40 mg total) by mouth in  the morning and at bedtime. 08/30/22  Yes Celso Amy, PA-C  oxybutynin (DITROPAN XL) 15 MG 24 hr tablet TAKE 1 TABLET BY MOUTH DAILY 07/11/22  Yes Tower, Marne A, MD  pravastatin (PRAVACHOL) 20 MG tablet 1 TABLET BY MOUTH AT BEDTIME. 06/04/22  Yes Tower, Audrie Gallus, MD  spironolactone (ALDACTONE) 100 MG tablet TAKE 1 TABLET BY MOUTH DAILY 05/16/22  Yes Tower, Audrie Gallus, MD  traZODone (DESYREL) 150 MG tablet Take 300 mg by mouth at bedtime. 05/20/14  Yes [provider]  TRESIBA FLEXTOUCH 100 UNIT/ML FlexTouch Pen Inject 46 Units into the skin daily. 03/09/22  Yes [provider]  acetaminophen (TYLENOL) 500 MG tablet Take 500 mg by mouth every 6 (six) hours as needed.      [provider]  Bacillus Coagulans-Inulin (PROBIOTIC-PREBIOTIC) 1-250 BILLION-MG CAPS Take 1 Capful by mouth daily.    [provider]  Blood Glucose Monitoring Suppl (FIFTY50 GLUCOSE METER 2.0) w/Device KIT Use as directed 07/02/18   [provider]  cyclobenzaprine (FLEXERIL) 5 MG tablet TAKE 1 TABLET BY MOUTH 3 TIMES DAILY AS NEEDED FOR MUSCLE SPASMS 11/29/21   Tower, Audrie Gallus, MD  dimenhyDRINATE (DRAMAMINE) 50 MG tablet Take 50 mg by mouth every 8 (eight) hours as needed.    [provider]  docusate sodium (COLACE) 100 MG capsule Take 1 capsule (100 mg total) by mouth 2 (two) times daily. 01/03/22   Vanna Scotland, MD  ketoconazole (NIZORAL) 2 % cream Apply 1 Application topically daily as needed for irritation. 09/03/22   Tower, Audrie Gallus, MD  MOUNJARO 12.5 MG/0.5ML Pen Inject 12.5 mg into the skin once a week. 08/07/22   [provider]  Polyethyl Glycol-Propyl Glycol (SYSTANE OP) Apply 1 drop to eye daily as needed.    [provider]  Simethicone (GAS-X PO) Take by mouth as needed.    [provider]  traMADol (ULTRAM) 50 MG tablet TAKE ONE TABLET TWICE A DAY IF NEEDED FOR SEVERE PAIN 09/20/20   Tower, Audrie Gallus, MD  XEOMIN 50 units SOLR injection Inject 50 Units into the muscle every 3 (three) months. 05/18/22   [provider]  zinc oxide (BALMEX) 11.3 % CREA cream Apply 1 application topically 2 (two) times daily.    [provider]    Allergies as of 08/23/2022 - Review Complete 08/23/2022  Allergen Reaction Noted   Cephalexin  10/16/2006   Codeine  10/16/2006   Duloxetine  10/16/2006   Levaquin [levofloxacin] Nausea Only 12/25/2011   Lisinopril Cough 01/16/2017   Metformin and related Diarrhea 11/14/2011   Quetiapine Other (See Comments) 10/16/2006   Sertraline hcl     Triazolam  10/16/2006   Zaleplon  10/16/2006   Zocor [simvastatin - high dose]  07/05/2010   Zolpidem tartrate  10/16/2006   Zyrtec  [cetirizine]  11/15/2021   Hydrocodone Itching and Rash 10/16/2006   Norelgestromin-eth estradiol  10/16/2006    Family History  Problem Relation Age of Onset   Hypertension Father    Cancer Father        pancreatic cancer   Hypertension Sister    Heart disease Maternal Grandmother 69       MI   Diabetes Maternal Grandmother    Heart disease Paternal Grandmother 35       MI   Diabetes Paternal Grandmother    Breast cancer Neg Hx     Social History   Socioeconomic History   Marital status: Single    Spouse name:  Not on file   Number of children: Not on file   Years of education: Not on file   Highest education level: Bachelor's degree (e.g., BA, AB, BS)  Occupational History   Not on file  Tobacco Use   Smoking status: Never   Smokeless tobacco: Never  Vaping Use   Vaping status: Never Used  Substance and Sexual Activity   Alcohol use: No    Alcohol/week: 0.0 standard drinks of alcohol   Drug use: No   Sexual activity: Not Currently    Birth control/protection: None, Post-menopausal  Other Topics Concern   Not on file  Social History Narrative   Not on file   Social Determinants of Health   Financial Resource Strain: Patient Declined (05/31/2022)   Overall Financial Resource Strain (CARDIA)    Difficulty of Paying Living Expenses: Patient declined  Food Insecurity: Patient Declined (05/31/2022)   Hunger Vital Sign    Worried About Running Out of Food in the Last Year: Patient declined    Ran Out of Food in the Last Year: Patient declined  Transportation Needs: No Transportation Needs (05/31/2022)   PRAPARE - Administrator, Civil Service (Medical): No    Lack of Transportation (Non-Medical): No  Physical Activity: Inactive (05/31/2022)   Exercise Vital Sign    Days of Exercise per Week: 0 days    Minutes of Exercise per Session: 0 min  Stress: Stress Concern Present (05/31/2022)   [employer] of Occupational Health - Occupational Stress  Questionnaire    Feeling of Stress : Very much  Social Connections: Unknown (05/31/2022)   Social Connection and Isolation Panel [NHANES]    Frequency of Communication with Friends and Family: More than three times a week    Frequency of Social Gatherings with Friends and Family: Patient declined    Attends Religious Services: Patient declined    Database administrator or Organizations: No    Attends Engineer, structural: Not on file    Marital Status: Never married  Catering manager Violence: Not on file    Review of Systems: See HPI, otherwise negative ROS  Physical Exam: Wt 119 kg   BMI 45.03 kg/m  General:   Alert,  pleasant and cooperative in NAD Head:  Normocephalic and atraumatic. Neck:  Supple; no masses or thyromegaly. Lungs:  Clear throughout to auscultation.    Heart:  Regular rate and rhythm. Abdomen:  Soft, nontender and nondistended. Normal bowel sounds, without guarding, and without rebound.   Neurologic:  Alert and  oriented x4;  grossly normal neurologically.  Impression/Plan: Mackenzie Bonilla is here for an endoscopy and colonoscopy to be performed for dysphagia and screening  Risks, benefits, limitations, and alternatives regarding  endoscopy and colonoscopy have been reviewed with the patient.  Questions have been answered.  All parties agreeable.   Midge Minium, MD  11/01/2022, 7:56 AM

## 2022-11-01 NOTE — Anesthesia Postprocedure Evaluation (Signed)
Anesthesia Post Note  Patient: Mackenzie Bonilla  Procedure(s) Performed: COLONOSCOPY WITH PROPOFOL ESOPHAGOGASTRODUODENOSCOPY (EGD) WITH PROPOFOL BIOPSY  Patient location during evaluation: Endoscopy Anesthesia Type: General Level of consciousness: awake and alert Pain management: pain level controlled Vital Signs Assessment: post-procedure vital signs reviewed and stable Respiratory status: spontaneous breathing, nonlabored ventilation, respiratory function stable and patient connected to nasal cannula oxygen Cardiovascular status: blood pressure returned to baseline and stable Postop Assessment: no apparent nausea or vomiting Anesthetic complications: no   No notable events documented.   Last Vitals:  Vitals:   11/01/22 0843 11/01/22 0851  BP: 114/68 (!) 110/59  Pulse: 69 64  Resp: 20 14  Temp:    SpO2: 100% 98%    Last Pain:  Vitals:   11/01/22 0851  TempSrc:   PainSc: 0-No pain                 Louie Boston

## 2022-11-01 NOTE — Anesthesia Preprocedure Evaluation (Signed)
Anesthesia Evaluation  Patient identified by MRN, date of birth, ID band Patient awake    Reviewed: Allergy & Precautions, NPO status , Patient's Chart, lab work & pertinent test results  History of Anesthesia Complications Negative for: history of anesthetic complications  Airway Mallampati: III  TM Distance: >3 FB Neck ROM: full    Dental  (+) Chipped   Pulmonary shortness of breath and with exertion, asthma    Pulmonary exam normal        Cardiovascular + Peripheral Vascular Disease  Normal cardiovascular exam     Neuro/Psych  PSYCHIATRIC DISORDERS Anxiety Depression     Neuromuscular disease    GI/Hepatic Neg liver ROS,GERD  Medicated,,  Endo/Other  diabetes  Morbid obesity  Renal/GU Renal disease  negative genitourinary   Musculoskeletal  (+) Arthritis ,  Fibromyalgia -, narcotic dependent  Abdominal   Peds  Hematology  (+) Blood dyscrasia, anemia   Anesthesia Other Findings Past Medical History: No date: Allergy     Comment:  allergic rhinitis No date: Anemia No date: Anxiety No date: Arthritis     Comment:  osteoarthritis No date: Asthma No date: Claustrophobia     Comment:  does not like oxygen mask covering face No date: Depression No date: Diabetes mellitus without complication (HCC) 2016: Fall 07/2008: Fatty liver     Comment:  abd. ultrasound - fatty liver ; slt dilated cbd (no               stones ) 06/10// abd.  ultrasound normal on 04/2006 No date: Fibromyalgia No date: GERD (gastroesophageal reflux disease)     Comment:  EGD negative 06/2001// EGD erythematous mucosa, polyp               08/2008 No date: High uric acid in 24 hour urine specimen No date: Hyperhidrosis No date: Hyperlipidemia No date: Kidney stones No date: Lymphedema No date: Obesity No date: PCOS (polycystic ovarian syndrome) No date: Shortness of breath dyspnea No date: Tachycardia No date: TMJ (dislocation of  temporomandibular joint) No date: UTI (lower urinary tract infection)  Past Surgical History: 2016: BREAST BIOPSY; Right     Comment:  neg 08/02/2014: BREAST LUMPECTOMY WITH RADIOACTIVE SEED LOCALIZATION; Right     Comment:  Procedure: BREAST LUMPECTOMY WITH RADIOACTIVE SEED               LOCALIZATION;  Surgeon: Emelia Loron, MD;  Location:              [facility] OR;  Service: General;  Laterality: Right; 08/12/2012: COLONOSCOPY     Comment:  Completed by Dr. Cecelia Byars, normal exam.  01/2004: ENDOMETRIAL BIOPSY No date: ESOPHAGOGASTRODUODENOSCOPY 08/18/2019: ESOPHAGOGASTRODUODENOSCOPY (EGD) WITH PROPOFOL; N/A     Comment:  Procedure: ESOPHAGOGASTRODUODENOSCOPY (EGD) WITH               PROPOFOL;  Surgeon: Midge Minium, MD;  Location: [facility]               ENDOSCOPY;  Service: Endoscopy;  Laterality: N/A; No date: FOOT SURGERY No date: SEPTOPLASTY     Comment:  two times No date: TONSILLECTOMY 06/2005: UTERINE FIBROID SURGERY     Comment:  ablation 09/2001: uterine tumor No date: VAGUS NERVE STIMULATOR INSERTION  BMI    Body Mass Index: 45.03 kg/m      Reproductive/Obstetrics negative OB ROS  Anesthesia Physical Anesthesia Plan  ASA: 3  Anesthesia Plan: General   Post-op Pain Management: Minimal or no pain anticipated   Induction: Intravenous  PONV Risk Score and Plan: 2 and Propofol infusion and TIVA  Airway Management Planned: Natural Airway and Nasal Cannula  Additional Equipment:   Intra-op Plan:   Post-operative Plan:   Informed Consent: I have reviewed the patients History and Physical, chart, labs and discussed the procedure including the risks, benefits and alternatives for the proposed anesthesia with the patient or authorized representative who has indicated his/her understanding and acceptance.     Dental Advisory Given  Plan Discussed with: Anesthesiologist, CRNA and Surgeon  Anesthesia Plan Comments:  (Patient consented for risks of anesthesia including but not limited to:  - adverse reactions to medications - risk of airway placement if required - damage to eyes, teeth, lips or other oral mucosa - nerve damage due to positioning  - sore throat or hoarseness - Damage to heart, brain, nerves, lungs, other parts of body or loss of life  Patient voiced understanding.)       Anesthesia Quick Evaluation

## 2022-11-01 NOTE — Op Note (Signed)
[facility] Gastroenterology Patient Name: Mackenzie Bonilla Procedure Date: 11/01/2022 7:10 AM MRN: [id number] Account #: [id number] Date of Birth: 09-22-1965 Admit Type: Outpatient Age: 57 Room: [facility] ENDO ROOM 4 Gender: Female Note Status: Finalized Instrument Name: Upper Endoscope [id number] Procedure:             Upper GI endoscopy Indications:           Dysphagia Providers:             Midge Minium MD, MD Referring MD:          Audrie Gallus. Tower (Referring MD) Medicines:             Propofol per Anesthesia Complications:         No immediate complications. Procedure:             Pre-Anesthesia Assessment:                        - Prior to the procedure, a History and Physical was                         performed, and patient medications and allergies were                         reviewed. The patient's tolerance of previous                         anesthesia was also reviewed. The risks and benefits                         of the procedure and the sedation options and risks                         were discussed with the patient. All questions were                         answered, and informed consent was obtained. Prior                         Anticoagulants: The patient has taken no anticoagulant                         or antiplatelet agents. ASA Grade Assessment: II - A                         patient with mild systemic disease. After reviewing                         the risks and benefits, the patient was deemed in                         satisfactory condition to undergo the procedure.                        After obtaining informed consent, the endoscope was                         passed under direct vision. Throughout the procedure,  the patient's blood pressure, pulse, and oxygen                         saturations were monitored continuously. The Endoscope                         was introduced through the mouth, and advanced to  the                         second part of duodenum. The upper GI endoscopy was                         accomplished without difficulty. The patient tolerated                         the procedure well. Findings:      The examined esophagus was normal.      A single 4 mm sessile polyp with no stigmata of recent bleeding was       found in the gastric antrum. The polyp was removed with a cold biopsy       forceps. Resection and retrieval were complete.      The examined duodenum was normal. Impression:            - Normal esophagus.                        - A single gastric polyp. Resected and retrieved.                        - Normal examined duodenum. Recommendation:        - Discharge patient to home.                        - Resume previous diet.                        - Continue present medications.                        - Await pathology results.                        - Perform a colonoscopy today. Procedure Code(s):     --- Professional ---                        [phone], Esophagogastroduodenoscopy, flexible,                         transoral; with biopsy, single or multiple Diagnosis Code(s):     --- Professional ---                        R13.10, Dysphagia, unspecified                        K31.7, Polyp of stomach and duodenum CPT copyright 2022 American Medical Association. All rights reserved. The codes documented in this report are preliminary and upon coder review may  be revised to meet current compliance requirements. Midge Minium MD, MD 11/01/2022 8:11:28 AM This report has been signed electronically. Number of  Addenda: 0 Note Initiated On: 11/01/2022 7:10 AM Estimated Blood Loss:  Estimated blood loss: none.      [facility]

## 2022-11-02 ENCOUNTER — Encounter: Payer: Self-pay | Admitting: Gastroenterology

## 2022-11-02 LAB — SURGICAL PATHOLOGY

## 2022-11-07 ENCOUNTER — Ambulatory Visit: Payer: Medicare PPO

## 2022-11-07 ENCOUNTER — Telehealth: Payer: Self-pay

## 2022-11-07 VITALS — Wt 255.0 lb

## 2022-11-07 DIAGNOSIS — Z0000 Encounter for general adult medical examination without abnormal findings: Secondary | ICD-10-CM | POA: Diagnosis not present

## 2022-11-07 NOTE — Patient Instructions (Addendum)
Mackenzie Bonilla , Thank you for taking time to come for your Medicare Wellness Visit. I appreciate your ongoing commitment to your health goals. Please review the following plan we discussed and let me know if I can assist you in the future.   Referrals/Orders/Follow-Ups/Clinician Recommendations: pt stated lose weight  and will follow up with flu vaccine   This is a list of the screening recommended for you and due dates:  Health Maintenance  Topic Date Due   HIV Screening  Never done   Hepatitis C Screening  Never done   Pap with HPV screening  03/04/2016   Yearly kidney health urinalysis for diabetes  04/26/2016   Hemoglobin A1C  10/18/2017   Eye exam for diabetics  01/25/2022   Flu Shot  09/13/2022   COVID-19 Vaccine (4 - 2023-24 season) 10/14/2022   Complete foot exam   01/10/2023   Yearly kidney function blood test for diabetes  07/18/2023   Medicare Annual Wellness Visit  11/07/2023   Mammogram  03/21/2024   DTaP/Tdap/Td vaccine (2 - Td or Tdap) 01/11/2025   Colon Cancer Screening  10/31/2032   Zoster (Shingles) Vaccine  Completed   HPV Vaccine  Aged Out    Advanced directives: (Declined) Advance directive discussed with you today. Even though you declined this today, please call our office should you change your mind, and we can give you the proper paperwork for you to fill out.  Next Medicare Annual Wellness Visit scheduled for next year: Yes

## 2022-11-07 NOTE — Progress Notes (Signed)
Subjective:   Mackenzie Bonilla is a 57 y.o. female who presents for Medicare Annual (Subsequent) preventive examination.  Visit Complete: Virtual  I connected with  Neita Goodnight on 11/07/22 by a audio enabled telemedicine application and verified that I am speaking with the correct person using two identifiers.  Patient Location: Home  Provider Location: Home Office  I discussed the limitations of evaluation and management by telemedicine. The patient expressed understanding and agreed to proceed.  Patient Medicare AWV questionnaire was completed by the patient on 11/01/22; I have confirmed that all information answered by patient is correct and no changes since this date.  Because this visit was a virtual/telehealth visit, some criteria may be missing or patient reported. Any vitals not documented were not able to be obtained and vitals that have been documented are patient reported.    Cardiac Risk Factors include: diabetes mellitus;dyslipidemia;obesity (BMI >30kg/m2)     Objective:    Today's Vitals   11/01/22 1433 11/07/22 1322  Weight:  255 lb (115.7 kg)  PainSc: 0-No pain    Body mass index is 43.77 kg/m.     11/01/2022    7:40 AM 08/25/2021    3:50 PM 08/04/2020   11:27 AM 04/20/2020    1:55 PM 01/04/2020   10:52 AM 12/29/2019    8:41 AM 12/08/2019    2:09 PM  Advanced Directives  Does Patient Have a Medical Advance Directive? No No No No No No No  Does patient want to make changes to medical advance directive?     Yes (MAU/Ambulatory/Procedural Areas - Information given)    Would patient like information on creating a medical advance directive? No - Patient declined   No - Patient declined  No - Patient declined No - Patient declined    Current Medications (verified) Outpatient Encounter Medications as of 11/07/2022  Medication Sig   acetaminophen (TYLENOL) 500 MG tablet Take 500 mg by mouth every 6 (six) hours as needed.    albuterol (VENTOLIN HFA) 108  (90 Base) MCG/ACT inhaler INHALE 2 PUFFS INTO THE LUNGS EVERY 6 HOURS AS NEEDED FOR WHEEZING.   ALPRAZolam (XANAX) 1 MG tablet Take 2 mg by mouth at bedtime.   Blood Glucose Monitoring Suppl (FIFTY50 GLUCOSE METER 2.0) w/Device KIT Use as directed   buPROPion (WELLBUTRIN XL) 150 MG 24 hr tablet Take 150 mg by mouth daily.   cyclobenzaprine (FLEXERIL) 5 MG tablet TAKE 1 TABLET BY MOUTH 3 TIMES DAILY AS NEEDED FOR MUSCLE SPASMS   desvenlafaxine (PRISTIQ) 100 MG 24 hr tablet Take 100 mg by mouth daily.   dimenhyDRINATE (DRAMAMINE) 50 MG tablet Take 50 mg by mouth every 8 (eight) hours as needed.   docusate sodium (COLACE) 100 MG capsule Take 1 capsule (100 mg total) by mouth 2 (two) times daily.   fluticasone (FLONASE) 50 MCG/ACT nasal spray Place into both nostrils daily.   gabapentin (NEURONTIN) 100 MG capsule Take 100 mg by mouth in the morning.   gabapentin (NEURONTIN) 300 MG capsule Take 1 capsule (300 mg total) by mouth at bedtime.   ketoconazole (NIZORAL) 2 % cream Apply 1 Application topically daily as needed for irritation.   losartan (COZAAR) 25 MG tablet    minoxidil (LONITEN) 2.5 MG tablet Take 1 tablet (2.5 mg total) by mouth daily.   MOUNJARO 12.5 MG/0.5ML Pen Inject 12.5 mg into the skin once a week.   omeprazole (PRILOSEC) 40 MG capsule Take 1 capsule (40 mg total) by mouth in the  morning and at bedtime.   oxybutynin (DITROPAN XL) 15 MG 24 hr tablet TAKE 1 TABLET BY MOUTH DAILY   Polyethyl Glycol-Propyl Glycol (SYSTANE OP) Apply 1 drop to eye daily as needed.   pravastatin (PRAVACHOL) 20 MG tablet 1 TABLET BY MOUTH AT BEDTIME.   Simethicone (GAS-X PO) Take by mouth as needed.   spironolactone (ALDACTONE) 100 MG tablet TAKE 1 TABLET BY MOUTH DAILY   traMADol (ULTRAM) 50 MG tablet TAKE ONE TABLET TWICE A DAY IF NEEDED FOR SEVERE PAIN   traZODone (DESYREL) 150 MG tablet Take 300 mg by mouth at bedtime.   TRESIBA FLEXTOUCH 100 UNIT/ML FlexTouch Pen Inject 46 Units into the skin daily.    zinc oxide (BALMEX) 11.3 % CREA cream Apply 1 application topically 2 (two) times daily.   Bacillus Coagulans-Inulin (PROBIOTIC-PREBIOTIC) 1-250 BILLION-MG CAPS Take 1 Capful by mouth daily. (Patient not taking: Reported on 11/07/2022)   XEOMIN 50 units SOLR injection Inject 50 Units into the muscle every 3 (three) months. (Patient not taking: Reported on 11/07/2022)   No facility-administered encounter medications on file as of 11/07/2022.    Allergies (verified) Cephalexin, Codeine, Duloxetine, Levaquin [levofloxacin], Lisinopril, Metformin and related, Quetiapine, Sertraline hcl, Triazolam, Zaleplon, Zocor [simvastatin - high dose], Zolpidem tartrate, Zyrtec [cetirizine], Hydrocodone, and Norelgestromin-eth estradiol   History: Past Medical History:  Diagnosis Date   Allergy    allergic rhinitis   Anemia    Anxiety    Arthritis    osteoarthritis   Asthma    Claustrophobia    does not like oxygen mask covering face   Depression    Diabetes mellitus without complication (HCC)    Fall 2016   Fatty liver 07/2008   abd. ultrasound - fatty liver ; slt dilated cbd (no stones ) 06/10// abd.  ultrasound normal on 04/2006   Fibromyalgia    GERD (gastroesophageal reflux disease)    EGD negative 06/2001// EGD erythematous mucosa, polyp 08/2008   High uric acid in 24 hour urine specimen    Hyperhidrosis    Hyperlipidemia    Kidney stones    Lymphedema    Obesity    PCOS (polycystic ovarian syndrome)    Shortness of breath dyspnea    Tachycardia    TMJ (dislocation of temporomandibular joint)    UTI (lower urinary tract infection)    Past Surgical History:  Procedure Laterality Date   BIOPSY  11/01/2022   Procedure: BIOPSY;  Surgeon: Midge Minium, MD;  Location: [facility] ENDOSCOPY;  Service: Endoscopy;;   BREAST BIOPSY Right 2016   neg   BREAST LUMPECTOMY WITH RADIOACTIVE SEED LOCALIZATION Right 08/02/2014   Procedure: BREAST LUMPECTOMY WITH RADIOACTIVE SEED LOCALIZATION;  Surgeon:  Emelia Loron, MD;  Location: [facility] OR;  Service: General;  Laterality: Right;   COLONOSCOPY  08/12/2012   Completed by Dr. Cecelia Byars, normal exam.    COLONOSCOPY WITH PROPOFOL N/A 11/01/2022   Procedure: COLONOSCOPY WITH PROPOFOL;  Surgeon: Midge Minium, MD;  Location: [facility] ENDOSCOPY;  Service: Endoscopy;  Laterality: N/A;   ENDOMETRIAL BIOPSY  01/2004   ESOPHAGOGASTRODUODENOSCOPY     ESOPHAGOGASTRODUODENOSCOPY (EGD) WITH PROPOFOL N/A 08/18/2019   Procedure: ESOPHAGOGASTRODUODENOSCOPY (EGD) WITH PROPOFOL;  Surgeon: Midge Minium, MD;  Location: [facility] ENDOSCOPY;  Service: Endoscopy;  Laterality: N/A;   ESOPHAGOGASTRODUODENOSCOPY (EGD) WITH PROPOFOL N/A 11/01/2022   Procedure: ESOPHAGOGASTRODUODENOSCOPY (EGD) WITH PROPOFOL;  Surgeon: Midge Minium, MD;  Location: [facility] ENDOSCOPY;  Service: Endoscopy;  Laterality: N/A;   FOOT SURGERY     SEPTOPLASTY  two times   TONSILLECTOMY     UTERINE FIBROID SURGERY  06/2005   ablation   uterine tumor  09/2001   VAGUS NERVE STIMULATOR INSERTION     Family History  Problem Relation Age of Onset   Hypertension Father    Cancer Father        pancreatic cancer   Hypertension Sister    Heart disease Maternal Grandmother 64       MI   Diabetes Maternal Grandmother    Heart disease Paternal Grandmother 53       MI   Diabetes Paternal Grandmother    Breast cancer Neg Hx    Social History   Socioeconomic History   Marital status: Single    Spouse name: Not on file   Number of children: Not on file   Years of education: Not on file   Highest education level: Bachelor's degree (e.g., BA, AB, BS)  Occupational History   Not on file  Tobacco Use   Smoking status: Never   Smokeless tobacco: Never  Vaping Use   Vaping status: Never Used  Substance and Sexual Activity   Alcohol use: No    Alcohol/week: 0.0 standard drinks of alcohol   Drug use: No   Sexual activity: Not Currently    Birth control/protection: None, Post-menopausal  Other Topics  Concern   Not on file  Social History Narrative   Not on file   Social Determinants of Health   Financial Resource Strain: Medium Risk (11/01/2022)   Overall Financial Resource Strain (CARDIA)    Difficulty of Paying Living Expenses: Somewhat hard  Food Insecurity: Food Insecurity Present (11/01/2022)   Hunger Vital Sign    Worried About Running Out of Food in the Last Year: Sometimes true    Ran Out of Food in the Last Year: Never true  Transportation Needs: No Transportation Needs (11/01/2022)   PRAPARE - Administrator, Civil Service (Medical): No    Lack of Transportation (Non-Medical): No  Physical Activity: Inactive (11/01/2022)   Exercise Vital Sign    Days of Exercise per Week: 0 days    Minutes of Exercise per Session: 0 min  Stress: Stress Concern Present (11/01/2022)   [employer] of Occupational Health - Occupational Stress Questionnaire    Feeling of Stress : Very much  Social Connections: Moderately Isolated (11/01/2022)   Social Connection and Isolation Panel [NHANES]    Frequency of Communication with Friends and Family: More than three times a week    Frequency of Social Gatherings with Friends and Family: Once a week    Attends Religious Services: 1 to 4 times per year    Active Member of [employer] or Organizations: No    Attends Engineer, structural: Never    Marital Status: Never married    Tobacco Counseling Counseling given: Not Answered   Clinical Intake:  Pre-visit preparation completed: Yes  Pain : No/denies pain Pain Score: 0-No pain     BMI - recorded: 43.77 Nutritional Status: BMI > 30  Obese Nutritional Risks: None Diabetes: Yes CBG done?: No Did pt. bring in CBG monitor from home?: No  How often do you need to have someone help you when you read instructions, pamphlets, or other written materials from your doctor or pharmacy?: 2 - Rarely  Interpreter Needed?: No  Information entered by :: Lanier Ensign,  LPN   Activities of Daily Living    11/01/2022    2:33 PM  In your  present state of health, do you have any difficulty performing the following activities:  Hearing? 0  Vision? 1  Difficulty concentrating or making decisions? 1  Walking or climbing stairs? 1  Comment uses cane  Dressing or bathing? 0  Doing errands, shopping? 0  Preparing Food and eating ? Y  Comment buy some meals  Using the Toilet? N  In the past six months, have you accidently leaked urine? N  Do you have problems with loss of bowel control? N  Managing your Medications? N  Managing your Finances? Y  Housekeeping or managing your Housekeeping? Y    Patient Care Team: Tower, Audrie Gallus, MD as PCP - General (Family Medicine)  Indicate any recent Medical Services you may have received from other than [facility] providers in the past year (date may be approximate).     Assessment:   This is a routine wellness examination for Akayla.  Hearing/Vision screen Hearing Screening - Comments:: Pt denies any hearing issues  Vision Screening - Comments:: Pt follows up with  eye center for annual eye exasm   Goals Addressed   None    Depression Screen    11/07/2022    1:29 PM 08/13/2022   12:20 PM 04/30/2022    2:32 PM 03/21/2022   11:05 AM 08/25/2021    3:52 PM 08/10/2020   11:17 AM 08/04/2020   11:26 AM  PHQ 2/9 Scores  PHQ - 2 Score 2 4 4 5 6 6  0  PHQ- 9 Score 6 21 19 20 14 24      Fall Risk    11/01/2022    2:33 PM 04/30/2022    2:31 PM 03/21/2022   11:05 AM 08/25/2021    3:51 PM 06/27/2021   10:12 AM  Fall Risk   Falls in the past year? 1 0 0 0 0  Number falls in past yr: 0 0 0 0   Injury with Fall? 0 0 0 0   Risk for fall due to : Impaired mobility;Impaired vision;Impaired balance/gait No Fall Risks Impaired mobility Impaired balance/gait;Impaired mobility;Medication side effect   Follow up Falls prevention discussed Falls evaluation completed Falls evaluation completed Falls evaluation  completed;Education provided;Falls prevention discussed     MEDICARE RISK AT HOME: Medicare Risk at Home Any stairs in or around the home?: No If so, are there any without handrails?: No Home free of loose throw rugs in walkways, pet beds, electrical cords, etc?: Yes Adequate lighting in your home to reduce risk of falls?: Yes Life alert?: No Use of a cane, walker or w/c?: Yes Grab bars in the bathroom?: Yes Shower chair or bench in shower?: Yes Elevated toilet seat or a handicapped toilet?: Yes  TIMED UP AND GO:  Was the test performed?  No    Cognitive Function:        11/07/2022    1:36 PM  6CIT Screen  What Year? 0 points  What month? 0 points  What time? 0 points  Count back from 20 0 points  Months in reverse 0 points  Repeat phrase 0 points  Total Score 0 points    Immunizations Immunization History  Administered Date(s) Administered   Influenza Whole 11/25/2001, 12/16/2008, 10/26/2011, 11/26/2020   Influenza,inj,Quad PF,6+ Mos 11/19/2012, 11/28/2013, 11/05/2014, 11/12/2016, 11/19/2017, 11/12/2018, 10/29/2019, 11/30/2021   PFIZER(Purple Top)SARS-COV-2 Vaccination 05/11/2019, 06/03/2019, 12/15/2019   Pneumococcal Polysaccharide-23 03/23/2009   Tdap 01/12/2015   Zoster Recombinant(Shingrix) 04/01/2020, 06/09/2020    TDAP status: Up to date  Flu Vaccine status: Due,  Education has been provided regarding the importance of this vaccine. Advised may receive this vaccine at local pharmacy or Health Dept. Aware to provide a copy of the vaccination record if obtained from local pharmacy or Health Dept. Verbalized acceptance and understanding.    Covid-19 vaccine status: Information provided on how to obtain vaccines.   Qualifies for Shingles Vaccine? Yes   Zostavax completed Yes   Shingrix Completed?: Yes  Screening Tests Health Maintenance  Topic Date Due   HIV Screening  Never done   Hepatitis C Screening  Never done   Cervical Cancer Screening (HPV/Pap  Cotest)  03/04/2016   Diabetic kidney evaluation - Urine ACR  04/26/2016   HEMOGLOBIN A1C  10/18/2017   OPHTHALMOLOGY EXAM  01/25/2022   COVID-19 Vaccine (4 - 2023-24 season) 10/14/2022   INFLUENZA VACCINE  05/13/2023 (Originally 09/13/2022)   FOOT EXAM  01/10/2023   Diabetic kidney evaluation - eGFR measurement  07/18/2023   Medicare Annual Wellness (AWV)  11/07/2023   MAMMOGRAM  03/21/2024   DTaP/Tdap/Td (2 - Td or Tdap) 01/11/2025   Colonoscopy  10/31/2032   Zoster Vaccines- Shingrix  Completed   HPV VACCINES  Aged Out    Health Maintenance  Health Maintenance Due  Topic Date Due   HIV Screening  Never done   Hepatitis C Screening  Never done   Cervical Cancer Screening (HPV/Pap Cotest)  03/04/2016   Diabetic kidney evaluation - Urine ACR  04/26/2016   HEMOGLOBIN A1C  10/18/2017   OPHTHALMOLOGY EXAM  01/25/2022   COVID-19 Vaccine (4 - 2023-24 season) 10/14/2022    Colorectal cancer screening: Type of screening: Colonoscopy. Completed 11/01/22. Repeat every 10 years  Mammogram status: Completed 03/21/22. Repeat every year     Additional Screening:  Hepatitis C Screening: does qualify  Vision Screening: Recommended annual ophthalmology exams for early detection of glaucoma and other disorders of the eye. Is the patient up to date with their annual eye exam?  No  Who is the provider or what is the name of the office in which the patient attends annual eye exams? [place]  If pt is not established with a provider, would they like to be referred to a provider to establish care? No .   Dental Screening: Recommended annual dental exams for proper oral hygiene  Diabetic Foot Exam: Diabetic Foot Exam: Completed 01/09/22  Community Resource Referral / Chronic Care Management: CRR required this visit?  No   CCM required this visit?  No     Plan:     I have personally reviewed and noted the following in the patient's chart:   Medical and social history Use of alcohol,  tobacco or illicit drugs  Current medications and supplements including opioid prescriptions. Patient is currently taking opioid prescriptions. Information provided to patient regarding non-opioid alternatives. Patient advised to discuss non-opioid treatment plan with their provider. Functional ability and status Nutritional status Physical activity Advanced directives List of other physicians Hospitalizations, surgeries, and ER visits in previous 12 months Vitals Screenings to include cognitive, depression, and falls Referrals and appointments  In addition, I have reviewed and discussed with patient certain preventive protocols, quality metrics, and best practice recommendations. A written personalized care plan for preventive services as well as general preventive health recommendations were provided to patient.     Marzella Schlein, LPN   [id number]   After Visit Summary: (MyChart) Due to this being a telephonic visit, the after visit summary with patients personalized plan was offered to patient via  MyChart   Nurse Notes: none

## 2022-11-07 NOTE — Telephone Encounter (Signed)
Patient wanted to let us know that it took a few days to have a bowel - we discussed continuing the Miralax and drinking plenty of fluids.Not having a bowel movement after Colonoscopy is normal and our bowels will start working again but due to your constipation continue with Miralax daily.

## 2022-11-08 ENCOUNTER — Ambulatory Visit: Payer: Medicare PPO | Admitting: Occupational Therapy

## 2022-11-08 DIAGNOSIS — M79642 Pain in left hand: Secondary | ICD-10-CM

## 2022-11-08 DIAGNOSIS — G5603 Carpal tunnel syndrome, bilateral upper limbs: Secondary | ICD-10-CM | POA: Diagnosis not present

## 2022-11-08 DIAGNOSIS — M79641 Pain in right hand: Secondary | ICD-10-CM | POA: Diagnosis not present

## 2022-11-08 NOTE — Therapy (Signed)
OUTPATIENT OCCUPATIONAL THERAPY ORTHO TREATMENT  Patient Name: Mackenzie Bonilla MRN: [id number] DOB:08/30/1965, 57 y.o., female Today's Date: 11/08/2022  PCP: DR Milinda Antis REFERRING PROVIDER: Dr Rosita Kea  END OF SESSION:  OT End of Session - 11/08/22 1342     Visit Number 6    Number of Visits 8    Date for OT Re-Evaluation 12/20/22    OT Start Time 1317    OT Stop Time 1400    OT Time Calculation (min) 43 min    Activity Tolerance Patient tolerated treatment well    Behavior During Therapy [facility] for tasks assessed/performed             Past Medical History:  Diagnosis Date   Allergy    allergic rhinitis   Anemia    Anxiety    Arthritis    osteoarthritis   Asthma    Claustrophobia    does not like oxygen mask covering face   Depression    Diabetes mellitus without complication (HCC)    Fall 2016   Fatty liver 07/2008   abd. ultrasound - fatty liver ; slt dilated cbd (no stones ) 06/10// abd.  ultrasound normal on 04/2006   Fibromyalgia    GERD (gastroesophageal reflux disease)    EGD negative 06/2001// EGD erythematous mucosa, polyp 08/2008   High uric acid in 24 hour urine specimen    Hyperhidrosis    Hyperlipidemia    Kidney stones    Lymphedema    Obesity    PCOS (polycystic ovarian syndrome)    Shortness of breath dyspnea    Tachycardia    TMJ (dislocation of temporomandibular joint)    UTI (lower urinary tract infection)    Past Surgical History:  Procedure Laterality Date   BIOPSY  11/01/2022   Procedure: BIOPSY;  Surgeon: Midge Minium, MD;  Location: [facility] ENDOSCOPY;  Service: Endoscopy;;   BREAST BIOPSY Right 2016   neg   BREAST LUMPECTOMY WITH RADIOACTIVE SEED LOCALIZATION Right 08/02/2014   Procedure: BREAST LUMPECTOMY WITH RADIOACTIVE SEED LOCALIZATION;  Surgeon: Emelia Loron, MD;  Location: [facility] OR;  Service: General;  Laterality: Right;   COLONOSCOPY  08/12/2012   Completed by Dr. Cecelia Byars, normal exam.    COLONOSCOPY WITH PROPOFOL N/A 11/01/2022    Procedure: COLONOSCOPY WITH PROPOFOL;  Surgeon: Midge Minium, MD;  Location: [facility] ENDOSCOPY;  Service: Endoscopy;  Laterality: N/A;   ENDOMETRIAL BIOPSY  01/2004   ESOPHAGOGASTRODUODENOSCOPY     ESOPHAGOGASTRODUODENOSCOPY (EGD) WITH PROPOFOL N/A 08/18/2019   Procedure: ESOPHAGOGASTRODUODENOSCOPY (EGD) WITH PROPOFOL;  Surgeon: Midge Minium, MD;  Location: [facility] ENDOSCOPY;  Service: Endoscopy;  Laterality: N/A;   ESOPHAGOGASTRODUODENOSCOPY (EGD) WITH PROPOFOL N/A 11/01/2022   Procedure: ESOPHAGOGASTRODUODENOSCOPY (EGD) WITH PROPOFOL;  Surgeon: Midge Minium, MD;  Location: [facility] ENDOSCOPY;  Service: Endoscopy;  Laterality: N/A;   FOOT SURGERY     SEPTOPLASTY     two times   TONSILLECTOMY     UTERINE FIBROID SURGERY  06/2005   ablation   uterine tumor  09/2001   VAGUS NERVE STIMULATOR INSERTION     Patient Active Problem List   Diagnosis Date Noted   Dysphagia 11/01/2022   Gastric polyps 11/01/2022   Nontraumatic tear of skin 09/05/2022   Adverse effect of proton pump inhibitor 07/18/2022   Colon cancer screening 07/18/2022   Nasal congestion 04/30/2022   Breast asymmetry 02/21/2022   Muscle twitch 02/21/2022   Abdominal pannus 02/21/2022   Recurrent UTI 12/21/2021   Paresthesia 11/15/2021   Ingrown nail of great toe  of left foot 11/15/2021   Keratosis 08/31/2021   Hair loss 06/19/2021   Breast pain 12/20/2020   Encounter for screening mammogram for breast cancer 12/13/2020   Lumbar facet arthropathy 12/29/2019   Chronic bilateral thoracic back pain 12/08/2019   Lumbar degenerative disc disease 12/08/2019   Fibromyalgia 12/08/2019   Chronic pain syndrome 12/08/2019   Lumbar radicular pain (left S1) 12/08/2019   Mid back pain 02/11/2019   Multiple joint pain 11/12/2018   Gallstones 04/19/2017   Diabetic polyneuropathy associated with type 2 diabetes mellitus (HCC) 04/07/2017   Diabetic angiopathy (HCC) 04/07/2017   Vasomotor symptoms due to menopause 04/07/2017   Mobility  impaired 11/12/2016   Urinary incontinence 12/02/2015   Candidal intertrigo 04/06/2015   Cystocele 04/06/2015   Constipation 04/06/2015   Myofascial pain syndrome of lumbar spine 12/28/2014   Hypertriglyceridemia 10/14/2014   Abdominal pain 04/23/2012   Uric acid kidney stone 11/20/2011   HYPERHIDROSIS 11/07/2009   Menopausal symptoms 06/17/2009   Type 2 diabetes mellitus without complication, without long-term current use of insulin (HCC) 08/23/2008   Vitamin D deficiency 05/07/2008   Vitamin B 12 deficiency 06/04/2007   CHRONIC FATIGUE SYNDROME 11/15/2006   DIVERTICULITIS, HX OF 11/15/2006   PCOS (polycystic ovarian syndrome) 10/16/2006   Hyperlipidemia associated with type 2 diabetes mellitus (HCC) 10/16/2006   Morbid obesity (HCC) 10/16/2006   Generalized anxiety disorder 10/16/2006   PANIC DISORDER 10/16/2006   BULIMIA 10/16/2006   Chronic depression 10/16/2006   CARPAL TUNNEL SYNDROME 10/16/2006   LYMPHEDEMA 10/16/2006   Allergic rhinitis 10/16/2006   Mild intermittent asthma 10/16/2006   TMJ SYNDROME 10/16/2006   GERD 10/16/2006   Irritable bowel syndrome 10/16/2006   Osteoarthritis 10/16/2006   COUGH, CHRONIC 10/16/2006    ONSET DATE: 6 months  REFERRING DIAG: Bil CTS  THERAPY DIAG:  Bilateral hand pain  Bilateral carpal tunnel syndrome  Rationale for Evaluation and Treatment: Rehabilitation  SUBJECTIVE:   SUBJECTIVE STATEMENT: I had colonoscopy 2 days ago- and then push trash can out this am - so had some wrist and hand pain - but better with spasm , built up pen, trying to keep IPAD propping up Pt accompanied by: self  PERTINENT HISTORY: DR Rosita Kea last note: Mackenzie Bonilla is a 58 y.o. female here today for follow-up after a CT of the cervical spine. I thought she had possible severe stenosis because she was having severe symptoms down the arm. However, the nerve test showed mild carpal tunnel. EMG did not show any significant stenosis. It showed  degenerative disc disease with some small osteophytes, but no definite findings of neural foraminal stenosis. She comes in to review this test.  She reports experiencing shoulder pain that radiates down her arm, accompanied by swelling around the wrist. She also mentions a scrape on her wrist, and her fingers continue to curl. The pain intensifies with increased activity during the day. When she wears the splint, she experiences pain from the pressure of it. Three months ago, she fell and has pain when she bends her elbow.   She occasionally experiences static pain in her left hand, but the shooting pain has lessened. Bending her fingers or rubbing her hand triggers anxiety. She is currently unemployed and disabled. She has not received hand therapy for this condition. When she removes the braces, she experiences pain within the elbow. When she initially started performing the hand exercises that was provided, she would experience pain that would persist all day.   She also reports  a throbbing pain in the middle of her cervical spine. Her pain management doctor suggested that her pain in the middle is due to arthritis. She denies receiving any carpal tunnel injections.    PRECAUTIONS: None     WEIGHT BEARING RESTRICTIONS: No  PAIN:  Are you having pain? No pain reported today except with prehension strength assessment   FALLS: Has patient fallen in last 6 months? No  LIVING ENVIRONMENT: Lives with: lives with their family and lives alone   PLOF: Has depression- gets food deliver, watch tv and on tablet playing games  PATIENT GOALS: Get the pain better in my hands and wrist   NEXT MD VISIT: no follow up date  OBJECTIVE:   HAND DOMINANCE: Right  A  UPPER EXTREMITY ROM:     Active ROM Right eval Left eval 11/08/22 R  Shoulder flexion     Shoulder abduction     Shoulder adduction     Shoulder extension     Shoulder internal rotation     Shoulder external rotation     Elbow  flexion     Elbow extension     Wrist flexion 90 pain   90  Wrist extension 50pain   70  Wrist ulnar deviation 30  30  Wrist radial deviation 20  20  Wrist pronation     Wrist supination     (Blank rows = not tested)  Active ROM Right eval R 10/18/22  Thumb MCP (0-60)    Thumb IP (0-80)    Thumb Radial abd/add (0-55) 45 50  Thumb Palmar abd/add (0-45) 55 55  Thumb Opposition to Small Finger    Index MCP (0-90)    Index PIP (0-100)    Index DIP (0-70)     Long MCP (0-90)     Long PIP (0-100)     Long DIP (0-70)     Ring MCP (0-90)     Ring PIP (0-100)     Ring DIP (0-70)     Little MCP (0-90)     Little PIP (0-100)     Little DIP (0-70)     (Blank rows = not tested)    HAND FUNCTION: Grip strength: Right: 30 pain lbs; Left: 32 pain lbs, Lateral pinch: Right: 14 lbs, Left: 17 lbs, and 3 point pinch: Right: 11 pain  lbs, Left: 11pain lbs 10/18/22 FOLLOW UP  Grip strength 11/08/22  Right: 35 pain lbs; Left: 40 pain lbs, Lateral pinch: Right: 17 lbs, Left: 17 lbs, and 3 point pinch: Right: 10 pain  lbs, Left: 11pain lbs   SENSATION: Report numbness in all digits at times  EDEMA:report increase edema in R hand during day but fluctuate  COGNITION: Overall cognitive status: Within functional limits for tasks assessed     TODAY'S TREATMENT:  DATE: 10/08/22 Fluido therapy done on bilateral hands prior to soft tissue massage to volar wrist and forearm -and hand prior to [facility] for digits and wrist flexion and extention  Great progress in R wrist ext  Grip and prehension still increase  Pt cont with CMC neoprene to use on R thumb and wrist - wearing and support thumb and wrist with functional use  decrease pain and numbness- fitted with med plus on the L Review again with pt  positioning and modifications to task to decrease numbness and pain in bilateral  hands with using her IPAD and phone - stylus Pt cont to enlarge grip on stylus -using it on IPAD patient to avoid tight prolonged grip-or pinch She is propping her tablet as well as cell phone. But need to support arm to no keep shoulder up and causing upper trap issues  To decrease also cervical flexion. Pt to use pump bottles for soap and hair produce And then ring  or  handle for cell phone - review again Patient to cont with do contrast to 2-3 times a day on bilateral hands and carpal tunnel, use soft CMC neoprene  and cont  modifications   PATIENT EDUCATION: Education details: progress and modifications and splint wearing Person educated: Patient Education method: Explanation, Demonstration, Tactile cues, Verbal cues, and Handouts Education comprehension: verbalized understanding, returned demonstration, verbal cues required, tactile cues required, and needs further education    LONG TERM GOALS: Target date 3 wks  Patient to be independent in home program to decrease pain at rest less than a 2/10 during the day. Baseline: Pain 5/10 at rest at wrist and radial wrist into thumb.  Increases during the day with use.- NOW pain still increase with gripping , lifting and carrying  3/10  Goal status: progressing   2.  Patient reports 3 modifications adaptive equipment to decrease numbness and decrease pain in right wrist and thumb. Baseline: Pt got some adaptive equipment and  one some modifications  to decrease numbness and pain 3/10  Goal status: progressing  3.  Grip increased more than 5 pounds with less pain for patient to drop objects with less Baseline: Pain at volar wrist with grip strength 30 pounds on the right left 32 pounds NOW great progress - see flowsheet  Goal status: MET  ASSESSMENT:  CLINICAL IMPRESSION: Patient present at occupational therapy evaluation with a diagnosis of bilateral carpal tunnel.  The patient had 10 years ago bilateral carpal tunnel release.   Patient reports right hand worse than left.  Tenderness and pain mostly over carpal tunnel as well as right radial wrist into thumb.  Tenderness and pain over posterior elbow.  Negative Tinel at cubital tunnel.  Patient report pain at eval 5/10 but increased as the day goes.  Report also numbness in all 5 fingers at times.  Patient made great progress since evaluation in grip and lateral pinch especially in the right.  Patient report decreased spasms or tightness in the right hand compared to evaluation. Pt to cont to benefit from use and wearing of  CMC neoprene on the bil hands.  Patient implemented a lot of modifications as well as adaptive equipment -and reviewed this date again modifications and joint protection to decrease carpal tunnel symptoms and pain in wrist and hand.  Patient can benefit from skilled OT services to decrease edema and pain increase motion and strength in bilateral hands. PERFORMANCE DEFICITS: in functional skills including ADLs, IADLs, edema, ROM, strength, pain, flexibility, and  UE functional use, cognitive skills including energy/drive and thought, and psychosocial skills including coping strategies, environmental adaptation, habits, and routines and behaviors.   IMPAIRMENTS: are limiting patient from ADLs, IADLs, rest and sleep, play, leisure, and social participation.   COMORBIDITIES: may have co-morbidities  that affects occupational performance. Patient will benefit from skilled OT to address above impairments and improve overall function.  MODIFICATION OR ASSISTANCE TO COMPLETE EVALUATION: Min-Moderate modification of tasks or assist with assess necessary to complete an evaluation.  OT OCCUPATIONAL PROFILE AND HISTORY: Detailed assessment: Review of records and additional review of physical, cognitive, psychosocial history related to current functional performance.  CLINICAL DECISION MAKING: Moderate - several treatment options, min-mod task modification  necessary  REHAB POTENTIAL: [facility]- depression - [facility] in past   EVALUATION COMPLEXITY: Moderate      PLAN:  OT FREQUENCY: every 3 wks   OT DURATION: 6 wks   PLANNED INTERVENTIONS: therapeutic exercise, manual therapy, splinting, ultrasound, paraffin, fluidotherapy, contrast bath, patient/family education, energy conservation, and DME and/or AE instructions    CONSULTED AND AGREED WITH PLAN OF CARE: Patient      Oletta Cohn, OTR/L,CLT 11/08/2022, 3:24 PM

## 2022-11-12 DIAGNOSIS — E119 Type 2 diabetes mellitus without complications: Secondary | ICD-10-CM | POA: Diagnosis not present

## 2022-11-13 ENCOUNTER — Ambulatory Visit: Payer: Medicare PPO | Admitting: Physician Assistant

## 2022-11-20 ENCOUNTER — Other Ambulatory Visit: Payer: Self-pay | Admitting: Dermatology

## 2022-11-20 ENCOUNTER — Other Ambulatory Visit: Payer: Self-pay | Admitting: Family Medicine

## 2022-11-20 DIAGNOSIS — L649 Androgenic alopecia, unspecified: Secondary | ICD-10-CM

## 2022-11-27 ENCOUNTER — Ambulatory Visit: Payer: Medicare PPO | Attending: Orthopedic Surgery | Admitting: Occupational Therapy

## 2022-11-27 DIAGNOSIS — G5603 Carpal tunnel syndrome, bilateral upper limbs: Secondary | ICD-10-CM | POA: Insufficient documentation

## 2022-11-27 DIAGNOSIS — M79642 Pain in left hand: Secondary | ICD-10-CM | POA: Insufficient documentation

## 2022-11-27 DIAGNOSIS — M79641 Pain in right hand: Secondary | ICD-10-CM | POA: Insufficient documentation

## 2022-11-27 NOTE — Therapy (Signed)
OUTPATIENT OCCUPATIONAL THERAPY ORTHO TREATMENT  Patient Name: Mackenzie Bonilla MRN: [id number] DOB:Sep 03, 1965, 57 y.o., female Today's Date: 11/27/2022  PCP: DR Milinda Antis REFERRING PROVIDER: Dr Rosita Kea  END OF SESSION:  OT End of Session - 11/27/22 1605     Visit Number 7    Number of Visits 8    Date for OT Re-Evaluation 12/20/22    OT Start Time 1402    OT Stop Time 1455    OT Time Calculation (min) 53 min    Activity Tolerance Patient tolerated treatment well    Behavior During Therapy [facility] for tasks assessed/performed             Past Medical History:  Diagnosis Date   Allergy    allergic rhinitis   Anemia    Anxiety    Arthritis    osteoarthritis   Asthma    Claustrophobia    does not like oxygen mask covering face   Depression    Diabetes mellitus without complication (HCC)    Fall 2016   Fatty liver 07/2008   abd. ultrasound - fatty liver ; slt dilated cbd (no stones ) 06/10// abd.  ultrasound normal on 04/2006   Fibromyalgia    GERD (gastroesophageal reflux disease)    EGD negative 06/2001// EGD erythematous mucosa, polyp 08/2008   High uric acid in 24 hour urine specimen    Hyperhidrosis    Hyperlipidemia    Kidney stones    Lymphedema    Obesity    PCOS (polycystic ovarian syndrome)    Shortness of breath dyspnea    Tachycardia    TMJ (dislocation of temporomandibular joint)    UTI (lower urinary tract infection)    Past Surgical History:  Procedure Laterality Date   BIOPSY  11/01/2022   Procedure: BIOPSY;  Surgeon: Midge Minium, MD;  Location: [facility] ENDOSCOPY;  Service: Endoscopy;;   BREAST BIOPSY Right 2016   neg   BREAST LUMPECTOMY WITH RADIOACTIVE SEED LOCALIZATION Right 08/02/2014   Procedure: BREAST LUMPECTOMY WITH RADIOACTIVE SEED LOCALIZATION;  Surgeon: Emelia Loron, MD;  Location: [facility] OR;  Service: General;  Laterality: Right;   COLONOSCOPY  08/12/2012   Completed by Dr. Cecelia Byars, normal exam.    COLONOSCOPY WITH PROPOFOL N/A 11/01/2022    Procedure: COLONOSCOPY WITH PROPOFOL;  Surgeon: Midge Minium, MD;  Location: [facility] ENDOSCOPY;  Service: Endoscopy;  Laterality: N/A;   ENDOMETRIAL BIOPSY  01/2004   ESOPHAGOGASTRODUODENOSCOPY     ESOPHAGOGASTRODUODENOSCOPY (EGD) WITH PROPOFOL N/A 08/18/2019   Procedure: ESOPHAGOGASTRODUODENOSCOPY (EGD) WITH PROPOFOL;  Surgeon: Midge Minium, MD;  Location: [facility] ENDOSCOPY;  Service: Endoscopy;  Laterality: N/A;   ESOPHAGOGASTRODUODENOSCOPY (EGD) WITH PROPOFOL N/A 11/01/2022   Procedure: ESOPHAGOGASTRODUODENOSCOPY (EGD) WITH PROPOFOL;  Surgeon: Midge Minium, MD;  Location: [facility] ENDOSCOPY;  Service: Endoscopy;  Laterality: N/A;   FOOT SURGERY     SEPTOPLASTY     two times   TONSILLECTOMY     UTERINE FIBROID SURGERY  06/2005   ablation   uterine tumor  09/2001   VAGUS NERVE STIMULATOR INSERTION     Patient Active Problem List   Diagnosis Date Noted   Dysphagia 11/01/2022   Gastric polyps 11/01/2022   Nontraumatic tear of skin 09/05/2022   Adverse effect of proton pump inhibitor 07/18/2022   Colon cancer screening 07/18/2022   Nasal congestion 04/30/2022   Breast asymmetry 02/21/2022   Muscle twitch 02/21/2022   Abdominal pannus 02/21/2022   Recurrent UTI 12/21/2021   Paresthesia 11/15/2021   Ingrown nail of great toe  of left foot 11/15/2021   Keratosis 08/31/2021   Hair loss 06/19/2021   Breast pain 12/20/2020   Encounter for screening mammogram for breast cancer 12/13/2020   Lumbar facet arthropathy 12/29/2019   Chronic bilateral thoracic back pain 12/08/2019   Lumbar degenerative disc disease 12/08/2019   Fibromyalgia 12/08/2019   Chronic pain syndrome 12/08/2019   Lumbar radicular pain (left S1) 12/08/2019   Mid back pain 02/11/2019   Multiple joint pain 11/12/2018   Gallstones 04/19/2017   Diabetic polyneuropathy associated with type 2 diabetes mellitus (HCC) 04/07/2017   Diabetic angiopathy (HCC) 04/07/2017   Vasomotor symptoms due to menopause 04/07/2017   Mobility  impaired 11/12/2016   Urinary incontinence 12/02/2015   Candidal intertrigo 04/06/2015   Cystocele 04/06/2015   Constipation 04/06/2015   Myofascial pain syndrome of lumbar spine 12/28/2014   Hypertriglyceridemia 10/14/2014   Abdominal pain 04/23/2012   Uric acid kidney stone 11/20/2011   HYPERHIDROSIS 11/07/2009   Menopausal symptoms 06/17/2009   Type 2 diabetes mellitus without complication, without long-term current use of insulin (HCC) 08/23/2008   Vitamin D deficiency 05/07/2008   Vitamin B 12 deficiency 06/04/2007   CHRONIC FATIGUE SYNDROME 11/15/2006   DIVERTICULITIS, HX OF 11/15/2006   PCOS (polycystic ovarian syndrome) 10/16/2006   Hyperlipidemia associated with type 2 diabetes mellitus (HCC) 10/16/2006   Morbid obesity (HCC) 10/16/2006   Generalized anxiety disorder 10/16/2006   PANIC DISORDER 10/16/2006   BULIMIA 10/16/2006   Chronic depression 10/16/2006   CARPAL TUNNEL SYNDROME 10/16/2006   LYMPHEDEMA 10/16/2006   Allergic rhinitis 10/16/2006   Mild intermittent asthma 10/16/2006   Temporomandibular joint disorder 10/16/2006   GERD 10/16/2006   Irritable bowel syndrome 10/16/2006   Osteoarthritis 10/16/2006   COUGH, CHRONIC 10/16/2006    ONSET DATE: 6 months  REFERRING DIAG: Bil CTS  THERAPY DIAG:  Bilateral hand pain  Bilateral carpal tunnel syndrome  Rationale for Evaluation and Treatment: Rehabilitation  SUBJECTIVE:   SUBJECTIVE STATEMENT: I cleaned my office out , setup new printer and shredder - took old ones out and took trash out before I came- my L thumb and fingers hurt so bad last night after I have done that- did not wear my soft black splints- and my neck hurts down to my shoulder blade on the R  Pt accompanied by: self  PERTINENT HISTORY: DR Rosita Kea last note: Mackenzie Bonilla is a 57 y.o. female here today for follow-up after a CT of the cervical spine. I thought she had possible severe stenosis because she was having severe symptoms down  the arm. However, the nerve test showed mild carpal tunnel. EMG did not show any significant stenosis. It showed degenerative disc disease with some small osteophytes, but no definite findings of neural foraminal stenosis. She comes in to review this test.  She reports experiencing shoulder pain that radiates down her arm, accompanied by swelling around the wrist. She also mentions a scrape on her wrist, and her fingers continue to curl. The pain intensifies with increased activity during the day. When she wears the splint, she experiences pain from the pressure of it. Three months ago, she fell and has pain when she bends her elbow.   She occasionally experiences static pain in her left hand, but the shooting pain has lessened. Bending her fingers or rubbing her hand triggers anxiety. She is currently unemployed and disabled. She has not received hand therapy for this condition. When she removes the braces, she experiences pain within the elbow. When she initially  started performing the hand exercises that was provided, she would experience pain that would persist all day.   She also reports a throbbing pain in the middle of her cervical spine. Her pain management doctor suggested that her pain in the middle is due to arthritis. She denies receiving any carpal tunnel injections.    PRECAUTIONS: None     WEIGHT BEARING RESTRICTIONS: No  PAIN:  Are you having pain? Yes both my thumbs but the L worse - usually okay   FALLS: Has patient fallen in last 6 months? No  LIVING ENVIRONMENT: Lives with: Lives alone   PLOF: Has depression- gets food deliver, watch tv and on tablet playing games  PATIENT GOALS: Get the pain better in my hands and wrist   NEXT MD VISIT: encourage pt to call ortho office -possible shot for thumb CMC's or CT depending on symptoms   OBJECTIVE:   HAND DOMINANCE: Right  A  UPPER EXTREMITY ROM:     Active ROM Right eval Left eval 11/08/22 R  Shoulder flexion      Shoulder abduction     Shoulder adduction     Shoulder extension     Shoulder internal rotation     Shoulder external rotation     Elbow flexion     Elbow extension     Wrist flexion 90 pain   90  Wrist extension 50pain   70  Wrist ulnar deviation 30  30  Wrist radial deviation 20  20  Wrist pronation     Wrist supination     (Blank rows = not tested)  Active ROM Right eval R 10/18/22  Thumb MCP (0-60)    Thumb IP (0-80)    Thumb Radial abd/add (0-55) 45 50  Thumb Palmar abd/add (0-45) 55 55  Thumb Opposition to Small Finger    Index MCP (0-90)    Index PIP (0-100)    Index DIP (0-70)     Long MCP (0-90)     Long PIP (0-100)     Long DIP (0-70)     Ring MCP (0-90)     Ring PIP (0-100)     Ring DIP (0-70)     Little MCP (0-90)     Little PIP (0-100)     Little DIP (0-70)     (Blank rows = not tested)    HAND FUNCTION: Grip strength: Right: 30 pain lbs; Left: 32 pain lbs, Lateral pinch: Right: 14 lbs, Left: 17 lbs, and 3 point pinch: Right: 11 pain  lbs, Left: 11pain lbs 10/18/22 FOLLOW UP  Grip strength 11/08/22  Right: 35 pain lbs; Left: 40 pain lbs, Lateral pinch: Right: 17 lbs, Left: 17 lbs, and 3 point pinch: Right: 10 pain  lbs, Left: 11pain lbs   SENSATION: Report numbness in all digits at times  EDEMA:report increase edema in R hand during day but fluctuate  COGNITION: Overall cognitive status: Within functional limits for tasks assessed     TODAY'S TREATMENT:  DATE:11/27/22 Fluido therapy done on bilateral hands prior to soft tissue massage to volar wrist and forearm -and  MC spreads and thumb webspace to hand prior to [facility] for digits and wrist flexion and extention  Great progress from [facility] in wrist flexion and ext - and thumb PA and RA Grip and prehension increase since eval   Pt cont with CMC neoprene splints on bilateral  thumbs and wrist  - wearing and support thumb CMC out of CT and wrist with functional use to decrease pain and numbness- Pt report not using them yesterday  Reinforce with pt to use them to support thumb CMC and wrist during activities  Review again with pt  positioning and modifications to task to decrease numbness and pain in bilateral hands with using her IPAD and phone - stylus Pt cont to enlarge grip on stylus -using it on IPAD patient to avoid tight prolonged grip-or pinch She is propping her tablet as well as cell phone. But need to support arm to not keep shoulder elevated causing upper trap issues  To decrease also cervical flexion. Pt to use pump bottles for soap and hair products And then ring  or  handle for cell phone - review again Pt to cont at home with homeprogram and to follow up with ortho again - pt seen for 7 visits   PATIENT EDUCATION: Education details: progress and modifications and splint wearing Person educated: Patient Education method: Explanation, Demonstration, Tactile cues, Verbal cues, and Handouts Education comprehension: verbalized understanding, returned demonstration, verbal cues required, tactile cues required, and needs further education    LONG TERM GOALS: Target date 3 wks  Patient to be independent in home program to decrease pain at rest less than a 2/10 during the day. Baseline: Pain 5/10 at rest at wrist and radial wrist into thumb.  Increases during the day with use.- NOW pain still increase with gripping , lifting and carrying 0- 3/10  Goal status: MET partially   2.  Patient reports 3 modifications adaptive equipment to decrease numbness and decrease pain in right wrist and thumb. Baseline: Pt got some adaptive equipment and some modifications  to decrease numbness and pain 0-3/10  Goal status: MET  3.  Grip increased more than 5 pounds with less pain for patient to drop objects with less Baseline: Pain at volar wrist with grip strength 30  pounds on the right left 32 pounds NOW great progress - see flowsheet  Goal status: MET  ASSESSMENT:  CLINICAL IMPRESSION: Patient present at occupational therapy evaluation with a diagnosis of bilateral carpal tunnel.  The patient had 10 years ago bilateral carpal tunnel release.  Patient reports right hand worse than left.  At eval tenderness and pain mostly over carpal tunnel as well as right radial wrist into thumb.  Tenderness and pain over posterior elbow.  Negative Tinel at cubital tunnel.  Patient report pain at eval 5/10 but increased as the day goes.  Report also numbness in all 5 fingers at times.  Patient made great progress since evaluation in grip and lateral pinch especially in the right. Pt with increase wrist AROM - pt report decreased spasms or tightness in the right hand compared to evaluation.  Pt wear CMC neoprene splints that support CMC out of CT - pt report more pain in L thumb CMC today than R hand and CT. Pt seen 7 visits - and to contact Ortho for follow up - if need shot in CT or thumb CMC depending  how symptoms are until then. Patient implemented a lot of modifications as well as adaptive equipment -and reviewed  modifications and joint protection to decrease carpal tunnel symptoms and pain in wrist and hand.    PERFORMANCE DEFICITS: in functional skills including ADLs, IADLs, edema, ROM, strength, pain, flexibility, and UE functional use, cognitive skills including energy/drive and thought, and psychosocial skills including coping strategies, environmental adaptation, habits, and routines and behaviors.   IMPAIRMENTS: are limiting patient from ADLs, IADLs, rest and sleep, play, leisure, and social participation.   COMORBIDITIES: may have co-morbidities  that affects occupational performance. Patient will benefit from skilled OT to address above impairments and improve overall function.  MODIFICATION OR ASSISTANCE TO COMPLETE EVALUATION: Min-Moderate modification of tasks  or assist with assess necessary to complete an evaluation.  OT OCCUPATIONAL PROFILE AND HISTORY: Detailed assessment: Review of records and additional review of physical, cognitive, psychosocial history related to current functional performance.  CLINICAL DECISION MAKING: Moderate - several treatment options, min-mod task modification necessary  REHAB POTENTIAL: [facility]- depression - CTR in past   EVALUATION COMPLEXITY: Moderate      PLAN:  OT FREQUENCY: every 3 wks   OT DURATION: 6 wks   PLANNED INTERVENTIONS: therapeutic exercise, manual therapy, splinting, ultrasound, paraffin, fluidotherapy, contrast bath, patient/family education, energy conservation, and DME and/or AE instructions    CONSULTED AND AGREED WITH PLAN OF CARE: Patient      Oletta Cohn, OTR/L,CLT 11/27/2022, 4:13 PM

## 2022-11-29 ENCOUNTER — Ambulatory Visit: Payer: Medicare PPO | Admitting: Physician Assistant

## 2022-11-29 ENCOUNTER — Encounter: Payer: Self-pay | Admitting: Physician Assistant

## 2022-11-29 VITALS — BP 102/67 | HR 84 | Temp 97.7°F | Ht 64.0 in | Wt 254.5 lb

## 2022-11-29 DIAGNOSIS — R1310 Dysphagia, unspecified: Secondary | ICD-10-CM | POA: Diagnosis not present

## 2022-11-29 DIAGNOSIS — K219 Gastro-esophageal reflux disease without esophagitis: Secondary | ICD-10-CM | POA: Diagnosis not present

## 2022-11-29 DIAGNOSIS — K581 Irritable bowel syndrome with constipation: Secondary | ICD-10-CM

## 2022-11-29 NOTE — Progress Notes (Signed)
Celso Amy, PA-C [address]  [address]  [place], [place] [address]  Main: [phone]  Fax: [phone]   Primary Care Physician: Tower, Audrie Gallus, MD  Primary Gastroenterologist:  Celso Amy, PA-C / Dr. Midge Minium    CC: F/U Dysphagia, GERD, and IBS-C  HPI: Mackenzie Bonilla is a 57 y.o. female returns for follow-up of dysphagia, GERD, and IBS-C.  Barium swallow with tablet showed no evidence of esophageal stricture or masses.  No dysmotility or hiatal hernia.  There was moderate acid reflux to the midesophagus (on omeprazole 20 Mg twice daily).  Her omeprazole was increased to 40 Mg twice daily.  EGD 11/01/22: 1 small gastric polyp, otherwise normal.  Screening colonoscopy colonoscopy 11/01/22: Small internal hemorrhoids, otherwise normal.  Excellent prep.  10 year repeat.  Currently her GI symptoms have improved.  She is taking MiraLAX 1 capful daily or every other day.  Having soft to loose bowel movement daily.  Stools are a little bit too loose.  GERD is controlled on omeprazole 40 Mg twice daily.  No recent dysphagia symptoms.  Current Outpatient Medications  Medication Sig Dispense Refill   acetaminophen (TYLENOL) 500 MG tablet Take 500 mg by mouth every 6 (six) hours as needed.      albuterol (VENTOLIN HFA) 108 (90 Base) MCG/ACT inhaler INHALE 2 PUFFS INTO THE LUNGS EVERY 6 HOURS AS NEEDED FOR WHEEZING. 18 g 3   ALPRAZolam (XANAX) 1 MG tablet Take 2 mg by mouth at bedtime.     Blood Glucose Monitoring Suppl (FIFTY50 GLUCOSE METER 2.0) w/Device KIT Use as directed     buPROPion (WELLBUTRIN XL) 150 MG 24 hr tablet Take 150 mg by mouth daily.     cyclobenzaprine (FLEXERIL) 5 MG tablet TAKE 1 TABLET BY MOUTH 3 TIMES DAILY AS NEEDED FOR MUSCLE SPASMS 90 tablet 3   desvenlafaxine (PRISTIQ) 100 MG 24 hr tablet Take 100 mg by mouth daily.     dimenhyDRINATE (DRAMAMINE) 50 MG tablet Take 50 mg by mouth every 8 (eight) hours as needed.     docusate sodium (COLACE) 100  MG capsule Take 1 capsule (100 mg total) by mouth 2 (two) times daily. 180 capsule 3   fluticasone (FLONASE) 50 MCG/ACT nasal spray Place into both nostrils daily.     gabapentin (NEURONTIN) 100 MG capsule Take 100 mg by mouth in the morning.     gabapentin (NEURONTIN) 300 MG capsule Take 1 capsule (300 mg total) by mouth at bedtime. 90 capsule 1   ketoconazole (NIZORAL) 2 % cream Apply 1 Application topically daily as needed for irritation. 30 g 0   losartan (COZAAR) 25 MG tablet      minoxidil (LONITEN) 2.5 MG tablet TAKE 1 TABLET BY MOUTH DAILY 90 tablet 0   MOUNJARO 12.5 MG/0.5ML Pen Inject 12.5 mg into the skin once a week.     omeprazole (PRILOSEC) 40 MG capsule Take 1 capsule (40 mg total) by mouth in the morning and at bedtime. 60 capsule 3   oxybutynin (DITROPAN XL) 15 MG 24 hr tablet TAKE 1 TABLET BY MOUTH DAILY 90 tablet 1   Polyethyl Glycol-Propyl Glycol (SYSTANE OP) Apply 1 drop to eye daily as needed.     pravastatin (PRAVACHOL) 20 MG tablet 1 TABLET BY MOUTH AT BEDTIME. 90 tablet 0   Simethicone (GAS-X PO) Take by mouth as needed.     spironolactone (ALDACTONE) 100 MG tablet TAKE 1 TABLET BY MOUTH DAILY 90 tablet 1  traMADol (ULTRAM) 50 MG tablet TAKE ONE TABLET TWICE A DAY IF NEEDED FOR SEVERE PAIN 30 tablet 0   traZODone (DESYREL) 150 MG tablet Take 300 mg by mouth at bedtime.     TRESIBA FLEXTOUCH 100 UNIT/ML FlexTouch Pen Inject 46 Units into the skin daily.     XEOMIN 50 units SOLR injection Inject 50 Units into the muscle every 3 (three) months.     zinc oxide (BALMEX) 11.3 % CREA cream Apply 1 application topically 2 (two) times daily.     Bacillus Coagulans-Inulin (PROBIOTIC-PREBIOTIC) 1-250 BILLION-MG CAPS Take 1 Capful by mouth daily. (Patient not taking: Reported on 11/29/2022)     No current facility-administered medications for this visit.    Allergies as of 11/29/2022 - Review Complete 11/29/2022  Allergen Reaction Noted   Cephalexin  10/16/2006   Codeine   10/16/2006   Duloxetine  10/16/2006   Levaquin [levofloxacin] Nausea Only 12/25/2011   Lisinopril Cough 01/16/2017   Metformin and related Diarrhea 11/14/2011   Quetiapine Other (See Comments) 10/16/2006   Sertraline hcl     Triazolam  10/16/2006   Zaleplon  10/16/2006   Zocor [simvastatin - high dose]  07/05/2010   Zolpidem tartrate  10/16/2006   Zyrtec [cetirizine]  11/15/2021   Hydrocodone Itching and Rash 10/16/2006   Norelgestromin-eth estradiol  10/16/2006    Past Medical History:  Diagnosis Date   Allergy    allergic rhinitis   Anemia    Anxiety    Arthritis    osteoarthritis   Asthma    Claustrophobia    does not like oxygen mask covering face   Depression    Diabetes mellitus without complication (HCC)    Fall 2016   Fatty liver 07/2008   abd. ultrasound - fatty liver ; slt dilated cbd (no stones ) 06/10// abd.  ultrasound normal on 04/2006   Fibromyalgia    GERD (gastroesophageal reflux disease)    EGD negative 06/2001// EGD erythematous mucosa, polyp 08/2008   High uric acid in 24 hour urine specimen    Hyperhidrosis    Hyperlipidemia    Kidney stones    Lymphedema    Obesity    PCOS (polycystic ovarian syndrome)    Shortness of breath dyspnea    Tachycardia    TMJ (dislocation of temporomandibular joint)    UTI (lower urinary tract infection)     Past Surgical History:  Procedure Laterality Date   BIOPSY  11/01/2022   Procedure: BIOPSY;  Surgeon: Midge Minium, MD;  Location: [facility] ENDOSCOPY;  Service: Endoscopy;;   BREAST BIOPSY Right 2016   neg   BREAST LUMPECTOMY WITH RADIOACTIVE SEED LOCALIZATION Right 08/02/2014   Procedure: BREAST LUMPECTOMY WITH RADIOACTIVE SEED LOCALIZATION;  Surgeon: Emelia Loron, MD;  Location: [facility] OR;  Service: General;  Laterality: Right;   COLONOSCOPY  08/12/2012   Completed by Dr. Cecelia Byars, normal exam.    COLONOSCOPY WITH PROPOFOL N/A 11/01/2022   Procedure: COLONOSCOPY WITH PROPOFOL;  Surgeon: Midge Minium, MD;   Location: [facility] ENDOSCOPY;  Service: Endoscopy;  Laterality: N/A;   ENDOMETRIAL BIOPSY  01/2004   ESOPHAGOGASTRODUODENOSCOPY     ESOPHAGOGASTRODUODENOSCOPY (EGD) WITH PROPOFOL N/A 08/18/2019   Procedure: ESOPHAGOGASTRODUODENOSCOPY (EGD) WITH PROPOFOL;  Surgeon: Midge Minium, MD;  Location: [facility] ENDOSCOPY;  Service: Endoscopy;  Laterality: N/A;   ESOPHAGOGASTRODUODENOSCOPY (EGD) WITH PROPOFOL N/A 11/01/2022   Procedure: ESOPHAGOGASTRODUODENOSCOPY (EGD) WITH PROPOFOL;  Surgeon: Midge Minium, MD;  Location: [facility] ENDOSCOPY;  Service: Endoscopy;  Laterality: N/A;   FOOT SURGERY  SEPTOPLASTY     two times   TONSILLECTOMY     UTERINE FIBROID SURGERY  06/2005   ablation   uterine tumor  09/2001   VAGUS NERVE STIMULATOR INSERTION      Review of Systems:    All systems reviewed and negative except where noted in HPI.   Physical Examination:   BP 102/67 (BP Location: Left Arm, Patient Position: Sitting, Cuff Size: Large)   Pulse 84   Temp 97.7 F (36.5 C) (Oral)   Ht 5\' 4"  (1.626 m)   Wt 254 lb 8 oz (115.4 kg)   BMI 43.68 kg/m   General: Well-nourished, well-developed in no acute distress.  Lungs: Clear to auscultation bilaterally. Non-labored. Heart: Regular rate and rhythm, no murmurs rubs or gallops.  Abdomen: Bowel sounds are normal; Abdomen is Soft; No hepatosplenomegaly, masses or hernias;  No Abdominal Tenderness; No guarding or rebound tenderness. Neuro: Alert and oriented x 3.  Grossly intact.  Psych: Alert and cooperative, normal mood and affect.   Imaging Studies: No results found.  Assessment and Plan:   JAYNA MULNIX is a 57 y.o. y/o female returns for follow-up of:  Irritable bowel syndrome, constipation predominant Continue MiraLAX 1 capful in a drink once daily Add Benefiber powder mix 1 or 2 tablespoons in a drink once daily  2.  GERD  Continue omeprazole 40 mg twice daily  Add OTC Pepcid 20 mg once or twice daily as needed  Recommend Lifestyle  Modifications to prevent Acid Reflux.  Rec. Avoid coffee, sodas, peppermint, citrus fruits, and spicey foods.  Avoid eating 2-3 hours before bedtime.   We discussed adverse side effects of PPIs to include vitamin deficiencies, osteoporosis, renal insufficiency, dementia and increased risk of C. Difficile.  Recommend take lowest effective dose of PPI necessary to control acid reflux.  OK to add H2RB (Pepcid 20mg  daily) or antiacid if needed for breakthrough acid reflux.   3.  Dysphagia -most likely due to acid reflux  Gave patient reassurance regarding normal barium swallow and EGD results.    4.  Colon cancer screening  Colonoscopy 10/2022 was normal.  Repeat in 10 years.  Celso Amy, PA-C  Follow up in 1 year to refill medication or as needed based on your symptoms.

## 2022-11-30 ENCOUNTER — Telehealth: Payer: Self-pay | Admitting: Family Medicine

## 2022-11-30 NOTE — Addendum Note (Signed)
Addended by: Shon Millet on: 11/30/2022 11:44 AM   Modules accepted: Orders

## 2022-11-30 NOTE — Telephone Encounter (Signed)
Patient called in and stated that she has another lesion on her leg from losing weight and the skin is hanging over. She stated that she has been putting antibiotic ointment on it. She just wanted to make Dr. Milinda Antis aware.

## 2022-11-30 NOTE — Telephone Encounter (Signed)
Pt notified of Dr. Tower's comments and verbalized understanding  

## 2022-11-30 NOTE — Telephone Encounter (Signed)
Keep clean with soap and water and protect from friction any way you can  Use the antibiotic ointment as you are  Watch for signs and symptoms of infection  Follow up if not improving   Thanks for the update

## 2022-12-04 ENCOUNTER — Telehealth: Payer: Self-pay | Admitting: Podiatry

## 2022-12-04 ENCOUNTER — Other Ambulatory Visit: Payer: Self-pay | Admitting: *Deleted

## 2022-12-04 ENCOUNTER — Other Ambulatory Visit: Payer: Self-pay | Admitting: Podiatry

## 2022-12-04 ENCOUNTER — Other Ambulatory Visit: Payer: Self-pay | Admitting: Family Medicine

## 2022-12-04 DIAGNOSIS — R8271 Bacteriuria: Secondary | ICD-10-CM

## 2022-12-04 MED ORDER — GABAPENTIN 300 MG PO CAPS: 300.0000 mg | ORAL_CAPSULE | Freq: Every day | ORAL | 1 refills | Status: DC

## 2022-12-04 NOTE — Telephone Encounter (Signed)
Already sent. -Dr. Logan Bores

## 2022-12-04 NOTE — Telephone Encounter (Signed)
Called pt let her know RX sent over

## 2022-12-04 NOTE — Telephone Encounter (Signed)
Patient called asking for refill of her Gabapentin medication to [facility] pharmacy 3 months worth

## 2022-12-04 NOTE — Telephone Encounter (Signed)
Patient needs a refill on gabapentin

## 2022-12-04 NOTE — Telephone Encounter (Signed)
Urology filled med last year, ? If PCP wants to take over med. Please advise

## 2022-12-04 NOTE — Telephone Encounter (Signed)
Refill sent.  Thanks, Dr. Amalia Hailey

## 2022-12-10 ENCOUNTER — Telehealth: Payer: Self-pay | Admitting: Podiatry

## 2022-12-10 ENCOUNTER — Other Ambulatory Visit: Payer: Self-pay | Admitting: Podiatry

## 2022-12-10 MED ORDER — GABAPENTIN 300 MG PO CAPS: 300.0000 mg | ORAL_CAPSULE | Freq: Every day | ORAL | 1 refills | Status: DC

## 2022-12-10 NOTE — Telephone Encounter (Signed)
Refill sent. - Dr. Griffin Dewilde

## 2022-12-10 NOTE — Telephone Encounter (Signed)
Pt called and is needing the gabapentin 100mg  only with a 3 month supply. She takes the 100mg  in the morning and is taking 300mg  at night currently. You sent in the 300mg  and she does have refills on this on file. The pharmacy is correct in the chart.

## 2022-12-11 ENCOUNTER — Telehealth: Payer: Self-pay | Admitting: Podiatry

## 2022-12-11 ENCOUNTER — Other Ambulatory Visit: Payer: Self-pay | Admitting: Podiatry

## 2022-12-11 MED ORDER — GABAPENTIN 100 MG PO CAPS: 100.0000 mg | ORAL_CAPSULE | Freq: Three times a day (TID) | ORAL | 3 refills | Status: DC

## 2022-12-11 NOTE — Telephone Encounter (Signed)
Mackenzie Bonilla [facility] called and the rx that was sent over for the 100mg  gabapentin needs to be changed to 1 daily. Please change and let me know and I can call the pharmacy. The pharmacy number is [phone]. Okey Regal is there until 4pm today

## 2022-12-11 NOTE — Telephone Encounter (Signed)
Corrected. Thanks, Dr. Logan Bores

## 2022-12-11 NOTE — Telephone Encounter (Signed)
Called home and the phone keeps sayinc call cannot be complete at this time. Called cell and left message medication was sent in.

## 2022-12-12 ENCOUNTER — Telehealth: Payer: Self-pay | Admitting: Physician Assistant

## 2022-12-12 ENCOUNTER — Other Ambulatory Visit: Payer: Self-pay

## 2022-12-12 ENCOUNTER — Telehealth: Payer: Self-pay

## 2022-12-12 DIAGNOSIS — E119 Type 2 diabetes mellitus without complications: Secondary | ICD-10-CM | POA: Diagnosis not present

## 2022-12-12 MED ORDER — GABAPENTIN 100 MG PO CAPS: 100.0000 mg | ORAL_CAPSULE | Freq: Every day | ORAL | 3 refills | Status: DC

## 2022-12-12 MED ORDER — PANTOPRAZOLE SODIUM 40 MG PO TBEC
40.0000 mg | DELAYED_RELEASE_TABLET | Freq: Two times a day (BID) | ORAL | 5 refills | Status: DC
Start: 2022-12-12 — End: 2023-06-18

## 2022-12-12 NOTE — Telephone Encounter (Signed)
Currently taking Omeprazole  would like to switch to Protonix . Please advise.

## 2022-12-12 NOTE — Telephone Encounter (Signed)
Pantoprazole 40 mg I tablet twice daily # 60 5 RF.sent to pharmacy.

## 2022-12-12 NOTE — Telephone Encounter (Signed)
Carol from [facility] called again and they are needing the updated gabapentin order for the 100mg  once daily. They are trying to get pts medication sent to her tomorrow so if we could get this updated today that would be good.

## 2022-12-12 NOTE — Telephone Encounter (Signed)
Pt requesting call back to discuss medication change  prilosec to protonix

## 2022-12-12 NOTE — Telephone Encounter (Signed)
Notified amy rx was corrected and they confirmed they had received it

## 2022-12-18 ENCOUNTER — Ambulatory Visit: Payer: Medicare PPO | Admitting: Dermatology

## 2022-12-18 DIAGNOSIS — L649 Androgenic alopecia, unspecified: Secondary | ICD-10-CM | POA: Diagnosis not present

## 2022-12-18 DIAGNOSIS — Z79899 Other long term (current) drug therapy: Secondary | ICD-10-CM

## 2022-12-18 DIAGNOSIS — L853 Xerosis cutis: Secondary | ICD-10-CM | POA: Diagnosis not present

## 2022-12-18 DIAGNOSIS — L821 Other seborrheic keratosis: Secondary | ICD-10-CM

## 2022-12-18 MED ORDER — MINOXIDIL 2.5 MG PO TABS: 2.5000 mg | ORAL_TABLET | Freq: Every day | ORAL | 0 refills | Status: DC

## 2022-12-18 MED ORDER — AMMONIUM LACTATE 12 % EX CREA: 1.0000 | TOPICAL_CREAM | CUTANEOUS | 5 refills | Status: AC | PRN

## 2022-12-18 NOTE — Patient Instructions (Addendum)
Recommend starting moisturizer with exfoliant (Urea, Salicylic acid, or Lactic acid) one to two times daily to help smooth rough and bumpy skin.  OTC options include Cetaphil Rough and Bumpy lotion (Urea), Eucerin Roughness Relief lotion or spot treatment cream (Urea), CeraVe SA lotion/cream for Rough and Bumpy skin (Sal Acid), Gold Bond Rough and Bumpy cream (Sal Acid), and AmLactin 12% lotion/cream (Lactic Acid).  If applying in morning, also apply sunscreen to sun-exposed areas, since these exfoliating moisturizers can increase sensitivity to sun.   Doses of minoxidil for hair loss are considered 'low dose'. This is because the doses used for hair loss are much lower than the doses which are used for conditions such as high blood pressure (hypertension). The doses used for hypertension are 10-40mg  per day.  Side effects are uncommon at the low doses (up to 2.5 mg/day) used to treat hair loss. Potential side effects, more commonly seen at higher doses, include: Increase in hair growth (hypertrichosis) elsewhere on face and body Temporary hair shedding upon starting medication which may last up to 4 weeks Ankle swelling, fluid retention, rapid weight gain more than 5 pounds Low blood pressure and feeling lightheaded or dizzy when standing up quickly Fast or irregular heartbeat Headaches  Spironolactone can cause increased urination and cause blood pressure to decrease. Please watch for signs of lightheadedness and be cautious when changing position. It can sometimes cause breast tenderness or an irregular period in premenopausal women. It can also increase potassium. The increase in potassium usually is not a concern unless you are taking other medicines that also increase potassium, so please be sure your doctor knows all of the other medications you are taking. This medication should not be taken by pregnant women.  This medicine should also not be taken together with sulfa drugs like Bactrim  (trimethoprim/sulfamethexazole).    Due to recent changes in healthcare laws, you may see results of your pathology and/or laboratory studies on MyChart before the doctors have had a chance to review them. We understand that in some cases there may be results that are confusing or concerning to you. Please understand that not all results are received at the same time and often the doctors may need to interpret multiple results in order to provide you with the best plan of care or course of treatment. Therefore, we ask that you please give [place] 2 business days to thoroughly review all your results before contacting the office for clarification. Should we see a critical lab result, you will be contacted sooner.   If You Need Anything After Your Visit  If you have any questions or concerns for your doctor, please call our main line at [phone] and press option 4 to reach your doctor's medical assistant. If no one answers, please leave a voicemail as directed and we will return your call as soon as possible. Messages left after 4 pm will be answered the following business day.   You may also send [place] a message via MyChart. We typically respond to MyChart messages within 1-2 business days.  For prescription refills, please ask your pharmacy to contact our office. Our fax number is [phone].  If you have an urgent issue when the clinic is closed that cannot wait until the next business day, you can page your doctor at the number below.    Please note that while we do our best to be available for urgent issues outside of office hours, we are not available 24/7.   If you  have an urgent issue and are unable to reach [place], you may choose to seek medical care at your doctor's office, retail clinic, urgent care center, or emergency room.  If you have a medical emergency, please immediately call 911 or go to the emergency department.  Pager Numbers  - Dr. Gwen Pounds: [phone]  - Dr. Roseanne Reno:  [phone]  - Dr. Katrinka Blazing: [phone]   In the event of inclement weather, please call our main line at [phone] for an update on the status of any delays or closures.  Dermatology Medication Tips: Please keep the boxes that topical medications come in in order to help keep track of the instructions about where and how to use these. Pharmacies typically print the medication instructions only on the boxes and not directly on the medication tubes.   If your medication is too expensive, please contact our office at [phone] option 4 or send [place] a message through MyChart.   We are unable to tell what your co-pay for medications will be in advance as this is different depending on your insurance coverage. However, we may be able to find a substitute medication at lower cost or fill out paperwork to get insurance to cover a needed medication.   If a prior authorization is required to get your medication covered by your insurance company, please allow [place] 1-2 business days to complete this process.  Drug prices often vary depending on where the prescription is filled and some pharmacies may offer cheaper prices.  The website www.goodrx.com contains coupons for medications through different pharmacies. The prices here do not account for what the cost may be with help from insurance (it may be cheaper with your insurance), but the website can give you the price if you did not use any insurance.  - You can print the associated coupon and take it with your prescription to the pharmacy.  - You may also stop by our office during regular business hours and pick up a GoodRx coupon card.  - If you need your prescription sent electronically to a different pharmacy, notify our office through [facility] or by phone at [phone] option 4.     Si Usted Necesita Algo Despus de Su Visita  Tambin puede enviarnos un mensaje a travs de Clinical cytogeneticist. Por lo general respondemos a los mensajes de  MyChart en el transcurso de 1 a 2 das hbiles.  Para renovar recetas, por favor pida a su farmacia que se ponga en contacto con nuestra oficina. Annie Sable de fax es [place] [phone].  Si tiene un asunto urgente cuando la clnica est cerrada y que no puede esperar hasta el siguiente da hbil, puede llamar/localizar a su doctor(a) al nmero que aparece a continuacin.   Por favor, tenga en cuenta que aunque hacemos todo lo posible para estar disponibles para asuntos urgentes fuera del horario de [place], no estamos disponibles las 24 horas del da, los 7 [address]  [address] de la [place].   Si tiene un problema urgente y no puede comunicarse con nosotros, puede optar por buscar atencin mdica  en el consultorio de su doctor(a), en una clnica privada, en un centro de atencin urgente o en una sala de emergencias.  Si tiene Engineer, drilling, por favor llame inmediatamente al 911 o vaya a la sala de emergencias.  Nmeros de bper  - Dr. Gwen Pounds: [phone]  - Dra. Roseanne Reno: [phone]  - Dr. Katrinka Blazing: [phone]   En caso de inclemencias del tiempo, por favor llame a nuestra lnea principal  al [phone] para una actualizacin sobre el [place] de cualquier retraso o cierre.  Consejos para la medicacin en dermatologa: Por favor, guarde las cajas en las que vienen los medicamentos de uso tpico para ayudarle a seguir las instrucciones sobre dnde y cmo usarlos. Las farmacias generalmente imprimen las instrucciones del medicamento slo en las cajas y no directamente en los tubos del [place].   Si su medicamento es muy caro, por favor, pngase en contacto con Rolm [place] llamando al [phone] y presione la opcin 4 o envenos un mensaje a travs de Clinical cytogeneticist.   No podemos decirle cul ser su copago por los medicamentos por adelantado ya que esto es diferente dependiendo de la cobertura de su seguro. Sin embargo, es posible que podamos encontrar un medicamento sustituto a Advice worker un formulario para que el seguro cubra el medicamento que se considera necesario.   Si se requiere una autorizacin previa para que su compaa de seguros [place] su medicamento, por favor permtanos de 1 a 2 das hbiles para completar [address]  Los precios de los medicamentos varan con frecuencia dependiendo del Environmental consultant de dnde se surte la receta y alguna farmacias pueden ofrecer precios ms baratos.  El sitio web www.goodrx.com tiene cupones para medicamentos de Health and safety inspector. Los precios aqu no tienen en cuenta lo que podra costar con la ayuda del seguro (puede ser ms barato con su seguro), pero el sitio web puede darle el precio si no utiliz Tourist information centre manager.  - Puede imprimir el cupn correspondiente y llevarlo con su receta a la farmacia.  - Tambin puede pasar por nuestra oficina durante el horario de atencin regular y Education officer, museum una tarjeta de cupones de GoodRx.  - Si necesita que su receta se enve electrnicamente a una farmacia diferente, informe a nuestra oficina a travs de MyChart de [place] o por telfono llamando al [phone] y presione la opcin 4.

## 2022-12-18 NOTE — Progress Notes (Signed)
Follow-Up Visit   Subjective  Mackenzie Bonilla is a 57 y.o. female who presents for the following: Androgenetic Alopecia. Patient feels like she has started losing more hair recently. She is taking Minoxidil 2.5 MG 1 tablet daily, and Spironolactone 100 MG daily prescribed by PCP. Patient is still losing hair, but has improved since starting meds.  She also has itchy bumps that she gets under braces that she is wearing on wrists for Carpel Tunnel.     The following portions of the chart were reviewed this encounter and updated as appropriate: medications, allergies, medical history  Review of Systems:  No other skin or systemic complaints except as noted in HPI or Assessment and Plan.  Objective  Well appearing patient in no apparent distress; mood and affect are within normal limits.  A focused examination was performed of the following areas: Scalp, hands  Relevant physical exam findings are noted in the Assessment and Plan.    Assessment & Plan   Androgenetic alopecia  Related Medications minoxidil (LONITEN) 2.5 MG tablet Take 1 tablet (2.5 mg total) by mouth daily.  ANDROGENETIC ALOPECIA (FEMALE PATTERN HAIR LOSS) Exam: Diffuse thinning of the crown and narrowing of the midline part with retention of the frontal hairline. Improving compared to photo 02/13/2022.   Chronic and persistent condition with duration or expected duration over one year. Condition is improving with treatment but not currently at goal.   Female Androgenic Alopecia is a chronic condition related to genetics and/or hormonal changes.  In women androgenetic alopecia is commonly associated with menopause but may occur any time after puberty.  It causes hair thinning primarily on the crown with widening of the part and temporal hairline recession.  Can use OTC Rogaine (minoxidil) 5% solution/foam as directed.  Oral treatments in female patients who have no contraindication may include : - Low dose oral  minoxidil 1.25 - 5mg  daily - Spironolactone 50 - 100mg  bid - Finasteride 2.5 - 5 mg daily Adjunctive therapies include: - Low Level Laser Light Therapy (LLLT) - Platelet-rich plasma injections (PRP) - Hair Transplants or scalp reduction   Treatment Plan: BP 121/71 Continue Minoxidil 2.5 MG 1 tablet daily. Continue Spironolactone 100 MG 1 tablet daily as prescribed by PCP.   Doses of minoxidil for hair loss are considered 'low dose'. This is because the doses used for hair loss are much lower than the doses which are used for conditions such as high blood pressure (hypertension). The doses used for hypertension are 10-40mg  per day.  Side effects are uncommon at the low doses (up to 2.5 mg/day) used to treat hair loss. Potential side effects, more commonly seen at higher doses, include: Increase in hair growth (hypertrichosis) elsewhere on face and body Temporary hair shedding upon starting medication which may last up to 4 weeks Ankle swelling, fluid retention, rapid weight gain more than 5 pounds Low blood pressure and feeling lightheaded or dizzy when standing up quickly Fast or irregular heartbeat Headaches  Long term medication management.  Patient is using long term (months to years) prescription medication  to control their dermatologic condition.  These medications require periodic monitoring to evaluate for efficacy and side effects and may require periodic laboratory monitoring.    Xerosis - diffuse xerotic patches - recommend gentle, hydrating skin care - gentle skin care handout given Recommend starting moisturizer with exfoliant (Urea, Salicylic acid, or Lactic acid) one to two times daily to help smooth rough and bumpy skin.  OTC options include Cetaphil  Rough and Bumpy lotion (Urea), Eucerin Roughness Relief lotion or spot treatment cream (Urea), CeraVe SA lotion/cream for Rough and Bumpy skin (Sal Acid), Gold Bond Rough and Bumpy cream (Sal Acid), and AmLactin 12%  lotion/cream (Lactic Acid).  If applying in morning, also apply sunscreen to sun-exposed areas, since these exfoliating moisturizers can increase sensitivity to sun. Samples of AmLactin given today.   SEBORRHEIC KERATOSIS - Stuck-on, waxy, flesh-white papules arms - Benign-appearing - Discussed benign etiology and prognosis. - Observe - Call for any changes -Recommend starting moisturizer with exfoliant (Urea, Salicylic acid, or Lactic acid) one to two times daily to help smooth rough and bumpy skin.  OTC options include Cetaphil Rough and Bumpy lotion (Urea), Eucerin Roughness Relief lotion or spot treatment cream (Urea), CeraVe SA lotion/cream for Rough and Bumpy skin (Sal Acid), Gold Bond Rough and Bumpy cream (Sal Acid), and AmLactin 12% lotion/cream (Lactic Acid).  If applying in morning, also apply sunscreen to sun-exposed areas, since these exfoliating moisturizers can increase sensitivity to sun.      Return in about 6 months (around 06/17/2023) for Alopecia.  ICherlyn Labella, CMA, am acting as scribe for Willeen Niece, MD .   Documentation: I have reviewed the above documentation for accuracy and completeness, and I agree with the above.  Willeen Niece, MD

## 2022-12-25 DIAGNOSIS — G5603 Carpal tunnel syndrome, bilateral upper limbs: Secondary | ICD-10-CM | POA: Diagnosis not present

## 2022-12-27 DIAGNOSIS — G5601 Carpal tunnel syndrome, right upper limb: Secondary | ICD-10-CM | POA: Diagnosis not present

## 2022-12-28 ENCOUNTER — Ambulatory Visit: Payer: Medicare PPO | Admitting: Family Medicine

## 2022-12-28 ENCOUNTER — Encounter: Payer: Self-pay | Admitting: Family Medicine

## 2022-12-28 VITALS — BP 126/68 | HR 80 | Temp 98.2°F | Ht 64.0 in | Wt 255.5 lb

## 2022-12-28 DIAGNOSIS — K219 Gastro-esophageal reflux disease without esophagitis: Secondary | ICD-10-CM | POA: Diagnosis not present

## 2022-12-28 DIAGNOSIS — B372 Candidiasis of skin and nail: Secondary | ICD-10-CM | POA: Diagnosis not present

## 2022-12-28 DIAGNOSIS — L570 Actinic keratosis: Secondary | ICD-10-CM | POA: Diagnosis not present

## 2022-12-28 DIAGNOSIS — N951 Menopausal and female climacteric states: Secondary | ICD-10-CM | POA: Diagnosis not present

## 2022-12-28 DIAGNOSIS — T380X5A Adverse effect of glucocorticoids and synthetic analogues, initial encounter: Secondary | ICD-10-CM | POA: Diagnosis not present

## 2022-12-28 NOTE — Assessment & Plan Note (Signed)
Pt had 2 steroid shots (each wrist) in one week Now anxious/ hot and acting manic in office Irritability/trouble sleeping and increased hunger  Explained this will improve over the coming week  Pt voiced understanding and was thankful   Update if not starting to improve in a week or if worsening  Call back and Er precautions noted in detail today

## 2022-12-28 NOTE — Assessment & Plan Note (Signed)
Recently changed from omeprazole ot protonix 40 mg bid Noted  No clinical changes

## 2022-12-28 NOTE — Assessment & Plan Note (Signed)
Some small seb keratoses on legs / flesh colored today  Not insect bites Reassurred pt

## 2022-12-28 NOTE — Assessment & Plan Note (Signed)
Worse lately after 2 ortho steroid shots   Discussed non hormonal treatments  Is already on gabapentin 100 am 300 pm for neuropathy  Will try increase to 300 mg bid and let us know how it goes If helpful TID dosing is also option   No doubt recent steroid shots worsened this

## 2022-12-28 NOTE — Patient Instructions (Addendum)
Try going up on gabapentin to 300 mg in am and 300 mg in pm  This may help your hot flashes Let me know how you do  In the future taking it three times daily   Your legs look pretty good  Keep doing what you are doing   I think the steroid shots are making your heat intolerance worse (can also cause anxiety or manic feeling and have trouble sleeping)  As this gets out of your system you should start to feel like your normal self   Keep taking pravastatin   Take care of yourself

## 2022-12-28 NOTE — Progress Notes (Signed)
Subjective:    Patient ID: Mackenzie Bonilla, female    DOB: 01/22/1966, 57 y.o.   MRN: [id number]  HPI  Wt Readings from Last 3 Encounters:  12/28/22 255 lb 8 oz (115.9 kg)  11/29/22 254 lb 8 oz (115.4 kg)  11/07/22 255 lb (115.7 kg)   43.86 kg/m  Vitals:   12/28/22 1157  BP: 126/68  Pulse: 80  Temp: 98.2 F (36.8 C)  SpO2: 98%   Pt presents to discuss several health concerns  Had steroid shots - carpal tunnel / celestone /both wrists    Lesions both legs /lump left thigh feels hot  ? Bites on lower right leg  Temperature intolerance  Sleep issues ? About ppi (prilosec changed to protonix)   Lesions on legs    Hot and cold  Going on for months  Worse  Hot -more sweating under arms and on back   Gabapentin 100 mg in am and 300 mg at night   Psychiatry- took her off wellbutrin  She was too irritable    Had orthopedic shot-told to hold her pravastatin  Lab Results  Component Value Date   TSH 0.87 07/18/2022   Lab Results  Component Value Date   ALT 9 07/18/2022   AST 12 07/18/2022   ALKPHOS 73 07/18/2022   BILITOT 0.4 07/18/2022      Patient Active Problem List   Diagnosis Date Noted   Steroid side effects 12/28/2022   Dysphagia 11/01/2022   Gastric polyps 11/01/2022   Nontraumatic tear of skin 09/05/2022   Adverse effect of proton pump inhibitor 07/18/2022   Colon cancer screening 07/18/2022   Nasal congestion 04/30/2022   Breast asymmetry 02/21/2022   Muscle twitch 02/21/2022   Abdominal pannus 02/21/2022   Recurrent UTI 12/21/2021   Paresthesia 11/15/2021   Ingrown nail of great toe of left foot 11/15/2021   Keratosis 08/31/2021   Hair loss 06/19/2021   Breast pain 12/20/2020   Encounter for screening mammogram for breast cancer 12/13/2020   Lumbar facet arthropathy 12/29/2019   Chronic bilateral thoracic back pain 12/08/2019   Lumbar degenerative disc disease 12/08/2019   Fibromyalgia 12/08/2019   Chronic pain syndrome  12/08/2019   Lumbar radicular pain (left S1) 12/08/2019   Mid back pain 02/11/2019   Multiple joint pain 11/12/2018   Gallstones 04/19/2017   Diabetic polyneuropathy associated with type 2 diabetes mellitus (HCC) 04/07/2017   Diabetic angiopathy (HCC) 04/07/2017   Vasomotor symptoms due to menopause 04/07/2017   Mobility impaired 11/12/2016   Urinary incontinence 12/02/2015   Candidal intertrigo 04/06/2015   Cystocele 04/06/2015   Constipation 04/06/2015   Myofascial pain syndrome of lumbar spine 12/28/2014   Hypertriglyceridemia 10/14/2014   Abdominal pain 04/23/2012   Uric acid kidney stone 11/20/2011   HYPERHIDROSIS 11/07/2009   Menopausal symptoms 06/17/2009   Type 2 diabetes mellitus without complication, without long-term current use of insulin (HCC) 08/23/2008   Vitamin D deficiency 05/07/2008   Vitamin B 12 deficiency 06/04/2007   CHRONIC FATIGUE SYNDROME 11/15/2006   DIVERTICULITIS, HX OF 11/15/2006   PCOS (polycystic ovarian syndrome) 10/16/2006   Hyperlipidemia associated with type 2 diabetes mellitus (HCC) 10/16/2006   Morbid obesity (HCC) 10/16/2006   Generalized anxiety disorder 10/16/2006   PANIC DISORDER 10/16/2006   BULIMIA 10/16/2006   Chronic depression 10/16/2006   CARPAL TUNNEL SYNDROME 10/16/2006   Lymphedema 10/16/2006   Allergic rhinitis 10/16/2006   Mild intermittent asthma 10/16/2006   Temporomandibular joint disorder 10/16/2006   GERD 10/16/2006  Irritable bowel syndrome 10/16/2006   Osteoarthritis 10/16/2006   COUGH, CHRONIC 10/16/2006   Past Medical History:  Diagnosis Date   Allergy    allergic rhinitis   Anemia    Anxiety    Arthritis    osteoarthritis   Asthma    Claustrophobia    does not like oxygen mask covering face   Depression    Diabetes mellitus without complication (HCC)    Fall 2016   Fatty liver 07/2008   abd. ultrasound - fatty liver ; slt dilated cbd (no stones ) 06/10// abd.  ultrasound normal on 04/2006    Fibromyalgia    GERD (gastroesophageal reflux disease)    EGD negative 06/2001// EGD erythematous mucosa, polyp 08/2008   High uric acid in 24 hour urine specimen    Hyperhidrosis    Hyperlipidemia    Kidney stones    Lymphedema    Obesity    PCOS (polycystic ovarian syndrome)    Shortness of breath dyspnea    Tachycardia    TMJ (dislocation of temporomandibular joint)    UTI (lower urinary tract infection)    Past Surgical History:  Procedure Laterality Date   BIOPSY  11/01/2022   Procedure: BIOPSY;  Surgeon: Midge Minium, MD;  Location: [facility] ENDOSCOPY;  Service: Endoscopy;;   BREAST BIOPSY Right 2016   neg   BREAST LUMPECTOMY WITH RADIOACTIVE SEED LOCALIZATION Right 08/02/2014   Procedure: BREAST LUMPECTOMY WITH RADIOACTIVE SEED LOCALIZATION;  Surgeon: Emelia Loron, MD;  Location: [facility] OR;  Service: General;  Laterality: Right;   COLONOSCOPY  08/12/2012   Completed by Dr. Cecelia Byars, normal exam.    COLONOSCOPY WITH PROPOFOL N/A 11/01/2022   Procedure: COLONOSCOPY WITH PROPOFOL;  Surgeon: Midge Minium, MD;  Location: [facility] ENDOSCOPY;  Service: Endoscopy;  Laterality: N/A;   ENDOMETRIAL BIOPSY  01/2004   ESOPHAGOGASTRODUODENOSCOPY     ESOPHAGOGASTRODUODENOSCOPY (EGD) WITH PROPOFOL N/A 08/18/2019   Procedure: ESOPHAGOGASTRODUODENOSCOPY (EGD) WITH PROPOFOL;  Surgeon: Midge Minium, MD;  Location: [facility] ENDOSCOPY;  Service: Endoscopy;  Laterality: N/A;   ESOPHAGOGASTRODUODENOSCOPY (EGD) WITH PROPOFOL N/A 11/01/2022   Procedure: ESOPHAGOGASTRODUODENOSCOPY (EGD) WITH PROPOFOL;  Surgeon: Midge Minium, MD;  Location: [facility] ENDOSCOPY;  Service: Endoscopy;  Laterality: N/A;   FOOT SURGERY     SEPTOPLASTY     two times   TONSILLECTOMY     UTERINE FIBROID SURGERY  06/2005   ablation   uterine tumor  09/2001   VAGUS NERVE STIMULATOR INSERTION     Social History   Tobacco Use   Smoking status: Never   Smokeless tobacco: Never  Vaping Use   Vaping status: Never Used  Substance Use Topics    Alcohol use: No    Alcohol/week: 0.0 standard drinks of alcohol   Drug use: No   Family History  Problem Relation Age of Onset   Hypertension Father    Cancer Father        pancreatic cancer   Hypertension Sister    Heart disease Maternal Grandmother 9       MI   Diabetes Maternal Grandmother    Heart disease Paternal Grandmother 19       MI   Diabetes Paternal Grandmother    Breast cancer Neg Hx    Allergies  Allergen Reactions   Cephalexin    Codeine     rash   Duloxetine    Levaquin [Levofloxacin] Nausea Only   Lisinopril Cough   Metformin And Related Diarrhea    GI side effects    Quetiapine  Other (See Comments)   Sertraline Hcl     REACTION: vivid dreams   Triazolam     REACTION: ? reaction   Zaleplon    Zocor [Simvastatin - High Dose]     achey   Zolpidem Tartrate     hallucinations   Zyrtec [Cetirizine]     Sedation    Hydrocodone Itching and Rash    REACTION: redness rash itching   Norelgestromin-Eth Estradiol     Patch didn't stick to skin   Current Outpatient Medications on File Prior to Visit  Medication Sig Dispense Refill   acetaminophen (TYLENOL) 500 MG tablet Take 500 mg by mouth every 6 (six) hours as needed.      albuterol (VENTOLIN HFA) 108 (90 Base) MCG/ACT inhaler INHALE 2 PUFFS INTO THE LUNGS EVERY 6 HOURS AS NEEDED FOR WHEEZING. 18 g 3   ALPRAZolam (XANAX) 1 MG tablet Take 2 mg by mouth at bedtime.     ammonium lactate (AMLACTIN) 12 % cream Apply 1 Application topically as needed for dry skin. 385 g 5   Bacillus Coagulans-Inulin (PROBIOTIC-PREBIOTIC) 1-250 BILLION-MG CAPS Take 1 Capful by mouth daily. (Patient not taking: Reported on 11/29/2022)     Blood Glucose Monitoring Suppl (FIFTY50 GLUCOSE METER 2.0) w/Device KIT Use as directed     cyclobenzaprine (FLEXERIL) 5 MG tablet TAKE 1 TABLET BY MOUTH 3 TIMES DAILY AS NEEDED FOR MUSCLE SPASMS 90 tablet 3   desvenlafaxine (PRISTIQ) 100 MG 24 hr tablet Take 100 mg by mouth daily.      dimenhyDRINATE (DRAMAMINE) 50 MG tablet Take 50 mg by mouth every 8 (eight) hours as needed.     docusate sodium (COLACE) 100 MG capsule TAKE ONE CAPSULE BY MOUTH TWICE DAILY. 180 capsule 2   fluticasone (FLONASE) 50 MCG/ACT nasal spray Place into both nostrils daily.     gabapentin (NEURONTIN) 100 MG capsule Take 1 capsule (100 mg total) by mouth daily. (Patient taking differently: Take 300 mg by mouth 2 (two) times daily.) 90 capsule 3   ketoconazole (NIZORAL) 2 % cream Apply 1 Application topically daily as needed for irritation. 30 g 0   losartan (COZAAR) 25 MG tablet      minoxidil (LONITEN) 2.5 MG tablet Take 1 tablet (2.5 mg total) by mouth daily. 90 tablet 0   MOUNJARO 12.5 MG/0.5ML Pen Inject 12.5 mg into the skin once a week.     oxybutynin (DITROPAN XL) 15 MG 24 hr tablet TAKE 1 TABLET BY MOUTH DAILY 90 tablet 1   pantoprazole (PROTONIX) 40 MG tablet Take 1 tablet (40 mg total) by mouth 2 (two) times daily. 60 tablet 5   Polyethyl Glycol-Propyl Glycol (SYSTANE OP) Apply 1 drop to eye daily as needed.     pravastatin (PRAVACHOL) 20 MG tablet 1 TABLET BY MOUTH AT BEDTIME. 90 tablet 0   Simethicone (GAS-X PO) Take by mouth as needed.     spironolactone (ALDACTONE) 100 MG tablet TAKE 1 TABLET BY MOUTH DAILY 90 tablet 1   traMADol (ULTRAM) 50 MG tablet TAKE ONE TABLET TWICE A DAY IF NEEDED FOR SEVERE PAIN 30 tablet 0   traZODone (DESYREL) 150 MG tablet Take 300 mg by mouth at bedtime.     TRESIBA FLEXTOUCH 100 UNIT/ML FlexTouch Pen Inject 46 Units into the skin daily.     XEOMIN 50 units SOLR injection Inject 50 Units into the muscle every 3 (three) months.     zinc oxide (BALMEX) 11.3 % CREA cream Apply 1 application topically  2 (two) times daily.     No current facility-administered medications on file prior to visit.    Review of Systems  Constitutional:  Negative for activity change, appetite change, fatigue, fever and unexpected weight change.  HENT:  Negative for congestion, ear  pain, rhinorrhea, sinus pressure and sore throat.   Eyes:  Negative for pain, redness and visual disturbance.  Respiratory:  Negative for cough, shortness of breath and wheezing.   Cardiovascular:  Negative for chest pain and palpitations.  Gastrointestinal:  Negative for abdominal pain, blood in stool, constipation and diarrhea.  Endocrine: Positive for heat intolerance and polyphagia. Negative for polydipsia and polyuria.  Genitourinary:  Negative for dysuria, frequency and urgency.  Musculoskeletal:  Negative for arthralgias, back pain and myalgias.  Skin:  Positive for rash and wound. Negative for pallor.  Allergic/Immunologic: Negative for environmental allergies.  Neurological:  Negative for dizziness, syncope and headaches.  Hematological:  Negative for adenopathy. Does not bruise/bleed easily.  Psychiatric/Behavioral:  Negative for decreased concentration and dysphoric mood. The patient is nervous/anxious.        Objective:   Physical Exam Constitutional:      General: She is not in acute distress.    Appearance: Normal appearance. She is well-developed. She is obese. She is not ill-appearing or diaphoretic.  HENT:     Head: Normocephalic and atraumatic.  Eyes:     General:        Right eye: No discharge.        Left eye: No discharge.     Conjunctiva/sclera: Conjunctivae normal.     Pupils: Pupils are equal, round, and reactive to light.  Neck:     Thyroid: No thyromegaly.     Vascular: No carotid bruit or JVD.  Cardiovascular:     Rate and Rhythm: Normal rate and regular rhythm.     Heart sounds: Normal heart sounds.     No gallop.  Pulmonary:     Effort: Pulmonary effort is normal. No respiratory distress.     Breath sounds: Normal breath sounds. No stridor. No wheezing, rhonchi or rales.  Abdominal:     General: There is no distension or abdominal bruit.     Palpations: Abdomen is soft.     Tenderness: There is no abdominal tenderness.  Musculoskeletal:      Cervical back: Normal range of motion and neck supple.     Right lower leg: No edema.     Left lower leg: No edema.  Lymphadenopathy:     Cervical: No cervical adenopathy.  Skin:    General: Skin is warm and dry.     Coloration: Skin is not pale.     Findings: No rash.     Comments: One small skin abrasion under left side of pannus  No erythema or rash in any skin folds today  Good hygiene   Some flesh colored sks on legs   Solar lentigines diffusely   Neurological:     Mental Status: She is alert.     Coordination: Coordination normal.     Deep Tendon Reflexes: Reflexes are normal and symmetric. Reflexes normal.  Psychiatric:        Attention and Perception: Attention normal.        Mood and Affect: Mood is anxious. Affect is tearful.        Speech: Speech is rapid and pressured.        Behavior: Behavior is not agitated.        Thought Content:  Thought content is not paranoid or delusional.        Cognition and Memory: Cognition normal.     Comments: Candidly discusses symptoms and stressors  Pleasant   Acting more manic than usual             Assessment & Plan:   Problem List Items Addressed This Visit       Digestive   GERD    Recently changed from omeprazole ot protonix 40 mg bid Noted  No clinical changes         Musculoskeletal and Integument   Candidal intertrigo    Intertrigo is improved today  One small skin tear under pannus on left Will continue barrier cream prn  No signs of yeast today  Encouraged to keep area clean and dry as well         Other   Vasomotor symptoms due to menopause    Worse lately after 2 ortho steroid shots   Discussed non hormonal treatments  Is already on gabapentin 100 am 300 pm for neuropathy  Will try increase to 300 mg bid and let us know how it goes If helpful TID dosing is also option   No doubt recent steroid shots worsened this       Steroid side effects - Primary    Pt had 2 steroid shots (each  wrist) in one week Now anxious/ hot and acting manic in office Irritability/trouble sleeping and increased hunger  Explained this will improve over the coming week  Pt voiced understanding and was thankful   Update if not starting to improve in a week or if worsening  Call back and Er precautions noted in detail today        Morbid obesity (HCC)    Pt has continued to loose weight  Having trouble with intertrigo from loose skin  She is contemplating plastic surgery in future       Keratosis    Some small seb keratoses on legs / flesh colored today  Not insect bites Reassurred pt

## 2022-12-28 NOTE — Assessment & Plan Note (Signed)
Intertrigo is improved today  One small skin tear under pannus on left Will continue barrier cream prn  No signs of yeast today  Encouraged to keep area clean and dry as well

## 2022-12-28 NOTE — Assessment & Plan Note (Signed)
Pt has continued to loose weight  Having trouble with intertrigo from loose skin  She is contemplating plastic surgery in future

## 2023-01-04 ENCOUNTER — Telehealth: Payer: Self-pay | Admitting: Family Medicine

## 2023-01-04 NOTE — Telephone Encounter (Signed)
Patient called in to give an update  on Gabapentin medication that Dr Milinda Antis swtiched to 300mg ,she said that it is  making her too sleepy,so shes just taking 100 mg.Her psychiatrist Dr  Evelene Croon is changing her pristiq from 100 mg to 50 mg. She's adding Prozac 20 mg,because she doesn't think she's getting enough serotonin. She just wanted to give this information to Dr Milinda Antis

## 2023-01-04 NOTE — Telephone Encounter (Signed)
That sounds -good /thanks for letting me know  Please adj med list  If she needs anything sent in let me know

## 2023-01-08 ENCOUNTER — Ambulatory Visit: Payer: Medicare PPO | Admitting: Dermatology

## 2023-01-08 DIAGNOSIS — L814 Other melanin hyperpigmentation: Secondary | ICD-10-CM

## 2023-01-08 DIAGNOSIS — L720 Epidermal cyst: Secondary | ICD-10-CM

## 2023-01-08 DIAGNOSIS — L729 Follicular cyst of the skin and subcutaneous tissue, unspecified: Secondary | ICD-10-CM

## 2023-01-08 NOTE — Progress Notes (Signed)
   Follow-Up Visit   Subjective  Mackenzie Bonilla is a 57 y.o. female who presents for the following: cyst on the left upper thigh x 6-12 months. Area is growing.   The patient has spots, moles and lesions to be evaluated, some may be new or changing.    The following portions of the chart were reviewed this encounter and updated as appropriate: medications, allergies, medical history  Review of Systems:  No other skin or systemic complaints except as noted in HPI or Assessment and Plan.  Objective  Well appearing patient in no apparent distress; mood and affect are within normal limits.  A focused examination was performed of the following areas: Face, leg  Relevant physical exam findings are noted in the Assessment and Plan.    Assessment & Plan   EPIDERMAL INCLUSION CYST Exam: 1.3 CM firm subcutaneous nodule with punctum at left upper anterior thigh at skin fold.  Benign-appearing. Exam most consistent with an epidermal inclusion cyst. Discussed that a cyst is a benign growth that can grow over time and sometimes get irritated or inflamed. Recommend observation if it is not bothersome. Discussed option of surgical excision to remove it if it is growing, symptomatic, or other changes noted. Please call for new or changing lesions so they can be evaluated.  LENTIGINES Exam: scattered tan macules Due to sun exposure Treatment Plan: Benign-appearing, observe. Recommend daily broad spectrum sunscreen SPF 30+ to sun-exposed areas, reapply every 2 hours as needed.  Call for any changes   Return as scheduled, for Alopecia.  ICherlyn Labella, CMA, am acting as scribe for Willeen Niece, MD .   Documentation: I have reviewed the above documentation for accuracy and completeness, and I agree with the above.  Willeen Niece, MD

## 2023-01-08 NOTE — Patient Instructions (Addendum)

## 2023-01-17 DIAGNOSIS — H6123 Impacted cerumen, bilateral: Secondary | ICD-10-CM | POA: Diagnosis not present

## 2023-01-17 DIAGNOSIS — H902 Conductive hearing loss, unspecified: Secondary | ICD-10-CM | POA: Diagnosis not present

## 2023-01-18 ENCOUNTER — Ambulatory Visit: Payer: Medicare PPO | Admitting: Podiatry

## 2023-01-18 ENCOUNTER — Encounter: Payer: Self-pay | Admitting: Podiatry

## 2023-01-18 DIAGNOSIS — B351 Tinea unguium: Secondary | ICD-10-CM | POA: Diagnosis not present

## 2023-01-18 DIAGNOSIS — M79675 Pain in left toe(s): Secondary | ICD-10-CM | POA: Diagnosis not present

## 2023-01-18 DIAGNOSIS — M79674 Pain in right toe(s): Secondary | ICD-10-CM

## 2023-01-18 DIAGNOSIS — M7752 Other enthesopathy of left foot: Secondary | ICD-10-CM | POA: Diagnosis not present

## 2023-01-18 MED ORDER — BETAMETHASONE SOD PHOS & ACET 6 (3-3) MG/ML IJ SUSP
3.0000 mg | Freq: Once | INTRAMUSCULAR | Status: AC
Start: 2023-01-18 — End: 2023-01-18
  Administered 2023-01-18: 3 mg via INTRA_ARTICULAR

## 2023-01-18 NOTE — Progress Notes (Signed)
Chief Complaint  Patient presents with   Diabetes    "Diabetic check-up and trim my nails."    SUBJECTIVE Patient presents to office today complaining of elongated, thickened nails that cause pain while ambulating in shoes.  Patient is unable to trim their own nails.  Patient is also been experiencing some pain and tenderness associated to left great toe joint.  Idiopathic onset ongoing for few months now.  Intermittent painful toe depending on activity.  She also says it is more painful when she elevates her foot.  Patient is here for further evaluation and treatment.   Past Medical History:  Diagnosis Date   Allergy    allergic rhinitis   Anemia    Anxiety    Arthritis    osteoarthritis   Asthma    Claustrophobia    does not like oxygen mask covering face   Depression    Diabetes mellitus without complication (HCC)    Fall 2016   Fatty liver 07/2008   abd. ultrasound - fatty liver ; slt dilated cbd (no stones ) 06/10// abd.  ultrasound normal on 04/2006   Fibromyalgia    GERD (gastroesophageal reflux disease)    EGD negative 06/2001// EGD erythematous mucosa, polyp 08/2008   High uric acid in 24 hour urine specimen    Hyperhidrosis    Hyperlipidemia    Kidney stones    Lymphedema    Obesity    PCOS (polycystic ovarian syndrome)    Shortness of breath dyspnea    Tachycardia    TMJ (dislocation of temporomandibular joint)    UTI (lower urinary tract infection)     Allergies  Allergen Reactions   Cephalexin    Codeine     rash   Duloxetine    Levaquin [Levofloxacin] Nausea Only   Lisinopril Cough   Metformin And Related Diarrhea    GI side effects    Quetiapine Other (See Comments)   Sertraline Hcl     REACTION: vivid dreams   Triazolam     REACTION: ? reaction   Zaleplon    Zocor [Simvastatin - High Dose]     achey   Zolpidem Tartrate     hallucinations   Zyrtec [Cetirizine]     Sedation    Hydrocodone Itching and Rash    REACTION: redness rash  itching   Norelgestromin-Eth Estradiol     Patch didn't stick to skin     OBJECTIVE General Patient is awake, alert, and oriented x 3 and in no acute distress. Derm Skin is dry and supple bilateral. Negative open lesions or macerations. Remaining integument unremarkable. Nails are tender, long, thickened and dystrophic with subungual debris, consistent with onychomycosis, 1-5 bilateral. No signs of infection noted. Vasc  DP and PT pedal pulses palpable bilaterally. Temperature gradient within normal limits.  Neuro grossly intact via light touch Musculoskeletal Exam No symptomatic pedal deformities noted bilateral. Muscular strength within normal limits.  There are some pain and tenderness associated to the plantar aspect of the left great toe joint  ASSESSMENT 1.  Pain due to onychomycosis of toenails both 2.  Capsulitis left great toe  PLAN OF CARE -Patient evaluated today.  -Instructed to maintain good pedal hygiene and foot care.  -Mechanical debridement of nails 1-5 bilaterally performed using a nail nipper. Filed with dremel without incident.  -Injection of 4 mg dexamethasone in combination with 1 mL of 2% lidocaine plain injected along the plantar aspect of the left great toe -Return to clinic  in 3 mos.    Felecia Shelling, DPM [facility]  Dr. Felecia Shelling, DPM    [address] [place], [place] [address]                Office [phone]  Fax [phone]

## 2023-01-24 DIAGNOSIS — G5603 Carpal tunnel syndrome, bilateral upper limbs: Secondary | ICD-10-CM | POA: Diagnosis not present

## 2023-01-25 ENCOUNTER — Telehealth: Payer: Self-pay | Admitting: Family Medicine

## 2023-01-25 MED ORDER — TRAMADOL HCL 50 MG PO TABS: 50.0000 mg | ORAL_TABLET | Freq: Two times a day (BID) | ORAL | 0 refills | Status: AC | PRN

## 2023-01-25 NOTE — Telephone Encounter (Signed)
Its ok to use occasionally for severe pain  Can be habit forming so use caution  Can also cause more sedation with some of her other medicines so use with caution   I sent in 30 pills   I reviewed her note  Can try some warm compresses also on tender areas  Hope this helps

## 2023-01-25 NOTE — Telephone Encounter (Signed)
Patient called in and stated that she seen Dr. Rosita Kea and he think she has Costachondritis. She was wanting Dr. Milinda Antis to look over the notes from the appointment she had today.   She is also needing a refill of her traMADol (ULTRAM) 50 MG tablet or something else for pain she is having in her back, neck and other areas. She was wanting to know if Tylenol would be good to take as well. It can be sent over to [facility] - [place], [place] - [address]

## 2023-01-25 NOTE — Telephone Encounter (Signed)
Pt notified of Dr. Tower's comments and verbalized understanding  

## 2023-01-25 NOTE — Telephone Encounter (Signed)
See prev comments.  Tramadol last filled on 09/20/20 #30 tabs/ 0 refills   Last OV here was an acute appt on 12/28/22  Office note from [facility] is in Care Everywhere

## 2023-01-30 DIAGNOSIS — M9905 Segmental and somatic dysfunction of pelvic region: Secondary | ICD-10-CM | POA: Diagnosis not present

## 2023-01-30 DIAGNOSIS — M5431 Sciatica, right side: Secondary | ICD-10-CM | POA: Diagnosis not present

## 2023-01-30 DIAGNOSIS — M9903 Segmental and somatic dysfunction of lumbar region: Secondary | ICD-10-CM | POA: Diagnosis not present

## 2023-01-30 DIAGNOSIS — M9904 Segmental and somatic dysfunction of sacral region: Secondary | ICD-10-CM | POA: Diagnosis not present

## 2023-01-31 DIAGNOSIS — Z0100 Encounter for examination of eyes and vision without abnormal findings: Secondary | ICD-10-CM | POA: Diagnosis not present

## 2023-01-31 DIAGNOSIS — H43813 Vitreous degeneration, bilateral: Secondary | ICD-10-CM | POA: Diagnosis not present

## 2023-01-31 DIAGNOSIS — E119 Type 2 diabetes mellitus without complications: Secondary | ICD-10-CM | POA: Diagnosis not present

## 2023-01-31 DIAGNOSIS — H2513 Age-related nuclear cataract, bilateral: Secondary | ICD-10-CM | POA: Diagnosis not present

## 2023-01-31 LAB — HM DIABETES EYE EXAM

## 2023-02-05 ENCOUNTER — Encounter: Payer: Self-pay | Admitting: Family Medicine

## 2023-02-05 NOTE — Telephone Encounter (Signed)
 Care team updated and letter sent for eye exam notes.

## 2023-02-21 DIAGNOSIS — R809 Proteinuria, unspecified: Secondary | ICD-10-CM | POA: Diagnosis not present

## 2023-02-21 DIAGNOSIS — E1142 Type 2 diabetes mellitus with diabetic polyneuropathy: Secondary | ICD-10-CM | POA: Diagnosis not present

## 2023-02-21 DIAGNOSIS — E1129 Type 2 diabetes mellitus with other diabetic kidney complication: Secondary | ICD-10-CM | POA: Diagnosis not present

## 2023-02-21 DIAGNOSIS — Z794 Long term (current) use of insulin: Secondary | ICD-10-CM | POA: Diagnosis not present

## 2023-02-21 LAB — HEMOGLOBIN A1C: Hemoglobin A1C: 5.3

## 2023-02-21 LAB — PROTEIN / CREATININE RATIO, URINE
Albumin, U: 17
Albumin, U: 17
Creatinine, Urine: 131.5

## 2023-02-21 LAB — MICROALBUMIN / CREATININE URINE RATIO: Microalb Creat Ratio: 12.9

## 2023-02-28 ENCOUNTER — Encounter: Payer: Self-pay | Admitting: Family Medicine

## 2023-02-28 DIAGNOSIS — E1142 Type 2 diabetes mellitus with diabetic polyneuropathy: Secondary | ICD-10-CM | POA: Diagnosis not present

## 2023-02-28 DIAGNOSIS — Z794 Long term (current) use of insulin: Secondary | ICD-10-CM | POA: Diagnosis not present

## 2023-02-28 DIAGNOSIS — E1129 Type 2 diabetes mellitus with other diabetic kidney complication: Secondary | ICD-10-CM | POA: Diagnosis not present

## 2023-02-28 DIAGNOSIS — R809 Proteinuria, unspecified: Secondary | ICD-10-CM | POA: Diagnosis not present

## 2023-03-14 ENCOUNTER — Ambulatory Visit: Payer: Self-pay | Admitting: Family Medicine

## 2023-03-14 ENCOUNTER — Other Ambulatory Visit: Payer: Self-pay | Admitting: Family Medicine

## 2023-03-14 NOTE — Telephone Encounter (Signed)
Not sure what that could be in order to treat it  I need to see her  She can also update her psychiatrist-some psychiatric medicines can cause side effects of involuntary movement-nothing on med list that I can see now but in case she is taking something new  Thanks

## 2023-03-14 NOTE — Telephone Encounter (Signed)
Aware Neuro follow up is a good idea

## 2023-03-14 NOTE — Telephone Encounter (Signed)
Pt notified of Dr. Royden Purl comments. Pt said it's the same facial movements that PCP and her have discussed before but now it has moved to her neck also. Pt said she did also call her neurologist and is just waiting for a call back from them. Pt said she did explain her sxs to her pharmacist and they recommended she take a flexeril which she did (and sent a refill request to PCP) pt said she just took the flexeril 30 min ago so not much difference yet but she will wait on the call back from neuro. Pt declined to schedule appt with PCP   FYI to PCP

## 2023-03-14 NOTE — Telephone Encounter (Signed)
Last filled on 11/29/21 #90 tabs/ 3 refills   Last OV was on 12/28/22

## 2023-03-14 NOTE — Telephone Encounter (Signed)
Copied from CRM [id number]. Topic: Clinical - Red Word Triage >> Mar 14, 2023 10:12 AM Kathryne Eriksson wrote: Red Word that prompted transfer to Nurse Triage: Involuntary Muscle Spasms >> Mar 14, 2023 10:16 AM Kathryne Eriksson wrote: Patient states she's experiencing some involuntary muscle spasms, jerking in the head and neck as well as face. She originally just wants to get a message over to Dr. Milinda Antis in regards to some medication that may can help.    Chief Complaint: facial spasms Symptoms: mouth ears nose and forehead Frequency: chronic issue;worsened the past 2 days Disposition: [] ED /[] Urgent Care (no appt availability in office) / [x] Appointment(In office/virtual)/ []  [place] [place] [facility]/ [] Home Care/ [x] Refused Recommended Disposition /[] [place] [facility]/ []  Follow-up with PCP Additional Notes: The patient has Meige's Syndrome and her facial spasms have worsened in the past 2 days.  She contacted neurology but their response could take up to 72 hours.  She is embarrassed to go in public and is unsure what next steps to take.  She stated that Dr. Milinda Antis is familiar with her condition and inquired if anything could be prescribed to  "calm down the spasms" because they've gotten so much worse the past 2 days.  She has had Xeomin in the past that has been helpful.  She declined an appointment. The patient had a botox injection Spring of last year and is still experiencing residual effects of inability to drink from a straw and some drooling. Reason for Disposition  Muscle jerks, tics, or shudders are a chronic symptom (recurrent or ongoing AND present > 4 weeks)  Answer Assessment - Initial Assessment Questions 1. APPEARANCE of MOVEMENT: "What did the jerking or twitching look like?" (e.g., body area)     Head and neck like a jerk 2. ONSET: "When did this start happening?" (e.g., hours, days, weeks, months ago)     2 days ago 3. DURATION: "How long does the jerk, twitch, or spasm last?"      Constant short moments of rest 4. FREQUENCY:  "How often does this happen?"      Ongoing for 2 days  5. WHEN: "When does this happen?" (e.g., while awake, while falling asleep, while sleeping)     Awake and sleeping  6. CAUSE: "What do you think caused the twitch?"     Meiges Syndrome 7. OTHER SYMPTOMS: "Are there any other symptoms?" (e.g., fever, headache)     Forehead ears nose twitch like muscle spasms and has worsened the past couple of days - Meiges Syndrome  Protocols used: Muscle Jerks - Tics - [employer]

## 2023-03-14 NOTE — Telephone Encounter (Signed)
See triage note. Pt declined to schedule an appt just wants PCP to send in med

## 2023-03-19 DIAGNOSIS — E119 Type 2 diabetes mellitus without complications: Secondary | ICD-10-CM | POA: Diagnosis not present

## 2023-04-09 ENCOUNTER — Other Ambulatory Visit: Payer: Self-pay | Admitting: Family Medicine

## 2023-04-11 DIAGNOSIS — H902 Conductive hearing loss, unspecified: Secondary | ICD-10-CM | POA: Diagnosis not present

## 2023-04-11 DIAGNOSIS — J340 Abscess, furuncle and carbuncle of nose: Secondary | ICD-10-CM | POA: Diagnosis not present

## 2023-04-11 DIAGNOSIS — H6123 Impacted cerumen, bilateral: Secondary | ICD-10-CM | POA: Diagnosis not present

## 2023-04-16 DIAGNOSIS — E119 Type 2 diabetes mellitus without complications: Secondary | ICD-10-CM | POA: Diagnosis not present

## 2023-04-17 DIAGNOSIS — Z8659 Personal history of other mental and behavioral disorders: Secondary | ICD-10-CM | POA: Diagnosis not present

## 2023-04-17 DIAGNOSIS — G245 Blepharospasm: Secondary | ICD-10-CM | POA: Diagnosis not present

## 2023-04-17 DIAGNOSIS — M62838 Other muscle spasm: Secondary | ICD-10-CM | POA: Diagnosis not present

## 2023-04-17 DIAGNOSIS — G5601 Carpal tunnel syndrome, right upper limb: Secondary | ICD-10-CM | POA: Diagnosis not present

## 2023-04-17 DIAGNOSIS — R29810 Facial weakness: Secondary | ICD-10-CM | POA: Diagnosis not present

## 2023-04-23 ENCOUNTER — Encounter: Payer: Self-pay | Admitting: Podiatry

## 2023-04-23 ENCOUNTER — Ambulatory Visit: Payer: Medicare PPO | Admitting: Podiatry

## 2023-04-23 DIAGNOSIS — M7752 Other enthesopathy of left foot: Secondary | ICD-10-CM

## 2023-04-23 DIAGNOSIS — M79674 Pain in right toe(s): Secondary | ICD-10-CM | POA: Diagnosis not present

## 2023-04-23 DIAGNOSIS — M79675 Pain in left toe(s): Secondary | ICD-10-CM | POA: Diagnosis not present

## 2023-04-23 DIAGNOSIS — B351 Tinea unguium: Secondary | ICD-10-CM | POA: Diagnosis not present

## 2023-04-23 NOTE — Progress Notes (Signed)
 Chief Complaint  Patient presents with   Diabetes    "Diabetic check and nails"    SUBJECTIVE Patient presents to office today complaining of elongated, thickened nails that cause pain while ambulating in shoes.  Patient is unable to trim their own nails.  Patient is also been experiencing some pain and tenderness associated to left great toe joint.  Idiopathic onset ongoing for few months now.  Intermittent painful toe depending on activity.  She also says it is more painful when she elevates her foot.  Patient is here for further evaluation and treatment.   Past Medical History:  Diagnosis Date   Allergy    allergic rhinitis   Anemia    Anxiety    Arthritis    osteoarthritis   Asthma    Claustrophobia    does not like oxygen mask covering face   Depression    Diabetes mellitus without complication (HCC)    Fall 2016   Fatty liver 07/2008   abd. ultrasound - fatty liver ; slt dilated cbd (no stones ) 06/10// abd.  ultrasound normal on 04/2006   Fibromyalgia    GERD (gastroesophageal reflux disease)    EGD negative 06/2001// EGD erythematous mucosa, polyp 08/2008   High uric acid in 24 hour urine specimen    Hyperhidrosis    Hyperlipidemia    Kidney stones    Lymphedema    Obesity    PCOS (polycystic ovarian syndrome)    Shortness of breath dyspnea    Tachycardia    TMJ (dislocation of temporomandibular joint)    UTI (lower urinary tract infection)     Allergies  Allergen Reactions   Cephalexin    Codeine     rash   Duloxetine    Levaquin [Levofloxacin] Nausea Only   Lisinopril Cough   Metformin And Related Diarrhea    GI side effects    Quetiapine Other (See Comments)   Sertraline Hcl     REACTION: vivid dreams   Triazolam     REACTION: ? reaction   Zaleplon    Zocor [Simvastatin - High Dose]     achey   Zolpidem Tartrate     hallucinations   Zyrtec [Cetirizine]     Sedation    Hydrocodone Itching and Rash    REACTION: redness rash itching    Norelgestromin-Eth Estradiol     Patch didn't stick to skin     OBJECTIVE General Patient is awake, alert, and oriented x 3 and in no acute distress. Derm Skin is dry and supple bilateral. Negative open lesions or macerations. Remaining integument unremarkable. Nails are tender, long, thickened and dystrophic with subungual debris, consistent with onychomycosis, 1-5 bilateral. No signs of infection noted. Vasc  DP and PT pedal pulses palpable bilaterally. Temperature gradient within normal limits.  Neuro grossly intact via light touch Musculoskeletal Exam No symptomatic pedal deformities noted bilateral. Muscular strength within normal limits.  There are some pain and tenderness associated to the plantar aspect of the left great toe joint  ASSESSMENT 1.  Pain due to onychomycosis of toenails both 2.  Capsulitis left great toe  PLAN OF CARE -Patient evaluated today.  -Instructed to maintain good pedal hygiene and foot care.  -Mechanical debridement of nails 1-5 bilaterally performed using a nail nipper. Filed with dremel without incident.  - Silicone toe caps were provided to apply to the great toes to alleviate pressure from the bilateral great toes -Continue wearing good supportive [place] tennis shoes -Return to  clinic in 3 mos.    Felecia Shelling, DPM [facility]  Dr. Felecia Shelling, DPM    [address] [place], [place] [address]                Office [phone]  Fax [phone]

## 2023-04-25 ENCOUNTER — Ambulatory Visit: Attending: Neurology | Admitting: Speech Pathology

## 2023-04-25 DIAGNOSIS — G244 Idiopathic orofacial dystonia: Secondary | ICD-10-CM | POA: Insufficient documentation

## 2023-04-25 NOTE — Therapy (Signed)
 OUTPATIENT SPEECH LANGUAGE PATHOLOGY  MOTOR SPEECH EVALUATION   Patient Name: Mackenzie Bonilla MRN: [id number] DOB:1965-06-22, 58 y.o., female Today's Date: 04/25/2023  PCP: Marland Kitchen REFERRING PROVIDER: ***   End of Session - 04/25/23 1535     Visit Number 1    SLP Start Time 1445    SLP Stop Time  1525    SLP Time Calculation (min) 40 min    Activity Tolerance Patient tolerated treatment well             No past medical history on file. *** The histories are not reviewed yet. Please review them in the "History" navigator section and refresh this SmartLink. Patient Active Problem List   Diagnosis Date Noted   Steroid side effects 12/28/2022   Dysphagia 11/01/2022   Gastric polyps 11/01/2022   Nontraumatic tear of skin 09/05/2022   Adverse effect of proton pump inhibitor 07/18/2022   Colon cancer screening 07/18/2022   Nasal congestion 04/30/2022   Breast asymmetry 02/21/2022   Muscle twitch 02/21/2022   Abdominal pannus 02/21/2022   Recurrent UTI 12/21/2021   Paresthesia 11/15/2021   Ingrown nail of great toe of left foot 11/15/2021   Keratosis 08/31/2021   Hair loss 06/19/2021   Breast pain 12/20/2020   Encounter for screening mammogram for breast cancer 12/13/2020   Lumbar facet arthropathy 12/29/2019   Chronic bilateral thoracic back pain 12/08/2019   Lumbar degenerative disc disease 12/08/2019   Fibromyalgia 12/08/2019   Chronic pain syndrome 12/08/2019   Lumbar radicular pain (left S1) 12/08/2019   Mid back pain 02/11/2019   Multiple joint pain 11/12/2018   Gallstones 04/19/2017   Diabetic polyneuropathy associated with type 2 diabetes mellitus (HCC) 04/07/2017   Diabetic angiopathy (HCC) 04/07/2017   Vasomotor symptoms due to menopause 04/07/2017   Mobility impaired 11/12/2016   Urinary incontinence 12/02/2015   Candidal intertrigo 04/06/2015   Cystocele 04/06/2015   Constipation 04/06/2015   Myofascial pain syndrome of lumbar spine 12/28/2014    Hypertriglyceridemia 10/14/2014   Abdominal pain 04/23/2012   Uric acid kidney stone 11/20/2011   HYPERHIDROSIS 11/07/2009   Menopausal symptoms 06/17/2009   Type 2 diabetes mellitus without complication, without long-term current use of insulin (HCC) 08/23/2008   Vitamin D deficiency 05/07/2008   Vitamin B 12 deficiency 06/04/2007   CHRONIC FATIGUE SYNDROME 11/15/2006   DIVERTICULITIS, HX OF 11/15/2006   PCOS (polycystic ovarian syndrome) 10/16/2006   Hyperlipidemia associated with type 2 diabetes mellitus (HCC) 10/16/2006   Morbid obesity (HCC) 10/16/2006   Generalized anxiety disorder 10/16/2006   PANIC DISORDER 10/16/2006   BULIMIA 10/16/2006   Chronic depression 10/16/2006   CARPAL TUNNEL SYNDROME 10/16/2006   Lymphedema 10/16/2006   Allergic rhinitis 10/16/2006   Mild intermittent asthma 10/16/2006   Temporomandibular joint disorder 10/16/2006   GERD 10/16/2006   Irritable bowel syndrome 10/16/2006   Osteoarthritis 10/16/2006   COUGH, CHRONIC 10/16/2006    ONSET DATE: ***   REFERRING DIAG: ***  THERAPY DIAG:  Meige syndrome (blepharospasm with oromandibular dystonia)  Rationale for Evaluation and Treatment {HABREHAB:27488}  SUBJECTIVE:   SUBJECTIVE STATEMENT: *** Pt accompanied by: {accompnied:27141}  PERTINENT HISTORY: ***  DIAGNOSTIC FINDINGS: ***  PAIN:  Are you having pain? {OPRCPAIN:27236}   FALLS: Has patient fallen in last 6 months?  {NUUVOZDG:64403}  LIVING ENVIRONMENT: Lives with: {OPRC lives with:25569::"lives with their family"} Lives in: {Lives in:25570}  PLOF:  Level of assistance: {KVQQVZD:63875} Employment: {SLPemployment:25674}   PATIENT GOALS      ***  OBJECTIVE:  COGNITION:  Overall cognitive status: {cognition:24006} Areas of impairment:  {ARMCcognitiveimpairment:28858} Functional deficits: ***  MOTOR SPEECH: Overall motor speech: {slpimpaired:27210} Level of impairment: {SLP level of impairment:25441} Respiration:  {respbreathing:27195} Phonation: {SLP phonation:25439} Resonance: {SLP resonance:25440} Articulation: {SLParticulation:27218} Intelligibility: {SLP Intelligible:25442} Motor planning: {slpmotorspeecherrors:27220} Motor speech errors: {SLP motor speech errors:25443} Interfering components: {SLP Interfering components (MS):25444} Effective technique: {SLP effective technique (MS):25445}  ORAL MOTOR EXAMINATION: Facial : {OM Face:25663} Lingual: {OM Lingual:25664} Velum: {OM Velum:25665} Mandible: {OM mandible:25667} Cough: {OM Cough:25660} Voice: {OM Voice:25662}    RESPIRATION: {speechrespiration:27339}  PHONATION:  Voice quality: {VQL:27192}  {Voice Measurements:27784}   RESONANCE: {speechresonance:27340}  ARTICULATION: Alternating Motion Rate: *** (30-35 repetitions/ 5 seconds average for /p/) Sequential Motion Rate: {sequentialmotionrate:27338} Connected Speech characteristics: {speechcharacteristics:27341} Intelligibility: For trained listener with context in a quiet environment, intelligibility rated as approximately *** % Non-verbal oral apraxia: Not present    ORAL MOTOR ASSESSMENT:   {oralmotorassess:27241}   PATIENT REPORTED OUTCOME MEASURES (PROM): {SLPPROM:27785}   TODAY'S TREATMENT: ***     PATIENT EDUCATION: Education details: *** Person educated: {Person educated:25204} Education method: {Education Method:25205} Education comprehension: {Education Comprehension:25206}   HOME EXERCISE PROGRAM: ***  GOALS: Goals reviewed with patient? {yes/no:20286}  SHORT TERM GOALS: Target date: 10 sessions  *** Baseline: Goal status: {GOALSTATUS:25110}  2.  *** Baseline:  Goal status: {GOALSTATUS:25110}  3.  *** Baseline:  Goal status: {GOALSTATUS:25110}  4.  *** Baseline:  Goal status: {GOALSTATUS:25110}  5.  *** Baseline:  Goal status: {GOALSTATUS:25110}  6.  *** Baseline:  Goal status: {GOALSTATUS:25110}  LONG TERM GOALS: Target  date:   *** Baseline:  Goal status: {GOALSTATUS:25110}  2.  *** Baseline:  Goal status: {GOALSTATUS:25110}  3.  *** Baseline:  Goal status: {GOALSTATUS:25110}  4.  *** Baseline:  Goal status: {GOALSTATUS:25110}  5.  *** Baseline:  Goal status: {GOALSTATUS:25110}  6.  *** Baseline:  Goal status: {GOALSTATUS:25110}  ASSESSMENT:  CLINICAL IMPRESSION: Patient is a *** y.o. *** who was seen today for ***.   OBJECTIVE IMPAIRMENTS include {SLPOBJIMP:27107}. These impairments are limiting patient from {SLPLIMIT:27108}. Factors affecting potential to achieve goals and functional outcome are {SLP factors:25450}. Patient will benefit from skilled SLP services to address above impairments and improve overall function.  REHAB POTENTIAL: {rehabpotential:25112}  PLAN: SLP FREQUENCY: {rehab frequency:25116}  SLP DURATION: {rehab duration:25117}  PLANNED INTERVENTIONS: {SLP treatment/interventions:25449}

## 2023-05-01 ENCOUNTER — Ambulatory Visit: Payer: Self-pay | Admitting: Family Medicine

## 2023-05-01 NOTE — Telephone Encounter (Signed)
 Copied from CRM [id number]. Topic: Clinical - Red Word Triage >> May 01, 2023  8:59 AM Isabell A wrote: Red Word that prompted transfer to Nurse Triage: Patient had a fall over the weekend and hurt her right knee - experiencing soreness, states she an ongoing condition with her knee + arthritis.   Chief Complaint: Fall Symptoms: Knee Pain Frequency: Since Saturday Pertinent Negatives: Patient denies numbness, weakness, or severe pain Disposition: [] ED /[] Urgent Care (no appt availability in office) / [x] Appointment(In office/virtual)/ []  [place] [facility]/ [] Home Care/ [] Refused Recommended Disposition /[] [place] [facility]/ []  Follow-up with PCP Additional Notes: Patient contacted via phone regarding concerns of Knee Pain after a fall over the weekend. Discussed symptoms, severity, and duration. Based on assessment, patient was advised to see provider in office. Patient verbalized understanding and agreement with plan. Documentation provided.    Reason for Disposition  MILD weakness (i.e., does not interfere with ability to work, go to school, normal activities)  (Exception: Mild weakness is a chronic symptom.)  Answer Assessment - Initial Assessment Questions 1. MECHANISM: "How did the fall happen?"     Missed a step, fell forward  2. DOMESTIC VIOLENCE AND ELDER ABUSE SCREENING: "Did you fall because someone pushed you or tried to hurt you?" If Yes, ask: "Are you safe now?"     No  3. ONSET: "When did the fall happen?" (e.g., minutes, hours, or days ago)     Saturday  4. LOCATION: "What part of the body hit the ground?" (e.g., back, buttocks, head, hips, knees, hands, head, stomach)       5. INJURY: "Did you hurt (injure) yourself when you fell?" If Yes, ask: "What did you injure? Tell me more about this?" (e.g., body area; type of injury; pain severity)"     Knee is sore  6. PAIN: "Is there any pain?" If Yes, ask: "How bad is the pain?" (e.g., Scale 1-10; or mild,   moderate, severe)   - NONE (0): No pain   - MILD (1-3): Doesn't interfere with normal activities    - MODERATE (4-7): Interferes with normal activities or awakens from sleep    - SEVERE (8-10): Excruciating pain, unable to do any normal activities      3  7. SIZE: For cuts, bruises, or swelling, ask: "How large is it?" (e.g., inches or centimeters)      Ankle Swelling, Hx of Lymphedema Bruise on the ankle  8. PREGNANCY: "Is there any chance you are pregnant?" "When was your last menstrual period?"     No and No  9. OTHER SYMPTOMS: "Do you have any other symptoms?" (e.g., dizziness, fever, weakness; new onset or worsening).      No other symptoms  10. CAUSE: "What do you think caused the fall (or falling)?" (e.g., tripped, dizzy spell)       Missed a step  Protocols used: Falls and [facility]

## 2023-05-01 NOTE — Progress Notes (Unsigned)
   Bracy Pepper T. Hannan Tetzlaff, MD, CAQ Sports Medicine [facility] at [facility] [address] [place], [address]  Phone: [phone]  FAX: [phone]  Mackenzie Bonilla - 58 y.o. female  MRN [id number]  Date of Birth: [birth year]  Date: 05/02/2023  PCP: Judy Pimple, MD  Referral: Judy Pimple, MD  No chief complaint on file.  Subjective:   Mackenzie Bonilla is a 58 y.o. very pleasant female patient with There is no height or weight on file to calculate BMI. who presents with the following:  Mackenzie Bonilla is a very nice patient who I have known for many years.  She presents with some ongoing knee pain after a fall.    Review of Systems is noted in the HPI, as appropriate  Objective:   There were no vitals taken for this visit.  GEN: No acute distress; alert,appropriate. PULM: Breathing comfortably in no respiratory distress PSYCH: Normally interactive.   Laboratory and Imaging Data:  Assessment and Plan:   ***

## 2023-05-02 ENCOUNTER — Encounter: Payer: Self-pay | Admitting: Family Medicine

## 2023-05-02 ENCOUNTER — Ambulatory Visit: Admitting: Family Medicine

## 2023-05-02 ENCOUNTER — Ambulatory Visit (INDEPENDENT_AMBULATORY_CARE_PROVIDER_SITE_OTHER)
Admission: RE | Admit: 2023-05-02 | Discharge: 2023-05-02 | Disposition: A | Discharge status: Home / Self Care | Source: Ambulatory Visit | Attending: Family Medicine | Admitting: Family Medicine

## 2023-05-02 VITALS — BP 90/64 | HR 87 | Temp 97.7°F | Ht 64.0 in | Wt 244.0 lb

## 2023-05-02 DIAGNOSIS — M85861 Other specified disorders of bone density and structure, right lower leg: Secondary | ICD-10-CM | POA: Diagnosis not present

## 2023-05-02 DIAGNOSIS — M1711 Unilateral primary osteoarthritis, right knee: Secondary | ICD-10-CM | POA: Diagnosis not present

## 2023-05-02 DIAGNOSIS — M25561 Pain in right knee: Secondary | ICD-10-CM

## 2023-05-02 DIAGNOSIS — G8929 Other chronic pain: Secondary | ICD-10-CM

## 2023-05-02 MED ORDER — TRIAMCINOLONE ACETONIDE 40 MG/ML IJ SUSP
40.0000 mg | Freq: Once | INTRAMUSCULAR | Status: AC
  Administered 2023-05-02: 40 mg via INTRA_ARTICULAR

## 2023-05-06 ENCOUNTER — Ambulatory Visit: Payer: Self-pay

## 2023-05-06 NOTE — Telephone Encounter (Signed)
  Chief Complaint: mid back and pain behind the breastbone, hx of costochondritis states this feels the same Symptoms: pain Frequency: months Pertinent Negatives: Patient denies fever, SOB Disposition: [] ED /[] Urgent Care (no appt availability in office) / [x] Appointment(In office/virtual)/ []  [place] [facility]/ [] Home Care/ [x] Refused Recommended Disposition /[] [place] [facility]/ []  Follow-up with PCP Additional Notes: Pt states that she is having her costochondritis pain, pt states that this has been ongoing intermittently for "months". Pt states that she has tried tylenol, ibuprofen and tramadol, nothing relieves the pain. Pt states pain is behind the breast bone through to the back, states same pain she has always had since dx of costochondritis. Pt states that it is worse with lifting heavy objects and she has been lifting a lot of boxes d/t moving stuff for home remodels. RN advised that pt should be seen, pt refused. Requests PCP write rx for medication.  Copied from CRM [id number]. Topic: Clinical - Red Word Triage >> May 06, 2023  2:12 PM Gurney Maxin H wrote: [facility] that prompted transfer to Nurse Triage: Patient has Costochondritis and in pain, hurts in the middle of back and front of chest, getting really bad. Reason for Disposition  [1] MODERATE back pain (e.g., interferes with normal activities) AND [2] present > 3 days  Answer Assessment - Initial Assessment Questions 1. ONSET: "When did the pain begin?"      Intermittent pain for months 2. LOCATION: "Where does it hurt?" (upper, mid or lower back)     Upper middle back to behind sternum 3. SEVERITY: "How bad is the pain?"  (e.g., Scale 1-10; mild, moderate, or severe)   - MILD (1-3): Doesn't interfere with normal activities.    - MODERATE (4-7): Interferes with normal activities or awakens from sleep.    - SEVERE (8-10): Excruciating pain, unable to do any normal activities.      8 4. PATTERN: "Is the pain constant?"  (e.g., yes, no; constant, intermittent)      intermittent 5. RADIATION: "Does the pain shoot into your legs or somewhere else?"     Neck and shoulders 6. CAUSE:  "What do you think is causing the back pain?"      costochondritis 7. BACK OVERUSE:  "Any recent lifting of heavy objects, strenuous work or exercise?"     Always lifting heavy objects 8. MEDICINES: "What have you taken so far for the pain?" (e.g., nothing, acetaminophen, NSAIDS)     Ibuprofen and tylenol and tramadol 9. NEUROLOGIC SYMPTOMS: "Do you have any weakness, numbness, or problems with bowel/bladder control?"     denies 10. OTHER SYMPTOMS: "Do you have any other symptoms?" (e.g., fever, abdomen pain, burning with urination, blood in urine)       denies  Protocols used: Back Pain-A-AH

## 2023-05-06 NOTE — Telephone Encounter (Signed)
Pt notified of Dr. Tower's comments and appt scheduled  

## 2023-05-06 NOTE — Telephone Encounter (Signed)
 Needs appointment  Warm compress may help in the interim

## 2023-05-07 ENCOUNTER — Ambulatory Visit: Admitting: Family Medicine

## 2023-05-17 DIAGNOSIS — E119 Type 2 diabetes mellitus without complications: Secondary | ICD-10-CM | POA: Diagnosis not present

## 2023-05-21 DIAGNOSIS — G244 Idiopathic orofacial dystonia: Secondary | ICD-10-CM | POA: Diagnosis not present

## 2023-05-21 DIAGNOSIS — R4189 Other symptoms and signs involving cognitive functions and awareness: Secondary | ICD-10-CM | POA: Diagnosis not present

## 2023-05-21 DIAGNOSIS — Z8659 Personal history of other mental and behavioral disorders: Secondary | ICD-10-CM | POA: Diagnosis not present

## 2023-05-21 DIAGNOSIS — E1142 Type 2 diabetes mellitus with diabetic polyneuropathy: Secondary | ICD-10-CM | POA: Diagnosis not present

## 2023-05-23 ENCOUNTER — Other Ambulatory Visit: Payer: Self-pay | Admitting: Family Medicine

## 2023-06-03 ENCOUNTER — Ambulatory Visit: Payer: Self-pay

## 2023-06-03 NOTE — Telephone Encounter (Signed)
 Will see her as planned Continue the barrier cream until then

## 2023-06-03 NOTE — Telephone Encounter (Signed)
  Chief Complaint: Skin wound Symptoms: open skin Frequency: chronic Pertinent Negatives: Patient denies signs of infections Disposition: [] ED /[] Urgent Care (no appt availability in office) / [x] Appointment(In office/virtual)/ []  [place] [facility]/ [] Home Care/ [] Refused Recommended Disposition /[] [place] [facility]/ []  Follow-up with PCP Additional Notes:  Spoke with patient to triage skin "lesions", she initially scheduled herself via MyChart on Friday. She has chronic wounds in creases of thighs and hips that she manages with OTC creams. Calling today because wounds are open and occasionally bleeding. She has been using OTC antibiotic ointment and zinc oxide which normally work well but this time wounds are open. She does not she needs to use a different cream other than zinc recently and wonders if this is the cause. Denies signs of infection at this time. Acute scheduled with PCP 06/04/23. Cancelled self scheduled appointment. Educated on care advice as documented in protocol, patient verbalized understanding. Discussed reasons to call back.    Message from [place] H sent at 06/03/2023  9:08 AM EDT  Copied From CRM [id number]. Reason for Triage: Patient called regarding her upcoming appointment on 06/07/23 with her PCP. She is requesting an earlier appointment this week due to lesions on both thighs and a rash on her hips. She stated she is also experiencing weight loss and noted that the lesions and rash are not resolving as quickly as they usually do. I reviewed Dr. Cristine Done schedule and informed her that the earliest available appointment is this Friday. Patient stated her PCP is aware of the issue and she prefers to continue care with her.   Reason for Disposition . [1] Wound > 48 hours old AND [2] becoming more painful or tender to touch  Protocols used: Wound Infection Suspected-A-AH

## 2023-06-04 ENCOUNTER — Ambulatory Visit: Admitting: Family Medicine

## 2023-06-04 ENCOUNTER — Encounter: Payer: Self-pay | Admitting: Family Medicine

## 2023-06-04 VITALS — BP 126/60 | HR 77 | Temp 98.3°F | Ht 64.0 in | Wt 240.0 lb

## 2023-06-04 DIAGNOSIS — B372 Candidiasis of skin and nail: Secondary | ICD-10-CM | POA: Diagnosis not present

## 2023-06-04 DIAGNOSIS — E1142 Type 2 diabetes mellitus with diabetic polyneuropathy: Secondary | ICD-10-CM | POA: Diagnosis not present

## 2023-06-04 DIAGNOSIS — F32A Depression, unspecified: Secondary | ICD-10-CM | POA: Diagnosis not present

## 2023-06-04 DIAGNOSIS — Z7985 Long-term (current) use of injectable non-insulin antidiabetic drugs: Secondary | ICD-10-CM | POA: Diagnosis not present

## 2023-06-04 DIAGNOSIS — T148XXA Other injury of unspecified body region, initial encounter: Secondary | ICD-10-CM

## 2023-06-04 MED ORDER — GABAPENTIN 100 MG PO CAPS: 100.0000 mg | ORAL_CAPSULE | Freq: Every day | ORAL | 1 refills | Status: DC

## 2023-06-04 NOTE — Assessment & Plan Note (Signed)
 Continues  Pristiq 50 mg daily  Fluoxetine 20 mg daily  Xanax at bedtime   Takes gabapentin  for other dx Sees dr Dearl Fabry afford visits as often as she needs/ has looked for psychiatrist that takes her medicare and unable so far

## 2023-06-04 NOTE — Assessment & Plan Note (Signed)
 Plan to take over gabapenting from podiatry  Pt takes for this and also hot flashes 100 mg with bkfast 300 mg at night    Refilled 100 mg today  300 mg will be due in later may and pt will call here to transfer it to me  Has been helpful

## 2023-06-04 NOTE — Assessment & Plan Note (Signed)
 Continues to loose weight with glp-1  Encouraged to talk to her psychiatrist about binge eating behavior

## 2023-06-04 NOTE — Assessment & Plan Note (Addendum)
 Under pannus - on both left and right  Today no redness or signs of bacterial or fungal infection  Caused by friction  Pt is unable to tolerate compression shorts or other garments to separate skin folds  Considering plastic surgery when she reaches goal weight  (may end up having to go to [facility] or [facility]  For now -keep clean and as dry as possible  Barrier cream or choice or aquaphor to protect   Call back and Er precautions noted in detail today

## 2023-06-04 NOTE — Progress Notes (Signed)
 Subjective:    Patient ID: Mackenzie Bonilla, female    DOB: [birth year], 58 y.o.   MRN: [id number]  HPI  Wt Readings from Last 3 Encounters:  06/04/23 240 lb (108.9 kg)  05/02/23 244 lb (110.7 kg)  12/28/22 255 lb 8 oz (115.9 kg)   41.20 kg/m  Vitals:   06/04/23 1214  BP: 126/60  Pulse: 77  Temp: 98.3 F (36.8 C)  SpO2: 97%   Here for wounds / recurrent  Rash on right hip/ leg  To discuss gabapentin  prescription   Has history of candidal intertrigo  Has barrier cream for area under pannus Has used ketoconazole  in the past   Compression shorts - were very painful    History of skin tears in skin folds as well  Left hip area  Uses triple antibiotic and Butt paste on it    Right side in groin was very red and more sore recently  There was some blood on underwear   More constipated recently  Needs to get back on the miralax   Seeing Dr Mason Sole for several issues  Waiting for some tests  For facial twitching and meige syndrome and cognitive issues      PHQ score up today     06/04/2023    2:03 PM 06/04/2023   12:24 PM 11/07/2022    1:29 PM 08/13/2022   12:20 PM 04/30/2022    2:32 PM  Depression screen PHQ 2/9  Decreased Interest 2 3 1 2 2   Down, Depressed, Hopeless 2 3 1 2 2   PHQ - 2 Score 4 6 2 4 4   Altered sleeping 2 3 1 3 3   Tired, decreased energy 3 3 1 3 3   Change in appetite 2 3 1 3 2   Feeling bad or failure about yourself  2 3  3 2   Trouble concentrating 2 3 1 3 3   Moving slowly or fidgety/restless 1 3 0 2 2  Suicidal thoughts 1 3 0 0 0  PHQ-9 Score 17 27 6 21 19   Difficult doing work/chores  Very difficult  Very difficult Very difficult  Pt notes she just marked answer 3 for everything because she did not feel like reading the options  Is NOT suicidal at all  Does note depression and has been down lately   Trying to get away from pain med  Getting by with tylenol  and salon pas  Getting kitchen fixed/ air conditioner broke - a lot of stress  from that   Some binge eating behavior recently when stressed and down   Sees psychiatry Dr Deborra Falter (visit last month) - cannot afford extra visits with her due to the cost   Pristiq 50 mg daily  Fluoxetine 20 mg daily   Her medicines are causing very dry mouth   She was sent to [facility]/ did not take her insurance     Gabapentin  from podiatry for neuropathy  Also for hot flashes  300 mg at night  100 mg in day time     Last refill of 90  300 mg caps on 04/09/23 from Dr Luster Salters and 90 100 mg pills on 1/30 from [facility]    Patient Active Problem List   Diagnosis Date Noted   Steroid side effects 12/28/2022   Dysphagia 11/01/2022   Gastric polyps 11/01/2022   Nontraumatic tear of skin 09/05/2022   Adverse effect of proton pump inhibitor 07/18/2022   Colon cancer screening 07/18/2022   Nasal congestion 04/30/2022  Breast asymmetry 02/21/2022   Muscle twitch 02/21/2022   Abdominal pannus 02/21/2022   Recurrent UTI 12/21/2021   Paresthesia 11/15/2021   Ingrown nail of great toe of left foot 11/15/2021   Keratosis 08/31/2021   Hair loss 06/19/2021   Breast pain 12/20/2020   Encounter for screening mammogram for breast cancer 12/13/2020   Lumbar facet arthropathy 12/29/2019   Chronic bilateral thoracic back pain 12/08/2019   Lumbar degenerative disc disease 12/08/2019   Fibromyalgia 12/08/2019   Chronic pain syndrome 12/08/2019   Lumbar radicular pain (left S1) 12/08/2019   Mid back pain 02/11/2019   Multiple joint pain 11/12/2018   Gallstones 04/19/2017   Diabetic polyneuropathy associated with type 2 diabetes mellitus (HCC) 04/07/2017   Diabetic angiopathy (HCC) 04/07/2017   Vasomotor symptoms due to menopause 04/07/2017   Mobility impaired 11/12/2016   Urinary incontinence 12/02/2015   Candidal intertrigo 04/06/2015   Cystocele 04/06/2015   Constipation 04/06/2015   Myofascial pain syndrome of lumbar spine 12/28/2014   Hypertriglyceridemia 10/14/2014    Abdominal pain 04/23/2012   Uric acid kidney stone 11/20/2011   HYPERHIDROSIS 11/07/2009   Menopausal symptoms 06/17/2009   Type 2 diabetes mellitus without complication, without long-term current use of insulin (HCC) 08/23/2008   Vitamin D  deficiency 05/07/2008   Vitamin B 12 deficiency 06/04/2007   CHRONIC FATIGUE SYNDROME 11/15/2006   DIVERTICULITIS, HX OF 11/15/2006   PCOS (polycystic ovarian syndrome) 10/16/2006   Hyperlipidemia associated with type 2 diabetes mellitus (HCC) 10/16/2006   Morbid obesity (HCC) 10/16/2006   Generalized anxiety disorder 10/16/2006   PANIC DISORDER 10/16/2006   BULIMIA 10/16/2006   Chronic depression 10/16/2006   CARPAL TUNNEL SYNDROME 10/16/2006   Lymphedema 10/16/2006   Allergic rhinitis 10/16/2006   Mild intermittent asthma 10/16/2006   Temporomandibular joint disorder 10/16/2006   GERD 10/16/2006   Irritable bowel syndrome 10/16/2006   Osteoarthritis 10/16/2006   COUGH, CHRONIC 10/16/2006   Past Medical History:  Diagnosis Date   Allergy    allergic rhinitis   Anemia    Anxiety    Arthritis    osteoarthritis   Asthma    Claustrophobia    does not like oxygen mask covering face   Depression    Diabetes mellitus without complication (HCC)    Fatty liver 07/2008   abd. ultrasound - fatty liver ; slt dilated cbd (no stones ) 06/10// abd.  ultrasound normal on 04/2006   Fibromyalgia    GERD (gastroesophageal reflux disease)    EGD negative 06/2001// EGD erythematous mucosa, polyp 08/2008   High uric acid in 24 hour urine specimen    Hyperhidrosis    Hyperlipidemia    Kidney stones    Lymphedema    Obesity    PCOS (polycystic ovarian syndrome)    Tachycardia    TMJ (dislocation of temporomandibular joint)    Past Surgical History:  Procedure Laterality Date   BIOPSY  11/01/2022   Procedure: BIOPSY;  Surgeon: Marnee Sink, MD;  Location: [facility] ENDOSCOPY;  Service: Endoscopy;;   BREAST BIOPSY Right 2016   neg   BREAST  LUMPECTOMY WITH RADIOACTIVE SEED LOCALIZATION Right 08/02/2014   Procedure: BREAST LUMPECTOMY WITH RADIOACTIVE SEED LOCALIZATION;  Surgeon: Enid Harry, MD;  Location: [facility] OR;  Service: General;  Laterality: Right;   COLONOSCOPY  08/12/2012   Completed by Dr. Willistine Hartigan, normal exam.    COLONOSCOPY WITH PROPOFOL  N/A 11/01/2022   Procedure: COLONOSCOPY WITH PROPOFOL ;  Surgeon: Marnee Sink, MD;  Location: [facility] ENDOSCOPY;  Service: Endoscopy;  Laterality:  N/A;   ENDOMETRIAL BIOPSY  01/2004   ESOPHAGOGASTRODUODENOSCOPY     ESOPHAGOGASTRODUODENOSCOPY (EGD) WITH PROPOFOL  N/A 08/18/2019   Procedure: ESOPHAGOGASTRODUODENOSCOPY (EGD) WITH PROPOFOL ;  Surgeon: Marnee Sink, MD;  Location: [facility] ENDOSCOPY;  Service: Endoscopy;  Laterality: N/A;   ESOPHAGOGASTRODUODENOSCOPY (EGD) WITH PROPOFOL  N/A 11/01/2022   Procedure: ESOPHAGOGASTRODUODENOSCOPY (EGD) WITH PROPOFOL ;  Surgeon: Marnee Sink, MD;  Location: [facility] ENDOSCOPY;  Service: Endoscopy;  Laterality: N/A;   FOOT SURGERY     SEPTOPLASTY     two times   TONSILLECTOMY     UTERINE FIBROID SURGERY  06/2005   ablation   uterine tumor  09/2001   VAGUS NERVE STIMULATOR INSERTION     Social History   Tobacco Use   Smoking status: Never   Smokeless tobacco: Never  Vaping Use   Vaping status: Never Used  Substance Use Topics   Alcohol use: No    Alcohol/week: 0.0 standard drinks of alcohol   Drug use: No   Family History  Problem Relation Age of Onset   Hypertension Father    Cancer Father        pancreatic cancer   Hypertension Sister    Heart disease Maternal Grandmother 10       MI   Diabetes Maternal Grandmother    Heart disease Paternal Grandmother 54       MI   Diabetes Paternal Grandmother    Breast cancer Neg Hx    Allergies  Allergen Reactions   Cephalexin    Codeine     rash   Duloxetine    Levaquin  [Levofloxacin ] Nausea Only   Lisinopril Cough   Metformin  And Related Diarrhea    GI side effects    Quetiapine Other (See  Comments)   Sertraline Hcl     REACTION: vivid dreams   Triazolam     REACTION: ? reaction   Zaleplon    Zocor [Simvastatin - High Dose]     achey   Zolpidem Tartrate     hallucinations   Zyrtec [Cetirizine]     Sedation    Hydrocodone  Itching and Rash    REACTION: redness rash itching   Norelgestromin-Eth Estradiol      Patch didn't stick to skin   Current Outpatient Medications on File Prior to Visit  Medication Sig Dispense Refill   acetaminophen  (TYLENOL ) 500 MG tablet Take 500 mg by mouth every 6 (six) hours as needed.      albuterol  (VENTOLIN  HFA) 108 (90 Base) MCG/ACT inhaler INHALE 2 PUFFS INTO THE LUNGS EVERY 6 HOURS AS NEEDED FOR WHEEZING. 18 g 3   ALPRAZolam (XANAX) 1 MG tablet Take 2 mg by mouth at bedtime.     ammonium lactate  (AMLACTIN) 12 % cream Apply 1 Application topically as needed for dry skin. 385 g 5   BD PEN NEEDLE NANO U/F 32G X 4 MM MISC daily.     Blood Glucose Monitoring Suppl (FIFTY50 GLUCOSE METER 2.0) w/Device KIT Use as directed     cyclobenzaprine  (FLEXERIL ) 5 MG tablet TAKE 1 TABLET BY MOUTH 3 TIMES DAILY AS NEEDED FOR MUSCLE SPASMS 90 tablet 0   desvenlafaxine (PRISTIQ) 50 MG 24 hr tablet Take 50 mg by mouth daily.     dimenhyDRINATE (DRAMAMINE) 50 MG tablet Take 50 mg by mouth every 8 (eight) hours as needed.     FLUoxetine (PROZAC) 20 MG capsule Take 20 mg by mouth daily.     fluticasone  (FLONASE ) 50 MCG/ACT nasal spray Place into both nostrils daily.  gabapentin  (NEURONTIN ) 300 MG capsule Take 300 mg by mouth daily.     ketoconazole  (NIZORAL ) 2 % cream Apply 1 Application topically daily as needed for irritation. 30 g 0   losartan (COZAAR) 25 MG tablet      minoxidil  (LONITEN ) 2.5 MG tablet Take 1 tablet (2.5 mg total) by mouth daily. 90 tablet 0   MOUNJARO 12.5 MG/0.5ML Pen Inject 12.5 mg into the skin once a week.     oxybutynin  (DITROPAN  XL) 15 MG 24 hr tablet TAKE 1 TABLET BY MOUTH DAILY 90 tablet 1   pantoprazole  (PROTONIX ) 40 MG tablet  Take 1 tablet (40 mg total) by mouth 2 (two) times daily. 60 tablet 5   Polyethyl Glycol-Propyl Glycol (SYSTANE OP) Apply 1 drop to eye daily as needed.     pravastatin  (PRAVACHOL ) 20 MG tablet TAKE 1 TABLET BY MOUTH AT BEDTIME 90 tablet 0   Simethicone (GAS-X PO) Take by mouth as needed.     spironolactone  (ALDACTONE ) 100 MG tablet TAKE 1 TABLET BY MOUTH DAILY 90 tablet 1   traMADol  (ULTRAM ) 50 MG tablet Take 1 tablet (50 mg total) by mouth every 12 (twelve) hours as needed for severe pain (pain score 7-10). Caution of sedation 30 tablet 0   traZODone (DESYREL) 150 MG tablet Take 300 mg by mouth at bedtime.     TRESIBA FLEXTOUCH 100 UNIT/ML FlexTouch Pen Inject 36 Units into the skin daily.     trihexyphenidyl (ARTANE) 2 MG tablet Take by mouth 3 (three) times daily with meals.     XEOMIN 50 units SOLR injection Inject 50 Units into the muscle every 3 (three) months.     zinc oxide (BALMEX) 11.3 % CREA cream Apply 1 application topically 2 (two) times daily.     No current facility-administered medications on file prior to visit.    Review of Systems  Constitutional:  Negative for activity change, appetite change, fatigue, fever and unexpected weight change.  HENT:  Negative for congestion, ear pain, rhinorrhea, sinus pressure and sore throat.   Eyes:  Negative for pain, redness and visual disturbance.  Respiratory:  Negative for cough, shortness of breath and wheezing.   Cardiovascular:  Negative for chest pain and palpitations.  Gastrointestinal:  Negative for abdominal pain, blood in stool, constipation and diarrhea.  Endocrine: Negative for polydipsia and polyuria.  Genitourinary:  Negative for dysuria, frequency and urgency.  Musculoskeletal:  Negative for arthralgias, back pain and myalgias.  Skin:  Positive for wound. Negative for pallor and rash.  Allergic/Immunologic: Negative for environmental allergies.  Neurological:  Positive for numbness. Negative for dizziness, syncope and  headaches.  Hematological:  Negative for adenopathy. Does not bruise/bleed easily.  Psychiatric/Behavioral:  Positive for dysphoric mood and sleep disturbance. Negative for decreased concentration, self-injury and suicidal ideas. The patient is nervous/anxious.        Objective:   Physical Exam Constitutional:      General: She is not in acute distress.    Appearance: Normal appearance. She is well-developed. She is not ill-appearing or diaphoretic.  HENT:     Head: Normocephalic and atraumatic.  Eyes:     Conjunctiva/sclera: Conjunctivae normal.     Pupils: Pupils are equal, round, and reactive to light.  Neck:     Thyroid : No thyromegaly.     Vascular: No carotid bruit or JVD.  Cardiovascular:     Rate and Rhythm: Normal rate and regular rhythm.     Heart sounds: Normal heart sounds.  No gallop.  Pulmonary:     Effort: Pulmonary effort is normal. No respiratory distress.     Breath sounds: Normal breath sounds. No wheezing or rales.  Abdominal:     General: There is no distension or abdominal bruit.     Palpations: Abdomen is soft.  Musculoskeletal:     Cervical back: Normal range of motion and neck supple.     Right lower leg: No edema.     Left lower leg: No edema.  Lymphadenopathy:     Cervical: No cervical adenopathy.  Skin:    General: Skin is warm and dry.     Coloration: Skin is not pale.     Findings: No rash.     Comments: Small horizontal skin tears under pannus laterally /both sides  Right side - dry /scabbed Left side-one 1 cm tear still healing  No erythema or drainage  Non tender  No excoriation     Neurological:     Mental Status: She is alert.     Coordination: Coordination normal.     Deep Tendon Reflexes: Reflexes are normal and symmetric. Reflexes normal.  Psychiatric:        Attention and Perception: Attention normal.        Mood and Affect: Mood is depressed. Affect is tearful.        Speech: Speech normal.        Behavior: Behavior  normal.        Thought Content: Thought content is not paranoid or delusional. Thought content does not include suicidal ideation. Thought content does not include suicidal plan.        Cognition and Memory: Cognition and memory normal.     Comments: Candidly discusses symptoms and stressors   Tearful              Assessment & Plan:   Problem List Items Addressed This Visit       Endocrine   Diabetic polyneuropathy associated with type 2 diabetes mellitus (HCC)   Plan to take over gabapenting from podiatry  Pt takes for this and also hot flashes 100 mg with bkfast 300 mg at night    Refilled 100 mg today  300 mg will be due in later may and pt will call here to transfer it to me  Has been helpful       Relevant Medications   gabapentin  (NEURONTIN ) 100 MG capsule     Musculoskeletal and Integument   Nontraumatic tear of skin - Primary   Under pannus - on both left and right  Today no redness or signs of bacterial or fungal infection  Caused by friction  Pt is unable to tolerate compression shorts or other garments to separate skin folds  Considering plastic surgery when she reaches goal weight  (may end up having to go to [facility] or [facility]  For now -keep clean and as dry as possible  Barrier cream or choice or aquaphor to protect   Call back and Er precautions noted in detail today        Candidal intertrigo   None today on exam  Encouraged to keep skin folds as clean and dry as possible         Other   Morbid obesity (HCC)   Continues to loose weight with glp-1  Encouraged to talk to her psychiatrist about binge eating behavior       Chronic depression   Continues  Pristiq 50 mg daily  Fluoxetine 20 mg  daily  Xanax at bedtime   Takes gabapentin  for other dx Sees dr Dearl Fabry afford visits as often as she needs/ has looked for psychiatrist that takes her medicare and unable so far

## 2023-06-04 NOTE — Patient Instructions (Addendum)
 Your wounds look ok today- no signs of infection (fungal or bacterial)  From the trauma of skin folds rubbing together Keep areas very clean with soap and water and dry well Continue barrier cream to protect and help heal  Aquaphor is also a good option if you have not tried it before   Watch for redness and drainage  Let us  know   I will refill the 100 mg gabapentin  today  Reach out to us  when due for the 300 mg pills later in may   Follow up with Dr Deborra Falter as needed if depression worsens further   Take care of yourself

## 2023-06-04 NOTE — Assessment & Plan Note (Signed)
 None today on exam  Encouraged to keep skin folds as clean and dry as possible

## 2023-06-07 ENCOUNTER — Ambulatory Visit: Admitting: Family Medicine

## 2023-06-12 ENCOUNTER — Other Ambulatory Visit: Payer: Self-pay | Admitting: Physician Assistant

## 2023-06-16 DIAGNOSIS — E119 Type 2 diabetes mellitus without complications: Secondary | ICD-10-CM | POA: Diagnosis not present

## 2023-06-18 ENCOUNTER — Ambulatory Visit: Payer: Medicare PPO | Admitting: Dermatology

## 2023-06-18 DIAGNOSIS — L649 Androgenic alopecia, unspecified: Secondary | ICD-10-CM | POA: Diagnosis not present

## 2023-06-18 DIAGNOSIS — Z79899 Other long term (current) drug therapy: Secondary | ICD-10-CM | POA: Diagnosis not present

## 2023-06-18 DIAGNOSIS — G5712 Meralgia paresthetica, left lower limb: Secondary | ICD-10-CM

## 2023-06-18 MED ORDER — MOMETASONE FUROATE 0.1 % EX CREA: TOPICAL_CREAM | CUTANEOUS | 1 refills | Status: AC

## 2023-06-18 MED ORDER — MINOXIDIL 2.5 MG PO TABS: 2.5000 mg | ORAL_TABLET | Freq: Every day | ORAL | 1 refills | Status: DC

## 2023-06-18 NOTE — Patient Instructions (Signed)

## 2023-06-18 NOTE — Progress Notes (Signed)
 Follow-Up Visit   Subjective  Mackenzie Bonilla is a 58 y.o. female who presents for the following: Androgenetic Alopecia of the scalp. She is on minoxidil  2.5 mg tablet and spironolactone  100 mg daily. Patient's hairdresser thinks hair is thicker.   She has a sensation on the left upper thigh x 1 month, with recent pain and warmth.   The following portions of the chart were reviewed this encounter and updated as appropriate: medications, allergies, medical history.  Review of Systems:  No other skin or systemic complaints except as noted in HPI or Assessment and Plan.  Objective  Well appearing patient in no apparent distress; mood and affect are within normal limits.  A focused examination was performed of the following areas: Face, scalp, left thigh  Relevant physical exam findings are noted in the Assessment and Plan.    Assessment & Plan  ANDROGENETIC ALOPECIA (FEMALE PATTERN HAIR LOSS) Exam: Diffuse thinning of the crown with narrowing of the midline part. Improved compared to photos 11/07/2021.   Chronic and persistent condition with duration or expected duration over one year. Condition is improving with treatment but not currently at goal.   Female Androgenic Alopecia is a chronic condition related to genetics and/or hormonal changes.  In women androgenetic alopecia is commonly associated with menopause but may occur any time after puberty.  It causes hair thinning primarily on the crown with widening of the part and temporal hairline recession.  Can use OTC Rogaine  (minoxidil ) 5% solution/foam as directed.  Oral treatments in female patients who have no contraindication may include : - Low dose oral minoxidil  1.25 - 5mg  daily - Spironolactone  50 - 100mg  bid - Finasteride 2.5 - 5 mg daily Adjunctive therapies include: - Low Level Laser Light Therapy (LLLT) - Platelet-rich plasma injections (PRP) - Hair Transplants or scalp reduction   Treatment Plan: Recent BP  reading with PCP normal. Continue Minoxidil  2.5 MG 1 tablet daily. Continue Spironolactone  100 MG 1 tablet daily as prescribed by PCP.   Doses of minoxidil  for hair loss are considered 'low dose'. This is because the doses used for hair loss are much lower than the doses which are used for conditions such as high blood pressure (hypertension). The doses used for hypertension are 10-40mg  per day.  Side effects are uncommon at the low doses (up to 2.5 mg/day) used to treat hair loss. Potential side effects, more commonly seen at higher doses, include: Increase in hair growth (hypertrichosis) elsewhere on face and body Temporary hair shedding upon starting medication which may last up to 4 weeks Ankle swelling, fluid retention, rapid weight gain more than 5 pounds Low blood pressure and feeling lightheaded or dizzy when standing up quickly Fast or irregular heartbeat Headaches   Long term medication management.  Patient is using long term (months to years) prescription medication  to control their dermatologic condition.  These medications require periodic monitoring to evaluate for efficacy and side effects and may require periodic laboratory monitoring.  ANDROGENETIC ALOPECIA   Related Medications minoxidil  (LONITEN ) 2.5 MG tablet Take 1 tablet (2.5 mg total) by mouth daily. MERALGIA PARESTHETICA Exam: Pink patch with excoriations at left thigh.  Chronic condition without cure secondary to pinched nerve along spine causing itching or sensation changes in an area of skin. Chronic rubbing or scratching causes darkening of the skin.  OTC treatments which can help with itch include numbing creams like pramoxine or lidocaine  which temporarily reduce itch or Capsaicin-containing creams which cause a burning sensation  but which sometimes over time will reset the nerves to stop producing itch.  If you choose to use Capsaicin cream, it is recommended to use it 5 times daily for 1 week followed by 3  times daily for 3-6 weeks. You may have to continue using it long-term.  If not doing well with OTC options, could consider [facility] compounded prescription anti-itch cream with Amitriptyline 5% / Lidocaine  5% / Pramoxine 1% or Amitriptyline 5% / Gabapentin  10% / Lidocaine  5% Cream or other prescription cream or pill options.   Start mometasone  cream apply to aa left thigh twice daily until improved dsp 45g 1Rf. Avoid applying to face, groin, and axilla. Use as directed. Long-term use can cause thinning of the skin.   Return in about 6 months (around 12/19/2023) for Alopecia.  IBernardine Bridegroom, CMA, am acting as scribe for Artemio Larry, MD .   Documentation: I have reviewed the above documentation for accuracy and completeness, and I agree with the above.  Artemio Larry, MD

## 2023-06-21 DIAGNOSIS — E1129 Type 2 diabetes mellitus with other diabetic kidney complication: Secondary | ICD-10-CM | POA: Diagnosis not present

## 2023-06-21 DIAGNOSIS — R809 Proteinuria, unspecified: Secondary | ICD-10-CM | POA: Diagnosis not present

## 2023-06-21 DIAGNOSIS — Z794 Long term (current) use of insulin: Secondary | ICD-10-CM | POA: Diagnosis not present

## 2023-06-21 LAB — COMPREHENSIVE METABOLIC PANEL WITH GFR: eGFR: 66

## 2023-06-27 DIAGNOSIS — E66813 Obesity, class 3: Secondary | ICD-10-CM | POA: Diagnosis not present

## 2023-06-27 DIAGNOSIS — R809 Proteinuria, unspecified: Secondary | ICD-10-CM | POA: Diagnosis not present

## 2023-06-27 DIAGNOSIS — K5901 Slow transit constipation: Secondary | ICD-10-CM | POA: Diagnosis not present

## 2023-06-27 DIAGNOSIS — Z794 Long term (current) use of insulin: Secondary | ICD-10-CM | POA: Diagnosis not present

## 2023-06-27 DIAGNOSIS — E1142 Type 2 diabetes mellitus with diabetic polyneuropathy: Secondary | ICD-10-CM | POA: Diagnosis not present

## 2023-06-27 DIAGNOSIS — E1129 Type 2 diabetes mellitus with other diabetic kidney complication: Secondary | ICD-10-CM | POA: Diagnosis not present

## 2023-07-04 DIAGNOSIS — H902 Conductive hearing loss, unspecified: Secondary | ICD-10-CM | POA: Diagnosis not present

## 2023-07-04 DIAGNOSIS — H6123 Impacted cerumen, bilateral: Secondary | ICD-10-CM | POA: Diagnosis not present

## 2023-07-09 ENCOUNTER — Ambulatory Visit: Payer: Self-pay

## 2023-07-09 NOTE — Telephone Encounter (Signed)
 Chief Complaint: constipation Symptoms: hard painful stools, one week between bowel movements Frequency: more than one month Pertinent Negatives: Patient denies Hemorid/blood Disposition: [] ED /[] Urgent Care (no appt availability in office) / [x] Appointment(In office/virtual)/ []  [facility]/ [] Home Care/ [] Refused Recommended Disposition /[] [facility]/ []  Follow-up with PCP Additional Notes: has been having a problem with constipation for about one month, but it has become more difficult and painful over the weekend She has been manually dis impacting herself States that it feels like she is sitting on a rock when she sits in a chair . Patient takes colace twice per day and has recently added miralax until endocrinologist told her not to and to take benefiber or metamucil instead. She has not tried any fiber supplement yet.  Tries to drink 3 16 oz water bottles per day.  Per colonoscopy within the past year revealed internal hemorrhoids Copied From CRM [id number]. Reason for Triage: Patient having constipation- taking Miralax. She said it is very hard to have a bowel movement. Wanting to know if there is anything else that can help her with the constipation. It hurts when she tries to go to the bathroom   Reason for Disposition  Unable to have a bowel movement (BM) without manually removing stool (using finger to pull out stool or perform disimpaction)  Answer Assessment - Initial Assessment Questions 1. STOOL PATTERN OR FREQUENCY: "How often do you have a bowel movement (BM)?"  (Normal range: 3 times a day to every 3 days)  "When was your last BM?"       One week ago 2. STRAINING: "Do you have to strain to have a BM?"      yes 3. RECTAL PAIN: "Does your rectum hurt when the stool comes out?" If Yes, ask: "Do you have hemorrhoids? How bad is the pain?"  (Scale 1-10; or mild, moderate, severe)     yes 4. STOOL COMPOSITION: "Are the stools hard?"      yes 5. BLOOD ON  STOOLS: "Has there been any blood on the toilet tissue or on the surface of the BM?" If Yes, ask: "When was the last time?"     no 6. CHRONIC CONSTIPATION: "Is this a new problem for you?"  If No, ask: "How long have you had this problem?" (days, weeks, months)      About one month 7. CHANGES IN DIET OR HYDRATION: "Have there been any recent changes in your diet?" "How much fluids are you drinking on a daily basis?"  "How much have you had to drink today?"     no 8. MEDICINES: "Have you been taking any new medicines?" "Are you taking any narcotic pain medicines?" (e.g., Dilaudid , morphine, Percocet, Vicodin)     No, tylenol  only 9. LAXATIVES: "Have you been using any stool softeners, laxatives, or enemas?"  If Yes, ask "What, how often, and when was the last time?"     Miralax, has been instructed not to use, colace 10. ACTIVITY:  "How much walking do you do every day?"  "Has your activity level decreased in the past week?"        Up around the house most days, and walks around in stores 11. CAUSE: "What do you think is causing the constipation?"        unknown 12. OTHER SYMPTOMS: "Do you have any other symptoms?" (e.g., abdomen pain, bloating, fever, vomiting)       yes 13. MEDICAL HISTORY: "Do you have a history of hemorrhoids,  rectal fissures, or rectal surgery or rectal abscess?"         no  Protocols used: Constipation-A-AH

## 2023-07-09 NOTE — Telephone Encounter (Signed)
 Appointment scheduled 07/11/2023 at 11:40 am with Dr. Cherlyn Cornet.

## 2023-07-09 NOTE — Telephone Encounter (Signed)
Aware, will watch for correspondence Thanks

## 2023-07-11 ENCOUNTER — Encounter: Payer: Self-pay | Admitting: Family Medicine

## 2023-07-11 ENCOUNTER — Ambulatory Visit: Admitting: Family Medicine

## 2023-07-11 ENCOUNTER — Ambulatory Visit: Payer: Self-pay

## 2023-07-11 VITALS — BP 110/66 | HR 72 | Temp 97.6°F | Ht 64.0 in | Wt 235.4 lb

## 2023-07-11 DIAGNOSIS — E119 Type 2 diabetes mellitus without complications: Secondary | ICD-10-CM

## 2023-07-11 DIAGNOSIS — K5909 Other constipation: Secondary | ICD-10-CM

## 2023-07-11 MED ORDER — LINACLOTIDE 72 MCG PO CAPS: 72.0000 ug | ORAL_CAPSULE | Freq: Every day | ORAL | 11 refills | Status: DC

## 2023-07-11 NOTE — Assessment & Plan Note (Signed)
 Chronic, likely IBS C vs idiopathic constipation.  Likely worsened symptoms from Mounjaro although she states having a vacation from this did not help.  She has failed multiple OTC treatment including metamucil, benefiber, colace.   She finds herself limiting water somewhat given incontinence. She is fairly sedentary.   I encouraged him to increase water , add walking/exercise as tolerated daily. Will try trial of linzess low dose.. may need higher dose.  She is hesitant about additional meds so I also suggested increased dose of Miralax temporarily.  She has follow up next week with her PCP.

## 2023-07-11 NOTE — Telephone Encounter (Signed)
  Chief Complaint: constipation, visit follow up Symptoms: constipation, rectal pain, abdominal cramps Frequency: x 45 minutes Pertinent Negatives: Patient denies fever Disposition: [] ED /[] Urgent Care (no appt availability in office) / [] Appointment(In office/virtual)/ []  [facility]/ [] Home Care/ [] Refused Recommended Disposition /[] [facility]/ [x]  Follow-up with PCP Additional Notes: Patient states her last BM was on Sunday. She has not taken any of the medications or recommendations from Dr Mackenzie Bonilla.  Advised patient to follow MD's orders. She states she has applied Preparation H and it has not helped. Patient is requesting a call back from office. Advised if pain has worsened and is severe, no appointments at clinic and to go to urgent care or ED. Patient states she won't go to any providers she doesn't know. Called [facility] and informed staff member, Mackenzie Bonilla.  Copied from CRM [id number]. Topic: Clinical - Red Word Triage >> Jul 11, 2023  2:09 PM Mackenzie Bonilla wrote: Red Word that prompted transfer to Nurse Triage: serve constipation she has really bad pain Reason for Disposition  [1] Recent medical visit within 24 hours AND [2] condition / symptoms WORSE  Answer Assessment - Initial Assessment Questions 1. MAIN CONCERN OR SYMPTOM:  "What is your main concern right now?" "What question do you have?" "What's the main symptom you're worried about?" (e.g., breathing difficulty, cough, fever. pain)     She states she left the office and went to the pharmacy to pick up prescriptions and c/o rectal pain and abdominal cramps.  2. ONSET: "When did the  pain  start?"     She states she has been having pain for months, and today's episode started around 45 minutes ago.  3. BETTER-SAME-WORSE: "Are you getting better, staying the same, or getting worse compared to how you felt at your last visit to the doctor (most recent medical visit)?"     Worsened.  4. VISIT DATE: "When were you seen?"  (Date)     Today.  5. VISIT DOCTOR: "What is the name of the doctor taking care of you now?"     Dr Mackenzie Bonilla.  6. VISIT DIAGNOSIS:  "What was the main symptom or problem that you were seen for?" "Were you given a diagnosis?"      Chronic constipation.  7. VISIT MEDICINES: "Did the doctor order any new medicines for you to use?" If Yes, ask: "Have you filled the prescription and started taking the medicine?"      Linzess, has not started the medication.  8. NEXT APPOINTMENT: "Have you scheduled a follow-up appointment with your doctor?"     07/17/23.  9. PAIN: "Is there any pain?" If Yes, ask: "How bad is it?"  (Scale 0-10; or mild, moderate, severe)    - NONE (0): no pain    - MILD (1-3): doesn't interfere with normal activities     - MODERATE (4-7): interferes with normal activities or awakens from sleep     - SEVERE (8-10): excruciating pain, unable to do any normal activities     9/10.  10. FEVER: "Do you have a fever?" If Yes, ask: "What is it, how was it measured  and when did it start?"       No.  11. OTHER SYMPTOMS: "Do you have any other symptoms?"       No.  Protocols used: Recent Medical Visit for Illness Follow-up Call-A-AH

## 2023-07-11 NOTE — Progress Notes (Signed)
 Patient ID: Mackenzie Bonilla, female    DOB: [birth year], 58 y.o.   MRN: [id number]  This visit was conducted in person.  BP 110/66 (BP Location: Left Arm, Patient Position: Sitting, Cuff Size: Large)   Pulse 72   Temp 97.6 F (36.4 C) (Oral)   Ht 5\' 4"  (1.626 m)   Wt 235 lb 6 oz (106.8 kg)   SpO2 98%   BMI 40.40 kg/m    CC:  Chief Complaint  Patient presents with   Constipation   Abdominal Pain    Subjective:   HPI: Mackenzie Bonilla is a 58 y.o. female presenting on 07/11/2023 for Constipation and Abdominal Pain Patient of Dr. Laverna Pott with complicated past medical history.  She has had significant weight loss and diet good diabetes control on Mounjaro 12.5 mg daily.  She has history of chronic constipation and reports she has tried Benefiber, Metamucil, MiraLAX 2 capfuls daily and increased water to treat.  She states in the last week her symptoms have been worse.  She had very firm feeling at rectum and passed a very large hard stool 4-5 days ago.  This relieved a lot of the rectal pressure and pain.  She has not had a bowel movement since that time and is starting to feel again more bloated and pressure in her abdomen.  She feels that this is a typical cycle where she is going to get constipated again and wants to know how she should treat this to avoid getting to the point where she has very hard stool and rectal pain.  She has a very restricted diet given other health problems and is tearful regarding this.  She reports she has gotten conflicting information about MiraLAX use from her endocrinologist and pharmacist.  No blood in her stool.   Hx of colonoscopy 10/2022: internal hemorrhoids       Relevant past medical, surgical, family and social history reviewed and updated as indicated. Interim medical history since our last visit reviewed. Allergies and medications reviewed and updated. Outpatient Medications Prior to Visit  Medication Sig Dispense Refill    acetaminophen  (TYLENOL ) 500 MG tablet Take 500 mg by mouth every 6 (six) hours as needed.      albuterol  (VENTOLIN  HFA) 108 (90 Base) MCG/ACT inhaler INHALE 2 PUFFS INTO THE LUNGS EVERY 6 HOURS AS NEEDED FOR WHEEZING. 18 g 3   ALPRAZolam (XANAX) 1 MG tablet Take 2 mg by mouth at bedtime.     ammonium lactate  (AMLACTIN) 12 % cream Apply 1 Application topically as needed for dry skin. 385 g 5   BD PEN NEEDLE NANO U/F 32G X 4 MM MISC daily.     Blood Glucose Monitoring Suppl (FIFTY50 GLUCOSE METER 2.0) w/Device KIT Use as directed     cyclobenzaprine  (FLEXERIL ) 5 MG tablet TAKE 1 TABLET BY MOUTH 3 TIMES DAILY AS NEEDED FOR MUSCLE SPASMS 90 tablet 0   desvenlafaxine (PRISTIQ) 50 MG 24 hr tablet Take 50 mg by mouth daily.     dimenhyDRINATE (DRAMAMINE) 50 MG tablet Take 50 mg by mouth every 8 (eight) hours as needed.     docusate sodium  (COLACE) 100 MG capsule Take 100 mg by mouth 2 (two) times daily.     FLUoxetine (PROZAC) 20 MG capsule Take 20 mg by mouth daily.     fluticasone  (FLONASE ) 50 MCG/ACT nasal spray Place into both nostrils daily.     gabapentin  (NEURONTIN ) 100 MG capsule Take 1 capsule (100 mg total) by  mouth daily. With breakfast 90 capsule 1   gabapentin  (NEURONTIN ) 300 MG capsule Take 300 mg by mouth daily.     ketoconazole  (NIZORAL ) 2 % cream Apply 1 Application topically daily as needed for irritation. 30 g 0   losartan (COZAAR) 25 MG tablet      minoxidil  (LONITEN ) 2.5 MG tablet Take 1 tablet (2.5 mg total) by mouth daily. 90 tablet 1   mometasone  (ELOCON ) 0.1 % cream Apply to affected area left thigh twice daily as needed for flares/itch. Avoid applying to face, groin, and axilla. Use as directed. Long-term use can cause thinning of the skin. 45 g 1   MOUNJARO 12.5 MG/0.5ML Pen Inject 12.5 mg into the skin once a week.     oxybutynin  (DITROPAN  XL) 15 MG 24 hr tablet TAKE 1 TABLET BY MOUTH DAILY 90 tablet 1   Polyethyl Glycol-Propyl Glycol (SYSTANE OP) Apply 1 drop to eye daily as  needed.     pravastatin  (PRAVACHOL ) 20 MG tablet TAKE 1 TABLET BY MOUTH AT BEDTIME 90 tablet 0   Simethicone (GAS-X PO) Take by mouth as needed.     spironolactone  (ALDACTONE ) 100 MG tablet TAKE 1 TABLET BY MOUTH DAILY 90 tablet 1   traMADol  (ULTRAM ) 50 MG tablet Take 1 tablet (50 mg total) by mouth every 12 (twelve) hours as needed for severe pain (pain score 7-10). Caution of sedation 30 tablet 0   traZODone (DESYREL) 150 MG tablet Take 300 mg by mouth at bedtime.     TRESIBA FLEXTOUCH 100 UNIT/ML FlexTouch Pen Inject 36 Units into the skin daily.     zinc oxide (BALMEX) 11.3 % CREA cream Apply 1 application topically 2 (two) times daily.     XEOMIN 50 units SOLR injection Inject 50 Units into the muscle every 3 (three) months.     No facility-administered medications prior to visit.     Per HPI unless specifically indicated in ROS section below Review of Systems  Constitutional:  Negative for fatigue and fever.  HENT:  Negative for congestion.   Eyes:  Negative for pain.  Respiratory:  Negative for cough and shortness of breath.   Cardiovascular:  Negative for chest pain, palpitations and leg swelling.  Gastrointestinal:  Negative for abdominal pain.  Genitourinary:  Negative for dysuria and vaginal bleeding.  Musculoskeletal:  Negative for back pain.  Neurological:  Negative for syncope, light-headedness and headaches.  Psychiatric/Behavioral:  Negative for dysphoric mood.    Objective:  BP 110/66 (BP Location: Left Arm, Patient Position: Sitting, Cuff Size: Large)   Pulse 72   Temp 97.6 F (36.4 C) (Oral)   Ht 5\' 4"  (1.626 m)   Wt 235 lb 6 oz (106.8 kg)   SpO2 98%   BMI 40.40 kg/m   Wt Readings from Last 3 Encounters:  07/11/23 235 lb 6 oz (106.8 kg)  06/04/23 240 lb (108.9 kg)  05/02/23 244 lb (110.7 kg)      Physical Exam Constitutional:      General: She is not in acute distress.    Appearance: Normal appearance. She is well-developed. She is obese. She is not  ill-appearing or toxic-appearing.  HENT:     Head: Normocephalic.     Right Ear: Hearing, tympanic membrane, ear canal and external ear normal. Tympanic membrane is not erythematous, retracted or bulging.     Left Ear: Hearing, tympanic membrane, ear canal and external ear normal. Tympanic membrane is not erythematous, retracted or bulging.     Nose: No  mucosal edema or rhinorrhea.     Right Sinus: No maxillary sinus tenderness or frontal sinus tenderness.     Left Sinus: No maxillary sinus tenderness or frontal sinus tenderness.     Mouth/Throat:     Pharynx: Uvula midline.  Eyes:     General: Lids are normal. Lids are everted, no foreign bodies appreciated.     Conjunctiva/sclera: Conjunctivae normal.     Pupils: Pupils are equal, round, and reactive to light.  Neck:     Thyroid : No thyroid  mass or thyromegaly.     Vascular: No carotid bruit.     Trachea: Trachea normal.  Cardiovascular:     Rate and Rhythm: Normal rate and regular rhythm.     Pulses: Normal pulses.     Heart sounds: Normal heart sounds, S1 normal and S2 normal. No murmur heard.    No friction rub. No gallop.  Pulmonary:     Effort: Pulmonary effort is normal. No tachypnea or respiratory distress.     Breath sounds: Normal breath sounds. No decreased breath sounds, wheezing, rhonchi or rales.  Abdominal:     General: Bowel sounds are normal.     Palpations: Abdomen is soft.     Tenderness: There is generalized abdominal tenderness.     Hernia: No hernia is present.     Comments: Large pannus  Musculoskeletal:     Cervical back: Normal range of motion and neck supple.  Skin:    General: Skin is warm and dry.     Findings: No rash.  Neurological:     Mental Status: She is alert.  Psychiatric:        Mood and Affect: Mood is not anxious or depressed. Affect is tearful. Affect is not flat.        Speech: Speech normal.        Behavior: Behavior normal. Behavior is cooperative.        Thought Content: Thought  content normal.        Judgment: Judgment normal.       Results for orders placed or performed in visit on 04/02/23  [facility] DIABETES EYE EXAM   Collection Time: 01/31/23 10:59 AM  Result Value Ref Range   [facility] Diabetic Eye Exam No Retinopathy No Retinopathy   *Note: Due to a large number of results and/or encounters for the requested time period, some results have not been displayed. A complete set of results can be found in Results Review.    Assessment and Plan  Chronic constipation Assessment & Plan: Chronic, likely IBS C vs idiopathic constipation.  Likely worsened symptoms from Mounjaro although she states having a vacation from this did not help.  She has failed multiple OTC treatment including metamucil, benefiber, colace.   She finds herself limiting water somewhat given incontinence. She is fairly sedentary.   I encouraged him to increase water , add walking/exercise as tolerated daily. Will try trial of linzess low dose.. may need higher dose.  She is hesitant about additional meds so I also suggested increased dose of Miralax temporarily.  She has follow up next week with her PCP.   Other orders -     linaCLOtide; Take 1 capsule (72 mcg total) by mouth daily before breakfast.  Dispense: 30 capsule; Refill: 11    No follow-ups on file.   Herby Lolling, MD

## 2023-07-11 NOTE — Assessment & Plan Note (Signed)
 Pt states lower dose of Mounjaro not effective on appetite.  Holding Mounjaro in past did not help constipation per pt.

## 2023-07-12 NOTE — Telephone Encounter (Addendum)
 Patti notified as instructed by telephone.  She states she did have a BM this morning after taking the Linzess and Miralax.  She states the BM was soft and the pain has eased.  She states she did have to push when having the BM.  Right now her stomach is gurgling/cramping and she is afraid to leave the house.   She will just wait to see how she does over the weekend and ask that we call and check on her next week.  She will call back on Monday if things don't continue to improve over the weekend.

## 2023-07-12 NOTE — Telephone Encounter (Signed)
 Copied from CRM [id number]. Topic: General - Other >> Jul 11, 2023  4:22 PM [place] E wrote: Reason for CRM: Patient called in requesting a callback from PCP or nurse in regards to previous triage note. Callback number for patient is [phone].

## 2023-07-12 NOTE — Telephone Encounter (Signed)
 Mackenzie Bonilla notified as instructed by telephone.  She asked what she can do for the pain until the medications start working.  I advised that if she is that much pain,  she should go to the ER.  Did not want to go to the ER and have to wait hours.   She just took the Linzess this morning.. I advised her to go ahead and do the Miralax as well.  I also told her to do the Fleet Enema to help break up the stool. She states Dr. Cherlyn Cornet offered to disimpact her yesterday but she declined.  No available appointment at [facility] today.  Please advise.

## 2023-07-12 NOTE — Telephone Encounter (Signed)
 Can use tylenol  extra strength three time daily as needed for pain... I can work in patient at 4:20 ( come earlier)  in  for a disimpaction attempt later today if rectal pain not improving/no BM.

## 2023-07-12 NOTE — Telephone Encounter (Signed)
 Aware , thanks  Follow up when needed Glad for some improvement

## 2023-07-12 NOTE — Telephone Encounter (Signed)
 Noted. FYI to PCP

## 2023-07-15 ENCOUNTER — Other Ambulatory Visit: Payer: Self-pay | Admitting: Physician Assistant

## 2023-07-16 ENCOUNTER — Other Ambulatory Visit: Payer: Self-pay | Admitting: Family Medicine

## 2023-07-16 MED ORDER — OMEPRAZOLE 40 MG PO CPDR: 40.0000 mg | DELAYED_RELEASE_CAPSULE | Freq: Every day | ORAL | 2 refills | Status: DC

## 2023-07-16 NOTE — Telephone Encounter (Signed)
 Patient reports that omeprazole  was previously ordered by her GI MD who she no longer sees, but would like for Dr. Malissa Se to order the omeprazole .

## 2023-07-16 NOTE — Telephone Encounter (Signed)
 Copied from CRM [id number]. Topic: Clinical - Medication Refill >> Jul 16, 2023 10:01 AM Allyne Areola wrote: Medication: Omeprazole  40 mg  Has the patient contacted their pharmacy? Yes, asked patient to contact ordering provider; however, her previous GI was original provider but she not longer sees her and would like Dr.Tower to order it.  (Agent: If no, request that the patient contact the pharmacy for the refill. If patient does not wish to contact the pharmacy document the reason why and proceed with request.) (Agent: If yes, when and what did the pharmacy advise?)  This is the patient's preferred pharmacy:  [facility] - [place], [place] - [address] [place] [address] Phone: [phone] Fax: [phone]  Is this the correct pharmacy for this prescription? Yes If no, delete pharmacy and type the correct one.   Has the prescription been filled recently? No  Is the patient out of the medication? Yes  Has the patient been seen for an appointment in the last year OR does the patient have an upcoming appointment? Yes  Can we respond through MyChart? Yes  Agent: Please be advised that Rx refills may take up to 3 business days. We ask that you follow-up with your pharmacy.

## 2023-07-17 ENCOUNTER — Ambulatory Visit: Admitting: Family Medicine

## 2023-07-17 ENCOUNTER — Encounter: Payer: Self-pay | Admitting: Family Medicine

## 2023-07-17 VITALS — BP 92/68 | HR 83 | Temp 98.6°F | Ht 64.0 in | Wt 234.6 lb

## 2023-07-17 DIAGNOSIS — E119 Type 2 diabetes mellitus without complications: Secondary | ICD-10-CM | POA: Diagnosis not present

## 2023-07-17 DIAGNOSIS — K219 Gastro-esophageal reflux disease without esophagitis: Secondary | ICD-10-CM | POA: Diagnosis not present

## 2023-07-17 DIAGNOSIS — K5909 Other constipation: Secondary | ICD-10-CM | POA: Diagnosis not present

## 2023-07-17 DIAGNOSIS — G5712 Meralgia paresthetica, left lower limb: Secondary | ICD-10-CM | POA: Diagnosis not present

## 2023-07-17 MED ORDER — HYDROCORTISONE (PERIANAL) 2.5 % EX CREA: TOPICAL_CREAM | CUTANEOUS | 0 refills | Status: AC

## 2023-07-17 MED ORDER — OMEPRAZOLE 40 MG PO CPDR: 40.0000 mg | DELAYED_RELEASE_CAPSULE | Freq: Two times a day (BID) | ORAL | 2 refills | Status: AC

## 2023-07-17 NOTE — Patient Instructions (Addendum)
 Continue the linzess   If you think you need to go up on the dose after this prescription let us  know  Keep up fluids Try and get fiber in your diet   If you need miralax -that is ok as well    When the meralgia paresthetica bothers you at night -try changing position to see if you find one that is more comfortable Try a pillow under the abdomen on the left side   Bio freeze or capsaicin cream over the counter may help   Try the anusol hc cream for affected/bothersome hemorrhoid area - you can use it at bedtime for no more than 10 days at a time (as needed)

## 2023-07-17 NOTE — Progress Notes (Signed)
 Subjective:    Patient ID: Mackenzie Bonilla, female    DOB: 1965/11/22, 58 y.o.   MRN: [id number]  HPI  Wt Readings from Last 3 Encounters:  07/17/23 234 lb 9.6 oz (106.4 kg)  07/11/23 235 lb 6 oz (106.8 kg)  06/04/23 240 lb (108.9 kg)   40.27 kg/m  Vitals:   07/17/23 1350  BP: 92/68  Pulse: 83  Temp: 98.6 F (37 C)  SpO2: 97%   Pt presents with c/o  Leg pain /burning in thigh left side  Constipation  GERD-needs omeprazole  refill   Was diagnosed with meralgia paresthetica from dermatology  Causing itching/discomfort  of skin on thigh  Capsaicin cream was discussed  Was prescription mometasone  cream to sue prn to calm area   Has a large pannus   Internal hemorrhoids are bothering her in setting of constipation   Saw Dr Duwayne Ginsberg for constipation/not reviewed  Worsened in setting of mounaro  Noted failed over the counter treatment including fiber and colace and fluids Was prescription linzess  72 mg    Using miralax (was able to cut back)  Taking linzess   Some bm occurring - not emptying all the way Not having to push as much   Stomach still bloats after eating  Lot of gurgling and some cramping (low)    Mental health update (Dr Jorja Newport)- wonders about ADHD  Mind is racing  Trouble concentrating  Trying to be more active  Misplaces things  Overall very tired and gets overwhelmed  Not sleeping very well - also some loud noise at her apt complex wakes her up early    Mounjaro -is on 12.5 mg weekly at this time     Patient Active Problem List   Diagnosis Date Noted   Meralgia paraesthetica, left 07/17/2023   Steroid side effects 12/28/2022   Dysphagia 11/01/2022   Gastric polyps 11/01/2022   Nontraumatic tear of skin 09/05/2022   Adverse effect of proton pump inhibitor 07/18/2022   Colon cancer screening 07/18/2022   Nasal congestion 04/30/2022   Breast asymmetry 02/21/2022   Muscle twitch 02/21/2022   Abdominal pannus 02/21/2022   Recurrent UTI  12/21/2021   Paresthesia 11/15/2021   Ingrown nail of great toe of left foot 11/15/2021   Keratosis 08/31/2021   Hair loss 06/19/2021   Breast pain 12/20/2020   Encounter for screening mammogram for breast cancer 12/13/2020   Lumbar facet arthropathy 12/29/2019   Chronic bilateral thoracic back pain 12/08/2019   Lumbar degenerative disc disease 12/08/2019   Fibromyalgia 12/08/2019   Chronic pain syndrome 12/08/2019   Lumbar radicular pain (left S1) 12/08/2019   Mid back pain 02/11/2019   Multiple joint pain 11/12/2018   Gallstones 04/19/2017   Diabetic polyneuropathy associated with type 2 diabetes mellitus (HCC) 04/07/2017   Diabetic angiopathy (HCC) 04/07/2017   Vasomotor symptoms due to menopause 04/07/2017   Mobility impaired 11/12/2016   Urinary incontinence 12/02/2015   Candidal intertrigo 04/06/2015   Cystocele 04/06/2015   Chronic constipation 04/06/2015   Myofascial pain syndrome of lumbar spine 12/28/2014   Hypertriglyceridemia 10/14/2014   Abdominal pain 04/23/2012   Uric acid kidney stone 11/20/2011   HYPERHIDROSIS 11/07/2009   Menopausal symptoms 06/17/2009   Type 2 diabetes mellitus without complication, without long-term current use of insulin (HCC) 08/23/2008   Vitamin D  deficiency 05/07/2008   Vitamin B 12 deficiency 06/04/2007   CHRONIC FATIGUE SYNDROME 11/15/2006   DIVERTICULITIS, HX OF 11/15/2006   PCOS (polycystic ovarian syndrome) 10/16/2006   Hyperlipidemia associated  with type 2 diabetes mellitus (HCC) 10/16/2006   Morbid obesity (HCC) 10/16/2006   Generalized anxiety disorder 10/16/2006   PANIC DISORDER 10/16/2006   BULIMIA 10/16/2006   Chronic depression 10/16/2006   CARPAL TUNNEL SYNDROME 10/16/2006   Lymphedema 10/16/2006   Allergic rhinitis 10/16/2006   Mild intermittent asthma 10/16/2006   Temporomandibular joint disorder 10/16/2006   GERD 10/16/2006   Irritable bowel syndrome 10/16/2006   Osteoarthritis 10/16/2006   COUGH, CHRONIC  10/16/2006   Past Medical History:  Diagnosis Date   Allergy    allergic rhinitis   Anemia    Anxiety    Arthritis    osteoarthritis   Asthma    Claustrophobia    does not like oxygen mask covering face   Depression    Diabetes mellitus without complication (HCC)    Fatty liver 07/2008   abd. ultrasound - fatty liver ; slt dilated cbd (no stones ) 06/10// abd.  ultrasound normal on 04/2006   Fibromyalgia    GERD (gastroesophageal reflux disease)    EGD negative 06/2001// EGD erythematous mucosa, polyp 08/2008   High uric acid in 24 hour urine specimen    Hyperhidrosis    Hyperlipidemia    Kidney stones    Lymphedema    Obesity    PCOS (polycystic ovarian syndrome)    Tachycardia    TMJ (dislocation of temporomandibular joint)    Past Surgical History:  Procedure Laterality Date   BIOPSY  11/01/2022   Procedure: BIOPSY;  Surgeon: Marnee Sink, MD;  Location: [facility] ENDOSCOPY;  Service: Endoscopy;;   BREAST BIOPSY Right 2016   neg   BREAST LUMPECTOMY WITH RADIOACTIVE SEED LOCALIZATION Right 08/02/2014   Procedure: BREAST LUMPECTOMY WITH RADIOACTIVE SEED LOCALIZATION;  Surgeon: Enid Harry, MD;  Location: [facility] OR;  Service: General;  Laterality: Right;   COLONOSCOPY  08/12/2012   Completed by Dr. Willistine Hartigan, normal exam.    COLONOSCOPY WITH PROPOFOL  N/A 11/01/2022   Procedure: COLONOSCOPY WITH PROPOFOL ;  Surgeon: Marnee Sink, MD;  Location: [facility] ENDOSCOPY;  Service: Endoscopy;  Laterality: N/A;   ENDOMETRIAL BIOPSY  01/2004   ESOPHAGOGASTRODUODENOSCOPY     ESOPHAGOGASTRODUODENOSCOPY (EGD) WITH PROPOFOL  N/A 08/18/2019   Procedure: ESOPHAGOGASTRODUODENOSCOPY (EGD) WITH PROPOFOL ;  Surgeon: Marnee Sink, MD;  Location: [facility] ENDOSCOPY;  Service: Endoscopy;  Laterality: N/A;   ESOPHAGOGASTRODUODENOSCOPY (EGD) WITH PROPOFOL  N/A 11/01/2022   Procedure: ESOPHAGOGASTRODUODENOSCOPY (EGD) WITH PROPOFOL ;  Surgeon: Marnee Sink, MD;  Location: [facility] ENDOSCOPY;  Service: Endoscopy;  Laterality:  N/A;   FOOT SURGERY     SEPTOPLASTY     two times   TONSILLECTOMY     UTERINE FIBROID SURGERY  06/2005   ablation   uterine tumor  09/2001   VAGUS NERVE STIMULATOR INSERTION     Social History   Tobacco Use   Smoking status: Never   Smokeless tobacco: Never  Vaping Use   Vaping status: Never Used  Substance Use Topics   Alcohol use: No    Alcohol/week: 0.0 standard drinks of alcohol   Drug use: No   Family History  Problem Relation Age of Onset   Hypertension Father    Cancer Father        pancreatic cancer   Hypertension Sister    Heart disease Maternal Grandmother 2       MI   Diabetes Maternal Grandmother    Heart disease Paternal Grandmother 75       MI   Diabetes Paternal Grandmother    Breast cancer Neg Hx  Allergies  Allergen Reactions   Cephalexin    Codeine     rash   Duloxetine    Levaquin  [Levofloxacin ] Nausea Only   Lisinopril Cough   Metformin  And Related Diarrhea    GI side effects    Quetiapine Other (See Comments)   Sertraline Hcl     REACTION: vivid dreams   Triazolam     REACTION: ? reaction   Zaleplon    Zocor [Simvastatin - High Dose]     achey   Zolpidem Tartrate     hallucinations   Zyrtec [Cetirizine]     Sedation    Hydrocodone  Itching and Rash    REACTION: redness rash itching   Norelgestromin-Eth Estradiol      Patch didn't stick to skin   Current Outpatient Medications on File Prior to Visit  Medication Sig Dispense Refill   acetaminophen  (TYLENOL ) 500 MG tablet Take 500 mg by mouth every 6 (six) hours as needed.      albuterol  (VENTOLIN  HFA) 108 (90 Base) MCG/ACT inhaler INHALE 2 PUFFS INTO THE LUNGS EVERY 6 HOURS AS NEEDED FOR WHEEZING. 18 g 3   ALPRAZolam (XANAX) 1 MG tablet Take 2 mg by mouth at bedtime.     ammonium lactate  (AMLACTIN) 12 % cream Apply 1 Application topically as needed for dry skin. 385 g 5   BD PEN NEEDLE NANO U/F 32G X 4 MM MISC daily.     Blood Glucose Monitoring Suppl (FIFTY50 GLUCOSE METER  2.0) w/Device KIT Use as directed     cyclobenzaprine  (FLEXERIL ) 5 MG tablet TAKE 1 TABLET BY MOUTH 3 TIMES DAILY AS NEEDED FOR MUSCLE SPASMS 90 tablet 0   desvenlafaxine (PRISTIQ) 50 MG 24 hr tablet Take 50 mg by mouth daily.     dimenhyDRINATE (DRAMAMINE) 50 MG tablet Take 50 mg by mouth every 8 (eight) hours as needed.     docusate sodium  (COLACE) 100 MG capsule Take 100 mg by mouth 2 (two) times daily.     FLUoxetine (PROZAC) 20 MG capsule Take 20 mg by mouth daily.     fluticasone  (FLONASE ) 50 MCG/ACT nasal spray Place into both nostrils daily.     gabapentin  (NEURONTIN ) 100 MG capsule Take 1 capsule (100 mg total) by mouth daily. With breakfast 90 capsule 1   gabapentin  (NEURONTIN ) 300 MG capsule Take 300 mg by mouth daily.     ketoconazole  (NIZORAL ) 2 % cream Apply 1 Application topically daily as needed for irritation. 30 g 0   linaclotide  (LINZESS ) 72 MCG capsule Take 1 capsule (72 mcg total) by mouth daily before breakfast. 30 capsule 11   losartan (COZAAR) 25 MG tablet      minoxidil  (LONITEN ) 2.5 MG tablet Take 1 tablet (2.5 mg total) by mouth daily. 90 tablet 1   mometasone  (ELOCON ) 0.1 % cream Apply to affected area left thigh twice daily as needed for flares/itch. Avoid applying to face, groin, and axilla. Use as directed. Long-term use can cause thinning of the skin. 45 g 1   MOUNJARO 12.5 MG/0.5ML Pen Inject 12.5 mg into the skin once a week.     oxybutynin  (DITROPAN  XL) 15 MG 24 hr tablet TAKE 1 TABLET BY MOUTH DAILY 90 tablet 1   Polyethyl Glycol-Propyl Glycol (SYSTANE OP) Apply 1 drop to eye daily as needed.     pravastatin  (PRAVACHOL ) 20 MG tablet TAKE 1 TABLET BY MOUTH AT BEDTIME 90 tablet 0   Simethicone (GAS-X PO) Take by mouth as needed.  spironolactone  (ALDACTONE ) 100 MG tablet TAKE 1 TABLET BY MOUTH DAILY 90 tablet 1   traMADol  (ULTRAM ) 50 MG tablet Take 1 tablet (50 mg total) by mouth every 12 (twelve) hours as needed for severe pain (pain score 7-10). Caution of  sedation 30 tablet 0   traZODone (DESYREL) 150 MG tablet Take 300 mg by mouth at bedtime.     TRESIBA FLEXTOUCH 100 UNIT/ML FlexTouch Pen Inject 36 Units into the skin daily.     zinc oxide (BALMEX) 11.3 % CREA cream Apply 1 application topically 2 (two) times daily.     No current facility-administered medications on file prior to visit.    Review of Systems  Constitutional:  Negative for activity change, appetite change, fatigue, fever and unexpected weight change.  HENT:  Negative for congestion, ear pain, rhinorrhea, sinus pressure and sore throat.   Eyes:  Negative for pain, redness and visual disturbance.  Respiratory:  Negative for cough, shortness of breath and wheezing.   Cardiovascular:  Negative for chest pain and palpitations.  Gastrointestinal:  Positive for constipation. Negative for abdominal pain, blood in stool and diarrhea.  Endocrine: Negative for polydipsia and polyuria.  Genitourinary:  Negative for dysuria, frequency and urgency.  Musculoskeletal:  Negative for arthralgias, back pain and myalgias.  Skin:  Negative for pallor and rash.  Allergic/Immunologic: Negative for environmental allergies.  Neurological:  Positive for numbness. Negative for dizziness, syncope and headaches.       Tingling left upper lateral thigh   Hematological:  Negative for adenopathy. Does not bruise/bleed easily.  Psychiatric/Behavioral:  Positive for dysphoric mood. Negative for decreased concentration. The patient is nervous/anxious.        Objective:   Physical Exam Constitutional:      General: She is not in acute distress.    Appearance: Normal appearance. She is well-developed. She is obese. She is not ill-appearing or diaphoretic.  HENT:     Head: Normocephalic and atraumatic.  Eyes:     General:        Left eye: No discharge.     Conjunctiva/sclera: Conjunctivae normal.     Pupils: Pupils are equal, round, and reactive to light.  Neck:     Thyroid : No thyromegaly.      Vascular: No carotid bruit or JVD.  Cardiovascular:     Rate and Rhythm: Normal rate and regular rhythm.     Heart sounds: Normal heart sounds.     No gallop.  Pulmonary:     Effort: Pulmonary effort is normal. No respiratory distress.     Breath sounds: Normal breath sounds. No wheezing or rales.  Abdominal:     General: Abdomen is protuberant. Bowel sounds are normal. There is no distension or abdominal bruit.     Palpations: Abdomen is soft. There is no fluid wave, hepatomegaly, splenomegaly, mass or pulsatile mass.     Tenderness: There is abdominal tenderness in the right lower quadrant and left lower quadrant.     Comments: Mild low abd tenderness to deep palpation   Large pannus noted   Musculoskeletal:     Cervical back: Normal range of motion and neck supple.     Right lower leg: No edema.     Left lower leg: No edema.  Lymphadenopathy:     Cervical: No cervical adenopathy.  Skin:    General: Skin is warm and dry.     Coloration: Skin is not pale.     Findings: No rash.     Comments:  No rash or skin change on left lateral thigh   Skin under pannus is intact without tears or rashes   Neurological:     Mental Status: She is alert.     Coordination: Coordination normal.     Gait: Gait normal.     Deep Tendon Reflexes: Reflexes are normal and symmetric. Reflexes normal.  Psychiatric:        Mood and Affect: Mood normal.           Assessment & Plan:   Problem List Items Addressed This Visit       Digestive   GERD   Pt is back on omeprazole  40 mg bid  Unable to tolerate daily - has breakthrough symptoms  Mounjaro may be worsening acid reflux due to gi motility slow down       Relevant Medications   omeprazole  (PRILOSEC) 40 MG capsule   Chronic constipation - Primary   Worsened by mounjaro  Reviewed most recent note from Dr Cherlyn Cornet  Some improvement with linzess  so far  I suggest pt try using it daily at the same time while also increasing fluids and  eating high fiber foods Miralax is ok also if needed  Reassuring exam  Anusol hc cream sent for hemorrhoid irritation from previous straining   Pt will finish this prescription for linzess  and then let us  know if she thinks she wants to increase the dose   Call back and Er precautions noted in detail today          Nervous and Auditory   Meralgia paraesthetica, left   Pt has a chronically tingly /itchy area on left upper lateral thigh, due to above  More bothersome at night  Discussed re positioning to take weight of pannus off the pelvic girdle while lying down (can try a pillow or other support)   The mometasone  has helped itching/skin irritation (normal exam today)  Suggested bio freeze or capsacin topical in future to see if that helps   Hope this improves with further weight loss

## 2023-07-17 NOTE — Assessment & Plan Note (Signed)
 Worsened by [facility]  Reviewed most recent note from Dr Cherlyn Cornet  Some improvement with linzess  so far  I suggest pt try using it daily at the same time while also increasing fluids and eating high fiber foods Miralax is ok also if needed  Reassuring exam  Anusol hc cream sent for hemorrhoid irritation from previous straining   Pt will finish this prescription for linzess  and then let us  know if she thinks she wants to increase the dose   Call back and Er precautions noted in detail today

## 2023-07-17 NOTE — Assessment & Plan Note (Signed)
 Pt is back on omeprazole  40 mg bid  Unable to tolerate daily - has breakthrough symptoms  Mounjaro may be worsening acid reflux due to gi motility slow down

## 2023-07-17 NOTE — Assessment & Plan Note (Signed)
 Pt has a chronically tingly /itchy area on left upper lateral thigh, due to above  More bothersome at night  Discussed re positioning to take weight of pannus off the pelvic girdle while lying down (can try a pillow or other support)   The mometasone  has helped itching/skin irritation (normal exam today)  Suggested bio freeze or capsacin topical in future to see if that helps   Hope this improves with further weight loss

## 2023-07-23 ENCOUNTER — Encounter: Payer: Self-pay | Admitting: Podiatry

## 2023-07-23 ENCOUNTER — Ambulatory Visit (INDEPENDENT_AMBULATORY_CARE_PROVIDER_SITE_OTHER): Admitting: Podiatry

## 2023-07-23 VITALS — Ht 64.0 in | Wt 234.6 lb

## 2023-07-23 DIAGNOSIS — M79675 Pain in left toe(s): Secondary | ICD-10-CM | POA: Diagnosis not present

## 2023-07-23 DIAGNOSIS — M79674 Pain in right toe(s): Secondary | ICD-10-CM

## 2023-07-23 DIAGNOSIS — B351 Tinea unguium: Secondary | ICD-10-CM | POA: Diagnosis not present

## 2023-07-23 NOTE — Progress Notes (Signed)
 Chief Complaint  Patient presents with   Nail Problem    Pt is here for [facility].    SUBJECTIVE Patient presents to office today complaining of elongated, thickened nails that cause pain while ambulating in shoes.  Patient is unable to trim their own nails.  Patient is also been experiencing some pain and tenderness associated to left great toe joint.  Idiopathic onset ongoing for few months now.  Intermittent painful toe depending on activity.  She also says it is more painful when she elevates her foot.  Patient is here for further evaluation and treatment.   Past Medical History:  Diagnosis Date   Allergy    allergic rhinitis   Anemia    Anxiety    Arthritis    osteoarthritis   Asthma    Claustrophobia    does not like oxygen mask covering face   Depression    Diabetes mellitus without complication (HCC)    Fatty liver 07/2008   abd. ultrasound - fatty liver ; slt dilated cbd (no stones ) 06/10// abd.  ultrasound normal on 04/2006   Fibromyalgia    GERD (gastroesophageal reflux disease)    EGD negative 06/2001// EGD erythematous mucosa, polyp 08/2008   High uric acid in 24 hour urine specimen    Hyperhidrosis    Hyperlipidemia    Kidney stones    Lymphedema    Obesity    PCOS (polycystic ovarian syndrome)    Tachycardia    TMJ (dislocation of temporomandibular joint)     Allergies  Allergen Reactions   Cephalexin    Codeine     rash   Duloxetine    Levaquin  [Levofloxacin ] Nausea Only   Lisinopril Cough   Metformin  And Related Diarrhea    GI side effects    Quetiapine Other (See Comments)   Sertraline Hcl     REACTION: vivid dreams   Triazolam     REACTION: ? reaction   Zaleplon    Zocor [Simvastatin - High Dose]     achey   Zolpidem Tartrate     hallucinations   Zyrtec [Cetirizine]     Sedation    Hydrocodone  Itching and Rash    REACTION: redness rash itching   Norelgestromin-Eth Estradiol      Patch didn't stick to skin     OBJECTIVE General  Patient is awake, alert, and oriented x 3 and in no acute distress. Derm Skin is dry and supple bilateral. Negative open lesions or macerations. Remaining integument unremarkable. Nails are tender, long, thickened and dystrophic with subungual debris, consistent with onychomycosis, 1-5 bilateral. No signs of infection noted. Vasc  DP and PT pedal pulses palpable bilaterally. Temperature gradient within normal limits.  Neuro grossly intact via light touch Musculoskeletal Exam No symptomatic pedal deformities noted bilateral. Muscular strength within normal limits.  There are some pain and tenderness associated to the plantar aspect of the left great toe joint  ASSESSMENT 1.  Pain due to onychomycosis of toenails both 2.  Capsulitis left great toe  PLAN OF CARE -Patient evaluated today.  -Instructed to maintain good pedal hygiene and foot care.  -Mechanical debridement of nails 1-5 bilaterally performed using a nail nipper. Filed with dremel without incident.  - Silicone toe caps were provided to apply to the great toes to alleviate pressure from the bilateral great toes -Continue wearing good supportive [employer] tennis shoes -Return to clinic in 3 mos.    Dot Gazella, DPM [facility]  Dr. Dot Gazella, DPM    [address] [place], [place] [address]                Office [phone]  Fax [phone]

## 2023-07-24 DIAGNOSIS — M5431 Sciatica, right side: Secondary | ICD-10-CM | POA: Diagnosis not present

## 2023-07-24 DIAGNOSIS — M9904 Segmental and somatic dysfunction of sacral region: Secondary | ICD-10-CM | POA: Diagnosis not present

## 2023-07-24 DIAGNOSIS — M9905 Segmental and somatic dysfunction of pelvic region: Secondary | ICD-10-CM | POA: Diagnosis not present

## 2023-07-24 DIAGNOSIS — M9903 Segmental and somatic dysfunction of lumbar region: Secondary | ICD-10-CM | POA: Diagnosis not present

## 2023-07-25 ENCOUNTER — Telehealth: Payer: Self-pay | Admitting: Family Medicine

## 2023-07-25 MED ORDER — LINACLOTIDE 145 MCG PO CAPS: 145.0000 ug | ORAL_CAPSULE | Freq: Every day | ORAL | 0 refills | Status: DC

## 2023-07-25 NOTE — Telephone Encounter (Signed)
 I sent the next dose up   Let us  know if that does not help

## 2023-07-25 NOTE — Addendum Note (Signed)
 Addended by: Deri Fleet A on: 07/25/2023 04:56 PM   Modules accepted: Orders

## 2023-07-25 NOTE — Telephone Encounter (Signed)
 Copied from CRM [id number]. Topic: Clinical - Medication Question >> Jul 25, 2023  9:57 AM Mackenzie Bonilla wrote: Reason for CRM: Patient called in stating she Would like the next dosage up on medication linaclotide  (LINZESS ) 72 MCG capsule. please call [phone] once completed

## 2023-07-26 ENCOUNTER — Telehealth: Payer: Self-pay

## 2023-07-26 ENCOUNTER — Other Ambulatory Visit (HOSPITAL_COMMUNITY): Payer: Self-pay

## 2023-07-26 ENCOUNTER — Telehealth: Payer: Self-pay | Admitting: *Deleted

## 2023-07-26 NOTE — Telephone Encounter (Signed)
 Pharmacy Patient Advocate Encounter   Received notification from Physician's Office that prior authorization for Linzess  145 is required/requested.   Insurance verification completed.   The patient is insured through [employer] .   Per test claim: Refill too soon. PA is not needed at this time. Medication was filled 07/25/23. Next eligible fill date is 08/19/23.

## 2023-07-26 NOTE — Telephone Encounter (Signed)
Pt notified Rx sent to pharmacy and advise of Dr. Tower's comments  

## 2023-07-31 ENCOUNTER — Ambulatory Visit: Admitting: Dermatology

## 2023-07-31 ENCOUNTER — Other Ambulatory Visit: Payer: Self-pay | Admitting: Family Medicine

## 2023-08-02 ENCOUNTER — Telehealth: Payer: Self-pay | Admitting: *Deleted

## 2023-08-02 NOTE — Telephone Encounter (Signed)
 Called pt and told her letter ready for pick up, pt requested that I mail the letter to her. Letter mailed pt pt request

## 2023-08-02 NOTE — Telephone Encounter (Signed)
 Done and in IN box

## 2023-08-02 NOTE — Telephone Encounter (Signed)
 Copied from CRM [id number]. Topic: Clinical - Medical Advice >> Aug 02, 2023  1:48 PM Mackenzie Bonilla wrote: Reason for CRM:  Patient is calling in to see if Dr. Geralyn Knee or her PCP can write her a note for her apartment to get a new toilet that is higher. Patient states her knees get painful when having to squat down to use the toilet.

## 2023-08-02 NOTE — Telephone Encounter (Signed)
 Will route message to PCP and Dr. Geralyn Knee for review

## 2023-08-12 ENCOUNTER — Other Ambulatory Visit (HOSPITAL_COMMUNITY): Payer: Self-pay

## 2023-08-14 ENCOUNTER — Ambulatory Visit: Payer: Self-pay

## 2023-08-14 DIAGNOSIS — M7918 Myalgia, other site: Secondary | ICD-10-CM

## 2023-08-14 DIAGNOSIS — M5136 Other intervertebral disc degeneration, lumbar region with discogenic back pain only: Secondary | ICD-10-CM

## 2023-08-14 NOTE — Addendum Note (Signed)
 Addended by: RANDEEN HARDY A on: 08/14/2023 07:18 PM   Modules accepted: Orders

## 2023-08-14 NOTE — Telephone Encounter (Signed)
 FYI Only or Action Required?: Action required by provider: clinical question for provider.  Patient was last seen in primary care on 07/17/2023 by Randeen Laine LABOR, MD. Called Nurse Triage reporting Back Pain. Symptoms began about a month ago. Interventions attempted: OTC medications: ibuprofen , tylenol . Salon pas and Other: went to chiropractor, massage. Symptoms are: mid and lower back pain, constipation/straining, increased sleep (reports sleeping up to 12 hours a day), left upper thigh pain unchanged.  Triage Disposition: See PCP Within 2 Weeks  Patient/caregiver understands and will follow disposition?: No, wishes to speak with PCP                      Copied from CRM [id number]. Topic: Clinical - Red Word Triage >> Aug 14, 2023 11:29 AM Mackenzie Bonilla wrote: [facility] that prompted transfer to Nurse Triage: Patient was wondering if she needs an appointment with Dr. Randeen due to muscle straining on her side that has worsened and spread with pain. She states that the pain goes up her spine and she's also noticed irregular bowel movements. Reason for Disposition  Back pain present > 2 weeks  Answer Assessment - Initial Assessment Questions Offered appointment with available providers, patient declined. She would like PCP Dr. Randeen to review symptoms and address her questions: should Linzess  be increased, does she recommend any stretches for the back. Patient also wanted to add that when she takes the miralax she has diarrhea nad itshit or miss if she will have diarrhea or hard stool. She adds she is drinking water.  Patient states she is driving to [place] on 2/88 and would like to get her PCP's opinion.  1. ONSET: When did the pain begin?      X 1 month.  2. LOCATION: Where does it hurt? (upper, mid or lower back)     Mid and lower back.  3. SEVERITY: How bad is the pain?  (e.g., Scale 1-10; mild, moderate, or severe)   - MILD (1-3): Doesn't interfere with normal activities.     - MODERATE (4-7): Interferes with normal activities or awakens from sleep.    - SEVERE (8-10): Excruciating pain, unable to do any normal activities.      No pain at the moment,  she states moderate pain with movement.  4. PATTERN: Is the pain constant? (e.g., yes, no; constant, intermittent)      Intermittent, not present now at rest but comes with movement.  5. RADIATION: Does the pain shoot into your legs or somewhere else?     No.  6. CAUSE:  What do you think is causing the back pain?      She states the constipation and straining makes it worse. Patient states she sits a lot and it worsens when lifting heavy objects. She states it could be muscular.  7. BACK OVERUSE:  Any recent lifting of heavy objects, strenuous work or exercise?     She states she picked up a 24 pack of water and also yesterday she states she was reaching for her top shelf and felt that made it worse.  8. MEDICINES: What have you taken so far for the pain? (e.g., nothing, acetaminophen , NSAIDS)     Ibuprofen , Tylenol , Salon pas, massage and went to a chiropractor.  9. NEUROLOGIC SYMPTOMS: Do you have any weakness, numbness, or problems with bowel/bladder control?     No.  10. OTHER SYMPTOMS: Do you have any other symptoms? (e.g., fever, abdomen pain, burning with urination, blood  in urine)       Constipation/straining (last bowel movement today but states it was hard and small), chronic left upper thigh pain, increased sleep. Patient denies any fever.  11. PREGNANCY: Is there any chance you are pregnant? When was your last menstrual period?       N/A.  Protocols used: Back Pain-A-AH

## 2023-08-14 NOTE — Telephone Encounter (Signed)
 pt said she thinks its muscular and doesn't think it's anything major so doesn't think she needs to see ortho but is okay with a referral to PT. Pt would like to see someone in [place]. Pt advise if she hasn't received a call or letter within 2 weeks to let us  know.   Is pain mainly mid or lower back? Across lower back sometimes and sometimes it feels like it's going up her spine. Is it different than in the past ? It started in the same spot PCP eval a while ago on her side but now it's also in areas mentioned above. Any new neurologic symptoms ? Radiation of pain? Pt said pain is worse when getting up and down, picking something heavy up (like water bottles), or reaching to get something off the top shelf. She said the pain some times also happens when she is pushing to have BM.  ** Pt also wanted to let Dr. Randeen know since talking to triage nurse she has had diarrhea 4x ** pt said she is still having bowel issues she's taking all of the meds Dr. Randeen advised her to take. Sometimes she can go days without a BM and then do a small amount and then some days she may have diarrhea pt not sure what to do for the BM issues but wanted PCP to know they are still there. She also had a question regarding her linzess , pt said she was advised to take med 30 min before breakfast. Pt said some days she takes meds and eat breakfast 30 min later as instructed but sometimes pt takes meds and then starts doing chores or task and doesn't get to eat for a few hrs after taking med. Pt wanted to see if her waiting to eat later after taking med will affect the med. She wants to make sure it is still effective if she doesn't eat. **

## 2023-08-14 NOTE — Telephone Encounter (Signed)
 She has had mid and lower back pain in past and had ortho attention and injections I think -lumbar in past   Options include  PT if she has not done it in a while  Ortho referral   Is pain mainly mid or lower back?  Is it different than in the past ?  Any new neurologic symptoms ? Radiation of pain?   Thanks

## 2023-08-14 NOTE — Telephone Encounter (Signed)
 I put the referral in for PT Please let us  know if you don't hear in 1-2 weeks to set that up   Linzess  works better if taken right before eating  If she takes it without eating it may not work as well Thanks for letting me know

## 2023-08-15 NOTE — Telephone Encounter (Signed)
Pt notified of Dr. Tower's comments and verbalized understanding  

## 2023-08-16 DIAGNOSIS — E119 Type 2 diabetes mellitus without complications: Secondary | ICD-10-CM | POA: Diagnosis not present

## 2023-08-23 ENCOUNTER — Other Ambulatory Visit: Payer: Self-pay | Admitting: Family Medicine

## 2023-08-26 ENCOUNTER — Telehealth: Payer: Self-pay | Admitting: Family Medicine

## 2023-08-26 ENCOUNTER — Telehealth: Payer: Self-pay | Admitting: *Deleted

## 2023-08-26 MED ORDER — LINACLOTIDE 290 MCG PO CAPS: 290.0000 ug | ORAL_CAPSULE | Freq: Every day | ORAL | 0 refills | Status: DC

## 2023-08-26 NOTE — Telephone Encounter (Signed)
 Pt notified of Dr. Graham comments. She only had 7 pills of lower dose so new Rx for higher dose sent in to pharmacy. Pt will keep us  updated

## 2023-08-26 NOTE — Telephone Encounter (Signed)
 Copied from CRM [id number]. Topic: Clinical - Medication Question >> Aug 26, 2023 12:47 PM Viola F wrote: Reason for CRM: Patient wants to know if Dr. Randeen wants her to finish the Linaclotide  (LINZESS ) 145 MCG CAPS capsule? She has 7 pills left and wants to know what she should do? When she takes the medication she has no bowel movement and takes Miralax. Please call patient at [phone] (H) please leave detailed message if patient doesn't answer

## 2023-08-26 NOTE — Telephone Encounter (Signed)
 Copied from CRM [id number]. Topic: Referral - Status >> Aug 26, 2023 12:57 PM Viola F wrote: Reason for CRM: Patient called to follow up on referral to physical therapy, she wants to make sure that the referral was sent to [facility] on [address]

## 2023-08-26 NOTE — Telephone Encounter (Signed)
 There is a 290 mg dose if she wants to try more (that is the max dose)  If so - she can try taking 2 pills once daily for the next several days and let us  know if that helps or not  Thanks for letting me know

## 2023-08-29 ENCOUNTER — Encounter: Payer: Self-pay | Admitting: *Deleted

## 2023-08-29 NOTE — Telephone Encounter (Signed)
 I have updated the referral to reflect this location request. Referral faxed to [facility] Lynden Edin, PT)  Msg sent to patient making aware of where referral/order was sent. Pt made aware they can call and schedule appt.

## 2023-09-09 ENCOUNTER — Other Ambulatory Visit: Payer: Self-pay | Admitting: Family Medicine

## 2023-09-09 NOTE — Telephone Encounter (Signed)
 No prescription history listed on med list, please advise.

## 2023-09-09 NOTE — Telephone Encounter (Signed)
 Please ask pt if she is taking this / miralax or both  Who was last prescriber?

## 2023-09-10 NOTE — Telephone Encounter (Signed)
 Patient takes colace BID and has not taking miralax for a while. Patient stated the last prescriber on her bottle was from Dr. Randeen; patient had bottle in hand when I spoke with her.

## 2023-09-10 NOTE — Telephone Encounter (Signed)
 I sent it  Keep up fluid intake as well

## 2023-09-11 DIAGNOSIS — M5459 Other low back pain: Secondary | ICD-10-CM | POA: Diagnosis not present

## 2023-09-11 DIAGNOSIS — M546 Pain in thoracic spine: Secondary | ICD-10-CM | POA: Diagnosis not present

## 2023-09-16 DIAGNOSIS — E119 Type 2 diabetes mellitus without complications: Secondary | ICD-10-CM | POA: Diagnosis not present

## 2023-09-23 ENCOUNTER — Other Ambulatory Visit: Payer: Self-pay | Admitting: Family Medicine

## 2023-09-24 DIAGNOSIS — M546 Pain in thoracic spine: Secondary | ICD-10-CM | POA: Diagnosis not present

## 2023-09-24 DIAGNOSIS — M5459 Other low back pain: Secondary | ICD-10-CM | POA: Diagnosis not present

## 2023-09-26 DIAGNOSIS — H6123 Impacted cerumen, bilateral: Secondary | ICD-10-CM | POA: Diagnosis not present

## 2023-09-26 DIAGNOSIS — M546 Pain in thoracic spine: Secondary | ICD-10-CM | POA: Diagnosis not present

## 2023-09-26 DIAGNOSIS — H902 Conductive hearing loss, unspecified: Secondary | ICD-10-CM | POA: Diagnosis not present

## 2023-09-26 DIAGNOSIS — M5459 Other low back pain: Secondary | ICD-10-CM | POA: Diagnosis not present

## 2023-10-01 ENCOUNTER — Other Ambulatory Visit: Payer: Self-pay | Admitting: Family Medicine

## 2023-10-01 ENCOUNTER — Telehealth: Payer: Self-pay | Admitting: Family Medicine

## 2023-10-01 DIAGNOSIS — M546 Pain in thoracic spine: Secondary | ICD-10-CM | POA: Diagnosis not present

## 2023-10-01 DIAGNOSIS — M5459 Other low back pain: Secondary | ICD-10-CM | POA: Diagnosis not present

## 2023-10-01 NOTE — Telephone Encounter (Signed)
 Called pt and she asked before PCP referred her to a GI doc does she think it would be okay for her to take the lower dose 145mcg one day then the 290 mcg the next day alternating them.  Pt said the 145 mcg by it's self didn't help much but now she is going to much pt said she will go days without a BM but has the urge to go then when she finally does go it's watery to mash potato consistency. Pt said as soon as she gets the urge to go to the bathroom she has to go asap or she will have an accident. Which is causing her to stay in the house more. Pt has only had 1 solid BM and that was yesterday the rest have been diarrhea. Pt is wondering if she's not eating enough, she said she is trying to get protein in her diet but pharmacist told her she may not be eating enough

## 2023-10-01 NOTE — Telephone Encounter (Signed)
 Copied from CRM #[id number]. Topic: Clinical - Medical Advice >> Oct 01, 2023 10:13 AM Donna BRAVO wrote: Reason for CRM: patient has IBS and is taking linaclotide  (LINZESS ) 290 MCG CAPS capsule taken for 2 months patient has diarrhea, unable to hold in, happens with no warning. Has no rhyme or reason. Patient would like to know what she can do.   Patient phone number  [phone]  Patient was made aware their call will be returned in 1 business day.

## 2023-10-01 NOTE — Telephone Encounter (Signed)
 Did the previous dose (145) help at all?   If the 290 mg dose is too high than I do not recommend continuing it  Would it be an option to take as needed ?  Take it once or twice weekly to help get things moving?   Next step would be referral to GI  Interested in this ?   Thanks for letting me know

## 2023-10-01 NOTE — Telephone Encounter (Signed)
 She can certainly try the 145 mcg every other and 290 every other but insurance will likely not pay for both strength of pill so it would be taking 1 pill of the 145 , then 2 pills of the 145  Does she have any of the 145s left ?  Could also just use 290 by itself but not every single day   If neither of these things help I can refer to GI so let me know  Dietary wise-getting enough fiber and fluids is biggest concern Eating less will not necessarily affect this

## 2023-10-01 NOTE — Telephone Encounter (Signed)
 Does she need the 300s or the 100s or both ? This is for the 100 Message is for the 300?   I think she takes both - we were taking over the 300s from podiatry ? Please verify before I fill

## 2023-10-01 NOTE — Telephone Encounter (Unsigned)
 Copied from CRM [id number]. Topic: Clinical - Medication Refill >> Oct 01, 2023 10:22 AM Donna BRAVO wrote: Medication:  gabapentin  (NEURONTIN ) 300 MG capsule   Has the patient contacted their pharmacy? Yes Patient put in request to pharmacy on 09/30/23  This is the patient's preferred pharmacy:  [facility] - [place], [place] - [address] [place] [address] Phone: [phone] Fax: [phone]  Is this the correct pharmacy for this prescription? Yes If no, delete pharmacy and type the correct one.   Has the prescription been filled recently? Yes  Is the patient out of the medication? No  Has the patient been seen for an appointment in the last year OR does the patient have an upcoming appointment? Yes  Can we respond through MyChart? Yes  Agent: Please be advised that Rx refills may take up to 3 business days. We ask that you follow-up with your pharmacy.

## 2023-10-01 NOTE — Telephone Encounter (Signed)
 Pt had some of the 72 tabs left so she is going to try the 290 one day then take 2 of the 72 tabs the next day and see if that helps. Pt notified of Dr. Graham comments

## 2023-10-02 MED ORDER — GABAPENTIN 300 MG PO CAPS: 300.0000 mg | ORAL_CAPSULE | Freq: Every day | ORAL | 1 refills | Status: AC

## 2023-10-02 MED ORDER — LINACLOTIDE 145 MCG PO CAPS: 145.0000 ug | ORAL_CAPSULE | ORAL | 0 refills | Status: DC

## 2023-10-02 NOTE — Telephone Encounter (Signed)
 Pt said it's the 300 mg dose she takes at bedtime she wants refilled.   Also pt decided since she just got the 290 mg Linzess  filled she wants to do 290 mg once day and 145 mg the next day. Pt needs a new Rx for the 145 mg does sent in also so she can alternate doses

## 2023-10-03 ENCOUNTER — Other Ambulatory Visit (HOSPITAL_COMMUNITY): Payer: Self-pay

## 2023-10-03 DIAGNOSIS — M5459 Other low back pain: Secondary | ICD-10-CM | POA: Diagnosis not present

## 2023-10-03 DIAGNOSIS — M546 Pain in thoracic spine: Secondary | ICD-10-CM | POA: Diagnosis not present

## 2023-10-07 ENCOUNTER — Telehealth: Payer: Self-pay | Admitting: *Deleted

## 2023-10-07 NOTE — Telephone Encounter (Signed)
 Pt just wanted to confirm that she is suppose to take 145 mcg every other day and alt with the 290 mcg. I did confirm that's what PCP advised

## 2023-10-07 NOTE — Telephone Encounter (Signed)
 Copied from CRM #[id number]. Topic: Clinical - Medication Question >> Oct 07, 2023  9:51 AM Mesmerise C wrote: Reason for CRM: Patient inquiring if supposed to be alternating between linaclotide  (LINZESS ) for 145 mcg and 290 mcg to help with diarrhea, patient would like a call back to be directed at [phone]

## 2023-10-08 DIAGNOSIS — M5459 Other low back pain: Secondary | ICD-10-CM | POA: Diagnosis not present

## 2023-10-08 DIAGNOSIS — M546 Pain in thoracic spine: Secondary | ICD-10-CM | POA: Diagnosis not present

## 2023-10-11 DIAGNOSIS — M5459 Other low back pain: Secondary | ICD-10-CM | POA: Diagnosis not present

## 2023-10-11 DIAGNOSIS — M546 Pain in thoracic spine: Secondary | ICD-10-CM | POA: Diagnosis not present

## 2023-10-15 ENCOUNTER — Telehealth: Payer: Self-pay | Admitting: Family Medicine

## 2023-10-15 DIAGNOSIS — M546 Pain in thoracic spine: Secondary | ICD-10-CM | POA: Diagnosis not present

## 2023-10-15 DIAGNOSIS — M5459 Other low back pain: Secondary | ICD-10-CM | POA: Diagnosis not present

## 2023-10-15 NOTE — Telephone Encounter (Signed)
 Copied from CRM #[id number]. Topic: General - Call Back - No Documentation >> Oct 15, 2023 11:27 AM Mercedes MATSU wrote: Reason for CRM: Patient has a question about changing her medication patient can be reached at [phone].

## 2023-10-15 NOTE — Telephone Encounter (Signed)
 Called pt back and she had multiple issues. Pt said her GI put her on Protonix  and she took it for one month but it caused hiccups and also she read that you are only suppose to take it for 2 weeks max so she stopped taking it and started back taking the omeprazole  but her GERD and reflux sxs are worsening and she also has a sulfa  taste in her mouth especially when she eats chicken. She has a lot of the meals delivered to her and it has a small 2 oz cup of pico de gallo that comes with it. Pt not sure if that is causing it but she only uses half of that.  Pt has been seeing PT and she also talked to her mom and they are both concerned about her not having a good BM in over a week. Pt said that only little bit of stool is coming out. Still getting urge to have BM, but when goes to the bathroom little to nothing comes out.  Also she just hasn't felt good she said she doesn't think she's eating enough and at night her meter told her that her BS was 55 but she hadn't eaten dinner that night. Pt said her depression is worsening, and fibromyalgia is worsening  Pt also said she wants to wait until she is done with her PT because she thinks about seeing a nutritionist   **pt has appt scheduled Thursday to discuss all of this. I told pt to keep appt and pt said she doesn't think she needs to if PCP just advise her in this phone note on what to do. I did advise pt that this is a lot to try to address in a phone note and to keep appt on Thursday. Pt still said she's not sure if she will keep appt**

## 2023-10-15 NOTE — Telephone Encounter (Signed)
 She needs to follow up with GI provider about the GERD symptoms and constipation (since it is still a problem)  Also to her endocrinologist about the low glucose and appetite issue to see if they need to change her medication  Reach out so psychiatrist and counselor about the depression   This is a lot of issues -keep appointment Thursday  We may only be able to address a few at a time but will do the best we can

## 2023-10-16 NOTE — Telephone Encounter (Signed)
 Left VM requesting pt to call the office back

## 2023-10-16 NOTE — Telephone Encounter (Unsigned)
 Copied from CRM #[id number]. Topic: General - Other >> Oct 16, 2023 10:14 AM Franky GRADE wrote: Reason for CRM: Patient is returning a call she received from Lavern Maslow, I advised of Dr.Tower's message. She will keep the appointment for Thursday and will reach out to her other providers to discuss symptoms.

## 2023-10-17 ENCOUNTER — Encounter: Payer: Self-pay | Admitting: Family Medicine

## 2023-10-17 ENCOUNTER — Ambulatory Visit: Admitting: Family Medicine

## 2023-10-17 VITALS — BP 106/70 | HR 83 | Temp 98.2°F | Ht 64.0 in | Wt 223.1 lb

## 2023-10-17 DIAGNOSIS — M546 Pain in thoracic spine: Secondary | ICD-10-CM | POA: Diagnosis not present

## 2023-10-17 DIAGNOSIS — E119 Type 2 diabetes mellitus without complications: Secondary | ICD-10-CM | POA: Diagnosis not present

## 2023-10-17 DIAGNOSIS — K5909 Other constipation: Secondary | ICD-10-CM | POA: Diagnosis not present

## 2023-10-17 DIAGNOSIS — K219 Gastro-esophageal reflux disease without esophagitis: Secondary | ICD-10-CM

## 2023-10-17 DIAGNOSIS — M940 Chondrocostal junction syndrome [Tietze]: Secondary | ICD-10-CM | POA: Diagnosis not present

## 2023-10-17 DIAGNOSIS — M5459 Other low back pain: Secondary | ICD-10-CM | POA: Diagnosis not present

## 2023-10-17 MED ORDER — FAMOTIDINE 20 MG PO TABS: 20.0000 mg | ORAL_TABLET | Freq: Two times a day (BID) | ORAL | 1 refills | Status: DC | PRN

## 2023-10-17 MED ORDER — OXYBUTYNIN CHLORIDE ER 15 MG PO TB24: 15.0000 mg | ORAL_TABLET | Freq: Every day | ORAL | 1 refills | Status: AC

## 2023-10-17 MED ORDER — PRAVASTATIN SODIUM 20 MG PO TABS: 20.0000 mg | ORAL_TABLET | Freq: Every day | ORAL | 0 refills | Status: DC

## 2023-10-17 NOTE — Assessment & Plan Note (Signed)
 Ongoing  Worse with mounjaro (pt still thinks benefits outweigh side effects of this medicine)  Continues alternating linzess  145 with 290 mg as tolerated  Referral done to GI for follow up

## 2023-10-17 NOTE — Assessment & Plan Note (Signed)
 Mid chest discomfort that worsens with eating /over eating and eating spicy or acidic foods (like pico)   Reviewed last GI note from last fall  Reviewed last egd Still taking omeprazole  40 mg bid (protonix  did not work much better) She never did try pepcid  so this was sent for prn use  Encouraged avoiding triggers Would benefit from follow up -GI referral done   I still believe that mounjaro (for diabetes) worsens this /causes some gastroparesis but pt thinks the pros still outweigh the cons

## 2023-10-17 NOTE — Assessment & Plan Note (Signed)
 In the setting of myofacial pain disorder -which worsens with stress and depression  Seeing PT Continues to be bothersome

## 2023-10-17 NOTE — Patient Instructions (Addendum)
 Since your chest symptoms worsen with eating and eating certain things- I do think acid  Reflux may play a role Stay on the omeprazole  40 mg twice daily   I sent in some generic pepcid  (famotidine ) 20 mg twice daily AS NEEDED  I will send a referral to the GI in [place]  Give them a call  If they cannot see you we will change your referral to a different office   [facility]  [phone]

## 2023-10-17 NOTE — Assessment & Plan Note (Signed)
 Sees Dr Damian /endocrinology  Will send for last labs  Asked for ref to dm ed for refresher course -at [facility]

## 2023-10-17 NOTE — Progress Notes (Signed)
 Subjective:    Patient ID: Mackenzie Bonilla, female    DOB: 1965-11-14, 58 y.o.   MRN: [id number]  HPI  Wt Readings from Last 3 Encounters:  10/17/23 223 lb 2 oz (101.2 kg)  07/23/23 234 lb 9.6 oz (106.4 kg)  07/17/23 234 lb 9.6 oz (106.4 kg)   38.30 kg/m  Vitals:   10/17/23 1226  BP: 106/70  Pulse: 83  Temp: 98.2 F (36.8 C)  SpO2: 96%   Pt presents with  GI complaints   GERD Has a sulfar taste in her mouth Esophageal discomfort - ongoing  Does not get acid in her mouth   Eats small amounts  Has had pico (can make this worse)   Not taking pepcid  -never tried it  Currently -omeprazole  40 mg twice daily   Tried protonix  40 mg bid -thinks it may have worked better but gave her hiccoughs    Still on mounjaro 12.5 mg  Still gets some cravings     Last GI note 11/2022 Mackenzie Bonilla is a 58 y.o. y/o female returns for follow-up of:   Irritable bowel syndrome, constipation predominant Continue MiraLAX 1 capful in a drink once daily Add Benefiber powder mix 1 or 2 tablespoons in a drink once daily   2.  GERD             Continue omeprazole  40 mg twice daily             Add OTC Pepcid  20 mg once or twice daily as needed             Recommend Lifestyle Modifications to prevent Acid Reflux.  Rec. Avoid coffee, sodas, peppermint, citrus fruits, and spicey foods.  Avoid eating 2-3 hours before bedtime.              We discussed adverse side effects of PPIs to include vitamin deficiencies, osteoporosis, renal insufficiency, dementia and increased risk of C. Difficile.  Recommend take lowest effective dose of PPI necessary to control acid reflux.  OK to add H2RB (Pepcid  20mg  daily) or antiacid if needed for breakthrough acid reflux.    3.  Dysphagia -most likely due to acid reflux             Gave patient reassurance regarding normal barium swallow and EGD results.     4.  Colon cancer screening             Colonoscopy 10/2022 was normal.  Repeat in 10  years.   After this pt req to change to protonix  and it was sent in on 12/12/22 for 40 mg bid   Last egd was 10/2022 -reassuring   Seeing PT  Has costochondritis that also affects back and chest        Lab Results  Component Value Date   WBC 7.6 07/18/2022   HGB 14.2 07/18/2022   HCT 42.8 07/18/2022   MCV 95.7 07/18/2022   PLT 258.0 07/18/2022   Lab Results  Component Value Date   NA 142 07/18/2022   K 4.4 07/18/2022   CO2 20 07/18/2022   GLUCOSE 108 (H) 07/18/2022   BUN 17 07/18/2022   CREATININE 1.10 07/18/2022   CALCIUM 9.4 07/18/2022   GFR 55.98 (L) 07/18/2022   GFRNONAA 62 05/21/2017    Pravastatin   Lab Results  Component Value Date   CHOL 192 06/15/2020   HDL 34.20 (L) 06/15/2020   LDLCALC 121 (H) 02/11/2019   LDLDIRECT 130.0 06/15/2020   TRIG 211.0 (  H) 06/15/2020   CHOLHDL 6 06/15/2020     Patient Active Problem List   Diagnosis Date Noted   Costochondritis 10/17/2023   Meralgia paraesthetica, left 07/17/2023   Steroid side effects 12/28/2022   Dysphagia 11/01/2022   Gastric polyps 11/01/2022   Nontraumatic tear of skin 09/05/2022   Adverse effect of proton pump inhibitor 07/18/2022   Colon cancer screening 07/18/2022   Nasal congestion 04/30/2022   Breast asymmetry 02/21/2022   Muscle twitch 02/21/2022   Abdominal pannus 02/21/2022   Recurrent UTI 12/21/2021   Paresthesia 11/15/2021   Ingrown nail of great toe of left foot 11/15/2021   Keratosis 08/31/2021   Hair loss 06/19/2021   Breast pain 12/20/2020   Encounter for screening mammogram for breast cancer 12/13/2020   Lumbar facet arthropathy 12/29/2019   Chronic bilateral thoracic back pain 12/08/2019   Lumbar degenerative disc disease 12/08/2019   Fibromyalgia 12/08/2019   Chronic pain syndrome 12/08/2019   Lumbar radicular pain (left S1) 12/08/2019   Mid back pain 02/11/2019   Multiple joint pain 11/12/2018   Gallstones 04/19/2017   Diabetic polyneuropathy associated with type 2  diabetes mellitus (HCC) 04/07/2017   Diabetic angiopathy (HCC) 04/07/2017   Vasomotor symptoms due to menopause 04/07/2017   Mobility impaired 11/12/2016   Urinary incontinence 12/02/2015   Candidal intertrigo 04/06/2015   Cystocele 04/06/2015   Chronic constipation 04/06/2015   Myofascial pain syndrome of lumbar spine 12/28/2014   Hypertriglyceridemia 10/14/2014   Abdominal pain 04/23/2012   Uric acid kidney stone 11/20/2011   HYPERHIDROSIS 11/07/2009   Menopausal symptoms 06/17/2009   Type 2 diabetes mellitus without complication, without long-term current use of insulin (HCC) 08/23/2008   Vitamin D  deficiency 05/07/2008   Vitamin B 12 deficiency 06/04/2007   CHRONIC FATIGUE SYNDROME 11/15/2006   DIVERTICULITIS, HX OF 11/15/2006   PCOS (polycystic ovarian syndrome) 10/16/2006   Hyperlipidemia associated with type 2 diabetes mellitus (HCC) 10/16/2006   Morbid obesity (HCC) 10/16/2006   Generalized anxiety disorder 10/16/2006   PANIC DISORDER 10/16/2006   BULIMIA 10/16/2006   Chronic depression 10/16/2006   CARPAL TUNNEL SYNDROME 10/16/2006   Lymphedema 10/16/2006   Allergic rhinitis 10/16/2006   Mild intermittent asthma 10/16/2006   Temporomandibular joint disorder 10/16/2006   GERD 10/16/2006   Irritable bowel syndrome 10/16/2006   Osteoarthritis 10/16/2006   COUGH, CHRONIC 10/16/2006   Past Medical History:  Diagnosis Date   Allergy    allergic rhinitis   Anemia    Anxiety    Arthritis    osteoarthritis   Asthma    Claustrophobia    does not like oxygen mask covering face   Depression    Diabetes mellitus without complication (HCC)    Fatty liver 07/2008   abd. ultrasound - fatty liver ; slt dilated cbd (no stones ) 06/10// abd.  ultrasound normal on 04/2006   Fibromyalgia    GERD (gastroesophageal reflux disease)    EGD negative 06/2001// EGD erythematous mucosa, polyp 08/2008   High uric acid in 24 hour urine specimen    Hyperhidrosis    Hyperlipidemia     Kidney stones    Lymphedema    Obesity    PCOS (polycystic ovarian syndrome)    Tachycardia    TMJ (dislocation of temporomandibular joint)    Past Surgical History:  Procedure Laterality Date   BIOPSY  11/01/2022   Procedure: BIOPSY;  Surgeon: Jinny Carmine, MD;  Location: [facility]COPY;  Service: Endoscopy;;   BREAST BIOPSY Right 2016   neg  BREAST LUMPECTOMY WITH RADIOACTIVE SEED LOCALIZATION Right 08/02/2014   Procedure: BREAST LUMPECTOMY WITH RADIOACTIVE SEED LOCALIZATION;  Surgeon: Donnice Bury, MD;  Location: [facility] OR;  Service: General;  Laterality: Right;   COLONOSCOPY  08/12/2012   Completed by Dr. Luba, normal exam.    COLONOSCOPY WITH PROPOFOL  N/A 11/01/2022   Procedure: COLONOSCOPY WITH PROPOFOL ;  Surgeon: Jinny Carmine, MD;  Location: [facility] ENDOSCOPY;  Service: Endoscopy;  Laterality: N/A;   ENDOMETRIAL BIOPSY  01/2004   ESOPHAGOGASTRODUODENOSCOPY     ESOPHAGOGASTRODUODENOSCOPY (EGD) WITH PROPOFOL  N/A 08/18/2019   Procedure: ESOPHAGOGASTRODUODENOSCOPY (EGD) WITH PROPOFOL ;  Surgeon: Jinny Carmine, MD;  Location: [facility] ENDOSCOPY;  Service: Endoscopy;  Laterality: N/A;   ESOPHAGOGASTRODUODENOSCOPY (EGD) WITH PROPOFOL  N/A 11/01/2022   Procedure: ESOPHAGOGASTRODUODENOSCOPY (EGD) WITH PROPOFOL ;  Surgeon: Jinny Carmine, MD;  Location: [facility] ENDOSCOPY;  Service: Endoscopy;  Laterality: N/A;   FOOT SURGERY     SEPTOPLASTY     two times   TONSILLECTOMY     UTERINE FIBROID SURGERY  06/2005   ablation   uterine tumor  09/2001   VAGUS NERVE STIMULATOR INSERTION     Social History   Tobacco Use   Smoking status: Never   Smokeless tobacco: Never  Vaping Use   Vaping status: Never Used  Substance Use Topics   Alcohol use: No    Alcohol/week: 0.0 standard drinks of alcohol   Drug use: No   Family History  Problem Relation Age of Onset   Hypertension Father    Cancer Father        pancreatic cancer   Hypertension Sister    Heart disease Maternal Grandmother 29       MI    Diabetes Maternal Grandmother    Heart disease Paternal Grandmother 63       MI   Diabetes Paternal Grandmother    Breast cancer Neg Hx    Allergies  Allergen Reactions   Cephalexin    Codeine     rash   Duloxetine    Levaquin  [Levofloxacin ] Nausea Only   Lisinopril Cough   Metformin  And Related Diarrhea    GI side effects    Quetiapine Other (See Comments)   Sertraline Hcl     REACTION: vivid dreams   Triazolam     REACTION: ? reaction   Zaleplon    Zocor [Simvastatin - High Dose]     achey   Zolpidem Tartrate     hallucinations   Zyrtec [Cetirizine]     Sedation    Hydrocodone  Itching and Rash    REACTION: redness rash itching   Norelgestromin-Eth Estradiol      Patch didn't stick to skin   Current Outpatient Medications on File Prior to Visit  Medication Sig Dispense Refill   acetaminophen  (TYLENOL ) 500 MG tablet Take 500 mg by mouth every 6 (six) hours as needed.      albuterol  (VENTOLIN  HFA) 108 (90 Base) MCG/ACT inhaler INHALE 2 PUFFS INTO THE LUNGS EVERY 6 HOURS AS NEEDED FOR WHEEZING. 18 g 1   ALPRAZolam (XANAX) 1 MG tablet Take 2 mg by mouth at bedtime.     ammonium lactate  (AMLACTIN) 12 % cream Apply 1 Application topically as needed for dry skin. 385 g 5   BD PEN NEEDLE NANO U/F 32G X 4 MM MISC daily.     Blood Glucose Monitoring Suppl (FIFTY50 GLUCOSE METER 2.0) w/Device KIT Use as directed     cyclobenzaprine  (FLEXERIL ) 5 MG tablet TAKE 1 TABLET BY MOUTH 3 TIMES DAILY AS NEEDED  FOR MUSCLE SPASMS 90 tablet 0   desvenlafaxine (PRISTIQ) 50 MG 24 hr tablet Take 50 mg by mouth daily.     dimenhyDRINATE (DRAMAMINE) 50 MG tablet Take 50 mg by mouth every 8 (eight) hours as needed.     docusate sodium  (COLACE) 100 MG capsule TAKE ONE CAPSULE BY MOUTH TWICE DAILY. 180 capsule 1   FLUoxetine (PROZAC) 20 MG capsule Take 20 mg by mouth daily.     fluticasone  (FLONASE ) 50 MCG/ACT nasal spray Place into both nostrils daily.     gabapentin  (NEURONTIN ) 100 MG capsule Take 1  capsule (100 mg total) by mouth daily. With breakfast 90 capsule 1   gabapentin  (NEURONTIN ) 300 MG capsule Take 1 capsule (300 mg total) by mouth at bedtime. 90 capsule 1   hydrocortisone  (ANUSOL -HC) 2.5 % rectal cream Apply to affected (hemorrhoid area) once daily at bedtime as needed for no more than 10 days in a row 30 g 0   ketoconazole  (NIZORAL ) 2 % cream Apply 1 Application topically daily as needed for irritation. 30 g 0   linaclotide  (LINZESS ) 145 MCG CAPS capsule Take 1 capsule (145 mcg total) by mouth every other day. 45 capsule 0   linaclotide  (LINZESS ) 290 MCG CAPS capsule TAKE 1 CAPSULE BY MOUTH ONCE DAILY BEFORE BREAKFAST 90 capsule 1   losartan (COZAAR) 25 MG tablet      minoxidil  (LONITEN ) 2.5 MG tablet Take 1 tablet (2.5 mg total) by mouth daily. 90 tablet 1   mometasone  (ELOCON ) 0.1 % cream Apply to affected area left thigh twice daily as needed for flares/itch. Avoid applying to face, groin, and axilla. Use as directed. Long-term use can cause thinning of the skin. 45 g 1   MOUNJARO 12.5 MG/0.5ML Pen Inject 12.5 mg into the skin once a week.     omeprazole  (PRILOSEC) 40 MG capsule Take 1 capsule (40 mg total) by mouth in the morning and at bedtime. 180 capsule 2   Polyethyl Glycol-Propyl Glycol (SYSTANE OP) Apply 1 drop to eye daily as needed.     Simethicone (GAS-X PO) Take by mouth as needed.     spironolactone  (ALDACTONE ) 100 MG tablet TAKE 1 TABLET BY MOUTH DAILY 90 tablet 1   traMADol  (ULTRAM ) 50 MG tablet Take 1 tablet (50 mg total) by mouth every 12 (twelve) hours as needed for severe pain (pain score 7-10). Caution of sedation 30 tablet 0   traZODone (DESYREL) 150 MG tablet Take 300 mg by mouth at bedtime.     TRESIBA FLEXTOUCH 100 UNIT/ML FlexTouch Pen Inject 36 Units into the skin daily.     zinc oxide (BALMEX) 11.3 % CREA cream Apply 1 application topically 2 (two) times daily.     No current facility-administered medications on file prior to visit.    Review of  Systems  Constitutional:  Positive for fatigue. Negative for activity change, appetite change, fever and unexpected weight change.  HENT:  Negative for congestion, ear pain, rhinorrhea, sinus pressure and sore throat.   Eyes:  Negative for pain, redness and visual disturbance.  Respiratory:  Negative for cough, shortness of breath and wheezing.   Cardiovascular:  Negative for chest pain and palpitations.  Gastrointestinal:  Positive for constipation and nausea. Negative for abdominal distention, abdominal pain, blood in stool, diarrhea, rectal pain and vomiting.       Esophageal fullness worse after eating   Occational fleeting nausea   Endocrine: Negative for polydipsia and polyuria.  Genitourinary:  Negative for dysuria, frequency and  urgency.  Musculoskeletal:  Positive for arthralgias, back pain and myalgias.  Skin:  Negative for pallor and rash.  Allergic/Immunologic: Negative for environmental allergies.  Neurological:  Negative for dizziness, syncope and headaches.  Hematological:  Negative for adenopathy. Does not bruise/bleed easily.  Psychiatric/Behavioral:  Negative for decreased concentration and dysphoric mood. The patient is not nervous/anxious.        Objective:   Physical Exam Constitutional:      General: She is not in acute distress.    Appearance: Normal appearance. She is well-developed. She is obese. She is not ill-appearing or diaphoretic.  HENT:     Head: Normocephalic and atraumatic.     Mouth/Throat:     Mouth: Mucous membranes are moist.  Eyes:     General: No scleral icterus.    Conjunctiva/sclera: Conjunctivae normal.     Pupils: Pupils are equal, round, and reactive to light.  Cardiovascular:     Rate and Rhythm: Normal rate and regular rhythm.     Heart sounds: Normal heart sounds.  Pulmonary:     Effort: Pulmonary effort is normal. No respiratory distress.     Breath sounds: Normal breath sounds. No wheezing or rales.  Chest:     Chest wall:  Tenderness present.  Abdominal:     General: Bowel sounds are normal. There is no distension.     Palpations: Abdomen is soft. There is no mass.     Tenderness: There is no abdominal tenderness. There is no right CVA tenderness, left CVA tenderness, guarding or rebound.  Musculoskeletal:     Cervical back: Normal range of motion and neck supple.  Lymphadenopathy:     Cervical: No cervical adenopathy.  Skin:    General: Skin is warm and dry.     Coloration: Skin is not pale.     Findings: No erythema.  Neurological:     Mental Status: She is alert.           Assessment & Plan:   Problem List Items Addressed This Visit       Digestive   GERD - Primary   Mid chest discomfort that worsens with eating /over eating and eating spicy or acidic foods (like pico)   Reviewed last GI note from last fall  Reviewed last egd Still taking omeprazole  40 mg bid (protonix  did not work much better) She never did try pepcid  so this was sent for prn use  Encouraged avoiding triggers Would benefit from follow up -GI referral done   I still believe that mounjaro (for diabetes) worsens this /causes some gastroparesis but pt thinks the pros still outweigh the cons       Relevant Medications   famotidine  (PEPCID ) 20 MG tablet   Other Relevant Orders   Ambulatory referral to Gastroenterology   Chronic constipation   Ongoing  Worse with mounjaro (pt still thinks benefits outweigh side effects of this medicine)  Continues alternating linzess  145 with 290 mg as tolerated  Referral done to GI for follow up       Relevant Orders   Ambulatory referral to Gastroenterology     Endocrine   Type 2 diabetes mellitus without complication, without long-term current use of insulin (HCC)   Sees Dr Damian /endocrinology  Will send for last labs  Asked for ref to dm ed for refresher course -at [facility]       Relevant Medications   pravastatin  (PRAVACHOL ) 20 MG tablet   Other Relevant Orders    Ambulatory referral  to diabetic education     Musculoskeletal and Integument   Costochondritis   In the setting of myofacial pain disorder -which worsens with stress and depression  Seeing PT Continues to be bothersome

## 2023-10-22 ENCOUNTER — Ambulatory Visit (INDEPENDENT_AMBULATORY_CARE_PROVIDER_SITE_OTHER): Admitting: Podiatry

## 2023-10-22 VITALS — Ht 64.0 in | Wt 223.1 lb

## 2023-10-22 DIAGNOSIS — B351 Tinea unguium: Secondary | ICD-10-CM | POA: Diagnosis not present

## 2023-10-22 DIAGNOSIS — M79675 Pain in left toe(s): Secondary | ICD-10-CM

## 2023-10-22 DIAGNOSIS — M546 Pain in thoracic spine: Secondary | ICD-10-CM | POA: Diagnosis not present

## 2023-10-22 DIAGNOSIS — M5459 Other low back pain: Secondary | ICD-10-CM | POA: Diagnosis not present

## 2023-10-22 DIAGNOSIS — M79674 Pain in right toe(s): Secondary | ICD-10-CM

## 2023-10-22 NOTE — Progress Notes (Signed)
 Chief Complaint  Patient presents with   Nail Problem    Pt is here for [facility].    SUBJECTIVE Patient presents to office today complaining of elongated, thickened nails that cause pain while ambulating in shoes.  Patient is unable to trim their own nails.  Patient is also been experiencing some pain and tenderness associated to left great toe joint.  Idiopathic onset ongoing for few months now.  Intermittent painful toe depending on activity.  She also says it is more painful when she elevates her foot.  Patient is here for further evaluation and treatment.   Past Medical History:  Diagnosis Date   Allergy    allergic rhinitis   Anemia    Anxiety    Arthritis    osteoarthritis   Asthma    Claustrophobia    does not like oxygen mask covering face   Depression    Diabetes mellitus without complication (HCC)    Fatty liver 07/2008   abd. ultrasound - fatty liver ; slt dilated cbd (no stones ) 06/10// abd.  ultrasound normal on 04/2006   Fibromyalgia    GERD (gastroesophageal reflux disease)    EGD negative 06/2001// EGD erythematous mucosa, polyp 08/2008   High uric acid in 24 hour urine specimen    Hyperhidrosis    Hyperlipidemia    Kidney stones    Lymphedema    Obesity    PCOS (polycystic ovarian syndrome)    Tachycardia    TMJ (dislocation of temporomandibular joint)     Allergies  Allergen Reactions   Cephalexin    Codeine     rash   Duloxetine    Levaquin  [Levofloxacin ] Nausea Only   Lisinopril Cough   Metformin  And Related Diarrhea    GI side effects    Quetiapine Other (See Comments)   Sertraline Hcl     REACTION: vivid dreams   Triazolam     REACTION: ? reaction   Zaleplon    Zocor [Simvastatin - High Dose]     achey   Zolpidem Tartrate     hallucinations   Zyrtec [Cetirizine]     Sedation    Hydrocodone  Itching and Rash    REACTION: redness rash itching   Norelgestromin-Eth Estradiol      Patch didn't stick to skin     OBJECTIVE General  Patient is awake, alert, and oriented x 3 and in no acute distress. Derm Skin is dry and supple bilateral. Negative open lesions or macerations. Remaining integument unremarkable. Nails are tender, long, thickened and dystrophic with subungual debris, consistent with onychomycosis, 1-5 bilateral. No signs of infection noted. Vasc  DP and PT pedal pulses palpable bilaterally. Temperature gradient within normal limits.  Neuro grossly intact via light touch Musculoskeletal Exam No symptomatic pedal deformities noted bilateral. Muscular strength within normal limits.  There are some pain and tenderness associated to the plantar aspect of the left great toe joint  ASSESSMENT 1.  Pain due to onychomycosis of toenails both 2.  Capsulitis left great toe  PLAN OF CARE -Patient evaluated today.  -Instructed to maintain good pedal hygiene and foot care.  -Mechanical debridement of nails 1-5 bilaterally performed using a nail nipper. Filed with dremel without incident.  - Silicone toe caps were provided to apply to the great toes to alleviate pressure from the bilateral great toes -Continue wearing good supportive [employer] tennis shoes -Return to clinic in 3 mos.    Dot Gazella, DPM [facility]  Dr. Dot Gazella, DPM    [address] [place], [place] [address]                Office [phone]  Fax [phone]

## 2023-10-24 DIAGNOSIS — M5459 Other low back pain: Secondary | ICD-10-CM | POA: Diagnosis not present

## 2023-10-24 DIAGNOSIS — M546 Pain in thoracic spine: Secondary | ICD-10-CM | POA: Diagnosis not present

## 2023-10-25 ENCOUNTER — Telehealth: Payer: Self-pay | Admitting: *Deleted

## 2023-10-25 DIAGNOSIS — R809 Proteinuria, unspecified: Secondary | ICD-10-CM | POA: Diagnosis not present

## 2023-10-25 DIAGNOSIS — Z794 Long term (current) use of insulin: Secondary | ICD-10-CM | POA: Diagnosis not present

## 2023-10-25 DIAGNOSIS — E1129 Type 2 diabetes mellitus with other diabetic kidney complication: Secondary | ICD-10-CM | POA: Diagnosis not present

## 2023-10-25 NOTE — Telephone Encounter (Signed)
-----   Message from [facility] sent at 10/17/2023  5:04 PM EDT ----- Regarding: RE: labs Please let her know last lipid panel was 09/2022 I think - she can get that at endo or here  Thanks ----- Message ----- From: Rayleen Glendora HERO, CMA Sent: 10/17/2023   2:53 PM EDT To: Laine DELENA Balls, MD Subject: labs                                           I saw your note requesting endo labs on pt. I went in care everywhere and was able to link all of her labs there to epic (can see them in our lab tab now) and also allowed for you to see the office visit note. You should be able to view everything let me know if you are not able to

## 2023-10-25 NOTE — Telephone Encounter (Signed)
 Pt notified of Dr. Graham comments. Pt is getting labs with endo today she is going to see if they can add lipid labs if not she will call back to get lab appt done here

## 2023-10-29 DIAGNOSIS — M5459 Other low back pain: Secondary | ICD-10-CM | POA: Diagnosis not present

## 2023-10-29 DIAGNOSIS — M546 Pain in thoracic spine: Secondary | ICD-10-CM | POA: Diagnosis not present

## 2023-10-31 DIAGNOSIS — M5459 Other low back pain: Secondary | ICD-10-CM | POA: Diagnosis not present

## 2023-10-31 DIAGNOSIS — M546 Pain in thoracic spine: Secondary | ICD-10-CM | POA: Diagnosis not present

## 2023-11-01 DIAGNOSIS — Z794 Long term (current) use of insulin: Secondary | ICD-10-CM | POA: Diagnosis not present

## 2023-11-01 DIAGNOSIS — E1129 Type 2 diabetes mellitus with other diabetic kidney complication: Secondary | ICD-10-CM | POA: Diagnosis not present

## 2023-11-01 DIAGNOSIS — E66813 Obesity, class 3: Secondary | ICD-10-CM | POA: Diagnosis not present

## 2023-11-01 DIAGNOSIS — E1142 Type 2 diabetes mellitus with diabetic polyneuropathy: Secondary | ICD-10-CM | POA: Diagnosis not present

## 2023-11-01 DIAGNOSIS — Z1331 Encounter for screening for depression: Secondary | ICD-10-CM | POA: Diagnosis not present

## 2023-11-01 DIAGNOSIS — R809 Proteinuria, unspecified: Secondary | ICD-10-CM | POA: Diagnosis not present

## 2023-11-05 DIAGNOSIS — M546 Pain in thoracic spine: Secondary | ICD-10-CM | POA: Diagnosis not present

## 2023-11-05 DIAGNOSIS — M5459 Other low back pain: Secondary | ICD-10-CM | POA: Diagnosis not present

## 2023-11-11 ENCOUNTER — Telehealth: Payer: Self-pay | Admitting: *Deleted

## 2023-11-11 NOTE — Telephone Encounter (Signed)
 Copied from CRM #[id number]. Topic: Clinical - Medication Question >> Nov 11, 2023 10:40 AM Robinson H wrote: Reason for CRM: Patient is inquiring if provider wants her to continue her linaclotide  (LINZESS ) 290 MCG CAPS capsule and linaclotide  (LINZESS ) 145 MCG CAPS capsule after 2 weeks when she's done with these pills. Patient also states the [facility] has been cut in half by the Dr. to 16 units instead of 36 units and A1C is a 5 and wants to know should she continue the medications.  Adithi [phone]

## 2023-11-11 NOTE — Telephone Encounter (Signed)
 If the linzess  is helping- continue it  Are you still taking the 290 every other and the 145 every other ?   Good news about decreasing the tresiba  Continue per the endocrinologist

## 2023-11-11 NOTE — Telephone Encounter (Signed)
 Pt said said call ctr may have misunderstood what she said she has no questions regarding DM meds. Regarding the Linzess  she has a BM about once a week, most days she is feeling very constipated, and having stomach cramps. Pt said today she is having sharp pains she thinks it's gas so she took some gas-x. Pt said she has called [facility] and they are not scheduling pt's yet due to new provider hasn't started. Given the constipation pt was asking if she should keep taking the 145 mg every other day, alternating with the 290 mg.

## 2023-11-11 NOTE — Telephone Encounter (Signed)
 Yes, that was what she did before and it helped (did it not?)  This is based on her past experience and what worked before   Does she need a refill ?  If so I may be able to specify 1 pill of 145 every other day and 2 pills every other day

## 2023-11-14 ENCOUNTER — Ambulatory Visit

## 2023-11-14 ENCOUNTER — Inpatient Hospital Stay: Admitting: Anesthesiology

## 2023-11-14 ENCOUNTER — Emergency Department

## 2023-11-14 ENCOUNTER — Encounter
Admission: EM | Disposition: A | Discharge status: Home / Self Care | Payer: Self-pay | Source: Home / Self Care | Attending: Emergency Medicine

## 2023-11-14 ENCOUNTER — Inpatient Hospital Stay

## 2023-11-14 ENCOUNTER — Ambulatory Visit: Payer: Self-pay

## 2023-11-14 ENCOUNTER — Observation Stay
Admission: EM | Admit: 2023-11-14 | Discharge: 2023-11-15 | Disposition: A | Discharge status: Home / Self Care | Attending: Urology | Admitting: Urology

## 2023-11-14 ENCOUNTER — Encounter: Payer: Self-pay | Admitting: Emergency Medicine

## 2023-11-14 ENCOUNTER — Other Ambulatory Visit: Payer: Self-pay

## 2023-11-14 DIAGNOSIS — E119 Type 2 diabetes mellitus without complications: Secondary | ICD-10-CM | POA: Diagnosis not present

## 2023-11-14 DIAGNOSIS — N202 Calculus of kidney with calculus of ureter: Secondary | ICD-10-CM

## 2023-11-14 DIAGNOSIS — R000 Tachycardia, unspecified: Secondary | ICD-10-CM | POA: Diagnosis not present

## 2023-11-14 DIAGNOSIS — J45901 Unspecified asthma with (acute) exacerbation: Secondary | ICD-10-CM | POA: Insufficient documentation

## 2023-11-14 DIAGNOSIS — Z79899 Other long term (current) drug therapy: Secondary | ICD-10-CM | POA: Insufficient documentation

## 2023-11-14 DIAGNOSIS — E785 Hyperlipidemia, unspecified: Secondary | ICD-10-CM | POA: Diagnosis not present

## 2023-11-14 DIAGNOSIS — I10 Essential (primary) hypertension: Secondary | ICD-10-CM | POA: Diagnosis not present

## 2023-11-14 DIAGNOSIS — R109 Unspecified abdominal pain: Secondary | ICD-10-CM | POA: Diagnosis present

## 2023-11-14 DIAGNOSIS — N23 Unspecified renal colic: Principal | ICD-10-CM | POA: Insufficient documentation

## 2023-11-14 DIAGNOSIS — N390 Urinary tract infection, site not specified: Secondary | ICD-10-CM | POA: Diagnosis not present

## 2023-11-14 DIAGNOSIS — R10A2 Flank pain, left side: Secondary | ICD-10-CM | POA: Diagnosis not present

## 2023-11-14 DIAGNOSIS — K219 Gastro-esophageal reflux disease without esophagitis: Secondary | ICD-10-CM | POA: Diagnosis not present

## 2023-11-14 DIAGNOSIS — A419 Sepsis, unspecified organism: Secondary | ICD-10-CM | POA: Diagnosis not present

## 2023-11-14 DIAGNOSIS — N132 Hydronephrosis with renal and ureteral calculous obstruction: Secondary | ICD-10-CM | POA: Diagnosis not present

## 2023-11-14 DIAGNOSIS — I517 Cardiomegaly: Secondary | ICD-10-CM | POA: Diagnosis not present

## 2023-11-14 DIAGNOSIS — N201 Calculus of ureter: Secondary | ICD-10-CM | POA: Diagnosis not present

## 2023-11-14 DIAGNOSIS — R918 Other nonspecific abnormal finding of lung field: Secondary | ICD-10-CM | POA: Diagnosis not present

## 2023-11-14 DIAGNOSIS — N3000 Acute cystitis without hematuria: Secondary | ICD-10-CM | POA: Diagnosis not present

## 2023-11-14 DIAGNOSIS — R911 Solitary pulmonary nodule: Secondary | ICD-10-CM | POA: Diagnosis not present

## 2023-11-14 DIAGNOSIS — R6510 Systemic inflammatory response syndrome (SIRS) of non-infectious origin without acute organ dysfunction: Secondary | ICD-10-CM | POA: Diagnosis not present

## 2023-11-14 DIAGNOSIS — R112 Nausea with vomiting, unspecified: Secondary | ICD-10-CM | POA: Diagnosis not present

## 2023-11-14 HISTORY — PX: CYSTOSCOPY W/ URETERAL STENT PLACEMENT: SHX1429

## 2023-11-14 LAB — URINALYSIS, ROUTINE W REFLEX MICROSCOPIC
Bilirubin Urine: NEGATIVE
Glucose, UA: NEGATIVE mg/dL
Ketones, ur: 5 mg/dL — AB
Nitrite: NEGATIVE
Protein, ur: NEGATIVE mg/dL
RBC / HPF: >50 RBC/hpf (ref 0–5)
Specific Gravity, Urine: 1.018 (ref 1.005–1.030)
pH: 5 (ref 5.0–8.0)

## 2023-11-14 LAB — COMPREHENSIVE METABOLIC PANEL WITH GFR
ALT: 16 U/L (ref 0–44)
AST: 22 U/L (ref 15–41)
Albumin: 4.2 g/dL (ref 3.5–5.0)
Alkaline Phosphatase: 73 U/L (ref 38–126)
Anion gap: 12 (ref 5–15)
BUN: 19 mg/dL (ref 6–20)
CO2: 24 mmol/L (ref 22–32)
Calcium: 9.5 mg/dL (ref 8.9–10.3)
Chloride: 103 mmol/L (ref 98–111)
Creatinine, Ser: 1.05 mg/dL — ABNORMAL HIGH (ref 0.44–1.00)
GFR, Estimated: >60 mL/min (ref >60)
Glucose, Bld: 112 mg/dL — ABNORMAL HIGH (ref 70–99)
Potassium: 3.7 mmol/L (ref 3.5–5.1)
Sodium: 139 mmol/L (ref 135–145)
Total Bilirubin: 0.8 mg/dL (ref 0.0–1.2)
Total Protein: 7.4 g/dL (ref 6.5–8.1)

## 2023-11-14 LAB — CBC
HCT: 45.7 % (ref 36.0–46.0)
Hemoglobin: 15.2 g/dL — ABNORMAL HIGH (ref 12.0–15.0)
MCH: 31.7 pg (ref 26.0–34.0)
MCHC: 33.3 g/dL (ref 30.0–36.0)
MCV: 95.4 fL (ref 80.0–100.0)
Platelets: 208 K/uL (ref 150–400)
RBC: 4.79 MIL/uL (ref 3.87–5.11)
RDW: 12.3 % (ref 11.5–15.5)
WBC: 13.9 K/uL — ABNORMAL HIGH (ref 4.0–10.5)
nRBC: 0 % (ref 0.0–0.2)

## 2023-11-14 LAB — GLUCOSE, CAPILLARY
Glucose-Capillary: 114 mg/dL — ABNORMAL HIGH (ref 70–99)
Glucose-Capillary: 113 mg/dL — ABNORMAL HIGH (ref 70–99)
Glucose-Capillary: 134 mg/dL — ABNORMAL HIGH (ref 70–99)

## 2023-11-14 LAB — TROPONIN I (HIGH SENSITIVITY): Troponin I (High Sensitivity): 3 ng/L (ref <18)

## 2023-11-14 LAB — HEMOGLOBIN A1C
Hgb A1c MFr Bld: 4.4 % — ABNORMAL LOW (ref 4.8–5.6)
Mean Plasma Glucose: 79.58 mg/dL

## 2023-11-14 LAB — LIPASE, BLOOD: Lipase: 43 U/L (ref 11–51)

## 2023-11-14 SURGERY — CYSTOSCOPY, WITH RETROGRADE PYELOGRAM AND URETERAL STENT INSERTION
Anesthesia: General | Site: Ureter | Laterality: Left

## 2023-11-14 MED ORDER — IOHEXOL 180 MG/ML  SOLN
INTRAMUSCULAR | Status: DC | PRN
  Administered 2023-11-14: 10 mL

## 2023-11-14 MED ORDER — PROPOFOL 10 MG/ML IV BOLUS
INTRAVENOUS | Status: AC
Start: 2023-11-14 — End: 2023-11-14
  Filled 2023-11-14: qty 20

## 2023-11-14 MED ORDER — SODIUM CHLORIDE 0.9 % IV BOLUS
1000.0000 mL | Freq: Once | INTRAVENOUS | Status: AC
  Administered 2023-11-14: 1000 mL via INTRAVENOUS

## 2023-11-14 MED ORDER — FENTANYL CITRATE (PF) 100 MCG/2ML IJ SOLN
INTRAMUSCULAR | Status: AC
  Filled 2023-11-14: qty 2

## 2023-11-14 MED ORDER — ALBUMIN HUMAN 5 % IV SOLN
12.5000 g | Freq: Once | INTRAVENOUS | Status: AC
Start: 2023-11-14 — End: 2023-11-14
  Administered 2023-11-14: 12.5 g via INTRAVENOUS

## 2023-11-14 MED ORDER — PHENYLEPHRINE 80 MCG/ML (10ML) SYRINGE FOR IV PUSH (FOR BLOOD PRESSURE SUPPORT)
PREFILLED_SYRINGE | INTRAVENOUS | Status: DC | PRN
  Administered 2023-11-14 (×2): 160 ug via INTRAVENOUS

## 2023-11-14 MED ORDER — PHENYLEPHRINE 80 MCG/ML (10ML) SYRINGE FOR IV PUSH (FOR BLOOD PRESSURE SUPPORT)
PREFILLED_SYRINGE | INTRAVENOUS | Status: AC
  Filled 2023-11-14: qty 10

## 2023-11-14 MED ORDER — FENTANYL CITRATE PF 50 MCG/ML IJ SOSY: 25.0000 ug | PREFILLED_SYRINGE | INTRAMUSCULAR | Status: DC | PRN

## 2023-11-14 MED ORDER — ONDANSETRON HCL 4 MG/2ML IJ SOLN
4.0000 mg | Freq: Once | INTRAMUSCULAR | Status: AC
  Administered 2023-11-14: 4 mg via INTRAVENOUS
  Filled 2023-11-14: qty 2

## 2023-11-14 MED ORDER — LIDOCAINE HCL (CARDIAC) PF 100 MG/5ML IV SOSY
PREFILLED_SYRINGE | INTRAVENOUS | Status: DC | PRN
  Administered 2023-11-14: 80 mg via INTRAVENOUS

## 2023-11-14 MED ORDER — SODIUM CHLORIDE 0.9 % IR SOLN
Status: DC | PRN
  Administered 2023-11-14: 3000 mL via INTRAVESICAL

## 2023-11-14 MED ORDER — PROPOFOL 1000 MG/100ML IV EMUL
INTRAVENOUS | Status: AC
  Filled 2023-11-14: qty 100

## 2023-11-14 MED ORDER — INSULIN ASPART 100 UNIT/ML IJ SOLN: 0.0000 [IU] | Freq: Every day | INTRAMUSCULAR | Status: DC

## 2023-11-14 MED ORDER — ONDANSETRON HCL 4 MG/2ML IJ SOLN
INTRAMUSCULAR | Status: DC | PRN
  Administered 2023-11-14: 4 mg via INTRAVENOUS

## 2023-11-14 MED ORDER — ONDANSETRON HCL 4 MG/2ML IJ SOLN: 4.0000 mg | Freq: Four times a day (QID) | INTRAMUSCULAR | Status: DC | PRN

## 2023-11-14 MED ORDER — ACETAMINOPHEN 500 MG PO TABS
1000.0000 mg | ORAL_TABLET | Freq: Once | ORAL | Status: AC
  Administered 2023-11-14: 1000 mg via ORAL
  Filled 2023-11-14: qty 2

## 2023-11-14 MED ORDER — ALBUMIN HUMAN 5 % IV SOLN
INTRAVENOUS | Status: AC
  Filled 2023-11-14: qty 250

## 2023-11-14 MED ORDER — MIDAZOLAM HCL 2 MG/2ML IJ SOLN
INTRAMUSCULAR | Status: AC
  Filled 2023-11-14: qty 2

## 2023-11-14 MED ORDER — SODIUM CHLORIDE 0.9 % IV SOLN: INTRAVENOUS | Status: DC

## 2023-11-14 MED ORDER — DROPERIDOL 2.5 MG/ML IJ SOLN: 0.6250 mg | Freq: Once | INTRAMUSCULAR | Status: DC | PRN

## 2023-11-14 MED ORDER — PROPOFOL 10 MG/ML IV BOLUS
INTRAVENOUS | Status: DC | PRN
  Administered 2023-11-14: 120 ug/kg/min via INTRAVENOUS
  Administered 2023-11-14: 100 mg via INTRAVENOUS

## 2023-11-14 MED ORDER — TAMSULOSIN HCL 0.4 MG PO CAPS
0.4000 mg | ORAL_CAPSULE | Freq: Once | ORAL | Status: AC
  Administered 2023-11-14: 0.4 mg via ORAL
  Filled 2023-11-14: qty 1

## 2023-11-14 MED ORDER — SUCCINYLCHOLINE CHLORIDE 200 MG/10ML IV SOSY
PREFILLED_SYRINGE | INTRAVENOUS | Status: AC
  Filled 2023-11-14: qty 10

## 2023-11-14 MED ORDER — ALBUMIN NICU 25% IV SOLUTION
INTRAVENOUS | Status: AC
  Filled 2023-11-14: qty 100

## 2023-11-14 MED ORDER — ACETAMINOPHEN 650 MG RE SUPP: 650.0000 mg | Freq: Four times a day (QID) | RECTAL | Status: DC | PRN

## 2023-11-14 MED ORDER — MIDAZOLAM HCL 5 MG/5ML IJ SOLN
INTRAMUSCULAR | Status: DC | PRN
  Administered 2023-11-14: 2 mg via INTRAVENOUS

## 2023-11-14 MED ORDER — SODIUM CHLORIDE 0.9 % IV SOLN
2.0000 g | INTRAVENOUS | Status: DC
  Filled 2023-11-14: qty 20

## 2023-11-14 MED ORDER — DEXMEDETOMIDINE HCL IN NACL 80 MCG/20ML IV SOLN
INTRAVENOUS | Status: DC | PRN
Start: 2023-11-14 — End: 2023-11-14
  Administered 2023-11-14: 8 ug via INTRAVENOUS

## 2023-11-14 MED ORDER — SODIUM CHLORIDE 0.9 % IV SOLN
2.0000 g | Freq: Once | INTRAVENOUS | Status: AC
  Administered 2023-11-14: 2 g via INTRAVENOUS
  Filled 2023-11-14: qty 20

## 2023-11-14 MED ORDER — DEXAMETHASONE SODIUM PHOSPHATE 10 MG/ML IJ SOLN
INTRAMUSCULAR | Status: DC | PRN
  Administered 2023-11-14: 5 mg via INTRAVENOUS

## 2023-11-14 MED ORDER — ACETAMINOPHEN 325 MG PO TABS: 650.0000 mg | ORAL_TABLET | Freq: Four times a day (QID) | ORAL | Status: DC | PRN

## 2023-11-14 MED ORDER — KETOROLAC TROMETHAMINE 15 MG/ML IJ SOLN: 15.0000 mg | Freq: Three times a day (TID) | INTRAMUSCULAR | Status: DC | PRN

## 2023-11-14 MED ORDER — SODIUM CHLORIDE 0.9 % IV BOLUS
1000.0000 mL | Freq: Once | INTRAVENOUS | Status: AC
Start: 2023-11-14 — End: 2023-11-14
  Administered 2023-11-14: 1000 mL via INTRAVENOUS

## 2023-11-14 MED ORDER — ENOXAPARIN SODIUM 60 MG/0.6ML IJ SOSY
50.0000 mg | PREFILLED_SYRINGE | INTRAMUSCULAR | Status: DC
Start: 2023-11-14 — End: 2023-11-15
  Administered 2023-11-14: 50 mg via SUBCUTANEOUS
  Filled 2023-11-14: qty 0.6

## 2023-11-14 MED ORDER — FENTANYL CITRATE (PF) 100 MCG/2ML IJ SOLN
INTRAMUSCULAR | Status: DC | PRN
  Administered 2023-11-14 (×2): 50 ug via INTRAVENOUS

## 2023-11-14 MED ORDER — KETOROLAC TROMETHAMINE 30 MG/ML IJ SOLN
15.0000 mg | Freq: Once | INTRAMUSCULAR | Status: AC
  Administered 2023-11-14: 15 mg via INTRAVENOUS
  Filled 2023-11-14: qty 1

## 2023-11-14 MED ORDER — LACTATED RINGERS IV SOLN: INTRAVENOUS | Status: DC

## 2023-11-14 MED ORDER — TRAMADOL HCL 50 MG PO TABS
50.0000 mg | ORAL_TABLET | Freq: Four times a day (QID) | ORAL | Status: DC | PRN
Start: 2023-11-14 — End: 2023-11-15

## 2023-11-14 MED ORDER — INSULIN ASPART 100 UNIT/ML IJ SOLN: 0.0000 [IU] | Freq: Three times a day (TID) | INTRAMUSCULAR | Status: DC

## 2023-11-14 MED ORDER — ONDANSETRON HCL 4 MG PO TABS: 4.0000 mg | ORAL_TABLET | Freq: Four times a day (QID) | ORAL | Status: DC | PRN

## 2023-11-14 MED ORDER — LIDOCAINE HCL (PF) 2 % IJ SOLN
INTRAMUSCULAR | Status: AC
  Filled 2023-11-14: qty 5

## 2023-11-14 MED ORDER — ONDANSETRON HCL 4 MG/2ML IJ SOLN
INTRAMUSCULAR | Status: AC
  Filled 2023-11-14: qty 2

## 2023-11-14 MED ORDER — METHOCARBAMOL 1000 MG/10ML IJ SOLN: 500.0000 mg | Freq: Four times a day (QID) | INTRAMUSCULAR | Status: DC | PRN

## 2023-11-14 MED ORDER — SENNOSIDES-DOCUSATE SODIUM 8.6-50 MG PO TABS: 1.0000 | ORAL_TABLET | Freq: Every evening | ORAL | Status: DC | PRN

## 2023-11-14 MED ORDER — HYDROMORPHONE HCL 1 MG/ML IJ SOLN
1.0000 mg | Freq: Once | INTRAMUSCULAR | Status: AC
  Administered 2023-11-14: 1 mg via INTRAVENOUS
  Filled 2023-11-14: qty 1

## 2023-11-14 MED ORDER — SUCCINYLCHOLINE CHLORIDE 200 MG/10ML IV SOSY
PREFILLED_SYRINGE | INTRAVENOUS | Status: DC | PRN
  Administered 2023-11-14: 100 mg via INTRAVENOUS

## 2023-11-14 MED ORDER — DEXAMETHASONE SODIUM PHOSPHATE 10 MG/ML IJ SOLN
INTRAMUSCULAR | Status: AC
  Filled 2023-11-14: qty 1

## 2023-11-14 MED ORDER — CEFAZOLIN SODIUM-DEXTROSE 2-3 GM-%(50ML) IV SOLR
INTRAVENOUS | Status: DC | PRN
  Administered 2023-11-14: 2 g via INTRAVENOUS

## 2023-11-14 SURGICAL SUPPLY — 24 items
ADHESIVE MASTISOL STRL (MISCELLANEOUS) IMPLANT
BAG DRAIN SIEMENS DORNER NS (MISCELLANEOUS) ×2 IMPLANT
BAG PRESSURE INF REUSE 3000 (BAG) ×2 IMPLANT
BASKET ZERO TIP 1.9FR (BASKET) IMPLANT
BRUSH SCRUB EZ 4% CHG (MISCELLANEOUS) ×2 IMPLANT
CATH URET FLEX-TIP 2 LUMEN 10F (CATHETERS) IMPLANT
CATH URETL OPEN 5X70 (CATHETERS) ×1 IMPLANT
DRAPE UTILITY 15X26 TOWEL STRL (DRAPES) ×2 IMPLANT
DRSG TEGADERM 2-3/8X2-3/4 SM (GAUZE/BANDAGES/DRESSINGS) IMPLANT
FIBER LASER MOSES 200 DFL (Laser) IMPLANT
GLOVE BIOGEL PI IND STRL 7.0 (GLOVE) ×2 IMPLANT
GOWN STRL REUS W/ TWL LRG LVL3 (GOWN DISPOSABLE) ×4 IMPLANT
GUIDEWIRE STR DUAL SENSOR (WIRE) ×2 IMPLANT
KIT TURNOVER CYSTO (KITS) ×2 IMPLANT
PACK CYSTO AR (MISCELLANEOUS) ×2 IMPLANT
SET CYSTO W/LG BORE CLAMP LF (SET/KITS/TRAYS/PACK) ×2 IMPLANT
SHEATH NAVIGATOR HD 12/14X36 (SHEATH) IMPLANT
SOL .9 NS 3000ML IRR UROMATIC (IV SOLUTION) ×2 IMPLANT
STENT URET 6FRX24 CONTOUR (STENTS) ×1 IMPLANT
STENT URET 6FRX26 CONTOUR (STENTS) IMPLANT
SURGILUBE 2OZ TUBE FLIPTOP (MISCELLANEOUS) ×2 IMPLANT
SYR 10ML LL (SYRINGE) ×2 IMPLANT
VALVE UROSEAL ADJ ENDO (VALVE) IMPLANT
WATER STERILE IRR 500ML POUR (IV SOLUTION) ×2 IMPLANT

## 2023-11-14 NOTE — Telephone Encounter (Signed)
  FYI Only or Action Required?: FYI only for provider.  Patient was last seen in primary care on 10/17/2023 by Randeen Laine LABOR, MD.  Called Nurse Triage reporting Abdominal Pain.  Symptoms began today.  Interventions attempted: Nothing.  Symptoms are: gradually worsening.  Triage Disposition: Go to ED Now (Notify PCP)  Patient/caregiver understands and will follow disposition?: Yes   Copied from CRM [id number]. Topic: Clinical - Red Word Triage >> Nov 14, 2023  9:25 AM Mackenzie Bonilla wrote: [facility] that prompted transfer to Nurse Triage: Patient is having stomach pains, feeling sick on the stomach, feeling hot and vomited. Started last night Reason for Disposition  [1] SEVERE pain (e.g., excruciating) AND [2] present > 1 hour  Answer Assessment - Initial Assessment Questions 1. LOCATION: Where does it hurt?      stomach 2. RADIATION: Does the pain shoot anywhere else? (e.g., chest, back)     denies 3. ONSET: When did the pain begin? (e.g., minutes, hours or days ago)      Last night 4. SUDDEN: Gradual or sudden onset?     suddenly 5. PATTERN Does the pain come and go, or is it constant?     intermittent 6. SEVERITY: How bad is the pain?  (e.g., Scale 1-10; mild, moderate, or severe)     moderate 7. RECURRENT SYMPTOM: Have you ever had this type of stomach pain before? If Yes, ask: When was the last time? and What happened that time?      Yes, with Bonilla kidney stone 8. CAUSE: What do you think is causing the stomach pain? (e.g., gallstones, recent abdominal surgery)     unknown 9. RELIEVING/AGGRAVATING FACTORS: What makes it better or worse? (e.g., antacids, bending or twisting motion, bowel movement)     denies 10. OTHER SYMPTOMS: Do you have any other symptoms? (e.g., back pain, diarrhea, fever, urination pain, vomiting)       Nausea, vomiting, sweating, feeling hot 11. PREGNANCY: Is there any chance you are pregnant? When was your last menstrual period?        na  Protocols used: Abdominal Pain - Female-Bonilla-AH

## 2023-11-14 NOTE — Progress Notes (Signed)
 PHARMACIST - PHYSICIAN COMMUNICATION  CONCERNING:  Enoxaparin (Lovenox) for DVT Prophylaxis    RECOMMENDATION: Patient was prescribed enoxaprin 40mg  q24 hours for VTE prophylaxis.   Filed Weights   11/14/23 1031  Weight: 99.8 kg (220 lb)    Body mass index is 37.76 kg/m.  Estimated Creatinine Clearance: 67 mL/min (A) (by C-G formula based on SCr of 1.05 mg/dL (H)).   Based on [facility] policy patient is candidate for enoxaparin 0.5mg /kg TBW SQ every 24 hours based on BMI being >30.   DESCRIPTION: Pharmacy has adjusted enoxaparin dose per [facility] policy.  Patient is now receiving enoxaparin 50 mg every 24 hours    Kayla JULIANNA Blew, PharmD Clinical Pharmacist  11/14/2023 3:08 PM

## 2023-11-14 NOTE — Consult Note (Signed)
 Urology Consult   I have been asked to see the patient by Dr. Claudene, for evaluation and management of obstructed ureteral stone w/ infection.  Chief Complaint: acute left flank pain  HPI:  Mackenzie Bonilla is a 58 y.o. year old presented to ED 11/14/2023 Acute left flank pain CT stone (11/14/23) - 3 mm LEFT UVJ stone, moderate hydroureteronephrosis, partial Left staghorn at [facility] - infected- Large LE, neg nitrite, 21-50 WBC, cloudy w/ bacteria  Cr 1.05 WBC 14 HR 123, 101/87  Ongoing left flank pain although improved with Toradol  She does not recall any recent general anesthesia N.p.o. > 6 hours  Previously followed by Dr. Penne in 2023 History of recurrent UTIs History of morbid obesity (BMI 38), claustrophobia, fibromyalgia, recurrent UTI, PCOS      PMH: Past Medical History:  Diagnosis Date   Allergy    allergic rhinitis   Anemia    Anxiety    Arthritis    osteoarthritis   Asthma    Claustrophobia    does not like oxygen mask covering face   Depression    Diabetes mellitus without complication (HCC)    Fatty liver 07/2008   abd. ultrasound - fatty liver ; slt dilated cbd (no stones ) 06/10// abd.  ultrasound normal on 04/2006   Fibromyalgia    GERD (gastroesophageal reflux disease)    EGD negative 06/2001// EGD erythematous mucosa, polyp 08/2008   High uric acid in 24 hour urine specimen    Hyperhidrosis    Hyperlipidemia    Kidney stones    Lymphedema    Obesity    PCOS (polycystic ovarian syndrome)    Tachycardia    TMJ (dislocation of temporomandibular joint)     Surgical History:   Allergies:  Allergies  Allergen Reactions   Cephalexin    Codeine     rash   Duloxetine    Levaquin  [Levofloxacin ] Nausea Only   Lisinopril Cough   Metformin  And Related Diarrhea    GI side effects    Quetiapine Other (See Comments)   Sertraline Hcl     REACTION: vivid dreams   Triazolam     REACTION: ? reaction   Zaleplon    Zocor [Simvastatin -  High Dose]     achey   Zolpidem Tartrate     hallucinations   Zyrtec [Cetirizine]     Sedation    Hydrocodone  Itching and Rash    REACTION: redness rash itching   Norelgestromin-Eth Estradiol      Patch didn't stick to skin    Family History: Family History  Problem Relation Age of Onset   Hypertension Father    Cancer Father        pancreatic cancer   Hypertension Sister    Heart disease Maternal Grandmother 75       MI   Diabetes Maternal Grandmother    Heart disease Paternal Grandmother 10       MI   Diabetes Paternal Grandmother    Breast cancer Neg Hx     Social History:  reports that she has never smoked. She has never used smokeless tobacco. She reports that she does not drink alcohol and does not use drugs.  ROS: Negative aside from those stated in the HPI.  Physical Exam: BP 101/87   Pulse (!) 123   Temp 100.1 F (37.8 C) (Oral)   Resp (!) 23   Ht 5' 4 (1.626 m)   Wt 99.8 kg   SpO2  98%   BMI 37.76 kg/m    Constitutional:  Alert and oriented, No acute distress. Cardiovascular: No clubbing, cyanosis, or edema. Respiratory: Normal respiratory effort, no increased work of breathing. GI: Abdomen is soft, nontender, nondistended, no abdominal masses Lymph: No cervical or inguinal lymphadenopathy. Skin: No rashes, bruises or suspicious lesions. Neurologic: Grossly intact, no focal deficits, moving all 4 extremities. Psychiatric: Normal mood and affect.   Pertinent Imaging: I have personally reviewed the CT stone (10-25)-mild to moderate left hydroureteronephrosis, 3 mm left UVJ stone.  Left partial staghorn stone sitting at [facility].  Mild left renal atrophy compared to right.  Right kidney morphologically normal, no hydro, no nephrolithiasis.  Assessment & Plan:   Obstructive 3 mm left UVJ stone + large left partial staghorn With active infection, possible early sepsis  Recommendations: -Proceed to the OR for cystoscopy, left ureteral stent placement   -  I reviewed the procedure with her in detail including perioperative pathway, expected outcomes, recovery and possible complications - Expect admission to medical service postop, admission for IV antibiotics, culture data  Penne JONELLE Skye, MD   [facility] [address], [address] [place], [place] [address] [phone]

## 2023-11-14 NOTE — Anesthesia Preprocedure Evaluation (Signed)
 Anesthesia Evaluation  Patient identified by MRN, date of birth, ID band Patient awake    Reviewed: Allergy & Precautions, NPO status , Patient's Chart, lab work & pertinent test results  History of Anesthesia Complications Negative for: history of anesthetic complications  Airway Mallampati: III  TM Distance: >3 FB Neck ROM: full    Dental  (+) Chipped, Dental Advidsory Given, Caps   Pulmonary shortness of breath and with exertion, asthma , neg sleep apnea, neg COPD, neg recent URI   Pulmonary exam normal        Cardiovascular (-) hypertension(-) angina + Peripheral Vascular Disease  (-) Past MI and (-) Cardiac Stents Normal cardiovascular exam(-) dysrhythmias (-) Valvular Problems/Murmurs     Neuro/Psych neg Seizures PSYCHIATRIC DISORDERS Anxiety Depression     Neuromuscular disease    GI/Hepatic Neg liver ROS,GERD  Medicated and Controlled,,NAFLD   Endo/Other  diabetes, Well Controlled, Type 2  Class 3 obesity  Renal/GU Renal disease (kidney stones and CKD stage 2)  negative genitourinary   Musculoskeletal  (+) Arthritis ,  Fibromyalgia -, narcotic dependent  Abdominal   Peds  Hematology  (+) Blood dyscrasia, anemia   Anesthesia Other Findings Past Medical History: No date: Allergy     Comment:  allergic rhinitis No date: Anemia No date: Anxiety No date: Arthritis     Comment:  osteoarthritis No date: Asthma No date: Claustrophobia     Comment:  does not like oxygen mask covering face No date: Depression No date: Diabetes mellitus without complication (HCC) 2016: Fall 07/2008: Fatty liver     Comment:  abd. ultrasound - fatty liver ; slt dilated cbd (no               stones ) 06/10// abd.  ultrasound normal on 04/2006 No date: Fibromyalgia No date: GERD (gastroesophageal reflux disease)     Comment:  EGD negative 06/2001// EGD erythematous mucosa, polyp               08/2008 No date: High uric acid in  24 hour urine specimen No date: Hyperhidrosis No date: Hyperlipidemia No date: Kidney stones No date: Lymphedema No date: Obesity No date: PCOS (polycystic ovarian syndrome) No date: Shortness of breath dyspnea No date: Tachycardia No date: TMJ (dislocation of temporomandibular joint) No date: UTI (lower urinary tract infection)  Past Surgical History: 2016: BREAST BIOPSY; Right     Comment:  neg 08/02/2014: BREAST LUMPECTOMY WITH RADIOACTIVE SEED LOCALIZATION; Right     Comment:  Procedure: BREAST LUMPECTOMY WITH RADIOACTIVE SEED               LOCALIZATION;  Surgeon: Donnice Bury, MD;  Location:              [facility] OR;  Service: General;  Laterality: Right; 08/12/2012: COLONOSCOPY     Comment:  Completed by Dr. Luba, normal exam.  01/2004: ENDOMETRIAL BIOPSY No date: ESOPHAGOGASTRODUODENOSCOPY 08/18/2019: ESOPHAGOGASTRODUODENOSCOPY (EGD) WITH PROPOFOL ; N/A     Comment:  Procedure: ESOPHAGOGASTRODUODENOSCOPY (EGD) WITH               PROPOFOL ;  Surgeon: Jinny Carmine, MD;  Location: [facility]               ENDOSCOPY;  Service: Endoscopy;  Laterality: N/A; No date: FOOT SURGERY No date: SEPTOPLASTY     Comment:  two times No date: TONSILLECTOMY 06/2005: UTERINE FIBROID SURGERY     Comment:  ablation 09/2001: uterine tumor No date: VAGUS NERVE STIMULATOR INSERTION  BMI  Body Mass Index: 45.03 kg/m      Reproductive/Obstetrics negative OB ROS                              Anesthesia Physical Anesthesia Plan  ASA: 3  Anesthesia Plan: General   Post-op Pain Management: Minimal or no pain anticipated   Induction: Intravenous, Rapid sequence and Cricoid pressure planned  PONV Risk Score and Plan: 2 and Ondansetron , Dexamethasone , Midazolam and Treatment may vary due to age or medical condition  Airway Management Planned: Oral ETT  Additional Equipment:   Intra-op Plan:   Post-operative Plan: Extubation in OR  Informed Consent: I have  reviewed the patients History and Physical, chart, labs and discussed the procedure including the risks, benefits and alternatives for the proposed anesthesia with the patient or authorized representative who has indicated his/her understanding and acceptance.     Dental Advisory Given  Plan Discussed with: Anesthesiologist, CRNA and Surgeon  Anesthesia Plan Comments: (Patient consented for risks of anesthesia including but not limited to:  - adverse reactions to medications - risk of airway placement if required - damage to eyes, teeth, lips or other oral mucosa - nerve damage due to positioning  - sore throat or hoarseness - Damage to heart, brain, nerves, lungs, other parts of body or loss of life  Patient voiced understanding.)        Anesthesia Quick Evaluation

## 2023-11-14 NOTE — Transfer of Care (Signed)
 Immediate Anesthesia Transfer of Care Note  Patient: Mackenzie Bonilla  Procedure(s) Performed: CYSTOSCOPY, WITH RETROGRADE PYELOGRAM AND URETERAL STENT INSERTION (Left: Ureter)  Patient Location: PACU  Anesthesia Type:General  Level of Consciousness: drowsy and patient cooperative  Airway & Oxygen Therapy: Patient Spontanous Breathing and Patient connected to nasal cannula oxygen  Post-op Assessment: Report given to RN and Post -op Vital signs reviewed and stable  Post vital signs: Reviewed and stable  Last Vitals:  Vitals Value Taken Time  BP 100/60 11/14/23 18:45  Temp 36.8 C 11/14/23 18:40  Pulse 102 11/14/23 18:47  Resp 21 11/14/23 18:47  SpO2 96 % 11/14/23 18:47  Vitals shown include unfiled device data.  Last Pain:  Vitals:   11/14/23 1840  TempSrc:   PainSc: Asleep         Complications: No notable events documented.

## 2023-11-14 NOTE — H&P (Addendum)
 History and Physical    Patient: Mackenzie Bonilla FMW:[id number] DOB: 1965-05-30 DOA: 11/14/2023 DOS: the patient was seen and examined on 11/14/2023 PCP: Randeen Laine LABOR, MD  Patient coming from: Home  Chief Complaint:  Chief Complaint  Patient presents with   Abdominal Pain   HPI: Mackenzie Bonilla is a 58 y.o. female with medical history significant of  T2DM, HTN, prior kidney stones Presents to the emergency department for evaluation of sharp, intermittent, left sided flank pain that radiates around to her lower abdomen with associated fever, diaphoresis, rigors, and urinary frequency that started yesterday.   In the ED she was non-toxic appearing, tachycardic up to 123, Tmax 100.1, blood pressure recorded 101/87, but systolic >190 during my encounter.  Leukocytosis of 13.9.  UA indicative of UTI. CT with mild left hydroureteronephrosis with 3 mm calculus seen at the left UVJ. Plan for stent placement later today with Urology.  Review of Systems: Review of Systems  Constitutional:  Positive for chills, diaphoresis and fever. Negative for malaise/fatigue and weight loss.  Respiratory:  Negative for cough, sputum production and shortness of breath.   Cardiovascular:  Negative for chest pain, palpitations and leg swelling.  Gastrointestinal:  Positive for constipation. Negative for abdominal pain, diarrhea, nausea and vomiting.  Genitourinary:  Positive for flank pain, frequency and urgency. Negative for dysuria and hematuria.  Neurological:  Negative for sensory change, speech change and focal weakness.  Psychiatric/Behavioral:  Negative for substance abuse and suicidal ideas.     Past Medical History:  Diagnosis Date   Allergy    allergic rhinitis   Anemia    Anxiety    Arthritis    osteoarthritis   Asthma    Claustrophobia    does not like oxygen mask covering face   Depression    Diabetes mellitus without complication (HCC)    Fatty liver 07/2008   abd. ultrasound -  fatty liver ; slt dilated cbd (no stones ) 06/10// abd.  ultrasound normal on 04/2006   Fibromyalgia    GERD (gastroesophageal reflux disease)    EGD negative 06/2001// EGD erythematous mucosa, polyp 08/2008   High uric acid in 24 hour urine specimen    Hyperhidrosis    Hyperlipidemia    Kidney stones    Lymphedema    Obesity    PCOS (polycystic ovarian syndrome)    Tachycardia    TMJ (dislocation of temporomandibular joint)    Past Surgical History:  Procedure Laterality Date   BIOPSY  11/01/2022   Procedure: BIOPSY;  Surgeon: Jinny Carmine, MD;  Location: [facility] ENDOSCOPY;  Service: Endoscopy;;   BREAST BIOPSY Right 2016   neg   BREAST LUMPECTOMY WITH RADIOACTIVE SEED LOCALIZATION Right 08/02/2014   Procedure: BREAST LUMPECTOMY WITH RADIOACTIVE SEED LOCALIZATION;  Surgeon: Donnice Bury, MD;  Location: [facility] OR;  Service: General;  Laterality: Right;   COLONOSCOPY  08/12/2012   Completed by Dr. Luba, normal exam.    COLONOSCOPY WITH PROPOFOL  N/A 11/01/2022   Procedure: COLONOSCOPY WITH PROPOFOL ;  Surgeon: Jinny Carmine, MD;  Location: [facility] ENDOSCOPY;  Service: Endoscopy;  Laterality: N/A;   ENDOMETRIAL BIOPSY  01/2004   ESOPHAGOGASTRODUODENOSCOPY     ESOPHAGOGASTRODUODENOSCOPY (EGD) WITH PROPOFOL  N/A 08/18/2019   Procedure: ESOPHAGOGASTRODUODENOSCOPY (EGD) WITH PROPOFOL ;  Surgeon: Jinny Carmine, MD;  Location: [facility] ENDOSCOPY;  Service: Endoscopy;  Laterality: N/A;   ESOPHAGOGASTRODUODENOSCOPY (EGD) WITH PROPOFOL  N/A 11/01/2022   Procedure: ESOPHAGOGASTRODUODENOSCOPY (EGD) WITH PROPOFOL ;  Surgeon: Jinny Carmine, MD;  Location: [facility] ENDOSCOPY;  Service: Endoscopy;  Laterality: N/A;   FOOT SURGERY     SEPTOPLASTY     two times   TONSILLECTOMY     UTERINE FIBROID SURGERY  06/2005   ablation   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 She has never used smokeless tobacco. She reports that she does not drink alcohol  and does not use drugs.  Allergies  Allergen Reactions   Cephalexin    Codeine     rash   Duloxetine    Levaquin  [Levofloxacin ] Nausea Only   Lisinopril Cough   Metformin  And Related Diarrhea    GI side effects    Quetiapine Other (See Comments)   Sertraline Hcl     REACTION: vivid dreams   Triazolam     REACTION: ? reaction   Zaleplon    Zocor [Simvastatin - High Dose]     achey   Zolpidem Tartrate     hallucinations   Zyrtec [Cetirizine]     Sedation    Hydrocodone  Itching and Rash    REACTION: redness rash itching   Norelgestromin-Eth Estradiol      Patch didn't stick to skin    Family History  Problem Relation Age of Onset   Hypertension Father    Cancer Father        pancreatic cancer   Hypertension Sister    Heart disease Maternal Grandmother 34       MI   Diabetes Maternal Grandmother    Heart disease Paternal Grandmother 3       MI   Diabetes Paternal Grandmother    Breast cancer Neg Hx     Prior to Admission medications   Medication Sig Start Date End Date Taking? Authorizing Provider  acetaminophen  (TYLENOL ) 500 MG tablet Take 500 mg by mouth every 6 (six) hours as needed.     [provider]  albuterol  (VENTOLIN  HFA) 108 (90 Base) MCG/ACT inhaler INHALE 2 PUFFS INTO THE LUNGS EVERY 6 HOURS AS NEEDED FOR WHEEZING. 09/24/23   Tower, Laine LABOR, MD  ALPRAZolam (XANAX) 1 MG tablet Take 2 mg by mouth at bedtime.    [provider]  ammonium lactate  (AMLACTIN) 12 % cream Apply 1 Application topically as needed for dry skin. 12/18/22   Jackquline Sawyer, MD  BD PEN NEEDLE NANO U/F 32G X 4 MM MISC daily. 03/04/23   [provider]  Blood Glucose Monitoring Suppl (FIFTY50 GLUCOSE METER 2.0) w/Device KIT Use as directed 07/02/18   [provider]  cyclobenzaprine  (FLEXERIL ) 5 MG tablet TAKE 1 TABLET BY MOUTH 3 TIMES DAILY AS NEEDED FOR MUSCLE SPASMS 03/14/23   Tower, Laine LABOR, MD  desvenlafaxine (PRISTIQ) 50 MG 24 hr tablet Take 50 mg by  mouth daily.    [provider]  dimenhyDRINATE (DRAMAMINE) 50 MG tablet Take 50 mg by mouth every 8 (eight) hours as needed.    [provider]  docusate sodium  (COLACE) 100 MG capsule TAKE ONE CAPSULE BY MOUTH TWICE DAILY. 09/10/23   Tower, Laine LABOR, MD  famotidine  (PEPCID ) 20 MG tablet Take 1 tablet (20 mg total) by mouth 2 (two) times daily as needed for heartburn or indigestion (for acid reflux). 10/17/23   Tower, Laine LABOR, MD  FLUoxetine (PROZAC) 20 MG capsule Take 20 mg by mouth daily. 01/03/23   [provider]  fluticasone  (FLONASE ) 50 MCG/ACT nasal spray Place into both nostrils daily.    [provider]  gabapentin  (NEURONTIN ) 100 MG capsule Take 1 capsule (100 mg total) by mouth daily. With breakfast 06/04/23   Tower, Laine LABOR, MD  gabapentin  (NEURONTIN ) 300 MG capsule Take 1 capsule (300 mg total) by mouth at bedtime. 10/02/23   Tower, Laine LABOR, MD  hydrocortisone  (ANUSOL -HC) 2.5 % rectal cream Apply to affected (hemorrhoid area) once daily at bedtime as needed for no more than 10 days in a row 07/17/23   Tower, Laine LABOR, MD  ketoconazole  (NIZORAL ) 2 % cream Apply 1 Application topically daily as needed for irritation. 09/03/22   Tower, Laine LABOR, MD  linaclotide  (LINZESS ) 145 MCG CAPS capsule Take 1 capsule (145 mcg total) by mouth every other day. 10/02/23   Tower, Laine LABOR, MD  linaclotide  (LINZESS ) 290 MCG CAPS capsule TAKE 1 CAPSULE BY MOUTH ONCE DAILY BEFORE BREAKFAST 09/24/23   Tower, Laine LABOR, MD  losartan (COZAAR) 25 MG tablet  05/23/17   [provider]  minoxidil  (LONITEN ) 2.5 MG tablet Take 1 tablet (2.5 mg total) by mouth daily. 06/18/23   Jackquline Sawyer, MD  mometasone  (ELOCON ) 0.1 % cream Apply to affected area left thigh twice daily as needed for flares/itch. Avoid applying to face, groin, and axilla. Use as directed. Long-term use can cause thinning of the skin. 06/18/23   Stewart, Tara, MD  MOUNJARO 12.5 MG/0.5ML Pen Inject 12.5 mg into the skin once  a week. 08/07/22   [provider]  omeprazole  (PRILOSEC) 40 MG capsule Take 1 capsule (40 mg total) by mouth in the morning and at bedtime. 07/17/23   Tower, Laine LABOR, MD  oxybutynin  (DITROPAN  XL) 15 MG 24 hr tablet Take 1 tablet (15 mg total) by mouth daily. 10/17/23   Tower, Laine LABOR, MD  Polyethyl Glycol-Propyl Glycol (SYSTANE OP) Apply 1 drop to eye daily as needed.    [provider]  pravastatin  (PRAVACHOL ) 20 MG tablet Take 1 tablet (20 mg total) by mouth at bedtime. 10/17/23   Tower, Laine LABOR, MD  Simethicone (GAS-X PO) Take by mouth as needed.    [provider]  spironolactone  (ALDACTONE ) 100 MG tablet TAKE 1 TABLET BY MOUTH DAILY 08/23/23   Tower, Laine LABOR, MD  traMADol  (ULTRAM ) 50 MG tablet Take 1 tablet (50 mg total) by mouth every 12 (twelve) hours as needed for severe pain (pain score 7-10). Caution of sedation 01/25/23   Tower, Laine LABOR, MD  traZODone (DESYREL) 150 MG tablet Take 300 mg by mouth at bedtime. 05/20/14   [provider]  TRESIBA FLEXTOUCH 100 UNIT/ML FlexTouch Pen Inject 36 Units into the skin daily. 03/09/22   [provider]  zinc oxide (BALMEX) 11.3 % CREA cream Apply 1 application topically 2 (two) times daily.    [provider]    Physical Exam: Vitals:   11/14/23 1041 11/14/23 1341 11/14/23 1400 11/14/23 1412  BP: 126/71  101/87   Pulse: 94  (!) 123   Resp: 18  (!) 23   Temp:  100.1 F (37.8 C)    TempSrc:  Oral    SpO2:   (!) 87% 98%  Weight:      Height:       Physical Exam Vitals and nursing note reviewed.  Constitutional:      General: She is not in acute distress.    Appearance: She is obese. She is not ill-appearing, toxic-appearing or diaphoretic.  HENT:     Head: Normocephalic and atraumatic.  Eyes:     Extraocular Movements:  Extraocular movements intact.     Pupils: Pupils are equal, round, and reactive to light.  Cardiovascular:     Rate and Rhythm: Regular rhythm. Tachycardia present.   Pulmonary:     Effort: Pulmonary effort is normal. No respiratory distress.     Breath sounds: Normal breath sounds. No wheezing.  Abdominal:     General: Bowel sounds are normal. There is no distension.     Palpations: Abdomen is soft.     Tenderness: There is left CVA tenderness. There is no right CVA tenderness.  Skin:    General: Skin is warm and dry.     Capillary Refill: Capillary refill takes less than 2 seconds.  Neurological:     General: No focal deficit present.     Mental Status: She is alert and oriented to person, place, and time.  Psychiatric:        Mood and Affect: Mood normal.        Behavior: Behavior normal.     Data Reviewed:   Labs on Admission: I have personally reviewed following labs and imaging studies  CBC: Recent Labs  Lab 11/14/23 1042  WBC 13.9*  HGB 15.2*  HCT 45.7  MCV 95.4  PLT 208   Basic Metabolic Panel: Recent Labs  Lab 11/14/23 1042  NA 139  K 3.7  CL 103  CO2 24  GLUCOSE 112*  BUN 19  CREATININE 1.05*  CALCIUM 9.5   GFR: Estimated Creatinine Clearance: 67 mL/min (A) (by C-G formula based on SCr of 1.05 mg/dL (H)). Liver Function Tests: Recent Labs  Lab 11/14/23 1042  AST 22  ALT 16  ALKPHOS 73  BILITOT 0.8  PROT 7.4  ALBUMIN 4.2   No results for input(s): LIPASE, AMYLASE in the last 168 hours. No results for input(s): AMMONIA in the last 168 hours. Coagulation Profile: No results for input(s): INR, PROTIME in the last 168 hours. Cardiac Enzymes: No results for input(s): CKTOTAL, CKMB, CKMBINDEX, TROPONINI in the last 168 hours. BNP (last 3 results) No results for input(s): PROBNP in the last 8760 hours. HbA1C: No results for input(s): HGBA1C in the last 72 hours. CBG: No results for input(s): GLUCAP in the last 168 hours. Lipid Profile: No results for input(s): CHOL, HDL, LDLCALC, TRIG, CHOLHDL, LDLDIRECT in the last 72 hours. Thyroid  Function Tests: No results for  input(s): TSH, T4TOTAL, FREET4, T3FREE, THYROIDAB in the last 72 hours. Anemia Panel: No results for input(s): VITAMINB12, FOLATE, FERRITIN, TIBC, IRON, RETICCTPCT in the last 72 hours. Urine analysis:    Component Value Date/Time   COLORURINE YELLOW (A) 11/14/2023 1105   APPEARANCEUR CLOUDY (A) 11/14/2023 1105   APPEARANCEUR Hazy (A) 01/03/2022 1522   LABSPEC 1.018 11/14/2023 1105   LABSPEC 1.035 06/06/2011 0000   PHURINE 5.0 11/14/2023 1105   GLUCOSEU NEGATIVE 11/14/2023 1105   GLUCOSEU >=500 06/06/2011 0000   HGBUR LARGE (A) 11/14/2023 1105   BILIRUBINUR NEGATIVE 11/14/2023 1105   BILIRUBINUR Negative 01/03/2022 1522   BILIRUBINUR Negative 06/06/2011 0000   KETONESUR 5 (A) 11/14/2023 1105   PROTEINUR NEGATIVE 11/14/2023 1105   UROBILINOGEN 0.2 12/21/2021 1107   NITRITE NEGATIVE 11/14/2023 1105   LEUKOCYTESUR LARGE (A) 11/14/2023 1105   LEUKOCYTESUR Negative 06/06/2011 0000    Radiological Exams on Admission: DG Chest Portable 1 View Result Date: 11/14/2023 CLINICAL DATA:  eval infiltrate, SIRS/Sepsis, left flank pain EXAM: PORTABLE CHEST - 1 VIEW COMPARISON:  12/21/2016 FINDINGS: Patchy right lung base and retrocardiac airspace opacities. No pleural effusion or  pneumothorax. Mild cardiomegaly. No acute fracture or destructive lesions. Multilevel thoracic osteophytosis. IMPRESSION: Patchy airspace opacities in the lung bases, which may reflect atelectasis or a developing bronchopneumonia. Electronically Signed   By: Rogelia Myers M.D.   On: 11/14/2023 15:12   CT Renal Stone Study Result Date: 11/14/2023 CLINICAL DATA:  Left flank pain. Nausea and vomiting. Urolithiasis. EXAM: CT ABDOMEN AND PELVIS WITHOUT CONTRAST TECHNIQUE: Multidetector CT imaging of the abdomen and pelvis was performed following the standard protocol without IV contrast. RADIATION DOSE REDUCTION: This exam was performed according to the departmental dose-optimization program which includes  automated exposure control, adjustment of the mA and/or kV according to patient size and/or use of iterative reconstruction technique. COMPARISON:  06/23/2015 FINDINGS: Lower chest: 10 mm solid nodule is seen in the right lower lobe on image 16/4, which appears increased in size from 5-6 mm previously. Hepatobiliary: No mass visualized on this unenhanced exam. Gallbladder is unremarkable. No evidence of biliary ductal dilatation. Pancreas: No mass or inflammatory process visualized on this unenhanced exam. Spleen:  Within normal limits in size. Adrenals/Urinary tract: A partial staghorn calculus is again seen in the left renal collecting system, measuring 30 x 12 mm. Mild left hydroureteronephrosis shows mild increase since previous study, with 3 mm calculus again seen at the left UVJ. Moderate diffuse left renal parenchymal atrophy has increased since previous prior study. Stomach/Bowel: No evidence of obstruction, inflammatory process, or abnormal fluid collections. Normal appendix visualized. Vascular/Lymphatic: No pathologically enlarged lymph nodes identified. No evidence of abdominal aortic aneurysm. Reproductive:  No mass or other significant abnormality. Other:  None. Musculoskeletal:  No suspicious bone lesions identified. IMPRESSION: Increased mild left hydroureteronephrosis, with 3 mm calculus again seen at the left UVJ. Increased moderate diffuse left renal parenchymal atrophy. Partial staghorn calculus again seen in left renal collecting system. 10 mm right lower lobe pulmonary nodule, increased in size since previous study. Recommend chest CT without contrast for further evaluation. Electronically Signed   By: Norleen DELENA Kil M.D.   On: 11/14/2023 13:00       Assessment and Plan: No notes have been filed under this hospital service. Service: Hospitalist 58 y.o. female with medical history significant of  T2DM, HTN, prior kidney stones presents to the emergency department with left-sided flank  pain.  Met sepsis criteria with tachycardia, tachypnea, leukocytosis, UTI.  CT with left 3mm stone at UVJ.  Plan for cystoscopy and left ureteral stent placement per urology this afternoon.  #Sepsis 2/2  #UTI  #obstructive left uretertolithiasis  - blood and urine cutlures pending  - plan for ureteral stent placement later this evening per Urology - Rocephin  2g. Prior urine cultures reviewed - IVF  - pain control  -continue telemetry, f/u EKG    #T2DM - SSI, accuchecks   #HTN - bp recorded 101/87, systolic >190 during my encounter.  hold anithypertensives for now, resume as indicated   #Pulmonary nodule , right  - Incidental finding on CT renal stone study.  Was noted to be increased in size, now 10 mm.  CT chest without contrast was recommended for further evaluation. Will defer this for now   Resume home medications when verified  NPO  LR@100  Monitor/replace electrolytes  Lovenox    Advance Care Planning:   Code Status: Full Code discussed with patient at time of admission   Consults: Urology   Severity of Illness: The appropriate patient status for this patient is INPATIENT. Inpatient status is judged to be reasonable and necessary in order to  provide the required intensity of service to ensure the patient's safety. The patient's presenting symptoms, physical exam findings, and initial radiographic and laboratory data in the context of their chronic comorbidities is felt to place them at high risk for further clinical deterioration. Furthermore, it is not anticipated that the patient will be medically stable for discharge from the hospital within 2 midnights of admission.   * I certify that at the point of admission it is my clinical judgment that the patient will require inpatient hospital care spanning beyond 2 midnights from the point of admission due to high intensity of service, high risk for further deterioration and high frequency of surveillance  required.*  Author: Daved JAYSON Pump, DO 11/14/2023 4:03 PM  For on call review [email or web address].

## 2023-11-14 NOTE — Op Note (Signed)
 Preoperative diagnosis:  Left nephrolithiasis   Postoperative diagnosis:  Left ureteral stone Left renal stones   Procedure:  Cystoscopy left ureteral stent placement left retrograde pyelography with interpretation   Surgeon: Penne Skye, MD  Anesthesia: General  Complications: None  Intraoperative findings: Successful placement of a Left 54F x 24 cm JJ ureteral stent without string Conveniently, her left UVJ stone passed into the bladder as we were performing initial cystoscopy. Stone evacuated and sent for analysis  EBL: Minimal  Specimens:  Left ureteral stone  Indication: Mackenzie Bonilla is a 58 y.o. patient with: Obstructive 3 mm left UVJ stone + large left partial staghorn With active infection, possible early sepsis  After reviewing the management options for treatment, he elected to proceed with the above surgical procedure(s). We have discussed the potential benefits and risks of the procedure, side effects of the proposed treatment, the likelihood of the patient achieving the goals of the procedure, and any potential problems that might occur during the procedure or recuperation. Informed consent has been obtained.  Description of procedure:  The patient was taken to the operating room and general anesthesia was induced.  The patient was placed in the dorsal lithotomy position, prepped and draped in the usual sterile fashion, and preoperative antibiotics were administered. A preoperative time-out was performed.   Cystourethroscopy was performed.  The patient's urethra was examined and was normal. The bladder was then systematically examined in its entirety. There was no evidence for any bladder tumors, stones, or other mucosal pathology.  Bilateral ureteral orifices noted in orthotopic location. Fortunately, her left UVJ stone peristalsed into the bladder as we were doing our initial cystoscopy. The stone was evacuated and sent for analysis. As she still  maintained a large partial staghorn renal pelvic stone, with possible obstruction, we elected to proceed with stent placement.  Attention then turned to the left ureteral orifice and a ureteral catheter was used to intubate the ureteral orifice.  Omnipaque  contrast was injected through the ureteral catheter and a retrograde pyelogram was performed with findings: normal left ureteral contours, radioopacity at left renal pelvis, mild hydronephrosis.   A sensor guidewire was then advanced up the left ureter into the renal pelvis under fluoroscopic guidance. A 54F x 24cm JJ ureteral stent without string was positioned appropriately under fluoroscopic and cystoscopic guidance.  The wire was then removed with an adequate stent curl noted in the renal pelvis as well as in the bladder.  The bladder was then emptied and the procedure ended.  The patient appeared to tolerate the procedure well and without complications.  The patient was able to be awakened and transferred to the recovery unit in satisfactory condition.   Plan: - admit for medical observation, IV antibiotics, culture data - Follow up with Urology, will need definitive stone treatment: Left URS/LL in ~2-3 weeks  Penne Skye, M.D.

## 2023-11-14 NOTE — Plan of Care (Signed)

## 2023-11-14 NOTE — ED Triage Notes (Signed)
 Pt via POV from home. Pt c/o L sided flank pain that radiates to the L side of her abd. States last night. Pt c/o NV. Denies any diarrhea. Reports she was having hot flashes throughout the night. Pt has a hx of DM II and takes Munjaro, also reports hx of kidney stone. Pt is A&Ox4 and NAD

## 2023-11-14 NOTE — ED Provider Notes (Signed)
 [facility] Provider Note    Event Date/Time   First MD Initiated Contact with Patient 11/14/23 1035     (approximate)   History   Abdominal Pain   HPI  Mackenzie Bonilla is a 58 y.o. female who presents to the ED for evaluation of Abdominal Pain   Reviewed PCP visit from 1 month ago.  Morbidly obese patient with history of GERD, chronic constipation, DM.  I reviewed bidirectional endoscopy from 1 year ago, internal hemorrhoids not bleeding, single gastric polyp removed with otherwise normal examinations.  Patient presents to the ED for evaluation of left-sided abdominal and flank discomfort and concerns for a kidney stone.  She reports abnormal colored urine last week, maybe dysuria a few days ago, and she is uncertain if she has a UTI.  No fevers at home, she reports primarily left flank pain.   Physical Exam   Triage Vital Signs: ED Triage Vitals  Encounter Vitals Group     BP 11/14/23 1032 121/83     Girls Systolic BP Percentile --      Girls Diastolic BP Percentile --      Boys Systolic BP Percentile --      Boys Diastolic BP Percentile --      Pulse Rate 11/14/23 1032 (!) 108     Resp 11/14/23 1032 18     Temp 11/14/23 1032 98.2 F (36.8 C)     Temp Source 11/14/23 1032 Oral     SpO2 11/14/23 1032 100 %     Weight 11/14/23 1031 220 lb (99.8 kg)     Height 11/14/23 1031 5' 4 (1.626 m)     Head Circumference --      Peak Flow --      Pain Score 11/14/23 1030 9     Pain Loc --      Pain Education --      Exclude from Growth Chart --     Most recent vital signs: Vitals:   11/14/23 1400 11/14/23 1412  BP: 101/87   Pulse: (!) 123   Resp: (!) 23   Temp:    SpO2: (!) 87% 98%    General: Awake, no distress.  Morbidly obese and appears uncomfortable CV:  Good peripheral perfusion.  Resp:  Normal effort.  Abd:  No distention.  Poorly localizing left-sided abdominal tenderness and left-sided CVA tenderness without peritoneal  features or guarding MSK:  No deformity noted.  Neuro:  No focal deficits appreciated. Other:     ED Results / Procedures / Treatments   Labs (all labs ordered are listed, but only abnormal results are displayed) Labs Reviewed  COMPREHENSIVE METABOLIC PANEL WITH GFR - Abnormal; Notable for the following components:      Result Value   Glucose, Bld 112 (*)    Creatinine, Ser 1.05 (*)    All other components within normal limits  CBC - Abnormal; Notable for the following components:   WBC 13.9 (*)    Hemoglobin 15.2 (*)    All other components within normal limits  URINALYSIS, ROUTINE W REFLEX MICROSCOPIC - Abnormal; Notable for the following components:   Color, Urine YELLOW (*)    APPearance CLOUDY (*)    Hgb urine dipstick LARGE (*)    Ketones, ur 5 (*)    Leukocytes,Ua LARGE (*)    Bacteria, UA MANY (*)    All other components within normal limits  URINE CULTURE  CULTURE, BLOOD (ROUTINE X 2)  CULTURE, BLOOD (  ROUTINE X 2)  LIPASE, BLOOD  HEMOGLOBIN A1C  HIV ANTIBODY (ROUTINE TESTING W REFLEX)  TROPONIN I (HIGH SENSITIVITY)    EKG   RADIOLOGY CT renal study interpreted by me with a small distal ureteral stone on the left as well as a sacral calculus proximally in the left  Official radiology report(s): DG Chest Portable 1 View Result Date: 11/14/2023 CLINICAL DATA:  eval infiltrate, SIRS/Sepsis, left flank pain EXAM: PORTABLE CHEST - 1 VIEW COMPARISON:  12/21/2016 FINDINGS: Patchy right lung base and retrocardiac airspace opacities. No pleural effusion or pneumothorax. Mild cardiomegaly. No acute fracture or destructive lesions. Multilevel thoracic osteophytosis. IMPRESSION: Patchy airspace opacities in the lung bases, which may reflect atelectasis or a developing bronchopneumonia. Electronically Signed   By: Rogelia Myers M.D.   On: 11/14/2023 15:12   CT Renal Stone Study Result Date: 11/14/2023 CLINICAL DATA:  Left flank pain. Nausea and vomiting. Urolithiasis.  EXAM: CT ABDOMEN AND PELVIS WITHOUT CONTRAST TECHNIQUE: Multidetector CT imaging of the abdomen and pelvis was performed following the standard protocol without IV contrast. RADIATION DOSE REDUCTION: This exam was performed according to the departmental dose-optimization program which includes automated exposure control, adjustment of the mA and/or kV according to patient size and/or use of iterative reconstruction technique. COMPARISON:  06/23/2015 FINDINGS: Lower chest: 10 mm solid nodule is seen in the right lower lobe on image 16/4, which appears increased in size from 5-6 mm previously. Hepatobiliary: No mass visualized on this unenhanced exam. Gallbladder is unremarkable. No evidence of biliary ductal dilatation. Pancreas: No mass or inflammatory process visualized on this unenhanced exam. Spleen:  Within normal limits in size. Adrenals/Urinary tract: A partial staghorn calculus is again seen in the left renal collecting system, measuring 30 x 12 mm. Mild left hydroureteronephrosis shows mild increase since previous study, with 3 mm calculus again seen at the left UVJ. Moderate diffuse left renal parenchymal atrophy has increased since previous prior study. Stomach/Bowel: No evidence of obstruction, inflammatory process, or abnormal fluid collections. Normal appendix visualized. Vascular/Lymphatic: No pathologically enlarged lymph nodes identified. No evidence of abdominal aortic aneurysm. Reproductive:  No mass or other significant abnormality. Other:  None. Musculoskeletal:  No suspicious bone lesions identified. IMPRESSION: Increased mild left hydroureteronephrosis, with 3 mm calculus again seen at the left UVJ. Increased moderate diffuse left renal parenchymal atrophy. Partial staghorn calculus again seen in left renal collecting system. 10 mm right lower lobe pulmonary nodule, increased in size since previous study. Recommend chest CT without contrast for further evaluation. Electronically Signed   By:  Norleen DELENA Kil M.D.   On: 11/14/2023 13:00    PROCEDURES and INTERVENTIONS:  .Critical Care  Performed by: Claudene Rover, MD Authorized by: Claudene Rover, MD   Critical care provider statement:    Critical care time (minutes):  30   Critical care time was exclusive of:  Separately billable procedures and treating other patients   Critical care was necessary to treat or prevent imminent or life-threatening deterioration of the following conditions:  Sepsis   Critical care was time spent personally by me on the following activities:  Development of treatment plan with patient or surrogate, discussions with consultants, evaluation of patient's response to treatment, examination of patient, ordering and review of laboratory studies, ordering and review of radiographic studies, ordering and performing treatments and interventions, pulse oximetry, re-evaluation of patient's condition and review of old charts .1-3 Lead EKG Interpretation  Performed by: Claudene Rover, MD Authorized by: Claudene Rover, MD     Interpretation:  abnormal     ECG rate:  118   ECG rate assessment: tachycardic     Rhythm: sinus tachycardia     Ectopy: none     Conduction: normal     Medications  enoxaparin (LOVENOX) injection 50 mg (has no administration in time range)  insulin aspart (novoLOG) injection 0-15 Units (has no administration in time range)  insulin aspart (novoLOG) injection 0-5 Units (has no administration in time range)  lactated ringers  infusion (has no administration in time range)  acetaminophen  (TYLENOL ) tablet 650 mg (has no administration in time range)    Or  acetaminophen  (TYLENOL ) suppository 650 mg (has no administration in time range)  traMADol  (ULTRAM ) tablet 50 mg (has no administration in time range)  methocarbamol (ROBAXIN) injection 500 mg (has no administration in time range)  senna-docusate (Senokot-S) tablet 1 tablet (has no administration in time range)  ondansetron  (ZOFRAN ) tablet 4  mg (has no administration in time range)    Or  ondansetron  (ZOFRAN ) injection 4 mg (has no administration in time range)  cefTRIAXone  (ROCEPHIN ) 2 g in sodium chloride  0.9 % 100 mL IVPB (has no administration in time range)  sodium chloride  0.9 % bolus 1,000 mL (1,000 mLs Intravenous New Bag/Given 11/14/23 1229)  cefTRIAXone  (ROCEPHIN ) 2 g in sodium chloride  0.9 % 100 mL IVPB (0 g Intravenous Stopped 11/14/23 1259)  HYDROmorphone  (DILAUDID ) injection 1 mg (1 mg Intravenous Given 11/14/23 1228)  ondansetron  (ZOFRAN ) injection 4 mg (4 mg Intravenous Given 11/14/23 1228)  acetaminophen  (TYLENOL ) tablet 1,000 mg (1,000 mg Oral Given 11/14/23 1338)  ketorolac  (TORADOL ) 30 MG/ML injection 15 mg (15 mg Intravenous Given 11/14/23 1339)  sodium chloride  0.9 % bolus 1,000 mL (1,000 mLs Intravenous New Bag/Given 11/14/23 1339)  tamsulosin  (FLOMAX ) capsule 0.4 mg (0.4 mg Oral Given 11/14/23 1338)     IMPRESSION / MDM / ASSESSMENT AND PLAN / ED COURSE  I reviewed the triage vital signs and the nursing notes.  Differential diagnosis includes, but is not limited to, ureteral colic, pyelonephritis or UTI, diverticulitis, MSK pain  {Patient presents with symptoms of an acute illness or injury that is potentially life-threatening.  Patient presents with symptoms of ureteral colic over the past few days, found to have a distal ureteral stone superimposed on a large or more proximal staghorn calculus.  Urine with some signs of infection, she has some clinical worsening in the ED where she spikes a fever, tachycardia.  I consult with urology who will see the patient in consultation with plans for possible ureteral stenting.  She is provided fluids, antibiotics and I consult medicine for admission.  Urine sent for culture, after her development of fever we will also send blood cultures.  Clinical Course as of 11/14/23 1521  Thu Nov 14, 2023  1327 Reassessed and discussed CT results with distal ureteral stone.  She  reports her flank pain is resolved but she reports sensation of teeth chattering and chills that just started in the past few minutes.  Checking oral temperature that is 98.9 but she had just ingested some ice chips.  I update the nurse to provide additional medications and recheck her temperature again while in there [DS]  1428 Reassessed, no recurrence of flank pain. [DS]  1433 I consult with Urology. Dr. Georganne, and discussed my concerns with patient spiking a fever, tachycardia and workup overall with signs of UTI and ureteral stone.  He reviews images and will see the patient in consultation for possible ureteral stenting and does request  medical admission. [DS]  1451 Consult medicine who agrees to admit [DS]    Clinical Course User Index [DS] Claudene Rover, MD     FINAL CLINICAL IMPRESSION(S) / ED DIAGNOSES   Final diagnoses:  Ureteral colic  Acute cystitis without hematuria     Rx / DC Orders   ED Discharge Orders     None        Note:  This document was prepared using Dragon voice recognition software and may include unintentional dictation errors.   Claudene Rover, MD 11/14/23 [phone]

## 2023-11-14 NOTE — Anesthesia Procedure Notes (Signed)
 Procedure Name: Intubation Date/Time: 11/14/2023 6:09 PM  Performed by: Lorriane Arabia, CRNAPre-anesthesia Checklist: Patient identified, Patient being monitored, Timeout performed, Emergency Drugs available and Suction available Patient Re-evaluated:Patient Re-evaluated prior to induction Oxygen Delivery Method: Circle system utilized Preoxygenation: Pre-oxygenation with 100% oxygen Induction Type: IV induction, Cricoid Pressure applied and Rapid sequence Ventilation: Mask ventilation without difficulty Laryngoscope Size: McGrath and 4 Grade View: Grade I Tube type: Oral Tube size: 7.0 mm Number of attempts: 1 Airway Equipment and Method: Stylet Placement Confirmation: ETT inserted through vocal cords under direct vision, positive ETCO2 and breath sounds checked- equal and bilateral Secured at: 21 cm Tube secured with: Tape Dental Injury: Teeth and Oropharynx as per pre-operative assessment

## 2023-11-14 NOTE — ED Notes (Signed)
Patient aware that urine sample is needed.

## 2023-11-14 NOTE — Telephone Encounter (Signed)
 Aware, will watch for correspondence Agree with ER disposition

## 2023-11-15 ENCOUNTER — Ambulatory Visit: Payer: Self-pay

## 2023-11-15 ENCOUNTER — Other Ambulatory Visit: Payer: Self-pay | Admitting: Physician Assistant

## 2023-11-15 ENCOUNTER — Inpatient Hospital Stay

## 2023-11-15 ENCOUNTER — Encounter: Payer: Self-pay | Admitting: Urology

## 2023-11-15 ENCOUNTER — Other Ambulatory Visit: Payer: Self-pay

## 2023-11-15 DIAGNOSIS — N23 Unspecified renal colic: Secondary | ICD-10-CM | POA: Diagnosis not present

## 2023-11-15 DIAGNOSIS — N3000 Acute cystitis without hematuria: Secondary | ICD-10-CM | POA: Diagnosis not present

## 2023-11-15 DIAGNOSIS — N390 Urinary tract infection, site not specified: Secondary | ICD-10-CM | POA: Diagnosis not present

## 2023-11-15 DIAGNOSIS — R918 Other nonspecific abnormal finding of lung field: Secondary | ICD-10-CM | POA: Diagnosis not present

## 2023-11-15 DIAGNOSIS — E119 Type 2 diabetes mellitus without complications: Secondary | ICD-10-CM

## 2023-11-15 DIAGNOSIS — N200 Calculus of kidney: Secondary | ICD-10-CM

## 2023-11-15 DIAGNOSIS — I700 Atherosclerosis of aorta: Secondary | ICD-10-CM | POA: Diagnosis not present

## 2023-11-15 DIAGNOSIS — I517 Cardiomegaly: Secondary | ICD-10-CM | POA: Diagnosis not present

## 2023-11-15 DIAGNOSIS — I2510 Atherosclerotic heart disease of native coronary artery without angina pectoris: Secondary | ICD-10-CM | POA: Diagnosis not present

## 2023-11-15 DIAGNOSIS — N201 Calculus of ureter: Secondary | ICD-10-CM | POA: Diagnosis not present

## 2023-11-15 LAB — BASIC METABOLIC PANEL WITH GFR
Anion gap: 8 (ref 5–15)
BUN: 20 mg/dL (ref 6–20)
CO2: 24 mmol/L (ref 22–32)
Calcium: 8.5 mg/dL — ABNORMAL LOW (ref 8.9–10.3)
Chloride: 109 mmol/L (ref 98–111)
Creatinine, Ser: 1.07 mg/dL — ABNORMAL HIGH (ref 0.44–1.00)
GFR, Estimated: >60 mL/min (ref >60)
Glucose, Bld: 119 mg/dL — ABNORMAL HIGH (ref 70–99)
Potassium: 4.9 mmol/L (ref 3.5–5.1)
Sodium: 141 mmol/L (ref 135–145)

## 2023-11-15 LAB — CBC
HCT: 35.8 % — ABNORMAL LOW (ref 36.0–46.0)
Hemoglobin: 12.1 g/dL (ref 12.0–15.0)
MCH: 33.1 pg (ref 26.0–34.0)
MCHC: 33.8 g/dL (ref 30.0–36.0)
MCV: 97.8 fL (ref 80.0–100.0)
Platelets: 151 K/uL (ref 150–400)
RBC: 3.66 MIL/uL — ABNORMAL LOW (ref 3.87–5.11)
RDW: 12.6 % (ref 11.5–15.5)
WBC: 15.1 K/uL — ABNORMAL HIGH (ref 4.0–10.5)
nRBC: 0 % (ref 0.0–0.2)

## 2023-11-15 LAB — GLUCOSE, CAPILLARY
Glucose-Capillary: 101 mg/dL — ABNORMAL HIGH (ref 70–99)
Glucose-Capillary: 81 mg/dL (ref 70–99)

## 2023-11-15 LAB — HIV ANTIBODY (ROUTINE TESTING W REFLEX): HIV Screen 4th Generation wRfx: NONREACTIVE

## 2023-11-15 MED ORDER — OXYBUTYNIN CHLORIDE ER 5 MG PO TB24
15.0000 mg | ORAL_TABLET | Freq: Every day | ORAL | Status: DC
  Administered 2023-11-15: 15 mg via ORAL
  Filled 2023-11-15: qty 1

## 2023-11-15 MED ORDER — LINACLOTIDE 145 MCG PO CAPS: 290.0000 ug | ORAL_CAPSULE | Freq: Every day | ORAL | Status: DC

## 2023-11-15 MED ORDER — FLUOXETINE HCL 20 MG PO CAPS
20.0000 mg | ORAL_CAPSULE | Freq: Every day | ORAL | Status: DC
Start: 2023-11-15 — End: 2023-11-15
  Administered 2023-11-15: 20 mg via ORAL
  Filled 2023-11-15: qty 1

## 2023-11-15 MED ORDER — PANTOPRAZOLE SODIUM 40 MG PO TBEC
40.0000 mg | DELAYED_RELEASE_TABLET | Freq: Every day | ORAL | Status: DC
  Administered 2023-11-15: 40 mg via ORAL
  Filled 2023-11-15: qty 1

## 2023-11-15 MED ORDER — GABAPENTIN 300 MG PO CAPS: 300.0000 mg | ORAL_CAPSULE | Freq: Every day | ORAL | Status: DC

## 2023-11-15 MED ORDER — CIPROFLOXACIN HCL 500 MG PO TABS
500.0000 mg | ORAL_TABLET | Freq: Two times a day (BID) | ORAL | 0 refills | Status: DC
  Filled 2023-11-15: qty 20, 10d supply, fill #0

## 2023-11-15 MED ORDER — FAMOTIDINE 20 MG PO TABS: 20.0000 mg | ORAL_TABLET | Freq: Two times a day (BID) | ORAL | Status: DC | PRN

## 2023-11-15 MED ORDER — CIPROFLOXACIN HCL 500 MG PO TABS: 500.0000 mg | ORAL_TABLET | Freq: Two times a day (BID) | ORAL | 0 refills | Status: DC

## 2023-11-15 MED ORDER — CIPROFLOXACIN HCL 500 MG PO TABS
500.0000 mg | ORAL_TABLET | Freq: Two times a day (BID) | ORAL | Status: DC
Start: 2023-11-15 — End: 2023-11-15
  Administered 2023-11-15: 500 mg via ORAL
  Filled 2023-11-15: qty 1

## 2023-11-15 MED ORDER — ALPRAZOLAM 0.5 MG PO TABS: 2.0000 mg | ORAL_TABLET | Freq: Every day | ORAL | Status: DC

## 2023-11-15 MED ORDER — TRAZODONE HCL 100 MG PO TABS
300.0000 mg | ORAL_TABLET | Freq: Once | ORAL | Status: AC
  Administered 2023-11-15: 300 mg via ORAL
  Filled 2023-11-15: qty 3

## 2023-11-15 MED ORDER — VENLAFAXINE HCL ER 75 MG PO CP24
75.0000 mg | ORAL_CAPSULE | Freq: Every day | ORAL | Status: DC
Start: 2023-11-16 — End: 2023-11-15

## 2023-11-15 MED ORDER — GABAPENTIN 100 MG PO CAPS
100.0000 mg | ORAL_CAPSULE | Freq: Every day | ORAL | Status: DC
  Administered 2023-11-15: 100 mg via ORAL
  Filled 2023-11-15: qty 1

## 2023-11-15 NOTE — Plan of Care (Signed)

## 2023-11-15 NOTE — Discharge Summary (Signed)
 Physician Discharge Summary   Patient: Mackenzie Bonilla MRN: [id number] DOB: Dec 14, 1965  Admit date:     11/14/2023  Discharge date: 11/15/23  Discharge Physician: Amaryllis Dare   PCP: Randeen Laine LABOR, MD   Recommendations at discharge:  Please follow-up on final urine culture results and make changes to antibiotics if needed. Follow-up with urology Follow-up with primary care provider Follow-up in a pulmonary nodule clinic  Discharge Diagnoses: Principal Problem:   Ureterolithiasis Active Problems:   Type 2 diabetes mellitus without complication, without long-term current use of insulin (HCC)   Ureteral colic   Acute cystitis without hematuria   Hospital Course: Partly taken from H&P.  Mackenzie Bonilla is a 58 y.o. female with medical history significant of  T2DM, HTN, prior kidney stones Presents to the emergency department for evaluation of sharp, intermittent, left sided flank pain that radiates around to her lower abdomen with associated fever, diaphoresis, rigors, and urinary frequency that started yesterday.  On presentation patient was febrile at 100.1, leukocytosis at 13.9, UA concerning for UTI, CT with mild left hydroureteronephrosis with a 3 mm calculus seen in the left UO UVJ.  Urology was consulted and patient was started on ceftriaxone .  Cultures were sent.  10/3: Vitals and labs stable, mild worsening of leukocytosis likely reactive after the cystoscopy and stent placement yesterday afternoon.  Left UVJ stone passed in bladder during the cystoscopy and was evacuated-analysis pending.  Preliminary blood cultures negative with pending urine cultures.  Patient was also noted to have right pulmonary nodule which has increased in size from the prior exam.  Ordered CT chest for further evaluation and patient need to follow-up in pulmonary nodule clinic.  Patient overall feeling much improved, no more pain.  Discussed with urology and patient is being discharged on  ciprofloxacin , a lot of medication intolerance was noted.  Patient has significant underlying anxiety and psych issues which can be contributory.  Discussed with patient and she was okay taking ciprofloxacin , [place] prescribed for 10 days.  Patient will follow-up closely with urology and they can follow-up on urine culture results-medication can be changed if needed.  CT chest was done before discharge  Patient will continue on current medications and need to have a close follow-up with her providers for further assistance.   Consultants: Urology Procedures performed: Cystoscopy and left ureteral stent placement Disposition: Home Diet recommendation:  Discharge Diet Orders (From admission, onward)     Start     Ordered   11/15/23 0000  Diet - low sodium heart healthy        11/15/23 1234           Carb modified diet DISCHARGE MEDICATION: Allergies as of 11/15/2023       Reactions   Cephalexin    Codeine    rash   Duloxetine    Levaquin  [levofloxacin ] Nausea Only   Lisinopril Cough   Metformin  And Related Diarrhea   GI side effects    Quetiapine Other (See Comments)   Sertraline Hcl    REACTION: vivid dreams   Triazolam    REACTION: ? reaction   Zaleplon    Zocor [simvastatin - High Dose]    achey   Zolpidem Tartrate    hallucinations   Zyrtec [cetirizine]    Sedation    Hydrocodone  Itching, Rash   REACTION: redness rash itching   Norelgestromin-eth Estradiol     Patch didn't stick to skin        Medication List  TAKE these medications    acetaminophen  500 MG tablet Commonly known as: TYLENOL  Take 500 mg by mouth every 6 (six) hours as needed.   albuterol  108 (90 Base) MCG/ACT inhaler Commonly known as: VENTOLIN  HFA INHALE 2 PUFFS INTO THE LUNGS EVERY 6 HOURS AS NEEDED FOR WHEEZING.   ALPRAZolam 1 MG tablet Commonly known as: XANAX Take 2 mg by mouth at bedtime.   ammonium lactate  12 % cream Commonly known as: AMLACTIN Apply 1 Application  topically as needed for dry skin.   BD Pen Needle Nano U/F 32G X 4 MM Misc Generic drug: Insulin Pen Needle daily.   ciprofloxacin  500 MG tablet Commonly known as: CIPRO  Take 1 tablet (500 mg total) by mouth 2 (two) times daily for 10 days.   cyclobenzaprine  5 MG tablet Commonly known as: FLEXERIL  TAKE 1 TABLET BY MOUTH 3 TIMES DAILY AS NEEDED FOR MUSCLE SPASMS   desvenlafaxine 50 MG 24 hr tablet Commonly known as: PRISTIQ Take 50 mg by mouth daily.   dimenhyDRINATE 50 MG tablet Commonly known as: DRAMAMINE Take 50 mg by mouth every 8 (eight) hours as needed.   docusate sodium  100 MG capsule Commonly known as: COLACE TAKE ONE CAPSULE BY MOUTH TWICE DAILY.   famotidine  20 MG tablet Commonly known as: PEPCID  Take 1 tablet (20 mg total) by mouth 2 (two) times daily as needed for heartburn or indigestion (for acid reflux).   Fifty50 Glucose Meter 2.0 w/Device Kit Use as directed   FLUoxetine 20 MG capsule Commonly known as: PROZAC Take 20 mg by mouth daily.   fluticasone  50 MCG/ACT nasal spray Commonly known as: FLONASE  Place into both nostrils daily.   gabapentin  100 MG capsule Commonly known as: NEURONTIN  Take 1 capsule (100 mg total) by mouth daily. With breakfast   gabapentin  300 MG capsule Commonly known as: NEURONTIN  Take 1 capsule (300 mg total) by mouth at bedtime.   GAS-X PO Take by mouth as needed.   hydrocortisone  2.5 % rectal cream Commonly known as: Anusol -HC Apply to affected (hemorrhoid area) once daily at bedtime as needed for no more than 10 days in a row   ketoconazole  2 % cream Commonly known as: NIZORAL  Apply 1 Application topically daily as needed for irritation.   Linzess  290 MCG Caps capsule Generic drug: linaclotide  TAKE 1 CAPSULE BY MOUTH ONCE DAILY BEFORE BREAKFAST What changed: Another medication with the same name was removed. Continue taking this medication, and follow the directions you see here.   losartan 25 MG  tablet Commonly known as: COZAAR   minoxidil  2.5 MG tablet Commonly known as: LONITEN  Take 1 tablet (2.5 mg total) by mouth daily.   mometasone  0.1 % cream Commonly known as: ELOCON  Apply to affected area left thigh twice daily as needed for flares/itch. Avoid applying to face, groin, and axilla. Use as directed. Long-term use can cause thinning of the skin.   Mounjaro 12.5 MG/0.5ML Pen Generic drug: tirzepatide Inject 12.5 mg into the skin once a week.   omeprazole  40 MG capsule Commonly known as: PRILOSEC Take 1 capsule (40 mg total) by mouth in the morning and at bedtime.   oxybutynin  15 MG 24 hr tablet Commonly known as: DITROPAN  XL Take 1 tablet (15 mg total) by mouth daily.   pravastatin  20 MG tablet Commonly known as: PRAVACHOL  Take 1 tablet (20 mg total) by mouth at bedtime.   spironolactone  100 MG tablet Commonly known as: ALDACTONE  TAKE 1 TABLET BY MOUTH DAILY   SYSTANE OP  Apply 1 drop to eye daily as needed.   traMADol  50 MG tablet Commonly known as: ULTRAM  Take 1 tablet (50 mg total) by mouth every 12 (twelve) hours as needed for severe pain (pain score 7-10). Caution of sedation   traZODone 150 MG tablet Commonly known as: DESYREL Take 300 mg by mouth at bedtime.   Tresiba FlexTouch 100 UNIT/ML FlexTouch Pen Generic drug: insulin degludec Inject 16 Units into the skin daily.   zinc oxide 11.3 % Crea cream Commonly known as: BALMEX Apply 1 application topically 2 (two) times daily.               Discharge Care Instructions  (From admission, onward)           Start     Ordered   11/15/23 0000  No dressing needed        11/15/23 1234            Follow-up Information     Tower, Laine LABOR, MD. Schedule an appointment as soon as possible for a visit in 1 week(s).   Specialties: Family Medicine, Radiology Contact information: [address] [place] [address] [phone]         Georganne Penne SAUNDERS, MD. Schedule an  appointment as soon as possible for a visit.   Specialty: Urology Contact information: [address] [place] [address] [phone]                Discharge Exam: Fredricka Weights   11/14/23 1031  Weight: 99.8 kg   General.  Obese lady, in no acute distress. Pulmonary.  Lungs clear bilaterally, normal respiratory effort. CV.  Regular rate and rhythm, no JVD, rub or murmur. Abdomen.  Soft, nontender, nondistended, BS positive. CNS.  Alert and oriented .  No focal neurologic deficit. Extremities.  No edema, no cyanosis, pulses intact and symmetrical. Psychiatry.  Judgment and insight appears normal.   Condition at discharge: stable  The results of significant diagnostics from this hospitalization (including imaging, microbiology, ancillary and laboratory) are listed below for reference.   Imaging Studies: DG OR UROLOGY CYSTO IMAGE ([facility] ONLY) Result Date: 11/14/2023 There is no interpretation for this exam.  This order is for images obtained during a surgical procedure.  Please See Surgeries Tab for more information regarding the procedure.   DG Chest Portable 1 View Result Date: 11/14/2023 CLINICAL DATA:  eval infiltrate, SIRS/Sepsis, left flank pain EXAM: PORTABLE CHEST - 1 VIEW COMPARISON:  12/21/2016 FINDINGS: Patchy right lung base and retrocardiac airspace opacities. No pleural effusion or pneumothorax. Mild cardiomegaly. No acute fracture or destructive lesions. Multilevel thoracic osteophytosis. IMPRESSION: Patchy airspace opacities in the lung bases, which may reflect atelectasis or a developing bronchopneumonia. Electronically Signed   By: Rogelia Myers M.D.   On: 11/14/2023 15:12   CT Renal Stone Study Result Date: 11/14/2023 CLINICAL DATA:  Left flank pain. Nausea and vomiting. Urolithiasis. EXAM: CT ABDOMEN AND PELVIS WITHOUT CONTRAST TECHNIQUE: Multidetector CT imaging of the abdomen and pelvis was performed following the standard protocol without IV contrast.  RADIATION DOSE REDUCTION: This exam was performed according to the departmental dose-optimization program which includes automated exposure control, adjustment of the mA and/or kV according to patient size and/or use of iterative reconstruction technique. COMPARISON:  06/23/2015 FINDINGS: Lower chest: 10 mm solid nodule is seen in the right lower lobe on image 16/4, which appears increased in size from 5-6 mm previously. Hepatobiliary: No mass visualized on this unenhanced exam. Gallbladder is  unremarkable. No evidence of biliary ductal dilatation. Pancreas: No mass or inflammatory process visualized on this unenhanced exam. Spleen:  Within normal limits in size. Adrenals/Urinary tract: A partial staghorn calculus is again seen in the left renal collecting system, measuring 30 x 12 mm. Mild left hydroureteronephrosis shows mild increase since previous study, with 3 mm calculus again seen at the left UVJ. Moderate diffuse left renal parenchymal atrophy has increased since previous prior study. Stomach/Bowel: No evidence of obstruction, inflammatory process, or abnormal fluid collections. Normal appendix visualized. Vascular/Lymphatic: No pathologically enlarged lymph nodes identified. No evidence of abdominal aortic aneurysm. Reproductive:  No mass or other significant abnormality. Other:  None. Musculoskeletal:  No suspicious bone lesions identified. IMPRESSION: Increased mild left hydroureteronephrosis, with 3 mm calculus again seen at the left UVJ. Increased moderate diffuse left renal parenchymal atrophy. Partial staghorn calculus again seen in left renal collecting system. 10 mm right lower lobe pulmonary nodule, increased in size since previous study. Recommend chest CT without contrast for further evaluation. Electronically Signed   By: Norleen DELENA Kil M.D.   On: 11/14/2023 13:00    Microbiology: Results for orders placed or performed during the hospital encounter of 11/14/23  Blood culture (routine x 2)      Status: None (Preliminary result)   Collection Time: 11/14/23  2:36 PM   Specimen: BLOOD  Result Value Ref Range Status   Specimen Description   Final    BLOOD BLOOD RIGHT ARM Performed at [facility], [address] [place], [place] [address]    Special Requests   Final    BOTTLES DRAWN AEROBIC AND ANAEROBIC Blood Culture results may not be optimal due to an inadequate volume of blood received in culture bottles Performed at [facility], [address] [place], [place] [address]    Culture   Final    NO GROWTH < 24 HOURS Performed at [facility], [address], [place], [place] [address]    Report Status PENDING  Incomplete  Blood culture (routine x 2)     Status: None (Preliminary result)   Collection Time: 11/14/23  2:36 PM   Specimen: BLOOD  Result Value Ref Range Status   Specimen Description   Final    BLOOD BLOOD LEFT ARM Performed at [facility], [address], [place], [place] [address]    Special Requests   Final    BOTTLES DRAWN AEROBIC AND ANAEROBIC Blood Culture results may not be optimal due to an inadequate volume of blood received in culture bottles Performed at [facility], [address] [place], [place] [address]    Culture   Final    NO GROWTH < 24 HOURS Performed at [facility], [address] [place], [place] [address]    Report Status PENDING  Incomplete   *Note: Due to a large number of results and/or encounters for the requested time period, some results have not been displayed. A complete set of results can be found in Results Review.    Labs: CBC: Recent Labs  Lab 11/14/23 1042 11/15/23 0453  WBC 13.9* 15.1*  HGB 15.2* 12.1  HCT 45.7 35.8*  MCV 95.4 97.8  PLT 208 151   Basic Metabolic Panel: Recent Labs  Lab 11/14/23 1042 11/15/23 0453  NA 139 141  K 3.7 4.9  CL 103 109  CO2 24 24  GLUCOSE 112* 119*  BUN 19 20  CREATININE 1.05* 1.07*  CALCIUM 9.5 8.5*   Liver  Function Tests: Recent Labs  Lab 11/14/23 1042  AST 22  ALT 16  ALKPHOS 73  BILITOT 0.8  PROT 7.4  ALBUMIN 4.2   CBG: Recent Labs  Lab 11/14/23 1654 11/14/23 1843 11/14/23 2141 11/15/23 0806 11/15/23 1141  GLUCAP 114* 113* 134* 101* 81    Discharge time spent: greater than 30 minutes.  This record has been created using Conservation officer, historic buildings. Errors have been sought and corrected,but may not always be located. Such creation errors do not reflect on the standard of care.   Signed: Amaryllis Dare, MD [facility] 11/15/2023

## 2023-11-15 NOTE — Progress Notes (Signed)
 Urology Inpatient Progress Note  Subjective: No acute events overnight. She is afebrile, BP remains stably soft. White count up, 15.1.  Creatinine stable, 1.07.  Blood cultures pending with no growth at <24 hours, urine culture pending.  On antibiotics as below. She is spontaneously voiding.  She has some mild dysuria.  She is concerned that she is not receiving her home medications and states that her costochondritis is acting up.  Anti-infectives: Anti-infectives (From admission, onward)    Start     Dose/Rate Route Frequency Ordered Stop   11/15/23 1200  cefTRIAXone  (ROCEPHIN ) 2 g in sodium chloride  0.9 % 100 mL IVPB        2 g 200 mL/hr over 30 Minutes Intravenous Every 24 hours 11/14/23 1510     11/14/23 1145  cefTRIAXone  (ROCEPHIN ) 2 g in sodium chloride  0.9 % 100 mL IVPB        2 g 200 mL/hr over 30 Minutes Intravenous  Once 11/14/23 1142 11/14/23 1259       Current Facility-Administered Medications  Medication Dose Route Frequency Provider Last Rate Last Admin   acetaminophen  (TYLENOL ) tablet 650 mg  650 mg Oral Q6H PRN Georganne Penne SAUNDERS, MD       Or   acetaminophen  (TYLENOL ) suppository 650 mg  650 mg Rectal Q6H PRN Georganne Penne SAUNDERS, MD       ALPRAZolam SHEFFIELD) tablet 2 mg  2 mg Oral QHS Amin, Sumayya, MD       cefTRIAXone  (ROCEPHIN ) 2 g in sodium chloride  0.9 % 100 mL IVPB  2 g Intravenous Q24H Garren, Brandon R, MD       enoxaparin (LOVENOX) injection 50 mg  50 mg Subcutaneous Q24H Garren, Brandon R, MD   50 mg at 11/14/23 2346   famotidine  (PEPCID ) tablet 20 mg  20 mg Oral BID PRN Amin, Sumayya, MD       FLUoxetine (PROZAC) capsule 20 mg  20 mg Oral Daily Amin, Sumayya, MD       gabapentin  (NEURONTIN ) capsule 100 mg  100 mg Oral Daily Amin, Sumayya, MD       gabapentin  (NEURONTIN ) capsule 300 mg  300 mg Oral QHS Amin, Sumayya, MD       insulin aspart (novoLOG) injection 0-15 Units  0-15 Units Subcutaneous TID WC Garren, Brandon R, MD       insulin aspart (novoLOG)  injection 0-5 Units  0-5 Units Subcutaneous QHS Garren, Brandon R, MD       ketorolac  (TORADOL ) 15 MG/ML injection 15 mg  15 mg Intravenous Q8H PRN Georganne Penne SAUNDERS, MD       lactated ringers  infusion   Intravenous Continuous Georganne Penne SAUNDERS, MD 100 mL/hr at 11/14/23 1643 New Bag at 11/14/23 1643   [START ON 11/16/2023] linaclotide  (LINZESS ) capsule 290 mcg  290 mcg Oral QAC breakfast Amin, Sumayya, MD       methocarbamol (ROBAXIN) injection 500 mg  500 mg Intravenous Q6H PRN Georganne Penne SAUNDERS, MD       ondansetron  (ZOFRAN ) tablet 4 mg  4 mg Oral Q6H PRN Georganne Penne SAUNDERS, MD       Or   ondansetron  (ZOFRAN ) injection 4 mg  4 mg Intravenous Q6H PRN Garren, Brandon R, MD       oxybutynin  (DITROPAN -XL) 24 hr tablet 15 mg  15 mg Oral Daily Amin, Sumayya, MD       pantoprazole  (PROTONIX ) EC tablet 40 mg  40 mg Oral Daily Amin, Sumayya, MD  senna-docusate (Senokot-S) tablet 1 tablet  1 tablet Oral QHS PRN Georganne Penne SAUNDERS, MD       traMADol  (ULTRAM ) tablet 50 mg  50 mg Oral Q6H PRN Georganne Penne SAUNDERS, MD       [START ON 11/16/2023] venlafaxine XR (EFFEXOR-XR) 24 hr capsule 75 mg  75 mg Oral Q breakfast Caleen Qualia, MD       Objective: Vital signs in last 24 hours: Temp:  [97.4 F (36.3 C)-100.1 F (37.8 C)] 98 F (36.7 C) (10/03 0758) Pulse Rate:  [57-123] 57 (10/03 0758) Resp:  [16-23] 16 (10/03 0758) BP: (83-138)/(40-99) 106/45 (10/03 0758) SpO2:  [87 %-99 %] 97 % (10/03 0758)  Intake/Output from previous day: 10/02 0701 - 10/03 0700 In: 1260 [P.O.:260; I.V.:950; IV Piggyback:50] Out: -  Intake/Output this shift: No intake/output data recorded.  Physical Exam Vitals and nursing note reviewed.  Constitutional:      General: She is not in acute distress.    Appearance: She is not ill-appearing, toxic-appearing or diaphoretic.  HENT:     Head: Normocephalic and atraumatic.  Pulmonary:     Effort: Pulmonary effort is normal. No respiratory distress.  Skin:    General: Skin  is warm and dry.  Neurological:     Mental Status: She is alert and oriented to person, place, and time.  Psychiatric:        Mood and Affect: Mood normal.        Behavior: Behavior normal.     Lab Results:  Recent Labs    11/14/23 1042 11/15/23 0453  WBC 13.9* 15.1*  HGB 15.2* 12.1  HCT 45.7 35.8*  PLT 208 151   BMET Recent Labs    11/14/23 1042 11/15/23 0453  NA 139 141  K 3.7 4.9  CL 103 109  CO2 24 24  GLUCOSE 112* 119*  BUN 19 20  CREATININE 1.05* 1.07*  CALCIUM 9.5 8.5*   Studies/Results: DG OR UROLOGY CYSTO IMAGE ([facility] ONLY) Result Date: 11/14/2023 There is no interpretation for this exam.  This order is for images obtained during a surgical procedure.  Please See Surgeries Tab for more information regarding the procedure.   DG Chest Portable 1 View Result Date: 11/14/2023 CLINICAL DATA:  eval infiltrate, SIRS/Sepsis, left flank pain EXAM: PORTABLE CHEST - 1 VIEW COMPARISON:  12/21/2016 FINDINGS: Patchy right lung base and retrocardiac airspace opacities. No pleural effusion or pneumothorax. Mild cardiomegaly. No acute fracture or destructive lesions. Multilevel thoracic osteophytosis. IMPRESSION: Patchy airspace opacities in the lung bases, which may reflect atelectasis or a developing bronchopneumonia. Electronically Signed   By: Rogelia Myers M.D.   On: 11/14/2023 15:12   CT Renal Stone Study Result Date: 11/14/2023 CLINICAL DATA:  Left flank pain. Nausea and vomiting. Urolithiasis. EXAM: CT ABDOMEN AND PELVIS WITHOUT CONTRAST TECHNIQUE: Multidetector CT imaging of the abdomen and pelvis was performed following the standard protocol without IV contrast. RADIATION DOSE REDUCTION: This exam was performed according to the departmental dose-optimization program which includes automated exposure control, adjustment of the mA and/or kV according to patient size and/or use of iterative reconstruction technique. COMPARISON:  06/23/2015 FINDINGS: Lower chest: 10 mm solid  nodule is seen in the right lower lobe on image 16/4, which appears increased in size from 5-6 mm previously. Hepatobiliary: No mass visualized on this unenhanced exam. Gallbladder is unremarkable. No evidence of biliary ductal dilatation. Pancreas: No mass or inflammatory process visualized on this unenhanced exam. Spleen:  Within normal limits in size. Adrenals/Urinary tract:  A partial staghorn calculus is again seen in the left renal collecting system, measuring 30 x 12 mm. Mild left hydroureteronephrosis shows mild increase since previous study, with 3 mm calculus again seen at the left UVJ. Moderate diffuse left renal parenchymal atrophy has increased since previous prior study. Stomach/Bowel: No evidence of obstruction, inflammatory process, or abnormal fluid collections. Normal appendix visualized. Vascular/Lymphatic: No pathologically enlarged lymph nodes identified. No evidence of abdominal aortic aneurysm. Reproductive:  No mass or other significant abnormality. Other:  None. Musculoskeletal:  No suspicious bone lesions identified. IMPRESSION: Increased mild left hydroureteronephrosis, with 3 mm calculus again seen at the left UVJ. Increased moderate diffuse left renal parenchymal atrophy. Partial staghorn calculus again seen in left renal collecting system. 10 mm right lower lobe pulmonary nodule, increased in size since previous study. Recommend chest CT without contrast for further evaluation. Electronically Signed   By: Norleen DELENA Kil M.D.   On: 11/14/2023 13:00   Assessment & Plan: 58 y.o. female with PMH recurrent UTIs and diabetes now POD 1 from left ureteral stent placement with Dr. Georganne for management of a left partial staghorn stone in the setting of UTI with possible early sepsis.  Her left UVJ stone spontaneously passed into the bladder intraoperatively.  Only her left partial staghorn stone remains.  We discussed that she will require 10 to 14 days of culture appropriate antibiotics and  outpatient ureteroscopy with laser lithotripsy and stent exchange with Dr. Georganne in 2 to 3 weeks for definitive management of her stone.  She appears to be tolerating her stent rather well for now.  If she develops discomfort, okay to start Flomax  0.4 mg daily and oxybutynin  5 mg every 8 hours as needed.  She asked me to update her sister via telephone, however her sister does not appear in her DPR so I am deferring this.  She prefers to see us  in clinic prior to follow-up surgery to reiterate the plan, as she is concerned she will forget our conversation today.  I will get her set up for this.  Recommendations: - Continue antibiotics and follow cultures for total of 10 to 14 days of culture appropriate therapy - Consider Flomax  0.4 mg daily and oxybutynin  5 mg every 8 hours as needed for stent discomfort - Outpatient left ureteroscopy with laser lithotripsy and stent exchange in 2 to 3 weeks, will arrange outpatient office visit to reiterate the plan prior  Lucie Hones, PA-C 11/15/2023

## 2023-11-15 NOTE — Hospital Course (Addendum)
 Partly taken from H&P.  Mackenzie Bonilla is a 58 y.o. female with medical history significant of  T2DM, HTN, prior kidney stones Presents to the emergency department for evaluation of sharp, intermittent, left sided flank pain that radiates around to her lower abdomen with associated fever, diaphoresis, rigors, and urinary frequency that started yesterday.  On presentation patient was febrile at 100.1, leukocytosis at 13.9, UA concerning for UTI, CT with mild left hydroureteronephrosis with a 3 mm calculus seen in the left UO UVJ.  Urology was consulted and patient was started on ceftriaxone .  Cultures were sent.  10/3: Vitals and labs stable, mild worsening of leukocytosis likely reactive after the cystoscopy and stent placement yesterday afternoon.  Left UVJ stone passed in bladder during the cystoscopy and was evacuated-analysis pending.  Preliminary blood cultures negative with pending urine cultures.  Patient was also noted to have right pulmonary nodule which has increased in size from the prior exam.  Ordered CT chest for further evaluation and patient need to follow-up in pulmonary nodule clinic.  Patient overall feeling much improved, no more pain.  Discussed with urology and patient is being discharged on ciprofloxacin , a lot of medication intolerance was noted.  Patient has significant underlying anxiety and psych issues which can be contributory.  Discussed with patient and she was okay taking ciprofloxacin , [place] prescribed for 10 days.  Patient will follow-up closely with urology and they can follow-up on urine culture results-medication can be changed if needed.  CT chest was done before discharge  Patient will continue on current medications and need to have a close follow-up with her providers for further assistance.

## 2023-11-15 NOTE — Telephone Encounter (Signed)
 Unable to reach patient. Left voicemail to return call to our office.

## 2023-11-15 NOTE — Care Management CC44 (Signed)
 Condition Code 44 Documentation Completed  Patient Details  Name: KHRYSTYNE ARPIN MRN: [id number] Date of Birth: [birth year]   Condition Code 44 given:  Yes Patient signature on Condition Code 44 notice:  Yes Documentation of 2 MD's agreement:  Yes Code 44 added to claim:  Yes    Corean ONEIDA Haddock, RN 11/15/2023, 1:06 PM

## 2023-11-15 NOTE — Telephone Encounter (Signed)
 FYI Only or Action Required?: FYI only for provider.  Patient was last seen in primary care on 10/17/2023 by Randeen Laine LABOR, MD.  Called Nurse Triage reporting Medication Reaction.  Symptoms began today.  Interventions attempted: Nothing.  Symptoms are: gradually worsening.  Triage Disposition: Call PCP When Office is Open  Patient/caregiver understands and will follow disposition?: Yes  **See note below**         Copied from CRM #[id number]. Topic: Clinical - Red Word Triage >> Nov 15, 2023  4:39 PM Alfonso ORN wrote: Red Word that prompted transfer to Nurse Triage: possible reaction to new medication burning/warm sensations on face and body. Pt has questions on ciprofloxacin  (CIPRO ) 500 MG tablet Reason for Disposition  Caller wants to use a complementary or alternative medicine  Answer Assessment - Initial Assessment Questions 1. NAME of MEDICINE: What medicine(s) are you calling about?     Cipro  500mg    2. QUESTION: What is your question? (e.g., double dose of medicine, side effect)     She flash flushing feeling hot from Cipro  500mg   3. PRESCRIBER: Who prescribed the medicine? Reason: if prescribed by specialist, call should be referred to that group.     ED on 10/2 yesterday morning for a UTI, and kindney stones, she has 2 stents placed.   4. SYMPTOMS: Do you have any symptoms? If Yes, ask: What symptoms are you having?  How bad are the symptoms (e.g., mild, moderate, severe)  Feeling hot, and flush no fever noted.  Patient was advised to follow-up/ proceed to the ED that prescribed the medication; or be seen in UC for symptoms related to the Cipro  from her hospital visit on 10/2, as there are no more avail. Appts this late in the evening. She agrees with plan of care, and will follow-up on Monday 10/6.  Protocols used: Medication Question Call-A-AH

## 2023-11-15 NOTE — Care Management Obs Status (Signed)
 MEDICARE OBSERVATION STATUS NOTIFICATION   Patient Details  Name: Mackenzie Bonilla MRN: [id number] Date of Birth: 1966-02-07   Medicare Observation Status Notification Given:  Yes    Corean ONEIDA Haddock, RN 11/15/2023, 1:06 PM

## 2023-11-16 ENCOUNTER — Ambulatory Visit: Payer: Self-pay | Admitting: Internal Medicine

## 2023-11-16 ENCOUNTER — Other Ambulatory Visit: Payer: Self-pay

## 2023-11-16 DIAGNOSIS — E119 Type 2 diabetes mellitus without complications: Secondary | ICD-10-CM | POA: Diagnosis not present

## 2023-11-16 LAB — URINE CULTURE: Culture: >=100000 — AB

## 2023-11-16 MED ORDER — CEPHALEXIN 500 MG PO CAPS
500.0000 mg | ORAL_CAPSULE | Freq: Three times a day (TID) | ORAL | 0 refills | Status: DC
  Filled 2023-11-16: qty 30, 10d supply, fill #0

## 2023-11-17 NOTE — Telephone Encounter (Signed)
 Her antibiotics were changed to keflex per note

## 2023-11-18 ENCOUNTER — Other Ambulatory Visit: Payer: Self-pay

## 2023-11-18 ENCOUNTER — Telehealth: Payer: Self-pay

## 2023-11-18 ENCOUNTER — Telehealth: Payer: Self-pay | Admitting: *Deleted

## 2023-11-18 ENCOUNTER — Ambulatory Visit: Payer: Self-pay

## 2023-11-18 DIAGNOSIS — N200 Calculus of kidney: Secondary | ICD-10-CM | POA: Insufficient documentation

## 2023-11-18 NOTE — Telephone Encounter (Signed)
 Per Dr. Georganne, Patient is to be scheduled for Left Ureteroscopy with Laser Lithotripsy and Stent Exchange   Ms. Tech was contacted and possible surgical dates were discussed, Tuesday October 21st, 2025 was agreed upon for surgery.   Patient was directed to call [phone] between 1-3pm the day before surgery to find out surgical arrival time.  Instructions were given not to eat or drink from midnight on the night before surgery and have a driver for the day of surgery. On the surgery day patient was instructed to enter through the [facility] entrance of [facility] report the Same Day Surgery desk.   Pre-Admit Testing will be in contact via phone to set up an interview with the anesthesia team to review your history and medications prior to surgery.   Reminder of this information was sent via MyChart to the patient.

## 2023-11-18 NOTE — Progress Notes (Signed)
   [facility] Surgical Posting Form  Surgery Date: Date: 12/03/2023  Surgeon: Dr. Penne Skye, MD  Inpt ( No  )   Outpt (Yes)   Obs ( No  )   Diagnosis: N20.0 Left Nephrolithiasis  -CPT: 52356  Surgery: Left Ureteroscopy with Laser Lithotripsy and Stent Exchange  Stop Anticoagulations: No  PATIENT IS AWARE TO HOLD MOUNJARO PRIOR TO SURGERY- TAKES ON SUNDAYS.   Cardiac/Medical/Pulmonary Clearance needed: no  *Orders entered into EPIC  Date: 11/18/23   *Case booked in EPIC  Date: 11/18/23  *Notified pt of Surgery: Date: 11/18/23  PRE-OP UA & CX: NO  *Placed into Prior Authorization Work Que Date: 11/18/23  Assistant/laser/rep:No

## 2023-11-18 NOTE — Telephone Encounter (Signed)
 Discuss with pt and pt has an appt on Friday to discuss meds and concerns

## 2023-11-18 NOTE — H&P (View-Only) (Signed)
   11/21/2023 2:03 PM   Mackenzie Bonilla 1965-08-06 [id number]  Reason for visit: Follow up Left nephrolithiasis   HPI: 58 y.o. female, follow up with me today Completing abx therapy for E.coli (Resistant to Amp, cipro , Intermediate to Augmentin )  Overall recovering well since hospital and stent placement  Prior HPI: Mackenzie Bonilla is a 58 y.o. year old presented to ED 11/14/2023 Acute left flank pain CT stone (11/14/23) - 3 mm LEFT UVJ stone, moderate hydroureteronephrosis, partial Left staghorn at [facility] S/p Left ureteral stent placement 11/14/23   Previously followed by Dr. Penne in 2023 History of recurrent UTIs History of morbid obesity (BMI 38), claustrophobia, fibromyalgia, recurrent UTI, PCOS          Physical Exam: BP 97/65   Pulse 87   Ht 5' 4 (1.626 m)   Wt 220 lb (99.8 kg)   BMI 37.76 kg/m    Constitutional:  Alert and oriented, No acute distress.  Laboratory Data: Urine culture 11/14/23 - 100k E.coli (Resistant to Amp, cipro , Intermediate to Augmentin )   Pertinent Imaging: I have personally viewed and interpreted the CT Stone 11/14/23.    Assessment & Plan:    Nephrolithiasis Assessment & Plan: Large Left partial staghorn  S/p Left ureteral stent placement 11/14/23   Her left distal 3 mm stone passed during stent placement  I reviewed her prior clinical history and current stone burden.  She has significant left renal stone burden with partial staghorn formation.  This degree of stone volume would typically warrant PCNL for higher clearance rate-although I worry about her comorbidities and body habitus for a successful operation.  Alternatively we could pursue retrograde ureteroscopy with laser lithotripsy, although this may require staging with multiple procedures for full clearance.  I explained these procedures in detail today with a focus on URS/LL.  She was amenable to proceeding with left URS/LL, possible staged procedures.   - tentative  surgery date of Oct 21stL Left URS/LL, stent exchange   - will look for an early Nov date as placeholder in case 2nd procedure required - UA 7 days prior to next surgery        Penne JONELLE Skye, MD  [facility] [address] [place], [place] [address] [phone]

## 2023-11-18 NOTE — Assessment & Plan Note (Signed)
 Large Left partial staghorn  S/p Left ureteral stent placement 11/14/23   Her left distal 3 mm stone passed during stent placement  I reviewed her prior clinical history and current stone burden.  She has significant left renal stone burden with partial staghorn formation.  This degree of stone volume would typically warrant PCNL for higher clearance rate-although I worry about her comorbidities and body habitus for a successful operation.  Alternatively we could pursue retrograde ureteroscopy with laser lithotripsy, although this may require staging with multiple procedures for full clearance.  I explained these procedures in detail today with a focus on URS/LL.

## 2023-11-18 NOTE — Progress Notes (Signed)
 Surgical Physician Order Form [facility] [place]  Dr. Georganne * Scheduling expectation : 2-3 weeks  *Length of Case:   *Clearance needed: no  *Anticoagulation Instructions: N/A  *Aspirin Instructions: N/A  *Post-op visit Date/Instructions:  1 week cysto stent removal  *Diagnosis: Left Nephrolithiasis  *Procedure: left  Ureteroscopy w/laser lithotripsy & stent exchange (47643)   Additional orders: N/A  -Admit type: OUTpatient  -Anesthesia: General  -VTE Prophylaxis Standing Order SCD's       Other:   -Standing Lab Orders Per Anesthesia    Lab other: None  -Standing Test orders EKG/Chest x-ray per Anesthesia       Test other:   - Medications:  Ancef 2gm IV  -Other orders:  N/A

## 2023-11-18 NOTE — Telephone Encounter (Signed)
 Copied from CRM #[id number]. Topic: Clinical - Prescription Issue >> Nov 18, 2023 10:05 AM Zebedee SAUNDERS wrote: Reason for CRM:Pt was changed by doctor's in hospital to take linaclotide  (LINZESS ) 290 MCG CAPS capsule daily. Pt and Dr. Randeen had pt taking linaclotide  (LINZESS ) 290 MCG CAPS capsule and every other day 145 MCG. Does Dr. Randeen want her to go back to taking medication as before alternating. Please call pt at [phone]

## 2023-11-18 NOTE — Telephone Encounter (Signed)
  We had her alternate because I think the 290 was too much to take daily (she did not tolerate) It that is the case-continue to alternate  This is up to her based on her symptoms

## 2023-11-18 NOTE — Progress Notes (Unsigned)
   11/21/2023 8:27 PM   Mackenzie Bonilla [birth year] [id number]  Reason for visit: Follow up Left nephrolithiasis   HPI: 58 y.o. female, follow up with me today  Prior HPI: Mackenzie Bonilla is a 58 y.o. year old presented to ED 11/14/2023 Acute left flank pain CT stone (11/14/23) - 3 mm LEFT UVJ stone, moderate hydroureteronephrosis, partial Left staghorn at [facility] S/p Left ureteral stent placement 11/14/23   Previously followed by Dr. Penne in 2023 History of recurrent UTIs History of morbid obesity (BMI 38), claustrophobia, fibromyalgia, recurrent UTI, PCOS          Physical Exam: There were no vitals taken for this visit.   Constitutional:  Alert and oriented, No acute distress.  Laboratory Data: Urine culture 11/14/23 - 100k E.coli (Resistant to Amp, cipro , Intermediate to Augmentin )   Pertinent Imaging: I have personally viewed and interpreted the CT Stone 11/14/23.    Assessment & Plan:    Nephrolithiasis Assessment & Plan: Large Left partial staghorn  S/p Left ureteral stent placement 11/14/23   Her left distal 3 mm stone passed during stent placement  I reviewed her prior clinical history and current stone burden.  She has significant left renal stone burden with partial staghorn formation.  This degree of stone volume would typically warrant PCNL for higher clearance rate-although I worry about her comorbidities and body habitus for a successful operation.  Alternatively we could pursue retrograde ureteroscopy with laser lithotripsy, although this may require staging with multiple procedures for full clearance.  I explained these procedures in detail today with a focus on URS/LL.         Penne JONELLE Skye, MD  [facility] [address], [address] [place], [place] [address] [phone]

## 2023-11-18 NOTE — Telephone Encounter (Signed)
 Pt notified of Dr. Graham comments and will discuss everything at OV appt

## 2023-11-18 NOTE — Telephone Encounter (Signed)
 Will see patient then I reviewed her hospital notes  Agree with ER and UC precautions

## 2023-11-18 NOTE — Telephone Encounter (Signed)
 Patient refused OV for cough, was calling to request provider review ED notes. Has hospital f/u scheduled already.   Would like advise re: linzess  change by ED and asking if she should reschedule hospital f/u to after her urology appt and as an in person (currently scheduled as virtual same day as urology appt)?  FYI Only or Action Required?: Action required by provider: update on patient condition.  Patient was last seen in primary care on 10/17/2023 by Randeen Laine LABOR, MD.  Called Nurse Triage reporting Cough.  Symptoms began several months ago.  Interventions attempted: OTC medications: flonase , cough drops.  Symptoms are: unchanged.  Triage Disposition: See PCP Within 2 Weeks  Patient/caregiver understands and will follow disposition?: Yes  Reason for Disposition  Cough lasts > 3 weeks  Answer Assessment - Initial Assessment Questions Patient reports chronic cough x months that believes is r/t allergies. Has been using flonase .  Was ED on 11/14/23-11/16/23 for kidney stone. Had stent placed and every time she coughs, it is causing her to urinate. One stone passed, another is scheduled to be removed on 12/03/23.  Scheduled acute for cough as states needs to address for upcoming surgery, has f/u with urology already.   1. ONSET: When did the cough begin?      Several months  2. SPUTUM: Describe the color of your sputum (e.g., none, dry cough; clear, white, yellow, green)     Was several days ago, white mucus  4. HEMOPTYSIS: Are you coughing up any blood? If so ask: How much? (e.g., flecks, streaks, tablespoons, etc.)     Denies  5. DIFFICULTY BREATHING: Are you having difficulty breathing? If Yes, ask: How bad is it? (e.g., mild, moderate, severe)      Denies  6. FEVER: Do you have a fever? If Yes, ask: What is your temperature, how was it measured, and when did it start?     Unknown, was elevated when hospitalized. Does not believe she has had fever since  discharge.  Protocols used: Cough - Chronic-A-AH

## 2023-11-19 LAB — CULTURE, BLOOD (ROUTINE X 2)
Culture: NO GROWTH
Culture: NO GROWTH

## 2023-11-20 ENCOUNTER — Telehealth: Payer: Self-pay

## 2023-11-20 NOTE — Telephone Encounter (Signed)
 Pt called in with c/o back pn and urgency. No burning with urination, pain or fevers. Pt had stent placed recently and I let her know that this sounds more like stent discomfort and OAB from the stent as well. Pt understood and will give us  a call back if anything changes

## 2023-11-21 ENCOUNTER — Ambulatory Visit: Admitting: Urology

## 2023-11-21 ENCOUNTER — Inpatient Hospital Stay: Admitting: Family Medicine

## 2023-11-21 VITALS — BP 97/65 | HR 87 | Ht 64.0 in | Wt 220.0 lb

## 2023-11-21 DIAGNOSIS — N200 Calculus of kidney: Secondary | ICD-10-CM

## 2023-11-22 ENCOUNTER — Ambulatory Visit: Admitting: Family Medicine

## 2023-11-22 ENCOUNTER — Encounter: Payer: Self-pay | Admitting: Family Medicine

## 2023-11-22 VITALS — BP 118/72 | HR 89 | Temp 97.5°F | Ht 64.0 in | Wt 223.4 lb

## 2023-11-22 DIAGNOSIS — K5909 Other constipation: Secondary | ICD-10-CM

## 2023-11-22 DIAGNOSIS — R911 Solitary pulmonary nodule: Secondary | ICD-10-CM

## 2023-11-22 DIAGNOSIS — J0100 Acute maxillary sinusitis, unspecified: Secondary | ICD-10-CM

## 2023-11-22 DIAGNOSIS — E785 Hyperlipidemia, unspecified: Secondary | ICD-10-CM | POA: Diagnosis not present

## 2023-11-22 DIAGNOSIS — R051 Acute cough: Secondary | ICD-10-CM | POA: Diagnosis not present

## 2023-11-22 DIAGNOSIS — E1169 Type 2 diabetes mellitus with other specified complication: Secondary | ICD-10-CM | POA: Diagnosis not present

## 2023-11-22 DIAGNOSIS — N3000 Acute cystitis without hematuria: Secondary | ICD-10-CM | POA: Diagnosis not present

## 2023-11-22 DIAGNOSIS — J329 Chronic sinusitis, unspecified: Secondary | ICD-10-CM | POA: Insufficient documentation

## 2023-11-22 LAB — CBC WITH DIFFERENTIAL/PLATELET
Basophils Absolute: 0 K/uL (ref 0.0–0.1)
Basophils Relative: 0.4 % (ref 0.0–3.0)
Eosinophils Absolute: 0.2 K/uL (ref 0.0–0.7)
Eosinophils Relative: 3.2 % (ref 0.0–5.0)
HCT: 45.6 % (ref 36.0–46.0)
Hemoglobin: 15.1 g/dL — ABNORMAL HIGH (ref 12.0–15.0)
Lymphocytes Relative: 16.1 % (ref 12.0–46.0)
Lymphs Abs: 1 K/uL (ref 0.7–4.0)
MCHC: 33 g/dL (ref 30.0–36.0)
MCV: 97 fl (ref 78.0–100.0)
Monocytes Absolute: 0.6 K/uL (ref 0.1–1.0)
Monocytes Relative: 9.5 % (ref 3.0–12.0)
Neutro Abs: 4.6 K/uL (ref 1.4–7.7)
Neutrophils Relative %: 70.8 % (ref 43.0–77.0)
Platelets: 229 K/uL (ref 150.0–400.0)
RBC: 4.7 Mil/uL (ref 3.87–5.11)
RDW: 13.4 % (ref 11.5–15.5)
WBC: 6.5 K/uL (ref 4.0–10.5)

## 2023-11-22 LAB — COMPREHENSIVE METABOLIC PANEL WITH GFR
ALT: 21 U/L (ref 0–35)
AST: 17 U/L (ref 0–37)
Albumin: 4.4 g/dL (ref 3.5–5.2)
Alkaline Phosphatase: 69 U/L (ref 39–117)
BUN: 12 mg/dL (ref 6–23)
CO2: 28 meq/L (ref 19–32)
Calcium: 9 mg/dL (ref 8.4–10.5)
Chloride: 108 meq/L (ref 96–112)
Creatinine, Ser: 0.89 mg/dL (ref 0.40–1.20)
GFR: 71.5 mL/min (ref >60.00)
Glucose, Bld: 107 mg/dL — ABNORMAL HIGH (ref 70–99)
Potassium: 4.6 meq/L (ref 3.5–5.1)
Sodium: 142 meq/L (ref 135–145)
Total Bilirubin: 0.4 mg/dL (ref 0.2–1.2)
Total Protein: 6.5 g/dL (ref 6.0–8.3)

## 2023-11-22 LAB — LIPID PANEL
Cholesterol: 164 mg/dL (ref 0–200)
HDL: 34.2 mg/dL — ABNORMAL LOW (ref >39.00)
LDL Cholesterol: 98 mg/dL (ref 0–99)
NonHDL: 130.1
Total CHOL/HDL Ratio: 5
Triglycerides: 163 mg/dL — ABNORMAL HIGH (ref 0.0–149.0)
VLDL: 32.6 mg/dL (ref 0.0–40.0)

## 2023-11-22 LAB — POC COVID19 BINAXNOW: SARS Coronavirus 2 Ag: NEGATIVE

## 2023-11-22 MED ORDER — LINACLOTIDE 290 MCG PO CAPS: 290.0000 ug | ORAL_CAPSULE | ORAL | 3 refills | Status: DC

## 2023-11-22 MED ORDER — LINACLOTIDE 145 MCG PO CAPS: 145.0000 ug | ORAL_CAPSULE | ORAL | 3 refills | Status: DC

## 2023-11-22 NOTE — Assessment & Plan Note (Signed)
 Incidental finding in hospital -then CT chest done  Several nodule noted on CT resembling pulmonary hamartomas  No symptoms beyond mild chronic cough   Plan 6-12 month re check to document stabilithy

## 2023-11-22 NOTE — Assessment & Plan Note (Signed)
 Lab today  Continues pravastatin    Disc goals for lipids and reasons to control them Rev last labs with pt Rev low sat fat diet in detail

## 2023-11-22 NOTE — Assessment & Plan Note (Signed)
 More congestion (yellow mucous) with sinus pressure/pain since her hosp for renal stone Reassuring exam Neg covid test   Already on keflex-would not add antibiotic Encouraged to continue flonase  Try saline/netti pot as well   Update if not starting to improve in a week or if worsening  Call back and Er precautions noted in detail today

## 2023-11-22 NOTE — Assessment & Plan Note (Signed)
 E coli- occurred with renal stone  Reviewed hospital records, lab results and studies in detail   Continues keflex 500 mg tid (causing some diarrhea-will watch this)  Encouraged more water intake  Follow up with urology as planned   Cbc today in light of elevated wbc in hospital

## 2023-11-22 NOTE — Progress Notes (Signed)
 Subjective:    Patient ID: Mackenzie Bonilla, female    DOB: Jan 27, 1966, 58 y.o.   MRN: [id number]  HPI  Wt Readings from Last 3 Encounters:  11/22/23 223 lb 6 oz (101.3 kg)  11/21/23 220 lb (99.8 kg)  11/14/23 220 lb (99.8 kg)   38.34 kg/m  Vitals:   11/22/23 1228  BP: 118/72  Pulse: 89  Temp: (!) 97.5 F (36.4 C)  SpO2: 98%    Pt presents for c/o Recent hospitalization for kidney stone / stent placed  Cough/ear and sinus symptoms  Pulmonary nodule  Interested in lipid labs  Chronic constipation    Hosp 10/2-10/3  Presented with flank pain and fever  CT noted hydronephrosis and 3 mm calculus in left ureter  Treated with ceftriaxone   Had cystoscopy and stent placement -stone passed Seeing urology  Culture grew e choli- was treatment with keflex   Left URS/LL stent exchange planned for staghorn calculus later the month   Still has some pain  Taking ibuprofen  and tylenol     Incidental finding right pulmonary nodule -increased in size from previous exam  CT chest without contrast recommended   IMPRESSION: Increased mild left hydroureteronephrosis, with 3 mm calculus again seen at the left UVJ. Increased moderate diffuse left renal parenchymal atrophy. Partial staghorn calculus again seen in left renal collecting system. 10 mm right lower lobe pulmonary nodule, increased in size since previous study. Recommend chest CT without contrast for further evaluation.   Chronic constipation  Usually has bm once per week (currently diarrhea with the keflex)  Linzess  - does best to alternate   Respiratory symptoms  Annoying cough  Worse if air blows in her face or in dry conditions  Worse if dusty conditions   Sinus pain -both sides  Left side sometimes feels worse  Worse headaches since starting this antibiotic  Ears are full / sound is echo ing  Trying to drink fluids (? If always enough)   No fever since the hospital  Nasal d/c is yellow Some mucous in  throat    Over the counter Flonase      Lab Results  Component Value Date   WBC 15.1 (H) 11/15/2023   HGB 12.1 11/15/2023   HCT 35.8 (L) 11/15/2023   MCV 97.8 11/15/2023   PLT 151 11/15/2023   Lab Results  Component Value Date   NA 141 11/15/2023   K 4.9 11/15/2023   CO2 24 11/15/2023   GLUCOSE 119 (H) 11/15/2023   BUN 20 11/15/2023   CREATININE 1.07 (H) 11/15/2023   CALCIUM 8.5 (L) 11/15/2023   GFR 55.98 (L) 07/18/2022   EGFR 66 06/21/2023   GFRNONAA >60 11/15/2023      Patient Active Problem List   Diagnosis Date Noted   Sinusitis 11/22/2023   Pulmonary nodule 11/22/2023   Nephrolithiasis 11/18/2023   Ureteral colic 11/15/2023   Acute cystitis without hematuria 11/15/2023   Ureterolithiasis 11/14/2023   Costochondritis 10/17/2023   Meralgia paraesthetica, left 07/17/2023   Steroid side effects 12/28/2022   Dysphagia 11/01/2022   Gastric polyps 11/01/2022   Nontraumatic tear of skin 09/05/2022   Adverse effect of proton pump inhibitor 07/18/2022   Colon cancer screening 07/18/2022   Nasal congestion 04/30/2022   Breast asymmetry 02/21/2022   Muscle twitch 02/21/2022   Abdominal pannus 02/21/2022   Recurrent UTI 12/21/2021   Paresthesia 11/15/2021   Ingrown nail of great toe of left foot 11/15/2021   Keratosis 08/31/2021   Hair loss 06/19/2021  Breast pain 12/20/2020   Encounter for screening mammogram for breast cancer 12/13/2020   Lumbar facet arthropathy 12/29/2019   Chronic bilateral thoracic back pain 12/08/2019   Lumbar degenerative disc disease 12/08/2019   Fibromyalgia 12/08/2019   Chronic pain syndrome 12/08/2019   Lumbar radicular pain (left S1) 12/08/2019   Mid back pain 02/11/2019   Multiple joint pain 11/12/2018   Gallstones 04/19/2017   Diabetic polyneuropathy associated with type 2 diabetes mellitus (HCC) 04/07/2017   Diabetic angiopathy (HCC) 04/07/2017   Vasomotor symptoms due to menopause 04/07/2017   Mobility impaired  11/12/2016   Urinary incontinence 12/02/2015   Candidal intertrigo 04/06/2015   Cystocele 04/06/2015   Chronic constipation 04/06/2015   Myofascial pain syndrome of lumbar spine 12/28/2014   Hypertriglyceridemia 10/14/2014   Abdominal pain 04/23/2012   Uric acid kidney stone 11/20/2011   HYPERHIDROSIS 11/07/2009   Menopausal symptoms 06/17/2009   Type 2 diabetes mellitus without complication, without long-term current use of insulin (HCC) 08/23/2008   Vitamin D  deficiency 05/07/2008   Vitamin B 12 deficiency 06/04/2007   CHRONIC FATIGUE SYNDROME 11/15/2006   DIVERTICULITIS, HX OF 11/15/2006   PCOS (polycystic ovarian syndrome) 10/16/2006   Hyperlipidemia associated with type 2 diabetes mellitus (HCC) 10/16/2006   Morbid obesity (HCC) 10/16/2006   Generalized anxiety disorder 10/16/2006   PANIC DISORDER 10/16/2006   BULIMIA 10/16/2006   Chronic depression 10/16/2006   CARPAL TUNNEL SYNDROME 10/16/2006   Lymphedema 10/16/2006   Allergic rhinitis 10/16/2006   Mild intermittent asthma 10/16/2006   Temporomandibular joint disorder 10/16/2006   GERD 10/16/2006   Irritable bowel syndrome 10/16/2006   Osteoarthritis 10/16/2006   COUGH, CHRONIC 10/16/2006   Past Medical History:  Diagnosis Date   Allergy    allergic rhinitis   Anemia    Anxiety    Arthritis    osteoarthritis   Asthma    Claustrophobia    does not like oxygen mask covering face   Depression    Diabetes mellitus without complication (HCC)    Fatty liver 07/2008   abd. ultrasound - fatty liver ; slt dilated cbd (no stones ) 06/10// abd.  ultrasound normal on 04/2006   Fibromyalgia    GERD (gastroesophageal reflux disease)    EGD negative 06/2001// EGD erythematous mucosa, polyp 08/2008   High uric acid in 24 hour urine specimen    Hyperhidrosis    Hyperlipidemia    Kidney stones    Lymphedema    Obesity    PCOS (polycystic ovarian syndrome)    Sleep apnea    Tachycardia    TMJ (dislocation of  temporomandibular joint)    Past Surgical History:  Procedure Laterality Date   BIOPSY  11/01/2022   Procedure: BIOPSY;  Surgeon: Jinny Carmine, MD;  Location: [facility];  Service: Endoscopy;;   BREAST BIOPSY Right 2016   neg   BREAST LUMPECTOMY WITH RADIOACTIVE SEED LOCALIZATION Right 08/02/2014   Procedure: BREAST LUMPECTOMY WITH RADIOACTIVE SEED LOCALIZATION;  Surgeon: Donnice Bury, MD;  Location: [facility];  Service: General;  Laterality: Right;   COLONOSCOPY  08/12/2012   Completed by Dr. Luba, normal exam.    COLONOSCOPY WITH PROPOFOL  N/A 11/01/2022   Procedure: COLONOSCOPY WITH PROPOFOL ;  Surgeon: Jinny Carmine, MD;  Location: [facility];  Service: Endoscopy;  Laterality: N/A;   CYSTOSCOPY W/ URETERAL STENT PLACEMENT Left 11/14/2023   Procedure: CYSTOSCOPY, WITH RETROGRADE PYELOGRAM AND URETERAL STENT INSERTION;  Surgeon: Georganne Penne SAUNDERS, MD;  Location: [facility] OR[facility];  Service: Urology;  Laterality: Left;  ENDOMETRIAL BIOPSY  01/2004   ESOPHAGOGASTRODUODENOSCOPY     ESOPHAGOGASTRODUODENOSCOPY (EGD) WITH PROPOFOL  N/A 08/18/2019   Procedure: ESOPHAGOGASTRODUODENOSCOPY (EGD) WITH PROPOFOL ;  Surgeon: Jinny Carmine, MD;  Location: [facility];  Service: Endoscopy;  Laterality: N/A;   ESOPHAGOGASTRODUODENOSCOPY (EGD) WITH PROPOFOL  N/A 11/01/2022   Procedure: ESOPHAGOGASTRODUODENOSCOPY (EGD) WITH PROPOFOL ;  Surgeon: Jinny Carmine, MD;  Location: [facility];  Service: Endoscopy;  Laterality: N/A;   FOOT SURGERY     SEPTOPLASTY     two times   TONSILLECTOMY     UTERINE FIBROID SURGERY  06/2005   ablation   uterine tumor  09/2001   VAGUS NERVE STIMULATOR INSERTION     Social History   Tobacco Use   Smoking status: Never   Smokeless tobacco: Never  Vaping Use   Vaping status: Never Used  Substance Use Topics   Alcohol use: No    Alcohol/week: 0.0 standard drinks of alcohol   Drug use: No   Family History  Problem Relation Age of Onset   Hypertension Father    Cancer  Father        pancreatic cancer   Hypertension Sister    Heart disease Maternal Grandmother 80       MI   Diabetes Maternal Grandmother    Heart disease Paternal Grandmother 52       MI   Diabetes Paternal Grandmother    Breast cancer Neg Hx    Allergies  Allergen Reactions   Cephalexin    Codeine     rash   Duloxetine    Levaquin  [Levofloxacin ] Nausea Only   Lisinopril Cough   Metformin  And Related Diarrhea    GI side effects    Quetiapine Other (See Comments)   Sertraline Hcl     REACTION: vivid dreams   Triazolam     REACTION: ? reaction   Zaleplon    Zocor [Simvastatin - High Dose]     achey   Zolpidem Tartrate     hallucinations   Zyrtec [Cetirizine]     Sedation    Hydrocodone  Itching and Rash    REACTION: redness rash itching   Norelgestromin-Eth Estradiol      Patch didn't stick to skin   Current Outpatient Medications on File Prior to Visit  Medication Sig Dispense Refill   acetaminophen  (TYLENOL ) 500 MG tablet Take 500 mg by mouth every 6 (six) hours as needed.      albuterol  (VENTOLIN  HFA) 108 (90 Base) MCG/ACT inhaler INHALE 2 PUFFS INTO THE LUNGS EVERY 6 HOURS AS NEEDED FOR WHEEZING. 18 g 1   ALPRAZolam (XANAX) 1 MG tablet Take 2 mg by mouth at bedtime.     ammonium lactate  (AMLACTIN) 12 % cream Apply 1 Application topically as needed for dry skin. 385 g 5   BD PEN NEEDLE NANO U/F 32G X 4 MM MISC daily.     Blood Glucose Monitoring Suppl (FIFTY50 GLUCOSE METER 2.0) w/Device KIT Use as directed     cephALEXin (KEFLEX) 500 MG capsule Take 1 capsule (500 mg total) by mouth 3 (three) times daily for 10 days. 30 capsule 0   Continuous Glucose Sensor (FREESTYLE LIBRE 3 PLUS SENSOR) MISC      cyclobenzaprine  (FLEXERIL ) 5 MG tablet TAKE 1 TABLET BY MOUTH 3 TIMES DAILY AS NEEDED FOR MUSCLE SPASMS 90 tablet 0   desvenlafaxine (PRISTIQ) 50 MG 24 hr tablet Take 50 mg by mouth daily.     dimenhyDRINATE (DRAMAMINE) 50 MG tablet Take 50 mg by mouth every 8 (  eight) hours  as needed.     docusate sodium  (COLACE) 100 MG capsule TAKE ONE CAPSULE BY MOUTH TWICE DAILY. 180 capsule 1   famotidine  (PEPCID ) 20 MG tablet Take 1 tablet (20 mg total) by mouth 2 (two) times daily as needed for heartburn or indigestion (for acid reflux). 60 tablet 1   FLUoxetine (PROZAC) 20 MG capsule Take 20 mg by mouth daily.     fluticasone  (FLONASE ) 50 MCG/ACT nasal spray Place into both nostrils daily.     gabapentin  (NEURONTIN ) 100 MG capsule Take 1 capsule (100 mg total) by mouth daily. With breakfast 90 capsule 1   gabapentin  (NEURONTIN ) 300 MG capsule Take 1 capsule (300 mg total) by mouth at bedtime. 90 capsule 1   hydrocortisone  (ANUSOL -HC) 2.5 % rectal cream Apply to affected (hemorrhoid area) once daily at bedtime as needed for no more than 10 days in a row 30 g 0   ketoconazole  (NIZORAL ) 2 % cream Apply 1 Application topically daily as needed for irritation. 30 g 0   linaclotide  (LINZESS ) 290 MCG CAPS capsule TAKE 1 CAPSULE BY MOUTH ONCE DAILY BEFORE BREAKFAST 90 capsule 1   losartan (COZAAR) 25 MG tablet      minoxidil  (LONITEN ) 2.5 MG tablet Take 1 tablet (2.5 mg total) by mouth daily. 90 tablet 1   mometasone  (ELOCON ) 0.1 % cream Apply to affected area left thigh twice daily as needed for flares/itch. Avoid applying to face, groin, and axilla. Use as directed. Long-term use can cause thinning of the skin. 45 g 1   MOUNJARO 12.5 MG/0.5ML Pen Inject 12.5 mg into the skin once a week.     omeprazole  (PRILOSEC) 40 MG capsule Take 1 capsule (40 mg total) by mouth in the morning and at bedtime. 180 capsule 2   oxybutynin  (DITROPAN  XL) 15 MG 24 hr tablet Take 1 tablet (15 mg total) by mouth daily. 90 tablet 1   Polyethyl Glycol-Propyl Glycol (SYSTANE OP) Apply 1 drop to eye daily as needed.     pravastatin  (PRAVACHOL ) 20 MG tablet Take 1 tablet (20 mg total) by mouth at bedtime. 90 tablet 0   Simethicone (GAS-X PO) Take by mouth as needed.     spironolactone  (ALDACTONE ) 100 MG tablet  TAKE 1 TABLET BY MOUTH DAILY 90 tablet 1   traMADol  (ULTRAM ) 50 MG tablet Take 1 tablet (50 mg total) by mouth every 12 (twelve) hours as needed for severe pain (pain score 7-10). Caution of sedation 30 tablet 0   traZODone (DESYREL) 150 MG tablet Take 300 mg by mouth at bedtime.     TRESIBA FLEXTOUCH 100 UNIT/ML FlexTouch Pen Inject 16 Units into the skin daily.     zinc oxide (BALMEX) 11.3 % CREA cream Apply 1 application topically 2 (two) times daily.     No current facility-administered medications on file prior to visit.    Review of Systems  Constitutional:  Positive for fatigue. Negative for activity change, appetite change, fever and unexpected weight change.  HENT:  Positive for congestion, postnasal drip, rhinorrhea, sinus pressure and sinus pain. Negative for ear pain and sore throat.   Eyes:  Negative for pain, redness and visual disturbance.  Respiratory:  Positive for cough. Negative for shortness of breath and wheezing.   Cardiovascular:  Negative for chest pain and palpitations.  Gastrointestinal:  Negative for abdominal pain, blood in stool, constipation and diarrhea.  Endocrine: Negative for polydipsia and polyuria.  Genitourinary:  Negative for dysuria, frequency, hematuria and urgency.  Flank pain persists from renal stone-is improved   Musculoskeletal:  Positive for back pain. Negative for arthralgias and myalgias.  Skin:  Negative for pallor and rash.  Allergic/Immunologic: Negative for environmental allergies.  Neurological:  Negative for dizziness, syncope and headaches.  Hematological:  Negative for adenopathy. Does not bruise/bleed easily.  Psychiatric/Behavioral:  Positive for dysphoric mood. Negative for decreased concentration. The patient is nervous/anxious.        Objective:   Physical Exam Constitutional:      General: She is not in acute distress.    Appearance: Normal appearance. She is well-developed. She is obese. She is not ill-appearing or  diaphoretic.  HENT:     Head: Normocephalic and atraumatic.     Comments: Bilateral maxillary and frontal sinus tenderness     Right Ear: Tympanic membrane, ear canal and external ear normal.     Left Ear: Tympanic membrane, ear canal and external ear normal.     Nose: Congestion and rhinorrhea present.     Comments: Mildly injected nares     Mouth/Throat:     Pharynx: Oropharynx is clear. No oropharyngeal exudate or posterior oropharyngeal erythema.     Comments: Clear pnd Eyes:     General:        Right eye: No discharge.        Left eye: No discharge.     Conjunctiva/sclera: Conjunctivae normal.     Pupils: Pupils are equal, round, and reactive to light.  Neck:     Thyroid : No thyromegaly.     Vascular: No carotid bruit or JVD.  Cardiovascular:     Rate and Rhythm: Normal rate and regular rhythm.     Heart sounds: Normal heart sounds.     No gallop.  Pulmonary:     Effort: Pulmonary effort is normal. No respiratory distress.     Breath sounds: Normal breath sounds. No stridor. No wheezing, rhonchi or rales.     Comments: Good air exch No rales or rhonchi Abdominal:     General: There is no distension or abdominal bruit.     Palpations: Abdomen is soft.  Musculoskeletal:     Cervical back: Normal range of motion and neck supple.     Right lower leg: No edema.     Left lower leg: No edema.  Lymphadenopathy:     Cervical: No cervical adenopathy.  Skin:    General: Skin is warm and dry.     Coloration: Skin is not pale.     Findings: No rash.  Neurological:     Mental Status: She is alert.     Cranial Nerves: No cranial nerve deficit.     Coordination: Coordination normal.     Deep Tendon Reflexes: Reflexes are normal and symmetric.  Psychiatric:        Mood and Affect: Mood is anxious.     Comments: Candidly discusses symptoms and stressors             Assessment & Plan:   Problem List Items Addressed This Visit       Respiratory   Sinusitis - Primary    More congestion (yellow mucous) with sinus pressure/pain since her hosp for renal stone Reassuring exam Neg covid test   Already on keflex-would not add antibiotic Encouraged to continue flonase  Try saline/netti pot as well   Update if not starting to improve in a week or if worsening  Call back and Er precautions noted in detail today  Pulmonary nodule   Incidental finding in hospital -then CT chest done  Several nodule noted on CT resembling pulmonary hamartomas  No symptoms beyond mild chronic cough   Plan 6-12 month re check to document stabilithy          Digestive   Chronic constipation   Pt seems to do best alternating linzess  290 with 145 every other day  Encouraged more water intake   Currently diarrhea on keflex -can hold linzess  until this resolves         Endocrine   Hyperlipidemia associated with type 2 diabetes mellitus (HCC)   Lab today  Continues pravastatin    Disc goals for lipids and reasons to control them Rev last labs with pt Rev low sat fat diet in detail       Relevant Orders   Lipid panel   Comprehensive metabolic panel with GFR     Genitourinary   Acute cystitis without hematuria   E coli- occurred with renal stone  Reviewed hospital records, lab results and studies in detail   Continues keflex 500 mg tid (causing some diarrhea-will watch this)  Encouraged more water intake  Follow up with urology as planned   Cbc today in light of elevated wbc in hospital       Relevant Orders   CBC with Differential/Platelet     Other   COUGH, CHRONIC   Relevant Orders   POC COVID-19 (Completed)

## 2023-11-22 NOTE — Assessment & Plan Note (Signed)
 Pt seems to do best alternating linzess  290 with 145 every other day  Encouraged more water intake   Currently diarrhea on keflex -can hold linzess  until this resolves

## 2023-11-22 NOTE — Patient Instructions (Addendum)
 You may not need linzess  while on your antibiotic (keflex)  If diarrhea worsens let us  know   You will need follow up of the pulmonary nodules in 6-12 months to document stability   Continue/finish the keflex   The keflex has good coverage for sinus infection  For sinus pressure - continue the flonase   You can also use nasal saline and netti pot for congestion  An expectorant like mucinex (plain) may also help the congestion  Keep us  posted   Keep fluid intake the best you can

## 2023-11-24 ENCOUNTER — Ambulatory Visit: Payer: Self-pay | Admitting: Family Medicine

## 2023-11-25 ENCOUNTER — Other Ambulatory Visit

## 2023-11-25 DIAGNOSIS — N200 Calculus of kidney: Secondary | ICD-10-CM

## 2023-11-25 LAB — MICROSCOPIC EXAMINATION

## 2023-11-25 LAB — URINALYSIS, COMPLETE
Bilirubin, UA: NEGATIVE
Glucose, UA: NEGATIVE
Ketones, UA: NEGATIVE
Nitrite, UA: NEGATIVE
Protein,UA: NEGATIVE
Specific Gravity, UA: 1.01 (ref 1.005–1.030)
Urobilinogen, Ur: 0.2 mg/dL (ref 0.2–1.0)
pH, UA: 6 (ref 5.0–7.5)

## 2023-11-26 ENCOUNTER — Encounter: Attending: Family Medicine | Admitting: Dietician

## 2023-11-26 ENCOUNTER — Ambulatory Visit: Payer: Self-pay | Admitting: Urology

## 2023-11-26 DIAGNOSIS — Z713 Dietary counseling and surveillance: Secondary | ICD-10-CM | POA: Diagnosis not present

## 2023-11-26 DIAGNOSIS — E119 Type 2 diabetes mellitus without complications: Secondary | ICD-10-CM | POA: Diagnosis not present

## 2023-11-26 LAB — STONE ANALYSIS
Calcium Oxalate Monohydrate: 50 %
Uric Acid Calculi: 50 %
Weight Calculi: 22 mg

## 2023-11-26 MED ORDER — SULFAMETHOXAZOLE-TRIMETHOPRIM 800-160 MG PO TABS
1.0000 | ORAL_TABLET | Freq: Two times a day (BID) | ORAL | 0 refills | Status: AC
Start: 2023-11-26 — End: 2023-12-03

## 2023-11-26 NOTE — Progress Notes (Unsigned)
 Diabetes Self-Management Education  Visit Type: First/Initial  Appt. Start Time: 1405 Appt. End Time: 1520  11/27/2023  [name], identified by name and date of birth, is a 58 y.o. female with a diagnosis of Diabetes: Type 2.   ASSESSMENT Pt reports recent hospitalization for kidney stones, had stent placed, will be getting stent removed next week and further procedures to break up remaining stones in the near future. Pt reports taking Mounjaro @12 .5 mg for the past 1.5 years, also taking [place] @16u  recently reduced from 36u r/t frequent hypoglycemia. Pt reports weight loss of ~100 lbs over the last year and a half  Pt reports using Freestyle Libre3 CGM: CGM Results from download:   % Time CGM active:   100 %   (Goal >70%)  Average glucose:   79 mg/dL for 90 days  Time in range (70-180 mg/dL):   80 %   (Goal >29%)  Time High (181-250 mg/dL):   0 %   (Goal < 74%)  Time Very High (>250 mg/dL):    0 %   (Goal < 5%)  Time Low (54-69 mg/dL):   20 %   (Goal <5%)  Time Very Low (<54 mg/dL):   0 %   (Goal <8%)  Daily patterns indicate very small fluctuations around ~80 mg/dL, no significant elevations in glucose from meals, pt states they are avoiding carbohydrates as much as possible.  Pt reports desire for diabetes refresher, wants to know what to eat for glycemic control. Pt reports history of eating disorder in their youth, reports fear of eating/weight gain, states they are afraid of losing control if they eat sweets. Pt reports getting 4 meals a week from a keto friendly restaurant ([employer]), will usually eat half at a time, reports varying meal pattern   Diabetes Self-Management Education - 11/22/23 0951       Visit Information   Visit Type First/Initial      Initial Visit   Diabetes Type Type 2      Psychosocial Assessment   Patient Belief/Attitude about Diabetes Motivated to manage diabetes    What is the hardest part about your diabetes right now,  causing you the most concern, or is the most worrisome to you about your diabetes?   Other (comment)   Eating carbs, elevating glucose   Self-care barriers Debilitated state due to current medical condition    Self-management support Doctor's office;Family    Other persons present Patient    Patient Concerns Glycemic Control;Nutrition/Meal planning    Special Needs None    Preferred Learning Style Visual;Hands on    Learning Readiness Contemplating    How often do you need to have someone help you when you read instructions, pamphlets, or other written materials from your doctor or pharmacy? 1 - Never      Pre-Education Assessment   Patient understands the diabetes disease and treatment process. Needs Review    Patient understands incorporating nutritional management into lifestyle. Needs Review    Patient undertands incorporating physical activity into lifestyle. Needs Review    Patient understands using medications safely. Needs Review    Patient understands monitoring blood glucose, interpreting and using results Needs Review    Patient understands prevention, detection, and treatment of acute complications. Needs Review    Patient understands prevention, detection, and treatment of chronic complications. Needs Review    Patient understands how to develop strategies to address psychosocial issues. Needs Review    Patient understands how to develop  strategies to promote health/change behavior. Needs Review      Complications   Last HgB A1C per patient/outside source 4.4 %   11/14/2023   How often do you check your blood sugar? > 4 times/day    Fasting Blood glucose range (mg/dL) <29    Postprandial Blood glucose range (mg/dL) <29;29-870    Number of hypoglycemic episodes per month 36    Can you tell when your blood sugar is low? Yes    What do you do if your blood sugar is low? drinks water    Number of hyperglycemic episodes ( >200mg /dL): Never      Dietary Intake   Breakfast None     Snack (afternoon) Sweet potatoes    Snack (evening) 2-3 Ritz PB crackers      Activity / Exercise   Activity / Exercise Type ADL's      Patient Education   Disease Pathophysiology Explored patient's options for treatment of their diabetes    Healthy Eating Role of diet in the treatment of diabetes and the relationship between the three main macronutrients and blood glucose level;Reviewed blood glucose goals for pre and post meals and how to evaluate the patients' food intake on their blood glucose level.;Carbohydrate counting;Meal options for control of blood glucose level and chronic complications.   Increase carbohydrate intake   Being Active Identified with patient nutritional and/or medication changes necessary with exercise.    Medications Taught/reviewed insulin/injectables, injection, site rotation, insulin/injectables storage and needle disposal.;Reviewed patients medication for diabetes, action, purpose, timing of dose and side effects.    Monitoring Taught/evaluated CGM (comment);Interpreting lab values - A1C, lipid, urine microalbumina.   TIR, Daily Patterns   Chronic complications Relationship between chronic complications and blood glucose control    Diabetes Stress and Support Identified and addressed patients feelings and concerns about diabetes;Worked with patient to identify barriers to care and solutions   Fear of weight gain and post-prandial glucose elevations   Lifestyle and Health Coping Lifestyle issues that need to be addressed for better diabetes care      Individualized Goals (developed by patient)   Nutrition Follow meal plan discussed    Physical Activity Not Applicable    Medications take my medication as prescribed    Monitoring  Consistenly use CGM    Problem Solving Eating Pattern;Addressing barriers to behavior change   Fear of eating, history of ED   Reducing Risk examine blood glucose patterns;treat hypoglycemia with 15 grams of carbs if blood glucose less  than 70mg /dL    Health Coping Ask for help with psychological, social, or emotional issues      Post-Education Assessment   Patient understands the diabetes disease and treatment process. Comprehends key points    Patient understands incorporating nutritional management into lifestyle. Needs Review    Patient undertands incorporating physical activity into lifestyle. N/A    Patient understands using medications safely. Comphrehends key points    Patient understands monitoring blood glucose, interpreting and using results Needs Review    Patient understands prevention, detection, and treatment of acute complications. Needs Review   Avoidinance of hypoglycemia   Patient understands prevention, detection, and treatment of chronic complications. Comprehends key points    Patient understands how to develop strategies to address psychosocial issues. Needs Review    Patient understands how to develop strategies to promote health/change behavior. Needs Review      Outcomes   Expected Outcomes Demonstrated interest in learning but significant barriers to change  Future DMSE 4-6 wks    Program Status Not Completed          Individualized Plan for Diabetes Self-Management Training:   Learning Objective:  Patient will have a greater understanding of diabetes self-management. Patient education plan is to attend individual and/or group sessions per assessed needs and concerns.   Plan:   Patient Instructions  Visit [email or web address].no for a large amount of diabetic friendly meal and snack recipes!  Begin to introduce some carbohydrates back into your meals.  Try to to have 30-45g of carbs at each meal.  Look for your blood sugar to rise ~40-50 points at most from your meals after 2 hours!  Pay attention to symptoms of low blood sugar (dizziness, confusion, shakiness, sweatiness) and treat it with fast acting sugar (4 oz of soda/juice, 3-4 pieces of candy).        Expected Outcomes:   Demonstrated interest in learning but significant barriers to change  Education material provided: Carbohydrate counting sheet and Diabetes Resources  If problems or questions, patient to contact team via:  Phone and Email  Future DSME appointment: 4-6 wks

## 2023-11-26 NOTE — Patient Instructions (Addendum)
 Visit [email or web address].no for a large amount of diabetic friendly meal and snack recipes!  Begin to introduce some carbohydrates back into your meals.  Try to to have 30-45g of carbs at each meal.  Look for your blood sugar to rise ~40-50 points at most from your meals after 2 hours!  Pay attention to symptoms of low blood sugar (dizziness, confusion, shakiness, sweatiness) and treat it with fast acting sugar (4 oz of soda/juice, 3-4 pieces of candy).

## 2023-11-26 NOTE — Progress Notes (Signed)
 Please have her begin taking antibiotics 7 days prior to her surgery date. This is for pre-op UA with an indwelling stent in place. Thank you!

## 2023-11-27 ENCOUNTER — Encounter: Payer: Self-pay | Admitting: Dietician

## 2023-11-27 ENCOUNTER — Other Ambulatory Visit: Payer: Self-pay

## 2023-11-27 ENCOUNTER — Encounter
Admission: RE | Admit: 2023-11-27 | Discharge: 2023-11-27 | Disposition: A | Discharge status: Home / Self Care | Source: Ambulatory Visit | Attending: Urology | Admitting: Urology

## 2023-11-27 VITALS — Ht 64.0 in | Wt 220.0 lb

## 2023-11-27 DIAGNOSIS — Z01810 Encounter for preprocedural cardiovascular examination: Secondary | ICD-10-CM

## 2023-11-27 DIAGNOSIS — E119 Type 2 diabetes mellitus without complications: Secondary | ICD-10-CM

## 2023-11-27 NOTE — Patient Instructions (Addendum)
 Your procedure is scheduled nw:ULZDIJB OCTOBER 21 Report to the Registration Desk on the 1st floor of the [facility]. To find out your arrival time, please call [phone] between 1PM - 3PM on:  MONDAY OCTOBER 20  If your arrival time is 6:00 am, do not arrive before that time as the [facility] entrance doors do not open until 6:00 am.  REMEMBER: Instructions that are not followed completely may result in serious medical risk, up to and including death; or upon the discretion of your surgeon and anesthesiologist your surgery may need to be rescheduled.  Do not eat food after midnight the night before surgery.  No gum chewing or hard candies.  One week prior to surgery: Stop Anti-inflammatories (NSAIDS) such as Advil , Aleve , Ibuprofen , Motrin , Naproxen , Naprosyn  and Aspirin based products such as Excedrin, Goody's Powder, BC Powder. Stop ANY OVER THE COUNTER supplements until after surgery. fluticasone  (FLONASE )  You may however, continue to take Tylenol  if needed for pain up until the day of surgery.  **Follow guidelines for insulin and diabetes medications.** MOUNJARO hold 7 days prior to surgery, last dose Monday October 13  [facility] contact Dr. Damian regarding your insulin dosage for approximate 30 minute surgery   Continue taking all of your other prescription medications up until the day of surgery.  ON THE DAY OF SURGERY ONLY TAKE THESE MEDICATIONS WITH SIPS OF WATER:  desvenlafaxine (PRISTIQ) famotidine  (PEPCID )  FLUoxetine (PROZAC)  gabapentin  (NEURONTIN )  omeprazole  (PRILOSEC)   No Alcohol for 24 hours before or after surgery.  Do not use any recreational drugs for at least a week (preferably 2 weeks) before your surgery.  Please be advised that the combination of cocaine and anesthesia may have negative outcomes, up to and including death. If you test positive for cocaine, your surgery will be cancelled.  On the morning of surgery brush your teeth  with toothpaste and water, you may rinse your mouth with mouthwash if you wish. Do not swallow any toothpaste or mouthwash.  Do not wear jewelry, make-up, hairpins, clips or nail polish.  For welded (permanent) jewelry: bracelets, anklets, waist bands, etc.  Please have this removed prior to surgery.  If it is not removed, there is a chance that hospital personnel will need to cut it off on the day of surgery.  Do not wear lotions, powders, or perfumes.   Do not shave body hair from the neck down 48 hours before surgery.  Contact lenses, hearing aids and dentures may not be worn into surgery.  Do not bring valuables to the hospital. [facility] is not responsible for any missing/lost belongings or valuables.   Notify your doctor if there is any change in your medical condition (cold, fever, infection).  Wear comfortable clothing (specific to your surgery type) to the hospital.  After surgery, you can help prevent lung complications by doing breathing exercises.  Take deep breaths and cough every 1-2 hours.   If you are being discharged the day of surgery, you will not be allowed to drive home. You will need a responsible individual to drive you home and stay with you for 24 hours after surgery.   If you are taking public transportation, you will need to have a responsible individual with you.  Please call the Pre-admissions Testing Dept. at [phone] if you have any questions about these instructions.  Surgery Visitation Policy:  Patients having surgery or a procedure may have two visitors.  Children under the age of 70  must have an adult with them who is not the patient.  Merchandiser, retail to address health-related social needs:  [email or web address]

## 2023-11-30 LAB — CULTURE, URINE COMPREHENSIVE

## 2023-11-30 NOTE — Anesthesia Postprocedure Evaluation (Signed)
 Anesthesia Post Note  Patient: Mackenzie Bonilla  Procedure(s) Performed: CYSTOSCOPY, WITH RETROGRADE PYELOGRAM AND URETERAL STENT INSERTION (Left: Ureter)  Patient location during evaluation: PACU Anesthesia Type: General Level of consciousness: awake and alert Pain management: pain level controlled Vital Signs Assessment: post-procedure vital signs reviewed and stable Respiratory status: spontaneous breathing, nonlabored ventilation, respiratory function stable and patient connected to nasal cannula oxygen Cardiovascular status: blood pressure returned to baseline and stable Postop Assessment: no apparent nausea or vomiting Anesthetic complications: no   No notable events documented.   Last Vitals:  Vitals:   11/15/23 0404 11/15/23 0758  BP: (!) 85/53 (!) 106/45  Pulse: 66 (!) 57  Resp: 17 16  Temp: (!) 36.3 C 36.7 C  SpO2: 96% 97%    Last Pain:  Vitals:   11/15/23 0720  TempSrc:   PainSc: 0-No pain                 Prentice Murphy

## 2023-12-03 ENCOUNTER — Encounter: Payer: Self-pay | Admitting: Urology

## 2023-12-03 ENCOUNTER — Ambulatory Visit: Payer: Self-pay | Admitting: Urgent Care

## 2023-12-03 ENCOUNTER — Other Ambulatory Visit: Payer: Self-pay

## 2023-12-03 ENCOUNTER — Encounter
Admission: RE | Disposition: A | Discharge status: Home / Self Care | Payer: Self-pay | Source: Home / Self Care | Attending: Urology

## 2023-12-03 ENCOUNTER — Ambulatory Visit

## 2023-12-03 ENCOUNTER — Ambulatory Visit
Admission: RE | Admit: 2023-12-03 | Discharge: 2023-12-03 | Disposition: A | Discharge status: Home / Self Care | Attending: Urology | Admitting: Urology

## 2023-12-03 DIAGNOSIS — E1122 Type 2 diabetes mellitus with diabetic chronic kidney disease: Secondary | ICD-10-CM | POA: Diagnosis not present

## 2023-12-03 DIAGNOSIS — E781 Pure hyperglyceridemia: Secondary | ICD-10-CM | POA: Diagnosis not present

## 2023-12-03 DIAGNOSIS — Z6836 Body mass index (BMI) 36.0-36.9, adult: Secondary | ICD-10-CM | POA: Insufficient documentation

## 2023-12-03 DIAGNOSIS — E66813 Obesity, class 3: Secondary | ICD-10-CM | POA: Insufficient documentation

## 2023-12-03 DIAGNOSIS — F418 Other specified anxiety disorders: Secondary | ICD-10-CM | POA: Diagnosis not present

## 2023-12-03 DIAGNOSIS — G4730 Sleep apnea, unspecified: Secondary | ICD-10-CM | POA: Insufficient documentation

## 2023-12-03 DIAGNOSIS — N189 Chronic kidney disease, unspecified: Secondary | ICD-10-CM | POA: Insufficient documentation

## 2023-12-03 DIAGNOSIS — N200 Calculus of kidney: Secondary | ICD-10-CM

## 2023-12-03 DIAGNOSIS — E1151 Type 2 diabetes mellitus with diabetic peripheral angiopathy without gangrene: Secondary | ICD-10-CM | POA: Diagnosis not present

## 2023-12-03 DIAGNOSIS — E119 Type 2 diabetes mellitus without complications: Secondary | ICD-10-CM

## 2023-12-03 DIAGNOSIS — Z01810 Encounter for preprocedural cardiovascular examination: Secondary | ICD-10-CM

## 2023-12-03 DIAGNOSIS — N132 Hydronephrosis with renal and ureteral calculous obstruction: Secondary | ICD-10-CM | POA: Diagnosis not present

## 2023-12-03 HISTORY — PX: CYSTOSCOPY/URETEROSCOPY/HOLMIUM LASER/STENT PLACEMENT: SHX6546

## 2023-12-03 LAB — GLUCOSE, CAPILLARY
Glucose-Capillary: 79 mg/dL (ref 70–99)
Glucose-Capillary: 82 mg/dL (ref 70–99)

## 2023-12-03 SURGERY — CYSTOSCOPY/URETEROSCOPY/HOLMIUM LASER/STENT PLACEMENT
Anesthesia: General | Site: Ureter | Laterality: Left

## 2023-12-03 MED ORDER — FENTANYL CITRATE (PF) 100 MCG/2ML IJ SOLN: 25.0000 ug | INTRAMUSCULAR | Status: DC | PRN

## 2023-12-03 MED ORDER — ACETAMINOPHEN 10 MG/ML IV SOLN
INTRAVENOUS | Status: DC | PRN
Start: 2023-12-03 — End: 2023-12-03
  Administered 2023-12-03: 1000 mg via INTRAVENOUS

## 2023-12-03 MED ORDER — PROPOFOL 10 MG/ML IV BOLUS
INTRAVENOUS | Status: AC
  Filled 2023-12-03: qty 20

## 2023-12-03 MED ORDER — ORAL CARE MOUTH RINSE: 15.0000 mL | Freq: Once | OROMUCOSAL | Status: AC

## 2023-12-03 MED ORDER — CHLORHEXIDINE GLUCONATE 0.12 % MT SOLN
15.0000 mL | Freq: Once | OROMUCOSAL | Status: AC
  Administered 2023-12-03: 15 mL via OROMUCOSAL

## 2023-12-03 MED ORDER — LIDOCAINE HCL (CARDIAC) PF 100 MG/5ML IV SOSY
PREFILLED_SYRINGE | INTRAVENOUS | Status: DC | PRN
  Administered 2023-12-03: 100 mg via INTRAVENOUS

## 2023-12-03 MED ORDER — SODIUM CHLORIDE 0.9 % IV SOLN: INTRAVENOUS | Status: DC | PRN

## 2023-12-03 MED ORDER — LIDOCAINE HCL (PF) 2 % IJ SOLN
INTRAMUSCULAR | Status: AC
  Filled 2023-12-03: qty 5

## 2023-12-03 MED ORDER — MIDAZOLAM HCL (PF) 2 MG/2ML IJ SOLN
INTRAMUSCULAR | Status: DC | PRN
  Administered 2023-12-03: 2 mg via INTRAVENOUS

## 2023-12-03 MED ORDER — PROPOFOL 10 MG/ML IV BOLUS
INTRAVENOUS | Status: DC | PRN
  Administered 2023-12-03: 100 ug/kg/min via INTRAVENOUS
  Administered 2023-12-03: 150 mg via INTRAVENOUS

## 2023-12-03 MED ORDER — TRAMADOL HCL 50 MG PO TABS: 50.0000 mg | ORAL_TABLET | Freq: Four times a day (QID) | ORAL | 0 refills | Status: AC | PRN

## 2023-12-03 MED ORDER — SODIUM CHLORIDE 0.9 % IR SOLN
Status: DC | PRN
  Administered 2023-12-03: 3000 mL via INTRAVESICAL

## 2023-12-03 MED ORDER — OXYCODONE HCL 5 MG/5ML PO SOLN: 5.0000 mg | Freq: Once | ORAL | Status: AC | PRN

## 2023-12-03 MED ORDER — TAMSULOSIN HCL 0.4 MG PO CAPS: 0.4000 mg | ORAL_CAPSULE | Freq: Every day | ORAL | 0 refills | Status: AC

## 2023-12-03 MED ORDER — CHLORHEXIDINE GLUCONATE 0.12 % MT SOLN
OROMUCOSAL | Status: AC
Start: 2023-12-03 — End: 2023-12-03
  Filled 2023-12-03: qty 15

## 2023-12-03 MED ORDER — OXYCODONE HCL 5 MG PO TABS
5.0000 mg | ORAL_TABLET | Freq: Once | ORAL | Status: AC | PRN
  Administered 2023-12-03: 5 mg via ORAL

## 2023-12-03 MED ORDER — OXYBUTYNIN CHLORIDE ER 5 MG PO TB24: 5.0000 mg | ORAL_TABLET | Freq: Every day | ORAL | 0 refills | Status: AC

## 2023-12-03 MED ORDER — ONDANSETRON HCL 4 MG/2ML IJ SOLN
INTRAMUSCULAR | Status: AC
  Filled 2023-12-03: qty 2

## 2023-12-03 MED ORDER — PHENAZOPYRIDINE HCL 200 MG PO TABS: 200.0000 mg | ORAL_TABLET | Freq: Three times a day (TID) | ORAL | 0 refills | Status: AC | PRN

## 2023-12-03 MED ORDER — IOHEXOL 180 MG/ML  SOLN
INTRAMUSCULAR | Status: DC | PRN
  Administered 2023-12-03 (×2): 10 mL

## 2023-12-03 MED ORDER — GLYCOPYRROLATE 0.2 MG/ML IJ SOLN
INTRAMUSCULAR | Status: AC
  Filled 2023-12-03: qty 1

## 2023-12-03 MED ORDER — DEXAMETHASONE SOD PHOSPHATE PF 10 MG/ML IJ SOLN
INTRAMUSCULAR | Status: DC | PRN
  Administered 2023-12-03: 10 mg via INTRAVENOUS

## 2023-12-03 MED ORDER — KETOROLAC TROMETHAMINE 30 MG/ML IJ SOLN
INTRAMUSCULAR | Status: AC
Start: 2023-12-03 — End: 2023-12-03
  Filled 2023-12-03: qty 1

## 2023-12-03 MED ORDER — PROPOFOL 1000 MG/100ML IV EMUL
INTRAVENOUS | Status: AC
  Filled 2023-12-03: qty 100

## 2023-12-03 MED ORDER — ONDANSETRON HCL 4 MG/2ML IJ SOLN
INTRAMUSCULAR | Status: DC | PRN
  Administered 2023-12-03: 4 mg via INTRAVENOUS

## 2023-12-03 MED ORDER — CEFAZOLIN SODIUM-DEXTROSE 2-4 GM/100ML-% IV SOLN
INTRAVENOUS | Status: AC
  Filled 2023-12-03: qty 100

## 2023-12-03 MED ORDER — OXYCODONE HCL 5 MG PO TABS
ORAL_TABLET | ORAL | Status: AC
  Filled 2023-12-03: qty 1

## 2023-12-03 MED ORDER — DROPERIDOL 2.5 MG/ML IJ SOLN: 0.6250 mg | Freq: Once | INTRAMUSCULAR | Status: DC | PRN

## 2023-12-03 MED ORDER — CEFAZOLIN SODIUM-DEXTROSE 2-4 GM/100ML-% IV SOLN
2.0000 g | INTRAVENOUS | Status: AC
  Administered 2023-12-03: 2 g via INTRAVENOUS

## 2023-12-03 MED ORDER — ROCURONIUM BROMIDE 10 MG/ML (PF) SYRINGE
PREFILLED_SYRINGE | INTRAVENOUS | Status: AC
  Filled 2023-12-03: qty 10

## 2023-12-03 MED ORDER — GLYCOPYRROLATE 0.2 MG/ML IJ SOLN: 0.1000 mg | Freq: Once | INTRAMUSCULAR | Status: DC

## 2023-12-03 MED ORDER — STERILE WATER FOR IRRIGATION IR SOLN
Status: DC | PRN
  Administered 2023-12-03: 500 mL

## 2023-12-03 MED ORDER — ROCURONIUM BROMIDE 100 MG/10ML IV SOLN
INTRAVENOUS | Status: DC | PRN
  Administered 2023-12-03: 20 mg via INTRAVENOUS
  Administered 2023-12-03: 50 mg via INTRAVENOUS

## 2023-12-03 MED ORDER — GLYCOPYRROLATE 0.2 MG/ML IJ SOLN
0.2000 mg | Freq: Once | INTRAMUSCULAR | Status: AC
  Administered 2023-12-03: 0.2 mg via INTRAVENOUS

## 2023-12-03 MED ORDER — DEXTROSE 50 % IV SOLN
INTRAVENOUS | Status: AC
  Filled 2023-12-03: qty 50

## 2023-12-03 MED ORDER — FENTANYL CITRATE (PF) 100 MCG/2ML IJ SOLN
INTRAMUSCULAR | Status: AC
  Filled 2023-12-03: qty 2

## 2023-12-03 MED ORDER — MIDAZOLAM HCL 2 MG/2ML IJ SOLN
INTRAMUSCULAR | Status: AC
  Filled 2023-12-03: qty 2

## 2023-12-03 MED ORDER — DEXTROSE 50 % IV SOLN
12.5000 g | Freq: Once | INTRAVENOUS | Status: AC
  Administered 2023-12-03: 12.5 g via INTRAVENOUS

## 2023-12-03 MED ORDER — FENTANYL CITRATE (PF) 100 MCG/2ML IJ SOLN
INTRAMUSCULAR | Status: DC | PRN
  Administered 2023-12-03 (×2): 50 ug via INTRAVENOUS

## 2023-12-03 MED ORDER — PHENYLEPHRINE 80 MCG/ML (10ML) SYRINGE FOR IV PUSH (FOR BLOOD PRESSURE SUPPORT)
PREFILLED_SYRINGE | INTRAVENOUS | Status: DC | PRN
  Administered 2023-12-03 (×2): 160 ug via INTRAVENOUS

## 2023-12-03 MED ORDER — SUGAMMADEX SODIUM 200 MG/2ML IV SOLN
INTRAVENOUS | Status: DC | PRN
  Administered 2023-12-03: 200 mg via INTRAVENOUS

## 2023-12-03 MED ORDER — KETOROLAC TROMETHAMINE 30 MG/ML IJ SOLN
INTRAMUSCULAR | Status: DC | PRN
Start: 2023-12-03 — End: 2023-12-03
  Administered 2023-12-03: 30 mg via INTRAVENOUS

## 2023-12-03 MED ORDER — ACETAMINOPHEN 10 MG/ML IV SOLN: 1000.0000 mg | Freq: Once | INTRAVENOUS | Status: DC | PRN

## 2023-12-03 SURGICAL SUPPLY — 26 items
ADHESIVE MASTISOL STRL (MISCELLANEOUS) IMPLANT
BAG DRAIN SIEMENS DORNER NS (MISCELLANEOUS) ×1 IMPLANT
BAG PRESSURE INF REUSE 3000 (BAG) ×1 IMPLANT
BASKET ZERO TIP 1.9FR (BASKET) IMPLANT
BRUSH SCRUB EZ 4% CHG (MISCELLANEOUS) ×1 IMPLANT
CATH URET FLEX-TIP 2 LUMEN 10F (CATHETERS) IMPLANT
CATH URETL OPEN 5X70 (CATHETERS) IMPLANT
DRAPE UTILITY 15X26 TOWEL STRL (DRAPES) ×1 IMPLANT
DRSG TEGADERM 2-3/8X2-3/4 SM (GAUZE/BANDAGES/DRESSINGS) IMPLANT
FIBER LASER MOSES 200 DFL (Laser) IMPLANT
FIBER LASER MOSES 365 DFL (Laser) IMPLANT
GLOVE BIOGEL PI IND STRL 7.0 (GLOVE) ×1 IMPLANT
GOWN STRL REUS W/ TWL LRG LVL3 (GOWN DISPOSABLE) ×2 IMPLANT
GUIDEWIRE STR DUAL SENSOR (WIRE) ×1 IMPLANT
GUIDEWIRE STRT TIP .038X150X3 (WIRE) IMPLANT
KIT TURNOVER CYSTO (KITS) ×1 IMPLANT
PACK CYSTO AR (MISCELLANEOUS) ×1 IMPLANT
SET CYSTO IRRIGATION (SET/KITS/TRAYS/PACK) ×1 IMPLANT
SHEATH NAVIGATOR HD 12/14X36 (SHEATH) IMPLANT
SOL .9 NS 3000ML IRR UROMATIC (IV SOLUTION) ×1 IMPLANT
STENT URET 6FRX24 CONTOUR (STENTS) IMPLANT
STENT URET 6FRX26 CONTOUR (STENTS) IMPLANT
SURGILUBE 2OZ TUBE FLIPTOP (MISCELLANEOUS) ×1 IMPLANT
SYR 10ML LL (SYRINGE) ×1 IMPLANT
VALVE UROSEAL ADJ ENDO (VALVE) IMPLANT
WATER STERILE IRR 500ML POUR (IV SOLUTION) ×1 IMPLANT

## 2023-12-03 NOTE — Anesthesia Postprocedure Evaluation (Signed)
 Anesthesia Post Note  Patient: Mackenzie Bonilla  Procedure(s) Performed: CYSTOSCOPY/URETEROSCOPY/HOLMIUM LASER/STENT EXCHANGE (Left: Ureter)  Patient location during evaluation: PACU Anesthesia Type: General Level of consciousness: awake and alert Pain management: pain level controlled Vital Signs Assessment: post-procedure vital signs reviewed and stable Respiratory status: spontaneous breathing, nonlabored ventilation, respiratory function stable and patient connected to nasal cannula oxygen Cardiovascular status: blood pressure returned to baseline and stable Postop Assessment: no apparent nausea or vomiting Anesthetic complications: no   There were no known notable events for this encounter.   Last Vitals:  Vitals:   12/03/23 1526 12/03/23 1532  BP: 124/72 (!) 102/59  Pulse: 79 88  Resp: 13 18  Temp:  (!) 36 C  SpO2: 97% 94%    Last Pain:  Vitals:   12/03/23 1532  TempSrc: Temporal  PainSc: 0-No pain                 Lynwood KANDICE Clause

## 2023-12-03 NOTE — Anesthesia Preprocedure Evaluation (Signed)
 Anesthesia Evaluation  Patient identified by MRN, date of birth, ID band Patient awake    Reviewed: Allergy & Precautions, H&P , NPO status , Patient's Chart, lab work & pertinent test results, reviewed documented beta blocker date and time   Airway Mallampati: II   Neck ROM: full    Dental  (+) Poor Dentition   Pulmonary asthma , sleep apnea    Pulmonary exam normal        Cardiovascular Exercise Tolerance: Poor + Peripheral Vascular Disease  Normal cardiovascular exam Rhythm:regular Rate:Normal     Neuro/Psych  PSYCHIATRIC DISORDERS Anxiety Depression     Neuromuscular disease    GI/Hepatic Neg liver ROS,GERD  Medicated,,  Endo/Other  diabetes  Class 3 obesity  Renal/GU CRFRenal disease  negative genitourinary   Musculoskeletal   Abdominal   Peds  Hematology  (+) Blood dyscrasia, anemia   Anesthesia Other Findings Past Medical History: No date: Allergy     Comment:  allergic rhinitis No date: Anemia No date: Anxiety No date: Arthritis     Comment:  osteoarthritis No date: Asthma No date: Claustrophobia     Comment:  does not like oxygen mask covering face No date: Depression No date: Diabetes mellitus without complication (HCC) 07/2008: Fatty liver     Comment:  abd. ultrasound - fatty liver ; slt dilated cbd (no               stones ) 06/10// abd.  ultrasound normal on 04/2006 No date: Fibromyalgia No date: GERD (gastroesophageal reflux disease)     Comment:  EGD negative 06/2001// EGD erythematous mucosa, polyp               08/2008 No date: High uric acid in 24 hour urine specimen No date: Hyperhidrosis No date: Hyperlipidemia No date: Kidney stones No date: Lymphedema No date: Obesity No date: PCOS (polycystic ovarian syndrome) No date: Sleep apnea No date: Tachycardia No date: TMJ (dislocation of temporomandibular joint) Past Surgical History: 11/01/2022: BIOPSY     Comment:  Procedure:  BIOPSY;  Surgeon: Jinny Carmine, MD;                Location: [facility] ENDOSCOPY;  Service: Endoscopy;; 2016: BREAST BIOPSY; Right     Comment:  neg 08/02/2014: BREAST LUMPECTOMY WITH RADIOACTIVE SEED LOCALIZATION; Right     Comment:  Procedure: BREAST LUMPECTOMY WITH RADIOACTIVE SEED               LOCALIZATION;  Surgeon: Donnice Bury, MD;  Location:              [facility] OR;  Service: General;  Laterality: Right; 08/12/2012: COLONOSCOPY     Comment:  Completed by Dr. Luba, normal exam.  11/01/2022: COLONOSCOPY WITH PROPOFOL ; N/A     Comment:  Procedure: COLONOSCOPY WITH PROPOFOL ;  Surgeon: Jinny Carmine, MD;  Location: [facility] ENDOSCOPY;  Service:               Endoscopy;  Laterality: N/A; 11/14/2023: CYSTOSCOPY W/ URETERAL STENT PLACEMENT; Left     Comment:  Procedure: CYSTOSCOPY, WITH RETROGRADE PYELOGRAM AND               URETERAL STENT INSERTION;  Surgeon: Georganne Penne SAUNDERS,               MD;  Location: [facility] ORS;  Service: Urology;  Laterality:  Left; 01/2004: ENDOMETRIAL BIOPSY No date: ESOPHAGOGASTRODUODENOSCOPY 08/18/2019: ESOPHAGOGASTRODUODENOSCOPY (EGD) WITH PROPOFOL ; N/A     Comment:  Procedure: ESOPHAGOGASTRODUODENOSCOPY (EGD) WITH               PROPOFOL ;  Surgeon: Jinny Carmine, MD;  Location: [facility]               ENDOSCOPY;  Service: Endoscopy;  Laterality: N/A; 11/01/2022: ESOPHAGOGASTRODUODENOSCOPY (EGD) WITH PROPOFOL ; N/A     Comment:  Procedure: ESOPHAGOGASTRODUODENOSCOPY (EGD) WITH               PROPOFOL ;  Surgeon: Jinny Carmine, MD;  Location: [facility]               ENDOSCOPY;  Service: Endoscopy;  Laterality: N/A; No date: FOOT SURGERY No date: SEPTOPLASTY     Comment:  two times No date: TONSILLECTOMY 06/2005: UTERINE FIBROID SURGERY     Comment:  ablation 09/2001: uterine tumor No date: VAGUS NERVE STIMULATOR INSERTION   Reproductive/Obstetrics negative OB ROS                              Anesthesia Physical Anesthesia  Plan  ASA: 3  Anesthesia Plan: General   Post-op Pain Management:    Induction:   PONV Risk Score and Plan: 4 or greater  Airway Management Planned:   Additional Equipment:   Intra-op Plan:   Post-operative Plan:   Informed Consent: I have reviewed the patients History and Physical, chart, labs and discussed the procedure including the risks, benefits and alternatives for the proposed anesthesia with the patient or authorized representative who has indicated his/her understanding and acceptance.     Dental Advisory Given  Plan Discussed with: CRNA  Anesthesia Plan Comments:         Anesthesia Quick Evaluation

## 2023-12-03 NOTE — Anesthesia Procedure Notes (Signed)
 Procedure Name: Intubation Date/Time: 12/03/2023 12:42 PM  Performed by: Bonnetta Jimmey SAUNDERS, CRNAPre-anesthesia Checklist: Patient identified, Emergency Drugs available, Suction available and Patient being monitored Patient Re-evaluated:Patient Re-evaluated prior to induction Oxygen Delivery Method: Circle system utilized Preoxygenation: Pre-oxygenation with 100% oxygen Induction Type: IV induction Ventilation: Mask ventilation without difficulty Laryngoscope Size: McGrath Grade View: Grade I Tube type: Oral Tube size: 7.0 mm Number of attempts: 1 Airway Equipment and Method: Stylet and Oral airway Placement Confirmation: ETT inserted through vocal cords under direct vision, positive ETCO2 and breath sounds checked- equal and bilateral Secured at: 21 cm Tube secured with: Tape Dental Injury: Teeth and Oropharynx as per pre-operative assessment

## 2023-12-03 NOTE — Transfer of Care (Signed)
 Immediate Anesthesia Transfer of Care Note  Patient: Mackenzie Bonilla  Procedure(s) Performed: CYSTOSCOPY/URETEROSCOPY/HOLMIUM LASER/STENT EXCHANGE (Left: Ureter)  Patient Location: PACU  Anesthesia Type:General  Level of Consciousness: awake  Airway & Oxygen Therapy: Patient Spontanous Breathing  Post-op Assessment: Report given to RN and Post -op Vital signs reviewed and stable  Post vital signs: Reviewed and stable  Last Vitals:  Vitals Value Taken Time  BP 92/50 12/03/23 14:08  Temp    Pulse 61 12/03/23 14:10  Resp 10 12/03/23 14:10  SpO2 96 % 12/03/23 14:10  Vitals shown include unfiled device data.  Last Pain:  Vitals:   12/03/23 1016  TempSrc: Oral  PainSc: 0-No pain         Complications: There were no known notable events for this encounter.

## 2023-12-03 NOTE — Op Note (Signed)
 Preoperative diagnosis:  Left nephrolithiasis   Postoperative diagnosis:  Left nephrolithiasis   Procedure:  Cystoscopy left ureteroscopy with laser lithotripsy left retrograde pyelography with interpretation  left ureteral stent exchange  Surgeon: Penne Skye, MD  Anesthesia: General  Complications: None  Intraoperative findings: Large staghorn Left renal stone - ~80-90% treatment with laser lithotripsy, limited by visualization towards end of treatment Left 72F x 24 cm JJ ureteral stent without string placed  EBL: Minimal  Specimens:  Left renal pelvic stone  Indication: Mackenzie Bonilla is a 58 y.o. patient with  Large Left partial staghorn  S/p Left ureteral stent placement 11/14/23   After reviewing the management options for treatment, he elected to proceed with the above surgical procedure(s). We have discussed the potential benefits and risks of the procedure, side effects of the proposed treatment, the likelihood of the patient achieving the goals of the procedure, and any potential problems that might occur during the procedure or recuperation. Informed consent has been obtained.  Description of procedure:  The patient was taken to the operating room and general anesthesia was induced.  The patient was placed in the dorsal lithotomy position, prepped and draped in the usual sterile fashion, and preoperative antibiotics were administered. A preoperative time-out was performed.   Cystourethroscopy was performed.  The patient's urethra was examined and was normal. The bladder was then systematically examined in its entirety. There was no evidence for any bladder tumors, stones, or other mucosal pathology.  Bilateral ureteral orifices noted in orthotopic location.  Attention then turned to the left ureteral orifice where the preexisting stent was visualized emanating from the UO  stent was controlled with graspers and externalized. The stent was then wired under  fluoroscopic guidance. Next, the stent was removed and we passed an open ended catheter over our wire. Omnipaque  contrast was injected through the ureteral catheter and a retrograde pyelogram was performed with findings: normal left ureteral contours, large renal pelvic opacity, no hydronephrosis, simple calyceal system.   A sensor guidewire was then advanced up the left ureter into the renal pelvis under fluoroscopic guidance. Next, we elected to place a ureteral access sheath.  This was placed under direct fluoroscopic guidance. We transitioned to a flexible ureteroscope and a complete pyeloscopy was performed.  Stone burden was encountered at the renal pelvis consistent with preoperative imaging.  A 365 m holmium laser was used to fully treat the stone with laser lithotripsy. ~80-90% of her staghorn stone was fully treated in a dusting fashion. There was remaining small to medium size fragments which were basket extracted, although we were ultimately limited by visualization. Decision made to allow stent drainage and staged 2nd look procedure.   A pull-back URS and RPG was performed which did not show any ureteral defects or contrast extravasation.   We elected to place a temporary indwelling stent without a string. Our sensor guidewire was replaced and a 72F x 24 cm JJ stent , which was placed via fluoroscopic guidance. A spot KUB confirmed appropriate proximal and distal curl locations.   The bladder was then emptied and the procedure ended.  The patient appeared to tolerate the procedure well and without complications.  The patient was able to be awakened and transferred to the recovery unit in satisfactory condition.   Plan: - Will need a 2nd look Left URS/LL in ~3-4 weeks, RTC for UA submission ~7 days prior  Penne Skye, M.D.

## 2023-12-03 NOTE — Discharge Instructions (Addendum)
 DISCHARGE INSTRUCTIONS FOR KIDNEY STONE/URETERAL STENT   MEDICATIONS:  1.  Resume all your other meds from home - except do not take any extra narcotic pain meds that you may have at home.  2. Pyridium is to help with the burning/stinging when you urinate. 3. Tramadol  is for moderate/severe pain, otherwise taking upto 1000 mg every 6 hours of plainTylenol will help treat your pain.    ACTIVITY:  1. No strenuous activity x 1week  2. No driving while on narcotic pain medications  3. Drink plenty of water  4. Continue to walk at home - you can still get blood clots when you are at home, so keep active, but don't over do it.  5. May return to work/school tomorrow or when you feel ready   BATHING:  1. You can shower and we recommend daily showers  2. You have a string coming from your urethra: The stent string is attached to your ureteral stent. Do not pull on this.   SIGNS/SYMPTOMS TO CALL:  Please call us  if you have a fever greater than 101.5, uncontrolled nausea/vomiting, uncontrolled pain, dizziness, unable to urinate, bloody urine, chest pain, shortness of breath, leg swelling, leg pain, redness around wound, drainage from wound, or any other concerns or questions.   You can reach us  at [phone].   FOLLOW-UP:  1. You have an indwelling left ureteral stent without string 2. We will need to schedule a 2nd procedure in 3-4 weeks (same procedure) to complete your stone treatment.

## 2023-12-03 NOTE — Interval H&P Note (Signed)
 History and Physical Interval Note:  12/03/2023 12:18 PM  Mackenzie Bonilla  has presented today for surgery, with the diagnosis of Left Nephrolithiasis.  The various methods of treatment have been discussed with the patient and family. After consideration of risks, benefits and other options for treatment, the patient has consented to  Procedure(s): CYSTOSCOPY/URETEROSCOPY/HOLMIUM LASER/STENT EXCHANGE (Left) as a surgical intervention.  The patient's history has been reviewed, patient examined, no change in status, stable for surgery.  I have reviewed the patient's chart and labs.  Questions were answered to the patient's satisfaction.    UROLOGY H&P UPDATE  Cardiac: RRR Lungs: CTA bilaterally  Laterality: LEft Procedure: Left URS/LL, possible stent exchange    Penne JONELLE Skye, MD 12/03/2023   Penne JONELLE Skye

## 2023-12-04 ENCOUNTER — Telehealth: Payer: Self-pay

## 2023-12-04 ENCOUNTER — Encounter: Payer: Self-pay | Admitting: Urology

## 2023-12-04 NOTE — Telephone Encounter (Signed)
 Pt called the triage line stating she had surgery yesterday for stent placement. Pt wanted to know if she could take tylenol  instead of tramadol  for pain, pt was advise to take tramadol  as prescribe by Dr.Garren, pt preferred to take tylenol . Pt denied any pain but feels uncomfortable due to not having BM  Pt states she has not had a BM since 10/11, states she usually goes once a week, states she has tried colace and linzess , pt was advised to take miralax and drink water.   Pt asked if she needed to take pyrdium, pt was advise to take it to prevent/help with burning or stinging when she urinates.  Pt wanted to know if she could go back to her PT exercises, pt was informed to not do any strenuous activities for 1 week.

## 2023-12-05 ENCOUNTER — Other Ambulatory Visit: Payer: Self-pay

## 2023-12-05 ENCOUNTER — Other Ambulatory Visit: Payer: Self-pay | Admitting: Urology

## 2023-12-05 DIAGNOSIS — N200 Calculus of kidney: Secondary | ICD-10-CM

## 2023-12-05 NOTE — Progress Notes (Signed)
 Placing OR orders for 2nd stage Left URS/LL, Left ureteral stent exchange - ~in 2-3 weeks

## 2023-12-05 NOTE — Progress Notes (Unsigned)
 Surgical Physician Order Form [facility]  Dr. Penne Skye, MD  * Scheduling expectation : ~3 weeks  *Length of Case: 60 min  *Clearance needed: no  *Anticoagulation Instructions: N/A  *Aspirin Instructions: N/A  *Post-op visit Date/Instructions:  1 month with RUS prior  *Diagnosis: Left Nephrolithiasis  *Procedure: left  Ureteroscopy w/laser lithotripsy & stent exchange ([id number])   Additional orders: N/A  -Admit type: OUTpatient  -Anesthesia: General  -VTE Prophylaxis Standing Order SCD's       Other:   -Standing Lab Orders Per Anesthesia    Lab other: UA&Urine Culture  -Standing Test orders EKG/Chest x-ray per Anesthesia       Test other:   - Medications:  Ancef 2gm IV  -Other orders:  N/A

## 2023-12-06 ENCOUNTER — Telehealth: Payer: Self-pay

## 2023-12-06 NOTE — Progress Notes (Signed)
   [facility] Surgical Posting Form  Surgery Date: Date: 12/17/2023  Surgeon: Dr. Penne Skye, MD  Inpt ( No  )   Outpt (Yes)   Obs ( No  )   Diagnosis: N20.0 Left Nephrolithiasis  -CPT: [phone]  Surgery: Left Ureteroscopy with Laser Lithotripsy and Stent Exchange  Stop Anticoagulations: No  Cardiac/Medical/Pulmonary Clearance needed: no  *Orders entered into EPIC  Date: 12/06/23   *Case booked in [place]  Date: 12/06/23  *Notified pt of Surgery: Date: 12/06/23  PRE-OP UA & CX: yes, will obtain in clinic on 12/09/2023  *Placed into Prior Authorization Work [place] Date: 12/06/23  Assistant/laser/rep:No

## 2023-12-06 NOTE — Telephone Encounter (Signed)
 Per Dr. Georganne, Patient is to be scheduled for Left Ureteroscopy with Laser Lithotripsy and Stent Exchange   Ms. Harbin was contacted and possible surgical dates were discussed, Tuesday November 4th, 2025 was agreed upon for surgery.   Patient was instructed that Dr. Georganne will require them to provide a pre-op UA & CX prior to surgery. This was ordered and scheduled drop off appointment was made for 12/09/2023.    Patient was directed to call [phone] between 1-3pm the day before surgery to find out surgical arrival time.  Instructions were given not to eat or drink from midnight on the night before surgery and have a driver for the day of surgery. On the surgery day patient was instructed to enter through the [facility] entrance of [facility] report the Same Day Surgery desk.   Pre-Admit Testing will be in contact via phone to set up an interview with the anesthesia team to review your history and medications prior to surgery.   Reminder of this information was sent via MyChart to the patient.

## 2023-12-09 ENCOUNTER — Other Ambulatory Visit

## 2023-12-09 DIAGNOSIS — N200 Calculus of kidney: Secondary | ICD-10-CM

## 2023-12-09 LAB — MICROSCOPIC EXAMINATION

## 2023-12-09 LAB — URINALYSIS, COMPLETE
Ketones, UA: NEGATIVE
Nitrite, UA: POSITIVE — AB
Specific Gravity, UA: 1.025 (ref 1.005–1.030)
Urobilinogen, Ur: 2 mg/dL — ABNORMAL HIGH (ref 0.2–1.0)
pH, UA: 6 (ref 5.0–7.5)

## 2023-12-11 ENCOUNTER — Ambulatory Visit: Payer: Self-pay

## 2023-12-11 NOTE — Telephone Encounter (Signed)
 FYI Only or Action Required?: Action required by provider: clinical question for provider.  Patient was last seen in primary care on 11/22/2023 by Randeen Laine LABOR, MD.  Called Nurse Triage reporting Diarrhea.  Symptoms began several days ago.  Interventions attempted: Prescription medications: Linzess  (she reports she alternates between and and Rest, hydration, or home remedies.  Symptoms are: mild loose stool/diarrhea (3 episodes in last 24 hours), abdominal bloating (yesterday)stable.  Triage Disposition: Call PCP Now (overriding Home Care)  Patient/caregiver understands and will follow disposition?: Yes             Copied from CRM [id number]. Topic: Clinical - Red Word Triage >> Dec 11, 2023  2:15 PM Harlene ORN wrote: Red Word that prompted transfer to Nurse Triage: Patient has an upcoming surgery next week and had to stop taking her mounjaro until after her surgery. Is having diarrhea, and is afraid to leave the house because her episodes happen at random. Reason for Disposition  MILD-MODERATE diarrhea (e.g., 1-6 times / day more than normal)  Answer Assessment - Initial Assessment Questions Patient would like to know if Dr Randeen would recommend that she hold her Linzess  due to diarrhea. She is currently holding her Mounjaro, last dose Friday 12/06/23 and will be resumed on 12/20/23. Offered office visit, patient declined and requesting message sent to PCP first.  1. DIARRHEA SEVERITY: How bad is the diarrhea? How many more stools have you had in the past 24 hours than normal?      1 episode last night and 1-2 episodes today. Her baseline on Linzess  and Mounjaro is once weekly.  2. ONSET: When did the diarrhea begin?      Since Friday, and worsening.  3. STOOL DESCRIPTION:  How loose or watery is the diarrhea? What is the stool color? Is there any blood or mucous in the stool?     Loose, brown color. She states she has to strain or push to get the  BM out. Denies blood or mucous in stool.  4. VOMITING: Are you also vomiting? If Yes, ask: How many times in the past 24 hours?      No.  5. ABDOMEN PAIN: Are you having any abdomen pain? If Yes, ask: What does it feel like? (e.g., crampy, dull, intermittent, constant)      Comes and goes, she is unsure if that pain is from the kidney stone. She describes like a menstrual cramp. Last time she has felt the pain was last night.  6. ABDOMEN PAIN SEVERITY: If present, ask: How bad is the pain?  (e.g., Scale 1-10; mild, moderate, or severe)     No pain at present.  7. ORAL INTAKE: If vomiting, Have you been able to drink liquids? How much liquids have you had in the past 24 hours?     N/A.  8. HYDRATION: Any signs of dehydration? (e.g., dry mouth [not just dry lips], too weak to stand, dizziness, new weight loss) When did you last urinate?     Last urinated a couple times this morning. She states she has drank 4 cups of water/2 bottles of 16 oz.  9. EXPOSURE: Have you traveled to a foreign country recently? Have you been exposed to anyone with diarrhea? Could you have eaten any food that was spoiled?     No.  10. ANTIBIOTIC USE: Are you taking antibiotics now or have you taken antibiotics in the past 2 months?       She states she  was on antibiotics for a month.  11. OTHER SYMPTOMS: Do you have any other symptoms? (e.g., fever, blood in stool)      Abdominal bloating (yesterday). Denies fever.  Protocols used: [facility]

## 2023-12-11 NOTE — Telephone Encounter (Signed)
 Pt notified of Dr. Graham comments

## 2023-12-11 NOTE — Telephone Encounter (Signed)
 Hold linzess  while not on mounjaro  Thanks  Update if not helpful

## 2023-12-12 ENCOUNTER — Other Ambulatory Visit: Payer: Self-pay | Admitting: Urology

## 2023-12-12 LAB — CULTURE, URINE COMPREHENSIVE

## 2023-12-13 LAB — STONE ANALYSIS
Calcium Oxalate Monohydrate: 40 %
Uric Acid Calculi: 60 %
Weight Calculi: 12 mg

## 2023-12-16 MED ORDER — CEFAZOLIN SODIUM-DEXTROSE 2-4 GM/100ML-% IV SOLN
2.0000 g | INTRAVENOUS | Status: AC
  Administered 2023-12-17: 2 g via INTRAVENOUS

## 2023-12-17 ENCOUNTER — Other Ambulatory Visit: Payer: Self-pay

## 2023-12-17 ENCOUNTER — Ambulatory Visit: Payer: Self-pay

## 2023-12-17 ENCOUNTER — Encounter
Admission: RE | Disposition: A | Discharge status: Home / Self Care | Payer: Self-pay | Source: Home / Self Care | Attending: Urology

## 2023-12-17 ENCOUNTER — Ambulatory Visit
Admission: RE | Admit: 2023-12-17 | Discharge: 2023-12-17 | Disposition: A | Discharge status: Home / Self Care | Attending: Urology | Admitting: Urology

## 2023-12-17 ENCOUNTER — Ambulatory Visit

## 2023-12-17 ENCOUNTER — Encounter: Payer: Self-pay | Admitting: Urology

## 2023-12-17 DIAGNOSIS — Z87440 Personal history of urinary (tract) infections: Secondary | ICD-10-CM | POA: Insufficient documentation

## 2023-12-17 DIAGNOSIS — F40240 Claustrophobia: Secondary | ICD-10-CM | POA: Diagnosis not present

## 2023-12-17 DIAGNOSIS — Z6836 Body mass index (BMI) 36.0-36.9, adult: Secondary | ICD-10-CM | POA: Insufficient documentation

## 2023-12-17 DIAGNOSIS — Z1611 Resistance to penicillins: Secondary | ICD-10-CM | POA: Insufficient documentation

## 2023-12-17 DIAGNOSIS — Z419 Encounter for procedure for purposes other than remedying health state, unspecified: Secondary | ICD-10-CM

## 2023-12-17 DIAGNOSIS — G4730 Sleep apnea, unspecified: Secondary | ICD-10-CM | POA: Diagnosis not present

## 2023-12-17 DIAGNOSIS — E282 Polycystic ovarian syndrome: Secondary | ICD-10-CM | POA: Diagnosis not present

## 2023-12-17 DIAGNOSIS — M797 Fibromyalgia: Secondary | ICD-10-CM | POA: Insufficient documentation

## 2023-12-17 DIAGNOSIS — N200 Calculus of kidney: Secondary | ICD-10-CM | POA: Insufficient documentation

## 2023-12-17 HISTORY — PX: CYSTOSCOPY/URETEROSCOPY/HOLMIUM LASER/STENT PLACEMENT: SHX6546

## 2023-12-17 LAB — GLUCOSE, CAPILLARY
Glucose-Capillary: 81 mg/dL (ref 70–99)
Glucose-Capillary: 138 mg/dL — ABNORMAL HIGH (ref 70–99)

## 2023-12-17 SURGERY — CYSTOSCOPY/URETEROSCOPY/HOLMIUM LASER/STENT PLACEMENT
Anesthesia: General | Site: Ureter | Laterality: Left

## 2023-12-17 MED ORDER — DEXTROSE 50 % IV SOLN
INTRAVENOUS | Status: AC
  Filled 2023-12-17: qty 50

## 2023-12-17 MED ORDER — TAMSULOSIN HCL 0.4 MG PO CAPS
0.4000 mg | ORAL_CAPSULE | Freq: Every day | ORAL | 0 refills | Status: AC
Start: 2023-12-17 — End: 2023-12-24

## 2023-12-17 MED ORDER — EPHEDRINE SULFATE-NACL 50-0.9 MG/10ML-% IV SOSY
PREFILLED_SYRINGE | INTRAVENOUS | Status: DC | PRN
  Administered 2023-12-17 (×2): 5 mg via INTRAVENOUS

## 2023-12-17 MED ORDER — CHLORHEXIDINE GLUCONATE 0.12 % MT SOLN
OROMUCOSAL | Status: AC
  Filled 2023-12-17: qty 15

## 2023-12-17 MED ORDER — PHENAZOPYRIDINE HCL 200 MG PO TABS: 200.0000 mg | ORAL_TABLET | Freq: Three times a day (TID) | ORAL | 0 refills | Status: AC | PRN

## 2023-12-17 MED ORDER — FENTANYL CITRATE (PF) 100 MCG/2ML IJ SOLN
INTRAMUSCULAR | Status: AC
  Filled 2023-12-17: qty 2

## 2023-12-17 MED ORDER — GLYCOPYRROLATE 0.2 MG/ML IJ SOLN
INTRAMUSCULAR | Status: DC | PRN
  Administered 2023-12-17: .2 mg via INTRAVENOUS

## 2023-12-17 MED ORDER — SODIUM CHLORIDE 0.9 % IR SOLN
Status: DC | PRN
  Administered 2023-12-17: 3000 mL

## 2023-12-17 MED ORDER — SUGAMMADEX SODIUM 200 MG/2ML IV SOLN
INTRAVENOUS | Status: DC | PRN
  Administered 2023-12-17: 200 mg via INTRAVENOUS

## 2023-12-17 MED ORDER — FENTANYL CITRATE (PF) 100 MCG/2ML IJ SOLN: 25.0000 ug | INTRAMUSCULAR | Status: DC | PRN

## 2023-12-17 MED ORDER — IOHEXOL 180 MG/ML  SOLN
INTRAMUSCULAR | Status: DC | PRN
  Administered 2023-12-17: 10 mL

## 2023-12-17 MED ORDER — FENTANYL CITRATE (PF) 100 MCG/2ML IJ SOLN: INTRAMUSCULAR | Status: DC | PRN

## 2023-12-17 MED ORDER — SUCCINYLCHOLINE CHLORIDE 200 MG/10ML IV SOSY
PREFILLED_SYRINGE | INTRAVENOUS | Status: DC | PRN
  Administered 2023-12-17: 100 mg via INTRAVENOUS

## 2023-12-17 MED ORDER — DEXMEDETOMIDINE HCL IN NACL 200 MCG/50ML IV SOLN
INTRAVENOUS | Status: DC | PRN
Start: 2023-12-17 — End: 2023-12-17
  Administered 2023-12-17: 8 ug via INTRAVENOUS

## 2023-12-17 MED ORDER — ACETAMINOPHEN 10 MG/ML IV SOLN
INTRAVENOUS | Status: DC | PRN
  Administered 2023-12-17: 1000 mg via INTRAVENOUS

## 2023-12-17 MED ORDER — STERILE WATER FOR IRRIGATION IR SOLN
Status: DC | PRN
  Administered 2023-12-17: 500 mL

## 2023-12-17 MED ORDER — ORAL CARE MOUTH RINSE: 15.0000 mL | Freq: Once | OROMUCOSAL | Status: AC

## 2023-12-17 MED ORDER — MIDAZOLAM HCL 2 MG/2ML IJ SOLN
INTRAMUSCULAR | Status: AC
  Filled 2023-12-17: qty 2

## 2023-12-17 MED ORDER — PROPOFOL 10 MG/ML IV BOLUS
INTRAVENOUS | Status: DC | PRN
  Administered 2023-12-17: 150 mg via INTRAVENOUS

## 2023-12-17 MED ORDER — VASOPRESSIN 20 UNIT/ML IV SOLN
INTRAVENOUS | Status: DC | PRN
Start: 2023-12-17 — End: 2023-12-17
  Administered 2023-12-17: 2 [IU] via INTRAVENOUS

## 2023-12-17 MED ORDER — OXYCODONE HCL 5 MG/5ML PO SOLN: 5.0000 mg | Freq: Once | ORAL | Status: DC | PRN

## 2023-12-17 MED ORDER — SODIUM CHLORIDE 0.9 % IV SOLN: INTRAVENOUS | Status: DC

## 2023-12-17 MED ORDER — OXYCODONE HCL 5 MG PO TABS: 5.0000 mg | ORAL_TABLET | Freq: Once | ORAL | Status: DC | PRN

## 2023-12-17 MED ORDER — ROCURONIUM BROMIDE 100 MG/10ML IV SOLN
INTRAVENOUS | Status: DC | PRN
  Administered 2023-12-17: 50 mg via INTRAVENOUS
  Administered 2023-12-17: 10 mg via INTRAVENOUS

## 2023-12-17 MED ORDER — DROPERIDOL 2.5 MG/ML IJ SOLN: 0.6250 mg | Freq: Once | INTRAMUSCULAR | Status: DC | PRN

## 2023-12-17 MED ORDER — LIDOCAINE HCL (CARDIAC) PF 100 MG/5ML IV SOSY
PREFILLED_SYRINGE | INTRAVENOUS | Status: DC | PRN
  Administered 2023-12-17: 100 mg via INTRAVENOUS

## 2023-12-17 MED ORDER — ACETAMINOPHEN 10 MG/ML IV SOLN
INTRAVENOUS | Status: AC
  Filled 2023-12-17: qty 100

## 2023-12-17 MED ORDER — KETOROLAC TROMETHAMINE 30 MG/ML IJ SOLN
INTRAMUSCULAR | Status: DC | PRN
  Administered 2023-12-17: 30 mg via INTRAVENOUS

## 2023-12-17 MED ORDER — ONDANSETRON HCL 4 MG/2ML IJ SOLN
INTRAMUSCULAR | Status: DC | PRN
  Administered 2023-12-17 (×2): 4 mg via INTRAVENOUS

## 2023-12-17 MED ORDER — PHENYLEPHRINE HCL-NACL 20-0.9 MG/250ML-% IV SOLN
INTRAVENOUS | Status: DC | PRN
  Administered 2023-12-17: 25 ug/min via INTRAVENOUS

## 2023-12-17 MED ORDER — CEFAZOLIN SODIUM-DEXTROSE 2-4 GM/100ML-% IV SOLN
INTRAVENOUS | Status: AC
  Filled 2023-12-17: qty 100

## 2023-12-17 MED ORDER — PROPOFOL 1000 MG/100ML IV EMUL
INTRAVENOUS | Status: AC
  Filled 2023-12-17: qty 100

## 2023-12-17 MED ORDER — DEXAMETHASONE SOD PHOSPHATE PF 10 MG/ML IJ SOLN
INTRAMUSCULAR | Status: DC | PRN
  Administered 2023-12-17: 10 mg via INTRAVENOUS

## 2023-12-17 MED ORDER — DEXTROSE 50 % IV SOLN
12.5000 g | Freq: Once | INTRAVENOUS | Status: AC
  Administered 2023-12-17: 12.5 g via INTRAVENOUS

## 2023-12-17 MED ORDER — PHENYLEPHRINE 80 MCG/ML (10ML) SYRINGE FOR IV PUSH (FOR BLOOD PRESSURE SUPPORT)
PREFILLED_SYRINGE | INTRAVENOUS | Status: DC | PRN
  Administered 2023-12-17 (×2): 160 ug via INTRAVENOUS
  Administered 2023-12-17: 80 ug via INTRAVENOUS

## 2023-12-17 MED ORDER — ACETAMINOPHEN 10 MG/ML IV SOLN: 1000.0000 mg | Freq: Once | INTRAVENOUS | Status: DC | PRN

## 2023-12-17 MED ORDER — CHLORHEXIDINE GLUCONATE 0.12 % MT SOLN
15.0000 mL | Freq: Once | OROMUCOSAL | Status: AC
  Administered 2023-12-17: 15 mL via OROMUCOSAL

## 2023-12-17 SURGICAL SUPPLY — 25 items
ADHESIVE MASTISOL STRL (MISCELLANEOUS) IMPLANT
BAG DRAIN SIEMENS DORNER NS (MISCELLANEOUS) ×1 IMPLANT
BAG PRESSURE INF REUSE 3000 (BAG) ×1 IMPLANT
BASKET ZERO TIP 1.9FR (BASKET) IMPLANT
BRUSH SCRUB EZ 4% CHG (MISCELLANEOUS) ×1 IMPLANT
CATH URET FLEX-TIP 2 LUMEN 10F (CATHETERS) IMPLANT
CATH URETL OPEN 5X70 (CATHETERS) IMPLANT
DRAPE UTILITY 15X26 TOWEL STRL (DRAPES) ×1 IMPLANT
DRSG TEGADERM 2-3/8X2-3/4 SM (GAUZE/BANDAGES/DRESSINGS) IMPLANT
FIBER LASER MOSES 200 DFL (Laser) IMPLANT
FIBER LASER MOSES 365 DFL (Laser) IMPLANT
GLOVE BIOGEL PI IND STRL 7.0 (GLOVE) ×1 IMPLANT
GOWN STRL REUS W/ TWL LRG LVL3 (GOWN DISPOSABLE) ×2 IMPLANT
GUIDEWIRE STR DUAL SENSOR (WIRE) ×1 IMPLANT
KIT TURNOVER CYSTO (KITS) ×1 IMPLANT
PACK CYSTO AR (MISCELLANEOUS) ×1 IMPLANT
SET CYSTO IRRIGATION (SET/KITS/TRAYS/PACK) ×1 IMPLANT
SHEATH NAVIGATOR HD 12/14X36 (SHEATH) IMPLANT
SOL .9 NS 3000ML IRR UROMATIC (IV SOLUTION) ×1 IMPLANT
STENT URET 6FRX24 CONTOUR (STENTS) IMPLANT
STENT URET 6FRX26 CONTOUR (STENTS) IMPLANT
SURGILUBE 2OZ TUBE FLIPTOP (MISCELLANEOUS) ×1 IMPLANT
SYR 10ML LL (SYRINGE) ×1 IMPLANT
VALVE UROSEAL ADJ ENDO (VALVE) IMPLANT
WATER STERILE IRR 500ML POUR (IV SOLUTION) ×1 IMPLANT

## 2023-12-17 NOTE — Op Note (Signed)
 Preoperative diagnosis:  Left nephrolithiasis   Postoperative diagnosis:  Left nephrolithiasis   Procedure:  Cystoscopy Left ureteroscopy with laser lithotripsy Left retrograde pyelography with interpretation  Left ureteral stent exchange  Surgeon: Penne Skye, MD  Anesthesia: General  Complications: None  Intraoperative findings: Brief dusting of residual stone burden, all small to medium sized fragments Basket extraction of all sizeable fragments-- very small grit remaining only  Temporary 59F x 24cm JJ ureteral stent with string placed  EBL: Minimal  Specimens: None  Indication: Mackenzie Bonilla is a 58 y.o. patient with: Large Left partial staghorn  S/p Left ureteral stent placement 11/14/23 S/p Left URS/LL on 12/03/23 (~80-90% treatment)  After reviewing the management options for treatment, he elected to proceed with the above surgical procedure(s). We have discussed the potential benefits and risks of the procedure, side effects of the proposed treatment, the likelihood of the patient achieving the goals of the procedure, and any potential problems that might occur during the procedure or recuperation. Informed consent has been obtained.  Description of procedure:  The patient was taken to the operating room and general anesthesia was induced.  The patient was placed in the dorsal lithotomy position, prepped and draped in the usual sterile fashion, and preoperative antibiotics were administered. A preoperative time-out was performed.   Cystourethroscopy was performed.  The patient's urethra was examined and was normal. The bladder was then systematically examined in its entirety. There was no evidence for any bladder tumors, stones, or other mucosal pathology.  Bilateral ureteral orifices noted in orthotopic location.  Attention then turned to the Left ureteral orifice where her prexisting ureteral stent was visualzing emanating from the left UO. The stent was  controlled with flexible graspers and externalized. A sensor wire was introduced through the stent into the renal pelvic and the stent was removed. Next, we introduced an open ended catheter and a retrograde pyelogram was obtained: normal left ureteral contours, no radiopacities or filling defects within the ureter. The wire was replaced, followed by a ureteral access sheath.  This was placed under direct fluoroscopic guidance. We transitioned to a flexible ureteroscope and a complete pyeloscopy was performed.  Small to medium residual stone burden was encountered within all three calyces.  A 365 m holmium laser was used to fully treat the stone with laser lithotripsy.   Next, a zero-tip wire basket was introduced and all sizeable stone fragments were extracted., A pull-back retrograde pyelogram and direct ureteroscopy was performed. This did not reveal any residual stone fragments, filling defects or contrast extravasation. , and Complete pyeloscopy was performed and no residual stone fragments were noted.  We elected to place a temporary indwelling stent with a string. Our sensor guidewire was replaced and a 59F x 24 cm JJ stent , which was placed via fluoroscopic guidance. A spot KUB confirmed appropriate proximal and distal curl locations.   The bladder was then emptied and the procedure ended.  The patient appeared to tolerate the procedure well and without complications.  The patient was able to be awakened and transferred to the recovery unit in satisfactory condition.   Plan: - follow up in clinic on Thu/Frid this week for nursing stent removal in clinic  - follow up in clinic in 4-6 weeks with PA, RBUS prior to visit  Penne Skye, M.D.

## 2023-12-17 NOTE — Anesthesia Procedure Notes (Signed)
 Procedure Name: Intubation Date/Time: 12/17/2023 7:37 AM  Performed by: Ledora Duncan, CRNAPre-anesthesia Checklist: Patient identified, Emergency Drugs available, Suction available and Patient being monitored Patient Re-evaluated:Patient Re-evaluated prior to induction Oxygen Delivery Method: Circle system utilized Preoxygenation: Pre-oxygenation with 100% oxygen Induction Type: IV induction Ventilation: Mask ventilation without difficulty Laryngoscope Size: McGrath and 3 Grade View: Grade I Tube type: Oral Tube size: 7.0 mm Number of attempts: 1 Airway Equipment and Method: Stylet Placement Confirmation: ETT inserted through vocal cords under direct vision, positive ETCO2 and breath sounds checked- equal and bilateral Secured at: 21 cm Tube secured with: Tape Dental Injury: Teeth and Oropharynx as per pre-operative assessment

## 2023-12-17 NOTE — Interval H&P Note (Signed)
 History and Physical Interval Note:  12/17/2023 7:10 AM  Mackenzie Bonilla  has presented today for surgery, with the diagnosis of Left Nephrolithiasis.  The various methods of treatment have been discussed with the patient and family. After consideration of risks, benefits and other options for treatment, the patient has consented to  Procedure(s) with comments: CYSTOSCOPY/URETEROSCOPY/HOLMIUM LASER/STENT PLACEMENT (Left) - EXCHANGE as a surgical intervention.  The patient's history has been reviewed, patient examined, no change in status, stable for surgery.  I have reviewed the patient's chart and labs.  Questions were answered to the patient's satisfaction.    Cardiac: RRR Lungs: CTA bilaterally  Laterality: Left Procedure: 2nd (Left URS/LL) with stent exchange Unchanged HPI from initial consult 10/9  Informed consent obtained, we specifically discussed the risks of bleeding, infection, post-operative pain, need for additional procedures. Operative site appropriately marked where indicated  Penne JONELLE Skye, MD 12/17/2023    Penne JONELLE Skye

## 2023-12-17 NOTE — Anesthesia Postprocedure Evaluation (Signed)
 Anesthesia Post Note  Patient: Mackenzie Bonilla  Procedure(s) Performed: CYSTOSCOPY/URETEROSCOPY/HOLMIUM LASER/STENT EXCHANGE (Left: Ureter)  Patient location during evaluation: PACU Anesthesia Type: General Level of consciousness: awake and alert Pain management: pain level controlled Vital Signs Assessment: post-procedure vital signs reviewed and stable Respiratory status: spontaneous breathing, nonlabored ventilation, respiratory function stable and patient connected to nasal cannula oxygen Cardiovascular status: blood pressure returned to baseline and stable Postop Assessment: no apparent nausea or vomiting Anesthetic complications: no   No notable events documented.   Last Vitals:  Vitals:   12/17/23 1000 12/17/23 1018  BP: 95/82 (!) 96/57  Pulse: 70 64  Resp: 16 18  Temp: (!) 36.3 C   SpO2: 100% 99%    Last Pain:  Vitals:   12/17/23 1000  TempSrc:   PainSc: 0-No pain                 Lynwood KANDICE Clause

## 2023-12-17 NOTE — Discharge Instructions (Signed)
 DISCHARGE INSTRUCTIONS FOR KIDNEY STONE/URETERAL STENT   MEDICATIONS:  1.  Resume all your other meds from home - except do not take any extra narcotic pain meds that you may have at home.  2. Pyridium is to help with the burning/stinging when you urinate.   ACTIVITY:  1. No strenuous activity x 1week  2. No driving while on narcotic pain medications  3. Drink plenty of water  4. Continue to walk at home - you can still get blood clots when you are at home, so keep active, but don't over do it.  5. May return to work/school tomorrow or when you feel ready   BATHING:  1. You can shower and we recommend daily showers  2. You have a string coming from your urethra: The stent string is attached to your ureteral stent. Do not pull on this.   SIGNS/SYMPTOMS TO CALL:  Please call us  if you have a fever greater than 101.5, uncontrolled nausea/vomiting, uncontrolled pain, dizziness, unable to urinate, bloody urine, chest pain, shortness of breath, leg swelling, leg pain, redness around wound, drainage from wound, or any other concerns or questions.   You can reach us  at [phone].   FOLLOW-UP:  1. You will have an appointment in 6 weeks with a ultrasound of your kidneys prior.   2. You have a string attached to your stent. You will be scheduled for a follow up in clinic this Thu or Fri for stent removal with our nurses. You may also pull the stent at home by gently pulling on the string.

## 2023-12-17 NOTE — Transfer of Care (Signed)
 Immediate Anesthesia Transfer of Care Note  Patient: Mackenzie Bonilla  Procedure(s) Performed: CYSTOSCOPY/URETEROSCOPY/HOLMIUM LASER/STENT EXCHANGE (Left: Ureter)  Patient Location: PACU  Anesthesia Type:General  Level of Consciousness: awake, drowsy, and patient cooperative  Airway & Oxygen Therapy: Patient Spontanous Breathing and Patient connected to face mask oxygen  Post-op Assessment: Report given to RN and Post -op Vital signs reviewed and stable  Post vital signs: Reviewed and stable  Last Vitals:  Vitals Value Taken Time  BP 126/63 12/17/23 08:45  Temp 36.2 C 12/17/23 08:44  Pulse 56 12/17/23 08:48  Resp 16 12/17/23 08:48  SpO2 100 % 12/17/23 08:48  Vitals shown include unfiled device data.  Last Pain:  Vitals:   12/17/23 0844  TempSrc:   PainSc: Asleep         Complications: No notable events documented.

## 2023-12-17 NOTE — Anesthesia Preprocedure Evaluation (Signed)
 Anesthesia Evaluation  Patient identified by MRN, date of birth, ID band Patient awake    Reviewed: Allergy & Precautions, H&P , NPO status , Patient's Chart, lab work & pertinent test results, reviewed documented beta blocker date and time   Airway Mallampati: II  TM Distance: >3 FB Neck ROM: full    Dental  (+) Teeth Intact   Pulmonary asthma , sleep apnea and Continuous Positive Airway Pressure Ventilation    Pulmonary exam normal        Cardiovascular Exercise Tolerance: Poor + Peripheral Vascular Disease  Normal cardiovascular exam Rhythm:regular Rate:Normal     Neuro/Psych   Anxiety Depression     Neuromuscular disease  negative psych ROS   GI/Hepatic Neg liver ROS,GERD  Medicated,,  Endo/Other  negative endocrine ROSdiabetes    Renal/GU Renal disease  negative genitourinary   Musculoskeletal   Abdominal   Peds  Hematology  (+) Blood dyscrasia, anemia   Anesthesia Other Findings Past Medical History: No date: Allergy     Comment:  allergic rhinitis No date: Anemia No date: Anxiety No date: Arthritis     Comment:  osteoarthritis No date: Asthma No date: Claustrophobia     Comment:  does not like oxygen mask covering face No date: Depression No date: Diabetes mellitus without complication (HCC) 07/2008: Fatty liver     Comment:  abd. ultrasound - fatty liver ; slt dilated cbd (no               stones ) 06/10// abd.  ultrasound normal on 04/2006 No date: Fibromyalgia No date: GERD (gastroesophageal reflux disease)     Comment:  EGD negative 06/2001// EGD erythematous mucosa, polyp               08/2008 No date: High uric acid in 24 hour urine specimen No date: Hyperhidrosis No date: Hyperlipidemia No date: Kidney stones No date: Lymphedema No date: Obesity No date: PCOS (polycystic ovarian syndrome) No date: Sleep apnea No date: Tachycardia No date: TMJ (dislocation of temporomandibular  joint) Past Surgical History: 11/01/2022: BIOPSY     Comment:  Procedure: BIOPSY;  Surgeon: Jinny Carmine, MD;                Location: [facility];  Service: Endoscopy;; 2016: BREAST BIOPSY; Right     Comment:  neg 08/02/2014: BREAST LUMPECTOMY WITH RADIOACTIVE SEED LOCALIZATION; Right     Comment:  Procedure: BREAST LUMPECTOMY WITH RADIOACTIVE SEED               LOCALIZATION;  Surgeon: Donnice Bury, MD;  Location:              [facility] OR;  Service: General;  Laterality: Right; 08/12/2012: COLONOSCOPY     Comment:  Completed by Dr. Luba, normal exam.  11/01/2022: COLONOSCOPY WITH PROPOFOL ; N/A     Comment:  Procedure: COLONOSCOPY WITH PROPOFOL ;  Surgeon: Jinny Carmine, MD;  Location: [facility];  Service:               Endoscopy;  Laterality: N/A; 11/14/2023: CYSTOSCOPY W/ URETERAL STENT PLACEMENT; Left     Comment:  Procedure: CYSTOSCOPY, WITH RETROGRADE PYELOGRAM AND               URETERAL STENT INSERTION;  Surgeon: Georganne Penne SAUNDERS,               MD;  Location: [facility] ORS;  Service: Urology;  Laterality:               Left; 12/03/2023: CYSTOSCOPY/URETEROSCOPY/HOLMIUM LASER/STENT PLACEMENT;  Left     Comment:  Procedure: CYSTOSCOPY/URETEROSCOPY/HOLMIUM LASER/STENT               EXCHANGE;  Surgeon: Georganne Penne SAUNDERS, MD;  Location:               [facility] ORS;  Service: Urology;  Laterality: Left; 01/2004: ENDOMETRIAL BIOPSY No date: ESOPHAGOGASTRODUODENOSCOPY 08/18/2019: ESOPHAGOGASTRODUODENOSCOPY (EGD) WITH PROPOFOL ; N/A     Comment:  Procedure: ESOPHAGOGASTRODUODENOSCOPY (EGD) WITH               PROPOFOL ;  Surgeon: Jinny Carmine, MD;  Location: [facility]               ENDOSCOPY;  Service: Endoscopy;  Laterality: N/A; 11/01/2022: ESOPHAGOGASTRODUODENOSCOPY (EGD) WITH PROPOFOL ; N/A     Comment:  Procedure: ESOPHAGOGASTRODUODENOSCOPY (EGD) WITH               PROPOFOL ;  Surgeon: Jinny Carmine, MD;  Location: [facility]               ENDOSCOPY;  Service: Endoscopy;  Laterality:  N/A; No date: FOOT SURGERY No date: SEPTOPLASTY     Comment:  two times No date: TONSILLECTOMY 06/2005: UTERINE FIBROID SURGERY     Comment:  ablation 09/2001: uterine tumor No date: VAGUS NERVE STIMULATOR INSERTION BMI    Body Mass Index: 36.90 kg/m     Reproductive/Obstetrics negative OB ROS                              Anesthesia Physical Anesthesia Plan  ASA: 3  Anesthesia Plan: General ETT   Post-op Pain Management:    Induction:   PONV Risk Score and Plan:   Airway Management Planned:   Additional Equipment:   Intra-op Plan:   Post-operative Plan:   Informed Consent: I have reviewed the patients History and Physical, chart, labs and discussed the procedure including the risks, benefits and alternatives for the proposed anesthesia with the patient or authorized representative who has indicated his/her understanding and acceptance.     Dental Advisory Given  Plan Discussed with: CRNA  Anesthesia Plan Comments:         Anesthesia Quick Evaluation

## 2023-12-18 ENCOUNTER — Encounter: Payer: Self-pay | Admitting: Urology

## 2023-12-18 ENCOUNTER — Other Ambulatory Visit: Payer: Self-pay

## 2023-12-18 DIAGNOSIS — N200 Calculus of kidney: Secondary | ICD-10-CM

## 2023-12-19 ENCOUNTER — Ambulatory Visit (INDEPENDENT_AMBULATORY_CARE_PROVIDER_SITE_OTHER): Admitting: Urology

## 2023-12-19 DIAGNOSIS — Z9889 Other specified postprocedural states: Secondary | ICD-10-CM

## 2023-12-19 NOTE — Progress Notes (Signed)
 Patient presented today for stent removal of Left ureteral stent. Patient was placed in lithotomy position and string was located and stent was removed. Patient tolerated well. Patient was instructed to stay on her Flomax  and pyridium at least the next 3 days to help with symptoms from removal. Patient voiced understanding. Patient has follow up with ultrasound and PA in 4-6 weeks.  Mathew pinal, RN

## 2023-12-23 ENCOUNTER — Other Ambulatory Visit: Payer: Self-pay | Admitting: Family Medicine

## 2023-12-24 ENCOUNTER — Encounter: Admitting: Dietician

## 2023-12-24 NOTE — Telephone Encounter (Signed)
 Last OV was a hospital f/u on 11/22/23   Last filled on 06/04/23 #90 caps/ 1 refill

## 2023-12-26 ENCOUNTER — Telehealth: Payer: Self-pay

## 2023-12-26 NOTE — Telephone Encounter (Signed)
 I called pt and notified her it is OK for pt to bring urine specimen with her. Pt said she has a sterile container. Sending note as FYI to McCrory CMA.,

## 2023-12-26 NOTE — Telephone Encounter (Signed)
 Copied from CRM #[id number]. Topic: Clinical - Request for Lab/Test Order >> Dec 26, 2023  3:43 PM Rea BROCKS wrote: Reason for CRM: Patient is scheduled for an appointment tomorrow at 2:20pm. And, she's wanting to bring a urine sample in vs coming into her appointment tomorrow and doing it then. I told her an order would need to be in the chart as well. Patient would rather bring one in because she said it would be easier for her.   Patients contact: [phone] (H)

## 2023-12-26 NOTE — Telephone Encounter (Signed)
 Do I need to call her to let her know it is okay to bring urine sample in with her tomorrow for her appointment?

## 2023-12-27 ENCOUNTER — Encounter: Payer: Self-pay | Admitting: Family Medicine

## 2023-12-27 ENCOUNTER — Ambulatory Visit: Admitting: Family Medicine

## 2023-12-27 VITALS — BP 90/60 | HR 76 | Temp 97.3°F | Ht 64.0 in | Wt 219.4 lb

## 2023-12-27 DIAGNOSIS — R8290 Unspecified abnormal findings in urine: Secondary | ICD-10-CM

## 2023-12-27 DIAGNOSIS — G244 Idiopathic orofacial dystonia: Secondary | ICD-10-CM | POA: Insufficient documentation

## 2023-12-27 LAB — POC URINALSYSI DIPSTICK (AUTOMATED)
Bilirubin, UA: NEGATIVE
Blood, UA: NEGATIVE
Glucose, UA: NEGATIVE
Ketones, UA: NEGATIVE
Nitrite, UA: POSITIVE
Protein, UA: NEGATIVE
Spec Grav, UA: 1.01 (ref 1.010–1.025)
Urobilinogen, UA: 0.2 U/dL
pH, UA: 6 (ref 5.0–8.0)

## 2023-12-27 MED ORDER — SULFAMETHOXAZOLE-TRIMETHOPRIM 800-160 MG PO TABS: 1.0000 | ORAL_TABLET | Freq: Two times a day (BID) | ORAL | 0 refills | Status: DC

## 2023-12-27 NOTE — Progress Notes (Signed)
 Patient ID: Mackenzie Bonilla, female    DOB: 02/07/1966, 58 y.o.   MRN: [id number]  This visit was conducted in person.  BP 90/60   Pulse 76   Temp (!) 97.3 F (36.3 C) (Temporal)   Ht 5' 4 (1.626 m)   Wt 219 lb 6 oz (99.5 kg)   SpO2 96%   BMI 37.66 kg/m    CC:  Chief Complaint  Patient presents with   Cloudy Urine    Resently had surgery for kidney stones    Subjective:   HPI: Mackenzie Bonilla is a 58 y.o. female presenting on 12/27/2023 for Cloudy Urine (Resently had surgery for kidney stones)   She has a history of kidney stones.... stent placement 10/21, lithotripsy x 2  Reviewed recent office visit from December 19, 2023 with Dr. Candise urology  She was seen at that time for removal of left ureteral stent.  Instructed to continue Flomax  and Pyridium for 3 days following.  Today she reports  increased urinary frequency since procedure, she has some incontinence. Some urgency.  No dysuria.  Now in last 2-3 days cloudy urine.  Looser BMs... has IBS.   No fever.  Some low back pain.Mackenzie Bonilla but was doing PT for this.   Urine culture:  Ecoli sensitive to cipro .  Relevant past medical, surgical, family and social history reviewed and updated as indicated. Interim medical history since our last visit reviewed. Allergies and medications reviewed and updated. Outpatient Medications Prior to Visit  Medication Sig Dispense Refill   ACCU-CHEK GUIDE TEST test strip SMARTSIG:Strip(s)     Accu-Chek Softclix Lancets lancets SMARTSIG:Lancet Topical     acetaminophen  (TYLENOL ) 500 MG tablet Take 500 mg by mouth every 6 (six) hours as needed (pain.).     albuterol  (VENTOLIN  HFA) 108 (90 Base) MCG/ACT inhaler INHALE 2 PUFFS INTO THE LUNGS EVERY 6 HOURS AS NEEDED FOR WHEEZING. 18 g 1   ALPRAZolam (XANAX) 1 MG tablet Take 2 mg by mouth See admin instructions. Take 2 tablets (2 mg) by mouth scheduled at bedtime, may take 1 tablet (1 mg) by mouth up to twice daily if needed for  anxiety.     ammonium lactate  (AMLACTIN) 12 % cream Apply 1 Application topically as needed for dry skin. 385 g 5   BD PEN NEEDLE NANO U/F 32G X 4 MM MISC daily.     Blood Glucose Monitoring Suppl (FIFTY50 GLUCOSE METER 2.0) w/Device KIT Use as directed     Continuous Glucose Sensor (FREESTYLE LIBRE 3 PLUS SENSOR) MISC      cyclobenzaprine  (FLEXERIL ) 5 MG tablet TAKE 1 TABLET BY MOUTH 3 TIMES DAILY AS NEEDED FOR MUSCLE SPASMS 90 tablet 0   desvenlafaxine (PRISTIQ) 50 MG 24 hr tablet Take 50 mg by mouth in the morning.     dimenhyDRINATE (DRAMAMINE) 50 MG tablet Take 50 mg by mouth every 8 (eight) hours as needed for nausea.     docusate sodium  (COLACE) 100 MG capsule TAKE ONE CAPSULE BY MOUTH TWICE DAILY. 180 capsule 1   famotidine  (PEPCID ) 20 MG tablet Take 1 tablet (20 mg total) by mouth 2 (two) times daily as needed for heartburn or indigestion (for acid reflux). (Patient taking differently: Take 20 mg by mouth in the morning and at bedtime.) 60 tablet 1   FLUoxetine (PROZAC) 20 MG capsule Take 20 mg by mouth in the morning.     fluticasone  (FLONASE ) 50 MCG/ACT nasal spray Place 1 spray into both nostrils daily  as needed for allergies.     gabapentin  (NEURONTIN ) 100 MG capsule TAKE 1 CAPSULE BY MOUTH ONCE DAILY WITH BREAKFAST 90 capsule 1   gabapentin  (NEURONTIN ) 300 MG capsule Take 1 capsule (300 mg total) by mouth at bedtime. 90 capsule 1   hydrocortisone  (ANUSOL -HC) 2.5 % rectal cream Apply to affected (hemorrhoid area) once daily at bedtime as needed for no more than 10 days in a row (Patient taking differently: Place 1 Application rectally at bedtime as needed for hemorrhoids.) 30 g 0   ibuprofen  (ADVIL ) 200 MG tablet Take 400 mg by mouth every 6 (six) hours as needed for headache or moderate pain (pain score 4-6).     linaclotide  (LINZESS ) 145 MCG CAPS capsule Take 1 capsule (145 mcg total) by mouth every other day. 45 capsule 3   linaclotide  (LINZESS ) 290 MCG CAPS capsule Take 1 capsule  (290 mcg total) by mouth every other day. 45 capsule 3   losartan (COZAAR) 25 MG tablet Take 25 mg by mouth in the morning.     minoxidil  (LONITEN ) 2.5 MG tablet Take 1 tablet (2.5 mg total) by mouth daily. 90 tablet 1   mometasone  (ELOCON ) 0.1 % cream Apply to affected area left thigh twice daily as needed for flares/itch. Avoid applying to face, groin, and axilla. Use as directed. Long-term use can cause thinning of the skin. 45 g 1   MOUNJARO 12.5 MG/0.5ML Pen Inject 12.5 mg into the skin every Sunday.     omeprazole  (PRILOSEC) 40 MG capsule Take 1 capsule (40 mg total) by mouth in the morning and at bedtime. 180 capsule 2   oxybutynin  (DITROPAN  XL) 15 MG 24 hr tablet Take 1 tablet (15 mg total) by mouth daily. 90 tablet 1   Polyethyl Glycol-Propyl Glycol (SYSTANE OP) Place 1-2 drops into both eyes 3 (three) times daily as needed (dry/irritated eyes.).     pravastatin  (PRAVACHOL ) 20 MG tablet Take 1 tablet (20 mg total) by mouth at bedtime. 90 tablet 0   Simethicone (GAS-X PO) Take 2 tablets by mouth as needed (gas).     spironolactone  (ALDACTONE ) 100 MG tablet TAKE 1 TABLET BY MOUTH DAILY 90 tablet 1   traMADol  (ULTRAM ) 50 MG tablet Take 1 tablet (50 mg total) by mouth every 12 (twelve) hours as needed for severe pain (pain score 7-10). Caution of sedation 30 tablet 0   traZODone (DESYREL) 150 MG tablet Take 300 mg by mouth at bedtime.     TRESIBA FLEXTOUCH 100 UNIT/ML FlexTouch Pen Inject 16 Units into the skin at bedtime.     zinc oxide (BALMEX) 11.3 % CREA cream Apply 1 application  topically 2 (two) times daily as needed (skin irritation/skin lesions.).     No facility-administered medications prior to visit.     Per HPI unless specifically indicated in ROS section below Review of Systems  Constitutional:  Negative for fatigue and fever.  HENT:  Negative for congestion.   Eyes:  Negative for pain.  Respiratory:  Negative for cough and shortness of breath.   Cardiovascular:  Negative  for chest pain, palpitations and leg swelling.  Gastrointestinal:  Negative for abdominal pain.  Genitourinary:  Negative for dysuria and vaginal bleeding.  Musculoskeletal:  Negative for back pain.  Neurological:  Negative for syncope, light-headedness and headaches.  Psychiatric/Behavioral:  Negative for dysphoric mood.    Objective:  BP 90/60   Pulse 76   Temp (!) 97.3 F (36.3 C) (Temporal)   Ht 5' 4 (1.626 m)  Wt 219 lb 6 oz (99.5 kg)   SpO2 96%   BMI 37.66 kg/m   Wt Readings from Last 3 Encounters:  12/27/23 219 lb 6 oz (99.5 kg)  12/17/23 215 lb (97.5 kg)  12/03/23 215 lb (97.5 kg)      Physical Exam Constitutional:      Appearance: She is obese.  Abdominal:     General: Bowel sounds are normal.     Palpations: Abdomen is soft.     Tenderness: There is no abdominal tenderness. There is no right CVA tenderness, left CVA tenderness, guarding or rebound.       Results for orders placed or performed in visit on 12/27/23  POCT Urinalysis Dipstick (Automated)   Collection Time: 12/27/23  2:23 PM  Result Value Ref Range   Color, UA Yellow    Clarity, UA Hazy    Glucose, UA Negative Negative   Bilirubin, UA Negative    Ketones, UA Negative    Spec Grav, UA 1.010 1.010 - 1.025   Blood, UA Negative    pH, UA 6.0 5.0 - 8.0   Protein, UA Negative Negative   Urobilinogen, UA 0.2 0.2 or 1.0 E.U./dL   Nitrite, UA Positive    Leukocytes, UA Large (3+) (A) Negative   *Note: Due to a large number of results and/or encounters for the requested time period, some results have not been displayed. A complete set of results can be found in Results Review.    Assessment and Plan  Cloudy urine -     POCT Urinalysis Dipstick (Automated) -     Urine Culture  Other orders -     Sulfamethoxazole -Trimethoprim ; Take 1 tablet by mouth 2 (two) times daily.  Dispense: 6 tablet; Refill: 0    No follow-ups on file.   Greig Ring, MD

## 2023-12-27 NOTE — Assessment & Plan Note (Signed)
 Acute, recent stent placement, lithotripsy and stent removal for nephrolithiasis. Recent treatment for E. coli in early October. High risk for urinary tract infection.  No current sign and symptom of pyelonephritis. Urinalysis concerning for UTI.  Will send urine for culture but will treat empirically with Bactrim  double strength 1 tablet twice daily x 3 days. Encouraged patient to push fluids.  Return and ER precautions provided.

## 2023-12-30 ENCOUNTER — Ambulatory Visit: Admitting: Dermatology

## 2023-12-30 VITALS — BP 101/66

## 2023-12-30 DIAGNOSIS — L650 Telogen effluvium: Secondary | ICD-10-CM

## 2023-12-30 DIAGNOSIS — Z7189 Other specified counseling: Secondary | ICD-10-CM

## 2023-12-30 DIAGNOSIS — S0081XA Abrasion of other part of head, initial encounter: Secondary | ICD-10-CM

## 2023-12-30 DIAGNOSIS — Z79899 Other long term (current) drug therapy: Secondary | ICD-10-CM | POA: Diagnosis not present

## 2023-12-30 DIAGNOSIS — L649 Androgenic alopecia, unspecified: Secondary | ICD-10-CM

## 2023-12-30 MED ORDER — MINOXIDIL 2.5 MG PO TABS: 2.5000 mg | ORAL_TABLET | Freq: Every day | ORAL | 1 refills | Status: AC

## 2023-12-30 MED ORDER — KETOCONAZOLE 2 % EX SHAM: MEDICATED_SHAMPOO | CUTANEOUS | 5 refills | Status: AC

## 2023-12-30 NOTE — Progress Notes (Signed)
 Follow-Up Visit   Subjective  Mackenzie Bonilla is a 58 y.o. female who presents for the following: 6 month follow-up Androgenetic Alopecia. Patient is taking minoxidil  2.5 mg and Spironolactone  100 mg.   Surgery in October and November for Kidney stones.   The following portions of the chart were reviewed this encounter and updated as appropriate: medications, allergies, medical history  Review of Systems:  No other skin or systemic complaints except as noted in HPI or Assessment and Plan.  Objective  Well appearing patient in no apparent distress; mood and affect are within normal limits.  A focused examination was performed of the following areas: Scalp  Relevant physical exam findings are noted in the Assessment and Plan.         Assessment & Plan  ANDROGENETIC ALOPECIA (FEMALE PATTERN HAIR LOSS) WITH ASSOC TELOGEN EFFLUVIUM FROM RECENT SURGERIES   Exam: Diffuse thinning of the crown and widening of the midline part with retention of the frontal hairline Photos compared 11/07/2021. About the same, possible slight widening of part  Chronic and persistent condition with duration or expected duration over one year. Condition is symptomatic/ bothersome to patient. Not currently at goal.  Telogen Effluvium Counseling Telogen effluvium is a benign, self-limited condition causing increased hair shedding usually for several months. It does not progress to baldness, and the hair eventually grows back on its own. It can be triggered by recent illness, recent surgery, thyroid  disease, low iron stores, vitamin D  deficiency, fad diets or rapid weight loss, hormonal changes such as pregnancy or birth control pills, and some medication. Usually the hair loss starts 2-3 months after the illness or health change. Rarely, it can continue for longer than a year. Treatments options may include oral or topical Minoxidil ; Red Light scalp treatments; Biotin 2.5 mg daily and other  options.   Female Androgenic Alopecia is a chronic condition related to genetics and/or hormonal changes.  In women androgenetic alopecia is commonly associated with menopause but may occur any time after puberty.  It causes hair thinning primarily on the crown with widening of the part and temporal hairline recession.  Can use OTC Rogaine  (minoxidil ) 5% solution/foam as directed.  Oral treatments in female patients who have no contraindication may include : - Low dose oral minoxidil  1.25 - 5mg  daily - Spironolactone  50 - 100mg  bid - Finasteride 2.5 - 5 mg daily Adjunctive therapies include: - Low Level Laser Light Therapy (LLLT) - Platelet-rich plasma injections (PRP) - Hair Transplants or scalp reduction   Treatment Plan: BP 101/66 Continue Minoxidil  2.5 MG 1 tablet daily. Continue Spironolactone  100 MG 1 tablet daily as prescribed by PCP. Start ketoconazole  2% shampoo - massage into scalp and let sit several minutes before rinsing    Doses of minoxidil  for hair loss are considered 'low dose'. This is because the doses used for hair loss are much lower than the doses which are used for conditions such as high blood pressure (hypertension). The doses used for hypertension are 10-40mg  per day.  Side effects are uncommon at the low doses (up to 2.5 mg/day) used to treat hair loss. Potential side effects, more commonly seen at higher doses, include: Increase in hair growth (hypertrichosis) elsewhere on face and body Temporary hair shedding upon starting medication which may last up to 4 weeks Ankle swelling, fluid retention, rapid weight gain more than 5 pounds Low blood pressure and feeling lightheaded or dizzy when standing up quickly Fast or irregular heartbeat Headaches   Spironolactone   can cause increased urination and cause blood pressure to decrease. Please watch for signs of lightheadedness and be cautious when changing position. It can sometimes cause breast tenderness or an  irregular period in premenopausal women. It can also increase potassium. The increase in potassium usually is not a concern unless you are taking other medicines that also increase potassium, so please be sure your doctor knows all of the other medications you are taking. This medication should not be taken by pregnant women.  This medicine should also not be taken together with sulfa  drugs like Bactrim  (trimethoprim /sulfamethexazole).    Long term medication management.  Patient is using long term (months to years) prescription medication  to control their dermatologic condition.  These medications require periodic monitoring to evaluate for efficacy and side effects and may require periodic laboratory monitoring.    EXCORIATION Exam: Excoriation at left lateral cheek  Treatment Plan: Recommend vaseline. Call if not resolving. Vaseline and Aquaphor samples given.   ANDROGENETIC ALOPECIA   Related Medications minoxidil  (LONITEN ) 2.5 MG tablet Take 1 tablet (2.5 mg total) by mouth daily.   Return in about 6 months (around 06/28/2024) for alopecia.  IAndrea Kerns, CMA, am acting as scribe for Rexene Rattler, MD .   Documentation: I have reviewed the above documentation for accuracy and completeness, and I agree with the above.  Rexene Rattler, MD

## 2023-12-30 NOTE — Patient Instructions (Addendum)
 Telogen Effluvium Counseling Telogen effluvium is a benign, self-limited condition causing increased hair shedding usually for several months. It does not progress to baldness, and the hair eventually grows back on its own. It can be triggered by recent illness, recent surgery, thyroid  disease, low iron stores, vitamin D  deficiency, fad diets or rapid weight loss, hormonal changes such as pregnancy or birth control pills, and some medication. Usually the hair loss starts 2-3 months after the illness or health change. Rarely, it can continue for longer than a year. Treatments options may include oral or topical Minoxidil ; Red Light scalp treatments; Biotin 2.5 mg daily and other options.    Due to recent changes in healthcare laws, you may see results of your pathology and/or laboratory studies on MyChart before the doctors have had a chance to review them. We understand that in some cases there may be results that are confusing or concerning to you. Please understand that not all results are received at the same time and often the doctors may need to interpret multiple results in order to provide you with the best plan of care or course of treatment. Therefore, we ask that you please give us  2 business days to thoroughly review all your results before contacting the office for clarification. Should we see a critical lab result, you will be contacted sooner.   If You Need Anything After Your Visit  If you have any questions or concerns for your doctor, please call our main line at [phone] and press option 4 to reach your doctor's medical assistant. If no one answers, please leave a voicemail as directed and we will return your call as soon as possible. Messages left after 4 pm will be answered the following business day.   You may also send us  a message via MyChart. We typically respond to MyChart messages within 1-2 business days.  For prescription refills, please ask your pharmacy to contact our  office. Our fax number is [phone].  If you have an urgent issue when the clinic is closed that cannot wait until the next business day, you can page your doctor at the number below.    Please note that while we do our best to be available for urgent issues outside of office hours, we are not available 24/7.   If you have an urgent issue and are unable to reach us , you may choose to seek medical care at your doctor's office, retail clinic, urgent care center, or emergency room.  If you have a medical emergency, please immediately call 911 or go to the emergency department.  Pager Numbers  - Dr. Hester: [phone]  - Dr. Jackquline: [phone]  - Dr. Claudene: [phone]   - Dr. Raymund: [phone]  In the event of inclement weather, please call our main line at [phone] for an update on the status of any delays or closures.  Dermatology Medication Tips: Please keep the boxes that topical medications come in in order to help keep track of the instructions about where and how to use these. Pharmacies typically print the medication instructions only on the boxes and not directly on the medication tubes.   If your medication is too expensive, please contact our office at [phone] option 4 or send us  a message through MyChart.   We are unable to tell what your co-pay for medications will be in advance as this is different depending on your insurance coverage. However, we may be able to find a substitute medication at lower cost or  fill out paperwork to get insurance to cover a needed medication.   If a prior authorization is required to get your medication covered by your insurance company, please allow us  1-2 business days to complete this process.  Drug prices often vary depending on where the prescription is filled and some pharmacies may offer cheaper prices.  The website www.goodrx.com contains coupons for medications through different pharmacies. The prices here do not  account for what the cost may be with help from insurance (it may be cheaper with your insurance), but the website can give you the price if you did not use any insurance.  - You can print the associated coupon and take it with your prescription to the pharmacy.  - You may also stop by our office during regular business hours and pick up a GoodRx coupon card.  - If you need your prescription sent electronically to a different pharmacy, notify our office through [facility] or by phone at [phone] option 4.     Si Usted Necesita Algo Despus de Su Visita  Tambin puede enviarnos un mensaje a travs de Clinical Cytogeneticist. Por lo general respondemos a los mensajes de MyChart en el transcurso de 1 a 2 das hbiles.  Para renovar recetas, por favor pida a su farmacia que se ponga en contacto con nuestra oficina. Randi lakes de fax es [place] [phone].  Si tiene un asunto urgente cuando la clnica est cerrada y que no puede esperar hasta el siguiente da hbil, puede llamar/localizar a su doctor(a) al nmero que aparece a continuacin.   Por favor, tenga en cuenta que aunque hacemos todo lo posible para estar disponibles para asuntos urgentes fuera del horario de [place], no estamos disponibles las 24 horas del da, los 7 [address]  [address] de la [place].   Si tiene un problema urgente y no puede comunicarse con nosotros, puede optar por buscar atencin mdica  en el consultorio de su doctor(a), en una clnica privada, en un centro de atencin urgente o en una sala de emergencias.  Si tiene engineer, drilling, por favor llame inmediatamente al 911 o vaya a la sala de emergencias.  Nmeros de bper  - Dr. Hester: [phone]  - Dra. Jackquline: [phone]  - Dr. Claudene: [phone]  - Dra. Kitts: [phone]  En caso de inclemencias del [place], por favor llame a nuestra lnea principal al [phone] para una actualizacin sobre el estado de cualquier retraso o cierre.  Consejos para la  medicacin en dermatologa: Por favor, guarde las cajas en las que vienen los medicamentos de uso tpico para ayudarle a seguir las instrucciones sobre dnde y cmo usarlos. Las farmacias generalmente imprimen las instrucciones del medicamento slo en las cajas y no directamente en los tubos del [place].   Si su medicamento es muy caro, por favor, pngase en contacto con landry rieger llamando al [phone] y presione la opcin 4 o envenos un mensaje a travs de Clinical Cytogeneticist.   No podemos decirle cul ser su copago por los medicamentos por adelantado ya que esto es diferente dependiendo de la cobertura de su seguro. Sin embargo, es posible que podamos encontrar un medicamento sustituto a audiological scientist un formulario para que el seguro cubra el medicamento que se considera necesario.   Si se requiere una autorizacin previa para que su compaa de seguros cubra su medicamento, por favor permtanos de 1 a 2 das hbiles para completar este proceso.  Los precios de los medicamentos varan con frecuencia dependiendo del environmental consultant de  dnde se surte la receta y alguna farmacias pueden ofrecer precios ms baratos.  El sitio web www.goodrx.com tiene cupones para medicamentos de health and safety inspector. Los precios aqu no tienen en cuenta lo que podra costar con la ayuda del seguro (puede ser ms barato con su seguro), pero el sitio web puede darle el precio si no utiliz tourist information centre manager.  - Puede imprimir el cupn correspondiente y llevarlo con su receta a la farmacia.  - Tambin puede pasar por nuestra oficina durante el horario de atencin regular y education officer, museum una tarjeta de cupones de GoodRx.  - Si necesita que su receta se enve electrnicamente a una farmacia diferente, informe a nuestra oficina a travs de MyChart de [place] o por telfono llamando al [phone] y presione la opcin 4.

## 2023-12-31 ENCOUNTER — Encounter: Payer: Self-pay | Admitting: Family Medicine

## 2023-12-31 ENCOUNTER — Telehealth: Payer: Self-pay | Admitting: *Deleted

## 2023-12-31 ENCOUNTER — Ambulatory Visit: Payer: Self-pay | Admitting: Family Medicine

## 2023-12-31 DIAGNOSIS — H6123 Impacted cerumen, bilateral: Secondary | ICD-10-CM | POA: Diagnosis not present

## 2023-12-31 DIAGNOSIS — H900 Conductive hearing loss, bilateral: Secondary | ICD-10-CM | POA: Diagnosis not present

## 2023-12-31 LAB — URINE CULTURE
MICRO NUMBER:: 17237219
SPECIMEN QUALITY:: ADEQUATE

## 2023-12-31 NOTE — Telephone Encounter (Signed)
 Copied from CRM #[id number]. Topic: Clinical - Refused Triage >> Dec 31, 2023 12:51 PM Harlene ORN wrote: Patient/caller voiced complaints of distress of her kidney stone surgery. . Declined transfer to triage.

## 2023-12-31 NOTE — Telephone Encounter (Signed)
 Relayed message to the Patient. She had finished the bactrim  on monday. Still has the spironoloctone left that she takes everyday. The results came in that she has another UTI as [facility]. Please have a nurse call back to discuss with the patient what next steps she needs to take.

## 2023-12-31 NOTE — Telephone Encounter (Signed)
 Copied from CRM [id number]. Topic: Clinical - Red Word Triage >> Dec 31, 2023 12:50 PM Harlene ORN wrote: Red Word that prompted transfer to Nurse Triage: distressed from her kidney stone surgery

## 2023-12-31 NOTE — Telephone Encounter (Signed)
 Copied from CRM [id number]. Topic: General - Other >> Dec 31, 2023 10:47 AM Mackenzie Bonilla wrote: Reason for CRM: patient called stating she saw MD [facility] on Friday for a urine issue and she does not know which doctor to follow up with in regards to her urine results. Patient read that you are not suppose to take bactrim  with spironolactone  but she had already finish the medication before she read it [phone]

## 2023-12-31 NOTE — Telephone Encounter (Signed)
 Will route to PCP and Dr. Avelina who treated UTI

## 2023-12-31 NOTE — Telephone Encounter (Signed)
 Returned Tida's call.  Her mailbox is full so I was unable to leave message.  If patient calls back please let her know: It is okay to take  the bactrim  and spironolactone  together temporarily as it was only a 3-day course.  Patient has already reviewed her urine culture  results via My.Chart.

## 2023-12-31 NOTE — Telephone Encounter (Signed)
 I have already forward message to Dr. Avelina that patient sent earlier about next steps.  Dr. Bedsole will respond back to patient in regards to this.

## 2024-01-06 ENCOUNTER — Other Ambulatory Visit: Payer: Self-pay | Admitting: Family Medicine

## 2024-01-07 DIAGNOSIS — M9904 Segmental and somatic dysfunction of sacral region: Secondary | ICD-10-CM | POA: Diagnosis not present

## 2024-01-07 DIAGNOSIS — M5431 Sciatica, right side: Secondary | ICD-10-CM | POA: Diagnosis not present

## 2024-01-07 DIAGNOSIS — M9905 Segmental and somatic dysfunction of pelvic region: Secondary | ICD-10-CM | POA: Diagnosis not present

## 2024-01-07 DIAGNOSIS — M9903 Segmental and somatic dysfunction of lumbar region: Secondary | ICD-10-CM | POA: Diagnosis not present

## 2024-01-08 ENCOUNTER — Encounter: Attending: Family Medicine | Admitting: Dietician

## 2024-01-08 ENCOUNTER — Encounter: Payer: Self-pay | Admitting: Dietician

## 2024-01-08 DIAGNOSIS — E119 Type 2 diabetes mellitus without complications: Secondary | ICD-10-CM | POA: Diagnosis present

## 2024-01-08 NOTE — Progress Notes (Signed)
 Diabetes Self-Management Education  Visit Type: Follow-up  Appt. Start Time: 1400 Appt. End Time: 1445  01/08/2024  [name], identified by name and date of birth, is a 58 y.o. female with a diagnosis of Diabetes:  .   ASSESSMENT Pt reports using Freestyle Libre3 CGM: CGM Results from download:   % Time CGM active:   100 %   (Goal >70%)  Average glucose:   85 mg/dL for 90 days  Time in range (70-180 mg/dL):   88 %   (Goal >29%)  Time High (181-250 mg/dL):   0 %   (Goal < 74%)  Time Very High (>250 mg/dL):    0 %   (Goal < 5%)  Time Low (54-69 mg/dL):   12 %   (Goal <5%)  Time Very Low (<54 mg/dL):   0 %   (Goal <8%)  Daily patterns indicate glucose remains stable between 70-100 mg/dL, less frequent lows but still above goal percentage for lows.  Pt reports continuing Mounjaro @12 .5 mg weekly and Tresiba @16u  at night, less frequent hypoglycemia. Pt reports multiple procedures to break apart and remove kidney stones (12/03/2023, 12/17/2023), and had ureter stent removed shortly after (12/19/2023). Pt reports more GI discomfort after eating at times, has taken full days off from eating and just had water /tea out of concern for nausea. Pt states they are not drinking enough water , drinking 3 16 oz bottles a day. Pt reports making more recipes at home, and ordering less pre-made meals. Pt states they are trying to make recipes lower in carbs, salt, and fat.   Diabetes Self-Management Education - 01/08/24 1503       Visit Information   Visit Type Follow-up      Pre-Education Assessment   Patient understands the diabetes disease and treatment process. Needs Review    Patient understands incorporating nutritional management into lifestyle. Needs Review    Patient undertands incorporating physical activity into lifestyle. Needs Review    Patient understands using medications safely. Needs Review    Patient understands monitoring blood glucose, interpreting and using results  Needs Review    Patient understands prevention, detection, and treatment of acute complications. Needs Review    Patient understands prevention, detection, and treatment of chronic complications. Needs Review    Patient understands how to develop strategies to address psychosocial issues. Needs Review    Patient understands how to develop strategies to promote health/change behavior. Needs Review      Complications   How often do you check your blood sugar? > 4 times/day    Fasting Blood glucose range (mg/dL) 29-870;<29    Postprandial Blood glucose range (mg/dL) 29-870    Can you tell when your blood sugar is low? Yes      Dietary Intake   Breakfast Atkins shake (15g PRO)    Snack (morning) 2 PB crackers    Lunch Fried chicken strip, 1/2 honey Dispensing Optician) Water       Activity / Exercise   Activity / Exercise Type ADL's      Patient Education   Disease Pathophysiology Explored patient's options for treatment of their diabetes    Healthy Eating Role of diet in the treatment of diabetes and the relationship between the three main macronutrients and blood glucose level    Medications Reviewed patients medication for diabetes, action, purpose, timing of dose and side effects.    Monitoring Taught/evaluated CGM (comment)    Acute complications Taught prevention, symptoms, and  treatment of hypoglycemia - the 15 rule.    Chronic complications Relationship between chronic complications and blood glucose control    Diabetes Stress and Support Identified and addressed patients feelings and concerns about diabetes;Worked with patient to identify barriers to care and solutions;Helped patient identify a support system for diabetes management    Lifestyle and Health Coping Lifestyle issues that need to be addressed for better diabetes care      Individualized Goals (developed by patient)   Nutrition Follow meal plan discussed    Medications take my medication as prescribed     Monitoring  Consistenly use CGM    Problem Solving Eating Pattern;Addressing barriers to behavior change   Fear of glucose rising   Reducing Risk examine blood glucose patterns    Health Coping Discuss barriers to diabetes care with support person/system (comment specifics as needed)      Patient Self-Evaluation of Goals - Patient rates self as meeting previously set goals (% of time)   Nutrition 50 - 75 % (half of the time)    Physical Activity < 25% (hardly ever/never)    Medications >75% (most of the time)    Monitoring >75% (most of the time)    Problem Solving and behavior change strategies  25 - 50% (sometimes)    Reducing Risk (treating acute and chronic complications) 25 - 50% (sometimes)    Health Coping 25 - 50% (sometimes)      Post-Education Assessment   Patient understands the diabetes disease and treatment process. Needs Review    Patient understands incorporating nutritional management into lifestyle. Needs Review    Patient undertands incorporating physical activity into lifestyle. Needs Review    Patient understands using medications safely. Comphrehends key points    Patient understands monitoring blood glucose, interpreting and using results Comprehends key points    Patient understands prevention, detection, and treatment of acute complications. Comprehends key points    Patient understands prevention, detection, and treatment of chronic complications. Needs Review    Patient understands how to develop strategies to address psychosocial issues. Needs Review    Patient understands how to develop strategies to promote health/change behavior. Needs Review      Outcomes   Expected Outcomes Demonstrated interest in learning but significant barriers to change    Future DMSE 4-6 wks    Program Status Not Completed      Subsequent Visit   Since your last visit have you continued or begun to take your medications as prescribed? Yes    Since your last visit have you had your  blood pressure checked? Yes    Is your most recent blood pressure lower, unchanged, or higher since your last visit? Lower    Since your last visit have you experienced any weight changes? No change    Since your last visit, are you checking your blood glucose at least once a day? Yes   CGM         Individualized Plan for Diabetes Self-Management Training:   Learning Objective:  Patient will have a greater understanding of diabetes self-management. Patient education plan is to attend individual and/or group sessions per assessed needs and concerns.   Plan:   Patient Instructions  Have a source of protein at EVERY meal! This is very important!  Continue to work on keeping your glucose above 70 mg/dL all the time. Keeping it closer to 80-90 mg/dL fasting and 899-879 mg/dL after you eat may help reduce brain fog!!  [email or web address] for more  recipe ideas!  Expected Outcomes:  Demonstrated interest in learning but significant barriers to change  Education material provided: Protein list, Kidney Stone Nutrition Therapy  If problems or questions, patient to contact team via:  Phone and Email  Future DSME appointment: 4-6 wks

## 2024-01-08 NOTE — Patient Instructions (Addendum)
 Have a source of protein at EVERY meal! This is very important!  Continue to work on keeping your glucose above 70 mg/dL all the time. Keeping it closer to 80-90 mg/dL fasting and 899-879 mg/dL after you eat may help reduce brain fog!!  [email or web address] for more recipe ideas!

## 2024-01-14 ENCOUNTER — Encounter: Admitting: Dietician

## 2024-01-15 ENCOUNTER — Ambulatory Visit
Admission: RE | Admit: 2024-01-15 | Discharge: 2024-01-15 | Disposition: A | Source: Ambulatory Visit | Attending: Urology

## 2024-01-15 DIAGNOSIS — N200 Calculus of kidney: Secondary | ICD-10-CM | POA: Insufficient documentation

## 2024-01-17 ENCOUNTER — Other Ambulatory Visit: Payer: Self-pay | Admitting: Family Medicine

## 2024-01-21 ENCOUNTER — Ambulatory Visit: Admitting: Podiatry

## 2024-01-22 ENCOUNTER — Ambulatory Visit: Admitting: Physician Assistant

## 2024-01-22 VITALS — BP 109/78 | HR 95 | Ht 64.0 in | Wt 212.0 lb

## 2024-01-22 DIAGNOSIS — N200 Calculus of kidney: Secondary | ICD-10-CM | POA: Diagnosis not present

## 2024-01-22 DIAGNOSIS — R8290 Unspecified abnormal findings in urine: Secondary | ICD-10-CM

## 2024-01-22 DIAGNOSIS — E65 Localized adiposity: Secondary | ICD-10-CM | POA: Diagnosis not present

## 2024-01-22 LAB — URINALYSIS, COMPLETE
Bilirubin, UA: NEGATIVE
Glucose, UA: NEGATIVE
Ketones, UA: NEGATIVE
Nitrite, UA: POSITIVE — AB
Protein,UA: NEGATIVE
Specific Gravity, UA: 1.015 (ref 1.005–1.030)
Urobilinogen, Ur: 0.2 mg/dL (ref 0.2–1.0)
pH, UA: 6 (ref 5.0–7.5)

## 2024-01-22 LAB — MICROSCOPIC EXAMINATION: WBC, UA: >30 /HPF — AB (ref 0–5)

## 2024-01-22 MED ORDER — SULFAMETHOXAZOLE-TRIMETHOPRIM 800-160 MG PO TABS: 1.0000 | ORAL_TABLET | Freq: Two times a day (BID) | ORAL | 0 refills | Status: AC

## 2024-01-22 NOTE — Patient Instructions (Signed)
[facility] Instructions °[facility] ° °You will receive a box/kit in the mail that will have a urine jug and instructions in the kit.  When the box arrives you will need to call our office [phone] to schedule a LAB appointment. ° °You will need to do a 24hour urine and this should be done during the days that our office will be open.  For example any day from Sunday through Thursday. ° °If you take Vitamin C 100mg or greater please stop this 5 days prior to collection. ° °How to collect the urine sample: On the day you start the urine sample this 1st morning urine should NOT be collected.  For the rest of the day including all night urines should be collected.  On the next morning the 1st urine should be collected and then you will be finished with the urine collections. ° °You will need to bring the box with you on your LAB appointment day after urine has been collected and all instructions are complete in the box.  Your blood will be drawn and the box will be collected by our Lab employee to be sent off for analysis. ° °When urine and blood is complete you will need to schedule a follow up appointment for lab results. °

## 2024-01-22 NOTE — Addendum Note (Signed)
 Addended by: Notnamed Croucher P on: 01/22/2024 05:27 PM   Modules accepted: Level of Service

## 2024-01-22 NOTE — Progress Notes (Addendum)
 01/22/2024 1:21 PM   Mackenzie Bonilla Aug 06, 1965 [id number]  CC: Chief Complaint  Patient presents with   Nephrolithiasis   HPI: Mackenzie Bonilla is a 58 y.o. female with PMH diabetes, sleep apnea, chronic constipation, and nephrolithiasis who underwent staged left ureteroscopy with laser lithotripsy and stent exchange with Dr. Georganne on 12/17/2023 for management of a large staghorn left renal stone who presents today for postop follow-up.   Renal ultrasound dated 01/15/2024 showed no hydronephrosis or shadowing stones.  There is slight enlargement of the left extrarenal pelvis and left parenchymal thinning.  Stone analysis finalized with 60% uric acid, 40% calcium oxalate monohydrate.  Per chart review, she previously took allopurinol  for stone prevention, though today she does not recall having taken this.  It does not appear in her current medication list.  Stent was removed in clinic on POD 2.  Today she reports ongoing fatigue and anorexia.  Her urine is cloudy, though she denies dysuria.  She has been struggling to find things to eat that are compliant with diabetes recommendations, though she is working with a dietitian to help her develop recipes.  She reports a traumatic experience with a prior in and out catheterization.  She does not wish to experience further catheterization attempts.  She admits to difficulty obtaining urine samples due to habitus.  She has been struggling with loose skin after recent 100+ pound weight loss.  She is primarily bothered by her pannus and labia.  She previously saw Dr. Lowery, who declined to pursue panniculectomy due to her BMI and recommended follow-up with an academic medical center.  However, she has lost an additional 60 pounds since that time.  She states she would be unable to get to [facility] or [place] and would rather to continue care in [place].  In-office UA today positive for trace intact blood, nitrites, and 3+ leukocytes;  urine microscopy with >30 WBCs/HPF, 3-10 RBCs/HPF, and many bacteria.   PMH: Past Medical History:  Diagnosis Date   Allergy    allergic rhinitis   Anemia    Anxiety    Arthritis    osteoarthritis   Asthma    Claustrophobia    does not like oxygen mask covering face   Depression    Diabetes mellitus without complication (HCC)    Fatty liver 07/2008   abd. ultrasound - fatty liver ; slt dilated cbd (no stones ) 06/10// abd.  ultrasound normal on 04/2006   Fibromyalgia    GERD (gastroesophageal reflux disease)    EGD negative 06/2001// EGD erythematous mucosa, polyp 08/2008   High uric acid in 24 hour urine specimen    Hyperhidrosis    Hyperlipidemia    Kidney stones    Lymphedema    Obesity    PCOS (polycystic ovarian syndrome)    Sleep apnea    Tachycardia    TMJ (dislocation of temporomandibular joint)     Surgical History: Past Surgical History:  Procedure Laterality Date   BIOPSY  11/01/2022   Procedure: BIOPSY;  Surgeon: Jinny Carmine, MD;  Location: [facility] ENDOSCOPY;  Service: Endoscopy;;   BREAST BIOPSY Right 2016   neg   BREAST LUMPECTOMY WITH RADIOACTIVE SEED LOCALIZATION Right 08/02/2014   Procedure: BREAST LUMPECTOMY WITH RADIOACTIVE SEED LOCALIZATION;  Surgeon: Donnice Bury, MD;  Location: [facility] OR;  Service: General;  Laterality: Right;   COLONOSCOPY  08/12/2012   Completed by Dr. Luba, normal exam.    COLONOSCOPY WITH PROPOFOL  N/A 11/01/2022   Procedure: COLONOSCOPY WITH  PROPOFOL ;  Surgeon: Jinny Carmine, MD;  Location: [facility] ENDOSCOPY;  Service: Endoscopy;  Laterality: N/A;   CYSTOSCOPY W/ URETERAL STENT PLACEMENT Left 11/14/2023   Procedure: CYSTOSCOPY, WITH RETROGRADE PYELOGRAM AND URETERAL STENT INSERTION;  Surgeon: Georganne Penne SAUNDERS, MD;  Location: [facility] ORS;  Service: Urology;  Laterality: Left;   CYSTOSCOPY/URETEROSCOPY/HOLMIUM LASER/STENT PLACEMENT Left 12/03/2023   Procedure: CYSTOSCOPY/URETEROSCOPY/HOLMIUM LASER/STENT EXCHANGE;  Surgeon: Georganne Penne SAUNDERS, MD;  Location: [facility] ORS;  Service: Urology;  Laterality: Left;   CYSTOSCOPY/URETEROSCOPY/HOLMIUM LASER/STENT PLACEMENT Left 12/17/2023   Procedure: CYSTOSCOPY/URETEROSCOPY/HOLMIUM LASER/STENT EXCHANGE;  Surgeon: Georganne Penne SAUNDERS, MD;  Location: [facility] ORS;  Service: Urology;  Laterality: Left;   ENDOMETRIAL BIOPSY  01/2004   ESOPHAGOGASTRODUODENOSCOPY     ESOPHAGOGASTRODUODENOSCOPY (EGD) WITH PROPOFOL  N/A 08/18/2019   Procedure: ESOPHAGOGASTRODUODENOSCOPY (EGD) WITH PROPOFOL ;  Surgeon: Jinny Carmine, MD;  Location: [facility] ENDOSCOPY;  Service: Endoscopy;  Laterality: N/A;   ESOPHAGOGASTRODUODENOSCOPY (EGD) WITH PROPOFOL  N/A 11/01/2022   Procedure: ESOPHAGOGASTRODUODENOSCOPY (EGD) WITH PROPOFOL ;  Surgeon: Jinny Carmine, MD;  Location: [facility] ENDOSCOPY;  Service: Endoscopy;  Laterality: N/A;   FOOT SURGERY     SEPTOPLASTY     two times   TONSILLECTOMY     UTERINE FIBROID SURGERY  06/2005   ablation   uterine tumor  09/2001   VAGUS NERVE STIMULATOR INSERTION      Home Medications:  Allergies as of 01/22/2024       Reactions   Cephalexin  Diarrhea   Codeine    rash   Duloxetine Other (See Comments)   Vivid dreams   Levaquin  [levofloxacin ] Nausea Only   Lisinopril Cough   Metformin  And Related Diarrhea   GI side effects    Quetiapine Other (See Comments)   Sertraline Hcl    REACTION: vivid dreams   Triazolam    REACTION: ? reaction   Zaleplon    Zocor [simvastatin - High Dose]    achey   Zolpidem Tartrate    hallucinations   Zyrtec [cetirizine]    Sedation    Hydrocodone  Itching, Rash   REACTION: redness rash itching   Norelgestromin-eth Estradiol     Patch didn't stick to skin        Medication List        Accurate as of January 22, 2024  1:21 PM. If you have any questions, ask your nurse or doctor.          Accu-Chek Guide Test test strip Generic drug: glucose blood SMARTSIG:Strip(s)   Accu-Chek Softclix Lancets lancets SMARTSIG:Lancet Topical    acetaminophen  500 MG tablet Commonly known as: TYLENOL  Take 500 mg by mouth every 6 (six) hours as needed (pain.).   albuterol  108 (90 Base) MCG/ACT inhaler Commonly known as: VENTOLIN  HFA INHALE 2 PUFFS INTO THE LUNGS EVERY 6 HOURS AS NEEDED FOR WHEEZING.   ALPRAZolam  1 MG tablet Commonly known as: XANAX  Take 2 mg by mouth See admin instructions. Take 2 tablets (2 mg) by mouth scheduled at bedtime, may take 1 tablet (1 mg) by mouth up to twice daily if needed for anxiety.   ammonium lactate  12 % cream Commonly known as: AMLACTIN Apply 1 Application topically as needed for dry skin.   BD Pen Needle Nano U/F 32G X 4 MM Misc Generic drug: Insulin  Pen Needle daily.   cyclobenzaprine  5 MG tablet Commonly known as: FLEXERIL  TAKE 1 TABLET BY MOUTH 3 TIMES DAILY AS NEEDED FOR MUSCLE SPASMS   desvenlafaxine 50 MG 24 hr tablet Commonly known as: PRISTIQ Take 50 mg by mouth in  the morning. What changed: how much to take   dimenhyDRINATE 50 MG tablet Commonly known as: DRAMAMINE Take 50 mg by mouth every 8 (eight) hours as needed for nausea.   docusate sodium  100 MG capsule Commonly known as: COLACE TAKE ONE CAPSULE BY MOUTH TWICE DAILY.   famotidine  20 MG tablet Commonly known as: PEPCID  TAKE ONE TABLET BY MOUTH TWICE DAILY AS NEEDED FOR FOR HEARTBURN OR INDIGESTION   Fifty50 Glucose Meter 2.0 w/Device Kit Use as directed   FLUoxetine  20 MG capsule Commonly known as: PROZAC  Take 20 mg by mouth in the morning.   fluticasone  50 MCG/ACT nasal spray Commonly known as: FLONASE  Place 1 spray into both nostrils daily as needed for allergies.   FreeStyle Libre 3 Plus Sensor Misc   gabapentin  300 MG capsule Commonly known as: NEURONTIN  Take 1 capsule (300 mg total) by mouth at bedtime.   gabapentin  100 MG capsule Commonly known as: NEURONTIN  TAKE 1 CAPSULE BY MOUTH ONCE DAILY WITH BREAKFAST   GAS-X PO Take 2 tablets by mouth as needed (gas).   hydrocortisone  2.5 %  rectal cream Commonly known as: Anusol -HC Apply to affected (hemorrhoid area) once daily at bedtime as needed for no more than 10 days in a row What changed:  how much to take how to take this when to take this reasons to take this additional instructions   ibuprofen  200 MG tablet Commonly known as: ADVIL  Take 400 mg by mouth every 6 (six) hours as needed for headache or moderate pain (pain score 4-6).   ketoconazole  2 % shampoo Commonly known as: NIZORAL  Massage into scalp and let sit several minutes before rinsing. Use 2x/week.   linaclotide  290 MCG Caps capsule Commonly known as: Linzess  Take 1 capsule (290 mcg total) by mouth every other day.   linaclotide  145 MCG Caps capsule Commonly known as: LINZESS  Take 1 capsule (145 mcg total) by mouth every other day.   losartan 25 MG tablet Commonly known as: COZAAR Take 25 mg by mouth in the morning.   minoxidil  2.5 MG tablet Commonly known as: LONITEN  Take 1 tablet (2.5 mg total) by mouth daily.   mometasone  0.1 % cream Commonly known as: ELOCON  Apply to affected area left thigh twice daily as needed for flares/itch. Avoid applying to face, groin, and axilla. Use as directed. Long-term use can cause thinning of the skin.   Mounjaro 12.5 MG/0.5ML Pen Generic drug: tirzepatide Inject 12.5 mg into the skin every Sunday. What changed: when to take this   omeprazole  40 MG capsule Commonly known as: PRILOSEC Take 1 capsule (40 mg total) by mouth in the morning and at bedtime.   oxybutynin  15 MG 24 hr tablet Commonly known as: DITROPAN  XL Take 1 tablet (15 mg total) by mouth daily.   pravastatin  20 MG tablet Commonly known as: PRAVACHOL  TAKE ONE TABLET BY MOUTH AT BEDTIME   spironolactone  100 MG tablet Commonly known as: ALDACTONE  TAKE 1 TABLET BY MOUTH DAILY   sulfamethoxazole -trimethoprim  800-160 MG tablet Commonly known as: BACTRIM  DS Take 1 tablet by mouth 2 (two) times daily.   SYSTANE OP Place 1-2 drops  into both eyes 3 (three) times daily as needed (dry/irritated eyes.).   traMADol  50 MG tablet Commonly known as: ULTRAM  Take 1 tablet (50 mg total) by mouth every 12 (twelve) hours as needed for severe pain (pain score 7-10). Caution of sedation   traZODone  150 MG tablet Commonly known as: DESYREL  Take 300 mg by mouth at bedtime.  Tresiba FlexTouch 100 UNIT/ML FlexTouch Pen Generic drug: insulin  degludec Inject 16 Units into the skin at bedtime.   zinc oxide 11.3 % Crea cream Commonly known as: BALMEX Apply 1 application  topically 2 (two) times daily as needed (skin irritation/skin lesions.).        Allergies:  Allergies  Allergen Reactions   Cephalexin  Diarrhea   Codeine     rash   Duloxetine Other (See Comments)    Vivid dreams   Levaquin  [Levofloxacin ] Nausea Only   Lisinopril Cough   Metformin  And Related Diarrhea    GI side effects    Quetiapine Other (See Comments)   Sertraline Hcl     REACTION: vivid dreams   Triazolam     REACTION: ? reaction   Zaleplon    Zocor [Simvastatin - High Dose]     achey   Zolpidem Tartrate     hallucinations   Zyrtec [Cetirizine]     Sedation    Hydrocodone  Itching and Rash    REACTION: redness rash itching   Norelgestromin-Eth Estradiol      Patch didn't stick to skin    Family History: Family History  Problem Relation Age of Onset   Hypertension Father    Cancer Father        pancreatic cancer   Hypertension Sister    Heart disease Maternal Grandmother 27       MI   Diabetes Maternal Grandmother    Heart disease Paternal Grandmother 18       MI   Diabetes Paternal Grandmother    Breast cancer Neg Hx     Social History:   reports that she has never smoked. She has never used smokeless tobacco. She reports that she does not drink alcohol and does not use drugs.  Physical Exam: BP 109/78   Pulse 95   Ht 5' 4 (1.626 m)   Wt 212 lb (96.2 kg)   SpO2 98%   BMI 36.39 kg/m   Constitutional:  Alert and  oriented, no acute distress, nontoxic appearing HEENT: [place], AT Cardiovascular: No clubbing, cyanosis, or edema Respiratory: Normal respiratory effort, no increased work of breathing Skin: No rashes, bruises or suspicious lesions Neurologic: Grossly intact, no focal deficits, moving all 4 extremities Psychiatric: Normal mood and affect  Laboratory Data: Results for orders placed or performed in visit on 01/22/24  Microscopic Examination   Collection Time: 01/22/24  2:06 PM   Urine  Result Value Ref Range   WBC, UA >30 (A) 0 - 5 /hpf   RBC, Urine 3-10 (A) 0 - 2 /hpf   Epithelial Cells (non renal) 0-10 0 - 10 /hpf   Bacteria, UA Many (A) None seen/Few  Urinalysis, Complete   Collection Time: 01/22/24  2:06 PM  Result Value Ref Range   Specific Gravity, UA 1.015 1.005 - 1.030   pH, UA 6.0 5.0 - 7.5   Color, UA Yellow Yellow   Appearance Ur Slightly cloudy Clear   Leukocytes,UA 3+ (A) Negative   Protein,UA Negative Negative/Trace   Glucose, UA Negative Negative   Ketones, UA Negative Negative   RBC, UA Trace (A) Negative   Bilirubin, UA Negative Negative   Urobilinogen, Ur 0.2 0.2 - 1.0 mg/dL   Nitrite, UA Positive (A) Negative   Microscopic Examination See below:    *Note: Due to a large number of results and/or encounters for the requested time period, some results have not been displayed. A complete set of results can be found in Results  Review.   Pertinent Imaging: Results for orders placed during the hospital encounter of 01/15/24  Ultrasound renal complete  Narrative CLINICAL DATA:  History of left kidney stone  EXAM: RENAL / URINARY TRACT ULTRASOUND COMPLETE  COMPARISON:  CT 11/14/2023  FINDINGS: Right Kidney:  Renal measurements: 11.4 x 6.2 x 6 cm = volume: 220 mL. Echogenicity within normal limits. No mass or hydronephrosis visualized.  Left Kidney:  Renal measurements: 11 x 4.3 x 5.2 cm = volume: 128.6 mL. Slightly enlarged left extrarenal pelvis without  calyceal dilatation. No shadowing stones within the left kidney. Diffuse parenchymal thinning of the left kidney consistent with cortical scarring and atrophy.  Bladder:  Appears normal for degree of bladder distention.  Other:  None.  IMPRESSION: 1. Negative for hydronephrosis or shadowing stone. Slightly enlarged left extrarenal pelvis 2. Diffuse parenchymal thinning of the left kidney consistent with cortical scarring and atrophy.   Electronically Signed By: Luke Bun M.D. On: 01/21/2024 23:29  I personally reviewed the images referenced above and note no hydronephrosis or shadowing stones.  Assessment & Plan:   1. Left nephrolithiasis (Primary) Postop renal ultrasound with no hydronephrosis.  She has a dilated right collecting system and some left cortical atrophy consistent with her recent large staghorn stone.  We discussed that I anticipate these findings will be chronic.  Stone is mixed calcium oxalate and uric acid in composition.  She is no longer on allopurinol .  I would like to get a Litholink on her to guide further management. - Litholink 24Hr Urine Panel - Litholink Serum Panel; Future  2. Cloudy urine UA appears grossly positive, however this sample was collected at home this morning prior to her visit.  Strongly suspect a contaminated urine sample, and she is declining I&O catheterization to rule this out.  Will treat today, though in the future need to keep this in mind before treating. - Urinalysis, Complete - CULTURE, URINE COMPREHENSIVE - sulfamethoxazole -trimethoprim  (BACTRIM  DS) 800-160 MG tablet; Take 1 tablet by mouth 2 (two) times daily for 5 days.  Dispense: 10 tablet; Refill: 0  3. Abdominal pannus I offered her referral to [facility] or [facility] per Dr. Ace prior recommendations, however she would struggle with transportation to get there.  Since she has had interval weight loss of an additional 60 pounds, I recommended she reach back out to Dr.  Lowery to see if she now meets surgical criteria with her.  I spent 55 minutes on the day of the encounter to include pre-visit record review, face-to-face time with the patient, and post-visit ordering of tests.   Return in about 2 months (around 03/24/2024) for Litholink results.  Lucie Hones, PA-C  [facility] [place] [address] [place], [place] [address] [phone]

## 2024-01-28 ENCOUNTER — Ambulatory Visit: Admitting: Podiatry

## 2024-01-28 DIAGNOSIS — B351 Tinea unguium: Secondary | ICD-10-CM

## 2024-01-28 DIAGNOSIS — M79674 Pain in right toe(s): Secondary | ICD-10-CM | POA: Diagnosis not present

## 2024-01-28 DIAGNOSIS — M79675 Pain in left toe(s): Secondary | ICD-10-CM | POA: Diagnosis not present

## 2024-01-28 LAB — CULTURE, URINE COMPREHENSIVE

## 2024-01-31 ENCOUNTER — Ambulatory Visit: Admitting: Podiatry

## 2024-02-03 LAB — OPHTHALMOLOGY REPORT-SCANNED

## 2024-02-04 ENCOUNTER — Ambulatory Visit: Admitting: Podiatry

## 2024-02-10 NOTE — Progress Notes (Unsigned)
 "   02/11/2024 Mackenzie Bonilla [id number] 1965/02/26  Gastroenterology Office Note    Referring Provider: Randeen Laine LABOR, MD Primary Care Physician:  Tower, Laine LABOR, MD  Primary GI Provider: Jinny Carmine, MD  Chief Complaint  Patient presents with   Follow-up    Gerd-taking omeprazole /pepcid  and works fine-  Taking Linzess  alternating with 145 and 290- chewable fibers daily-     History of Present Illness   Mackenzie Bonilla is a 58 y.o. female with PMHX of diabetes presenting today at the request of Tower, Laine LABOR, MD due to constipation and GERD   Discussed the use of AI scribe software for clinical note transcription with the patient, who gave verbal consent to proceed.  Patient has history of IBS-constipation managed with Linzess , alternating between 290 mcg every other day and 145 mcg on remaining days. The higher dose causes severe, unpredictable diarrhea with episodes of incontinence, while the lower dose alleviates abdominal pain but does not improve stool frequency. Bowel movements occur once every week to week and a half, are difficult to pass, and require significant straining.  Patient reports that sometimes when she wipes there is stool in the tissue but she is unable to push anything out, feels like this may be contributing to her urinary issues because she is not able to clean self as well.  Also lost a lot of weight intentionally and this has created excess skin. She may several days of abdominal bloating, fullness, and back pain typically precede bowel movements. No warning precedes episodes of diarrhea. Cramping and fullness sometimes lead her to limit oral intake to liquids and protein shakes.  She drinks a lot of water  throughout the day.   Multiple over-the-counter agents have been trialed, including Miralax, Mirafiber chewables, Citrucel, Amerifast, and Colace, but are not used daily due to difficulty tolerating multiple agents.  Mounjaro is taken weekly.  Prilosec is taken regularly, and tramadol  is rarely used, with Tylenol  preferred for pain management.  Overall, patient does not feel like the Linzess  is deviating her constipation. She has not tried other prescription secretagogues.  Patient reports having issues with recurrent kidney stones and UTIs and has required surgeries for stone removal.  Patient wants to know what foods to avoid to have uric acid and calcium oxalate. Urinary urgency has improved since kidney stone surgeries. She has a history of recurrent urinary tract infections that are difficult to clear.  Patient seen by PCP on 10/17/2023 with IBS-constipation, GERD, dysphagia.  Continued on omeprazole  40 mg twice a day along with Pepcid  20 mg once or twice daily as needed.  Patient last seen by Ellouise Console, PA on 11/29/2022 for follow-up of dysphagia, GERD, IBS-C.    11/01/2022 upper endoscopy - Normal esophagus. - A single gastric polyp.  Resected and retrieved. - Normal examined duodenum  11/01/2022 colonoscopy -Nonbleeding internal hemorrhoids. -No specimens collected.  Repeat colonoscopy in 10 years for screening purposes.  Past Medical History:  Diagnosis Date   Allergy    allergic rhinitis   Anemia    Anxiety    Arthritis    osteoarthritis   Asthma    Claustrophobia    does not like oxygen mask covering face   Depression    Diabetes mellitus without complication (HCC)    Fatty liver 07/2008   abd. ultrasound - fatty liver ; slt dilated cbd (no stones ) 06/10// abd.  ultrasound normal on 04/2006   Fibromyalgia    GERD (gastroesophageal reflux disease)  EGD negative 06/2001// EGD erythematous mucosa, polyp 08/2008   High uric acid in 24 hour urine specimen    Hyperhidrosis    Hyperlipidemia    Kidney stones    Lymphedema    Obesity    PCOS (polycystic ovarian syndrome)    Sleep apnea    Tachycardia    TMJ (dislocation of temporomandibular joint)     Past Surgical History:  Procedure Laterality  Date   BIOPSY  11/01/2022   Procedure: BIOPSY;  Surgeon: Jinny Carmine, MD;  Location: [facility];  Service: Endoscopy;;   BREAST BIOPSY Right 2016   neg   BREAST LUMPECTOMY WITH RADIOACTIVE SEED LOCALIZATION Right 08/02/2014   Procedure: BREAST LUMPECTOMY WITH RADIOACTIVE SEED LOCALIZATION;  Surgeon: Donnice Bury, MD;  Location: [facility] OR;  Service: General;  Laterality: Right;   COLONOSCOPY  08/12/2012   Completed by Dr. Luba, normal exam.    COLONOSCOPY WITH PROPOFOL  N/A 11/01/2022   Procedure: COLONOSCOPY WITH PROPOFOL ;  Surgeon: Jinny Carmine, MD;  Location: [facility] END[facility]COPY;  Service: Endoscopy;  Laterality: N/A;   CYSTOSCOPY W/ URETERAL STENT PLACEMENT Left 11/14/2023   Procedure: CYSTOSCOPY, WITH RETROGRADE PYELOGRAM AND URETERAL STENT INSERTION;  Surgeon: Georganne Penne SAUNDERS, MD;  Location: [facility] ORS;  Service: Urology;  Laterality: Left;   CYSTOSCOPY/URETEROSCOPY/HOLMIUM LASER/STENT PLACEMENT Left 12/03/2023   Procedure: CYSTOSCOPY/URETEROSCOPY/HOLMIUM LASER/STENT EXCHANGE;  Surgeon: Georganne Penne SAUNDERS, MD;  Location: [facility] ORS;  Service: Urology;  Laterality: Left;   CYSTOSCOPY/URETEROSCOPY/HOLMIUM LASER/STENT PLACEMENT Left 12/17/2023   Procedure: CYSTOSCOPY/URETEROSCOPY/HOLMIUM LASER/STENT EXCHANGE;  Surgeon: Georganne Penne SAUNDERS, MD;  Location: [facility] ORS;  Service: Urology;  Laterality: Left;   ENDOMETRIAL BIOPSY  01/2004   ESOPHAGOGASTRODUODENOSCOPY     ESOPHAGOGASTRODUODENOSCOPY (EGD) WITH PROPOFOL  N/A 08/18/2019   Procedure: ESOPHAGOGASTRODUODENOSCOPY (EGD) WITH PROPOFOL ;  Surgeon: Jinny Carmine, MD;  Location: [facility];  Service: Endoscopy;  Laterality: N/A;   ESOPHAGOGASTRODUODENOSCOPY (EGD) WITH PROPOFOL  N/A 11/01/2022   Procedure: ESOPHAGOGASTRODUODENOSCOPY (EGD) WITH PROPOFOL ;  Surgeon: Jinny Carmine, MD;  Location: [facility];  Service: Endoscopy;  Laterality: N/A;   FOOT SURGERY     SEPTOPLASTY     two times   TONSILLECTOMY     UTERINE FIBROID SURGERY  06/2005   ablation    uterine tumor  09/2001   VAGUS NERVE STIMULATOR INSERTION      Current Outpatient Medications  Medication Sig Dispense Refill   ACCU-CHEK GUIDE TEST test strip SMARTSIG:Strip(s)     Accu-Chek Softclix Lancets lancets SMARTSIG:Lancet Topical     acetaminophen  (TYLENOL ) 500 MG tablet Take 500 mg by mouth every 6 (six) hours as needed (pain.).     albuterol  (VENTOLIN  HFA) 108 (90 Base) MCG/ACT inhaler INHALE 2 PUFFS INTO THE LUNGS EVERY 6 HOURS AS NEEDED FOR WHEEZING. 18 g 1   ALPRAZolam  (XANAX ) 1 MG tablet Take 2 mg by mouth See admin instructions. Take 2 tablets (2 mg) by mouth scheduled at bedtime, may take 1 tablet (1 mg) by mouth up to twice daily if needed for anxiety.     ammonium lactate  (AMLACTIN) 12 % cream Apply 1 Application topically as needed for dry skin. 385 g 5   BD PEN NEEDLE NANO U/F 32G X 4 MM MISC daily.     Blood Glucose Monitoring Suppl (FIFTY50 GLUCOSE METER 2.0) w/Device KIT Use as directed     Continuous Glucose Sensor (FREESTYLE LIBRE 3 PLUS SENSOR) MISC      cyclobenzaprine  (FLEXERIL ) 5 MG tablet TAKE 1 TABLET BY MOUTH 3 TIMES DAILY AS NEEDED FOR MUSCLE SPASMS 90 tablet  0   desvenlafaxine (PRISTIQ) 50 MG 24 hr tablet Take 50 mg by mouth in the morning. (Patient taking differently: Take 100 mg by mouth in the morning.)     dimenhyDRINATE (DRAMAMINE) 50 MG tablet Take 50 mg by mouth every 8 (eight) hours as needed for nausea.     docusate sodium  (COLACE) 100 MG capsule TAKE ONE CAPSULE BY MOUTH TWICE DAILY. 180 capsule 1   famotidine  (PEPCID ) 20 MG tablet TAKE ONE TABLET BY MOUTH TWICE DAILY AS NEEDED FOR FOR HEARTBURN OR INDIGESTION 180 tablet 0   FLUoxetine  (PROZAC ) 20 MG capsule Take 20 mg by mouth in the morning.     fluticasone  (FLONASE ) 50 MCG/ACT nasal spray Place 1 spray into both nostrils daily as needed for allergies.     gabapentin  (NEURONTIN ) 100 MG capsule TAKE 1 CAPSULE BY MOUTH ONCE DAILY WITH BREAKFAST 90 capsule 1   gabapentin  (NEURONTIN ) 300 MG  capsule Take 1 capsule (300 mg total) by mouth at bedtime. 90 capsule 1   hydrocortisone  (ANUSOL -HC) 2.5 % rectal cream Apply to affected (hemorrhoid area) once daily at bedtime as needed for no more than 10 days in a row (Patient taking differently: Place 1 Application rectally at bedtime as needed for hemorrhoids.) 30 g 0   ibuprofen  (ADVIL ) 200 MG tablet Take 400 mg by mouth every 6 (six) hours as needed for headache or moderate pain (pain score 4-6).     ketoconazole  (NIZORAL ) 2 % shampoo Massage into scalp and let sit several minutes before rinsing. Use 2x/week. 120 mL 5   losartan (COZAAR) 25 MG tablet Take 25 mg by mouth in the morning.     minoxidil  (LONITEN ) 2.5 MG tablet Take 1 tablet (2.5 mg total) by mouth daily. 90 tablet 1   mometasone  (ELOCON ) 0.1 % cream Apply to affected area left thigh twice daily as needed for flares/itch. Avoid applying to face, groin, and axilla. Use as directed. Long-term use can cause thinning of the skin. 45 g 1   MOUNJARO 12.5 MG/0.5ML Pen Inject 12.5 mg into the skin every Sunday. (Patient taking differently: Inject 12.5 mg into the skin every Saturday.)     omeprazole  (PRILOSEC) 40 MG capsule Take 1 capsule (40 mg total) by mouth in the morning and at bedtime. 180 capsule 2   oxybutynin  (DITROPAN  XL) 15 MG 24 hr tablet Take 1 tablet (15 mg total) by mouth daily. 90 tablet 1   Plecanatide (TRULANCE) 3 MG TABS Take 1 tablet (3 mg total) by mouth daily. 30 tablet 12   Polyethyl Glycol-Propyl Glycol (SYSTANE OP) Place 1-2 drops into both eyes 3 (three) times daily as needed (dry/irritated eyes.).     pravastatin  (PRAVACHOL ) 20 MG tablet TAKE ONE TABLET BY MOUTH AT BEDTIME 90 tablet 1   Simethicone (GAS-X PO) Take 2 tablets by mouth as needed (gas).     spironolactone  (ALDACTONE ) 100 MG tablet TAKE 1 TABLET BY MOUTH DAILY 90 tablet 1   traMADol  (ULTRAM ) 50 MG tablet Take 1 tablet (50 mg total) by mouth every 12 (twelve) hours as needed for severe pain (pain score  7-10). Caution of sedation 30 tablet 0   traZODone  (DESYREL ) 150 MG tablet Take 300 mg by mouth at bedtime.     TRESIBA FLEXTOUCH 100 UNIT/ML FlexTouch Pen Inject 16 Units into the skin at bedtime.     zinc oxide (BALMEX) 11.3 % CREA cream Apply 1 application  topically 2 (two) times daily as needed (skin irritation/skin lesions.).  No current facility-administered medications for this visit.    Allergies as of 02/11/2024 - Review Complete 02/11/2024  Allergen Reaction Noted   Cephalexin  Diarrhea 10/16/2006   Codeine  10/16/2006   Duloxetine Other (See Comments) 10/16/2006   Levaquin  [levofloxacin ] Nausea Only 12/25/2011   Lisinopril Cough 01/16/2017   Metformin  and related Diarrhea 11/14/2011   Quetiapine Other (See Comments) 10/16/2006   Sertraline hcl     Triazolam  10/16/2006   Zaleplon  10/16/2006   Zocor [simvastatin - high dose]  07/05/2010   Zolpidem tartrate  10/16/2006   Zyrtec [cetirizine]  11/15/2021   Hydrocodone  Itching and Rash 10/16/2006   Norelgestromin-eth estradiol   10/16/2006    Family History  Problem Relation Age of Onset   Hypertension Father    Cancer Father        pancreatic cancer   Hypertension Sister    Heart disease Maternal Grandmother 67       MI   Diabetes Maternal Grandmother    Heart disease Paternal Grandmother 52       MI   Diabetes Paternal Grandmother    Breast cancer Neg Hx     Social History   Socioeconomic History   Marital status: Single    Spouse name: Not on file   Number of children: Not on file   Years of education: Not on file   Highest education level: Bachelor's degree (e.g., BA, AB, BS)  Occupational History   Not on file  Tobacco Use   Smoking status: Never   Smokeless tobacco: Never  Vaping Use   Vaping status: Never Used  Substance and Sexual Activity   Alcohol use: No    Alcohol/week: 0.0 standard drinks of alcohol   Drug use: No   Sexual activity: Not Currently    Birth control/protection: None,  Post-menopausal  Other Topics Concern   Not on file  Social History Narrative   Not on file   Social Drivers of Health   Tobacco Use: Low Risk (02/11/2024)   Patient History    Smoking Tobacco Use: Never    Smokeless Tobacco Use: Never    Passive Exposure: Not on file  Financial Resource Strain: Patient Declined (12/26/2023)   Overall Financial Resource Strain (CARDIA)    Difficulty of Paying Living Expenses: Patient declined  Food Insecurity: Patient Declined (12/26/2023)   Epic    Worried About Programme Researcher, Broadcasting/film/video in the Last Year: Patient declined    Barista in the Last Year: Patient declined  Transportation Needs: No Transportation Needs (12/26/2023)   Epic    Lack of Transportation (Medical): No    Lack of Transportation (Non-Medical): No  Physical Activity: Unknown (12/26/2023)   Exercise Vital Sign    Days of Exercise per Week: Patient declined    Minutes of Exercise per Session: Not on file  Recent Concern: Physical Activity - Insufficiently Active (11/11/2023)   Exercise Vital Sign    Days of Exercise per Week: 2 days    Minutes of Exercise per Session: 10 min  Stress: Stress Concern Present (12/26/2023)   [employer] of Occupational Health - Occupational Stress Questionnaire    Feeling of Stress: Rather much  Social Connections: Moderately Isolated (12/26/2023)   Social Connection and Isolation Panel    Frequency of Communication with Friends and Family: More than three times a week    Frequency of Social Gatherings with Friends and Family: Once a week    Attends Religious Services: 1 to 4 times  per year    Active Member of Clubs or Organizations: No    Attends Banker Meetings: Not on file    Marital Status: Never married  Intimate Partner Violence: Not At Risk (11/14/2023)   Epic    Fear of Current or Ex-Partner: No    Emotionally Abused: No    Physically Abused: No    Sexually Abused: No  Depression (PHQ2-9): High Risk (10/17/2023)    Depression (PHQ2-9)    PHQ-2 Score: 13  Alcohol Screen: Not on file  Housing: Low Risk (12/26/2023)   Epic    Unable to Pay for Housing in the Last Year: No    Number of Times Moved in the Last Year: 0    Homeless in the Last Year: No  Utilities: Not At Risk (11/14/2023)   Epic    Threatened with loss of utilities: No  Health Literacy: Adequate Health Literacy (11/07/2022)   B1300 Health Literacy    Frequency of need for help with medical instructions: Never     RELEVANT GI HISTORY, IMAGING AND LABS: CBC    Component Value Date/Time   WBC 6.5 11/22/2023 1326   RBC 4.70 11/22/2023 1326   HGB 15.1 (H) 11/22/2023 1326   HGB 14.8 05/21/2017 1559   HCT 45.6 11/22/2023 1326   HCT 44.0 05/21/2017 1559   PLT 229.0 11/22/2023 1326   PLT 254 05/21/2017 1559   MCV 97.0 11/22/2023 1326   MCV 93 05/21/2017 1559   MCV 96 06/05/2011 2203   MCH 33.1 11/15/2023 0453   MCHC 33.0 11/22/2023 1326   RDW 13.4 11/22/2023 1326   RDW 14.0 05/21/2017 1559   RDW 13.9 06/05/2011 2203   LYMPHSABS 1.0 11/22/2023 1326   MONOABS 0.6 11/22/2023 1326   EOSABS 0.2 11/22/2023 1326   BASOSABS 0.0 11/22/2023 1326   Recent Labs    11/14/23 1042 11/15/23 0453 11/22/23 1326  HGB 15.2* 12.1 15.1*    CMP     Component Value Date/Time   NA 142 11/22/2023 1326   NA 143 05/21/2017 1559   NA 142 06/05/2011 2203   K 4.6 11/22/2023 1326   K 3.8 06/05/2011 2203   CL 108 11/22/2023 1326   CL 106 06/05/2011 2203   CO2 28 11/22/2023 1326   CO2 21 06/05/2011 2203   GLUCOSE 107 (H) 11/22/2023 1326   GLUCOSE 204 (H) 06/05/2011 2203   BUN 12 11/22/2023 1326   BUN 10 05/21/2017 1559   BUN 13 06/05/2011 2203   CREATININE 0.89 11/22/2023 1326   CREATININE 1.13 06/05/2011 2203   CALCIUM 9.0 11/22/2023 1326   CALCIUM 9.0 06/05/2011 2203   PROT 6.5 11/22/2023 1326   PROT 6.7 05/21/2017 1559   PROT 6.9 06/05/2011 2203   ALBUMIN  4.4 11/22/2023 1326   ALBUMIN  4.5 05/21/2017 1559   ALBUMIN  3.2 (L)  06/05/2011 2203   AST 17 11/22/2023 1326   AST 22 06/05/2011 2203   ALT 21 11/22/2023 1326   ALT 17 06/05/2011 2203   ALKPHOS 69 11/22/2023 1326   ALKPHOS 51 06/05/2011 2203   BILITOT 0.4 11/22/2023 1326   BILITOT <0.2 05/21/2017 1559   BILITOT 0.2 06/05/2011 2203   GFRNONAA >60 11/15/2023 0453   GFRNONAA 59 (L) 06/05/2011 2203   GFRAA 72 05/21/2017 1559   GFRAA >60 06/05/2011 2203      Latest Ref Rng & Units 11/22/2023    1:26 PM 11/14/2023   10:42 AM 07/18/2022   12:52 PM  Hepatic Function  Total  Protein 6.0 - 8.3 g/dL 6.5  7.4  6.7   Albumin  3.5 - 5.2 g/dL 4.4  4.2  4.1   AST 0 - 37 U/L 17  22  12    ALT 0 - 35 U/L 21  16  9    Alk Phosphatase 39 - 117 U/L 69  73  73   Total Bilirubin 0.2 - 1.2 mg/dL 0.4  0.8  0.4   Bilirubin, Direct 0.0 - 0.3 mg/dL   0.1       Review of Systems   All systems reviewed and negative except where noted in HPI.    Physical Exam  BP 93/69   Pulse 76   Temp 97.7 F (36.5 C)   Ht 5' 4 (1.626 m)   Wt 210 lb (95.3 kg)   SpO2 98%   BMI 36.05 kg/m  No LMP recorded. Patient has had an ablation. General:   Alert and oriented. Pleasant and cooperative. Obese, well-developed. In no acute distress. Head:  Normocephalic and atraumatic. Eyes:  Without icterus Ears:  Normal auditory acuity. Lungs:  Respirations even and unlabored.  Clear throughout to auscultation.   No wheezes, crackles, or rhonchi. No acute distress. Heart:  Regular rate and rhythm; no murmurs, clicks, rubs, or gallops. Abdomen:  Normal bowel sounds.  No bruits.  Milld TTP LLQ, soft, non-tender and non-distended without masses, hepatosplenomegaly or hernias noted.  No guarding or rebound tenderness.   Rectal:  Deferred. Msk:  Symmetrical without gross deformities. Normal posture. Extremities:  Without edema. Neurologic:  Alert and  oriented x4;  grossly normal neurologically. Skin:  Intact without significant lesions or rashes. Psych:  Alert and cooperative. Normal mood and  affect.   Assessment & Plan   CAPTOLA TESCHNER is a 58 y.o. female presenting today with IBS-constipation.   IBS- Constipation.  Long discussion regarding constipation. Overall patient does not feel that Linzess  has improved bowel movements although has improved abdominal pain.  Patient reports she is having a lot of urinary issues as far as kidney stones and UTIs and hesitant to try any new medications.  She is also concerned if Mounjaro is causing the constipation and decreasing effectiveness of Linzess .  - Discussed trying another secretagogue, she is hesitant but will try Trulance.   - Recommended abdominal x-ray to check for stool burden but patient hesitant to do this also.  Instructed patient to try Trulance but if it does not seem to work we will then order an x-ray and do bowel purge if needed if there is stool burden.   GERD: Did not discuss during this visit.    I discussed the assessment and treatment plan with the patient. The patient was provided an opportunity to ask questions and all were answered. The patient agreed with the plan and demonstrated an understanding of the instructions.   The patient was advised to call back or seek an in-person evaluation if the symptoms worsen or if the condition fails to improve as anticipated.  Follow-up in 2 months  Grayce Bohr, DNP, AGNP-C [facility]  "

## 2024-02-11 ENCOUNTER — Telehealth: Payer: Self-pay

## 2024-02-11 ENCOUNTER — Encounter: Payer: Self-pay | Admitting: Family Medicine

## 2024-02-11 ENCOUNTER — Ambulatory Visit: Admitting: Family Medicine

## 2024-02-11 VITALS — BP 93/69 | HR 76 | Temp 97.7°F | Ht 64.0 in | Wt 210.0 lb

## 2024-02-11 DIAGNOSIS — K219 Gastro-esophageal reflux disease without esophagitis: Secondary | ICD-10-CM

## 2024-02-11 DIAGNOSIS — K581 Irritable bowel syndrome with constipation: Secondary | ICD-10-CM | POA: Diagnosis not present

## 2024-02-11 MED ORDER — TRULANCE 3 MG PO TABS: 3.0000 mg | ORAL_TABLET | Freq: Every day | ORAL | 12 refills | Status: AC

## 2024-02-11 NOTE — Patient Instructions (Signed)
 Will try Trulance 3 mg once a day. Trulance can be taken any time of the day, with or without food.

## 2024-02-11 NOTE — Progress Notes (Signed)
 "   Chief Complaint  Patient presents with   Diabetic Ulcer    Dfc. A1c 5.1    SUBJECTIVE Patient presents to office today complaining of elongated, thickened nails that cause pain while ambulating in shoes.  Patient is unable to trim their own nails.  Patient is also been experiencing some pain and tenderness associated to left great toe joint.  Idiopathic onset ongoing for few months now.  Intermittent painful toe depending on activity.  She also says it is more painful when she elevates her foot.  Patient is here for further evaluation and treatment.   Past Medical History:  Diagnosis Date   Allergy    allergic rhinitis   Anemia    Anxiety    Arthritis    osteoarthritis   Asthma    Claustrophobia    does not like oxygen mask covering face   Depression    Diabetes mellitus without complication (HCC)    Fatty liver 07/2008   abd. ultrasound - fatty liver ; slt dilated cbd (no stones ) 06/10// abd.  ultrasound normal on 04/2006   Fibromyalgia    GERD (gastroesophageal reflux disease)    EGD negative 06/2001// EGD erythematous mucosa, polyp 08/2008   High uric acid in 24 hour urine specimen    Hyperhidrosis    Hyperlipidemia    Kidney stones    Lymphedema    Obesity    PCOS (polycystic ovarian syndrome)    Sleep apnea    Tachycardia    TMJ (dislocation of temporomandibular joint)     Allergies  Allergen Reactions   Cephalexin  Diarrhea   Codeine     rash   Duloxetine Other (See Comments)    Vivid dreams   Levaquin  [Levofloxacin ] Nausea Only   Lisinopril Cough   Metformin  And Related Diarrhea    GI side effects    Quetiapine Other (See Comments)   Sertraline Hcl     REACTION: vivid dreams   Triazolam     REACTION: ? reaction   Zaleplon    Zocor [Simvastatin - High Dose]     achey   Zolpidem Tartrate     hallucinations   Zyrtec [Cetirizine]     Sedation    Hydrocodone  Itching and Rash    REACTION: redness rash itching   Norelgestromin-Eth Estradiol       Patch didn't stick to skin     OBJECTIVE General Patient is awake, alert, and oriented x 3 and in no acute distress. Derm Skin is dry and supple bilateral. Negative open lesions or macerations. Remaining integument unremarkable. Nails are tender, long, thickened and dystrophic with subungual debris, consistent with onychomycosis, 1-5 bilateral. No signs of infection noted. Vasc  DP and PT pedal pulses palpable bilaterally. Temperature gradient within normal limits.  Neuro grossly intact via light touch Musculoskeletal Exam No symptomatic pedal deformities noted bilateral. Muscular strength within normal limits.  There are some pain and tenderness associated to the plantar aspect of the left great toe joint  ASSESSMENT 1.  Pain due to onychomycosis of toenails both 2.  Capsulitis left great toe  PLAN OF CARE -Patient evaluated today.  -Instructed to maintain good pedal hygiene and foot care.  -Mechanical debridement of nails 1-5 bilaterally performed using a nail nipper. Filed with dremel without incident.  - Silicone toe caps were provided to apply to the great toes to alleviate pressure from the bilateral great toes -Continue wearing good supportive [place]oka and [employer]okes tennis shoes -Return to clinic in 3 mos.  Thresa EMERSON Sar, DPM [facility]  Dr. Thresa EMERSON Sar, DPM    [address] [place], [place] [address]                Office [phone]  Fax [phone]     "

## 2024-02-11 NOTE — Telephone Encounter (Signed)
 Received from [employer] approved for Trulance 3 mg -called [facility] and let them know as well as notiifed the patient.

## 2024-02-11 NOTE — Telephone Encounter (Signed)
"  error  "

## 2024-02-14 ENCOUNTER — Telehealth: Payer: Self-pay | Admitting: Urology

## 2024-02-14 NOTE — Telephone Encounter (Signed)
 Pt called and left VM requesting a call back from a CMA. She has some questions about the [facility] package that she needs to do.

## 2024-02-14 NOTE — Telephone Encounter (Signed)
 Spoke with pt and provided instructions for [facility] collection. Pt was instructed to begin the 24-hour urine collection on Sunday and return the sample the following day. Lab appointment has been scheduled. Patient is aware of the plan and verbalized understanding.

## 2024-02-17 ENCOUNTER — Other Ambulatory Visit

## 2024-02-17 DIAGNOSIS — N200 Calculus of kidney: Secondary | ICD-10-CM

## 2024-02-18 LAB — LITHOLINK SERUM PANEL
CO2: 24 mmol/L (ref 20–29)
Calcium: 10.3 mg/dL — ABNORMAL HIGH (ref 8.7–10.2)
Chloride: 104 mmol/L (ref 96–106)
Creatinine, Ser: 1.01 mg/dL — ABNORMAL HIGH (ref 0.57–1.00)
Magnesium: 2.1 mg/dL (ref 1.6–2.3)
Phosphorus: 4.5 mg/dL — ABNORMAL HIGH (ref 3.0–4.3)
Potassium: 4.7 mmol/L (ref 3.5–5.2)
Sodium: 143 mmol/L (ref 134–144)
Uric Acid: 4.2 mg/dL (ref 3.0–7.2)
eGFR: 65 mL/min/1.73

## 2024-02-22 LAB — LITHOLINK 24HR URINE PANEL
Ammonium, Urine: 31 mmol/(24.h) (ref 15–60)
Calcium Oxalate Saturation: 2.58 — ABNORMAL LOW (ref 6.00–10.00)
Calcium Phosphate Saturation: 0.11 — ABNORMAL LOW (ref 0.50–2.00)
Calcium, Urine: 151 mg/(24.h)
Calcium/Creatinine Ratio: 155 mg/g{creat} (ref 51–262)
Calcium/Kg Body Weight: 1.6 mg/kg/d
Chloride, Urine: 155 mmol/(24.h) (ref 70–250)
Citrate, Urine: 416 mg/(24.h) — ABNORMAL LOW
Creatinine, Urine: 972 mg/(24.h)
Creatinine/Kg Body Weight: 10.2 mg/kg/d (ref 8.7–20.3)
Cystine, Urine, Qualitative: NEGATIVE
Magnesium, Urine: 109 mg/(24.h) (ref 30–120)
Oxalate, Urine: 30 mg/(24.h) (ref 20–40)
Phosphorus, Urine: 505 mg/(24.h) — ABNORMAL LOW (ref 600–1200)
Potassium, Urine: 46 mmol/(24.h) (ref 20–100)
Protein Catabolic Rate: 0.7 g/kg/d — ABNORMAL LOW (ref 0.8–1.4)
Sodium, Urine: 148 mmol/(24.h) (ref 50–150)
Sulfate, Urine: 28 meq/(24.h) (ref 20–80)
Urea Nitrogen, Urine: 7.16 g/(24.h) (ref 6.00–14)
Uric Acid Saturation: 0.39
Uric Acid, Urine: 342 mg/(24.h)
Urine Volume (Preserved): 3140 mL/(24.h) (ref 500–4000)
pH, 24 hr, Urine: 5.677 — ABNORMAL LOW (ref 5.800–6.200)

## 2024-02-27 ENCOUNTER — Encounter: Attending: Family Medicine | Admitting: Dietician

## 2024-02-27 ENCOUNTER — Encounter: Payer: Self-pay | Admitting: Dietician

## 2024-02-27 DIAGNOSIS — Z713 Dietary counseling and surveillance: Secondary | ICD-10-CM | POA: Insufficient documentation

## 2024-02-27 DIAGNOSIS — Z794 Long term (current) use of insulin: Secondary | ICD-10-CM | POA: Insufficient documentation

## 2024-02-27 DIAGNOSIS — E119 Type 2 diabetes mellitus without complications: Secondary | ICD-10-CM | POA: Insufficient documentation

## 2024-02-27 DIAGNOSIS — Z7985 Long-term (current) use of injectable non-insulin antidiabetic drugs: Secondary | ICD-10-CM | POA: Diagnosis not present

## 2024-02-27 NOTE — Progress Notes (Signed)
 "    Diabetes Self-Management Education  Visit Type: Follow-up  Appt. Start Time: 1400 Appt. End Time: 1450  02/27/2024  Ms. Sabrin Dunlevy, identified by name and date of birth, is a 59 y.o. female with a diagnosis of Diabetes:  .   ASSESSMENT Pt reports using Freestyle Libre3 CGM: CGM Results from download:   % Time CGM active:   100 %   (Goal >70%)  Average glucose:   91 mg/dL for 90 days  Time in range (70-180 mg/dL):   98 %   (Goal >29%)  Time High (181-250 mg/dL):   1 %   (Goal < 74%)  Time Very High (>250 mg/dL):    0 %   (Goal < 5%)  Time Low (54-69 mg/dL):   1 %   (Goal <5%)  Time Very Low (<54 mg/dL):   0 %   (Goal <8%)  Daily patterns indicate glucose has remained stable between 80-100 mg/dL, with less frequent lows.  Pt reports continuing Mounjaro @12 .5 mg weekly and Tresiba @16u  at night, less frequent hypoglycemia. Pt reports difficulty with cravings and seasonal depression since the holiday season, reports confusion about dosage of antidepressant medication, states they were intermittently taking an incorrect dose. Pt reports feeling spaced out and having poor memory/recall. Pt reports they are continuing to cook more at home, but still getting premade meals on a regular basis. Pt reports difficulty with diarrhea/constipation, reports switching medications for this with some relief.   Diabetes Self-Management Education - 02/27/24 1500       Visit Information   Visit Type Follow-up      Pre-Education Assessment   Patient understands the diabetes disease and treatment process. Needs Review    Patient understands incorporating nutritional management into lifestyle. Needs Review    Patient undertands incorporating physical activity into lifestyle. Needs Review    Patient understands using medications safely. Needs Review    Patient understands monitoring blood glucose, interpreting and using results Needs Review    Patient understands prevention, detection, and  treatment of acute complications. Needs Review    Patient understands prevention, detection, and treatment of chronic complications. Needs Review    Patient understands how to develop strategies to address psychosocial issues. Needs Review    Patient understands how to develop strategies to promote health/change behavior. Needs Review      Complications   How often do you check your blood sugar? > 4 times/day    Fasting Blood glucose range (mg/dL) 29-870;<29    Postprandial Blood glucose range (mg/dL) 869-820;29-870      Activity / Exercise   Activity / Exercise Type ADL's      Patient Education   Disease Pathophysiology Explored patient's options for treatment of their diabetes    Healthy Eating Role of diet in the treatment of diabetes and the relationship between the three main macronutrients and blood glucose level;Reviewed blood glucose goals for pre and post meals and how to evaluate the patients' food intake on their blood glucose level.    Medications Reviewed patients medication for diabetes, action, purpose, timing of dose and side effects.    Monitoring Taught/evaluated CGM (comment)   TIR   Acute complications Taught prevention, symptoms, and  treatment of hypoglycemia - the 15 rule.    Chronic complications Nephropathy, what it is, prevention of, the use of ACE, ARB's and early detection of through urine microalbumia.    Diabetes Stress and Support Identified and addressed patients feelings and concerns about diabetes;Brainstormed with patient  on coping mechanisms for social situations, getting support from significant others, dealing with feelings about diabetes    Lifestyle and Health Coping Lifestyle issues that need to be addressed for better diabetes care   Fear of glucose elevation     Individualized Goals (developed by patient)   Nutrition Follow meal plan discussed    Medications take my medication as prescribed    Monitoring  Consistenly use CGM    Problem Solving  Eating Pattern    Reducing Risk examine blood glucose patterns    Health Coping Discuss barriers to diabetes care with support person/system (comment specifics as needed)      Patient Self-Evaluation of Goals - Patient rates self as meeting previously set goals (% of time)   Nutrition 50 - 75 % (half of the time)    Physical Activity < 25% (hardly ever/never)    Medications >75% (most of the time)    Monitoring >75% (most of the time)    Problem Solving and behavior change strategies  25 - 50% (sometimes)    Reducing Risk (treating acute and chronic complications) 25 - 50% (sometimes)    Health Coping < 25% (hardly ever/never)      Post-Education Assessment   Patient understands the diabetes disease and treatment process. Needs Review    Patient understands incorporating nutritional management into lifestyle. Comprehends key points    Patient undertands incorporating physical activity into lifestyle. Needs Review    Patient understands using medications safely. Comphrehends key points    Patient understands monitoring blood glucose, interpreting and using results Comprehends key points    Patient understands prevention, detection, and treatment of acute complications. Comprehends key points    Patient understands prevention, detection, and treatment of chronic complications. Needs Review    Patient understands how to develop strategies to address psychosocial issues. Needs Review    Patient understands how to develop strategies to promote health/change behavior. Needs Review      Outcomes   Expected Outcomes Demonstrated interest in learning but significant barriers to change    Future DMSE 2 months    Program Status Not Completed      Subsequent Visit   Since your last visit have you continued or begun to take your medications as prescribed? Yes    Since your last visit have you had your blood pressure checked? Yes    Is your most recent blood pressure lower, unchanged, or higher  since your last visit? Unchanged    Since your last visit, are you checking your blood glucose at least once a day? Yes   CGM         Individualized Plan for Diabetes Self-Management Training:   Learning Objective:  Patient will have a greater understanding of diabetes self-management. Patient education plan is to attend individual and/or group sessions per assessed needs and concerns.   Plan:   Patient Instructions  Add fresh lemon juice to your water  to increase your citric acid.  Eat 2 servings of fruit daily. Have 1 for a snack in the morning and the afternoon.  Have a bowl of sugar-free black cherry Jello each day for extra fluids!  Expected Outcomes:  Demonstrated interest in learning but significant barriers to change  Education material provided: Snack ideas, Fruits list  If problems or questions, patient to contact team via:  Phone and Email  Future DSME appointment: 2 months "

## 2024-02-27 NOTE — Patient Instructions (Addendum)
 Add fresh lemon juice to your water  to increase your citric acid.  Eat 2 servings of fruit daily. Have 1 for a snack in the morning and the afternoon.  Have a bowl of sugar-free black cherry Jello each day for extra fluids!

## 2024-02-28 ENCOUNTER — Telehealth: Payer: Self-pay | Admitting: *Deleted

## 2024-02-28 NOTE — Telephone Encounter (Signed)
 Copied from CRM #[id number]. Topic: Clinical - Lab/Test Results >> Feb 28, 2024  3:27 PM Mackenzie Bonilla wrote: Reason for CRM: Patient called in regarding her labs from  Dr.Solum , Endocrinologist  Patient called in wanted to know if Dr.Tower could look at those results,  Patient wanted to know if Dr.Tower could prescribed her something for her UTI   Would like a callback  [phone]

## 2024-02-28 NOTE — Telephone Encounter (Signed)
 I'm seeing patients Please get some details  What labs? Are they visible in epic?  What urine symptoms ? Was she dx with uti or not yet (I cannot treat without seeing)

## 2024-02-28 NOTE — Telephone Encounter (Signed)
 Called pt and she said she had labs done for her endo doc and while she was there today she asked them to do a UA because her urine is cloudy. They talked to Dr. Damian who ordered test and pt said it came back positive. I can't see any results in epic/care everywhere. Pt advise that usually the ordering doctor treats the UTI since Dr. Damian ordered it is she going to treat it. Pt said she called them and they advise her Dr. Damian isn't in the office today and it may be next week when she reviews her labs. Pt advise we can't treat her for a UTI without an appt. I also asked her did she reach out to urologist. Pt said she hasn't. Pt advise if UTI sxs are present given it's Friday afternoon she should go to her local UC for eval of UTI. Pt verbalized understanding. I still encouraged pt to reach out to urologist to let them know she is still having UTI sxs. Also encouraged pt reach out to Dr. Damian Monday to see if she has any recommendations based off of Urine test results

## 2024-03-01 NOTE — Telephone Encounter (Signed)
 Aware, agree with that advisement  I have no results to look at

## 2024-03-03 ENCOUNTER — Ambulatory Visit: Admitting: Podiatry

## 2024-03-03 DIAGNOSIS — M65971 Unspecified synovitis and tenosynovitis, right ankle and foot: Secondary | ICD-10-CM

## 2024-03-03 DIAGNOSIS — M65972 Unspecified synovitis and tenosynovitis, left ankle and foot: Secondary | ICD-10-CM | POA: Diagnosis not present

## 2024-03-03 MED ORDER — BETAMETHASONE SOD PHOS & ACET 6 (3-3) MG/ML IJ SUSP
3.0000 mg | Freq: Once | INTRAMUSCULAR | Status: AC
  Administered 2024-03-03: 3 mg via INTRA_ARTICULAR

## 2024-03-03 NOTE — Progress Notes (Signed)
 "  Chief Complaint  Patient presents with   Toe Pain    Big toe pain on right foot. She states there is tingling all around the nail. She thinks there is a nerve that you could kill     HPI: 59 y.o. femalepresenting for evaluation of bilateral great toe pain  Past Medical History:  Diagnosis Date   Allergy    allergic rhinitis   Anemia    Anxiety    Arthritis    osteoarthritis   Asthma    Claustrophobia    does not like oxygen mask covering face   Depression    Diabetes mellitus without complication (HCC)    Fatty liver 07/2008   abd. ultrasound - fatty liver ; slt dilated cbd (no stones ) 06/10// abd.  ultrasound normal on 04/2006   Fibromyalgia    GERD (gastroesophageal reflux disease)    EGD negative 06/2001// EGD erythematous mucosa, polyp 08/2008   High uric acid in 24 hour urine specimen    Hyperhidrosis    Hyperlipidemia    Kidney stones    Lymphedema    Obesity    PCOS (polycystic ovarian syndrome)    Sleep apnea    Tachycardia    TMJ (dislocation of temporomandibular joint)     Past Surgical History:  Procedure Laterality Date   BIOPSY  11/01/2022   Procedure: BIOPSY;  Surgeon: Jinny Carmine, MD;  Location: [facility];  Service: Endoscopy;;   BREAST BIOPSY Right 2016   neg   BREAST LUMPECTOMY WITH RADIOACTIVE SEED LOCALIZATION Right 08/02/2014   Procedure: BREAST LUMPECTOMY WITH RADIOACTIVE SEED LOCALIZATION;  Surgeon: Donnice Bury, MD;  Location: [facility] OR;  Service: General;  Laterality: Right;   COLONOSCOPY  08/12/2012   Completed by Dr. Luba, normal exam.    COLONOSCOPY WITH PROPOFOL  N/A 11/01/2022   Procedure: COLONOSCOPY WITH PROPOFOL ;  Surgeon: Jinny Carmine, MD;  Location: [facility];  Service: Endoscopy;  Laterality: N/A;   CYSTOSCOPY W/ URETERAL STENT PLACEMENT Left 11/14/2023   Procedure: CYSTOSCOPY, WITH RETROGRADE PYELOGRAM AND URETERAL STENT INSERTION;  Surgeon: Georganne Penne SAUNDERS, MD;  Location: [facility] ORS;  Service: Urology;   Laterality: Left;   CYSTOSCOPY/URETEROSCOPY/HOLMIUM LASER/STENT PLACEMENT Left 12/03/2023   Procedure: CYSTOSCOPY/URETEROSCOPY/HOLMIUM LASER/STENT EXCHANGE;  Surgeon: Georganne Penne SAUNDERS, MD;  Location: [facility];  Service: Urology;  Laterality: Left;   CYSTOSCOPY/URETEROSCOPY/HOLMIUM LASER/STENT PLACEMENT Left 12/17/2023   Procedure: CYSTOSCOPY/URETEROSCOPY/HOLMIUM LASER/STENT EXCHANGE;  Surgeon: Georganne Penne SAUNDERS, MD;  Location: [facility];  Service: Urology;  Laterality: Left;   ENDOMETRIAL BIOPSY  01/2004   ESOPHAGOGASTRODUODENOSCOPY     ESOPHAGOGASTRODUODENOSCOPY (EGD) WITH PROPOFOL  N/A 08/18/2019   Procedure: ESOPHAGOGASTRODUODENOSCOPY (EGD) WITH PROPOFOL ;  Surgeon: Jinny Carmine, MD;  Location: [facility];  Service: Endoscopy;  Laterality: N/A;   ESOPHAGOGASTRODUODENOSCOPY (EGD) WITH PROPOFOL  N/A 11/01/2022   Procedure: ESOPHAGOGASTRODUODENOSCOPY (EGD) WITH PROPOFOL ;  Surgeon: Jinny Carmine, MD;  Location: [facility];  Service: Endoscopy;  Laterality: N/A;   FOOT SURGERY     SEPTOPLASTY     two times   TONSILLECTOMY     UTERINE FIBROID SURGERY  06/2005   ablation   uterine tumor  09/2001   VAGUS NERVE STIMULATOR INSERTION      Allergies[1]   Physical Exam: General: The patient is alert and oriented x3 in no acute distress.  Dermatology: Skin is warm, dry and supple bilateral lower extremities.   Vascular: Palpable pedal pulses bilaterally. Capillary refill within normal limits.  No appreciable edema.  No erythema.  Neurological: Grossly intact via light touch  Musculoskeletal Exam: No pedal deformities noted.  There is some tenderness with palpation on the bilateral great toes  Assessment/Plan of Care: 1.  Capsulitis bilateral great toes  -Injection of 0.5 cc Celestone  Soluspan injected around the IPJ of the bilateral great toes -Light debridement of the toenails was also performed today using a nail nipper as a courtesy for the patient.  She did feel some relief with  this -Recommend wide fitting shoes that do not irritate or constrict the toebox area -Return to clinic next scheduled appointment for routine footcare     Thresa EMERSON Sar, DPM [facility]  Dr. Thresa EMERSON Sar, DPM    [address] [place] [address]                Office [phone]  Fax [phone]        [1]  Allergies Allergen Reactions   Cephalexin  Diarrhea   Codeine     rash   Duloxetine Other (See Comments)    Vivid dreams   Levaquin  [Levofloxacin ] Nausea Only   Lisinopril Cough   Metformin  And Related Diarrhea    GI side effects    Quetiapine Other (See Comments)   Sertraline Hcl     REACTION: vivid dreams   Triazolam     REACTION: ? reaction   Zaleplon    Zocor [Simvastatin - High Dose]     achey   Zolpidem Tartrate     hallucinations   Zyrtec [Cetirizine]     Sedation    Hydrocodone  Itching and Rash    REACTION: redness rash itching   Norelgestromin-Eth Estradiol      Patch didn't stick to skin   "

## 2024-03-04 ENCOUNTER — Other Ambulatory Visit: Payer: Self-pay | Admitting: Family Medicine

## 2024-03-05 NOTE — Telephone Encounter (Signed)
 Will route to PCP for review. Not sure if PCP wants to continue filling med or route to new GI provider. Please advise

## 2024-03-16 ENCOUNTER — Ambulatory Visit: Payer: Self-pay

## 2024-03-16 NOTE — Telephone Encounter (Signed)
 FYI Only or Action Required?: Action required by provider: clinical question for provider.  Patient was last seen in primary care on 12/27/2023 by Mackenzie Greig BRAVO, MD.  Called Nurse Triage reporting Open Wound.  Symptoms began yesterday.  Interventions attempted: OTC medications: antibiotic ointment.  Symptoms are: unchanged.  Triage Disposition: Home Care  Patient/caregiver understands and will follow disposition?: No, wishes to speak with PCP  Reason for Disposition  Wound doesn't sound infected  Answer Assessment - Initial Assessment Questions Patient calling to report open wound to the right inner crease between the pubic area and the upper thigh - 1.5 inches long and looks like a really bad scratch. Wound hurts to the touch, red, irritated, raw - onset 03/15/24. Pt using antibiotic ointment and is not being covered with a bandage. Pt states she has lost over 100lbs and these wounds in the crease have been a frequent issue d/t the excess skin rubbing together.  Pt advised home care per protocol and to keep area clean and dry, and to cover wound if possible. Pt would like to know if PCP has any further recommendations for treating wound.   1. LOCATION: Where is the wound located?      right inner crease between the pubic area and the upper thigh  2. WOUND APPEARANCE: What does the wound look like?      Looks like a really bad scratch 3. SIZE: If redness is present, ask: What is the size of the red area? (Inches, centimeters, or compare to size of a coin)      1/5inches 4. SPREAD: What's changed in the last day?  Do you see any red streaks coming from the wound?     Denies  5. ONSET: When did it start to look infected?      Pt states it does not look infected yet.  6. MECHANISM: How did the wound start, what was the cause?     From skin rubbing together  7. PAIN: Is there any pain? If Yes, ask: How bad is the pain?   (Scale 1-10; or mild, moderate, severe)      Only pain when it rubs or being touched.  8. FEVER: Do you have a fever? If Yes, ask: What is your temperature, how was it measured, and when did it start?     Denies  9. OTHER SYMPTOMS: Do you have any other symptoms? (e.g., shaking chills, weakness, rash elsewhere on body)     N/a  10. PREGNANCY: Is there any chance you are pregnant? When was your last menstrual period?       N/a  Protocols used: Wound Infection-A-AH  Reason for Triage: Patient has been getting lesions on her right thigh. The one she has now is about an inch long. She has been applying an antibiotic to the area and needs some advice on what to do. Lesion is causing pain.

## 2024-03-16 NOTE — Telephone Encounter (Signed)
 Called patient to offer appointment to be seen in office. She is not able to get in due to weather. She denies any fever, nausea or vomiting. If any signs of infections she will be seen ASAP. She will reach out to our office if any further questions. Reviewed ways to keep area clean and dry. Advised her to avoid any rubbing or directly applying soaps to the open area.

## 2024-03-16 NOTE — Telephone Encounter (Signed)
 She gets these periodically  I usually recommend keeping very clean with soap/water   Dry well Use any barrier cream/ointment  until healed If signs and symptoms of infection-please schedule in office visit when she can get here  Thanks

## 2024-03-17 NOTE — Telephone Encounter (Signed)
 Called patient states that when she lift skin to clean and it has become very painful. She is feeling a little off not eating much and very tired. States she gets like this from time to time. Nothing new or changed. She states that she is still not able to get out for visit. Reviewed with patient all the signes of infection and how important it is getting evaluated if any. She agreed and will give the office a call if any changes or new symptoms.

## 2024-03-24 ENCOUNTER — Ambulatory Visit: Admitting: Physician Assistant

## 2024-03-25 ENCOUNTER — Ambulatory Visit: Admitting: Physician Assistant

## 2024-03-26 ENCOUNTER — Ambulatory Visit: Admitting: Family Medicine

## 2024-04-28 ENCOUNTER — Ambulatory Visit: Admitting: Podiatry

## 2024-04-29 ENCOUNTER — Encounter: Admitting: Dietician

## 2024-06-23 ENCOUNTER — Ambulatory Visit: Admitting: Dermatology
